# Patient Record
Sex: Female | Born: 1965 | Race: Black or African American | Hispanic: No | Smoking: Never smoker
Health system: Southern US, Community
[De-identification: ages and names within clinical notes are randomized; demographics above are authoritative.]

## PROBLEM LIST (undated history)

## (undated) DIAGNOSIS — E669 Obesity, unspecified: Secondary | ICD-10-CM

## (undated) DIAGNOSIS — D150 Benign neoplasm of thymus: Secondary | ICD-10-CM

## (undated) DIAGNOSIS — J40 Bronchitis, not specified as acute or chronic: Secondary | ICD-10-CM

## (undated) DIAGNOSIS — I10 Essential (primary) hypertension: Secondary | ICD-10-CM

## (undated) DIAGNOSIS — D573 Sickle-cell trait: Secondary | ICD-10-CM

## (undated) DIAGNOSIS — I509 Heart failure, unspecified: Secondary | ICD-10-CM

## (undated) DIAGNOSIS — E7800 Pure hypercholesterolemia, unspecified: Secondary | ICD-10-CM

## (undated) DIAGNOSIS — D72829 Elevated white blood cell count, unspecified: Secondary | ICD-10-CM

## (undated) DIAGNOSIS — D649 Anemia, unspecified: Secondary | ICD-10-CM

## (undated) DIAGNOSIS — D4989 Neoplasm of unspecified behavior of other specified sites: Secondary | ICD-10-CM

## (undated) HISTORY — DX: Sickle-cell trait: D57.3

## (undated) HISTORY — PX: TUBAL LIGATION: SHX77

## (undated) HISTORY — DX: Elevated white blood cell count, unspecified: D72.829

## (undated) HISTORY — DX: Neoplasm of unspecified behavior of other specified sites: D49.89

## (undated) HISTORY — DX: Obesity, unspecified: E66.9

## (undated) HISTORY — DX: Anemia, unspecified: D64.9

## (undated) HISTORY — DX: Essential (primary) hypertension: I10

## (undated) HISTORY — DX: Benign neoplasm of thymus: D15.0

## (undated) HISTORY — PX: TONSILLECTOMY: SUR1361

---

## 1997-06-12 ENCOUNTER — Encounter: Admission: RE | Admit: 1997-06-12 | Discharge: 1997-06-12 | Payer: Self-pay | Admitting: *Deleted

## 1997-09-08 ENCOUNTER — Inpatient Hospital Stay (HOSPITAL_COMMUNITY): Admission: AD | Admit: 1997-09-08 | Discharge: 1997-09-08 | Payer: Self-pay | Admitting: Obstetrics

## 1998-01-05 ENCOUNTER — Inpatient Hospital Stay (HOSPITAL_COMMUNITY): Admission: AD | Admit: 1998-01-05 | Discharge: 1998-01-05 | Payer: Self-pay | Admitting: Obstetrics

## 2011-04-16 ENCOUNTER — Encounter (HOSPITAL_COMMUNITY): Payer: Self-pay | Admitting: *Deleted

## 2011-04-16 ENCOUNTER — Other Ambulatory Visit: Payer: Self-pay

## 2011-04-16 ENCOUNTER — Emergency Department (HOSPITAL_COMMUNITY)
Admission: EM | Admit: 2011-04-16 | Discharge: 2011-04-16 | Disposition: A | Discharge status: Home / Self Care | Payer: Self-pay | Source: Home / Self Care | Attending: Emergency Medicine | Admitting: Emergency Medicine

## 2011-04-16 DIAGNOSIS — I498 Other specified cardiac arrhythmias: Secondary | ICD-10-CM

## 2011-04-16 DIAGNOSIS — J069 Acute upper respiratory infection, unspecified: Secondary | ICD-10-CM

## 2011-04-16 DIAGNOSIS — R000 Tachycardia, unspecified: Secondary | ICD-10-CM

## 2011-04-16 HISTORY — DX: Pure hypercholesterolemia, unspecified: E78.00

## 2011-04-16 MED ORDER — GUAIFENESIN-CODEINE 100-10 MG/5ML PO SYRP: 5.0000 mL | ORAL_SOLUTION | Freq: Three times a day (TID) | ORAL | Status: AC | PRN

## 2011-04-16 NOTE — Discharge Instructions (Signed)
If your respiratory symptoms persist beyond 7 days return for recheck or followup with her primary care Dr. would encourage you to recheck your pulse as instructed today and if still above 110 followup with your primary care doctor in the near future. If any chest pain dizziness palpitation should go to the emergency department for further evaluation

## 2011-04-16 NOTE — ED Provider Notes (Signed)
History     CSN: [id number]  Arrival date & time 04/16/11  4098   First MD Initiated Contact with Patient 04/16/11 [phone]      Chief Complaint  Patient presents with  . Sore Throat  . Cough    (Consider location/radiation/quality/duration/timing/severity/associated sxs/prior treatment) Patient is a 46 y.o. female presenting with pharyngitis and cough. The history is provided by the patient.  Sore Throat This is a new problem. The current episode started 2 days ago. The problem occurs hourly. The problem has not changed since onset.Associated symptoms include headaches. Pertinent negatives include no chest pain, no abdominal pain and no shortness of breath. The symptoms are relieved by nothing. Treatments tried: otc claritin. The treatment provided no relief.  Cough Associated symptoms include headaches. Pertinent negatives include no chest pain, no chills, no shortness of breath and no wheezing.    Past Medical History  Diagnosis Date  . Diabetes mellitus   . High cholesterol     History reviewed. No pertinent past surgical history.  History reviewed. No pertinent family history.  History  Substance Use Topics  . Smoking status: Never Smoker   . Smokeless tobacco: Not on file  . Alcohol Use: No    OB History    Grav Para Term Preterm Abortions TAB SAB Ect Mult Living                  Review of Systems  Constitutional: Positive for appetite change. Negative for chills, activity change and fatigue.  Respiratory: Positive for cough and chest tightness. Negative for shortness of breath and wheezing.   Cardiovascular: Negative for chest pain and palpitations.  Gastrointestinal: Negative for vomiting, abdominal pain, constipation, blood in stool and rectal pain.  Genitourinary: Negative for dysuria.  Neurological: Positive for headaches.    Allergies  Augmentin  Home Medications   Current Outpatient Rx  Name Route Sig Dispense Refill  . FERROUS FUMARATE 325 (106  FE) MG PO TABS Oral Take 1 tablet by mouth.    Marland Kitchen LISINOPRIL 10 MG PO TABS Oral Take 10 mg by mouth daily.    Marland Kitchen METFORMIN HCL 500 MG PO TABS Oral Take 500 mg by mouth 2 (two) times daily with a meal.    . ONE-DAILY MULTI VITAMINS PO TABS Oral Take 1 tablet by mouth daily.    . GUAIFENESIN-CODEINE 100-10 MG/5ML PO SYRP Oral Take 5 mLs by mouth 3 (three) times daily as needed for cough. 120 mL 0    BP 135/64  Pulse 116  Temp(Src) 98.4 F (36.9 C) (Oral)  Resp 20  SpO2 97%  LMP 04/06/2011  Physical Exam  Nursing note and vitals reviewed. Constitutional: She appears well-developed and well-nourished. No distress.  HENT:  Head: Normocephalic.  Right Ear: Tympanic membrane normal.  Left Ear: Tympanic membrane normal.  Nose: Rhinorrhea present.  Mouth/Throat: Uvula is midline. Posterior oropharyngeal erythema present.  Eyes: Conjunctivae and EOM are normal.  Neck: Neck supple. No JVD present.  Cardiovascular: Regular rhythm and S1 normal.   No extrasystoles are present. Tachycardia present.  PMI is not displaced.   Pulmonary/Chest: Effort normal. She has no decreased breath sounds. She has no wheezes.  Lymphadenopathy:    She has no cervical adenopathy.    ED Course  Procedures (including critical care time)  Labs Reviewed - No data to display No results found.   1. Upper respiratory infection   2. Sinus tachycardia       MDM  Patient presents urgent care  with respiratory symptoms for 2 days. Denies fevers or shortness of breath or chest pains incidentally is noted that she is persistently tachycardic between 115 and 120 further evaluation taking place. Sinus epicardium patient denies any palpitations, shortness of breath, presyncopal or chest pains.        Jimmie Molly, MD 04/16/11 1106

## 2011-04-16 NOTE — ED Notes (Signed)
Pt is here with complaints of cough, sore throat, HA and sneezing x 2 days.  Denies fever.

## 2011-07-01 ENCOUNTER — Telehealth: Payer: Self-pay | Admitting: Oncology

## 2011-07-01 NOTE — Telephone Encounter (Signed)
called pts home lmovm to rtn call to scheduled appt

## 2011-07-02 ENCOUNTER — Telehealth: Payer: Self-pay | Admitting: Oncology

## 2011-07-02 NOTE — Telephone Encounter (Signed)
called pt lmovm to rtn call to schedule appt 

## 2011-07-05 ENCOUNTER — Telehealth: Payer: Self-pay | Admitting: Oncology

## 2011-07-05 NOTE — Telephone Encounter (Signed)
called pt and scheduled appt for 07/09. faxed over a letter to Dr. Doyce Loose with appt d/t

## 2011-07-05 NOTE — Telephone Encounter (Signed)
Referred by Dr. Mathis Bud Dx- Leukocytosis/Anemia

## 2011-07-16 ENCOUNTER — Other Ambulatory Visit: Payer: Self-pay | Admitting: Oncology

## 2011-07-16 DIAGNOSIS — D72829 Elevated white blood cell count, unspecified: Secondary | ICD-10-CM

## 2011-07-20 ENCOUNTER — Other Ambulatory Visit (HOSPITAL_COMMUNITY)
Admission: RE | Admit: 2011-07-20 | Discharge: 2011-07-20 | Disposition: A | Discharge status: Home / Self Care | Payer: BC Managed Care – PPO | Source: Ambulatory Visit | Attending: Oncology | Admitting: Oncology

## 2011-07-20 ENCOUNTER — Ambulatory Visit (HOSPITAL_BASED_OUTPATIENT_CLINIC_OR_DEPARTMENT_OTHER): Payer: BC Managed Care – PPO | Admitting: Oncology

## 2011-07-20 ENCOUNTER — Telehealth: Payer: Self-pay | Admitting: Oncology

## 2011-07-20 ENCOUNTER — Ambulatory Visit: Payer: BC Managed Care – PPO

## 2011-07-20 ENCOUNTER — Other Ambulatory Visit (HOSPITAL_BASED_OUTPATIENT_CLINIC_OR_DEPARTMENT_OTHER): Payer: BC Managed Care – PPO | Admitting: Lab

## 2011-07-20 VITALS — BP 113/63 | HR 92 | Temp 97.4°F | Ht 69.0 in | Wt 320.7 lb

## 2011-07-20 DIAGNOSIS — D649 Anemia, unspecified: Secondary | ICD-10-CM

## 2011-07-20 DIAGNOSIS — D72829 Elevated white blood cell count, unspecified: Secondary | ICD-10-CM

## 2011-07-20 DIAGNOSIS — E119 Type 2 diabetes mellitus without complications: Secondary | ICD-10-CM

## 2011-07-20 DIAGNOSIS — D72820 Lymphocytosis (symptomatic): Secondary | ICD-10-CM

## 2011-07-20 DIAGNOSIS — R718 Other abnormality of red blood cells: Secondary | ICD-10-CM

## 2011-07-20 LAB — CHCC SMEAR

## 2011-07-20 LAB — CBC WITH DIFFERENTIAL/PLATELET
Basophils Absolute: 0.1 10*3/uL (ref 0.0–0.1)
Eosinophils Absolute: 0.1 10*3/uL (ref 0.0–0.5)
HCT: 35.6 % (ref 34.8–46.6)
HGB: 11.6 g/dL (ref 11.6–15.9)
MONO#: 0.6 10*3/uL (ref 0.1–0.9)
NEUT%: 42 % (ref 38.4–76.8)
WBC: 16.1 10*3/uL — ABNORMAL HIGH (ref 3.9–10.3)
lymph#: 8.5 10*3/uL — ABNORMAL HIGH (ref 0.9–3.3)

## 2011-07-20 LAB — COMPREHENSIVE METABOLIC PANEL
ALT: 20 U/L (ref 0–35)
BUN: 16 mg/dL (ref 6–23)
CO2: 28 mEq/L (ref 19–32)
Calcium: 9.2 mg/dL (ref 8.4–10.5)
Chloride: 104 mEq/L (ref 96–112)
Creatinine, Ser: 0.75 mg/dL (ref 0.50–1.10)

## 2011-07-20 LAB — TECHNOLOGIST REVIEW

## 2011-07-20 MED ORDER — FERROUS FUMARATE 325 (106 FE) MG PO TABS: 1.0000 | ORAL_TABLET | Freq: Every day | ORAL | Status: DC

## 2011-07-20 NOTE — Progress Notes (Signed)
CC:   Joselyn Glassman, MD  REASON FOR CONSULTATION:  Leukocytosis and anemia.  HISTORY OF PRESENT ILLNESS:  Ms. Constantino is a pleasant 46 year old African American woman with past medical history significant for diabetes and hyperlipidemia as well as obesity.  She has been diagnosed with iron deficiency anemia since her 1st pregnancy and had been on and off an iron supplementation.  She has stopped taking it for the last 4 months. I am not sure exactly why.  She reports that she went in to refill her iron and there was no refill and she was told that she does not really need it.  She did report some constipation associated with the iron tablets but otherwise did not really have any major problems.  She does not have any major symptoms at this time.  She has had recently laboratory data checked as part of her routine followup with her primary care physician, and she was noted to have white cell count of 14.5.  Her hemoglobin was 12.  Her MCV is 77, which is low.  Her differential at that time showed she had a lymphocytosis of 58% but otherwise the differential was unremarkable.  Looking back previously in January of 2013 her white cell count was 14.6.  In February of 2012 it was 13.1 with a lymphocyte percentage of 45.  The patient otherwise had not reported any illness or hospitalization.  She did have sinus pressure and infection requiring antibiotics but no steroid intake.  She did not have any pneumonia, did not have any bronchitis, did not have any colitis.  REVIEW OF SYSTEMS:  Not reported any headaches, blurry vision, double vision.  Not reported any motor or sensory neuropathy.  Not reported any alteration in mental status.  Not reported any psychiatric issues, depression.  Not reported any fever, chills, sweats.  Not reported any cough, hemoptysis, hematemesis.  No nausea, vomiting, abdominal pain, hematochezia, melena, genitourinary complaints.  Rest of review of systems  unremarkable.  PAST MEDICAL HISTORY:  Significant for type 2 diabetes, history of hyperlipidemia, history of obesity, history of hypertension, recent hospitalization for pneumonia, also sickle cell trait by her report.  MEDICATIONS:  She was on iron sulfate.  She was on lisinopril, metformin and multivitamin.  ALLERGIES:  She is allergic to amoxicillin.  FAMILY HISTORY:  Diabetes runs in her family.  Her father had heart problems.  No history of anemia or any blood disorders or any malignancy.  SOCIAL HISTORY:  She is married.  She has 3 children.  Denied any smoking or alcohol abuse.  Worked as a Production designer, theatre/television/film at [employer].  PHYSICAL EXAMINATION:  General:  Alert, awake, pleasant woman appeared in no active distress.  Vital signs:  Her blood pressure is 113/63, pulse 92, respiration 20, temperature is 97.  HEENT:  Head is normocephalic, atraumatic.  Pupils equal, round, reactive to light. Oral mucosa moist and pink.  Neck:  Supple without adenopathy.  Heart: Regular rate and rhythm.  S1, S2.  Lungs:  Clear to auscultation without rhonchi, wheeze, dullness to percussion.  Abdomen:  Soft, nontender.  No hepatosplenomegaly.  Extremities:  No clubbing, cyanosis or edema. Neurological:  Intact motor, sensory and deep tendon reflexes.  LABORATORY DATA:  Today showed a hemoglobin of 11.6, white cell count of 16.1, her MCV 74, RDW of 18.1, her lymphocyte percentage of 52.6%, absolute lymphocyte number is 8500, upper limit of normal is 3.3.  ASSESSMENT AND PLAN:  A 46 year old woman with the following issues: 1. Leukocytosis and  lymphocytosis.  The peripheral smear was     personally reviewed today and did show some increased lymphocytes     and some variant lymphs.  The differential diagnosis at this point     would include a chronic B cell lymphocytosis versus a condition     such as chronic lymphocytic leukemia versus chronic prolymphocytic     leukemia, could also be a leukemic phase of an  indolent lymphoma,     could also be reactive in nature.  To work this up I will obtain a     peripheral blood flow cytometry, consider CT scans and a bone     marrow biopsy if the flow cytometry suggests a monoclonal etiology.     At this time she is asymptomatic.  I do not think there is any     evidence to suggest acute leukemia and certainly that is not the     case based on her peripheral smear and her history.  I will recheck     her blood counts in about 4 months. 2. A microcytosis and possible anemia.  She could have an element of     iron deficiency anemia as evident by her RDW, mild anemia and she     has heavy menstrual cycles in the past.  I have instructed her to     restart her iron and we will recheck her iron stores in 4 months.     All her questions were answered.  Final recommendation pending her     undergoing workup.    ______________________________ Benjiman Core, M.D. FNS/MEDQ  D:  07/20/2011  T:  07/20/2011  Job:  [id number]

## 2011-07-20 NOTE — Telephone Encounter (Signed)
appt made and printed for pt aom °

## 2011-07-20 NOTE — Progress Notes (Signed)
Note dictated

## 2011-09-11 DIAGNOSIS — B351 Tinea unguium: Secondary | ICD-10-CM | POA: Insufficient documentation

## 2011-09-11 DIAGNOSIS — N644 Mastodynia: Secondary | ICD-10-CM | POA: Insufficient documentation

## 2011-09-11 DIAGNOSIS — E559 Vitamin D deficiency, unspecified: Secondary | ICD-10-CM | POA: Insufficient documentation

## 2011-09-11 DIAGNOSIS — M79673 Pain in unspecified foot: Secondary | ICD-10-CM | POA: Insufficient documentation

## 2011-09-11 DIAGNOSIS — H538 Other visual disturbances: Secondary | ICD-10-CM | POA: Insufficient documentation

## 2011-09-11 HISTORY — DX: Morbid (severe) obesity due to excess calories: E66.01

## 2011-11-16 ENCOUNTER — Other Ambulatory Visit: Payer: BC Managed Care – PPO

## 2011-11-16 ENCOUNTER — Ambulatory Visit: Payer: BC Managed Care – PPO | Admitting: Oncology

## 2011-12-05 ENCOUNTER — Emergency Department (HOSPITAL_COMMUNITY)
Admission: EM | Admit: 2011-12-05 | Discharge: 2011-12-05 | Disposition: A | Discharge status: Home / Self Care | Payer: BC Managed Care – PPO | Attending: Emergency Medicine | Admitting: Emergency Medicine

## 2011-12-05 ENCOUNTER — Encounter (HOSPITAL_COMMUNITY): Payer: Self-pay | Admitting: Emergency Medicine

## 2011-12-05 DIAGNOSIS — M79606 Pain in leg, unspecified: Secondary | ICD-10-CM

## 2011-12-05 DIAGNOSIS — M79609 Pain in unspecified limb: Secondary | ICD-10-CM | POA: Insufficient documentation

## 2011-12-05 DIAGNOSIS — Z79899 Other long term (current) drug therapy: Secondary | ICD-10-CM | POA: Insufficient documentation

## 2011-12-05 DIAGNOSIS — E119 Type 2 diabetes mellitus without complications: Secondary | ICD-10-CM | POA: Insufficient documentation

## 2011-12-05 DIAGNOSIS — E7800 Pure hypercholesterolemia, unspecified: Secondary | ICD-10-CM | POA: Insufficient documentation

## 2011-12-05 MED ORDER — INSULIN REGULAR HUMAN 100 UNIT/ML IJ SOLN: INTRAMUSCULAR | Status: DC

## 2011-12-05 MED ORDER — HYDROCODONE-ACETAMINOPHEN 5-325 MG PO TABS: 1.0000 | ORAL_TABLET | Freq: Four times a day (QID) | ORAL | Status: DC | PRN

## 2011-12-05 NOTE — ED Notes (Signed)
Pt presents w/ right lower leg pain onset 5 days ago. Pain has gotten progressively worse and is interfering w/ the pt's ability to work (she is the Scientist, research (medical) at [employer]). Heel of foot is now tender to touch, rx'ed w/ ibuprofen w/o any improvement.

## 2011-12-05 NOTE — Progress Notes (Signed)
VASCULAR LAB PRELIMINARY  PRELIMINARY  PRELIMINARY  PRELIMINARY  Right lower extremity venous Doppler completed.    Preliminary report:  There is no obvious evidence of  DVT or SVT noted in the right lower extremity.  Tajai Suder, 12/05/2011, 5:39 PM

## 2011-12-05 NOTE — ED Provider Notes (Signed)
History     CSN: [id number]  Arrival date & time 12/05/11  1256   First MD Initiated Contact with Patient 12/05/11 1353      Chief Complaint  Patient presents with  . Leg Pain    (Consider location/radiation/quality/duration/timing/severity/associated sxs/prior treatment) HPI Comments: Kaitlin [name] presents ambulatory for evaluation of right lower leg pain.  She denies any trauma or event that led to the pain.  She reports persistent throbbing behind her right lower leg.  It extends from just distal to the knee down towards the ankle.  She denies any swelling, fever, CP, palpitations, SOB, recent immobilization, or hx of blood clots.  Patient is a 46 y.o. female presenting with leg pain. The history is provided by the patient. No language interpreter was used.  Leg Pain  The incident occurred more than 2 days ago (started on Tuesday, 11/19). The incident occurred at work. There was no injury mechanism. Pain location: right lower leg. The quality of the pain is described as aching and throbbing. The pain is moderate. The pain has been constant since onset. Pertinent negatives include no numbness, no inability to bear weight, no loss of motion, no muscle weakness, no loss of sensation and no tingling. She reports no foreign bodies present. Nothing aggravates the symptoms. She has tried nothing for the symptoms. The treatment provided no relief.    Past Medical History  Diagnosis Date  . Diabetes mellitus   . High cholesterol     Past Surgical History  Procedure Date  . Tonsillectomy     No family history on file.  History  Substance Use Topics  . Smoking status: Never Smoker   . Smokeless tobacco: Never Used  . Alcohol Use: No    OB History    Grav Para Term Preterm Abortions TAB SAB Ect Mult Living                  Review of Systems  Neurological: Negative for tingling and numbness.  All other systems reviewed and are negative.    Allergies  Amoxicillin-pot  clavulanate  Home Medications   Current Outpatient Rx  Name  Route  Sig  Dispense  Refill  . FERROUS FUMARATE 325 (106 FE) MG PO TABS   Oral   Take 1 tablet (106 mg of iron total) by mouth daily.   30 each   3   . IBUPROFEN 200 MG PO TABS   Oral   Take 800 mg by mouth every 8 (eight) hours as needed. For pain.         Marland Kitchen LISINOPRIL 10 MG PO TABS   Oral   Take 10 mg by mouth daily.         Marland Kitchen METFORMIN HCL 500 MG PO TABS   Oral   Take 500 mg by mouth 2 (two) times daily with a meal.         . ADULT MULTIVITAMIN W/MINERALS CH   Oral   Take 1 tablet by mouth daily.         Marland Kitchen SIMVASTATIN 40 MG PO TABS   Oral   Take 40 mg by mouth at bedtime.         Marland Kitchen VITAMIN C 500 MG PO TABS   Oral   Take 500 mg by mouth daily.           BP 140/77  Pulse 96  Temp 97.2 F (36.2 C) (Oral)  Resp 18  SpO2 97%  LMP 11/02/2011  Physical Exam  Nursing note and vitals reviewed. Constitutional: She is oriented to person, place, and time. She appears well-developed and well-nourished. No distress.  HENT:  Head: Normocephalic and atraumatic.  Right Ear: External ear normal.  Left Ear: External ear normal.  Nose: Nose normal.  Mouth/Throat: Oropharynx is clear and moist. No oropharyngeal exudate.  Eyes: Conjunctivae normal are normal. Pupils are equal, round, and reactive to light.  Neck: Normal range of motion. Neck supple. No JVD present. No tracheal deviation present.  Cardiovascular: Normal rate, regular rhythm, normal heart sounds and intact distal pulses.  Exam reveals no gallop and no friction rub.   No murmur heard. Pulmonary/Chest: Effort normal and breath sounds normal. No stridor. No respiratory distress. She has no wheezes. She has no rales. She exhibits no tenderness.  Abdominal: Soft. Bowel sounds are normal. She exhibits no distension and no mass. There is no tenderness. There is no rebound and no guarding.  Musculoskeletal: Normal range of motion. She exhibits  tenderness. She exhibits no edema.       Right lower leg: She exhibits tenderness. She exhibits no bony tenderness, no swelling, no edema, no deformity and no laceration.       Legs:      Note diffuse tenderness starting at the right distal popliteal fossa and extending downwards midway to the foot.  Note no swelling or obvious size asymmetry.  Note no skin changes.  Note intact DP pulse with nl capillary refill.  Not tenseness encountered on palpation of the compartments of the lower leg.  Lymphadenopathy:    She has no cervical adenopathy.  Neurological: She is alert and oriented to person, place, and time.  Skin: Skin is warm and dry. No rash noted. She is not diaphoretic. No erythema. No pallor.  Psychiatric: She has a normal mood and affect. Her behavior is normal.    ED Course  Procedures (including critical care time)  Labs Reviewed - No data to display No results found.   No diagnosis found.    MDM  Pt presents for evaluation of lower leg pain.  She appears comfortable, note stable vital signs.  Although she denies risk factors for a thromboembolic event, will obtain an U/S of the leg to assess for DVT.  Pt signed over to Dr. Lynelle Doctor with the ultrasound still pending.        Tobin Chad, MD 12/05/11 [phone]

## 2011-12-05 NOTE — ED Notes (Signed)
Patient given discharge instructions, information, prescriptions, and diet order. Patient states that they adequately understand discharge information given and to return to ED if symptoms return or worsen.     

## 2011-12-05 NOTE — ED Provider Notes (Signed)
VASCULAR LAB  PRELIMINARY PRELIMINARY PRELIMINARY PRELIMINARY  Right lower extremity venous Doppler completed.  Preliminary report: There is no obvious evidence of DVT or SVT noted in the right lower extremity.  KANADY, CANDACE,  12/05/2011, 5:39 PM    Doppler negative for DVT.    Celene Kras, MD 12/05/11 [phone]

## 2012-01-18 ENCOUNTER — Other Ambulatory Visit: Payer: Self-pay | Admitting: *Deleted

## 2012-01-18 ENCOUNTER — Telehealth: Payer: Self-pay | Admitting: Oncology

## 2012-01-18 DIAGNOSIS — D72820 Lymphocytosis (symptomatic): Secondary | ICD-10-CM

## 2012-01-18 DIAGNOSIS — D649 Anemia, unspecified: Secondary | ICD-10-CM

## 2012-01-18 MED ORDER — FERROUS FUMARATE 325 (106 FE) MG PO TABS: 1.0000 | ORAL_TABLET | Freq: Every day | ORAL | Status: DC

## 2012-01-18 NOTE — Progress Notes (Signed)
Script [facility] and not printed as charted

## 2012-01-18 NOTE — Telephone Encounter (Signed)
Cancel [facility] refill request and e scribe to [facility].

## 2012-01-18 NOTE — Telephone Encounter (Signed)
Called pt left message regarding appt on 02/15/12/ lab and md

## 2012-02-14 ENCOUNTER — Telehealth: Payer: Self-pay | Admitting: Oncology

## 2012-02-14 NOTE — Telephone Encounter (Signed)
Pt was on [employer] schedule by mistake , r/s appt to MD called pt and she wants to come  in [place] due to work, pt will see MD on 3/19 lab and MD per pt rqs

## 2012-02-15 ENCOUNTER — Ambulatory Visit: Payer: BC Managed Care – PPO | Admitting: Nurse Practitioner

## 2012-02-15 ENCOUNTER — Other Ambulatory Visit: Payer: BC Managed Care – PPO | Admitting: Lab

## 2012-02-15 ENCOUNTER — Ambulatory Visit: Payer: BC Managed Care – PPO | Admitting: Oncology

## 2012-02-18 ENCOUNTER — Telehealth: Payer: Self-pay | Admitting: *Deleted

## 2012-02-18 NOTE — Telephone Encounter (Signed)
VERBAL ORDER AND READ BACK TO DR.SHADAD- MAY CHANGE TO FERROCITE 325MG . NOTIFIED PHARMACY.

## 2012-03-29 ENCOUNTER — Ambulatory Visit (HOSPITAL_BASED_OUTPATIENT_CLINIC_OR_DEPARTMENT_OTHER): Payer: BC Managed Care – PPO | Admitting: Oncology

## 2012-03-29 ENCOUNTER — Other Ambulatory Visit (HOSPITAL_BASED_OUTPATIENT_CLINIC_OR_DEPARTMENT_OTHER): Payer: BC Managed Care – PPO | Admitting: Lab

## 2012-03-29 VITALS — BP 200/80 | HR 64 | Temp 97.3°F | Resp 18 | Wt 347.1 lb

## 2012-03-29 DIAGNOSIS — D649 Anemia, unspecified: Secondary | ICD-10-CM

## 2012-03-29 DIAGNOSIS — D509 Iron deficiency anemia, unspecified: Secondary | ICD-10-CM

## 2012-03-29 DIAGNOSIS — I10 Essential (primary) hypertension: Secondary | ICD-10-CM

## 2012-03-29 DIAGNOSIS — D72820 Lymphocytosis (symptomatic): Secondary | ICD-10-CM

## 2012-03-29 DIAGNOSIS — D72829 Elevated white blood cell count, unspecified: Secondary | ICD-10-CM

## 2012-03-29 LAB — COMPREHENSIVE METABOLIC PANEL (CC13)
AST: 23 U/L (ref 5–34)
BUN: 14.1 mg/dL (ref 7.0–26.0)
CO2: 25 mEq/L (ref 22–29)
Calcium: 8.9 mg/dL (ref 8.4–10.4)
Chloride: 108 mEq/L — ABNORMAL HIGH (ref 98–107)
Creatinine: 1.1 mg/dL (ref 0.6–1.1)
Glucose: 240 mg/dl — ABNORMAL HIGH (ref 70–99)

## 2012-03-29 LAB — CBC WITH DIFFERENTIAL/PLATELET
BASO%: 0.5 % (ref 0.0–2.0)
LYMPH%: 54.7 % — ABNORMAL HIGH (ref 14.0–49.7)
MCHC: 33.1 g/dL (ref 31.5–36.0)
MONO#: 0.8 10*3/uL (ref 0.1–0.9)
Platelets: 257 10*3/uL (ref 145–400)
RBC: 4.66 10*6/uL (ref 3.70–5.45)
WBC: 18.7 10*3/uL — ABNORMAL HIGH (ref 3.9–10.3)

## 2012-03-29 LAB — TECHNOLOGIST REVIEW

## 2012-03-29 LAB — IRON AND TIBC
%SAT: 12 % — ABNORMAL LOW (ref 20–55)
Iron: 41 ug/dL — ABNORMAL LOW (ref 42–145)

## 2012-03-29 NOTE — Progress Notes (Signed)
Hematology and Oncology Follow Up Visit  Kaitlin [name] [id number] 08-22-1965 47 y.o. 03/29/2012 10:14 AM   Principle Diagnosis: 47 year old with iron deficiency anemia and lymphocytosis diagnosed in 07/2011.    Current therapy: She is on Oral iron supplement once a day.   Interim History: Kaitlin [name] presents today for a follow up visit. She is a nice women I saw for the first time on 07/2011 for the evaluation of anemia and lymphocytosis. Her work up reveled normal flow cytometry and high RDW and microcytosis. Since her last visit, she has been doing well and have tolerated oral iron supplement. SHe had not reported any illness or hospitalization. No chest pain and headaches.    Medications: I have reviewed the patient's current medications. Current outpatient prescriptions:ferrous fumarate (HEMOCYTE - 106 MG FE) 325 (106 FE) MG TABS, Take 1 tablet (106 mg of iron total) by mouth daily. PLEASE MAKE FOLLOW UP APPOINTMENT WITH DR. Zana Biancardi, Disp: 30 each, Rfl: 3;  HYDROcodone-acetaminophen (NORCO) 5-325 MG per tablet, Take 1-2 tablets by mouth every 6 (six) hours as needed for pain., Disp: 20 tablet, Rfl: 0 ibuprofen (ADVIL,MOTRIN) 200 MG tablet, Take 800 mg by mouth every 8 (eight) hours as needed. For pain., Disp: , Rfl: ;  insulin regular (HUMULIN R) 100 units/mL injection, Continue your current regimen, Disp: 10 mL, Rfl: 2;  insulin regular (NOVOLIN R,HUMULIN R) 100 units/mL injection, Inject into the skin 3 (three) times daily before meals., Disp: , Rfl: ;  lisinopril (PRINIVIL,ZESTRIL) 10 MG tablet, Take 10 mg by mouth daily., Disp: , Rfl:  metFORMIN (GLUCOPHAGE) 500 MG tablet, Take 500 mg by mouth 2 (two) times daily with a meal., Disp: , Rfl: ;  Multiple Vitamin (MULTIVITAMIN WITH MINERALS) TABS, Take 1 tablet by mouth daily., Disp: , Rfl: ;  simvastatin (ZOCOR) 40 MG tablet, Take 40 mg by mouth at bedtime., Disp: , Rfl: ;  vitamin C (ASCORBIC ACID) 500 MG tablet, Take 500 mg by mouth daily.,  Disp: , Rfl:   Allergies:  Allergies  Allergen Reactions  . Amoxicillin-Pot Clavulanate     Past Medical History, Surgical history, Social history, and Family History were reviewed and updated.  Review of Systems: Constitutional:  Negative for fever, chills, night sweats, anorexia, weight loss, pain. Cardiovascular: negative Respiratory: negative Neurological: negative Dermatological: negative ENT: negative Skin: Negative. Gastrointestinal: negative Genito-Urinary: negative Hematological and Lymphatic: negative Breast: negative Musculoskeletal: negative Remaining ROS negative. Physical Exam: Temperature 97.3 F (36.3 C), temperature source Oral, resp. rate 18, weight 347 lb 2 oz (157.455 kg). ECOG: 1 General appearance: alert Head: Normocephalic, without obvious abnormality, atraumatic Neck: no adenopathy, no carotid bruit, no JVD, supple, symmetrical, trachea midline and thyroid not enlarged, symmetric, no tenderness/mass/nodules Lymph nodes: Cervical, supraclavicular, and axillary nodes normal. Heart:regular rate and rhythm, S1, S2 normal, no murmur, click, rub or gallop Lung:chest clear, no wheezing, rales, normal symmetric air entry Abdomin: soft, non-tender, without masses or organomegaly EXT:no erythema, induration, or nodules   Lab Results: Lab Results  Component Value Date   WBC 18.7* 03/29/2012   HGB 12.1 03/29/2012   HCT 36.7 03/29/2012   MCV 78.8* 03/29/2012   PLT 257 03/29/2012     Chemistry      Component Value Date/Time   NA 138 07/20/2011 1032   K 4.0 07/20/2011 1032   CL 104 07/20/2011 1032   CO2 28 07/20/2011 1032   BUN 16 07/20/2011 1032   CREATININE 0.75 07/20/2011 1032      Component  Value Date/Time   CALCIUM 9.2 07/20/2011 1032   ALKPHOS 80 07/20/2011 1032   AST 19 07/20/2011 1032   ALT 20 07/20/2011 1032   BILITOT 0.4 07/20/2011 1032        Impression and Plan:  47 year old woman with the following issues: 1. Leukocytosis and lymphocytosis. The  differential diagnosis at this point would include a chronic B cell lymphocytosis versus a condition  such as chronic lymphocytic leukemia versus chronic prolymphocytic leukemia, could also be a leukemic phase of an indolent lymphoma. These are unlikely at this point given that her peripheral blood flow cytometry failed to show any clonality.   This could also be reactive in nature. I would recommend continued observation for now. We will consider CT scan or a bone marrow biopsy in the future if any other abnormalities noted.    2. A microcytosis.. This improved with Fe supplement. I continued to encouraged her to take Fe supplement.   3. Hypertension: I advised her to get evaluated by her PCP regarding that.     [facility], MD 3/19/201410:14 AM

## 2012-03-31 ENCOUNTER — Telehealth: Payer: Self-pay | Admitting: Oncology

## 2012-03-31 NOTE — Telephone Encounter (Signed)
lvm for pt for Sept appt...Marland KitchenMarland Kitchen

## 2012-10-02 ENCOUNTER — Ambulatory Visit: Payer: BC Managed Care – PPO | Admitting: Oncology

## 2012-10-02 ENCOUNTER — Other Ambulatory Visit: Payer: BC Managed Care – PPO | Admitting: Lab

## 2012-10-03 ENCOUNTER — Other Ambulatory Visit: Payer: Self-pay | Admitting: Oncology

## 2012-10-13 ENCOUNTER — Other Ambulatory Visit: Payer: Self-pay | Admitting: *Deleted

## 2012-10-13 MED ORDER — FERROUS FUMARATE 325 (106 FE) MG PO TABS: ORAL_TABLET | ORAL | Status: DC

## 2012-10-17 ENCOUNTER — Emergency Department (HOSPITAL_COMMUNITY)
Admission: EM | Admit: 2012-10-17 | Discharge: 2012-10-17 | Disposition: A | Discharge status: Home / Self Care | Payer: BC Managed Care – PPO | Attending: Emergency Medicine | Admitting: Emergency Medicine

## 2012-10-17 ENCOUNTER — Emergency Department (HOSPITAL_COMMUNITY): Payer: BC Managed Care – PPO

## 2012-10-17 ENCOUNTER — Encounter (HOSPITAL_COMMUNITY): Payer: Self-pay

## 2012-10-17 DIAGNOSIS — J069 Acute upper respiratory infection, unspecified: Secondary | ICD-10-CM | POA: Insufficient documentation

## 2012-10-17 DIAGNOSIS — R222 Localized swelling, mass and lump, trunk: Secondary | ICD-10-CM | POA: Insufficient documentation

## 2012-10-17 DIAGNOSIS — E119 Type 2 diabetes mellitus without complications: Secondary | ICD-10-CM | POA: Insufficient documentation

## 2012-10-17 DIAGNOSIS — R51 Headache: Secondary | ICD-10-CM | POA: Insufficient documentation

## 2012-10-17 DIAGNOSIS — J9859 Other diseases of mediastinum, not elsewhere classified: Secondary | ICD-10-CM

## 2012-10-17 DIAGNOSIS — E7800 Pure hypercholesterolemia, unspecified: Secondary | ICD-10-CM | POA: Insufficient documentation

## 2012-10-17 DIAGNOSIS — J3489 Other specified disorders of nose and nasal sinuses: Secondary | ICD-10-CM | POA: Insufficient documentation

## 2012-10-17 DIAGNOSIS — Z794 Long term (current) use of insulin: Secondary | ICD-10-CM | POA: Insufficient documentation

## 2012-10-17 DIAGNOSIS — Z880 Allergy status to penicillin: Secondary | ICD-10-CM | POA: Insufficient documentation

## 2012-10-17 DIAGNOSIS — R079 Chest pain, unspecified: Secondary | ICD-10-CM | POA: Insufficient documentation

## 2012-10-17 DIAGNOSIS — Z79899 Other long term (current) drug therapy: Secondary | ICD-10-CM | POA: Insufficient documentation

## 2012-10-17 DIAGNOSIS — Z3202 Encounter for pregnancy test, result negative: Secondary | ICD-10-CM | POA: Insufficient documentation

## 2012-10-17 LAB — URINALYSIS, ROUTINE W REFLEX MICROSCOPIC
Glucose, UA: NEGATIVE mg/dL
Hgb urine dipstick: NEGATIVE
Ketones, ur: NEGATIVE mg/dL
Leukocytes, UA: NEGATIVE
Protein, ur: NEGATIVE mg/dL
Urobilinogen, UA: 1 mg/dL (ref 0.0–1.0)
pH: 6 (ref 5.0–8.0)

## 2012-10-17 LAB — COMPREHENSIVE METABOLIC PANEL
ALT: 24 U/L (ref 0–35)
AST: 21 U/L (ref 0–37)
Albumin: 3.4 g/dL — ABNORMAL LOW (ref 3.5–5.2)
Alkaline Phosphatase: 89 U/L (ref 39–117)
BUN: 12 mg/dL (ref 6–23)
Creatinine, Ser: 0.77 mg/dL (ref 0.50–1.10)
GFR calc non Af Amer: >90 mL/min (ref >90)
Potassium: 3.8 mEq/L (ref 3.5–5.1)
Total Bilirubin: 0.3 mg/dL (ref 0.3–1.2)
Total Protein: 6.5 g/dL (ref 6.0–8.3)

## 2012-10-17 LAB — POCT I-STAT TROPONIN I: Troponin i, poc: 0.01 ng/mL (ref 0.00–0.08)

## 2012-10-17 LAB — CBC WITH DIFFERENTIAL/PLATELET
Basophils Absolute: 0 10*3/uL (ref 0.0–0.1)
Eosinophils Absolute: 0.1 10*3/uL (ref 0.0–0.7)
HCT: 36.9 % (ref 36.0–46.0)
Lymphocytes Relative: 46 % (ref 12–46)
MCH: 25.6 pg — ABNORMAL LOW (ref 26.0–34.0)
MCHC: 34.4 g/dL (ref 30.0–36.0)
Neutro Abs: 7.1 10*3/uL (ref 1.7–7.7)
Platelets: 283 10*3/uL (ref 150–400)
RDW: 16.3 % — ABNORMAL HIGH (ref 11.5–15.5)
WBC: 14.5 10*3/uL — ABNORMAL HIGH (ref 4.0–10.5)

## 2012-10-17 LAB — TROPONIN I: Troponin I: <0.3 ng/mL (ref <0.30)

## 2012-10-17 LAB — POCT PREGNANCY, URINE: Preg Test, Ur: NEGATIVE

## 2012-10-17 MED ORDER — IOHEXOL 350 MG/ML SOLN
125.0000 mL | Freq: Once | INTRAVENOUS | Status: AC | PRN
  Administered 2012-10-17: 125 mL via INTRAVENOUS

## 2012-10-17 NOTE — ED Notes (Signed)
Pt c/o productive cough, congestion, audible wheezing, SOB on exertion x2days

## 2012-10-17 NOTE — Progress Notes (Signed)
   CARE MANAGEMENT ED NOTE 10/17/2012  Patient:  Kaitlin [name], Kaitlin [name]   Account Number:  [id number]  Date Initiated:  10/17/2012  Documentation initiated by:  Edd Arbour  Subjective/Objective Assessment:   47 yr old female [place] patient c/o sob, cougth , fever, chest pain, congestion NO pcp listed in EPIC     Subjective/Objective Assessment Detail:     Action/Plan:   spoke with pt who confirms pcp as [facility] [place] [facility] [place] EPIC updated   Action/Plan Detail:   Anticipated DC Date:       Status Recommendation to Physician:   Result of Recommendation:    Other ED Services  Consult Working Plan    DC Planning Services  Outpatient Services - Pt will follow up  Other  PCP issues    Choice offered to / List presented to:            Status of service:  Completed, signed off  ED Comments:   ED Comments Detail:

## 2012-10-17 NOTE — ED Provider Notes (Signed)
CSN: [id number]     Arrival date & time 10/17/12  1308 History   First MD Initiated Contact with Patient 10/17/12 [phone]     Chief Complaint  Patient presents with  . Nasal Congestion  . Cough   (Consider location/radiation/quality/duration/timing/severity/associated sxs/prior Treatment) Patient is a 47 y.o. female presenting with shortness of breath.  Shortness of Breath Severity:  Moderate Onset quality:  Gradual Duration:  1 day Timing:  Constant Progression:  Unchanged Chronicity:  New Context: URI and weather changes   Relieved by:  Nothing Worsened by:  Nothing tried Associated symptoms: chest pain (short lived tightness during a coughing fit.), cough, headaches and sputum production   Associated symptoms: no abdominal pain, no fever and no vomiting   Cough:    Cough characteristics:  Productive   Sputum characteristics:  White   Past Medical History  Diagnosis Date  . Diabetes mellitus   . High cholesterol    Past Surgical History  Procedure Laterality Date  . Tonsillectomy     No family history on file. History  Substance Use Topics  . Smoking status: Never Smoker   . Smokeless tobacco: Never Used  . Alcohol Use: No   OB History   Grav Para Term Preterm Abortions TAB SAB Ect Mult Living                 Review of Systems  Constitutional: Negative for fever.  HENT: Negative for congestion.   Respiratory: Positive for cough, sputum production and shortness of breath.   Cardiovascular: Positive for chest pain (short lived tightness during a coughing fit.).  Gastrointestinal: Negative for nausea, vomiting, abdominal pain and diarrhea.  Neurological: Positive for headaches.  All other systems reviewed and are negative.    Allergies  Amoxicillin-pot clavulanate  Home Medications   Current Outpatient Rx  Name  Route  Sig  Dispense  Refill  . ibuprofen (ADVIL,MOTRIN) 200 MG tablet   Oral   Take 800 mg by mouth every 8 (eight) hours as needed. For  pain.         Marland Kitchen insulin regular (HUMULIN R) 100 units/mL injection      Continue your current regimen   10 mL   2   . insulin regular (NOVOLIN R,HUMULIN R) 100 units/mL injection   Subcutaneous   Inject into the skin 3 (three) times daily before meals.         Marland Kitchen lisinopril (PRINIVIL,ZESTRIL) 10 MG tablet   Oral   Take 10 mg by mouth daily.         . metFORMIN (GLUCOPHAGE) 500 MG tablet   Oral   Take 500 mg by mouth 2 (two) times daily with a meal.         . Multiple Vitamin (MULTIVITAMIN WITH MINERALS) TABS   Oral   Take 1 tablet by mouth daily.         . simvastatin (ZOCOR) 40 MG tablet   Oral   Take 40 mg by mouth at bedtime.         . vitamin C (ASCORBIC ACID) 500 MG tablet   Oral   Take 500 mg by mouth daily.          BP 132/81  Pulse 111  Temp(Src) 98.7 F (37.1 C) (Oral)  Resp 20  SpO2 96%  LMP 06/17/2012 Physical Exam  Nursing note and vitals reviewed. Constitutional: She is oriented to person, place, and time. She appears well-developed and well-nourished. No distress.  HENT:  Head:  Normocephalic and atraumatic.  Mouth/Throat: Oropharynx is clear and moist.  Eyes: Conjunctivae are normal. Pupils are equal, round, and reactive to light. No scleral icterus.  Neck: Neck supple.  Cardiovascular: Normal rate, regular rhythm, normal heart sounds and intact distal pulses.   No murmur heard. Pulmonary/Chest: Effort normal and breath sounds normal. No stridor. No respiratory distress. She has no rales.  Abdominal: Soft. Bowel sounds are normal. She exhibits no distension. There is no tenderness.  Musculoskeletal: Normal range of motion. She exhibits no edema and no tenderness.  Neurological: She is alert and oriented to person, place, and time.  Skin: Skin is warm and dry. No rash noted.  Psychiatric: She has a normal mood and affect. Her behavior is normal.    ED Course  Procedures (including critical care time) Labs Review Labs Reviewed   CBC WITH DIFFERENTIAL - Abnormal; Notable for the following:    WBC 14.5 (*)    MCV 74.2 (*)    MCH 25.6 (*)    RDW 16.3 (*)    Lymphs Abs 6.7 (*)    All other components within normal limits  COMPREHENSIVE METABOLIC PANEL - Abnormal; Notable for the following:    Glucose, Bld 66 (*)    Albumin 3.4 (*)    All other components within normal limits  PRO B NATRIURETIC PEPTIDE - Abnormal; Notable for the following:    Pro B Natriuretic peptide (BNP) 298.5 (*)    All other components within normal limits  URINALYSIS, ROUTINE W REFLEX MICROSCOPIC  TROPONIN I  POCT PREGNANCY, URINE   Imaging Review Dg Chest 2 View  10/17/2012   CLINICAL DATA:  Nasal congestion. Cough and shortness of breath. Weakness and hypertension.  EXAM: CHEST  2 VIEW  COMPARISON:  None.  FINDINGS: Lobulated masslike opacity is superimposed on the right hilum. This may reflect a central mass, adenopathy or a combination.  No mediastinal masses. The left hilum is unremarkable. The cardiac silhouette is normal in size and configuration.  Small area of opacity noted in the anterior right middle lobe, likely atelectasis. The lungs are otherwise clear. No pleural effusion or pneumothorax.  The bony thorax is intact.  IMPRESSION: 1. Right hilar region, lobulated masslike fullness. Recommend followup chest CT with contrast. 2. No acute cardiopulmonary disease.   Electronically Signed   By: Amie Portland M.D.   On: 10/17/2012 10:03   Ct Angio Chest Pe W/cm &/or Wo Cm  10/17/2012   *RADIOLOGY REPORT*  Clinical Data: Abnormal chest radiograph, shortness of breath, congestion, chest tightness, evaluate for pulmonary embolism, initial encounter.  CT ANGIOGRAPHY CHEST  Technique:  Multidetector CT imaging of the chest using the standard protocol during bolus administration of intravenous contrast. Multiplanar reconstructed images including MIPs were obtained and reviewed to evaluate the vascular anatomy.  Contrast: OMNIPAQUE IOHEXOL  350 MG/ML SOLN  Comparison: Chest radiograph - earlier same day  Vascular Findings:  There is suboptimal opacification of the pulmonary arterial system with the main pulmonary artery measuring only 191 HU.  There are no discrete filling defects within the central pulmonary arterial tree to suggest central pulmonary embolism.  Evaluation of the distal segmental and distal subsegmental pulmonary arteries is degraded due to a combination of suboptimal vessel opacification, respiratory artifact and patient body habitus.  Normal-caliber of the main pulmonary artery.  Borderline cardiomegaly.  Calcifications of the mitral valve annulus.  No pericardial effusion.  Normal caliber of the thoracic aorta.  Conventional configuration of the aortic arch.  No  definite periaortic stranding.  -------------------------------------------------------------  Nonvascular findings:  Evaluation of the pulmonary parenchyma is degraded due to patient respiratory artifact.  There is an approximately 9.6 x 7.8 x 8.0 cm (as measured in greatest oblique axial and cranial caudal dimensions respectively - axial image 25, series 6; coronal image 49, series 9) anterior mediastinal mass which is noted to contain an internal dystrophic calcification (images 24 - 26, series six). This mass appears separate from a mild diffusely enlarged but otherwise normal appearing thyroid gland.  This mass is without definitive associated mediastinal or hilar lymphadenopathy with index pretracheal lymph node measuring 8 mm in diameter (image 70, series 12).  There are shoddy bilateral axillary lymph nodes with index right axillary node measuring 8 mm in short axis diameter (image 31, series 6) and index left axillary lymph node measuring approximately 1.1 cm in diameter (image 40, series 12).  There is minimal dependent ground-glass atelectasis.  No focal airspace opacity. No discrete pulmonary nodules.  No pleural effusion or pneumothorax.  The central pulmonary  airways are widely patent.  Early arterial phase evaluation of the upper abdomen demonstrates borderline enlargement of the spleen measuring approximately 12.6 cm in length and approximately 6.9 cm in diameter.  No acute or aggressive osseous abnormalities.  There is mild multilevel DDD throughout the thoracic spine, worse at T5 - T6, T7 - T8 and T8 - T9 with disc space height loss, end plate irregularity and small posteriorly directed disc osteophyte complexes at these levels.  IMPRESSION:  1.  No central pulmonary embolism.  Evaluation the segmental and distal subsegmental pulmonary arteries is degraded due to a combination of suboptimal vessel opacification, patient body habitus and patient respiratory artifact. Further evaluation with bilateral venous Doppler ultrasound may be performed as clinically indicated.  2.  Large, approximately 9.6 cm anterior mediastinal mass correlates with the findings on recently performed chest radiograph.  While this mass is indeterminate, differential considerations include lymphoma, teratoma or a lesion of thymic etiology. Further evaluation with a PET scan may be performed as clinically indicated.  3.  Borderline splenomegaly and shoddy nonspecific axillary lymph nodes with a solitary left axillary lymph node which approaches enlargement by size criteria. Given the above described anterior mediastinal mass, lymphomatous involvement is not excluded.  4.  Multilevel thoracic spine degenerative change.   Original Report Authenticated By: Tacey Ruiz, MD  All radiology studies independently viewed by me.     EKG 1 - NSR, rate 89, LAD, normal intervals, baseline wander in inferior leads, no significant ST/T changes, compared to prior axis moved leftward.   EKG 2 - NSR, rate 91, LAD, normal intervals, no ST/T changes, no significant changes from prior.    MDM   1. Acute URI   2. Mediastinal mass    47 yo female with a few days of nasal congestion and cough, worse last  night.  Now with shortness of breath on exertion.  Symptoms and exam most consistent with URI.  However, she also reports mild orthopnea and recently being started on lasix for BLE swelling (none present now).  Therefore, will check CXR, BNP and EKG.  Story not consistent with ACS.    CXR showed a mediastinal mass.  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  She follows with Dr. Clelia Croft for lymphocytosis.  I have referred her back for further evaluation of this mass.  Meanwhile, pt states she feels well.  Her SOB  has resolved.    Candyce Churn, MD 10/17/12 [phone]

## 2012-10-18 ENCOUNTER — Other Ambulatory Visit: Payer: Self-pay | Admitting: Oncology

## 2012-10-18 ENCOUNTER — Telehealth: Payer: Self-pay | Admitting: *Deleted

## 2012-10-18 NOTE — Telephone Encounter (Signed)
Pt called [facility] states " I need an appt with Dr. Clelia Croft because I went to the ED yesterday and the Dr. Malvin Johns a mass in my chest." Reviewed with MD, notified pt appt with MD 10/10 at 4pm

## 2012-10-20 ENCOUNTER — Telehealth: Payer: Self-pay | Admitting: Oncology

## 2012-10-20 ENCOUNTER — Ambulatory Visit (HOSPITAL_BASED_OUTPATIENT_CLINIC_OR_DEPARTMENT_OTHER): Payer: BC Managed Care – PPO | Admitting: Oncology

## 2012-10-20 VITALS — BP 136/74 | HR 105 | Temp 98.6°F | Resp 21 | Ht 69.0 in | Wt 344.5 lb

## 2012-10-20 DIAGNOSIS — R718 Other abnormality of red blood cells: Secondary | ICD-10-CM

## 2012-10-20 DIAGNOSIS — R0989 Other specified symptoms and signs involving the circulatory and respiratory systems: Secondary | ICD-10-CM

## 2012-10-20 DIAGNOSIS — R222 Localized swelling, mass and lump, trunk: Secondary | ICD-10-CM

## 2012-10-20 MED ORDER — GUAIFENESIN-CODEINE 100-10 MG/5ML PO SOLN: 5.0000 mL | Freq: Three times a day (TID) | ORAL | Status: DC | PRN

## 2012-10-20 NOTE — Telephone Encounter (Signed)
Gave pt appt for Md visit October faxed referral to [facility], notified [facility] and informed her of the referral

## 2012-10-20 NOTE — Progress Notes (Signed)
Hematology and Oncology Follow Up Visit  Kaitlin [name] [id number] [birth year] 47 y.o. 10/20/2012 4:44 PM   Principle Diagnosis: 47 year old with iron deficiency anemia and lymphocytosis diagnosed in 07/2011. Now has a new finding as a chest mass.   Current therapy: She is on Oral iron supplement once a day.   Interim History: Kaitlin [name] presents today for a follow up visit. She is a nice women I saw for the first time on 07/2011 for the evaluation of anemia and lymphocytosis. Her work up reveled normal flow cytometry and high RDW and microcytosis. Since her last visit, she developed an acute episode of chest pain, shortness of breath and congestion. This episode started last week and was evaluated in the emergency department on 10/17/2012. Upon their evaluation, she had a CT scan of the chest which did not show any evidence of a pulmonary emboli but did reveal a large anterior mediastinal chest mass. Patient was asked to followup with me regarding that new finding. She is not reporting any shortness of breath or chest pain but continues to have congestion and cough that is not productive. She is not reporting any headaches or dizziness. She has low-grade fevers of 100 at the most. Is not reporting any pruritus or any other constitutional symptoms.  Medications: I have reviewed the patient's current medications.  Current Outpatient Prescriptions  Medication Sig Dispense Refill  . cetirizine (ZYRTEC) 10 MG tablet Take 10 mg by mouth daily as needed for allergies.      . ferrous fumarate (HEMOCYTE - 106 MG FE) 325 (106 FE) MG TABS tablet Take 1 tablet by mouth daily.      . furosemide (LASIX) 20 MG tablet Take 10 mg by mouth daily.      Marland Kitchen guaiFENesin-codeine 100-10 MG/5ML syrup Take 5 mLs by mouth 3 (three) times daily as needed for cough.  120 mL  0  . ibuprofen (ADVIL,MOTRIN) 200 MG tablet Take 600 mg by mouth every 8 (eight) hours as needed. For pain.      Marland Kitchen insulin glargine (LANTUS) 100  UNIT/ML injection Inject 30 Units into the skin at bedtime.      . insulin regular (NOVOLIN R,HUMULIN R) 100 units/mL injection Inject into the skin 3 (three) times daily before meals.      Marland Kitchen lisinopril (PRINIVIL,ZESTRIL) 10 MG tablet Take 10 mg by mouth daily.      . metFORMIN (GLUCOPHAGE) 500 MG tablet Take 500 mg by mouth 2 (two) times daily with a meal.      . Multiple Vitamin (MULTIVITAMIN WITH MINERALS) TABS Take 1 tablet by mouth daily.      . Multiple Vitamins-Minerals (IMMUNE SUPPORT VITAMIN C) PACK Take 1 Package by mouth daily.      . simvastatin (ZOCOR) 40 MG tablet Take 40 mg by mouth at bedtime.       No current facility-administered medications for this visit.    Allergies:  Allergies  Allergen Reactions  . Amoxicillin-Pot Clavulanate Diarrhea    Past Medical History, Surgical history, Social history, and Family History were reviewed and updated.  Review of Systems:  Remaining ROS negative. Physical Exam: Blood pressure 136/74, pulse 105, temperature 98.6 F (37 C), temperature source Oral, resp. rate 21, height 5\' 9"  (1.753 m), weight 344 lb 8 oz (156.264 kg), last menstrual period 06/17/2012, SpO2 100.00%. ECOG: 1 General appearance: alert Head: Normocephalic, without obvious abnormality, atraumatic Neck: no adenopathy, no carotid bruit, no JVD, supple, symmetrical, trachea midline and thyroid not  enlarged, symmetric, no tenderness/mass/nodules Lymph nodes: Cervical, supraclavicular, and axillary nodes normal. Heart:regular rate and rhythm, S1, S2 normal, no murmur, click, rub or gallop Lung:chest clear, no wheezing, rales, normal symmetric air entry Abdomin: soft, non-tender, without masses or organomegaly EXT:no erythema, induration, or nodules   Lab Results: Lab Results  Component Value Date   WBC 14.5* 10/17/2012   HGB 12.7 10/17/2012   HCT 36.9 10/17/2012   MCV 74.2* 10/17/2012   PLT 283 10/17/2012     Chemistry      Component Value Date/Time   NA 144  10/17/2012 0930   NA 141 03/29/2012 0945   K 3.8 10/17/2012 0930   K 4.1 03/29/2012 0945   CL 108 10/17/2012 0930   CL 108* 03/29/2012 0945   CO2 25 10/17/2012 0930   CO2 25 03/29/2012 0945   BUN 12 10/17/2012 0930   BUN 14.1 03/29/2012 0945   CREATININE 0.77 10/17/2012 0930   CREATININE 1.1 03/29/2012 0945      Component Value Date/Time   CALCIUM 9.3 10/17/2012 0930   CALCIUM 8.9 03/29/2012 0945   ALKPHOS 89 10/17/2012 0930   ALKPHOS 90 03/29/2012 0945   AST 21 10/17/2012 0930   AST 23 03/29/2012 0945   ALT 24 10/17/2012 0930   ALT 31 03/29/2012 0945   BILITOT 0.3 10/17/2012 0930   BILITOT 0.23 03/29/2012 0945     IMPRESSION:  1. No central pulmonary embolism. Evaluation the segmental and  distal subsegmental pulmonary arteries is degraded due to a  combination of suboptimal vessel opacification, patient body  habitus and patient respiratory artifact. Further evaluation with  bilateral venous Doppler ultrasound may be performed as clinically  indicated.  2. Large, approximately 9.6 cm anterior mediastinal mass  correlates with the findings on recently performed chest  radiograph. While this mass is indeterminate, differential  considerations include lymphoma, teratoma or a lesion of thymic  etiology. Further evaluation with a PET scan may be performed as  clinically indicated.  3. Borderline splenomegaly and shoddy nonspecific axillary lymph  nodes with a solitary left axillary lymph node which approaches  enlargement by size criteria. Given the above described anterior  mediastinal mass, lymphomatous involvement is not excluded.  4. Multilevel thoracic spine degenerative change.     Impression and Plan:  47 year old woman with the following issues: 1. New finding of a large anterior mediastinal mass measuring 9.6 cm. The CT scan findings were discussed today as well as the differential diagnosis. Given her age the most likely diagnosis is probably Hodgkin's disease but also she could  have a teratoma, non-Hodgkin's lymphoma, and other findings. To work this up completely, I will obtain a PET CT scan as well as a referral to thoracic surgery for a biopsy. Upon the completion of the biopsy we will determine the treatment accordingly.  2. A microcytosis: likely due to mild iron deficiency anemia. This appears to be mild in nature.  3. Cough and congestion: she is quite symptomatic from this and I have given her a prescription guaifenesin with codeine for symptomatic relief. I've also given her clear obstruction to report to the emergency department as soon as possible if she develops shortness of breath or difficulty breathing.    Eli Hose, MD 10/10/20144:44 PM

## 2012-10-24 ENCOUNTER — Encounter: Payer: Self-pay | Admitting: Oncology

## 2012-10-24 NOTE — Progress Notes (Signed)
Put fmla form on nurse's desk °

## 2012-10-25 ENCOUNTER — Telehealth: Payer: Self-pay | Admitting: *Deleted

## 2012-10-25 MED ORDER — PSEUDOEPHEDRINE HCL 30 MG PO TABS: 30.0000 mg | ORAL_TABLET | ORAL | Status: DC | PRN

## 2012-10-25 NOTE — Telephone Encounter (Signed)
Patient c/o congestion, per dr Clelia Croft, sudafed called to [facility].

## 2012-10-25 NOTE — Addendum Note (Signed)
Addended by: Benjiman Core on: 10/25/2012 12:09 PM   Modules accepted: Orders

## 2012-10-30 ENCOUNTER — Encounter: Payer: Self-pay | Admitting: Oncology

## 2012-10-30 ENCOUNTER — Emergency Department (HOSPITAL_COMMUNITY): Payer: BC Managed Care – PPO

## 2012-10-30 ENCOUNTER — Telehealth: Payer: Self-pay | Admitting: *Deleted

## 2012-10-30 ENCOUNTER — Encounter (HOSPITAL_COMMUNITY): Payer: Self-pay | Admitting: Emergency Medicine

## 2012-10-30 ENCOUNTER — Emergency Department (HOSPITAL_COMMUNITY)
Admission: EM | Admit: 2012-10-30 | Discharge: 2012-10-30 | Disposition: A | Discharge status: Home / Self Care | Payer: BC Managed Care – PPO | Attending: Emergency Medicine | Admitting: Emergency Medicine

## 2012-10-30 DIAGNOSIS — D573 Sickle-cell trait: Secondary | ICD-10-CM | POA: Insufficient documentation

## 2012-10-30 DIAGNOSIS — R0602 Shortness of breath: Secondary | ICD-10-CM | POA: Insufficient documentation

## 2012-10-30 DIAGNOSIS — J45909 Unspecified asthma, uncomplicated: Secondary | ICD-10-CM | POA: Insufficient documentation

## 2012-10-30 DIAGNOSIS — Z79899 Other long term (current) drug therapy: Secondary | ICD-10-CM | POA: Insufficient documentation

## 2012-10-30 DIAGNOSIS — E7800 Pure hypercholesterolemia, unspecified: Secondary | ICD-10-CM | POA: Insufficient documentation

## 2012-10-30 DIAGNOSIS — Z8701 Personal history of pneumonia (recurrent): Secondary | ICD-10-CM | POA: Insufficient documentation

## 2012-10-30 DIAGNOSIS — J069 Acute upper respiratory infection, unspecified: Secondary | ICD-10-CM

## 2012-10-30 DIAGNOSIS — E119 Type 2 diabetes mellitus without complications: Secondary | ICD-10-CM | POA: Insufficient documentation

## 2012-10-30 DIAGNOSIS — J3489 Other specified disorders of nose and nasal sinuses: Secondary | ICD-10-CM | POA: Insufficient documentation

## 2012-10-30 DIAGNOSIS — Z794 Long term (current) use of insulin: Secondary | ICD-10-CM | POA: Insufficient documentation

## 2012-10-30 MED ORDER — AZITHROMYCIN 250 MG PO TABS
ORAL_TABLET | ORAL | Status: DC
Start: 2012-10-30 — End: 2012-11-10

## 2012-10-30 MED ORDER — ALBUTEROL SULFATE HFA 108 (90 BASE) MCG/ACT IN AERS: 2.0000 | INHALATION_SPRAY | RESPIRATORY_TRACT | Status: DC | PRN

## 2012-10-30 MED ORDER — HYDROCODONE-HOMATROPINE 5-1.5 MG/5ML PO SYRP: 2.5000 mL | ORAL_SOLUTION | Freq: Four times a day (QID) | ORAL | Status: DC | PRN

## 2012-10-30 NOTE — Telephone Encounter (Addendum)
THE GUAIFENESIN WITH CODEINE AND SUDAFED IS NOT HELPING. PT. IS REQUESTING AN ANTIBIOTIC. SHE DOES NOT HAVE A FEVER. HER MUCUS IS CLEAR WITH OCCASIONAL YELLOW COLORING. TWO TO THREE TIMES A DAY PT. SUDDENLY HAS SHORTNESS OF BREATH AND CHEST PAIN RANGING FROM A SIX TO EIGHT ON THE PAIN SCALE. THIS HAPPENED EARLY THIS MORNING AND LASTED TEN TO FIFTEEN MINUTES. THIS NOTE TO DR.MAGRINAT.

## 2012-10-30 NOTE — Telephone Encounter (Signed)
LEFT PT. A MESSAGE ON HER MOBILE VOICE MAIL CONCERNING HER FMLA PAPERS. SHE SHOULD BE ABLE TO PICK UP FMLA PAPERS AFTER HER VISIT WITH DR.SHADAD ON 11/03/12.

## 2012-10-30 NOTE — ED Provider Notes (Signed)
CSN: [id number]     Arrival date & time 10/30/12  1744 History  This chart was scribed for non-physician practitioner Arthor Captain, PA-C working with Shon Baton, MD by Valera Castle, ED scribe. This patient was seen in room WTR5/WTR5 and the patient's care was started at 6:57 PM.    Chief Complaint  Patient presents with  . Cough  . Nasal Congestion    The history is provided by the patient. No language interpreter was used.   HPI Comments: Kaitlin [name] is a 47 y.o. female who presents to the Emergency Department complaining of sudden, moderate, intermittent cough, productive of yellow sputum, as well as associated nasal congestion, onset a few days ago. She reports that when the cough starts she cannot stop. She reports SOB when she starts coughing. She reports that her cough has been keeping her up at night. She reports running a fever 2 weeks ago, but it has since resolved. She reports eating and drinking normally. She denies current fever, and any other associated symptoms.    Past Medical History  Diagnosis Date  . Diabetes mellitus   . High cholesterol    Past Surgical History  Procedure Laterality Date  . Tonsillectomy     No family history on file. History  Substance Use Topics  . Smoking status: Never Smoker   . Smokeless tobacco: Never Used  . Alcohol Use: No   OB History   Grav Para Term Preterm Abortions TAB SAB Ect Mult Living                 Review of Systems  Constitutional: Negative for fever and appetite change.  HENT: Positive for congestion (nasal).   Respiratory: Positive for cough and shortness of breath (when coughing).   All other systems reviewed and are negative.   Allergies  Amoxicillin-pot clavulanate  Home Medications   Current Outpatient Rx  Name  Route  Sig  Dispense  Refill  . cetirizine (ZYRTEC) 10 MG tablet   Oral   Take 10 mg by mouth daily as needed for allergies.         . ferrous fumarate (HEMOCYTE - 106 MG FE)  325 (106 FE) MG TABS tablet   Oral   Take 1 tablet by mouth daily.         . furosemide (LASIX) 20 MG tablet   Oral   Take 10 mg by mouth daily.         Marland Kitchen guaiFENesin-codeine 100-10 MG/5ML syrup   Oral   Take 5 mLs by mouth 3 (three) times daily as needed for cough.   120 mL   0   . ibuprofen (ADVIL,MOTRIN) 200 MG tablet   Oral   Take 600 mg by mouth every 8 (eight) hours as needed. For pain.         Marland Kitchen insulin glargine (LANTUS) 100 UNIT/ML injection   Subcutaneous   Inject 30 Units into the skin at bedtime.         . insulin regular (NOVOLIN R,HUMULIN R) 100 units/mL injection   Subcutaneous   Inject into the skin 3 (three) times daily before meals.         Marland Kitchen lisinopril (PRINIVIL,ZESTRIL) 10 MG tablet   Oral   Take 10 mg by mouth daily.         Marland Kitchen menthol-cetylpyridinium (CEPACOL) 3 MG lozenge   Oral   Take 1 lozenge by mouth as needed for pain (COUGH).         Marland Kitchen  metFORMIN (GLUCOPHAGE) 500 MG tablet   Oral   Take 500 mg by mouth 2 (two) times daily with a meal.         . Multiple Vitamin (MULTIVITAMIN WITH MINERALS) TABS   Oral   Take 1 tablet by mouth daily.         . Multiple Vitamins-Minerals (IMMUNE SUPPORT VITAMIN C) PACK   Oral   Take 1 Package by mouth daily.         . pseudoephedrine (SUDAFED) 30 MG tablet   Oral   Take 1 tablet (30 mg total) by mouth every 4 (four) hours as needed for congestion.   30 tablet   1   . simvastatin (ZOCOR) 40 MG tablet   Oral   Take 40 mg by mouth at bedtime.          Triage Vitals: BP 134/69  Pulse 109  Temp(Src) 98.7 F (37.1 C) (Oral)  Resp 16  SpO2 94%  LMP 06/17/2012  Physical Exam  Nursing note and vitals reviewed. Constitutional: She is oriented to person, place, and time. She appears well-developed and well-nourished. No distress.  HENT:  Head: Normocephalic and atraumatic.  Eyes: EOM are normal.  Neck: Neck supple. No tracheal deviation present.  Cardiovascular: Normal rate,  regular rhythm and normal heart sounds.  Exam reveals no gallop and no friction rub.   No murmur heard. Pulmonary/Chest: Effort normal and breath sounds normal. No respiratory distress. She has no wheezes. She has no rales.  Musculoskeletal: Normal range of motion.  Neurological: She is alert and oriented to person, place, and time.  Skin: Skin is warm and dry.  Psychiatric: She has a normal mood and affect. Her behavior is normal.    ED Course  Procedures (including critical care time)  DIAGNOSTIC STUDIES: Oxygen Saturation is 94% on room air, adequate by my interpretation.    COORDINATION OF CARE: 7:00 PM-Discussed treatment plan which includes CXR with pt at bedside and pt agreed to plan.   Labs Review Labs Reviewed - No data to display Imaging Review Dg Chest 2 View  10/30/2012   CLINICAL DATA:  Short of breath  EXAM: CHEST  2 VIEW  COMPARISON:  Chest x-ray and CT chest 10/17/2012  FINDINGS: Soft tissue mass overlying the right hilum consistent with a mediastinal mass as noted on the CT. See separate CT report for findings and differential.  No acute infiltrate or effusion. Negative for heart failure. No change from prior chest x-ray.  IMPRESSION: Anterior mediastinal mass is unchanged. This may represent a teratoma, thymoma, or lymphoma.  No superimposed acute abnormality compared to the prior study.   Electronically Signed   By: Marlan Palau M.D.   On: 10/30/2012 20:11    EKG Interpretation   None      Meds ordered this encounter  Medications  . menthol-cetylpyridinium (CEPACOL) 3 MG lozenge    Sig: Take 1 lozenge by mouth as needed for pain (COUGH).    MDM   1. URI (upper respiratory infection)   2. RAD (reactive airway disease)     8:14 PM -  Filed Vitals:   10/30/12 1752  BP: 134/69  Pulse: 109  Temp: 98.7 F (37.1 C)  TempSrc: Oral  Resp: 16  SpO2: 94%   Pt seen two weeks ago, lobulated hilar mass seen. Pt has f/u with oncology. Review of previous  xray of 10-7 shows opacity of right middle lobe. Thought to be atelectasis at time. Will repeat xray today  to rule out pneumonia. Think she has component of reactive airway.  Opacity form previous xray resolved. No acute changes. I personally reviewed images from today and previous using our PACS system. Patient will be d/c with albuterol hycodan. F/u with pcp  . I personally performed the services described in this documentation, which was scribed in my presence. The recorded information has been reviewed and is accurate.    Arthor Captain, PA-C 11/03/12 1137

## 2012-10-30 NOTE — Progress Notes (Signed)
Put fmla form in registration desk °

## 2012-10-30 NOTE — Telephone Encounter (Signed)
VERBAL ORDER AND READ BACK TO DR.MAGRINAT-PT. NEEDS TO GO TO THE EMERGENCY ROOM TO BE EVALUATED. NOTIFIED PT. SHE VOICES UNDERSTANDING.

## 2012-10-30 NOTE — ED Notes (Signed)
Pt c/o of cough non productive with nasal congestion. Denies pain.

## 2012-10-31 DIAGNOSIS — D649 Anemia, unspecified: Secondary | ICD-10-CM | POA: Insufficient documentation

## 2012-10-31 DIAGNOSIS — I10 Essential (primary) hypertension: Secondary | ICD-10-CM | POA: Insufficient documentation

## 2012-10-31 DIAGNOSIS — E7800 Pure hypercholesterolemia, unspecified: Secondary | ICD-10-CM | POA: Insufficient documentation

## 2012-10-31 DIAGNOSIS — D72829 Elevated white blood cell count, unspecified: Secondary | ICD-10-CM | POA: Insufficient documentation

## 2012-11-01 ENCOUNTER — Other Ambulatory Visit: Payer: Self-pay | Admitting: *Deleted

## 2012-11-01 ENCOUNTER — Institutional Professional Consult (permissible substitution) (INDEPENDENT_AMBULATORY_CARE_PROVIDER_SITE_OTHER): Payer: BC Managed Care – PPO | Admitting: Surgery

## 2012-11-01 ENCOUNTER — Telehealth: Payer: Self-pay | Admitting: *Deleted

## 2012-11-01 ENCOUNTER — Encounter: Payer: Self-pay | Admitting: Surgery

## 2012-11-01 VITALS — BP 126/68 | HR 106 | Resp 20 | Ht 69.0 in | Wt 344.0 lb

## 2012-11-01 DIAGNOSIS — J9859 Other diseases of mediastinum, not elsewhere classified: Secondary | ICD-10-CM

## 2012-11-01 DIAGNOSIS — R222 Localized swelling, mass and lump, trunk: Secondary | ICD-10-CM

## 2012-11-01 DIAGNOSIS — D4989 Neoplasm of unspecified behavior of other specified sites: Secondary | ICD-10-CM | POA: Insufficient documentation

## 2012-11-01 NOTE — Progress Notes (Signed)
PCP is Teodoro Spray, MD Referring Provider is Teodoro Spray, MD  Chief Complaint  Patient presents with  . Lung Mass    Surgical eval on Lung Mass, CTA Chest 10/17/12, PET SCAN scheduled for 11/03/12    HPI:  The patient is a 47 year old woman who was evaluated by Dr. Clelia Croft in July of 2013 for anemia and leukocytosis. She said that her primary care physician had noted an elevated white blood cell count for a while before that. Workup revealed normal flow cytometry and a high RDW and microcytosis. She says that she was in her usual state of health until about 2 weeks ago when she began developing chest pain, shortness of breath, and congestion. She reports a low-grade fever of 99-100 and some night sweats which she thought were due to menopause. She was seen in the emergency room and a CT scan the chest ruled out pulmonary emboli but did show a large anterior mediastinal mass measuring approximately 6 x 10 cm.  Past Medical History  Diagnosis Date  . Diabetes mellitus   . High cholesterol   . Leukocytosis   . Anemia   . Obesity   . Hypertension   . Sickle cell trait     Past Surgical History  Procedure Laterality Date  . Tonsillectomy      Family History  Problem Relation Age of Onset  . Heart disease Father   . Diabetes type II      Family HX    Social History History  Substance Use Topics  . Smoking status: Never Smoker   . Smokeless tobacco: Never Used  . Alcohol Use: No    Current Outpatient Prescriptions  Medication Sig Dispense Refill  . albuterol (PROVENTIL HFA;VENTOLIN HFA) 108 (90 BASE) MCG/ACT inhaler Inhale 2 puffs into the lungs every 4 (four) hours as needed for wheezing.  1 Inhaler  0  . azithromycin (ZITHROMAX Z-PAK) 250 MG tablet 2 po day one, then 1 daily x 4 days  5 tablet  0  . ferrous fumarate (HEMOCYTE - 106 MG FE) 325 (106 FE) MG TABS tablet Take 1 tablet by mouth daily.      . furosemide (LASIX) 20 MG tablet Take 10 mg by mouth daily.       Marland Kitchen HYDROcodone-homatropine (HYCODAN) 5-1.5 MG/5ML syrup Take 2.5 mLs by mouth every 6 (six) hours as needed for cough.  30 mL  0  . ibuprofen (ADVIL,MOTRIN) 200 MG tablet Take 600 mg by mouth every 8 (eight) hours as needed. For pain.      Marland Kitchen insulin glargine (LANTUS) 100 UNIT/ML injection Inject 30 Units into the skin at bedtime.      . insulin regular (NOVOLIN R,HUMULIN R) 100 units/mL injection Inject into the skin 3 (three) times daily before meals.      Marland Kitchen lisinopril (PRINIVIL,ZESTRIL) 10 MG tablet Take 10 mg by mouth daily.      . metFORMIN (GLUCOPHAGE) 500 MG tablet Take 500 mg by mouth 2 (two) times daily with a meal.      . Multiple Vitamin (MULTIVITAMIN WITH MINERALS) TABS Take 1 tablet by mouth daily.      . Multiple Vitamins-Minerals (IMMUNE SUPPORT VITAMIN C) PACK Take 1 Package by mouth daily.      . simvastatin (ZOCOR) 40 MG tablet Take 40 mg by mouth at bedtime.       No current facility-administered medications for this visit.    Allergies  Allergen Reactions  . Amoxicillin-Pot Clavulanate Diarrhea  Review of Systems  Constitutional: Positive for fever, fatigue and unexpected weight change. Negative for diaphoresis.       Recent low grade fever 99 degrees.  Weight gain but says she isn't eating much.  Eyes: Negative.   Respiratory: Positive for cough, chest tightness and shortness of breath.   Cardiovascular: Positive for chest pain and leg swelling. Negative for palpitations.  Gastrointestinal: Negative.   Endocrine: Negative.   Genitourinary: Negative.   Musculoskeletal: Negative.   Skin: Negative.   Allergic/Immunologic: Negative.   Neurological: Positive for headaches.  Hematological: Negative.   Psychiatric/Behavioral: Negative.     BP 126/68  Pulse 106  Resp 20  Ht 5\' 9"  (1.753 m)  Wt 344 lb (156.037 kg)  BMI 50.78 kg/m2  SpO2 96%  LMP 06/17/2012 Physical Exam  Constitutional: She is oriented to person, place, and time.  Morbidly obese woman in  no distress  HENT:  Head: Normocephalic and atraumatic.  Mouth/Throat: Oropharynx is clear and moist.  Eyes: Conjunctivae and EOM are normal. Pupils are equal, round, and reactive to light.  Neck: Normal range of motion. Neck supple. No JVD present. No tracheal deviation present. No thyromegaly present.  No supraclavicular or axillary adenopathy  Cardiovascular: Normal rate, regular rhythm, normal heart sounds and intact distal pulses.  Exam reveals no gallop and no friction rub.   No murmur heard. Pulmonary/Chest: Effort normal. No respiratory distress. She has no wheezes. She has no rales. She exhibits no tenderness.  Abdominal: Soft. Bowel sounds are normal. She exhibits no mass. There is no tenderness.  Musculoskeletal: Normal range of motion. She exhibits edema. She exhibits no tenderness.  In legs  Lymphadenopathy:    She has no cervical adenopathy.  Neurological: She is alert and oriented to person, place, and time. She has normal strength. No cranial nerve deficit or sensory deficit.  Skin: Skin is warm and dry.  Psychiatric: She has a normal mood and affect.     Diagnostic Tests:  *RADIOLOGY REPORT*   Clinical Data: Abnormal chest radiograph, shortness of breath, congestion, chest tightness, evaluate for pulmonary embolism, initial encounter.   CT ANGIOGRAPHY CHEST   Technique:  Multidetector CT imaging of the chest using the standard protocol during bolus administration of intravenous contrast. Multiplanar reconstructed images including MIPs were obtained and reviewed to evaluate the vascular anatomy.   Contrast: OMNIPAQUE IOHEXOL 350 MG/ML SOLN   Comparison: Chest radiograph - earlier same day   Vascular Findings:   There is suboptimal opacification of the pulmonary arterial system with the main pulmonary artery measuring only 191 HU.  There are no discrete filling defects within the central pulmonary arterial tree to suggest central pulmonary embolism.   Evaluation of the distal segmental and distal subsegmental pulmonary arteries is degraded due to a combination of suboptimal vessel opacification, respiratory artifact and patient body habitus.  Normal-caliber of the main pulmonary artery.   Borderline cardiomegaly.  Calcifications of the mitral valve annulus.  No pericardial effusion.   Normal caliber of the thoracic aorta.  Conventional configuration of the aortic arch.  No definite periaortic stranding.   -------------------------------------------------------------   Nonvascular findings:   Evaluation of the pulmonary parenchyma is degraded due to patient respiratory artifact.   There is an approximately 9.6 x 7.8 x 8.0 cm (as measured in greatest oblique axial and cranial caudal dimensions respectively - axial image 25, series 6; coronal image 49, series 9) anterior mediastinal mass which is noted to contain an internal dystrophic calcification (images 24 -  26, series six). This mass appears separate from a mild diffusely enlarged but otherwise normal appearing thyroid gland.  This mass is without definitive associated mediastinal or hilar lymphadenopathy with index pretracheal lymph node measuring 8 mm in diameter (image 70, series 12).  There are shoddy bilateral axillary lymph nodes with index right axillary node measuring 8 mm in short axis diameter (image 31, series 6) and index left axillary lymph node measuring approximately 1.1 cm in diameter (image 40, series 12).   There is minimal dependent ground-glass atelectasis.  No focal airspace opacity. No discrete pulmonary nodules.  No pleural effusion or pneumothorax.  The central pulmonary airways are widely patent.   Early arterial phase evaluation of the upper abdomen demonstrates borderline enlargement of the spleen measuring approximately 12.6 cm in length and approximately 6.9 cm in diameter.   No acute or aggressive osseous abnormalities.  There is  mild multilevel DDD throughout the thoracic spine, worse at T5 - T6, T7 - T8 and T8 - T9 with disc space height loss, end plate irregularity and small posteriorly directed disc osteophyte complexes at these levels.   IMPRESSION:   1.  No central pulmonary embolism.  Evaluation the segmental and distal subsegmental pulmonary arteries is degraded due to a combination of suboptimal vessel opacification, patient body habitus and patient respiratory artifact. Further evaluation with bilateral venous Doppler ultrasound may be performed as clinically indicated.   2.  Large, approximately 9.6 cm anterior mediastinal mass correlates with the findings on recently performed chest radiograph.  While this mass is indeterminate, differential considerations include lymphoma, teratoma or a lesion of thymic etiology. Further evaluation with a PET scan may be performed as clinically indicated.   3.  Borderline splenomegaly and shoddy nonspecific axillary lymph nodes with a solitary left axillary lymph node which approaches enlargement by size criteria. Given the above described anterior mediastinal mass, lymphomatous involvement is not excluded.   4.  Multilevel thoracic spine degenerative change.     Original Report Authenticated By: Tacey Ruiz, MD    Impression:  She has a 6 x 10 cm anterior mediastinal mass that could be a lymphoma, teratoma, or thymic tumor. She is scheduled for a PET scan on Friday. Since there is a high likelihood that this is a lymphoma I think an open biopsy would be best. This could be approached through a right anterior mediastinotomy. A CT guided needle biopsy would most likely not give enough tissue to make a firm diagnosis and allow further workup if this is a lymphoma. I will review her PET scan to be sure there is not some other area of hypermetabolic uptake that would be easier to biopsy. I discussed the operative procedure with the patient and family including  alternatives, benefits and risks; including but not limited to bleeding, blood transfusion, and infection.  Marlana Salvage understands and agrees to proceed.    Plan:  I will followup on her PET scan to be done on Friday. I will plan surgical biopsy by a right anterior mediastinotomy on Monday, 11/06/2012.

## 2012-11-02 ENCOUNTER — Telehealth: Payer: Self-pay | Admitting: *Deleted

## 2012-11-02 NOTE — Telephone Encounter (Signed)
No note

## 2012-11-02 NOTE — Telephone Encounter (Signed)
Faxed office visit notes to [facility] [employer]. [phone]

## 2012-11-03 ENCOUNTER — Other Ambulatory Visit (HOSPITAL_COMMUNITY): Payer: Self-pay | Admitting: *Deleted

## 2012-11-03 ENCOUNTER — Encounter (HOSPITAL_COMMUNITY): Payer: Self-pay | Admitting: *Deleted

## 2012-11-03 ENCOUNTER — Ambulatory Visit (HOSPITAL_BASED_OUTPATIENT_CLINIC_OR_DEPARTMENT_OTHER): Payer: BC Managed Care – PPO | Admitting: Oncology

## 2012-11-03 ENCOUNTER — Encounter (HOSPITAL_COMMUNITY)
Admission: RE | Admit: 2012-11-03 | Discharge: 2012-11-03 | Disposition: A | Discharge status: Home / Self Care | Payer: BC Managed Care – PPO | Source: Ambulatory Visit | Attending: Oncology | Admitting: Oncology

## 2012-11-03 VITALS — BP 119/66 | HR 108 | Temp 97.5°F | Resp 20 | Ht 69.0 in | Wt 345.0 lb

## 2012-11-03 DIAGNOSIS — R0989 Other specified symptoms and signs involving the circulatory and respiratory systems: Secondary | ICD-10-CM

## 2012-11-03 DIAGNOSIS — R7309 Other abnormal glucose: Secondary | ICD-10-CM | POA: Insufficient documentation

## 2012-11-03 DIAGNOSIS — R222 Localized swelling, mass and lump, trunk: Secondary | ICD-10-CM

## 2012-11-03 DIAGNOSIS — D649 Anemia, unspecified: Secondary | ICD-10-CM

## 2012-11-03 DIAGNOSIS — R05 Cough: Secondary | ICD-10-CM

## 2012-11-03 DIAGNOSIS — R059 Cough, unspecified: Secondary | ICD-10-CM

## 2012-11-03 DIAGNOSIS — J9859 Other diseases of mediastinum, not elsewhere classified: Secondary | ICD-10-CM

## 2012-11-03 DIAGNOSIS — R718 Other abnormality of red blood cells: Secondary | ICD-10-CM

## 2012-11-03 LAB — GLUCOSE, CAPILLARY: Glucose-Capillary: 131 mg/dL — ABNORMAL HIGH (ref 70–99)

## 2012-11-03 NOTE — Progress Notes (Signed)
Hematology and Oncology Follow Up Visit  Kaitlin [name] [id number] [birth year] 47 y.o. 11/03/2012 4:20 PM   Principle Diagnosis: 47 year old with iron deficiency anemia and lymphocytosis diagnosed in 07/2011. Now has a new finding as a chest mass.  Current therapy: She is under evaluation for a biopsy and work up ongoing.   Interim History: Kaitlin [name] presents today for a follow up visit.  Since her last visit, she was evaluated by Dr. Laneta Simmers and planning a surgical biopsy on 10/27.  She is not reporting any shortness of breath or chest pain. Her congestion has improved and not reporting any fevers. has She is not reporting any headaches or dizziness. Is not reporting any pruritus or any other constitutional symptoms. She was not able to get the PET CT scan due to elevated blood sugar today.  Medications: I have reviewed the patient's current medications.  Current Outpatient Prescriptions  Medication Sig Dispense Refill  . albuterol (PROVENTIL HFA;VENTOLIN HFA) 108 (90 BASE) MCG/ACT inhaler Inhale 2 puffs into the lungs every 4 (four) hours as needed for wheezing.  1 Inhaler  0  . azithromycin (ZITHROMAX Z-PAK) 250 MG tablet 2 po day one, then 1 daily x 4 days  5 tablet  0  . ferrous fumarate (HEMOCYTE - 106 MG FE) 325 (106 FE) MG TABS tablet Take 1 tablet by mouth daily.      . furosemide (LASIX) 20 MG tablet Take 10 mg by mouth daily.      Marland Kitchen HYDROcodone-homatropine (HYCODAN) 5-1.5 MG/5ML syrup Take 2.5 mLs by mouth every 6 (six) hours as needed for cough.  30 mL  0  . ibuprofen (ADVIL,MOTRIN) 200 MG tablet Take 600 mg by mouth every 8 (eight) hours as needed. For pain.      Marland Kitchen insulin glargine (LANTUS) 100 UNIT/ML injection Inject 30 Units into the skin at bedtime.      . insulin regular (NOVOLIN R,HUMULIN R) 100 units/mL injection Inject into the skin 3 (three) times daily before meals.      Marland Kitchen lisinopril (PRINIVIL,ZESTRIL) 10 MG tablet Take 10 mg by mouth daily.      . metFORMIN (GLUCOPHAGE)  500 MG tablet Take 500 mg by mouth 2 (two) times daily with a meal.      . Multiple Vitamin (MULTIVITAMIN WITH MINERALS) TABS Take 1 tablet by mouth daily.      . Multiple Vitamins-Minerals (IMMUNE SUPPORT VITAMIN C) PACK Take 1 Package by mouth daily.      . simvastatin (ZOCOR) 40 MG tablet Take 40 mg by mouth at bedtime.       No current facility-administered medications for this visit.    Allergies:  Allergies  Allergen Reactions  . Amoxicillin-Pot Clavulanate Diarrhea    Past Medical History, Surgical history, Social history, and Family History were reviewed and updated.  Review of Systems:  Remaining ROS negative. Physical Exam: Blood pressure 119/66, pulse 108, temperature 97.5 F (36.4 C), temperature source Oral, resp. rate 20, height 5\' 9"  (1.753 m), weight 345 lb (156.491 kg), last menstrual period 06/17/2012. ECOG: 1 General appearance: alert Head: Normocephalic, without obvious abnormality, atraumatic Neck: no adenopathy, no carotid bruit, no JVD, supple, symmetrical, trachea midline and thyroid not enlarged, symmetric, no tenderness/mass/nodules Lymph nodes: Cervical, supraclavicular, and axillary nodes normal. Heart:regular rate and rhythm, S1, S2 normal, no murmur, click, rub or gallop Lung:chest clear, no wheezing, rales, normal symmetric air entry Abdomin: soft, non-tender, without masses or organomegaly EXT:no erythema, induration, or nodules   Lab  Results: Lab Results  Component Value Date   WBC 14.5* 10/17/2012   HGB 12.7 10/17/2012   HCT 36.9 10/17/2012   MCV 74.2* 10/17/2012   PLT 283 10/17/2012     Chemistry      Component Value Date/Time   NA 144 10/17/2012 0930   NA 141 03/29/2012 0945   K 3.8 10/17/2012 0930   K 4.1 03/29/2012 0945   CL 108 10/17/2012 0930   CL 108* 03/29/2012 0945   CO2 25 10/17/2012 0930   CO2 25 03/29/2012 0945   BUN 12 10/17/2012 0930   BUN 14.1 03/29/2012 0945   CREATININE 0.77 10/17/2012 0930   CREATININE 1.1 03/29/2012 0945       Component Value Date/Time   CALCIUM 9.3 10/17/2012 0930   CALCIUM 8.9 03/29/2012 0945   ALKPHOS 89 10/17/2012 0930   ALKPHOS 90 03/29/2012 0945   AST 21 10/17/2012 0930   AST 23 03/29/2012 0945   ALT 24 10/17/2012 0930   ALT 31 03/29/2012 0945   BILITOT 0.3 10/17/2012 0930   BILITOT 0.23 03/29/2012 0945        Impression and Plan:  47 year old woman with the following issues: 1. New finding of a large anterior mediastinal mass measuring 9.6 cm. The CT scan findings were discussed today as well as the differential diagnosis. Given her age the most likely diagnosis is probably Hodgkin's disease but also she could have a teratoma, non-Hodgkin's lymphoma, and other findings.  She is scheduled to have a biopsy on 11/06/2012 and a PET CT scan in the near future. Once the results of the pathology has returned we will arrange for a followup and appropriate treatment. If she does have indeed lymphoma she is an excellent candidate for curative attempt of her lymphoma.  I feel that we should not delay her biopsy because of the delay in her PET CT scan which could be done at a later date.  2. A microcytosis: likely due to mild iron deficiency anemia. This appears to be mild in nature.  3. Cough and congestion: Has improved dramatically and feeling a lot better. Likely represents a URI and less likely related to her chest mass.    Eli Hose, MD 10/24/20144:20 PM

## 2012-11-03 NOTE — ED Provider Notes (Signed)
Medical screening examination/treatment/procedure(s) were performed by non-physician practitioner and as supervising physician I was immediately available for consultation/collaboration.  EKG Interpretation   None        Shon Baton, MD 11/03/12 [phone]

## 2012-11-05 MED ORDER — VANCOMYCIN HCL IN DEXTROSE 1-5 GM/200ML-% IV SOLN: 1000.0000 mg | INTRAVENOUS | Status: DC

## 2012-11-05 MED ORDER — VANCOMYCIN HCL 10 G IV SOLR
1500.0000 mg | INTRAVENOUS | Status: AC
  Administered 2012-11-06: 1500 mg via INTRAVENOUS
  Filled 2012-11-05: qty 1500

## 2012-11-06 ENCOUNTER — Inpatient Hospital Stay (HOSPITAL_COMMUNITY)
Admission: RE | Admit: 2012-11-06 | Discharge: 2012-11-10 | DRG: 803 | Disposition: A | Discharge status: Home / Self Care | Payer: BC Managed Care – PPO | Source: Ambulatory Visit | Attending: Surgery | Admitting: Surgery

## 2012-11-06 ENCOUNTER — Inpatient Hospital Stay (HOSPITAL_COMMUNITY): Payer: BC Managed Care – PPO

## 2012-11-06 ENCOUNTER — Encounter (HOSPITAL_COMMUNITY): Payer: Self-pay | Admitting: *Deleted

## 2012-11-06 ENCOUNTER — Encounter (HOSPITAL_COMMUNITY)
Admission: RE | Disposition: A | Discharge status: Home / Self Care | Payer: Self-pay | Source: Ambulatory Visit | Attending: Surgery

## 2012-11-06 ENCOUNTER — Inpatient Hospital Stay (HOSPITAL_COMMUNITY): Payer: BC Managed Care – PPO | Admitting: Anesthesiology

## 2012-11-06 ENCOUNTER — Encounter (HOSPITAL_COMMUNITY): Payer: BC Managed Care – PPO | Admitting: Anesthesiology

## 2012-11-06 DIAGNOSIS — D72829 Elevated white blood cell count, unspecified: Secondary | ICD-10-CM | POA: Diagnosis present

## 2012-11-06 DIAGNOSIS — D649 Anemia, unspecified: Secondary | ICD-10-CM | POA: Diagnosis present

## 2012-11-06 DIAGNOSIS — E119 Type 2 diabetes mellitus without complications: Secondary | ICD-10-CM | POA: Diagnosis present

## 2012-11-06 DIAGNOSIS — Z6841 Body Mass Index (BMI) 40.0 and over, adult: Secondary | ICD-10-CM

## 2012-11-06 DIAGNOSIS — D150 Benign neoplasm of thymus: Principal | ICD-10-CM | POA: Diagnosis present

## 2012-11-06 DIAGNOSIS — I10 Essential (primary) hypertension: Secondary | ICD-10-CM | POA: Diagnosis present

## 2012-11-06 DIAGNOSIS — Z79899 Other long term (current) drug therapy: Secondary | ICD-10-CM

## 2012-11-06 DIAGNOSIS — D573 Sickle-cell trait: Secondary | ICD-10-CM | POA: Diagnosis present

## 2012-11-06 DIAGNOSIS — J9859 Other diseases of mediastinum, not elsewhere classified: Secondary | ICD-10-CM

## 2012-11-06 DIAGNOSIS — R339 Retention of urine, unspecified: Secondary | ICD-10-CM | POA: Diagnosis present

## 2012-11-06 DIAGNOSIS — K5900 Constipation, unspecified: Secondary | ICD-10-CM | POA: Diagnosis present

## 2012-11-06 DIAGNOSIS — E7800 Pure hypercholesterolemia, unspecified: Secondary | ICD-10-CM | POA: Diagnosis present

## 2012-11-06 DIAGNOSIS — Z794 Long term (current) use of insulin: Secondary | ICD-10-CM

## 2012-11-06 DIAGNOSIS — R222 Localized swelling, mass and lump, trunk: Secondary | ICD-10-CM

## 2012-11-06 DIAGNOSIS — Z833 Family history of diabetes mellitus: Secondary | ICD-10-CM

## 2012-11-06 DIAGNOSIS — Z8249 Family history of ischemic heart disease and other diseases of the circulatory system: Secondary | ICD-10-CM

## 2012-11-06 HISTORY — PX: MEDIASTINOTOMY CHAMBERLAIN MCNEIL: SHX5966

## 2012-11-06 HISTORY — DX: Bronchitis, not specified as acute or chronic: J40

## 2012-11-06 LAB — TYPE AND SCREEN

## 2012-11-06 LAB — URINALYSIS, ROUTINE W REFLEX MICROSCOPIC
Glucose, UA: NEGATIVE mg/dL
Hgb urine dipstick: NEGATIVE
Leukocytes, UA: NEGATIVE
Specific Gravity, Urine: 1.024 (ref 1.005–1.030)
Urobilinogen, UA: 1 mg/dL (ref 0.0–1.0)
pH: 5.5 (ref 5.0–8.0)

## 2012-11-06 LAB — COMPREHENSIVE METABOLIC PANEL
ALT: 20 U/L (ref 0–35)
Albumin: 3.5 g/dL (ref 3.5–5.2)
Alkaline Phosphatase: 92 U/L (ref 39–117)
BUN: 16 mg/dL (ref 6–23)
CO2: 26 mEq/L (ref 19–32)
Chloride: 105 mEq/L (ref 96–112)
Creatinine, Ser: 0.78 mg/dL (ref 0.50–1.10)
Potassium: 4.1 mEq/L (ref 3.5–5.1)
Sodium: 141 mEq/L (ref 135–145)
Total Bilirubin: 0.3 mg/dL (ref 0.3–1.2)
Total Protein: 6.5 g/dL (ref 6.0–8.3)

## 2012-11-06 LAB — BLOOD GAS, ARTERIAL
Acid-Base Excess: 1.1 mmol/L (ref 0.0–2.0)
Bicarbonate: 26.6 mEq/L — ABNORMAL HIGH (ref 20.0–24.0)
Drawn by: 32526
O2 Content: 4 L/min
O2 Saturation: 93.9 %
O2 Saturation: 93.1 %
Patient temperature: 98.6
Patient temperature: 98.6
pCO2 arterial: 42.4 mmHg (ref 35.0–45.0)
pH, Arterial: 7.414 (ref 7.350–7.450)

## 2012-11-06 LAB — GLUCOSE, CAPILLARY
Glucose-Capillary: 144 mg/dL — ABNORMAL HIGH (ref 70–99)
Glucose-Capillary: 246 mg/dL — ABNORMAL HIGH (ref 70–99)
Glucose-Capillary: 259 mg/dL — ABNORMAL HIGH (ref 70–99)
Glucose-Capillary: 284 mg/dL — ABNORMAL HIGH (ref 70–99)

## 2012-11-06 LAB — SURGICAL PCR SCREEN
MRSA, PCR: NEGATIVE
Staphylococcus aureus: NEGATIVE

## 2012-11-06 LAB — CBC
HCT: 37.5 % (ref 36.0–46.0)
Hemoglobin: 12.7 g/dL (ref 12.0–15.0)
MCHC: 33.9 g/dL (ref 30.0–36.0)
MCV: 75.5 fL — ABNORMAL LOW (ref 78.0–100.0)
RBC: 4.97 MIL/uL (ref 3.87–5.11)
RDW: 16.3 % — ABNORMAL HIGH (ref 11.5–15.5)
WBC: 17.1 10*3/uL — ABNORMAL HIGH (ref 4.0–10.5)

## 2012-11-06 LAB — ABO/RH: ABO/RH(D): A POS

## 2012-11-06 SURGERY — MEDIASTINOTOMY, CHAMBERLAIN
Anesthesia: General | Site: Chest | Laterality: Right | Wound class: Clean

## 2012-11-06 MED ORDER — NEOSTIGMINE METHYLSULFATE 1 MG/ML IJ SOLN
INTRAMUSCULAR | Status: DC | PRN
  Administered 2012-11-06: 4 mg via INTRAVENOUS

## 2012-11-06 MED ORDER — LIDOCAINE HCL (CARDIAC) 20 MG/ML IV SOLN
INTRAVENOUS | Status: DC | PRN
  Administered 2012-11-06: 100 mg via INTRAVENOUS

## 2012-11-06 MED ORDER — HYDROMORPHONE HCL PF 1 MG/ML IJ SOLN
0.2500 mg | INTRAMUSCULAR | Status: DC | PRN
  Administered 2012-11-06 (×3): 0.5 mg via INTRAVENOUS

## 2012-11-06 MED ORDER — LUNG SURGERY BOOK
Freq: Once | Status: AC
  Administered 2012-11-06: 15:00:00
  Filled 2012-11-06: qty 1

## 2012-11-06 MED ORDER — MORPHINE SULFATE 4 MG/ML IJ SOLN
4.0000 mg | INTRAMUSCULAR | Status: DC | PRN
  Administered 2012-11-06 – 2012-11-07 (×2): 2 mg via INTRAVENOUS
  Administered 2012-11-08 (×2): 4 mg via INTRAVENOUS
  Filled 2012-11-06 (×4): qty 1

## 2012-11-06 MED ORDER — ALBUTEROL SULFATE HFA 108 (90 BASE) MCG/ACT IN AERS
2.0000 | INHALATION_SPRAY | RESPIRATORY_TRACT | Status: DC | PRN
  Filled 2012-11-06: qty 6.7

## 2012-11-06 MED ORDER — SENNOSIDES-DOCUSATE SODIUM 8.6-50 MG PO TABS
1.0000 | ORAL_TABLET | Freq: Every evening | ORAL | Status: DC | PRN
  Filled 2012-11-06: qty 1

## 2012-11-06 MED ORDER — ONDANSETRON HCL 4 MG/2ML IJ SOLN
INTRAMUSCULAR | Status: DC | PRN
  Administered 2012-11-06: 4 mg via INTRAVENOUS

## 2012-11-06 MED ORDER — ROCURONIUM BROMIDE 100 MG/10ML IV SOLN
INTRAVENOUS | Status: DC | PRN
  Administered 2012-11-06: 50 mg via INTRAVENOUS

## 2012-11-06 MED ORDER — GLYCOPYRROLATE 0.2 MG/ML IJ SOLN
INTRAMUSCULAR | Status: DC | PRN
  Administered 2012-11-06: .6 mg via INTRAVENOUS

## 2012-11-06 MED ORDER — HEMOSTATIC AGENTS (NO CHARGE) OPTIME
TOPICAL | Status: DC | PRN
  Administered 2012-11-06: 1 via TOPICAL

## 2012-11-06 MED ORDER — FENTANYL CITRATE 0.05 MG/ML IJ SOLN
INTRAMUSCULAR | Status: DC | PRN
  Administered 2012-11-06: 100 ug via INTRAVENOUS
  Administered 2012-11-06: 150 ug via INTRAVENOUS

## 2012-11-06 MED ORDER — HYDROMORPHONE HCL PF 1 MG/ML IJ SOLN
INTRAMUSCULAR | Status: AC
  Administered 2012-11-06: 0.5 mg via INTRAVENOUS
  Filled 2012-11-06: qty 1

## 2012-11-06 MED ORDER — POTASSIUM CHLORIDE 10 MEQ/50ML IV SOLN: 10.0000 meq | Freq: Every day | INTRAVENOUS | Status: DC | PRN

## 2012-11-06 MED ORDER — 0.9 % SODIUM CHLORIDE (POUR BTL) OPTIME
TOPICAL | Status: DC | PRN
  Administered 2012-11-06: 1000 mL

## 2012-11-06 MED ORDER — FERROUS FUMARATE 325 (106 FE) MG PO TABS
1.0000 | ORAL_TABLET | Freq: Every day | ORAL | Status: DC
  Administered 2012-11-07 – 2012-11-10 (×4): 106 mg via ORAL
  Filled 2012-11-06 (×5): qty 1

## 2012-11-06 MED ORDER — ONDANSETRON HCL 4 MG/2ML IJ SOLN: 4.0000 mg | Freq: Four times a day (QID) | INTRAMUSCULAR | Status: DC | PRN

## 2012-11-06 MED ORDER — KCL IN DEXTROSE-NACL 20-5-0.45 MEQ/L-%-% IV SOLN
INTRAVENOUS | Status: AC
  Filled 2012-11-06: qty 1000

## 2012-11-06 MED ORDER — ACETAMINOPHEN 160 MG/5ML PO SOLN
1000.0000 mg | Freq: Four times a day (QID) | ORAL | Status: AC
  Administered 2012-11-06 – 2012-11-07 (×3): 1000 mg via ORAL
  Filled 2012-11-06 (×4): qty 40

## 2012-11-06 MED ORDER — INSULIN ASPART 100 UNIT/ML ~~LOC~~ SOLN
SUBCUTANEOUS | Status: AC
  Filled 2012-11-06: qty 8

## 2012-11-06 MED ORDER — BISACODYL 5 MG PO TBEC
10.0000 mg | DELAYED_RELEASE_TABLET | Freq: Every day | ORAL | Status: DC
  Administered 2012-11-07 – 2012-11-09 (×3): 10 mg via ORAL
  Filled 2012-11-06 (×4): qty 2

## 2012-11-06 MED ORDER — MIDAZOLAM HCL 5 MG/5ML IJ SOLN
INTRAMUSCULAR | Status: DC | PRN
  Administered 2012-11-06: 2 mg via INTRAVENOUS

## 2012-11-06 MED ORDER — OXYCODONE HCL 5 MG/5ML PO SOLN: 5.0000 mg | Freq: Once | ORAL | Status: DC | PRN

## 2012-11-06 MED ORDER — SIMVASTATIN 40 MG PO TABS
40.0000 mg | ORAL_TABLET | Freq: Every day | ORAL | Status: DC
  Administered 2012-11-06 – 2012-11-09 (×4): 40 mg via ORAL
  Filled 2012-11-06 (×5): qty 1

## 2012-11-06 MED ORDER — VANCOMYCIN HCL IN DEXTROSE 1-5 GM/200ML-% IV SOLN
INTRAVENOUS | Status: AC
  Filled 2012-11-06: qty 200

## 2012-11-06 MED ORDER — INSULIN GLARGINE 100 UNIT/ML ~~LOC~~ SOLN
30.0000 [IU] | Freq: Every day | SUBCUTANEOUS | Status: DC
  Administered 2012-11-07 – 2012-11-09 (×3): 30 [IU] via SUBCUTANEOUS
  Filled 2012-11-06 (×6): qty 0.3

## 2012-11-06 MED ORDER — LISINOPRIL 10 MG PO TABS
10.0000 mg | ORAL_TABLET | Freq: Every day | ORAL | Status: DC
  Administered 2012-11-07 – 2012-11-10 (×3): 10 mg via ORAL
  Filled 2012-11-06 (×5): qty 1

## 2012-11-06 MED ORDER — OXYCODONE HCL 5 MG PO TABS
5.0000 mg | ORAL_TABLET | ORAL | Status: AC | PRN
  Administered 2012-11-06 – 2012-11-07 (×3): 10 mg via ORAL
  Filled 2012-11-06 (×3): qty 2

## 2012-11-06 MED ORDER — INSULIN ASPART 100 UNIT/ML ~~LOC~~ SOLN
0.0000 [IU] | SUBCUTANEOUS | Status: DC
  Administered 2012-11-06: 12 [IU] via SUBCUTANEOUS
  Administered 2012-11-06 (×2): 8 [IU] via SUBCUTANEOUS
  Administered 2012-11-07: 12 [IU] via SUBCUTANEOUS
  Administered 2012-11-07: 4 [IU] via SUBCUTANEOUS
  Administered 2012-11-07: 8 [IU] via SUBCUTANEOUS
  Administered 2012-11-07: 12 [IU] via SUBCUTANEOUS
  Administered 2012-11-07 (×2): 4 [IU] via SUBCUTANEOUS
  Administered 2012-11-07: 12 [IU] via SUBCUTANEOUS
  Administered 2012-11-08 (×2): 4 [IU] via SUBCUTANEOUS
  Administered 2012-11-08: 8 [IU] via SUBCUTANEOUS
  Administered 2012-11-08: 4 [IU] via SUBCUTANEOUS
  Administered 2012-11-08: 12 [IU] via SUBCUTANEOUS
  Administered 2012-11-09 (×2): 4 [IU] via SUBCUTANEOUS
  Administered 2012-11-09: 8 [IU] via SUBCUTANEOUS
  Administered 2012-11-09: 4 [IU] via SUBCUTANEOUS
  Administered 2012-11-09: 2 [IU] via SUBCUTANEOUS

## 2012-11-06 MED ORDER — ACETAMINOPHEN 500 MG PO TABS: 1000.0000 mg | ORAL_TABLET | Freq: Four times a day (QID) | ORAL | Status: AC

## 2012-11-06 MED ORDER — MUPIROCIN 2 % EX OINT
TOPICAL_OINTMENT | Freq: Two times a day (BID) | CUTANEOUS | Status: DC
  Administered 2012-11-06: 1 via NASAL
  Administered 2012-11-07 – 2012-11-10 (×6): via NASAL
  Filled 2012-11-06 (×2): qty 22

## 2012-11-06 MED ORDER — FUROSEMIDE 20 MG PO TABS
10.0000 mg | ORAL_TABLET | Freq: Every day | ORAL | Status: DC
  Administered 2012-11-07 – 2012-11-10 (×4): 10 mg via ORAL
  Filled 2012-11-06 (×6): qty 0.5

## 2012-11-06 MED ORDER — VECURONIUM BROMIDE 10 MG IV SOLR
INTRAVENOUS | Status: DC | PRN
  Administered 2012-11-06: 2 mg via INTRAVENOUS

## 2012-11-06 MED ORDER — KCL IN DEXTROSE-NACL 20-5-0.45 MEQ/L-%-% IV SOLN
INTRAVENOUS | Status: DC
  Administered 2012-11-06 – 2012-11-07 (×2): via INTRAVENOUS
  Administered 2012-11-07: 75 mL/h via INTRAVENOUS
  Administered 2012-11-07: 09:00:00 via INTRAVENOUS
  Filled 2012-11-06 (×6): qty 1000

## 2012-11-06 MED ORDER — SUCCINYLCHOLINE CHLORIDE 20 MG/ML IJ SOLN
INTRAMUSCULAR | Status: DC | PRN
  Administered 2012-11-06: 120 mg via INTRAVENOUS

## 2012-11-06 MED ORDER — OXYCODONE HCL 5 MG PO TABS: 5.0000 mg | ORAL_TABLET | Freq: Once | ORAL | Status: DC | PRN

## 2012-11-06 MED ORDER — TRAMADOL HCL 50 MG PO TABS
50.0000 mg | ORAL_TABLET | Freq: Four times a day (QID) | ORAL | Status: DC | PRN
  Administered 2012-11-08: 100 mg via ORAL
  Filled 2012-11-06: qty 2

## 2012-11-06 MED ORDER — LACTATED RINGERS IV SOLN
INTRAVENOUS | Status: DC | PRN
  Administered 2012-11-06 (×2): via INTRAVENOUS

## 2012-11-06 MED ORDER — MUPIROCIN 2 % EX OINT
TOPICAL_OINTMENT | CUTANEOUS | Status: AC
  Filled 2012-11-06: qty 22

## 2012-11-06 MED ORDER — OXYCODONE-ACETAMINOPHEN 5-325 MG PO TABS
1.0000 | ORAL_TABLET | ORAL | Status: DC | PRN
  Administered 2012-11-07 (×2): 2 via ORAL
  Administered 2012-11-08: 1 via ORAL
  Administered 2012-11-08 – 2012-11-10 (×5): 2 via ORAL
  Filled 2012-11-06 (×9): qty 2

## 2012-11-06 MED ORDER — HYDROCODONE-HOMATROPINE 5-1.5 MG/5ML PO SYRP: 2.5000 mL | ORAL_SOLUTION | Freq: Four times a day (QID) | ORAL | Status: DC | PRN

## 2012-11-06 SURGICAL SUPPLY — 42 items
ADH SKN CLS APL DERMABOND .7 (GAUZE/BANDAGES/DRESSINGS) ×1
CANISTER SUCTION 2500CC (MISCELLANEOUS) ×2 IMPLANT
CATH THORACIC 28FR (CATHETERS) IMPLANT
CONT SPEC 4OZ CLIKSEAL STRL BL (MISCELLANEOUS) ×4 IMPLANT
COVER SURGICAL LIGHT HANDLE (MISCELLANEOUS) ×2 IMPLANT
DERMABOND ADVANCED (GAUZE/BANDAGES/DRESSINGS) ×1
DERMABOND ADVANCED .7 DNX12 (GAUZE/BANDAGES/DRESSINGS) IMPLANT
DRAIN CHANNEL 32F RND 10.7 FF (WOUND CARE) ×1 IMPLANT
DRAPE CHEST BREAST 15X10 FENES (DRAPES) ×2 IMPLANT
ELECT REM PT RETURN 9FT ADLT (ELECTROSURGICAL) ×2
ELECTRODE REM PT RTRN 9FT ADLT (ELECTROSURGICAL) ×1 IMPLANT
GAUZE SPONGE 4X4 16PLY XRAY LF (GAUZE/BANDAGES/DRESSINGS) ×1 IMPLANT
GLOVE BIOGEL M STER SZ 6 (GLOVE) ×1 IMPLANT
GLOVE BIOGEL PI IND STRL 6.5 (GLOVE) IMPLANT
GLOVE BIOGEL PI IND STRL 7.0 (GLOVE) IMPLANT
GLOVE BIOGEL PI INDICATOR 6.5 (GLOVE) ×1
GLOVE BIOGEL PI INDICATOR 7.0 (GLOVE) ×1
GLOVE EUDERMIC 7 POWDERFREE (GLOVE) ×2 IMPLANT
GOWN PREVENTION PLUS XLARGE (GOWN DISPOSABLE) ×2 IMPLANT
GOWN STRL NON-REIN LRG LVL3 (GOWN DISPOSABLE) ×2 IMPLANT
HEMOSTAT SURGICEL 2X14 (HEMOSTASIS) IMPLANT
KIT BASIN OR (CUSTOM PROCEDURE TRAY) ×2 IMPLANT
KIT ROOM TURNOVER OR (KITS) ×2 IMPLANT
NS IRRIG 1000ML POUR BTL (IV SOLUTION) ×2 IMPLANT
PACK GENERAL/GYN (CUSTOM PROCEDURE TRAY) ×2 IMPLANT
PAD ARMBOARD 7.5X6 YLW CONV (MISCELLANEOUS) ×4 IMPLANT
SPONGE GAUZE 4X4 12PLY (GAUZE/BANDAGES/DRESSINGS) ×2 IMPLANT
STAPLER VISISTAT 35W (STAPLE) IMPLANT
SUT SILK  1 MH (SUTURE) ×1
SUT SILK 1 MH (SUTURE) IMPLANT
SUT SILK 2 0 TIES 17X18 (SUTURE)
SUT SILK 2-0 18XBRD TIE BLK (SUTURE) IMPLANT
SUT VIC AB 2-0 CT1 27 (SUTURE) ×4
SUT VIC AB 2-0 CT1 TAPERPNT 27 (SUTURE) ×1 IMPLANT
SUT VIC AB 3-0 SH 27 (SUTURE) ×2
SUT VIC AB 3-0 SH 27XBRD (SUTURE) ×1 IMPLANT
SYRINGE 10CC LL (SYRINGE) ×2 IMPLANT
SYSTEM SAHARA CHEST DRAIN RE-I (WOUND CARE) IMPLANT
TAPE CLOTH SURG 4X10 WHT LF (GAUZE/BANDAGES/DRESSINGS) ×1 IMPLANT
TOWEL OR 17X24 6PK STRL BLUE (TOWEL DISPOSABLE) ×2 IMPLANT
TOWEL OR 17X26 10 PK STRL BLUE (TOWEL DISPOSABLE) ×2 IMPLANT
WATER STERILE IRR 1000ML POUR (IV SOLUTION) ×2 IMPLANT

## 2012-11-06 NOTE — Transfer of Care (Signed)
Immediate Anesthesia Transfer of Care Note  Patient: Kaitlin [name]  Procedure(s) Performed: Procedure(s): MEDIASTINOTOMY CHAMBERLAIN MCNEILPROCEDURE (Right)  Patient Location: PACU  Anesthesia Type:General  Level of Consciousness: awake, alert  and oriented  Airway & Oxygen Therapy: Patient Spontanous Breathing and Patient connected to face mask oxygen  Post-op Assessment: Report given to PACU RN, Post -op Vital signs reviewed and stable and Patient moving all extremities X 4  Post vital signs: Reviewed and stable  Complications: No apparent anesthesia complications

## 2012-11-06 NOTE — Progress Notes (Addendum)
Physician notified: [place] office RN, Ryann At: 1504  Regarding: ABG results: pH7.28, pCO2 58, pO2 75.9, HCO3 27 on 3 liters nasal cannula.  Awaiting return response.    Returned Response at: 1505  Order(s): Initiate BiPAP, probably lethargy due to pCO2.    Contacted RT to set up BiPAP, Kim RT in person.

## 2012-11-06 NOTE — Brief Op Note (Signed)
11/06/2012  11:02 AM  PATIENT:  Kaitlin [name]  47 y.o. female  PRE-OPERATIVE DIAGNOSIS:  mediastinal mass  POST-OPERATIVE DIAGNOSIS:  mediastinal mass  PROCEDURE:  Procedure(s): MEDIASTINOTOMY CHAMBERLAIN MCNEILPROCEDURE (Right) Biopsy of anterior mediastinal tumor  SURGEON:  Surgeon(s) and Role:    * Alleen Borne, MD - Primary  PHYSICIAN ASSISTANT: Doree Fudge, PA-C  ASSISTANTS: none   ANESTHESIA:   general  EBL:     BLOOD ADMINISTERED:none  DRAINS: (1 32 F) Blake drain(s) in the right pleural space   LOCAL MEDICATIONS USED:  NONE  SPECIMEN:  Source of Specimen:  anterior mediastinal tumore  DISPOSITION OF SPECIMEN:  PATHOLOGY  Frozen Section: Abundant lymphoid tissue suggesting lymphoma  COUNTS:  YES  PLAN OF CARE: Admit to inpatient   PATIENT DISPOSITION:  PACU - hemodynamically stable.   Delay start of Pharmacological VTE agent (>24hrs) due to surgical blood loss or risk of bleeding: yes

## 2012-11-06 NOTE — Anesthesia Preprocedure Evaluation (Addendum)
Anesthesia Evaluation  Patient identified by MRN, date of birth, ID band Patient awake    Reviewed: Allergy & Precautions, H&P , NPO status , Patient's Chart, lab work & pertinent test results  Airway Mallampati: III TM Distance: >3 FB Neck ROM: Full    Dental no notable dental hx. (+) Teeth Intact and Dental Advisory Given   Pulmonary neg pulmonary ROS,  breath sounds clear to auscultation  Pulmonary exam normal       Cardiovascular hypertension, negative cardio ROS  Rhythm:Regular Rate:Normal     Neuro/Psych negative neurological ROS  negative psych ROS   GI/Hepatic negative GI ROS, Neg liver ROS,   Endo/Other  diabetes, Type 1, Insulin DependentMorbid obesity  Renal/GU negative Renal ROS  negative genitourinary   Musculoskeletal   Abdominal   Peds  Hematology negative hematology ROS (+) Sickle cell trait ,   Anesthesia Other Findings   Reproductive/Obstetrics negative OB ROS                          Anesthesia Physical Anesthesia Plan  ASA: III  Anesthesia Plan: General   Post-op Pain Management:    Induction: Intravenous  Airway Management Planned: Oral ETT and Video Laryngoscope Planned  Additional Equipment:   Intra-op Plan:   Post-operative Plan: Extubation in OR  Informed Consent: I have reviewed the patients History and Physical, chart, labs and discussed the procedure including the risks, benefits and alternatives for the proposed anesthesia with the patient or authorized representative who has indicated his/her understanding and acceptance.   Dental advisory given  Plan Discussed with: CRNA and Surgeon  Anesthesia Plan Comments:         Anesthesia Quick Evaluation

## 2012-11-06 NOTE — Preoperative (Signed)
Beta Blockers   Reason not to administer Beta Blockers:Not Applicable 

## 2012-11-06 NOTE — Plan of Care (Signed)
Problem: Phase I Progression Outcomes Goal: Voiding-avoid urinary catheter unless indicated Outcome: Not Met (add Reason) Pt. Is due to void. Will bladder scan if no voiding by late afternoon.

## 2012-11-06 NOTE — Progress Notes (Signed)
Utilization review completed.  

## 2012-11-06 NOTE — Anesthesia Procedure Notes (Signed)
Procedure Name: Intubation Date/Time: 11/06/2012 9:59 AM Performed by: Marena Chancy Pre-anesthesia Checklist: Patient identified, Patient being monitored, Emergency Drugs available, Suction available and Timeout performed Patient Re-evaluated:Patient Re-evaluated prior to inductionOxygen Delivery Method: Circle system utilized Preoxygenation: Pre-oxygenation with 100% oxygen Intubation Type: IV induction Ventilation: Oral airway inserted - appropriate to patient size and Mask ventilation without difficulty Tube type: Oral Tube size: 8.0 mm Number of attempts: 1 Airway Equipment and Method: Video-laryngoscopy Placement Confirmation: breath sounds checked- equal and bilateral,  positive ETCO2 and CO2 detector Secured at: 24 cm Tube secured with: Tape Dental Injury: Teeth and Oropharynx as per pre-operative assessment  Difficulty Due To: Difficulty was anticipated and Difficult Airway- due to large tongue Future Recommendations: Recommend- induction with short-acting agent, and alternative techniques readily available Comments: Significant tonsillar hypertrophy

## 2012-11-06 NOTE — H&P (Signed)
Cardiothoracic Surgery History and Physical   PCP is Teodoro Spray, MD  Referring Provider is Teodoro Spray, MD  Chief Complaint   Patient presents with   .  Lung Mass     Surgical eval on Lung Mass, CTA Chest 10/17/12, PET SCAN scheduled for 11/03/12   HPI:  The patient is a 47 year old woman who was evaluated by Dr. Clelia Croft in July of 2013 for anemia and leukocytosis. She said that her primary care physician had noted an elevated white blood cell count for a while before that. Workup revealed normal flow cytometry and a high RDW and microcytosis. She says that she was in her usual state of health until about 2 weeks ago when she began developing chest pain, shortness of breath, and congestion. She reports a low-grade fever of 99-100 and some night sweats which she thought were due to menopause. She was seen in the emergency room and a CT scan the chest ruled out pulmonary emboli but did show a large anterior mediastinal mass measuring approximately 6 x 10 cm.  Past Medical History   Diagnosis  Date   .  Diabetes mellitus    .  High cholesterol    .  Leukocytosis    .  Anemia    .  Obesity    .  Hypertension    .  Sickle cell trait     Past Surgical History   Procedure  Laterality  Date   .  Tonsillectomy      Family History   Problem  Relation  Age of Onset   .  Heart disease  Father    .  Diabetes type II       Family HX   Social History  History   Substance Use Topics   .  Smoking status:  Never Smoker   .  Smokeless tobacco:  Never Used   .  Alcohol Use:  No    Current Outpatient Prescriptions   Medication  Sig  Dispense  Refill   .  albuterol (PROVENTIL HFA;VENTOLIN HFA) 108 (90 BASE) MCG/ACT inhaler  Inhale 2 puffs into the lungs every 4 (four) hours as needed for wheezing.  1 Inhaler  0   .  azithromycin (ZITHROMAX Z-PAK) 250 MG tablet  2 po day one, then 1 daily x 4 days  5 tablet  0   .  ferrous fumarate (HEMOCYTE - 106 MG FE) 325 (106 FE) MG TABS tablet  Take 1  tablet by mouth daily.     .  furosemide (LASIX) 20 MG tablet  Take 10 mg by mouth daily.     Marland Kitchen  HYDROcodone-homatropine (HYCODAN) 5-1.5 MG/5ML syrup  Take 2.5 mLs by mouth every 6 (six) hours as needed for cough.  30 mL  0   .  ibuprofen (ADVIL,MOTRIN) 200 MG tablet  Take 600 mg by mouth every 8 (eight) hours as needed. For pain.     Marland Kitchen  insulin glargine (LANTUS) 100 UNIT/ML injection  Inject 30 Units into the skin at bedtime.     .  insulin regular (NOVOLIN R,HUMULIN R) 100 units/mL injection  Inject into the skin 3 (three) times daily before meals.     Marland Kitchen  lisinopril (PRINIVIL,ZESTRIL) 10 MG tablet  Take 10 mg by mouth daily.     .  metFORMIN (GLUCOPHAGE) 500 MG tablet  Take 500 mg by mouth 2 (two) times daily with a meal.     .  Multiple Vitamin (MULTIVITAMIN WITH MINERALS)  TABS  Take 1 tablet by mouth daily.     .  Multiple Vitamins-Minerals (IMMUNE SUPPORT VITAMIN C) PACK  Take 1 Package by mouth daily.     .  simvastatin (ZOCOR) 40 MG tablet  Take 40 mg by mouth at bedtime.      No current facility-administered medications for this visit.    Allergies   Allergen  Reactions   .  Amoxicillin-Pot Clavulanate  Diarrhea   Review of Systems  Constitutional: Positive for fever, fatigue and unexpected weight change. Negative for diaphoresis.  Recent low grade fever 99 degrees.  Weight gain but says she isn't eating much.  Eyes: Negative.  Respiratory: Positive for cough, chest tightness and shortness of breath.  Cardiovascular: Positive for chest pain and leg swelling. Negative for palpitations.  Gastrointestinal: Negative.  Endocrine: Negative.  Genitourinary: Negative.  Musculoskeletal: Negative.  Skin: Negative.  Allergic/Immunologic: Negative.  Neurological: Positive for headaches.  Hematological: Negative.  Psychiatric/Behavioral: Negative.  BP 126/68  Pulse 106  Resp 20  Ht 5\' 9"  (1.753 m)  Wt 344 lb (156.037 kg)  BMI 50.78 kg/m2  SpO2 96%  LMP 06/17/2012  Physical Exam   Constitutional: She is oriented to person, place, and time.  Morbidly obese woman in no distress  HENT:  Head: Normocephalic and atraumatic.  Mouth/Throat: Oropharynx is clear and moist.  Eyes: Conjunctivae and EOM are normal. Pupils are equal, round, and reactive to light.  Neck: Normal range of motion. Neck supple. No JVD present. No tracheal deviation present. No thyromegaly present.  No supraclavicular or axillary adenopathy  Cardiovascular: Normal rate, regular rhythm, normal heart sounds and intact distal pulses. Exam reveals no gallop and no friction rub.  No murmur heard.  Pulmonary/Chest: Effort normal. No respiratory distress. She has no wheezes. She has no rales. She exhibits no tenderness.  Abdominal: Soft. Bowel sounds are normal. She exhibits no mass. There is no tenderness.  Musculoskeletal: Normal range of motion. She exhibits edema. She exhibits no tenderness.  In legs  Lymphadenopathy:  She has no cervical adenopathy.  Neurological: She is alert and oriented to person, place, and time. She has normal strength. No cranial nerve deficit or sensory deficit.  Skin: Skin is warm and dry.  Psychiatric: She has a normal mood and affect.  Diagnostic Tests:  *RADIOLOGY REPORT*  Clinical Data: Abnormal chest radiograph, shortness of breath,  congestion, chest tightness, evaluate for pulmonary embolism,  initial encounter.  CT ANGIOGRAPHY CHEST  Technique: Multidetector CT imaging of the chest using the  standard protocol during bolus administration of intravenous  contrast. Multiplanar reconstructed images including MIPs were  obtained and reviewed to evaluate the vascular anatomy.  Contrast: OMNIPAQUE IOHEXOL 350 MG/ML SOLN  Comparison: Chest radiograph - earlier same day  Vascular Findings:  There is suboptimal opacification of the pulmonary arterial system  with the main pulmonary artery measuring only 191 HU. There are no  discrete filling defects within the  central pulmonary arterial tree  to suggest central pulmonary embolism. Evaluation of the distal  segmental and distal subsegmental pulmonary arteries is degraded  due to a combination of suboptimal vessel opacification,  respiratory artifact and patient body habitus. Normal-caliber of  the main pulmonary artery.  Borderline cardiomegaly. Calcifications of the mitral valve  annulus. No pericardial effusion.  Normal caliber of the thoracic aorta. Conventional configuration  of the aortic arch. No definite periaortic stranding.  -------------------------------------------------------------  Nonvascular findings:  Evaluation of the pulmonary parenchyma is degraded due to patient  respiratory artifact.  There is an approximately 9.6 x 7.8 x 8.0 cm (as measured in  greatest oblique axial and cranial caudal dimensions respectively -  axial image 25, series 6; coronal image 49, series 9) anterior  mediastinal mass which is noted to contain an internal dystrophic  calcification (images 24 - 26, series six). This mass appears  separate from a mild diffusely enlarged but otherwise normal  appearing thyroid gland. This mass is without definitive  associated mediastinal or hilar lymphadenopathy with index  pretracheal lymph node measuring 8 mm in diameter (image 70, series  12). There are shoddy bilateral axillary lymph nodes with index  right axillary node measuring 8 mm in short axis diameter (image  31, series 6) and index left axillary lymph node measuring  approximately 1.1 cm in diameter (image 40, series 12).  There is minimal dependent ground-glass atelectasis. No focal  airspace opacity. No discrete pulmonary nodules. No pleural  effusion or pneumothorax. The central pulmonary airways are widely  patent.  Early arterial phase evaluation of the upper abdomen demonstrates  borderline enlargement of the spleen measuring approximately 12.6  cm in length and approximately 6.9 cm in diameter.   No acute or aggressive osseous abnormalities. There is mild  multilevel DDD throughout the thoracic spine, worse at T5 - T6, T7  - T8 and T8 - T9 with disc space height loss, end plate  irregularity and small posteriorly directed disc osteophyte  complexes at these levels.  IMPRESSION:  1. No central pulmonary embolism. Evaluation the segmental and  distal subsegmental pulmonary arteries is degraded due to a  combination of suboptimal vessel opacification, patient body  habitus and patient respiratory artifact. Further evaluation with  bilateral venous Doppler ultrasound may be performed as clinically  indicated.  2. Large, approximately 9.6 cm anterior mediastinal mass  correlates with the findings on recently performed chest  radiograph. While this mass is indeterminate, differential  considerations include lymphoma, teratoma or a lesion of thymic  etiology. Further evaluation with a PET scan may be performed as  clinically indicated.  3. Borderline splenomegaly and shoddy nonspecific axillary lymph  nodes with a solitary left axillary lymph node which approaches  enlargement by size criteria. Given the above described anterior  mediastinal mass, lymphomatous involvement is not excluded.  4. Multilevel thoracic spine degenerative change.  Original Report Authenticated By: Tacey Ruiz, MD  Impression:  She has a 6 x 10 cm anterior mediastinal mass that could be a lymphoma, teratoma, or thymic tumor. She is scheduled for a PET scan on Friday. Since there is a high likelihood that this is a lymphoma I think an open biopsy would be best. This could be approached through a right anterior mediastinotomy. A CT guided needle biopsy would most likely not give enough tissue to make a firm diagnosis and allow further workup if this is a lymphoma. I will review her PET scan to be sure there is not some other area of hypermetabolic uptake that would be easier to biopsy. I discussed the operative  procedure with the patient and family including alternatives, benefits and risks; including but not limited to bleeding, blood transfusion, and infection. Marlana Salvage understands and agrees to proceed.   Plan:   I will followup on her PET scan to be done on Friday. I will plan surgical biopsy by a right anterior mediastinotomy on Monday, 11/06/2012.

## 2012-11-06 NOTE — Anesthesia Postprocedure Evaluation (Signed)
  Anesthesia Post-op Note  Patient: Kaitlin [name]  Procedure(s) Performed: Procedure(s): MEDIASTINOTOMY CHAMBERLAIN MCNEILPROCEDURE (Right)  Patient Location: PACU  Anesthesia Type:General  Level of Consciousness: awake  Airway and Oxygen Therapy: Patient Spontanous Breathing and Patient connected to nasal cannula oxygen  Post-op Pain: mild  Post-op Assessment: Post-op Vital signs reviewed, Patient's Cardiovascular Status Stable and Respiratory Function Stable  Post-op Vital Signs: Reviewed and stable  Complications: No apparent anesthesia complications

## 2012-11-06 NOTE — Progress Notes (Addendum)
Bladder scan results >431ml. Pt. Unable to void. Foley placed with sterile procedure. Assisted by Julien Girt, RN and Peggie, NT. Pt. Tolerated well. Continued on BiPAP. Starting to awaken more. Requesting more insulin than ordered, CBG 293, administered 12 units per order. Will contact Dr. Laneta Simmers per patient request. Will continue to monitor. Family at bedside.

## 2012-11-06 NOTE — Op Note (Signed)
CARDIOTHORACIC SURGERY OPERATIVE NOTE:  Kaitlin [name] [id number] 11/06/2012   Preoperative Dx:  Anterior mediastinal mass  Postoperative Dx: same   Procedure: Anterior mediastinotomy Lula Olszewski procedure) for biopsy of mediastinal mass  Surgeon: Dr. Alleen Borne   Assistant: Doree Fudge, PA-C  Anesthesia: GET   Clinical History:   The patient is a 47 year old woman who was evaluated by Dr. Clelia Croft in July of 2013 for anemia and leukocytosis. She said that her primary care physician had noted an elevated white blood cell count for a while before that. Workup revealed normal flow cytometry and a high RDW and microcytosis. She says that she was in her usual state of health until about 2 weeks ago when she began developing chest pain, shortness of breath, and congestion. She reports a low-grade fever of 99-100 and some night sweats which she thought were due to menopause. She was seen in the emergency room and a CT scan the chest ruled out pulmonary emboli but did show a large anterior mediastinal mass measuring approximately 6 x 10 cm that could be a lymphoma, teratoma, or thymic tumor. She is scheduled for a PET scan on Friday. Since there is a high likelihood that this is a lymphoma I think an open biopsy would be best. This could be approached through a right anterior mediastinotomy. A CT guided needle biopsy would most likely not give enough tissue to make a firm diagnosis and allow further workup if this is a lymphoma. I will review her PET scan to be sure there is not some other area of hypermetabolic uptake that would be easier to biopsy. I discussed the operative procedure with the patient and family including alternatives, benefits and risks; including but not limited to bleeding, blood transfusion, and infection. Kaitlin [name] understands and agrees to proceed.       Operative procedure:   The patient was seen in the preoperative holding area. The proper patient,  proper operative side, proper operation were confirmed after reviewing his history and chest x-ray. The right side of the chest was signed by me. Preoperative intravenous antibiotics were given. He was taken back to the operating room and placed on table in the supine position. After induction of general endotracheal anesthesia using a single-lumen tube, lower extremity sequential compression devices were placed. The patient was positioned in the supine. The chest was prepped with Betadine soap and solution and draped in the usual sterile manner. A timeout was taken and the proper patient, proper operation, and proper operative side were confirmed with nursing and anesthesia staff.  A 4 cm transverse incision was made in the right anterior chest over the 3rd costal cartilage. The subcutaneous tissue was divided with electrocautery and the pectoralis major muscle was split along it fibers to expose the 3rd costal cartilage. The intercostal muscle was divided superior to the 3rd cartilage to enter the mediastinum. The pleural space was entered. The mass was easily seen and was just under the interspace. It was a fleshy pink-tan tumor. A wedge biopsy was taken using a scalpel and sent for frozen section. Dr. 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 Additional specimen was sent for permanent examination. There was complete hemostasis. A 32 F Blake drain was brought through a separate incision and positioned in the right pleural space. The pectoralis muscle was approximated with continuous 0-vicryl suture. The subcutaneous tissue was approximated with continuous 2-0 vicryl suture and the skin was closed with  3-0 vicryl subcuticular suture. Dermabond was applied.The chest tube was connected to Pleur-evac suction. The sponge needle and instrument counts were correct according to the scrub nurse. The patient was then awakened, extubated, and transported to the post  anesthesia care unit in satisfactory and stable condition.

## 2012-11-06 NOTE — Progress Notes (Signed)
Pt admitted from PACU - oreinted to unit and routines, pain management and safety discussed - pt verbalizes understanding

## 2012-11-07 ENCOUNTER — Encounter (HOSPITAL_COMMUNITY): Payer: Self-pay | Admitting: Surgery

## 2012-11-07 ENCOUNTER — Inpatient Hospital Stay (HOSPITAL_COMMUNITY): Payer: BC Managed Care – PPO

## 2012-11-07 ENCOUNTER — Telehealth: Payer: Self-pay | Admitting: *Deleted

## 2012-11-07 LAB — GLUCOSE, CAPILLARY
Glucose-Capillary: 252 mg/dL — ABNORMAL HIGH (ref 70–99)
Glucose-Capillary: 204 mg/dL — ABNORMAL HIGH (ref 70–99)
Glucose-Capillary: 161 mg/dL — ABNORMAL HIGH (ref 70–99)

## 2012-11-07 MED ORDER — ENOXAPARIN SODIUM 40 MG/0.4ML ~~LOC~~ SOLN
40.0000 mg | SUBCUTANEOUS | Status: DC
  Administered 2012-11-07 – 2012-11-09 (×3): 40 mg via SUBCUTANEOUS
  Filled 2012-11-07 (×4): qty 0.4

## 2012-11-07 NOTE — Progress Notes (Signed)
Pt. Refused dangle/OOB this AM. Removed BiPAP, placed on 2 Liters per nasal cannula. Has strong, dry, non productive cough. VSS. Pt. States concerns with blood glucose, has not been below 200 since on SDU, receiving SSI. Will continue to monitor.

## 2012-11-07 NOTE — Telephone Encounter (Signed)
No note

## 2012-11-07 NOTE — Progress Notes (Addendum)
      [address]       [name] [address]             [phone]       1 Day Post-Op Procedure(s) (LRB): MEDIASTINOTOMY CHAMBERLAIN MCNEILPROCEDURE (Right)  Subjective: Patient on bipap this am.  Objective: Vital signs in last 24 hours: Temp:  [97.2 F (36.2 C)-99.2 F (37.3 C)] 97.7 F (36.5 C) (10/28 0728) Pulse Rate:  [97-124] 109 (10/28 0728) Cardiac Rhythm:  [-] Sinus tachycardia (10/28 0300) Resp:  [7-35] 31 (10/28 0728) BP: (102-168)/(47-86) 102/47 mmHg (10/28 0728) SpO2:  [91 %-98 %] 96 % (10/28 0728) FiO2 (%):  [50 %] 50 % (10/28 0300) Weight:  [154.7 kg (341 lb 0.8 oz)] 154.7 kg (341 lb 0.8 oz) (10/27 0818)     Intake/Output from previous day: 10/27 0701 - 10/28 0700 In: 2800 [I.V.:2800] Out: 1650 [Urine:1550; Chest Tube:100]   Physical Exam:  Cardiovascular: RRR Pulmonary: Clear to auscultation bilaterally; no rales, wheezes, or rhonchi. Abdomen: Soft, non tender, bowel sounds present. Extremities: SCDs in place Wounds: Dressing is clean and dry.   Chest Tube: to suction and no air leak  Lab Results: CBC: Recent Labs  11/06/12 0857  WBC 17.1*  HGB 12.7  HCT 37.5  PLT 301   BMET:  Recent Labs  11/06/12 0857  NA 141  K 4.1  CL 105  CO2 26  GLUCOSE 171*  BUN 16  CREATININE 0.78  CALCIUM 9.2    PT/INR:  Recent Labs  11/06/12 0857  LABPROT 13.0  INR 1.00   ABG:  INR: Will add last result for INR, ABG once components are confirmed Will add last 4 CBG results once components are confirmed  Assessment/Plan:  1. CV - SR 2.  Pulmonary - Bard tube with 100 cc of output since surgery. No air leak. CXR this am shows patient is rotated to the left,no pneumothorax, low lung volumes, and stable mediastinal mass. Likely place bard to water seal. Check CXR in am 3.Urinary retention-required foley insertion. Voiding trial later today vs am  ZIMMERMAN,DONIELLE MPA-C 11/07/2012,8:02 AM   Chart reviewed, patient  examined, agree with above. Remove chest tube in am and ambulate. Plan home Thursday. Path still pending.

## 2012-11-07 NOTE — Progress Notes (Signed)
Bipap is not needed at this time. Pt has been on nasal cannula most of the day with no complications. Diminshed BBS heard. No distress noted. RT will continue to monitor.

## 2012-11-07 NOTE — Progress Notes (Signed)
Pt. Request to be placed back on BiPAP from [place]. Sat 94% on 2 liters oxygen. Educated patient on goal to stay off BiPAP, will not go home on BiPAP. Pt. States drowsiness; RN administered pain medication at 1114. Explained to patient and family the pain medication can cause drowsiness. Pt. Needs to stay on nasal cannula. Will continue to monitor patient.

## 2012-11-07 NOTE — Discharge Summary (Addendum)
Physician Discharge Summary       [address]       [name] [address]             [phone]    Patient ID: Kaitlin [name] MRN: [id number] DOB/AGE: [birth year] 45 y.o.  Admit date: 11/06/2012 Discharge date: 11/09/2012  Admission Diagnoses: 1. Large anterior mediastinal mass 2.History of DM 3.History of hypercholesterolemia 4.History of obesity 5.History of hypertension 6.History of sickle cell trait  Discharge Diagnoses:  1. Large anterior mediastinal mass 2.History of DM 3.History of hypercholesterolemia 4.History of obesity 5.History of hypertension 6.History of sickle cell trait   Procedure (s):  Anterior mediastinotomy Kaitlin [name] procedure) for biopsy of mediastinal mass By Dr. Laneta Simmers on 11/06/2012.   Pathology: Results are pending  History of Presenting Illness: The patient is a 47 year old woman who was evaluated by Dr. Clelia Croft in July of 2013 for anemia and leukocytosis. She said that her primary care physician had noted an elevated white blood cell count for a while before that. Workup revealed normal flow cytometry and a high RDW and microcytosis. She says that she was in her usual state of health until about 2 weeks ago when she began developing chest pain, shortness of breath, and congestion. She reports a low-grade fever of 99-100 and some night sweats which she thought were due to menopause. She was seen in the emergency room and a CT scan the chest ruled out pulmonary emboli but did show a large anterior mediastinal mass measuring approximately 6 x 10 cm. Dr. Laneta Simmers discussed the need for a [facility] procedure (anterior mediastinotomy) in order to obtain biopsies of the mass. Potential risks, complications, and benefits of the procedure were discussed with the patient and she agreed to proceed with surgery.   Brief Hospital Course:  She remained afebrile and hemodynamically stable. Her bard tube's output gradually decreased. Her chest  x ray remained stable. . She had a fair amount of pain and was given a few doses of Toradol. This did help, along with removal of the chest tube.Her chest tube was removed 10/29. She is tolerating a diet. She is a diabetic and needs to follow up with her medical doctor upon discharge, as her diabetes does not seem well controlled. She is ambulating on room air. Pathology results are still pending.Provided she remains afebrile, hemodynamically stable, and pending morning round evaluation, she will be surgically stable for discharge on 11/10/2012.   Latest Vital Signs: Blood pressure 134/60, pulse 121, temperature 99.2 F (37.3 C), temperature source Oral, resp. rate 29, height 5\' 9"  (1.753 m), weight 154.7 kg (341 lb 0.8 oz), last menstrual period 06/17/2012, SpO2 91.00%.  Physical Exam: Cardiovascular: RRR  Pulmonary: Clear to auscultation bilaterally; no rales, wheezes, or rhonchi.  Abdomen: Soft, non tender, bowel sounds present.  Extremities: SCDs in place  Wounds: Dressing is clean and dry.  Chest Tube: to suction and no air leak   Discharge Condition:Stable  Recent laboratory studies:  Lab Results  Component Value Date   WBC 17.1* 11/06/2012   HGB 12.7 11/06/2012   HCT 37.5 11/06/2012   MCV 75.5* 11/06/2012   PLT 301 11/06/2012   Lab Results  Component Value Date   NA 141 11/06/2012   K 4.1 11/06/2012   CL 105 11/06/2012   CO2 26 11/06/2012   CREATININE 0.78 11/06/2012   GLUCOSE 171* 11/06/2012      Diagnostic Studies: Ct Angio Chest Pe W/cm &/or Wo Cm  10/17/2012   *RADIOLOGY  REPORT*  Clinical Data: Abnormal chest radiograph, shortness of breath, congestion, chest tightness, evaluate for pulmonary embolism, initial encounter.  CT ANGIOGRAPHY CHEST  Technique:  Multidetector CT imaging of the chest using the standard protocol during bolus administration of intravenous contrast. Multiplanar reconstructed images including MIPs were obtained and reviewed to evaluate the  vascular anatomy.  Contrast: OMNIPAQUE IOHEXOL 350 MG/ML SOLN  Comparison: Chest radiograph - earlier same day  Vascular Findings:  There is suboptimal opacification of the pulmonary arterial system with the main pulmonary artery measuring only 191 HU.  There are no discrete filling defects within the central pulmonary arterial tree to suggest central pulmonary embolism.  Evaluation of the distal segmental and distal subsegmental pulmonary arteries is degraded due to a combination of suboptimal vessel opacification, respiratory artifact and patient body habitus.  Normal-caliber of the main pulmonary artery.  Borderline cardiomegaly.  Calcifications of the mitral valve annulus.  No pericardial effusion.  Normal caliber of the thoracic aorta.  Conventional configuration of the aortic arch.  No definite periaortic stranding.  -------------------------------------------------------------  Nonvascular findings:  Evaluation of the pulmonary parenchyma is degraded due to patient respiratory artifact.  There is an approximately 9.6 x 7.8 x 8.0 cm (as measured in greatest oblique axial and cranial caudal dimensions respectively - axial image 25, series 6; coronal image 49, series 9) anterior mediastinal mass which is noted to contain an internal dystrophic calcification (images 24 - 26, series six). This mass appears separate from a mild diffusely enlarged but otherwise normal appearing thyroid gland.  This mass is without definitive associated mediastinal or hilar lymphadenopathy with index pretracheal lymph node measuring 8 mm in diameter (image 70, series 12).  There are shoddy bilateral axillary lymph nodes with index right axillary node measuring 8 mm in short axis diameter (image 31, series 6) and index left axillary lymph node measuring approximately 1.1 cm in diameter (image 40, series 12).  There is minimal dependent ground-glass atelectasis.  No focal airspace opacity. No discrete pulmonary nodules.  No  pleural effusion or pneumothorax.  The central pulmonary airways are widely patent.  Early arterial phase evaluation of the upper abdomen demonstrates borderline enlargement of the spleen measuring approximately 12.6 cm in length and approximately 6.9 cm in diameter.  No acute or aggressive osseous abnormalities.  There is mild multilevel DDD throughout the thoracic spine, worse at T5 - T6, T7 - T8 and T8 - T9 with disc space height loss, end plate irregularity and small posteriorly directed disc osteophyte complexes at these levels.  IMPRESSION:  1.  No central pulmonary embolism.  Evaluation the segmental and distal subsegmental pulmonary arteries is degraded due to a combination of suboptimal vessel opacification, patient body habitus and patient respiratory artifact. Further evaluation with bilateral venous Doppler ultrasound may be performed as clinically indicated.  2.  Large, approximately 9.6 cm anterior mediastinal mass correlates with the findings on recently performed chest radiograph.  While this mass is indeterminate, differential considerations include lymphoma, teratoma or a lesion of thymic etiology. Further evaluation with a PET scan may be performed as clinically indicated.  3.  Borderline splenomegaly and shoddy nonspecific axillary lymph nodes with a solitary left axillary lymph node which approaches enlargement by size criteria. Given the above described anterior mediastinal mass, lymphomatous involvement is not excluded.  4.  Multilevel thoracic spine degenerative change.   Original Report Authenticated By: Tacey Ruiz, MD   Dg Chest Port 1 View  11/07/2012   CLINICAL DATA:  Chest tube  EXAM: PORTABLE CHEST - 1 VIEW  COMPARISON:  11/06/2012  FINDINGS: Stable right chest tube. No pneumothorax. Very low lung volumes. Right paratracheal mediastinal mass stable.  IMPRESSION: Stable. No pneumothorax. Right chest tube remains in place.   Electronically Signed   By: Maryclare Bean M.D.   On:  11/07/2012 07:52           Future Appointments Provider Department Dept Phone   11/15/2012 7:00 AM [facility] [phone]   Pt should arrive15 minutes prior to scheduled appt time. Please inform patient that exam will take a minimum of 1 1/2 hours. Patient to be NPO 6 hours prior to exam  and should not take any insulin the day of exam.   11/29/2012 2:00 PM Alleen Borne, MD [facility] [phone]      Discharge Medications:   Medication List    ASK your doctor about these medications       albuterol 108 (90 BASE) MCG/ACT inhaler  Commonly known as:  PROVENTIL HFA;VENTOLIN HFA  Inhale 2 puffs into the lungs every 4 (four) hours as needed for wheezing.     azithromycin 250 MG tablet  Commonly known as:  ZITHROMAX Z-PAK  2 po day one, then 1 daily x 4 days     ferrous fumarate 325 (106 FE) MG Tabs tablet  Commonly known as:  HEMOCYTE - 106 mg FE  Take 1 tablet by mouth daily.     furosemide 20 MG tablet  Commonly known as:  LASIX  Take 10 mg by mouth daily.     HYDROcodone-homatropine 5-1.5 MG/5ML syrup  Commonly known as:  HYCODAN  Take 2.5 mLs by mouth every 6 (six) hours as needed for cough.     ibuprofen 200 MG tablet  Commonly known as:  ADVIL,MOTRIN  Take 600 mg by mouth every 8 (eight) hours as needed. For pain.     IMMUNE SUPPORT VITAMIN C Pack  Take 1 Package by mouth daily.     insulin glargine 100 UNIT/ML injection  Commonly known as:  LANTUS  Inject 30 Units into the skin at bedtime.     insulin regular 100 units/mL injection  Commonly known as:  NOVOLIN R,HUMULIN R  Inject into the skin 3 (three) times daily before meals.     lisinopril 10 MG tablet  Commonly known as:  PRINIVIL,ZESTRIL  Take 10 mg by mouth daily.     metFORMIN 500 MG tablet  Commonly known as:  GLUCOPHAGE  Take 500 mg by mouth 2 (two) times daily with a meal.     multivitamin with  minerals Tabs tablet  Take 1 tablet by mouth daily.     simvastatin 40 MG tablet  Commonly known as:  ZOCOR  Take 40 mg by mouth at bedtime.        Follow Up Appointments: Follow-up Information   Follow up with Alleen Borne, MD. (PA/LAT CXR to be taken (at [facility] which is in the same building as Dr. Sharee Pimple office) on 11/29/2012 at 1:00 pm;Appointment with Dr. Laneta Simmers is on 11/29/2012 at 2:00 pm)    Specialty:  Cardiothoracic Surgery   Contact information:   [address] [place] [address] [phone]       Signed: Doree Fudge MPA-C 11/09/2012, 8:01 AM  Discharge diagnosis:  Thymoma

## 2012-11-08 ENCOUNTER — Inpatient Hospital Stay (HOSPITAL_COMMUNITY): Payer: BC Managed Care – PPO

## 2012-11-08 LAB — GLUCOSE, CAPILLARY
Glucose-Capillary: 187 mg/dL — ABNORMAL HIGH (ref 70–99)
Glucose-Capillary: 235 mg/dL — ABNORMAL HIGH (ref 70–99)
Glucose-Capillary: 285 mg/dL — ABNORMAL HIGH (ref 70–99)

## 2012-11-08 MED ORDER — INSULIN GLARGINE 100 UNIT/ML ~~LOC~~ SOLN
30.0000 [IU] | Freq: Once | SUBCUTANEOUS | Status: AC
  Administered 2012-11-08: 30 [IU] via SUBCUTANEOUS
  Filled 2012-11-08: qty 0.3

## 2012-11-08 MED ORDER — OXYMETAZOLINE HCL 0.05 % NA SOLN
1.0000 | Freq: Two times a day (BID) | NASAL | Status: DC | PRN
  Filled 2012-11-08: qty 15

## 2012-11-08 MED ORDER — KETOROLAC TROMETHAMINE 30 MG/ML IJ SOLN: 30.0000 mg | Freq: Four times a day (QID) | INTRAMUSCULAR | Status: AC | PRN

## 2012-11-08 MED ORDER — INSULIN ASPART 100 UNIT/ML ~~LOC~~ SOLN
8.0000 [IU] | Freq: Three times a day (TID) | SUBCUTANEOUS | Status: DC
  Administered 2012-11-08 – 2012-11-10 (×6): 8 [IU] via SUBCUTANEOUS

## 2012-11-08 MED ORDER — METFORMIN HCL 500 MG PO TABS
500.0000 mg | ORAL_TABLET | Freq: Two times a day (BID) | ORAL | Status: DC
  Administered 2012-11-09 – 2012-11-10 (×3): 500 mg via ORAL
  Filled 2012-11-08 (×6): qty 1

## 2012-11-08 MED ORDER — LACTULOSE 10 GM/15ML PO SOLN
20.0000 g | Freq: Every day | ORAL | Status: DC | PRN
  Filled 2012-11-08: qty 30

## 2012-11-08 NOTE — Progress Notes (Signed)
PT is resting on [place] with no distress at this time, not requiring Bipap. Order made PRN due to patient has not needed since 10/28 am. RT will continue to follow.

## 2012-11-08 NOTE — Progress Notes (Addendum)
      [address]       [name] [address]             [phone]      2 Days Post-Op Procedure(s) (LRB): MEDIASTINOTOMY CHAMBERLAIN MCNEILPROCEDURE (Right)  Subjective:  Kaitlin [name] complains of pain this morning.  She states its about a 9/10 and she isn't getting much relief from pain medications.  The patient is not eating, but has been encouraged to attempt today.  Patient is requesting a nasal decongestant  No BM  Objective: Vital signs in last 24 hours: Temp:  [98.1 F (36.7 C)-99.8 F (37.7 C)] 99.8 F (37.7 C) (10/29 0821) Pulse Rate:  [96-105] 96 (10/29 0327) Cardiac Rhythm:  [-] Sinus tachycardia (10/28 2000) Resp:  [22-29] 22 (10/29 0327) BP: (92-116)/(47-65) 92/47 mmHg (10/29 0327) SpO2:  [90 %-95 %] 90 % (10/29 0327)  Intake/Output from previous day: 10/28 0701 - 10/29 0700 In: 1136.7 [P.O.:50; I.V.:1086.7] Out: 1900 [Urine:1700; Chest Tube:200] Intake/Output this shift: Total I/O In: -  Out: 350 [Urine:350]  General appearance: alert, cooperative and no distress Heart: regular rate and rhythm Lungs: clear to auscultation bilaterally Abdomen: soft, non-tender; bowel sounds normal; no masses,  no organomegaly Extremities: edema trace' Wound: clean and dry  Lab Results:  Recent Labs  11/06/12 0857  WBC 17.1*  HGB 12.7  HCT 37.5  PLT 301   BMET:  Recent Labs  11/06/12 0857  NA 141  K 4.1  CL 105  CO2 26  GLUCOSE 171*  BUN 16  CREATININE 0.78  CALCIUM 9.2    PT/INR:  Recent Labs  11/06/12 0857  LABPROT 13.0  INR 1.00   ABG    Component Value Date/Time   PHART 7.289* 11/06/2012 1445   HCO3 27.0* 11/06/2012 1445   TCO2 28.8 11/06/2012 1445   O2SAT 93.1 11/06/2012 1445   CBG (last 3)   Recent Labs  11/07/12 2347 11/08/12 0324 11/08/12 0817  GLUCAP 182* 187* 235*    Assessment/Plan: S/P Procedure(s) (LRB): MEDIASTINOTOMY CHAMBERLAIN MCNEILPROCEDURE (Right)  1. CV- NSR 2. Pulm- 200 cc output from  chest tube no air leak, will remove today, continued right sided poor aeration, encouarged use of IS, will order flutter valve 3. Pain control- patient on Percocet, Morphine, will give Toradol for 3 doses prn to help get pain under control, hopefully will improve once chest tube removed 4. GU- urinary retention, foley re-inserted on Sunday, will try voiding trial this AM, hopefully remove later today 5. DM- CBGs uncontrolled, patient refused Lantus for fear of hypoglycemia developing.  I explained Lantus is a long acting insulin and the patient should take as scheduled, she was agreeable to this 6. Dispo- patient with pain, constipation- will work on achieving pain control, d/c chest tube, ambulate   LOS: 2 days    Kaitlin [name] 11/08/2012   Chart reviewed, patient examined, agree with above. Feels better with chest tube out. Glucose remains high but she refused Levemir last pm and did not get any until this am when she was already over 200. Will add Novolog meal coverage and resume glucophage.

## 2012-11-09 ENCOUNTER — Inpatient Hospital Stay (HOSPITAL_COMMUNITY): Payer: BC Managed Care – PPO

## 2012-11-09 LAB — GLUCOSE, CAPILLARY
Glucose-Capillary: 167 mg/dL — ABNORMAL HIGH (ref 70–99)
Glucose-Capillary: 199 mg/dL — ABNORMAL HIGH (ref 70–99)
Glucose-Capillary: 186 mg/dL — ABNORMAL HIGH (ref 70–99)
Glucose-Capillary: 236 mg/dL — ABNORMAL HIGH (ref 70–99)
Glucose-Capillary: 280 mg/dL — ABNORMAL HIGH (ref 70–99)

## 2012-11-09 MED ORDER — LACTULOSE 10 GM/15ML PO SOLN
20.0000 g | Freq: Once | ORAL | Status: AC
  Administered 2012-11-09: 20 g via ORAL
  Filled 2012-11-09: qty 30

## 2012-11-09 MED ORDER — INSULIN ASPART 100 UNIT/ML ~~LOC~~ SOLN
0.0000 [IU] | SUBCUTANEOUS | Status: DC
  Administered 2012-11-09: 12 [IU] via SUBCUTANEOUS
  Administered 2012-11-10 (×2): 2 [IU] via SUBCUTANEOUS

## 2012-11-09 MED ORDER — GUAIFENESIN 100 MG/5ML PO SOLN
5.0000 mL | ORAL | Status: DC | PRN
  Filled 2012-11-09: qty 5

## 2012-11-09 NOTE — Progress Notes (Addendum)
      [address]       [name] [address]             [phone]       3 Days Post-Op Procedure(s) (LRB): MEDIASTINOTOMY CHAMBERLAIN MCNEILPROCEDURE (Right)  Subjective: Patient sitting in chair. Her pain is better controlled and she is eating more. No bowel movement yet and has a cough (mostly non productive).  Objective: Vital signs in last 24 hours: Temp:  [98.2 F (36.8 C)-99.8 F (37.7 C)] 99.2 F (37.3 C) (10/30 0400) Pulse Rate:  [103-121] 121 (10/30 0400) Cardiac Rhythm:  [-] Sinus tachycardia (10/29 2000) Resp:  [22-29] 29 (10/30 0400) BP: (109-134)/(54-67) 134/60 mmHg (10/30 0400) SpO2:  [91 %-97 %] 91 % (10/30 0400)     Intake/Output from previous day: 10/29 0701 - 10/30 0700 In: 840 [P.O.:840] Out: 350 [Urine:350]   Physical Exam:  Cardiovascular: RRR Pulmonary: Clear to auscultation bilaterally; no rales, wheezes, or rhonchi. Abdomen: Soft, non tender, bowel sounds present. Wounds: Clean and dry.  No signs of infection   Lab Results: CBC:  Recent Labs  11/06/12 0857  WBC 17.1*  HGB 12.7  HCT 37.5  PLT 301   BMET:   Recent Labs  11/06/12 0857  NA 141  K 4.1  CL 105  CO2 26  GLUCOSE 171*  BUN 16  CREATININE 0.78  CALCIUM 9.2    PT/INR:   Recent Labs  11/06/12 0857  LABPROT 13.0  INR 1.00   ABG:  INR: Will add last result for INR, ABG once components are confirmed Will add last 4 CBG results once components are confirmed  Assessment/Plan:  1. CV - SR 2.  Pulmonary - Chest tube removed yesterday. 3. DM- CBGs 190/199/167. On Metformin 500 bid, insulin. Will need follow up as an outpatient. 3. Lactulose this am 4.Robitussin PRN cough 5.Likely discharge in am  ZIMMERMAN,DONIELLE MPA-C 11/09/2012,7:48 AM    Chart reviewed, patient examined, agree with above. Pathology still pending. She will need to follow up with Dr. Clelia Croft in the next week or so.

## 2012-11-10 ENCOUNTER — Inpatient Hospital Stay (HOSPITAL_COMMUNITY): Payer: BC Managed Care – PPO

## 2012-11-10 LAB — GLUCOSE, CAPILLARY: Glucose-Capillary: 206 mg/dL — ABNORMAL HIGH (ref 70–99)

## 2012-11-10 MED ORDER — OXYCODONE-ACETAMINOPHEN 5-325 MG PO TABS: 1.0000 | ORAL_TABLET | ORAL | Status: DC | PRN

## 2012-11-10 NOTE — Progress Notes (Signed)
[facility] DAILY ICU PROGRESS NOTE                   [address]            [place] [address]          [phone]   4 Days Post-Op Procedure(s) (LRB): MEDIASTINOTOMY CHAMBERLAIN MCNEILPROCEDURE (Right)  Total Length of Stay:  LOS: 4 days   Subjective: Feels ok  Objective: Vital signs in last 24 hours: Temp:  [98 F (36.7 C)-98.8 F (37.1 C)] 98 F (36.7 C) (10/31 0700) Pulse Rate:  [107-120] 107 (10/31 0700) Cardiac Rhythm:  [-] Sinus tachycardia (10/31 0819) Resp:  [17-30] 23 (10/31 0700) BP: (101-133)/(48-72) 133/62 mmHg (10/31 0700) SpO2:  [90 %-97 %] 93 % (10/31 0700)  Filed Weights   11/06/12 0818  Weight: 341 lb 0.8 oz (154.7 kg)    Weight change:    Hemodynamic parameters for last 24 hours:    Intake/Output from previous day: 10/30 0701 - 10/31 0700 In: 1200 [P.O.:1200] Out: 5 [Urine:5]  Intake/Output this shift: Total I/O In: 360 [P.O.:360] Out: -   Current Meds: Scheduled Meds: . bisacodyl  10 mg Oral Daily  . enoxaparin (LOVENOX) injection  40 mg Subcutaneous Q24H  . ferrous fumarate  1 tablet Oral Daily  . furosemide  10 mg Oral Daily  . insulin aspart  0-24 Units Subcutaneous Q4H  . insulin aspart  8 Units Subcutaneous TID WC  . insulin glargine  30 Units Subcutaneous QHS  . lisinopril  10 mg Oral Daily  . metFORMIN  500 mg Oral BID WC  . mupirocin ointment   Nasal BID  . simvastatin  40 mg Oral QHS   Continuous Infusions:  PRN Meds:.albuterol, guaiFENesin, HYDROcodone-homatropine, lactulose, morphine injection, ondansetron (ZOFRAN) IV, oxyCODONE-acetaminophen, oxymetazoline, potassium chloride, senna-docusate, traMADol  General appearance: alert, cooperative and no distress Heart: regular rate and rhythm Lungs: clear to auscultation bilaterally Abdomen: benign Extremities: no edema Wound: incis healing well  Lab Results: CBC:No results found for this basename: WBC, HGB, HCT, PLT,  in the last 72 hours BMET: No  results found for this basename: NA, K, CL, CO2, GLUCOSE, BUN, CREATININE, CALCIUM,  in the last 72 hours  PT/INR: No results found for this basename: LABPROT, INR,  in the last 72 hours Radiology: Dg Chest 2 View  11/10/2012   CLINICAL DATA:  Mediastinal mass, shortness of breath  EXAM: CHEST  2 VIEW  COMPARISON:  11/09/2012  FINDINGS: Large right mediastinal mass again evident obscuring the right hilum. Stable heart size. Scattered perihilar and bibasilar atelectasis unchanged. No enlarging effusion or pneumothorax. No significant interval change.  IMPRESSION: Stable bilateral atelectasis pattern.  Large right mediastinal mass, unchanged.   Electronically Signed   By: Ruel Favors M.D.   On: 11/10/2012 07:53   Dg Chest 2 View  11/09/2012   CLINICAL DATA:  removal of chest tube, right side air space disease  EXAM: CHEST  2 VIEW  COMPARISON:  DG CHEST 1V PORT dated 11/08/2012; CT ANGIO CHEST W/CM &/OR WO/CM dated 10/17/2012; DG CHEST 1V PORT dated 11/08/2012; DG CHEST 1V PORT dated 11/07/2012  FINDINGS: Normal cardiac silhouette. Large right perihilar mass is again demonstrated. There is atelectasis within the mid left mid and right lung which is similar prior. There is some improvement in aeration at lung bases compared to prior. Lateral projection demonstrates increased density over the lower thoracic spine.  IMPRESSION: 1. Increased density of the lower thoracic spine  seen on lateral projection could represent right lower lobe pneumonia. 2. Stable large right perihilar mass. 3. Improvement in aeration to the left and  right lung base.   Electronically Signed   By: Genevive Bi M.D.   On: 11/09/2012 12:33     Assessment/Plan: S/P Procedure(s) (LRB): MEDIASTINOTOMY CHAMBERLAIN MCNEILPROCEDURE (Right) Plan for discharge: see discharge orders     Kaitlin [name] E 11/10/2012 12:42 PM

## 2012-11-10 NOTE — Progress Notes (Signed)
Pt discharged per MD order. All discharge instructions reviewed and all questions answered.  

## 2012-11-14 ENCOUNTER — Ambulatory Visit (HOSPITAL_BASED_OUTPATIENT_CLINIC_OR_DEPARTMENT_OTHER): Payer: BC Managed Care – PPO | Admitting: Oncology

## 2012-11-14 ENCOUNTER — Other Ambulatory Visit: Payer: Self-pay | Admitting: *Deleted

## 2012-11-14 VITALS — BP 124/74 | HR 108 | Temp 98.4°F | Resp 21 | Ht 69.0 in | Wt 341.1 lb

## 2012-11-14 DIAGNOSIS — R718 Other abnormality of red blood cells: Secondary | ICD-10-CM

## 2012-11-14 DIAGNOSIS — D4989 Neoplasm of unspecified behavior of other specified sites: Secondary | ICD-10-CM

## 2012-11-14 DIAGNOSIS — D150 Benign neoplasm of thymus: Secondary | ICD-10-CM

## 2012-11-14 NOTE — Progress Notes (Signed)
Hematology and Oncology Follow Up Visit  Kaitlin [name] [id number] Sep 21, 1965 47 y.o. 11/14/2012 1:44 PM   Principle Diagnosis: 47 year old with iron deficiency anemia and lymphocytosis diagnosed in 07/2011. Now has a new finding as a chest mass.  Current therapy: She is under evaluation for treatment for anterior mediastinal mass.  Interim History: Kaitlin [name] presents today for a follow up visit.  Since her last visit, she was evaluated by Dr. Laneta Simmers and underwent anterior mediastinotomy and a biopsy of her mediastinal mass on 11/06/2012. She tolerated the procedure well and did not have any complications. She is not reporting any shortness of breath or chest pain. Her congestion has improved and not reporting any fevers. has She is not reporting any headaches or dizziness. Is not reporting any pruritus or any other constitutional symptoms. She is scheduled to have a PET CT scan done on 11/15/2012.  Medications: I have reviewed the patient's current medications.  Current Outpatient Prescriptions  Medication Sig Dispense Refill  . albuterol (PROVENTIL HFA;VENTOLIN HFA) 108 (90 BASE) MCG/ACT inhaler Inhale 2 puffs into the lungs every 4 (four) hours as needed for wheezing.  1 Inhaler  0  . ferrous fumarate (HEMOCYTE - 106 MG FE) 325 (106 FE) MG TABS tablet Take 1 tablet by mouth daily.      . furosemide (LASIX) 20 MG tablet Take 10 mg by mouth daily.      Marland Kitchen HYDROcodone-homatropine (HYCODAN) 5-1.5 MG/5ML syrup Take 2.5 mLs by mouth every 6 (six) hours as needed for cough.  30 mL  0  . ibuprofen (ADVIL,MOTRIN) 200 MG tablet Take 600 mg by mouth every 8 (eight) hours as needed. For pain.      Marland Kitchen insulin glargine (LANTUS) 100 UNIT/ML injection Inject 30 Units into the skin at bedtime.      . insulin regular (NOVOLIN R,HUMULIN R) 100 units/mL injection Inject into the skin 3 (three) times daily before meals.      Marland Kitchen lisinopril (PRINIVIL,ZESTRIL) 10 MG tablet Take 10 mg by mouth daily.      . metFORMIN  (GLUCOPHAGE) 500 MG tablet Take 500 mg by mouth 2 (two) times daily with a meal.      . Multiple Vitamin (MULTIVITAMIN WITH MINERALS) TABS Take 1 tablet by mouth daily.      . Multiple Vitamins-Minerals (IMMUNE SUPPORT VITAMIN C) PACK Take 1 Package by mouth daily.      Marland Kitchen oxyCODONE-acetaminophen (PERCOCET/ROXICET) 5-325 MG per tablet Take 1-2 tablets by mouth every 4 (four) hours as needed.  30 tablet  0  . simvastatin (ZOCOR) 40 MG tablet Take 40 mg by mouth at bedtime.       No current facility-administered medications for this visit.    Allergies:  Allergies  Allergen Reactions  . Amoxicillin-Pot Clavulanate Diarrhea    Past Medical History, Surgical history, Social history, and Family History were reviewed and updated.  Review of Systems:  Remaining ROS negative. Physical Exam: Blood pressure 124/74, pulse 108, temperature 98.4 F (36.9 C), temperature source Oral, resp. rate 21, height 5\' 9"  (1.753 m), weight 341 lb 1.6 oz (154.722 kg), last menstrual period 06/17/2012, SpO2 97.00%. ECOG: 1 General appearance: alert Head: Normocephalic, without obvious abnormality, atraumatic Neck: no adenopathy, no carotid bruit, no JVD, supple, symmetrical, trachea midline and thyroid not enlarged, symmetric, no tenderness/mass/nodules Lymph nodes: Cervical, supraclavicular, and axillary nodes normal. Heart:regular rate and rhythm, S1, S2 normal, no murmur, click, rub or gallop Lung:chest clear, no wheezing, rales, normal symmetric air entry  Abdomin: soft, non-tender, without masses or organomegaly EXT:no erythema, induration, or nodules   Lab Results: Lab Results  Component Value Date   WBC 17.1* 11/06/2012   HGB 12.7 11/06/2012   HCT 37.5 11/06/2012   MCV 75.5* 11/06/2012   PLT 301 11/06/2012     Chemistry      Component Value Date/Time   NA 141 11/06/2012 0857   NA 141 03/29/2012 0945   K 4.1 11/06/2012 0857   K 4.1 03/29/2012 0945   CL 105 11/06/2012 0857   CL 108*  03/29/2012 0945   CO2 26 11/06/2012 0857   CO2 25 03/29/2012 0945   BUN 16 11/06/2012 0857   BUN 14.1 03/29/2012 0945   CREATININE 0.78 11/06/2012 0857   CREATININE 1.1 03/29/2012 0945      Component Value Date/Time   CALCIUM 9.2 11/06/2012 0857   CALCIUM 8.9 03/29/2012 0945   ALKPHOS 92 11/06/2012 0857   ALKPHOS 90 03/29/2012 0945   AST 18 11/06/2012 0857   AST 23 03/29/2012 0945   ALT 20 11/06/2012 0857   ALT 31 03/29/2012 0945   BILITOT 0.3 11/06/2012 0857   BILITOT 0.23 03/29/2012 0945        Impression and Plan:  47 year old woman with the following issues: 1. New finding of a large anterior mediastinal mass measuring 9.6 cm. she is status post a mediastinal biopsy on 11/06/2012. The results of the pathology was discussed today with the patient in detail. The pathology indicate lymphocyte predominant thymoma (B1). The natural course of this disease as well as the treatment options were discussed today with the patient. Her first up in terms of management is for her to obtain a PET CT scan for staging purposes. Once that is completed, she will likely need to be presented in the thoracic oncology tumor Board were a multidisciplinary discussion regarding the management options. This is most likely represents a surgical disease given her age and the presentation with possibly need for additional therapy after resection unless she has widely disseminated disease which doesn't seem to be the case. After her case is been discussed, I will arrange followup for her depending on the plan of treatment.     2. A microcytosis: likely due to mild iron deficiency anemia. This appears to be mild in nature.  3. Cough and congestion: Has improved dramatically and feeling a lot better. Likely represents a URI and less likely related to her chest mass.    Kaitlin Hose, MD 11/4/20141:44 PM

## 2012-11-15 ENCOUNTER — Encounter (HOSPITAL_COMMUNITY)
Admission: RE | Admit: 2012-11-15 | Discharge: 2012-11-15 | Disposition: A | Discharge status: Home / Self Care | Payer: BC Managed Care – PPO | Source: Ambulatory Visit | Attending: Oncology | Admitting: Oncology

## 2012-11-15 DIAGNOSIS — J90 Pleural effusion, not elsewhere classified: Secondary | ICD-10-CM | POA: Insufficient documentation

## 2012-11-15 DIAGNOSIS — J392 Other diseases of pharynx: Secondary | ICD-10-CM | POA: Insufficient documentation

## 2012-11-15 DIAGNOSIS — R7309 Other abnormal glucose: Secondary | ICD-10-CM | POA: Insufficient documentation

## 2012-11-15 DIAGNOSIS — D150 Benign neoplasm of thymus: Secondary | ICD-10-CM | POA: Insufficient documentation

## 2012-11-15 DIAGNOSIS — R911 Solitary pulmonary nodule: Secondary | ICD-10-CM | POA: Insufficient documentation

## 2012-11-15 LAB — GLUCOSE, CAPILLARY: Glucose-Capillary: 171 mg/dL — ABNORMAL HIGH (ref 70–99)

## 2012-11-15 MED ORDER — FLUDEOXYGLUCOSE F - 18 (FDG) INJECTION
18.6000 | Freq: Once | INTRAVENOUS | Status: AC | PRN
  Administered 2012-11-15: 18.6 via INTRAVENOUS

## 2012-11-16 ENCOUNTER — Other Ambulatory Visit: Payer: Self-pay | Admitting: Oncology

## 2012-11-16 DIAGNOSIS — J9859 Other diseases of mediastinum, not elsewhere classified: Secondary | ICD-10-CM

## 2012-11-16 DIAGNOSIS — J392 Other diseases of pharynx: Secondary | ICD-10-CM

## 2012-11-17 ENCOUNTER — Telehealth: Payer: Self-pay | Admitting: Oncology

## 2012-11-17 NOTE — Telephone Encounter (Signed)
lvm for opt regarding to ENT with contact info adn address and MD name.Marland KitchenMarland KitchenPt sched to see Dr. Cloria Spring NOv 12th @ 2:40pm

## 2012-11-27 ENCOUNTER — Other Ambulatory Visit: Payer: Self-pay | Admitting: *Deleted

## 2012-11-27 DIAGNOSIS — J9859 Other diseases of mediastinum, not elsewhere classified: Secondary | ICD-10-CM

## 2012-11-28 ENCOUNTER — Encounter (HOSPITAL_COMMUNITY): Payer: Self-pay | Admitting: Pharmacy Technician

## 2012-11-29 ENCOUNTER — Other Ambulatory Visit: Payer: Self-pay | Admitting: *Deleted

## 2012-11-29 ENCOUNTER — Ambulatory Visit (INDEPENDENT_AMBULATORY_CARE_PROVIDER_SITE_OTHER): Payer: BC Managed Care – PPO | Admitting: Surgery

## 2012-11-29 ENCOUNTER — Encounter (HOSPITAL_COMMUNITY)
Admission: RE | Admit: 2012-11-29 | Discharge: 2012-11-29 | Disposition: A | Discharge status: Home / Self Care | Payer: BC Managed Care – PPO | Source: Ambulatory Visit | Attending: Otolaryngology | Admitting: Otolaryngology

## 2012-11-29 ENCOUNTER — Encounter: Payer: Self-pay | Admitting: Surgery

## 2012-11-29 ENCOUNTER — Encounter (HOSPITAL_COMMUNITY): Payer: Self-pay

## 2012-11-29 ENCOUNTER — Ambulatory Visit
Admission: RE | Admit: 2012-11-29 | Discharge: 2012-11-29 | Disposition: A | Discharge status: Home / Self Care | Payer: Medicaid Other | Source: Ambulatory Visit | Attending: Surgery | Admitting: Surgery

## 2012-11-29 ENCOUNTER — Other Ambulatory Visit (HOSPITAL_COMMUNITY): Payer: Self-pay | Admitting: Otolaryngology

## 2012-11-29 VITALS — BP 138/79 | HR 110 | Resp 20 | Ht 69.0 in | Wt 340.0 lb

## 2012-11-29 DIAGNOSIS — J9859 Other diseases of mediastinum, not elsewhere classified: Secondary | ICD-10-CM

## 2012-11-29 DIAGNOSIS — D4989 Neoplasm of unspecified behavior of other specified sites: Secondary | ICD-10-CM

## 2012-11-29 DIAGNOSIS — D150 Benign neoplasm of thymus: Secondary | ICD-10-CM | POA: Insufficient documentation

## 2012-11-29 LAB — CBC
HCT: 38.2 % (ref 36.0–46.0)
Hemoglobin: 13.3 g/dL (ref 12.0–15.0)
MCH: 26.1 pg (ref 26.0–34.0)
MCHC: 34.8 g/dL (ref 30.0–36.0)
MCV: 75 fL — ABNORMAL LOW (ref 78.0–100.0)
Platelets: 282 10*3/uL (ref 150–400)
RBC: 5.09 MIL/uL (ref 3.87–5.11)
RDW: 16.4 % — ABNORMAL HIGH (ref 11.5–15.5)
WBC: 16.7 10*3/uL — ABNORMAL HIGH (ref 4.0–10.5)

## 2012-11-29 LAB — BASIC METABOLIC PANEL
BUN: 15 mg/dL (ref 6–23)
CO2: 28 mEq/L (ref 19–32)
Chloride: 106 mEq/L (ref 96–112)
Creatinine, Ser: 0.88 mg/dL (ref 0.50–1.10)
GFR calc Af Amer: >90 mL/min (ref >90)
Potassium: 4 mEq/L (ref 3.5–5.1)
Sodium: 144 mEq/L (ref 135–145)

## 2012-11-29 LAB — HCG, SERUM, QUALITATIVE: Preg, Serum: NEGATIVE

## 2012-11-29 MED ORDER — VANCOMYCIN HCL IN DEXTROSE 1-5 GM/200ML-% IV SOLN
1000.0000 mg | INTRAVENOUS | Status: DC
  Filled 2012-11-29: qty 200

## 2012-11-29 NOTE — Progress Notes (Addendum)
Have no orders @ PAT appt.  Will need to sign permit DOS.  DA Have called the office @ 8:35 and left a message with his nurse for orders.  DA WBC is up--left message for Dr. Lazarus Salines to view.   DA

## 2012-11-29 NOTE — Pre-Procedure Instructions (Addendum)
Kaitlin [name]  11/29/2012   Your procedure is scheduled on: Thursday, Nov. 20th    Report to [facility]  2 * 3 at 5:30 AM.   Call this number if you have problems the morning of surgery: [phone]   Remember:   Do not eat food or drink liquids after midnight tonight.   Take these medicines the morning of surgery with A SIP OF WATER: Pain medication, and inhalers   Do not wear jewelry, make-up or nail polish.  Do not wear lotions, powders, or perfumes. You may wear deodorant.  Do not shave 48 hours prior to surgery. Do not bring valuables to the hospital.  [facility] is not responsible for any belongings or valuables.               Contacts, dentures or bridgework may not be worn into surgery.  Leave suitcase in the car. After surgery it may be brought to your room.  For patients admitted to the hospital, discharge time is determined by your  treatment team.               Patients discharged the day of surgery will not be allowed to drive home.   Name and phone number of your driver:    Special Instructions: Shower using CHG 2 nights before surgery and the night before surgery.  If you shower the day of surgery use CHG.  Use special wash - you have one bottle of CHG for all showers.  You should use approximately 1/3 of the bottle for each shower.   Please read over the following fact sheets that you were given: Pain Booklet and Surgical Site Infection Prevention

## 2012-11-29 NOTE — H&P (Signed)
Kaitlin [name], Kaitlin [name] 47 y.o., female [id number]     Chief Complaint:  RIGHT tonsil asymmetric enlargement  HPI: 47 year old black female had some difficulty breathing.  She went to the emergency room where a chest x-ray showed an upper mediastinal mass.  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  A PET scan 2 weeks ago showed activity in this lesion, some symmetric activity in the nasopharynx, and asymmetric enlargement and activity in the RIGHT oropharynx.  She is unaware of anything wrong with her throat.  No breathing or swallowing difficulty.  No pain.  She does not smoke.  No obvious neck masses.  Two day recheck for preoperative evaluation.  I discussed the need for a tissue biopsy on her asymmetric tonsil.  She relates that she did in fact have a tonsillectomy in her youth.  I explained this to be a tonsil tag.  It did not appear like typical throat cancer, but could be a lymphoma, or could be benign.   Because of the difficult anatomy I need to do her under anesthesia.  Because of the sleep apnea, presumed, we will observe for 23 hour postop.   I discussed the procedure namely RIGHT tonsil biopsy versus tonsillectomy with frozen section.  Risks and complications were discussed.  Questions were answered and informed consent was obtained.  I talked with her about advancement of diet and activity postop.  I gave her a prescription for liquid hydrocodone for pain relief.  Recheck here one to 2 weeks postop.  PMH: Past Medical History  Diagnosis Date  . Diabetes mellitus   . High cholesterol   . Leukocytosis   . Anemia   . Obesity   . Sickle cell trait   . Hypertension     takes medicine to protect kidneys, does not have HTN  . Bronchitis   . Pneumonia     Surg Hx: Past Surgical History  Procedure Laterality Date  . Tonsillectomy    . Tubal ligation    . Mediastinotomy chamberlain mcneil Right 11/06/2012    Procedure: MEDIASTINOTOMY CHAMBERLAIN  MCNEILPROCEDURE;  Surgeon: Alleen Borne, MD;  Location: [facility] OR;  Service: Thoracic;  Laterality: Right;    FHx:   Family History  Problem Relation Age of Onset  . Heart disease Father   . Diabetes type II      Family HX   SocHx:  reports that she has never smoked. She has never used smokeless tobacco. She reports that she does not drink alcohol or use illicit drugs.  ALLERGIES:  Allergies  Allergen Reactions  . Amoxicillin-Pot Clavulanate Diarrhea     (Not in a hospital admission)  Results for orders placed during the hospital encounter of 11/29/12 (from the past 48 hour(s))  CBC     Status: Abnormal   Collection Time    11/29/12  8:24 AM      Result Value Range   WBC 16.7 (*) 4.0 - 10.5 K/uL   RBC 5.09  3.87 - 5.11 MIL/uL   Hemoglobin 13.3  12.0 - 15.0 g/dL   HCT 40.9  81.1 - 91.4 %   MCV 75.0 (*) 78.0 - 100.0 fL   MCH 26.1  26.0 - 34.0 pg   MCHC 34.8  30.0 - 36.0 g/dL   RDW 78.2 (*) 95.6 - 21.3 %   Platelets 282  150 - 400 K/uL  BASIC METABOLIC PANEL     Status: Abnormal   Collection Time    11/29/12  8:24 AM      Result Value Range   Sodium 144  135 - 145 mEq/L   Potassium 4.0  3.5 - 5.1 mEq/L   Chloride 106  96 - 112 mEq/L   CO2 28  19 - 32 mEq/L   Glucose, Bld 135 (*) 70 - 99 mg/dL   BUN 15  6 - 23 mg/dL   Creatinine, Ser 1.61  0.50 - 1.10 mg/dL   Calcium 9.9  8.4 - 09.6 mg/dL   GFR calc non Af Amer 78 (*) >90 mL/min   GFR calc Af Amer >90  >90 mL/min   Comment: (NOTE)     The eGFR has been calculated using the CKD EPI equation.     This calculation has not been validated in all clinical situations.     eGFR's persistently <90 mL/min signify possible Chronic Kidney     Disease.  HCG, SERUM, QUALITATIVE     Status: None   Collection Time    11/29/12  8:24 AM      Result Value Range   Preg, Serum NEGATIVE  NEGATIVE   Comment:            THE SENSITIVITY OF THIS     METHODOLOGY IS >10 mIU/mL.   Dg Chest 2 View  11/29/2012   CLINICAL DATA:  Followup  mediastinal mass  EXAM: CHEST  2 VIEW  COMPARISON:  11/15/2012 PET scan, 11/10/2012 chest radiograph  FINDINGS: The heart size and vascular pattern are normal. No consolidation or effusion. Large right mediastinal mass present and unchanged in appearance when compared to prior study.  IMPRESSION: Large stable mediastinal   Electronically Signed   By: Esperanza Heir M.D.   On: 11/29/2012 13:19    EAV:[id number]: Feeling tired (fatigue).  No fever.  Night sweats.  No recent weight loss. Head: No headache. Eyes: No eye symptoms. Otolaryngeal: No hearing loss, no earache, the ears do not feel pressured , stopped up, the ears do not feel full, no tinnitus, and no purulent nasal discharge.  No nasal passage blockage (stuffiness).  Snoring.  No sneezing, no hoarseness, and no sore throat. Cardiovascular: Chest pain or discomfort.  No palpitations. Pulmonary: Dyspnea, cough, and wheezing. Gastrointestinal: No dysphagia  and no heartburn.  No nausea, no abdominal pain, and no melena.  No diarrhea. Genitourinary: No dysuria. Endocrine: No muscle weakness. Musculoskeletal: No calf muscle cramps  and no arthralgias.  Soft tissue swelling. Neurological: No dizziness, no fainting, no tingling, and no numbness. Psychological: No anxiety  and no depression. Skin: No rash.  Last menstrual period 06/17/2012.  PHYSICAL EXAM:  BP:119/77,  HR: 50 b/min,  Height: 5 ft 9 in, Weight: 340 lb , BMI: 50.2 kg/m2,   She is very heavyset.  Mental status is appropriate.  She hears well in conversational speech.  Voice is clear and respirations unlabored through the nose.  The head is atraumatic and neck supple.  Cranial nerves intact.  Ear canals are clear with normal drums.  Anterior nose is slightly corrugated but basically moist and patent.  Oral cavity is moist with a very bulky muscular tone.  She has teeth in fair to poor repair.  Oropharynx is minimally seen secondary to the tongue.  There appears to be some  enlargement of the RIGHT tonsil.  Neck without adenopathy.   I was able to palpate the oropharynx with some difficulty and some gagging.  The RIGHT tonsil is large and rubbery.   Using the flexible laryngoscope, or  small residual adenoids in the midline.  The RIGHT tonsil is smooth and bulky especially at its inferior pole.  The larynx looks normal.  Lungs: Somewhat distant but clear to auscultation Heart: Regular rate and rhythm without murmurs Abdomen: Obese, active Extremities: Normal configuration Neurologic: Symmetric, intact.  Studies Reviewed:  PET/CT scan.  No tracheal compromise.     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 Diabetes (250.00) (E11.9). 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  This is a relatively minor procedure we are doing.  I cannot reach your tonsil here in the office, so I will put you  to sleep briefly.  Because you may have sleep apnea, we will observe you overnight in the hospital and I will come back and send you home the following morning.  Hydrocodone liquid should be plenty for pain.  You can advance diet and activity as comfortable.  Recheck here in my office one to 2 weeks postop.  Hydrocodone-Acetaminophen 7.5-325 MG/15ML Oral Solution;10-20 ml po q4-6h prn pain; Qty300; R0; Rx.  Kaitlin [name], Kaitlin [name] 11/29/2012, 1:42 PM

## 2012-11-29 NOTE — Progress Notes (Signed)
HPI:  The patient is a 47 year old woman who was evaluated by Dr. Clelia Croft in July of 2013 for anemia and leukocytosis. She said that her primary care physician had noted an elevated white blood cell count for a while before that. Workup revealed normal flow cytometry and a high RDW and microcytosis. She says that she was in her usual state of health until about 2 weeks ago when she began developing chest pain, shortness of breath, and congestion. She reports a low-grade fever of 99-100 and some night sweats which she thought were due to menopause. She was seen in the emergency room and a CT scan the chest ruled out pulmonary emboli but did show a large anterior mediastinal mass measuring approximately 6 x 10 cm that could be a lymphoma, teratoma, or thymic tumor. She underwent mediastinotomy with biopsy on 11/06/2012. The frozen section was felt to be consistent with lymphoma but final pathology showed a thymoma. PET scan showed a hypermetabolic mediastinal mass. There was a 2.8 cm soft tissue lesion in the right oropharynx with an SUV of 11.3. This was evaluated by ENT and she is scheduled for an exam and biopsy under anesthesia tomorrow by Dr. Lazarus Salines.   Current Outpatient Prescriptions  Medication Sig Dispense Refill  . albuterol (PROVENTIL HFA;VENTOLIN HFA) 108 (90 BASE) MCG/ACT inhaler Inhale 2 puffs into the lungs every 4 (four) hours as needed for wheezing.  1 Inhaler  0  . ferrous fumarate (HEMOCYTE - 106 MG FE) 325 (106 FE) MG TABS tablet Take 1 tablet by mouth daily.      . furosemide (LASIX) 20 MG tablet Take 10 mg by mouth daily.      Marland Kitchen HYDROcodone-homatropine (HYCODAN) 5-1.5 MG/5ML syrup Take 2.5 mLs by mouth every 6 (six) hours as needed for cough.  30 mL  0  . ibuprofen (ADVIL,MOTRIN) 200 MG tablet Take 600 mg by mouth every 8 (eight) hours as needed. For pain.      Marland Kitchen insulin glargine (LANTUS) 100 UNIT/ML injection Inject 30 Units into the skin at bedtime.      . insulin regular  (NOVOLIN R,HUMULIN R) 100 units/mL injection Inject 30 Units into the skin 3 (three) times daily before meals.       Marland Kitchen lisinopril (PRINIVIL,ZESTRIL) 10 MG tablet Take 10 mg by mouth daily.      . metFORMIN (GLUCOPHAGE) 500 MG tablet Take 500 mg by mouth 2 (two) times daily with a meal.      . Multiple Vitamin (MULTIVITAMIN WITH MINERALS) TABS Take 1 tablet by mouth daily.      . Multiple Vitamins-Minerals (IMMUNE SUPPORT VITAMIN C) PACK Take 1 Package by mouth daily.      Marland Kitchen oxyCODONE-acetaminophen (PERCOCET/ROXICET) 5-325 MG per tablet Take 1-2 tablets by mouth every 4 (four) hours as needed for severe pain.      . simvastatin (ZOCOR) 40 MG tablet Take 40 mg by mouth at bedtime.      Marland Kitchen Specialty Vitamins Products (VITAMINS FOR HAIR PO) Take 1 tablet by mouth daily.       No current facility-administered medications for this visit.   Facility-Administered Medications Ordered in Other Visits  Medication Dose Route Frequency Provider Last Rate Last Dose  . [START ON 11/30/2012] vancomycin (VANCOCIN) IVPB 1000 mg/200 mL premix  1,000 mg Intravenous On Call to OR Alleen Borne, MD         Physical Exam:  BP 138/79  Pulse 110  Resp 20  Ht 5'  9" (1.753 m)  Wt 340 lb (154.223 kg)  BMI 50.19 kg/m2  SpO2 98%  LMP 06/17/2012 She looks well The incision is healing well Lungs are clear. There is no cervical or supraclavicular adenopathy.  Diagnostic Tests:  CLINICAL DATA: Initial treatment strategy for thymoma.  EXAM:  NUCLEAR MEDICINE PET SKULL BASE TO THIGH  FASTING BLOOD GLUCOSE: Value: 171 mg/dl  TECHNIQUE:  21.3 mCi Y-86 FDG was injected intravenously. CT data was obtained  and used for attenuation correction and anatomic localization only.  (This was not acquired as a diagnostic CT examination.) Additional  exam technical data entered on technologist worksheet.  COMPARISON: CT chest dated 10/17/2012  FINDINGS:  Note: The study is limited by heterogeneous FDG uptake due to a    combination of body habitus, hyperglycemia, and streak artifact from  the patient's arms.  NECK  Hypermetabolism in the posterior nasopharynx, symmetric, likely  physiologic.  2.8 x 2.1 cm soft tissue lesion in the right oropharynx, adjacent to  the right pontine tonsil (series 2/ image 30), max SUV 11.3.  No hypermetabolic lymph nodes in the neck. Scattered small bilateral  cervical lymph nodes which do not meet pathologic CT size criteria.  CHEST  6.5 x 10.2 cm anterior mediastinal mass with calcifications (series  2/ image 70), corresponding to biopsy-proven thymoma, max SUV 12.7.  11 mm ground-glass nodule in the right upper lobe (series 2/ image  80), new, favored to be infectious/inflammatory. Additional linear  scarring in the anterior right upper lobe. Small right pleural  effusion, new.  No hypermetabolic mediastinal or hilar nodes.  ABDOMEN/PELVIS  Heterogeneous hypermetabolism within the liver and spleen, without  focal lesion.  No abnormal hypermetabolic activity within the pancreas and adrenal  glands.  Heterogeneous GI uptake.  No hypermetabolic lymph nodes in the abdomen or pelvis.  SKELETON  Heterogeneous osseous uptake without suspicious focal lesion to  suggest osseous metastasis.  IMPRESSION:  10.2 cm anterior mediastinal mass, corresponding to biopsy-proven  thymoma, max SUV 12.7.  11 mm ground-glass nodule in the right upper lobe, new, favored to  be infectious/inflammatory. Small right pleural effusion, new.  No findings specific for metastatic disease.  2.8 cm soft tissue lesion in the right oropharynx, max SUV 11.3. ENT  consultation with direct visualization is suggested to exclude a  head/neck primary lesion.  Electronically Signed  By: Charline Bills M.D.   Impression:  She is scheduled for exam and bx of the oropharyngeal lesion tomorrow. I have recommended resection or debulking of the thymoma through a median sternotomy. We will wait to see  what the oropharyngeal lesion is. She understands that she may have an incomplete resection the the thymoma if it is actively invading vital mediastinal structures and that she may require XRT and possibly chemo afterwards.  Plan:  We have scheduled her for December 5th at her request. Her husband is currently being treated for myeloma and is having a round of chemotherapy just before that.

## 2012-11-30 ENCOUNTER — Ambulatory Visit (HOSPITAL_COMMUNITY): Payer: BC Managed Care – PPO | Admitting: Critical Care Medicine

## 2012-11-30 ENCOUNTER — Encounter (HOSPITAL_COMMUNITY): Payer: Self-pay | Admitting: *Deleted

## 2012-11-30 ENCOUNTER — Ambulatory Visit (HOSPITAL_COMMUNITY)
Admission: RE | Admit: 2012-11-30 | Discharge: 2012-12-01 | Disposition: A | Discharge status: Home / Self Care | Payer: BC Managed Care – PPO | Source: Ambulatory Visit | Attending: Otolaryngology | Admitting: Otolaryngology

## 2012-11-30 ENCOUNTER — Encounter (HOSPITAL_COMMUNITY)
Admission: RE | Disposition: A | Discharge status: Home / Self Care | Payer: Self-pay | Source: Ambulatory Visit | Attending: Otolaryngology

## 2012-11-30 ENCOUNTER — Encounter (HOSPITAL_COMMUNITY): Payer: BC Managed Care – PPO | Admitting: Critical Care Medicine

## 2012-11-30 DIAGNOSIS — D649 Anemia, unspecified: Secondary | ICD-10-CM | POA: Insufficient documentation

## 2012-11-30 DIAGNOSIS — J358 Other chronic diseases of tonsils and adenoids: Secondary | ICD-10-CM | POA: Diagnosis present

## 2012-11-30 DIAGNOSIS — Z23 Encounter for immunization: Secondary | ICD-10-CM | POA: Insufficient documentation

## 2012-11-30 DIAGNOSIS — R0609 Other forms of dyspnea: Secondary | ICD-10-CM | POA: Insufficient documentation

## 2012-11-30 DIAGNOSIS — I10 Essential (primary) hypertension: Secondary | ICD-10-CM | POA: Insufficient documentation

## 2012-11-30 DIAGNOSIS — D4989 Neoplasm of unspecified behavior of other specified sites: Secondary | ICD-10-CM

## 2012-11-30 DIAGNOSIS — R220 Localized swelling, mass and lump, head: Secondary | ICD-10-CM | POA: Insufficient documentation

## 2012-11-30 DIAGNOSIS — D72829 Elevated white blood cell count, unspecified: Secondary | ICD-10-CM | POA: Insufficient documentation

## 2012-11-30 DIAGNOSIS — R0989 Other specified symptoms and signs involving the circulatory and respiratory systems: Secondary | ICD-10-CM | POA: Insufficient documentation

## 2012-11-30 DIAGNOSIS — Z794 Long term (current) use of insulin: Secondary | ICD-10-CM | POA: Insufficient documentation

## 2012-11-30 DIAGNOSIS — D571 Sickle-cell disease without crisis: Secondary | ICD-10-CM | POA: Insufficient documentation

## 2012-11-30 DIAGNOSIS — E109 Type 1 diabetes mellitus without complications: Secondary | ICD-10-CM | POA: Insufficient documentation

## 2012-11-30 DIAGNOSIS — E7800 Pure hypercholesterolemia, unspecified: Secondary | ICD-10-CM | POA: Insufficient documentation

## 2012-11-30 HISTORY — PX: LYMPH NODE BIOPSY: SHX201

## 2012-11-30 LAB — BLOOD GAS, ARTERIAL
Acid-Base Excess: 2.7 mmol/L — ABNORMAL HIGH (ref 0.0–2.0)
Bicarbonate: 27.4 mEq/L — ABNORMAL HIGH (ref 20.0–24.0)
Drawn by: 24610
FIO2: 0.21 %
O2 Saturation: 88.7 %
TCO2: 28.9 mmol/L (ref 0–100)
pCO2 arterial: 47.6 mmHg — ABNORMAL HIGH (ref 35.0–45.0)
pO2, Arterial: 59 mmHg — ABNORMAL LOW (ref 80.0–100.0)

## 2012-11-30 LAB — GLUCOSE, CAPILLARY
Glucose-Capillary: 157 mg/dL — ABNORMAL HIGH (ref 70–99)
Glucose-Capillary: 224 mg/dL — ABNORMAL HIGH (ref 70–99)

## 2012-11-30 LAB — COMPREHENSIVE METABOLIC PANEL
ALT: 18 U/L (ref 0–35)
Albumin: 3.2 g/dL — ABNORMAL LOW (ref 3.5–5.2)
Alkaline Phosphatase: 95 U/L (ref 39–117)
BUN: 15 mg/dL (ref 6–23)
CO2: 30 mEq/L (ref 19–32)
Chloride: 103 mEq/L (ref 96–112)
Potassium: 4.2 mEq/L (ref 3.5–5.1)
Sodium: 141 mEq/L (ref 135–145)
Total Bilirubin: 0.4 mg/dL (ref 0.3–1.2)
Total Protein: 6.3 g/dL (ref 6.0–8.3)

## 2012-11-30 LAB — CBC
HCT: 34.1 % — ABNORMAL LOW (ref 36.0–46.0)
Hemoglobin: 11.6 g/dL — ABNORMAL LOW (ref 12.0–15.0)
MCH: 25.6 pg — ABNORMAL LOW (ref 26.0–34.0)
MCHC: 34 g/dL (ref 30.0–36.0)
RBC: 4.54 MIL/uL (ref 3.87–5.11)
RDW: 16.3 % — ABNORMAL HIGH (ref 11.5–15.5)

## 2012-11-30 LAB — PROTIME-INR: Prothrombin Time: 12.3 seconds (ref 11.6–15.2)

## 2012-11-30 LAB — TYPE AND SCREEN: Antibody Screen: NEGATIVE

## 2012-11-30 SURGERY — LYMPH NODE BIOPSY
Anesthesia: General | Site: Mouth | Laterality: Right | Wound class: Clean Contaminated

## 2012-11-30 MED ORDER — FENTANYL CITRATE 0.05 MG/ML IJ SOLN
INTRAMUSCULAR | Status: DC | PRN
  Administered 2012-11-30: 100 ug via INTRAVENOUS

## 2012-11-30 MED ORDER — HYDROCODONE-ACETAMINOPHEN 7.5-325 MG/15ML PO SOLN
10.0000 mL | ORAL | Status: DC | PRN
  Administered 2012-11-30 – 2012-12-01 (×5): 15 mL via ORAL
  Filled 2012-11-30 (×4): qty 15

## 2012-11-30 MED ORDER — LIDOCAINE-EPINEPHRINE 0.5 %-1:200000 IJ SOLN
INTRAMUSCULAR | Status: AC
  Filled 2012-11-30: qty 1

## 2012-11-30 MED ORDER — SIMVASTATIN 40 MG PO TABS
40.0000 mg | ORAL_TABLET | Freq: Every day | ORAL | Status: DC
  Administered 2012-11-30: 40 mg via ORAL
  Filled 2012-11-30 (×2): qty 1

## 2012-11-30 MED ORDER — HYDROMORPHONE HCL PF 1 MG/ML IJ SOLN
INTRAMUSCULAR | Status: AC
  Filled 2012-11-30: qty 1

## 2012-11-30 MED ORDER — METHYLENE BLUE 1 % INJ SOLN
INTRAMUSCULAR | Status: AC
  Filled 2012-11-30: qty 10

## 2012-11-30 MED ORDER — SUCCINYLCHOLINE CHLORIDE 20 MG/ML IJ SOLN
INTRAMUSCULAR | Status: DC | PRN
  Administered 2012-11-30: 120 mg via INTRAVENOUS

## 2012-11-30 MED ORDER — SODIUM CHLORIDE 0.45 % IV SOLN
INTRAVENOUS | Status: DC
  Administered 2012-11-30: 1000 mL via INTRAVENOUS

## 2012-11-30 MED ORDER — ALBUTEROL SULFATE HFA 108 (90 BASE) MCG/ACT IN AERS
2.0000 | INHALATION_SPRAY | RESPIRATORY_TRACT | Status: DC | PRN
  Filled 2012-11-30: qty 6.7

## 2012-11-30 MED ORDER — HEPARIN SODIUM (PORCINE) 5000 UNIT/ML IJ SOLN
5000.0000 [IU] | Freq: Three times a day (TID) | INTRAMUSCULAR | Status: DC
  Administered 2012-11-30 – 2012-12-01 (×3): 5000 [IU] via SUBCUTANEOUS
  Filled 2012-11-30 (×5): qty 1

## 2012-11-30 MED ORDER — ONDANSETRON HCL 4 MG PO TABS: 4.0000 mg | ORAL_TABLET | ORAL | Status: DC | PRN

## 2012-11-30 MED ORDER — HYDROCODONE-ACETAMINOPHEN 7.5-325 MG/15ML PO SOLN
ORAL | Status: AC
  Filled 2012-11-30: qty 15

## 2012-11-30 MED ORDER — OXYCODONE HCL 5 MG/5ML PO SOLN: 5.0000 mg | ORAL | Status: DC | PRN

## 2012-11-30 MED ORDER — PROMETHAZINE HCL 25 MG/ML IJ SOLN: 6.2500 mg | INTRAMUSCULAR | Status: DC | PRN

## 2012-11-30 MED ORDER — INSULIN ASPART 100 UNIT/ML ~~LOC~~ SOLN
SUBCUTANEOUS | Status: AC
  Filled 2012-11-30: qty 4

## 2012-11-30 MED ORDER — HYDROCODONE-HOMATROPINE 5-1.5 MG/5ML PO SYRP: 2.5000 mL | ORAL_SOLUTION | Freq: Four times a day (QID) | ORAL | Status: DC | PRN

## 2012-11-30 MED ORDER — LIDOCAINE-EPINEPHRINE 1 %-1:100000 IJ SOLN
INTRAMUSCULAR | Status: AC
  Filled 2012-11-30: qty 1

## 2012-11-30 MED ORDER — HYDROMORPHONE HCL PF 1 MG/ML IJ SOLN
0.2500 mg | INTRAMUSCULAR | Status: DC | PRN
  Administered 2012-11-30 (×2): 0.5 mg via INTRAVENOUS

## 2012-11-30 MED ORDER — LIDOCAINE-EPINEPHRINE 1 %-1:100000 IJ SOLN
INTRAMUSCULAR | Status: DC | PRN
  Administered 2012-11-30: 10 mL

## 2012-11-30 MED ORDER — PHENOL 1.4 % MT LIQD: 1.0000 | OROMUCOSAL | Status: DC | PRN

## 2012-11-30 MED ORDER — FERROUS FUMARATE 325 (106 FE) MG PO TABS
1.0000 | ORAL_TABLET | Freq: Every day | ORAL | Status: DC
  Administered 2012-12-01: 106 mg via ORAL
  Filled 2012-11-30: qty 1

## 2012-11-30 MED ORDER — METFORMIN HCL 500 MG PO TABS
500.0000 mg | ORAL_TABLET | Freq: Two times a day (BID) | ORAL | Status: DC
Start: 2012-11-30 — End: 2012-12-01
  Administered 2012-12-01: 500 mg via ORAL
  Filled 2012-11-30 (×4): qty 1

## 2012-11-30 MED ORDER — BACITRACIN ZINC 500 UNIT/GM EX OINT
TOPICAL_OINTMENT | CUTANEOUS | Status: AC
  Filled 2012-11-30: qty 15

## 2012-11-30 MED ORDER — PROPOFOL 10 MG/ML IV BOLUS
INTRAVENOUS | Status: DC | PRN
  Administered 2012-11-30: 50 mg via INTRAVENOUS
  Administered 2012-11-30: 200 mg via INTRAVENOUS

## 2012-11-30 MED ORDER — INSULIN GLARGINE 100 UNIT/ML ~~LOC~~ SOLN
30.0000 [IU] | Freq: Every day | SUBCUTANEOUS | Status: DC
  Filled 2012-11-30: qty 0.3

## 2012-11-30 MED ORDER — FUROSEMIDE 20 MG PO TABS
10.0000 mg | ORAL_TABLET | Freq: Every day | ORAL | Status: DC
  Administered 2012-12-01: 10 mg via ORAL
  Filled 2012-11-30 (×2): qty 0.5

## 2012-11-30 MED ORDER — INSULIN GLARGINE 100 UNIT/ML ~~LOC~~ SOLN
15.0000 [IU] | Freq: Every day | SUBCUTANEOUS | Status: DC
  Administered 2012-11-30: 15 [IU] via SUBCUTANEOUS
  Filled 2012-11-30 (×2): qty 0.15

## 2012-11-30 MED ORDER — ONDANSETRON HCL 4 MG/2ML IJ SOLN: 4.0000 mg | INTRAMUSCULAR | Status: DC | PRN

## 2012-11-30 MED ORDER — LIDOCAINE HCL (CARDIAC) 20 MG/ML IV SOLN
INTRAVENOUS | Status: DC | PRN
  Administered 2012-11-30: 80 mg via INTRAVENOUS

## 2012-11-30 MED ORDER — LISINOPRIL 10 MG PO TABS
10.0000 mg | ORAL_TABLET | Freq: Every day | ORAL | Status: DC
Start: 2012-11-30 — End: 2012-12-01
  Filled 2012-11-30 (×2): qty 1

## 2012-11-30 MED ORDER — OXYCODONE HCL 5 MG PO TABS: 5.0000 mg | ORAL_TABLET | Freq: Once | ORAL | Status: DC | PRN

## 2012-11-30 MED ORDER — INSULIN ASPART 100 UNIT/ML ~~LOC~~ SOLN
0.0000 [IU] | Freq: Three times a day (TID) | SUBCUTANEOUS | Status: DC
  Administered 2012-11-30: 4 [IU] via SUBCUTANEOUS
  Administered 2012-11-30 – 2012-12-01 (×2): 7 [IU] via SUBCUTANEOUS
  Administered 2012-12-01: 11 [IU] via SUBCUTANEOUS

## 2012-11-30 MED ORDER — MIDAZOLAM HCL 5 MG/5ML IJ SOLN
INTRAMUSCULAR | Status: DC | PRN
  Administered 2012-11-30: 2 mg via INTRAVENOUS

## 2012-11-30 MED ORDER — PHENYLEPHRINE HCL 10 MG/ML IJ SOLN
INTRAMUSCULAR | Status: DC | PRN
  Administered 2012-11-30 (×4): 80 ug via INTRAVENOUS
  Administered 2012-11-30 (×2): 120 ug via INTRAVENOUS

## 2012-11-30 MED ORDER — ONDANSETRON HCL 4 MG/2ML IJ SOLN
INTRAMUSCULAR | Status: DC | PRN
  Administered 2012-11-30: 4 mg via INTRAVENOUS

## 2012-11-30 MED ORDER — PNEUMOCOCCAL VAC POLYVALENT 25 MCG/0.5ML IJ INJ
0.5000 mL | INJECTION | INTRAMUSCULAR | Status: AC
  Administered 2012-12-01: 0.5 mL via INTRAMUSCULAR
  Filled 2012-11-30: qty 0.5

## 2012-11-30 MED ORDER — LACTATED RINGERS IV SOLN
INTRAVENOUS | Status: DC | PRN
  Administered 2012-11-30: 07:00:00 via INTRAVENOUS

## 2012-11-30 MED ORDER — 0.9 % SODIUM CHLORIDE (POUR BTL) OPTIME
TOPICAL | Status: DC | PRN
  Administered 2012-11-30: 1000 mL

## 2012-11-30 MED ORDER — OXYCODONE HCL 5 MG/5ML PO SOLN: 5.0000 mg | Freq: Once | ORAL | Status: DC | PRN

## 2012-11-30 SURGICAL SUPPLY — 43 items
AIRSTRIP 4 3/4X3 1/4 7185 (GAUZE/BANDAGES/DRESSINGS) IMPLANT
BANDAGE GAUZE ELAST BULKY 4 IN (GAUZE/BANDAGES/DRESSINGS) IMPLANT
CANISTER SUCTION 2500CC (MISCELLANEOUS) IMPLANT
CLEANER TIP ELECTROSURG 2X2 (MISCELLANEOUS) ×2 IMPLANT
CLOTH BEACON ORANGE TIMEOUT ST (SAFETY) ×2 IMPLANT
CONT SPEC 4OZ CLIKSEAL STRL BL (MISCELLANEOUS) ×2 IMPLANT
COVER SURGICAL LIGHT HANDLE (MISCELLANEOUS) ×2 IMPLANT
CRADLE DONUT ADULT HEAD (MISCELLANEOUS) IMPLANT
DRAIN PENROSE 1/4X12 LTX STRL (WOUND CARE) IMPLANT
DRSG EMULSION OIL 3X3 NADH (GAUZE/BANDAGES/DRESSINGS) IMPLANT
ELECT COATED BLADE 2.86 ST (ELECTRODE) ×2 IMPLANT
ELECT REM PT RETURN 9FT ADLT (ELECTROSURGICAL) ×2
ELECTRODE REM PT RTRN 9FT ADLT (ELECTROSURGICAL) ×1 IMPLANT
GLOVE ECLIPSE 8.0 STRL XLNG CF (GLOVE) ×2 IMPLANT
GOWN PREVENTION PLUS XLARGE (GOWN DISPOSABLE) ×2 IMPLANT
GOWN STRL NON-REIN LRG LVL3 (GOWN DISPOSABLE) ×2 IMPLANT
KIT BASIN OR (CUSTOM PROCEDURE TRAY) ×2 IMPLANT
KIT ROOM TURNOVER OR (KITS) ×2 IMPLANT
LOCATOR NERVE 3 VOLT (DISPOSABLE) IMPLANT
NDL FILTER BLUNT 18X1 1/2 (NEEDLE) IMPLANT
NDL HYPO 25GX1X1/2 BEV (NEEDLE) IMPLANT
NEEDLE FILTER BLUNT 18X 1/2SAF (NEEDLE)
NEEDLE FILTER BLUNT 18X1 1/2 (NEEDLE) IMPLANT
NEEDLE HYPO 25GX1X1/2 BEV (NEEDLE) IMPLANT
NS IRRIG 1000ML POUR BTL (IV SOLUTION) ×2 IMPLANT
PAD ARMBOARD 7.5X6 YLW CONV (MISCELLANEOUS) ×4 IMPLANT
PENCIL BUTTON HOLSTER BLD 10FT (ELECTRODE) ×2 IMPLANT
RUBBERBAND STERILE (MISCELLANEOUS) IMPLANT
SPECIMEN JAR SMALL (MISCELLANEOUS) ×2 IMPLANT
SPONGE GAUZE 4X4 12PLY (GAUZE/BANDAGES/DRESSINGS) IMPLANT
STAPLER VISISTAT 35W (STAPLE) ×2 IMPLANT
SUT CHROMIC 3 0 PS 2 (SUTURE) ×2 IMPLANT
SUT CHROMIC 4 0 P 3 18 (SUTURE) ×2 IMPLANT
SUT ETHILON 6 0 P 1 (SUTURE) ×2 IMPLANT
SUT SILK 3 0 (SUTURE) ×2
SUT SILK 3-0 18XBRD TIE 12 (SUTURE) ×1 IMPLANT
SWAB COLLECTION DEVICE MRSA (MISCELLANEOUS) IMPLANT
SYR TB 1ML LUER SLIP (SYRINGE) IMPLANT
TOWEL OR 17X24 6PK STRL BLUE (TOWEL DISPOSABLE) ×2 IMPLANT
TOWEL OR 17X26 10 PK STRL BLUE (TOWEL DISPOSABLE) ×2 IMPLANT
TRAY ENT MC OR (CUSTOM PROCEDURE TRAY) ×2 IMPLANT
TUBE ANAEROBIC SPECIMEN COL (MISCELLANEOUS) IMPLANT
WATER STERILE IRR 1000ML POUR (IV SOLUTION) ×2 IMPLANT

## 2012-11-30 NOTE — Anesthesia Postprocedure Evaluation (Signed)
  Anesthesia Post-op Note  Patient: Kaitlin [name]  Procedure(s) Performed: Procedure(s): RIGHT TONSIL BIOPSY WITH FRESH FROZEN ANALYSIS (Right)  Patient Location: PACU  Anesthesia Type:General  Level of Consciousness: awake and alert   Airway and Oxygen Therapy: Patient Spontanous Breathing  Post-op Pain: mild  Post-op Assessment: Post-op Vital signs reviewed  Post-op Vital Signs: stable  Complications: No apparent anesthesia complications

## 2012-11-30 NOTE — Op Note (Signed)
11/30/2012  9:29 AM    Kaitlin [name]  [id number]   Pre-Op Dx:  Right tonsil mass  Post-op Dx: Same  Proc: Biopsy, right tonsil mass   Surg:  Flo Shanks T MD  Anes:  GOT  EBL:  None  Comp:  None  Findings:  Obese patient with very bulky tongue. Lymphoid elements in both tonsil fossae despite prior tonsillectomy. 2 x 3 cm focal asymmetric enlargement of the inferior pole of the right tonsil.  Frozen section reveals lymphoid elements.  Procedure: With the patient in a comfortable supine position, general orotracheal anesthesia was induced without difficulty. At an appropriate level, the table was turned 90 the patient placed in Trendelenburg. A clean preparation and draping was accomplished. Taking care to protect lips, teeth, and endotracheal tube, the Crowe Davis mouth gag was introduced, expanded for visualization, and suspended from the [employer] stand in the standard fashion. Several different sizes of gag were used. The findings were as described above. 1% Xylocaine with 1 100,000 epinephrine, 10 mL was infiltrated deep to the tonsil mass for intraoperative hemostasis. Several minutes were allowed for this to take effect.  The mass was grasped with an Allis forceps and amputated at its base using cutting and coagulating cautery.  A roughly 2 x 2 centimeter specimen was sent fresh to pathology. Hemostasis was observed. The mouth gag was relaxed.  The specimen returned as lymphoid with no definitive malignancy. No additional treatment was felt necessary.  The mouth gag was reexpanded and hemostasis was observed. The mouth gag was relaxed and removed. Dental status was intact. Patient was returned to anesthesia, awakened, extubated, and transferred to recovery in stable condition.   Dispo:   PACU to 3300, 23 hour observation for sleep apnea.  Plan:  Await final pathology reports. Analgesia. Diabetes management.  Cephus Richer MD

## 2012-11-30 NOTE — H&P (View-Only) (Signed)
[name],  Kaitlin [name] 47 y.o., female [id number]     Chief Complaint:  RIGHT tonsil asymmetric enlargement  HPI: 47-year-old black female had some difficulty breathing.  She went to the emergency room where a chest x-ray showed an upper mediastinal mass.  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  A PET scan 2 weeks ago showed activity in this lesion, some symmetric activity in the nasopharynx, and asymmetric enlargement and activity in the RIGHT oropharynx.  She is unaware of anything wrong with her throat.  No breathing or swallowing difficulty.  No pain.  She does not smoke.  No obvious neck masses.  Two day recheck for preoperative evaluation.  I discussed the need for a tissue biopsy on her asymmetric tonsil.  She relates that she did in fact have a tonsillectomy in her youth.  I explained this to be a tonsil tag.  It did not appear like typical throat cancer, but could be a lymphoma, or could be benign.   Because of the difficult anatomy I need to do her under anesthesia.  Because of the sleep apnea, presumed, we will observe for 23 hour postop.   I discussed the procedure namely RIGHT tonsil biopsy versus tonsillectomy with frozen section.  Risks and complications were discussed.  Questions were answered and informed consent was obtained.  I talked with her about advancement of diet and activity postop.  I gave her a prescription for liquid hydrocodone for pain relief.  Recheck here one to 2 weeks postop.  PMH: Past Medical History  Diagnosis Date  . Diabetes mellitus   . High cholesterol   . Leukocytosis   . Anemia   . Obesity   . Sickle cell trait   . Hypertension     takes medicine to protect kidneys, does not have HTN  . Bronchitis   . Pneumonia     Surg Hx: Past Surgical History  Procedure Laterality Date  . Tonsillectomy    . Tubal ligation    . Mediastinotomy chamberlain mcneil Right 11/06/2012    Procedure: MEDIASTINOTOMY CHAMBERLAIN  MCNEILPROCEDURE;  Surgeon: Bryan K Bartle, MD;  Location: [facility] OR;  Service: Thoracic;  Laterality: Right;    FHx:   Family History  Problem Relation Age of Onset  . Heart disease Father   . Diabetes type II      Family HX   SocHx:  reports that she has never smoked. She has never used smokeless tobacco. She reports that she does not drink alcohol or use illicit drugs.  ALLERGIES:  Allergies  Allergen Reactions  . Amoxicillin-Pot Clavulanate Diarrhea     (Not in a hospital admission)  Results for orders placed during the hospital encounter of 11/29/12 (from the past 48 hour(s))  CBC     Status: Abnormal   Collection Time    11/29/12  8:24 AM      Result Value Range   WBC 16.7 (*) 4.0 - 10.5 K/uL   RBC 5.09  3.87 - 5.11 MIL/uL   Hemoglobin 13.3  12.0 - 15.0 g/dL   HCT 38.2  36.0 - 46.0 %   MCV 75.0 (*) 78.0 - 100.0 fL   MCH 26.1  26.0 - 34.0 pg   MCHC 34.8  30.0 - 36.0 g/dL   RDW 16.4 (*) 11.5 - 15.5 %   Platelets 282  150 - 400 K/uL  BASIC METABOLIC PANEL     Status: Abnormal   Collection Time    11/29/12    8:24 AM      Result Value Range   Sodium 144  135 - 145 mEq/L   Potassium 4.0  3.5 - 5.1 mEq/L   Chloride 106  96 - 112 mEq/L   CO2 28  19 - 32 mEq/L   Glucose, Bld 135 (*) 70 - 99 mg/dL   BUN 15  6 - 23 mg/dL   Creatinine, Ser 0.88  0.50 - 1.10 mg/dL   Calcium 9.9  8.4 - 10.5 mg/dL   GFR calc non Af Amer 78 (*) >90 mL/min   GFR calc Af Amer >90  >90 mL/min   Comment: (NOTE)     The eGFR has been calculated using the CKD EPI equation.     This calculation has not been validated in all clinical situations.     eGFR's persistently <90 mL/min signify possible Chronic Kidney     Disease.  HCG, SERUM, QUALITATIVE     Status: None   Collection Time    11/29/12  8:24 AM      Result Value Range   Preg, Serum NEGATIVE  NEGATIVE   Comment:            THE SENSITIVITY OF THIS     METHODOLOGY IS >10 mIU/mL.   Dg Chest 2 View  11/29/2012   CLINICAL DATA:  Followup  mediastinal mass  EXAM: CHEST  2 VIEW  COMPARISON:  11/15/2012 PET scan, 11/10/2012 chest radiograph  FINDINGS: The heart size and vascular pattern are normal. No consolidation or effusion. Large right mediastinal mass present and unchanged in appearance when compared to prior study.  IMPRESSION: Large stable mediastinal   Electronically Signed   By: Raymond  Rubner M.D.   On: 11/29/2012 13:19    ROS:Systemic: Feeling tired (fatigue).  No fever.  Night sweats.  No recent weight loss. Head: No headache. Eyes: No eye symptoms. Otolaryngeal: No hearing loss, no earache, the ears do not feel pressured , stopped up, the ears do not feel full, no tinnitus, and no purulent nasal discharge.  No nasal passage blockage (stuffiness).  Snoring.  No sneezing, no hoarseness, and no sore throat. Cardiovascular: Chest pain or discomfort.  No palpitations. Pulmonary: Dyspnea, cough, and wheezing. Gastrointestinal: No dysphagia  and no heartburn.  No nausea, no abdominal pain, and no melena.  No diarrhea. Genitourinary: No dysuria. Endocrine: No muscle weakness. Musculoskeletal: No calf muscle cramps  and no arthralgias.  Soft tissue swelling. Neurological: No dizziness, no fainting, no tingling, and no numbness. Psychological: No anxiety  and no depression. Skin: No rash.  Last menstrual period 06/17/2012.  PHYSICAL EXAM:  BP:119/77,  HR: 50 b/min,  Height: 5 ft 9 in, Weight: 340 lb , BMI: 50.2 kg/m2,   She is very heavyset.  Mental status is appropriate.  She hears well in conversational speech.  Voice is clear and respirations unlabored through the nose.  The head is atraumatic and neck supple.  Cranial nerves intact.  Ear canals are clear with normal drums.  Anterior nose is slightly corrugated but basically moist and patent.  Oral cavity is moist with a very bulky muscular tone.  She has teeth in fair to poor repair.  Oropharynx is minimally seen secondary to the tongue.  There appears to be some  enlargement of the RIGHT tonsil.  Neck without adenopathy.   I was able to palpate the oropharynx with some difficulty and some gagging.  The RIGHT tonsil is large and rubbery.   Using the flexible laryngoscope, or   small residual adenoids in the midline.  The RIGHT tonsil is smooth and bulky especially at its inferior pole.  The larynx looks normal.  Lungs: Somewhat distant but clear to auscultation Heart: Regular rate and rhythm without murmurs Abdomen: Obese, active Extremities: Normal configuration Neurologic: Symmetric, intact.  Studies Reviewed:  PET/CT scan.  No tracheal compromise.     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 Diabetes (250.00) (E11.9). 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  This is a relatively minor procedure we are doing.  I cannot reach your tonsil here in the office, so I will put you  to sleep briefly.  Because you may have sleep apnea, we will observe you overnight in the hospital and I will come back and send you home the following morning.  Hydrocodone liquid should be plenty for pain.  You can advance diet and activity as comfortable.  Recheck here in my office one to 2 weeks postop.  Hydrocodone-Acetaminophen 7.5-325 MG/15ML Oral Solution;10-20 ml po q4-6h prn pain; Qty300; R0; Rx.  Veena Sturgess 11/29/2012, 1:42 PM     

## 2012-11-30 NOTE — Interval H&P Note (Signed)
History and Physical Interval Note:  11/30/2012 7:32 AM  Kaitlin [name]  has presented today for surgery, with the diagnosis of right tonsilar hypertrophy  The various methods of treatment have been discussed with the patient and family. After consideration of risks, benefits and other options for treatment, the patient has consented to  Procedure(s): RIGHT TONSIL BIOPSY WITH FRESH FROZEN ANALYSIS (Right) as a surgical intervention .  The patient's history has been re-reviewed, patient re-examined, no change in status, stable for surgery.  I have re-reviewed the patient's chart and labs.  Questions were answered to the patient's satisfaction.     Flo Shanks

## 2012-11-30 NOTE — Transfer of Care (Signed)
Immediate Anesthesia Transfer of Care Note  Patient: Kaitlin [name]  Procedure(s) Performed: Procedure(s): RIGHT TONSIL BIOPSY WITH FRESH FROZEN ANALYSIS (Right)  Patient Location: PACU  Anesthesia Type:General  Level of Consciousness: sedated, patient cooperative and responds to stimulation  Airway & Oxygen Therapy: Patient Spontanous Breathing and Patient connected to face mask oxygen  Post-op Assessment: Report given to PACU RN, Post -op Vital signs reviewed and stable and Patient moving all extremities  Post vital signs: Reviewed and stable  Complications: No apparent anesthesia complications

## 2012-11-30 NOTE — Anesthesia Preprocedure Evaluation (Addendum)
Anesthesia Evaluation  Patient identified by MRN, date of birth, ID band Patient awake    Reviewed: Allergy & Precautions, H&P , NPO status , Patient's Chart, lab work & pertinent test results  Airway Mallampati: III TM Distance: >3 FB Neck ROM: Full    Dental no notable dental hx. (+) Dental Advisory Given   Pulmonary neg pulmonary ROS,  breath sounds clear to auscultation  Pulmonary exam normal       Cardiovascular hypertension, Pt. on medications negative cardio ROS  Rhythm:Regular Rate:Normal     Neuro/Psych negative neurological ROS  negative psych ROS   GI/Hepatic negative GI ROS, Neg liver ROS,   Endo/Other  diabetes, Type 1, Insulin DependentMorbid obesity  Renal/GU      Musculoskeletal   Abdominal   Peds  Hematology negative hematology ROS (+) Sickle cell trait ,   Anesthesia Other Findings   Reproductive/Obstetrics negative OB ROS                          Anesthesia Physical Anesthesia Plan  ASA: III  Anesthesia Plan: General   Post-op Pain Management:    Induction: Intravenous  Airway Management Planned: Video Laryngoscope Planned  Additional Equipment:   Intra-op Plan:   Post-operative Plan: Extubation in OR  Informed Consent: I have reviewed the patients History and Physical, chart, labs and discussed the procedure including the risks, benefits and alternatives for the proposed anesthesia with the patient or authorized representative who has indicated his/her understanding and acceptance.   Dental advisory given  Plan Discussed with: Anesthesiologist and Surgeon  Anesthesia Plan Comments:         Anesthesia Quick Evaluation

## 2012-11-30 NOTE — Progress Notes (Signed)
I was just called to help RT covering unit to collect ABG that was ordered at 1400.  ABG obtained, no apparent complications, RN aware.

## 2012-11-30 NOTE — Progress Notes (Signed)
MEDICATION RELATED CONSULT NOTE - INITIAL   Pharmacy Consult for insulin Indication: DM  Allergies  Allergen Reactions  . Amoxicillin-Pot Clavulanate Diarrhea    Patient Measurements:   Adjusted Body Weight:   Vital Signs: Temp: 98.7 F (37.1 C) (11/20 1401) Temp src: Oral (11/20 1401) BP: 124/48 mmHg (11/20 1401) Pulse Rate: 98 (11/20 1401) Intake/Output from previous day:   Intake/Output from this shift: Total I/O In: 1100 [I.V.:1100] Out: 600 [Urine:600]  Labs:  Recent Labs  11/29/12 0824 11/30/12 1511  WBC 16.7* 14.0*  HGB 13.3 11.6*  HCT 38.2 34.1*  PLT 282 241  CREATININE 0.88  --    The CrCl is unknown because both a height and weight (above a minimum accepted value) are required for this calculation.   Microbiology: Recent Results (from the past 720 hour(s))  SURGICAL PCR SCREEN     Status: None   Collection Time    11/06/12  9:07 AM      Result Value Range Status   MRSA, PCR NEGATIVE  NEGATIVE Final   Staphylococcus aureus NEGATIVE  NEGATIVE Final   Comment:            The Xpert SA Assay (FDA     approved for NASAL specimens     in patients over 22 years of age),     is one component of     a comprehensive surveillance     program.  Test performance has     been validated by [employer] for patients greater     than or equal to 37 year old.     It is not intended     to diagnose infection nor to     guide or monitor treatment.    Medical History: Past Medical History  Diagnosis Date  . Diabetes mellitus   . High cholesterol   . Leukocytosis   . Anemia   . Obesity   . Sickle cell trait   . Hypertension     takes medicine to protect kidneys, does not have HTN  . Bronchitis   . Pneumonia     Medications:  Prescriptions prior to admission  Medication Sig Dispense Refill  . albuterol (PROVENTIL HFA;VENTOLIN HFA) 108 (90 BASE) MCG/ACT inhaler Inhale 2 puffs into the lungs every 4 (four) hours as needed for wheezing.  1 Inhaler   0  . ferrous fumarate (HEMOCYTE - 106 MG FE) 325 (106 FE) MG TABS tablet Take 1 tablet by mouth daily.      . furosemide (LASIX) 20 MG tablet Take 10 mg by mouth daily.      Marland Kitchen HYDROcodone-homatropine (HYCODAN) 5-1.5 MG/5ML syrup Take 2.5 mLs by mouth every 6 (six) hours as needed for cough.  30 mL  0  . ibuprofen (ADVIL,MOTRIN) 200 MG tablet Take 600 mg by mouth every 8 (eight) hours as needed. For pain.      Marland Kitchen insulin glargine (LANTUS) 100 UNIT/ML injection Inject 30 Units into the skin at bedtime.      . insulin regular (NOVOLIN R,HUMULIN R) 100 units/mL injection Inject 30 Units into the skin 3 (three) times daily before meals.       Marland Kitchen lisinopril (PRINIVIL,ZESTRIL) 10 MG tablet Take 10 mg by mouth daily.      . metFORMIN (GLUCOPHAGE) 500 MG tablet Take 500 mg by mouth 2 (two) times daily with a meal.      . Multiple Vitamin (MULTIVITAMIN WITH MINERALS) TABS Take 1 tablet by  mouth daily.      . Multiple Vitamins-Minerals (IMMUNE SUPPORT VITAMIN C) PACK Take 1 Package by mouth daily.      Marland Kitchen oxyCODONE-acetaminophen (PERCOCET/ROXICET) 5-325 MG per tablet Take 1-2 tablets by mouth every 4 (four) hours as needed for severe pain.      . simvastatin (ZOCOR) 40 MG tablet Take 40 mg by mouth at bedtime.      Marland Kitchen Specialty Vitamins Products (VITAMINS FOR HAIR PO) Take 1 tablet by mouth daily.       Scheduled:  . [START ON 12/01/2012] ferrous fumarate  1 tablet Oral Daily  . furosemide  10 mg Oral Daily  . heparin subcutaneous  5,000 Units Subcutaneous Q8H  . HYDROmorphone      . insulin aspart      . insulin aspart  0-20 Units Subcutaneous TID WC  . insulin glargine  30 Units Subcutaneous QHS  . lisinopril  10 mg Oral Daily  . metFORMIN  500 mg Oral BID WC  . simvastatin  40 mg Oral QHS    Assessment: Pt is s/p tonsil mass biopsy. She has a hx of DM that is on insulin at home. Rx was consulted about the DM management while she is NPO. After talking to her family, it seems like her CBGs are not  that well controlled at home. She is going to be NPO at least for now. Family said that her CBGs always run high despite she gets insulin or not. Since she is on the resistant SSI, I will reduce her lantus to half dose and keep an eye out on her CBGs.    Plan:   Resistant SSI Lantus 15 units SQ qday F/u with CBGs  Ulyses Southward [place] 11/30/2012,3:33 PM

## 2012-11-30 NOTE — Preoperative (Signed)
Beta Blockers   Reason not to administer Beta Blockers:Not Applicable 

## 2012-11-30 NOTE — Anesthesia Procedure Notes (Signed)
Procedure Name: Intubation Date/Time: 11/30/2012 7:41 AM Performed by: Elon Alas Pre-anesthesia Checklist: Patient identified, Timeout performed, Emergency Drugs available, Suction available and Patient being monitored Patient Re-evaluated:Patient Re-evaluated prior to inductionOxygen Delivery Method: Circle system utilized Preoxygenation: Pre-oxygenation with 100% oxygen Intubation Type: IV induction Ventilation: Mask ventilation without difficulty and Oral airway inserted - appropriate to patient size Grade View: Grade I Tube type: Oral Tube size: 7.5 mm Number of attempts: 1 Airway Equipment and Method: Stylet and Video-laryngoscopy Placement Confirmation: positive ETCO2,  ETT inserted through vocal cords under direct vision and breath sounds checked- equal and bilateral Secured at: 22 cm Tube secured with: Tape Dental Injury: Teeth and Oropharynx as per pre-operative assessment  Future Recommendations: Recommend- induction with short-acting agent, and alternative techniques readily available Comments: Grade 1 view with glide, large tonsilar mass on right obstructing view partially, easily moveable

## 2012-12-01 ENCOUNTER — Other Ambulatory Visit: Payer: Self-pay | Admitting: *Deleted

## 2012-12-01 ENCOUNTER — Encounter: Payer: Self-pay | Admitting: Oncology

## 2012-12-01 DIAGNOSIS — D4989 Neoplasm of unspecified behavior of other specified sites: Secondary | ICD-10-CM

## 2012-12-01 DIAGNOSIS — J358 Other chronic diseases of tonsils and adenoids: Secondary | ICD-10-CM | POA: Diagnosis present

## 2012-12-01 LAB — GLUCOSE, CAPILLARY: Glucose-Capillary: 261 mg/dL — ABNORMAL HIGH (ref 70–99)

## 2012-12-01 MED ORDER — HYDROCODONE-ACETAMINOPHEN 7.5-325 MG/15ML PO SOLN: 10.0000 mL | ORAL | Status: DC | PRN

## 2012-12-01 MED ORDER — OXYCODONE HCL 5 MG/5ML PO SOLN: 5.0000 mg | ORAL | Status: DC | PRN

## 2012-12-01 MED ORDER — INSULIN GLARGINE 100 UNIT/ML ~~LOC~~ SOLN
30.0000 [IU] | Freq: Every day | SUBCUTANEOUS | Status: DC
  Filled 2012-12-01: qty 0.3

## 2012-12-01 NOTE — Progress Notes (Signed)
Put [employer] disability form on nurse's desk.

## 2012-12-01 NOTE — Progress Notes (Signed)
MEDICATION RELATED CONSULT NOTE - INITIAL   Pharmacy Consult for insulin Indication: DM  Allergies  Allergen Reactions  . Amoxicillin-Pot Clavulanate Diarrhea    Patient Measurements: Height: 5\' 9"  (175.3 cm) Weight: 340 lb 2.7 oz (154.3 kg) IBW/kg (Calculated) : 66.2 Adjusted Body Weight:   Vital Signs: Temp: 98.7 F (37.1 C) (11/21 0800) Temp src: Oral (11/21 0800) BP: 100/56 mmHg (11/21 0800) Pulse Rate: 102 (11/21 0400) Intake/Output from previous day: 11/20 0701 - 11/21 0700 In: 1300 [I.V.:1300] Out: 600 [Urine:600] Intake/Output from this shift: Total I/O In: 240 [P.O.:240] Out: -   Labs:  Recent Labs  11/29/12 0824 11/30/12 1511  WBC 16.7* 14.0*  HGB 13.3 11.6*  HCT 38.2 34.1*  PLT 282 241  APTT  --  29  CREATININE 0.88 0.95  ALBUMIN  --  3.2*  PROT  --  6.3  AST  --  20  ALT  --  18  ALKPHOS  --  95  BILITOT  --  0.4   Estimated Creatinine Clearance: 119.7 ml/min (by C-G formula based on Cr of 0.95).   Microbiology: Recent Results (from the past 720 hour(s))  SURGICAL PCR SCREEN     Status: None   Collection Time    11/06/12  9:07 AM      Result Value Range Status   MRSA, PCR NEGATIVE  NEGATIVE Final   Staphylococcus aureus NEGATIVE  NEGATIVE Final   Comment:            The Xpert SA Assay (FDA     approved for NASAL specimens     in patients over 31 years of age),     is one component of     a comprehensive surveillance     program.  Test performance has     been validated by [employer] for patients greater     than or equal to 60 year old.     It is not intended     to diagnose infection nor to     guide or monitor treatment.    Medical History: Past Medical History  Diagnosis Date  . Diabetes mellitus   . High cholesterol   . Leukocytosis   . Anemia   . Obesity   . Sickle cell trait   . Hypertension     takes medicine to protect kidneys, does not have HTN  . Bronchitis   . Pneumonia     Medications:   Prescriptions prior to admission  Medication Sig Dispense Refill  . albuterol (PROVENTIL HFA;VENTOLIN HFA) 108 (90 BASE) MCG/ACT inhaler Inhale 2 puffs into the lungs every 4 (four) hours as needed for wheezing.  1 Inhaler  0  . ferrous fumarate (HEMOCYTE - 106 MG FE) 325 (106 FE) MG TABS tablet Take 1 tablet by mouth daily.      . furosemide (LASIX) 20 MG tablet Take 10 mg by mouth daily.      Marland Kitchen HYDROcodone-homatropine (HYCODAN) 5-1.5 MG/5ML syrup Take 2.5 mLs by mouth every 6 (six) hours as needed for cough.  30 mL  0  . ibuprofen (ADVIL,MOTRIN) 200 MG tablet Take 600 mg by mouth every 8 (eight) hours as needed. For pain.      Marland Kitchen insulin glargine (LANTUS) 100 UNIT/ML injection Inject 30 Units into the skin at bedtime.      . insulin regular (NOVOLIN R,HUMULIN R) 100 units/mL injection Inject 30 Units into the skin 3 (three) times daily before meals.       Marland Kitchen  lisinopril (PRINIVIL,ZESTRIL) 10 MG tablet Take 10 mg by mouth daily.      . metFORMIN (GLUCOPHAGE) 500 MG tablet Take 500 mg by mouth 2 (two) times daily with a meal.      . Multiple Vitamin (MULTIVITAMIN WITH MINERALS) TABS Take 1 tablet by mouth daily.      . Multiple Vitamins-Minerals (IMMUNE SUPPORT VITAMIN C) PACK Take 1 Package by mouth daily.      Marland Kitchen oxyCODONE-acetaminophen (PERCOCET/ROXICET) 5-325 MG per tablet Take 1-2 tablets by mouth every 4 (four) hours as needed for severe pain.      . simvastatin (ZOCOR) 40 MG tablet Take 40 mg by mouth at bedtime.      Marland Kitchen Specialty Vitamins Products (VITAMINS FOR HAIR PO) Take 1 tablet by mouth daily.       Scheduled:  . ferrous fumarate  1 tablet Oral Daily  . furosemide  10 mg Oral Daily  . heparin subcutaneous  5,000 Units Subcutaneous Q8H  . insulin aspart  0-20 Units Subcutaneous TID WC  . insulin glargine  15 Units Subcutaneous QHS  . lisinopril  10 mg Oral Daily  . metFORMIN  500 mg Oral BID WC  . pneumococcal 23 valent vaccine  0.5 mL Intramuscular Tomorrow-1000  . simvastatin   40 mg Oral QHS    Assessment: Pt is s/p tonsil mass biopsy. She has a hx of DM that is on insulin at home. Rx was consulted about the DM management while she is NPO. After talking to her family, it seems like her CBGs are not that well controlled at home. Eating today. CBGs have been in the 200s. Will resume her home dose lantus since she is going home today.   Plan:   Resistant SSI Lantus 30 units SQ qday F/u with CBGs  Arlester Marker, Minh Quang 12/01/2012,10:23 AM

## 2012-12-01 NOTE — Discharge Summary (Signed)
12/01/2012 8:58 AM  Kaitlin [name] [id number]  Post-Op Day 1, Discharge summary    Temp:  [98 F (36.7 C)-99.6 F (37.6 C)] 98.7 F (37.1 C) (11/21 0800) Pulse Rate:  [93-121] 102 (11/21 0400) Resp:  [8-31] 17 (11/21 0400) BP: (99-161)/(33-83) 100/56 mmHg (11/21 0800) SpO2:  [89 %-100 %] 98 % (11/21 0400) Weight:  [154.3 kg (340 lb 2.7 oz)] 154.3 kg (340 lb 2.7 oz) (11/20 1401),     Intake/Output Summary (Last 24 hours) at 12/01/12 0858 Last data filed at 12/01/12 0800  Gross per 24 hour  Intake    840 ml  Output    600 ml  Net    240 ml    Results for orders placed during the hospital encounter of 11/30/12 (from the past 24 hour(s))  GLUCOSE, CAPILLARY     Status: Abnormal   Collection Time    11/30/12  9:07 AM      Result Value Range   Glucose-Capillary 245 (*) 70 - 99 mg/dL   Comment 1 Documented in Chart     Comment 2 Notify RN    GLUCOSE, CAPILLARY     Status: Abnormal   Collection Time    11/30/12  1:12 PM      Result Value Range   Glucose-Capillary 213 (*) 70 - 99 mg/dL  TYPE AND SCREEN     Status: None   Collection Time    11/30/12  3:10 PM      Result Value Range   ABO/RH(D) A POS     Antibody Screen NEG     Sample Expiration 12/03/2012    CBC     Status: Abnormal   Collection Time    11/30/12  3:11 PM      Result Value Range   WBC 14.0 (*) 4.0 - 10.5 K/uL   RBC 4.54  3.87 - 5.11 MIL/uL   Hemoglobin 11.6 (*) 12.0 - 15.0 g/dL   HCT 65.7 (*) 84.6 - 96.2 %   MCV 75.1 (*) 78.0 - 100.0 fL   MCH 25.6 (*) 26.0 - 34.0 pg   MCHC 34.0  30.0 - 36.0 g/dL   RDW 95.2 (*) 84.1 - 32.4 %   Platelets 241  150 - 400 K/uL  APTT     Status: None   Collection Time    11/30/12  3:11 PM      Result Value Range   aPTT 29  24 - 37 seconds  COMPREHENSIVE METABOLIC PANEL     Status: Abnormal   Collection Time    11/30/12  3:11 PM      Result Value Range   Sodium 141  135 - 145 mEq/L   Potassium 4.2  3.5 - 5.1 mEq/L   Chloride 103  96 - 112 mEq/L   CO2 30  19 - 32  mEq/L   Glucose, Bld 164 (*) 70 - 99 mg/dL   BUN 15  6 - 23 mg/dL   Creatinine, Ser 4.01  0.50 - 1.10 mg/dL   Calcium 9.6  8.4 - 02.7 mg/dL   Total Protein 6.3  6.0 - 8.3 g/dL   Albumin 3.2 (*) 3.5 - 5.2 g/dL   AST 20  0 - 37 U/L   ALT 18  0 - 35 U/L   Alkaline Phosphatase 95  39 - 117 U/L   Total Bilirubin 0.4  0.3 - 1.2 mg/dL   GFR calc non Af Amer 71 (*) >90 mL/min   GFR calc Af Denyse Dago  83 (*) >90 mL/min  PROTIME-INR     Status: None   Collection Time    11/30/12  3:11 PM      Result Value Range   Prothrombin Time 12.3  11.6 - 15.2 seconds   INR 0.93  0.00 - 1.49  GLUCOSE, CAPILLARY     Status: Abnormal   Collection Time    11/30/12  5:19 PM      Result Value Range   Glucose-Capillary 157 (*) 70 - 99 mg/dL   Comment 1 Notify RN    BLOOD GAS, ARTERIAL     Status: Abnormal   Collection Time    11/30/12  7:05 PM      Result Value Range   FIO2 0.21     pH, Arterial 7.379  7.350 - 7.450   pCO2 arterial 47.6 (*) 35.0 - 45.0 mmHg   pO2, Arterial 59.0 (*) 80.0 - 100.0 mmHg   Bicarbonate 27.4 (*) 20.0 - 24.0 mEq/L   TCO2 28.9  0 - 100 mmol/L   Acid-Base Excess 2.7 (*) 0.0 - 2.0 mmol/L   O2 Saturation 88.7     Patient temperature 98.6     Collection site RIGHT RADIAL     Drawn by [phone]     Sample type ARTERIAL DRAW     Allens test (pass/fail) PASS  PASS  GLUCOSE, CAPILLARY     Status: Abnormal   Collection Time    11/30/12  9:26 PM      Result Value Range   Glucose-Capillary 224 (*) 70 - 99 mg/dL   Comment 1 Notify RN     Comment 2 Documented in Chart    GLUCOSE, CAPILLARY     Status: Abnormal   Collection Time    12/01/12  7:26 AM      Result Value Range   Glucose-Capillary 221 (*) 70 - 99 mg/dL   Comment 1 Notify RN     Comment 2 Documented in Chart      SUBJECTIVE:  Pain much better today.  Breathing well. Eating drinking regular food.  OBJECTIVE:  Color, energy good.  Voice clear.  Breathing well.    IMPRESSION:  Satisfactory check.  DM control  incomplete  PLAN:  Attend to DM.  Advance diet and activity.  Analgesia as needed  Admit:  20 NOV Discharge:  21 NOV Final Diagnosis:  Tonsil mass, DM, OSA, obesity Proc:  RIGHT tonsil biopsy Comp:  None Cond:  Ambulatory.  Eating and drinking.  Pain controlled.  Voiding spontaneously.  No bleeding Recheck:  2 weeks Rx's:  Hydrocodone liquid, oxycodone liquid Instructions written and given  Hosp course:  In OR, under general anesthesia, a right tonsil biopsy was performed.  Frozen section lymphoid, not further specified.  Had mod pain early, which settled.  Blood sugars somewhat elevated.  POD 1 eating regular diet and discharged to home and care of family.    Flo Shanks

## 2012-12-01 NOTE — Progress Notes (Signed)
Patient being discharged home per MD order. All discharge paperwork given and patient voiced understanding of instructions.

## 2012-12-04 ENCOUNTER — Encounter: Payer: Self-pay | Admitting: Oncology

## 2012-12-04 NOTE — Progress Notes (Signed)
Faxed disability paper to [facility] @ [phone].

## 2012-12-05 ENCOUNTER — Emergency Department (HOSPITAL_COMMUNITY): Payer: BC Managed Care – PPO

## 2012-12-05 ENCOUNTER — Emergency Department (HOSPITAL_COMMUNITY)
Admission: EM | Admit: 2012-12-05 | Discharge: 2012-12-05 | Disposition: A | Discharge status: Home / Self Care | Payer: BC Managed Care – PPO | Attending: Emergency Medicine | Admitting: Emergency Medicine

## 2012-12-05 ENCOUNTER — Encounter: Payer: Self-pay | Admitting: Oncology

## 2012-12-05 ENCOUNTER — Encounter (HOSPITAL_COMMUNITY): Payer: Self-pay | Admitting: Otolaryngology

## 2012-12-05 DIAGNOSIS — R209 Unspecified disturbances of skin sensation: Secondary | ICD-10-CM | POA: Insufficient documentation

## 2012-12-05 DIAGNOSIS — E669 Obesity, unspecified: Secondary | ICD-10-CM | POA: Insufficient documentation

## 2012-12-05 DIAGNOSIS — E119 Type 2 diabetes mellitus without complications: Secondary | ICD-10-CM | POA: Insufficient documentation

## 2012-12-05 DIAGNOSIS — I10 Essential (primary) hypertension: Secondary | ICD-10-CM | POA: Insufficient documentation

## 2012-12-05 DIAGNOSIS — Z79899 Other long term (current) drug therapy: Secondary | ICD-10-CM | POA: Insufficient documentation

## 2012-12-05 DIAGNOSIS — Z794 Long term (current) use of insulin: Secondary | ICD-10-CM | POA: Insufficient documentation

## 2012-12-05 DIAGNOSIS — D649 Anemia, unspecified: Secondary | ICD-10-CM | POA: Insufficient documentation

## 2012-12-05 DIAGNOSIS — R51 Headache: Secondary | ICD-10-CM | POA: Insufficient documentation

## 2012-12-05 DIAGNOSIS — R5381 Other malaise: Secondary | ICD-10-CM | POA: Insufficient documentation

## 2012-12-05 DIAGNOSIS — R0602 Shortness of breath: Secondary | ICD-10-CM | POA: Insufficient documentation

## 2012-12-05 DIAGNOSIS — R0789 Other chest pain: Secondary | ICD-10-CM | POA: Insufficient documentation

## 2012-12-05 DIAGNOSIS — E7800 Pure hypercholesterolemia, unspecified: Secondary | ICD-10-CM | POA: Insufficient documentation

## 2012-12-05 DIAGNOSIS — Z8709 Personal history of other diseases of the respiratory system: Secondary | ICD-10-CM | POA: Insufficient documentation

## 2012-12-05 DIAGNOSIS — Z8701 Personal history of pneumonia (recurrent): Secondary | ICD-10-CM | POA: Insufficient documentation

## 2012-12-05 DIAGNOSIS — M542 Cervicalgia: Secondary | ICD-10-CM | POA: Insufficient documentation

## 2012-12-05 DIAGNOSIS — R110 Nausea: Secondary | ICD-10-CM | POA: Insufficient documentation

## 2012-12-05 DIAGNOSIS — M25519 Pain in unspecified shoulder: Secondary | ICD-10-CM | POA: Insufficient documentation

## 2012-12-05 LAB — COMPREHENSIVE METABOLIC PANEL
AST: 21 U/L (ref 0–37)
Albumin: 3.7 g/dL (ref 3.5–5.2)
Calcium: 10.3 mg/dL (ref 8.4–10.5)
Chloride: 101 mEq/L (ref 96–112)
Creatinine, Ser: 0.77 mg/dL (ref 0.50–1.10)
Total Bilirubin: 0.2 mg/dL — ABNORMAL LOW (ref 0.3–1.2)
Total Protein: 6.9 g/dL (ref 6.0–8.3)

## 2012-12-05 LAB — CBC WITH DIFFERENTIAL/PLATELET
Basophils Absolute: 0 10*3/uL (ref 0.0–0.1)
Eosinophils Absolute: 0.2 10*3/uL (ref 0.0–0.7)
HCT: 38 % (ref 36.0–46.0)
Hemoglobin: 13.1 g/dL (ref 12.0–15.0)
Lymphocytes Relative: 62 % — ABNORMAL HIGH (ref 12–46)
MCH: 25.6 pg — ABNORMAL LOW (ref 26.0–34.0)
MCHC: 34.5 g/dL (ref 30.0–36.0)
MCV: 74.4 fL — ABNORMAL LOW (ref 78.0–100.0)
Neutrophils Relative %: 34 % — ABNORMAL LOW (ref 43–77)
Platelets: 272 10*3/uL (ref 150–400)
RBC: 5.11 MIL/uL (ref 3.87–5.11)
RDW: 16.1 % — ABNORMAL HIGH (ref 11.5–15.5)
WBC: 17.9 10*3/uL — ABNORMAL HIGH (ref 4.0–10.5)

## 2012-12-05 LAB — URINALYSIS, ROUTINE W REFLEX MICROSCOPIC
Bilirubin Urine: NEGATIVE
Ketones, ur: NEGATIVE mg/dL
Nitrite: NEGATIVE
Urobilinogen, UA: 0.2 mg/dL (ref 0.0–1.0)
pH: 5.5 (ref 5.0–8.0)

## 2012-12-05 LAB — TROPONIN I: Troponin I: <0.3 ng/mL (ref <0.30)

## 2012-12-05 LAB — GLUCOSE, CAPILLARY: Glucose-Capillary: 142 mg/dL — ABNORMAL HIGH (ref 70–99)

## 2012-12-05 MED ORDER — MORPHINE SULFATE 4 MG/ML IJ SOLN
4.0000 mg | Freq: Once | INTRAMUSCULAR | Status: AC
  Administered 2012-12-05: 4 mg via INTRAVENOUS
  Filled 2012-12-05: qty 1

## 2012-12-05 MED ORDER — METHOCARBAMOL 750 MG PO TABS: 750.0000 mg | ORAL_TABLET | Freq: Two times a day (BID) | ORAL | Status: DC

## 2012-12-05 MED ORDER — DIAZEPAM 5 MG PO TABS
10.0000 mg | ORAL_TABLET | Freq: Once | ORAL | Status: AC
  Administered 2012-12-05: 10 mg via ORAL
  Filled 2012-12-05: qty 2

## 2012-12-05 MED ORDER — OXYCODONE-ACETAMINOPHEN 5-325 MG PO TABS
1.0000 | ORAL_TABLET | Freq: Once | ORAL | Status: AC
  Administered 2012-12-05: 1 via ORAL
  Filled 2012-12-05: qty 1

## 2012-12-05 NOTE — ED Notes (Signed)
PA made aware pt 93% on RA when attempting to ambulate, only able to take a few steps before reporting she was dizzy and was assisted back to bed.

## 2012-12-05 NOTE — ED Notes (Signed)
Pt presents with NAD- Pt states recent throat biopsy rt side last Thursday.  Pt reports rt neck pain with numbness and headache to rt side 2 hours ago. Pain is constant. Generalized weakness.  CBG 128 Denies N/V/D and fever. 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 C/O of SOB with CP. Pain is constant "no matter what I am doing".

## 2012-12-05 NOTE — ED Notes (Signed)
Pt is aware that a urine sample is needed and will notify NT when she's ready.

## 2012-12-05 NOTE — ED Notes (Signed)
Pt sts that the nurses just gave them meds and would prefer to wait 30 more mins

## 2012-12-05 NOTE — ED Notes (Signed)
Pt stated that she does not have to go to the restroom and in the next 15 minutes, NT, Kaitlin [name] will check on her.

## 2012-12-05 NOTE — ED Provider Notes (Signed)
Pt was seen by Lowella Dell, PA-C and supervised by Dierdre Forth, PA-C and Raeford Razor, MD.     Kaitlin [name] is a 47 y.o. female with a hx of IDDM, HTN,  presents to the Emergency Department complaining of gradual, persistent, progressively worsening right arm paresthesias onset 1pm today.  Pt was driving at the onset and was with her husband who can verify her LSW time.  She has associated SOB and chest pain that radiates into her right jaw and ear. Pt reports recent christmas tree moving/decorating this morning and thinks she might have pulled a muscle.  Nothing makes her symptoms better or worse.  Pt denies fever, chills, headache, abd pain, N/V/D, dizziness, syncope, dysuria.       Face to face Exam:   General: Awake  HEENT: Atraumatic  Resp: Normal effort, clear and equal breath sounds Cardiac: RRR, no tachycardia Neck: pain to palpation of the right paraspinal; no nuchal rigidity Abd: Nondistended, soft and nontender Neuro: Speech is clear and goal oriented, follows commands      Major Cranial nerves without deficit, no facial droop      Normal strength in upper and lower extremities bilaterally including dorsiflexion and plantar flexion,  strong and equal grip strength      Sensation normal to light and sharp touch      Moves extremities without ataxia, coordination intact      Normal finger to nose and rapid alternating movements      Neg romberg, no pronator drift      Normal gait and balance Lymph: No adenopathy   Results for orders placed during the hospital encounter of 12/05/12  COMPREHENSIVE METABOLIC PANEL      Result Value Range   Sodium 141  135 - 145 mEq/L   Potassium 3.9  3.5 - 5.1 mEq/L   Chloride 101  96 - 112 mEq/L   CO2 29  19 - 32 mEq/L   Glucose, Bld 139 (*) 70 - 99 mg/dL   BUN 14  6 - 23 mg/dL   Creatinine, Ser 5.40  0.50 - 1.10 mg/dL   Calcium 98.1  8.4 - 19.1 mg/dL   Total Protein 6.9  6.0 - 8.3 g/dL   Albumin 3.7  3.5 - 5.2 g/dL   AST 21   0 - 37 U/L   ALT 24  0 - 35 U/L   Alkaline Phosphatase 101  39 - 117 U/L   Total Bilirubin 0.2 (*) 0.3 - 1.2 mg/dL   GFR calc non Af Amer >90  >90 mL/min   GFR calc Af Amer >90  >90 mL/min  CBC WITH DIFFERENTIAL      Result Value Range   WBC 17.9 (*) 4.0 - 10.5 K/uL   RBC 5.11  3.87 - 5.11 MIL/uL   Hemoglobin 13.1  12.0 - 15.0 g/dL   HCT 47.8  29.5 - 62.1 %   MCV 74.4 (*) 78.0 - 100.0 fL   MCH 25.6 (*) 26.0 - 34.0 pg   MCHC 34.5  30.0 - 36.0 g/dL   RDW 30.8 (*) 65.7 - 84.6 %   Platelets 272  150 - 400 K/uL   Neutrophils Relative % 34 (*) 43 - 77 %   Lymphocytes Relative 62 (*) 12 - 46 %   Monocytes Relative 3  3 - 12 %   Eosinophils Relative 1  0 - 5 %   Basophils Relative 0  0 - 1 %   Neutro Abs 6.1  1.7 - 7.7 K/uL   Lymphs Abs 11.1 (*) 0.7 - 4.0 K/uL   Monocytes Absolute 0.5  0.1 - 1.0 K/uL   Eosinophils Absolute 0.2  0.0 - 0.7 K/uL   Basophils Absolute 0.0  0.0 - 0.1 K/uL   WBC Morphology WHITE COUNT CONFIRMED ON SMEAR    TROPONIN I      Result Value Range   Troponin I <0.30  <0.30 ng/mL  GLUCOSE, CAPILLARY      Result Value Range   Glucose-Capillary 142 (*) 70 - 99 mg/dL   Dg Chest 2 View  14/78/2956   CLINICAL DATA:  Chest pain, arm pain, mediastinal mass, history of biopsy proven thymoma  EXAM: CHEST  2 VIEW  COMPARISON:  11/15/2012, 11/29/2012  FINDINGS: Normal heart size and vascularity. Stable large right mediastinal mass. Slight increased right base atelectasis. No focal pneumonia, collapse or consolidation. No edema, pleural fluid, or pneumothorax. Trachea midline. Monitor leads overlie the chest.  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   On: 12/05/2012 17:57   Dg Chest 2 View  11/29/2012   CLINICAL DATA:  Followup mediastinal mass  EXAM: CHEST  2 VIEW  COMPARISON:  11/15/2012 PET scan, 11/10/2012 chest radiograph  FINDINGS: The heart size and  vascular pattern are normal. No consolidation or effusion. Large right mediastinal mass present and unchanged in appearance when compared to prior study.  IMPRESSION: Large stable mediastinal   Electronically Signed   By: Esperanza Heir M.D.   On: 11/29/2012 13:19   Dg Chest 2 View  11/10/2012   CLINICAL DATA:  Mediastinal mass, shortness of breath  EXAM: CHEST  2 VIEW  COMPARISON:  11/09/2012  FINDINGS: Large right mediastinal mass again evident obscuring the right hilum. Stable heart size. Scattered perihilar and bibasilar atelectasis unchanged. No enlarging effusion or pneumothorax. No significant interval change.  IMPRESSION: Stable bilateral atelectasis pattern.  Large right mediastinal mass, unchanged.   Electronically Signed   By: Ruel Favors M.D.   On: 11/10/2012 07:53   Dg Chest 2 View  11/09/2012   CLINICAL DATA:  removal of chest tube, right side air space disease  EXAM: CHEST  2 VIEW  COMPARISON:  DG CHEST 1V PORT dated 11/08/2012; CT ANGIO CHEST W/CM &/OR WO/CM dated 10/17/2012; DG CHEST 1V PORT dated 11/08/2012; DG CHEST 1V PORT dated 11/07/2012  FINDINGS: Normal cardiac silhouette. Large right perihilar mass is again demonstrated. There is atelectasis within the mid left mid and right lung which is similar prior. There is some improvement in aeration at lung bases compared to prior. Lateral projection demonstrates increased density over the lower thoracic spine.  IMPRESSION: 1. Increased density of the lower thoracic spine seen on lateral projection could represent right lower lobe pneumonia. 2. Stable large right perihilar mass. 3. Improvement in aeration to the left and  right lung base.   Electronically Signed   By: Genevive Bi M.D.   On: 11/09/2012 12:33   Dg Chest 2 View  11/06/2012   CLINICAL DATA:  Mediastinotomy.  EXAM: CHEST  2 VIEW  COMPARISON:  Multiple priors, most recently 10/30/2012.  FINDINGS: Again noted is a large lobulated mass in the anterior mediastinum, similar  to recent prior examinations. Lung volumes are normal. No consolidative airspace disease. No pleural effusions. No pneumothorax. No pulmonary nodule or mass noted. Pulmonary vasculature and the cardiac silhouette are within normal limits.  IMPRESSION: Large anterior mediastinal mass redemonstrated; this appears unchanged. No radiographic evidence of acute cardiopulmonary disease.   Electronically Signed   By: Trudie Reed M.D.   On: 11/06/2012 08:53   Ct Head Wo Contrast  12/05/2012   CLINICAL DATA:  Pain.  EXAM: CT HEAD WITHOUT CONTRAST  TECHNIQUE: Contiguous axial images were obtained from the base of the skull through the vertex without intravenous contrast.  COMPARISON:  None.  FINDINGS: Nonenhanced CT reveals no evidence of mass lesion. No hydrocephalus. No hemorrhage. Orbits, paranasal sinuses, mastoids are clear. No acute bony abnormality.  IMPRESSION: No acute or focal abnormality.   Electronically Signed   By: Maisie Fus  Register   On: 12/05/2012 18:11   Ct Cervical Spine Wo Contrast  12/05/2012   CLINICAL DATA:  Numbness.  Neck pain.  Headache.  EXAM: CT CERVICAL SPINE WITHOUT CONTRAST  TECHNIQUE: Multidetector CT imaging of the cervical spine was performed without intravenous contrast. Multiplanar CT image reconstructions were also generated.  COMPARISON:  None.  FINDINGS: No acute soft tissue or bony abnormality. Diffuse degenerative changes noted of the cervical spine. There is no evidence of fracture or dislocation. Lung apices are clear.  IMPRESSION: Diffuse degenerative change.  No acute abnormality.   Electronically Signed   By: Maisie Fus  Register   On: 12/05/2012 18:09   Nm Pet Image Initial (pi) Skull Base To Thigh  11/15/2012   CLINICAL DATA:  Initial treatment strategy for thymoma.  EXAM: NUCLEAR MEDICINE PET SKULL BASE TO THIGH  FASTING BLOOD GLUCOSE:  Value:  171 mg/dl  TECHNIQUE: 40.9 mCi W-11 FDG was injected intravenously. CT data was obtained and used for attenuation correction  and anatomic localization only. (This was not acquired as a diagnostic CT examination.) Additional exam technical data entered on technologist worksheet.  COMPARISON:  CT chest dated 10/17/2012  FINDINGS: Note: The study is limited by heterogeneous FDG uptake due to a combination of body habitus, hyperglycemia, and streak artifact from the patient's arms.  NECK  Hypermetabolism in the posterior nasopharynx, symmetric, likely physiologic.  2.8 x 2.1 cm soft tissue lesion in the right oropharynx, adjacent to the right pontine tonsil (series 2/ image 30), max SUV 11.3.  No hypermetabolic lymph nodes in the neck. Scattered small bilateral cervical lymph nodes which do not meet pathologic CT size criteria.  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  11 mm ground-glass nodule in the right upper lobe (series 2/ image 80), new, favored to be infectious/inflammatory. Additional linear scarring in the anterior right upper lobe. Small right pleural effusion, new.  No hypermetabolic mediastinal or hilar nodes.  ABDOMEN/PELVIS  Heterogeneous hypermetabolism within the liver and spleen, without focal lesion.  No abnormal hypermetabolic activity within the pancreas and adrenal glands.  Heterogeneous GI uptake.  No hypermetabolic lymph nodes in the abdomen or pelvis.  SKELETON  Heterogeneous osseous uptake without suspicious focal lesion to suggest osseous metastasis.  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  11 mm ground-glass nodule in the right upper lobe, new, favored to be infectious/inflammatory. Small right pleural effusion, new.  No findings specific for metastatic disease.  2.8 cm soft tissue lesion in the right oropharynx, max SUV 11.3. ENT consultation with direct visualization is suggested to exclude a head/neck primary lesion.   Electronically Signed   By:  Charline Bills M.D.   On: 11/15/2012 09:43   Dg  Chest Port 1 View  11/08/2012   CLINICAL DATA:  Status post chest tube removal  EXAM: PORTABLE CHEST - 1 VIEW  COMPARISON:  11/08/2012  FINDINGS: There are stress set the heart size appears enlarged. The lung volumes are low. There has been removal of the right chest tube. No significant pneumothorax identified. Atelectasis is identified in the right base.  IMPRESSION: 1. Status post right chest tube removal. No acute findings identified.  2. Low lung volumes and right base atelectasis.  3. Cardiac enlargement   Electronically Signed   By: Signa Kell M.D.   On: 11/08/2012 11:03   Dg Chest Port 1 View  11/08/2012   CLINICAL DATA:  Evaluate chest tube  EXAM: PORTABLE CHEST - 1 VIEW  COMPARISON:  Portable chest x-ray of 11/07/2012  FINDINGS: The lungs remain poorly aerated with basilar volume loss. A right chest tube remains and no definite pneumothorax is seen. Cardiomegaly is stable.  IMPRESSION: Little change in poor aeration, right chest tube, and volume loss at the bases with cardiomegaly.   Electronically Signed   By: Dwyane Dee M.D.   On: 11/08/2012 08:07   Dg Chest Port 1 View  11/07/2012   CLINICAL DATA:  Chest tube  EXAM: PORTABLE CHEST - 1 VIEW  COMPARISON:  11/06/2012  FINDINGS: Stable right chest tube. No pneumothorax. Very low lung volumes. Right paratracheal mediastinal mass stable.  IMPRESSION: Stable. No pneumothorax. Right chest tube remains in place.   Electronically Signed   By: Maryclare Bean M.D.   On: 11/07/2012 07:52   Dg Chest Port 1 View  11/06/2012   CLINICAL DATA:  Mediastinoscopy for mediastinal mass biopsy  EXAM: PORTABLE CHEST - 1 VIEW  COMPARISON:  11/06/2012  FINDINGS: Right chest tube has been inserted. No pneumothorax or large effusion. Low lung volumes accentuating the cardiac and mediastinal contours. Right mediastinal mass remains evident. Increased perihilar and bibasilar atelectasis. Trachea is midline.   IMPRESSION: Low volume chest exam with scattered atelectasis  Right chest tube has been inserted  No significant pneumothorax following mediastinal mass biopsy   Electronically Signed   By: Ruel Favors M.D.   On: 11/06/2012 12:28      Pt with atypical, reproducible chest pain and right sided neck pain.  C/o presthesias of the right arm, also reproducible with right paraspinal palpation.  Pt c/o subjective right sided weakness, will obtain head/neck CT, trop, ecg and labs.  Will also provide pain control.    7:22 PM Pt with negative head/neck CT, CXR with known, stable thymoma, leukocytosis at baseline, ECG nonischemic and initial trop negative.   UA and delta trop pending.   9:06 PM Pt with negative delta trop and negative urine.  Pt reproducible pain with movement and palpation.  Pt is not PERC negative due to minor throat biopsy last week, but no other risk factors for PE.  Pt not tachycardic or hypoxic on my exam.   Patient is to be discharged with recommendation to follow up with PCP in regards to today's hospital visit. Chest pain is not likely of cardiac or pulmonary etiology d/t presentation, perc negative, VSS, no tracheal deviation, no JVD or new murmur, RRR, breath sounds equal bilaterally, EKG without acute abnormalities, negative troponin, and negative CXR. Pt has been advised to return to the ED is CP becomes exertional, associated with diaphoresis or nausea, radiates to left jaw/arm, worsens or becomes concerning in any way. Pt appears reliable for follow up and is agreeable to discharge.  Case has been discussed with Dr. Juleen China who agrees with the above plan to discharge.   Dahlia Client Edel Rivero, PA-C 12/05/12 2112

## 2012-12-05 NOTE — Progress Notes (Signed)
11/30/12-Faxed clinical information along with return to work date to [employer] @ [place] [phone].

## 2012-12-05 NOTE — ED Provider Notes (Signed)
CSN: [id number]     Arrival date & time 12/05/12  1625 History   First MD Initiated Contact with Patient 12/05/12 1630     Chief Complaint  Patient presents with  . Numbness     rt side x 2 hours  . Neck Pain    rt side x 2 hours  . Weakness    generalized x 2 hours  . Headache    rt side x 2 hours   (Consider location/radiation/quality/duration/timing/severity/associated sxs/prior Treatment) Patient is a 46 y.o. female presenting with neck pain, weakness, and headaches. The history is provided by the patient.  Neck Pain Associated symptoms: chest pain, headaches, numbness and weakness   Associated symptoms: no fever   Weakness Associated symptoms include chest pain, headaches, nausea, neck pain, numbness and weakness. Pertinent negatives include no chills, fever or vomiting.  Headache Associated symptoms: nausea, neck pain and numbness   Associated symptoms: no fever and no vomiting    47 yo female presents with sudden onset of right neck/shoulder pain described as achy that radiates down right arm is associated with numbness. Symptoms started somewhere between 1230 and 1330. Patient denies hx of trauma or fall. States that she was decorating her christmas tree this morning prior to onset of pain. PMH significant for type 2 diabetes, and thymoma. Family hx positive for "heart disease" on both sides of family. Patient admits to chest pain and shortness of breath while driving. Admits to nausea, ear pain, and weakness. Denies palpitations and vomiting. Laying down and tylenol does not relieve symptoms. Denies anything making symptoms worse or better.   Past Medical History  Diagnosis Date  . Diabetes mellitus   . High cholesterol   . Leukocytosis   . Anemia   . Obesity   . Sickle cell trait   . Hypertension     takes medicine to protect kidneys, does not have HTN  . Bronchitis   . Pneumonia    Past Surgical History  Procedure Laterality Date  . Tonsillectomy    . Tubal  ligation    . Mediastinotomy chamberlain mcneil Right 11/06/2012    Procedure: MEDIASTINOTOMY CHAMBERLAIN MCNEILPROCEDURE;  Surgeon: Alleen Borne, MD;  Location: [facility] OR;  Service: Thoracic;  Laterality: Right;  . Lymph node biopsy Right 11/30/2012    Procedure: RIGHT TONSIL BIOPSY WITH FRESH FROZEN ANALYSIS;  Surgeon: Flo Shanks, MD;  Location: [facility] OR;  Service: ENT;  Laterality: Right;   Family History  Problem Relation Age of Onset  . Heart disease Father   . Diabetes type II      Family HX   History  Substance Use Topics  . Smoking status: Never Smoker   . Smokeless tobacco: Never Used  . Alcohol Use: No   OB History   Grav Para Term Preterm Abortions TAB SAB Ect Mult Living                 Review of Systems  Constitutional: Negative for fever and chills.  Respiratory: Positive for shortness of breath.   Cardiovascular: Positive for chest pain. Negative for palpitations.  Gastrointestinal: Positive for nausea. Negative for vomiting.  Musculoskeletal: Positive for neck pain. Negative for gait problem.  Neurological: Positive for weakness, numbness and headaches.    Allergies  Amoxicillin-pot clavulanate  Home Medications   Current Outpatient Rx  Name  Route  Sig  Dispense  Refill  . acetaminophen (TYLENOL) 500 MG tablet   Oral   Take 1,000 mg by mouth every 4 (  four) hours as needed for moderate pain.         Marland Kitchen albuterol (PROVENTIL HFA;VENTOLIN HFA) 108 (90 BASE) MCG/ACT inhaler   Inhalation   Inhale 2 puffs into the lungs every 4 (four) hours as needed for wheezing.   1 Inhaler   0   . ferrous fumarate (HEMOCYTE - 106 MG FE) 325 (106 FE) MG TABS tablet   Oral   Take 1 tablet by mouth every morning.          . furosemide (LASIX) 20 MG tablet   Oral   Take 10 mg by mouth every morning.          . insulin glargine (LANTUS) 100 UNIT/ML injection   Subcutaneous   Inject 30 Units into the skin at bedtime.         . insulin regular (NOVOLIN  R,HUMULIN R) 100 units/mL injection   Subcutaneous   Inject 30 Units into the skin 3 (three) times daily before meals.          Marland Kitchen lisinopril (PRINIVIL,ZESTRIL) 10 MG tablet   Oral   Take 10 mg by mouth every morning.          . metFORMIN (GLUCOPHAGE) 500 MG tablet   Oral   Take 500 mg by mouth 2 (two) times daily with a meal.         . Multiple Vitamin (MULTIVITAMIN WITH MINERALS) TABS   Oral   Take 1 tablet by mouth every morning.          Marland Kitchen oxyCODONE-acetaminophen (PERCOCET/ROXICET) 5-325 MG per tablet   Oral   Take 1-2 tablets by mouth every 4 (four) hours as needed for severe pain.         . simvastatin (ZOCOR) 40 MG tablet   Oral   Take 40 mg by mouth at bedtime.         Marland Kitchen Specialty Vitamins Products (VITAMINS FOR HAIR PO)   Oral   Take 1 tablet by mouth every morning.          . vitamin C (ASCORBIC ACID) 500 MG tablet   Oral   Take 500 mg by mouth every morning.         Marland Kitchen HYDROcodone-acetaminophen (HYCET) 7.5-325 mg/15 ml solution   Oral   Take 10-20 mLs by mouth every 4 (four) hours as needed for moderate pain.   120 mL   0   . methocarbamol (ROBAXIN) 750 MG tablet   Oral   Take 1 tablet (750 mg total) by mouth 2 (two) times daily.   10 tablet   0   . oxyCODONE (ROXICODONE) 5 MG/5ML solution   Oral   Take 5-10 mLs (5-10 mg total) by mouth every 4 (four) hours as needed for severe pain.   200 mL   0    BP 145/92  Pulse 90  Temp(Src) 97.9 F (36.6 C)  Resp 19  SpO2 97%  LMP 06/17/2012 Physical Exam  Constitutional: She is oriented to person, place, and time. She appears well-developed and well-nourished. She does not appear ill. No distress.  HENT:  Head: Normocephalic and atraumatic.  Right Ear: Tympanic membrane and ear canal normal. Tympanic membrane is not injected.  Left Ear: Tympanic membrane and ear canal normal. Tympanic membrane is not injected.  Nose: Nose normal.  Mouth/Throat: Uvula is midline and oropharynx is clear  and moist. No oropharyngeal exudate.  Eyes: EOM are normal. Pupils are equal, round, and reactive to light. No scleral  icterus.  Cardiovascular: Normal rate and regular rhythm.  Exam reveals no gallop and no friction rub.   No murmur heard. Pulmonary/Chest: Effort normal and breath sounds normal. No respiratory distress. She has no wheezes. She has no rales.  Musculoskeletal:  Patient seems to show no real signs of symptom relief when asked to raise right hand above her head.   Neurological: She is alert and oriented to person, place, and time. She has normal strength. A sensory deficit (Right arm feels numb when compared to left arm. ) is present. No cranial nerve deficit.  CN II-VII seem to be grossly intact.   Skin: Skin is warm and dry. She is not diaphoretic.  Psychiatric: She has a normal mood and affect. Her behavior is normal. Her speech is not tangential and not slurred. Cognition and memory are normal.    ED Course  Procedures (including critical care time) Labs Review Labs Reviewed  COMPREHENSIVE METABOLIC PANEL - Abnormal; Notable for the following:    Glucose, Bld 139 (*)    Total Bilirubin 0.2 (*)    All other components within normal limits  CBC WITH DIFFERENTIAL - Abnormal; Notable for the following:    WBC 17.9 (*)    MCV 74.4 (*)    MCH 25.6 (*)    RDW 16.1 (*)    Neutrophils Relative % 34 (*)    Lymphocytes Relative 62 (*)    Lymphs Abs 11.1 (*)    All other components within normal limits  GLUCOSE, CAPILLARY - Abnormal; Notable for the following:    Glucose-Capillary 142 (*)    All other components within normal limits  TROPONIN I  URINALYSIS, ROUTINE W REFLEX MICROSCOPIC  TROPONIN I   Imaging Review Dg Chest 2 View  12/05/2012   CLINICAL DATA:  Chest pain, arm pain, mediastinal mass, history of biopsy proven thymoma  EXAM: CHEST  2 VIEW  COMPARISON:  11/15/2012, 11/29/2012  FINDINGS: Normal heart size and vascularity. Stable large right mediastinal mass.  Slight increased right base atelectasis. No focal pneumonia, collapse or consolidation. No edema, pleural fluid, or pneumothorax. Trachea midline. Monitor leads overlie the chest.  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   On: 12/05/2012 17:57   Ct Head Wo Contrast  12/05/2012   CLINICAL DATA:  Pain.  EXAM: CT HEAD WITHOUT CONTRAST  TECHNIQUE: Contiguous axial images were obtained from the base of the skull through the vertex without intravenous contrast.  COMPARISON:  None.  FINDINGS: Nonenhanced CT reveals no evidence of mass lesion. No hydrocephalus. No hemorrhage. Orbits, paranasal sinuses, mastoids are clear. No acute bony abnormality.  IMPRESSION: No acute or focal abnormality.   Electronically Signed   By: Maisie Fus  Register   On: 12/05/2012 18:11   Ct Cervical Spine Wo Contrast  12/05/2012   CLINICAL DATA:  Numbness.  Neck pain.  Headache.  EXAM: CT CERVICAL SPINE WITHOUT CONTRAST  TECHNIQUE: Multidetector CT imaging of the cervical spine was performed without intravenous contrast. Multiplanar CT image reconstructions were also generated.  COMPARISON:  None.  FINDINGS: No acute soft tissue or bony abnormality. Diffuse degenerative changes noted of the cervical spine. There is no evidence of fracture or dislocation. Lung apices are clear.  IMPRESSION: Diffuse degenerative change.  No acute abnormality.   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 Atypical chest pain    Patient presents with right arm/shoulder pain/weakness/numbness, right sided chest pain and DOE. Symptoms started around 12:30 per the patient, confirmed by Husband.  Patient does not meet PERC criteria because of tachycardic on presentation of 106 bpm and recent surgery for Biopsy of throat. 4 additional HR measurements where WNL. Chest pain reproducible on exam. Lungs clear to auscultation. HR normal. CT head/neck and CXR negative for acute findings. Troponin negative x 2. EKG unchanged since last tracing. CBC shows leukocytosis, normal compared to past labs.  No pneumonia present on CXR.  U/A negative. Doubt PE, MI, Stroke. No signs of infectious etiology. Patient scheduled for surgery to remove tumor from chest on Friday. Suspect DOE secondary to tumor. Suspect neck/chest pain related to muscle strain from decorating christmas tree today. Patient discharged home with husband and discussed need for follow up. Discussed tx with muscle relaxer and NSAIDs. Patient agrees with plan.   Rudene Anda, PA-C 12/06/12 [phone]

## 2012-12-05 NOTE — ED Notes (Signed)
MD at bedside. 

## 2012-12-08 NOTE — ED Provider Notes (Signed)
Medical screening examination/treatment/procedure(s) were performed by non-physician practitioner and as supervising physician I was immediately available for consultation/collaboration.  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

## 2012-12-08 NOTE — ED Provider Notes (Signed)
Medical screening examination/treatment/procedure(s) were performed by non-physician practitioner and as supervising physician I was immediately available for consultation/collaboration.  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

## 2012-12-11 ENCOUNTER — Ambulatory Visit (HOSPITAL_COMMUNITY): Payer: BC Managed Care – PPO

## 2012-12-11 ENCOUNTER — Inpatient Hospital Stay (HOSPITAL_COMMUNITY)
Admission: EM | Admit: 2012-12-11 | Discharge: 2012-12-14 | DRG: 194 | Disposition: A | Discharge status: Home / Self Care | Payer: BC Managed Care – PPO | Attending: Internal Medicine | Admitting: Internal Medicine

## 2012-12-11 ENCOUNTER — Emergency Department (HOSPITAL_COMMUNITY): Payer: BC Managed Care – PPO

## 2012-12-11 ENCOUNTER — Encounter (HOSPITAL_COMMUNITY): Payer: Self-pay | Admitting: Emergency Medicine

## 2012-12-11 DIAGNOSIS — D150 Benign neoplasm of thymus: Secondary | ICD-10-CM | POA: Diagnosis present

## 2012-12-11 DIAGNOSIS — D649 Anemia, unspecified: Secondary | ICD-10-CM

## 2012-12-11 DIAGNOSIS — D4989 Neoplasm of unspecified behavior of other specified sites: Secondary | ICD-10-CM | POA: Diagnosis present

## 2012-12-11 DIAGNOSIS — Z794 Long term (current) use of insulin: Secondary | ICD-10-CM

## 2012-12-11 DIAGNOSIS — E7800 Pure hypercholesterolemia, unspecified: Secondary | ICD-10-CM

## 2012-12-11 DIAGNOSIS — Z881 Allergy status to other antibiotic agents status: Secondary | ICD-10-CM

## 2012-12-11 DIAGNOSIS — D573 Sickle-cell trait: Secondary | ICD-10-CM | POA: Diagnosis present

## 2012-12-11 DIAGNOSIS — J358 Other chronic diseases of tonsils and adenoids: Secondary | ICD-10-CM

## 2012-12-11 DIAGNOSIS — J90 Pleural effusion, not elsewhere classified: Secondary | ICD-10-CM | POA: Diagnosis present

## 2012-12-11 DIAGNOSIS — E119 Type 2 diabetes mellitus without complications: Secondary | ICD-10-CM | POA: Diagnosis present

## 2012-12-11 DIAGNOSIS — D72829 Elevated white blood cell count, unspecified: Secondary | ICD-10-CM | POA: Diagnosis present

## 2012-12-11 DIAGNOSIS — J189 Pneumonia, unspecified organism: Principal | ICD-10-CM | POA: Diagnosis present

## 2012-12-11 DIAGNOSIS — Z79899 Other long term (current) drug therapy: Secondary | ICD-10-CM

## 2012-12-11 DIAGNOSIS — I10 Essential (primary) hypertension: Secondary | ICD-10-CM | POA: Diagnosis present

## 2012-12-11 DIAGNOSIS — E785 Hyperlipidemia, unspecified: Secondary | ICD-10-CM | POA: Diagnosis present

## 2012-12-11 DIAGNOSIS — Z6841 Body Mass Index (BMI) 40.0 and over, adult: Secondary | ICD-10-CM

## 2012-12-11 LAB — CBC
Hemoglobin: 11.2 g/dL — ABNORMAL LOW (ref 12.0–15.0)
MCH: 26.1 pg (ref 26.0–34.0)
MCV: 74.4 fL — ABNORMAL LOW (ref 78.0–100.0)
Platelets: 406 10*3/uL — ABNORMAL HIGH (ref 150–400)
RBC: 4.29 MIL/uL (ref 3.87–5.11)
RDW: 16.4 % — ABNORMAL HIGH (ref 11.5–15.5)

## 2012-12-11 LAB — POCT I-STAT, CHEM 8
Chloride: 107 mEq/L (ref 96–112)
Creatinine, Ser: 1 mg/dL (ref 0.50–1.10)
Glucose, Bld: 119 mg/dL — ABNORMAL HIGH (ref 70–99)
Hemoglobin: 11.2 g/dL — ABNORMAL LOW (ref 12.0–15.0)
Potassium: 3.9 mEq/L (ref 3.5–5.1)

## 2012-12-11 MED ORDER — IOHEXOL 350 MG/ML SOLN
80.0000 mL | Freq: Once | INTRAVENOUS | Status: AC | PRN
  Administered 2012-12-11: 80 mL via INTRAVENOUS

## 2012-12-11 MED ORDER — SODIUM CHLORIDE 0.9 % IV BOLUS (SEPSIS)
500.0000 mL | Freq: Once | INTRAVENOUS | Status: AC
  Administered 2012-12-11: 500 mL via INTRAVENOUS

## 2012-12-11 MED ORDER — PIPERACILLIN-TAZOBACTAM 3.375 G IVPB
3.3750 g | Freq: Once | INTRAVENOUS | Status: AC
  Administered 2012-12-12: 3.375 g via INTRAVENOUS
  Filled 2012-12-11: qty 50

## 2012-12-11 MED ORDER — VANCOMYCIN HCL IN DEXTROSE 1-5 GM/200ML-% IV SOLN
1000.0000 mg | Freq: Once | INTRAVENOUS | Status: AC
  Administered 2012-12-12: 1000 mg via INTRAVENOUS
  Filled 2012-12-11: qty 200

## 2012-12-11 NOTE — ED Notes (Signed)
Pt sent here by Dr. Lazarus Salines to have a CXR to r/o possible bronchitis, pneumonia, and/or blood clots. Pt reports SOB x6 days and seen at [facility] on Tuesday and d/c home. Pt is scheduled to have a nodule removed from her Right lung, pt unsure of location of nodule. Dr. Laneta Simmers is requesting to have f/u as well due to schedule procedure

## 2012-12-11 NOTE — ED Provider Notes (Signed)
CSN: [id number]     Arrival date & time 12/11/12  1614 History   First MD Initiated Contact with Patient 12/11/12 2043     Chief Complaint  Patient presents with  . Shortness of Breath  . Cough   (Consider location/radiation/quality/duration/timing/severity/associated sxs/prior Treatment) HPI Comments: Patient was diagnosed with a Thymoma 1 mont ago is scheduled for surgery on Dec 05/2012 was seem by Dr. Lazarus Salines for increasing SOB, cough and intermittent low grade fever for the past 5 days  Has been using an inhaler without relief of her symptoms   Patient is a 47 y.o. female presenting with shortness of breath and cough. The history is provided by the patient.  Shortness of Breath Severity:  Moderate Onset quality:  Gradual Timing:  Intermittent Progression:  Worsening Chronicity:  New Context: activity   Relieved by:  Nothing Worsened by:  Activity Ineffective treatments:  Inhaler Associated symptoms: cough, fever and wheezing   Associated symptoms: no sore throat   Cough:    Cough characteristics:  Non-productive   Severity:  Moderate   Onset quality:  Gradual   Timing:  Intermittent   Progression:  Worsening Fever:    Duration:  5 days   Timing:  Intermittent   Max temp PTA (F):  101.5   Temp source:  Oral   Progression:  Unchanged Wheezing:    Severity:  Mild   Onset quality:  Gradual   Duration:  1 month   Timing:  Intermittent   Progression:  Worsening   Chronicity:  New Cough Associated symptoms: fever, shortness of breath and wheezing   Associated symptoms: no sore throat     Past Medical History  Diagnosis Date  . Diabetes mellitus   . High cholesterol   . Leukocytosis   . Anemia   . Obesity   . Sickle cell trait   . Hypertension     takes medicine to protect kidneys, does not have HTN  . Bronchitis   . Pneumonia    Past Surgical History  Procedure Laterality Date  . Tonsillectomy    . Tubal ligation    . Mediastinotomy chamberlain mcneil  Right 11/06/2012    Procedure: MEDIASTINOTOMY CHAMBERLAIN MCNEILPROCEDURE;  Surgeon: Alleen Borne, MD;  Location: [facility] OR;  Service: Thoracic;  Laterality: Right;  . Lymph node biopsy Right 11/30/2012    Procedure: RIGHT TONSIL BIOPSY WITH FRESH FROZEN ANALYSIS;  Surgeon: Flo Shanks, MD;  Location: [facility] OR;  Service: ENT;  Laterality: Right;   Family History  Problem Relation Age of Onset  . Heart disease Father   . Diabetes type II      Family HX   History  Substance Use Topics  . Smoking status: Never Smoker   . Smokeless tobacco: Never Used  . Alcohol Use: No   OB History   Grav Para Term Preterm Abortions TAB SAB Ect Mult Living                 Review of Systems  Constitutional: Positive for fever.  HENT: Negative for sore throat.   Respiratory: Positive for cough, chest tightness, shortness of breath and wheezing.   All other systems reviewed and are negative.    Allergies  Amoxicillin-pot clavulanate  Home Medications   Current Outpatient Rx  Name  Route  Sig  Dispense  Refill  . acetaminophen (TYLENOL) 500 MG tablet   Oral   Take 1,000 mg by mouth every 4 (four) hours as needed for moderate pain.         Marland Kitchen  albuterol (PROVENTIL HFA;VENTOLIN HFA) 108 (90 BASE) MCG/ACT inhaler   Inhalation   Inhale 2 puffs into the lungs every 4 (four) hours as needed for wheezing.   1 Inhaler   0   . ferrous fumarate (HEMOCYTE - 106 MG FE) 325 (106 FE) MG TABS tablet   Oral   Take 1 tablet by mouth every morning.          . furosemide (LASIX) 20 MG tablet   Oral   Take 10 mg by mouth every morning.          Marland Kitchen ibuprofen (ADVIL,MOTRIN) 800 MG tablet   Oral   Take 800 mg by mouth every 8 (eight) hours as needed for moderate pain.         Marland Kitchen insulin glargine (LANTUS) 100 UNIT/ML injection   Subcutaneous   Inject 30 Units into the skin at bedtime.         . insulin regular (NOVOLIN R,HUMULIN R) 100 units/mL injection   Subcutaneous   Inject 30 Units into the  skin 3 (three) times daily before meals.          Marland Kitchen lisinopril (PRINIVIL,ZESTRIL) 10 MG tablet   Oral   Take 10 mg by mouth every morning.          . metFORMIN (GLUCOPHAGE) 500 MG tablet   Oral   Take 500 mg by mouth 2 (two) times daily with a meal.         . methocarbamol (ROBAXIN) 750 MG tablet   Oral   Take 1 tablet (750 mg total) by mouth 2 (two) times daily.   10 tablet   0   . Multiple Vitamin (MULTIVITAMIN WITH MINERALS) TABS   Oral   Take 1 tablet by mouth every morning.          . simvastatin (ZOCOR) 40 MG tablet   Oral   Take 40 mg by mouth at bedtime.         Marland Kitchen Specialty Vitamins Products (VITAMINS FOR HAIR PO)   Oral   Take 1 tablet by mouth every morning.          . vitamin C (ASCORBIC ACID) 500 MG tablet   Oral   Take 1,000-2,000 mg by mouth once as needed (for cold).           BP 146/43  Pulse 108  Temp(Src) 99.2 F (37.3 C) (Oral)  Resp 26  Wt 349 lb 8 oz (158.532 kg)  SpO2 98%  LMP 06/17/2012 Physical Exam  Nursing note and vitals reviewed. Constitutional: She is oriented to person, place, and time. She appears well-developed and well-nourished.  Morbid obesity   HENT:  Head: Normocephalic.  Mouth/Throat: Oropharynx is clear and moist.  Eyes: Pupils are equal, round, and reactive to light.  Neck: Normal range of motion.  Cardiovascular: Regular rhythm.  Tachycardia present.   Pulmonary/Chest: Effort normal and breath sounds normal. No respiratory distress. She has no wheezes.  Neurological: She is alert and oriented to person, place, and time.  Skin: Skin is warm. No erythema.    ED Course  Procedures (including critical care time) Labs Review Labs Reviewed  CBC - Abnormal; Notable for the following:    WBC 20.2 (*)    Hemoglobin 11.2 (*)    HCT 31.9 (*)    MCV 74.4 (*)    RDW 16.4 (*)    Platelets 406 (*)    All other components within normal limits  POCT I-STAT, CHEM 8 -  Abnormal; Notable for the following:     Glucose, Bld 119 (*)    Hemoglobin 11.2 (*)    HCT 33.0 (*)    All other components within normal limits   Imaging Review Dg Chest 2 View  12/11/2012   CLINICAL DATA:  Short of breath fever. 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  EXAM: CHEST  2 VIEW  COMPARISON:  Chest radiograph 12/05/2012, PET-CT 11/15/2012  FINDINGS: Mildly enlarged cardiac silhouette. Large right paramediastinal mass not changed. There is a small right effusion which is also similar. No focal consolidation. No pneumothorax.  IMPRESSION: 1. Stable large right mediastinal mass consistent with of thymoma 2. Stable small right effusion   Electronically Signed   By: Genevive Bi M.D.   On: 12/11/2012 18:30   Ct Angio Chest Pe W/cm &/or Wo Cm  12/11/2012   CLINICAL DATA:  Possible bronchitis  EXAM: CT ANGIOGRAPHY CHEST WITH CONTRAST  TECHNIQUE: Multidetector CT imaging of the chest was performed using the standard protocol during bolus administration of intravenous contrast. Multiplanar CT image reconstructions including MIPs were obtained to evaluate the vascular anatomy.  CONTRAST:  80mL OMNIPAQUE IOHEXOL 350 MG/ML SOLN  COMPARISON:  12/11/2012 chest radiograph, 11/15/2012 PET-CT  FINDINGS: Suboptimal contrast bolus timing. No central pulmonary arterial filling defect. Mixing of contrast and non-opacified blood within the lobar and more peripheral branches limits evaluation.  Cardiomegaly. Trace pericardial fluid. Moderate right pleural effusion.  Continued enlargement of the right paramediastinal/anterior mediastinal mass, with margins inseparable from the right mediastinum and right upper lung. At the level of the previous measurement more superiorly, the mass measures 6.4 x 10.9 cm as compared to 6.5 x 10.2 cm however slightly inferiorly, the mass measures 8.4 x 9.0 cm as compared to 6.3 x 6.0 cm on the prior. Prominent mediastinal and right hilar lymph nodes. As index, subcarinal measuring 1.4 cm short axis.   Limited upper abdominal images show nothing acute.  Central airways are patent. No pneumothorax. Mild linear right lung opacities. Respiratory motion degrades detailed lung parenchymal evaluation.  Mild multilevel degenerative change.  No acute osseous finding.  Review of the MIP images confirms the above findings.  IMPRESSION: Suboptimal examination to evaluate for pulmonary embolism due to motion and bolus timing. Within this limitation; no large central filling defect. The lobar and more peripheral branches are not well evaluated and pulmonary embolism is not excluded.  Interval enlargement of the anterior mediastinal mass.  Moderate right pleural effusion with associated airspace opacity; atelectasis versus pneumonia, increased in the interval.   Electronically Signed   By: Jearld Lesch M.D.   On: 12/11/2012 23:32    EKG Interpretation   None       MDM   1. Healthcare-associated pneumonia    Spoke with Dr. Jenne Pane ENT and DR. Hendrix, cardiothorasic to inform both of patient admission     Arman Filter, NP 12/12/12 [phone]

## 2012-12-11 NOTE — ED Notes (Signed)
PA at bedside.

## 2012-12-12 ENCOUNTER — Encounter (HOSPITAL_COMMUNITY): Payer: Self-pay | Admitting: Internal Medicine

## 2012-12-12 ENCOUNTER — Inpatient Hospital Stay (HOSPITAL_COMMUNITY): Payer: BC Managed Care – PPO

## 2012-12-12 DIAGNOSIS — J189 Pneumonia, unspecified organism: Secondary | ICD-10-CM

## 2012-12-12 DIAGNOSIS — D150 Benign neoplasm of thymus: Secondary | ICD-10-CM

## 2012-12-12 DIAGNOSIS — E785 Hyperlipidemia, unspecified: Secondary | ICD-10-CM | POA: Diagnosis present

## 2012-12-12 DIAGNOSIS — I10 Essential (primary) hypertension: Secondary | ICD-10-CM

## 2012-12-12 LAB — CBC WITH DIFFERENTIAL/PLATELET
Basophils Absolute: 0 10*3/uL (ref 0.0–0.1)
Lymphocytes Relative: 47 % — ABNORMAL HIGH (ref 12–46)
Lymphs Abs: 8.7 10*3/uL — ABNORMAL HIGH (ref 0.7–4.0)
MCH: 25.1 pg — ABNORMAL LOW (ref 26.0–34.0)
MCV: 73.2 fL — ABNORMAL LOW (ref 78.0–100.0)
Monocytes Absolute: 0.7 10*3/uL (ref 0.1–1.0)
Monocytes Relative: 4 % (ref 3–12)
Platelets: 351 10*3/uL (ref 150–400)
RDW: 16.7 % — ABNORMAL HIGH (ref 11.5–15.5)
WBC: 18.6 10*3/uL — ABNORMAL HIGH (ref 4.0–10.5)

## 2012-12-12 LAB — COMPREHENSIVE METABOLIC PANEL
AST: 18 U/L (ref 0–37)
Albumin: 3 g/dL — ABNORMAL LOW (ref 3.5–5.2)
Alkaline Phosphatase: 103 U/L (ref 39–117)
BUN: 11 mg/dL (ref 6–23)
CO2: 27 mEq/L (ref 19–32)
Chloride: 102 mEq/L (ref 96–112)
Glucose, Bld: 213 mg/dL — ABNORMAL HIGH (ref 70–99)
Potassium: 3.8 mEq/L (ref 3.5–5.1)
Total Bilirubin: 0.5 mg/dL (ref 0.3–1.2)

## 2012-12-12 LAB — GLUCOSE, CAPILLARY
Glucose-Capillary: 189 mg/dL — ABNORMAL HIGH (ref 70–99)
Glucose-Capillary: 198 mg/dL — ABNORMAL HIGH (ref 70–99)
Glucose-Capillary: 153 mg/dL — ABNORMAL HIGH (ref 70–99)
Glucose-Capillary: 129 mg/dL — ABNORMAL HIGH (ref 70–99)

## 2012-12-12 LAB — INFLUENZA PANEL BY PCR (TYPE A & B)
H1N1 flu by pcr: NOT DETECTED
Influenza A By PCR: NEGATIVE
Influenza B By PCR: NEGATIVE

## 2012-12-12 MED ORDER — ACETAMINOPHEN 325 MG PO TABS
650.0000 mg | ORAL_TABLET | Freq: Four times a day (QID) | ORAL | Status: DC | PRN
  Administered 2012-12-12 – 2012-12-13 (×2): 650 mg via ORAL
  Filled 2012-12-12 (×2): qty 2

## 2012-12-12 MED ORDER — GUAIFENESIN 100 MG/5ML PO SYRP: 200.0000 mg | ORAL_SOLUTION | ORAL | Status: DC | PRN

## 2012-12-12 MED ORDER — ACETAMINOPHEN 650 MG RE SUPP: 650.0000 mg | Freq: Four times a day (QID) | RECTAL | Status: DC | PRN

## 2012-12-12 MED ORDER — SODIUM CHLORIDE 0.9 % IV SOLN
INTRAVENOUS | Status: AC
  Administered 2012-12-12: 05:00:00 via INTRAVENOUS

## 2012-12-12 MED ORDER — INSULIN GLARGINE 100 UNIT/ML ~~LOC~~ SOLN
30.0000 [IU] | Freq: Every day | SUBCUTANEOUS | Status: DC
  Administered 2012-12-12 – 2012-12-13 (×2): 30 [IU] via SUBCUTANEOUS
  Filled 2012-12-12 (×3): qty 0.3

## 2012-12-12 MED ORDER — GUAIFENESIN 100 MG/5ML PO SYRP
100.0000 mg | ORAL_SOLUTION | Freq: Four times a day (QID) | ORAL | Status: DC | PRN
  Administered 2012-12-12 (×2): 100 mg via ORAL
  Filled 2012-12-12 (×2): qty 5

## 2012-12-12 MED ORDER — PIPERACILLIN-TAZOBACTAM 3.375 G IVPB
3.3750 g | Freq: Three times a day (TID) | INTRAVENOUS | Status: DC
  Administered 2012-12-12 – 2012-12-14 (×6): 3.375 g via INTRAVENOUS
  Filled 2012-12-12 (×10): qty 50

## 2012-12-12 MED ORDER — SODIUM CHLORIDE 0.9 % IV SOLN
1250.0000 mg | Freq: Three times a day (TID) | INTRAVENOUS | Status: DC
  Administered 2012-12-12 – 2012-12-14 (×6): 1250 mg via INTRAVENOUS
  Filled 2012-12-12 (×10): qty 1250

## 2012-12-12 MED ORDER — SODIUM CHLORIDE 0.9 % IJ SOLN
3.0000 mL | Freq: Two times a day (BID) | INTRAMUSCULAR | Status: DC
  Administered 2012-12-12: 3 mL via INTRAVENOUS

## 2012-12-12 MED ORDER — BENZONATATE 100 MG PO CAPS
200.0000 mg | ORAL_CAPSULE | Freq: Three times a day (TID) | ORAL | Status: DC
  Administered 2012-12-12 – 2012-12-14 (×6): 200 mg via ORAL
  Filled 2012-12-12 (×8): qty 2

## 2012-12-12 MED ORDER — ONDANSETRON HCL 4 MG PO TABS: 4.0000 mg | ORAL_TABLET | Freq: Four times a day (QID) | ORAL | Status: DC | PRN

## 2012-12-12 MED ORDER — MORPHINE SULFATE 2 MG/ML IJ SOLN: 1.0000 mg | INTRAMUSCULAR | Status: DC | PRN

## 2012-12-12 MED ORDER — ONDANSETRON HCL 4 MG/2ML IJ SOLN
4.0000 mg | Freq: Four times a day (QID) | INTRAMUSCULAR | Status: DC | PRN
  Administered 2012-12-12: 4 mg via INTRAVENOUS
  Filled 2012-12-12: qty 2

## 2012-12-12 MED ORDER — ENOXAPARIN SODIUM 40 MG/0.4ML ~~LOC~~ SOLN
40.0000 mg | SUBCUTANEOUS | Status: DC
  Administered 2012-12-12 – 2012-12-13 (×2): 40 mg via SUBCUTANEOUS
  Filled 2012-12-12 (×3): qty 0.4

## 2012-12-12 MED ORDER — VITAMIN C 500 MG PO TABS: 1000.0000 mg | ORAL_TABLET | Freq: Once | ORAL | Status: DC | PRN

## 2012-12-12 MED ORDER — FERROUS FUMARATE 325 (106 FE) MG PO TABS
1.0000 | ORAL_TABLET | Freq: Every morning | ORAL | Status: DC
  Administered 2012-12-12 – 2012-12-13 (×2): 106 mg via ORAL
  Filled 2012-12-12 (×4): qty 1

## 2012-12-12 MED ORDER — IBUPROFEN 800 MG PO TABS
800.0000 mg | ORAL_TABLET | Freq: Three times a day (TID) | ORAL | Status: DC | PRN
  Administered 2012-12-12 – 2012-12-14 (×3): 800 mg via ORAL
  Filled 2012-12-12 (×3): qty 1

## 2012-12-12 MED ORDER — SIMVASTATIN 40 MG PO TABS
40.0000 mg | ORAL_TABLET | Freq: Every day | ORAL | Status: DC
  Administered 2012-12-12 – 2012-12-13 (×2): 40 mg via ORAL
  Filled 2012-12-12 (×3): qty 1

## 2012-12-12 MED ORDER — ADULT MULTIVITAMIN W/MINERALS CH
1.0000 | ORAL_TABLET | Freq: Every morning | ORAL | Status: DC
  Administered 2012-12-12 – 2012-12-14 (×3): 1 via ORAL
  Filled 2012-12-12 (×3): qty 1

## 2012-12-12 MED ORDER — INSULIN ASPART 100 UNIT/ML ~~LOC~~ SOLN
0.0000 [IU] | Freq: Three times a day (TID) | SUBCUTANEOUS | Status: DC
  Administered 2012-12-12 (×2): 2 [IU] via SUBCUTANEOUS
  Administered 2012-12-13: 3 [IU] via SUBCUTANEOUS
  Administered 2012-12-13: 2 [IU] via SUBCUTANEOUS

## 2012-12-12 MED ORDER — METHOCARBAMOL 750 MG PO TABS
750.0000 mg | ORAL_TABLET | Freq: Two times a day (BID) | ORAL | Status: DC
  Administered 2012-12-12: 750 mg via ORAL
  Filled 2012-12-12 (×2): qty 1

## 2012-12-12 MED ORDER — LISINOPRIL 10 MG PO TABS
10.0000 mg | ORAL_TABLET | Freq: Every morning | ORAL | Status: DC
  Administered 2012-12-12 – 2012-12-14 (×3): 10 mg via ORAL
  Filled 2012-12-12 (×3): qty 1

## 2012-12-12 MED ORDER — ALBUTEROL SULFATE HFA 108 (90 BASE) MCG/ACT IN AERS
2.0000 | INHALATION_SPRAY | RESPIRATORY_TRACT | Status: DC | PRN
  Filled 2012-12-12: qty 6.7

## 2012-12-12 MED ORDER — GUAIFENESIN 100 MG/5ML PO SYRP
200.0000 mg | ORAL_SOLUTION | Freq: Four times a day (QID) | ORAL | Status: DC | PRN
  Filled 2012-12-12: qty 10

## 2012-12-12 MED ORDER — METHOCARBAMOL 750 MG PO TABS
750.0000 mg | ORAL_TABLET | Freq: Three times a day (TID) | ORAL | Status: DC | PRN
  Administered 2012-12-13: 750 mg via ORAL
  Filled 2012-12-12: qty 1

## 2012-12-12 MED ORDER — INSULIN GLARGINE 100 UNIT/ML ~~LOC~~ SOLN
30.0000 [IU] | Freq: Once | SUBCUTANEOUS | Status: AC
  Administered 2012-12-12: 30 [IU] via SUBCUTANEOUS
  Filled 2012-12-12: qty 0.3

## 2012-12-12 NOTE — Progress Notes (Signed)
Admitted after midnight.  Chart reviewed. Pt examined. Complaining of incessant cough. Some shortness of breath. Mild pain with coughing. Add Lawyer. Notify Dr. Laneta Simmers patient's admission. Surgery may need to be postponed until pneumonia treated.  Crista Curb, M.D. [facility] [phone]

## 2012-12-12 NOTE — Progress Notes (Signed)
12/12/2012 1520  Nursing note Pt. Telemetry monitor removed per orders. Central Telemetry notified of pt. Now non-tele.  Mardy Lucier, Blanchard Kelch

## 2012-12-12 NOTE — ED Provider Notes (Signed)
Medical screening examination/treatment/procedure(s) were conducted as a shared visit with non-physician practitioner(s) and myself.  I personally evaluated the patient during the encounter.  EKG Interpretation    Date/Time:    Ventricular Rate:    PR Interval:    QRS Duration:   QT Interval:    QTC Calculation:   R Axis:     Text Interpretation:              Patient with cough, chest congestion. Patient has a thymoma which is to be operated on later this week. Workup today is concerning for pneumonia. Patient dyspneic and hypoxic, will require hospitalization.  Gilda Crease, MD 12/12/12 2026

## 2012-12-12 NOTE — Care Management Note (Unsigned)
    Page 1 of 1   12/12/2012     3:01:04 PM   CARE MANAGEMENT NOTE 12/12/2012  Patient:  Kaitlin [name], Kaitlin [name]   Account Number:  [id number]  Date Initiated:  12/12/2012  Documentation initiated by:  Courtland Coppa  Subjective/Objective Assessment:   PT ADM ON 12/11/12 WITH PNA.  PTA, PT INDEPENDENT, LIVES WITH SPOUSE.     Action/Plan:   WILL FOLLOW FOR DC NEEDS AS PT PROGRESSES.   Anticipated DC Date:  12/15/2012   Anticipated DC Plan:  HOME/SELF CARE      DC Planning Services  CM consult      Choice offered to / List presented to:             Status of service:  In process, will continue to follow Medicare Important Message given?   (If response is "NO", the following Medicare IM given date fields will be blank) Date Medicare IM given:   Date Additional Medicare IM given:    Discharge Disposition:    Per UR Regulation:  Reviewed for med. necessity/level of care/duration of stay  If discussed at Long Length of Stay Meetings, dates discussed:    Comments:

## 2012-12-12 NOTE — ED Notes (Signed)
Attempted report 

## 2012-12-12 NOTE — Progress Notes (Signed)
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 She had a right mediastinotomy for biopsy on 10/27 and had a slow recovery from that. She was found to have some thickening in the tonsillar region with hypermetabolic activity on PET scan and underwent exam under anesthesia and removal of this tissue by Dr. Lazarus Salines on 11/20. Pathology was negative for malignancy. She says she started developing a cough and shortness of breath last Wednesday. She had fever to 101.9 yesterday. She went to see Dr. Lazarus Salines for followup and was sent to the ER. She had a Chest CT to rule out PE which was not a great study but did not show any PE. 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 There was a moderate right pleural effusion with associated airspace opacity that could be atelectasis or pneumonia. She has also been having right sided chest pain but that has been present for months.  Subjective:  Does not feel great. Coughing whenever she deep breaths or talks but no sputum production.  Objective: Vital signs in last 24 hours: Temp:  [98.4 F (36.9 C)-99.6 F (37.6 C)] 99.6 F (37.6 C) (12/02 0457) Pulse Rate:  [108-123] 113 (12/02 0917) Cardiac Rhythm:  [-] Sinus tachycardia (12/02 0800) Resp:  [20-28] 22 (12/02 0457) BP: (114-158)/(43-97) 116/48 mmHg (12/02 0917) SpO2:  [92 %-100 %] 98 % (12/02 0457) Weight:  [158.532 kg (349 lb 8 oz)-158.9 kg (350 lb 5 oz)] 158.9 kg (350 lb 5 oz) (12/02 0115)  Hemodynamic parameters for last 24 hours:    Intake/Output from previous day:   Intake/Output this shift: Total I/O In: 480 [P.O.:480] Out: 400 [Urine:400]  General appearance: alert and cooperative Neurologic: intact Heart: regular rate and rhythm, S1, S2 normal, no murmur, click, rub or gallop Lungs: clear but distant breath sounds  Lab Results:  Recent Labs  12/11/12 2120 12/11/12 2145 12/12/12 0553  WBC 20.2*  --  18.6*  HGB 11.2* 11.2* 10.6*  HCT 31.9*  33.0* 30.9*  PLT 406*  --  351   BMET:  Recent Labs  12/11/12 2145 12/12/12 0553  NA 145 140  K 3.9 3.8  CL 107 102  CO2  --  27  GLUCOSE 119* 213*  BUN 16 11  CREATININE 1.00 0.78  CALCIUM  --  9.2    PT/INR: No results found for this basename: LABPROT, INR,  in the last 72 hours ABG    Component Value Date/Time   PHART 7.379 11/30/2012 1905   HCO3 27.4* 11/30/2012 1905   TCO2 25 12/11/2012 2145   O2SAT 88.7 11/30/2012 1905   CBG (last 3)   Recent Labs  12/12/12 0149 12/12/12 0601 12/12/12 1120  GLUCAP 189* 198* 153*    CLINICAL DATA: Possible bronchitis  EXAM:  CT ANGIOGRAPHY CHEST WITH CONTRAST  TECHNIQUE:  Multidetector CT imaging of the chest was performed using the  standard protocol during bolus administration of intravenous  contrast. Multiplanar CT image reconstructions including MIPs were  obtained to evaluate the vascular anatomy.  CONTRAST: 80mL OMNIPAQUE IOHEXOL 350 MG/ML SOLN  COMPARISON: 12/11/2012 chest radiograph, 11/15/2012 PET-CT  FINDINGS:  Suboptimal contrast bolus timing. No central pulmonary arterial  filling defect. Mixing of contrast and non-opacified blood within  the lobar and more peripheral branches limits evaluation.  Cardiomegaly. Trace pericardial fluid. Moderate right pleural  effusion.  Continued enlargement of the right paramediastinal/anterior  mediastinal mass, with margins inseparable from the right  mediastinum and right upper lung. At the level of  the previous  measurement more superiorly, the mass measures 6.4 x 10.9 cm as  compared to 6.5 x 10.2 cm however slightly inferiorly, the mass  measures 8.4 x 9.0 cm as compared to 6.3 x 6.0 cm on the prior.  Prominent mediastinal and right hilar lymph nodes. As index,  subcarinal measuring 1.4 cm short axis.  Limited upper abdominal images show nothing acute.  Central airways are patent. No pneumothorax. Mild linear right lung  opacities. Respiratory motion degrades  detailed lung parenchymal  evaluation.  Mild multilevel degenerative change. No acute osseous finding.  Review of the MIP images confirms the above findings.  IMPRESSION:  Suboptimal examination to evaluate for pulmonary embolism due to  motion and bolus timing. Within this limitation; no large central  filling defect. The lobar and more peripheral branches are not well  evaluated and pulmonary embolism is not excluded.  Interval enlargement of the anterior mediastinal mass.  Moderate right pleural effusion with associated airspace opacity;  atelectasis versus pneumonia, increased in the interval.  Electronically Signed  By: Jearld Lesch M.D.  On: 12/11/2012 23:32   Assessment/Plan:  She likely has pneumonia based on the fever and leukocytosis with her cough and dyspnea. When I saw her in the office last on 11/19 she was feeling fine with only mild right chest discomfort. I think she should be treated for pneumonia and have surgery rescheduled when she improves. Hopefully the right pleural effusion will not progress and require drainage. I am assuming this is parapneumonic but I don't think we can completely exclude a malignant effusion. If it progresses she should have a thoracentesis. I will continue to follow.  Alleen Borne 12/12/2012

## 2012-12-12 NOTE — Progress Notes (Signed)
ANTIBIOTIC CONSULT NOTE - INITIAL  Pharmacy Consult for Vancomycin and Zosyn  Indication: rule out pneumonia  Allergies  Allergen Reactions  . Amoxicillin-Pot Clavulanate Diarrhea    Patient Measurements: Height: 5\' 9"  (175.3 cm) Weight: 350 lb 5 oz (158.9 kg) IBW/kg (Calculated) : 66.2 Adjusted Body Weight: 105 kg   Vital Signs: Temp: 99.1 F (37.3 C) (12/02 0144) Temp src: Oral (12/02 0144) BP: 134/64 mmHg (12/02 0144) Pulse Rate: 120 (12/02 0144) Intake/Output from previous day:   Intake/Output from this shift:    Labs:  Recent Labs  12/11/12 2120 12/11/12 2145  WBC 20.2*  --   HGB 11.2* 11.2*  PLT 406*  --   CREATININE  --  1.00   Estimated Creatinine Clearance: 115.9 ml/min (by C-G formula based on Cr of 1). No results found for this basename: VANCOTROUGH, VANCOPEAK, VANCORANDOM, GENTTROUGH, GENTPEAK, GENTRANDOM, TOBRATROUGH, TOBRAPEAK, TOBRARND, AMIKACINPEAK, AMIKACINTROU, AMIKACIN,  in the last 72 hours   Microbiology: No results found for this or any previous visit (from the past 720 hour(s)).  Medical History: Past Medical History  Diagnosis Date  . Diabetes mellitus   . High cholesterol   . Leukocytosis   . Anemia   . Obesity   . Sickle cell trait   . Hypertension     takes medicine to protect kidneys, does not have HTN  . Bronchitis   . Pneumonia     Medications:  Prescriptions prior to admission  Medication Sig Dispense Refill  . acetaminophen (TYLENOL) 500 MG tablet Take 1,000 mg by mouth every 4 (four) hours as needed for moderate pain.      Marland Kitchen albuterol (PROVENTIL HFA;VENTOLIN HFA) 108 (90 BASE) MCG/ACT inhaler Inhale 2 puffs into the lungs every 4 (four) hours as needed for wheezing.  1 Inhaler  0  . ferrous fumarate (HEMOCYTE - 106 MG FE) 325 (106 FE) MG TABS tablet Take 1 tablet by mouth every morning.       . furosemide (LASIX) 20 MG tablet Take 10 mg by mouth every morning.       Marland Kitchen ibuprofen (ADVIL,MOTRIN) 800 MG tablet Take 800 mg  by mouth every 8 (eight) hours as needed for moderate pain.      Marland Kitchen insulin glargine (LANTUS) 100 UNIT/ML injection Inject 30 Units into the skin at bedtime.      . insulin regular (NOVOLIN R,HUMULIN R) 100 units/mL injection Inject 30 Units into the skin 3 (three) times daily before meals.       Marland Kitchen lisinopril (PRINIVIL,ZESTRIL) 10 MG tablet Take 10 mg by mouth every morning.       . metFORMIN (GLUCOPHAGE) 500 MG tablet Take 500 mg by mouth 2 (two) times daily with a meal.      . methocarbamol (ROBAXIN) 750 MG tablet Take 1 tablet (750 mg total) by mouth 2 (two) times daily.  10 tablet  0  . Multiple Vitamin (MULTIVITAMIN WITH MINERALS) TABS Take 1 tablet by mouth every morning.       . simvastatin (ZOCOR) 40 MG tablet Take 40 mg by mouth at bedtime.      Marland Kitchen Specialty Vitamins Products (VITAMINS FOR HAIR PO) Take 1 tablet by mouth every morning.       . vitamin C (ASCORBIC ACID) 500 MG tablet Take 1,000-2,000 mg by mouth once as needed (for cold).        Assessment: 47 yo female with SOB/cough, possible PNA, for empiric antibiotics.  Vancomycin 1 g IV given in ED at 0115  Goal of Therapy:  Vancomycin trough level 15-20 mcg/ml  Plan:  Vancomycin 1250 mg IV q8h Zosyn 3.375 g IV q8h   Eddie Candle 12/12/2012,4:32 AM

## 2012-12-12 NOTE — Progress Notes (Signed)
Returned call to [place] and received report on pt.

## 2012-12-12 NOTE — ED Notes (Signed)
Admitting Md at bedside

## 2012-12-12 NOTE — H&P (Signed)
[facility] History and Physical  Kaitlin [name] ZOX:[id number] DOB: 1965/01/21 DOA: 12/11/2012  Referring physician: ER physician. PCP: Teodoro Spray, MD   Chief Complaint: Shortness of breath and fever.  HPI: Kaitlin [name] is a 47 y.o. female who was recently diagnosed with thymoma and is originally scheduled to have surgery later in the week has been experiencing right-sided chest pain with shortness of breath with fever and chills over the last one week. Patient will come to the ER last week and was thought to have a muscle sprain. Due to persistent pain and fever chills patient came to the ER and CAT scan today shows possible pneumonia with right pleural effusion. Patient has been admitted for further management. Patient presently is not in distress and denies any nausea vomiting abdominal pain. Patient was admitted October 20 for biopsy.   Review of Systems: As presented in the history of presenting illness, rest negative.  Past Medical History  Diagnosis Date  . Diabetes mellitus   . High cholesterol   . Leukocytosis   . Anemia   . Obesity   . Sickle cell trait   . Hypertension     takes medicine to protect kidneys, does not have HTN  . Bronchitis   . Pneumonia    Past Surgical History  Procedure Laterality Date  . Tonsillectomy    . Tubal ligation    . Mediastinotomy chamberlain mcneil Right 11/06/2012    Procedure: MEDIASTINOTOMY CHAMBERLAIN MCNEILPROCEDURE;  Surgeon: Alleen Borne, MD;  Location: [facility] OR;  Service: Thoracic;  Laterality: Right;  . Lymph node biopsy Right 11/30/2012    Procedure: RIGHT TONSIL BIOPSY WITH FRESH FROZEN ANALYSIS;  Surgeon: Flo Shanks, MD;  Location: [facility] OR;  Service: ENT;  Laterality: Right;   Social History:  reports that she has never smoked. She has never used smokeless tobacco. She reports that she does not drink alcohol or use illicit drugs. Where does patient live home. Can patient participate in ADLs?  Yes.  Allergies  Allergen Reactions  . Amoxicillin-Pot Clavulanate Diarrhea    Family History:  Family History  Problem Relation Age of Onset  . Heart disease Father   . Diabetes type II      Family HX      Prior to Admission medications   Medication Sig Start Date End Date Taking? Authorizing Provider  acetaminophen (TYLENOL) 500 MG tablet Take 1,000 mg by mouth every 4 (four) hours as needed for moderate pain.   Yes Historical Provider, MD  albuterol (PROVENTIL HFA;VENTOLIN HFA) 108 (90 BASE) MCG/ACT inhaler Inhale 2 puffs into the lungs every 4 (four) hours as needed for wheezing. 10/30/12  Yes Arthor Captain, PA-C  ferrous fumarate (HEMOCYTE - 106 MG FE) 325 (106 FE) MG TABS tablet Take 1 tablet by mouth every morning.    Yes Historical Provider, MD  furosemide (LASIX) 20 MG tablet Take 10 mg by mouth every morning.    Yes Historical Provider, MD  ibuprofen (ADVIL,MOTRIN) 800 MG tablet Take 800 mg by mouth every 8 (eight) hours as needed for moderate pain.   Yes Historical Provider, MD  insulin glargine (LANTUS) 100 UNIT/ML injection Inject 30 Units into the skin at bedtime.   Yes Historical Provider, MD  insulin regular (NOVOLIN R,HUMULIN R) 100 units/mL injection Inject 30 Units into the skin 3 (three) times daily before meals.    Yes Historical Provider, MD  lisinopril (PRINIVIL,ZESTRIL) 10 MG tablet Take 10 mg by mouth every morning.    Yes  Historical Provider, MD  metFORMIN (GLUCOPHAGE) 500 MG tablet Take 500 mg by mouth 2 (two) times daily with a meal.   Yes Historical Provider, MD  methocarbamol (ROBAXIN) 750 MG tablet Take 1 tablet (750 mg total) by mouth 2 (two) times daily. 12/05/12  Yes Rudene Anda, PA-C  Multiple Vitamin (MULTIVITAMIN WITH MINERALS) TABS Take 1 tablet by mouth every morning.    Yes Historical Provider, MD  simvastatin (ZOCOR) 40 MG tablet Take 40 mg by mouth at bedtime.   Yes Historical Provider, MD  Specialty Vitamins Products (VITAMINS FOR HAIR  PO) Take 1 tablet by mouth every morning.    Yes Historical Provider, MD  vitamin C (ASCORBIC ACID) 500 MG tablet Take 1,000-2,000 mg by mouth once as needed (for cold).    Yes Historical Provider, MD    Physical Exam: Filed Vitals:   12/12/12 0000 12/12/12 0015 12/12/12 0045 12/12/12 0115  BP: 158/71 146/43 141/82 148/45  Pulse: 116 108 110 115  Temp:      TempSrc:      Resp: 24 26 25 24   Weight:      SpO2: 98% 98% 98% 96%     General:  Well-developed well-nourished.  Eyes: Anicteric no pallor.  ENT: No discharge from the ears eyes nose mouth.  Neck: No mass felt.  Cardiovascular: S1-S2 heard.  Respiratory: No rhonchi or crepitations.  Abdomen: Soft nontender bowel sounds present.  Skin: No rash.  Musculoskeletal: No edema.  Psychiatric: Appears normal.  Neurologic: Alert awake oriented to time place and person. Moves all extremities.  Labs on Admission:  Basic Metabolic Panel:  Recent Labs Lab 12/05/12 1700 12/11/12 2145  NA 141 145  K 3.9 3.9  CL 101 107  CO2 29  --   GLUCOSE 139* 119*  BUN 14 16  CREATININE 0.77 1.00  CALCIUM 10.3  --    Liver Function Tests:  Recent Labs Lab 12/05/12 1700  AST 21  ALT 24  ALKPHOS 101  BILITOT 0.2*  PROT 6.9  ALBUMIN 3.7   No results found for this basename: LIPASE, AMYLASE,  in the last 168 hours No results found for this basename: AMMONIA,  in the last 168 hours CBC:  Recent Labs Lab 12/05/12 1700 12/11/12 2120 12/11/12 2145  WBC 17.9* 20.2*  --   NEUTROABS 6.1  --   --   HGB 13.1 11.2* 11.2*  HCT 38.0 31.9* 33.0*  MCV 74.4* 74.4*  --   PLT 272 406*  --    Cardiac Enzymes:  Recent Labs Lab 12/05/12 1700 12/05/12 1910  TROPONINI <0.30 <0.30    BNP (last 3 results)  Recent Labs  10/17/12 0930  PROBNP 298.5*   CBG:  Recent Labs Lab 12/05/12 1649  GLUCAP 142*    Radiological Exams on Admission: Dg Chest 2 View  12/11/2012   CLINICAL DATA:  Short of breath fever. Mediastinal  mass on PET-CT. Biopsy consistent with thymoma.  EXAM: CHEST  2 VIEW  COMPARISON:  Chest radiograph 12/05/2012, PET-CT 11/15/2012  FINDINGS: Mildly enlarged cardiac silhouette. Large right paramediastinal mass not changed. There is a small right effusion which is also similar. No focal consolidation. No pneumothorax.  IMPRESSION: 1. Stable large right mediastinal mass consistent with of thymoma 2. Stable small right effusion   Electronically Signed   By: Genevive Bi M.D.   On: 12/11/2012 18:30   Ct Angio Chest Pe W/cm &/or Wo Cm  12/11/2012   CLINICAL DATA:  Possible bronchitis  EXAM:  CT ANGIOGRAPHY CHEST WITH CONTRAST  TECHNIQUE: Multidetector CT imaging of the chest was performed using the standard protocol during bolus administration of intravenous contrast. Multiplanar CT image reconstructions including MIPs were obtained to evaluate the vascular anatomy.  CONTRAST:  80mL OMNIPAQUE IOHEXOL 350 MG/ML SOLN  COMPARISON:  12/11/2012 chest radiograph, 11/15/2012 PET-CT  FINDINGS: Suboptimal contrast bolus timing. No central pulmonary arterial filling defect. Mixing of contrast and non-opacified blood within the lobar and more peripheral branches limits evaluation.  Cardiomegaly. Trace pericardial fluid. Moderate right pleural effusion.  Continued enlargement of the right paramediastinal/anterior mediastinal mass, with margins inseparable from the right mediastinum and right upper lung. At the level of the previous measurement more superiorly, the mass measures 6.4 x 10.9 cm as compared to 6.5 x 10.2 cm however slightly inferiorly, the mass measures 8.4 x 9.0 cm as compared to 6.3 x 6.0 cm on the prior. Prominent mediastinal and right hilar lymph nodes. As index, subcarinal measuring 1.4 cm short axis.  Limited upper abdominal images show nothing acute.  Central airways are patent. No pneumothorax. Mild linear right lung opacities. Respiratory motion degrades detailed lung parenchymal evaluation.  Mild  multilevel degenerative change.  No acute osseous finding.  Review of the MIP images confirms the above findings.  IMPRESSION: Suboptimal examination to evaluate for pulmonary embolism due to motion and bolus timing. Within this limitation; no large central filling defect. The lobar and more peripheral branches are not well evaluated and pulmonary embolism is not excluded.  Interval enlargement of the anterior mediastinal mass.  Moderate right pleural effusion with associated airspace opacity; atelectasis versus pneumonia, increased in the interval.   Electronically Signed   By: Jearld Lesch M.D.   On: 12/11/2012 23:32     Assessment/Plan Principal Problem:   Pneumonia Active Problems:   Leukocytosis   Thymoma   Diabetes mellitus   HTN (hypertension)   HLD (hyperlipidemia)   1. Pneumonia - since patient was recently admitted for biopsy patient has been placed on vancomycin and Zosyn for community-acquired pneumonia. Closely follow this status. Patient is a pleuritic type of chest pain on the right side and CAT scan does show pleural effusion. Check influenza panel. If pain worsens may consult patient's cardiothoracic surgeon Dr. Laneta Simmers. Repeat chest x-ray in a.m. 2. Thymoma - as per the CAT scan the size has increased. Further recommendations per cardiothoracic surgeon. 3. Leukocytosis - probably from #1. Closely follow CBC. 4. Diabetes mellitus - closely follow CBGs with sliding-scale coverage. 5. Hypertension - continue home medications. 6. Hyperlipidemia - continue home medications. 7. Anemia - follow CBC.    Code Status: Full code.  Family Communication: Family at the bedside.  Disposition Plan: Admit to inpatient    [facility] Pager [phone].  If 7PM-7AM, please contact night-coverage [email or web address] Password [email or web address] 12/12/2012, 1:26 AM

## 2012-12-13 ENCOUNTER — Other Ambulatory Visit (HOSPITAL_COMMUNITY): Payer: BC Managed Care – PPO

## 2012-12-13 DIAGNOSIS — J189 Pneumonia, unspecified organism: Principal | ICD-10-CM

## 2012-12-13 DIAGNOSIS — D150 Benign neoplasm of thymus: Secondary | ICD-10-CM

## 2012-12-13 DIAGNOSIS — M7989 Other specified soft tissue disorders: Secondary | ICD-10-CM

## 2012-12-13 LAB — GLUCOSE, CAPILLARY
Glucose-Capillary: 220 mg/dL — ABNORMAL HIGH (ref 70–99)
Glucose-Capillary: 157 mg/dL — ABNORMAL HIGH (ref 70–99)
Glucose-Capillary: 59 mg/dL — ABNORMAL LOW (ref 70–99)

## 2012-12-13 LAB — LEGIONELLA ANTIGEN, URINE

## 2012-12-13 MED ORDER — METFORMIN HCL 500 MG PO TABS
500.0000 mg | ORAL_TABLET | Freq: Two times a day (BID) | ORAL | Status: DC
  Filled 2012-12-13 (×2): qty 1

## 2012-12-13 MED ORDER — INSULIN ASPART 100 UNIT/ML ~~LOC~~ SOLN
30.0000 [IU] | Freq: Three times a day (TID) | SUBCUTANEOUS | Status: DC
  Administered 2012-12-13 (×2): 30 [IU] via SUBCUTANEOUS
  Administered 2012-12-14: 20 [IU] via SUBCUTANEOUS
  Administered 2012-12-14: 30 [IU] via SUBCUTANEOUS

## 2012-12-13 MED ORDER — HYDROCOD POLST-CHLORPHEN POLST 10-8 MG/5ML PO LQCR
5.0000 mL | Freq: Once | ORAL | Status: AC
  Administered 2012-12-13: 5 mL via ORAL
  Filled 2012-12-13: qty 5

## 2012-12-13 NOTE — Procedures (Signed)
US examination thorax for effusion. Performed by Dr. Tyson Alias. Small rt pocket of free flowing effusion noted. High risk for pneumothorax therefore thoracentesis decline. Can see lung to pleural surface with inspiratory effort See picture on chart. No loculation, no fibrinous material  Kaitlin [name]. Tyson Alias, MD, FACP Pgr: [phone] [facility]   Brett Canales Minor ACNP [facility] Pager [phone] till 3 pm If no answer page [phone] 12/13/2012, 11:39 AM

## 2012-12-13 NOTE — Progress Notes (Signed)
*  PRELIMINARY RESULTS* Vascular Ultrasound Lower extremity venous duplex has been completed.  Preliminary findings: no evidence of DVT in visualized veins.   Farrel Demark, RDMS, RVT  12/13/2012, 1:55 PM

## 2012-12-13 NOTE — Progress Notes (Signed)
[facility]                                                                                Patient Demographics  Kaitlin [name], is a 47 y.o. female, DOB - 1965-03-25, AOZ:[id number]  Admit date - 12/11/2012   Admitting Physician Eduard Clos, MD  Outpatient Primary MD for the patient is Teodoro Spray, MD  LOS - 2   Chief Complaint  Patient presents with  . Shortness of Breath  . Cough        Assessment & Plan    1. Cough fever shortness of breath, right-sided pleuritic chest pain. She is evidence of infiltrate and clinically has HCAP, continue present antibiotics, she does have small amount of effusion in the right lower lobe at the site of pleuritic chest pain. Pulmonary to evaluate to see if she would help from thoracentesis, question if this is parapneumonic effusion however she's not spiking fevers, Leokocytosis has improved marginally. She has also been seen by cardiothoracic surgery.    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      3. Diabetes mellitus type 2. On Lantus, sliding scale insulin added. Hold Glucophage.   CBG (last 3)   Recent Labs  12/12/12 2122 12/13/12 0641 12/13/12 1130  GLUCAP 142* 198* 220*       4.HTN - continue lisinopril.      5. Dyslipidemia. Continue home dose statin.      Code Status: Full  Family Communication: None present  Disposition Plan: Home   Procedures CT angiogram chest   Consults  pulmonary, cardiothoracic surgery   Medications  Scheduled Meds: . benzonatate  200 mg Oral TID  . enoxaparin (LOVENOX) injection  40 mg Subcutaneous Q24H  . ferrous fumarate  1 tablet Oral q morning - 10a  . insulin aspart  0-9 Units Subcutaneous TID WC  . insulin aspart  30 Units Subcutaneous TID WC  . insulin glargine  30 Units Subcutaneous QHS  . lisinopril  10 mg Oral q morning -  10a  . multivitamin with minerals  1 tablet Oral q morning - 10a  . piperacillin-tazobactam (ZOSYN)  IV  3.375 g Intravenous Q8H  . simvastatin  40 mg Oral QHS  . sodium chloride  3 mL Intravenous Q12H  . vancomycin  1,250 mg Intravenous Q8H   Continuous Infusions:  PRN Meds:.acetaminophen, acetaminophen, albuterol, ibuprofen, methocarbamol, morphine injection, ondansetron (ZOFRAN) IV, ondansetron  DVT Prophylaxis  Lovenox   Lab Results  Component Value Date   PLT 351 12/12/2012    Antibiotics   Anti-infectives   Start     Dose/Rate Route Frequency Ordered Stop   12/12/12 0800  vancomycin (VANCOCIN) 1,250 mg in sodium chloride 0.9 % 250 mL IVPB     1,250 mg 166.7 mL/hr over 90 Minutes Intravenous Every 8 hours 12/12/12 0448     12/12/12 0800  piperacillin-tazobactam (ZOSYN) IVPB 3.375 g     3.375 g 12.5 mL/hr over 240 Minutes Intravenous Every 8 hours 12/12/12 0448     12/12/12 0000  vancomycin (VANCOCIN) IVPB 1000 mg/200 mL  premix     1,000 mg 200 mL/hr over 60 Minutes Intravenous  Once 12/11/12 2353 12/12/12 0213   12/12/12 0000  piperacillin-tazobactam (ZOSYN) IVPB 3.375 g     3.375 g 12.5 mL/hr over 240 Minutes Intravenous  Once 12/11/12 2353 12/12/12 9604          Subjective:   Marius Ditch today has, No headache, No chest pain, No abdominal pain - No Nausea, No new weakness tingling or numbness, No Cough - SOB.   Objective:   Filed Vitals:   12/12/12 0917 12/12/12 1820 12/12/12 2124 12/13/12 0526  BP: 116/48 131/81 130/67 107/42  Pulse: 113 114 108 96  Temp:  99.5 F (37.5 C) 98.8 F (37.1 C) 98.8 F (37.1 C)  TempSrc:  Oral Oral Oral  Resp:  20 20 22   Height:      Weight:      SpO2:  95% 98% 95%    Wt Readings from Last 3 Encounters:  12/12/12 158.9 kg (350 lb 5 oz)  11/30/12 154.3 kg (340 lb 2.7 oz)  11/30/12 154.3 kg (340 lb 2.7 oz)     Intake/Output Summary (Last 24 hours) at 12/13/12 1208 Last data filed at 12/13/12 1149  Gross per 24  hour  Intake   1620 ml  Output   1350 ml  Net    270 ml    Exam Awake Alert, Oriented X 3, No new F.N deficits, Normal affect [place].AT,PERRAL Supple Neck,No JVD, No cervical lymphadenopathy appriciated.  Symmetrical Chest wall movement, Good air movement bilaterally, reduced breath sounds in the right base RRR,No Gallops,Rubs or new Murmurs, No Parasternal Heave +ve B.Sounds, Abd Soft, Non tender, No organomegaly appriciated, No rebound - guarding or rigidity. No Cyanosis, Clubbing or edema, No new Rash or bruise    Data Review   Micro Results No results found for this or any previous visit (from the past 240 hour(s)).  Radiology Reports Dg Chest 2 View  12/11/2012   CLINICAL DATA:  Short of breath fever. 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  EXAM: CHEST  2 VIEW  COMPARISON:  Chest radiograph 12/05/2012, PET-CT 11/15/2012  FINDINGS: Mildly enlarged cardiac silhouette. Large right paramediastinal mass not changed. There is a small right effusion which is also similar. No focal consolidation. No pneumothorax.  IMPRESSION: 1. Stable large right mediastinal mass consistent with of thymoma 2. Stable small right effusion   Electronically Signed   By: Genevive Bi M.D.   On: 12/11/2012 18:30     Ct Angio Chest Pe W/cm &/or Wo Cm  12/11/2012   CLINICAL DATA:  Possible bronchitis  EXAM: CT ANGIOGRAPHY CHEST WITH CONTRAST  TECHNIQUE: Multidetector CT imaging of the chest was performed using the standard protocol during bolus administration of intravenous contrast. Multiplanar CT image reconstructions including MIPs were obtained to evaluate the vascular anatomy.  CONTRAST:  80mL OMNIPAQUE IOHEXOL 350 MG/ML SOLN  COMPARISON:  12/11/2012 chest radiograph, 11/15/2012 PET-CT  FINDINGS: Suboptimal contrast bolus timing. No central pulmonary arterial filling defect. Mixing of contrast and non-opacified blood within the lobar and more peripheral branches limits evaluation.   Cardiomegaly. Trace pericardial fluid. Moderate right pleural effusion.  Continued enlargement of the right paramediastinal/anterior mediastinal mass, with margins inseparable from the right mediastinum and right upper lung. At the level of the previous measurement more superiorly, the mass measures 6.4 x 10.9 cm as compared to 6.5 x 10.2 cm however slightly inferiorly, the mass measures 8.4 x 9.0 cm as compared to  6.3 x 6.0 cm on the prior. Prominent mediastinal and right hilar lymph nodes. As index, subcarinal measuring 1.4 cm short axis.  Limited upper abdominal images show nothing acute.  Central airways are patent. No pneumothorax. Mild linear right lung opacities. Respiratory motion degrades detailed lung parenchymal evaluation.  Mild multilevel degenerative change.  No acute osseous finding.  Review of the MIP images confirms the above findings.  IMPRESSION: Suboptimal examination to evaluate for pulmonary embolism due to motion and bolus timing. Within this limitation; no large central filling defect. The lobar and more peripheral branches are not well evaluated and pulmonary embolism is not excluded.  Interval enlargement of the anterior mediastinal mass.  Moderate right pleural effusion with associated airspace opacity; atelectasis versus pneumonia, increased in the interval.   Electronically Signed   By: Jearld Lesch M.D.   On: 12/11/2012 23:32     Dg Chest Port 1 View  12/12/2012   CLINICAL DATA:  Pleural effusion.  EXAM: PORTABLE CHEST - 1 VIEW  COMPARISON:  CT 12/11/2012.  FINDINGS: Hemidiaphragms and lower lung fields are not imaged. Large mediastinal mass is present as noted on prior chest CT of 12/11/2012. Visualized mid and upper lungs are clear. Atelectasis and/or infiltrates in the lung bases cannot be excluded. Pleural effusions cannot be excluded . Cardiomegaly. Normal pulmonary vascularity.  IMPRESSION: 1. Limited exam, the lung bases and hemidiaphragms are not imaged. No infiltrate  noted in the mid or upper lungs. Atelectasis and/or infiltrates in the lung bases cannot be excluded. Pleural effusions cannot be excluded .  2.  Mediastinal mass is present as noted on CT of 12/11/2012.   Electronically Signed   By: Maisie Fus  Register   On: 12/12/2012 09:08    CBC  Recent Labs Lab 12/11/12 2120 12/11/12 2145 12/12/12 0553  WBC 20.2*  --  18.6*  HGB 11.2* 11.2* 10.6*  HCT 31.9* 33.0* 30.9*  PLT 406*  --  351  MCV 74.4*  --  73.2*  MCH 26.1  --  25.1*  MCHC 35.1  --  34.3  RDW 16.4*  --  16.7*  LYMPHSABS  --   --  8.7*  MONOABS  --   --  0.7  EOSABS  --   --  0.2  BASOSABS  --   --  0.0    Chemistries   Recent Labs Lab 12/11/12 2145 12/12/12 0553  NA 145 140  K 3.9 3.8  CL 107 102  CO2  --  27  GLUCOSE 119* 213*  BUN 16 11  CREATININE 1.00 0.78  CALCIUM  --  9.2  AST  --  18  ALT  --  22  ALKPHOS  --  103  BILITOT  --  0.5   ------------------------------------------------------------------------------------------------------------------ estimated creatinine clearance is 144.8 ml/min (by C-G formula based on Cr of 0.78). ------------------------------------------------------------------------------------------------------------------ No results found for this basename: HGBA1C,  in the last 72 hours ------------------------------------------------------------------------------------------------------------------ No results found for this basename: CHOL, HDL, LDLCALC, TRIG, CHOLHDL, LDLDIRECT,  in the last 72 hours ------------------------------------------------------------------------------------------------------------------  Recent Labs  12/12/12 0553  TSH 0.852   ------------------------------------------------------------------------------------------------------------------ No results found for this basename: VITAMINB12, FOLATE, FERRITIN, TIBC, IRON, RETICCTPCT,  in the last 72 hours  Coagulation profile No results found for this  basename: INR, PROTIME,  in the last 168 hours  No results found for this basename: DDIMER,  in the last 72 hours  Cardiac Enzymes No results found for this basename: CK, CKMB, TROPONINI, MYOGLOBIN,  in the last  168 hours ------------------------------------------------------------------------------------------------------------------ No components found with this basename: POCBNP,      Time Spent in minutes  35   Sahar Ryback K M.D on 12/13/2012 at 12:08 PM  Between 7am to 7pm - Pager - [phone]  After 7pm go to [email or web address] - password [email or web address]  And look for the night coverage person covering for me after hours  [facility] Office  [phone]

## 2012-12-13 NOTE — Consult Note (Signed)
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 She was admitted 12-01 and treated with abx and diuresis. Note she is tender rt popliteal area and recent CT angio was of poor quality. She has a moderate rt effusion,enlarged rt para mediastinal mass on CT scan and PCCM asked to consult for possible thoracentesis. She further notes increased lower ext edema. She has been self medicating her DM in her room without staff aware.  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 She was admitted 12-01 and treated with abx and diuresis. Note she is tender rt popliteal area and recent CT angio was of poor quality. She has a moderate rt effusion,enlarged rt para mediastinal mass on CT scan and PCCM asked to consult for possible thoracentesis. She further notes increased lower ext edema. She has been self medicating her DM in her room without staff aware.  PAST MEDICAL HISTORY :  Past Medical History  Diagnosis Date  . Diabetes mellitus   . High cholesterol   . Leukocytosis   . Anemia   . Obesity   . Sickle cell trait   . Hypertension     takes medicine to protect kidneys, does not have HTN  . Bronchitis   . Pneumonia    Past Surgical History  Procedure Laterality Date  . Tonsillectomy     . Tubal ligation    . Mediastinotomy chamberlain mcneil Right 11/06/2012    Procedure: MEDIASTINOTOMY CHAMBERLAIN MCNEILPROCEDURE;  Surgeon: Alleen Borne, MD;  Location: [facility] OR;  Service: Thoracic;  Laterality: Right;  . Lymph node biopsy Right 11/30/2012    Procedure: RIGHT TONSIL BIOPSY WITH FRESH FROZEN ANALYSIS;  Surgeon: Flo Shanks, MD;  Location: [facility];  Service: ENT;  Laterality: Right;   Prior to Admission medications   Medication Sig Start Date End Date Taking? Authorizing Provider  acetaminophen (TYLENOL) 500 MG tablet Take 1,000 mg by mouth every 4 (four) hours as needed for moderate pain.   Yes Historical Provider, MD  albuterol (PROVENTIL HFA;VENTOLIN HFA) 108 (90 BASE) MCG/ACT inhaler Inhale 2 puffs into the lungs every 4 (four) hours as needed for wheezing. 10/30/12  Yes Arthor Captain, PA-C  ferrous fumarate (HEMOCYTE - 106 MG FE) 325 (106 FE) MG TABS tablet Take 1 tablet by mouth every morning.    Yes Historical Provider, MD  furosemide (LASIX) 20 MG tablet Take 10 mg by mouth every morning.    Yes Historical Provider, MD  ibuprofen (ADVIL,MOTRIN) 800 MG tablet Take 800 mg by mouth every 8 (eight) hours as needed for moderate pain.   Yes Historical Provider, MD  insulin glargine (LANTUS) 100 UNIT/ML injection Inject 30 Units into the skin at bedtime.   Yes Historical Provider, MD  insulin regular (NOVOLIN R,HUMULIN R) 100  units/mL injection Inject 30 Units into the skin 3 (three) times daily before meals.    Yes Historical Provider, MD  lisinopril (PRINIVIL,ZESTRIL) 10 MG tablet Take 10 mg by mouth every morning.    Yes Historical Provider, MD  metFORMIN (GLUCOPHAGE) 500 MG tablet Take 500 mg by mouth 2 (two) times daily with a meal.   Yes Historical Provider, MD  methocarbamol (ROBAXIN) 750 MG tablet Take 1 tablet (750 mg total) by mouth 2 (two) times daily. 12/05/12  Yes Rudene Anda, PA-C  Multiple Vitamin (MULTIVITAMIN WITH MINERALS) TABS Take 1 tablet by mouth  every morning.    Yes Historical Provider, MD  simvastatin (ZOCOR) 40 MG tablet Take 40 mg by mouth at bedtime.   Yes Historical Provider, MD  Specialty Vitamins Products (VITAMINS FOR HAIR PO) Take 1 tablet by mouth every morning.    Yes Historical Provider, MD  vitamin C (ASCORBIC ACID) 500 MG tablet Take 1,000-2,000 mg by mouth once as needed (for cold).    Yes Historical Provider, MD   Allergies  Allergen Reactions  . Amoxicillin-Pot Clavulanate Diarrhea    FAMILY HISTORY:  Family History  Problem Relation Age of Onset  . Heart disease Father   . Diabetes type II      Family HX   SOCIAL HISTORY:  reports that she has never smoked. She has never used smokeless tobacco. She reports that she does not drink alcohol or use illicit drugs.  REVIEW OF SYSTEMS:   10 point review of system taken, please see HPI for positives and negatives.  SUBJECTIVE:   VITAL SIGNS: Temp:  [98.8 F (37.1 C)-99.5 F (37.5 C)] 98.8 F (37.1 C) (12/03 0526) Pulse Rate:  [96-114] 96 (12/03 0526) Resp:  [20-22] 22 (12/03 0526) BP: (107-131)/(42-81) 107/42 mmHg (12/03 0526) SpO2:  [95 %-98 %] 95 % (12/03 0526) HEMODYNAMICS:   VENTILATOR SETTINGS:   INTAKE / OUTPUT: Intake/Output     12/02 0701 - 12/03 0700 12/03 0701 - 12/04 0700   P.O. 1200 360   Total Intake(mL/kg) 1200 (7.6) 360 (2.3)   Urine (mL/kg/hr) 700 (0.2) 650 (1.1)   Total Output 700 650   Net +500 -290        Urine Occurrence 2 x      PHYSICAL EXAMINATION: General:  Awake and alert Neuro: No deficients  HEENT: Rt supraclavicular fullness, No JVD Cardiovascular:  HSR RRR Lungs: Decreased in bases rt >left Abdomen:obese +bs Musculoskeletal:  Intact Skin: 2+ le edema  LABS:  CBC  Recent Labs Lab 12/11/12 2120 12/11/12 2145 12/12/12 0553  WBC 20.2*  --  18.6*  HGB 11.2* 11.2* 10.6*  HCT 31.9* 33.0* 30.9*  PLT 406*  --  351   Coag's No results found for this basename: APTT, INR,  in the last 168  hours BMET  Recent Labs Lab 12/11/12 2145 12/12/12 0553  NA 145 140  K 3.9 3.8  CL 107 102  CO2  --  27  BUN 16 11  CREATININE 1.00 0.78  GLUCOSE 119* 213*   Electrolytes  Recent Labs Lab 12/12/12 0553  CALCIUM 9.2   Sepsis Markers No results found for this basename: LATICACIDVEN, PROCALCITON, O2SATVEN,  in the last 168 hours ABG No results found for this basename: PHART, PCO2ART, PO2ART,  in the last 168 hours Liver Enzymes  Recent Labs Lab 12/12/12 0553  AST 18  ALT 22  ALKPHOS 103  BILITOT 0.5  ALBUMIN 3.0*   Cardiac Enzymes No results found for  this basename: TROPONINI, PROBNP,  in the last 168 hours Glucose  Recent Labs Lab 12/12/12 0149 12/12/12 0601 12/12/12 1120 12/12/12 1653 12/12/12 2122 12/13/12 0641  GLUCAP 189* 198* 153* 129* 142* 198*    Imaging Dg Chest 2 View  12/11/2012   CLINICAL DATA:  Short of breath fever. 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  EXAM: CHEST  2 VIEW  COMPARISON:  Chest radiograph 12/05/2012, PET-CT 11/15/2012  FINDINGS: Mildly enlarged cardiac silhouette. Large right paramediastinal mass not changed. There is a small right effusion which is also similar. No focal consolidation. No pneumothorax.  IMPRESSION: 1. Stable large right mediastinal mass consistent with of thymoma 2. Stable small right effusion   Electronically Signed   By: Genevive Bi M.D.   On: 12/11/2012 18:30   Ct Angio Chest Pe W/cm &/or Wo Cm  12/11/2012   CLINICAL DATA:  Possible bronchitis  EXAM: CT ANGIOGRAPHY CHEST WITH CONTRAST  TECHNIQUE: Multidetector CT imaging of the chest was performed using the standard protocol during bolus administration of intravenous contrast. Multiplanar CT image reconstructions including MIPs were obtained to evaluate the vascular anatomy.  CONTRAST:  80mL OMNIPAQUE IOHEXOL 350 MG/ML SOLN  COMPARISON:  12/11/2012 chest radiograph, 11/15/2012 PET-CT  FINDINGS: Suboptimal contrast bolus timing. No  central pulmonary arterial filling defect. Mixing of contrast and non-opacified blood within the lobar and more peripheral branches limits evaluation.  Cardiomegaly. Trace pericardial fluid. Moderate right pleural effusion.  Continued enlargement of the right paramediastinal/anterior mediastinal mass, with margins inseparable from the right mediastinum and right upper lung. At the level of the previous measurement more superiorly, the mass measures 6.4 x 10.9 cm as compared to 6.5 x 10.2 cm however slightly inferiorly, the mass measures 8.4 x 9.0 cm as compared to 6.3 x 6.0 cm on the prior. Prominent mediastinal and right hilar lymph nodes. As index, subcarinal measuring 1.4 cm short axis.  Limited upper abdominal images show nothing acute.  Central airways are patent. No pneumothorax. Mild linear right lung opacities. Respiratory motion degrades detailed lung parenchymal evaluation.  Mild multilevel degenerative change.  No acute osseous finding.  Review of the MIP images confirms the above findings.  IMPRESSION: Suboptimal examination to evaluate for pulmonary embolism due to motion and bolus timing. Within this limitation; no large central filling defect. The lobar and more peripheral branches are not well evaluated and pulmonary embolism is not excluded.  Interval enlargement of the anterior mediastinal mass.  Moderate right pleural effusion with associated airspace opacity; atelectasis versus pneumonia, increased in the interval.   Electronically Signed   By: Jearld Lesch M.D.   On: 12/11/2012 23:32   Dg Chest Port 1 View  12/12/2012   CLINICAL DATA:  Pleural effusion.  EXAM: PORTABLE CHEST - 1 VIEW  COMPARISON:  CT 12/11/2012.  FINDINGS: Hemidiaphragms and lower lung fields are not imaged. Large mediastinal mass is present as noted on prior chest CT of 12/11/2012. Visualized mid and upper lungs are clear. Atelectasis and/or infiltrates in the lung bases cannot be excluded. Pleural effusions cannot be  excluded . Cardiomegaly. Normal pulmonary vascularity.  IMPRESSION: 1. Limited exam, the lung bases and hemidiaphragms are not imaged. No infiltrate noted in the mid or upper lungs. Atelectasis and/or infiltrates in the lung bases cannot be excluded. Pleural effusions cannot be excluded .  2.  Mediastinal mass is present as noted on CT of 12/11/2012.   Electronically Signed   By: Maisie Fus  Register   On: 12/12/2012 09:08  CXR: See above  ASSESSMENT / PLAN:  PULMONARY A:Rt pleural effusion with thymoma and lung mass. Rt effusion is small by [place] evaluation and is free flowing but is a high risk for pnx. Suggest effusion addressed during CVTS surgery. She needs more lasix and a flouroquinolone should suffice.  P:   -Evaluate for possbile thoracentesis - US performed, see note -CT neg PE (poor contrast delivery) but rt lower ext tender (check LEDS) -PCCM will be available if she becomes worse. 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 She was admitted 12-01 and treated with abx and diuresis. Note she is tender rt popliteal area and recent CT angio was of poor quality. She has a moderate rt effusion,enlarged rt para mediastinal mass on CT scan and PCCM asked to consult for possible thoracentesis. She further notes increased lower ext edema. She has been self medicating her DM in her room without staff aware.  No thora planned, clinically resolving with abx   I have fully  examined this patient and agree with above findings.    And edited infull  Mcarthur Rossetti. Tyson Alias, MD, FACP Pgr: [phone] [facility]  Brett Canales Minor ACNP Adolph Pollack PCCM Pager [phone] till 3 pm If no answer page [phone] 12/13/2012, 11:11 AM

## 2012-12-13 NOTE — Progress Notes (Signed)
ANTIBIOTIC CONSULT NOTE - FOLLOW UP  Pharmacy Consult for vancomycin Indication: rule out pneumonia  Allergies  Allergen Reactions  . Amoxicillin-Pot Clavulanate Diarrhea    Patient Measurements: Height: 5\' 9"  (175.3 cm) Weight: 350 lb 5 oz (158.9 kg) IBW/kg (Calculated) : 66.2 Adjusted Body Weight: 103.3kg  Vital Signs: Temp: 98.9 F (37.2 C) (12/03 1326) Temp src: Oral (12/03 1326) BP: 114/57 mmHg (12/03 1326) Pulse Rate: 95 (12/03 1326) Intake/Output from previous day: 12/02 0701 - 12/03 0700 In: 1200 [P.O.:1200] Out: 700 [Urine:700] Intake/Output from this shift:    Labs:  Recent Labs  12/11/12 2120 12/11/12 2145 12/12/12 0553  WBC 20.2*  --  18.6*  HGB 11.2* 11.2* 10.6*  PLT 406*  --  351  CREATININE  --  1.00 0.78   Estimated Creatinine Clearance: 144.8 ml/min (by C-G formula based on Cr of 0.78).  Recent Labs  12/13/12 1720  VANCOTROUGH 14.6     Microbiology: No results found for this or any previous visit (from the past 720 hour(s)).  Anti-infectives   Start     Dose/Rate Route Frequency Ordered Stop   12/12/12 0800  vancomycin (VANCOCIN) 1,250 mg in sodium chloride 0.9 % 250 mL IVPB     1,250 mg 166.7 mL/hr over 90 Minutes Intravenous Every 8 hours 12/12/12 0448     12/12/12 0800  piperacillin-tazobactam (ZOSYN) IVPB 3.375 g     3.375 g 12.5 mL/hr over 240 Minutes Intravenous Every 8 hours 12/12/12 0448     12/12/12 0000  vancomycin (VANCOCIN) IVPB 1000 mg/200 mL premix     1,000 mg 200 mL/hr over 60 Minutes Intravenous  Once 12/11/12 2353 12/12/12 0213   12/12/12 0000  piperacillin-tazobactam (ZOSYN) IVPB 3.375 g     3.375 g 12.5 mL/hr over 240 Minutes Intravenous  Once 12/11/12 2353 12/12/12 0655      Assessment: 47 YOF on vancomycin and Zosyn for r/o HCAP. Clinically improving per notes today. WBC decreased to 18.6 and Tmax is 99.5. A vancomycin trough drawn this evening was slightly low at 14.87mcg/mL, but with patient's weight and her  receiving a large dose, would expect more accumulation over time. Renal function stable.  Goal of Therapy:  Vancomycin trough level 15-20 mcg/ml  Plan:  1. Continue vancomycin 1250mg  IV q8h 2. Continue Zosyn 3.375gm IV q8h 3. Follow up ability to narrow to monotherapy Levaquin 4. Follow c/s, clinical progression, LOT  Shey Yott D. Ally Knodel, PharmD, BCPS Clinical Pharmacist Pager: [phone] 12/13/2012 7:11 PM

## 2012-12-13 NOTE — Progress Notes (Signed)
[address]       [name] [address]             [phone]         Procedure(s) (LRB): MEDIAN STERNOTOMY (N/A) RESECTION OF A THYMOMA (N/A) Subjective: Less SOB , intermittently productive cough  Objective    Temp:  [98.8 F (37.1 C)-99.5 F (37.5 C)] 98.8 F (37.1 C) (12/03 0526) Pulse Rate:  [96-114] 96 (12/03 0526) Resp:  [20-22] 22 (12/03 0526) BP: (107-131)/(42-81) 107/42 mmHg (12/03 0526) SpO2:  [95 %-98 %] 95 % (12/03 0526)   Intake/Output Summary (Last 24 hours) at 12/13/12 0738 Last data filed at 12/12/12 2125  Gross per 24 hour  Intake   1200 ml  Output    700 ml  Net    500 ml       General appearance: alert, cooperative and no distress Heart: regular rate and rhythm Lungs: dim in lower fields Abdomen: obese, soft, nontender Extremities: + edema  Lab Results:  Recent Labs  12/11/12 2145 12/12/12 0553  NA 145 140  K 3.9 3.8  CL 107 102  CO2  --  27  GLUCOSE 119* 213*  BUN 16 11  CREATININE 1.00 0.78  CALCIUM  --  9.2    Recent Labs  12/12/12 0553  AST 18  ALT 22  ALKPHOS 103  BILITOT 0.5  PROT 6.4  ALBUMIN 3.0*   No results found for this basename: LIPASE, AMYLASE,  in the last 72 hours  Recent Labs  12/11/12 2120 12/11/12 2145 12/12/12 0553  WBC 20.2*  --  18.6*  NEUTROABS  --   --  9.0*  HGB 11.2* 11.2* 10.6*  HCT 31.9* 33.0* 30.9*  MCV 74.4*  --  73.2*  PLT 406*  --  351   No results found for this basename: CKTOTAL, CKMB, TROPONINI,  in the last 72 hours No components found with this basename: POCBNP,  No results found for this basename: DDIMER,  in the last 72 hours No results found for this basename: HGBA1C,  in the last 72 hours No results found for this basename: CHOL, HDL, LDLCALC, TRIG, CHOLHDL,  in the last 72 hours  Recent Labs  12/12/12 0553  TSH 0.852   No results found for this basename: VITAMINB12, FOLATE, FERRITIN, TIBC, IRON, RETICCTPCT,  in the last 72  hours  Medications: Scheduled . benzonatate  200 mg Oral TID  . enoxaparin (LOVENOX) injection  40 mg Subcutaneous Q24H  . ferrous fumarate  1 tablet Oral q morning - 10a  . insulin aspart  0-9 Units Subcutaneous TID WC  . insulin glargine  30 Units Subcutaneous QHS  . lisinopril  10 mg Oral q morning - 10a  . multivitamin with minerals  1 tablet Oral q morning - 10a  . piperacillin-tazobactam (ZOSYN)  IV  3.375 g Intravenous Q8H  . simvastatin  40 mg Oral QHS  . sodium chloride  3 mL Intravenous Q12H  . vancomycin  1,250 mg Intravenous Q8H     Radiology/Studies:  Dg Chest 2 View  12/11/2012   CLINICAL DATA:  Short of breath fever. 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  EXAM: CHEST  2 VIEW  COMPARISON:  Chest radiograph 12/05/2012, PET-CT 11/15/2012  FINDINGS: Mildly enlarged cardiac silhouette. Large right paramediastinal mass not changed. There is a small right effusion which is also similar. No focal consolidation. No pneumothorax.  IMPRESSION: 1. 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 Stable small right effusion   Electronically Signed   By: Genevive Bi M.D.   On: 12/11/2012 18:30   Ct Angio Chest Pe W/cm &/or Wo Cm  12/11/2012   CLINICAL DATA:  Possible bronchitis  EXAM: CT ANGIOGRAPHY CHEST WITH CONTRAST  TECHNIQUE: Multidetector CT imaging of the chest was performed using the standard protocol during bolus administration of intravenous contrast. Multiplanar CT image reconstructions including MIPs were obtained to evaluate the vascular anatomy.  CONTRAST:  80mL OMNIPAQUE IOHEXOL 350 MG/ML SOLN  COMPARISON:  12/11/2012 chest radiograph, 11/15/2012 PET-CT  FINDINGS: Suboptimal contrast bolus timing. No central pulmonary arterial filling defect. Mixing of contrast and non-opacified blood within the lobar and more peripheral branches limits evaluation.  Cardiomegaly. Trace pericardial fluid. Moderate right pleural effusion.  Continued  enlargement of the right paramediastinal/anterior mediastinal mass, with margins inseparable from the right mediastinum and right upper lung. At the level of the previous measurement more superiorly, the mass measures 6.4 x 10.9 cm as compared to 6.5 x 10.2 cm however slightly inferiorly, the mass measures 8.4 x 9.0 cm as compared to 6.3 x 6.0 cm on the prior. Prominent mediastinal and right hilar lymph nodes. As index, subcarinal measuring 1.4 cm short axis.  Limited upper abdominal images show nothing acute.  Central airways are patent. No pneumothorax. Mild linear right lung opacities. Respiratory motion degrades detailed lung parenchymal evaluation.  Mild multilevel degenerative change.  No acute osseous finding.  Review of the MIP images confirms the above findings.  IMPRESSION: Suboptimal examination to evaluate for pulmonary embolism due to motion and bolus timing. Within this limitation; no large central filling defect. The lobar and more peripheral branches are not well evaluated and pulmonary embolism is not excluded.  Interval enlargement of the anterior mediastinal mass.  Moderate right pleural effusion with associated airspace opacity; atelectasis versus pneumonia, increased in the interval.   Electronically Signed   By: Jearld Lesch M.D.   On: 12/11/2012 23:32   Dg Chest Port 1 View  12/12/2012   CLINICAL DATA:  Pleural effusion.  EXAM: PORTABLE CHEST - 1 VIEW  COMPARISON:  CT 12/11/2012.  FINDINGS: Hemidiaphragms and lower lung fields are not imaged. Large mediastinal mass is present as noted on prior chest CT of 12/11/2012. Visualized mid and upper lungs are clear. Atelectasis and/or infiltrates in the lung bases cannot be excluded. Pleural effusions cannot be excluded . Cardiomegaly. Normal pulmonary vascularity.  IMPRESSION: 1. Limited exam, the lung bases and hemidiaphragms are not imaged. No infiltrate noted in the mid or upper lungs. Atelectasis and/or infiltrates in the lung bases cannot  be excluded. Pleural effusions cannot be excluded .  2.  Mediastinal mass is present as noted on CT of 12/11/2012.   Electronically Signed   By: Maisie Fus  Register   On: 12/12/2012 09:08    INR: Will add last result for INR, ABG once components are confirmed Will add last 4 CBG results once components are confirmed  Assessment/Plan: S/P Procedure(s) (LRB): MEDIAN STERNOTOMY (N/A) RESECTION OF A THYMOMA (N/A)  1 pulm status is clinically improving, no new chest xray, conts vanc/zosyn 2 no new labs 3 restart home insulin, she has been checking sugars and giving herself insulin that staff is unaware of    LOS: 2 days    Mackenzi Krogh E 12/3/20147:38 AM

## 2012-12-13 NOTE — Progress Notes (Signed)
Pt stated that her home insulin dose is 30 units of Novolog TID  and 30 units of Lantus at bedtime. Pt states she needs this to keep her blood sugar down. Pt shared with me that she has been checking her own blood sugar and giving herself her own insulin that she has in her purse. I advised her that we would have MD review insulin dosing and to not use home meds because we need to know what she has received.

## 2012-12-14 LAB — CBC
HCT: 30.2 % — ABNORMAL LOW (ref 36.0–46.0)
MCH: 25.4 pg — ABNORMAL LOW (ref 26.0–34.0)
MCHC: 34.1 g/dL (ref 30.0–36.0)
MCV: 74.6 fL — ABNORMAL LOW (ref 78.0–100.0)
Platelets: 476 10*3/uL — ABNORMAL HIGH (ref 150–400)
RDW: 16.6 % — ABNORMAL HIGH (ref 11.5–15.5)

## 2012-12-14 MED ORDER — LEVOFLOXACIN 750 MG PO TABS: 750.0000 mg | ORAL_TABLET | Freq: Every day | ORAL | Status: DC

## 2012-12-14 NOTE — Progress Notes (Addendum)
[address]       ,[place] [address]             [phone]         Procedure(s) (LRB): MEDIAN STERNOTOMY (N/A) RESECTION OF A THYMOMA (N/A) Subjective: Feels better, mild cough  Objective   Temp:  [98.3 F (36.8 C)-98.9 F (37.2 C)] 98.4 F (36.9 C) (12/04 0527) Pulse Rate:  [95-110] 102 (12/04 0527) Resp:  [20-24] 20 (12/04 0527) BP: (114-149)/(57-82) 121/70 mmHg (12/04 0527) SpO2:  [95 %-97 %] 96 % (12/04 0527)   Intake/Output Summary (Last 24 hours) at 12/14/12 0747 Last data filed at 12/13/12 1834  Gross per 24 hour  Intake   1270 ml  Output   1000 ml  Net    270 ml       General appearance: alert, cooperative and no distress Heart: regular rate and rhythm Lungs: mildly dim in bases Abdomen: benign Extremities: + LE edema Wound: N/A  Lab Results:  Recent Labs  12/11/12 2145 12/12/12 0553  NA 145 140  K 3.9 3.8  CL 107 102  CO2  --  27  GLUCOSE 119* 213*  BUN 16 11  CREATININE 1.00 0.78  CALCIUM  --  9.2    Recent Labs  12/12/12 0553  AST 18  ALT 22  ALKPHOS 103  BILITOT 0.5  PROT 6.4  ALBUMIN 3.0*   No results found for this basename: LIPASE, AMYLASE,  in the last 72 hours  Recent Labs  12/12/12 0553 12/14/12 0415  WBC 18.6* 18.2*  NEUTROABS 9.0*  --   HGB 10.6* 10.3*  HCT 30.9* 30.2*  MCV 73.2* 74.6*  PLT 351 476*   No results found for this basename: CKTOTAL, CKMB, TROPONINI,  in the last 72 hours No components found with this basename: POCBNP,  No results found for this basename: DDIMER,  in the last 72 hours No results found for this basename: HGBA1C,  in the last 72 hours No results found for this basename: CHOL, HDL, LDLCALC, TRIG, CHOLHDL,  in the last 72 hours  Recent Labs  12/12/12 0553  TSH 0.852   No results found for this basename: VITAMINB12, FOLATE, FERRITIN, TIBC, IRON, RETICCTPCT,  in the last 72 hours  Medications: Scheduled . benzonatate  200 mg Oral TID  . enoxaparin  (LOVENOX) injection  40 mg Subcutaneous Q24H  . ferrous fumarate  1 tablet Oral q morning - 10a  . insulin aspart  0-9 Units Subcutaneous TID WC  . insulin aspart  30 Units Subcutaneous TID WC  . insulin glargine  30 Units Subcutaneous QHS  . lisinopril  10 mg Oral q morning - 10a  . multivitamin with minerals  1 tablet Oral q morning - 10a  . piperacillin-tazobactam (ZOSYN)  IV  3.375 g Intravenous Q8H  . simvastatin  40 mg Oral QHS  . sodium chloride  3 mL Intravenous Q12H  . vancomycin  1,250 mg Intravenous Q8H     Radiology/Studies:  No results found.  INR: Will add last result for INR, ABG once components are confirmed Will add last 4 CBG results once components are confirmed  Assessment/Plan: S/P Procedure(s) (LRB): MEDIAN STERNOTOMY (N/A) RESECTION OF A THYMOMA (N/A)  1 stable with good clinical improvement 2 leukocytosis stable 3 conts management /ABX per primary svc      LOS: 3 days    GOLD,WAYNE E 12/4/20147:47 AM   Chart reviewed, patient examined, agree with above. She  feels better overall but still having right chest pain which I think is due to the large mediastinal tumor. She is to be discharged today. I will see her back next wed in the office to see how she is doing and will reschedule surgery at that time.

## 2012-12-14 NOTE — Discharge Summary (Signed)
[facility]                                                                                   Kaitlin [name], is a 47 y.o. female  DOB [birth year]  MRN [id number].  Admission date:  12/11/2012  Admitting Physician  Eduard Clos, MD  Discharge Date:  12/14/2012   Primary MD  Teodoro Spray, MD  Recommendations for primary care physician for things to follow:   Repeat CBC, BMP, 2 view chest x-ray in a week   Admission Diagnosis  HTN (hypertension) [401.9] HLD (hyperlipidemia) [272.4] Healthcare-associated pneumonia [486]  Discharge Diagnosis   HCAP  Principal Problem:   Pneumonia Active Problems:   Leukocytosis   Thymoma   Diabetes mellitus   HTN (hypertension)   HLD (hyperlipidemia)      Past Medical History  Diagnosis Date  . Diabetes mellitus   . High cholesterol   . Leukocytosis   . Anemia   . Obesity   . Sickle cell trait   . Hypertension     takes medicine to protect kidneys, does not have HTN  . Bronchitis   . Pneumonia     Past Surgical History  Procedure Laterality Date  . Tonsillectomy    . Tubal ligation    . Mediastinotomy chamberlain mcneil Right 11/06/2012    Procedure: MEDIASTINOTOMY CHAMBERLAIN MCNEILPROCEDURE;  Surgeon: Alleen Borne, MD;  Location: [facility] OR;  Service: Thoracic;  Laterality: Right;  . Lymph node biopsy Right 11/30/2012    Procedure: RIGHT TONSIL BIOPSY WITH FRESH FROZEN ANALYSIS;  Surgeon: Flo Shanks, MD;  Location: [facility] OR;  Service: ENT;  Laterality: Right;     Discharge Condition: stable   Follow-up Information   Follow up with Teodoro Spray, MD. Schedule an appointment as soon as possible for a visit in 1 week.   Specialty:  Internal Medicine   Contact information:   [address] [place] [address]       Follow up with Oretha Milch., MD. Schedule an appointment as soon as possible for a visit in 1 week.   Specialty:  Pulmonary Disease   Contact information:   [address] [place] [address] [phone]       Follow up with Alleen Borne, MD. Schedule an appointment as soon as possible for a visit in 1 week.   Specialty:  Cardiothoracic Surgery   Contact information:   [address] [place] [address] [phone]         Consults obtained - PCCM , cardiothoracic surgery   Discharge Medications      Medication List         acetaminophen 500 MG tablet  Commonly known as:  TYLENOL  Take 1,000 mg by mouth every 4 (four) hours as needed for moderate pain.     albuterol 108 (90 BASE) MCG/ACT inhaler  Commonly known as:  PROVENTIL HFA;VENTOLIN HFA  Inhale 2 puffs into the lungs every 4 (four) hours as needed for wheezing.     ferrous fumarate 325 (106 FE) MG Tabs tablet  Commonly known as:  HEMOCYTE - 106 mg FE  Take 1  tablet by mouth every morning.     furosemide 20 MG tablet  Commonly known as:  LASIX  Take 10 mg by mouth every morning.     ibuprofen 800 MG tablet  Commonly known as:  ADVIL,MOTRIN  Take 800 mg by mouth every 8 (eight) hours as needed for moderate pain.     insulin glargine 100 UNIT/ML injection  Commonly known as:  LANTUS  Inject 30 Units into the skin at bedtime.     insulin regular 100 units/mL injection  Commonly known as:  NOVOLIN R,HUMULIN R  Inject 30 Units into the skin 3 (three) times daily before meals.     levofloxacin 750 MG tablet  Commonly known as:  LEVAQUIN  Take 1 tablet (750 mg total) by mouth daily.     lisinopril 10 MG tablet  Commonly known as:  PRINIVIL,ZESTRIL  Take 10 mg by mouth every morning.     metFORMIN 500 MG tablet  Commonly known as:  GLUCOPHAGE  Take 500 mg by mouth 2 (two) times daily with a meal.     methocarbamol 750 MG tablet  Commonly known as:  ROBAXIN  Take 1 tablet (750 mg total) by mouth 2 (two) times daily.     multivitamin with minerals Tabs tablet  Take 1 tablet by mouth every morning.     simvastatin 40 MG tablet  Commonly known  as:  ZOCOR  Take 40 mg by mouth at bedtime.     vitamin C 500 MG tablet  Commonly known as:  ASCORBIC ACID  Take 1,000-2,000 mg by mouth once as needed (for cold).     VITAMINS FOR HAIR PO  Take 1 tablet by mouth every morning.         Diet and Activity recommendation: See Discharge Instructions below   Discharge Instructions     Follow with Primary MD Teodoro Spray, MD in 7 days   Get CBC, CMP, checked 7 days by Primary MD and again as instructed by your Primary MD. Get a 2 view Chest X ray done next visit.  Get Medicines reviewed and adjusted.  Please request your Prim.MD to go over all Hospital Tests and Procedure/Radiological results at the follow up, please get all Hospital records sent to your Prim MD by signing hospital release before you go home.  Activity: As tolerated with Full fall precautions use walker/cane & assistance as needed   Diet: Heart healthy - Low Carb  For Heart failure patients - Check your Weight same time everyday, if you gain over 2 pounds, or you develop in leg swelling, experience more shortness of breath or chest pain, call your Primary MD immediately. Follow Cardiac Low Salt Diet and 1.8 lit/day fluid restriction.  Disposition Home   If you experience worsening of your admission symptoms, develop shortness of breath, life threatening emergency, suicidal or homicidal thoughts you must seek medical attention immediately by calling 911 or calling your MD immediately  if symptoms less severe.  You Must read complete instructions/literature along with all the possible adverse reactions/side effects for all the Medicines you take and that have been prescribed to you. Take any new Medicines after you have completely understood and accpet all the possible adverse reactions/side effects.   Do not drive and provide baby sitting services if your were admitted for syncope or siezures until you have seen by Primary MD or a Neurologist and advised to do so  again.  Do not drive when taking Pain medications.  Do not take more than prescribed Pain, Sleep and Anxiety Medications  Special Instructions: If you have smoked or chewed Tobacco  in the last 2 yrs please stop smoking, stop any regular Alcohol  and or any Recreational drug use.  Wear Seat belts while driving.   Please note  You were cared for by a hospitalist during your hospital stay. If you have any questions about your discharge medications or the care you received while you were in the hospital after you are discharged, you can call the unit and asked to speak with the hospitalist on call if the hospitalist that took care of you is not available. Once you are discharged, your primary care physician will handle any further medical issues. Please note that NO REFILLS for any discharge medications will be authorized once you are discharged, as it is imperative that you return to your primary care physician (or establish a relationship with a primary care physician if you do not have one) for your aftercare needs so that they can reassess your need for medications and monitor your lab values.    Major procedures and Radiology Reports - PLEASE review detailed and final reports for all details, in brief -       Dg Chest 2 View  12/11/2012   CLINICAL DATA:  Short of breath fever. 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  EXAM: CHEST  2 VIEW  COMPARISON:  Chest radiograph 12/05/2012, PET-CT 11/15/2012  FINDINGS: Mildly enlarged cardiac silhouette. Large right paramediastinal mass not changed. There is a small right effusion which is also similar. No focal consolidation. No pneumothorax.  IMPRESSION: 1. Stable large right mediastinal mass consistent with of thymoma 2. Stable small right effusion   Electronically Signed   By: Genevive Bi M.D.   On: 12/11/2012 18:30   Dg Chest 2 View  12/05/2012   CLINICAL DATA:  Chest pain, arm pain, mediastinal mass, history of  biopsy proven thymoma  EXAM: CHEST  2 VIEW  COMPARISON:  11/15/2012, 11/29/2012  FINDINGS: Normal heart size and vascularity. Stable large right mediastinal mass. Slight increased right base atelectasis. No focal pneumonia, collapse or consolidation. No edema, pleural fluid, or pneumothorax. Trachea midline. Monitor leads overlie the chest.  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   On: 12/05/2012 17:57   Dg Chest 2 View  11/29/2012   CLINICAL DATA:  Followup mediastinal mass  EXAM: CHEST  2 VIEW  COMPARISON:  11/15/2012 PET scan, 11/10/2012 chest radiograph  FINDINGS: The heart size and vascular pattern are normal. No consolidation or effusion. Large right mediastinal mass present and unchanged in appearance when compared to prior study.  IMPRESSION: Large stable mediastinal   Electronically Signed   By: Esperanza Heir M.D.   On: 11/29/2012 13:19   Ct Head Wo Contrast  12/05/2012   CLINICAL DATA:  Pain.  EXAM: CT HEAD WITHOUT CONTRAST  TECHNIQUE: Contiguous axial images were obtained from the base of the skull through the vertex without intravenous contrast.  COMPARISON:  None.  FINDINGS: Nonenhanced CT reveals no evidence of mass lesion. No hydrocephalus. No hemorrhage. Orbits, paranasal sinuses, mastoids are clear. No acute bony abnormality.  IMPRESSION: No acute or focal abnormality.   Electronically Signed   By: Maisie Fus  Register   On: 12/05/2012 18:11   Ct Angio Chest Pe W/cm &/or Wo Cm  12/11/2012   CLINICAL DATA:  Possible bronchitis  EXAM: CT ANGIOGRAPHY CHEST WITH CONTRAST  TECHNIQUE: Multidetector CT imaging of the chest was performed using the standard protocol during bolus administration of intravenous contrast. Multiplanar CT image reconstructions including MIPs were obtained to evaluate the vascular anatomy.  CONTRAST:  80mL OMNIPAQUE IOHEXOL 350 MG/ML SOLN  COMPARISON:   12/11/2012 chest radiograph, 11/15/2012 PET-CT  FINDINGS: Suboptimal contrast bolus timing. No central pulmonary arterial filling defect. Mixing of contrast and non-opacified blood within the lobar and more peripheral branches limits evaluation.  Cardiomegaly. Trace pericardial fluid. Moderate right pleural effusion.  Continued enlargement of the right paramediastinal/anterior mediastinal mass, with margins inseparable from the right mediastinum and right upper lung. At the level of the previous measurement more superiorly, the mass measures 6.4 x 10.9 cm as compared to 6.5 x 10.2 cm however slightly inferiorly, the mass measures 8.4 x 9.0 cm as compared to 6.3 x 6.0 cm on the prior. Prominent mediastinal and right hilar lymph nodes. As index, subcarinal measuring 1.4 cm short axis.  Limited upper abdominal images show nothing acute.  Central airways are patent. No pneumothorax. Mild linear right lung opacities. Respiratory motion degrades detailed lung parenchymal evaluation.  Mild multilevel degenerative change.  No acute osseous finding.  Review of the MIP images confirms the above findings.  IMPRESSION: Suboptimal examination to evaluate for pulmonary embolism due to motion and bolus timing. Within this limitation; no large central filling defect. The lobar and more peripheral branches are not well evaluated and pulmonary embolism is not excluded.  Interval enlargement of the anterior mediastinal mass.  Moderate right pleural effusion with associated airspace opacity; atelectasis versus pneumonia, increased in the interval.   Electronically Signed   By: Jearld Lesch M.D.   On: 12/11/2012 23:32   Ct Cervical Spine Wo Contrast  12/05/2012   CLINICAL DATA:  Numbness.  Neck pain.  Headache.  EXAM: CT CERVICAL SPINE WITHOUT CONTRAST  TECHNIQUE: Multidetector CT imaging of the cervical spine was performed without intravenous contrast. Multiplanar CT image reconstructions were also generated.  COMPARISON:   None.  FINDINGS: No acute soft tissue or bony abnormality. Diffuse degenerative changes noted of the cervical spine. There is no evidence of fracture or dislocation. Lung apices are clear.  IMPRESSION: Diffuse degenerative change.  No acute abnormality.   Electronically Signed   By: Maisie Fus  Register   On: 12/05/2012 18:09   Nm Pet Image Initial (pi) Skull Base To Thigh  11/15/2012   CLINICAL DATA:  Initial treatment strategy for thymoma.  EXAM: NUCLEAR MEDICINE PET SKULL BASE TO THIGH  FASTING BLOOD GLUCOSE:  Value:  171 mg/dl  TECHNIQUE: 16.1 mCi W-96 FDG was injected intravenously. CT data was obtained and used for attenuation correction and anatomic localization only. (This was not acquired as a diagnostic CT examination.) Additional exam technical data entered on technologist worksheet.  COMPARISON:  CT chest dated 10/17/2012  FINDINGS: Note: The study is limited by heterogeneous FDG uptake due to a combination of body habitus, hyperglycemia, and streak artifact from the patient's arms.  NECK  Hypermetabolism in the posterior nasopharynx, symmetric, likely physiologic.  2.8 x 2.1 cm soft tissue lesion in the right oropharynx, adjacent to the right pontine tonsil (series 2/ image 30), max SUV 11.3.  No hypermetabolic lymph nodes in the neck. Scattered small bilateral cervical lymph nodes which do not meet pathologic CT size criteria.  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  11 mm ground-glass nodule in the right upper lobe (series 2/ image 80), new, favored to be infectious/inflammatory. Additional linear scarring in the anterior right upper lobe. Small right pleural effusion, new.  No hypermetabolic mediastinal or hilar nodes.  ABDOMEN/PELVIS  Heterogeneous hypermetabolism within the liver and spleen, without focal lesion.  No abnormal hypermetabolic activity within the pancreas and adrenal glands.   Heterogeneous GI uptake.  No hypermetabolic lymph nodes in the abdomen or pelvis.  SKELETON  Heterogeneous osseous uptake without suspicious focal lesion to suggest osseous metastasis.  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  11 mm ground-glass nodule in the right upper lobe, new, favored to be infectious/inflammatory. Small right pleural effusion, new.  No findings specific for metastatic disease.  2.8 cm soft tissue lesion in the right oropharynx, max SUV 11.3. ENT consultation with direct visualization is suggested to exclude a head/neck primary lesion.   Electronically Signed   By: Charline Bills M.D.   On: 11/15/2012 09:43   Dg Chest Port 1 View  12/12/2012   CLINICAL DATA:  Pleural effusion.  EXAM: PORTABLE CHEST - 1 VIEW  COMPARISON:  CT 12/11/2012.  FINDINGS: Hemidiaphragms and lower lung fields are not imaged. Large mediastinal mass is present as noted on prior chest CT of 12/11/2012. Visualized mid and upper lungs are clear. Atelectasis and/or infiltrates in the lung bases cannot be excluded. Pleural effusions cannot be excluded . Cardiomegaly. Normal pulmonary vascularity.  IMPRESSION: 1. Limited exam, the lung bases and hemidiaphragms are not imaged. No infiltrate noted in the mid or upper lungs. Atelectasis and/or infiltrates in the lung bases cannot be excluded. Pleural effusions cannot be excluded .  2.  Mediastinal mass is present as noted on CT of 12/11/2012.   Electronically Signed   By: Maisie Fus  Register   On: 12/12/2012 09:08    Bilateral lower extremity venous duplex. Unremarkable no DVT SVT.     Micro Results      No results found for this or any previous visit (from the past 240 hour(s)).   History of present illness and  Hospital Course:     Kindly see H&P for history of present illness and admission details, please review complete Labs, Consult reports and Test reports for all details in brief KAYTI POSS, is a 47 y.o. female, patient with history of thymoma, diabetes mellitus type 2 now insulin-dependent, morbid obesity, hypertension, dyslipidemia was admitted the hospital for HCAP causing cough, fever chills shortness of breath and right-sided pleuritic chest pain, blood cultures remain negative, chest x-ray suggestive of right lower lobe infiltrate with some minimal effusion.    She was kept on empiric IV antibiotics and seen by cardiothoracic surgery along with pulmonary critical care physicians, a bedside ultrasound was done and the fluid amount was not enough for thoracentesis , plan is to continue her on 7 more days of oral antibiotic, at that point repeat CBC, BMP and a 2 view chest x-ray. Will request her to follow one time with pulmonary critical care physician in case her effusion is larger it could be tapped at that time to rule out pneumonic versus malignant collection.   H/O thymoma. Patient was originally scheduled for surgery and resection on 12/16/2012 however this will be postponed due to her active infection, she will follow with cardiothoracic surgeon Dr. Lavinia Sharps in about  one-week time.   For her chronic medical problems which include hypertension, dyslipidemia, type 2 diabetes mellitus she will commence  her home medications unchanged post discharge.         Today   Subjective:   Valree Feild today has no headache,no chest abdominal pain,no new weakness tingling or numbness, feels much better wants to go home today.    Objective:   Blood pressure 121/70, pulse 102, temperature 98.4 F (36.9 C), temperature source Oral, resp. rate 20, height 5\' 9"  (1.753 m), weight 158.9 kg (350 lb 5 oz), last menstrual period 06/17/2012, SpO2 96.00%.   Intake/Output Summary (Last 24 hours) at 12/14/12 1230 Last data filed at 12/14/12 0730  Gross per 24 hour  Intake   1110 ml  Output   1300 ml  Net   -190 ml    Exam Awake Alert, Oriented *3, No new F.N deficits, Normal  affect [place].AT,PERRAL Supple Neck,No JVD, No cervical lymphadenopathy appriciated.  Symmetrical Chest wall movement, Good air movement bilaterally, CTAB RRR,No Gallops,Rubs or new Murmurs, No Parasternal Heave +ve B.Sounds, Abd Soft, Non tender, No organomegaly appriciated, No rebound -guarding or rigidity. No Cyanosis, Clubbing or edema, No new Rash or bruise  Data Review   CBC w Diff: Lab Results  Component Value Date   WBC 18.2* 12/14/2012   WBC 18.7* 03/29/2012   HGB 10.3* 12/14/2012   HGB 12.1 03/29/2012   HCT 30.2* 12/14/2012   HCT 36.7 03/29/2012   PLT 476* 12/14/2012   PLT 257 03/29/2012   LYMPHOPCT 47* 12/12/2012   LYMPHOPCT 54.7* 03/29/2012   MONOPCT 4 12/12/2012   MONOPCT 4.1 03/29/2012   EOSPCT 1 12/12/2012   EOSPCT 0.7 03/29/2012   BASOPCT 0 12/12/2012   BASOPCT 0.5 03/29/2012    CMP: Lab Results  Component Value Date   NA 140 12/12/2012   NA 141 03/29/2012   K 3.8 12/12/2012   K 4.1 03/29/2012   CL 102 12/12/2012   CL 108* 03/29/2012   CO2 27 12/12/2012   CO2 25 03/29/2012   BUN 11 12/12/2012   BUN 14.1 03/29/2012   CREATININE 0.78 12/12/2012   CREATININE 1.1 03/29/2012   PROT 6.4 12/12/2012   PROT 6.1* 03/29/2012   ALBUMIN 3.0* 12/12/2012   ALBUMIN 3.1* 03/29/2012   BILITOT 0.5 12/12/2012   BILITOT 0.23 03/29/2012   ALKPHOS 103 12/12/2012   ALKPHOS 90 03/29/2012   AST 18 12/12/2012   AST 23 03/29/2012   ALT 22 12/12/2012   ALT 31 03/29/2012  .   Total Time in preparing paper work, data evaluation and todays exam - 35 minutes  Leroy Sea M.D on 12/14/2012 at 12:30 PM  [facility] Office  [phone]

## 2012-12-14 NOTE — Progress Notes (Signed)
Inpatient Diabetes Program Recommendations  AACE/ADA: New Consensus Statement on Inpatient Glycemic Control (2013)  Target Ranges:  Prepandial:   less than 140 mg/dL      Peak postprandial:   less than 180 mg/dL (1-2 hours)      Critically ill patients:  140 - 180 mg/dL   Hypoglycemia yesterday evening: Results for Kaitlin, [name] (MRN [id number]) as of 12/14/2012 11:08  Ref. Range 12/13/2012 06:41 12/13/2012 11:30 12/13/2012 16:18 12/13/2012 21:19 12/13/2012 22:03 12/14/2012 06:08  Glucose-Capillary Latest Range: 70-99 mg/dL 478 (H) 295 (H) 621 (H) 59 (L) 123 (H) 112 (H)    CBG yesterday am started in 200's, thus the 30 units meal coverage slowly lowered her glucose throughout the day but eventually bottomed out by evening.  Today the fasting is controlled at 112 mg/dL.  The 30 units meal coverage tidwc may be too much while here. Recommend decreasing meal coverage to 15-30 units tidwc in addition to the correction as ordered.  Inpatient Diabetes Program Recommendations Insulin - Meal Coverage: May need to decrease to 20 units tidwc.  Thank you, Lenor Coffin, RN, CNS, Diabetes Coordinator [phone])

## 2012-12-15 ENCOUNTER — Other Ambulatory Visit: Payer: Self-pay | Admitting: *Deleted

## 2012-12-15 ENCOUNTER — Inpatient Hospital Stay (HOSPITAL_COMMUNITY): Admission: RE | Admit: 2012-12-15 | Payer: BC Managed Care – PPO | Source: Ambulatory Visit | Admitting: Surgery

## 2012-12-15 ENCOUNTER — Telehealth: Payer: Self-pay | Admitting: Pulmonary Disease

## 2012-12-15 DIAGNOSIS — J90 Pleural effusion, not elsewhere classified: Secondary | ICD-10-CM

## 2012-12-15 SURGERY — MEDIAN STERNOTOMY
Anesthesia: General

## 2012-12-15 NOTE — Telephone Encounter (Signed)
I called pt and appt scheduled with TP. Nothing further needed

## 2012-12-15 NOTE — Progress Notes (Signed)
Pt/family given discharge instructions, medication lists, follow up appointments, and when to call the doctor.  Pt/family verbalizes understanding. Kaitlin [name]    

## 2012-12-20 ENCOUNTER — Other Ambulatory Visit: Payer: Self-pay | Admitting: *Deleted

## 2012-12-20 ENCOUNTER — Encounter: Payer: Self-pay | Admitting: Surgery

## 2012-12-20 ENCOUNTER — Ambulatory Visit
Admission: RE | Admit: 2012-12-20 | Discharge: 2012-12-20 | Disposition: A | Discharge status: Home / Self Care | Payer: Medicaid Other | Source: Ambulatory Visit | Attending: Surgery | Admitting: Surgery

## 2012-12-20 ENCOUNTER — Ambulatory Visit (INDEPENDENT_AMBULATORY_CARE_PROVIDER_SITE_OTHER): Payer: BC Managed Care – PPO | Admitting: Surgery

## 2012-12-20 VITALS — BP 129/80 | HR 100 | Resp 20 | Ht 69.0 in | Wt 350.0 lb

## 2012-12-20 DIAGNOSIS — D4989 Neoplasm of unspecified behavior of other specified sites: Secondary | ICD-10-CM

## 2012-12-20 DIAGNOSIS — J90 Pleural effusion, not elsewhere classified: Secondary | ICD-10-CM

## 2012-12-20 DIAGNOSIS — D150 Benign neoplasm of thymus: Secondary | ICD-10-CM

## 2012-12-21 ENCOUNTER — Ambulatory Visit (INDEPENDENT_AMBULATORY_CARE_PROVIDER_SITE_OTHER)
Admission: RE | Admit: 2012-12-21 | Discharge: 2012-12-21 | Disposition: A | Discharge status: Home / Self Care | Payer: BC Managed Care – PPO | Source: Ambulatory Visit | Attending: Adult Health | Admitting: Adult Health

## 2012-12-21 ENCOUNTER — Encounter: Payer: Self-pay | Admitting: Adult Health

## 2012-12-21 ENCOUNTER — Encounter: Payer: Self-pay | Admitting: Surgery

## 2012-12-21 ENCOUNTER — Other Ambulatory Visit (INDEPENDENT_AMBULATORY_CARE_PROVIDER_SITE_OTHER): Payer: BC Managed Care – PPO

## 2012-12-21 ENCOUNTER — Ambulatory Visit (INDEPENDENT_AMBULATORY_CARE_PROVIDER_SITE_OTHER): Payer: BC Managed Care – PPO | Admitting: Adult Health

## 2012-12-21 VITALS — BP 112/64 | HR 111 | Temp 97.9°F | Ht 67.0 in | Wt 335.6 lb

## 2012-12-21 DIAGNOSIS — J189 Pneumonia, unspecified organism: Secondary | ICD-10-CM

## 2012-12-21 LAB — CBC WITH DIFFERENTIAL/PLATELET
Basophils Absolute: 0.1 10*3/uL (ref 0.0–0.1)
Basophils Relative: 0.4 % (ref 0.0–3.0)
Eosinophils Relative: 0.7 % (ref 0.0–5.0)
Lymphocytes Relative: 47.3 % — ABNORMAL HIGH (ref 12.0–46.0)
MCV: 76.3 fl — ABNORMAL LOW (ref 78.0–100.0)
Monocytes Absolute: 0.5 10*3/uL (ref 0.1–1.0)
Neutrophils Relative %: 48.9 % (ref 43.0–77.0)
Platelets: 568 10*3/uL — ABNORMAL HIGH (ref 150.0–400.0)
RBC: 4.75 Mil/uL (ref 3.87–5.11)
RDW: 18 % — ABNORMAL HIGH (ref 11.5–14.6)
WBC: 18.4 10*3/uL (ref 4.5–10.5)

## 2012-12-21 LAB — BASIC METABOLIC PANEL
BUN: 24 mg/dL — ABNORMAL HIGH (ref 6–23)
Chloride: 104 mEq/L (ref 96–112)
GFR: 65.49 mL/min (ref >60.00)
Glucose, Bld: 248 mg/dL — ABNORMAL HIGH (ref 70–99)
Potassium: 4.3 mEq/L (ref 3.5–5.1)
Sodium: 143 mEq/L (ref 135–145)

## 2012-12-21 NOTE — Progress Notes (Signed)
HPI: She returns today for follow-up after recent admission for pneumonia a few days before planned resection of a mediastinal thymoma.  She had a right mediastinotomy for biopsy on 10/27 and had a slow recovery from that. She was found to have some thickening in the tonsillar region with hypermetabolic activity on PET scan and underwent exam under anesthesia and removal of this tissue by Dr. Lazarus Salines on 11/20. Pathology was negative for malignancy. She developed a cough and shortness of breath and she had fever to 101.9. She went to see Dr. Lazarus Salines for followup and was sent to the ER. She had a Chest CT to rule out PE which was not a great study but did not show any PE. 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 There was a moderate right pleural effusion with associated airspace opacity that could be atelectasis or pneumonia. She has also been having right sided chest pain but that has been present for months. She has one more antibiotic pill to take to complete the course of oral antibiotics. She says she is feeling much better. She denies cough and sputum production. She has had no fever or chills. The right sided anterior chest pain is stable and not always present.    Current Outpatient Prescriptions  Medication Sig Dispense Refill  . acetaminophen (TYLENOL) 500 MG tablet Take 1,000 mg by mouth every 4 (four) hours as needed for moderate pain.      Marland Kitchen albuterol (PROVENTIL HFA;VENTOLIN HFA) 108 (90 BASE) MCG/ACT inhaler Inhale 2 puffs into the lungs every 4 (four) hours as needed for wheezing.  1 Inhaler  0  . ferrous fumarate (HEMOCYTE - 106 MG FE) 325 (106 FE) MG TABS tablet Take 1 tablet by mouth every morning.       . furosemide (LASIX) 20 MG tablet Take 10 mg by mouth every morning.       Marland Kitchen ibuprofen (ADVIL,MOTRIN) 800 MG tablet Take 800 mg by mouth every 8 (eight) hours as needed for moderate pain.      Marland Kitchen insulin glargine (LANTUS) 100 UNIT/ML injection Inject 30 Units into the skin  at bedtime.      . insulin regular (NOVOLIN R,HUMULIN R) 100 units/mL injection Inject 30 Units into the skin 3 (three) times daily before meals.       Marland Kitchen levofloxacin (LEVAQUIN) 750 MG tablet Take 1 tablet (750 mg total) by mouth daily.  7 tablet  0  . lisinopril (PRINIVIL,ZESTRIL) 10 MG tablet Take 10 mg by mouth every morning.       . metFORMIN (GLUCOPHAGE) 500 MG tablet Take 500 mg by mouth 2 (two) times daily with a meal.      . Multiple Vitamin (MULTIVITAMIN WITH MINERALS) TABS Take 1 tablet by mouth every morning.       . simvastatin (ZOCOR) 40 MG tablet Take 40 mg by mouth at bedtime.      Marland Kitchen Specialty Vitamins Products (VITAMINS FOR HAIR PO) Take 1 tablet by mouth every morning.       . vitamin C (ASCORBIC ACID) 500 MG tablet Take 1,000-2,000 mg by mouth once as needed (for cold).        No current facility-administered medications for this visit.     Physical Exam: BP 129/80  Pulse 100  Resp 20  Ht 5\' 9"  (1.753 m)  Wt 350 lb (158.759 kg)  BMI 51.66 kg/m2  SpO2 95%  LMP 06/17/2012 She looks well. Lung exam is clear. Cardiac exam shows  a regular rate and rhythm with normal heart sounds.   Diagnostic Tests:  CLINICAL DATA: History pleural effusion  EXAM:  CHEST 2 VIEW  COMPARISON: Chest radiograph 12/12/2012, CT 12/11/2012, chest  radiograph 12/05/2012  FINDINGS:  A right hilar mass is once again appreciated and appears stable. A  small right pleural effusion is identified. The lung volumes are  low. No further focal regions of consolidation or focal infiltrates.  The cardiac silhouette is otherwise enlarged. The osseous structures  unremarkable.  IMPRESSION:  Very small right pleural effusion. Stable right hilar mass.  Electronically Signed  By: Salome Holmes M.D.  On: 12/20/2012 15:31   Impression:  She is recovering well from her pneumonia. She would like to proceed with surgery shortly after Christmas.  Plan:  We will proceed with median sternotomy for  thymoma resection on 01/12/2013.

## 2012-12-21 NOTE — Progress Notes (Signed)
   Subjective:    Patient ID: Kaitlin [name], female    DOB: 05/31/1965, 47 y.o.   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 She was admitted 12-01 for CAP  and treated with abx. PCCM consulted on 12/3. CT showed a moderate rt effusion,enlarged rt para mediastinal mass on CT scan  . Bedside ultrasound was done and the fluid amount was not enough for thoracentesis Since discharge she is feeling some better  cxr today shows right hilar mass, minimal right basilar atx.  She denies any hemoptysis, orthopnea, PND, or increased leg swelling. Since discharge she feels breathing is doing well.  no new complaints today.  completed Levaquin today.    Review of Systems Constitutional:   No  weight loss, night sweats,  Fevers, chills,  +fatigue, or  lassitude.  HEENT:   No headaches,  Difficulty swallowing,  Tooth/dental problems, or  Sore throat,                No sneezing, itching, ear ache, nasal congestion, post nasal drip,   CV:  No chest pain,  Orthopnea, PND,  , anasarca, dizziness, palpitations, syncope.   GI  No heartburn, indigestion, abdominal pain, nausea, vomiting, diarrhea, change in bowel habits, loss of appetite, bloody stools.   Resp: No shortness of breath with exertion or at rest.  No excess mucus, no productive cough,  No non-productive cough,  No coughing up of blood.  No change in color of mucus.  No wheezing.  No chest wall deformity  Skin: no rash or lesions.  GU: no dysuria, change in color of urine, no urgency or frequency.  No flank pain, no hematuria   MS:  No joint pain or swelling.  No decreased range of motion.  No back pain.  Psych:  No change in mood or affect. No depression or anxiety.  No memory loss.         Objective:   Physical  Exam   GEN: A/Ox3; pleasant , NAD, obese   HEENT:  [name]/AT,  EACs-clear, TMs-wnl, NOSE-clear, THROAT-clear, no lesions, no postnasal drip or exudate noted.   NECK:  Supple w/ fair ROM; no JVD; normal carotid impulses w/o bruits; no thyromegaly or nodules palpated; no lymphadenopathy.  RESP  Diminished BS in bases , no accessory muscle use, no dullness to percussion  CARD:  RRR, no m/r/g  , tr  peripheral edema, pulses intact, no cyanosis or clubbing.  GI:   Soft & nt; nml bowel sounds; no organomegaly or masses detected.  Musco: Warm bil, no deformities or joint swelling noted.   Neuro: alert, no focal deficits noted.    Skin: Warm, no lesions or rashes        Assessment & Plan:

## 2012-12-21 NOTE — Patient Instructions (Signed)
Finish Levaquin today as planned  Labs today  Good luck on upcoming surgery May follow back at our office in 6-8 weeks with Dr. Vassie Loll  And As needed   Please contact office for sooner follow up if symptoms do not improve or worsen or seek emergency care

## 2012-12-26 NOTE — Assessment & Plan Note (Signed)
Clinically improving   Finish Levaquin today as planned  Labs today  Good luck on upcoming surgery May follow back at our office in 6-8 weeks with Dr. Vassie Loll  And As needed   Please contact office for sooner follow up if symptoms do not improve or worsen or seek emergency care

## 2013-01-09 ENCOUNTER — Encounter (HOSPITAL_COMMUNITY): Payer: Self-pay

## 2013-01-09 ENCOUNTER — Inpatient Hospital Stay (HOSPITAL_COMMUNITY)
Admission: RE | Admit: 2013-01-09 | Discharge: 2013-01-24 | DRG: 803 | Disposition: A | Discharge status: Home / Self Care | Payer: BC Managed Care – PPO | Source: Ambulatory Visit | Attending: Surgery | Admitting: Surgery

## 2013-01-09 ENCOUNTER — Ambulatory Visit (HOSPITAL_COMMUNITY)
Admission: RE | Admit: 2013-01-09 | Discharge: 2013-01-09 | Disposition: A | Discharge status: Home / Self Care | Payer: BC Managed Care – PPO | Source: Ambulatory Visit | Attending: Surgery | Admitting: Surgery

## 2013-01-09 VITALS — BP 138/64 | HR 108 | Temp 98.3°F | Resp 20 | Ht 69.0 in | Wt 341.3 lb

## 2013-01-09 DIAGNOSIS — D4989 Neoplasm of unspecified behavior of other specified sites: Secondary | ICD-10-CM

## 2013-01-09 DIAGNOSIS — I498 Other specified cardiac arrhythmias: Secondary | ICD-10-CM | POA: Diagnosis not present

## 2013-01-09 DIAGNOSIS — Z6841 Body Mass Index (BMI) 40.0 and over, adult: Secondary | ICD-10-CM

## 2013-01-09 DIAGNOSIS — I10 Essential (primary) hypertension: Secondary | ICD-10-CM | POA: Diagnosis present

## 2013-01-09 DIAGNOSIS — R609 Edema, unspecified: Secondary | ICD-10-CM | POA: Diagnosis present

## 2013-01-09 DIAGNOSIS — K5900 Constipation, unspecified: Secondary | ICD-10-CM | POA: Diagnosis not present

## 2013-01-09 DIAGNOSIS — D573 Sickle-cell trait: Secondary | ICD-10-CM | POA: Diagnosis present

## 2013-01-09 DIAGNOSIS — J40 Bronchitis, not specified as acute or chronic: Secondary | ICD-10-CM | POA: Diagnosis present

## 2013-01-09 DIAGNOSIS — E119 Type 2 diabetes mellitus without complications: Secondary | ICD-10-CM | POA: Diagnosis present

## 2013-01-09 DIAGNOSIS — R5381 Other malaise: Secondary | ICD-10-CM | POA: Diagnosis present

## 2013-01-09 DIAGNOSIS — D150 Benign neoplasm of thymus: Principal | ICD-10-CM | POA: Diagnosis present

## 2013-01-09 DIAGNOSIS — R000 Tachycardia, unspecified: Secondary | ICD-10-CM | POA: Diagnosis not present

## 2013-01-09 LAB — COMPREHENSIVE METABOLIC PANEL
ALT: 17 U/L (ref 0–35)
Albumin: 3.3 g/dL — ABNORMAL LOW (ref 3.5–5.2)
Calcium: 9.4 mg/dL (ref 8.4–10.5)
Creatinine, Ser: 0.95 mg/dL (ref 0.50–1.10)
GFR calc Af Amer: 83 mL/min — ABNORMAL LOW (ref >90)
GFR calc non Af Amer: 71 mL/min — ABNORMAL LOW (ref >90)
Glucose, Bld: 261 mg/dL — ABNORMAL HIGH (ref 70–99)
Sodium: 141 mEq/L (ref 137–147)
Total Protein: 6.4 g/dL (ref 6.0–8.3)

## 2013-01-09 LAB — CBC
Hemoglobin: 12.6 g/dL (ref 12.0–15.0)
MCH: 25.7 pg — ABNORMAL LOW (ref 26.0–34.0)
MCHC: 33.7 g/dL (ref 30.0–36.0)
MCV: 76.3 fL — ABNORMAL LOW (ref 78.0–100.0)

## 2013-01-09 LAB — URINALYSIS, ROUTINE W REFLEX MICROSCOPIC
Bilirubin Urine: NEGATIVE
Glucose, UA: NEGATIVE mg/dL
Ketones, ur: NEGATIVE mg/dL
Leukocytes, UA: NEGATIVE
Nitrite: NEGATIVE
Specific Gravity, Urine: 1.026 (ref 1.005–1.030)
Urobilinogen, UA: 1 mg/dL (ref 0.0–1.0)
pH: 5.5 (ref 5.0–8.0)

## 2013-01-09 LAB — BLOOD GAS, ARTERIAL
Acid-Base Excess: 0.2 mmol/L (ref 0.0–2.0)
Bicarbonate: 24.2 mEq/L — ABNORMAL HIGH (ref 20.0–24.0)
Drawn by: 344381
FIO2: 0.21 %
O2 Saturation: 97 %
Patient temperature: 98.6
TCO2: 25.4 mmol/L (ref 0–100)
pCO2 arterial: 38.3 mmHg (ref 35.0–45.0)
pO2, Arterial: 94.4 mmHg (ref 80.0–100.0)

## 2013-01-09 LAB — APTT: aPTT: 28 seconds (ref 24–37)

## 2013-01-09 LAB — PROTIME-INR
INR: 0.95 (ref 0.00–1.49)
Prothrombin Time: 12.5 seconds (ref 11.6–15.2)

## 2013-01-09 LAB — SURGICAL PCR SCREEN
MRSA, PCR: NEGATIVE
Staphylococcus aureus: NEGATIVE

## 2013-01-09 NOTE — Pre-Procedure Instructions (Signed)
Kaitlin [name]  01/09/2013   Your procedure is scheduled on:  01/12/13  Report to [facility]  2 * 3 at 530 AM.  Call this number if you have problems the morning of surgery: [phone]   Remember:   Do not eat food or drink liquids after midnight.   Take these medicines the morning of surgery with A SIP OF WATER: all inhalers   Do not wear jewelry, make-up or nail polish.  Do not wear lotions, powders, or perfumes. You may wear deodorant.  Do not shave 48 hours prior to surgery. Men may shave face and neck.  Do not bring valuables to the hospital.  [facility] is not responsible                  for any belongings or valuables.               Contacts, dentures or bridgework may not be worn into surgery.  Leave suitcase in the car. After surgery it may be brought to your room.  For patients admitted to the hospital, discharge time is determined by your                treatment team.               Patients discharged the day of surgery will not be allowed to drive  home.  Name and phone number of your driver: family  Special Instructions: Shower using CHG 2 nights before surgery and the night before surgery.  If you shower the day of surgery use CHG.  Use special wash - you have one bottle of CHG for all showers.  You should use approximately 1/3 of the bottle for each shower.   Please read over the following fact sheets that you were given: Pain Booklet, Coughing and Deep Breathing, Blood Transfusion Information, MRSA Information and Surgical Site Infection Prevention

## 2013-01-11 MED ORDER — VANCOMYCIN HCL 10 G IV SOLR
1500.0000 mg | INTRAVENOUS | Status: AC
  Administered 2013-01-12: 1500 mg via INTRAVENOUS
  Filled 2013-01-11 (×2): qty 1500

## 2013-01-12 ENCOUNTER — Inpatient Hospital Stay (HOSPITAL_COMMUNITY): Payer: BC Managed Care – PPO | Admitting: Anesthesiology

## 2013-01-12 ENCOUNTER — Encounter (HOSPITAL_COMMUNITY): Payer: BC Managed Care – PPO | Admitting: Anesthesiology

## 2013-01-12 ENCOUNTER — Encounter (HOSPITAL_COMMUNITY)
Admission: RE | Disposition: A | Discharge status: Home / Self Care | Payer: Self-pay | Source: Ambulatory Visit | Attending: Surgery

## 2013-01-12 ENCOUNTER — Encounter (HOSPITAL_COMMUNITY): Payer: Self-pay | Admitting: *Deleted

## 2013-01-12 ENCOUNTER — Inpatient Hospital Stay (HOSPITAL_COMMUNITY): Payer: BC Managed Care – PPO

## 2013-01-12 DIAGNOSIS — D150 Benign neoplasm of thymus: Secondary | ICD-10-CM

## 2013-01-12 HISTORY — PX: RESECTION OF A THYMOMA: SHX6214

## 2013-01-12 HISTORY — PX: MEDIASTERNOTOMY: SHX5084

## 2013-01-12 LAB — PROTIME-INR
INR: 1.07 (ref 0.00–1.49)
Prothrombin Time: 13.7 seconds (ref 11.6–15.2)

## 2013-01-12 LAB — POCT I-STAT, CHEM 8
BUN: 15 mg/dL (ref 6–23)
CALCIUM ION: 1.21 mmol/L (ref 1.12–1.23)
Chloride: 105 mEq/L (ref 96–112)
Creatinine, Ser: 0.9 mg/dL (ref 0.50–1.10)
Glucose, Bld: 261 mg/dL — ABNORMAL HIGH (ref 70–99)
HEMATOCRIT: 39 % (ref 36.0–46.0)
HEMOGLOBIN: 13.3 g/dL (ref 12.0–15.0)
Potassium: 4.5 mEq/L (ref 3.7–5.3)
Sodium: 138 mEq/L (ref 137–147)
TCO2: 23 mmol/L (ref 0–100)

## 2013-01-12 LAB — POCT I-STAT 3, ART BLOOD GAS (G3+)
ACID-BASE DEFICIT: 1 mmol/L (ref 0.0–2.0)
Acid-base deficit: 2 mmol/L (ref 0.0–2.0)
Acid-base deficit: 2 mmol/L (ref 0.0–2.0)
Acid-base deficit: 3 mmol/L — ABNORMAL HIGH (ref 0.0–2.0)
BICARBONATE: 25.8 meq/L — AB (ref 20.0–24.0)
Bicarbonate: 26 mEq/L — ABNORMAL HIGH (ref 20.0–24.0)
Bicarbonate: 24.7 mEq/L — ABNORMAL HIGH (ref 20.0–24.0)
Bicarbonate: 22.8 mEq/L (ref 20.0–24.0)
O2 SAT: 98 %
O2 Saturation: 95 %
O2 Saturation: 95 %
O2 Saturation: 97 %
PCO2 ART: 55.1 mmHg — AB (ref 35.0–45.0)
PCO2 ART: 41.4 mmHg (ref 35.0–45.0)
PH ART: 7.28 — AB (ref 7.350–7.450)
PH ART: 7.347 — AB (ref 7.350–7.450)
Patient temperature: 97.5
TCO2: 27 mmol/L (ref 0–100)
TCO2: 28 mmol/L (ref 0–100)
TCO2: 26 mmol/L (ref 0–100)
TCO2: 24 mmol/L (ref 0–100)
pCO2 arterial: 54.4 mmHg — ABNORMAL HIGH (ref 35.0–45.0)
pCO2 arterial: 46.6 mmHg — ABNORMAL HIGH (ref 35.0–45.0)
pH, Arterial: 7.279 — ABNORMAL LOW (ref 7.350–7.450)
pH, Arterial: 7.333 — ABNORMAL LOW (ref 7.350–7.450)
pO2, Arterial: 84 mmHg (ref 80.0–100.0)
pO2, Arterial: 86 mmHg (ref 80.0–100.0)
pO2, Arterial: 123 mmHg — ABNORMAL HIGH (ref 80.0–100.0)
pO2, Arterial: 93 mmHg (ref 80.0–100.0)

## 2013-01-12 LAB — CBC
HEMATOCRIT: 35.6 % — AB (ref 36.0–46.0)
HEMOGLOBIN: 12.2 g/dL (ref 12.0–15.0)
MCH: 25.7 pg — ABNORMAL LOW (ref 26.0–34.0)
MCHC: 34.3 g/dL (ref 30.0–36.0)
MCV: 75.1 fL — AB (ref 78.0–100.0)
Platelets: 271 10*3/uL (ref 150–400)
RBC: 4.74 MIL/uL (ref 3.87–5.11)
RDW: 16.3 % — AB (ref 11.5–15.5)
WBC: 19.3 10*3/uL — AB (ref 4.0–10.5)

## 2013-01-12 LAB — BASIC METABOLIC PANEL
BUN: 17 mg/dL (ref 6–23)
CO2: 25 mEq/L (ref 19–32)
Calcium: 8.7 mg/dL (ref 8.4–10.5)
Chloride: 103 mEq/L (ref 96–112)
Creatinine, Ser: 0.77 mg/dL (ref 0.50–1.10)
Glucose, Bld: 359 mg/dL — ABNORMAL HIGH (ref 70–99)
Potassium: 5.1 mEq/L (ref 3.7–5.3)
SODIUM: 140 meq/L (ref 137–147)

## 2013-01-12 LAB — POCT I-STAT 4, (NA,K, GLUC, HGB,HCT)
GLUCOSE: 352 mg/dL — AB (ref 70–99)
Glucose, Bld: 288 mg/dL — ABNORMAL HIGH (ref 70–99)
HCT: 34 % — ABNORMAL LOW (ref 36.0–46.0)
HCT: 50 % — ABNORMAL HIGH (ref 36.0–46.0)
HEMOGLOBIN: 17 g/dL — AB (ref 12.0–15.0)
Hemoglobin: 11.6 g/dL — ABNORMAL LOW (ref 12.0–15.0)
Potassium: 4.7 mEq/L (ref 3.7–5.3)
Potassium: 4.7 mEq/L (ref 3.7–5.3)
Sodium: 139 mEq/L (ref 137–147)
Sodium: 135 mEq/L — ABNORMAL LOW (ref 137–147)

## 2013-01-12 LAB — APTT: APTT: 29 s (ref 24–37)

## 2013-01-12 LAB — GLUCOSE, CAPILLARY
GLUCOSE-CAPILLARY: 109 mg/dL — AB (ref 70–99)
Glucose-Capillary: 225 mg/dL — ABNORMAL HIGH (ref 70–99)

## 2013-01-12 SURGERY — MEDIAN STERNOTOMY
Anesthesia: General

## 2013-01-12 MED ORDER — ACETAMINOPHEN 160 MG/5ML PO SOLN
1000.0000 mg | Freq: Four times a day (QID) | ORAL | Status: DC
  Administered 2013-01-13 – 2013-01-14 (×3): 1000 mg
  Filled 2013-01-12: qty 40.6

## 2013-01-12 MED ORDER — ARTIFICIAL TEARS OP OINT
TOPICAL_OINTMENT | OPHTHALMIC | Status: DC | PRN
  Administered 2013-01-12: 1 via OPHTHALMIC

## 2013-01-12 MED ORDER — VANCOMYCIN HCL IN DEXTROSE 1-5 GM/200ML-% IV SOLN
1000.0000 mg | Freq: Once | INTRAVENOUS | Status: AC
  Administered 2013-01-12: 1000 mg via INTRAVENOUS
  Filled 2013-01-12: qty 200

## 2013-01-12 MED ORDER — POTASSIUM CHLORIDE 10 MEQ/50ML IV SOLN: 10.0000 meq | INTRAVENOUS | Status: AC

## 2013-01-12 MED ORDER — SODIUM CHLORIDE 0.9 % IJ SOLN
3.0000 mL | Freq: Two times a day (BID) | INTRAMUSCULAR | Status: DC
  Administered 2013-01-13 – 2013-01-16 (×6): 3 mL via INTRAVENOUS

## 2013-01-12 MED ORDER — FENTANYL CITRATE 0.05 MG/ML IJ SOLN
INTRAMUSCULAR | Status: DC | PRN
  Administered 2013-01-12 (×7): 50 ug via INTRAVENOUS
  Administered 2013-01-12: 200 ug via INTRAVENOUS
  Administered 2013-01-12 (×2): 50 ug via INTRAVENOUS

## 2013-01-12 MED ORDER — ALBUMIN HUMAN 5 % IV SOLN
250.0000 mL | INTRAVENOUS | Status: AC | PRN
  Administered 2013-01-12 – 2013-01-13 (×4): 250 mL via INTRAVENOUS

## 2013-01-12 MED ORDER — LACTATED RINGERS IV SOLN
INTRAVENOUS | Status: DC | PRN
  Administered 2013-01-12 (×5): via INTRAVENOUS

## 2013-01-12 MED ORDER — ROCURONIUM BROMIDE 100 MG/10ML IV SOLN: INTRAVENOUS | Status: DC | PRN

## 2013-01-12 MED ORDER — SODIUM CHLORIDE 0.9 % IJ SOLN: 3.0000 mL | INTRAMUSCULAR | Status: DC | PRN

## 2013-01-12 MED ORDER — MIDAZOLAM HCL 5 MG/5ML IJ SOLN
INTRAMUSCULAR | Status: DC | PRN
Start: 2013-01-12 — End: 2013-01-12
  Administered 2013-01-12: 2 mg via INTRAVENOUS
  Administered 2013-01-12 (×2): 1 mg via INTRAVENOUS
  Administered 2013-01-12 (×2): 2 mg via INTRAVENOUS

## 2013-01-12 MED ORDER — ONDANSETRON HCL 4 MG/2ML IJ SOLN
4.0000 mg | Freq: Four times a day (QID) | INTRAMUSCULAR | Status: DC | PRN
Start: 2013-01-12 — End: 2013-01-17
  Administered 2013-01-14 (×2): 4 mg via INTRAVENOUS
  Filled 2013-01-12 (×2): qty 2

## 2013-01-12 MED ORDER — LEVALBUTEROL HCL 0.63 MG/3ML IN NEBU
0.6300 mg | INHALATION_SOLUTION | Freq: Four times a day (QID) | RESPIRATORY_TRACT | Status: DC
  Administered 2013-01-12: 0.63 mg via RESPIRATORY_TRACT
  Filled 2013-01-12: qty 3

## 2013-01-12 MED ORDER — METOPROLOL TARTRATE 25 MG/10 ML ORAL SUSPENSION
12.5000 mg | Freq: Two times a day (BID) | ORAL | Status: DC
  Filled 2013-01-12 (×11): qty 5

## 2013-01-12 MED ORDER — BISACODYL 5 MG PO TBEC
10.0000 mg | DELAYED_RELEASE_TABLET | Freq: Every day | ORAL | Status: DC
  Administered 2013-01-13 – 2013-01-17 (×5): 10 mg via ORAL
  Filled 2013-01-12 (×5): qty 2

## 2013-01-12 MED ORDER — NITROGLYCERIN IN D5W 200-5 MCG/ML-% IV SOLN: 0.0000 ug/min | INTRAVENOUS | Status: DC

## 2013-01-12 MED ORDER — SODIUM CHLORIDE 0.45 % IV SOLN
INTRAVENOUS | Status: DC
  Administered 2013-01-12 – 2013-01-13 (×2): via INTRAVENOUS

## 2013-01-12 MED ORDER — SODIUM CHLORIDE 0.9 % IJ SOLN
OROMUCOSAL | Status: DC | PRN
  Administered 2013-01-12 (×4): via TOPICAL

## 2013-01-12 MED ORDER — METOPROLOL TARTRATE 12.5 MG HALF TABLET
12.5000 mg | ORAL_TABLET | Freq: Two times a day (BID) | ORAL | Status: DC
  Administered 2013-01-15 – 2013-01-17 (×4): 12.5 mg via ORAL
  Filled 2013-01-12 (×11): qty 1

## 2013-01-12 MED ORDER — LEVALBUTEROL HCL 0.63 MG/3ML IN NEBU
0.6300 mg | INHALATION_SOLUTION | Freq: Four times a day (QID) | RESPIRATORY_TRACT | Status: DC | PRN
  Administered 2013-01-15 – 2013-01-18 (×2): 0.63 mg via RESPIRATORY_TRACT
  Filled 2013-01-12 (×4): qty 3

## 2013-01-12 MED ORDER — LEVOFLOXACIN IN D5W 750 MG/150ML IV SOLN
750.0000 mg | Freq: Once | INTRAVENOUS | Status: AC
  Administered 2013-01-12: 750 mg via INTRAVENOUS
  Filled 2013-01-12: qty 150

## 2013-01-12 MED ORDER — MIDAZOLAM HCL 2 MG/2ML IJ SOLN
2.0000 mg | INTRAMUSCULAR | Status: DC | PRN
  Administered 2013-01-12 (×2): 2 mg via INTRAVENOUS
  Filled 2013-01-12: qty 2

## 2013-01-12 MED ORDER — INSULIN REGULAR BOLUS VIA INFUSION
0.0000 [IU] | Freq: Three times a day (TID) | INTRAVENOUS | Status: DC
  Filled 2013-01-12: qty 10

## 2013-01-12 MED ORDER — LACTATED RINGERS IV SOLN: INTRAVENOUS | Status: DC

## 2013-01-12 MED ORDER — PHENYLEPHRINE HCL 10 MG/ML IJ SOLN
10.0000 mg | INTRAVENOUS | Status: DC | PRN
  Administered 2013-01-12: 25 ug/min via INTRAVENOUS

## 2013-01-12 MED ORDER — ACETAMINOPHEN 650 MG RE SUPP: 650.0000 mg | Freq: Once | RECTAL | Status: AC

## 2013-01-12 MED ORDER — KETOROLAC TROMETHAMINE 15 MG/ML IJ SOLN
15.0000 mg | Freq: Four times a day (QID) | INTRAMUSCULAR | Status: AC | PRN
  Administered 2013-01-12 – 2013-01-17 (×14): 15 mg via INTRAVENOUS
  Filled 2013-01-12 (×17): qty 1

## 2013-01-12 MED ORDER — PANTOPRAZOLE SODIUM 40 MG PO TBEC
40.0000 mg | DELAYED_RELEASE_TABLET | Freq: Every day | ORAL | Status: DC
  Administered 2013-01-14: 40 mg via ORAL
  Filled 2013-01-12: qty 1

## 2013-01-12 MED ORDER — BISACODYL 10 MG RE SUPP: 10.0000 mg | Freq: Every day | RECTAL | Status: DC

## 2013-01-12 MED ORDER — FAMOTIDINE IN NACL 20-0.9 MG/50ML-% IV SOLN
20.0000 mg | Freq: Two times a day (BID) | INTRAVENOUS | Status: AC
  Administered 2013-01-12: 20 mg via INTRAVENOUS
  Filled 2013-01-12: qty 50

## 2013-01-12 MED ORDER — DEXMEDETOMIDINE HCL IN NACL 200 MCG/50ML IV SOLN
0.1000 ug/kg/h | INTRAVENOUS | Status: DC
  Administered 2013-01-12: 0.7 ug/kg/h via INTRAVENOUS
  Administered 2013-01-12: 0.3 ug/kg/h via INTRAVENOUS
  Administered 2013-01-12: 0.7 ug/kg/h via INTRAVENOUS
  Administered 2013-01-13: 0.3 ug/kg/h via INTRAVENOUS
  Filled 2013-01-12: qty 100
  Filled 2013-01-12 (×3): qty 50

## 2013-01-12 MED ORDER — ACETAMINOPHEN 160 MG/5ML PO SOLN
650.0000 mg | Freq: Once | ORAL | Status: AC
  Administered 2013-01-12: 650 mg

## 2013-01-12 MED ORDER — MORPHINE SULFATE 2 MG/ML IJ SOLN
2.0000 mg | INTRAMUSCULAR | Status: DC | PRN
  Administered 2013-01-12: 2 mg via INTRAVENOUS
  Administered 2013-01-13 (×4): 4 mg via INTRAVENOUS
  Administered 2013-01-13: 2 mg via INTRAVENOUS
  Administered 2013-01-13 (×2): 4 mg via INTRAVENOUS
  Administered 2013-01-14: 2 mg via INTRAVENOUS
  Administered 2013-01-14: 4 mg via INTRAVENOUS
  Administered 2013-01-14: 2 mg via INTRAVENOUS
  Administered 2013-01-14 (×2): 4 mg via INTRAVENOUS
  Administered 2013-01-14: 2 mg via INTRAVENOUS
  Administered 2013-01-15: 4 mg via INTRAVENOUS
  Administered 2013-01-15 (×2): 2 mg via INTRAVENOUS
  Filled 2013-01-12: qty 1
  Filled 2013-01-12 (×3): qty 2
  Filled 2013-01-12 (×3): qty 1
  Filled 2013-01-12: qty 2
  Filled 2013-01-12 (×2): qty 1
  Filled 2013-01-12 (×2): qty 2
  Filled 2013-01-12: qty 1
  Filled 2013-01-12 (×5): qty 2
  Filled 2013-01-12: qty 1
  Filled 2013-01-12: qty 2

## 2013-01-12 MED ORDER — SODIUM CHLORIDE 0.9 % IV SOLN: 250.0000 mL | INTRAVENOUS | Status: DC

## 2013-01-12 MED ORDER — PROPOFOL INFUSION 10 MG/ML OPTIME
INTRAVENOUS | Status: DC | PRN
  Administered 2013-01-12: 50 ug/kg/min via INTRAVENOUS

## 2013-01-12 MED ORDER — PHENYLEPHRINE HCL 10 MG/ML IJ SOLN
0.0000 ug/min | INTRAVENOUS | Status: DC
  Administered 2013-01-12: 40 ug/min via INTRAVENOUS
  Administered 2013-01-13: 50 ug/min via INTRAVENOUS
  Administered 2013-01-13: 65 ug/min via INTRAVENOUS
  Filled 2013-01-12 (×6): qty 2

## 2013-01-12 MED ORDER — INSULIN ASPART 100 UNIT/ML ~~LOC~~ SOLN
20.0000 [IU] | Freq: Once | SUBCUTANEOUS | Status: AC
  Administered 2013-01-12: 20 [IU] via SUBCUTANEOUS

## 2013-01-12 MED ORDER — SODIUM CHLORIDE 0.9 % IV SOLN
INTRAVENOUS | Status: DC
  Administered 2013-01-12: 5.8 [IU]/h via INTRAVENOUS
  Administered 2013-01-13: 4.7 [IU]/h via INTRAVENOUS
  Filled 2013-01-12 (×3): qty 1

## 2013-01-12 MED ORDER — DEXTROSE 5 % IV SOLN
1.5000 g | Freq: Two times a day (BID) | INTRAVENOUS | Status: AC
  Administered 2013-01-12 – 2013-01-14 (×4): 1.5 g via INTRAVENOUS
  Filled 2013-01-12 (×4): qty 1.5

## 2013-01-12 MED ORDER — 0.9 % SODIUM CHLORIDE (POUR BTL) OPTIME
TOPICAL | Status: DC | PRN
  Administered 2013-01-12: 1000 mL

## 2013-01-12 MED ORDER — OXYCODONE HCL 5 MG PO TABS
5.0000 mg | ORAL_TABLET | ORAL | Status: DC | PRN
  Administered 2013-01-13 (×2): 5 mg via ORAL
  Administered 2013-01-13 – 2013-01-17 (×17): 10 mg via ORAL
  Filled 2013-01-12 (×13): qty 2
  Filled 2013-01-12 (×2): qty 1
  Filled 2013-01-12 (×3): qty 2

## 2013-01-12 MED ORDER — METOPROLOL TARTRATE 1 MG/ML IV SOLN: 2.5000 mg | INTRAVENOUS | Status: DC | PRN

## 2013-01-12 MED ORDER — DOCUSATE SODIUM 100 MG PO CAPS
200.0000 mg | ORAL_CAPSULE | Freq: Every day | ORAL | Status: DC
  Administered 2013-01-13 – 2013-01-17 (×5): 200 mg via ORAL
  Filled 2013-01-12 (×5): qty 2

## 2013-01-12 MED ORDER — ACETAMINOPHEN 500 MG PO TABS
1000.0000 mg | ORAL_TABLET | Freq: Four times a day (QID) | ORAL | Status: DC
  Administered 2013-01-13 – 2013-01-14 (×4): 1000 mg via ORAL
  Administered 2013-01-15: 500 mg via ORAL
  Administered 2013-01-15 – 2013-01-17 (×10): 1000 mg via ORAL
  Filled 2013-01-12 (×20): qty 2

## 2013-01-12 MED ORDER — SUCCINYLCHOLINE CHLORIDE 20 MG/ML IJ SOLN
INTRAMUSCULAR | Status: DC | PRN
  Administered 2013-01-12: 120 mg via INTRAVENOUS

## 2013-01-12 MED ORDER — MIDAZOLAM HCL 2 MG/2ML IJ SOLN
INTRAMUSCULAR | Status: AC
  Administered 2013-01-12: 2 mg via INTRAVENOUS
  Filled 2013-01-12: qty 2

## 2013-01-12 MED ORDER — PROPOFOL 10 MG/ML IV BOLUS
INTRAVENOUS | Status: DC | PRN
  Administered 2013-01-12: 190 mg via INTRAVENOUS

## 2013-01-12 MED ORDER — ALBUMIN HUMAN 5 % IV SOLN
INTRAVENOUS | Status: DC | PRN
Start: 2013-01-12 — End: 2013-01-12
  Administered 2013-01-12: 10:00:00 via INTRAVENOUS

## 2013-01-12 MED ORDER — MORPHINE SULFATE 2 MG/ML IJ SOLN
1.0000 mg | INTRAMUSCULAR | Status: AC | PRN
  Administered 2013-01-12 (×2): 2 mg via INTRAVENOUS
  Administered 2013-01-12: 4 mg via INTRAVENOUS
  Administered 2013-01-12 (×3): 2 mg via INTRAVENOUS
  Filled 2013-01-12: qty 1

## 2013-01-12 MED ORDER — ROCURONIUM BROMIDE 100 MG/10ML IV SOLN
INTRAVENOUS | Status: DC | PRN
  Administered 2013-01-12 (×2): 10 mg via INTRAVENOUS
  Administered 2013-01-12: 60 mg via INTRAVENOUS
  Administered 2013-01-12 (×8): 10 mg via INTRAVENOUS
  Administered 2013-01-12: 20 mg via INTRAVENOUS

## 2013-01-12 MED ORDER — MORPHINE SULFATE 2 MG/ML IJ SOLN
INTRAMUSCULAR | Status: AC
  Administered 2013-01-12: 2 mg via INTRAVENOUS
  Filled 2013-01-12: qty 1

## 2013-01-12 SURGICAL SUPPLY — 52 items
BINDER BREAST 3XL (BIND) ×1 IMPLANT
BLADE OSCILLATING /SAGITTAL (BLADE) ×1 IMPLANT
BLADE SURG 11 STRL SS (BLADE) IMPLANT
CANISTER SUCTION 2500CC (MISCELLANEOUS) ×2 IMPLANT
CATH THORACIC 28FR (CATHETERS) ×2 IMPLANT
CATH THORACIC 28FR RT ANG (CATHETERS) IMPLANT
CATH THORACIC 36FR (CATHETERS) ×1 IMPLANT
CATH THORACIC 36FR RT ANG (CATHETERS) ×1 IMPLANT
CLIP TI MEDIUM 24 (CLIP) ×2 IMPLANT
CLIP TI WIDE RED SMALL 24 (CLIP) ×2 IMPLANT
CONN Y 3/8X3/8X3/8  BEN (MISCELLANEOUS) ×2
CONN Y 3/8X3/8X3/8 BEN (MISCELLANEOUS) IMPLANT
CONT SPEC 4OZ CLIKSEAL STRL BL (MISCELLANEOUS) ×2 IMPLANT
COVER SURGICAL LIGHT HANDLE (MISCELLANEOUS) ×4 IMPLANT
DRAPE LAPAROSCOPIC ABDOMINAL (DRAPES) ×2 IMPLANT
DRSG COVADERM 4X14 (GAUZE/BANDAGES/DRESSINGS) ×1 IMPLANT
ELECT REM PT RETURN 9FT ADLT (ELECTROSURGICAL) ×2
ELECTRODE REM PT RTRN 9FT ADLT (ELECTROSURGICAL) ×1 IMPLANT
GOWN PREVENTION PLUS XLARGE (GOWN DISPOSABLE) ×2 IMPLANT
GOWN STRL NON-REIN LRG LVL3 (GOWN DISPOSABLE) ×4 IMPLANT
HANDLE STAPLE ENDO GIA SHORT (STAPLE) ×1
HEMOSTAT POWDER SURGIFOAM 1G (HEMOSTASIS) ×6 IMPLANT
KIT BASIN OR (CUSTOM PROCEDURE TRAY) ×2 IMPLANT
KIT ROOM TURNOVER OR (KITS) ×2 IMPLANT
KIT SUCTION CATH 14FR (SUCTIONS) IMPLANT
NS IRRIG 1000ML POUR BTL (IV SOLUTION) ×4 IMPLANT
PACK CHEST (CUSTOM PROCEDURE TRAY) ×2 IMPLANT
PAD ARMBOARD 7.5X6 YLW CONV (MISCELLANEOUS) ×4 IMPLANT
PAD CARDIAC INSULATION (MISCELLANEOUS) ×1 IMPLANT
PAD ELECT DEFIB RADIOL ZOLL (MISCELLANEOUS) ×1 IMPLANT
RELOAD EGIA 45 MED/THCK PURPLE (STAPLE) ×1 IMPLANT
RELOAD STAPLE 60 BLK XTHK ART (STAPLE) IMPLANT
RELOAD TRI 2.0 60 XTHK VAS SUL (STAPLE) ×8 IMPLANT
SEALANT SURG COSEAL 8ML (VASCULAR PRODUCTS) ×1 IMPLANT
SPONGE GAUZE 4X4 12PLY (GAUZE/BANDAGES/DRESSINGS) ×2 IMPLANT
STAPLER ENDO GIA 12 SHRT THIN (STAPLE) IMPLANT
STAPLER ENDO GIA 12MM SHORT (STAPLE) ×1 IMPLANT
SUT PROLENE 5 0 C 1 36 (SUTURE) ×2 IMPLANT
SUT SILK  1 MH (SUTURE) ×4
SUT SILK 1 MH (SUTURE) IMPLANT
SUT SILK 2 0 SH CR/8 (SUTURE) ×2 IMPLANT
SUT STEEL 6MS V (SUTURE) ×2 IMPLANT
SUT STEEL STERNAL CCS#1 18IN (SUTURE) IMPLANT
SUT STEEL SZ 6 DBL 3X14 BALL (SUTURE) ×3 IMPLANT
SUT VIC AB 1 CTX 27 (SUTURE) ×4 IMPLANT
SUT VIC AB 2-0 CTX 36 (SUTURE) ×4 IMPLANT
SUT VIC AB 3-0 X1 27 (SUTURE) ×4 IMPLANT
SYSTEM SAHARA CHEST DRAIN RE-I (WOUND CARE) ×3 IMPLANT
TOWEL OR 17X24 6PK STRL BLUE (TOWEL DISPOSABLE) ×2 IMPLANT
TOWEL OR 17X26 10 PK STRL BLUE (TOWEL DISPOSABLE) ×4 IMPLANT
TRAY FOLEY CATH 14FRSI W/METER (CATHETERS) ×2 IMPLANT
WATER STERILE IRR 1000ML POUR (IV SOLUTION) ×2 IMPLANT

## 2013-01-12 NOTE — Discharge Summary (Signed)
Physician Discharge Summary       [address]       [place] [address]             [phone]    Patient ID: Kaitlin [name] MRN: [id number] DOB/AGE: [birth year] 48 y.o.  Admit date: 01/12/2013 Discharge date: 01/24/2013  Admission Diagnoses: 1. Thymoma 2.History of DM 3.History of high cholesterol 4.History of hypertension 5.History of anemia 6.History of sickle cell trait 7.History of obesity  Discharge Diagnoses:  1. Thymoma 2.History of DM 3.History of high cholesterol 4.History of hypertension 5.History of anemia 6.History of sickle cell trait 7.History of obesity  Procedure (s):  MEDIAN STERNOTOMY FOR RESECTION OF A THYMOMA by Dr. Cyndia Bent on 01/12/2013.  Pathology: Thymus, thymectomy - THYMOMA (10.4CM) SEE COMMENT - TUMOR IS FOCALLY PRESENT AT SURGICAL MARGIN. - ONE BENIGN LYMPH NODE (0/1). Pathologic Staging for Thymomas (modified [place]oka Stage): IIB of IV Pathologic Staging for Thymic Carcinomas (TNM): pT2, pN0, pMX  History of Presenting Illness: The patient is a 48 year old woman who was evaluated by Dr. Alen Blew in July of 2013 for anemia and leukocytosis. She said that her primary care physician had noted an elevated white blood cell count for a while before that. Workup revealed normal flow cytometry and a high RDW and microcytosis. She says that she was in her usual state of health until about 2 weeks ago when she began developing chest pain, shortness of breath, and congestion. She reports a low-grade fever of 99-100 and some night sweats which she thought were due to menopause. She was seen in the emergency room and a CT scan the chest ruled out pulmonary emboli but did show a large anterior mediastinal mass measuring approximately 6 x 10 cm. She underwent anterior mediastinotomy at the end of October and biopsy showed thymoma. She was found to have some thickening in the tonsillar region with hypermetabolic activity on PET scan and underwent  exam under anesthesia and removal of this tissue by Dr. Erik Obey on 69/62. Pathology was negative for malignancy. She developed a cough and shortness of breath and she had fever to 101.9. She went to see Dr. Erik Obey for followup and was sent to the ER. She had a Chest CT to rule out PE which was not a great study but did not show any PE. 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 There was a moderate right pleural effusion with associated airspace opacity that could be atelectasis or pneumonia. Plans were made for resection by sternotomy but she was readmitted just before the planned surgery with shortness of breath and diagnosed with pneumonia. Surgery was postponed to allow treatment and recovery. She has been doing well since the pneumonia was treated.She was admitted to [facility] on 01/12/2013 in order to undergo a median sternotomy for thymoma resection.  Brief Hospital Course:  The patient was extubated the evening of surgery without difficulty. She remained afebrile and hemodynamically stable.  She was weaned off Neo synephrine drip.A line, chest tubes, and foley were removed early in the post operative course. Lopressor was started and titrated accordingly. She was volume overloaded and diuresed accordingly. She was weaned off the insulin drip. Once she was tolerating a diet, home diabetic medicines were restarted.  Adjustments were made to her Metformin and Insulin. Glucose then remained well controlled. Regarding her diabetes management, she will need follow up with her primary care doctor.The patient was felt surgically stable for transfer from the ICU to [facility] for further convalescence on 01/18/2012.  She the developed bronchitis and was treated with Levaquin.She has had a fair amount of incisional pain, which has limited her mobility. She is slowly progressing with cardiac rehab. She was ambulating on room air. She has been tolerating a diet and has had a bowel movement. Chest tube sutures  will be removed prior to discharge. Provided the patient remains afebrile, hemodynamically stable, and pending morning round evaluation, He/she will be surgically stable for discharge on 01/25/2012.   Latest Vital Signs: Blood pressure 102/54, pulse 92, temperature 98 F (36.7 C), temperature source Oral, resp. rate 20, height 5\' 9"  (1.753 m), weight 155.538 kg (342 lb 14.4 oz), last menstrual period 06/17/2012, SpO2 97.00%.  Physical Exam: Heart: RRR  Lungs: Few coarse BS that clear with cough  Wound: Clean and dry  Extremities: +LE edema   Discharge Condition:Stable  Recent laboratory studies:  Lab Results  Component Value Date   WBC 15.1* 01/17/2013   HGB 10.2* 01/17/2013   HCT 30.2* 01/17/2013   MCV 75.3* 01/17/2013   PLT 350 01/17/2013   Lab Results  Component Value Date   NA 147 01/21/2013   K 4.4 01/21/2013   CL 102 01/21/2013   CO2 32 01/21/2013   CREATININE 1.11* 01/21/2013   GLUCOSE 108* 01/21/2013      Diagnostic Studies: EXAM:  PORTABLE CHEST - 1 VIEW  COMPARISON: 01/16/2013  FINDINGS:  Central venous catheter tip is near the superior cavoatrial junction  and unchanged. Low lung volumes with prominent interstitial lung  markings. Heart size is upper limits of normal but unchanged. Median  sternotomy wires are present. No evidence for a pneumothorax.  IMPRESSION:  Low lung volumes with prominent interstitial lung markings. Mild  edema cannot be excluded.  Stable position of the central line.  Electronically Signed  By: Markus Daft M.D.  On: 01/17/2013 08:31      Future Appointments Provider Department Dept Phone   02/07/2013 10:00 AM Gaye Pollack, MD [facility] [place] [phone]   02/22/2013 9:30 AM Rigoberto Noel, MD [facility] [phone]      Discharge Medications:    Medication List    STOP taking these medications       azithromycin 250 MG tablet  Commonly known as:  ZITHROMAX     lisinopril 10 MG  tablet  Commonly known as:  PRINIVIL,ZESTRIL      TAKE these medications       acetaminophen 500 MG tablet  Commonly known as:  TYLENOL  Take 1,000 mg by mouth daily as needed for moderate pain.     albuterol 108 (90 BASE) MCG/ACT inhaler  Commonly known as:  PROVENTIL HFA;VENTOLIN HFA  Inhale 2 puffs into the lungs every 4 (four) hours as needed for wheezing.     aspirin 325 MG EC tablet  Take 1 tablet (325 mg total) by mouth daily.     ferrous fumarate 325 (106 FE) MG Tabs tablet  Commonly known as:  HEMOCYTE - 106 mg FE  Take 1 tablet by mouth every morning.     furosemide 20 MG tablet  Commonly known as:  LASIX  Take 1 tablet (20 mg total) by mouth daily.     guaiFENesin 600 MG 12 hr tablet  Commonly known as:  MUCINEX  Take 1 tablet (600 mg total) by mouth 2 (two) times daily. For cough     ibuprofen 200 MG tablet  Commonly known as:  ADVIL,MOTRIN  Take 800 mg by mouth daily  as needed for mild pain.     insulin glargine 100 UNIT/ML injection  Commonly known as:  LANTUS  Inject 0.3 mLs (30 Units total) into the skin at bedtime.     insulin regular 100 units/mL injection  Commonly known as:  NOVOLIN R,HUMULIN R  Inject 0.3 mLs (30 Units total) into the skin 3 (three) times daily before meals.     levofloxacin 750 MG tablet  Commonly known as:  LEVAQUIN  Take 1 tablet (750 mg total) by mouth daily. For 2 days then stop.     metFORMIN 500 MG tablet  Commonly known as:  GLUCOPHAGE  Take 1 tablet (500 mg total) by mouth 2 (two) times daily with a meal.     metoprolol tartrate 25 MG tablet  Commonly known as:  LOPRESSOR  Take 0.5 tablets (12.5 mg total) by mouth 2 (two) times daily.     multivitamin with minerals Tabs tablet  Take 1 tablet by mouth every morning.     oxyCODONE 5 MG immediate release tablet  Commonly known as:  Oxy IR/ROXICODONE  Take 1-2 tablets (5-10 mg total) by mouth every 4 (four) hours as needed for severe pain.     potassium chloride 10  MEQ tablet  Commonly known as:  K-DUR,KLOR-CON  Take 1 tablet (10 mEq total) by mouth daily.     simvastatin 40 MG tablet  Commonly known as:  ZOCOR  Take 40 mg by mouth at bedtime.     vitamin C 500 MG tablet  Commonly known as:  ASCORBIC ACID  Take 1,000 mg by mouth once as needed (for cold).     VITAMINS FOR HAIR PO  Take 1 tablet by mouth every morning.       Follow Up Appointments: Follow-up Information   Follow up with Gaye Pollack, MD On 02/07/2013. (PA/LAT CXR to be taken (at [facility] which is in the same building as Dr. Vivi Martens office) on 02/07/2013 at 9:00 am;Appointment with Dr. Cyndia Bent is on 02/07/2013 at 10:00 am)    Specialty:  Cardiothoracic Surgery   Contact information:   [address] [place] [address] [phone]      Signed: Lars Pinks MPA-C 01/22/2013, 2:01 PM

## 2013-01-12 NOTE — Plan of Care (Signed)
Problem: Phase I - Pre-Op Goal: Point person for discharge identified Outcome: Completed/Met Date Met:  01/12/13 Lives with husband.  Husband also has Myloma that he has been having chemo for.

## 2013-01-12 NOTE — Brief Op Note (Signed)
01/12/2013  12:37 PM  PATIENT:  Kaitlin [name]  48 y.o. female  PRE-OPERATIVE DIAGNOSIS:  Thymoma  POST-OPERATIVE DIAGNOSIS:  Thymoma  PROCEDURE:  Procedure(s): MEDIAN STERNOTOMY (N/A) RESECTION OF A THYMOMA (N/A)  SURGEON:  Surgeon(s) and Role:    * Gaye Pollack, MD - Primary  PHYSICIAN ASSISTANT: Jadene Pierini, PA-C    ANESTHESIA:   general  EBL:  Total I/O In: 2250 [I.V.:2000; IV Piggyback:250] Out: 1325 [Urine:675; Blood:650]  BLOOD ADMINISTERED:none  DRAINS: 2 mediastinal tubes and bilateral pleural tubes.   LOCAL MEDICATIONS USED:  NONE  SPECIMEN:  Source of Specimen:  mediastinal thymoma  DISPOSITION OF SPECIMEN:  PATHOLOGY  COUNTS:  YES  TOURNIQUET:  * No tourniquets in log *  DICTATION: .Note written in EPIC  PLAN OF CARE: Admit to inpatient   PATIENT DISPOSITION:  ICU - intubated and hemodynamically stable.   Delay start of Pharmacological VTE agent (>24hrs) due to surgical blood loss or risk of bleeding: yes

## 2013-01-12 NOTE — Op Note (Signed)
CARDIOVASCULAR SURGERY OPERATIVE NOTE  01/12/2013 Kaitlin [name] [id number]  Surgeon:  Gaye Pollack, MD  First Assistant: Jadene Pierini,  PA-C   Preoperative Diagnosis:  Anterior mediastinal thymoma   Postoperative Diagnosis:  Same   Procedure:  1. Median Sternotomy 2.   Resection of thymoma  Anesthesia:  General Endotracheal   Clinical History/Surgical Indication:  The patient is a 48 year old woman who was evaluated by Dr. Alen Blew in July of 2013 for anemia and leukocytosis. She said that her primary care physician had noted an elevated white blood cell count for a while before that. Workup revealed normal flow cytometry and a high RDW and microcytosis. She says that she was in her usual state of health until about 2 weeks ago when she began developing chest pain, shortness of breath, and congestion. She reports a low-grade fever of 99-100 and some night sweats which she thought were due to menopause. She was seen in the emergency room and a CT scan the chest ruled out pulmonary emboli but did show a large anterior mediastinal mass measuring approximately 6 x 10 cm. She underwent anterior mediastinotomy at the end of October and biopsy showed thymoma. She was found to have some thickening in the tonsillar region with hypermetabolic activity on PET scan and underwent exam under anesthesia and removal of this tissue by Dr. Erik Obey on 59/56. Pathology was negative for malignancy. She developed a cough and shortness of breath and she had fever to 101.9. She went to see Dr. Erik Obey for followup and was sent to the ER. She had a Chest CT to rule out PE which was not a great study but did not show any PE. 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 There was a moderate right pleural effusion with associated airspace opacity that could be atelectasis or pneumonia. Plans were made for resection by sternotomy but she was readmitted just before the planned surgery with shortness of breath and  diagnosed with pneumonia. Surgery was postponed to allow treatment and recovery. She has been doing well since the pneumonia was treated.  I discussed surgical resection with her and her husband including alternatives, benefits and risks including but not limited to bleeding, blood transfusion, infection, injury to mediastinal structures, incomplete resection, phrenic nerve paralysis, respiratory failure, need for postop radiation therapy, and tumor recurrence. She understands and agrees to proceed.  Preparation:  The patient was seen in the preoperative holding area and the correct patient, correct operation were confirmed with the patient after reviewing the medical record and catheterization. The consent was signed by me. Preoperative antibiotics were given. A pulmonary arterial line and radial arterial line were placed by the anesthesia team. The patient was taken back to the operating room and positioned supine on the operating room table. After being placed under general endotracheal anesthesia by the anesthesia team a foley catheter was placed. The neck, chest, abdomen, and both legs were prepped with betadine soap and solution and draped in the usual sterile manner. A surgical time-out was taken and the correct patient and operative procedure were confirmed with the nursing and anesthesia staff.   Median Sternotomy and Thymoma Resection:  A median sternotomy was performed. There was a large tumor in the anterior mediastinum as seen on CT. The pericardium was opened in the midline. Palpation of the pericardial cavity showed no evidence of invasion through the pericardium to the serosal surface. The tumor was firmly adherent to the outside of the pericardium. The innominate vein was identified and the tumor  easily separated from it. There were extensions of the thymus gland superiorly above the innominate vein up to the lower poles of the thyroid. These thymic extensions were separated from the  thyroid and remained with the specimen. The Ruhltract retractor was placed to elevate the left sternal half. The pericardium was divided towards the left over the pulmonary artery maintaining a margin from the thymoma. The pericardium was divided down to the left phrenic nerve which was identified and free from the tumor. The pericardium was divided superiorly up to the reflection over the aortic arch and mobilized medially off the undersurface of the innominate vein. The left pleural space was examined and there were no pleural implants or adhesions noted. The pericardium was then divided beneath the innominate vein moving to the right until the innominate/SVC junction was identified. The right phrenic nerve was identified and the tumor did not appear to be involving this and it was preserved. The pericardium was then divided inferiorly just anterior to the phrenic nerve. A Ruhltract retractor was then used to elevate the right side of the sternum and the tumor was separated from the anterior chest wall using electrocautery. It did not appear to be invading the chest wall but was adherent due to adhesion from her prior anterior mediastinotomy for biopsy. The tumor was adherent to the upper and middle lobes of the lung in two areas and a 60 mm black stapler was used to divide the lung in 2 places and the wedge of lung remained attached to the tumor. I am not sure if this was active invasion or just firm inflammatory adherence but I suspect the latter. It was easier and safer to resect these two small pieces of lung with the tumor. The specimen was then passed off the table and sent to pathology. I think there was a complete resection of the thymus and thymoma. It did not appear to be actively invading any vital structures and I don't think any was left behind. The right pleural space was examined closely and not tumor implants were seen.   Hemostasis was achieved. Mediastinal drainage tubes were placed. Bilateral  pleural tubes were placed. The sternum was closed with double #6 stainless steel wires. The fascia was closed with continuous # 1 vicryl suture. The subcutaneous tissue was closed with 2-0 vicryl continuous suture. The skin was closed with 3-0 vicryl subcuticular suture. All sponge, needle, and instrument counts were reported correct at the end of the case. Dry sterile dressings were placed over the incisions and around the chest tubes which were connected to pleurevac suction. The patient was then transported to the surgical intensive care unit in critical but stable condition.

## 2013-01-12 NOTE — Transfer of Care (Signed)
Immediate Anesthesia Transfer of Care Note  Patient: Kaitlin [name]  Procedure(s) Performed: Procedure(s): MEDIAN STERNOTOMY (N/A) RESECTION OF A THYMOMA (N/A)  Patient Location: SICU  Anesthesia Type:General  Level of Consciousness: sedated  Airway & Oxygen Therapy: Patient remains intubated per anesthesia plan and Patient placed on Ventilator (see vital sign flow sheet for setting)  Post-op Assessment: Report given to PACU RN and Post -op Vital signs reviewed and stable  Post vital signs: Reviewed and stable  Complications: No apparent anesthesia complications

## 2013-01-12 NOTE — Discharge Instructions (Signed)
Activity: 1.May walk up steps                2.No lifting more than ten pounds for four weeks.                 3.No driving for four weeks.                4.Stop any activity that causes chest pain, shortness of breath, dizziness, sweating or excessive weakness.                5.Avoid straining.                6.Continue with your breathing exercises daily.  Diet: Diabetic, low fat and Low salt diet  Wound Care: May shower.  Clean wounds with mild soap and water daily. Contact the office at [phone] if any problems arise.

## 2013-01-12 NOTE — Progress Notes (Signed)
INITIAL NUTRITION ASSESSMENT  DOCUMENTATION CODES Per approved criteria  -Morbid Obesity   INTERVENTION:  If EN started, recommend Vital HP formula -- initiate at 25 ml/jr and advance by 10 ml every 4 hours to goal rate of 65 ml/hr with Prostat liquid protein 30 ml BID via tube to provide 1640 kcals (67% of estimated kcal needs), 156 gm protein (100% of estimated protein needs), 1304 ml of free water     *Above recommendations not adjusted for Propofol infusion which provides 1.1 kcal/mL RD to follow for nutrition care plan  NUTRITION DIAGNOSIS: Inadequate oral intake related to inability to eat as evidenced by NPO status  Goal: Initiate EN support within next 24-48 hours if prolonged intubation expected  Monitor:  EN initiation & tolerance, respiratory status, weight, labs, I/O's  Reason for Assessment: VDRF  48 y.o. female  Admitting Dx: thymoma  ASSESSMENT: Patient with PMH of DM, HTN and PNA; presented for surgery after CT scan showed large anterior mediastinal mass.  Patient s/p procedures 01/12/13: MEDIAN STERNOTOMY RESECTION OF THYMOMA  Patient is currently intubated on ventilator support -- OGT in place  MV:  9.8 L/min Temp (24hrs), Avg:98.6 F (37 C), Min:98.6 F (37 C), Max:98.6 F (37 C) Propofol: 46.5 ml/hr -- 1228 fat kcals   Height: Ht Readings from Last 1 Encounters:  01/09/13 5\' 9"  (1.753 m)    Weight: Wt Readings from Last 1 Encounters:  01/09/13 341 lb 4.8 oz (154.813 kg)    Ideal Body Weight: 145 lb  % Ideal Body Weight: 235%  Wt Readings from Last 10 Encounters:  01/09/13 341 lb 4.8 oz (154.813 kg)  12/21/12 335 lb 9.6 oz (152.227 kg)  12/20/12 350 lb (158.759 kg)  12/12/12 350 lb 5 oz (158.9 kg)  11/30/12 340 lb 2.7 oz (154.3 kg)  11/30/12 340 lb 2.7 oz (154.3 kg)  11/29/12 340 lb (154.223 kg)  11/29/12 153 lb 9.6 oz (69.673 kg)  11/14/12 341 lb 1.6 oz (154.722 kg)  11/06/12 341 lb 0.8 oz (154.7 kg)    Usual Body Weight: 335  lb  % Usual Body Weight: 102%  BMI:  50.3 kg/m2  Estimated Nutritional Needs: Kcal: 2450 Protein: 145-155 gm Fluid: per MD  Skin: chest surgical incision   Diet Order: NPO  EDUCATION NEEDS: -No education needs identified at this time   Intake/Output Summary (Last 24 hours) at 01/12/13 1351 Last data filed at 01/12/13 1315  Gross per 24 hour  Intake   3250 ml  Output   1325 ml  Net   1925 ml    Labs:   Recent Labs Lab 01/09/13 1327 01/12/13 1132  NA 141 139  K 4.4 4.7  CL 104  --   CO2 21  --   BUN 17  --   CREATININE 0.95  --   CALCIUM 9.4  --   GLUCOSE 261* 288*    CBG (last 3)   Recent Labs  01/12/13 0551  GLUCAP 109*    Scheduled Meds: . [START ON 01/13/2013] acetaminophen  1,000 mg Oral Q6H   Or  . [START ON 01/13/2013] acetaminophen (TYLENOL) oral liquid 160 mg/5 mL  1,000 mg Per Tube Q6H  . acetaminophen (TYLENOL) oral liquid 160 mg/5 mL  650 mg Per Tube Once   Or  . acetaminophen  650 mg Rectal Once  . [START ON 01/13/2013] bisacodyl  10 mg Oral Daily   Or  . [START ON 01/13/2013] bisacodyl  10 mg Rectal Daily  . cefUROXime (  ZINACEF)  IV  1.5 g Intravenous Q12H  . [START ON 01/13/2013] docusate sodium  200 mg Oral Daily  . famotidine (PEPCID) IV  20 mg Intravenous Q12H  . insulin regular  0-10 Units Intravenous TID WC  . levalbuterol  0.63 mg Nebulization Q6H  . metoprolol tartrate  12.5 mg Oral BID   Or  . metoprolol tartrate  12.5 mg Per Tube BID  . [START ON 01/14/2013] pantoprazole  40 mg Oral Daily  . potassium chloride  10 mEq Intravenous Q1 Hr x 3  . [START ON 01/13/2013] sodium chloride  3 mL Intravenous Q12H  . vancomycin  1,000 mg Intravenous Once    Continuous Infusions: . sodium chloride    . [START ON 01/13/2013] sodium chloride    . dexmedetomidine    . insulin (NOVOLIN-R) infusion    . lactated ringers    . nitroGLYCERIN    . phenylephrine (NEO-SYNEPHRINE) Adult infusion      Past Medical History  Diagnosis Date  . Diabetes  mellitus   . High cholesterol   . Leukocytosis   . Anemia   . Obesity   . Sickle cell trait   . Hypertension     takes medicine to protect kidneys, does not have HTN  . Bronchitis   . Pneumonia     Past Surgical History  Procedure Laterality Date  . Tonsillectomy    . Tubal ligation    . Mediastinotomy chamberlain mcneil Right 11/06/2012    Procedure: MEDIASTINOTOMY CHAMBERLAIN MCNEILPROCEDURE;  Surgeon: Gaye Pollack, MD;  Location: [facility] OR;  Service: Thoracic;  Laterality: Right;  . Lymph node biopsy Right 11/30/2012    Procedure: RIGHT TONSIL BIOPSY WITH FRESH FROZEN ANALYSIS;  Surgeon: Jodi Marble, MD;  Location: [place];  Service: ENT;  Laterality: Right;    Arthur Holms, RD, LDN Pager #: [phone] After-Hours Pager #: [phone]

## 2013-01-12 NOTE — Progress Notes (Signed)
CT surgery p.m. Rounds  Patient on ventilator after extended mediastinal dissection for large thymoma, median sternotomy and left upper lobe wedge resection. Patient remains sedated, hemodynamic stable Hyperglycemia blood sugar greater than 350 on insulin drip protocol Not bleeding

## 2013-01-12 NOTE — Anesthesia Postprocedure Evaluation (Signed)
  Anesthesia Post-op Note  Patient: Kaitlin [name]  Procedure(s) Performed: Procedure(s): MEDIAN STERNOTOMY (N/A) RESECTION OF A THYMOMA (N/A)  Patient Location: ICU  Anesthesia Type:General  Level of Consciousness: sedated  Airway and Oxygen Therapy: Patient remains intubated per anesthesia plan  Post-op Pain: none  Post-op Assessment: Post-op Vital signs reviewed, Patient's Cardiovascular Status Stable and Respiratory Function Stable  Post-op Vital Signs: Reviewed and stable  Complications: No apparent anesthesia complications

## 2013-01-12 NOTE — Procedures (Signed)
Extubation Procedure Note  Patient Details:   Name: Kaitlin [name] DOB: April 08, 1965 MRN: [id number]    Pt extubated to 4L [place] per protocol, NIF, VC, & VS WNL. Pt tolerated well, able to vocalize, no stridor noted, RT to monitor.    Evaluation  O2 sats: stable throughout Complications: No apparent complications Patient did tolerate procedure well. Bilateral Breath Sounds: Diminished   Yes  Jetty Peeks 01/12/2013, 6:51 PM

## 2013-01-12 NOTE — H&P (Signed)
[address]       [name] [address]             [phone]      Cardiothoracic Surgery History and Physical  PCP is Teodoro Spray, MD  Referring Provider is Teodoro Spray, MD  Chief Complaint   Patient presents with  Anterior mediastinal thymoma         HPI:  The patient is a 48 year old woman who was evaluated by Dr. Clelia Croft in July of 2013 for anemia and leukocytosis. She said that her primary care physician had noted an elevated white blood cell count for a while before that. Workup revealed normal flow cytometry and a high RDW and microcytosis. She says that she was in her usual state of health until about 2 weeks ago when she began developing chest pain, shortness of breath, and congestion. She reports a low-grade fever of 99-100 and some night sweats which she thought were due to menopause. She was seen in the emergency room and a CT scan the chest ruled out pulmonary emboli but did show a large anterior mediastinal mass measuring approximately 6 x 10 cm. She underwent anterior mediastinotomy at the end of October and biopsy showed thymoma.  She was found to have some thickening in the tonsillar region with hypermetabolic activity on PET scan and underwent exam under anesthesia and removal of this tissue by Dr. Lazarus Salines on 11/20. Pathology was negative for malignancy. She developed a cough and shortness of breath and she had fever to 101.9. She went to see Dr. Lazarus Salines for followup and was sent to the ER. She had a Chest CT to rule out PE which was not a great study but did not show any PE. 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 There was a moderate right pleural effusion with associated airspace opacity that could be atelectasis or pneumonia. Plans were made for resection by sternotomy but she was readmitted just before the planned surgery with shortness of breath and diagnosed with pneumonia.  Surgery was postponed to allow treatment and recovery. She  has been doing well since the pneumonia was treated. Past Medical History   Diagnosis  Date   .  Diabetes mellitus    .  High cholesterol    .  Leukocytosis    .  Anemia    .  Obesity    .  Hypertension    .  Sickle cell trait     Past Surgical History   Procedure  Laterality  Date   .  Tonsillectomy      Family History   Problem  Relation  Age of Onset   .  Heart disease  Father    .  Diabetes type II       Family HX   Social History  History   Substance Use Topics   .  Smoking status:  Never Smoker   .  Smokeless tobacco:  Never Used   .  Alcohol Use:  No    Current Outpatient Prescriptions   Medication  Sig  Dispense  Refill   .  albuterol (PROVENTIL HFA;VENTOLIN HFA) 108 (90 BASE) MCG/ACT inhaler  Inhale 2 puffs into the lungs every 4 (four) hours as needed for wheezing.  1 Inhaler  0   .  azithromycin (ZITHROMAX Z-PAK) 250 MG tablet  2 po day one, then 1 daily x 4 days  5 tablet  0   .  ferrous fumarate (HEMOCYTE -  106 MG FE) 325 (106 FE) MG TABS tablet  Take 1 tablet by mouth daily.     .  furosemide (LASIX) 20 MG tablet  Take 10 mg by mouth daily.     Marland Kitchen  HYDROcodone-homatropine (HYCODAN) 5-1.5 MG/5ML syrup  Take 2.5 mLs by mouth every 6 (six) hours as needed for cough.  30 mL  0   .  ibuprofen (ADVIL,MOTRIN) 200 MG tablet  Take 600 mg by mouth every 8 (eight) hours as needed. For pain.     Marland Kitchen  insulin glargine (LANTUS) 100 UNIT/ML injection  Inject 30 Units into the skin at bedtime.     .  insulin regular (NOVOLIN R,HUMULIN R) 100 units/mL injection  Inject into the skin 3 (three) times daily before meals.     Marland Kitchen  lisinopril (PRINIVIL,ZESTRIL) 10 MG tablet  Take 10 mg by mouth daily.     .  metFORMIN (GLUCOPHAGE) 500 MG tablet  Take 500 mg by mouth 2 (two) times daily with a meal.     .  Multiple Vitamin (MULTIVITAMIN WITH MINERALS) TABS  Take 1 tablet by mouth daily.     .  Multiple Vitamins-Minerals (IMMUNE SUPPORT VITAMIN C) PACK  Take 1 Package by mouth daily.     .   simvastatin (ZOCOR) 40 MG tablet  Take 40 mg by mouth at bedtime.      No current facility-administered medications for this visit.    Allergies   Allergen  Reactions   .  Amoxicillin-Pot Clavulanate  Diarrhea   Review of Systems  Constitutional: Positive for fatigue and unexpected weight change. Negative for diaphoresis.  Weight gain but says she isn't eating much.  Eyes: Negative.  Respiratory: negative Cardiovascular: Positive for chest pain and leg swelling. Negative for palpitations.  Gastrointestinal: Negative.  Endocrine: Negative.  Genitourinary: Negative.  Musculoskeletal: Negative.  Skin: Negative.  Allergic/Immunologic: Negative.  Neurological: Positive for headaches.  Hematological: Negative.  Psychiatric/Behavioral: Negative.  BP 126/68  Pulse 106  Resp 20  Ht 5\' 9"  (1.753 m)  Wt 344 lb (156.037 kg)  BMI 50.78 kg/m2  SpO2 96%  LMP 06/17/2012  Physical Exam  Constitutional: She is oriented to person, place, and time.  Morbidly obese woman in no distress  HENT:  Head: Normocephalic and atraumatic.  Mouth/Throat: Oropharynx is clear and moist.  Eyes: Conjunctivae and EOM are normal. Pupils are equal, round, and reactive to light.  Neck: Normal range of motion. Neck supple. No JVD present. No tracheal deviation present. No thyromegaly present.  No supraclavicular or axillary adenopathy  Cardiovascular: Normal rate, regular rhythm, normal heart sounds and intact distal pulses. Exam reveals no gallop and no friction rub.  No murmur heard.  Pulmonary/Chest: Effort normal. No respiratory distress. She has no wheezes. She has no rales. She exhibits no tenderness.  Abdominal: Soft. Bowel sounds are normal. She exhibits no mass. There is no tenderness.  Musculoskeletal: Normal range of motion. She exhibits edema. She exhibits no tenderness.  In legs  Lymphadenopathy:  She has no cervical adenopathy.  Neurological: She is alert and oriented to person, place, and time.  She has normal strength. No cranial nerve deficit or sensory deficit.  Skin: Skin is warm and dry.  Psychiatric: She has a normal mood and affect.  Diagnostic Tests:  *RADIOLOGY REPORT*  Clinical Data: Abnormal chest radiograph, shortness of breath,  congestion, chest tightness, evaluate for pulmonary embolism,  initial encounter.  CT ANGIOGRAPHY CHEST  Technique: Multidetector CT imaging of the chest  using the  standard protocol during bolus administration of intravenous  contrast. Multiplanar reconstructed images including MIPs were  obtained and reviewed to evaluate the vascular anatomy.  Contrast: 153mL OMNIPAQUE IOHEXOL 350 MG/ML SOLN  Comparison: Chest radiograph - earlier same day  Vascular Findings:  There is suboptimal opacification of the pulmonary arterial system  with the main pulmonary artery measuring only 191 HU. There are no  discrete filling defects within the central pulmonary arterial tree  to suggest central pulmonary embolism. Evaluation of the distal  segmental and distal subsegmental pulmonary arteries is degraded  due to a combination of suboptimal vessel opacification,  respiratory artifact and patient body habitus. Normal-caliber of  the main pulmonary artery.  Borderline cardiomegaly. Calcifications of the mitral valve  annulus. No pericardial effusion.  Normal caliber of the thoracic aorta. Conventional configuration  of the aortic arch. No definite periaortic stranding.  -------------------------------------------------------------  Nonvascular findings:  Evaluation of the pulmonary parenchyma is degraded due to patient  respiratory artifact.  There is an approximately 9.6 x 7.8 x 8.0 cm (as measured in  greatest oblique axial and cranial caudal dimensions respectively -  axial image 25, series 6; coronal image 49, series 9) anterior  mediastinal mass which is noted to contain an internal dystrophic  calcification (images 24 - 26, series six). This  mass appears  separate from a mild diffusely enlarged but otherwise normal  appearing thyroid gland. This mass is without definitive  associated mediastinal or hilar lymphadenopathy with index  pretracheal lymph node measuring 8 mm in diameter (image 70, series  12). There are shoddy bilateral axillary lymph nodes with index  right axillary node measuring 8 mm in short axis diameter (image  31, series 6) and index left axillary lymph node measuring  approximately 1.1 cm in diameter (image 40, series 12).  There is minimal dependent ground-glass atelectasis. No focal  airspace opacity. No discrete pulmonary nodules. No pleural  effusion or pneumothorax. The central pulmonary airways are widely  patent.  Early arterial phase evaluation of the upper abdomen demonstrates  borderline enlargement of the spleen measuring approximately 12.6  cm in length and approximately 6.9 cm in diameter.  No acute or aggressive osseous abnormalities. There is mild  multilevel DDD throughout the thoracic spine, worse at T5 - T6, T7  - T8 and T8 - T9 with disc space height loss, end plate  irregularity and small posteriorly directed disc osteophyte  complexes at these levels.  IMPRESSION:  1. No central pulmonary embolism. Evaluation the segmental and  distal subsegmental pulmonary arteries is degraded due to a  combination of suboptimal vessel opacification, patient body  habitus and patient respiratory artifact. Further evaluation with  bilateral venous Doppler ultrasound may be performed as clinically  indicated.  2. Large, approximately 9.6 cm anterior mediastinal mass  correlates with the findings on recently performed chest  radiograph. While this mass is indeterminate, differential  considerations include lymphoma, teratoma or a lesion of thymic  etiology. Further evaluation with a PET scan may be performed as  clinically indicated.  3. Borderline splenomegaly and shoddy nonspecific axillary lymph    nodes with a solitary left axillary lymph node which approaches  enlargement by size criteria. Given the above described anterior  mediastinal mass, lymphomatous involvement is not excluded.  4. Multilevel thoracic spine degenerative change.  Original Report Authenticated By: Jake Seats, MD  Impression:   CLINICAL DATA: Initial treatment strategy for thymoma.  EXAM:  NUCLEAR MEDICINE PET SKULL BASE TO  THIGH  FASTING BLOOD GLUCOSE: Value: 171 mg/dl  TECHNIQUE:  18.6 mCi F-18 FDG was injected intravenously. CT data was obtained  and used for attenuation correction and anatomic localization only.  (This was not acquired as a diagnostic CT examination.) Additional  exam technical data entered on technologist worksheet.  COMPARISON: CT chest dated 10/17/2012  FINDINGS:  Note: The study is limited by heterogeneous FDG uptake due to a  combination of body habitus, hyperglycemia, and streak artifact from  the patient's arms.  NECK  Hypermetabolism in the posterior nasopharynx, symmetric, likely  physiologic.  2.8 x 2.1 cm soft tissue lesion in the right oropharynx, adjacent to  the right pontine tonsil (series 2/ image 30), max SUV 11.3.  No hypermetabolic lymph nodes in the neck. Scattered small bilateral  cervical lymph nodes which do not meet pathologic CT size criteria.  CHEST  6.5 x 10.2 cm anterior mediastinal mass with calcifications (series  2/ image 70), corresponding to biopsy-proven thymoma, max SUV 12.7.  11 mm ground-glass nodule in the right upper lobe (series 2/ image  80), new, favored to be infectious/inflammatory. Additional linear  scarring in the anterior right upper lobe. Small right pleural  effusion, new.  No hypermetabolic mediastinal or hilar nodes.  ABDOMEN/PELVIS  Heterogeneous hypermetabolism within the liver and spleen, without  focal lesion.  No abnormal hypermetabolic activity within the pancreas and adrenal  glands.  Heterogeneous GI uptake.  No  hypermetabolic lymph nodes in the abdomen or pelvis.  SKELETON  Heterogeneous osseous uptake without suspicious focal lesion to  suggest osseous metastasis.  IMPRESSION:  10.2 cm anterior mediastinal mass, corresponding to biopsy-proven  thymoma, max SUV 12.7.  11 mm ground-glass nodule in the right upper lobe, new, favored to  be infectious/inflammatory. Small right pleural effusion, new.  No findings specific for metastatic disease.  2.8 cm soft tissue lesion in the right oropharynx, max SUV 11.3. ENT  consultation with direct visualization is suggested to exclude a  head/neck primary lesion.  Electronically Signed  By: Julian Hy M.D.  On: 11/15/2012 09:43    Impression:   Anterior mediastinal thymoma  Plan:  Median sternotomy and surgical resection. I discussed surgical resection with her and her husband including alternatives, benefits and risks including but not limited to bleeding, blood transfusion, infection, injury to mediastinal structures, incomplete resection, phrenic nerve paralysis, respiratory failure, need for postop radiation therapy, and tumor recurrence. She understands and agrees to proceed.

## 2013-01-12 NOTE — Anesthesia Preprocedure Evaluation (Addendum)
Anesthesia Evaluation  Patient identified by MRN, date of birth, ID band Patient awake    Reviewed: Allergy & Precautions, H&P , NPO status , Patient's Chart, lab work & pertinent test results  Airway Mallampati: II TM Distance: >3 FB Neck ROM: Full    Dental  (+) Dental Advisory Given and Teeth Intact   Pulmonary pneumonia -,  breath sounds clear to auscultation        Cardiovascular hypertension, Rhythm:Regular Rate:Normal     Neuro/Psych    GI/Hepatic   Endo/Other  diabetes  Renal/GU      Musculoskeletal   Abdominal (+) + obese,   Peds  Hematology  (+) Blood dyscrasia, Sickle cell trait ,   Anesthesia Other Findings   Reproductive/Obstetrics                        Anesthesia Physical Anesthesia Plan  ASA: III  Anesthesia Plan: General   Post-op Pain Management:    Induction: Intravenous  Airway Management Planned: Oral ETT  Additional Equipment: Arterial line and CVP  Intra-op Plan:   Post-operative Plan: Extubation in OR  Informed Consent: I have reviewed the patients History and Physical, chart, labs and discussed the procedure including the risks, benefits and alternatives for the proposed anesthesia with the patient or authorized representative who has indicated his/her understanding and acceptance.   Dental advisory given  Plan Discussed with: CRNA and Anesthesiologist  Anesthesia Plan Comments: (Thymoma recently treated for pneumonia and R. Pleural effusion now clinically improved Obesity Type2 DM glucose 109 Htn  Plan GA with oral ETT and art line)        Anesthesia Quick Evaluation

## 2013-01-12 NOTE — Anesthesia Procedure Notes (Signed)
Procedure Name: Intubation Date/Time: 01/12/2013 7:51 AM Performed by: Gaylene Brooks Pre-anesthesia Checklist: Patient identified, Emergency Drugs available, Suction available, Patient being monitored and Timeout performed Patient Re-evaluated:Patient Re-evaluated prior to inductionOxygen Delivery Method: Circle system utilized Preoxygenation: Pre-oxygenation with 100% oxygen Intubation Type: IV induction Grade View: Grade I Tube type: Oral Tube size: 8.0 mm Number of attempts: 1 Airway Equipment and Method: Stylet and Video-laryngoscopy Placement Confirmation: ETT inserted through vocal cords under direct vision,  positive ETCO2,  CO2 detector and breath sounds checked- equal and bilateral Secured at: 24 cm Tube secured with: Tape Dental Injury: Teeth and Oropharynx as per pre-operative assessment

## 2013-01-12 NOTE — Interval H&P Note (Signed)
History and Physical Interval Note:  01/12/2013 7:34 AM  Kaitlin [name]  has presented today for surgery, with the diagnosis of Thymoma  The various methods of treatment have been discussed with the patient and family. After consideration of risks, benefits and other options for treatment, the patient has consented to  Procedure(s): MEDIAN STERNOTOMY (N/A) RESECTION OF A THYMOMA (N/A) as a surgical intervention .  The patient's history has been reviewed, patient examined, no change in status, stable for surgery.  I have reviewed the patient's chart and labs.  Questions were answered to the patient's satisfaction.     Gaye Pollack

## 2013-01-13 ENCOUNTER — Inpatient Hospital Stay (HOSPITAL_COMMUNITY): Payer: BC Managed Care – PPO

## 2013-01-13 LAB — GLUCOSE, CAPILLARY
GLUCOSE-CAPILLARY: 292 mg/dL — AB (ref 70–99)
GLUCOSE-CAPILLARY: 253 mg/dL — AB (ref 70–99)
GLUCOSE-CAPILLARY: 173 mg/dL — AB (ref 70–99)
Glucose-Capillary: 114 mg/dL — ABNORMAL HIGH (ref 70–99)
Glucose-Capillary: 117 mg/dL — ABNORMAL HIGH (ref 70–99)
Glucose-Capillary: 119 mg/dL — ABNORMAL HIGH (ref 70–99)
Glucose-Capillary: 144 mg/dL — ABNORMAL HIGH (ref 70–99)
Glucose-Capillary: 213 mg/dL — ABNORMAL HIGH (ref 70–99)
Glucose-Capillary: 326 mg/dL — ABNORMAL HIGH (ref 70–99)
Glucose-Capillary: 349 mg/dL — ABNORMAL HIGH (ref 70–99)
Glucose-Capillary: 224 mg/dL — ABNORMAL HIGH (ref 70–99)

## 2013-01-13 LAB — BASIC METABOLIC PANEL
BUN: 20 mg/dL (ref 6–23)
CHLORIDE: 104 meq/L (ref 96–112)
CO2: 26 mEq/L (ref 19–32)
CREATININE: 1.24 mg/dL — AB (ref 0.50–1.10)
Calcium: 8.2 mg/dL — ABNORMAL LOW (ref 8.4–10.5)
GFR, EST AFRICAN AMERICAN: 60 mL/min — AB (ref >90)
GFR, EST NON AFRICAN AMERICAN: 52 mL/min — AB (ref >90)
Glucose, Bld: 100 mg/dL — ABNORMAL HIGH (ref 70–99)
POTASSIUM: 4.8 meq/L (ref 3.7–5.3)
Sodium: 141 mEq/L (ref 137–147)

## 2013-01-13 LAB — CBC
HCT: 30.8 % — ABNORMAL LOW (ref 36.0–46.0)
HEMATOCRIT: 31.9 % — AB (ref 36.0–46.0)
Hemoglobin: 10.7 g/dL — ABNORMAL LOW (ref 12.0–15.0)
Hemoglobin: 10.7 g/dL — ABNORMAL LOW (ref 12.0–15.0)
MCH: 26 pg (ref 26.0–34.0)
MCH: 25.5 pg — ABNORMAL LOW (ref 26.0–34.0)
MCHC: 34.7 g/dL (ref 30.0–36.0)
MCHC: 33.5 g/dL (ref 30.0–36.0)
MCV: 74.8 fL — AB (ref 78.0–100.0)
MCV: 76.1 fL — AB (ref 78.0–100.0)
PLATELETS: 273 10*3/uL (ref 150–400)
Platelets: 250 10*3/uL (ref 150–400)
RBC: 4.12 MIL/uL (ref 3.87–5.11)
RBC: 4.19 MIL/uL (ref 3.87–5.11)
RDW: 16.4 % — AB (ref 11.5–15.5)
RDW: 16.6 % — ABNORMAL HIGH (ref 11.5–15.5)
WBC: 15.8 10*3/uL — AB (ref 4.0–10.5)
WBC: 20.7 10*3/uL — AB (ref 4.0–10.5)

## 2013-01-13 LAB — POCT I-STAT, CHEM 8
BUN: 20 mg/dL (ref 6–23)
CALCIUM ION: 1.24 mmol/L — AB (ref 1.12–1.23)
CREATININE: 1 mg/dL (ref 0.50–1.10)
Chloride: 103 mEq/L (ref 96–112)
Glucose, Bld: 174 mg/dL — ABNORMAL HIGH (ref 70–99)
HCT: 32 % — ABNORMAL LOW (ref 36.0–46.0)
Hemoglobin: 10.9 g/dL — ABNORMAL LOW (ref 12.0–15.0)
Potassium: 4.5 mEq/L (ref 3.7–5.3)
Sodium: 140 mEq/L (ref 137–147)
TCO2: 24 mmol/L (ref 0–100)

## 2013-01-13 LAB — MAGNESIUM
Magnesium: 1.6 mg/dL (ref 1.5–2.5)
Magnesium: 1.9 mg/dL (ref 1.5–2.5)

## 2013-01-13 LAB — CREATININE, SERUM
Creatinine, Ser: 0.95 mg/dL (ref 0.50–1.10)
GFR calc non Af Amer: 71 mL/min — ABNORMAL LOW (ref >90)
GFR, EST AFRICAN AMERICAN: 83 mL/min — AB (ref >90)

## 2013-01-13 LAB — PREPARE RBC (CROSSMATCH)

## 2013-01-13 MED ORDER — METHYLPREDNISOLONE SODIUM SUCC 125 MG IJ SOLR
80.0000 mg | Freq: Once | INTRAMUSCULAR | Status: AC
  Administered 2013-01-13: 80 mg via INTRAVENOUS
  Filled 2013-01-13: qty 1.28

## 2013-01-13 MED ORDER — INSULIN DETEMIR 100 UNIT/ML ~~LOC~~ SOLN
25.0000 [IU] | Freq: Two times a day (BID) | SUBCUTANEOUS | Status: DC
  Administered 2013-01-13: 25 [IU] via SUBCUTANEOUS
  Filled 2013-01-13 (×3): qty 0.25

## 2013-01-13 MED ORDER — ALBUMIN HUMAN 5 % IV SOLN
INTRAVENOUS | Status: AC
  Administered 2013-01-13: 12.5 g
  Filled 2013-01-13: qty 250

## 2013-01-13 MED ORDER — ALBUMIN HUMAN 5 % IV SOLN
INTRAVENOUS | Status: AC
  Administered 2013-01-13: 250 mL via INTRAVENOUS
  Filled 2013-01-13: qty 250

## 2013-01-13 MED ORDER — INSULIN ASPART 100 UNIT/ML ~~LOC~~ SOLN
4.0000 [IU] | Freq: Three times a day (TID) | SUBCUTANEOUS | Status: DC
  Administered 2013-01-13 – 2013-01-14 (×4): 4 [IU] via SUBCUTANEOUS

## 2013-01-13 MED ORDER — INSULIN DETEMIR 100 UNIT/ML ~~LOC~~ SOLN
20.0000 [IU] | Freq: Two times a day (BID) | SUBCUTANEOUS | Status: DC
  Administered 2013-01-13: 20 [IU] via SUBCUTANEOUS
  Filled 2013-01-13 (×2): qty 0.2

## 2013-01-13 MED ORDER — FUROSEMIDE 10 MG/ML IJ SOLN
20.0000 mg | Freq: Every day | INTRAMUSCULAR | Status: DC
  Administered 2013-01-13: 20 mg via INTRAVENOUS
  Filled 2013-01-13 (×3): qty 2

## 2013-01-13 MED ORDER — METHYLPREDNISOLONE SODIUM SUCC 125 MG IJ SOLR
INTRAMUSCULAR | Status: AC
  Filled 2013-01-13: qty 2

## 2013-01-13 MED ORDER — INSULIN ASPART 100 UNIT/ML ~~LOC~~ SOLN
0.0000 [IU] | SUBCUTANEOUS | Status: DC
  Administered 2013-01-13: 2 [IU] via SUBCUTANEOUS
  Administered 2013-01-13: 4 [IU] via SUBCUTANEOUS
  Administered 2013-01-13: 12 [IU] via SUBCUTANEOUS
  Administered 2013-01-14: 8 [IU] via SUBCUTANEOUS
  Administered 2013-01-14 (×2): 2 [IU] via SUBCUTANEOUS
  Administered 2013-01-14: 8 [IU] via SUBCUTANEOUS
  Administered 2013-01-14: 16 [IU] via SUBCUTANEOUS
  Administered 2013-01-14: 8 [IU] via SUBCUTANEOUS
  Administered 2013-01-15: 4 [IU] via SUBCUTANEOUS
  Administered 2013-01-15: 20 [IU] via SUBCUTANEOUS
  Administered 2013-01-15: 16 [IU] via SUBCUTANEOUS
  Administered 2013-01-15: 4 [IU] via SUBCUTANEOUS
  Administered 2013-01-15: 8 [IU] via SUBCUTANEOUS

## 2013-01-13 NOTE — Progress Notes (Signed)
CT surgery p.m. Rounds  Out of bed to chair Neo-Synephrine dose Wayne by 25% Minimal serous chest tube drainage Adequate urine output with gentle diuresis P.m. labs reviewed and are satisfactory Increase Lantus dose for persistent hyperglycemia

## 2013-01-13 NOTE — Progress Notes (Signed)
1 Day Post-Op Procedure(s) (LRB): MEDIAN STERNOTOMY (N/A) RESECTION OF A THYMOMA (N/A) Subjective: Complains of pain CXR wet ,  Neo for BP at 60 mcgs O2 sats 100 % on 4 L Objective: Vital signs in last 24 hours: Temp:  [97.6 F (36.4 C)-102.8 F (39.3 C)] 98.5 F (36.9 C) (01/03 0811) Pulse Rate:  [94-121] 99 (01/03 0800) Cardiac Rhythm:  [-] Sinus tachycardia (01/03 0800) Resp:  [0-47] 37 (01/03 0800) BP: (66-135)/(26-79) 75/50 mmHg (01/03 0800) SpO2:  [96 %-100 %] 100 % (01/03 0800) Arterial Line BP: (84-132)/(39-77) 97/54 mmHg (01/03 0800) FiO2 (%):  [50 %] 50 % (01/02 1848) Weight:  [341 lb (154.677 kg)-348 lb 5.2 oz (158 kg)] 348 lb 5.2 oz (158 kg) (01/03 0500)  Hemodynamic parameters for last 24 hours:  nsr Fever last pm 102 resolved  Intake/Output from previous day: 01/02 0701 - 01/03 0700 In: 8257.5 [I.V.:6377.5; NG/GT:30; IV Piggyback:1850] Out: 3945 [Urine:2645; Blood:650; Chest Tube:650] Intake/Output this shift: Total I/O In: 233.3 [I.V.:233.3] Out: 55 [Urine:55]  EXAM distant breath sounds edematous  Lab Results:  Recent Labs  01/12/13 1357  01/12/13 1949 01/13/13 0330  WBC 19.3*  --   --  15.8*  HGB 12.2  < > 13.3 10.7*  HCT 35.6*  < > 39.0 30.8*  PLT 271  --   --  273  < > = values in this interval not displayed. BMET:  Recent Labs  01/12/13 1357  01/12/13 1949 01/13/13 0330  NA 140  < > 138 141  K 5.1  < > 4.5 4.8  CL 103  --  105 104  CO2 25  --   --  26  GLUCOSE 359*  < > 261* 100*  BUN 17  --  15 20  CREATININE 0.77  --  0.90 1.24*  CALCIUM 8.7  --   --  8.2*  < > = values in this interval not displayed.  PT/INR:  Recent Labs  01/12/13 1357  LABPROT 13.7  INR 1.07   ABG    Component Value Date/Time   PHART 7.347* 01/12/2013 1950   HCO3 22.8 01/12/2013 1950   TCO2 24 01/12/2013 1950   ACIDBASEDEF 3.0* 01/12/2013 1950   O2SAT 97.0 01/12/2013 1950   CBG (last 3)   Recent Labs  01/13/13 0055 01/13/13 0201 01/13/13 0320   GLUCAP 119* 117* 114*    Assessment/Plan: S/P Procedure(s) (LRB): MEDIAN STERNOTOMY (N/A) RESECTION OF A THYMOMA (N/A)   Mobilize to chair Transition off insulin drip Wean neo, gentle diuresis leave ant MT and L PT   LOS: 1 day    VAN TRIGT [name],Kaitlin Jares 01/13/2013

## 2013-01-14 ENCOUNTER — Inpatient Hospital Stay (HOSPITAL_COMMUNITY): Payer: BC Managed Care – PPO

## 2013-01-14 DIAGNOSIS — E1165 Type 2 diabetes mellitus with hyperglycemia: Secondary | ICD-10-CM

## 2013-01-14 DIAGNOSIS — IMO0001 Reserved for inherently not codable concepts without codable children: Secondary | ICD-10-CM

## 2013-01-14 LAB — BASIC METABOLIC PANEL
BUN: 20 mg/dL (ref 6–23)
CO2: 24 mEq/L (ref 19–32)
Calcium: 8.9 mg/dL (ref 8.4–10.5)
Chloride: 101 mEq/L (ref 96–112)
Creatinine, Ser: 0.84 mg/dL (ref 0.50–1.10)
GFR calc Af Amer: >90 mL/min (ref >90)
GFR calc non Af Amer: 83 mL/min — ABNORMAL LOW (ref >90)
Glucose, Bld: 163 mg/dL — ABNORMAL HIGH (ref 70–99)
Potassium: 4.1 mEq/L (ref 3.7–5.3)
Sodium: 138 mEq/L (ref 137–147)

## 2013-01-14 LAB — GLUCOSE, CAPILLARY
GLUCOSE-CAPILLARY: 116 mg/dL — AB (ref 70–99)
GLUCOSE-CAPILLARY: 124 mg/dL — AB (ref 70–99)
GLUCOSE-CAPILLARY: 131 mg/dL — AB (ref 70–99)
GLUCOSE-CAPILLARY: 133 mg/dL — AB (ref 70–99)
Glucose-Capillary: 79 mg/dL (ref 70–99)
Glucose-Capillary: 123 mg/dL — ABNORMAL HIGH (ref 70–99)
Glucose-Capillary: 136 mg/dL — ABNORMAL HIGH (ref 70–99)
Glucose-Capillary: 123 mg/dL — ABNORMAL HIGH (ref 70–99)
Glucose-Capillary: 151 mg/dL — ABNORMAL HIGH (ref 70–99)
Glucose-Capillary: 147 mg/dL — ABNORMAL HIGH (ref 70–99)
Glucose-Capillary: 203 mg/dL — ABNORMAL HIGH (ref 70–99)
Glucose-Capillary: 223 mg/dL — ABNORMAL HIGH (ref 70–99)
Glucose-Capillary: 218 mg/dL — ABNORMAL HIGH (ref 70–99)

## 2013-01-14 LAB — CBC
HCT: 30.3 % — ABNORMAL LOW (ref 36.0–46.0)
Hemoglobin: 10.6 g/dL — ABNORMAL LOW (ref 12.0–15.0)
MCH: 26.2 pg (ref 26.0–34.0)
MCHC: 35 g/dL (ref 30.0–36.0)
MCV: 74.8 fL — ABNORMAL LOW (ref 78.0–100.0)
Platelets: 250 10*3/uL (ref 150–400)
RBC: 4.05 MIL/uL (ref 3.87–5.11)
RDW: 16.5 % — ABNORMAL HIGH (ref 11.5–15.5)
WBC: 16.9 10*3/uL — ABNORMAL HIGH (ref 4.0–10.5)

## 2013-01-14 MED ORDER — POTASSIUM CHLORIDE 10 MEQ/50ML IV SOLN
10.0000 meq | INTRAVENOUS | Status: AC
  Administered 2013-01-14 (×2): 10 meq via INTRAVENOUS
  Filled 2013-01-14 (×2): qty 50

## 2013-01-14 MED ORDER — FUROSEMIDE 10 MG/ML IJ SOLN: 20.0000 mg | Freq: Two times a day (BID) | INTRAMUSCULAR | Status: DC

## 2013-01-14 MED ORDER — FUROSEMIDE 10 MG/ML IJ SOLN
20.0000 mg | Freq: Two times a day (BID) | INTRAMUSCULAR | Status: DC
  Administered 2013-01-14 – 2013-01-16 (×6): 20 mg via INTRAVENOUS
  Filled 2013-01-14 (×8): qty 2

## 2013-01-14 MED ORDER — ENOXAPARIN SODIUM 40 MG/0.4ML ~~LOC~~ SOLN
40.0000 mg | SUBCUTANEOUS | Status: DC
  Administered 2013-01-14 – 2013-01-16 (×3): 40 mg via SUBCUTANEOUS
  Filled 2013-01-14 (×5): qty 0.4

## 2013-01-14 MED ORDER — INSULIN DETEMIR 100 UNIT/ML ~~LOC~~ SOLN
30.0000 [IU] | Freq: Two times a day (BID) | SUBCUTANEOUS | Status: DC
  Administered 2013-01-14 – 2013-01-15 (×4): 30 [IU] via SUBCUTANEOUS
  Filled 2013-01-14 (×6): qty 0.3

## 2013-01-14 NOTE — Progress Notes (Signed)
2 Days Post-Op Procedure(s) (LRB): MEDIAN STERNOTOMY (N/A) RESECTION OF A THYMOMA (N/A) Subjective:   Progressing after sternotomy and resection of large aggressive thymoma Out of bed to chair but extremely weak and requiring much assist-will get PT consult Chest x-ray with improved edema and aeration with IV Lasix Neo-Synephrine weaned with good blood pressure this morning\ Minimal chest tube drainage we'll remove all tubes except for right pleural tube because right upper lobe wedge resection-no airleak this morning Blood sugars difficult to control, increasing doses of Lantus given and split doses 30 units twice a day Objective: Vital signs in last 24 hours: Temp:  [98.1 F (36.7 C)-98.7 F (37.1 C)] 98.7 F (37.1 C) (01/04 0800) Pulse Rate:  [87-124] 124 (01/04 0700) Cardiac Rhythm:  [-] Sinus tachycardia (01/03 2000) Resp:  [11-35] 28 (01/04 0700) BP: (52-103)/(33-59) 92/50 mmHg (01/04 0500) SpO2:  [90 %-100 %] 92 % (01/04 0700) Arterial Line BP: (91-134)/(47-69) 91/49 mmHg (01/04 0700) Weight:  [350 lb 1.5 oz (158.8 kg)] 350 lb 1.5 oz (158.8 kg) (01/04 0700)  Hemodynamic parameters for last 24 hours:   sinus tachycardia on low-dose oral metoprolol while Neo-Synephrine being weaned  Intake/Output from previous day: 01/03 0701 - 01/04 0700 In: 3934.6 [P.O.:1920; I.V.:1914.6; IV Piggyback:100] Out: 2590 [Urine:2235; Chest Tube:355] Intake/Output this shift:    Exam Alert responsive Breath sounds remote No air leak from chest tubes Extremities with mild edema but warm  Lab Results:  Recent Labs  01/13/13 1700 01/13/13 1712 01/14/13 0400  WBC 20.7*  --  16.9*  HGB 10.7* 10.9* 10.6*  HCT 31.9* 32.0* 30.3*  PLT 250  --  250   BMET:  Recent Labs  01/13/13 0330  01/13/13 1712 01/14/13 0400  NA 141  --  140 138  K 4.8  --  4.5 4.1  CL 104  --  103 101  CO2 26  --   --  24  GLUCOSE 100*  --  174* 163*  BUN 20  --  20 20  CREATININE 1.24*  < > 1.00 0.84   CALCIUM 8.2*  --   --  8.9  < > = values in this interval not displayed.  PT/INR:  Recent Labs  01/12/13 1357  LABPROT 13.7  INR 1.07   ABG    Component Value Date/Time   PHART 7.347* 01/12/2013 1950   HCO3 22.8 01/12/2013 1950   TCO2 24 01/13/2013 1712   ACIDBASEDEF 3.0* 01/12/2013 1950   O2SAT 97.0 01/12/2013 1950   CBG (last 3)   Recent Labs  01/14/13 0031 01/14/13 0349 01/14/13 0804  GLUCAP 131* 147* 203*    Assessment/Plan: S/P Procedure(s) (LRB): MEDIAN STERNOTOMY (N/A) RESECTION OF A THYMOMA (N/A)  Remove mediastinal and left pleural chest tube Continue gentle diuresis Physical therapy consult, removed radial a line Diabetic control with split dosing of Lantus insulin and sliding scale  LOS: 2 days    VAN TRIGT III,PETER 01/14/2013

## 2013-01-14 NOTE — Progress Notes (Signed)
CT surgery PM rounds  Patient examined and record reviewed.Hemodynamics stable,labs satisfactory.Patient had stable day.Continue current care. VAN TRIGT III,Shanera Meske 01/14/2013

## 2013-01-15 ENCOUNTER — Encounter (HOSPITAL_COMMUNITY): Payer: Self-pay | Admitting: Surgery

## 2013-01-15 ENCOUNTER — Inpatient Hospital Stay (HOSPITAL_COMMUNITY): Payer: BC Managed Care – PPO

## 2013-01-15 LAB — GLUCOSE, CAPILLARY
GLUCOSE-CAPILLARY: 223 mg/dL — AB (ref 70–99)
GLUCOSE-CAPILLARY: 183 mg/dL — AB (ref 70–99)
GLUCOSE-CAPILLARY: 412 mg/dL — AB (ref 70–99)
GLUCOSE-CAPILLARY: 326 mg/dL — AB (ref 70–99)
GLUCOSE-CAPILLARY: 287 mg/dL — AB (ref 70–99)
GLUCOSE-CAPILLARY: 266 mg/dL — AB (ref 70–99)
GLUCOSE-CAPILLARY: 263 mg/dL — AB (ref 70–99)
Glucose-Capillary: 176 mg/dL — ABNORMAL HIGH (ref 70–99)
Glucose-Capillary: 225 mg/dL — ABNORMAL HIGH (ref 70–99)
Glucose-Capillary: 294 mg/dL — ABNORMAL HIGH (ref 70–99)
Glucose-Capillary: 358 mg/dL — ABNORMAL HIGH (ref 70–99)
Glucose-Capillary: 394 mg/dL — ABNORMAL HIGH (ref 70–99)
Glucose-Capillary: 438 mg/dL — ABNORMAL HIGH (ref 70–99)

## 2013-01-15 LAB — BASIC METABOLIC PANEL
BUN: 23 mg/dL (ref 6–23)
CO2: 28 mEq/L (ref 19–32)
Calcium: 8.9 mg/dL (ref 8.4–10.5)
Chloride: 103 mEq/L (ref 96–112)
Creatinine, Ser: 0.91 mg/dL (ref 0.50–1.10)
GFR calc Af Amer: 87 mL/min — ABNORMAL LOW (ref >90)
GFR calc non Af Amer: 75 mL/min — ABNORMAL LOW (ref >90)
Glucose, Bld: 164 mg/dL — ABNORMAL HIGH (ref 70–99)
Potassium: 4.8 mEq/L (ref 3.7–5.3)
Sodium: 142 mEq/L (ref 137–147)

## 2013-01-15 LAB — CBC
HCT: 30.1 % — ABNORMAL LOW (ref 36.0–46.0)
Hemoglobin: 10.2 g/dL — ABNORMAL LOW (ref 12.0–15.0)
MCH: 25.8 pg — ABNORMAL LOW (ref 26.0–34.0)
MCHC: 33.9 g/dL (ref 30.0–36.0)
MCV: 76.2 fL — ABNORMAL LOW (ref 78.0–100.0)
Platelets: 280 10*3/uL (ref 150–400)
RBC: 3.95 MIL/uL (ref 3.87–5.11)
RDW: 16 % — ABNORMAL HIGH (ref 11.5–15.5)
WBC: 13.6 10*3/uL — ABNORMAL HIGH (ref 4.0–10.5)

## 2013-01-15 MED ORDER — INSULIN REGULAR BOLUS VIA INFUSION: 0.0000 [IU] | Freq: Three times a day (TID) | INTRAVENOUS | Status: DC

## 2013-01-15 MED ORDER — DEXTROSE 50 % IV SOLN: 25.0000 mL | INTRAVENOUS | Status: DC | PRN

## 2013-01-15 MED ORDER — FUROSEMIDE 10 MG/ML IJ SOLN
20.0000 mg | Freq: Once | INTRAMUSCULAR | Status: AC
  Administered 2013-01-15: 20 mg via INTRAVENOUS
  Filled 2013-01-15: qty 2

## 2013-01-15 MED ORDER — HYDROCOD POLST-CHLORPHEN POLST 10-8 MG/5ML PO LQCR
5.0000 mL | Freq: Two times a day (BID) | ORAL | Status: DC | PRN
  Administered 2013-01-15 – 2013-01-23 (×15): 5 mL via ORAL
  Filled 2013-01-15 (×16): qty 5

## 2013-01-15 MED ORDER — METFORMIN HCL 850 MG PO TABS
850.0000 mg | ORAL_TABLET | Freq: Two times a day (BID) | ORAL | Status: DC
  Administered 2013-01-15 – 2013-01-16 (×2): 850 mg via ORAL
  Filled 2013-01-15 (×4): qty 1

## 2013-01-15 MED ORDER — PANTOPRAZOLE SODIUM 40 MG IV SOLR
40.0000 mg | Freq: Every day | INTRAVENOUS | Status: DC
  Administered 2013-01-15: 40 mg via INTRAVENOUS
  Filled 2013-01-15 (×2): qty 40

## 2013-01-15 MED ORDER — METHYLPREDNISOLONE SODIUM SUCC 125 MG IJ SOLR
125.0000 mg | Freq: Four times a day (QID) | INTRAMUSCULAR | Status: DC
  Administered 2013-01-15: 125 mg via INTRAVENOUS
  Filled 2013-01-15 (×2): qty 2

## 2013-01-15 MED ORDER — DEXTROSE-NACL 5-0.45 % IV SOLN
INTRAVENOUS | Status: DC
  Administered 2013-01-15: 20:00:00 via INTRAVENOUS

## 2013-01-15 MED ORDER — PANTOPRAZOLE SODIUM 40 MG PO TBEC
40.0000 mg | DELAYED_RELEASE_TABLET | Freq: Every day | ORAL | Status: DC
  Administered 2013-01-15 – 2013-01-24 (×10): 40 mg via ORAL
  Filled 2013-01-15 (×12): qty 1

## 2013-01-15 MED ORDER — LEVALBUTEROL HCL 1.25 MG/0.5ML IN NEBU
1.2500 mg | INHALATION_SOLUTION | Freq: Four times a day (QID) | RESPIRATORY_TRACT | Status: DC
  Administered 2013-01-15 (×3): 1.25 mg via RESPIRATORY_TRACT
  Filled 2013-01-15 (×4): qty 0.5

## 2013-01-15 MED ORDER — GUAIFENESIN ER 600 MG PO TB12
600.0000 mg | ORAL_TABLET | Freq: Two times a day (BID) | ORAL | Status: DC
  Administered 2013-01-15 – 2013-01-24 (×19): 600 mg via ORAL
  Filled 2013-01-15 (×22): qty 1

## 2013-01-15 MED ORDER — SODIUM CHLORIDE 0.45 % IV SOLN
INTRAVENOUS | Status: DC
Start: 2013-01-15 — End: 2013-01-17

## 2013-01-15 MED ORDER — INSULIN ASPART 100 UNIT/ML ~~LOC~~ SOLN
10.0000 [IU] | Freq: Three times a day (TID) | SUBCUTANEOUS | Status: DC
  Administered 2013-01-15 – 2013-01-20 (×12): 10 [IU] via SUBCUTANEOUS

## 2013-01-15 MED ORDER — LEVALBUTEROL HCL 1.25 MG/0.5ML IN NEBU
1.2500 mg | INHALATION_SOLUTION | Freq: Four times a day (QID) | RESPIRATORY_TRACT | Status: DC
  Filled 2013-01-15 (×5): qty 0.5

## 2013-01-15 MED ORDER — INSULIN ASPART 100 UNIT/ML ~~LOC~~ SOLN
6.0000 [IU] | Freq: Three times a day (TID) | SUBCUTANEOUS | Status: DC
  Administered 2013-01-15 (×2): 6 [IU] via SUBCUTANEOUS

## 2013-01-15 MED ORDER — SODIUM CHLORIDE 0.9 % IV SOLN
INTRAVENOUS | Status: DC
  Administered 2013-01-15: 2.3 [IU]/h via INTRAVENOUS
  Administered 2013-01-16: 6.1 [IU]/h via INTRAVENOUS
  Filled 2013-01-15 (×2): qty 1

## 2013-01-15 MED ORDER — LEVALBUTEROL HCL 1.25 MG/0.5ML IN NEBU
1.2500 mg | INHALATION_SOLUTION | Freq: Three times a day (TID) | RESPIRATORY_TRACT | Status: DC
  Administered 2013-01-16 – 2013-01-19 (×9): 1.25 mg via RESPIRATORY_TRACT
  Filled 2013-01-15 (×13): qty 0.5

## 2013-01-15 NOTE — Progress Notes (Signed)
[facility] BRIEF SICU PROGRESS NOTE  3 Days Post-Op  S/P Procedure(s) (LRB): MEDIAN STERNOTOMY (N/A) RESECTION OF A THYMOMA (N/A)   Stable day Breathing comfortably CBG's still elevated despite extra insulin  Plan: Restart insulin drip   OWEN,CLARENCE H 01/15/2013 9:13 PM

## 2013-01-15 NOTE — Progress Notes (Signed)
NUTRITION FOLLOW UP  Intervention:    No nutrition intervention at this time --- patient declined  RD to follow for nutrition care plan  Nutrition Dx:   Inadequate oral intake now related to dislike of food as evidenced by patient report, ongoing  New Goal:   Pt to meet >/= 90% of their estimated nutrition needs, progressing  Monitor:   PO intake, weight, labs, I/O's  Assessment:   Patient with PMH of DM, HTN and PNA; presented for surgery after CT scan showed large anterior mediastinal mass.   Patient s/p procedures 01/12/13:  MEDIAN STERNOTOMY  RESECTION OF THYMOMA  Patient extubated 1/2.  Advanced to Clear Liquids 1/3, Carbohydrate Modified Medium Calorie 1/4.  Patient eating breakfast upon RD visit.  Reports her appetite is fair.  Doesn't really care for the hospital food.  Declined addition of nutrition supplements at this time.  Height: Ht Readings from Last 1 Encounters:  01/12/13 5\' 9"  (1.753 m)    Weight Status:   Wt Readings from Last 1 Encounters:  01/15/13 347 lb 10.7 oz (157.7 kg)    Re-estimated needs:  Kcal: 1800-2000 Protein: 90-100 gm Fluid: 1.8-2.0 L  Skin: chest surgical incision   Diet Order: Carb Control   Intake/Output Summary (Last 24 hours) at 01/15/13 1302 Last data filed at 01/15/13 1200  Gross per 24 hour  Intake    380 ml  Output   3035 ml  Net  -2655 ml    Labs:   Recent Labs Lab 01/12/13 1949 01/13/13 0330 01/13/13 1700 01/13/13 1712 01/14/13 0400 01/15/13 0402  NA 138 141  --  140 138 142  K 4.5 4.8  --  4.5 4.1 4.8  CL 105 104  --  103 101 103  CO2  --  26  --   --  24 28  BUN 15 20  --  20 20 23   CREATININE 0.90 1.24* 0.95 1.00 0.84 0.91  CALCIUM  --  8.2*  --   --  8.9 8.9  MG  --  1.6 1.9  --   --   --   GLUCOSE 261* 100*  --  174* 163* 164*    CBG (last 3)   Recent Labs  01/15/13 0437 01/15/13 0812 01/15/13 1156  GLUCAP 183* 225* 438*    Scheduled Meds: . acetaminophen  1,000 mg Oral Q6H   Or   . acetaminophen (TYLENOL) oral liquid 160 mg/5 mL  1,000 mg Per Tube Q6H  . bisacodyl  10 mg Oral Daily   Or  . bisacodyl  10 mg Rectal Daily  . docusate sodium  200 mg Oral Daily  . enoxaparin (LOVENOX) injection  40 mg Subcutaneous Q24H  . furosemide  20 mg Intravenous BID  . guaiFENesin  600 mg Oral BID  . insulin aspart  0-24 Units Subcutaneous Q4H  . insulin aspart  6 Units Subcutaneous TID WC  . insulin detemir  30 Units Subcutaneous BID  . levalbuterol  1.25 mg Nebulization Q6H  . metoprolol tartrate  12.5 mg Oral BID   Or  . metoprolol tartrate  12.5 mg Per Tube BID  . pantoprazole  40 mg Oral Daily  . sodium chloride  3 mL Intravenous Q12H    Continuous Infusions: . sodium chloride 20 mL/hr at 01/14/13 1900  . sodium chloride      Arthur Holms, RD, LDN Pager #: [phone] After-Hours Pager #: [phone]

## 2013-01-15 NOTE — Progress Notes (Addendum)
3 Days Post-Op Procedure(s) (LRB): MEDIAN STERNOTOMY (N/A) RESECTION OF A THYMOMA (N/A) Subjective: Sore especially with coughing  Objective: Vital signs in last 24 hours: Temp:  [98.7 F (37.1 C)-100.1 F (37.8 C)] 100.1 F (37.8 C) (01/05 0400) Pulse Rate:  [106-129] 120 (01/05 0700) Cardiac Rhythm:  [-] Sinus tachycardia (01/05 0400) Resp:  [16-30] 22 (01/05 0700) BP: (98-134)/(49-99) 107/77 mmHg (01/05 0700) SpO2:  [90 %-97 %] 97 % (01/05 0700) Arterial Line BP: (93-106)/(49-62) 106/61 mmHg (01/04 1000) Weight:  [157.7 kg (347 lb 10.7 oz)] 157.7 kg (347 lb 10.7 oz) (01/05 0600)  Hemodynamic parameters for last 24 hours:    Intake/Output from previous day: 01/04 0701 - 01/05 0700 In: 495.1 [I.V.:495.1] Out: 2960 [Urine:2900; Chest Tube:60] Intake/Output this shift:    General appearance: alert and cooperative Heart: regular rate and rhythm, S1, S2 normal, no murmur, click, rub or gallop Lungs: slight wheeze bilat Extremities: edema mild pedal edema Wound: dressing dry  Lab Results:  Recent Labs  01/14/13 0400 01/15/13 0402  WBC 16.9* 13.6*  HGB 10.6* 10.2*  HCT 30.3* 30.1*  PLT 250 280   BMET:  Recent Labs  01/14/13 0400 01/15/13 0402  NA 138 142  K 4.1 4.8  CL 101 103  CO2 24 28  GLUCOSE 163* 164*  BUN 20 23  CREATININE 0.84 0.91  CALCIUM 8.9 8.9    PT/INR:  Recent Labs  01/12/13 1357  LABPROT 13.7  INR 1.07   ABG    Component Value Date/Time   PHART 7.347* 01/12/2013 1950   HCO3 22.8 01/12/2013 1950   TCO2 24 01/13/2013 1712   ACIDBASEDEF 3.0* 01/12/2013 1950   O2SAT 97.0 01/12/2013 1950   CBG (last 3)   Recent Labs  01/14/13 2040 01/15/13 0038 01/15/13 0437  GLUCAP 218* 176* 183*   CXR: clear  Assessment/Plan: S/P Procedure(s) (LRB): MEDIAN STERNOTOMY (N/A) RESECTION OF A THYMOMA (N/A) She is hemodynamically stable Will remove right pleural tube today. Work on IS Needs to ambulate. PT consult ordered. Morbid obesity is going  to slow her progress. Wt is still above preop and she has some edema. Continue diuresis. Diabetes: Glucose still a little high. Continue Levemir bid and increase meal coverage Novolog. Continue SCD's for DVT prophylaxis and start Lovenox. Already started on Lovenox yesterday.   LOS: 3 days    Shafter Jupin K 01/15/2013

## 2013-01-15 NOTE — Progress Notes (Signed)
Inpatient Diabetes Program Recommendations  AACE/ADA: New Consensus Statement on Inpatient Glycemic Control (2013)  Target Ranges:  Prepandial:   less than 140 mg/dL      Peak postprandial:   less than 180 mg/dL (1-2 hours)      Critically ill patients:  140 - 180 mg/dL  Results for YOSELINE, ANDERSSON (MRN [id number]) as of 01/15/2013 10:56  Ref. Range 01/14/2013 08:04 01/14/2013 12:11 01/14/2013 16:02 01/14/2013 20:40 01/15/2013 00:38 01/15/2013 04:37 01/15/2013 08:12  Glucose-Capillary Latest Range: 70-99 mg/dL 203 (H) 223 (H) 223 (H) 218 (H) 176 (H) 183 (H) 225 (H)   Inpatient Diabetes Program Recommendations Insulin - Basal: Please consider increasing Levemir to 32 units BID. Correction (SSI): Please consider changing frequency of CBGs and Novolog correction to Resistant scale ACHS since patient is now ordered a diet. Insulin - Meal Coverage: Please consider increasing Novolog meal coverage to 10 units TID with meals. HgbA1C: Please consider ordering an A1C to evaluate glycemic control over the past 2-3 months. Thanks, Barnie Alderman, RN, MSN, CCRN Diabetes Coordinator Inpatient Diabetes Program [phone] (Team Pager) [phone] ([facility] office) [phone] [facility] office)

## 2013-01-15 NOTE — Evaluation (Signed)
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 Anticipate steady progress that may be slow due to morbid obesity. Patient will benefit from skilled PT to increase their independence and safety with mobility to allow discharge to the venue listed below.       PT Assessment  Patient needs continued PT services    Follow Up Recommendations  Home health PT;Supervision - Intermittent    Does the patient have the potential to tolerate intense rehabilitation      Barriers to Discharge Decreased caregiver support husband s/p chemo; family assist likely intermittent by pt's mother and older daughters    Equipment Recommendations  None recommended by PT    Recommendations for Other Services OT consult   Frequency Min 3X/week    Precautions / Restrictions Precautions Precautions: Sternal Precaution Comments: pt able to state sternal precautions; cues to adhere to precautions   Pertinent Vitals/Pain SaO2 on RA at rest 94%                      Standing decr to 84%, not increased with pursed lip breathing                      Seated rest 91-94% on RA Pt reported minimal pain in chest--mostly due to coughing      Mobility  Bed Mobility Bed Mobility: Sit to Supine Sit to Supine: 1: +2 Total assist;HOB flat Sit to Supine: Patient Percentage: 70% Details for Bed Mobility Assistance: mostly assist required for raising her legs onto bed; vc for technique to scoot laterally to align herself in bed  (with min assist at hips via draw sheet) Transfers Transfers: Sit to Stand;Stand to Sit Sit to Stand: 4: Min assist;From elevated surface;Without upper extremity assist Stand to Sit: 4: Min guard;To elevated surface;Without upper extremity assist Details for Transfer Assistance: RN assisted for safety and with lines/tubes Ambulation/Gait Ambulation/Gait Assistance: 4: Min assist;Other (comment) (+2 for lines/safety) Ambulation Distance (Feet): 3 Feet Assistive device: None Ambulation/Gait Assistance Details: steady assist due to reports of legs "feeling rubbery" and decr SaO2    Exercises     PT Diagnosis: Difficulty walking;Acute pain  PT Problem List: Decreased activity tolerance;Decreased balance;Decreased mobility;Decreased knowledge of use of DME;Decreased knowledge of precautions;Cardiopulmonary status limiting activity;Obesity;Pain PT Treatment Interventions: DME instruction;Gait training;Functional mobility training;Therapeutic activities;Patient/family education     PT Goals(Current goals can be found in the care plan section) Acute Rehab PT Goals Patient Stated Goal: be able to care for herself PT Goal Formulation: With patient Time For Goal Achievement: 01/22/13 Potential to Achieve Goals: Good  Visit Information  Last PT Received On: 01/15/13 Assistance Needed: +2 History of Present Illness: Adm for resection (via median sternotomy) of 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).PMHx includes morbid obesity, DM, recent pna       Prior [place] expects to be discharged to:: Private residence Living Arrangements: Spouse/significant other;Children (son 29 yo) Available Help at Discharge: Family Type of Home: Apartment Home Access: Level  entry Home Layout: One level Home Equipment: Other (comment) (lift chair) Additional Comments: husband s/p recent chemotherapy; daughters and mother local and can  assist Prior Function Level of Independence: Independent Communication Communication: No difficulties    Cognition  Cognition Arousal/Alertness: Awake/alert Behavior During Therapy: WFL for tasks assessed/performed Overall Cognitive Status: Within Functional Limits for tasks assessed    Extremity/Trunk Assessment Upper Extremity Assessment Upper Extremity Assessment: Overall WFL for tasks assessed Lower Extremity Assessment Lower Extremity Assessment: Overall WFL for tasks assessed Cervical / Trunk Assessment Cervical / Trunk Assessment: Normal   Balance Balance Balance Assessed: Yes Static Standing Balance Static Standing - Balance Support: No upper extremity supported Static Standing - Level of Assistance: 5: Stand by assistance Static Standing - Comment/# of Minutes: 60 seconds while instructed in pursed lip breathing due to decr SaO2 on RA  End of Session PT - End of Session Activity Tolerance: Treatment limited secondary to medical complications (Comment) (decr SaO2 with activity) Patient left: in bed;with call bell/phone within reach;with nursing/sitter in room Nurse Communication: Mobility status  GP     Vraj Denardo 01/15/2013, 12:11 PM Pager [phone]

## 2013-01-16 ENCOUNTER — Inpatient Hospital Stay (HOSPITAL_COMMUNITY): Payer: BC Managed Care – PPO

## 2013-01-16 LAB — TYPE AND SCREEN
ABO/RH(D): A POS
Antibody Screen: NEGATIVE
Unit division: 0
Unit division: 0
Unit division: 0
Unit division: 0

## 2013-01-16 LAB — GLUCOSE, CAPILLARY
GLUCOSE-CAPILLARY: 126 mg/dL — AB (ref 70–99)
GLUCOSE-CAPILLARY: 136 mg/dL — AB (ref 70–99)
GLUCOSE-CAPILLARY: 151 mg/dL — AB (ref 70–99)
GLUCOSE-CAPILLARY: 174 mg/dL — AB (ref 70–99)
GLUCOSE-CAPILLARY: 231 mg/dL — AB (ref 70–99)
GLUCOSE-CAPILLARY: 58 mg/dL — AB (ref 70–99)
GLUCOSE-CAPILLARY: 72 mg/dL (ref 70–99)
Glucose-Capillary: 109 mg/dL — ABNORMAL HIGH (ref 70–99)
Glucose-Capillary: 116 mg/dL — ABNORMAL HIGH (ref 70–99)
Glucose-Capillary: 128 mg/dL — ABNORMAL HIGH (ref 70–99)
Glucose-Capillary: 132 mg/dL — ABNORMAL HIGH (ref 70–99)
Glucose-Capillary: 143 mg/dL — ABNORMAL HIGH (ref 70–99)
Glucose-Capillary: 154 mg/dL — ABNORMAL HIGH (ref 70–99)
Glucose-Capillary: 155 mg/dL — ABNORMAL HIGH (ref 70–99)
Glucose-Capillary: 175 mg/dL — ABNORMAL HIGH (ref 70–99)
Glucose-Capillary: 206 mg/dL — ABNORMAL HIGH (ref 70–99)
Glucose-Capillary: 48 mg/dL — ABNORMAL LOW (ref 70–99)
Glucose-Capillary: 79 mg/dL (ref 70–99)
Glucose-Capillary: 91 mg/dL (ref 70–99)
Glucose-Capillary: 97 mg/dL (ref 70–99)

## 2013-01-16 MED ORDER — METFORMIN HCL 500 MG PO TABS
1000.0000 mg | ORAL_TABLET | Freq: Two times a day (BID) | ORAL | Status: DC
  Administered 2013-01-16 – 2013-01-24 (×16): 1000 mg via ORAL
  Filled 2013-01-16 (×18): qty 2

## 2013-01-16 MED ORDER — INSULIN ASPART 100 UNIT/ML ~~LOC~~ SOLN
0.0000 [IU] | SUBCUTANEOUS | Status: DC
  Administered 2013-01-16: 7 [IU] via SUBCUTANEOUS
  Administered 2013-01-16 – 2013-01-17 (×3): 4 [IU] via SUBCUTANEOUS
  Administered 2013-01-17: 3 [IU] via SUBCUTANEOUS
  Administered 2013-01-17: 4 [IU] via SUBCUTANEOUS
  Administered 2013-01-17: 7 [IU] via SUBCUTANEOUS

## 2013-01-16 MED ORDER — INSULIN DETEMIR 100 UNIT/ML ~~LOC~~ SOLN
40.0000 [IU] | Freq: Two times a day (BID) | SUBCUTANEOUS | Status: DC
  Administered 2013-01-16 – 2013-01-18 (×6): 40 [IU] via SUBCUTANEOUS
  Filled 2013-01-16 (×7): qty 0.4

## 2013-01-16 NOTE — Progress Notes (Signed)
4 Days Post-Op Procedure(s) (LRB): MEDIAN STERNOTOMY (N/A) RESECTION OF A THYMOMA (N/A) Subjective:  Sore from coughing overnight  Objective: Vital signs in last 24 hours: Temp:  [98.1 F (36.7 C)-98.5 F (36.9 C)] 98.1 F (36.7 C) (01/06 0812) Pulse Rate:  [100-120] 111 (01/06 0800) Cardiac Rhythm:  [-] Sinus tachycardia (01/06 0800) Resp:  [15-32] 16 (01/06 0800) BP: (93-125)/(54-97) 119/76 mmHg (01/06 0800) SpO2:  [85 %-100 %] 96 % (01/06 0843) Weight:  [158.5 kg (349 lb 6.9 oz)] 158.5 kg (349 lb 6.9 oz) (01/06 0500)  Hemodynamic parameters for last 24 hours:    Intake/Output from previous day: 01/05 0701 - 01/06 0700 In: 964.7 [P.O.:100; I.V.:864.7] Out: 2870 [Urine:2780; Chest Tube:90] Intake/Output this shift: Total I/O In: 43.4 [I.V.:43.4] Out: 60 [Urine:60]  General appearance: sleepy but reportedly did not sleep much overnight Heart: regular rate and rhythm, S1, S2 normal, no murmur, click, rub or gallop Lungs: slight wheeze Extremities: edema mild leg edema Wound: incision ok  Lab Results:  Recent Labs  01/14/13 0400 01/15/13 0402  WBC 16.9* 13.6*  HGB 10.6* 10.2*  HCT 30.3* 30.1*  PLT 250 280   BMET:  Recent Labs  01/14/13 0400 01/15/13 0402  NA 138 142  K 4.1 4.8  CL 101 103  CO2 24 28  GLUCOSE 163* 164*  BUN 20 23  CREATININE 0.84 0.91  CALCIUM 8.9 8.9    PT/INR: No results found for this basename: LABPROT, INR,  in the last 72 hours ABG    Component Value Date/Time   PHART 7.347* 01/12/2013 1950   HCO3 22.8 01/12/2013 1950   TCO2 24 01/13/2013 1712   ACIDBASEDEF 3.0* 01/12/2013 1950   O2SAT 97.0 01/12/2013 1950   CBG (last 3)   Recent Labs  01/16/13 0506 01/16/13 0631 01/16/13 0810  GLUCAP 109* 97 116*    Assessment/Plan: S/P Procedure(s) (LRB): MEDIAN STERNOTOMY (N/A) RESECTION OF A THYMOMA (N/A) Mobilize, IS. She requires a lot of attention due to morbid obesity so will keep in ICU. Diuresis Diabetes control: She had to  go back on insulin drip last night. According to her BS usually is in the 200's at home. Will increase Lantus and glucophage. Continue meal coverage and SSI. Wean off drip.   LOS: 4 days    Orange Hilligoss K 01/16/2013

## 2013-01-16 NOTE — Progress Notes (Signed)
Dr. Roxan Hockey aware of CBG 231. Will continue to check CBG q4 and give scheduled Lantus. If CBG >400 will restart insulin drip. Richarda Blade RN

## 2013-01-16 NOTE — Progress Notes (Signed)
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  Maintain sats in the 90's on [facility].  Mild dyspnea.  Expect will make steady progress from this point.   Follow Up Recommendations  Home health PT;Supervision - Intermittent     Does the patient have the potential to tolerate intense rehabilitation     Barriers to Discharge        Equipment Recommendations  None recommended by PT    Recommendations for Other Services    Frequency Min 3X/week   Progress towards PT Goals Progress towards PT goals: Progressing toward goals  Plan Current plan remains appropriate    Precautions / Restrictions Precautions Precautions: Sternal Precaution Comments: pt able to state sternal precautions; cues to adhere to precautions   Pertinent Vitals/Pain Stable vitals on 2L [place]    Mobility  Bed Mobility Bed Mobility: Not assessed Transfers Transfers: Sit to Stand;Stand to Sit Sit to Stand: 4: Min assist;From elevated surface;Without upper extremity assist Stand to Sit: 4: Min guard;To elevated surface;Without upper extremity assist Details for Transfer Assistance: cues for precautions, minimal assist to come forward Ambulation/Gait Ambulation/Gait Assistance: 4: Min guard Ambulation Distance (Feet): 120 Feet Assistive device:  (W/C to push and carry portable O2) Ambulation/Gait Assistance Details: Weakened gait with wide BOS, but generally steady.  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   Cognition  Cognition Arousal/Alertness: Awake/alert Behavior During Therapy: WFL for tasks assessed/performed Overall Cognitive Status: Within Functional Limits for tasks assessed    Balance  Balance Balance Assessed: Yes Static Standing Balance Static Standing - Balance Support: Bilateral upper extremity supported;During functional activity Static Standing - Level of Assistance: 5: Stand by assistance Static Standing - Comment/# of Minutes: 2-3 minutes total  End of Session PT - End of Session Equipment Utilized During Treatment: Oxygen Activity Tolerance: Patient tolerated treatment well Patient left: in chair;with call bell/phone within reach;with nursing/sitter in room Nurse Communication: Mobility status   GP     [name], Tessie Fass 01/16/2013, 12:13 PM 01/16/2013  Donnella Sham, [place] [phone]  (pager)

## 2013-01-16 NOTE — Progress Notes (Signed)
POD # 4 thymectomy  Up in chair  BP 95/64  Pulse 116  Temp(Src) 97.7 F (36.5 C) (Oral)  Resp 22  Ht 5\' 9"  (1.753 m)  Wt 349 lb 6.9 oz (158.5 kg)  BMI 51.58 kg/m2  SpO2 97%  LMP 06/17/2012   Intake/Output Summary (Last 24 hours) at 01/16/13 1830 Last data filed at 01/16/13 1700  Gross per 24 hour  Intake 826.37 ml  Output   1515 ml  Net -688.63 ml   CBG 72-175 so far today

## 2013-01-17 ENCOUNTER — Inpatient Hospital Stay (HOSPITAL_COMMUNITY): Payer: BC Managed Care – PPO

## 2013-01-17 LAB — CBC
HCT: 30.2 % — ABNORMAL LOW (ref 36.0–46.0)
Hemoglobin: 10.2 g/dL — ABNORMAL LOW (ref 12.0–15.0)
MCH: 25.4 pg — ABNORMAL LOW (ref 26.0–34.0)
MCHC: 33.8 g/dL (ref 30.0–36.0)
MCV: 75.3 fL — AB (ref 78.0–100.0)
PLATELETS: 350 10*3/uL (ref 150–400)
RBC: 4.01 MIL/uL (ref 3.87–5.11)
RDW: 16.1 % — AB (ref 11.5–15.5)
WBC: 15.1 10*3/uL — ABNORMAL HIGH (ref 4.0–10.5)

## 2013-01-17 LAB — GLUCOSE, CAPILLARY
GLUCOSE-CAPILLARY: 190 mg/dL — AB (ref 70–99)
GLUCOSE-CAPILLARY: 268 mg/dL — AB (ref 70–99)
Glucose-Capillary: 169 mg/dL — ABNORMAL HIGH (ref 70–99)
Glucose-Capillary: 139 mg/dL — ABNORMAL HIGH (ref 70–99)
Glucose-Capillary: 146 mg/dL — ABNORMAL HIGH (ref 70–99)
Glucose-Capillary: 221 mg/dL — ABNORMAL HIGH (ref 70–99)

## 2013-01-17 LAB — BASIC METABOLIC PANEL
BUN: 33 mg/dL — ABNORMAL HIGH (ref 6–23)
CALCIUM: 9.2 mg/dL (ref 8.4–10.5)
CO2: 30 mEq/L (ref 19–32)
CREATININE: 0.88 mg/dL (ref 0.50–1.10)
Chloride: 102 mEq/L (ref 96–112)
GFR calc Af Amer: >90 mL/min (ref >90)
GFR, EST NON AFRICAN AMERICAN: 78 mL/min — AB (ref >90)
GLUCOSE: 185 mg/dL — AB (ref 70–99)
Potassium: 4.9 mEq/L (ref 3.7–5.3)
Sodium: 140 mEq/L (ref 137–147)

## 2013-01-17 MED ORDER — ASPIRIN EC 325 MG PO TBEC
325.0000 mg | DELAYED_RELEASE_TABLET | Freq: Every day | ORAL | Status: DC
  Administered 2013-01-17 – 2013-01-24 (×8): 325 mg via ORAL
  Filled 2013-01-17 (×8): qty 1

## 2013-01-17 MED ORDER — ONDANSETRON HCL 4 MG PO TABS
4.0000 mg | ORAL_TABLET | Freq: Four times a day (QID) | ORAL | Status: DC | PRN
  Administered 2013-01-20 – 2013-01-23 (×4): 4 mg via ORAL
  Filled 2013-01-17 (×4): qty 1

## 2013-01-17 MED ORDER — INSULIN ASPART 100 UNIT/ML ~~LOC~~ SOLN
0.0000 [IU] | Freq: Three times a day (TID) | SUBCUTANEOUS | Status: DC
  Administered 2013-01-18 (×2): 3 [IU] via SUBCUTANEOUS
  Administered 2013-01-19 – 2013-01-20 (×4): 4 [IU] via SUBCUTANEOUS

## 2013-01-17 MED ORDER — LEVOFLOXACIN 750 MG PO TABS
750.0000 mg | ORAL_TABLET | Freq: Every day | ORAL | Status: DC
  Administered 2013-01-17 – 2013-01-24 (×8): 750 mg via ORAL
  Filled 2013-01-17 (×8): qty 1

## 2013-01-17 MED ORDER — ENOXAPARIN SODIUM 40 MG/0.4ML ~~LOC~~ SOLN
40.0000 mg | Freq: Two times a day (BID) | SUBCUTANEOUS | Status: DC
  Administered 2013-01-17 – 2013-01-24 (×15): 40 mg via SUBCUTANEOUS
  Filled 2013-01-17 (×21): qty 0.4

## 2013-01-17 MED ORDER — OXYCODONE HCL 5 MG PO TABS
5.0000 mg | ORAL_TABLET | ORAL | Status: DC | PRN
  Administered 2013-01-18 (×2): 10 mg via ORAL
  Administered 2013-01-18: 5 mg via ORAL
  Administered 2013-01-18 – 2013-01-24 (×26): 10 mg via ORAL
  Filled 2013-01-17 (×6): qty 2
  Filled 2013-01-17: qty 1
  Filled 2013-01-17 (×22): qty 2

## 2013-01-17 MED ORDER — BISACODYL 10 MG RE SUPP: 10.0000 mg | Freq: Every day | RECTAL | Status: DC | PRN

## 2013-01-17 MED ORDER — TRAMADOL HCL 50 MG PO TABS
50.0000 mg | ORAL_TABLET | ORAL | Status: DC | PRN
  Administered 2013-01-20: 50 mg via ORAL
  Filled 2013-01-17: qty 1

## 2013-01-17 MED ORDER — SODIUM CHLORIDE 0.9 % IJ SOLN
3.0000 mL | Freq: Two times a day (BID) | INTRAMUSCULAR | Status: DC
  Administered 2013-01-18 – 2013-01-24 (×10): 3 mL via INTRAVENOUS

## 2013-01-17 MED ORDER — DOCUSATE SODIUM 100 MG PO CAPS
200.0000 mg | ORAL_CAPSULE | Freq: Every day | ORAL | Status: DC
  Administered 2013-01-18 – 2013-01-24 (×7): 200 mg via ORAL
  Filled 2013-01-17 (×7): qty 2

## 2013-01-17 MED ORDER — METOPROLOL TARTRATE 12.5 MG HALF TABLET
12.5000 mg | ORAL_TABLET | Freq: Two times a day (BID) | ORAL | Status: DC
  Administered 2013-01-17: 12.5 mg via ORAL
  Filled 2013-01-17 (×3): qty 1

## 2013-01-17 MED ORDER — ONDANSETRON HCL 4 MG/2ML IJ SOLN
4.0000 mg | Freq: Four times a day (QID) | INTRAMUSCULAR | Status: DC | PRN
  Administered 2013-01-18: 4 mg via INTRAVENOUS
  Filled 2013-01-17: qty 2

## 2013-01-17 MED ORDER — BISACODYL 5 MG PO TBEC
10.0000 mg | DELAYED_RELEASE_TABLET | Freq: Every day | ORAL | Status: DC | PRN
  Administered 2013-01-18 – 2013-01-23 (×4): 10 mg via ORAL
  Filled 2013-01-17 (×4): qty 2

## 2013-01-17 MED ORDER — ACETAMINOPHEN 325 MG PO TABS: 650.0000 mg | ORAL_TABLET | Freq: Four times a day (QID) | ORAL | Status: DC | PRN

## 2013-01-17 MED ORDER — SODIUM CHLORIDE 0.9 % IJ SOLN
3.0000 mL | INTRAMUSCULAR | Status: DC | PRN
  Administered 2013-01-19: 3 mL via INTRAVENOUS

## 2013-01-17 MED ORDER — SODIUM CHLORIDE 0.9 % IV SOLN: 250.0000 mL | INTRAVENOUS | Status: DC | PRN

## 2013-01-17 NOTE — Progress Notes (Signed)
5 Days Post-Op Procedure(s) (LRB): MEDIAN STERNOTOMY (N/A) RESECTION OF A THYMOMA (N/A) Subjective: Cough and chest soreness  Objective: Vital signs in last 24 hours: Temp:  [97.7 F (36.5 C)-98.4 F (36.9 C)] 98.4 F (36.9 C) (01/07 0730) Pulse Rate:  [53-131] 107 (01/07 0700) Cardiac Rhythm:  [-] Sinus tachycardia (01/07 0700) Resp:  [14-23] 20 (01/07 0700) BP: (93-122)/(55-90) 107/86 mmHg (01/07 0700) SpO2:  [88 %-100 %] 99 % (01/07 0700)  Hemodynamic parameters for last 24 hours:    Intake/Output from previous day: 01/06 0701 - 01/07 0700 In: 506.7 [I.V.:506.7] Out: 1745 [Urine:1745] Intake/Output this shift:    General appearance: alert and cooperative Heart: regular rate and rhythm, S1, S2 normal, no murmur, click, rub or gallop Lungs: clear to auscultation bilaterally Extremities: edema mild pedal edema Wound: incision ok  Lab Results:  Recent Labs  01/15/13 0402 01/17/13 0400  WBC 13.6* 15.1*  HGB 10.2* 10.2*  HCT 30.1* 30.2*  PLT 280 350   BMET:  Recent Labs  01/15/13 0402 01/17/13 0400  NA 142 140  K 4.8 4.9  CL 103 102  CO2 28 30  GLUCOSE 164* 185*  BUN 23 33*  CREATININE 0.91 0.88  CALCIUM 8.9 9.2    PT/INR: No results found for this basename: LABPROT, INR,  in the last 72 hours ABG    Component Value Date/Time   PHART 7.347* 01/12/2013 1950   HCO3 22.8 01/12/2013 1950   TCO2 24 01/13/2013 1712   ACIDBASEDEF 3.0* 01/12/2013 1950   O2SAT 97.0 01/12/2013 1950   CBG (last 3)   Recent Labs  01/16/13 1951 01/16/13 2339 01/17/13 0406  GLUCAP 231* 155* 169*    Assessment/Plan: S/P Procedure(s) (LRB): MEDIAN STERNOTOMY (N/A) RESECTION OF A THYMOMA (N/A) Mobilize, IS Will increase Lovenox for DVT prophylaxis. Continue SCD's I don't see any sign of pneumonia. She could have some bronchitis. Will start Levaquin po. Diabetes control: CBG's better on current regimen.  Plan for transfer to step-down: see transfer orders   LOS: 5 days     BARTLE,BRYAN K 01/17/2013

## 2013-01-17 NOTE — Progress Notes (Signed)
Patient ID: Kaitlin [name], female   DOB: Nov 26, 1965, 48 y.o.   MRN: [id number]  SICU evening rounds:  Hemodynamically stable  Ambulated. Up in chair eating.  Awaiting bed on 2000

## 2013-01-18 LAB — GLUCOSE, CAPILLARY
Glucose-Capillary: 122 mg/dL — ABNORMAL HIGH (ref 70–99)
Glucose-Capillary: 196 mg/dL — ABNORMAL HIGH (ref 70–99)
Glucose-Capillary: 132 mg/dL — ABNORMAL HIGH (ref 70–99)

## 2013-01-18 MED ORDER — METOPROLOL TARTRATE 25 MG PO TABS
25.0000 mg | ORAL_TABLET | Freq: Two times a day (BID) | ORAL | Status: DC
  Administered 2013-01-18 – 2013-01-22 (×9): 25 mg via ORAL
  Filled 2013-01-18 (×10): qty 1

## 2013-01-18 NOTE — Progress Notes (Signed)
PT Cancellation Note  Patient Details Name: ANALESE SOVINE MRN: [id number] DOB: [birth year]   Cancelled Treatment:    Reason Treat Not Completed: Patient unavailable. Attempted earlier pt starting to bathe. Currently pt just back to bed and preparing to transfer to new unit.   Rotha Cassels 01/18/2013, 11:18 AM Pager [phone]

## 2013-01-18 NOTE — Progress Notes (Signed)
Per MD order, central line removed. IV cathter intact. Vaseline pressure gauze to site, pressure held x 5 min, no bleeding to site. Pt instructed not to get out of bed for 30 min after the removal of the central line. Instucted to keep dressing CDI x 24hours, if bleeding occurs hold pressure, and contact nurse. Pt verbalized understanding and did not have any questions. Catalina Pizza

## 2013-01-18 NOTE — Progress Notes (Signed)
Inpatient Diabetes Program Recommendations  AACE/ADA: New Consensus Statement on Inpatient Glycemic Control (2013)  Target Ranges:  Prepandial:   less than 140 mg/dL      Peak postprandial:   less than 180 mg/dL (1-2 hours)      Critically ill patients:  140 - 180 mg/dL   Reason for Visit: Results for Kaitlin [name], Kaitlin [name] (MRN [id number]) as of 01/18/2013 12:10  Ref. Range 01/17/2013 12:00 01/17/2013 16:37 01/17/2013 19:45 01/18/2013 07:34 01/18/2013 11:45  Glucose-Capillary Latest Range: 70-99 mg/dL 146 (H) 221 (H) 268 (H) 122 (H) 196 (H)   Fasting CBG's improved.  Consider increasing Novolog meal coverage to 14 units tid with meals.    Thanks, Adah Perl, RN, BC-ADM Inpatient Diabetes Coordinator Pager [phone]

## 2013-01-18 NOTE — Progress Notes (Signed)
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 Currently she feels slightly unsteady and prefers use of RW with gait. Will continue to assess balance and DME needs prior to d/c. Continues to need education on sternal precautions and safe mobility.   Follow Up Recommendations  Home health PT;Supervision - Intermittent     Does the patient have the potential to tolerate intense rehabilitation     Barriers to Discharge        Equipment Recommendations  Rolling walker with 5" wheels    Recommendations for Other Services    Frequency Min 3X/week   Progress towards PT Goals Progress towards PT goals: Progressing toward goals  Plan Current plan remains appropriate    Precautions / Restrictions Precautions Precautions: Sternal Precaution Comments: pt able to state sternal precautions; cues to adhere to precautions Restrictions Weight Bearing Restrictions: No Other Position/Activity Restrictions: sternal precautions   Pertinent Vitals/Pain On RA with SaO2 >90% when able to get a reading    Mobility  Transfers Overall transfer level: Needs assistance Equipment used: Rolling walker (2 wheeled) Transfers: Sit to/from Stand Sit to Stand: Min assist;+2 physical assistance General transfer comment: pt sitting in regular armchair on arrival (not barichair she has been using) which is much lower surface. Before pt even tried to stand, she became anxious and requested 2 nd person come to assist. Pt did well with vc for technique and little assist. 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 Pt needing consistent vc to adhere. Discussed home equipment needs and current home set-up (re: using lift chair instead of bed). Pt/husband also inquiring re: when she may drive and deferred to surgeon.   End of Session PT - End of Session Activity Tolerance: Patient tolerated treatment well Patient left: in chair;with call bell/phone within reach;with family/visitor present Nurse Communication: Mobility status   GP     Jaquarius Seder 01/18/2013, 6:21 PM Pager [phone]

## 2013-01-18 NOTE — Progress Notes (Signed)
6 Days Post-Op Procedure(s) (LRB): MEDIAN STERNOTOMY (N/A) RESECTION OF A THYMOMA (N/A) Subjective:  No complaints  Objective: Vital signs in last 24 hours: Temp:  [97.5 F (36.4 C)-98.7 F (37.1 C)] 98.3 F (36.8 C) (01/08 0736) Pulse Rate:  [99-117] 108 (01/08 0700) Cardiac Rhythm:  [-] Sinus tachycardia (01/08 0600) Resp:  [11-31] 19 (01/08 0700) BP: (90-135)/(42-92) 109/76 mmHg (01/08 0700) SpO2:  [92 %-100 %] 92 % (01/08 0700) Weight:  [159.1 kg (350 lb 12 oz)] 159.1 kg (350 lb 12 oz) (01/08 0311)  Hemodynamic parameters for last 24 hours:    Intake/Output from previous day: 01/07 0701 - 01/08 0700 In: 680 [P.O.:600; I.V.:80] Out: 4492 [Urine:1550] Intake/Output this shift:    General appearance: alert and cooperative Heart: regular rate and rhythm, S1, S2 normal, no murmur, click, rub or gallop Lungs: clear to auscultation bilaterally Extremities: extremities normal, atraumatic, no cyanosis or edema Wound: incision ok  Lab Results:  Recent Labs  01/17/13 0400  WBC 15.1*  HGB 10.2*  HCT 30.2*  PLT 350   BMET:  Recent Labs  01/17/13 0400  NA 140  K 4.9  CL 102  CO2 30  GLUCOSE 185*  BUN 33*  CREATININE 0.88  CALCIUM 9.2    PT/INR: No results found for this basename: LABPROT, INR,  in the last 72 hours ABG    Component Value Date/Time   PHART 7.347* 01/12/2013 1950   HCO3 22.8 01/12/2013 1950   TCO2 24 01/13/2013 1712   ACIDBASEDEF 3.0* 01/12/2013 1950   O2SAT 97.0 01/12/2013 1950   CBG (last 3)   Recent Labs  01/17/13 1200 01/17/13 1637 01/17/13 1945  GLUCAP 146* 221* 268*    Assessment/Plan: S/P Procedure(s) (LRB): MEDIAN STERNOTOMY (N/A) RESECTION OF A THYMOMA (N/A) She is doing well overall Awaiting transfer to 2W Continue ambulation and IS     LOS: 6 days    BARTLE,BRYAN K 01/18/2013

## 2013-01-19 LAB — GLUCOSE, CAPILLARY
GLUCOSE-CAPILLARY: 217 mg/dL — AB (ref 70–99)
GLUCOSE-CAPILLARY: 189 mg/dL — AB (ref 70–99)
GLUCOSE-CAPILLARY: 194 mg/dL — AB (ref 70–99)
GLUCOSE-CAPILLARY: 144 mg/dL — AB (ref 70–99)
Glucose-Capillary: 135 mg/dL — ABNORMAL HIGH (ref 70–99)

## 2013-01-19 MED ORDER — FUROSEMIDE 40 MG PO TABS
40.0000 mg | ORAL_TABLET | Freq: Every day | ORAL | Status: DC
  Administered 2013-01-19 – 2013-01-21 (×3): 40 mg via ORAL
  Filled 2013-01-19 (×4): qty 1

## 2013-01-19 MED ORDER — POTASSIUM CHLORIDE CRYS ER 20 MEQ PO TBCR
20.0000 meq | EXTENDED_RELEASE_TABLET | Freq: Every day | ORAL | Status: DC
  Administered 2013-01-19 – 2013-01-24 (×6): 20 meq via ORAL
  Filled 2013-01-19 (×5): qty 1

## 2013-01-19 MED ORDER — INSULIN DETEMIR 100 UNIT/ML ~~LOC~~ SOLN
45.0000 [IU] | Freq: Two times a day (BID) | SUBCUTANEOUS | Status: DC
  Administered 2013-01-19 – 2013-01-22 (×7): 45 [IU] via SUBCUTANEOUS
  Filled 2013-01-19 (×8): qty 0.45

## 2013-01-19 NOTE — Progress Notes (Addendum)
[address]       [place] [address]             [phone]      7 Days Post-Op  Procedure(s) (LRB): MEDIAN STERNOTOMY (N/A) RESECTION OF A THYMOMA (N/A) Subjective: Feels ok, moderate sternal pain  Objective  Telemetry sinus rhythm  Temp:  [97.8 F (36.6 C)-98.6 F (37 C)] 98.6 F (37 C) (01/09 0627) Pulse Rate:  [102-122] 110 (01/09 0627) Resp:  [9-28] 18 (01/09 0627) BP: (99-125)/(45-84) 112/45 mmHg (01/09 0627) SpO2:  [91 %-100 %] 93 % (01/09 0627) Weight:  [346 lb 2 oz (157 kg)] 346 lb 2 oz (157 kg) (01/09 0408)   Intake/Output Summary (Last 24 hours) at 01/19/13 0740 Last data filed at 01/18/13 1812  Gross per 24 hour  Intake    600 ml  Output      0 ml  Net    600 ml       General appearance: alert, cooperative and no distress Heart: regular rate and rhythm Lungs: dim in bases Abdomen: benign Extremities: + LE edema Wound: incis healing well  Lab Results:  Recent Labs  01/17/13 0400  NA 140  K 4.9  CL 102  CO2 30  GLUCOSE 185*  BUN 33*  CREATININE 0.88  CALCIUM 9.2   No results found for this basename: AST, ALT, ALKPHOS, BILITOT, PROT, ALBUMIN,  in the last 72 hours No results found for this basename: LIPASE, AMYLASE,  in the last 72 hours  Recent Labs  01/17/13 0400  WBC 15.1*  HGB 10.2*  HCT 30.2*  MCV 75.3*  PLT 350   No results found for this basename: CKTOTAL, CKMB, TROPONINI,  in the last 72 hours No components found with this basename: POCBNP,  No results found for this basename: DDIMER,  in the last 72 hours No results found for this basename: HGBA1C,  in the last 72 hours No results found for this basename: CHOL, HDL, LDLCALC, TRIG, CHOLHDL,  in the last 72 hours No results found for this basename: TSH, T4TOTAL, FREET3, T3FREE, THYROIDAB,  in the last 72 hours No results found for this basename: VITAMINB12, FOLATE, FERRITIN, TIBC, IRON, RETICCTPCT,  in the last 72  hours  Medications: Scheduled . aspirin EC  325 mg Oral Daily  . docusate sodium  200 mg Oral Daily  . enoxaparin (LOVENOX) injection  40 mg Subcutaneous Q12H  . guaiFENesin  600 mg Oral BID  . insulin aspart  0-20 Units Subcutaneous TID WC  . insulin aspart  10 Units Subcutaneous TID WC  . insulin detemir  40 Units Subcutaneous BID  . levalbuterol  1.25 mg Nebulization TID  . levofloxacin  750 mg Oral Daily  . metFORMIN  1,000 mg Oral BID WC  . metoprolol tartrate  25 mg Oral BID  . pantoprazole  40 mg Oral Daily  . sodium chloride  3 mL Intravenous Q12H     Radiology/Studies:  No results found.  INR: Will add last result for INR, ABG once components are confirmed Will add last 4 CBG results once components are confirmed  Assessment/Plan: S/P Procedure(s) (LRB): MEDIAN STERNOTOMY (N/A) RESECTION OF A THYMOMA (N/A)  1 steady overall progress 2 sugars fairly well controlled- will increase levemir a little 3 conts PT/Rehab, pulm toilet      LOS: 7 days    Kaitlin [name] 1/9/20157:40 AM   Chart reviewed, patient examined, agree with above. She still has some  pedal edema. She had some edema in legs preop. Will start daily lasix. Hopefully home in a few days when she is moving well and pain under control.

## 2013-01-19 NOTE — Progress Notes (Signed)
PT Cancellation Note  Patient Details Name: Kaitlin [name] MRN: [id number] DOB: [birth year]   Cancelled Treatment:    Reason Eval/Treat Not Completed: Patient declined, no reason specified (pt reporting 8/10 pain and states she "feels bad".)   Aquilla Shambley LUBECK 01/19/2013, 10:47 AM

## 2013-01-20 LAB — GLUCOSE, CAPILLARY
GLUCOSE-CAPILLARY: 170 mg/dL — AB (ref 70–99)
Glucose-Capillary: 200 mg/dL — ABNORMAL HIGH (ref 70–99)
Glucose-Capillary: 116 mg/dL — ABNORMAL HIGH (ref 70–99)
Glucose-Capillary: 137 mg/dL — ABNORMAL HIGH (ref 70–99)

## 2013-01-20 MED ORDER — INSULIN ASPART 100 UNIT/ML ~~LOC~~ SOLN
15.0000 [IU] | Freq: Three times a day (TID) | SUBCUTANEOUS | Status: DC
  Administered 2013-01-20 – 2013-01-24 (×13): 15 [IU] via SUBCUTANEOUS

## 2013-01-20 MED ORDER — KETOROLAC TROMETHAMINE 30 MG/ML IJ SOLN
30.0000 mg | Freq: Four times a day (QID) | INTRAMUSCULAR | Status: AC
  Administered 2013-01-20 – 2013-01-22 (×8): 30 mg via INTRAVENOUS
  Filled 2013-01-20 (×9): qty 1

## 2013-01-20 NOTE — Progress Notes (Addendum)
       [address]       [place] [address]             [phone]          8 Days Post-Op Procedure(s) (LRB): MEDIAN STERNOTOMY (N/A) RESECTION OF A THYMOMA (N/A)  Subjective: Still having a lot of pain, limiting her mobilty.  +cough this am, clear sputum.   Objective: Vital signs in last 24 hours: Patient Vitals for the past 24 hrs:  BP Temp Temp src Pulse Resp SpO2 Weight  01/20/13 0500 117/66 mmHg 98.3 F (36.8 C) Oral 114 20 97 % 342 lb 2.5 oz (155.2 kg)  01/19/13 2112 107/67 mmHg 100.4 F (38 C) Tympanic 116 20 100 % -  01/19/13 1428 102/64 mmHg 98.8 F (37.1 C) Oral 108 22 93 % -   Current Weight  01/20/13 342 lb 2.5 oz (155.2 kg)     Intake/Output from previous day: 01/09 0701 - 01/10 0700 In: 723 [P.O.:720; I.V.:3] Out: -   CBGs [phone]-    PHYSICAL EXAM:  Heart: RRR Lungs: Clear Wound: Clean and dry Extremities: +LE edema    Lab Results: CBC:No results found for this basename: WBC, HGB, HCT, PLT,  in the last 72 hours BMET: No results found for this basename: NA, K, CL, CO2, GLUCOSE, BUN, CREATININE, CALCIUM,  in the last 72 hours  PT/INR: No results found for this basename: LABPROT, INR,  in the last 72 hours    Assessment/Plan: S/P Procedure(s) (LRB): MEDIAN STERNOTOMY (N/A) RESECTION OF A THYMOMA (N/A) Pain control seems to be her main issue.  She is taking Oxy with some relief. Since her renal function is stable, I will add a few doses of Toradol to see if we can get her moving a bit more.   DM- sugars generally stable.  Continue Levemir, Glucophage, Novolog. Presumed bronchitis- Levaquin D#4.  Continue IS, pulm toilet. LE edema- continue diuresis. Deconditioning- mobility limited due to pain, obesity.  Continue PT.   LOS: 8 days    COLLINS,GINA H 01/20/2013  Patient seen and examined, agree with above

## 2013-01-21 LAB — GLUCOSE, CAPILLARY
GLUCOSE-CAPILLARY: 93 mg/dL (ref 70–99)
GLUCOSE-CAPILLARY: 112 mg/dL — AB (ref 70–99)
Glucose-Capillary: 119 mg/dL — ABNORMAL HIGH (ref 70–99)
Glucose-Capillary: 114 mg/dL — ABNORMAL HIGH (ref 70–99)

## 2013-01-21 LAB — BASIC METABOLIC PANEL
BUN: 22 mg/dL (ref 6–23)
CALCIUM: 9.5 mg/dL (ref 8.4–10.5)
CHLORIDE: 102 meq/L (ref 96–112)
CO2: 32 meq/L (ref 19–32)
CREATININE: 1.11 mg/dL — AB (ref 0.50–1.10)
GFR calc Af Amer: 68 mL/min — ABNORMAL LOW (ref >90)
GFR calc non Af Amer: 59 mL/min — ABNORMAL LOW (ref >90)
GLUCOSE: 108 mg/dL — AB (ref 70–99)
Potassium: 4.4 mEq/L (ref 3.7–5.3)
Sodium: 147 mEq/L (ref 137–147)

## 2013-01-21 NOTE — Progress Notes (Addendum)
       [address]       [place] [address]             [phone]          9 Days Post-Op Procedure(s) (LRB): MEDIAN STERNOTOMY (N/A) RESECTION OF A THYMOMA (N/A)  Subjective: Feels a little better today.  Still coughing, but not as much.  Less sore in chest.   Objective: Vital signs in last 24 hours: Patient Vitals for the past 24 hrs:  BP Temp Temp src Pulse Resp SpO2 Weight  01/21/13 0500 112/77 mmHg 97.8 F (36.6 C) Oral 104 20 94 % 341 lb 0.8 oz (154.7 kg)  01/20/13 2100 110/70 mmHg 98.1 F (36.7 C) Oral 109 20 93 % -  01/20/13 1407 - 98.1 F (36.7 C) Oral 107 19 94 % -   Current Weight  01/21/13 341 lb 0.8 oz (154.7 kg)     Intake/Output from previous day: 01/10 0701 - 01/11 0700 In: 120 [P.O.:120] Out: -   CBGs 137-108-119    PHYSICAL EXAM:  Heart: RRR Lungs: Clear Wound: Clean and dry Extremities: +LE edema    Lab Results: CBC:No results found for this basename: WBC, HGB, HCT, PLT,  in the last 72 hours BMET:  Recent Labs  01/21/13 0550  NA 147  K 4.4  CL 102  CO2 32  GLUCOSE 108*  BUN 22  CREATININE 1.11*  CALCIUM 9.5    PT/INR: No results found for this basename: LABPROT, INR,  in the last 72 hours    Assessment/Plan: S/P Procedure(s) (LRB): MEDIAN STERNOTOMY (N/A) RESECTION OF A THYMOMA (N/A) Pain control a little better.  Continue Toradol 1 more day, prn Oxy. DM- sugars stable. Continue Levemir, Glucophage, Novolog.  Presumed bronchitis- Levaquin D#5. Continue IS, pulm toilet.  LE edema- continue diuresis. Wt down 1 kg but UOP not documented. Deconditioning- mobility limited due to pain, obesity. Continue PT.    LOS: 9 days    [name],Kaitlin H 01/21/2013  Patient seen and examined, agree with above CBG much better controlled Still having a lot of pain but more relief with oxycodone than before

## 2013-01-22 LAB — GLUCOSE, CAPILLARY
GLUCOSE-CAPILLARY: 61 mg/dL — AB (ref 70–99)
GLUCOSE-CAPILLARY: 126 mg/dL — AB (ref 70–99)
GLUCOSE-CAPILLARY: 112 mg/dL — AB (ref 70–99)
Glucose-Capillary: 100 mg/dL — ABNORMAL HIGH (ref 70–99)
Glucose-Capillary: 122 mg/dL — ABNORMAL HIGH (ref 70–99)
Glucose-Capillary: 89 mg/dL (ref 70–99)

## 2013-01-22 MED ORDER — INSULIN ASPART 100 UNIT/ML ~~LOC~~ SOLN
0.0000 [IU] | Freq: Three times a day (TID) | SUBCUTANEOUS | Status: DC
  Administered 2013-01-22 – 2013-01-24 (×3): 2 [IU] via SUBCUTANEOUS

## 2013-01-22 MED ORDER — INSULIN DETEMIR 100 UNIT/ML ~~LOC~~ SOLN
40.0000 [IU] | Freq: Two times a day (BID) | SUBCUTANEOUS | Status: DC
  Administered 2013-01-22 – 2013-01-24 (×4): 40 [IU] via SUBCUTANEOUS
  Filled 2013-01-22 (×5): qty 0.4

## 2013-01-22 MED ORDER — FUROSEMIDE 10 MG/ML IJ SOLN
40.0000 mg | Freq: Once | INTRAMUSCULAR | Status: AC
  Administered 2013-01-22: 40 mg via INTRAVENOUS
  Filled 2013-01-22: qty 4

## 2013-01-22 NOTE — Progress Notes (Signed)
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 Pt reports her entire Lt leg and foot are more swollen today. Pt agrees to need for RW on discharge home. No further acute PT needs identified. HHPT for home safety eval due to use of new device (RW) on d/c.  Follow Up Recommendations  Home health PT;Supervision - Intermittent     Does the patient have the potential to tolerate intense rehabilitation     Barriers to Discharge        Equipment Recommendations  Rolling walker with 5" wheels    Recommendations for Other Services    Frequency Min 3X/week   Progress towards PT Goals Progress towards PT goals: Goals met/education completed, patient discharged from PT  Plan Current plan remains appropriate    Precautions / Restrictions Precautions Precautions: Sternal Precaution Comments: states and independently adheres to sternal precuations Restrictions Weight Bearing Restrictions: No   Pertinent Vitals/Pain Lt foot pain noted with ambulation; not rated; pt educated on elevation and ROM exercises to decr edema; RN notified    Mobility  Transfers Overall transfer level: Needs assistance Equipment used: Rolling walker (2 wheeled) Transfers: Sit to/from Stand Sit to Stand: Supervision General transfer comment: x2 (including std height toilet); vc for safe use of RW (tends to want to "park it" several feet from surface and finish walking without device. Ambulation/Gait Ambulation/Gait assistance: Supervision Ambulation Distance (Feet): 250  Feet Assistive device: Rolling walker (2 wheeled) Gait Pattern/deviations: Step-through pattern;Antalgic Gait velocity: slow General Gait Details: reports pain in Lt foot (new); ? 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).PMHx includes morbid obesity, DM, recent pna    Subjective Data      Cognition  Cognition Arousal/Alertness: Awake/alert Behavior During Therapy: WFL for tasks assessed/performed Overall Cognitive Status: Within Functional Limits for tasks assessed    Balance  General Comments General comments (skin integrity, edema, etc.): Educated on elevating Lt LE (difficult in barichair; used a second chair with pillows) and LE exs to assist with edema management  End of Session PT - End of Session Activity Tolerance: Patient tolerated treatment well Patient left: in chair;with call bell/phone within reach Nurse Communication: Other (comment) (pain in Lt foot)   GP     Aniyla Harling 01/22/2013, 12:03 PM Pager [phone]

## 2013-01-22 NOTE — Progress Notes (Signed)
NUTRITION FOLLOW UP  Intervention:    No nutrition intervention warranted at this time   RD to continue to follow  Nutrition Dx:   Inadequate oral intake, resolved  New Goal:   Pt to meet >/= 90% of their estimated nutrition needs, met  Monitor:   PO intake, weight, labs, I/O's  Assessment:   Patient with PMH of DM, HTN and PNA; presented for surgery after CT scan showed large anterior mediastinal mass.   Patient s/p procedures 01/12/13:  MEDIAN STERNOTOMY  RESECTION OF THYMOMA  Patient transferred to 2W-Cardiac from 2S-Surgical 1/8.  Patient's appetite good.  PO intake mostly 75-100% per flowsheet records.  Hopefully home soon.  Height: Ht Readings from Last 1 Encounters:  01/12/13 '5\' 9"'  (1.753 m)    Weight Status:   Wt Readings from Last 1 Encounters:  01/22/13 342 lb 14.4 oz (155.538 kg)    Re-estimated needs:  Kcal: 1800-2000 Protein: 90-100 gm Fluid: 1.8-2.0 L  Skin: chest surgical incision   Diet Order: Carb Control   Intake/Output Summary (Last 24 hours) at 01/22/13 1153 Last data filed at 01/21/13 2243  Gross per 24 hour  Intake    363 ml  Output      0 ml  Net    363 ml    Labs:   Recent Labs Lab 01/17/13 0400 01/21/13 0550  NA 140 147  K 4.9 4.4  CL 102 102  CO2 30 32  BUN 33* 22  CREATININE 0.88 1.11*  CALCIUM 9.2 9.5  GLUCOSE 185* 108*    CBG (last 3)   Recent Labs  01/22/13 0738 01/22/13 1049 01/22/13 1132  GLUCAP 126* 112* 100*    Scheduled Meds: . aspirin EC  325 mg Oral Daily  . docusate sodium  200 mg Oral Daily  . enoxaparin (LOVENOX) injection  40 mg Subcutaneous Q12H  . guaiFENesin  600 mg Oral BID  . insulin aspart  0-20 Units Subcutaneous TID WC  . insulin aspart  15 Units Subcutaneous TID WC  . insulin detemir  45 Units Subcutaneous BID  . levofloxacin  750 mg Oral Daily  . metFORMIN  1,000 mg Oral BID WC  . metoprolol tartrate  25 mg Oral BID  . pantoprazole  40 mg Oral Daily  . potassium chloride  20  mEq Oral Daily  . sodium chloride  3 mL Intravenous Q12H    Continuous Infusions:    Arthur Holms, RD, LDN Pager #: [phone] After-Hours Pager #: [phone]

## 2013-01-22 NOTE — Plan of Care (Signed)
Problem: Acute Rehab PT Goals(only PT should resolve) Goal: Pt Will Go Supine/Side To Sit Outcome: Not Applicable Date Met:  71/25/24 Pt to sleep in lift chair on d/c Goal: Pt Will Go Sit To Supine/Side Outcome: Not Applicable Date Met:  79/98/00 Pt to sleep in lift chair on d/c

## 2013-01-22 NOTE — Progress Notes (Signed)
Inpatient Diabetes Program Recommendations  AACE/ADA: New Consensus Statement on Inpatient Glycemic Control (2013)  Target Ranges:  Prepandial:   less than 140 mg/dL      Peak postprandial:   less than 180 mg/dL (1-2 hours)      Critically ill patients:  140 - 180 mg/dL   Reason for Visit: Results for Kaitlin [name], Kaitlin [name] (MRN [id number]) as of 01/22/2013 13:50  Ref. Range 01/22/2013 06:51 01/22/2013 07:38 01/22/2013 10:49 01/22/2013 11:32  Glucose-Capillary Latest Range: 70-99 mg/dL 61 (L) 126 (H) 112 (H) 100 (H)   Note fasting glucose less than 70 mg/dL.  Please decrease Levemir to 40 units bid.  Also consider decreasing Novolog correction to moderate tid wc.    Thanks, Adah Perl, RN, BC-ADM Inpatient Diabetes Coordinator Pager [phone]

## 2013-01-22 NOTE — Progress Notes (Addendum)
       [address]       [place] [address]             [phone]          10 Days Post-Op Procedure(s) (LRB): MEDIAN STERNOTOMY (N/A) RESECTION OF A THYMOMA (N/A)  Subjective: Feels a little better today, pain control improved.  Still with deep productive cough. Walked some in the halls yesterday.   Objective: Vital signs in last 24 hours: Patient Vitals for the past 24 hrs:  BP Temp Temp src Pulse Resp SpO2 Weight  01/22/13 0538 102/54 mmHg 98 F (36.7 C) Oral 92 20 97 % 342 lb 14.4 oz (155.538 kg)  01/21/13 2033 104/53 mmHg 98.1 F (36.7 C) Oral 100 20 94 % -  01/21/13 1441 89/57 mmHg 98.2 F (36.8 C) Oral 109 - 97 % -  01/21/13 1146 99/67 mmHg - - 114 - - -   Current Weight  01/22/13 342 lb 14.4 oz (155.538 kg)     Intake/Output from previous day: 01/11 0701 - 01/12 0700 In: 363 [P.O.:360; I.V.:3] Out: -   CBGs 93-112-61-126    PHYSICAL EXAM:  Heart: RRR Lungs: Few coarse BS that clear with cough Wound: Clean and dry Extremities: +LE edema    Lab Results: CBC:No results found for this basename: WBC, HGB, HCT, PLT,  in the last 72 hours BMET:  Recent Labs  01/21/13 0550  NA 147  K 4.4  CL 102  CO2 32  GLUCOSE 108*  BUN 22  CREATININE 1.11*  CALCIUM 9.5    PT/INR: No results found for this basename: LABPROT, INR,  in the last 72 hours    Assessment/Plan: S/P Procedure(s) (LRB): MEDIAN STERNOTOMY (N/A) RESECTION OF A THYMOMA (N/A) She is overall doing well, but still has a lot of LE edema despite po Lasix.  She states that she has had problems with fluid retention in the past and takes low dose Lasix at home. Will give Lasix IV today. DM- sugars stable. Bronchitis- Levaquin D#6. Cough persists, but sputum now mostly clear. Continue PT/ambulation. Hopefully home soon.   LOS: 10 days    COLLINS,GINA H 01/22/2013   Chart reviewed, patient examined, agree with above. She is making slow progress but is stable. It  is going to take her several more weeks to feel significantly better due to morbid obesity. Her weight is at preop baseline. She has had edema in legs for a long time and probably has some degree of chronic heart failure. She should remain on oral diuretic. Her cough is better but still there. I would treat her with Levaquin for 10 days. Her glucose is under good control for her and she had an episode of hypoglycemia to 61 this am. Will change Lantus and SSI as rec by diabetes coordinator. I think she can go home tomorrow if nothing changes.

## 2013-01-23 LAB — GLUCOSE, CAPILLARY
GLUCOSE-CAPILLARY: 112 mg/dL — AB (ref 70–99)
Glucose-Capillary: 125 mg/dL — ABNORMAL HIGH (ref 70–99)
Glucose-Capillary: 120 mg/dL — ABNORMAL HIGH (ref 70–99)
Glucose-Capillary: 66 mg/dL — ABNORMAL LOW (ref 70–99)
Glucose-Capillary: 105 mg/dL — ABNORMAL HIGH (ref 70–99)

## 2013-01-23 MED ORDER — METFORMIN HCL 1000 MG PO TABS: 1000.0000 mg | ORAL_TABLET | Freq: Two times a day (BID) | ORAL | Status: DC

## 2013-01-23 MED ORDER — OXYCODONE HCL 5 MG PO TABS: 5.0000 mg | ORAL_TABLET | ORAL | Status: DC | PRN

## 2013-01-23 MED ORDER — LEVOFLOXACIN 750 MG PO TABS: 750.0000 mg | ORAL_TABLET | Freq: Every day | ORAL | Status: DC

## 2013-01-23 MED ORDER — GUAIFENESIN ER 600 MG PO TB12: 600.0000 mg | ORAL_TABLET | Freq: Two times a day (BID) | ORAL | Status: DC

## 2013-01-23 MED ORDER — FUROSEMIDE 20 MG PO TABS
20.0000 mg | ORAL_TABLET | Freq: Every day | ORAL | Status: DC
  Administered 2013-01-23 – 2013-01-24 (×2): 20 mg via ORAL
  Filled 2013-01-23 (×2): qty 1

## 2013-01-23 MED ORDER — INSULIN REGULAR HUMAN 100 UNIT/ML IJ SOLN: 20.0000 [IU] | Freq: Three times a day (TID) | INTRAMUSCULAR | Status: DC

## 2013-01-23 MED ORDER — ASPIRIN 325 MG PO TBEC: 325.0000 mg | DELAYED_RELEASE_TABLET | Freq: Every day | ORAL | Status: DC

## 2013-01-23 MED ORDER — INSULIN GLARGINE 100 UNIT/ML ~~LOC~~ SOLN: 20.0000 [IU] | Freq: Two times a day (BID) | SUBCUTANEOUS | Status: DC

## 2013-01-23 MED ORDER — INSULIN REGULAR HUMAN 100 UNIT/ML IJ SOLN: 15.0000 [IU] | Freq: Three times a day (TID) | INTRAMUSCULAR | Status: DC

## 2013-01-23 MED ORDER — LACTULOSE 10 GM/15ML PO SOLN
20.0000 g | Freq: Once | ORAL | Status: AC
  Administered 2013-01-23: 20 g via ORAL
  Filled 2013-01-23: qty 30

## 2013-01-23 MED ORDER — METOPROLOL TARTRATE 12.5 MG HALF TABLET
12.5000 mg | ORAL_TABLET | Freq: Two times a day (BID) | ORAL | Status: DC
  Administered 2013-01-23 – 2013-01-24 (×2): 12.5 mg via ORAL
  Filled 2013-01-23 (×3): qty 1

## 2013-01-23 NOTE — Progress Notes (Addendum)
       [address]       [place] [address]             [phone]          11 Days Post-Op Procedure(s) (LRB): MEDIAN STERNOTOMY (N/A) RESECTION OF A THYMOMA (N/A)  Subjective: Patient with complaints of lower legs swelling. She is eating lunch. She has not had a bowel movement in a "long time", but she is passing flatus. She would like to go home.  Objective: Vital signs in last 24 hours: Patient Vitals for the past 24 hrs:  BP Temp Temp src Pulse Resp SpO2 Weight  01/23/13 0608 116/67 mmHg 98.2 F (36.8 C) Oral 109 18 97 % 155.8 kg (343 lb 7.6 oz)  01/22/13 2024 101/56 mmHg 98.3 F (36.8 C) Oral 114 18 96 % -  01/22/13 1417 85/61 mmHg - - - - - -  01/22/13 1415 79/51 mmHg 98.1 F (36.7 C) Oral 116 19 96 % -   Current Weight  01/23/13 155.8 kg (343 lb 7.6 oz)     Intake/Output from previous day: 01/12 0701 - 01/13 0700 In: 240 [P.O.:240] Out: -   CBGs 122/89/125/112   PHYSICAL EXAM:  Cardiovascular: Tachycardic Pulmonary:Slightly diminished at bases Extremities: 2+ LE edema Wound: Clean and dry   Lab Results: CBC:No results found for this basename: WBC, HGB, HCT, PLT,  in the last 72 hours BMET:   Recent Labs  01/21/13 0550  NA 147  K 4.4  CL 102  CO2 32  GLUCOSE 108*  BUN 22  CREATININE 1.11*  CALCIUM 9.5    PT/INR: No results found for this basename: LABPROT, INR,  in the last 72 hours    Assessment/Plan: S/P Procedure(s) (LRB): MEDIAN STERNOTOMY (N/A) RESECTION OF A THYMOMA (N/A) 1. CV-ST in the 120's. Was on Lopressor but this was stopped secondary to labile blood pressure. Monitor 2.Pulmonary-Bronchitis. On Levaquin D#7/10. 3.DM-CBGs well controlled. Will need follow up with medical doctor 4.LOC constipation 5.Increase ambulation 6.As discussed with Dr. Cyndia Bent, likely discharge in am   LOS: 11 days    Arnoldo Lenis 01/23/2013     Chart reviewed, patient examined, agree with above. She had 2  Bm's today. She has some resting tachycardia off lopressor. Will resume low dose. She still has some edema in legs. Will keep on daily lasix. She will need outpt follow up with her primary physician.

## 2013-01-23 NOTE — Progress Notes (Signed)
Patient had 2 large bowel movements this evening after receiving lactulose. Patient had not had BM since 01/12/13 Roxan Hockey

## 2013-01-23 NOTE — Progress Notes (Signed)
Patient has ambulated twice this evening around unit walking approx 350 feet each time, using front wheel walker, on RA. Patient tolerated walk well, denies any SOB just stated that she was tired. Elmarie Shiley R

## 2013-01-24 LAB — GLUCOSE, CAPILLARY
GLUCOSE-CAPILLARY: 125 mg/dL — AB (ref 70–99)
GLUCOSE-CAPILLARY: 107 mg/dL — AB (ref 70–99)
Glucose-Capillary: 173 mg/dL — ABNORMAL HIGH (ref 70–99)
Glucose-Capillary: 153 mg/dL — ABNORMAL HIGH (ref 70–99)

## 2013-01-24 MED ORDER — METOPROLOL TARTRATE 25 MG PO TABS: 12.5000 mg | ORAL_TABLET | Freq: Two times a day (BID) | ORAL | Status: DC

## 2013-01-24 MED ORDER — METFORMIN HCL 500 MG PO TABS: 500.0000 mg | ORAL_TABLET | Freq: Two times a day (BID) | ORAL | Status: DC

## 2013-01-24 MED ORDER — POTASSIUM CHLORIDE CRYS ER 10 MEQ PO TBCR: 10.0000 meq | EXTENDED_RELEASE_TABLET | Freq: Every day | ORAL | Status: DC

## 2013-01-24 MED ORDER — INSULIN GLARGINE 100 UNIT/ML ~~LOC~~ SOLN: 30.0000 [IU] | Freq: Every day | SUBCUTANEOUS | Status: DC

## 2013-01-24 MED ORDER — INSULIN REGULAR HUMAN 100 UNIT/ML IJ SOLN: 30.0000 [IU] | Freq: Three times a day (TID) | INTRAMUSCULAR | Status: DC

## 2013-01-24 MED ORDER — FUROSEMIDE 20 MG PO TABS: 20.0000 mg | ORAL_TABLET | Freq: Every day | ORAL | Status: DC

## 2013-01-24 NOTE — Progress Notes (Signed)
Patient and RN reviewed discharge information.  RN reiterated bathing with patient with incision sites.  RN states understanding.  Patient discharged via wheelchair.

## 2013-01-24 NOTE — Progress Notes (Signed)
Hypoglycemic Event  CBG:66  Treatment:1 can soda,graham crakers  Symptoms:asymtomatic  Follow-up CBG: Time:2250 CBG Result:105  Possible Reasons for Event:unknown  Comments/MD notified:MD not notified    Kaitlin [name], Kaitlin [name]  Remember to initiate Hypoglycemia Order Set & complete

## 2013-01-24 NOTE — Progress Notes (Signed)
Chest tubes sutures removed per protocol, edges intact benzoin and steristrips applied Joylene Draft A

## 2013-01-24 NOTE — Progress Notes (Addendum)
      [address]       [place] [address]             [phone]      12 Days Post-Op Procedure(s) (LRB): MEDIAN STERNOTOMY (N/A) RESECTION OF A THYMOMA (N/A)  Subjective:  Ms. Kaitlin [name] has no complaints this morning.  She is ready to be discharged.  She did have an episode of hypoglycemia last night. + BM  Objective: Vital signs in last 24 hours: Temp:  [98.1 F (36.7 C)-98.5 F (36.9 C)] 98.1 F (36.7 C) (01/14 0405) Pulse Rate:  [105-121] 105 (01/14 0405) Cardiac Rhythm:  [-] Sinus tachycardia (01/13 1940) Resp:  [18-20] 18 (01/14 0405) BP: (93-113)/(61-69) 93/61 mmHg (01/14 0405) SpO2:  [93 %-95 %] 95 % (01/14 0405) Weight:  [340 lb 6.2 oz (154.4 kg)] 340 lb 6.2 oz (154.4 kg) (01/14 0503)  Intake/Output from previous day: 01/13 0701 - 01/14 0700 In: 720 [P.O.:720] Out: 1 [Urine:1] Intake/Output this shift: Total I/O In: 240 [P.O.:240] Out: -   General appearance: alert, cooperative and no distress Heart: regular rate and rhythm and tachy Lungs: clear to auscultation bilaterally Abdomen: soft, non-tender; bowel sounds normal; no masses,  no organomegaly Extremities: edema 1-2+ pitting Wound: clean and dry  Lab Results: No results found for this basename: WBC, HGB, HCT, PLT,  in the last 72 hours BMET: No results found for this basename: NA, K, CL, CO2, GLUCOSE, BUN, CREATININE, CALCIUM,  in the last 72 hours  PT/INR: No results found for this basename: LABPROT, INR,  in the last 72 hours ABG    Component Value Date/Time   PHART 7.347* 01/12/2013 1950   HCO3 22.8 01/12/2013 1950   TCO2 24 01/13/2013 1712   ACIDBASEDEF 3.0* 01/12/2013 1950   O2SAT 97.0 01/12/2013 1950   CBG (last 3)   Recent Labs  01/23/13 2122 01/23/13 2152 01/24/13 0601  GLUCAP 66* 105* 125*    Assessment/Plan: S/P Procedure(s) (LRB): MEDIAN STERNOTOMY (N/A) RESECTION OF A THYMOMA (N/A)  1. CV- Sinus Tachy- continue Lopressor 2. Pulm- no acute issues, Bronchitis on  ABX Day 8/10 3. DM- CBGs controlled in hospital, some episodic hypoglycemia patient is asymptomatic 4. LOC Constipation- resolved with lactulose 5. Dispo- patient is stable, will d/c home today- patient will be sent on diuretic due to LE edema   LOS: 12 days    Ellwood Handler 01/24/2013   Chart reviewed, patient examined, agree with above. She is stable for discharge. Will send her home on her previous diabetes regimen.  When I see her back I will schedule an appt to see Dr. Tammi Klippel with Rad Onc.

## 2013-01-26 ENCOUNTER — Other Ambulatory Visit: Payer: Self-pay | Admitting: *Deleted

## 2013-01-26 DIAGNOSIS — D150 Benign neoplasm of thymus: Principal | ICD-10-CM

## 2013-01-26 DIAGNOSIS — D4989 Neoplasm of unspecified behavior of other specified sites: Secondary | ICD-10-CM

## 2013-01-31 ENCOUNTER — Other Ambulatory Visit: Payer: Self-pay | Admitting: Surgery

## 2013-01-31 ENCOUNTER — Ambulatory Visit
Admission: RE | Admit: 2013-01-31 | Discharge: 2013-01-31 | Disposition: A | Discharge status: Home / Self Care | Payer: Medicaid Other | Source: Ambulatory Visit | Attending: Surgery | Admitting: Surgery

## 2013-01-31 ENCOUNTER — Encounter: Payer: Self-pay | Admitting: Surgery

## 2013-01-31 ENCOUNTER — Ambulatory Visit (INDEPENDENT_AMBULATORY_CARE_PROVIDER_SITE_OTHER): Payer: BC Managed Care – PPO | Admitting: Surgery

## 2013-01-31 ENCOUNTER — Other Ambulatory Visit: Payer: Self-pay | Admitting: *Deleted

## 2013-01-31 VITALS — BP 147/90 | HR 114 | Temp 99.2°F | Resp 22 | Ht 69.0 in

## 2013-01-31 DIAGNOSIS — R059 Cough, unspecified: Secondary | ICD-10-CM

## 2013-01-31 DIAGNOSIS — Z09 Encounter for follow-up examination after completed treatment for conditions other than malignant neoplasm: Secondary | ICD-10-CM

## 2013-01-31 DIAGNOSIS — D4989 Neoplasm of unspecified behavior of other specified sites: Secondary | ICD-10-CM

## 2013-01-31 DIAGNOSIS — D150 Benign neoplasm of thymus: Secondary | ICD-10-CM

## 2013-01-31 DIAGNOSIS — R05 Cough: Secondary | ICD-10-CM

## 2013-01-31 LAB — URINALYSIS, ROUTINE W REFLEX MICROSCOPIC
Bilirubin Urine: NEGATIVE
Glucose, UA: NEGATIVE mg/dL
Hgb urine dipstick: NEGATIVE
Ketones, ur: NEGATIVE mg/dL
Leukocytes, UA: NEGATIVE
Nitrite: NEGATIVE
PROTEIN: NEGATIVE mg/dL
Specific Gravity, Urine: 1.029 (ref 1.005–1.030)
UROBILINOGEN UA: 0.2 mg/dL (ref 0.0–1.0)
pH: 6 (ref 5.0–8.0)

## 2013-01-31 LAB — CBC
HEMATOCRIT: 33.3 % — AB (ref 36.0–46.0)
HEMOGLOBIN: 11 g/dL — AB (ref 12.0–15.0)
MCH: 25.5 pg — ABNORMAL LOW (ref 26.0–34.0)
MCHC: 33 g/dL (ref 30.0–36.0)
MCV: 77.3 fL — ABNORMAL LOW (ref 78.0–100.0)
Platelets: 408 10*3/uL — ABNORMAL HIGH (ref 150–400)
RBC: 4.31 MIL/uL (ref 3.87–5.11)
RDW: 17.9 % — AB (ref 11.5–15.5)
WBC: 10.1 10*3/uL (ref 4.0–10.5)

## 2013-01-31 NOTE — Progress Notes (Signed)
HPI:  The patient returns today for evaluation after calling this am with report of a new fever to 102. 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 Her postop course has been slow due to morbid obesity. She was in the hospital for 12 days. She did have a bad cough postop and was treated with Levaquin for 10 days for suspected bronchitis. This resolved the cough and she went home feeling sore but otherwise ok. She says she was doing fairly well until the past couple days when she developed a cough, nasal stuffiness, runny nose and a fever overnight to 102.  Current Outpatient Prescriptions  Medication Sig Dispense Refill  . acetaminophen (TYLENOL) 500 MG tablet Take 1,000 mg by mouth daily as needed for moderate pain.       Marland Kitchen albuterol (PROVENTIL HFA;VENTOLIN HFA) 108 (90 BASE) MCG/ACT inhaler Inhale 2 puffs into the lungs every 4 (four) hours as needed for wheezing.  1 Inhaler  0  . aspirin EC 325 MG EC tablet Take 1 tablet (325 mg total) by mouth daily.  30 tablet  0  . ferrous fumarate (HEMOCYTE - 106 MG FE) 325 (106 FE) MG TABS tablet Take 1 tablet by mouth every morning.       . furosemide (LASIX) 20 MG tablet Take 1 tablet (20 mg total) by mouth daily.  30 tablet  3  . guaiFENesin (MUCINEX) 600 MG 12 hr tablet Take 1 tablet (600 mg total) by mouth 2 (two) times daily. For cough      . ibuprofen (ADVIL,MOTRIN) 200 MG tablet Take 800 mg by mouth daily as needed for mild pain.      Marland Kitchen insulin glargine (LANTUS) 100 UNIT/ML injection Inject 0.3 mLs (30 Units total) into the skin at bedtime.  10 mL  11  . insulin regular (NOVOLIN R,HUMULIN R) 100 units/mL injection Inject 0.3 mLs (30 Units total) into the skin 3 (three) times daily before meals.  10 mL  11  . metFORMIN (GLUCOPHAGE) 500 MG tablet Take 1 tablet (500 mg total) by mouth 2 (two) times daily with a meal.  60 tablet  1  . metoprolol tartrate (LOPRESSOR) 25 MG tablet Take 0.5 tablets (12.5 mg total)  by mouth 2 (two) times daily.  30 tablet  3  . Multiple Vitamin (MULTIVITAMIN WITH MINERALS) TABS Take 1 tablet by mouth every morning.       Marland Kitchen oxyCODONE (OXY IR/ROXICODONE) 5 MG immediate release tablet Take 1-2 tablets (5-10 mg total) by mouth every 4 (four) hours as needed for severe pain.  40 tablet  0  . potassium chloride SA (K-DUR,KLOR-CON) 10 MEQ tablet Take 1 tablet (10 mEq total) by mouth daily.  30 tablet  3  . simvastatin (ZOCOR) 40 MG tablet Take 40 mg by mouth at bedtime.      Marland Kitchen Specialty Vitamins Products (VITAMINS FOR HAIR PO) Take 1 tablet by mouth every morning.       . vitamin C (ASCORBIC ACID) 500 MG tablet Take 1,000 mg by mouth once as needed (for cold).        No current facility-administered medications for this visit.     Physical Exam: BP 147/90  Pulse 114  Temp(Src) 99.2 F (37.3 C) (Oral)  Resp 22  Ht 5\' 9"  (1.753 m)  SpO2 97%  LMP 06/17/2012 She looks tired but in no distress. Nose is running and she has a non-productive cough. Lungs: decreased breath sounds  right base. No rales or rhonchi, no wheezing. Heart: RRR, normal heart sounds, no murmur. Chest incision healing well. Moderate bilateral lower leg edema which was present in the hospital.  Diagnostic Tests:  CLINICAL DATA: Fever and cough  EXAM:  CHEST 2 VIEW  COMPARISON: 01/17/2013  FINDINGS:  Median sternotomy. Cardiac enlargement. Congestive heart failure has  resolved. Vascularity now normal. Small right effusion remains. Mild  right lower lobe atelectasis. Negative for pulmonary edema. Mild  right upper lobe atelectasis.  IMPRESSION:  Improving vascular congestion and edema. Small right effusion and  right lower lobe atelectasis remain.  Electronically Signed  By: Franchot Gallo M.D.  On: 01/31/2013 10:31    Impression:  She could be developing some pneumonia given her history of prior pneumonia and morbid obesity. There is nothing clearcut on CXR but there is left base  atelectasis and a small effusion. She does have URI symptoms so this could be a viral illness, even flu. She did have a flu shot. There is no evidence of surgical site infection. I think we always have to be concerned about UTI in this patient.  Plan:  I will send her for a UA and CBC now. I will start her on Levaquin 750 mg daily for 10 days in case she is developing some pneumonia. It seemed to resolve her symptoms before. I encouraged her to use her IS. She is not in distress with good oxygen sats. I told her that we will notify her if her UA or CBC is abnormal. I will plan to see her back in 1 week. She knows that if she worsens she should come to the ER.

## 2013-02-01 LAB — URINE CULTURE

## 2013-02-05 ENCOUNTER — Telehealth: Payer: Self-pay | Admitting: Dietician

## 2013-02-05 NOTE — Telephone Encounter (Signed)
Brief Outpatient Oncology Nutrition Note  Patient has been identified to be at risk on malnutrition screen.  Wt Readings from Last 10 Encounters:  01/24/13 340 lb 6.2 oz (154.4 kg)  01/24/13 340 lb 6.2 oz (154.4 kg)  01/09/13 341 lb 4.8 oz (154.813 kg)  12/21/12 335 lb 9.6 oz (152.227 kg)  12/20/12 350 lb (158.759 kg)  12/12/12 350 lb 5 oz (158.9 kg)  11/30/12 340 lb 2.7 oz (154.3 kg)  11/30/12 340 lb 2.7 oz (154.3 kg)  11/29/12 340 lb (154.223 kg)  11/29/12 153 lb 9.6 oz (69.673 kg)    Dx: thymoma resection by median sternotomy 01/12/13.  Noted fever last week.  Weight overall appears to be stable but called patient due to concerns of adequate protein intake with obesity.  Called patient who reported that she is feeling better and is eating fair.  Encouraged healthy eating making sure of adequate protein.  Instructed to call the [facility] for any nutrition needs or questions.    Antonieta Iba, RD, LDN

## 2013-02-06 ENCOUNTER — Other Ambulatory Visit: Payer: Self-pay | Admitting: *Deleted

## 2013-02-06 DIAGNOSIS — D150 Benign neoplasm of thymus: Secondary | ICD-10-CM

## 2013-02-07 ENCOUNTER — Ambulatory Visit: Payer: BC Managed Care – PPO | Admitting: Radiation Oncology

## 2013-02-07 ENCOUNTER — Ambulatory Visit: Payer: BC Managed Care – PPO

## 2013-02-07 ENCOUNTER — Ambulatory Visit: Payer: BC Managed Care – PPO | Admitting: Surgery

## 2013-02-08 ENCOUNTER — Encounter: Payer: Self-pay | Admitting: Surgery

## 2013-02-08 ENCOUNTER — Ambulatory Visit
Admission: RE | Admit: 2013-02-08 | Discharge: 2013-02-08 | Disposition: A | Discharge status: Home / Self Care | Payer: Medicaid Other | Source: Ambulatory Visit | Attending: Surgery | Admitting: Surgery

## 2013-02-08 ENCOUNTER — Ambulatory Visit (INDEPENDENT_AMBULATORY_CARE_PROVIDER_SITE_OTHER): Payer: BC Managed Care – PPO | Admitting: Surgery

## 2013-02-08 VITALS — BP 127/76 | HR 95 | Resp 20 | Ht 69.0 in | Wt 340.0 lb

## 2013-02-08 DIAGNOSIS — D150 Benign neoplasm of thymus: Secondary | ICD-10-CM

## 2013-02-08 DIAGNOSIS — Z09 Encounter for follow-up examination after completed treatment for conditions other than malignant neoplasm: Secondary | ICD-10-CM

## 2013-02-08 DIAGNOSIS — D4989 Neoplasm of unspecified behavior of other specified sites: Secondary | ICD-10-CM

## 2013-02-08 NOTE — Progress Notes (Signed)
HPI:  The patient returns today for followup after median sternotomy for thymoma resection. I saw her last week due to fever to 102 with cough and URI symptoms. Her CXR showed right base atelectasis and a small effusion. A urinalysis was negative. Her WBC count was normal and her incision looked good. I thought it was probably viral but I put her on Levaquin for 10 days for possible bronchitis or pneumonia. Over the past week she has continued to improve. She denies any further fever and feels much better.  Current Outpatient Prescriptions  Medication Sig Dispense Refill  . acetaminophen (TYLENOL) 500 MG tablet Take 1,000 mg by mouth daily as needed for moderate pain.       Marland Kitchen albuterol (PROVENTIL HFA;VENTOLIN HFA) 108 (90 BASE) MCG/ACT inhaler Inhale 2 puffs into the lungs every 4 (four) hours as needed for wheezing.  1 Inhaler  0  . aspirin EC 325 MG EC tablet Take 1 tablet (325 mg total) by mouth daily.  30 tablet  0  . ferrous fumarate (HEMOCYTE - 106 MG FE) 325 (106 FE) MG TABS tablet Take 1 tablet by mouth every morning.       . furosemide (LASIX) 20 MG tablet Take 1 tablet (20 mg total) by mouth daily.  30 tablet  3  . guaiFENesin (MUCINEX) 600 MG 12 hr tablet Take 1 tablet (600 mg total) by mouth 2 (two) times daily. For cough      . ibuprofen (ADVIL,MOTRIN) 200 MG tablet Take 800 mg by mouth daily as needed for mild pain.      Marland Kitchen insulin glargine (LANTUS) 100 UNIT/ML injection Inject 0.3 mLs (30 Units total) into the skin at bedtime.  10 mL  11  . insulin regular (NOVOLIN R,HUMULIN R) 100 units/mL injection Inject 0.3 mLs (30 Units total) into the skin 3 (three) times daily before meals.  10 mL  11  . levofloxacin (LEVAQUIN) 750 MG tablet Take 750 mg by mouth daily. x10 days      . metFORMIN (GLUCOPHAGE) 500 MG tablet Take 1 tablet (500 mg total) by mouth 2 (two) times daily with a meal.  60 tablet  1  . metoprolol tartrate (LOPRESSOR) 25 MG tablet Take 0.5 tablets (12.5 mg total) by  mouth 2 (two) times daily.  30 tablet  3  . Multiple Vitamin (MULTIVITAMIN WITH MINERALS) TABS Take 1 tablet by mouth every morning.       Marland Kitchen oxyCODONE (OXY IR/ROXICODONE) 5 MG immediate release tablet Take 1-2 tablets (5-10 mg total) by mouth every 4 (four) hours as needed for severe pain.  40 tablet  0  . potassium chloride SA (K-DUR,KLOR-CON) 10 MEQ tablet Take 1 tablet (10 mEq total) by mouth daily.  30 tablet  3  . simvastatin (ZOCOR) 40 MG tablet Take 40 mg by mouth at bedtime.      Marland Kitchen Specialty Vitamins Products (VITAMINS FOR HAIR PO) Take 1 tablet by mouth every morning.       . vitamin C (ASCORBIC ACID) 500 MG tablet Take 1,000 mg by mouth once as needed (for cold).        No current facility-administered medications for this visit.     Physical Exam: BP 127/76  Pulse 95  Resp 20  Ht 5\' 9"  (1.753 m)  Wt 340 lb (154.223 kg)  BMI 50.19 kg/m2  SpO2 98%  LMP 06/17/2012 She looks well Lungs are clear There chest incision is well-healed and the sternum is stable.  There is mild ankle edema  Diagnostic Tests:  CLINICAL DATA: Prior surgery for thymus mass.  EXAM:  CHEST 2 VIEW  COMPARISON: DG CHEST 2 VIEW dated 01/31/2013; DG CHEST 1V PORT dated  01/17/2013  FINDINGS:  Mediastinum and hilar structures are normal. Prior median  sternotomy. Heart size normal. Mild basilar atelectasis. Previously  identified changes of congestive heart failure have largely resolved  with mild residual pleural effusions and minimal interstitial  prominence.  IMPRESSION:  Interim near complete resolution of congestive heart failure.  Electronically Signed  By: Marcello Moores Register  On: 02/08/2013 10:31   Impression:  She looks much better than last week. I told her she could return to driving but should not lift anything heavier than 10 lbs for 3 months postop.  Plan:  She has an appt with Dr. Tammi Klippel from Radiation Oncology in the near future. 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 The tumor was adherent to the chest wall at this site with adhesions and had to be excised from the chest wall with cautery. I think she had a complete resection. Dr. Tammi Klippel will decide about the need for XRT and longer term follow-up. I told her that if Dr. Tammi Klippel is going to follow her then she does not need to return to see me for long term follow-up. If he feels that he does not need to follow her I will be happy to do it in my office.

## 2013-02-09 DIAGNOSIS — Z483 Aftercare following surgery for neoplasm: Secondary | ICD-10-CM

## 2013-02-13 ENCOUNTER — Encounter: Payer: Self-pay | Admitting: Radiation Oncology

## 2013-02-13 NOTE — Progress Notes (Signed)
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 Alen Blew in July 2013 for anemia and leukocytosis. However, PCP has noted an elevated WBC for a while before that.  Biopsies of mediastinal soft tissue (if applicable) revealed:     Tobacco/Marijuana/Snuff/ETOH use: Never  Past/Anticipated interventions by cardiothoracic surgery, if any: median sternotomy done 01/12/2013  Past/Anticipated interventions by medical oncology, if any: Alen Blew has been managing anemia and leukocytosis since 2013.  Signs/Symptoms  Weight changes, if any: None; morbidly obese  Respiratory complaints, if any: cough  Hemoptysis, if any: None  Pain issues, if any:  Chest pain   SAFETY ISSUES:  Prior radiation? no  Pacemaker/ICD? no  Possible current pregnancy? no  Is the patient on methotrexate? no  Current Complaints / other details:  48 year old. Married. Complications following surgery include cough, nasal stuffiness, runny nose, and fever of 102F. When found on CT in October 2014 mass measured 6x10 cm.

## 2013-02-18 ENCOUNTER — Encounter: Payer: Self-pay | Admitting: Radiation Oncology

## 2013-02-18 NOTE — Progress Notes (Signed)
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  HISTORY OF PRESENT ILLNESS::Kaitlin [name] is a 48 y.o. female who initially presented with chest pain, shortness of breath, and congestion.  She was seen in the emergency room and a CT scan the chest ruled out pulmonary emboli but did show a large anterior mediastinal mass measuring approximately 6 x 10 cm.   She underwent anterior mediastinotomy at the end of October and an excisional wedge biopsy showed thymoma.  She developed a cough and shortness of breath and was sent to the ER where Chest CT to rule out PE in December demonstrates the biopsy approach/location   . 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 There was a moderate right pleural effusion with associated airspace opacity that could be atelectasis or pneumonia. Plans were made for resection by sternotomy but she was readmitted just before the planned surgery with shortness of breath and diagnosed with pneumonia.  Ultimately, she underwent and median sternotomy with resection of the thymoma on 01/12/13.  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 - ONE BENIGN LYMPH NODE (0/1).  Her case was presented for review in the [facility] conference, and rad-onc consultation was felt to be appropriate.  It should be noted though that an area of positive margin with peripleural involvement was felt to represent the initial biopsy site, according to Dr. Cyndia Bent.  As such, the capsular extension and positive margin may be considered artifact of the two-staged surgical intervention, rather than a manifestation of tumor growth/behavior.  Nevertheless, NCCN guidelines predicate  that we consider radiotherapy for positive margins, so she was referred for consultation today to consider adjuvant radiotherapy  PREVIOUS RADIATION THERAPY: No  PAST MEDICAL HISTORY:  has a past medical history of Diabetes mellitus; High cholesterol; Leukocytosis; Anemia; Obesity; Sickle cell trait; Hypertension; Bronchitis; Pneumonia; and Thymoma.    PAST SURGICAL HISTORY: Past Surgical History  Procedure Laterality Date  . Tonsillectomy    . Tubal ligation    . Mediastinotomy chamberlain mcneil Right 11/06/2012    Procedure: MEDIASTINOTOMY CHAMBERLAIN MCNEILPROCEDURE;  Surgeon: Gaye Pollack, MD;  Location: [facility] OR;  Service: Thoracic;  Laterality: Right;  . Lymph node biopsy Right 11/30/2012    Procedure: RIGHT TONSIL BIOPSY WITH FRESH FROZEN ANALYSIS;  Surgeon: Jodi Marble, MD;  Location: [place];  Service: ENT;  Laterality: Right;  . Mediasternotomy N/A 01/12/2013    Procedure: MEDIAN STERNOTOMY;  Surgeon: Gaye Pollack, MD;  Location: [place];  Service: Thoracic;  Laterality: N/A;  . Resection of a thymoma N/A 01/12/2013    Procedure: RESECTION OF A THYMOMA;  Surgeon: Gaye Pollack, MD;  Location: [facility] OR;  Service: Thoracic;  Laterality: N/A;    FAMILY HISTORY: family history includes Diabetes type II in an other family member; Heart disease in her father.  SOCIAL HISTORY:  reports that she has never smoked. She has never used smokeless tobacco. She reports that she does not drink alcohol or use illicit drugs.  ALLERGIES: Amoxicillin-pot clavulanate  MEDICATIONS:  Current Outpatient Prescriptions  Medication Sig Dispense Refill  . acetaminophen (TYLENOL) 500 MG tablet Take 1,000 mg by mouth daily as needed for moderate pain.       Marland Kitchen  albuterol (PROVENTIL HFA;VENTOLIN HFA) 108 (90 BASE) MCG/ACT inhaler Inhale 2 puffs into the lungs every 4 (four) hours as needed for wheezing.  1 Inhaler  0  . aspirin EC 325 MG EC tablet Take 1 tablet (325 mg total) by mouth daily.  30 tablet  0  .  ferrous fumarate (HEMOCYTE - 106 MG FE) 325 (106 FE) MG TABS tablet Take 1 tablet by mouth every morning.       . furosemide (LASIX) 20 MG tablet Take 1 tablet (20 mg total) by mouth daily.  30 tablet  3  . ibuprofen (ADVIL,MOTRIN) 200 MG tablet Take 800 mg by mouth daily as needed for mild pain.      Marland Kitchen insulin glargine (LANTUS) 100 UNIT/ML injection Inject 0.3 mLs (30 Units total) into the skin at bedtime.  10 mL  11  . insulin regular (NOVOLIN R,HUMULIN R) 100 units/mL injection Inject 0.3 mLs (30 Units total) into the skin 3 (three) times daily before meals.  10 mL  11  . metFORMIN (GLUCOPHAGE) 500 MG tablet Take 1 tablet (500 mg total) by mouth 2 (two) times daily with a meal.  60 tablet  1  . metoprolol tartrate (LOPRESSOR) 25 MG tablet Take 0.5 tablets (12.5 mg total) by mouth 2 (two) times daily.  30 tablet  3  . Multiple Vitamin (MULTIVITAMIN WITH MINERALS) TABS Take 1 tablet by mouth every morning.       Marland Kitchen oxyCODONE (OXY IR/ROXICODONE) 5 MG immediate release tablet Take 1-2 tablets (5-10 mg total) by mouth every 4 (four) hours as needed for severe pain.  40 tablet  0  . potassium chloride SA (K-DUR,KLOR-CON) 10 MEQ tablet Take 1 tablet (10 mEq total) by mouth daily.  30 tablet  3  . simvastatin (ZOCOR) 40 MG tablet Take 40 mg by mouth at bedtime.      Marland Kitchen Specialty Vitamins Products (VITAMINS FOR HAIR PO) Take 1 tablet by mouth every morning.       . vitamin C (ASCORBIC ACID) 500 MG tablet Take 1,000 mg by mouth once as needed (for cold).       Marland Kitchen guaiFENesin (MUCINEX) 600 MG 12 hr tablet Take 1 tablet (600 mg total) by mouth 2 (two) times daily. For cough       No current facility-administered medications for this encounter.    REVIEW OF SYSTEMS:  A 15 point review of systems is documented in the electronic medical record. This was obtained by the nursing staff. However, I reviewed this with the patient to discuss relevant findings and make appropriate changes.  A comprehensive review of  systems was negative.  She does have some dyspnea with exertion, parasternal discomfort with cough, and occasional burning or discomfort at incision site.   PHYSICAL EXAM:  height is 5\' 9"  (1.753 m) and weight is 340 lb 9.6 oz (154.495 kg). Her oral temperature is 98 F (36.7 C). Her blood pressure is 124/60 and her pulse is 99. Her respiration is 18 and oxygen saturation is 99%.   Surgican incisions are well intended.  KPS = 80  100 - Normal; no complaints; no evidence of disease. 90   - Able to carry on normal activity; minor signs or symptoms of disease. 80   - Normal activity with effort; some signs or symptoms of disease. 43   - Cares for self; unable to carry on normal activity or to do active work. 60   - Requires occasional assistance, but is able to care  for most of his personal needs. 50   - Requires considerable assistance and frequent medical care. 24   - Disabled; requires special care and assistance. 67   - Severely disabled; hospital admission is indicated although death not imminent. 40   - Very sick; hospital admission necessary; active supportive treatment necessary. 10   - Moribund; fatal processes progressing rapidly. 0     - Dead  Karnofsky DA, Abelmann Jennings, Craver LS and Burchenal [facility] [phone]) The use of the nitrogen mustards in the palliative treatment of carcinoma: with particular reference to bronchogenic carcinoma Cancer 1 634-56  LABORATORY DATA:  Lab Results  Component Value Date   WBC 10.1 01/31/2013   HGB 11.0* 01/31/2013   HCT 33.3* 01/31/2013   MCV 77.3* 01/31/2013   PLT 408* 01/31/2013   Lab Results  Component Value Date   NA 147 01/21/2013   K 4.4 01/21/2013   CL 102 01/21/2013   CO2 32 01/21/2013   Lab Results  Component Value Date   ALT 17 01/09/2013   AST 17 01/09/2013   ALKPHOS 93 01/09/2013   BILITOT 0.3 01/09/2013     RADIOGRAPHY: Dg Chest 2 View  02/08/2013   CLINICAL DATA:  Prior surgery for thymus mass.  EXAM: CHEST  2 VIEW  COMPARISON:  DG  CHEST 2 VIEW dated 01/31/2013; DG CHEST 1V PORT dated 01/17/2013  FINDINGS: Mediastinum and hilar structures are normal. Prior median sternotomy. Heart size normal. Mild basilar atelectasis. Previously identified changes of congestive heart failure have largely resolved with mild residual pleural effusions and minimal interstitial prominence.  IMPRESSION: Interim near complete resolution of congestive heart failure.   Electronically Signed   By: Marcello Moores  Register   On: 02/08/2013 10:31   Dg Chest 2 View  01/31/2013   CLINICAL DATA:  Fever and cough  EXAM: CHEST  2 VIEW  COMPARISON:  01/17/2013  FINDINGS: Median sternotomy. Cardiac enlargement. Congestive heart failure has resolved. Vascularity now normal. Small right effusion remains. Mild right lower lobe atelectasis. Negative for pulmonary edema. Mild right upper lobe atelectasis.  IMPRESSION: Improving vascular congestion and edema. Small right effusion and right lower lobe atelectasis remain.   Electronically Signed   By: Franchot Gallo M.D.   On: 01/31/2013 10:31      IMPRESSION: This patient is a very nice 48 year old woman status post median sternotomy with resection of a 10.4 cm thymoma. Her surgical margin was focally involved and there was extracapsular distention of tumor into perileural soft tissue. As above, the area of extracapsular extension and focally positive margin likely represent the original biopsy site from a procedure 2 months prior to resection.  In the setting of thymoma with extracapsular extension and/or marginal involvement, radiotherapy to the resection site should be considered according to NCCN guidelines. However, due to the circumstances surrounding these pathology findings, the addition of radiotherapy may provide limited benefit to this particular patient, and introduce significant risk of toxicities.   PLAN:Today, I talked to the patient and family about the findings and work-up thus far.  We discussed the natural history of  thymoma and general treatment, highlighting the role of adjuvant radiotherapy in the management.  We discussed the available radiation techniques, and focused on the details of logistics and delivery.  We reviewed the anticipated acute and late sequelae associated with radiation in this setting.  The patient was encouraged to ask questions that I answered to the best of my ability.  I filled out a patient counseling form during  our discussion including treatment diagrams.  We retained a copy for our records.    At this point, after a thorough discussion with the patient and her family, we have elected to forego adjuvant radiotherapy and potentially offer salvage radiotherapy in the future in the event of local recurrence.  I spent 30 minutes minutes face to face with the patient and more than 50% of that time was spent in counseling and/or2 coordination of care.  ------------------------------------------------  Sheral Apley. Tammi Klippel, M.D.

## 2013-02-19 ENCOUNTER — Encounter: Payer: Self-pay | Admitting: Radiation Oncology

## 2013-02-19 ENCOUNTER — Ambulatory Visit
Admission: RE | Admit: 2013-02-19 | Discharge: 2013-02-19 | Disposition: A | Discharge status: Home / Self Care | Payer: BC Managed Care – PPO | Source: Ambulatory Visit | Attending: Radiation Oncology | Admitting: Radiation Oncology

## 2013-02-19 VITALS — BP 124/60 | HR 99 | Temp 98.0°F | Resp 18 | Ht 69.0 in | Wt 340.6 lb

## 2013-02-19 DIAGNOSIS — D150 Benign neoplasm of thymus: Secondary | ICD-10-CM

## 2013-02-19 DIAGNOSIS — I10 Essential (primary) hypertension: Secondary | ICD-10-CM | POA: Insufficient documentation

## 2013-02-19 DIAGNOSIS — Z79899 Other long term (current) drug therapy: Secondary | ICD-10-CM | POA: Insufficient documentation

## 2013-02-19 DIAGNOSIS — D573 Sickle-cell trait: Secondary | ICD-10-CM | POA: Insufficient documentation

## 2013-02-19 DIAGNOSIS — C37 Malignant neoplasm of thymus: Secondary | ICD-10-CM | POA: Insufficient documentation

## 2013-02-19 DIAGNOSIS — Z794 Long term (current) use of insulin: Secondary | ICD-10-CM | POA: Insufficient documentation

## 2013-02-19 DIAGNOSIS — D4989 Neoplasm of unspecified behavior of other specified sites: Secondary | ICD-10-CM

## 2013-02-19 DIAGNOSIS — E119 Type 2 diabetes mellitus without complications: Secondary | ICD-10-CM | POA: Insufficient documentation

## 2013-02-19 DIAGNOSIS — Z7982 Long term (current) use of aspirin: Secondary | ICD-10-CM | POA: Insufficient documentation

## 2013-02-19 NOTE — Progress Notes (Signed)
See progress note under physician encounter. 

## 2013-02-19 NOTE — Progress Notes (Signed)
Reports chest and left leg pain 6 on a scale of 0-10. Reports heaviness on left side causes her to be off balance. Reports taking Motrin at 0900 for chest and leg pain. Reports left leg pain is related to effects of edema. Reports chest pain is related to effects of surgery. Reports occasional episodes of productive cough. Reports frequency of cough since hospital discharge has greatly decreased. Reports she tires easily. Denies shortness of breath or difficulty swallowing. Wearing chest binder. Vertical incision center of the chest well approximated without redness, drainage or edema. 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 Lives with her husband and son. Admitted with the flu following flu shot and chest xray revealed mass. Denies nausea, vomiting, or dizziness. Reports an occasional headache that feels "tight then, quickly deflates."

## 2013-02-22 ENCOUNTER — Ambulatory Visit: Payer: BC Managed Care – PPO | Admitting: Pulmonary Disease

## 2013-03-12 ENCOUNTER — Ambulatory Visit: Payer: BC Managed Care – PPO | Admitting: Pulmonary Disease

## 2013-03-29 ENCOUNTER — Ambulatory Visit (INDEPENDENT_AMBULATORY_CARE_PROVIDER_SITE_OTHER): Payer: Medicaid Other | Admitting: Pulmonary Disease

## 2013-03-29 ENCOUNTER — Encounter: Payer: Self-pay | Admitting: Pulmonary Disease

## 2013-03-29 VITALS — BP 128/86 | HR 105 | Temp 98.3°F | Ht 69.0 in | Wt 352.4 lb

## 2013-03-29 DIAGNOSIS — R609 Edema, unspecified: Secondary | ICD-10-CM

## 2013-03-29 DIAGNOSIS — R600 Localized edema: Secondary | ICD-10-CM

## 2013-03-29 MED ORDER — FUROSEMIDE 20 MG PO TABS: 20.0000 mg | ORAL_TABLET | Freq: Every day | ORAL | Status: DC

## 2013-03-29 NOTE — Patient Instructions (Addendum)
Echo test for the heart Take lasix 40 mg daily x 2 weeks , then go back to 2 mg daily Cut down  Ibuprofen use We will set you up with a medical doctor

## 2013-03-29 NOTE — Progress Notes (Signed)
   Subjective:    Patient ID: Kaitlin [name], female    DOB: 1965/10/01, 48 y.o.   MRN: [id number]  HPI  Pleasant 48 yo (never smoker)MO(350 lb) AAF who was Dx with Thymoma 10-2012 and admitted 12-11-13 for CAP and treated with abx. CT showed a moderate rt effusion,a large anterior mediastinal mass measuring approximately 6 x 10 cm.  Bedside ultrasound was done and the fluid amount was not enough for thoracentesis  Ultimately, she underwent and median sternotomy with resection of the thymoma on 01/12/13. ([place]) Duplex neg dec 2014  Minimal involvement of capsule was felt to be related to excisional biopsy & RT was deferred Gaffer)  Chief Complaint  Patient presents with  . Follow-up    c/o edema from knees down,tight in feet and legs,unsteady feels like goes to left,no sob,occass. cough-dry,no wheezing,no fcs,c/o weight gain   She is on 20 mg lasix, c/o mild dyspnea on exertion, left thigh/back pain -on ibuprofen daily Lab review-alb 3.0, renal fn ok, UA-neg protein She requests PCP   Review of Systems neg for any significant sore throat, dysphagia, itching, sneezing, nasal congestion or excess/ purulent secretions, fever, chills, sweats, unintended wt loss, pleuritic or exertional cp, hempoptysis, orthopnea pnd or change in chronic leg swelling. Also denies presyncope, palpitations, heartburn, abdominal pain, nausea, vomiting, diarrhea or change in bowel or urinary habits, dysuria,hematuria, rash, arthralgias, visual complaints, headache, numbness weakness or ataxia.     Objective:   Physical Exam Gen. Pleasant, obese, in no distress ENT - no lesions, no post nasal drip Neck: No JVD, no thyromegaly, no carotid bruits Lungs: no use of accessory muscles, no dullness to percussion, decreased without rales or rhonchi  Cardiovascular: Rhythm regular, heart sounds  normal, no murmurs or gallops, no peripheral edema Musculoskeletal: No deformities, no cyanosis or clubbing , no  tremors        Assessment & Plan:

## 2013-03-29 NOTE — Assessment & Plan Note (Signed)
Unclear cause - duplex neg , alb 3.0 , no proteinuria Echo test for the heart Take lasix 40 mg daily x 2 weeks , then go back to 2 mg daily Cut down  ibuprofen We will set you up with a medical doctor

## 2013-04-10 DIAGNOSIS — E119 Type 2 diabetes mellitus without complications: Secondary | ICD-10-CM | POA: Insufficient documentation

## 2013-04-10 DIAGNOSIS — Z794 Long term (current) use of insulin: Secondary | ICD-10-CM

## 2013-04-10 DIAGNOSIS — J069 Acute upper respiratory infection, unspecified: Secondary | ICD-10-CM | POA: Insufficient documentation

## 2013-04-10 DIAGNOSIS — Z709 Sex counseling, unspecified: Secondary | ICD-10-CM | POA: Insufficient documentation

## 2013-04-10 DIAGNOSIS — R600 Localized edema: Secondary | ICD-10-CM | POA: Insufficient documentation

## 2013-04-13 ENCOUNTER — Other Ambulatory Visit (HOSPITAL_COMMUNITY): Payer: Medicaid Other

## 2013-04-13 ENCOUNTER — Ambulatory Visit (HOSPITAL_COMMUNITY)
Admission: RE | Admit: 2013-04-13 | Discharge: 2013-04-13 | Disposition: A | Discharge status: Home / Self Care | Payer: Medicaid Other | Source: Ambulatory Visit | Attending: Pulmonary Disease | Admitting: Pulmonary Disease

## 2013-04-13 DIAGNOSIS — R600 Localized edema: Secondary | ICD-10-CM

## 2013-04-13 DIAGNOSIS — I509 Heart failure, unspecified: Secondary | ICD-10-CM

## 2013-04-13 DIAGNOSIS — R609 Edema, unspecified: Secondary | ICD-10-CM | POA: Insufficient documentation

## 2013-04-13 NOTE — Progress Notes (Signed)
  Echocardiogram 2D Echocardiogram has been performed.  Basilia Jumbo 04/13/2013, 9:37 AM

## 2013-04-16 ENCOUNTER — Telehealth: Payer: Self-pay | Admitting: Pulmonary Disease

## 2013-04-16 DIAGNOSIS — R931 Abnormal findings on diagnostic imaging of heart and coronary circulation: Secondary | ICD-10-CM

## 2013-04-16 NOTE — Telephone Encounter (Signed)
Result Note     Echo shows decreased cardiac function - this Is causing leg swelling    Suggest cardiology referral ASAP   --  I spoke with patient about results and she verbalized understanding and had no questions. Referral placed

## 2013-04-18 ENCOUNTER — Other Ambulatory Visit: Payer: Self-pay | Admitting: Medical Oncology

## 2013-04-18 MED ORDER — FERROUS FUMARATE 325 (106 FE) MG PO TABS: 1.0000 | ORAL_TABLET | Freq: Every morning | ORAL | Status: DC

## 2013-04-18 NOTE — Telephone Encounter (Signed)
Patient LVMOM requesting refill for ferrous fumarate and to have it sent to [facility] @ [address]. Reviewed with MD ok to refill for 30 tablets plus 1 refill and patient to be sched for f/u in the next two months. Patient informed, expressed thanks, and knows to expect call from scheduling regarding appt.  Per MD, POF sent.

## 2013-04-19 ENCOUNTER — Telehealth: Payer: Self-pay | Admitting: Oncology

## 2013-04-19 NOTE — Telephone Encounter (Signed)
s.w. pt and advised on may appt.....pt ok and aware °

## 2013-04-23 ENCOUNTER — Encounter: Payer: Self-pay | Admitting: *Deleted

## 2013-05-01 ENCOUNTER — Encounter: Payer: Self-pay | Admitting: Cardiology

## 2013-05-01 ENCOUNTER — Ambulatory Visit (INDEPENDENT_AMBULATORY_CARE_PROVIDER_SITE_OTHER): Payer: Medicaid Other | Admitting: Cardiology

## 2013-05-01 ENCOUNTER — Telehealth: Payer: Self-pay | Admitting: Cardiology

## 2013-05-01 VITALS — BP 130/83 | HR 102 | Ht 69.0 in | Wt 351.4 lb

## 2013-05-01 DIAGNOSIS — I423 Endomyocardial (eosinophilic) disease: Secondary | ICD-10-CM | POA: Insufficient documentation

## 2013-05-01 DIAGNOSIS — I10 Essential (primary) hypertension: Secondary | ICD-10-CM

## 2013-05-01 DIAGNOSIS — I428 Other cardiomyopathies: Secondary | ICD-10-CM

## 2013-05-01 DIAGNOSIS — I420 Dilated cardiomyopathy: Secondary | ICD-10-CM

## 2013-05-01 DIAGNOSIS — I422 Other hypertrophic cardiomyopathy: Secondary | ICD-10-CM

## 2013-05-01 HISTORY — DX: Other cardiomyopathies: I42.8

## 2013-05-01 MED ORDER — CARVEDILOL 3.125 MG PO TABS: 3.1250 mg | ORAL_TABLET | Freq: Two times a day (BID) | ORAL | Status: DC

## 2013-05-01 MED ORDER — LISINOPRIL 5 MG PO TABS: 5.0000 mg | ORAL_TABLET | Freq: Two times a day (BID) | ORAL | Status: DC

## 2013-05-01 NOTE — Progress Notes (Signed)
HPI The patient has had a complicated history recently.  She was found last fall to have a thymoma.  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  She was considered for radiation but it was decided that she did not need this.  During her evaluations she has been noted to have right pleural effusion.  She was referred to Dr. Elsworth Soho who sent her for an echo.  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  She is referred for evaluation of this.  She reports that she has had some chronic edema.  This was worse after surgery but has improved slowly.  She has had this for over a year.  She had some mild DOE prior to surgery and had increased SOB in October that lead to her diagnosis.  This was actually worse after surgery but she also had pneumonia.  She reports that she has slowly improved.  She still has some dyspnea and she rarely wakes up because she is SOB.  However, she doesn't describe classic PND or orthopnea.  The patient denies any new symptoms such as chest discomfort, neck or arm discomfort. There have been no reported palpitations, presyncope or syncope.  She denies cough, fever or chills.    Allergies  Allergen Reactions  . Amoxicillin-Pot Clavulanate Diarrhea    Current Outpatient Prescriptions  Medication Sig Dispense Refill  . acetaminophen (TYLENOL) 500 MG tablet Take 1,000 mg by mouth daily as needed for moderate pain.       Marland Kitchen albuterol (PROVENTIL HFA;VENTOLIN HFA) 108 (90 BASE) MCG/ACT inhaler Inhale 2 puffs into the lungs every 4 (four) hours as needed for wheezing.  1 Inhaler  0  . aspirin EC 325 MG EC tablet Take 1 tablet (325 mg total) by mouth daily.  30 tablet  0  . cholecalciferol (VITAMIN D) 1000 UNITS tablet Take 2,000 Units by mouth daily.      . ferrous fumarate (HEMOCYTE - 106 MG FE) 325 (106 FE) MG TABS tablet Take 1 tablet (106 mg of iron total) by mouth every morning.  30 each  1  . furosemide (LASIX) 20 MG tablet Take 20-40 mg by mouth.  Alternates 20 & 40mg       . guaiFENesin (MUCINEX) 600 MG 12 hr tablet Take 1 tablet (600 mg total) by mouth 2 (two) times daily. For cough      . ibuprofen (ADVIL,MOTRIN) 200 MG tablet Take 800 mg by mouth daily as needed for mild pain.      Marland Kitchen insulin glargine (LANTUS) 100 UNIT/ML injection Inject 0.3 mLs (30 Units total) into the skin at bedtime.  10 mL  11  . insulin regular (NOVOLIN R,HUMULIN R) 100 units/mL injection Inject 0.3 mLs (30 Units total) into the skin 3 (three) times daily before meals.  10 mL  11  . metFORMIN (GLUCOPHAGE) 500 MG tablet Take 1,000 mg by mouth 2 (two) times daily with a meal.      . metoprolol tartrate (LOPRESSOR) 25 MG tablet Take 0.5 tablets (12.5 mg total) by mouth 2 (two) times daily.  30 tablet  3  . Multiple Vitamin (MULTIVITAMIN WITH MINERALS) TABS Take 1 tablet by mouth every morning.       . potassium chloride SA (K-DUR,KLOR-CON) 10 MEQ tablet Take 1 tablet (10 mEq total) by mouth daily.  30 tablet  3  . simvastatin (ZOCOR) 40 MG tablet Take 40 mg by mouth at bedtime.      Marland Kitchen  Specialty Vitamins Products (VITAMINS FOR HAIR PO) Take 1 tablet by mouth every morning.        No current facility-administered medications for this visit.    Past Medical History  Diagnosis Date  . Diabetes mellitus   . High cholesterol   . Leukocytosis   . Anemia   . Obesity   . Sickle cell trait   . Hypertension     takes medicine to protect kidneys, does not have HTN  . Bronchitis   . Pneumonia   . Thymoma     Past Surgical History  Procedure Laterality Date  . Tonsillectomy    . Tubal ligation    . Mediastinotomy chamberlain mcneil Right 11/06/2012    Procedure: MEDIASTINOTOMY CHAMBERLAIN MCNEILPROCEDURE;  Surgeon: Gaye Pollack, MD;  Location: [facility] OR;  Service: Thoracic;  Laterality: Right;  . Lymph node biopsy Right 11/30/2012    Procedure: RIGHT TONSIL BIOPSY WITH FRESH FROZEN ANALYSIS;  Surgeon: Jodi Marble, MD;  Location: [place];  Service: ENT;   Laterality: Right;  . Mediasternotomy N/A 01/12/2013    Procedure: MEDIAN STERNOTOMY;  Surgeon: Gaye Pollack, MD;  Location: [place];  Service: Thoracic;  Laterality: N/A;  . Resection of a thymoma N/A 01/12/2013    Procedure: RESECTION OF A THYMOMA;  Surgeon: Gaye Pollack, MD;  Location: [facility] OR;  Service: Thoracic;  Laterality: N/A;    Family History  Problem Relation Age of Onset  . Heart disease Father   . Diabetes type II      Family HX    History   Social History  . Marital Status: Married    Spouse Name: N/A    Number of Children: 3  . Years of Education: N/A   Occupational History  . Comptroller   Social History Main Topics  . Smoking status: Passive Smoke Exposure - Never Smoker  . Smokeless tobacco: Never Used  . Alcohol Use: No  . Drug Use: No  . Sexual Activity: Yes    Birth Control/ Protection: Surgical   Other Topics Concern  . Not on file   Social History Narrative  . No narrative on file    ROS:  As stated in the HPI and negative for all other systems.  PHYSICAL EXAM BP 130/83  Pulse 102  Ht 5\' 9"  (1.753 m)  Wt 351 lb 6.4 oz (159.394 kg)  BMI 51.87 kg/m2  LMP 06/17/2012 GENERAL:  Well appearing HEENT:  Pupils equal round and reactive, fundi not visualized, oral mucosa unremarkable NECK:  No jugular venous distention, waveform within normal limits, carotid upstroke brisk and symmetric, no bruits, no thyromegaly LYMPHATICS:  No cervical, inguinal adenopathy LUNGS:  Clear to auscultation bilaterally BACK:  No CVA tenderness CHEST:  Unremarkable HEART:  PMI not displaced or sustained,S1 and S2 within normal limits, no S3, no S4, no clicks, no rubs, no murmurs ABD:  Flat, positive bowel sounds normal in frequency in pitch, no bruits, no rebound, no guarding, no midline pulsatile mass, no hepatomegaly, no splenomegaly EXT:  2 plus pulses throughout, mild edema, no cyanosis no clubbing SKIN:  No rashes no nodules NEURO:  Cranial nerves  II through XII grossly intact, motor grossly intact throughout PSYCH:  Cognitively intact, oriented to person place and time   EKG:  NSR, rate 102, leftward axis, poor anterior R wave progression, premature ectopic complex, QTC prolonged.  05/01/2013  ASSESSMENT AND PLAN  CARDIOMYOPATHY:   I do not suspect an  ischemic etiology.  However, I will need to exclude this with Lexiscan Myoview.  She would not be able to walk on a treadmill.  I will begin to titrate her meds.  I will restart lisinopril and change to Coreg.  These can be titrated slowly.  I will follow up an echo in the months to come once meds have been titrated.  We discussed PRN dosing of her diuretic.  We reviewed salt and fluid restriction.  She will come back in two weeks to see an APP for med titration.  I will see her in about a month.  She will need a BMET in two months when she returns.    PROLONGED QT:  She has no symptoms related to this.  No change in therapy is indicated.

## 2013-05-01 NOTE — Patient Instructions (Signed)
Please stop Metoprolol. Start Carvedilol 3.125 mg twice a day, Start Lisinopril 5 mg twice a day. Continue all other medications as listed.  Your physician has requested that you have a lexiscan myoview. For further information please visit [email or web address]. Please follow instruction sheet, as given.  Follow up in 2 weeks with a PA for medication titration.  Follow up in 1 month with Dr Percival Spanish.

## 2013-05-01 NOTE — Telephone Encounter (Signed)
Was just at the pharmacy and the stated that they did not get the prescription for the 5 mg of Lisinopril .Marland Kitchen Please re send .Marland Kitchen Call if you have any more questions .Marland Kitchen   Thanks

## 2013-05-01 NOTE — Telephone Encounter (Signed)
I can see where the orders were placed for these medications and sent into pharmacy as requested.  Will resend.

## 2013-05-02 ENCOUNTER — Telehealth: Payer: Self-pay | Admitting: Cardiology

## 2013-05-02 NOTE — Telephone Encounter (Signed)
Disability paperwork has been received and completed by Dr Percival Spanish - pt is aware and paperwork will be faxed as requested.

## 2013-05-02 NOTE — Telephone Encounter (Signed)
Pt wants to know if Pam received the papers from [facility]? She needs her to fax her job and let them know her condition and her status please. Please fax [phone] HUT:[id number] Alroy Dust.

## 2013-05-02 NOTE — Telephone Encounter (Signed)
Message forwarded to [employer]

## 2013-05-07 ENCOUNTER — Telehealth: Payer: Self-pay | Admitting: Cardiology

## 2013-05-07 NOTE — Telephone Encounter (Signed)
Pt called back with fax # [phone]  [place] # [id number] Revonda Humphrey at [place].

## 2013-05-07 NOTE — Telephone Encounter (Signed)
New message    Need office notes from April 1 to present faxed to [phone].  For disability.  She did not want to leave this message with medical records--she wanted Pam only

## 2013-05-07 NOTE — Telephone Encounter (Signed)
New message     Patient calling back regarding  Insurance company need office notes.

## 2013-05-07 NOTE — Telephone Encounter (Signed)
Pt to call back with phone number to call back to [facility].

## 2013-05-07 NOTE — Telephone Encounter (Signed)
Left message for Kaitlin [name] that I have faxed her what we have.  We have only seen the pt 4/21 and I have faxed that note today and the disability last week.  Any other notes or test results she needs will have to be obtained from the MD that ordered it.

## 2013-05-07 NOTE — Telephone Encounter (Signed)
Office visit note faxed as requested.

## 2013-05-08 ENCOUNTER — Telehealth (HOSPITAL_COMMUNITY): Payer: Self-pay

## 2013-05-10 ENCOUNTER — Ambulatory Visit (HOSPITAL_COMMUNITY)
Admission: RE | Admit: 2013-05-10 | Discharge: 2013-05-10 | Disposition: A | Discharge status: Home / Self Care | Payer: Medicaid Other | Source: Ambulatory Visit | Attending: Internal Medicine | Admitting: Internal Medicine

## 2013-05-10 DIAGNOSIS — I422 Other hypertrophic cardiomyopathy: Secondary | ICD-10-CM | POA: Insufficient documentation

## 2013-05-10 DIAGNOSIS — I10 Essential (primary) hypertension: Secondary | ICD-10-CM | POA: Insufficient documentation

## 2013-05-10 MED ORDER — TECHNETIUM TC 99M SESTAMIBI GENERIC - CARDIOLITE
31.0000 | Freq: Once | INTRAVENOUS | Status: AC | PRN
  Administered 2013-05-10: 31 via INTRAVENOUS

## 2013-05-10 MED ORDER — AMINOPHYLLINE 25 MG/ML IV SOLN
75.0000 mg | Freq: Once | INTRAVENOUS | Status: AC
  Administered 2013-05-10: 75 mg via INTRAVENOUS

## 2013-05-10 MED ORDER — REGADENOSON 0.4 MG/5ML IV SOLN
0.4000 mg | Freq: Once | INTRAVENOUS | Status: AC
  Administered 2013-05-10: 0.4 mg via INTRAVENOUS

## 2013-05-10 NOTE — Procedures (Addendum)
[facility] [address] [place] [address] [phone]  Cardiology Nuclear Med Study  Kaitlin [name] is a 48 y.o. female     MRN : [id number]     DOB: Apr 18, 1965  Procedure Date: 05/10/2013  Nuclear Med Background Indication for Stress Test:  Evaluation for Ischemia, [facility] and Abnormal EKG History:  cardiomyopathy-EF=25%;No prior NUC MPI for comparison;ECHO on 04/13/2013-EF=25-30% Cardiac Risk Factors: Family History - CAD, Hypertension, IDDM Type 2, Lipids and Obesity  Symptoms:  Chest Pain, DOE, Fatigue and SOB   Nuclear Pre-Procedure Caffeine/Decaff Intake:  7:00pm NPO After: 5:00am   IV Site: R Hand  IV 0.9% NS with Angio Cath:  22g  Chest Size (in):  n/a IV Started by: Rolene Course, RN  Height: 5\' 9"  (1.753 m)  Cup Size: DDD  BMI:  Body mass index is 51.81 kg/(m^2). Weight:  351 lb (159.213 kg)   Tech Comments:  n/a    Nuclear Med Study 1 or 2 day study: 2 day  Stress Test Type:  [place] Provider:  Minus Breeding, MD   Resting Radionuclide: Technetium 40m Sestamibi  Resting Radionuclide Dose: 32.0 mCi   Stress Radionuclide:  Technetium 65m Sestamibi  Stress Radionuclide Dose: 31.0 mCi           Stress Protocol Rest HR: 99 Stress HR: 111  Rest BP: 113/74 Stress BP: 123/64  Exercise Time (min): n/a METS: n/a   Predicted Max HR: 175 bpm % Max HR: 65.71 bpm Rate Pressure Product: 14145  Dose of Adenosine (mg):  n/a Dose of Lexiscan: 0.4 mg  Dose of Atropine (mg): n/a Dose of Dobutamine: n/a mcg/kg/min (at max HR)  Stress Test Technologist: Leane Para, CCT Nuclear Technologist: Imagene Riches, CNMT   Rest Procedure:  Myocardial perfusion imaging was performed at rest 45 minutes following the intravenous administration of Technetium 45m Sestamibi. Stress Procedure:  The patient received IV Lexiscan 0.4 mg over 15-seconds.  Technetium 53m Sestamibi injected at 30-seconds.   Patient experienced  SOB, chest pressure and stomach pain and 75 mg of Aminophylline was administered at 5 minutes.  There were no significant changes with Lexiscan.  Quantitative spect images were obtained after a 45 minute delay.  Transient Ischemic Dilatation (Normal <1.22):  0.71 Lung/Heart Ratio (Normal <0.45):  0.22 QGS EDV:  126 ml QGS ESV:  89 ml LV Ejection Fraction: 29%       Rest ECG: NSR - Normal EKG  Stress ECG: No significant change from baseline ECG  QPS Raw Data Images:  Normal; no motion artifact; normal heart/lung ratio. Stress Images:  Small to moderate in size and intensity defect involving the mid-distal anteroseptal and distal to basal inferolateral walls Rest Images:  Improved perfusion anteroseptally and inferolaterally with mild diaphragmatic attenuation Subtraction (SDS):  These findings are consistent with ischemia.  Impression Exercise Capacity:  Lexiscan with no exercise. BP Response:  Normal blood pressure response. Clinical Symptoms:  Patient developed chest pressure and shortness of breath. ECG Impression:  No significant ST segment change suggestive of ischemia. Comparison with Prior Nuclear Study: No images to compare  Overall Impression:  High risk stress nuclear study demonstrating moderate ischemia involving the mid-distal anteroseptal and distal to basal inferolateral wall.  LV Wall Motion:  Severe LV dysfunction with EF 29% and hypo-akinesis of the the mid -distal anteroseptal wall and apex. Clinical correlation is recommended. Findings are c/w ischemic cardiomyopathy.   Troy Sine, MD  05/11/2013  3:00 PM

## 2013-05-11 ENCOUNTER — Ambulatory Visit (HOSPITAL_COMMUNITY)
Admission: RE | Admit: 2013-05-11 | Discharge: 2013-05-11 | Disposition: A | Discharge status: Home / Self Care | Payer: Medicaid Other | Source: Ambulatory Visit | Attending: Cardiovascular Disease | Admitting: Cardiovascular Disease

## 2013-05-11 DIAGNOSIS — I422 Other hypertrophic cardiomyopathy: Secondary | ICD-10-CM | POA: Insufficient documentation

## 2013-05-11 DIAGNOSIS — I10 Essential (primary) hypertension: Secondary | ICD-10-CM | POA: Insufficient documentation

## 2013-05-11 DIAGNOSIS — I428 Other cardiomyopathies: Secondary | ICD-10-CM

## 2013-05-11 MED ORDER — TECHNETIUM TC 99M SESTAMIBI GENERIC - CARDIOLITE
32.0000 | Freq: Once | INTRAVENOUS | Status: AC | PRN
  Administered 2013-05-11: 32 via INTRAVENOUS

## 2013-05-18 ENCOUNTER — Ambulatory Visit (INDEPENDENT_AMBULATORY_CARE_PROVIDER_SITE_OTHER): Payer: Medicaid Other | Admitting: Cardiology

## 2013-05-18 ENCOUNTER — Encounter: Payer: Self-pay | Admitting: Cardiovascular Disease

## 2013-05-18 VITALS — BP 110/70 | HR 80

## 2013-05-18 DIAGNOSIS — R9439 Abnormal result of other cardiovascular function study: Secondary | ICD-10-CM

## 2013-05-18 DIAGNOSIS — I10 Essential (primary) hypertension: Secondary | ICD-10-CM

## 2013-05-18 DIAGNOSIS — Z01818 Encounter for other preprocedural examination: Secondary | ICD-10-CM

## 2013-05-18 DIAGNOSIS — I428 Other cardiomyopathies: Secondary | ICD-10-CM

## 2013-05-18 DIAGNOSIS — R5381 Other malaise: Secondary | ICD-10-CM

## 2013-05-18 DIAGNOSIS — I420 Dilated cardiomyopathy: Secondary | ICD-10-CM

## 2013-05-18 DIAGNOSIS — D689 Coagulation defect, unspecified: Secondary | ICD-10-CM

## 2013-05-18 DIAGNOSIS — I5022 Chronic systolic (congestive) heart failure: Secondary | ICD-10-CM

## 2013-05-18 DIAGNOSIS — R5383 Other fatigue: Secondary | ICD-10-CM

## 2013-05-18 DIAGNOSIS — Z79899 Other long term (current) drug therapy: Secondary | ICD-10-CM

## 2013-05-18 LAB — COMPREHENSIVE METABOLIC PANEL
ALBUMIN: 4 g/dL (ref 3.5–5.2)
ALT: 12 U/L (ref 0–35)
AST: 13 U/L (ref 0–37)
Alkaline Phosphatase: 113 U/L (ref 39–117)
BUN: 19 mg/dL (ref 6–23)
CALCIUM: 9.8 mg/dL (ref 8.4–10.5)
CHLORIDE: 103 meq/L (ref 96–112)
CO2: 29 mEq/L (ref 19–32)
Creat: 1.01 mg/dL (ref 0.50–1.10)
Glucose, Bld: 166 mg/dL — ABNORMAL HIGH (ref 70–99)
POTASSIUM: 4.2 meq/L (ref 3.5–5.3)
Sodium: 142 mEq/L (ref 135–145)
Total Bilirubin: 0.4 mg/dL (ref 0.2–1.2)
Total Protein: 6.3 g/dL (ref 6.0–8.3)

## 2013-05-18 LAB — CBC
HCT: 39.7 % (ref 36.0–46.0)
Hemoglobin: 12.9 g/dL (ref 12.0–15.0)
MCH: 23.8 pg — ABNORMAL LOW (ref 26.0–34.0)
MCHC: 32.5 g/dL (ref 30.0–36.0)
MCV: 73.2 fL — ABNORMAL LOW (ref 78.0–100.0)
Platelets: 414 10*3/uL — ABNORMAL HIGH (ref 150–400)
RBC: 5.42 MIL/uL — ABNORMAL HIGH (ref 3.87–5.11)
RDW: 18.5 % — AB (ref 11.5–15.5)
WBC: 13.4 10*3/uL — ABNORMAL HIGH (ref 4.0–10.5)

## 2013-05-18 LAB — APTT: APTT: 37 s (ref 24–37)

## 2013-05-18 LAB — PROTIME-INR
INR: 1.01 (ref <1.50)
Prothrombin Time: 13.2 seconds (ref 11.6–15.2)

## 2013-05-18 MED ORDER — LISINOPRIL 5 MG PO TABS: 10.0000 mg | ORAL_TABLET | Freq: Two times a day (BID) | ORAL | Status: DC

## 2013-05-18 MED ORDER — NITROGLYCERIN 0.4 MG SL SUBL: 0.4000 mg | SUBLINGUAL_TABLET | SUBLINGUAL | Status: DC | PRN

## 2013-05-18 MED ORDER — CARVEDILOL 3.125 MG PO TABS: 6.2500 mg | ORAL_TABLET | Freq: Two times a day (BID) | ORAL | Status: DC

## 2013-05-18 NOTE — Assessment & Plan Note (Signed)
euvolemic on physical exam.

## 2013-05-18 NOTE — Progress Notes (Signed)
Patient ID: Kaitlin [name], female   DOB: January 02, 1966, 48 y.o.   MRN: [id number]    05/18/2013 Kaitlin [name]   [birth year]  [id number]  Primary Physicia Pcp Not In System Primary Cardiologist: Dr. 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 During her w/u for her thymoma, she was noted to have a right pleural effusion. She was referred to Dr. Elsworth Soho who sent her for an echo. 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 He then referred her to Dr. Percival Spanish.  During his evaluation on 05/01/13, she had endorsed chronic LEE, as well as DOE, orthopnea and PND.  He opted to change her BB from Metoprolol to Coreg and also initiated lisinopril. She was continued on Lasix. He ordered a NST. This was completed on 05/10/13. This was interpreted as a high risk stress nuclear study, demonstrating moderate ischemia involving the mid-distal anteroseptal and distal to basal inferolateral wall. EF was estimated at 29%.  She returns to clinic today to review her test results and also for medication titration. She states that she has been doing "ok". She continues to have DOE and occasional LEE. Her orthopnea and PND have improved. She also endorses SSCP, described as "pressure-like", with exertional activities, most notable when walking up steep hills/inclines. Her symptoms are relieved with rest. She denies any resting angina. No dsypnea at rest. She denies syncope/ near syncope. She reports full medication compliance. She is trying to avoid high sodium foods. She has not been weighing herself daily.     Current Outpatient Prescriptions  Medication Sig Dispense Refill  . acetaminophen (TYLENOL) 500 MG tablet Take 1,000 mg by mouth daily as needed (pain).       Marland Kitchen albuterol (PROVENTIL HFA;VENTOLIN HFA) 108 (90 BASE) MCG/ACT inhaler Inhale 2 puffs into the lungs every 4 (four) hours as needed for wheezing.  1  Inhaler  0  . aspirin EC 325 MG EC tablet Take 1 tablet (325 mg total) by mouth daily.  30 tablet  0  . carvedilol (COREG) 3.125 MG tablet Take 2 tablets (6.25 mg total) by mouth 2 (two) times daily.  60 tablet  3  . cetirizine (ZYRTEC) 10 MG tablet Take 10 mg by mouth daily.      . cholecalciferol (VITAMIN D) 1000 UNITS tablet Take 2,000 Units by mouth daily.      . ferrous fumarate (HEMOCYTE - 106 MG FE) 325 (106 FE) MG TABS tablet Take 1 tablet (106 mg of iron total) by mouth every morning.  30 each  1  . furosemide (LASIX) 20 MG tablet Take 20-40 mg by mouth daily. Alternates 20 & 40mg       . ibuprofen (ADVIL,MOTRIN) 200 MG tablet Take 800 mg by mouth daily as needed for mild pain.      Marland Kitchen insulin glargine (LANTUS) 100 UNIT/ML injection Inject 0.3 mLs (30 Units total) into the skin at bedtime.  10 mL  11  . metFORMIN (GLUCOPHAGE) 500 MG tablet Take 1,000 mg by mouth 2 (two) times daily with a meal.      . Multiple Vitamin (MULTIVITAMIN WITH MINERALS) TABS Take 1 tablet by mouth every morning.       . potassium chloride SA (K-DUR,KLOR-CON) 10 MEQ tablet Take 1 tablet (10 mEq total) by mouth daily.  30 tablet  3  . simvastatin (ZOCOR) 40 MG tablet Take 40 mg by  mouth at bedtime.      . Specialty Vitamins Products (VITAMINS FOR HAIR PO) Take 1 tablet by mouth every morning.       . benzonatate (TESSALON PERLES) 100 MG capsule Take 1 capsule (100 mg total) by mouth 3 (three) times daily as needed for cough.  20 capsule  0  . benzonatate (TESSALON) 100 MG capsule Take 100 mg by mouth 3 (three) times daily as needed for cough.      . guaiFENesin (MUCINEX) 600 MG 12 hr tablet Take 600 mg by mouth 2 (two) times daily as needed for cough.      . insulin regular (NOVOLIN R,HUMULIN R) 100 units/mL injection Inject 20-30 Units into the skin 3 (three) times daily before meals. SSI      . lisinopril (PRINIVIL,ZESTRIL) 5 MG tablet Take 1 tablet (5 mg total) by mouth 2 (two) times daily.  60 tablet  3  .  nitroGLYCERIN (NITROSTAT) 0.4 MG SL tablet Place 1 tablet (0.4 mg total) under the tongue every 5 (five) minutes as needed for chest pain.  90 tablet  3   No current facility-administered medications for this visit.    Allergies  Allergen Reactions  . Amoxicillin-Pot Clavulanate Diarrhea    History   Social History  . Marital Status: Married    Spouse Name: N/A    Number of Children: 3  . Years of Education: N/A   Occupational History  . Retail manager     [employer]   Social History Main Topics  . Smoking status: Passive Smoke Exposure - Never Smoker  . Smokeless tobacco: Never Used  . Alcohol Use: No  . Drug Use: No  . Sexual Activity: Yes    Birth Control/ Protection: Surgical   Other Topics Concern  . Not on file   Social History Narrative  . No narrative on file     Review of Systems: General: negative for chills, fever, night sweats or weight changes.  Cardiovascular: negative for chest pain, dyspnea on exertion, edema, orthopnea, palpitations, paroxysmal nocturnal dyspnea or shortness of breath Dermatological: negative for rash Respiratory: negative for cough or wheezing Urologic: negative for hematuria Abdominal: negative for nausea, vomiting, diarrhea, bright red blood per rectum, melena, or hematemesis Neurologic: negative for visual changes, syncope, or dizziness All other systems reviewed and are otherwise negative except as noted above.    Blood pressure 110/70, pulse 80, last menstrual period 06/17/2012.  General appearance: alert, cooperative, no distress and morbidly obese Neck: no carotid bruit and no JVD Lungs: clear to auscultation bilaterally Heart: regular rate and rhythm, S1, S2 normal, no murmur, click, rub or gallop Extremities: no LEE Pulses: 2+ and symmetric Skin: warm and dry Neurologic: Grossly normal  EKG not performed today  NST 05/11/13  Overall Impression: High risk stress nuclear study demonstrating moderate ischemia involving  the mid-distal anteroseptal and  distal to basal inferolateral wall.  LV Wall Motion: Severe LV dysfunction with EF 29% and  hypo-akinesis of the the mid -distal anteroseptal wall and apex.  Clinical correlation is recommended. Findings are c/w ischemic  cardiomyopathy.   ASSESSMENT AND PLAN:   Congestive dilated cardiomyopathy Etiology unknown. W/u still pending. She is euvolemic on physical exam. Continue current HF meds: diuretic, BB and ACE. She is on low dose Coreg, 3.125 mg BID and low dose lisinopril, 5 mg daily. Her BP today is 110/70. HR is 80 bpm. She denies dizziness. We will attempt to titrate Coreg to 6.25 mg BID and lisinopril   to 10 mg. Pt was instructed to monitor BP closely. She is to notify our office if she becomes hypotensive or starts to develop symptoms. Will also plan for her to come back for a BP check in 3 days with our office pharmacist to see how she is tolerating the change and to reduce dose if needed. Ideal goal would be to titrate Coreg to 25 mg BID and Lisinopril to 40 mg daily. We also discussed the importance of daily weights and low sodium diet. She was instructed to contact our office for lasix dosing instructions if weight increases > 3 lb beyond dry weight in a 24 hr period.   Abnormal nuclear stress test She notes what sounds to be stable angina with exertional activities. We will plan for diagnostic outpatient LHC for definitive assessment of coronaries and to help determine if cardiomyopathy is ischemic vs nonischemic. I have ordered PRN SL NTG and discussed with her the proper use of NTG and when to call 911.  Hypertension 110/70 in office today. On Coreg and Lisinopril for HF. Plan is to increase HF meds today. Pt instructed to monitor BP at home closely over the weekend. F/U in 3 days for office BP check. She is to reduce both Coreg and lisinopril back down to original dose if she becomes symptomatic before f/u.    PLAN  Titrating HF meds, in the  setting of severely reduced systolic function. Will arrange an outpatient diagnostic LHC, in the setting of newly reduced EF, abnormal stress test and stable angina. PRN NTG ordered. She is scheduled for f/u with Dr. Hochrein on 5/20. Will see if we can get LHC scheduled prior to her next office visit.   Kaitlin Heilman SimmonsPA-C 05/18/2013 1:31 PM  

## 2013-05-18 NOTE — Assessment & Plan Note (Signed)
Plan for diagnostic outpatient LHC for definitive assessment of coronaries and to help determine if cardiomyopathy is ischemic vs nonischemic.

## 2013-05-18 NOTE — Patient Instructions (Signed)
1. We are going to schedule a left heart cath hopefully before the 20th of this month before you see Dr. Percival Spanish  2. We will also schedule you an appt. To have a BP check with kristain our Pharmacist either Monday or Tuesday  3.We are calling in a script for nitro glycerin for Chest Pain take as directed  4. We have changed you dosing on your lisinopril and your carvedilol  5. Lisinopril to 10 mg two times a day and carvedilol 6.25 mg two times a day

## 2013-05-19 LAB — URINALYSIS, MICROSCOPIC ONLY
BACTERIA UA: NONE SEEN
CRYSTALS: NONE SEEN
Casts: NONE SEEN

## 2013-05-21 ENCOUNTER — Encounter: Payer: Self-pay | Admitting: Pharmacist Clinician (PhC)/ Clinical Pharmacy Specialist

## 2013-05-21 ENCOUNTER — Encounter (HOSPITAL_COMMUNITY): Payer: Self-pay | Admitting: Pharmacy Technician

## 2013-05-21 ENCOUNTER — Ambulatory Visit (INDEPENDENT_AMBULATORY_CARE_PROVIDER_SITE_OTHER): Payer: Medicaid Other | Admitting: Pharmacist Clinician (PhC)/ Clinical Pharmacy Specialist

## 2013-05-21 VITALS — BP 92/58 | HR 84 | Ht 69.0 in | Wt 349.0 lb

## 2013-05-21 DIAGNOSIS — I10 Essential (primary) hypertension: Secondary | ICD-10-CM

## 2013-05-21 DIAGNOSIS — I5022 Chronic systolic (congestive) heart failure: Secondary | ICD-10-CM

## 2013-05-21 MED ORDER — LISINOPRIL 5 MG PO TABS: 5.0000 mg | ORAL_TABLET | Freq: Two times a day (BID) | ORAL | Status: DC

## 2013-05-21 NOTE — Patient Instructions (Signed)
Return for a a follow up appointment on May 20 with Dr. Percival Spanish  Your blood pressure today is 100/62  Take your BP meds as follows: decrease lisinopril back to 5mg  twice daily, continue carvedilol at 6.25mg  twice daily

## 2013-05-21 NOTE — Progress Notes (Signed)
05/21/2013 ARMENTHA BRANAGAN 1965-01-20 [id number]   HPI:  Kaitlin [name] is a 48 y.o. female patient of Dr Percival Spanish, with a PMH below who presents today for a blood pressure check.  [name] was found to have a pleural effusion on workup for thymoma.  An echo was performed and she was found to have dilated cardiomyopathy with an EF of 25%.  She also reports some chronic edema, which has improved with furosemide.  When she saw Dr. Percival Spanish on April 21, he started her on lisinopril 5mg  twice daily and carvedilol 3.125mg  twice daily.  She had a follow up with Ellen Henri on Friday when the doses of each were doubled.  Her blood pressure at that appointment was 110/70 with a heart rate of 80.  She is back today for a blood pressure check.   [name] reports some fatigue, which she has had for several months now.  It has not gotten any worse since starting the beta blocker.  She has no dizziness.  She is not a smoker, although admits to being exposed to regular second-hand smoke.  She does not drink alcohol or caffeine, and has cut salt out of her diet in the past several months.  She is not currently doing any exercise.  She has a cough today, believes it to be related to allergies, and recently started using Mucinex.   Current Outpatient Prescriptions  Medication Sig Dispense Refill  . acetaminophen (TYLENOL) 500 MG tablet Take 1,000 mg by mouth daily as needed (pain).       Marland Kitchen albuterol (PROVENTIL HFA;VENTOLIN HFA) 108 (90 BASE) MCG/ACT inhaler Inhale 2 puffs into the lungs every 4 (four) hours as needed for wheezing.  1 Inhaler  0  . aspirin EC 325 MG EC tablet Take 1 tablet (325 mg total) by mouth daily.  30 tablet  0  . benzonatate (TESSALON) 100 MG capsule Take 100 mg by mouth 3 (three) times daily as needed for cough.      . carvedilol (COREG) 3.125 MG tablet Take 2 tablets (6.25 mg total) by mouth 2 (two) times daily.  60 tablet  3  . cetirizine (ZYRTEC) 10 MG tablet Take 10 mg by mouth  daily.      . cholecalciferol (VITAMIN D) 1000 UNITS tablet Take 2,000 Units by mouth daily.      . ferrous fumarate (HEMOCYTE - 106 MG FE) 325 (106 FE) MG TABS tablet Take 1 tablet (106 mg of iron total) by mouth every morning.  30 each  1  . furosemide (LASIX) 20 MG tablet Take 20-40 mg by mouth daily. Alternates 20 & 40mg       . guaiFENesin (MUCINEX) 600 MG 12 hr tablet Take 600 mg by mouth 2 (two) times daily as needed for cough.      Marland Kitchen ibuprofen (ADVIL,MOTRIN) 200 MG tablet Take 800 mg by mouth daily as needed for mild pain.      Marland Kitchen insulin glargine (LANTUS) 100 UNIT/ML injection Inject 0.3 mLs (30 Units total) into the skin at bedtime.  10 mL  11  . insulin regular (NOVOLIN R,HUMULIN R) 100 units/mL injection Inject 20-30 Units into the skin 3 (three) times daily before meals. SSI      . lisinopril (PRINIVIL,ZESTRIL) 5 MG tablet Take 2 tablets (10 mg total) by mouth 2 (two) times daily.  60 tablet  3  . metFORMIN (GLUCOPHAGE) 500 MG tablet Take 1,000 mg by mouth 2 (two) times daily with  a meal.      . Multiple Vitamin (MULTIVITAMIN WITH MINERALS) TABS Take 1 tablet by mouth every morning.       . nitroGLYCERIN (NITROSTAT) 0.4 MG SL tablet Place 1 tablet (0.4 mg total) under the tongue every 5 (five) minutes as needed for chest pain.  90 tablet  3  . potassium chloride SA (K-DUR,KLOR-CON) 10 MEQ tablet Take 1 tablet (10 mEq total) by mouth daily.  30 tablet  3  . simvastatin (ZOCOR) 40 MG tablet Take 40 mg by mouth at bedtime.      Marland Kitchen Specialty Vitamins Products (VITAMINS FOR HAIR PO) Take 1 tablet by mouth every morning.        No current facility-administered medications for this visit.    Allergies  Allergen Reactions  . Amoxicillin-Pot Clavulanate Diarrhea    Past Medical History  Diagnosis Date  . Diabetes mellitus   . High cholesterol   . Leukocytosis   . Anemia   . Obesity   . Sickle cell trait   . Hypertension     takes medicine to protect kidneys, does not have HTN  .  Bronchitis   . Pneumonia   . Thymoma     Blood pressure 92/58, pulse 84, height 5\' 9"  (1.753 m), weight 349 lb (158.305 kg), last menstrual period 06/17/2012.  Standing 104/64   ASSESSMENT AND PLAN:  Today [name] blood pressure is quite low.  A repeat reading was slightly higher at 100/62.  Again, she reports no lightheadedness or presyncope.  I have asked her to cut the lisinopril back to 5 mg twice daily, but to leave the carvedilol at 6.25mg .  Her cough does not sound like the usual ACEI cough, but we will watch to see if it resolves before her appointment with Dr. Percival Spanish later this month.    Tommy Medal PharmD CPP [facility]

## 2013-05-24 ENCOUNTER — Other Ambulatory Visit: Payer: Self-pay | Admitting: *Deleted

## 2013-05-24 ENCOUNTER — Encounter: Payer: Self-pay | Admitting: Oncology

## 2013-05-24 ENCOUNTER — Encounter: Payer: Self-pay | Admitting: Cardiology

## 2013-05-24 ENCOUNTER — Ambulatory Visit (HOSPITAL_BASED_OUTPATIENT_CLINIC_OR_DEPARTMENT_OTHER): Payer: Medicaid Other | Admitting: Oncology

## 2013-05-24 VITALS — BP 131/56 | HR 103 | Temp 98.4°F | Resp 22 | Ht 69.0 in | Wt 351.9 lb

## 2013-05-24 DIAGNOSIS — D4989 Neoplasm of unspecified behavior of other specified sites: Secondary | ICD-10-CM

## 2013-05-24 DIAGNOSIS — R059 Cough, unspecified: Secondary | ICD-10-CM

## 2013-05-24 DIAGNOSIS — R9431 Abnormal electrocardiogram [ECG] [EKG]: Secondary | ICD-10-CM

## 2013-05-24 DIAGNOSIS — D150 Benign neoplasm of thymus: Principal | ICD-10-CM

## 2013-05-24 DIAGNOSIS — Z01818 Encounter for other preprocedural examination: Secondary | ICD-10-CM

## 2013-05-24 DIAGNOSIS — R718 Other abnormality of red blood cells: Secondary | ICD-10-CM

## 2013-05-24 DIAGNOSIS — R0989 Other specified symptoms and signs involving the circulatory and respiratory systems: Secondary | ICD-10-CM

## 2013-05-24 DIAGNOSIS — R05 Cough: Secondary | ICD-10-CM

## 2013-05-24 DIAGNOSIS — C37 Malignant neoplasm of thymus: Secondary | ICD-10-CM

## 2013-05-24 MED ORDER — BENZONATATE 100 MG PO CAPS: 100.0000 mg | ORAL_CAPSULE | Freq: Three times a day (TID) | ORAL | Status: DC | PRN

## 2013-05-24 NOTE — Progress Notes (Signed)
Hematology and Oncology Follow Up Visit  Kaitlin [name] [id number] 1965/08/31 48 y.o. 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 She presented with a large mediastinal mass and shortness of breath.  Past therapy: She is status post median sternotomy with resection of the thymoma on 01/12/13.   Current therapy: Observation and surveillance.  Interim History: Kaitlin [name] presents today for a follow up visit.  Since her last visit, she underwent resection of her thymoma and have recovered fairly well. Her tumor was a T2 N0 with focal involvement of the surgical margin. Patient was evaluated by Dr. Tammi Klippel and elected not to proceed with adjuvant radiation and utilize that as a salvage if needed to. Clinically, she have developed recently symptoms of shortness of breath and fluid retention and she is currently undergoing cardiac evaluation. She is scheduled to have a cardiac catheterization on 05/25/2013. She continues to have sinus congestion and pressure. She also have had some problems with shortness of breath and fluid retention. Lasix have helped with the symptoms but she still have some congestion at times. She does report some occasional exertional dyspnea. She is not reporting any headaches or blurry vision or syncope. She does not report any palpitation or orthopnea. She does not report any nausea or vomiting or abdominal pain. She has not reported any fevers or chills or sweats. Her rest or view of system is unremarkable.  Medications: I have reviewed the patient's current medications.  Current Outpatient Prescriptions  Medication Sig Dispense Refill  . acetaminophen (TYLENOL) 500 MG tablet Take 1,000 mg by mouth daily as needed (pain).       Marland Kitchen albuterol (PROVENTIL HFA;VENTOLIN HFA) 108 (90 BASE) MCG/ACT inhaler Inhale 2 puffs into the lungs every 4 (four) hours as needed for wheezing.  1 Inhaler  0  . aspirin EC 325 MG EC tablet Take 1  tablet (325 mg total) by mouth daily.  30 tablet  0  . benzonatate (TESSALON PERLES) 100 MG capsule Take 1 capsule (100 mg total) by mouth 3 (three) times daily as needed for cough.  20 capsule  0  . benzonatate (TESSALON) 100 MG capsule Take 100 mg by mouth 3 (three) times daily as needed for cough.      . carvedilol (COREG) 3.125 MG tablet Take 2 tablets (6.25 mg total) by mouth 2 (two) times daily.  60 tablet  3  . cetirizine (ZYRTEC) 10 MG tablet Take 10 mg by mouth daily.      . cholecalciferol (VITAMIN D) 1000 UNITS tablet Take 2,000 Units by mouth daily.      . ferrous fumarate (HEMOCYTE - 106 MG FE) 325 (106 FE) MG TABS tablet Take 1 tablet (106 mg of iron total) by mouth every morning.  30 each  1  . furosemide (LASIX) 20 MG tablet Take 20-40 mg by mouth daily. Alternates 20 & 40mg       . guaiFENesin (MUCINEX) 600 MG 12 hr tablet Take 600 mg by mouth 2 (two) times daily as needed for cough.      Marland Kitchen ibuprofen (ADVIL,MOTRIN) 200 MG tablet Take 800 mg by mouth daily as needed for mild pain.      Marland Kitchen insulin glargine (LANTUS) 100 UNIT/ML injection Inject 0.3 mLs (30 Units total) into the skin at bedtime.  10 mL  11  . insulin regular (NOVOLIN R,HUMULIN R) 100 units/mL injection Inject 20-30 Units into the skin 3 (three) times daily  before meals. SSI      . lisinopril (PRINIVIL,ZESTRIL) 5 MG tablet Take 1 tablet (5 mg total) by mouth 2 (two) times daily.  60 tablet  3  . metFORMIN (GLUCOPHAGE) 500 MG tablet Take 1,000 mg by mouth 2 (two) times daily with a meal.      . Multiple Vitamin (MULTIVITAMIN WITH MINERALS) TABS Take 1 tablet by mouth every morning.       . nitroGLYCERIN (NITROSTAT) 0.4 MG SL tablet Place 1 tablet (0.4 mg total) under the tongue every 5 (five) minutes as needed for chest pain.  90 tablet  3  . potassium chloride SA (K-DUR,KLOR-CON) 10 MEQ tablet Take 1 tablet (10 mEq total) by mouth daily.  30 tablet  3  . simvastatin (ZOCOR) 40 MG tablet Take 40 mg by mouth at bedtime.       Marland Kitchen Specialty Vitamins Products (VITAMINS FOR HAIR PO) Take 1 tablet by mouth every morning.        No current facility-administered medications for this visit.    Allergies:  Allergies  Allergen Reactions  . Amoxicillin-Pot Clavulanate Diarrhea     Physical Exam: Blood pressure 131/56, pulse 103, temperature 98.4 F (36.9 C), temperature source Oral, resp. rate 22, height 5\' 9"  (1.753 m), weight 351 lb 14.4 oz (159.621 kg), last menstrual period 06/17/2012, SpO2 99.00%. ECOG: 1 General appearance: alert awake not in any distress. Head: Normocephalic, without obvious abnormality, atraumatic Neck: no adenopathy.  Lymph nodes: Cervical, supraclavicular, and axillary nodes normal. Heart:regular rate and rhythm, S1, S2 normal, no murmur, click, rub or gallop Lung:chest clear, no wheezing, rales, normal symmetric air entry Abdomin: soft, non-tender, without masses or organomegaly EXT:no erythema, induration, or nodules   Lab Results: Lab Results  Component Value Date   WBC 13.4* 05/18/2013   HGB 12.9 05/18/2013   HCT 39.7 05/18/2013   MCV 73.2* 05/18/2013   PLT 414* 05/18/2013     Chemistry      Component Value Date/Time   NA 142 05/18/2013 1114   NA 141 03/29/2012 0945   K 4.2 05/18/2013 1114   K 4.1 03/29/2012 0945   CL 103 05/18/2013 1114   CL 108* 03/29/2012 0945   CO2 29 05/18/2013 1114   CO2 25 03/29/2012 0945   BUN 19 05/18/2013 1114   BUN 14.1 03/29/2012 0945   CREATININE 1.01 05/18/2013 1114   CREATININE 1.11* 01/21/2013 0550   CREATININE 1.1 03/29/2012 0945      Component Value Date/Time   CALCIUM 9.8 05/18/2013 1114   CALCIUM 8.9 03/29/2012 0945   ALKPHOS 113 05/18/2013 1114   ALKPHOS 90 03/29/2012 0945   AST 13 05/18/2013 1114   AST 23 03/29/2012 0945   ALT 12 05/18/2013 1114   ALT 31 03/29/2012 0945   BILITOT 0.4 05/18/2013 1114   BILITOT 0.23 03/29/2012 0945        Impression and Plan:  48 year old woman with the following issues:   1. Thymoma measuring 10.4 cm status post  surgical resection on 01/12/2013. Her pathological staging was T2 N0. She did have a focally involved margin and radiation was deferred for salvage purposes. The plan is to continue with active surveillance at this time and repeat imaging studies with the next visit in August of 2015. I see no role for systemic therapy at this time.   2. A microcytosis: likely due to mild iron deficiency anemia. She is currently on iron supplements and her hemoglobin is normal at this time.  3. Cough  and congestion: I gave her Tessalon Perles to help with her symptoms.    Wyatt Portela, MD 5/14/201510:56 AM

## 2013-05-25 ENCOUNTER — Ambulatory Visit (HOSPITAL_COMMUNITY)
Admission: RE | Admit: 2013-05-25 | Discharge: 2013-05-25 | Disposition: A | Discharge status: Home / Self Care | Payer: Medicaid Other | Source: Ambulatory Visit | Attending: Cardiovascular Disease | Admitting: Cardiovascular Disease

## 2013-05-25 ENCOUNTER — Encounter (HOSPITAL_COMMUNITY)
Admission: RE | Disposition: A | Discharge status: Home / Self Care | Payer: Self-pay | Source: Ambulatory Visit | Attending: Cardiovascular Disease

## 2013-05-25 DIAGNOSIS — Z01818 Encounter for other preprocedural examination: Secondary | ICD-10-CM

## 2013-05-25 DIAGNOSIS — I420 Dilated cardiomyopathy: Secondary | ICD-10-CM

## 2013-05-25 DIAGNOSIS — I10 Essential (primary) hypertension: Secondary | ICD-10-CM

## 2013-05-25 DIAGNOSIS — Z7982 Long term (current) use of aspirin: Secondary | ICD-10-CM | POA: Insufficient documentation

## 2013-05-25 DIAGNOSIS — Z794 Long term (current) use of insulin: Secondary | ICD-10-CM | POA: Insufficient documentation

## 2013-05-25 DIAGNOSIS — E785 Hyperlipidemia, unspecified: Secondary | ICD-10-CM | POA: Insufficient documentation

## 2013-05-25 DIAGNOSIS — I428 Other cardiomyopathies: Secondary | ICD-10-CM | POA: Insufficient documentation

## 2013-05-25 DIAGNOSIS — R079 Chest pain, unspecified: Secondary | ICD-10-CM

## 2013-05-25 DIAGNOSIS — E119 Type 2 diabetes mellitus without complications: Secondary | ICD-10-CM | POA: Insufficient documentation

## 2013-05-25 HISTORY — PX: LEFT HEART CATHETERIZATION WITH CORONARY ANGIOGRAM: SHX5451

## 2013-05-25 LAB — POCT ACTIVATED CLOTTING TIME: Activated Clotting Time: 171 seconds

## 2013-05-25 LAB — GLUCOSE, CAPILLARY
Glucose-Capillary: 84 mg/dL (ref 70–99)
Glucose-Capillary: 98 mg/dL (ref 70–99)

## 2013-05-25 SURGERY — LEFT HEART CATHETERIZATION WITH CORONARY ANGIOGRAM
Anesthesia: LOCAL

## 2013-05-25 MED ORDER — ASPIRIN 81 MG PO CHEW
81.0000 mg | CHEWABLE_TABLET | ORAL | Status: AC
  Administered 2013-05-25: 81 mg via ORAL
  Filled 2013-05-25: qty 1

## 2013-05-25 MED ORDER — LIDOCAINE HCL (PF) 1 % IJ SOLN
INTRAMUSCULAR | Status: AC
  Filled 2013-05-25: qty 30

## 2013-05-25 MED ORDER — MIDAZOLAM HCL 2 MG/2ML IJ SOLN
INTRAMUSCULAR | Status: AC
  Filled 2013-05-25: qty 2

## 2013-05-25 MED ORDER — SODIUM CHLORIDE 0.9 % IJ SOLN: 3.0000 mL | INTRAMUSCULAR | Status: DC | PRN

## 2013-05-25 MED ORDER — SODIUM CHLORIDE 0.9 % IV SOLN: 1.0000 mL/kg/h | INTRAVENOUS | Status: DC

## 2013-05-25 MED ORDER — SODIUM CHLORIDE 0.9 % IV SOLN
INTRAVENOUS | Status: DC
  Administered 2013-05-25: 07:00:00 via INTRAVENOUS

## 2013-05-25 MED ORDER — HEPARIN SODIUM (PORCINE) 1000 UNIT/ML IJ SOLN
INTRAMUSCULAR | Status: AC
  Filled 2013-05-25: qty 1

## 2013-05-25 MED ORDER — VERAPAMIL HCL 2.5 MG/ML IV SOLN
INTRAVENOUS | Status: AC
  Filled 2013-05-25: qty 2

## 2013-05-25 MED ORDER — HEPARIN (PORCINE) IN NACL 2-0.9 UNIT/ML-% IJ SOLN
INTRAMUSCULAR | Status: AC
  Filled 2013-05-25: qty 1000

## 2013-05-25 MED ORDER — FENTANYL CITRATE 0.05 MG/ML IJ SOLN
INTRAMUSCULAR | Status: AC
  Filled 2013-05-25: qty 2

## 2013-05-25 NOTE — Interval H&P Note (Signed)
Cath Lab Visit (complete for each Cath Lab visit)  Clinical Evaluation Leading to the Procedure:   ACS: no  Non-ACS:    Anginal Classification: CCS III  Anti-ischemic medical therapy: Maximal Therapy (2 or more classes of medications)  Non-Invasive Test Results: High-risk stress test findings: cardiac mortality >3%/year  Prior CABG: No previous CABG      History and Physical Interval Note:  05/25/2013 8:02 AM  Kaitlin [name]  has presented today for surgery, with the diagnosis of cp  The various methods of treatment have been discussed with the patient and family. After consideration of risks, benefits and other options for treatment, the patient has consented to  Procedure(s): LEFT HEART CATHETERIZATION WITH CORONARY ANGIOGRAM (N/A) as a surgical intervention .  The patient's history has been reviewed, patient examined, no change in status, stable for surgery.  I have reviewed the patient's chart and labs.  Questions were answered to the patient's satisfaction.     Troy Sine

## 2013-05-25 NOTE — H&P (View-Only) (Signed)
Patient ID: Kaitlin [name], female   DOB: January 02, 1966, 48 y.o.   MRN: [id number]    05/18/2013 GLADIOLA MADORE   [birth year]  [id number]  Primary Physicia Pcp Not In System Primary Cardiologist: Dr. 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 During her w/u for her thymoma, she was noted to have a right pleural effusion. She was referred to Dr. Elsworth Soho who sent her for an echo. 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 He then referred her to Dr. Percival Spanish.  During his evaluation on 05/01/13, she had endorsed chronic LEE, as well as DOE, orthopnea and PND.  He opted to change her BB from Metoprolol to Coreg and also initiated lisinopril. She was continued on Lasix. He ordered a NST. This was completed on 05/10/13. This was interpreted as a high risk stress nuclear study, demonstrating moderate ischemia involving the mid-distal anteroseptal and distal to basal inferolateral wall. EF was estimated at 29%.  She returns to clinic today to review her test results and also for medication titration. She states that she has been doing "ok". She continues to have DOE and occasional LEE. Her orthopnea and PND have improved. She also endorses SSCP, described as "pressure-like", with exertional activities, most notable when walking up steep hills/inclines. Her symptoms are relieved with rest. She denies any resting angina. No dsypnea at rest. She denies syncope/ near syncope. She reports full medication compliance. She is trying to avoid high sodium foods. She has not been weighing herself daily.     Current Outpatient Prescriptions  Medication Sig Dispense Refill  . acetaminophen (TYLENOL) 500 MG tablet Take 1,000 mg by mouth daily as needed (pain).       Marland Kitchen albuterol (PROVENTIL HFA;VENTOLIN HFA) 108 (90 BASE) MCG/ACT inhaler Inhale 2 puffs into the lungs every 4 (four) hours as needed for wheezing.  1  Inhaler  0  . aspirin EC 325 MG EC tablet Take 1 tablet (325 mg total) by mouth daily.  30 tablet  0  . carvedilol (COREG) 3.125 MG tablet Take 2 tablets (6.25 mg total) by mouth 2 (two) times daily.  60 tablet  3  . cetirizine (ZYRTEC) 10 MG tablet Take 10 mg by mouth daily.      . cholecalciferol (VITAMIN D) 1000 UNITS tablet Take 2,000 Units by mouth daily.      . ferrous fumarate (HEMOCYTE - 106 MG FE) 325 (106 FE) MG TABS tablet Take 1 tablet (106 mg of iron total) by mouth every morning.  30 each  1  . furosemide (LASIX) 20 MG tablet Take 20-40 mg by mouth daily. Alternates 20 & 40mg       . ibuprofen (ADVIL,MOTRIN) 200 MG tablet Take 800 mg by mouth daily as needed for mild pain.      Marland Kitchen insulin glargine (LANTUS) 100 UNIT/ML injection Inject 0.3 mLs (30 Units total) into the skin at bedtime.  10 mL  11  . metFORMIN (GLUCOPHAGE) 500 MG tablet Take 1,000 mg by mouth 2 (two) times daily with a meal.      . Multiple Vitamin (MULTIVITAMIN WITH MINERALS) TABS Take 1 tablet by mouth every morning.       . potassium chloride SA (K-DUR,KLOR-CON) 10 MEQ tablet Take 1 tablet (10 mEq total) by mouth daily.  30 tablet  3  . simvastatin (ZOCOR) 40 MG tablet Take 40 mg by  mouth at bedtime.      Marland Kitchen Specialty Vitamins Products (VITAMINS FOR HAIR PO) Take 1 tablet by mouth every morning.       . benzonatate (TESSALON PERLES) 100 MG capsule Take 1 capsule (100 mg total) by mouth 3 (three) times daily as needed for cough.  20 capsule  0  . benzonatate (TESSALON) 100 MG capsule Take 100 mg by mouth 3 (three) times daily as needed for cough.      Marland Kitchen guaiFENesin (MUCINEX) 600 MG 12 hr tablet Take 600 mg by mouth 2 (two) times daily as needed for cough.      . insulin regular (NOVOLIN R,HUMULIN R) 100 units/mL injection Inject 20-30 Units into the skin 3 (three) times daily before meals. SSI      . lisinopril (PRINIVIL,ZESTRIL) 5 MG tablet Take 1 tablet (5 mg total) by mouth 2 (two) times daily.  60 tablet  3  .  nitroGLYCERIN (NITROSTAT) 0.4 MG SL tablet Place 1 tablet (0.4 mg total) under the tongue every 5 (five) minutes as needed for chest pain.  90 tablet  3   No current facility-administered medications for this visit.    Allergies  Allergen Reactions  . Amoxicillin-Pot Clavulanate Diarrhea    History   Social History  . Marital Status: Married    Spouse Name: N/A    Number of Children: 3  . Years of Education: N/A   Occupational History  . Scientist, research (medical)     [employer]   Social History Main Topics  . Smoking status: Passive Smoke Exposure - Never Smoker  . Smokeless tobacco: Never Used  . Alcohol Use: No  . Drug Use: No  . Sexual Activity: Yes    Birth Control/ Protection: Surgical   Other Topics Concern  . Not on file   Social History Narrative  . No narrative on file     Review of Systems: General: negative for chills, fever, night sweats or weight changes.  Cardiovascular: negative for chest pain, dyspnea on exertion, edema, orthopnea, palpitations, paroxysmal nocturnal dyspnea or shortness of breath Dermatological: negative for rash Respiratory: negative for cough or wheezing Urologic: negative for hematuria Abdominal: negative for nausea, vomiting, diarrhea, bright red blood per rectum, melena, or hematemesis Neurologic: negative for visual changes, syncope, or dizziness All other systems reviewed and are otherwise negative except as noted above.    Blood pressure 110/70, pulse 80, last menstrual period 06/17/2012.  General appearance: alert, cooperative, no distress and morbidly obese Neck: no carotid bruit and no JVD Lungs: clear to auscultation bilaterally Heart: regular rate and rhythm, S1, S2 normal, no murmur, click, rub or gallop Extremities: no LEE Pulses: 2+ and symmetric Skin: warm and dry Neurologic: Grossly normal  EKG not performed today  NST 05/11/13  Overall Impression: High risk stress nuclear study demonstrating moderate ischemia involving  the mid-distal anteroseptal and  distal to basal inferolateral wall.  LV Wall Motion: Severe LV dysfunction with EF 29% and  hypo-akinesis of the the mid -distal anteroseptal wall and apex.  Clinical correlation is recommended. Findings are c/w ischemic  cardiomyopathy.   ASSESSMENT AND PLAN:   Congestive dilated cardiomyopathy Etiology unknown. W/u still pending. She is euvolemic on physical exam. Continue current HF meds: diuretic, BB and ACE. She is on low dose Coreg, 3.125 mg BID and low dose lisinopril, 5 mg daily. Her BP today is 110/70. HR is 80 bpm. She denies dizziness. We will attempt to titrate Coreg to 6.25 mg BID and lisinopril  to 10 mg. Pt was instructed to monitor BP closely. She is to notify our office if she becomes hypotensive or starts to develop symptoms. Will also plan for her to come back for a BP check in 3 days with our office pharmacist to see how she is tolerating the change and to reduce dose if needed. Ideal goal would be to titrate Coreg to 25 mg BID and Lisinopril to 40 mg daily. We also discussed the importance of daily weights and low sodium diet. She was instructed to contact our office for lasix dosing instructions if weight increases > 3 lb beyond dry weight in a 24 hr period.   Abnormal nuclear stress test She notes what sounds to be stable angina with exertional activities. We will plan for diagnostic outpatient LHC for definitive assessment of coronaries and to help determine if cardiomyopathy is ischemic vs nonischemic. I have ordered PRN SL NTG and discussed with her the proper use of NTG and when to call 911.  Hypertension 110/70 in office today. On Coreg and Lisinopril for HF. Plan is to increase HF meds today. Pt instructed to monitor BP at home closely over the weekend. F/U in 3 days for office BP check. She is to reduce both Coreg and lisinopril back down to original dose if she becomes symptomatic before f/u.    PLAN  Titrating HF meds, in the  setting of severely reduced systolic function. Will arrange an outpatient diagnostic LHC, in the setting of newly reduced EF, abnormal stress test and stable angina. PRN NTG ordered. She is scheduled for f/u with Dr. Percival Spanish on 5/20. Will see if we can get LHC scheduled prior to her next office visit.   Isamu Trammel SimmonsPA-C 05/18/2013 1:31 PM

## 2013-05-25 NOTE — CV Procedure (Signed)
Kaitlin [name] is a 48 y.o. female   [id number]  [id number] LOCATION:  FACILITY: [place]  PHYSICIAN: Troy Sine, MD, [facility] 1965/05/16   DATE OF PROCEDURE:  05/25/2013     CARDIAC CATHETERIZATION    HISTORY:    Kaitlin [name] is a morbidly obese, African American female with a history of hypertension, hyperlipidemia, diabetes mellitus, and is status post thymoma resection.  She had developed episodes of chest pain.  A nuclear study suggested moderate ischemia involving the mid distal, anteroseptal, and distal to basal, inferolateral wall. Ejection fraction was 29%.  She now is referred for definitive cardiac catheterization.   PROCEDURE:  Left heart catheterization: Coronary angiography; left ventriculography.  Initial right radial approach, but ultimate switch to the femoral approach for completion of the study to cannulate the left coronary system.  The patient was brought to the second floor  Cardiac cath lab in the postabsorptive state. The patient was premedicated initially with Versed 2 mg and fentanyl 50 mcg, and received additional sedation during the procedure.. A right radial approach was utilized after an Allen's test verified adequate circulation. The right radial artery was punctured via the Seldinger technique, and a 6 [place] Glidesheath Slender was inserted without difficulty.  A radial cocktail consisting of Verapamil, IV nitroglycerin, and lidocaine was administered. 5000 units of heparin was administered. A safety J wire was advanced into the ascending aorta. Diagnostic catheterization was done with 5 [place] JR 4 catheters for angiography of the RCA.  There was significant difficulty in attempting to cannulate the left coronary system.  Due to significant vessel  Tortuosity .  Multiple wires including a versicore, safety J as well as a long extra-support Glidewire were used.  Multiple catheters consisting of a JL3.5, TIG, 6 F Launcher FL 3.5 and 13F Laucher FL3.0  catheters were unsuccessful.  After extremely long, arduous attempt, the radial approach was abandoned.  The right groin was prepped and draped in sterile fashion.  The right femoral artery was punctured anteriorly without difficulty and a 5 French sheath was inserted.  Selective angiography of the left artery system was done with a JL 4.0 diagnostic catheter.  A 5 French pigtail catheter was used for  left ventriculography.  All catheters were removed and the patient.  Hemostasis in the right groin was obtained by direct manual pressure.  He TR band was applied to the right radial site at 09:55 and 12 cc of air.  She left the catheterization laboratory in stable condition A 5 French pigtail catheter was used for left ventriculography. A TR radial band was applied for hemostasis. The patient left the catheterization laboratory in stable condition.   HEMODYNAMICS:   Central Aorta: 132/8-  Left Ventricle: 132/29  ANGIOGRAPHY:   The left main coronary artery was angiographically normal and trifurcated into the LAD, ramus intermediate vessel, and left circumflex coronary artery.   The LAD was angiographically normal and gave rise to 2 major diagonal vessels and several septal perforating arteries. The vessel extended to and wrapped around the LV apex.   The ramus intermediate vessel was a moderate size vessel that was angiographically normal  The left circumflex coronary artery was angiographically normal and gave rise to one major bifurcating obtuse marginal branch.   The RCA was angiographically normal it gave rise to a large PDA and PLA vessel.   Left ventriculography revealed mildly dilated ventricle with moderate global LV dysfunction Ejection fraction is 35 to less than 40%.  IMPRESSION:  Non-ischemic cardiomyopathy with an ejection fraction of 35 to less than 40%.  Normal coronary arteries.  RECOMMENDATION:  Medical management of her nonischemic cardiomyopathy   Troy Sine, MD, [facility] 05/25/2013 10:25 AM

## 2013-05-25 NOTE — Discharge Instructions (Signed)
Angiography, Care After Refer to this sheet in the next few weeks. These instructions provide you with information on caring for yourself after your procedure. Your health care provider may also give you more specific instructions. Your treatment has been planned according to current medical practices, but problems sometimes occur. Call your health care provider if you have any problems or questions after your procedure.  WHAT TO EXPECT AFTER THE PROCEDURE After your procedure, it is typical to have the following sensations:  Minor discomfort or tenderness and a small bump at the catheter insertion site. The bump should usually decrease in size and tenderness within 1 to 2 weeks.  Any bruising will usually fade within 2 to 4 weeks. HOME CARE INSTRUCTIONS   You may need to keep taking blood thinners if they were prescribed for you. Only take over-the-counter or prescription medicines for pain, fever, or discomfort as directed by your health care provider.  Do not apply powder or lotion to the site.  Do not sit in a bathtub, swimming pool, or whirlpool for 5 to 7 days.  You may shower 24 hours after the procedure. Remove the bandage (dressing) and gently wash the site with plain soap and water. Gently pat the site dry.  Inspect the site at least twice daily.  Limit your activity for the first 48 hours. Do not bend, squat, or lift anything over 20 lb (9 kg) or as directed by your health care provider.  Do not drive home if you are discharged the day of the procedure. Have someone else drive you. Follow instructions about when you can drive or return to work. SEEK MEDICAL CARE IF:  You get lightheaded when standing up.  You have drainage (other than a small amount of blood on the dressing).  You have chills.  You have a fever.  You have redness, warmth, swelling, or pain at the insertion site. SEEK IMMEDIATE MEDICAL CARE IF:   You develop chest pain or shortness of breath, feel faint,  or pass out.  You have bleeding, swelling larger than a walnut, or drainage from the catheter insertion site.  You develop pain, discoloration, coldness, or severe bruising in the leg or arm that held the catheter.  You have heavy bleeding from the site. If this happens, hold pressure on the site and call 911. MAKE SURE YOU:  Understand these instructions.  Will watch your condition.  Will get help right away if you are not doing well or get worse. Document Released: 07/16/2004 Document Revised: 08/30/2012 Document Reviewed: 05/22/2012 [facility] Patient Information 2014 [place].  Radial Site Care Refer to this sheet in the next few weeks. These instructions provide you with information on caring for yourself after your procedure. Your caregiver may also give you more specific instructions. Your treatment has been planned according to current medical practices, but problems sometimes occur. Call your caregiver if you have any problems or questions after your procedure. HOME CARE INSTRUCTIONS  You may shower the day after the procedure.Remove the bandage (dressing) and gently wash the site with plain soap and water.Gently pat the site dry.  Do not apply powder or lotion to the site.  Do not submerge the affected site in water for 3 to 5 days.  Inspect the site at least twice daily.  Do not flex or bend the affected arm for 24 hours.  No lifting over 5 pounds (2.3 kg) for 5 days after your procedure.  Do not drive home if you are discharged  the same day of the procedure. Have someone else drive you.  You may drive 24 hours after the procedure unless otherwise instructed by your caregiver.  Do not operate machinery or power tools for 24 hours.  A responsible adult should be with you for the first 24 hours after you arrive home. What to expect:  Any bruising will usually fade within 1 to 2 weeks.  Blood that collects in the tissue (hematoma) may be painful to the  touch. It should usually decrease in size and tenderness within 1 to 2 weeks. SEEK IMMEDIATE MEDICAL CARE IF:  You have unusual pain at the radial site.  You have redness, warmth, swelling, or pain at the radial site.  You have drainage (other than a small amount of blood on the dressing).  You have chills.  You have a fever or persistent symptoms for more than 72 hours.  You have a fever and your symptoms suddenly get worse.  Your arm becomes pale, cool, tingly, or numb.  You have heavy bleeding from the site. Hold pressure on the site. Document Released: 01/30/2010 Document Revised: 03/22/2011 Document Reviewed: 01/30/2010 [facility] Patient Information 2014 [place], [place].

## 2013-05-26 ENCOUNTER — Telehealth: Payer: Self-pay | Admitting: Oncology

## 2013-05-26 NOTE — Telephone Encounter (Signed)
lvm for pt regarding to Aug appt...advised to pick up barium...mailed pt appt sched/avs and letter

## 2013-05-30 ENCOUNTER — Ambulatory Visit (INDEPENDENT_AMBULATORY_CARE_PROVIDER_SITE_OTHER): Payer: Medicaid Other | Admitting: Cardiology

## 2013-05-30 ENCOUNTER — Encounter: Payer: Self-pay | Admitting: Cardiology

## 2013-05-30 VITALS — BP 124/80 | HR 92 | Ht 69.0 in | Wt 357.7 lb

## 2013-05-30 DIAGNOSIS — I420 Dilated cardiomyopathy: Secondary | ICD-10-CM

## 2013-05-30 DIAGNOSIS — I5022 Chronic systolic (congestive) heart failure: Secondary | ICD-10-CM

## 2013-05-30 DIAGNOSIS — Z79899 Other long term (current) drug therapy: Secondary | ICD-10-CM

## 2013-05-30 DIAGNOSIS — I10 Essential (primary) hypertension: Secondary | ICD-10-CM

## 2013-05-30 DIAGNOSIS — I428 Other cardiomyopathies: Secondary | ICD-10-CM

## 2013-05-30 LAB — BASIC METABOLIC PANEL
BUN: 16 mg/dL (ref 6–23)
CHLORIDE: 108 meq/L (ref 96–112)
CO2: 25 meq/L (ref 19–32)
Calcium: 9.5 mg/dL (ref 8.4–10.5)
Creat: 0.85 mg/dL (ref 0.50–1.10)
Glucose, Bld: 54 mg/dL — ABNORMAL LOW (ref 70–99)
Potassium: 4.4 mEq/L (ref 3.5–5.3)
Sodium: 144 mEq/L (ref 135–145)

## 2013-05-30 MED ORDER — CARVEDILOL 12.5 MG PO TABS: 12.5000 mg | ORAL_TABLET | Freq: Two times a day (BID) | ORAL | Status: DC

## 2013-05-30 MED ORDER — FUROSEMIDE 40 MG PO TABS: ORAL_TABLET | ORAL | Status: DC

## 2013-05-30 MED ORDER — POTASSIUM CHLORIDE CRYS ER 20 MEQ PO TBCR: 20.0000 meq | EXTENDED_RELEASE_TABLET | Freq: Every day | ORAL | Status: DC

## 2013-05-30 NOTE — Progress Notes (Signed)
HPI The patient has had a complicated history recently.  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  She was considered for radiation but it was decided that she did not need this.  During her evaluations she has been noted to have right pleural effusion.  She was referred to Dr. Elsworth Soho who sent her for an echo.  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  Perfusion imaging suggested a defect in the anteroseptal wall. However, cardiac catheterization demonstrated normal coronaries.  She presents for followup and is having some atypical right-sided chest discomfort. She's had this for some time. It comes and goes sporadically. She's also had some positional discomfort lying on her left side.  She reports some difficulty in walking and she wonders if this could be related to swelling that she has.   Allergies  Allergen Reactions  . Amoxicillin-Pot Clavulanate Diarrhea    Current Outpatient Prescriptions  Medication Sig Dispense Refill  . acetaminophen (TYLENOL) 500 MG tablet Take 1,000 mg by mouth daily as needed (pain).       Marland Kitchen albuterol (PROVENTIL HFA;VENTOLIN HFA) 108 (90 BASE) MCG/ACT inhaler Inhale 2 puffs into the lungs every 4 (four) hours as needed for wheezing.  1 Inhaler  0  . aspirin EC 325 MG EC tablet Take 1 tablet (325 mg total) by mouth daily.  30 tablet  0  . benzonatate (TESSALON PERLES) 100 MG capsule Take 1 capsule (100 mg total) by mouth 3 (three) times daily as needed for cough.  20 capsule  0  . benzonatate (TESSALON) 100 MG capsule Take 100 mg by mouth 3 (three) times daily as needed for cough.      . carvedilol (COREG) 3.125 MG tablet Take 2 tablets (6.25 mg total) by mouth 2 (two) times daily.  60 tablet  3  . cetirizine (ZYRTEC) 10 MG tablet Take 10 mg by mouth daily.      . cholecalciferol (VITAMIN D) 1000 UNITS tablet Take 2,000 Units by mouth daily.      .  ferrous fumarate (HEMOCYTE - 106 MG FE) 325 (106 FE) MG TABS tablet Take 1 tablet (106 mg of iron total) by mouth every morning.  30 each  1  . furosemide (LASIX) 20 MG tablet Take 20-40 mg by mouth daily. Alternates 20 & 40mg       . guaiFENesin (MUCINEX) 600 MG 12 hr tablet Take 600 mg by mouth 2 (two) times daily as needed for cough.      Marland Kitchen ibuprofen (ADVIL,MOTRIN) 200 MG tablet Take 800 mg by mouth daily as needed for mild pain.      Marland Kitchen insulin glargine (LANTUS) 100 UNIT/ML injection Inject 0.3 mLs (30 Units total) into the skin at bedtime.  10 mL  11  . insulin regular (NOVOLIN R,HUMULIN R) 100 units/mL injection Inject 20-30 Units into the skin 3 (three) times daily before meals. SSI      . lisinopril (PRINIVIL,ZESTRIL) 5 MG tablet Take 1 tablet (5 mg total) by mouth 2 (two) times daily.  60 tablet  3  . metFORMIN (GLUCOPHAGE) 500 MG tablet Take 1,000 mg by mouth 2 (two) times daily with a meal.      . Multiple Vitamin (MULTIVITAMIN WITH MINERALS) TABS Take 1 tablet by mouth every morning.       . nitroGLYCERIN (NITROSTAT) 0.4 MG SL  tablet Place 1 tablet (0.4 mg total) under the tongue every 5 (five) minutes as needed for chest pain.  90 tablet  3  . potassium chloride SA (K-DUR,KLOR-CON) 10 MEQ tablet Take 1 tablet (10 mEq total) by mouth daily.  30 tablet  3  . simvastatin (ZOCOR) 40 MG tablet Take 40 mg by mouth at bedtime.      Marland Kitchen Specialty Vitamins Products (VITAMINS FOR HAIR PO) Take 1 tablet by mouth every morning.        No current facility-administered medications for this visit.    Past Medical History  Diagnosis Date  . Diabetes mellitus   . High cholesterol   . Leukocytosis   . Anemia   . Obesity   . Sickle cell trait   . Hypertension     takes medicine to protect kidneys, does not have HTN  . Bronchitis   . Pneumonia   . Thymoma     Past Surgical History  Procedure Laterality Date  . Tonsillectomy    . Tubal ligation    . Mediastinotomy chamberlain mcneil Right  11/06/2012    Procedure: MEDIASTINOTOMY CHAMBERLAIN MCNEILPROCEDURE;  Surgeon: Gaye Pollack, MD;  Location: [facility] OR;  Service: Thoracic;  Laterality: Right;  . Lymph node biopsy Right 11/30/2012    Procedure: RIGHT TONSIL BIOPSY WITH FRESH FROZEN ANALYSIS;  Surgeon: Jodi Marble, MD;  Location: [place];  Service: ENT;  Laterality: Right;  . Mediasternotomy N/A 01/12/2013    Procedure: MEDIAN STERNOTOMY;  Surgeon: Gaye Pollack, MD;  Location: [place];  Service: Thoracic;  Laterality: N/A;  . Resection of a thymoma N/A 01/12/2013    Procedure: RESECTION OF A THYMOMA;  Surgeon: Gaye Pollack, MD;  Location: [place];  Service: Thoracic;  Laterality: N/A;     ROS:  As stated in the HPI and negative for all other systems.  PHYSICAL EXAM BP 124/80  Pulse 92  Ht 5\' 9"  (1.753 m)  Wt 357 lb 11.2 oz (162.252 kg)  BMI 52.80 kg/m2  LMP 06/17/2012 GENERAL:  Well appearing HEENT:  Pupils equal round and reactive, fundi not visualized, oral mucosa unremarkable NECK:  No jugular venous distention, waveform within normal limits, carotid upstroke brisk and symmetric, no bruits, no thyromegaly LYMPHATICS:  No cervical, inguinal adenopathy LUNGS:  Clear to auscultation bilaterally BACK:  No CVA tenderness CHEST:  Unremarkable HEART:  PMI not displaced or sustained,S1 and S2 within normal limits, no S3, no S4, no clicks, no rubs, no murmurs ABD:  Flat, positive bowel sounds normal in frequency in pitch, no bruits, no rebound, no guarding, no midline pulsatile mass, no hepatomegaly, no splenomegaly EXT:  2 plus pulses throughout, mild edema, no cyanosis no clubbing SKIN:  No rashes no nodules NEURO:  Cranial nerves II through XII grossly intact, motor grossly intact throughout PSYCH:  Cognitively intact, oriented to person place and time   EKG:  NSR, rate 102, leftward axis, poor anterior R wave progression, premature ectopic complex, QTC prolonged.  05/30/2013  ASSESSMENT AND PLAN  DILATED CARDIOMYOPATHY:    We will begin to manage this medically. The first thing we'll be more aggressive diuresis. I did increase her Lasix to 40 mg twice a day and increase her potassium. She will get a basic metabolic profile today. We will bring her back in one week for followup of her volume status as she will most likely need increased diuresis. Will need to see her frequently with frequent lab draws. We did discuss also fluid  restriction.  I will increase the Coreg to 12.5 bid.  We can start an ACE inhibitor at the next appt.   PROLONGED QT:  She has no symptoms related to this.  No change in therapy is indicated.

## 2013-05-30 NOTE — Patient Instructions (Addendum)
Please increase your Furosemide to 40 mg one twice a day.  Take doses 6 hours apart. Increase your Potassium to 20 MEQ a day. Increase your carvedilol to 12.5 mg twice a day. Continue all other medications as listed.  Have blood work today and in 1 week. (BMP) on the 1st floor - [place].  Follow up with PA/NP in approximately one week. (either here or [facility] [facility]).  3 to 4 Gram Sodium Diet, No Added Salt (NAS) A 3 to 4 gram sodium diet restricts the amount of sodium in the diet to no more than 3 to 4 g or 3000 to 4000 mg daily. Limiting the amount of sodium is often used to help lower blood pressure. It is important if you have heart, liver, or kidney problems. Many foods contain sodium for flavor and sometimes as a preservative. When the amount of sodium in a diet needs to be low, it is important to know what to look for when choosing foods and drinks. The following includes some information and guidelines to help make it easier for you to adapt to a low sodium diet. QUICK TIPS  Do not add salt to food.  Avoid convenience items and fast food.  Choose unsalted snack foods.  Buy lower sodium products, often labeled as "lower sodium" or "no salt added."  Check food labels to learn how much sodium is in 1 serving.  When eating at a restaurant, ask that your food be prepared with less salt or none, if possible. READING FOOD LABELS FOR SODIUM INFORMATION The nutrition facts label is a good place to find how much sodium is in foods. Look for products with no more than 500 to 600 mg of sodium per meal and no more than 150 mg per serving. Remember that 3 to 4 g = 3000 to 4000 mg. The food label may also list foods as:  Sodium-free: Less than 5 mg in a serving.  Very low sodium: 35 mg or less in a serving.  Low-sodium: 140 mg or less in a serving.  Light in sodium: 50% less sodium in a serving. For example, if a food that usually has 300 mg of sodium is changed to become light in  sodium, it will have 150 mg of sodium.  Reduced sodium: 25% less sodium in a serving. For example, if a food that usually has 400 mg of sodium is changed to reduced sodium, it will have 300 mg of sodium. CHOOSING FOODS Grains  Avoid: Salted crackers and snack items. Bread stuffing and biscuit mixes. Seasoned rice or pasta mixes.  Choose: Unsalted snack items. English muffins, breads, and rolls. Homemade pancakes and waffles. Most cereals. Pasta. Meats  Avoid: Salted, canned, smoked, spiced, pickled meats, including fish and poultry. Bacon, ham, sausage, cold cuts, hot dogs, anchovies.  Choose: Low-sodium canned tuna and salmon. Fresh or frozen meat, poultry, and fish. Dairy  Avoid: Processed cheese and spreads. Cottage cheese. Buttermilk and condensed milk. Regular cheese.  Choose: Milk. Low-sodium cottage cheese. Yogurt. Sour cream. Low-sodium cheese. Fruits and Vegetables  Avoid: Regular canned vegetables. Regular canned tomato sauce and paste. Frozen vegetables in sauces. Olives. Angie Fava. Relishes. Sauerkraut.  Choose: Low-sodium canned vegetables. Low-sodium tomato sauce and paste. Frozen or fresh vegetables. Fresh and frozen fruit. Condiments  Avoid: Canned and packaged gravies. Worcestershire sauce. Tartar sauce. Barbecue sauce. Soy sauce. Steak sauce. Ketchup. Onion, garlic, and table salt. Meat flavorings and tenderizers.  Choose: Fresh and dried herbs and spices. Low-sodium varieties of mustard  and ketchup. Lemon juice. Tabasco sauce. Horseradish. SAMPLE 3 TO 4 GRAM SODIUM MEAL PLAN  Breakfast / Sodium (mg)  1 cup low-fat milk / 081 mg  2 slices whole-wheat toast / 270 mg  1 tbs heart-healthy margarine / 153 mg  1 hard-boiled egg / 139 mg Lunch / Sodium (mg)  1 cup raw carrots / 76 mg   cup hummus / 298 mg  1 cup low-fat milk / 143 mg   cup red grapes / 2 mg  1 cup low-sodium chicken and rice soup / 480 mg  10 low-sodium saltine crackers / 191 mg Dinner  / Sodium (mg)  1 cup whole-wheat pasta / 2 mg  1 cup tomato sauce / 1178 mg  3 oz lean ground beef / 57 mg  1 small side salad (1 cup raw spinach leaves,  cup cucumber,  cup yellow bell pepper) / 25 mg  1 tsp ranch dressing / 144 mg Snack / Sodium (mg)  1 slice cheddar cheese / 258 mg  1 medium apple / 1 mg Nutrient Analysis  Calories: 2005  Protein: 85 g  Carbohydrate: 245 g  Fat: 78 g  Sodium: 3560 mg Document Released: 12/28/2004 Document Revised: 03/22/2011 Document Reviewed: 03/31/2009 [employer] Patient Information 2014 [place], [place].  Cardiomyopathy Cardiomyopathy means a disease of the heart muscle. The heart muscle becomes enlarged or stiff. The heart is not able to pump enough blood or deliver enough oxygen to the body. This leads to heart failure and is the number one reason for heart transplants.  TYPES OF CARDIOMYOPATHY INCLUDE: DILATED  The most common type. The heart muscle is stretched out and weak so there is less blood pumped out.   Some causes:  Disease of the arteries of the heart (ischemia).  Heart attack with muscle scar.  Leaky or damaged valves.  After a viral illness.  Smoking.  High cholesterol.  Diabetes or overactive thyroid.  Alcohol or drug abuse.  High blood pressure.  May be reversible. HYPERTROPHIC The heart muscle grows bigger so there is less room for blood in the ventricle, and not enough blood is pumped out.   Causes include:  Mitral valve leaks.  Inherited tendency (from your family).  No explanation (idiopathic).  May be a cause of sudden death in young athletes with no symptoms. RESTRICTIVE The heart muscle becomes stiff, but not always larger. The heart has to work harder and will get weaker. Abnormal heart beats or rhythm (arrhythmia) are common.  Some causes:  Diseases in other parts of the body which may produce abnormal deposits in the heart muscle.  Probably not inherited.  A result of  radiation treatment for cancer. SYMPTOMS OF ALL TYPES:  Less able to exercise or tolerate physical activity.  Palpitations.  Irregular heart beat, heart arrhythmias.  Shortness of breath, even at rest.  Chest pain.  Lightheadedness or fainting. TREATMENT  Life-style changes including reducing salt, lowering cholesterol, stop smoking.  Manage contributing causes with medications.  Medicines to help reduce the fluids in the body.  An implanted cardioverter defibrillator (ICD) to improve heart function and correct arrhythmias.  Medications to relax the blood vessels and make it easier for the heart to pump.  Drugs that help regulate heart beat and improve heart relaxation, reducing the work of the heart.  Myomectomy for patients with hypertrophic cardiomyopathy and severe problems. This is a surgical procedure that removes a portion of the thickened muscle wall in order to improve heart output and provide  symptom relief.  A heart transplant is an option in carefully applied circumstances. SEEK IMMEDIATE MEDICAL CARE IF:   You have severe chest pain, especially if the pain is crushing or pressure-like and spreads to the arms, back, neck, or jaw, or if you have sweating, feeling sick to your stomach (nausea), or shortness of breath. THIS IS AN EMERGENCY. Do not wait to see if the pain will go away. Get medical help at once. Call your local emergency services (911 in [place] DO NOT drive yourself to the hospital.  You develop severe shortness of breath.  You begin to cough up bloody sputum.  You are unable to sleep because you cannot breathe.  You gain weight due to fluid retention.  You develop painful swelling in your calf or leg.  You feel your heart racing and it does not go away or happens when you are resting. Document Released: 03/12/2004 Document Revised: 03/22/2011 Document Reviewed: 08/16/2007 [facility] Patient Information 2014 [place].

## 2013-06-08 ENCOUNTER — Encounter: Payer: Self-pay | Admitting: Cardiology

## 2013-06-08 ENCOUNTER — Ambulatory Visit (INDEPENDENT_AMBULATORY_CARE_PROVIDER_SITE_OTHER): Payer: Medicaid Other | Admitting: Cardiology

## 2013-06-08 VITALS — BP 128/66 | HR 98 | Ht 69.0 in | Wt 346.4 lb

## 2013-06-08 DIAGNOSIS — I5022 Chronic systolic (congestive) heart failure: Secondary | ICD-10-CM

## 2013-06-08 DIAGNOSIS — Z79899 Other long term (current) drug therapy: Secondary | ICD-10-CM

## 2013-06-08 LAB — BASIC METABOLIC PANEL
BUN: 23 mg/dL (ref 6–23)
CO2: 29 mEq/L (ref 19–32)
Calcium: 9.6 mg/dL (ref 8.4–10.5)
Chloride: 104 mEq/L (ref 96–112)
Creatinine, Ser: 1.1 mg/dL (ref 0.4–1.2)
GFR: 66.69 mL/min (ref >60.00)
GLUCOSE: 264 mg/dL — AB (ref 70–99)
POTASSIUM: 4 meq/L (ref 3.5–5.1)
Sodium: 141 mEq/L (ref 135–145)

## 2013-06-08 MED ORDER — LISINOPRIL 10 MG PO TABS: 10.0000 mg | ORAL_TABLET | Freq: Two times a day (BID) | ORAL | Status: DC

## 2013-06-08 NOTE — Progress Notes (Signed)
HPI The patient has had a complicated history recently.  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  She was considered for radiation but it was decided that she did not need this.  During her evaluations she has been noted to have right pleural effusion.  She was referred to Dr. Elsworth Soho who sent her for an echo.  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  Perfusion imaging suggested a defect in the anteroseptal wall. However, cardiac catheterization demonstrated normal coronaries.   At the last visit it increased her beta blocker and gave her some extra diuretic. She thinks it might have been some improvement in her swelling and maybe her dyspnea. She is having some chest discomfort. This comes on sporadically. Some of her pain is positional. She does have some discomfort associated with palpation of her chest incision. However, she also gets some sporadic right-sided upper chest discomfort for which she takes a nitroglycerin. This may be associated with shortness of breath but not necessarily. She is trying to walk for exercise..   Allergies  Allergen Reactions  . Amoxicillin-Pot Clavulanate Diarrhea    Current Outpatient Prescriptions  Medication Sig Dispense Refill  . acetaminophen (TYLENOL) 500 MG tablet Take 1,000 mg by mouth daily as needed (pain).       Marland Kitchen albuterol (PROVENTIL HFA;VENTOLIN HFA) 108 (90 BASE) MCG/ACT inhaler Inhale 2 puffs into the lungs every 4 (four) hours as needed for wheezing.  1 Inhaler  0  . aspirin EC 325 MG EC tablet Take 1 tablet (325 mg total) by mouth daily.  30 tablet  0  . benzonatate (TESSALON PERLES) 100 MG capsule Take 1 capsule (100 mg total) by mouth 3 (three) times daily as needed for cough.  20 capsule  0  . carvedilol (COREG) 12.5 MG tablet Take 1 tablet (12.5 mg total) by mouth 2 (two) times daily.  60 tablet  11  . cetirizine (ZYRTEC) 10 MG  tablet Take 10 mg by mouth daily.      . cholecalciferol (VITAMIN D) 1000 UNITS tablet Take 2,000 Units by mouth daily.      . ferrous fumarate (HEMOCYTE - 106 MG FE) 325 (106 FE) MG TABS tablet Take 1 tablet (106 mg of iron total) by mouth every morning.  30 each  1  . furosemide (LASIX) 40 MG tablet Take twice a day 6 hours apart  60 tablet  11  . guaiFENesin (MUCINEX) 600 MG 12 hr tablet Take 600 mg by mouth 2 (two) times daily as needed for cough.      Marland Kitchen ibuprofen (ADVIL,MOTRIN) 200 MG tablet Take 800 mg by mouth daily as needed for mild pain.      Marland Kitchen insulin glargine (LANTUS) 100 UNIT/ML injection Inject 0.3 mLs (30 Units total) into the skin at bedtime.  10 mL  11  . insulin regular (NOVOLIN R,HUMULIN R) 100 units/mL injection Inject 20-30 Units into the skin 3 (three) times daily before meals. SSI      . lisinopril (PRINIVIL,ZESTRIL) 5 MG tablet Take 1 tablet (5 mg total) by mouth 2 (two) times daily.  60 tablet  3  . metFORMIN (GLUCOPHAGE) 500 MG tablet Take 1,000 mg by mouth 2 (two) times daily with a meal.      . Multiple Vitamin (MULTIVITAMIN WITH MINERALS) TABS Take 1 tablet by mouth every  morning.       . nitroGLYCERIN (NITROSTAT) 0.4 MG SL tablet Place 1 tablet (0.4 mg total) under the tongue every 5 (five) minutes as needed for chest pain.  90 tablet  3  . potassium chloride (K-DUR,KLOR-CON) 20 MEQ tablet Take 1 tablet (20 mEq total) by mouth daily.  30 tablet  3  . simvastatin (ZOCOR) 40 MG tablet Take 40 mg by mouth at bedtime.       No current facility-administered medications for this visit.    Past Medical History  Diagnosis Date  . Diabetes mellitus   . High cholesterol   . Leukocytosis   . Anemia   . Obesity   . Sickle cell trait   . Hypertension     takes medicine to protect kidneys, does not have HTN  . Bronchitis   . Pneumonia   . Thymoma     Past Surgical History  Procedure Laterality Date  . Tonsillectomy    . Tubal ligation    . Mediastinotomy  chamberlain mcneil Right 11/06/2012    Procedure: MEDIASTINOTOMY CHAMBERLAIN MCNEILPROCEDURE;  Surgeon: Gaye Pollack, MD;  Location: [facility] OR;  Service: Thoracic;  Laterality: Right;  . Lymph node biopsy Right 11/30/2012    Procedure: RIGHT TONSIL BIOPSY WITH FRESH FROZEN ANALYSIS;  Surgeon: Jodi Marble, MD;  Location: [facility];  Service: ENT;  Laterality: Right;  . Mediasternotomy N/A 01/12/2013    Procedure: MEDIAN STERNOTOMY;  Surgeon: Gaye Pollack, MD;  Location: [facility];  Service: Thoracic;  Laterality: N/A;  . Resection of a thymoma N/A 01/12/2013    Procedure: RESECTION OF A THYMOMA;  Surgeon: Gaye Pollack, MD;  Location: [facility];  Service: Thoracic;  Laterality: N/A;     ROS:  As stated in the HPI and negative for all other systems.  PHYSICAL EXAM BP 128/66  Pulse 98  Ht 5\' 9"  (1.753 m)  Wt 349 lb (158.305 kg)  BMI 51.51 kg/m2  LMP 06/17/2012 GENERAL:  Well appearing HEENT:  Pupils equal round and reactive, fundi not visualized, oral mucosa unremarkable NECK:  No jugular venous distention, waveform within normal limits, carotid upstroke brisk and symmetric, no bruits, no thyromegaly LUNGS:  Clear to auscultation bilaterally CHEST:  Unremarkable HEART:  PMI not displaced or sustained,S1 and S2 within normal limits, no S3, no S4, no clicks, no rubs, no murmurs ABD:  Flat, positive bowel sounds normal in frequency in pitch, no bruits, no rebound, no guarding, no midline pulsatile mass, no hepatomegaly, no splenomegaly EXT:  2 plus pulses throughout, trace edema, no cyanosis no clubbing   ASSESSMENT AND PLAN  DILATED CARDIOMYOPATHY:   I will increase the Lisinopril to 10 mg bid.  I will get a BMET today and again in 1 week.  For now she will continue the diuretics as listed.    PROLONGED QT:  She has no symptoms related to this.  No change in therapy is indicated.  This was noted on the previous EKG.

## 2013-06-08 NOTE — Patient Instructions (Signed)
Please increase your Lisinopril to 10 mg twice a day. Continue all other medications as listed.  Please have blood work today and again in 1 week (BMP)  Follow up with NA/PA in 2 weeks. (can be at [employer])

## 2013-06-15 ENCOUNTER — Other Ambulatory Visit (INDEPENDENT_AMBULATORY_CARE_PROVIDER_SITE_OTHER): Payer: Medicaid Other

## 2013-06-15 DIAGNOSIS — Z79899 Other long term (current) drug therapy: Secondary | ICD-10-CM

## 2013-06-15 DIAGNOSIS — I5022 Chronic systolic (congestive) heart failure: Secondary | ICD-10-CM

## 2013-06-15 LAB — BASIC METABOLIC PANEL
BUN: 19 mg/dL (ref 6–23)
CO2: 29 mEq/L (ref 19–32)
CREATININE: 1 mg/dL (ref 0.4–1.2)
Calcium: 9.9 mg/dL (ref 8.4–10.5)
Chloride: 105 mEq/L (ref 96–112)
GFR: 76.79 mL/min (ref >60.00)
GLUCOSE: 136 mg/dL — AB (ref 70–99)
Potassium: 4.3 mEq/L (ref 3.5–5.1)
Sodium: 142 mEq/L (ref 135–145)

## 2013-06-18 DIAGNOSIS — G5710 Meralgia paresthetica, unspecified lower limb: Secondary | ICD-10-CM | POA: Insufficient documentation

## 2013-06-19 ENCOUNTER — Ambulatory Visit (INDEPENDENT_AMBULATORY_CARE_PROVIDER_SITE_OTHER): Payer: Medicaid Other | Admitting: Cardiology

## 2013-06-19 ENCOUNTER — Encounter: Payer: Self-pay | Admitting: Cardiology

## 2013-06-19 VITALS — BP 130/70 | HR 72 | Ht 69.0 in | Wt 349.4 lb

## 2013-06-19 DIAGNOSIS — I428 Other cardiomyopathies: Secondary | ICD-10-CM

## 2013-06-19 NOTE — Patient Instructions (Signed)
1. Increase your Coreg to 25 mg two times a day  2. Don't take your Coreg unless your pulse is at least 60 beats per min  3.Your physician recommends that you schedule a follow-up appointment in:  2 weeks with Dr. Percival Spanish

## 2013-06-19 NOTE — Progress Notes (Signed)
Patient ID: Kaitlin [name], female   DOB: 1965/08/06, 48 y.o.   MRN: [id number]    06/19/2013 Kaitlin [name]   1965-11-01  [id number]  Primary Physicia Hermina Staggers, MD Primary Cardiologist: Dr. Percival Spanish  HPI:  The patient is a morbidly obese, AAF, with a history of HTN, HLD, DM, and thymoma, s/p resection by Dr. Cyndia Bent on 01/12/13. During her w/u for her thymoma, she was noted to have a right pleural effusion. She was referred to Dr. Elsworth Soho who sent her for an echo. 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 He then referred her to Dr. Percival Spanish. During his evaluation on 05/01/13, she had endorsed chronic LEE, as well as DOE, orthopnea and PND. He opted to change her BB from Metoprolol to Coreg and also initiated lisinopril. She was continued on Lasix. He ordered a NST. This was completed on 05/10/13. This was interpreted as a high risk stress nuclear study, demonstrating moderate ischemia involving the mid-distal anteroseptal and distal to basal inferolateral wall. EF was estimated at 29%. Subsequently, she underwent a diagnostic left heart catheterization which was performed by Dr. Claiborne Billings. This demonstrated angiographically normal coronary arteries, making the diagnosis of nonischemic cardiomyopathy. Continued medical therapy for heart failure was recommended. She was last seen by Dr. Percival Spanish on 06/08/2013. At that time, he increased her lisinopril to 10 mg twice a day. She was continued on 12.5 mg of Coreg twice a day as well as 40 mg of Lasix twice a day.  She presents back to clinic today for followup for medication titration. She states that he she has been doing fairly well. She reports she has been fully compliant with her medications. She has been weighing herself every day. She denies weight gain, lower treatment he edema, shortness of breath, orthopnea/PND. Her only complaint is some mild fatigue, however she denies chest discomfort, palpitations, dizziness,  syncope/near-syncope.    Current Outpatient Prescriptions  Medication Sig Dispense Refill  . acetaminophen (TYLENOL) 500 MG tablet Take 1,000 mg by mouth daily as needed (pain).       Marland Kitchen albuterol (PROVENTIL HFA;VENTOLIN HFA) 108 (90 BASE) MCG/ACT inhaler Inhale 2 puffs into the lungs every 4 (four) hours as needed for wheezing.  1 Inhaler  0  . aspirin EC 325 MG EC tablet Take 1 tablet (325 mg total) by mouth daily.  30 tablet  0  . benzonatate (TESSALON PERLES) 100 MG capsule Take 1 capsule (100 mg total) by mouth 3 (three) times daily as needed for cough.  20 capsule  0  . carvedilol (COREG) 12.5 MG tablet Take 25 mg by mouth 2 (two) times daily.      . cetirizine (ZYRTEC) 10 MG tablet Take 10 mg by mouth daily.      . cholecalciferol (VITAMIN D) 1000 UNITS tablet Take 2,000 Units by mouth daily.      . ferrous fumarate (HEMOCYTE - 106 MG FE) 325 (106 FE) MG TABS tablet Take 1 tablet (106 mg of iron total) by mouth every morning.  30 each  1  . furosemide (LASIX) 40 MG tablet Take twice a day 6 hours apart  60 tablet  11  . gabapentin (NEURONTIN) 300 MG capsule Take 300 mg by mouth daily.      Marland Kitchen guaiFENesin (MUCINEX) 600 MG 12 hr tablet Take 600 mg by mouth 2 (two) times daily as needed for cough.      Marland Kitchen ibuprofen (ADVIL,MOTRIN) 200 MG tablet Take 800 mg by mouth  daily as needed for mild pain.      Marland Kitchen insulin glargine (LANTUS) 100 UNIT/ML injection Inject 0.3 mLs (30 Units total) into the skin at bedtime.  10 mL  11  . insulin regular (NOVOLIN R,HUMULIN R) 100 units/mL injection Inject 20-30 Units into the skin 3 (three) times daily before meals. SSI      . lisinopril (PRINIVIL,ZESTRIL) 10 MG tablet Take 1 tablet (10 mg total) by mouth 2 (two) times daily.  60 tablet  3  . metFORMIN (GLUCOPHAGE) 500 MG tablet Take 1,000 mg by mouth 2 (two) times daily with a meal.      . Multiple Vitamin (MULTIVITAMIN WITH MINERALS) TABS Take 1 tablet by mouth every morning.       . nitroGLYCERIN (NITROSTAT)  0.4 MG SL tablet Place 1 tablet (0.4 mg total) under the tongue every 5 (five) minutes as needed for chest pain.  90 tablet  3  . potassium chloride (K-DUR,KLOR-CON) 20 MEQ tablet Take 1 tablet (20 mEq total) by mouth daily.  30 tablet  3  . simvastatin (ZOCOR) 40 MG tablet Take 40 mg by mouth at bedtime.       No current facility-administered medications for this visit.    Allergies  Allergen Reactions  . Amoxicillin-Pot Clavulanate Diarrhea    History   Social History  . Marital Status: Married    Spouse Name: N/A    Number of Children: 3  . Years of Education: N/A   Occupational History  . Scientist, research (medical)     [employer]   Social History Main Topics  . Smoking status: Passive Smoke Exposure - Never Smoker  . Smokeless tobacco: Never Used  . Alcohol Use: No  . Drug Use: No  . Sexual Activity: Yes    Birth Control/ Protection: Surgical   Other Topics Concern  . Not on file   Social History Narrative  . No narrative on file     Review of Systems: General: negative for chills, fever, night sweats or weight changes.  Cardiovascular: negative for chest pain, dyspnea on exertion, edema, orthopnea, palpitations, paroxysmal nocturnal dyspnea or shortness of breath Dermatological: negative for rash Respiratory: negative for cough or wheezing Urologic: negative for hematuria Abdominal: negative for nausea, vomiting, diarrhea, bright red blood per rectum, melena, or hematemesis Neurologic: negative for visual changes, syncope, or dizziness All other systems reviewed and are otherwise negative except as noted above.    Blood pressure 130/70, pulse 72, height 5\' 9"  (1.753 m), weight 349 lb 6.4 oz (158.487 kg), last menstrual period 06/17/2012.  General appearance: alert, cooperative, no distress and morbidly obese Neck: no carotid bruit and no JVD Lungs: clear to auscultation bilaterally Heart: regular rate and rhythm, S1, S2 normal, no murmur, click, rub or gallop Extremities:  no LEE Pulses: 2+ and symmetric Skin: warm and dry Neurologic: Grossly normal  EKG not performed  ASSESSMENT AND PLAN:   1. Nonischemic cardiomyopathy: Stable. Euvolemic on physical exam. Blood pressure is 130/70. Heart Rate is 72 beats per minute. At her last office visit, her lisinopril was increased to a total of 20 mg daily. Today we will attempt to increase her Coreg to 25 mg twice a day. We will see how she tolerates this and hopefully it will not cause significant fatigue. She was instructed to continue her current dose of lisinopril and current dose of Lasix. She was instructed to monitor her blood pressure at home in the first few days after increasing her Coreg, to make  sure her blood pressure remains stable. She is to notify our office if she develops hypotension or becomes symptomatic. It was recommended that she continue with daily weights and a low-sodium diet.   PLAN  Titrate Coreg to 25 mg twice a day. Continue lisinopril at 10 mg twice a day and Lasix 40 mg twice a day. Followup in 2 weeks for further medication titration for heart failure. If her blood pressure allows, we will attempt to further increase her lisinopril.  SIMMONS, [place] 06/19/2013 9:24 PM

## 2013-06-21 ENCOUNTER — Telehealth: Payer: Self-pay | Admitting: Cardiology

## 2013-06-21 NOTE — Telephone Encounter (Signed)
New Message:  Pt states she has questions for Pam. States she will give more details when she call back.

## 2013-06-22 NOTE — Telephone Encounter (Signed)
Forms completed and records printed - will take to medical records to be scanned and faxed as requested.

## 2013-06-25 NOTE — Telephone Encounter (Signed)
Follow Up    Made pt aware of mess. from 6/12 regarding records.

## 2013-06-27 ENCOUNTER — Encounter: Payer: Self-pay | Admitting: Cardiovascular Disease

## 2013-06-28 ENCOUNTER — Encounter (HOSPITAL_COMMUNITY): Payer: Self-pay | Admitting: Emergency Medicine

## 2013-06-28 ENCOUNTER — Emergency Department (HOSPITAL_COMMUNITY): Payer: Medicaid Other

## 2013-06-28 ENCOUNTER — Emergency Department (HOSPITAL_COMMUNITY)
Admission: EM | Admit: 2013-06-28 | Discharge: 2013-06-28 | Disposition: A | Discharge status: Home / Self Care | Payer: Medicaid Other | Attending: Emergency Medicine | Admitting: Emergency Medicine

## 2013-06-28 DIAGNOSIS — Z8701 Personal history of pneumonia (recurrent): Secondary | ICD-10-CM | POA: Diagnosis not present

## 2013-06-28 DIAGNOSIS — Z880 Allergy status to penicillin: Secondary | ICD-10-CM | POA: Diagnosis not present

## 2013-06-28 DIAGNOSIS — Z79899 Other long term (current) drug therapy: Secondary | ICD-10-CM | POA: Insufficient documentation

## 2013-06-28 DIAGNOSIS — I10 Essential (primary) hypertension: Secondary | ICD-10-CM | POA: Diagnosis not present

## 2013-06-28 DIAGNOSIS — E119 Type 2 diabetes mellitus without complications: Secondary | ICD-10-CM | POA: Diagnosis not present

## 2013-06-28 DIAGNOSIS — Z8709 Personal history of other diseases of the respiratory system: Secondary | ICD-10-CM | POA: Insufficient documentation

## 2013-06-28 DIAGNOSIS — E669 Obesity, unspecified: Secondary | ICD-10-CM | POA: Diagnosis not present

## 2013-06-28 DIAGNOSIS — S199XXA Unspecified injury of neck, initial encounter: Secondary | ICD-10-CM | POA: Diagnosis not present

## 2013-06-28 DIAGNOSIS — E7800 Pure hypercholesterolemia, unspecified: Secondary | ICD-10-CM | POA: Insufficient documentation

## 2013-06-28 DIAGNOSIS — Z8529 Personal history of malignant neoplasm of other respiratory and intrathoracic organs: Secondary | ICD-10-CM | POA: Diagnosis not present

## 2013-06-28 DIAGNOSIS — Z7982 Long term (current) use of aspirin: Secondary | ICD-10-CM | POA: Diagnosis not present

## 2013-06-28 DIAGNOSIS — Y9389 Activity, other specified: Secondary | ICD-10-CM | POA: Insufficient documentation

## 2013-06-28 DIAGNOSIS — S298XXA Other specified injuries of thorax, initial encounter: Secondary | ICD-10-CM | POA: Insufficient documentation

## 2013-06-28 DIAGNOSIS — Z794 Long term (current) use of insulin: Secondary | ICD-10-CM | POA: Insufficient documentation

## 2013-06-28 DIAGNOSIS — IMO0002 Reserved for concepts with insufficient information to code with codable children: Secondary | ICD-10-CM | POA: Insufficient documentation

## 2013-06-28 DIAGNOSIS — R079 Chest pain, unspecified: Secondary | ICD-10-CM

## 2013-06-28 DIAGNOSIS — Y92410 Unspecified street and highway as the place of occurrence of the external cause: Secondary | ICD-10-CM | POA: Insufficient documentation

## 2013-06-28 DIAGNOSIS — Z862 Personal history of diseases of the blood and blood-forming organs and certain disorders involving the immune mechanism: Secondary | ICD-10-CM | POA: Diagnosis not present

## 2013-06-28 DIAGNOSIS — S0993XA Unspecified injury of face, initial encounter: Secondary | ICD-10-CM | POA: Insufficient documentation

## 2013-06-28 DIAGNOSIS — M545 Low back pain, unspecified: Secondary | ICD-10-CM

## 2013-06-28 LAB — CBC WITH DIFFERENTIAL/PLATELET
BASOS ABS: 0 10*3/uL (ref 0.0–0.1)
Basophils Relative: 0 % (ref 0–1)
EOS ABS: 0.1 10*3/uL (ref 0.0–0.7)
Eosinophils Relative: 1 % (ref 0–5)
HCT: 35.6 % — ABNORMAL LOW (ref 36.0–46.0)
Hemoglobin: 11.8 g/dL — ABNORMAL LOW (ref 12.0–15.0)
Lymphocytes Relative: 43 % (ref 12–46)
Lymphs Abs: 5.1 10*3/uL — ABNORMAL HIGH (ref 0.7–4.0)
MCH: 24.6 pg — AB (ref 26.0–34.0)
MCHC: 33.1 g/dL (ref 30.0–36.0)
MCV: 74.2 fL — ABNORMAL LOW (ref 78.0–100.0)
Monocytes Absolute: 0.6 10*3/uL (ref 0.1–1.0)
Monocytes Relative: 5 % (ref 3–12)
NEUTROS PCT: 51 % (ref 43–77)
Neutro Abs: 6.1 10*3/uL (ref 1.7–7.7)
PLATELETS: 307 10*3/uL (ref 150–400)
RBC: 4.8 MIL/uL (ref 3.87–5.11)
RDW: 16.6 % — ABNORMAL HIGH (ref 11.5–15.5)
WBC: 11.9 10*3/uL — ABNORMAL HIGH (ref 4.0–10.5)

## 2013-06-28 LAB — BASIC METABOLIC PANEL
BUN: 21 mg/dL (ref 6–23)
CO2: 24 mEq/L (ref 19–32)
Calcium: 9.7 mg/dL (ref 8.4–10.5)
Chloride: 104 mEq/L (ref 96–112)
Creatinine, Ser: 0.91 mg/dL (ref 0.50–1.10)
GFR, EST AFRICAN AMERICAN: 87 mL/min — AB (ref >90)
GFR, EST NON AFRICAN AMERICAN: 75 mL/min — AB (ref >90)
Glucose, Bld: 111 mg/dL — ABNORMAL HIGH (ref 70–99)
Potassium: 4.2 mEq/L (ref 3.7–5.3)
SODIUM: 142 meq/L (ref 137–147)

## 2013-06-28 LAB — I-STAT TROPONIN, ED: TROPONIN I, POC: 0.01 ng/mL (ref 0.00–0.08)

## 2013-06-28 MED ORDER — MORPHINE SULFATE 4 MG/ML IJ SOLN
4.0000 mg | Freq: Once | INTRAMUSCULAR | Status: AC
  Administered 2013-06-28: 4 mg via INTRAVENOUS
  Filled 2013-06-28: qty 1

## 2013-06-28 MED ORDER — ASPIRIN 81 MG PO CHEW
324.0000 mg | CHEWABLE_TABLET | Freq: Once | ORAL | Status: AC
  Administered 2013-06-28: 324 mg via ORAL
  Filled 2013-06-28: qty 4

## 2013-06-28 MED ORDER — OXYCODONE-ACETAMINOPHEN 5-325 MG PO TABS: 1.0000 | ORAL_TABLET | ORAL | Status: DC | PRN

## 2013-06-28 NOTE — ED Notes (Signed)
Patient transported to x-ray. ?

## 2013-06-28 NOTE — Discharge Instructions (Signed)
Take the prescribed medication as directed.  Rememeber, you will likely be sore for the next few days and you need to take it easy.  Avoid heavy lifting or strenuous activity that will further strain your muscles. Follow-up with Dr. Ronnald Ramp as needed. Return to the ED for new or worsening symptoms.

## 2013-06-28 NOTE — ED Notes (Signed)
The tech has cleaned the abrasion on the right knee and applied a dressing. The tech reported to the RN in charge.
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
## 2013-06-29 NOTE — ED Provider Notes (Signed)
Medical screening examination/treatment/procedure(s) were performed by non-physician practitioner and as supervising physician I was immediately available for consultation/collaboration.   EKG Interpretation None       Richarda Blade, MD 06/29/13 [phone]

## 2013-06-29 NOTE — ED Provider Notes (Signed)
Medical screening examination/treatment/procedure(s) were performed by non-physician practitioner and as supervising physician I was immediately available for consultation/collaboration.   EKG Interpretation None       Jasper Riling. Alvino Chapel, MD 06/29/13 3235

## 2013-07-04 ENCOUNTER — Other Ambulatory Visit: Payer: Self-pay | Admitting: Oncology

## 2013-07-05 ENCOUNTER — Ambulatory Visit (INDEPENDENT_AMBULATORY_CARE_PROVIDER_SITE_OTHER): Payer: Medicaid Other | Admitting: Cardiology

## 2013-07-05 ENCOUNTER — Encounter: Payer: Self-pay | Admitting: Cardiology

## 2013-07-05 VITALS — BP 90/60 | HR 100 | Ht 69.0 in | Wt 326.0 lb

## 2013-07-05 DIAGNOSIS — I420 Dilated cardiomyopathy: Secondary | ICD-10-CM

## 2013-07-05 DIAGNOSIS — I428 Other cardiomyopathies: Secondary | ICD-10-CM

## 2013-07-05 NOTE — Progress Notes (Signed)
HPI The patient has had a complicated history recently.  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  She was considered for radiation but it was decided that she did not need this.  During her evaluations she has been noted to have right pleural effusion.  She was referred to Dr. Elsworth Soho who sent her for an echo.  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  Perfusion imaging suggested a defect in the anteroseptal wall. However, cardiac catheterization demonstrated normal coronaries.   At the last visit she had her Coreg increased.  She did okay with this. She still has some dyspnea with exertion that is basically unchanged.  Today her BP is lower.  She has a little bit of lightheadedness but no presyncope or syncope. She's not having any chest pressure, neck or arm discomfort.   Allergies  Allergen Reactions  . Amoxicillin-Pot Clavulanate Diarrhea    Current Outpatient Prescriptions  Medication Sig Dispense Refill  . acetaminophen (TYLENOL) 500 MG tablet Take 1,000 mg by mouth daily as needed (pain).       Marland Kitchen albuterol (PROVENTIL HFA;VENTOLIN HFA) 108 (90 BASE) MCG/ACT inhaler Inhale 2 puffs into the lungs every 4 (four) hours as needed for wheezing.  1 Inhaler  0  . aspirin EC 325 MG EC tablet Take 1 tablet (325 mg total) by mouth daily.  30 tablet  0  . benzonatate (TESSALON PERLES) 100 MG capsule Take 1 capsule (100 mg total) by mouth 3 (three) times daily as needed for cough.  20 capsule  0  . carvedilol (COREG) 12.5 MG tablet Take 25 mg by mouth 2 (two) times daily.      . cholecalciferol (VITAMIN D) 1000 UNITS tablet Take 2,000 Units by mouth daily.      . Ferrous Fumarate 324 MG TABS TAKE 1 TABLET (106 MG OF IRON TOTAL) BY MOUTH EVERY MORNING.  30 tablet  1  . furosemide (LASIX) 40 MG tablet Take twice a day 6 hours apart  60 tablet  11  . gabapentin (NEURONTIN) 300 MG capsule Take 300 mg by mouth daily.       Marland Kitchen ibuprofen (ADVIL,MOTRIN) 200 MG tablet Take 800 mg by mouth daily as needed for mild pain.      Marland Kitchen insulin glargine (LANTUS) 100 UNIT/ML injection Inject 0.3 mLs (30 Units total) into the skin at bedtime.  10 mL  11  . insulin regular (NOVOLIN R,HUMULIN R) 100 units/mL injection Inject 20-30 Units into the skin 3 (three) times daily before meals. SSI      . lisinopril (PRINIVIL,ZESTRIL) 10 MG tablet Take 1 tablet (10 mg total) by mouth 2 (two) times daily.  60 tablet  3  . metFORMIN (GLUCOPHAGE) 500 MG tablet Take 1,000 mg by mouth 2 (two) times daily with a meal.      . Multiple Vitamin (MULTIVITAMIN WITH MINERALS) TABS Take 1 tablet by mouth every morning.       . nitroGLYCERIN (NITROSTAT) 0.4 MG SL tablet Place 1 tablet (0.4 mg total) under the tongue every 5 (five) minutes as needed for chest pain.  90 tablet  3  . oxyCODONE-acetaminophen (PERCOCET/ROXICET) 5-325 MG per tablet Take 1 tablet by mouth every 4 (four) hours as needed.  20 tablet  0  . potassium chloride (K-DUR,KLOR-CON) 20 MEQ tablet Take 1 tablet (20 mEq total) by mouth daily.  30 tablet  3  .  simvastatin (ZOCOR) 40 MG tablet Take 40 mg by mouth at bedtime.       No current facility-administered medications for this visit.    Past Medical History  Diagnosis Date  . Diabetes mellitus   . High cholesterol   . Leukocytosis   . Anemia   . Obesity   . Sickle cell trait   . Hypertension     takes medicine to protect kidneys, does not have HTN  . Bronchitis   . Pneumonia   . Thymoma     Past Surgical History  Procedure Laterality Date  . Tonsillectomy    . Tubal ligation    . Mediastinotomy chamberlain mcneil Right 11/06/2012    Procedure: MEDIASTINOTOMY CHAMBERLAIN MCNEILPROCEDURE;  Surgeon: Gaye Pollack, MD;  Location: [facility] OR;  Service: Thoracic;  Laterality: Right;  . Lymph node biopsy Right 11/30/2012    Procedure: RIGHT TONSIL BIOPSY WITH FRESH FROZEN ANALYSIS;  Surgeon: Jodi Marble, MD;  Location: [place];  Service: ENT;  Laterality: Right;  . Mediasternotomy N/A 01/12/2013    Procedure: MEDIAN STERNOTOMY;  Surgeon: Gaye Pollack, MD;  Location: [place];  Service: Thoracic;  Laterality: N/A;  . Resection of a thymoma N/A 01/12/2013    Procedure: RESECTION OF A THYMOMA;  Surgeon: Gaye Pollack, MD;  Location: [place];  Service: Thoracic;  Laterality: N/A;     ROS:  As stated in the HPI and negative for all other systems.  PHYSICAL EXAM BP 90/60  Pulse 100  Ht 5\' 9"  (1.753 m)  Wt 326 lb (147.873 kg)  BMI 48.12 kg/m2  LMP 06/17/2012 GENERAL:  Well appearing HEENT:  Pupils equal round and reactive, fundi not visualized, oral mucosa unremarkable NECK:  No jugular venous distention, waveform within normal limits, carotid upstroke brisk and symmetric, no bruits, no thyromegaly LUNGS:  Clear to auscultation bilaterally CHEST:  Unremarkable HEART:  PMI not displaced or sustained,S1 and S2 within normal limits, no S3, no S4, no clicks, no rubs, no murmurs ABD:  Flat, positive bowel sounds normal in frequency in pitch, no bruits, no rebound, no guarding, no midline pulsatile mass, no hepatomegaly, no splenomegaly EXT:  2 plus pulses throughout, trace edema, no cyanosis no clubbing   ASSESSMENT AND PLAN  DILATED CARDIOMYOPATHY:    Given the low blood pressure I will not titrate her medications at this point. I will see her back in about 2 months and see if we can do any further med titration. I would likely plan on repeating the echocardiogram in the fall to see if we had improvement in her ejection fraction.  Of note I did repeat her SBP and it was 96.    PROLONGED QT:  She has no symptoms related to this.  No change in therapy is indicated.  This was noted on the previous EKG.

## 2013-07-05 NOTE — Patient Instructions (Signed)
Your physician recommends that you schedule a follow-up appointment in: 2 months with Dr. Percival Spanish  Continue taking your same meds and call if you develop any problems

## 2013-07-08 ENCOUNTER — Emergency Department (HOSPITAL_COMMUNITY)
Admission: EM | Admit: 2013-07-08 | Discharge: 2013-07-08 | Disposition: A | Discharge status: Home / Self Care | Payer: Medicaid Other | Attending: Emergency Medicine | Admitting: Emergency Medicine

## 2013-07-08 ENCOUNTER — Emergency Department (HOSPITAL_COMMUNITY): Payer: Medicaid Other

## 2013-07-08 ENCOUNTER — Emergency Department (INDEPENDENT_AMBULATORY_CARE_PROVIDER_SITE_OTHER)
Admission: EM | Admit: 2013-07-08 | Discharge: 2013-07-08 | Disposition: A | Discharge status: Nursing Home (Includes ICF) | Payer: Self-pay | Source: Home / Self Care | Attending: Family Medicine | Admitting: Family Medicine

## 2013-07-08 ENCOUNTER — Encounter (HOSPITAL_COMMUNITY): Payer: Self-pay | Admitting: Emergency Medicine

## 2013-07-08 DIAGNOSIS — R079 Chest pain, unspecified: Secondary | ICD-10-CM | POA: Insufficient documentation

## 2013-07-08 DIAGNOSIS — M7989 Other specified soft tissue disorders: Secondary | ICD-10-CM | POA: Insufficient documentation

## 2013-07-08 DIAGNOSIS — IMO0001 Reserved for inherently not codable concepts without codable children: Secondary | ICD-10-CM | POA: Insufficient documentation

## 2013-07-08 DIAGNOSIS — Z79899 Other long term (current) drug therapy: Secondary | ICD-10-CM | POA: Insufficient documentation

## 2013-07-08 DIAGNOSIS — Z8709 Personal history of other diseases of the respiratory system: Secondary | ICD-10-CM | POA: Insufficient documentation

## 2013-07-08 DIAGNOSIS — E7800 Pure hypercholesterolemia, unspecified: Secondary | ICD-10-CM | POA: Insufficient documentation

## 2013-07-08 DIAGNOSIS — E119 Type 2 diabetes mellitus without complications: Secondary | ICD-10-CM | POA: Insufficient documentation

## 2013-07-08 DIAGNOSIS — D72829 Elevated white blood cell count, unspecified: Secondary | ICD-10-CM | POA: Insufficient documentation

## 2013-07-08 DIAGNOSIS — D649 Anemia, unspecified: Secondary | ICD-10-CM | POA: Insufficient documentation

## 2013-07-08 DIAGNOSIS — Z8701 Personal history of pneumonia (recurrent): Secondary | ICD-10-CM | POA: Insufficient documentation

## 2013-07-08 DIAGNOSIS — R042 Hemoptysis: Secondary | ICD-10-CM

## 2013-07-08 DIAGNOSIS — M7918 Myalgia, other site: Secondary | ICD-10-CM

## 2013-07-08 DIAGNOSIS — Z794 Long term (current) use of insulin: Secondary | ICD-10-CM | POA: Insufficient documentation

## 2013-07-08 DIAGNOSIS — E669 Obesity, unspecified: Secondary | ICD-10-CM | POA: Insufficient documentation

## 2013-07-08 DIAGNOSIS — I10 Essential (primary) hypertension: Secondary | ICD-10-CM | POA: Insufficient documentation

## 2013-07-08 DIAGNOSIS — Z7982 Long term (current) use of aspirin: Secondary | ICD-10-CM | POA: Insufficient documentation

## 2013-07-08 DIAGNOSIS — R0789 Other chest pain: Secondary | ICD-10-CM

## 2013-07-08 LAB — CBC WITH DIFFERENTIAL/PLATELET
BASOS ABS: 0 10*3/uL (ref 0.0–0.1)
Basophils Relative: 0 % (ref 0–1)
Eosinophils Absolute: 0.1 10*3/uL (ref 0.0–0.7)
Eosinophils Relative: 1 % (ref 0–5)
HCT: 33.6 % — ABNORMAL LOW (ref 36.0–46.0)
Hemoglobin: 11.1 g/dL — ABNORMAL LOW (ref 12.0–15.0)
LYMPHS PCT: 50 % — AB (ref 12–46)
Lymphs Abs: 4.9 10*3/uL — ABNORMAL HIGH (ref 0.7–4.0)
MCH: 24.9 pg — ABNORMAL LOW (ref 26.0–34.0)
MCHC: 33 g/dL (ref 30.0–36.0)
MCV: 75.3 fL — ABNORMAL LOW (ref 78.0–100.0)
Monocytes Absolute: 0.4 10*3/uL (ref 0.1–1.0)
Monocytes Relative: 4 % (ref 3–12)
NEUTROS ABS: 4.5 10*3/uL (ref 1.7–7.7)
Neutrophils Relative %: 45 % (ref 43–77)
PLATELETS: 272 10*3/uL (ref 150–400)
RBC: 4.46 MIL/uL (ref 3.87–5.11)
RDW: 16.7 % — AB (ref 11.5–15.5)
WBC: 10 10*3/uL (ref 4.0–10.5)

## 2013-07-08 LAB — COMPREHENSIVE METABOLIC PANEL
ALT: 12 U/L (ref 0–35)
AST: 11 U/L (ref 0–37)
Albumin: 3.5 g/dL (ref 3.5–5.2)
Alkaline Phosphatase: 104 U/L (ref 39–117)
BILIRUBIN TOTAL: 0.3 mg/dL (ref 0.3–1.2)
BUN: 17 mg/dL (ref 6–23)
CHLORIDE: 105 meq/L (ref 96–112)
CO2: 27 meq/L (ref 19–32)
Calcium: 9.3 mg/dL (ref 8.4–10.5)
Creatinine, Ser: 0.89 mg/dL (ref 0.50–1.10)
GFR calc Af Amer: 89 mL/min — ABNORMAL LOW (ref >90)
GFR, EST NON AFRICAN AMERICAN: 77 mL/min — AB (ref >90)
Glucose, Bld: 142 mg/dL — ABNORMAL HIGH (ref 70–99)
Potassium: 4.1 mEq/L (ref 3.7–5.3)
SODIUM: 145 meq/L (ref 137–147)
Total Protein: 6.3 g/dL (ref 6.0–8.3)

## 2013-07-08 LAB — TROPONIN I: Troponin I: <0.3 ng/mL (ref <0.30)

## 2013-07-08 LAB — PRO B NATRIURETIC PEPTIDE: Pro B Natriuretic peptide (BNP): 245.5 pg/mL — ABNORMAL HIGH (ref 0–125)

## 2013-07-08 MED ORDER — HYDROCODONE-ACETAMINOPHEN 5-325 MG PO TABS: 1.0000 | ORAL_TABLET | Freq: Four times a day (QID) | ORAL | Status: DC | PRN

## 2013-07-08 MED ORDER — IOHEXOL 350 MG/ML SOLN
80.0000 mL | Freq: Once | INTRAVENOUS | Status: AC | PRN
  Administered 2013-07-08: 80 mL via INTRAVENOUS

## 2013-07-08 MED ORDER — SODIUM CHLORIDE 0.9 % IV SOLN
Freq: Once | INTRAVENOUS | Status: AC
  Administered 2013-07-08: 14:00:00 via INTRAVENOUS

## 2013-07-08 NOTE — ED Notes (Signed)
C/o right side chest pain since 0700 today "throbby, achey and heavy" denies n/v denies sob denies sweaty feeling
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
## 2013-07-08 NOTE — Discharge Instructions (Signed)
°Chest Pain (Nonspecific) °It is often hard to give a specific diagnosis for the cause of chest pain. There is always a chance that your pain could be related to something serious, such as a heart attack or a blood clot in the lungs. You need to follow up with your health care provider for further evaluation. °CAUSES  °· Heartburn. °· Pneumonia or bronchitis. °· Anxiety or stress. °· Inflammation around your heart (pericarditis) or lung (pleuritis or pleurisy). °· A blood clot in the lung. °· A collapsed lung (pneumothorax). It can develop suddenly on its own (spontaneous pneumothorax) or from trauma to the chest. °· Shingles infection (herpes zoster virus). °The chest wall is composed of bones, muscles, and cartilage. Any of these can be the source of the pain. °· The bones can be bruised by injury. °· The muscles or cartilage can be strained by coughing or overwork. °· The cartilage can be affected by inflammation and become sore (costochondritis). °DIAGNOSIS  °Lab tests or other studies may be needed to find the cause of your pain. Your health care provider may have you take a test called an ambulatory electrocardiogram (ECG). An ECG records your heartbeat patterns over a 24-hour period. You may also have other tests, such as: °· Transthoracic echocardiogram (TTE). During echocardiography, sound waves are used to evaluate how blood flows through your heart. °· Transesophageal echocardiogram (TEE). °· Cardiac monitoring. This allows your health care provider to monitor your heart rate and rhythm in real time. °· Holter monitor. This is a portable device that records your heartbeat and can help diagnose heart arrhythmias. It allows your health care provider to track your heart activity for several days, if needed. °· Stress tests by exercise or by giving medicine that makes the heart beat faster. °TREATMENT  °· Treatment depends on what may be causing your chest pain. Treatment may include: °¨ Acid blockers for  heartburn. °¨ Anti-inflammatory medicine. °¨ Pain medicine for inflammatory conditions. °¨ Antibiotics if an infection is present. °· You may be advised to change lifestyle habits. This includes stopping smoking and avoiding alcohol, caffeine, and chocolate. °· You may be advised to keep your head raised (elevated) when sleeping. This reduces the chance of acid going backward from your stomach into your esophagus. °Most of the time, nonspecific chest pain will improve within 2-3 days with rest and mild pain medicine.  °HOME CARE INSTRUCTIONS  °· If antibiotics were prescribed, take them as directed. Finish them even if you start to feel better. °· For the next few days, avoid physical activities that bring on chest pain. Continue physical activities as directed. °· Do not use any tobacco products, including cigarettes, chewing tobacco, or electronic cigarettes. °· Avoid drinking alcohol. °· Only take medicine as directed by your health care provider. °· Follow your health care provider's suggestions for further testing if your chest pain does not go away. °· Keep any follow-up appointments you made. If you do not go to an appointment, you could develop lasting (chronic) problems with pain. If there is any problem keeping an appointment, call to reschedule. °SEEK MEDICAL CARE IF:  °· Your chest pain does not go away, even after treatment. °· You have a rash with blisters on your chest. °· You have a fever. °SEEK IMMEDIATE MEDICAL CARE IF:  °· You have increased chest pain or pain that spreads to your arm, neck, jaw, back, or abdomen. °· You have shortness of breath. °· You have an increasing cough, or you cough   up blood.  You have severe back or abdominal pain.  You feel nauseous or vomit.  You have severe weakness.  You faint.  You have chills. This is an emergency. Do not wait to see if the pain will go away. Get medical help at once. Call your local emergency services (911 in [place] Do not drive  yourself to the hospital. MAKE SURE YOU:   Understand these instructions.  Will watch your condition.  Will get help right away if you are not doing well or get worse. Document Released: 10/07/2004 Document Revised: 01/02/2013 Document Reviewed: 08/03/2007 [facility] Patient Information 2015 [place], [place]. This information is not intended to replace advice given to you by your health care provider. Make sure you discuss any questions you have with your health care provider.  Chest Wall Pain Chest wall pain is pain in or around the bones and muscles of your chest. It may take up to 6 weeks to get better. It may take longer if you must stay physically active in your work and activities.  CAUSES  Chest wall pain may happen on its own. However, it may be caused by:  A viral illness like the flu.  Injury.  Coughing.  Exercise.  Arthritis.  Fibromyalgia.  Shingles. HOME CARE INSTRUCTIONS   Avoid overtiring physical activity. Try not to strain or perform activities that cause pain. This includes any activities using your chest or your abdominal and side muscles, especially if heavy weights are used.  Put ice on the sore area.  Put ice in a plastic bag.  Place a towel between your skin and the bag.  Leave the ice on for 15-20 minutes per hour while awake for the first 2 days.  Only take over-the-counter or prescription medicines for pain, discomfort, or fever as directed by your caregiver. SEEK IMMEDIATE MEDICAL CARE IF:   Your pain increases, or you are very uncomfortable.  You have a fever.  Your chest pain becomes worse.  You have new, unexplained symptoms.  You have nausea or vomiting.  You feel sweaty or lightheaded.  You have a cough with phlegm (sputum), or you cough up blood. MAKE SURE YOU:   Understand these instructions.  Will watch your condition.  Will get help right away if you are not doing well or get worse. Document Released: 12/28/2004 Document  Revised: 03/22/2011 Document Reviewed: 08/24/2010 [facility] Patient Information 2015 [place], [place]. This information is not intended to replace advice given to you by your health care provider. Make sure you discuss any questions you have with your health care provider.    Metformin and X-ray Contrast Studies For some X-ray exams, a contrast dye is used. Contrast dye is a type of medicine used to make the X-ray image clearer. The contrast dye is given to the patient through a vein (intravenously). If you need to have this type of X-ray exam and you take a medication called metformin, your caregiver may have you stop taking metformin before the exam.  LACTIC ACIDOSIS In rare cases, a serious medical condition called lactic acidosis can develop in people who take metformin and receive contrast dye. The following conditions can increase the risk of this complication:   Kidney failure.  Liver problems.  Certain types of heart problems such as:  Heart failure.  Heart attack.  Heart infection.  Heart valve problems.  Alcohol abuse. If left untreated, lactic acidosis can lead to coma.  SYMPTOMS OF LACTIC ACIDOSIS Symptoms of lactic acidosis can include:  Rapid  breathing (hyperventilation).  Neurologic symptoms such as:  Headaches.  Confusion.  Dizziness.  Excessive sweating.  Feeling sick to your stomach (nauseous) or throwing up (vomiting). AFTER THE X-RAY EXAM  Stay well-hydrated. Drink fluids as instructed by your caregiver.  If you have a risk of developing lactic acidosis, blood tests may be done to make sure your kidney function is okay.  Metformin is usually stopped for 48 hours after the X-ray exam. Ask your caregiver when you can start taking metformin again. SEEK MEDICAL CARE IF:   You have shortness of breath or difficulty breathing.  You develop a headache that does not go away.  You have nausea or vomiting.  You urinate more than normal.  You develop  a skin rash and have:  Redness.  Swelling.  Itching. Document Released: 12/16/2008 Document Revised: 03/22/2011 Document Reviewed: 12/16/2008 [facility] Patient Information 2015 [place], [place]. This information is not intended to replace advice given to you by your health care provider. Make sure you discuss any questions you have with your health care provider.

## 2013-07-08 NOTE — ED Notes (Signed)
Patient transported to X-ray 
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
## 2013-07-08 NOTE — ED Provider Notes (Signed)
CSN: [id number]     Arrival date & time 07/08/13  1130 History   First MD Initiated Contact with Patient 07/08/13 1242     Chief Complaint  Patient presents with  . Chest Pain  . Hemoptysis   (Consider location/radiation/quality/duration/timing/severity/associated sxs/prior Treatment) HPI Comments: Patient presents with right sided constant burning chest pain that began at 7am this morning. Denies dyspnea but states she has developed a cough since pain began and has had 3 episodes of hemoptysis. Denies fever. Pain radiates to her right scapula. Also describes a sense of heaviness at right chest. 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 Cyndia Bent) Has also had recent 2D ECHO and cardiac cath (05/25/2013) with Dr. Corky Downs. Patient reports that cath showed "no blockages." Echo report consistent with structurally normal valves and EF of 25-30%.  Reports she is scheduled for follow up chest CT in August 2015.   Patient is a 48 y.o. female presenting with chest pain. The history is provided by the patient.  Chest Pain Pain location:  R chest Pain quality: burning   Pain radiates to:  R shoulder and mid back Pain radiates to the back: yes   Pain severity:  Moderate Timing:  Constant Chronicity:  New Relieved by:  Nothing Exacerbated by: nothing. Ineffective treatments:  None tried Associated symptoms: back pain and cough   Associated symptoms: no abdominal pain, no dizziness, no dysphagia, no fatigue, no fever, no headache, no heartburn, no nausea, no near-syncope, no orthopnea, no palpitations, no shortness of breath, no syncope, not vomiting and no weakness     Past Medical History  Diagnosis Date  . Diabetes mellitus   . High cholesterol   . Leukocytosis   . Anemia   . Obesity   . Sickle cell trait   . Hypertension     takes medicine to protect kidneys, does not have HTN  . Bronchitis   . Pneumonia   . Thymoma    Past Surgical History  Procedure Laterality Date  .  Tonsillectomy    . Tubal ligation    . Mediastinotomy chamberlain mcneil Right 11/06/2012    Procedure: MEDIASTINOTOMY CHAMBERLAIN MCNEILPROCEDURE;  Surgeon: Gaye Pollack, MD;  Location: [facility] OR;  Service: Thoracic;  Laterality: Right;  . Lymph node biopsy Right 11/30/2012    Procedure: RIGHT TONSIL BIOPSY WITH FRESH FROZEN ANALYSIS;  Surgeon: Jodi Marble, MD;  Location: [place];  Service: ENT;  Laterality: Right;  . Mediasternotomy N/A 01/12/2013    Procedure: MEDIAN STERNOTOMY;  Surgeon: Gaye Pollack, MD;  Location: [place];  Service: Thoracic;  Laterality: N/A;  . Resection of a thymoma N/A 01/12/2013    Procedure: RESECTION OF A THYMOMA;  Surgeon: Gaye Pollack, MD;  Location: [facility] OR;  Service: Thoracic;  Laterality: N/A;   Family History  Problem Relation Age of Onset  . Heart disease Father     No details  . Diabetes type II      Family HX   History  Substance Use Topics  . Smoking status: Passive Smoke Exposure - Never Smoker  . Smokeless tobacco: Never Used  . Alcohol Use: No   OB History   Grav Para Term Preterm Abortions TAB SAB Ect Mult Living                 Review of Systems  Constitutional: Negative for fever and fatigue.  HENT: Negative for trouble swallowing.   Respiratory: Positive for cough and chest tightness. Negative for shortness  of breath.   Cardiovascular: Positive for chest pain. Negative for palpitations, orthopnea, syncope and near-syncope.  Gastrointestinal: Negative for heartburn, nausea, vomiting and abdominal pain.  Genitourinary: Negative.   Musculoskeletal: Positive for back pain.  Neurological: Negative for dizziness, weakness and headaches.    Allergies  Amoxicillin-pot clavulanate  Home Medications   Prior to Admission medications   Medication Sig Start Date End Date Taking? Authorizing Provider  acetaminophen (TYLENOL) 500 MG tablet Take 1,000 mg by mouth daily as needed (pain).     Historical Provider, MD  albuterol (PROVENTIL  HFA;VENTOLIN HFA) 108 (90 BASE) MCG/ACT inhaler Inhale 2 puffs into the lungs every 4 (four) hours as needed for wheezing. 10/30/12   Margarita Mail, PA-C  aspirin EC 325 MG EC tablet Take 1 tablet (325 mg total) by mouth daily. 01/23/13   Donielle Liston Alba, PA-C  benzonatate (TESSALON PERLES) 100 MG capsule Take 1 capsule (100 mg total) by mouth 3 (three) times daily as needed for cough. 05/24/13   Wyatt Portela, MD  carvedilol (COREG) 12.5 MG tablet Take 25 mg by mouth 2 (two) times daily. 05/30/13   Minus Breeding, MD  cholecalciferol (VITAMIN D) 1000 UNITS tablet Take 2,000 Units by mouth daily.    Historical Provider, MD  Ferrous Fumarate 324 MG TABS TAKE 1 TABLET (106 MG OF IRON TOTAL) BY MOUTH EVERY MORNING. 07/04/13   Wyatt Portela, MD  furosemide (LASIX) 40 MG tablet Take twice a day 6 hours apart 05/30/13   Minus Breeding, MD  gabapentin (NEURONTIN) 300 MG capsule Take 300 mg by mouth daily.    Historical Provider, MD  ibuprofen (ADVIL,MOTRIN) 200 MG tablet Take 800 mg by mouth daily as needed for mild pain.    Historical Provider, MD  insulin glargine (LANTUS) 100 UNIT/ML injection Inject 0.3 mLs (30 Units total) into the skin at bedtime. 01/24/13   Erin Barrett, PA-C  insulin regular (NOVOLIN R,HUMULIN R) 100 units/mL injection Inject 20-30 Units into the skin 3 (three) times daily before meals. SSI    Historical Provider, MD  lisinopril (PRINIVIL,ZESTRIL) 10 MG tablet Take 1 tablet (10 mg total) by mouth 2 (two) times daily. 06/08/13   Minus Breeding, MD  metFORMIN (GLUCOPHAGE) 500 MG tablet Take 1,000 mg by mouth 2 (two) times daily with a meal. 01/24/13   Erin Barrett, PA-C  Multiple Vitamin (MULTIVITAMIN WITH MINERALS) TABS Take 1 tablet by mouth every morning.     Historical Provider, MD  nitroGLYCERIN (NITROSTAT) 0.4 MG SL tablet Place 1 tablet (0.4 mg total) under the tongue every 5 (five) minutes as needed for chest pain. 05/18/13   Brittainy Simmons, PA-C  oxyCODONE-acetaminophen  (PERCOCET/ROXICET) 5-325 MG per tablet Take 1 tablet by mouth every 4 (four) hours as needed. 06/28/13   Larene Pickett, PA-C  potassium chloride (K-DUR,KLOR-CON) 20 MEQ tablet Take 1 tablet (20 mEq total) by mouth daily. 05/30/13   Minus Breeding, MD  simvastatin (ZOCOR) 40 MG tablet Take 40 mg by mouth at bedtime.    Historical Provider, MD   BP 115/53  Pulse 82  Temp(Src) 98.1 F (36.7 C) (Oral)  Resp 18  SpO2 100%  LMP 06/17/2012 Physical Exam  Nursing note and vitals reviewed. Constitutional: She is oriented to person, place, and time. She appears well-developed and well-nourished. No distress.  +morbidly obese Mild hypotension and no hypoxia or tachycardia  HENT:  Head: Normocephalic and atraumatic.  Eyes: Conjunctivae are normal. No scleral icterus.  Cardiovascular: Normal rate, regular rhythm and  normal heart sounds.   Pulmonary/Chest: Effort normal and breath sounds normal. No respiratory distress. She has no wheezes.  +tenderness with palpation of right upper anterior chest wall  Abdominal: Soft. Bowel sounds are normal. She exhibits no distension. There is no tenderness.  Exam limited by body habitus  Musculoskeletal: Normal range of motion.  Neurological: She is alert and oriented to person, place, and time.  Skin: Skin is warm and dry.  Well healed scars at right and central chest with slight keloid formation  Psychiatric: She has a normal mood and affect. Her behavior is normal.    ED Course  Procedures (including critical care time) Labs Review Labs Reviewed - No data to display  Imaging Review No results found.   MDM   1. Other chest pain   2. Hemoptysis    ECG: NSR @ 78 bpm with prolongation of QT interval. No acute changes when compared to [place] from 06-28-2013.  Atypical chest pain in morbidly obese diabetic patient with known LV dysfunction and new onset cough and hemoptysis. Normal recent cardiac cath, however, patient is s/p thoracic surgery in [name].  2015. Will transfer to [facility] ER for possible chest CT and further evaluation.    [place], [place] 07/08/13 1328

## 2013-07-08 NOTE — ED Notes (Signed)
Patient transported to CT 

## 2013-07-08 NOTE — ED Notes (Signed)
Pt comfortable with discharge and follow up instructions. Prescriptions x1 

## 2013-07-08 NOTE — ED Notes (Signed)
carelink notified 

## 2013-07-09 NOTE — ED Provider Notes (Signed)
Pt with right side sharp, pleuritic CP, reproducible with palpation.  Does not sound cardiac.  No evidence of PE on CTA, but limited study.  No tachycardia, hypoxia.  Feel that this is likely musculoskeletal.  Will d/c.  I saw and evaluated the patient, reviewed the resident's note and I agree with the findings and plan.   EKG Interpretation   Date/Time:  Sunday July 08 2013 14:26:41 EDT Ventricular Rate:  80 PR Interval:  143 QRS Duration: 93 QT Interval:  420 QTC Calculation: 484 R Axis:     Text Interpretation:  Sinus rhythm Baseline wander in lead(s) II III aVR  aVF Artifact Confirmed by Wyvonnia Dusky  MD, STEPHEN ([id number]) on 07/08/2013  2:45:11 PM        Malvin Johns, MD 07/09/13 [id number]

## 2013-07-10 NOTE — ED Provider Notes (Signed)
Medical screening examination/treatment/procedure(s) were performed by a resident physician or non-physician practitioner and as the supervising physician I was immediately available for consultation/collaboration.  Evan Corey, MD    Evan S Corey, MD 07/10/13 0735 

## 2013-07-12 NOTE — Telephone Encounter (Signed)
Encounter complete. 

## 2013-07-20 ENCOUNTER — Other Ambulatory Visit: Payer: Self-pay | Admitting: *Deleted

## 2013-07-20 ENCOUNTER — Telehealth: Payer: Self-pay | Admitting: Cardiology

## 2013-07-20 MED ORDER — CARVEDILOL 25 MG PO TABS: 25.0000 mg | ORAL_TABLET | Freq: Two times a day (BID) | ORAL | Status: DC

## 2013-07-20 NOTE — Telephone Encounter (Signed)
Dr. Gwenlyn Found please advise of dose patient should restart.

## 2013-07-20 NOTE — Telephone Encounter (Signed)
Coreg 25 mg by mouth twice a day

## 2013-07-20 NOTE — Telephone Encounter (Signed)
Patient has a question regarding her medications.

## 2013-07-20 NOTE — Telephone Encounter (Signed)
Patient called to confirm what dose of carvedilol she should be taking. States the pharmacy says 12.5 mg twice daily and "someone told her to take 25 mg twice daily."  After reviewing patient's records I informed her she should be taking 25 mg twice daily. Patient then informed me that she continued the 12.5 mg dose , however for the last (2) days she has not taken any at all because she ran out. She does not know what her blood pressure and heart rate is. Informed her that I don't know what dose the doctor prefers for her to take right now. I will need to consult with the doctor/PA and call her back to advise.

## 2013-07-23 NOTE — Telephone Encounter (Signed)
Patient states she was notified to take coreg 25mg  bid.

## 2013-08-15 ENCOUNTER — Other Ambulatory Visit: Payer: Self-pay | Admitting: *Deleted

## 2013-08-15 NOTE — Progress Notes (Signed)
Patient calling to say she has no insurance until 08/25/13. pof to schedulers to move existing appts for labs,CT scan, and dr visit.

## 2013-08-18 ENCOUNTER — Telehealth: Payer: Self-pay | Admitting: Oncology

## 2013-08-18 NOTE — Telephone Encounter (Signed)
Labs/ov r/s per 08/05 POF no ins. until after 08/15, mailed out updated schedule to pt......KJ

## 2013-08-18 NOTE — Telephone Encounter (Signed)
Labs/ov r/s per 08/05 POF, lft msg for pt, mailed updated schedule...Marland KitchenMarland KitchenKJ

## 2013-08-21 ENCOUNTER — Other Ambulatory Visit: Payer: Medicaid Other

## 2013-08-21 ENCOUNTER — Ambulatory Visit (HOSPITAL_COMMUNITY): Payer: No Typology Code available for payment source

## 2013-08-23 ENCOUNTER — Ambulatory Visit: Payer: Medicaid Other | Admitting: Oncology

## 2013-09-07 ENCOUNTER — Other Ambulatory Visit: Payer: Self-pay | Admitting: Physician Assistant

## 2013-09-07 DIAGNOSIS — D150 Benign neoplasm of thymus: Principal | ICD-10-CM

## 2013-09-07 DIAGNOSIS — D4989 Neoplasm of unspecified behavior of other specified sites: Secondary | ICD-10-CM

## 2013-09-07 DIAGNOSIS — D72829 Elevated white blood cell count, unspecified: Secondary | ICD-10-CM

## 2013-09-10 ENCOUNTER — Ambulatory Visit (HOSPITAL_COMMUNITY)
Admission: RE | Admit: 2013-09-10 | Discharge: 2013-09-10 | Disposition: A | Discharge status: Home / Self Care | Payer: BC Managed Care – PPO | Source: Ambulatory Visit | Attending: Oncology | Admitting: Oncology

## 2013-09-10 ENCOUNTER — Encounter (HOSPITAL_COMMUNITY): Payer: Self-pay

## 2013-09-10 ENCOUNTER — Other Ambulatory Visit (HOSPITAL_BASED_OUTPATIENT_CLINIC_OR_DEPARTMENT_OTHER): Payer: BC Managed Care – PPO

## 2013-09-10 DIAGNOSIS — D150 Benign neoplasm of thymus: Secondary | ICD-10-CM | POA: Insufficient documentation

## 2013-09-10 DIAGNOSIS — R911 Solitary pulmonary nodule: Secondary | ICD-10-CM | POA: Insufficient documentation

## 2013-09-10 DIAGNOSIS — K8020 Calculus of gallbladder without cholecystitis without obstruction: Secondary | ICD-10-CM | POA: Insufficient documentation

## 2013-09-10 DIAGNOSIS — D4989 Neoplasm of unspecified behavior of other specified sites: Secondary | ICD-10-CM

## 2013-09-10 DIAGNOSIS — D72829 Elevated white blood cell count, unspecified: Secondary | ICD-10-CM

## 2013-09-10 DIAGNOSIS — C37 Malignant neoplasm of thymus: Secondary | ICD-10-CM

## 2013-09-10 LAB — BASIC METABOLIC PANEL (CC13)
Anion Gap: 8 mEq/L (ref 3–11)
BUN: 17.1 mg/dL (ref 7.0–26.0)
CALCIUM: 9.3 mg/dL (ref 8.4–10.4)
CO2: 27 meq/L (ref 22–29)
CREATININE: 0.9 mg/dL (ref 0.6–1.1)
Chloride: 108 mEq/L (ref 98–109)
Glucose: 167 mg/dl — ABNORMAL HIGH (ref 70–140)
Potassium: 4.1 mEq/L (ref 3.5–5.1)
Sodium: 144 mEq/L (ref 136–145)

## 2013-09-10 LAB — CBC WITH DIFFERENTIAL/PLATELET
BASO%: 0.2 % (ref 0.0–2.0)
Basophils Absolute: 0 10*3/uL (ref 0.0–0.1)
EOS%: 0.4 % (ref 0.0–7.0)
Eosinophils Absolute: 0 10*3/uL (ref 0.0–0.5)
HEMATOCRIT: 36.5 % (ref 34.8–46.6)
HGB: 11.9 g/dL (ref 11.6–15.9)
LYMPH%: 45 % (ref 14.0–49.7)
MCH: 24.8 pg — AB (ref 25.1–34.0)
MCHC: 32.6 g/dL (ref 31.5–36.0)
MCV: 76 fL — ABNORMAL LOW (ref 79.5–101.0)
MONO#: 0.4 10*3/uL (ref 0.1–0.9)
MONO%: 4.5 % (ref 0.0–14.0)
NEUT#: 4.7 10*3/uL (ref 1.5–6.5)
NEUT%: 49.9 % (ref 38.4–76.8)
Platelets: 255 10*3/uL (ref 145–400)
RBC: 4.8 10*6/uL (ref 3.70–5.45)
RDW: 16.3 % — ABNORMAL HIGH (ref 11.2–14.5)
WBC: 9.4 10*3/uL (ref 3.9–10.3)
lymph#: 4.2 10*3/uL — ABNORMAL HIGH (ref 0.9–3.3)

## 2013-09-10 MED ORDER — IOHEXOL 300 MG/ML  SOLN
100.0000 mL | Freq: Once | INTRAMUSCULAR | Status: AC | PRN
  Administered 2013-09-10: 100 mL via INTRAVENOUS

## 2013-09-10 MED ORDER — IOHEXOL 300 MG/ML  SOLN
50.0000 mL | Freq: Once | INTRAMUSCULAR | Status: AC | PRN
  Administered 2013-09-10: 50 mL via ORAL

## 2013-09-11 ENCOUNTER — Encounter: Payer: Self-pay | Admitting: Cardiology

## 2013-09-11 ENCOUNTER — Ambulatory Visit (INDEPENDENT_AMBULATORY_CARE_PROVIDER_SITE_OTHER): Payer: BC Managed Care – PPO | Admitting: Cardiology

## 2013-09-11 VITALS — BP 120/60 | Ht 69.0 in | Wt 366.0 lb

## 2013-09-11 DIAGNOSIS — R5381 Other malaise: Secondary | ICD-10-CM

## 2013-09-11 DIAGNOSIS — R079 Chest pain, unspecified: Secondary | ICD-10-CM

## 2013-09-11 DIAGNOSIS — R5383 Other fatigue: Secondary | ICD-10-CM

## 2013-09-11 NOTE — Patient Instructions (Addendum)
Your physician recommends that you schedule a follow-up appointment  After your have your thyroid profile blood work  We will order a thyroid profile in October   Take  Lasix 40 mg extra today and tomorrow and take an  Extra potassium with those extra doses of lasix

## 2013-09-11 NOTE — Progress Notes (Signed)
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  Perfusion imaging suggested a defect in the anteroseptal wall. However, cardiac catheterization demonstrated normal coronaries.   We have titrated her meds.  She continues to have fatigue and shortness of breath. Her weight has been creeping up. Of note the weight recorded at the last visit was incorrect. She's not having any new PND or orthopnea. She has continued leg weakness and is working with physical therapy for this.   Allergies  Allergen Reactions  . Amoxicillin-Pot Clavulanate Diarrhea    Current Outpatient Prescriptions  Medication Sig Dispense Refill  . acetaminophen (TYLENOL) 500 MG tablet Take 1,000 mg by mouth daily as needed (pain).       Marland Kitchen albuterol (PROVENTIL HFA;VENTOLIN HFA) 108 (90 BASE) MCG/ACT inhaler Inhale 1-2 puffs into the lungs every 6 (six) hours as needed for wheezing or shortness of breath.      Marland Kitchen aspirin EC 325 MG tablet Take 325 mg by mouth daily.      . carvedilol (COREG) 25 MG tablet Take 1 tablet (25 mg total) by mouth 2 (two) times daily.  60 tablet  6  . cholecalciferol (VITAMIN D) 1000 UNITS tablet Take 2,000 Units by mouth daily.      . ferrous fumarate (HEMOCYTE - 106 MG FE) 325 (106 FE) MG TABS tablet Take 1 tablet by mouth daily.      . fluticasone (FLONASE) 50 MCG/ACT nasal spray Place 1 spray into both nostrils daily as needed for allergies or rhinitis.      . furosemide (LASIX) 40 MG tablet Take 40 mg by mouth 2 (two) times daily.      Marland Kitchen gabapentin (NEURONTIN) 300 MG capsule Take 300 mg by mouth at bedtime.       Marland Kitchen HYDROcodone-acetaminophen (NORCO/VICODIN) 5-325 MG per tablet Take 1 tablet by mouth every 6 (six) hours as needed.  7 tablet  0  . insulin glargine (LANTUS) 100 UNIT/ML injection Inject 30 Units into the skin at bedtime.      . insulin regular (NOVOLIN R,HUMULIN R) 100 units/mL injection Inject 20-30 Units into the skin 3 (three) times daily before meals.  SSI      . lisinopril (PRINIVIL,ZESTRIL) 10 MG tablet Take 10 mg by mouth 2 (two) times daily.      . metFORMIN (GLUCOPHAGE) 1000 MG tablet Take 1,000 mg by mouth 2 (two) times daily with a meal.      . Multiple Vitamin (MULTIVITAMIN WITH MINERALS) TABS Take 1 tablet by mouth every morning.       . nitroGLYCERIN (NITROSTAT) 0.4 MG SL tablet Place 0.4 mg under the tongue every 5 (five) minutes as needed for chest pain.      . potassium chloride SA (K-DUR,KLOR-CON) 20 MEQ tablet Take 20 mEq by mouth daily.      . simvastatin (ZOCOR) 40 MG tablet Take 40 mg by mouth at bedtime.       No current facility-administered medications for this visit.    Past Medical History  Diagnosis Date  . Diabetes mellitus   . High cholesterol   . Leukocytosis   . Anemia   . Obesity   . Sickle cell trait   . Hypertension     takes medicine to protect kidneys, does not have HTN  . Bronchitis   . Pneumonia   . Thymoma     Past Surgical History  Procedure Laterality Date  . Tonsillectomy    .  Tubal ligation    . Mediastinotomy chamberlain mcneil Right 11/06/2012    Procedure: MEDIASTINOTOMY CHAMBERLAIN MCNEILPROCEDURE;  Surgeon: Gaye Pollack, MD;  Location: [facility] OR;  Service: Thoracic;  Laterality: Right;  . Lymph node biopsy Right 11/30/2012    Procedure: RIGHT TONSIL BIOPSY WITH FRESH FROZEN ANALYSIS;  Surgeon: Jodi Marble, MD;  Location: [place];  Service: ENT;  Laterality: Right;  . Mediasternotomy N/A 01/12/2013    Procedure: MEDIAN STERNOTOMY;  Surgeon: Gaye Pollack, MD;  Location: [place];  Service: Thoracic;  Laterality: N/A;  . Resection of a thymoma N/A 01/12/2013    Procedure: RESECTION OF A THYMOMA;  Surgeon: Gaye Pollack, MD;  Location: [place];  Service: Thoracic;  Laterality: N/A;     ROS:  As stated in the HPI and negative for all other systems.  PHYSICAL EXAM BP 120/60  Ht 5\' 9"  (1.753 m)  Wt 366 lb (166.017 kg)  BMI 54.02 kg/m2  LMP 06/17/2012 GENERAL:  Well appearing HEENT:   Pupils equal round and reactive, fundi not visualized, oral mucosa unremarkable NECK:  No jugular venous distention, waveform within normal limits, carotid upstroke brisk and symmetric, no bruits, no thyromegaly LUNGS:  Clear to auscultation bilaterally CHEST:  Unremarkable HEART:  PMI not displaced or sustained,S1 and S2 within normal limits, no S3, no S4, no clicks, no rubs, no murmurs ABD:  Flat, positive bowel sounds normal in frequency in pitch, no bruits, no rebound, no guarding, no midline pulsatile mass, no hepatomegaly, no splenomegaly EXT:  2 plus pulses throughout, trace edema, no cyanosis no clubbing  EKG:  Sinus rhythm, rate 91, LAD, QTC slightly prolonged, poor anterior R wave progression, no acute ST T wave changes.  09/11/2013  ASSESSMENT AND PLAN  DILATED CARDIOMYOPATHY:    She's on a good medical regimen at this point given her fatigue I will not titrate further. I might consider adding spironolactone in the future if her BP allows.  I will check an echo in October.    She does have some excess volume she's going to take Lasix 40 mg for 2 days with extra potassium 20 mEq. I would like her to get a thyroid profile when she comes back in October.  PROLONGED QT:  She has no symptoms related to this.  No change in therapy is indicated.  This was noted on the previous EKG.   OBESITY:  This is contributing to her symptoms.  I had a long discussion with her about this.  She is going to refer herself to a weight loss clinic in [place].  And I will refill this for him he is lifting

## 2013-09-12 ENCOUNTER — Ambulatory Visit (HOSPITAL_BASED_OUTPATIENT_CLINIC_OR_DEPARTMENT_OTHER): Payer: BC Managed Care – PPO | Admitting: Oncology

## 2013-09-12 ENCOUNTER — Encounter: Payer: Self-pay | Admitting: Oncology

## 2013-09-12 ENCOUNTER — Telehealth: Payer: Self-pay | Admitting: Oncology

## 2013-09-12 VITALS — BP 126/58 | HR 113 | Temp 98.3°F | Resp 20 | Ht 69.0 in | Wt 365.6 lb

## 2013-09-12 DIAGNOSIS — R718 Other abnormality of red blood cells: Secondary | ICD-10-CM

## 2013-09-12 DIAGNOSIS — D150 Benign neoplasm of thymus: Principal | ICD-10-CM

## 2013-09-12 DIAGNOSIS — C37 Malignant neoplasm of thymus: Secondary | ICD-10-CM

## 2013-09-12 DIAGNOSIS — D4989 Neoplasm of unspecified behavior of other specified sites: Secondary | ICD-10-CM

## 2013-09-12 NOTE — Telephone Encounter (Signed)
gv and printed appt sched and avs for pt for March 2013

## 2013-09-12 NOTE — Progress Notes (Signed)
Hematology and Oncology Follow Up Visit  LARUEN RISSER [id number] 1965-01-30 48 y.o. 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 She presented with a large mediastinal mass and shortness of breath.  Past therapy: She is status post median sternotomy with resection of the thymoma on 01/12/13.   Current therapy: Observation and surveillance.  Interim History: Ms. Buikema presents today for a follow up visit.  Since her last visit, she did relatively fair. She still has some issues with fluid retention and edema. She is currently on diuretics for congestive heart.  She also have had some problems with shortness of breath and trouble breathing at times. Lasix have helped with the symptoms but she still have some congestion at times. She does report some occasional exertional dyspnea. She is not reporting any headaches or blurry vision or syncope. She does not report any palpitation or orthopnea. She does not report any nausea or vomiting or abdominal pain. She has not reported any fevers or chills or sweats. Her rest or view of system is unremarkable.  Medications: I have reviewed the patient's current medications.  Current Outpatient Prescriptions  Medication Sig Dispense Refill  . acetaminophen (TYLENOL) 500 MG tablet Take 1,000 mg by mouth daily as needed (pain).       Marland Kitchen albuterol (PROVENTIL HFA;VENTOLIN HFA) 108 (90 BASE) MCG/ACT inhaler Inhale 1-2 puffs into the lungs every 6 (six) hours as needed for wheezing or shortness of breath.      Marland Kitchen aspirin EC 325 MG tablet Take 325 mg by mouth daily.      . carvedilol (COREG) 25 MG tablet Take 1 tablet (25 mg total) by mouth 2 (two) times daily.  60 tablet  6  . cholecalciferol (VITAMIN D) 1000 UNITS tablet Take 2,000 Units by mouth daily.      . ferrous fumarate (HEMOCYTE - 106 MG FE) 325 (106 FE) MG TABS tablet Take 1 tablet by mouth daily.      . fluticasone (FLONASE) 50 MCG/ACT nasal spray  Place 1 spray into both nostrils daily as needed for allergies or rhinitis.      . furosemide (LASIX) 40 MG tablet Take 40 mg by mouth 2 (two) times daily.      Marland Kitchen gabapentin (NEURONTIN) 300 MG capsule Take 300 mg by mouth at bedtime.       Marland Kitchen HYDROcodone-acetaminophen (NORCO/VICODIN) 5-325 MG per tablet Take 1 tablet by mouth every 6 (six) hours as needed.  7 tablet  0  . insulin glargine (LANTUS) 100 UNIT/ML injection Inject 30 Units into the skin at bedtime.      . insulin regular (NOVOLIN R,HUMULIN R) 100 units/mL injection Inject 20-30 Units into the skin 3 (three) times daily before meals. SSI      . lisinopril (PRINIVIL,ZESTRIL) 10 MG tablet Take 10 mg by mouth 2 (two) times daily.      . metFORMIN (GLUCOPHAGE) 1000 MG tablet Take 1,000 mg by mouth 2 (two) times daily with a meal.      . Multiple Vitamin (MULTIVITAMIN WITH MINERALS) TABS Take 1 tablet by mouth every morning.       . nitroGLYCERIN (NITROSTAT) 0.4 MG SL tablet Place 0.4 mg under the tongue every 5 (five) minutes as needed for chest pain.      . potassium chloride SA (K-DUR,KLOR-CON) 20 MEQ tablet Take 20 mEq by mouth daily.      . simvastatin (ZOCOR) 40 MG tablet Take  40 mg by mouth at bedtime.       No current facility-administered medications for this visit.    Allergies:  Allergies  Allergen Reactions  . Amoxicillin-Pot Clavulanate Diarrhea     Physical Exam: Blood pressure 126/58, pulse 113, temperature 98.3 F (36.8 C), temperature source Oral, resp. rate 20, height 5\' 9"  (1.753 m), weight 365 lb 9.6 oz (165.835 kg), last menstrual period 06/17/2012, SpO2 100.00%. ECOG: 1 General appearance: alert awake not in any distress. Head: Normocephalic, without obvious abnormality, atraumatic Neck: no adenopathy.  Lymph nodes: Cervical, supraclavicular, and axillary nodes normal. Heart:regular rate and rhythm, S1, S2 normal, no murmur, click, rub or gallop Lung:chest clear, no wheezing, rales, normal symmetric air  entry Abdomin: soft, non-tender, without masses or organomegaly EXT:no erythema, induration, or nodules. 1+ edema noted.   Lab Results: Lab Results  Component Value Date   WBC 9.4 09/10/2013   HGB 11.9 09/10/2013   HCT 36.5 09/10/2013   MCV 76.0* 09/10/2013   PLT 255 09/10/2013     Chemistry      Component Value Date/Time   NA 144 09/10/2013 1048   NA 145 07/08/2013 1451   K 4.1 09/10/2013 1048   K 4.1 07/08/2013 1451   CL 105 07/08/2013 1451   CL 108* 03/29/2012 0945   CO2 27 09/10/2013 1048   CO2 27 07/08/2013 1451   BUN 17.1 09/10/2013 1048   BUN 17 07/08/2013 1451   CREATININE 0.9 09/10/2013 1048   CREATININE 0.89 07/08/2013 1451   CREATININE 0.85 05/30/2013 0943      Component Value Date/Time   CALCIUM 9.3 09/10/2013 1048   CALCIUM 9.3 07/08/2013 1451   ALKPHOS 104 07/08/2013 1451   ALKPHOS 90 03/29/2012 0945   AST 11 07/08/2013 1451   AST 23 03/29/2012 0945   ALT 12 07/08/2013 1451   ALT 31 03/29/2012 0945   BILITOT 0.3 07/08/2013 1451   BILITOT 0.23 03/29/2012 0945      EXAM:  CT CHEST, ABDOMEN, AND PELVIS WITH CONTRAST  TECHNIQUE:  Multidetector CT imaging of the chest, abdomen and pelvis was  performed following the standard protocol during bolus  administration of intravenous contrast.  CONTRAST: 38mL OMNIPAQUE IOHEXOL 300 MG/ML SOLN, 122mL OMNIPAQUE  IOHEXOL 300 MG/ML SOLN  COMPARISON: None.  FINDINGS:  CT CHEST FINDINGS  No pleural effusion. No airspace consolidation or atelectasis. Small  pulmonary nodule within the left lower lobe measures 4 mm and is  unchanged from previous exam, image 34/series 4.  The patient is status post median sternotomy and CABG procedure.  There has been resection of previous anterior mediastinal mass. No  evidence for recurrence of tumor. No mediastinal or hilar adenopathy  identified. There is no axillary or supraclavicular adenopathy.  CT ABDOMEN AND PELVIS FINDINGS  There is no suspicious liver abnormality. Stones identified within  the  neck of the gallbladder. No biliary dilatation. Normal  appearance of the pancreas and spleen.  The adrenal glands are both normal. The kidneys are both  unremarkable. The urinary bladder is normal. The uterus and adnexal  structures both appear normal.  Normal caliber of the abdominal aorta. No aneurysm. No upper  abdominal adenopathy. There is no pelvic or inguinal adenopathy.  Small hiatal hernia noted. The small bowel loops have a normal  course and caliber and there is no evidence for bowel obstruction.  Normal appearance of the colon  Review of the visualized bony structures is significant for mild  thoracic spondylosis.  IMPRESSION:  1.  No acute findings and no evidence for residual or recurrence of  tumor.  2. No evidence for distant metastatic disease.  3. Pulmonary nodule in the left lower lobe is unchanged measuring 4  mm.  4. Gallstones.    Impression and Plan:  48 year old woman with the following issues:   1. 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 Her pathological staging was T2 N0. She did have a focally involved margin and radiation was deferred for salvage purposes. CT scan from 09/11/1998 team were discussed and showed no evidence of any tumor recurrence at this time. The plan is continued active surveillance and followup in 6 months.  2. A microcytosis: likely due to mild iron deficiency anemia. She is currently on iron supplements and her hemoglobin is normal at this time.  3. Fluid retention: Likely related to dilated cardiomyopathy with a 25%. She is currently on diuretics and followed by cardiology.   [facility], MD 9/2/20153:29 PM

## 2013-09-13 ENCOUNTER — Ambulatory Visit: Payer: Medicaid Other | Admitting: Cardiology

## 2013-09-20 ENCOUNTER — Telehealth: Payer: Self-pay | Admitting: Cardiology

## 2013-09-20 NOTE — Telephone Encounter (Signed)
Cancel phone note--taken in error

## 2013-09-20 NOTE — Telephone Encounter (Signed)
[employer] requested additional information.

## 2013-09-21 ENCOUNTER — Other Ambulatory Visit: Payer: Self-pay | Admitting: Oncology

## 2013-09-25 ENCOUNTER — Encounter: Payer: Self-pay | Admitting: *Deleted

## 2013-09-25 ENCOUNTER — Other Ambulatory Visit: Payer: Self-pay | Admitting: *Deleted

## 2013-09-25 MED ORDER — FERROUS SULFATE 325 (65 FE) MG PO TBEC: 325.0000 mg | DELAYED_RELEASE_TABLET | Freq: Every day | ORAL | Status: DC

## 2013-09-25 NOTE — Progress Notes (Signed)
Patient calling to say pharmacy would not refill her ferrous sulfate. Refilled feso4  325 mg with 3 refills. Patient aware.

## 2013-10-10 ENCOUNTER — Other Ambulatory Visit: Payer: Self-pay | Admitting: Cardiology

## 2013-10-10 MED ORDER — LISINOPRIL 10 MG PO TABS: 10.0000 mg | ORAL_TABLET | Freq: Two times a day (BID) | ORAL | Status: DC

## 2013-10-12 ENCOUNTER — Telehealth: Payer: Self-pay | Admitting: Cardiology

## 2013-10-12 NOTE — Telephone Encounter (Signed)
LM for patient that medication was refilled on 10/10/13

## 2013-10-12 NOTE — Telephone Encounter (Signed)
Pt still have not received her Lisinopril. The pharmacist said she did not have anything on it. She needs her Lisinopril 10 mg 2x a day #60. Please call this today.

## 2013-10-29 ENCOUNTER — Other Ambulatory Visit: Payer: Self-pay

## 2013-10-29 MED ORDER — POTASSIUM CHLORIDE CRYS ER 20 MEQ PO TBCR: 20.0000 meq | EXTENDED_RELEASE_TABLET | Freq: Every day | ORAL | Status: DC

## 2013-11-02 ENCOUNTER — Telehealth: Payer: Self-pay | Admitting: Cardiology

## 2013-11-02 NOTE — Telephone Encounter (Signed)
I have talked to he lab about this pt. And given them the ICD 10 code

## 2013-11-03 LAB — THYROID PANEL WITH TSH
Free Thyroxine Index: 2.4 (ref 1.4–3.8)
T3 UPTAKE: 28 % (ref 22.0–35.0)
T4 TOTAL: 8.6 ug/dL (ref 4.5–12.0)
TSH: 0.85 u[IU]/mL (ref 0.350–4.500)

## 2013-11-13 ENCOUNTER — Telehealth (HOSPITAL_COMMUNITY): Payer: Self-pay | Admitting: *Deleted

## 2013-11-13 NOTE — Telephone Encounter (Signed)
Pt states that she thought Dr. Percival Spanish was going to schedule a test for her after she had her blood test. She thought she was supposed to have an MRI or Echocardiogram.  Please Advise

## 2013-11-14 NOTE — Telephone Encounter (Signed)
Follow up          Forwarding to Dr. Percival Spanish scheduler Jari Sportsman

## 2013-11-14 NOTE — Telephone Encounter (Signed)
Spoke with patient - per AVS, patient was supposed to f/up after lab work.   Routed message to [place] department to set up f/up with Dr. Percival Spanish

## 2013-11-30 ENCOUNTER — Other Ambulatory Visit: Payer: Self-pay

## 2013-11-30 MED ORDER — CARVEDILOL 25 MG PO TABS: 25.0000 mg | ORAL_TABLET | Freq: Two times a day (BID) | ORAL | Status: DC

## 2013-11-30 MED ORDER — LISINOPRIL 10 MG PO TABS: 10.0000 mg | ORAL_TABLET | Freq: Two times a day (BID) | ORAL | Status: DC

## 2013-11-30 MED ORDER — FUROSEMIDE 40 MG PO TABS: 40.0000 mg | ORAL_TABLET | Freq: Two times a day (BID) | ORAL | Status: DC

## 2013-12-20 ENCOUNTER — Encounter (HOSPITAL_COMMUNITY): Payer: Self-pay | Admitting: Cardiovascular Disease

## 2013-12-31 ENCOUNTER — Ambulatory Visit (INDEPENDENT_AMBULATORY_CARE_PROVIDER_SITE_OTHER): Payer: BC Managed Care – PPO | Admitting: Cardiology

## 2013-12-31 ENCOUNTER — Encounter: Payer: Self-pay | Admitting: Cardiology

## 2013-12-31 VITALS — BP 104/72 | HR 93 | Ht 69.0 in | Wt 364.2 lb

## 2013-12-31 DIAGNOSIS — I10 Essential (primary) hypertension: Secondary | ICD-10-CM

## 2013-12-31 DIAGNOSIS — I420 Dilated cardiomyopathy: Secondary | ICD-10-CM

## 2013-12-31 NOTE — Patient Instructions (Addendum)
Your physician recommends that you schedule a follow-up appointment in: as needed with Dr. Percival Spanish  We are ordering an Echo for you to get done

## 2013-12-31 NOTE — Progress Notes (Signed)
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  Perfusion imaging suggested a defect in the anteroseptal wall. However, cardiac catheterization demonstrated normal coronaries.   We have titrated her meds.  She continues to have fatigue and shortness of breath which is unchanged from previous. Her weight has been creeping up.  She's not having any new PND or orthopnea. She's been most troubled by fluctuating blood pressures. This has been interfering with her sleep.  She thinks that it has been the cause of her failure to lose weight.    Allergies  Allergen Reactions  . Amoxicillin-Pot Clavulanate Diarrhea    Current Outpatient Prescriptions  Medication Sig Dispense Refill  . acetaminophen (TYLENOL) 500 MG tablet Take 1,000 mg by mouth daily as needed (pain).     Marland Kitchen albuterol (PROVENTIL HFA;VENTOLIN HFA) 108 (90 BASE) MCG/ACT inhaler Inhale 1-2 puffs into the lungs every 6 (six) hours as needed for wheezing or shortness of breath.    Marland Kitchen aspirin EC 325 MG tablet Take 325 mg by mouth daily.    . carvedilol (COREG) 25 MG tablet Take 1 tablet (25 mg total) by mouth 2 (two) times daily. 90 tablet 0  . cholecalciferol (VITAMIN D) 1000 UNITS tablet Take 2,000 Units by mouth daily.    . ferrous sulfate 325 (65 FE) MG EC tablet Take 1 tablet (325 mg total) by mouth daily with breakfast. 30 tablet 3  . fluticasone (FLONASE) 50 MCG/ACT nasal spray Place 1 spray into both nostrils daily as needed for allergies or rhinitis.    . furosemide (LASIX) 40 MG tablet Take 1 tablet (40 mg total) by mouth 2 (two) times daily. 90 tablet 1  . gabapentin (NEURONTIN) 300 MG capsule Take 300 mg by mouth at bedtime.     Marland Kitchen HYDROcodone-acetaminophen (NORCO/VICODIN) 5-325 MG per tablet Take 1 tablet by mouth every 6 (six) hours as needed. 7 tablet 0  . insulin glargine (LANTUS) 100 UNIT/ML injection Inject 30 Units into the skin at bedtime.    . insulin regular (NOVOLIN R,HUMULIN R) 100  units/mL injection Inject 20-30 Units into the skin 3 (three) times daily before meals. SSI    . lisinopril (PRINIVIL,ZESTRIL) 10 MG tablet Take 1 tablet (10 mg total) by mouth 2 (two) times daily. 90 tablet 0  . metFORMIN (GLUCOPHAGE) 1000 MG tablet Take 1,000 mg by mouth 2 (two) times daily with a meal.    . Multiple Vitamin (MULTIVITAMIN WITH MINERALS) TABS Take 1 tablet by mouth every morning.     . nitroGLYCERIN (NITROSTAT) 0.4 MG SL tablet Place 0.4 mg under the tongue every 5 (five) minutes as needed for chest pain.    . potassium chloride SA (K-DUR,KLOR-CON) 20 MEQ tablet Take 1 tablet (20 mEq total) by mouth daily. 30 tablet 2  . simvastatin (ZOCOR) 40 MG tablet Take 40 mg by mouth at bedtime.     No current facility-administered medications for this visit.    Past Medical History  Diagnosis Date  . Diabetes mellitus   . High cholesterol   . Leukocytosis   . Anemia   . Obesity   . Sickle cell trait   . Hypertension     takes medicine to protect kidneys, does not have HTN  . Bronchitis   . Pneumonia   . Thymoma     Past Surgical History  Procedure Laterality Date  . Tonsillectomy    . Tubal ligation    . Mediastinotomy chamberlain  mcneil Right 11/06/2012    Procedure: MEDIASTINOTOMY CHAMBERLAIN MCNEILPROCEDURE;  Surgeon: Gaye Pollack, MD;  Location: [facility] OR;  Service: Thoracic;  Laterality: Right;  . Lymph node biopsy Right 11/30/2012    Procedure: RIGHT TONSIL BIOPSY WITH FRESH FROZEN ANALYSIS;  Surgeon: Jodi Marble, MD;  Location: [place];  Service: ENT;  Laterality: Right;  . Mediasternotomy N/A 01/12/2013    Procedure: MEDIAN STERNOTOMY;  Surgeon: Gaye Pollack, MD;  Location: [facility];  Service: Thoracic;  Laterality: N/A;  . Resection of a thymoma N/A 01/12/2013    Procedure: RESECTION OF A THYMOMA;  Surgeon: Gaye Pollack, MD;  Location: [place];  Service: Thoracic;  Laterality: N/A;  . Left heart catheterization with coronary angiogram N/A 05/25/2013    Procedure:  LEFT HEART CATHETERIZATION WITH CORONARY ANGIOGRAM;  Surgeon: Troy Sine, MD;  Location: [facility] CATH LAB;  Service: Cardiovascular;  Laterality: N/A;     ROS:  As stated in the HPI and negative for all other systems.  PHYSICAL EXAM BP 104/72 mmHg  Pulse 93  Ht 5\' 9"  (1.753 m)  Wt 364 lb 3.2 oz (165.2 kg)  BMI 53.76 kg/m2  LMP 06/17/2012 GENERAL:  Well appearing HEENT:  Pupils equal round and reactive, fundi not visualized, oral mucosa unremarkable NECK:  No jugular venous distention, waveform within normal limits, carotid upstroke brisk and symmetric, no bruits, no thyromegaly LUNGS:  Clear to auscultation bilaterally CHEST:  Unremarkable HEART:  PMI not displaced or sustained,S1 and S2 within normal limits, no S3, no S4, no clicks, no rubs, no murmurs ABD:  Flat, positive bowel sounds normal in frequency in pitch, no bruits, no rebound, no guarding, no midline pulsatile mass, no hepatomegaly, no splenomegaly EXT:  2 plus pulses throughout, trace edema, no cyanosis no clubbing   ASSESSMENT AND PLAN  DILATED CARDIOMYOPATHY:    She's on a good medical regimen at this point.  Her BP will not allow med titration.  I will check an echocardiogram.  PROLONGED QT:  This was noted on a previous EKG.  She has had no palpitations, presyncope or syncope. No change in therapy is planned for this.  OBESITY:  This is contributing to her symptoms.  I had a long discussion with her about this.  She is going to refer herself to a weight loss clinic in [place].

## 2014-01-08 ENCOUNTER — Encounter (HOSPITAL_COMMUNITY): Payer: Self-pay | Admitting: Emergency Medicine

## 2014-01-08 ENCOUNTER — Emergency Department (HOSPITAL_COMMUNITY): Payer: BC Managed Care – PPO

## 2014-01-08 ENCOUNTER — Emergency Department (HOSPITAL_COMMUNITY)
Admission: EM | Admit: 2014-01-08 | Discharge: 2014-01-09 | Disposition: A | Discharge status: Home / Self Care | Payer: BC Managed Care – PPO | Attending: Emergency Medicine | Admitting: Emergency Medicine

## 2014-01-08 DIAGNOSIS — D573 Sickle-cell trait: Secondary | ICD-10-CM | POA: Diagnosis not present

## 2014-01-08 DIAGNOSIS — I10 Essential (primary) hypertension: Secondary | ICD-10-CM | POA: Diagnosis not present

## 2014-01-08 DIAGNOSIS — Z9851 Tubal ligation status: Secondary | ICD-10-CM | POA: Insufficient documentation

## 2014-01-08 DIAGNOSIS — R5383 Other fatigue: Secondary | ICD-10-CM | POA: Insufficient documentation

## 2014-01-08 DIAGNOSIS — D649 Anemia, unspecified: Secondary | ICD-10-CM | POA: Insufficient documentation

## 2014-01-08 DIAGNOSIS — R079 Chest pain, unspecified: Secondary | ICD-10-CM

## 2014-01-08 DIAGNOSIS — Z8709 Personal history of other diseases of the respiratory system: Secondary | ICD-10-CM | POA: Insufficient documentation

## 2014-01-08 DIAGNOSIS — Z8701 Personal history of pneumonia (recurrent): Secondary | ICD-10-CM | POA: Diagnosis not present

## 2014-01-08 DIAGNOSIS — R0789 Other chest pain: Secondary | ICD-10-CM

## 2014-01-08 DIAGNOSIS — E119 Type 2 diabetes mellitus without complications: Secondary | ICD-10-CM | POA: Diagnosis not present

## 2014-01-08 DIAGNOSIS — D72829 Elevated white blood cell count, unspecified: Secondary | ICD-10-CM | POA: Diagnosis not present

## 2014-01-08 DIAGNOSIS — Z79899 Other long term (current) drug therapy: Secondary | ICD-10-CM | POA: Insufficient documentation

## 2014-01-08 DIAGNOSIS — E669 Obesity, unspecified: Secondary | ICD-10-CM | POA: Diagnosis not present

## 2014-01-08 DIAGNOSIS — R1011 Right upper quadrant pain: Secondary | ICD-10-CM | POA: Diagnosis not present

## 2014-01-08 DIAGNOSIS — Z880 Allergy status to penicillin: Secondary | ICD-10-CM | POA: Insufficient documentation

## 2014-01-08 DIAGNOSIS — Z7982 Long term (current) use of aspirin: Secondary | ICD-10-CM | POA: Diagnosis not present

## 2014-01-08 DIAGNOSIS — E780 Pure hypercholesterolemia: Secondary | ICD-10-CM | POA: Diagnosis not present

## 2014-01-08 LAB — BASIC METABOLIC PANEL
ANION GAP: 8 (ref 5–15)
BUN: 25 mg/dL — AB (ref 6–23)
CHLORIDE: 109 meq/L (ref 96–112)
CO2: 27 mmol/L (ref 19–32)
CREATININE: 0.97 mg/dL (ref 0.50–1.10)
Calcium: 9.9 mg/dL (ref 8.4–10.5)
GFR calc Af Amer: 80 mL/min — ABNORMAL LOW (ref >90)
GFR, EST NON AFRICAN AMERICAN: 69 mL/min — AB (ref >90)
Glucose, Bld: 147 mg/dL — ABNORMAL HIGH (ref 70–99)
Potassium: 4.4 mmol/L (ref 3.5–5.1)
Sodium: 144 mmol/L (ref 135–145)

## 2014-01-08 LAB — CBC
HEMATOCRIT: 37.6 % (ref 36.0–46.0)
Hemoglobin: 12.3 g/dL (ref 12.0–15.0)
MCH: 25 pg — AB (ref 26.0–34.0)
MCHC: 32.7 g/dL (ref 30.0–36.0)
MCV: 76.4 fL — AB (ref 78.0–100.0)
PLATELETS: 311 10*3/uL (ref 150–400)
RBC: 4.92 MIL/uL (ref 3.87–5.11)
RDW: 16.4 % — AB (ref 11.5–15.5)
WBC: 12.9 10*3/uL — AB (ref 4.0–10.5)

## 2014-01-08 LAB — BRAIN NATRIURETIC PEPTIDE: B Natriuretic Peptide: 22.7 pg/mL (ref 0.0–100.0)

## 2014-01-08 LAB — I-STAT TROPONIN, ED: Troponin i, poc: 0 ng/mL (ref 0.00–0.08)

## 2014-01-08 MED ORDER — NITROGLYCERIN 0.4 MG SL SUBL
0.4000 mg | SUBLINGUAL_TABLET | SUBLINGUAL | Status: DC | PRN
  Administered 2014-01-09: 0.4 mg via SUBLINGUAL
  Filled 2014-01-08: qty 1

## 2014-01-08 NOTE — ED Notes (Signed)
Pt states she has had chest pain and pressure in her central chest since Sunday. Pt also c/o some slight shortness of breath, some lightheadedness, diaphoresis, and back pain. Denies any n/v, dizziness or weakness.

## 2014-01-08 NOTE — ED Notes (Signed)
Delay in lab draw, pt xfered to xray

## 2014-01-09 ENCOUNTER — Ambulatory Visit (HOSPITAL_COMMUNITY)
Admission: RE | Admit: 2014-01-09 | Discharge: 2014-01-09 | Disposition: A | Discharge status: Home / Self Care | Payer: BC Managed Care – PPO | Source: Ambulatory Visit | Attending: Cardiology | Admitting: Cardiology

## 2014-01-09 ENCOUNTER — Emergency Department (HOSPITAL_COMMUNITY): Payer: BC Managed Care – PPO

## 2014-01-09 DIAGNOSIS — I429 Cardiomyopathy, unspecified: Secondary | ICD-10-CM | POA: Insufficient documentation

## 2014-01-09 DIAGNOSIS — E119 Type 2 diabetes mellitus without complications: Secondary | ICD-10-CM | POA: Diagnosis not present

## 2014-01-09 DIAGNOSIS — I420 Dilated cardiomyopathy: Secondary | ICD-10-CM

## 2014-01-09 DIAGNOSIS — I10 Essential (primary) hypertension: Secondary | ICD-10-CM | POA: Insufficient documentation

## 2014-01-09 DIAGNOSIS — E785 Hyperlipidemia, unspecified: Secondary | ICD-10-CM | POA: Insufficient documentation

## 2014-01-09 DIAGNOSIS — I519 Heart disease, unspecified: Secondary | ICD-10-CM

## 2014-01-09 LAB — D-DIMER, QUANTITATIVE (NOT AT ARMC)

## 2014-01-09 LAB — HEPATIC FUNCTION PANEL
ALT: 18 U/L (ref 0–35)
AST: 19 U/L (ref 0–37)
Albumin: 4.1 g/dL (ref 3.5–5.2)
Alkaline Phosphatase: 93 U/L (ref 39–117)
Bilirubin, Direct: <0.1 mg/dL (ref 0.0–0.3)
Total Bilirubin: 0.4 mg/dL (ref 0.3–1.2)
Total Protein: 7.1 g/dL (ref 6.0–8.3)

## 2014-01-09 LAB — CBG MONITORING, ED: Glucose-Capillary: 98 mg/dL (ref 70–99)

## 2014-01-09 MED ORDER — ONDANSETRON HCL 4 MG/2ML IJ SOLN
4.0000 mg | INTRAMUSCULAR | Status: AC
  Administered 2014-01-09: 4 mg via INTRAVENOUS
  Filled 2014-01-09: qty 2

## 2014-01-09 MED ORDER — NAPROXEN 500 MG PO TABS: 500.0000 mg | ORAL_TABLET | Freq: Two times a day (BID) | ORAL | Status: DC

## 2014-01-09 MED ORDER — HYDROCODONE-ACETAMINOPHEN 5-325 MG PO TABS: 1.0000 | ORAL_TABLET | ORAL | Status: DC | PRN

## 2014-01-09 MED ORDER — MORPHINE SULFATE 4 MG/ML IJ SOLN
4.0000 mg | Freq: Once | INTRAMUSCULAR | Status: AC
  Administered 2014-01-09: 4 mg via INTRAVENOUS
  Filled 2014-01-09: qty 1

## 2014-01-09 NOTE — ED Notes (Signed)
RN informed this Probation officer labs will be drawn with IV start.

## 2014-01-09 NOTE — ED Notes (Signed)
Patient transported to Ultrasound 

## 2014-01-09 NOTE — Progress Notes (Signed)
2D Echocardiogram Complete.  01/09/2014   Jamaia Brum [place], [place]

## 2014-01-09 NOTE — Discharge Instructions (Signed)
These follow the directions provided. Be sure to follow-up with your primary care doctor to ensure you're getting better. Please take the naproxen twice a day for pain and inflammation. You may take the Vicodin for pain that is not relieved by the naproxen. Don't hesitate to return for any new, worsening, or concerning symptoms.  SEEK IMMEDIATE MEDICAL CARE IF:  Your pain increases, or you are very uncomfortable.  You have a fever.  Your chest pain becomes worse.  You have new, unexplained symptoms.  You have nausea or vomiting.  You feel sweaty or lightheaded.  You have a cough with phlegm (sputum), or you cough up blood.   SEEK IMMEDIATE MEDICAL CARE IF:  Your pain increases and is not controlled by medicines.  You have a fever or persistent symptoms for more than 2-3 days.  You have a fever and your symptoms suddenly get worse.  You have persistent nausea and vomiting.

## 2014-01-09 NOTE — ED Provider Notes (Signed)
CSN: [id number]     Arrival date & time 01/08/14  2043 History   First MD Initiated Contact with Patient 01/09/14 0032     Chief Complaint  Patient presents with  . Chest Pain   (Consider location/radiation/quality/duration/timing/severity/associated sxs/prior Treatment) HPI Kaitlin [name] is a 48 year old female presenting with chest pain 2 days. She reports she noticed chest heaviness and also pleuritic type chest pain Sunday morning when she was getting ready for church the pain seemed to be constant and did not change with exertion. It did seem to worsen when she was taking a deep breath. The pain is in her right anterior chest wall but also seems to radiate into her right upper quadrant and behind her right shoulder blade. She denies any fevers, chills, nausea, vomiting, diarrhea, hemoptysis, unilateral leg swelling or pain, estrogen use or recent surgeries.  Past Medical History  Diagnosis Date  . Diabetes mellitus   . High cholesterol   . Leukocytosis   . Anemia   . Obesity   . Sickle cell trait   . Hypertension     takes medicine to protect kidneys, does not have HTN  . Bronchitis   . Pneumonia   . Thymoma    Past Surgical History  Procedure Laterality Date  . Tonsillectomy    . Tubal ligation    . Mediastinotomy chamberlain mcneil Right 11/06/2012    Procedure: MEDIASTINOTOMY CHAMBERLAIN MCNEILPROCEDURE;  Surgeon: Gaye Pollack, MD;  Location: [place];  Service: Thoracic;  Laterality: Right;  . Lymph node biopsy Right 11/30/2012    Procedure: RIGHT TONSIL BIOPSY WITH FRESH FROZEN ANALYSIS;  Surgeon: Jodi Marble, MD;  Location: [place];  Service: ENT;  Laterality: Right;  . Mediasternotomy N/A 01/12/2013    Procedure: MEDIAN STERNOTOMY;  Surgeon: Gaye Pollack, MD;  Location: [place];  Service: Thoracic;  Laterality: N/A;  . Resection of a thymoma N/A 01/12/2013    Procedure: RESECTION OF A THYMOMA;  Surgeon: Gaye Pollack, MD;  Location: [place];  Service: Thoracic;   Laterality: N/A;  . Left heart catheterization with coronary angiogram N/A 05/25/2013    Procedure: LEFT HEART CATHETERIZATION WITH CORONARY ANGIOGRAM;  Surgeon: Troy Sine, MD;  Location: [facility] CATH LAB;  Service: Cardiovascular;  Laterality: N/A;   Family History  Problem Relation Age of Onset  . Heart disease Father     No details  . Diabetes type II      Family HX   History  Substance Use Topics  . Smoking status: Passive Smoke Exposure - Never Smoker  . Smokeless tobacco: Never Used  . Alcohol Use: No   OB History    No data available     Review of Systems  Constitutional: Positive for fatigue. Negative for fever and chills.  HENT: Negative for sore throat.   Eyes: Negative for visual disturbance.  Respiratory: Negative for cough and shortness of breath.   Cardiovascular: Positive for chest pain. Negative for leg swelling.  Gastrointestinal: Negative for nausea, vomiting and diarrhea.  Genitourinary: Negative for dysuria.  Musculoskeletal: Negative for myalgias.  Skin: Negative for rash.  Neurological: Negative for weakness, numbness and headaches.    Allergies  Amoxicillin-pot clavulanate  Home Medications   Prior to Admission medications   Medication Sig Start Date End Date Taking? Authorizing Provider  acetaminophen (TYLENOL) 500 MG tablet Take 1,000 mg by mouth daily as needed (pain).    Yes Historical Provider, MD  albuterol (PROVENTIL HFA;VENTOLIN HFA) 108 (90 BASE) MCG/ACT  inhaler Inhale 1-2 puffs into the lungs every 6 (six) hours as needed for wheezing or shortness of breath.   Yes Historical Provider, MD  aspirin EC 325 MG tablet Take 325 mg by mouth daily.   Yes Historical Provider, MD  carvedilol (COREG) 25 MG tablet Take 1 tablet (25 mg total) by mouth 2 (two) times daily. 11/30/13  Yes Minus Breeding, MD  cholecalciferol (VITAMIN D) 1000 UNITS tablet Take 2,000 Units by mouth daily.   Yes Historical Provider, MD  ferrous sulfate 325 (65 FE) MG EC  tablet Take 1 tablet (325 mg total) by mouth daily with breakfast. 09/25/13  Yes Wyatt Portela, MD  fluticasone (FLONASE) 50 MCG/ACT nasal spray Place 1 spray into both nostrils daily as needed for allergies or rhinitis.   Yes Historical Provider, MD  furosemide (LASIX) 40 MG tablet Take 1 tablet (40 mg total) by mouth 2 (two) times daily. 11/30/13  Yes Minus Breeding, MD  ibuprofen (ADVIL,MOTRIN) 200 MG tablet Take 400 mg by mouth every 6 (six) hours as needed.   Yes Historical Provider, MD  LEVEMIR 100 UNIT/ML injection Inject 30 Units into the skin 2 (two) times daily. 11/23/13  Yes Historical Provider, MD  lisinopril (PRINIVIL,ZESTRIL) 10 MG tablet Take 1 tablet (10 mg total) by mouth 2 (two) times daily. 11/30/13  Yes Minus Breeding, MD  metFORMIN (GLUCOPHAGE) 1000 MG tablet Take 1,000 mg by mouth 2 (two) times daily with a meal.   Yes Historical Provider, MD  Multiple Vitamin (MULTIVITAMIN WITH MINERALS) TABS Take 1 tablet by mouth every morning.    Yes Historical Provider, MD  NOVOLOG 100 UNIT/ML injection Inject 30 Units into the skin 6 (six) times daily. 01/02/14  Yes Historical Provider, MD  potassium chloride SA (K-DUR,KLOR-CON) 20 MEQ tablet Take 1 tablet (20 mEq total) by mouth daily. 10/29/13  Yes Minus Breeding, MD  simvastatin (ZOCOR) 40 MG tablet Take 40 mg by mouth at bedtime.   Yes Historical Provider, MD  HYDROcodone-acetaminophen (NORCO/VICODIN) 5-325 MG per tablet Take 1 tablet by mouth every 6 (six) hours as needed. Patient not taking: Reported on 01/08/2014 07/08/13   Bonnita Hollow, MD   BP 116/53 mmHg  Pulse 93  Temp(Src) 98.4 F (36.9 C) (Oral)  Resp 18  Ht 5\' 9"  (1.753 m)  Wt 364 lb 6.4 oz (165.291 kg)  BMI 53.79 kg/m2  SpO2 98%  LMP 06/17/2012 Physical Exam  Constitutional: She appears well-developed and well-nourished. No distress.  HENT:  Head: Normocephalic and atraumatic.  Mouth/Throat: Oropharynx is clear and moist. No oropharyngeal exudate.  Eyes:  Conjunctivae are normal.  Neck: Neck supple. No thyromegaly present.  Cardiovascular: Normal rate, regular rhythm and intact distal pulses.   Pulmonary/Chest: Effort normal and breath sounds normal. No respiratory distress. She has no decreased breath sounds. She has no wheezes. She has no rhonchi. She has no rales. She exhibits tenderness.    Abdominal: Soft. There is tenderness. There is positive Murphy's sign. There is no rigidity, no guarding and no tenderness at McBurney's point.    Musculoskeletal: She exhibits no tenderness.  Lymphadenopathy:    She has no cervical adenopathy.  Neurological: She is alert.  Skin: Skin is warm and dry. No rash noted. She is not diaphoretic.  Psychiatric: She has a normal mood and affect.  Nursing note and vitals reviewed.   ED Course  Procedures (including critical care time) Labs Review Labs Reviewed  CBC - Abnormal; Notable for the following:    WBC 12.9 (*)  MCV 76.4 (*)    MCH 25.0 (*)    RDW 16.4 (*)    All other components within normal limits  BASIC METABOLIC PANEL - Abnormal; Notable for the following:    Glucose, Bld 147 (*)    BUN 25 (*)    GFR calc non Af Amer 69 (*)    GFR calc Af Amer 80 (*)    All other components within normal limits  BRAIN NATRIURETIC PEPTIDE  I-STAT TROPOININ, ED    Imaging Review Dg Chest 2 View  01/08/2014   CLINICAL DATA:  Right anterior chest pain, shortness of breath  EXAM: CHEST  2 VIEW  COMPARISON:  CTA chest dated 09/10/2013  FINDINGS: Mild linear scarring/atelectasis at the lung bases. No focal consolidation. No pleural effusion or pneumothorax.  Heart is normal in size.  Median sternotomy.  Degenerative changes of the visualized thoracolumbar spine.  IMPRESSION: No evidence of acute cardiopulmonary disease.   Electronically Signed   By: Julian Hy M.D.   On: 01/08/2014 21:34     EKG Interpretation None      MDM   Final diagnoses:  RUQ pain  Chest wall pain   48 yo with  chest wall pain is to be discharged with recommendation to follow up with PCP in regards to today's hospital visit. Her chest pain is unlikely due to cardiac or pulmonary etiology based on history of the pain and her physical exam. Her vitals are stable, she has no tracheal deviation, no JVD or new murmur, RRR, breath sounds equal bilaterally, EKG without acute abnormalities, negative troponin, negative d-dimer and negative CXR. On exam she did have a RUQ TTP and an [place] was performed which showed cholelithiasis without cholecystitis. Pt's pain was improved in the ED. Discussed US findings and pt reports association of pain after eating.  Pt provided anti-inflammatory meds and discussed dietary modifications for gall bladder. Advised pt to follow-up with PCP. Return precautions include if CP becomes exertional, associated with diaphoresis or nausea, radiates to left jaw/arm, worsens or becomes concerning in any way. Pt is well-appearing, in no acute distress and vital signs are stable.  They appear safe to be discharged.   Pt appears reliable for follow up and is agreeable to discharge.   Case has been discussed with and seen by Dr. Randal Buba who agrees with the above plan to discharge.    Filed Vitals:   01/08/14 2105 01/09/14 0446  BP: 116/53 111/63  Pulse: 93 89  Temp: 98.4 F (36.9 C) 98 F (36.7 C)  TempSrc: Oral Oral  Resp: 18 18  Height: 5\' 9"  (1.753 m)   Weight: 364 lb 6.4 oz (165.291 kg)   SpO2: 98% 97%   Meds given in ED:  Medications  morphine 4 MG/ML injection 4 mg (4 mg Intravenous Given 01/09/14 0258)  ondansetron (ZOFRAN) injection 4 mg (4 mg Intravenous Given 01/09/14 0259)    Discharge Medication List as of 01/09/2014  4:44 AM    START taking these medications   Details  naproxen (NAPROSYN) 500 MG tablet Take 1 tablet (500 mg total) by mouth 2 (two) times daily., Starting 01/09/2014, Until Discontinued, Print           Britt Bottom, NP 01/09/14 1531  April K  Palumbo-Rasch, MD 01/12/14 [phone]

## 2014-01-15 ENCOUNTER — Telehealth: Payer: Self-pay | Admitting: Cardiology

## 2014-01-15 MED ORDER — LISINOPRIL 10 MG PO TABS: 10.0000 mg | ORAL_TABLET | Freq: Two times a day (BID) | ORAL | Status: DC

## 2014-01-15 NOTE — Telephone Encounter (Signed)
Pt called in stating that she has an Echo done on 12/30 and she would like to know that the results were and she also stated that her prescription for her Lisinopril has expired and needs a new one [facility]. Please call  thanks

## 2014-01-15 NOTE — Telephone Encounter (Addendum)
Told pt have not seen read report from [place]. Med refill done, no other concerns.

## 2014-02-07 ENCOUNTER — Other Ambulatory Visit: Payer: Self-pay | Admitting: Cardiology

## 2014-02-07 NOTE — Telephone Encounter (Signed)
Rx has been sent to the pharmacy electronically. ° °

## 2014-02-18 DIAGNOSIS — Z794 Long term (current) use of insulin: Secondary | ICD-10-CM | POA: Insufficient documentation

## 2014-02-18 DIAGNOSIS — Z9641 Presence of insulin pump (external) (internal): Secondary | ICD-10-CM | POA: Insufficient documentation

## 2014-02-18 DIAGNOSIS — Z9289 Personal history of other medical treatment: Secondary | ICD-10-CM | POA: Insufficient documentation

## 2014-03-08 DIAGNOSIS — Z4681 Encounter for fitting and adjustment of insulin pump: Secondary | ICD-10-CM | POA: Insufficient documentation

## 2014-03-08 DIAGNOSIS — Z452 Encounter for adjustment and management of vascular access device: Secondary | ICD-10-CM | POA: Insufficient documentation

## 2014-03-08 DIAGNOSIS — R69 Illness, unspecified: Secondary | ICD-10-CM | POA: Insufficient documentation

## 2014-03-15 ENCOUNTER — Telehealth: Payer: Self-pay | Admitting: Oncology

## 2014-03-15 ENCOUNTER — Other Ambulatory Visit: Payer: BC Managed Care – PPO

## 2014-03-15 ENCOUNTER — Ambulatory Visit: Payer: BC Managed Care – PPO | Admitting: Oncology

## 2014-03-15 NOTE — Telephone Encounter (Signed)
Pt lft msg to r/s she was sick, lft msg for pt confirming labs/ov r/s and mailed schedule to pt... KJ

## 2014-04-10 ENCOUNTER — Other Ambulatory Visit: Payer: Self-pay

## 2014-04-10 ENCOUNTER — Ambulatory Visit: Payer: Self-pay | Admitting: Oncology

## 2014-04-10 DIAGNOSIS — Z0271 Encounter for disability determination: Secondary | ICD-10-CM

## 2014-05-08 ENCOUNTER — Telehealth: Payer: Self-pay | Admitting: Oncology

## 2014-05-08 ENCOUNTER — Ambulatory Visit (HOSPITAL_BASED_OUTPATIENT_CLINIC_OR_DEPARTMENT_OTHER): Payer: BLUE CROSS/BLUE SHIELD | Admitting: Oncology

## 2014-05-08 ENCOUNTER — Other Ambulatory Visit (HOSPITAL_BASED_OUTPATIENT_CLINIC_OR_DEPARTMENT_OTHER): Payer: BLUE CROSS/BLUE SHIELD

## 2014-05-08 VITALS — BP 144/74 | HR 75 | Temp 97.5°F | Resp 18 | Ht 69.0 in | Wt 375.4 lb

## 2014-05-08 DIAGNOSIS — D150 Benign neoplasm of thymus: Principal | ICD-10-CM

## 2014-05-08 DIAGNOSIS — C37 Malignant neoplasm of thymus: Secondary | ICD-10-CM | POA: Diagnosis not present

## 2014-05-08 DIAGNOSIS — D4989 Neoplasm of unspecified behavior of other specified sites: Secondary | ICD-10-CM

## 2014-05-08 DIAGNOSIS — R718 Other abnormality of red blood cells: Secondary | ICD-10-CM | POA: Diagnosis not present

## 2014-05-08 LAB — COMPREHENSIVE METABOLIC PANEL (CC13)
ALT: 13 U/L (ref 0–55)
ANION GAP: 11 meq/L (ref 3–11)
AST: 11 U/L (ref 5–34)
Albumin: 3.4 g/dL — ABNORMAL LOW (ref 3.5–5.0)
Alkaline Phosphatase: 96 U/L (ref 40–150)
BUN: 24.7 mg/dL (ref 7.0–26.0)
CO2: 25 mEq/L (ref 22–29)
Calcium: 9.7 mg/dL (ref 8.4–10.4)
Chloride: 108 mEq/L (ref 98–109)
Creatinine: 1.3 mg/dL — ABNORMAL HIGH (ref 0.6–1.1)
EGFR: 58 mL/min/{1.73_m2} — ABNORMAL LOW (ref >90)
GLUCOSE: 102 mg/dL (ref 70–140)
Potassium: 4.5 mEq/L (ref 3.5–5.1)
SODIUM: 144 meq/L (ref 136–145)
TOTAL PROTEIN: 6.5 g/dL (ref 6.4–8.3)
Total Bilirubin: 0.21 mg/dL (ref 0.20–1.20)

## 2014-05-08 LAB — CBC WITH DIFFERENTIAL/PLATELET
BASO%: 0.4 % (ref 0.0–2.0)
Basophils Absolute: 0 10*3/uL (ref 0.0–0.1)
EOS%: 0 % (ref 0.0–7.0)
Eosinophils Absolute: 0 10*3/uL (ref 0.0–0.5)
HCT: 37.7 % (ref 34.8–46.6)
HGB: 11.8 g/dL (ref 11.6–15.9)
LYMPH%: 45.4 % (ref 14.0–49.7)
MCH: 23.3 pg — AB (ref 25.1–34.0)
MCHC: 31.2 g/dL — ABNORMAL LOW (ref 31.5–36.0)
MCV: 74.6 fL — AB (ref 79.5–101.0)
MONO#: 0.7 10*3/uL (ref 0.1–0.9)
MONO%: 5.4 % (ref 0.0–14.0)
NEUT%: 48.8 % (ref 38.4–76.8)
NEUTROS ABS: 6 10*3/uL (ref 1.5–6.5)
PLATELETS: 312 10*3/uL (ref 145–400)
RBC: 5.06 10*6/uL (ref 3.70–5.45)
RDW: 17.7 % — ABNORMAL HIGH (ref 11.2–14.5)
WBC: 12.3 10*3/uL — ABNORMAL HIGH (ref 3.9–10.3)
lymph#: 5.6 10*3/uL — ABNORMAL HIGH (ref 0.9–3.3)

## 2014-05-08 NOTE — Telephone Encounter (Signed)
Gave avs & calendar for August/September °

## 2014-05-08 NOTE — Progress Notes (Signed)
Hematology and Oncology Follow Up Visit  Kaitlin [name] [id number] 07-01-1965 49 y.o. 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 She presented with a large mediastinal mass and shortness of breath.  Past therapy: She is status post median sternotomy with resection of the thymoma on 01/12/13.   Current therapy: Observation and surveillance.  Interim History: Kaitlin [name] presents today for a follow up visit.  Since her last visit, she reports doing very well. Her activity level seems to be improving and has not reported any chest pain or discomfort. She is no longer reporting difficulty breathing or decline in her activities. She continues to be on iron supplements without any intolerance or dyspepsia. She is not reporting any headaches or blurry vision or syncope. She does not report any palpitation or orthopnea. She does not report any nausea or vomiting or abdominal pain. She has not reported any fevers or chills or sweats. Her rest or view of system is unremarkable.  Medications: I have reviewed the patient's current medications.  Current Outpatient Prescriptions  Medication Sig Dispense Refill  . acetaminophen (TYLENOL) 500 MG tablet Take 1,000 mg by mouth daily as needed (pain).     Marland Kitchen albuterol (PROVENTIL HFA;VENTOLIN HFA) 108 (90 BASE) MCG/ACT inhaler Inhale 1-2 puffs into the lungs every 6 (six) hours as needed for wheezing or shortness of breath.    Marland Kitchen aspirin EC 325 MG tablet Take 325 mg by mouth daily.    . carvedilol (COREG) 25 MG tablet TAKE 1 BY MOUTH TWICE DAILY 90 tablet 2  . cholecalciferol (VITAMIN D) 1000 UNITS tablet Take 2,000 Units by mouth daily.    . ferrous sulfate 325 (65 FE) MG EC tablet Take 1 tablet (325 mg total) by mouth daily with breakfast. 30 tablet 3  . fluticasone (FLONASE) 50 MCG/ACT nasal spray Place 1 spray into both nostrils daily as needed for allergies or rhinitis.    . furosemide (LASIX) 40 MG  tablet Take 1 tablet (40 mg total) by mouth 2 (two) times daily. 90 tablet 1  . HYDROcodone-acetaminophen (NORCO/VICODIN) 5-325 MG per tablet Take 1-2 tablets by mouth every 4 (four) hours as needed for moderate pain or severe pain. 6 tablet 0  . ibuprofen (ADVIL,MOTRIN) 200 MG tablet Take 400 mg by mouth every 6 (six) hours as needed.    Marland Kitchen lisinopril (PRINIVIL,ZESTRIL) 10 MG tablet Take 1 tablet (10 mg total) by mouth 2 (two) times daily. 180 tablet 3  . metFORMIN (GLUCOPHAGE) 1000 MG tablet Take 1,000 mg by mouth 2 (two) times daily with a meal.    . Multiple Vitamin (MULTIVITAMIN WITH MINERALS) TABS Take 1 tablet by mouth every morning.     Marland Kitchen NOVOLOG 100 UNIT/ML injection Inject 30 Units into the skin 6 (six) times daily.  0  . potassium chloride SA (K-DUR,KLOR-CON) 20 MEQ tablet Take 1 tablet (20 mEq total) by mouth daily. 30 tablet 2  . simvastatin (ZOCOR) 40 MG tablet Take 40 mg by mouth at bedtime.     No current facility-administered medications for this visit.    Allergies:  Allergies  Allergen Reactions  . Amoxicillin-Pot Clavulanate Diarrhea     Physical Exam: Blood pressure 144/74, pulse 75, temperature 97.5 F (36.4 C), temperature source Oral, resp. rate 18, height 5\' 9"  (1.753 m), weight 375 lb 6.4 oz (170.28 kg), last menstrual period 06/17/2012, SpO2 100 %. ECOG: 1 General appearance: alert awake not in any distress.  Head: Normocephalic, without obvious abnormality, atraumatic Neck: no adenopathy.  Lymph nodes: Cervical, supraclavicular, and axillary nodes normal. Heart:regular rate and rhythm, S1, S2 normal, no murmur, click, rub or gallop Lung:chest clear, no wheezing, rales, normal symmetric air entry Abdomin: soft, non-tender, without masses or organomegaly EXT:no erythema, induration, or nodules. 1+ edema noted.   Lab Results: Lab Results  Component Value Date   WBC 12.3* 05/08/2014   HGB 11.8 05/08/2014   HCT 37.7 05/08/2014   MCV 74.6* 05/08/2014   PLT  312 05/08/2014     Chemistry      Component Value Date/Time   NA 144 01/08/2014 2206   NA 144 09/10/2013 1048   K 4.4 01/08/2014 2206   K 4.1 09/10/2013 1048   CL 109 01/08/2014 2206   CL 108* 03/29/2012 0945   CO2 27 01/08/2014 2206   CO2 27 09/10/2013 1048   BUN 25* 01/08/2014 2206   BUN 17.1 09/10/2013 1048   CREATININE 0.97 01/08/2014 2206   CREATININE 0.9 09/10/2013 1048   CREATININE 0.85 05/30/2013 0943      Component Value Date/Time   CALCIUM 9.9 01/08/2014 2206   CALCIUM 9.3 09/10/2013 1048   ALKPHOS 93 01/09/2014 0248   ALKPHOS 90 03/29/2012 0945   AST 19 01/09/2014 0248   AST 23 03/29/2012 0945   ALT 18 01/09/2014 0248   ALT 31 03/29/2012 0945   BILITOT 0.4 01/09/2014 0248   BILITOT 0.23 03/29/2012 0945        Impression and Plan:  49 year old woman with the following issues:   1. 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 Her pathological staging was T2 N0. She did have a focally involved margin and radiation was deferred for salvage purposes. CT scan from 09/10/2013 did not show any evidence of relapse disease. The plan is to continue with active surveillance and repeat imaging studies in 4 months.  2. A microcytosis: likely due to mild iron deficiency anemia. She is currently on iron supplements and her hemoglobin is normal at this time. I will repeat her iron studies before the next visit.  3. Fluid retention: Likely related to dilated cardiomyopathy with a 25%. She is currently on diuretics and followed by cardiology. This seems to be improving at this time.   Kaitlin Button, MD 4/27/20162:55 PM

## 2014-05-15 ENCOUNTER — Telehealth: Payer: Self-pay | Admitting: Cardiology

## 2014-05-15 NOTE — Telephone Encounter (Signed)
Kaitlin [name] is calling to find out if she need to come in to see Dr. Percival Spanish prior to going to see the Disability MD. Please call   Thanks

## 2014-05-15 NOTE — Telephone Encounter (Signed)
Pt. Has appt. With Dr. Percival Spanish in June

## 2014-06-17 ENCOUNTER — Other Ambulatory Visit: Payer: Self-pay | Admitting: Cardiology

## 2014-06-18 NOTE — Telephone Encounter (Signed)
Rx(s) sent to pharmacy electronically.  

## 2014-06-20 ENCOUNTER — Ambulatory Visit (INDEPENDENT_AMBULATORY_CARE_PROVIDER_SITE_OTHER): Payer: BLUE CROSS/BLUE SHIELD | Admitting: Cardiology

## 2014-06-20 ENCOUNTER — Encounter: Payer: Self-pay | Admitting: Cardiology

## 2014-06-20 VITALS — BP 104/70 | HR 91 | Ht 69.0 in | Wt 377.3 lb

## 2014-06-20 DIAGNOSIS — I420 Dilated cardiomyopathy: Secondary | ICD-10-CM | POA: Diagnosis not present

## 2014-06-20 NOTE — Progress Notes (Signed)
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  Perfusion imaging suggested a defect in the anteroseptal wall. However, cardiac catheterization demonstrated normal coronaries.   We have titrated her meds.  She was in the hospital in December. She has some chest wall pain. She did have a repeat echocardiogram in December that demonstrated the EF was now up to 45-50%.   She's not having any new PND or orthopnea. She's had no further chest discomfort, neck or arm discomfort. Her weights have been going up.    Allergies  Allergen Reactions  . Amoxicillin-Pot Clavulanate Diarrhea    Current Outpatient Prescriptions  Medication Sig Dispense Refill  . acetaminophen (TYLENOL) 500 MG tablet Take 1,000 mg by mouth daily as needed (pain).     Marland Kitchen albuterol (PROVENTIL HFA;VENTOLIN HFA) 108 (90 BASE) MCG/ACT inhaler Inhale 1-2 puffs into the lungs every 6 (six) hours as needed for wheezing or shortness of breath.    Marland Kitchen aspirin EC 325 MG tablet Take 325 mg by mouth daily.    . carvedilol (COREG) 25 MG tablet TAKE 1 BY MOUTH TWICE DAILY 90 tablet 2  . cholecalciferol (VITAMIN D) 1000 UNITS tablet Take 2,000 Units by mouth daily.    . ferrous sulfate 325 (65 FE) MG EC tablet Take 1 tablet (325 mg total) by mouth daily with breakfast. 30 tablet 3  . fluticasone (FLONASE) 50 MCG/ACT nasal spray Place 1 spray into both nostrils daily as needed for allergies or rhinitis.    . furosemide (LASIX) 40 MG tablet Take 1 tablet (40 mg total) by mouth 2 (two) times daily. 60 tablet 3  . glucose blood (BAYER CONTOUR NEXT TEST) test strip As directed    . HYDROcodone-acetaminophen (NORCO/VICODIN) 5-325 MG per tablet Take 1-2 tablets by mouth every 4 (four) hours as needed for moderate pain or severe pain. 6 tablet 0  . ibuprofen (ADVIL,MOTRIN) 200 MG tablet Take 400 mg by mouth every 6 (six) hours as needed.    . Insulin Human (INSULIN PUMP) SOLN Inject into the skin as directed.    Marland Kitchen  lisinopril (PRINIVIL,ZESTRIL) 10 MG tablet Take 1 tablet (10 mg total) by mouth 2 (two) times daily. 180 tablet 3  . metFORMIN (GLUCOPHAGE) 1000 MG tablet Take 1,000 mg by mouth 2 (two) times daily with a meal.    . Multiple Vitamin (MULTIVITAMIN WITH MINERALS) TABS Take 1 tablet by mouth every morning.     . potassium chloride SA (K-DUR,KLOR-CON) 20 MEQ tablet Take 1 tablet (20 mEq total) by mouth daily. 30 tablet 2  . simvastatin (ZOCOR) 40 MG tablet Take 40 mg by mouth at bedtime.     No current facility-administered medications for this visit.    Past Medical History  Diagnosis Date  . Diabetes mellitus   . High cholesterol   . Leukocytosis   . Anemia   . Obesity   . Sickle cell trait   . Hypertension     takes medicine to protect kidneys, does not have HTN  . Bronchitis   . Pneumonia   . Thymoma     Past Surgical History  Procedure Laterality Date  . Tonsillectomy    . Tubal ligation    . Mediastinotomy chamberlain mcneil Right 11/06/2012    Procedure: MEDIASTINOTOMY CHAMBERLAIN MCNEILPROCEDURE;  Surgeon: Gaye Pollack, MD;  Location: [facility];  Service: Thoracic;  Laterality: Right;  . Lymph node biopsy Right 11/30/2012    Procedure: RIGHT TONSIL  BIOPSY WITH FRESH FROZEN ANALYSIS;  Surgeon: Jodi Marble, MD;  Location: [place];  Service: ENT;  Laterality: Right;  . Mediasternotomy N/A 01/12/2013    Procedure: MEDIAN STERNOTOMY;  Surgeon: Gaye Pollack, MD;  Location: [place];  Service: Thoracic;  Laterality: N/A;  . Resection of a thymoma N/A 01/12/2013    Procedure: RESECTION OF A THYMOMA;  Surgeon: Gaye Pollack, MD;  Location: [place];  Service: Thoracic;  Laterality: N/A;  . Left heart catheterization with coronary angiogram N/A 05/25/2013    Procedure: LEFT HEART CATHETERIZATION WITH CORONARY ANGIOGRAM;  Surgeon: Troy Sine, MD;  Location: [facility] CATH LAB;  Service: Cardiovascular;  Laterality: N/A;     ROS:  As stated in the HPI and negative for all other  systems.  PHYSICAL EXAM BP 104/70 mmHg  Pulse 91  Ht 5\' 9"  (1.753 m)  Wt 377 lb 4.8 oz (171.142 kg)  BMI 55.69 kg/m2  LMP 06/17/2012 GENERAL:  Well appearing HEENT:  Pupils equal round and reactive, fundi not visualized, oral mucosa unremarkable NECK:  No jugular venous distention, waveform within normal limits, carotid upstroke brisk and symmetric, no bruits, no thyromegaly LUNGS:  Clear to auscultation bilaterally CHEST:  Unremarkable HEART:  PMI not displaced or sustained,S1 and S2 within normal limits, no S3, no S4, no clicks, no rubs, no murmurs ABD:  Flat, positive bowel sounds normal in frequency in pitch, no bruits, no rebound, no guarding, no midline pulsatile mass, no hepatomegaly, no splenomegaly EXT:  2 plus pulses throughout, trace edema, no cyanosis no clubbing  EKG:  Sinus rhythm, rate 61, axis within normal limits, intervals within normal limits, no acute ST-T wave changes.  ASSESSMENT AND PLAN  DILATED CARDIOMYOPATHY:    She's on a good medical regimen at this point.  Her BP will not allow med titration.  Her EF is improved.  No change to meds is indicated.   PROLONGED QT:  This was noted on a previous EKG. However, this was normal on the EKG today.  No change in therapy is indicated.   OBESITY:  This is contributing to her symptoms.  I had a long discussion with her about this and I gave her very specific instructions.  We again discussed the weight loss clinic in [place].

## 2014-06-20 NOTE — Patient Instructions (Signed)
Your physician recommends that you schedule a follow-up appointment in: 6 months with Dr. Hochrein  

## 2014-06-28 ENCOUNTER — Other Ambulatory Visit: Payer: Self-pay | Admitting: Cardiology

## 2014-06-28 NOTE — Telephone Encounter (Signed)
Rx(s) sent to pharmacy electronically.  

## 2014-07-29 ENCOUNTER — Other Ambulatory Visit: Payer: Self-pay | Admitting: Cardiology

## 2014-07-30 NOTE — Telephone Encounter (Signed)
Rx(s) sent to pharmacy electronically.  

## 2014-08-01 ENCOUNTER — Telehealth: Payer: Self-pay | Admitting: Cardiology

## 2014-08-01 NOTE — Telephone Encounter (Signed)
Please call,concerning her disability forms.

## 2014-08-01 NOTE — Telephone Encounter (Signed)
Patient is calling about her disability  Patient reports her congestive heart failure was classified as not-disabling.   Message routed to [employer]

## 2014-08-02 NOTE — Telephone Encounter (Signed)
This message was left on my answering service from yesterday afternoon-Pt wants to know her diagnosis.

## 2014-08-27 NOTE — Telephone Encounter (Signed)
Forwarded to Jenna.

## 2014-08-27 NOTE — Telephone Encounter (Signed)
Message closed. Was routed to medical records to address disability forms in July 2016

## 2014-09-03 ENCOUNTER — Other Ambulatory Visit: Payer: Self-pay | Admitting: Nurse Practitioner

## 2014-09-03 ENCOUNTER — Other Ambulatory Visit: Payer: Self-pay

## 2014-09-03 DIAGNOSIS — Z1231 Encounter for screening mammogram for malignant neoplasm of breast: Secondary | ICD-10-CM

## 2014-09-06 ENCOUNTER — Other Ambulatory Visit: Payer: BLUE CROSS/BLUE SHIELD

## 2014-09-06 ENCOUNTER — Ambulatory Visit: Payer: BLUE CROSS/BLUE SHIELD

## 2014-09-10 ENCOUNTER — Ambulatory Visit
Admission: RE | Admit: 2014-09-10 | Discharge: 2014-09-10 | Disposition: A | Discharge status: Home / Self Care | Payer: BLUE CROSS/BLUE SHIELD | Source: Ambulatory Visit | Attending: Nurse Practitioner | Admitting: Nurse Practitioner

## 2014-09-10 DIAGNOSIS — Z1231 Encounter for screening mammogram for malignant neoplasm of breast: Secondary | ICD-10-CM

## 2014-09-13 ENCOUNTER — Ambulatory Visit: Payer: BLUE CROSS/BLUE SHIELD | Admitting: Oncology

## 2014-10-03 ENCOUNTER — Other Ambulatory Visit: Payer: Self-pay | Admitting: *Deleted

## 2014-10-03 ENCOUNTER — Encounter: Payer: Self-pay | Admitting: *Deleted

## 2014-10-03 ENCOUNTER — Telehealth: Payer: Self-pay | Admitting: Cardiology

## 2014-10-03 NOTE — Telephone Encounter (Signed)
Called patient to verify pharmacy. Unable to reach patient or LVM

## 2014-10-03 NOTE — Telephone Encounter (Signed)
°  1. Which medications need to be refilled? Furosemide  2. Which pharmacy is medication to be sent to?[phone]  3. Do they need a 30 day or 90 day supply? 90 and refills  4. Would they like a call back once the medication has been sent to the pharmacy? yes

## 2014-10-04 ENCOUNTER — Other Ambulatory Visit: Payer: Self-pay

## 2014-10-04 MED ORDER — FUROSEMIDE 40 MG PO TABS: 40.0000 mg | ORAL_TABLET | Freq: Two times a day (BID) | ORAL | Status: DC

## 2014-10-04 NOTE — Telephone Encounter (Signed)
Sent to [facility]. Patient informed via voicemail.

## 2014-10-11 ENCOUNTER — Telehealth: Payer: Self-pay | Admitting: Oncology

## 2014-10-11 ENCOUNTER — Ambulatory Visit (HOSPITAL_BASED_OUTPATIENT_CLINIC_OR_DEPARTMENT_OTHER): Payer: BLUE CROSS/BLUE SHIELD | Admitting: Oncology

## 2014-10-11 VITALS — BP 107/67 | HR 96 | Temp 99.1°F | Resp 20 | Ht 69.0 in | Wt 391.6 lb

## 2014-10-11 DIAGNOSIS — D72829 Elevated white blood cell count, unspecified: Secondary | ICD-10-CM | POA: Diagnosis not present

## 2014-10-11 DIAGNOSIS — C37 Malignant neoplasm of thymus: Secondary | ICD-10-CM | POA: Diagnosis not present

## 2014-10-11 DIAGNOSIS — D500 Iron deficiency anemia secondary to blood loss (chronic): Secondary | ICD-10-CM

## 2014-10-11 DIAGNOSIS — D150 Benign neoplasm of thymus: Secondary | ICD-10-CM

## 2014-10-11 DIAGNOSIS — D4989 Neoplasm of unspecified behavior of other specified sites: Secondary | ICD-10-CM

## 2014-10-11 NOTE — Telephone Encounter (Signed)
Pt confirmed labs/ov per 09/30 POF, gave pt AVS and Calendar... KJ

## 2014-10-11 NOTE — Progress Notes (Signed)
Hematology and Oncology Follow Up Visit  Kaitlin [name] [id number] 03-Oct-1965 49 y.o. 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 She presented with a large mediastinal mass and shortness of breath.  Past therapy: She is status post median sternotomy with resection of the thymoma on 01/12/13.   Current therapy: Observation and surveillance.  Interim History: Kaitlin [name] presents today for a follow up visit.  Since her last visit, she reports no major complaints. She was diagnosed with upper respiratory tract infection and finished a course of Z-Pak. Prior to that episode, she had not had any breathing difficulties. She does not report any cough, dyspnea or hemoptysis. She still reports sinus congestion which is improving slowly. She has been very busy taking care of her husband was been diagnosed with multiple myeloma and actively being treated with chemotherapy.  Her activity level seems to be improving and has not reported any chest pain or discomfort. She continues to be on iron supplements without any intolerance or dyspepsia.   She is not reporting any headaches or blurry vision or syncope. She does not report any palpitation or orthopnea. She does not report any nausea or vomiting or abdominal pain. She has not reported any fevers or chills or sweats. She does not report any frequency urgency or hesitancy. Her rest or view of system is unremarkable.  Medications: I have reviewed the patient's current medications.  Current Outpatient Prescriptions  Medication Sig Dispense Refill  . acetaminophen (TYLENOL) 500 MG tablet Take 1,000 mg by mouth daily as needed (pain).     Marland Kitchen albuterol (PROVENTIL HFA;VENTOLIN HFA) 108 (90 BASE) MCG/ACT inhaler Inhale 1-2 puffs into the lungs every 6 (six) hours as needed for wheezing or shortness of breath.    Marland Kitchen aspirin EC 325 MG tablet Take 325 mg by mouth daily.    . carvedilol (COREG) 25 MG tablet  TAKE 1 BY MOUTH TWICE DAILY 90 tablet 3  . cholecalciferol (VITAMIN D) 1000 UNITS tablet Take 2,000 Units by mouth daily.    . ferrous sulfate 325 (65 FE) MG EC tablet Take 1 tablet (325 mg total) by mouth daily with breakfast. 30 tablet 3  . fluticasone (FLONASE) 50 MCG/ACT nasal spray Place 1 spray into both nostrils daily as needed for allergies or rhinitis.    . furosemide (LASIX) 40 MG tablet Take 1 tablet (40 mg total) by mouth 2 (two) times daily. 60 tablet 8  . glucose blood (BAYER CONTOUR NEXT TEST) test strip As directed    . HYDROcodone-acetaminophen (NORCO/VICODIN) 5-325 MG per tablet Take 1-2 tablets by mouth every 4 (four) hours as needed for moderate pain or severe pain. 6 tablet 0  . ibuprofen (ADVIL,MOTRIN) 200 MG tablet Take 400 mg by mouth every 6 (six) hours as needed.    . insulin aspart (NOVOLOG) 100 UNIT/ML injection Injects up to 267 units of insulin daily via pump  DX E11.9 and Z96.41    . Insulin Human (INSULIN PUMP) SOLN Inject into the skin as directed.    Marland Kitchen lisinopril (PRINIVIL,ZESTRIL) 10 MG tablet Take 1 tablet (10 mg total) by mouth 2 (two) times daily. 180 tablet 3  . metFORMIN (GLUCOPHAGE) 1000 MG tablet Take 1,000 mg by mouth 2 (two) times daily with a meal.    . Multiple Vitamin (MULTIVITAMIN WITH MINERALS) TABS Take 1 tablet by mouth every morning.     . potassium chloride SA (K-DUR,KLOR-CON) 20 MEQ tablet Take  1 tablet (20 mEq total) by mouth daily. 30 tablet 2  . promethazine-codeine (PHENERGAN WITH CODEINE) 6.25-10 MG/5ML syrup Take as directed  0  . simvastatin (ZOCOR) 40 MG tablet Take 40 mg by mouth at bedtime.     No current facility-administered medications for this visit.    Allergies:  Allergies  Allergen Reactions  . Amoxicillin-Pot Clavulanate Diarrhea     Physical Exam: Blood pressure 107/67, pulse 96, temperature 99.1 F (37.3 C), temperature source Oral, resp. rate 20, height '5\' 9"'  (1.753 m), weight 391 lb 9.6 oz (177.629 kg), last  menstrual period 06/17/2012, SpO2 100 %. ECOG: 1 General appearance: alert awake pleasant woman without distress. Head: Normocephalic, without obvious abnormality, atraumatic.  No oral ulcers or lesions. Neck: no adenopathy.  Lymph nodes: Cervical, supraclavicular, and axillary nodes normal. Heart:regular rate and rhythm, S1, S2 normal, no murmur, click, rub or gallop Lung:chest clear, no wheezing, rales, normal symmetric air entry Abdomin: soft, non-tender, without masses or organomegaly EXT:no erythema, induration, or nodules. 1+ edema noted.   Lab Results: Lab Results  Component Value Date   WBC 12.3* 05/08/2014   HGB 11.8 05/08/2014   HCT 37.7 05/08/2014   MCV 74.6* 05/08/2014   PLT 312 05/08/2014     Chemistry      Component Value Date/Time   NA 144 05/08/2014 1429   NA 144 01/08/2014 2206   K 4.5 05/08/2014 1429   K 4.4 01/08/2014 2206   CL 109 01/08/2014 2206   CL 108* 03/29/2012 0945   CO2 25 05/08/2014 1429   CO2 27 01/08/2014 2206   BUN 24.7 05/08/2014 1429   BUN 25* 01/08/2014 2206   CREATININE 1.3* 05/08/2014 1429   CREATININE 0.97 01/08/2014 2206   CREATININE 0.85 05/30/2013 0943      Component Value Date/Time   CALCIUM 9.7 05/08/2014 1429   CALCIUM 9.9 01/08/2014 2206   ALKPHOS 96 05/08/2014 1429   ALKPHOS 93 01/09/2014 0248   AST 11 05/08/2014 1429   AST 19 01/09/2014 0248   ALT 13 05/08/2014 1429   ALT 18 01/09/2014 0248   BILITOT 0.21 05/08/2014 1429   BILITOT 0.4 01/09/2014 0248        Impression and Plan:  49 year old woman with the following issues:   1. 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 Her pathological staging was T2 N0. She did have a focally involved margin and radiation was deferred for salvage purposes. CT scan from 09/10/2013 did not show any evidence of relapse disease. The plan is to continue with active surveillance and repeat imaging studies in 6 months. We'll probably repeat imaging studies  as needed after that.  2. Microcytosis and mild anemia: likely due to mild iron deficiency anemia. She is currently on iron supplements and her hemoglobin is normal at this time. I will repeat her iron studies before the next visit. I encouraged her to continue oral iron replacement and we'll consider IV iron in the future if needed to.  3. Fluid retention: Likely related to dilated cardiomyopathy with a 25%. She is currently on diuretics and followed by cardiology. This seems to be improving at this time.   [facility], MD 9/30/201610:54 AM

## 2014-10-18 ENCOUNTER — Ambulatory Visit (INDEPENDENT_AMBULATORY_CARE_PROVIDER_SITE_OTHER): Payer: BLUE CROSS/BLUE SHIELD | Admitting: Neurology

## 2014-10-18 ENCOUNTER — Encounter: Payer: Self-pay | Admitting: Neurology

## 2014-10-18 VITALS — BP 120/80 | HR 97 | Ht 69.0 in | Wt 388.8 lb

## 2014-10-18 DIAGNOSIS — R531 Weakness: Secondary | ICD-10-CM | POA: Insufficient documentation

## 2014-10-18 DIAGNOSIS — M6289 Other specified disorders of muscle: Secondary | ICD-10-CM | POA: Diagnosis not present

## 2014-10-18 DIAGNOSIS — R29898 Other symptoms and signs involving the musculoskeletal system: Secondary | ICD-10-CM | POA: Insufficient documentation

## 2014-10-18 DIAGNOSIS — R202 Paresthesia of skin: Secondary | ICD-10-CM | POA: Insufficient documentation

## 2014-10-18 NOTE — Progress Notes (Signed)
[facility]    Provider:  Dr Jaynee Eagles Referring Provider: Worthy Rancher, Rosalia Primary Care Physician:  No PCP Per Patient  CC:  Weakness  HPI:  Kaitlin [name] is a morbidly obese 49 y.o. female here as a referral from Dr. Cleone Slim for weakness. PMHx diabetes,HLD,sickle cell trait, HTN.  She had a tumor removed and shotly afterwards she had problems with her left leg. Weakness increases when blood glucose is high. No pain, all weakness. She can't lift her left leg, has to swing it out of the car. Difficulty going up stairs, getting in and out of the car, going up and down steps. She feels unbalanced. She started noticing the weakness 2 weeks after the surgery. The weakness is worse some days than others but not necessarily progressing. No muscle loss or wasting. No dysartrhia, dysphagia, facial droop or other focal neurologic deficits. She has numbness and tingling in the left lateral side of the upper leg. She is taking gabapentin which helps. She was at point having burning and extreme pain in the left leg but neurontin helped.  Pain in the left lateral thigh corresponds with the weakness. Not getting better. Has not fallen but is at risk. She has gained about 50 pounds since the surgery. Has low back pain but no radicular symptoms.   Reviewed notes, labs and imaging from outside physicians, which showed:  CT of the head 11/2012 showed No acute intracranial abnormalities including mass lesion or mass effect, hydrocephalus, extra-axial fluid collection, midline shift, hemorrhage, or acute infarction, large ischemic events (personally reviewed images)  CMP with elevated creatinine 1.3   Review of Systems: Patient complains of symptoms per HPI as well as the following symptoms: weight gain, fatigue, los sof vision, cough, wheezing, snoring, swelling in legs, increased thirst, joint pain, cramps, allergies, runny nose, weakness, not enough sleep, decreased appetite. Pertinent  negatives per HPI. All others negative.   Social History   Social History  . Marital Status: Married    Spouse Name: N/A  . Number of Children: 3  . Years of Education: 14   Occupational History  . Unemployed    Social History Main Topics  . Smoking status: Passive Smoke Exposure - Never Smoker  . Smokeless tobacco: Never Used  . Alcohol Use: No  . Drug Use: No  . Sexual Activity: Yes    Birth Control/ Protection: Surgical   Other Topics Concern  . Not on file   Social History Narrative   Lives at home with mother, husband and son   Caffeine use: none    Family History  Problem Relation Age of Onset  . Heart disease Father     No details  . Diabetes type II      Family HX  . Breast cancer      Past Medical History  Diagnosis Date  . Diabetes mellitus   . High cholesterol   . Leukocytosis   . Anemia   . Obesity   . Sickle cell trait ([place])   . Hypertension     takes medicine to protect kidneys, does not have HTN  . Bronchitis   . Pneumonia   . Thymoma     Past Surgical History  Procedure Laterality Date  . Tonsillectomy    . Tubal ligation    . Mediastinotomy chamberlain mcneil Right 11/06/2012    Procedure: MEDIASTINOTOMY CHAMBERLAIN MCNEILPROCEDURE;  Surgeon: Gaye Pollack, MD;  Location: [facility] OR;  Service: Thoracic;  Laterality: Right;  . Lymph  node biopsy Right 11/30/2012    Procedure: RIGHT TONSIL BIOPSY WITH FRESH FROZEN ANALYSIS;  Surgeon: Jodi Marble, MD;  Location: [place];  Service: ENT;  Laterality: Right;  . Mediasternotomy N/A 01/12/2013    Procedure: MEDIAN STERNOTOMY;  Surgeon: Gaye Pollack, MD;  Location: [place];  Service: Thoracic;  Laterality: N/A;  . Resection of a thymoma N/A 01/12/2013    Procedure: RESECTION OF A THYMOMA;  Surgeon: Gaye Pollack, MD;  Location: [place];  Service: Thoracic;  Laterality: N/A;  . Left heart catheterization with coronary angiogram N/A 05/25/2013    Procedure: LEFT HEART CATHETERIZATION WITH CORONARY  ANGIOGRAM;  Surgeon: Troy Sine, MD;  Location: [facility] CATH LAB;  Service: Cardiovascular;  Laterality: N/A;    Current Outpatient Prescriptions  Medication Sig Dispense Refill  . acetaminophen (TYLENOL) 500 MG tablet Take 1,000 mg by mouth daily as needed (pain).     Marland Kitchen albuterol (PROVENTIL HFA;VENTOLIN HFA) 108 (90 BASE) MCG/ACT inhaler Inhale 1-2 puffs into the lungs every 6 (six) hours as needed for wheezing or shortness of breath.    Marland Kitchen aspirin EC 325 MG tablet Take 325 mg by mouth daily.    . carvedilol (COREG) 25 MG tablet TAKE 1 BY MOUTH TWICE DAILY 90 tablet 3  . cholecalciferol (VITAMIN D) 1000 UNITS tablet Take 2,000 Units by mouth daily.    . ferrous sulfate 325 (65 FE) MG EC tablet Take 1 tablet (325 mg total) by mouth daily with breakfast. 30 tablet 3  . furosemide (LASIX) 40 MG tablet Take 1 tablet (40 mg total) by mouth 2 (two) times daily. 60 tablet 8  . glucose blood (BAYER CONTOUR NEXT TEST) test strip As directed    . ibuprofen (ADVIL,MOTRIN) 200 MG tablet Take 400 mg by mouth every 6 (six) hours as needed.    . insulin aspart (NOVOLOG) 100 UNIT/ML injection Injects up to 267 units of insulin daily via pump  DX E11.9 and Z96.41    . Insulin Human (INSULIN PUMP) SOLN Inject into the skin as directed.    Marland Kitchen lisinopril (PRINIVIL,ZESTRIL) 10 MG tablet Take 1 tablet (10 mg total) by mouth 2 (two) times daily. 180 tablet 3  . metFORMIN (GLUCOPHAGE) 1000 MG tablet Take 1,000 mg by mouth 2 (two) times daily with a meal.    . Multiple Vitamin (MULTIVITAMIN WITH MINERALS) TABS Take 1 tablet by mouth every morning.     . potassium chloride SA (K-DUR,KLOR-CON) 20 MEQ tablet Take 1 tablet (20 mEq total) by mouth daily. 30 tablet 2  . promethazine-codeine (PHENERGAN WITH CODEINE) 6.25-10 MG/5ML syrup Take as directed  0  . simvastatin (ZOCOR) 40 MG tablet Take 40 mg by mouth at bedtime.     No current facility-administered medications for this visit.    Allergies as of 10/18/2014 -  Review Complete 10/18/2014  Allergen Reaction Noted  . Amoxicillin-pot clavulanate Diarrhea 04/16/2011    Vitals: BP 120/80 mmHg  Pulse 97  Ht 5\' 9"  (1.753 m)  Wt 388 lb 12.8 oz (176.359 kg)  BMI 57.39 kg/m2  LMP 06/17/2012 Last Weight:  Wt Readings from Last 1 Encounters:  10/18/14 388 lb 12.8 oz (176.359 kg)   Last Height:   Ht Readings from Last 1 Encounters:  10/18/14 5\' 9"  (1.753 m)   Physical exam: Exam: Gen: NAD, conversant, well nourised, morbidly obese, well groomed                     CV: RRR,  no MRG. No Carotid Bruits. No peripheral edema, warm, nontender Eyes: Conjunctivae clear without exudates or hemorrhage  Neuro: Detailed Neurologic Exam  Speech:    Speech is normal; fluent and spontaneous with normal comprehension.  Cognition:    The patient is oriented to person, place, and time;     recent and remote memory intact;     language fluent;     normal attention, concentration,     fund of knowledge Cranial Nerves:    The pupils are equal, round, and reactive to light. The fundi are flat. Visual fields are full to finger confrontation. Extraocular movements are intact. Trigeminal sensation is intact and the muscles of mastication are normal. The face is symmetric. The palate elevates in the midline. Hearing intact. Voice is normal. Shoulder shrug is normal. The tongue has normal motion without fasciculations.   Coordination:    No dysmetria  Gait:    Wide based possibly due to large body habitus. Heel and toe walking intact.   Motor Observation:    No asymmetry, no atrophy, and no involuntary movements noted. Tone:    Normal muscle tone.    Posture:    Posture is normal. normal erect    Strength: Left hip flexion weakness. Otherwise strength is V/V in the upper and lower limbs.      Sensation: hyperesthesias in the distribution of the left lateral femoral cutaneous nerve     Reflex Exam:  DTR's: decreased left patellar as compared to the right.  Absent AJs.    Toes:    The toes are downgoing bilaterally.   Clonus:    Clonus is absent.        Assessment/Plan:  48 year old morbidly obese female with left hip flexion weakness and hyperesthesias in the distribution of the left lateral femoral cutaneous nerve and decreased left patellar. Will order MRI of the lumbar spine and emg/ncs. Was acute onset, will also order MRi of the brain. She is a fall risk, recommend walking aids.   Cc Dr Cleone Slim  Sarina Ill, MD  [facility] [address] [place], [place] [address]  Phone [phone] Fax [phone]

## 2014-10-18 NOTE — Patient Instructions (Signed)
Overall you are doing fairly well but I do want to suggest a few things today:   Remember to drink plenty of fluid, eat healthy meals and do not skip any meals. Try to eat protein with a every meal and eat a healthy snack such as fruit or nuts in between meals. Try to keep a regular sleep-wake schedule and try to exercise daily, particularly in the form of walking, 20-30 minutes a day, if you can.   As far as diagnostic testing: MRi of the brain and lumbar spine  I would like to see you back in 2-3 weeks for emg/ncs, sooner if we need to. Please call us with any interim questions, concerns, problems, updates or refill requests.   Please also call us for any test results so we can go over those with you on the phone.  My clinical assistant and will answer any of your questions and relay your messages to me and also relay most of my messages to you.   Our phone number is [phone]. We also have an after hours call service for urgent matters and there is a physician on-call for urgent questions. For any emergencies you know to call 911 or go to the nearest emergency room

## 2014-10-23 ENCOUNTER — Encounter (HOSPITAL_COMMUNITY): Payer: Self-pay

## 2014-10-23 ENCOUNTER — Other Ambulatory Visit (HOSPITAL_BASED_OUTPATIENT_CLINIC_OR_DEPARTMENT_OTHER): Payer: BLUE CROSS/BLUE SHIELD

## 2014-10-23 ENCOUNTER — Ambulatory Visit (HOSPITAL_COMMUNITY)
Admission: RE | Admit: 2014-10-23 | Discharge: 2014-10-23 | Disposition: A | Discharge status: Home / Self Care | Payer: BLUE CROSS/BLUE SHIELD | Source: Ambulatory Visit | Attending: Oncology | Admitting: Oncology

## 2014-10-23 DIAGNOSIS — D4989 Neoplasm of unspecified behavior of other specified sites: Secondary | ICD-10-CM

## 2014-10-23 DIAGNOSIS — Z9089 Acquired absence of other organs: Secondary | ICD-10-CM | POA: Insufficient documentation

## 2014-10-23 DIAGNOSIS — D150 Benign neoplasm of thymus: Secondary | ICD-10-CM | POA: Diagnosis present

## 2014-10-23 DIAGNOSIS — C37 Malignant neoplasm of thymus: Secondary | ICD-10-CM

## 2014-10-23 DIAGNOSIS — D500 Iron deficiency anemia secondary to blood loss (chronic): Secondary | ICD-10-CM

## 2014-10-23 DIAGNOSIS — R911 Solitary pulmonary nodule: Secondary | ICD-10-CM | POA: Diagnosis not present

## 2014-10-23 LAB — CBC WITH DIFFERENTIAL/PLATELET
BASO%: 0.8 % (ref 0.0–2.0)
Basophils Absolute: 0.1 10*3/uL (ref 0.0–0.1)
EOS%: 0 % (ref 0.0–7.0)
Eosinophils Absolute: 0 10*3/uL (ref 0.0–0.5)
HCT: 36.4 % (ref 34.8–46.6)
HGB: 12.1 g/dL (ref 11.6–15.9)
LYMPH%: 42.5 % (ref 14.0–49.7)
MCH: 24.4 pg — ABNORMAL LOW (ref 25.1–34.0)
MCHC: 33.2 g/dL (ref 31.5–36.0)
MCV: 73.6 fL — ABNORMAL LOW (ref 79.5–101.0)
MONO#: 0.5 10*3/uL (ref 0.1–0.9)
MONO%: 4.8 % (ref 0.0–14.0)
NEUT#: 5.4 10*3/uL (ref 1.5–6.5)
NEUT%: 51.9 % (ref 38.4–76.8)
PLATELETS: 315 10*3/uL (ref 145–400)
RBC: 4.94 10*6/uL (ref 3.70–5.45)
RDW: 18.6 % — ABNORMAL HIGH (ref 11.2–14.5)
WBC: 10.4 10*3/uL — ABNORMAL HIGH (ref 3.9–10.3)
lymph#: 4.4 10*3/uL — ABNORMAL HIGH (ref 0.9–3.3)

## 2014-10-23 LAB — COMPREHENSIVE METABOLIC PANEL (CC13)
ALT: 20 U/L (ref 0–55)
ANION GAP: 8 meq/L (ref 3–11)
AST: 17 U/L (ref 5–34)
Albumin: 3.4 g/dL — ABNORMAL LOW (ref 3.5–5.0)
Alkaline Phosphatase: 91 U/L (ref 40–150)
BUN: 20.4 mg/dL (ref 7.0–26.0)
CHLORIDE: 107 meq/L (ref 98–109)
CO2: 30 mEq/L — ABNORMAL HIGH (ref 22–29)
Calcium: 9.7 mg/dL (ref 8.4–10.4)
Creatinine: 1.1 mg/dL (ref 0.6–1.1)
EGFR: 72 mL/min/{1.73_m2} — ABNORMAL LOW (ref >90)
Glucose: 128 mg/dl (ref 70–140)
POTASSIUM: 4 meq/L (ref 3.5–5.1)
Sodium: 146 mEq/L — ABNORMAL HIGH (ref 136–145)
Total Bilirubin: 0.41 mg/dL (ref 0.20–1.20)
Total Protein: 6.6 g/dL (ref 6.4–8.3)

## 2014-10-23 LAB — FERRITIN CHCC: Ferritin: 48 ng/ml (ref 9–269)

## 2014-10-23 MED ORDER — IOHEXOL 300 MG/ML  SOLN
75.0000 mL | Freq: Once | INTRAMUSCULAR | Status: AC | PRN
  Administered 2014-10-23: 75 mL via INTRAVENOUS

## 2014-10-24 ENCOUNTER — Telehealth: Payer: Self-pay | Admitting: *Deleted

## 2014-10-24 NOTE — Telephone Encounter (Signed)
-----   Message from Wyatt Portela, MD sent at 10/23/2014  2:57 PM EDT ----- Please let her know that her scan showed NO cancer

## 2014-10-24 NOTE — Telephone Encounter (Signed)
As noted below by Dr. Alen Blew, I informed patient that her scan showed NO cancer. Patient verbalized understanding.

## 2014-10-31 ENCOUNTER — Ambulatory Visit (INDEPENDENT_AMBULATORY_CARE_PROVIDER_SITE_OTHER): Payer: Self-pay | Admitting: Neurology

## 2014-10-31 ENCOUNTER — Ambulatory Visit (INDEPENDENT_AMBULATORY_CARE_PROVIDER_SITE_OTHER): Payer: BLUE CROSS/BLUE SHIELD | Admitting: Neurology

## 2014-10-31 DIAGNOSIS — R29898 Other symptoms and signs involving the musculoskeletal system: Secondary | ICD-10-CM

## 2014-10-31 DIAGNOSIS — R531 Weakness: Secondary | ICD-10-CM

## 2014-10-31 DIAGNOSIS — G609 Hereditary and idiopathic neuropathy, unspecified: Secondary | ICD-10-CM | POA: Diagnosis not present

## 2014-10-31 DIAGNOSIS — Z0289 Encounter for other administrative examinations: Secondary | ICD-10-CM

## 2014-10-31 DIAGNOSIS — M5416 Radiculopathy, lumbar region: Secondary | ICD-10-CM

## 2014-10-31 DIAGNOSIS — R202 Paresthesia of skin: Secondary | ICD-10-CM

## 2014-10-31 NOTE — Progress Notes (Signed)
  [facility]    Provider:  Dr Jaynee Eagles Referring Provider: No ref. provider found Primary Care Physician:  Worthy Rancher, FNP  History: Kaitlin [name] is a morbidly obese 49 y.o. female here as a referral from Dr. Cleone Slim for weakness. PMHx diabetes,HLD,sickle cell trait, HTN. She had a tumor removed and shotly afterwards she had problems with her left leg. Weakness increases when blood glucose is high. She can't lift her left leg, has to swing it out of the car. Difficulty going up stairs, getting in and out of the car, going up and down steps. She feels unbalanced. She started noticing the weakness 2 weeks after the surgery. The weakness is worse some days than others but not necessarily progressing. No muscle loss or wasting. No dysartrhia, dysphagia, facial droop or other focal neurologic deficits. She has numbness and tingling in the left lateral side of the upper leg. She is taking gabapentin which helps. She was having burning and extreme pain in the left lateral leg but neurontin helped. Pain in the left lateral thigh corresponds with the weakness. Not getting better. Has not fallen but is at risk. She has gained about 50 pounds since the surgery. Has low back pain but no radicular symptoms.   Summary: Nerve conduction studies were performed on the bilateral lower extremities.   The right Peroneal motor nerve showed reduced amplitude (1.73mV, N>2)  The left Peroneal motor nerve showed reduced amplitude (0.56mV, N>2)  The rightTibial motor nerve showed reduced amplitude (0.108mV, N>3) The left Tibial motor nerve showed reduced amplitude (0.66mV, N>3)  The bilateral Peroneal sensory nerves showed no response.  The bilateral Sural sensory nerves showed no response.  Bilateral H reflex responses were absent. F Wave studies indicate that the left peroneal F wave has no response.  The left tibial F wave has no response. The right tibial F wave has no response.  The right  peroneal F wave has prolonged latency (40ms, N<56)  EMG needle study was performed on selected left lower extremity muscles.  The left Abductor Hallucis muscle showed increased spontaneous activity(positive sharp waves), increased motor unit amplitude, increased motor unit duration, and diminished recruitment. The left Iliopsoas, left Adductor Magnus, left Vastus Medialis, left Anterior Tibialis, left Medial Gastrocnemius muscles were within normal limits.  Due to patient's large body habitus, patient could not lay on her side or on her stomach for emg/needle evaluation of other proximal or paraspinal muscles.   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 No suggestion of myopathy. Evidence for radiculopathy was not found however limited exam due to large body habitus.   Sarina Ill, MD  [facility] [address] [place], [place] [address]  Phone [phone] Fax [phone]

## 2014-11-03 NOTE — Progress Notes (Signed)
See procedure note.

## 2014-11-04 NOTE — Procedures (Signed)
[facility]    Provider:  Dr Jaynee Eagles Referring Provider: No ref. provider found Primary Care Physician:  Worthy Rancher, FNP  History: Kaitlin [name] is a morbidly obese 49 y.o. female here as a referral from Dr. Cleone Slim for weakness. PMHx diabetes,HLD,sickle cell trait, HTN. She had a tumor removed and shotly afterwards she had problems with her left leg. Weakness increases when blood glucose is high. She can't lift her left leg, has to swing it out of the car. Difficulty going up stairs, getting in and out of the car, going up and down steps. She feels unbalanced. She started noticing the weakness 2 weeks after the surgery. The weakness is worse some days than others but not necessarily progressing. No muscle loss or wasting. No dysartrhia, dysphagia, facial droop or other focal neurologic deficits. She has numbness and tingling in the left lateral side of the upper leg. She is taking gabapentin which helps. She was having burning and extreme pain in the left lateral leg but neurontin helped. Pain in the left lateral thigh corresponds with the weakness. Not getting better. Has not fallen but is at risk. She has gained about 50 pounds since the surgery. Has low back pain but no radicular symptoms.   Summary: Nerve conduction studies were performed on the bilateral lower extremities.   The right Peroneal motor nerve showed reduced amplitude (1.92mV, N>2)  The left Peroneal motor nerve showed reduced amplitude (0.29mV, N>2)  The rightTibial motor nerve showed reduced amplitude (0.66mV, N>3) The left Tibial motor nerve showed reduced amplitude (0.65mV, N>3)  The bilateral Peroneal sensory nerves showed no response.  The bilateral Sural sensory nerves showed no response.  Bilateral H reflex responses were absent. F Wave studies indicate that the left peroneal F wave has no response.  The left tibial F wave has no response. The right tibial F wave has no response.  The right  peroneal F wave has prolonged latency (22ms, N<56)  EMG needle study was performed on selected left lower extremity muscles.  The left Abductor Hallucis muscle showed increased spontaneous activity(positive sharp waves), increased motor unit amplitude, increased motor unit duration, and diminished recruitment. The left Iliopsoas, left Adductor Magnus, left Vastus Medialis, left Anterior Tibialis, left Medial Gastrocnemius muscles were within normal limits.  Due to patient's large body habitus, patient could not lay on her side or on her stomach for emg/needle evaluation of other proximal or paraspinal muscles.   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 No suggestion of myopathy or myositis. Evidence for radiculopathy was not found however limited exam due to large body habitus.   Sarina Ill, MD  [facility] [address] [place], [place] [address]  Phone [phone] Fax [phone]

## 2014-11-07 ENCOUNTER — Ambulatory Visit
Admission: RE | Admit: 2014-11-07 | Discharge: 2014-11-07 | Disposition: A | Discharge status: Home / Self Care | Payer: BLUE CROSS/BLUE SHIELD | Source: Ambulatory Visit | Attending: Neurology | Admitting: Neurology

## 2014-11-07 DIAGNOSIS — R531 Weakness: Secondary | ICD-10-CM

## 2014-11-07 DIAGNOSIS — R29898 Other symptoms and signs involving the musculoskeletal system: Secondary | ICD-10-CM

## 2014-11-07 DIAGNOSIS — R202 Paresthesia of skin: Secondary | ICD-10-CM

## 2014-11-07 DIAGNOSIS — M6289 Other specified disorders of muscle: Secondary | ICD-10-CM | POA: Diagnosis not present

## 2014-11-08 ENCOUNTER — Telehealth: Payer: Self-pay | Admitting: *Deleted

## 2014-11-08 NOTE — Telephone Encounter (Signed)
Pt returned call. Was told the nurse would have to call her back. Expressed understanding

## 2014-11-08 NOTE — Telephone Encounter (Signed)
I have spoken with Kaitlin [name] this morning and per Dr. Lavell Anchors, advised that not all of her labwork is back, but what is back mostly looks ok, that ck is mildly high but that AA is not concerned with this at this time, will continue to monitor at future appt's.  I have explained that CK is an enzyme secreted with muscle breakdown.  I have also advised AA will call her once mri results are back.  Kaitlin [name] verbalized understanding of same/fim

## 2014-11-08 NOTE — Telephone Encounter (Signed)
LMTC./fim 

## 2014-11-08 NOTE — Telephone Encounter (Signed)
-----   Message from Melvenia Beam, MD sent at 11/07/2014  7:46 PM EDT ----- Let patient know we are still waiting for all her labs to come back but most are ok. Her CK is mildly elevated which is a muscle enzyme but we frequently see a higher CK in some populations, sometimes in african americans. I'm not concerned. We can repeat that at next appointment and just watch it. I will give her a call when the MRI results are completed. Thanks.

## 2014-11-11 ENCOUNTER — Telehealth: Payer: Self-pay | Admitting: *Deleted

## 2014-11-11 NOTE — Telephone Encounter (Signed)
-----   Message from Melvenia Beam, MD sent at 11/07/2014  7:46 PM EDT ----- Let patient know we are still waiting for all her labs to come back but most are ok. Her CK is mildly elevated which is a muscle enzyme but we frequently see a higher CK in some populations, sometimes in african americans. I'm not concerned. We can repeat that at next appointment and just watch it. I will give her a call when the MRI results are completed. Thanks.

## 2014-11-11 NOTE — Telephone Encounter (Signed)
I have spoken with Kaitlin [name] this afternoon and per AA, advised that mri brain was normal.  She verbalized understanding of same/fim

## 2014-11-11 NOTE — Telephone Encounter (Signed)
I have spoken with Kaitlin [name] this afternoon, and per AA, advised that the mri of her lumbar spine looked ok--just mild arthritic changes, no explanation for leg pain/weakness.  Advised AA will discuss in greater detail at f/u appt. next week.  Kaitlin [name] verbalized understanding of same/fim

## 2014-11-11 NOTE — Telephone Encounter (Signed)
I have spoken with [name] and per AA, advised that not all labs are back, but what is back mostly looks ok.  CK is slightly elevated but AA is not sure if there is clinical significance to this.  Will repeat at next ov to see if it continues to be elevated or is coming back down.  AA would like to see pt. back next week.  Kaitlin [name] verbalized understanding of same.  F/U appt. given 11-18-14 at 0800/fim

## 2014-11-11 NOTE — Telephone Encounter (Signed)
-----   Message from Melvenia Beam, MD sent at 11/11/2014  7:34 AM EDT ----- Let patient know the MRi of her lumbar spine was essentially normal. Some mild arthritis, no pinched nerves or reason for her leg pain and weakness.We can discuss at follow up appointment and discuss next steps.  thanks

## 2014-11-11 NOTE — Telephone Encounter (Signed)
-----   Message from Melvenia Beam, MD sent at 11/11/2014  7:33 AM EDT ----- Let patient know the MRI of her brain was normal

## 2014-11-14 LAB — COMPREHENSIVE METABOLIC PANEL
ALBUMIN: 3.7 g/dL (ref 3.5–5.5)
ALT: 17 IU/L (ref 0–32)
AST: 20 IU/L (ref 0–40)
Albumin/Globulin Ratio: 1.8 (ref 1.1–2.5)
Alkaline Phosphatase: 88 IU/L (ref 39–117)
BILIRUBIN TOTAL: 0.2 mg/dL (ref 0.0–1.2)
BUN / CREAT RATIO: 18 (ref 9–23)
BUN: 17 mg/dL (ref 6–24)
CHLORIDE: 104 mmol/L (ref 97–106)
CO2: 27 mmol/L (ref 18–29)
CREATININE: 0.92 mg/dL (ref 0.57–1.00)
Calcium: 9 mg/dL (ref 8.7–10.2)
GFR, EST AFRICAN AMERICAN: 86 mL/min/{1.73_m2} (ref >59)
GFR, EST NON AFRICAN AMERICAN: 74 mL/min/{1.73_m2} (ref >59)
GLUCOSE: 198 mg/dL — AB (ref 65–99)
Globulin, Total: 2.1 g/dL (ref 1.5–4.5)
Potassium: 4.2 mmol/L (ref 3.5–5.2)
Sodium: 144 mmol/L (ref 136–144)
TOTAL PROTEIN: 5.8 g/dL — AB (ref 6.0–8.5)

## 2014-11-14 LAB — VGCC ANTIBODY: VGCC Antibody: NEGATIVE

## 2014-11-14 LAB — MULTIPLE MYELOMA PANEL, SERUM
ALBUMIN SERPL ELPH-MCNC: 3.1 g/dL (ref 2.9–4.4)
ALBUMIN/GLOB SERPL: 1.2 (ref 0.7–1.7)
Alpha 1: 0.2 g/dL (ref 0.0–0.4)
Alpha2 Glob SerPl Elph-Mcnc: 1 g/dL (ref 0.4–1.0)
B-GLOBULIN SERPL ELPH-MCNC: 0.9 g/dL (ref 0.7–1.3)
GLOBULIN, TOTAL: 2.7 g/dL (ref 2.2–3.9)
Gamma Glob SerPl Elph-Mcnc: 0.6 g/dL (ref 0.4–1.8)
IGG (IMMUNOGLOBIN G), SERUM: 621 mg/dL — AB (ref 700–1600)
IgA/Immunoglobulin A, Serum: <5 mg/dL — ABNORMAL LOW (ref 87–352)
IgM (Immunoglobulin M), Srm: 50 mg/dL (ref 26–217)

## 2014-11-14 LAB — ACETYLCHOLINE RECEPTOR, BINDING: ACHR BINDING AB, SERUM: 0.23 nmol/L (ref 0.00–0.24)

## 2014-11-14 LAB — CK: Total CK: 411 U/L — ABNORMAL HIGH (ref 24–173)

## 2014-11-14 LAB — ACETYLCHOLINE RECEPTOR, BLOCKING: ACETYLCHOL BLOCK AB: 24 % (ref 0–25)

## 2014-11-14 LAB — HEAVY METALS, BLOOD
Arsenic: 6 ug/L (ref 2–23)
LEAD, BLOOD: NOT DETECTED ug/dL (ref 0–19)
MERCURY: NOT DETECTED ug/L (ref 0.0–14.9)

## 2014-11-14 LAB — ACETYLCHOLINE RECEPTOR, MODULATING: Acetylcholine Modulat Ab: <12 % (ref 0–20)

## 2014-11-14 LAB — B12 AND FOLATE PANEL: VITAMIN B 12: 575 pg/mL (ref 211–946)

## 2014-11-14 LAB — HIV ANTIBODY (ROUTINE TESTING W REFLEX): HIV Screen 4th Generation wRfx: NONREACTIVE

## 2014-11-14 LAB — ANA W/REFLEX: Anti Nuclear Antibody(ANA): NEGATIVE

## 2014-11-14 LAB — RHEUMATOID FACTOR: Rhuematoid fact SerPl-aCnc: <10 IU/mL (ref 0.0–13.9)

## 2014-11-14 LAB — METHYLMALONIC ACID, SERUM: Methylmalonic Acid: 102 nmol/L (ref 0–378)

## 2014-11-14 LAB — RPR: RPR: NONREACTIVE

## 2014-11-18 ENCOUNTER — Encounter: Payer: Self-pay | Admitting: Neurology

## 2014-11-18 ENCOUNTER — Ambulatory Visit (INDEPENDENT_AMBULATORY_CARE_PROVIDER_SITE_OTHER): Payer: BLUE CROSS/BLUE SHIELD | Admitting: Neurology

## 2014-11-18 VITALS — BP 136/75 | HR 106 | Ht 69.0 in | Wt 392.2 lb

## 2014-11-18 DIAGNOSIS — R748 Abnormal levels of other serum enzymes: Secondary | ICD-10-CM | POA: Diagnosis not present

## 2014-11-18 DIAGNOSIS — G5712 Meralgia paresthetica, left lower limb: Secondary | ICD-10-CM

## 2014-11-18 DIAGNOSIS — R29898 Other symptoms and signs involving the musculoskeletal system: Secondary | ICD-10-CM

## 2014-11-18 MED ORDER — LIDOCAINE 5 % EX PTCH: 3.0000 | MEDICATED_PATCH | CUTANEOUS | Status: DC

## 2014-11-18 NOTE — Progress Notes (Signed)
[facility]  Provider: Dr Jaynee Eagles Referring Provider: Worthy Rancher, [place] Primary Care Physician: No PCP Per Patient  CC: Weakness  Kaitlin [name] is a morbidly obese 49 y.o. female here as a referral from Dr. Cleone Slim for weakness. PMHx diabetes,HLD,sickle cell trait, HTN. She had a tumor removed and shotly afterwards she had problems with her left leg. She is morbidly obese and has gained more weight. The pain in the leg is still burning on the outside lateral thigh, Explained this could be lateral femoral cutaneous entrapment due to obesity. The pain and weakness in the leg increases with increases in blood glucose. Her glucose is not wekll controlled. She is still having weakness in the left leg.  The MRi of the brain and MRi of the lumbar spine as well as emg/ncs did not reveal any etiology for her weakness. Will send to physical therapy and see if we can get the leg stronger. Recommend managing diabetes and weight loss. Recommend water aerobics. She also reports she is retaining excessive fluid, and she needs to start taking her medication. She takes neurontin which helps but she is afraid of the side effects. Will give her lidocaine patches for the leg. No bowel or bladder changes. No neck or thoracic symptoms.   HPI: Kaitlin [name] is a morbidly obese 49 y.o. female here as a referral from Dr. Cleone Slim for weakness. PMHx diabetes,HLD,sickle cell trait, HTN. She had a tumor removed and shotly afterwards she had problems with her left leg. Weakness increases when blood glucose is high. No pain, all weakness. She can't lift her left leg, has to swing it out of the car. Difficulty going up stairs, getting in and out of the car, going up and down steps. She feels unbalanced. She started noticing the weakness 2 weeks after the surgery. The weakness is worse some days than others but not necessarily progressing. No muscle loss or wasting. No dysartrhia, dysphagia, facial droop or  other focal neurologic deficits. She has numbness and tingling in the left lateral side of the upper leg. She is taking gabapentin which helps. She was at point having burning and extreme pain in the left leg but neurontin helped. Pain in the left lateral thigh corresponds with the weakness. Not getting better. Has not fallen but is at risk. She has gained about 50 pounds since the surgery. Has low back pain but no radicular symptoms.   Reviewed notes, labs and imaging from outside physicians, which showed:  CT of the head 11/2012 showed No acute intracranial abnormalities including mass lesion or mass effect, hydrocephalus, extra-axial fluid collection, midline shift, hemorrhage, or acute infarction, large ischemic events (personally reviewed images)  CMP with elevated creatinine 1.3   Review of Systems: Patient complains of symptoms per HPI as well as the following symptoms: weight gain, fatigue, los sof vision, cough, wheezing, snoring, swelling in legs, increased thirst, joint pain, cramps, allergies, runny nose, weakness, not enough sleep, decreased appetite. Pertinent negatives per HPI. All others negative..   Social History   Social History  . Marital Status: Married    Spouse Name: N/A  . Number of Children: 3  . Years of Education: 14   Occupational History  . Unemployed    Social History Main Topics  . Smoking status: Passive Smoke Exposure - Never Smoker  . Smokeless tobacco: Never Used  . Alcohol Use: No  . Drug Use: No  . Sexual Activity: Yes    Birth Control/ Protection: Surgical  Other Topics Concern  . Not on file   Social History Narrative   Lives at home with mother, husband and son   Caffeine use: none    Family History  Problem Relation Age of Onset  . Heart disease Father     No details  . Diabetes type II      Family HX  . Breast cancer      Past Medical History  Diagnosis Date  . Diabetes mellitus   . High cholesterol   . Leukocytosis   .  Anemia   . Obesity   . Sickle cell trait ([place])   . Hypertension     takes medicine to protect kidneys, does not have HTN  . Bronchitis   . Pneumonia   . Thymoma     Past Surgical History  Procedure Laterality Date  . Tonsillectomy    . Tubal ligation    . Mediastinotomy chamberlain mcneil Right 11/06/2012    Procedure: MEDIASTINOTOMY CHAMBERLAIN MCNEILPROCEDURE;  Surgeon: Gaye Pollack, MD;  Location: [place];  Service: Thoracic;  Laterality: Right;  . Lymph node biopsy Right 11/30/2012    Procedure: RIGHT TONSIL BIOPSY WITH FRESH FROZEN ANALYSIS;  Surgeon: Jodi Marble, MD;  Location: [facility];  Service: ENT;  Laterality: Right;  . Mediasternotomy N/A 01/12/2013    Procedure: MEDIAN STERNOTOMY;  Surgeon: Gaye Pollack, MD;  Location: [facility];  Service: Thoracic;  Laterality: N/A;  . Resection of a thymoma N/A 01/12/2013    Procedure: RESECTION OF A THYMOMA;  Surgeon: Gaye Pollack, MD;  Location: [facility];  Service: Thoracic;  Laterality: N/A;  . Left heart catheterization with coronary angiogram N/A 05/25/2013    Procedure: LEFT HEART CATHETERIZATION WITH CORONARY ANGIOGRAM;  Surgeon: Troy Sine, MD;  Location: [facility] CATH LAB;  Service: Cardiovascular;  Laterality: N/A;    Current Outpatient Prescriptions  Medication Sig Dispense Refill  . acetaminophen (TYLENOL) 500 MG tablet Take 1,000 mg by mouth daily as needed (pain).     Marland Kitchen albuterol (PROVENTIL HFA;VENTOLIN HFA) 108 (90 BASE) MCG/ACT inhaler Inhale 1-2 puffs into the lungs every 6 (six) hours as needed for wheezing or shortness of breath.    Marland Kitchen aspirin EC 325 MG tablet Take 325 mg by mouth daily.    . carvedilol (COREG) 25 MG tablet TAKE 1 BY MOUTH TWICE DAILY 90 tablet 3  . cholecalciferol (VITAMIN D) 1000 UNITS tablet Take 2,000 Units by mouth daily.    . ferrous sulfate 325 (65 FE) MG EC tablet Take 1 tablet (325 mg total) by mouth daily with breakfast. 30 tablet 3  . furosemide (LASIX) 40 MG tablet Take 1 tablet (40 mg total) by  mouth 2 (two) times daily. 60 tablet 8  . glucose blood (BAYER CONTOUR NEXT TEST) test strip As directed    . ibuprofen (ADVIL,MOTRIN) 200 MG tablet Take 400 mg by mouth every 6 (six) hours as needed.    . insulin aspart (NOVOLOG) 100 UNIT/ML injection Injects up to 267 units of insulin daily via pump  DX E11.9 and Z96.41    . Insulin Human (INSULIN PUMP) SOLN Inject into the skin as directed.    Marland Kitchen lisinopril (PRINIVIL,ZESTRIL) 10 MG tablet Take 1 tablet (10 mg total) by mouth 2 (two) times daily. 180 tablet 3  . metFORMIN (GLUCOPHAGE) 1000 MG tablet Take 1,000 mg by mouth 2 (two) times daily with a meal.    . Multiple Vitamin (MULTIVITAMIN WITH MINERALS) TABS Take 1 tablet by mouth  every morning.     . potassium chloride SA (K-DUR,KLOR-CON) 20 MEQ tablet Take 1 tablet (20 mEq total) by mouth daily. 30 tablet 2  . promethazine-codeine (PHENERGAN WITH CODEINE) 6.25-10 MG/5ML syrup Take as directed  0  . simvastatin (ZOCOR) 40 MG tablet Take 40 mg by mouth at bedtime.     No current facility-administered medications for this visit.    Allergies as of 11/18/2014 - Review Complete 10/23/2014  Allergen Reaction Noted  . Amoxicillin-pot clavulanate Diarrhea 04/16/2011    Vitals: BP 136/75 mmHg  Pulse 106  Ht 5\' 9"  (1.753 m)  Wt 392 lb 3.2 oz (177.901 kg)  BMI 57.89 kg/m2  LMP 06/17/2012 Last Weight:  Wt Readings from Last 1 Encounters:  11/18/14 392 lb 3.2 oz (177.901 kg)   Last Height:   Ht Readings from Last 1 Encounters:  11/18/14 5\' 9"  (1.753 m)     Sensation: hyperesthesias in the distribution of the left lateral femoral cutaneous nerve   Strength: Left hip flexion 3/5, right hip flexion 4/5,    Assessment/Plan: 49 year old morbidly obese female with left hip flexion weakness and hyperesthesias in the distribution of the left lateral femoral cutaneous nerve. Will order MRI of the lumbar spine and emg/ncs.   She is morbidly obese and has gained more weight. The pain in  the leg is still burning on the outside lateral thigh, Explained this could be lateral femoral cutaneous entrapment due to obesity and diabetes. The pain and weakness in the leg increases with increases in blood glucose. Her glucose is not well controlled.   The MRi of the brain and MRi of the lumbar spine as well as emg/ncs did not reveal any etiology for her weakness.   - Will send to physical therapy and see if we can get the leg stronger.  - Recommend managing diabetes and weight loss. Recommend water aerobics.  - Will give her lidocaine patches for the possible Meralgia Paresthetica - If left leg weakness persists after PT, can image her cervical and thoracic spine and the lumbar plexus.  - can consider diabetic amyotrophy as etiology. Needs strict control of diabetes and weight loss.  - follow up after PT - repeat CK of mildly elevated CK, emg did not show signs of myopathy or myositis.  May be asymptomatic hyprckemia  Cc Dr Cleone Slim  Sarina Ill, MD  [facility] [address] [place], [place] [address]  Phone [phone] Fax [phone]  A total of 30 minutes was spent face-to-face with this patient. Over half this time was spent on counseling patient on the left leg weakness and possible Meralgia Paresthetica diagnosis and different diagnostic and therapeutic options available.

## 2014-11-18 NOTE — Patient Instructions (Signed)
Overall you are doing fairly well but I do want to suggest a few things today:   Remember to drink plenty of fluid, eat healthy meals and do not skip any meals. Try to eat protein with a every meal and eat a healthy snack such as fruit or nuts in between meals. Try to keep a regular sleep-wake schedule and try to exercise daily, particularly in the form of walking, 20-30 minutes a day, if you can.   As far as your medications are concerned, I would like to suggest: Up to 3 patches daily on skin, 12 hours on, 12 hours off.   As far as diagnostic testing: Physical therapy  I would like to see you back after physical therapy, sooner if we need to. Please call us with any interim questions, concerns, problems, updates or refill requests.   Our phone number is [phone]. We also have an after hours call service for urgent matters and there is a physician on-call for urgent questions. For any emergencies you know to call 911 or go to the nearest emergency room

## 2014-11-19 LAB — CK ISOENZYMES
CK-BB: 0 %
CK-MB: 0 % (ref 0–3)
CK-MM: 100 % (ref 97–100)
MACRO TYPE 2: 0 %
Macro Type 1: 0 %
Total CK: 229 U/L — ABNORMAL HIGH (ref 24–173)

## 2014-11-20 ENCOUNTER — Telehealth: Payer: Self-pay | Admitting: *Deleted

## 2014-11-20 NOTE — Telephone Encounter (Signed)
I have spoken with Kaitlin [name] this morning, and per Dr. Lavell Anchors, advised that CK is much improved with labs done 2 days ago.  I have explained the upper limit of normal is 173--2 weeks ago, CK was 411--it has come down to 229/dim

## 2014-11-20 NOTE — Telephone Encounter (Signed)
-----   Message from Melvenia Beam, MD sent at 11/19/2014  6:12 PM EST ----- Let patient know her CK has improved on this latest lab, thanks

## 2014-12-18 ENCOUNTER — Encounter: Payer: Self-pay | Admitting: Cardiology

## 2014-12-18 ENCOUNTER — Ambulatory Visit (INDEPENDENT_AMBULATORY_CARE_PROVIDER_SITE_OTHER): Payer: BLUE CROSS/BLUE SHIELD | Admitting: Cardiology

## 2014-12-18 VITALS — BP 102/74 | HR 94 | Ht 69.0 in | Wt 390.3 lb

## 2014-12-18 DIAGNOSIS — R0789 Other chest pain: Secondary | ICD-10-CM | POA: Diagnosis not present

## 2014-12-18 NOTE — Progress Notes (Signed)
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  Perfusion imaging suggested a defect in the anteroseptal wall. However, cardiac catheterization demonstrated normal coronaries.   We have titrated her meds.  Echo last December demonstrated the EF was now up to 45-50%.   She's not having any new PND or orthopnea. She's had no further chest discomfort, neck or arm discomfort. Her weights have been going up.  She finds it more difficult to ambulate and do activities.  However her weight is approaching 400 lbs which might add to her symptoms.    Allergies  Allergen Reactions  . Amoxicillin-Pot Clavulanate Diarrhea    Current Outpatient Prescriptions  Medication Sig Dispense Refill  . acetaminophen (TYLENOL) 500 MG tablet Take 1,000 mg by mouth daily as needed (pain).     Marland Kitchen albuterol (PROVENTIL HFA;VENTOLIN HFA) 108 (90 BASE) MCG/ACT inhaler Inhale 1-2 puffs into the lungs every 6 (six) hours as needed for wheezing or shortness of breath.    Marland Kitchen aspirin EC 325 MG tablet Take 325 mg by mouth daily.    . carvedilol (COREG) 25 MG tablet TAKE 1 BY MOUTH TWICE DAILY 90 tablet 3  . cholecalciferol (VITAMIN D) 1000 UNITS tablet Take 2,000 Units by mouth daily.    . ferrous sulfate 325 (65 FE) MG EC tablet Take 1 tablet (325 mg total) by mouth daily with breakfast. 30 tablet 3  . furosemide (LASIX) 40 MG tablet Take 1 tablet (40 mg total) by mouth 2 (two) times daily. 60 tablet 8  . ibuprofen (ADVIL,MOTRIN) 200 MG tablet Take 400 mg by mouth every 6 (six) hours as needed.    . insulin aspart (NOVOLOG) 100 UNIT/ML injection Injects up to 267 units of insulin daily via pump  DX E11.9 and Z96.41    . Insulin Human (INSULIN PUMP) SOLN Inject into the skin as directed.    . lidocaine (LIDODERM) 5 % Place 3 patches onto the skin daily. Remove & Discard patch within 12 hours or as directed by MD 90 patch 6  . lisinopril (PRINIVIL,ZESTRIL) 10 MG tablet Take 1 tablet (10 mg total) by  mouth 2 (two) times daily. 180 tablet 3  . metFORMIN (GLUCOPHAGE) 1000 MG tablet Take 1,000 mg by mouth 2 (two) times daily with a meal.    . Multiple Vitamin (MULTIVITAMIN WITH MINERALS) TABS Take 1 tablet by mouth every morning.     . potassium chloride SA (K-DUR,KLOR-CON) 20 MEQ tablet Take 1 tablet (20 mEq total) by mouth daily. 30 tablet 2  . promethazine-codeine (PHENERGAN WITH CODEINE) 6.25-10 MG/5ML syrup Take as directed  0  . rosuvastatin (CRESTOR) 20 MG tablet Take 20 mg by mouth every evening.     No current facility-administered medications for this visit.    Past Medical History  Diagnosis Date  . Diabetes mellitus   . High cholesterol   . Leukocytosis   . Anemia   . Obesity   . Sickle cell trait ([place])   . Hypertension     takes medicine to protect kidneys, does not have HTN  . Bronchitis   . Pneumonia   . Thymoma     Past Surgical History  Procedure Laterality Date  . Tonsillectomy    . Tubal ligation    . Mediastinotomy chamberlain mcneil Right 11/06/2012    Procedure: MEDIASTINOTOMY CHAMBERLAIN MCNEILPROCEDURE;  Surgeon: Gaye Pollack, MD;  Location: [facility];  Service: Thoracic;  Laterality: Right;  . Lymph node  biopsy Right 11/30/2012    Procedure: RIGHT TONSIL BIOPSY WITH FRESH FROZEN ANALYSIS;  Surgeon: Jodi Marble, MD;  Location: [facility];  Service: ENT;  Laterality: Right;  . Mediasternotomy N/A 01/12/2013    Procedure: MEDIAN STERNOTOMY;  Surgeon: Gaye Pollack, MD;  Location: [facility];  Service: Thoracic;  Laterality: N/A;  . Resection of a thymoma N/A 01/12/2013    Procedure: RESECTION OF A THYMOMA;  Surgeon: Gaye Pollack, MD;  Location: [facility];  Service: Thoracic;  Laterality: N/A;  . Left heart catheterization with coronary angiogram N/A 05/25/2013    Procedure: LEFT HEART CATHETERIZATION WITH CORONARY ANGIOGRAM;  Surgeon: Troy Sine, MD;  Location: [facility] CATH LAB;  Service: Cardiovascular;  Laterality: N/A;     ROS:  As stated in the HPI and negative  for all other systems.  PHYSICAL EXAM BP 102/74 mmHg  Pulse 94  Ht 5\' 9"  (1.753 m)  Wt 390 lb 4.8 oz (177.039 kg)  BMI 57.61 kg/m2  LMP 06/17/2012 GENERAL:  Well appearing HEENT:  Pupils equal round and reactive, fundi not visualized, oral mucosa unremarkable NECK:  No jugular venous distention, waveform within normal limits, carotid upstroke brisk and symmetric, no bruits, no thyromegaly LUNGS:  Clear to auscultation bilaterally CHEST:  Unremarkable HEART:  PMI not displaced or sustained,S1 and S2 within normal limits, no S3, no S4, no clicks, no rubs, no murmurs ABD:  Flat, positive bowel sounds normal in frequency in pitch, no bruits, no rebound, no guarding, no midline pulsatile mass, no hepatomegaly, no splenomegaly EXT:  2 plus pulses throughout, trace edema, no cyanosis no clubbing  EKG:  Sinus rhythm, rate 94, axis within normal limits, intervals within normal limits, no acute ST-T wave changes.  ASSESSMENT AND PLAN  DILATED CARDIOMYOPATHY:    She's on a good medical regimen at this point.  Her BP will not allow med titration.  Her EF is improved.  No change to meds is indicated.   PROLONGED QT:  This was noted on a previous EKG. However, this was normal on the EKG today.  No change in therapy is indicated.   OBESITY:  This is contributing to her symptoms.  We have discussed the weight loss clinic in [place].

## 2014-12-18 NOTE — Patient Instructions (Signed)
Your physician wants you to follow-up in: 1 Year. You will receive a reminder letter in the mail two months in advance. If you don't receive a letter, please call our office to schedule the follow-up appointment.  Merry Christmas and Happy New Year!!  

## 2014-12-23 ENCOUNTER — Other Ambulatory Visit: Payer: Self-pay | Admitting: Cardiology

## 2014-12-23 NOTE — Telephone Encounter (Signed)
Rx request sent to pharmacy.  

## 2015-02-10 ENCOUNTER — Emergency Department (HOSPITAL_COMMUNITY)
Admission: EM | Admit: 2015-02-10 | Discharge: 2015-02-10 | Disposition: A | Discharge status: Home / Self Care | Payer: BLUE CROSS/BLUE SHIELD | Attending: Emergency Medicine | Admitting: Emergency Medicine

## 2015-02-10 ENCOUNTER — Encounter (HOSPITAL_COMMUNITY): Payer: Self-pay | Admitting: Emergency Medicine

## 2015-02-10 ENCOUNTER — Emergency Department (HOSPITAL_COMMUNITY): Payer: BLUE CROSS/BLUE SHIELD

## 2015-02-10 ENCOUNTER — Emergency Department (HOSPITAL_COMMUNITY)
Admission: EM | Admit: 2015-02-10 | Discharge: 2015-02-10 | Disposition: A | Discharge status: Nursing Home (Includes ICF) | Payer: BLUE CROSS/BLUE SHIELD | Source: Home / Self Care | Attending: Family Medicine | Admitting: Family Medicine

## 2015-02-10 DIAGNOSIS — I10 Essential (primary) hypertension: Secondary | ICD-10-CM | POA: Insufficient documentation

## 2015-02-10 DIAGNOSIS — E119 Type 2 diabetes mellitus without complications: Secondary | ICD-10-CM | POA: Diagnosis not present

## 2015-02-10 DIAGNOSIS — Z7982 Long term (current) use of aspirin: Secondary | ICD-10-CM | POA: Diagnosis not present

## 2015-02-10 DIAGNOSIS — Z7984 Long term (current) use of oral hypoglycemic drugs: Secondary | ICD-10-CM | POA: Diagnosis not present

## 2015-02-10 DIAGNOSIS — E7800 Pure hypercholesterolemia, unspecified: Secondary | ICD-10-CM | POA: Insufficient documentation

## 2015-02-10 DIAGNOSIS — I5043 Acute on chronic combined systolic (congestive) and diastolic (congestive) heart failure: Secondary | ICD-10-CM | POA: Diagnosis not present

## 2015-02-10 DIAGNOSIS — Z880 Allergy status to penicillin: Secondary | ICD-10-CM | POA: Insufficient documentation

## 2015-02-10 DIAGNOSIS — J209 Acute bronchitis, unspecified: Secondary | ICD-10-CM | POA: Diagnosis not present

## 2015-02-10 DIAGNOSIS — E669 Obesity, unspecified: Secondary | ICD-10-CM | POA: Insufficient documentation

## 2015-02-10 DIAGNOSIS — Z794 Long term (current) use of insulin: Secondary | ICD-10-CM | POA: Diagnosis not present

## 2015-02-10 DIAGNOSIS — Z79899 Other long term (current) drug therapy: Secondary | ICD-10-CM | POA: Diagnosis not present

## 2015-02-10 DIAGNOSIS — Z9889 Other specified postprocedural states: Secondary | ICD-10-CM | POA: Insufficient documentation

## 2015-02-10 DIAGNOSIS — R079 Chest pain, unspecified: Secondary | ICD-10-CM | POA: Diagnosis present

## 2015-02-10 DIAGNOSIS — D649 Anemia, unspecified: Secondary | ICD-10-CM | POA: Insufficient documentation

## 2015-02-10 DIAGNOSIS — J40 Bronchitis, not specified as acute or chronic: Secondary | ICD-10-CM

## 2015-02-10 DIAGNOSIS — Z8701 Personal history of pneumonia (recurrent): Secondary | ICD-10-CM | POA: Insufficient documentation

## 2015-02-10 DIAGNOSIS — Z86018 Personal history of other benign neoplasm: Secondary | ICD-10-CM | POA: Insufficient documentation

## 2015-02-10 LAB — BASIC METABOLIC PANEL
ANION GAP: 10 (ref 5–15)
BUN: 15 mg/dL (ref 6–20)
CALCIUM: 9.5 mg/dL (ref 8.9–10.3)
CO2: 28 mmol/L (ref 22–32)
Chloride: 107 mmol/L (ref 101–111)
Creatinine, Ser: 1.04 mg/dL — ABNORMAL HIGH (ref 0.44–1.00)
Glucose, Bld: 106 mg/dL — ABNORMAL HIGH (ref 65–99)
Potassium: 4.3 mmol/L (ref 3.5–5.1)
Sodium: 145 mmol/L (ref 135–145)

## 2015-02-10 LAB — CBC
HCT: 36.2 % (ref 36.0–46.0)
HEMOGLOBIN: 11.9 g/dL — AB (ref 12.0–15.0)
MCH: 23.9 pg — ABNORMAL LOW (ref 26.0–34.0)
MCHC: 32.9 g/dL (ref 30.0–36.0)
MCV: 72.8 fL — ABNORMAL LOW (ref 78.0–100.0)
Platelets: 296 10*3/uL (ref 150–400)
RBC: 4.97 MIL/uL (ref 3.87–5.11)
RDW: 16.8 % — ABNORMAL HIGH (ref 11.5–15.5)
WBC: 10.7 10*3/uL — AB (ref 4.0–10.5)

## 2015-02-10 LAB — I-STAT TROPONIN, ED: Troponin i, poc: 0.01 ng/mL (ref 0.00–0.08)

## 2015-02-10 LAB — PROTIME-INR
INR: 1.03 (ref 0.00–1.49)
Prothrombin Time: 13.7 seconds (ref 11.6–15.2)

## 2015-02-10 LAB — BRAIN NATRIURETIC PEPTIDE: B NATRIURETIC PEPTIDE 5: 70.5 pg/mL (ref 0.0–100.0)

## 2015-02-10 MED ORDER — AZITHROMYCIN 250 MG PO TABS: ORAL_TABLET | ORAL | Status: DC

## 2015-02-10 MED ORDER — IBUPROFEN 800 MG PO TABS
800.0000 mg | ORAL_TABLET | Freq: Once | ORAL | Status: AC
  Administered 2015-02-10: 800 mg via ORAL
  Filled 2015-02-10: qty 1

## 2015-02-10 MED ORDER — IPRATROPIUM-ALBUTEROL 0.5-2.5 (3) MG/3ML IN SOLN
3.0000 mL | Freq: Once | RESPIRATORY_TRACT | Status: AC
Start: 2015-02-10 — End: 2015-02-10
  Administered 2015-02-10: 3 mL via RESPIRATORY_TRACT
  Filled 2015-02-10: qty 3

## 2015-02-10 MED ORDER — ALBUTEROL SULFATE HFA 108 (90 BASE) MCG/ACT IN AERS: 1.0000 | INHALATION_SPRAY | Freq: Four times a day (QID) | RESPIRATORY_TRACT | Status: DC | PRN

## 2015-02-10 MED ORDER — CYCLOBENZAPRINE HCL 10 MG PO TABS
5.0000 mg | ORAL_TABLET | Freq: Once | ORAL | Status: AC
  Administered 2015-02-10: 5 mg via ORAL
  Filled 2015-02-10: qty 1

## 2015-02-10 MED ORDER — CYCLOBENZAPRINE HCL 5 MG PO TABS: 5.0000 mg | ORAL_TABLET | Freq: Three times a day (TID) | ORAL | Status: DC | PRN

## 2015-02-10 NOTE — Discharge Instructions (Signed)
Use albuterol every 4 hrs as needed.   Take zpack as prescribed.   Take motrin for pain and flexeril for muscle spasms.   See your doctor.   Return to ER if you have worse chest pain, shortness of breath.

## 2015-02-10 NOTE — ED Provider Notes (Addendum)
CSN: [id number]     Arrival date & time 02/10/15  1934 History   First MD Initiated Contact with Patient 02/10/15 2126     Chief Complaint  Patient presents with  . Chest Pain     (Consider location/radiation/quality/duration/timing/severity/associated sxs/prior Treatment) The history is provided by the patient.  Kaitlin [name] is a 50 y.o. female hx of DM, anemia, HTN, sickle cell trait, type 1 diastolic heart failure here with chest pain, shortness of breath.  Patient has been having nonproductive cough for the last 3 days.  Patient states that she has chest pain when she coughs.  That any fevers but does have some subjective chills.  Denies any vomiting.  Denies any leg swelling.  She went to urgent care and sent here because of history of CHF.    Past Medical History  Diagnosis Date  . Diabetes mellitus   . High cholesterol   . Leukocytosis   . Anemia   . Obesity   . Sickle cell trait ([place])   . Hypertension     takes medicine to protect kidneys, does not have HTN  . Bronchitis   . Pneumonia   . Thymoma    Past Surgical History  Procedure Laterality Date  . Tonsillectomy    . Tubal ligation    . Mediastinotomy chamberlain mcneil Right 11/06/2012    Procedure: MEDIASTINOTOMY CHAMBERLAIN MCNEILPROCEDURE;  Surgeon: Gaye Pollack, MD;  Location: [place];  Service: Thoracic;  Laterality: Right;  . Lymph node biopsy Right 11/30/2012    Procedure: RIGHT TONSIL BIOPSY WITH FRESH FROZEN ANALYSIS;  Surgeon: Jodi Marble, MD;  Location: [place];  Service: ENT;  Laterality: Right;  . Mediasternotomy N/A 01/12/2013    Procedure: MEDIAN STERNOTOMY;  Surgeon: Gaye Pollack, MD;  Location: [place];  Service: Thoracic;  Laterality: N/A;  . Resection of a thymoma N/A 01/12/2013    Procedure: RESECTION OF A THYMOMA;  Surgeon: Gaye Pollack, MD;  Location: [place];  Service: Thoracic;  Laterality: N/A;  . Left heart catheterization with coronary angiogram N/A 05/25/2013    Procedure: LEFT HEART  CATHETERIZATION WITH CORONARY ANGIOGRAM;  Surgeon: Troy Sine, MD;  Location: [facility] CATH LAB;  Service: Cardiovascular;  Laterality: N/A;   Family History  Problem Relation Age of Onset  . Heart disease Father     No details  . Diabetes type II      Family HX  . Breast cancer     Social History  Substance Use Topics  . Smoking status: Passive Smoke Exposure - Never Smoker  . Smokeless tobacco: Never Used  . Alcohol Use: No   OB History    No data available     Review of Systems  Respiratory: Positive for cough.   Cardiovascular: Positive for chest pain.  All other systems reviewed and are negative.     Allergies  Amoxicillin-pot clavulanate  Home Medications   Prior to Admission medications   Medication Sig Start Date End Date Taking? Authorizing Provider  albuterol (PROVENTIL HFA;VENTOLIN HFA) 108 (90 BASE) MCG/ACT inhaler Inhale 1-2 puffs into the lungs every 6 (six) hours as needed for wheezing or shortness of breath.   Yes Historical Provider, MD  aspirin EC 325 MG tablet Take 325 mg by mouth daily.   Yes Historical Provider, MD  carvedilol (COREG) 25 MG tablet TAKE 1 BY MOUTH TWICE DAILY 07/30/14  Yes Minus Breeding, MD  cholecalciferol (VITAMIN D) 1000 UNITS tablet Take 2,000 Units by mouth daily.  Yes Historical Provider, MD  ferrous sulfate 325 (65 FE) MG EC tablet Take 1 tablet (325 mg total) by mouth daily with breakfast. 09/25/13  Yes Wyatt Portela, MD  furosemide (LASIX) 40 MG tablet Take 1 tablet (40 mg total) by mouth 2 (two) times daily. 10/04/14  Yes Minus Breeding, MD  ibuprofen (ADVIL,MOTRIN) 200 MG tablet Take 400 mg by mouth every 6 (six) hours as needed.   Yes Historical Provider, MD  Insulin Human (INSULIN PUMP) SOLN Inject 1 each into the skin continuous. "novolog"   Yes Historical Provider, MD  lisinopril (PRINIVIL,ZESTRIL) 10 MG tablet TAKE 1 BY MOUTH TWICE DAILY 12/23/14  Yes Minus Breeding, MD  metFORMIN (GLUCOPHAGE) 1000 MG tablet Take 1,000 mg  by mouth 2 (two) times daily with a meal.   Yes Historical Provider, MD  Multiple Vitamin (MULTIVITAMIN WITH MINERALS) TABS Take 1 tablet by mouth every morning.    Yes Historical Provider, MD  Phenylephrine-APAP-Guaifenesin [facility]) [phone] MG/20ML LIQD Take 2 capsules by mouth daily.   Yes Historical Provider, MD  potassium chloride SA (K-DUR,KLOR-CON) 20 MEQ tablet Take 1 tablet (20 mEq total) by mouth daily. 10/29/13  Yes Minus Breeding, MD  rosuvastatin (CRESTOR) 20 MG tablet Take 20 mg by mouth every evening. 12/13/14 12/13/15 Yes Historical Provider, MD  lidocaine (LIDODERM) 5 % Place 3 patches onto the skin daily. Remove & Discard patch within 12 hours or as directed by MD 11/18/14   Melvenia Beam, MD   BP 121/74 mmHg  Pulse 87  Temp(Src) 98.5 F (36.9 C) (Oral)  Resp 18  Ht 5\' 8"  (1.727 m)  Wt 392 lb (177.81 kg)  BMI 59.62 kg/m2  SpO2 96%  LMP 06/17/2012 Physical Exam  Constitutional: She is oriented to person, place, and time. She appears well-developed.  Coughing, obese   HENT:  Head: Normocephalic.  Mouth/Throat: Oropharynx is clear and moist.  Eyes: Conjunctivae are normal. Pupils are equal, round, and reactive to light.  Neck: Normal range of motion. Neck supple.  Cardiovascular: Normal rate, regular rhythm and normal heart sounds.   Pulmonary/Chest: Effort normal.  Minimal wheezing throughout, no retractions. + reproducible sternal tenderness   Abdominal: Soft. Bowel sounds are normal. She exhibits no distension. There is no tenderness. There is no rebound.  Musculoskeletal: Normal range of motion. She exhibits no edema or tenderness.  Neurological: She is alert and oriented to person, place, and time.  Skin: Skin is warm and dry.  Psychiatric: She has a normal mood and affect. Her behavior is normal. Judgment and thought content normal.  Nursing note and vitals reviewed.   ED Course  Procedures (including critical care time) Labs Review Labs  Reviewed  BASIC METABOLIC PANEL - Abnormal; Notable for the following:    Glucose, Bld 106 (*)    Creatinine, Ser 1.04 (*)    All other components within normal limits  CBC - Abnormal; Notable for the following:    WBC 10.7 (*)    Hemoglobin 11.9 (*)    MCV 72.8 (*)    MCH 23.9 (*)    RDW 16.8 (*)    All other components within normal limits  BRAIN NATRIURETIC PEPTIDE  PROTIME-INR  I-STAT TROPOININ, ED    Imaging Review Dg Chest 2 View  02/10/2015  CLINICAL DATA:  50 year old presenting with 4 day history of cough and progressively worsening chest pain and shortness of breath the began yesterday. 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 EXAM: CHEST  2 VIEW COMPARISON:  CT chest 10/23/2014 and earlier. Chest x-rays 01/08/2014 and earlier. FINDINGS: Sternotomy. Cardiomediastinal silhouette unremarkable, unchanged. Linear scarring in the right middle lobe. Lungs otherwise clear. No localized airspace consolidation. No pleural effusions. No pneumothorax. Normal pulmonary vascularity. Mild degenerative changes involving the thoracic spine. IMPRESSION: No acute cardiopulmonary disease. Stable scarring in the right middle lobe. Electronically Signed   By: Evangeline Dakin M.D.   On: 02/10/2015 20:26   I have personally reviewed and evaluated these images and lab results as part of my medical decision-making.   EKG Interpretation   Date/Time:  Monday February 10 2015 19:41:48 EST Ventricular Rate:  95 PR Interval:  136 QRS Duration: 84 QT Interval:  356 QTC Calculation: 447 R Axis:   -16 Text Interpretation:  Normal sinus rhythm Low voltage QRS Cannot rule out  Anterior infarct , age undetermined Abnormal ECG No significant change  since last tracing Confirmed by Anasofia Micallef  MD, Amberlynn Tempesta ([id number]) on 02/10/2015  9:27:11 PM      MDM   Final diagnoses:  None   Kaitlin [name] is a 50 y.o. female here with cough, wheezing. Likely bronchitis with MSK pain. Given hx of CHF and some  shortness of breath, will get CXR and BNP. Not concerned about PE. Will give nebs and reassess. I doubt ACS.   10:58 PM No wheezing now. BNP nl. Labs unremarkable. Vitals stable. CXR clear. Will dc home with albuterol, zpack.      Wandra Arthurs, MD 02/10/15 2258  Wandra Arthurs, MD 02/10/15 [id number]

## 2015-02-10 NOTE — ED Notes (Signed)
PT instructed to not drive or operate machinery for the rest of the night or while using flexiril for pain

## 2015-02-10 NOTE — ED Provider Notes (Signed)
CSN: [id number]     Arrival date & time 02/10/15  1905 History   First MD Initiated Contact with Patient 02/10/15 1920     Chief Complaint  Patient presents with  . Cough  . Pleurisy  . Weakness   (Consider location/radiation/quality/duration/timing/severity/associated sxs/prior Treatment) Patient is a 50 y.o. female presenting with cough. The history is provided by the patient.  Cough Cough characteristics:  Non-productive and dry Severity:  Moderate Onset quality:  Gradual Duration:  4 days Progression:  Worsening Chronicity:  Recurrent Smoker: no   Associated symptoms: diaphoresis and shortness of breath   Associated symptoms: no fever     Past Medical History  Diagnosis Date  . Diabetes mellitus   . High cholesterol   . Leukocytosis   . Anemia   . Obesity   . Sickle cell trait ([place])   . Hypertension     takes medicine to protect kidneys, does not have HTN  . Bronchitis   . Pneumonia   . Thymoma    Past Surgical History  Procedure Laterality Date  . Tonsillectomy    . Tubal ligation    . Mediastinotomy chamberlain mcneil Right 11/06/2012    Procedure: MEDIASTINOTOMY CHAMBERLAIN MCNEILPROCEDURE;  Surgeon: Gaye Pollack, MD;  Location: [place];  Service: Thoracic;  Laterality: Right;  . Lymph node biopsy Right 11/30/2012    Procedure: RIGHT TONSIL BIOPSY WITH FRESH FROZEN ANALYSIS;  Surgeon: Jodi Marble, MD;  Location: [place];  Service: ENT;  Laterality: Right;  . Mediasternotomy N/A 01/12/2013    Procedure: MEDIAN STERNOTOMY;  Surgeon: Gaye Pollack, MD;  Location: [place];  Service: Thoracic;  Laterality: N/A;  . Resection of a thymoma N/A 01/12/2013    Procedure: RESECTION OF A THYMOMA;  Surgeon: Gaye Pollack, MD;  Location: [place];  Service: Thoracic;  Laterality: N/A;  . Left heart catheterization with coronary angiogram N/A 05/25/2013    Procedure: LEFT HEART CATHETERIZATION WITH CORONARY ANGIOGRAM;  Surgeon: Troy Sine, MD;  Location: [facility] CATH LAB;   Service: Cardiovascular;  Laterality: N/A;   Family History  Problem Relation Age of Onset  . Heart disease Father     No details  . Diabetes type II      Family HX  . Breast cancer     Social History  Substance Use Topics  . Smoking status: Passive Smoke Exposure - Never Smoker  . Smokeless tobacco: Never Used  . Alcohol Use: No   OB History    No data available     Review of Systems  Constitutional: Positive for diaphoresis and activity change. Negative for fever.  Respiratory: Positive for cough, chest tightness and shortness of breath.   Cardiovascular: Positive for leg swelling.  All other systems reviewed and are negative.   Allergies  Amoxicillin-pot clavulanate  Home Medications   Prior to Admission medications   Medication Sig Start Date End Date Taking? Authorizing Provider  acetaminophen (TYLENOL) 500 MG tablet Take 1,000 mg by mouth daily as needed (pain).     Historical Provider, MD  albuterol (PROVENTIL HFA;VENTOLIN HFA) 108 (90 BASE) MCG/ACT inhaler Inhale 1-2 puffs into the lungs every 6 (six) hours as needed for wheezing or shortness of breath.    Historical Provider, MD  aspirin EC 325 MG tablet Take 325 mg by mouth daily.    Historical Provider, MD  carvedilol (COREG) 25 MG tablet TAKE 1 BY MOUTH TWICE DAILY 07/30/14   Minus Breeding, MD  cholecalciferol (VITAMIN D) 1000 UNITS  tablet Take 2,000 Units by mouth daily.    Historical Provider, MD  ferrous sulfate 325 (65 FE) MG EC tablet Take 1 tablet (325 mg total) by mouth daily with breakfast. 09/25/13   Wyatt Portela, MD  furosemide (LASIX) 40 MG tablet Take 1 tablet (40 mg total) by mouth 2 (two) times daily. 10/04/14   Minus Breeding, MD  ibuprofen (ADVIL,MOTRIN) 200 MG tablet Take 400 mg by mouth every 6 (six) hours as needed.    Historical Provider, MD  insulin aspart (NOVOLOG) 100 UNIT/ML injection Injects up to 267 units of insulin daily via pump  DX E11.9 and Z96.41 08/29/14 08/30/15  Historical  Provider, MD  Insulin Human (INSULIN PUMP) SOLN Inject into the skin as directed.    Historical Provider, MD  lidocaine (LIDODERM) 5 % Place 3 patches onto the skin daily. Remove & Discard patch within 12 hours or as directed by MD 11/18/14   Melvenia Beam, MD  lisinopril (PRINIVIL,ZESTRIL) 10 MG tablet TAKE 1 BY MOUTH TWICE DAILY 12/23/14   Minus Breeding, MD  metFORMIN (GLUCOPHAGE) 1000 MG tablet Take 1,000 mg by mouth 2 (two) times daily with a meal.    Historical Provider, MD  Multiple Vitamin (MULTIVITAMIN WITH MINERALS) TABS Take 1 tablet by mouth every morning.     Historical Provider, MD  potassium chloride SA (K-DUR,KLOR-CON) 20 MEQ tablet Take 1 tablet (20 mEq total) by mouth daily. 10/29/13   Minus Breeding, MD  promethazine-codeine [facility] WITH CODEINE) 6.25-10 MG/5ML syrup Take as directed 10/07/14   Historical Provider, MD  rosuvastatin (CRESTOR) 20 MG tablet Take 20 mg by mouth every evening. 12/13/14 12/13/15  Historical Provider, MD   Meds Ordered and Administered this Visit  Medications - No data to display  BP 137/73 mmHg  Pulse 98  Temp(Src) 99.2 F (37.3 C) (Oral)  Resp 20  SpO2 99%  LMP 06/17/2012 No data found.   Physical Exam  Constitutional: She is oriented to person, place, and time. She appears well-developed and well-nourished.  Neck: Normal range of motion. Neck supple.  Cardiovascular: Regular rhythm.  Tachycardia present.  Exam reveals decreased pulses.   No murmur heard. Pulmonary/Chest: She has no wheezes. She has rales.  Neurological: She is alert and oriented to person, place, and time.  Skin: Skin is warm and dry.  Nursing note and vitals reviewed.   ED Course  Procedures (including critical care time)  Labs Review Labs Reviewed - No data to display  Imaging Review No results found.   Visual Acuity Review  Right Eye Distance:   Left Eye Distance:   Bilateral Distance:    Right Eye Near:   Left Eye Near:    Bilateral Near:          MDM   1. CHF (congestive heart failure), NYHA class I, acute on chronic, combined ([place])    Sent for eval of prob chf, sees dr hochrein-card. Sx for sev days.    Billy Fischer, MD 02/10/15 [phone]

## 2015-02-10 NOTE — ED Notes (Signed)
Pt. reports right chest pain with SOB , productive cough , exertional dyspnea , fatigue  and nausea onset last Friday , denies emesis or diaphoresis . Seen at urgent care this evening sent here for further evaluation .

## 2015-02-10 NOTE — ED Notes (Signed)
The patient presented to the [facility] with a complaint of chest wall pain and shortness of breath along with weakness. The patient stated that she has a hx of CHF.

## 2015-04-10 ENCOUNTER — Other Ambulatory Visit: Payer: BLUE CROSS/BLUE SHIELD

## 2015-04-10 ENCOUNTER — Telehealth: Payer: Self-pay | Admitting: Oncology

## 2015-04-10 NOTE — Telephone Encounter (Signed)
pt called to r/s appt...done....pt ok and aware of new d.t °

## 2015-04-14 ENCOUNTER — Other Ambulatory Visit (HOSPITAL_BASED_OUTPATIENT_CLINIC_OR_DEPARTMENT_OTHER): Payer: BLUE CROSS/BLUE SHIELD

## 2015-04-14 ENCOUNTER — Ambulatory Visit (HOSPITAL_COMMUNITY)
Admission: RE | Admit: 2015-04-14 | Discharge: 2015-04-14 | Disposition: A | Discharge status: Home / Self Care | Payer: BLUE CROSS/BLUE SHIELD | Source: Ambulatory Visit | Attending: Oncology | Admitting: Oncology

## 2015-04-14 DIAGNOSIS — D72829 Elevated white blood cell count, unspecified: Secondary | ICD-10-CM | POA: Diagnosis not present

## 2015-04-14 DIAGNOSIS — D500 Iron deficiency anemia secondary to blood loss (chronic): Secondary | ICD-10-CM | POA: Diagnosis not present

## 2015-04-14 DIAGNOSIS — D4989 Neoplasm of unspecified behavior of other specified sites: Secondary | ICD-10-CM

## 2015-04-14 DIAGNOSIS — D150 Benign neoplasm of thymus: Secondary | ICD-10-CM | POA: Diagnosis not present

## 2015-04-14 DIAGNOSIS — R918 Other nonspecific abnormal finding of lung field: Secondary | ICD-10-CM | POA: Diagnosis not present

## 2015-04-14 LAB — COMPREHENSIVE METABOLIC PANEL
ALBUMIN: 3.1 g/dL — AB (ref 3.5–5.0)
ALK PHOS: 78 U/L (ref 40–150)
ALT: 20 U/L (ref 0–55)
AST: 20 U/L (ref 5–34)
Anion Gap: 6 mEq/L (ref 3–11)
BILIRUBIN TOTAL: 0.38 mg/dL (ref 0.20–1.20)
BUN: 19 mg/dL (ref 7.0–26.0)
CALCIUM: 9.4 mg/dL (ref 8.4–10.4)
CO2: 27 mEq/L (ref 22–29)
Chloride: 112 mEq/L — ABNORMAL HIGH (ref 98–109)
Creatinine: 1 mg/dL (ref 0.6–1.1)
EGFR: 76 mL/min/{1.73_m2} — AB (ref >90)
GLUCOSE: 153 mg/dL — AB (ref 70–140)
Potassium: 4.2 mEq/L (ref 3.5–5.1)
SODIUM: 145 meq/L (ref 136–145)
TOTAL PROTEIN: 6.2 g/dL — AB (ref 6.4–8.3)

## 2015-04-14 LAB — CBC WITH DIFFERENTIAL/PLATELET
BASO%: 0.2 % (ref 0.0–2.0)
BASOS ABS: 0 10*3/uL (ref 0.0–0.1)
EOS ABS: 0 10*3/uL (ref 0.0–0.5)
EOS%: 0 % (ref 0.0–7.0)
HEMATOCRIT: 35.4 % (ref 34.8–46.6)
HEMOGLOBIN: 11.4 g/dL — AB (ref 11.6–15.9)
LYMPH#: 4.4 10*3/uL — AB (ref 0.9–3.3)
LYMPH%: 37.6 % (ref 14.0–49.7)
MCH: 24.1 pg — ABNORMAL LOW (ref 25.1–34.0)
MCHC: 32.3 g/dL (ref 31.5–36.0)
MCV: 74.5 fL — AB (ref 79.5–101.0)
MONO#: 0.6 10*3/uL (ref 0.1–0.9)
MONO%: 5.1 % (ref 0.0–14.0)
NEUT%: 57.1 % (ref 38.4–76.8)
NEUTROS ABS: 6.6 10*3/uL — AB (ref 1.5–6.5)
PLATELETS: 288 10*3/uL (ref 145–400)
RBC: 4.75 10*6/uL (ref 3.70–5.45)
RDW: 17.7 % — AB (ref 11.2–14.5)
WBC: 11.6 10*3/uL — AB (ref 3.9–10.3)

## 2015-04-14 LAB — IRON AND TIBC
%SAT: 16 % — ABNORMAL LOW (ref 21–57)
Iron: 45 ug/dL (ref 41–142)
TIBC: 284 ug/dL (ref 236–444)
UIBC: 238 ug/dL (ref 120–384)

## 2015-04-14 LAB — FERRITIN: FERRITIN: 53 ng/mL (ref 9–269)

## 2015-04-14 MED ORDER — IOPAMIDOL (ISOVUE-300) INJECTION 61%: 75.0000 mL | Freq: Once | INTRAVENOUS | Status: DC | PRN

## 2015-04-16 ENCOUNTER — Ambulatory Visit (HOSPITAL_BASED_OUTPATIENT_CLINIC_OR_DEPARTMENT_OTHER): Payer: BLUE CROSS/BLUE SHIELD | Admitting: Oncology

## 2015-04-16 ENCOUNTER — Telehealth: Payer: Self-pay | Admitting: Oncology

## 2015-04-16 ENCOUNTER — Telehealth: Payer: Self-pay | Admitting: *Deleted

## 2015-04-16 VITALS — BP 128/60 | HR 70 | Temp 98.7°F | Resp 20 | Ht 68.0 in | Wt 395.7 lb

## 2015-04-16 DIAGNOSIS — D509 Iron deficiency anemia, unspecified: Secondary | ICD-10-CM

## 2015-04-16 DIAGNOSIS — C37 Malignant neoplasm of thymus: Secondary | ICD-10-CM

## 2015-04-16 DIAGNOSIS — C781 Secondary malignant neoplasm of mediastinum: Secondary | ICD-10-CM

## 2015-04-16 DIAGNOSIS — D508 Other iron deficiency anemias: Secondary | ICD-10-CM

## 2015-04-16 MED ORDER — AZITHROMYCIN 250 MG PO TABS: ORAL_TABLET | ORAL | Status: DC

## 2015-04-16 MED ORDER — PHENYLEPHRINE-APAP-GUAIFENESIN 10-650-400 MG/20ML PO LIQD: 2.0000 | Freq: Every day | ORAL | Status: DC

## 2015-04-16 NOTE — Telephone Encounter (Signed)
per pof to sch pt appt-gave pt copy of avs °

## 2015-04-16 NOTE — Telephone Encounter (Signed)
Missy with the [facility] called in reference to a "referral faxed to them today at 11:45 am for Worthy Rancher NP at 11:45 am with office notes from  the [facility]  She no longer works with [place] and we do not know where she is."  Will notify provider.  Collaborative reports she has not faxed anything on this patient who is here today for F/U.

## 2015-04-16 NOTE — Progress Notes (Signed)
Hematology and Oncology Follow Up Visit  Kaitlin [name] [id number] 1965/07/13 50 y.o. 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 She presented with a large mediastinal mass and shortness of breath.  Past therapy: She is status post median sternotomy with resection of the thymoma on 01/12/13.   Current therapy: Observation and surveillance.  Interim History: Kaitlin [name] presents today for a follow up visit.  Since her last visit, she reports complaints related to sinus congestion and pain. She has reported nasal drip that is causing her nighttime cough and wheezing at times. She denied any fevers or difficulty breathing. Her activity level seems to be improving and has not reported any chest pain or discomfort. She continues to be on iron supplements without any intolerance or dyspepsia. She reports that she misses certain days when she forgets to take it.  She is not reporting any headaches or blurry vision or syncope. She does not report any palpitation or orthopnea. She does not report any nausea or vomiting or abdominal pain. She has not reported any fevers or chills or sweats. She does not report any frequency urgency or hesitancy. Her rest or view of system is unremarkable.  Medications: I have reviewed the patient's current medications.  Current Outpatient Prescriptions  Medication Sig Dispense Refill  . albuterol (PROVENTIL HFA;VENTOLIN HFA) 108 (90 Base) MCG/ACT inhaler Inhale 1-2 puffs into the lungs every 6 (six) hours as needed for wheezing or shortness of breath. 1 Inhaler 0  . aspirin EC 325 MG tablet Take 325 mg by mouth daily.    . carvedilol (COREG) 25 MG tablet TAKE 1 BY MOUTH TWICE DAILY 90 tablet 3  . cholecalciferol (VITAMIN D) 1000 UNITS tablet Take 2,000 Units by mouth daily.    . cyclobenzaprine (FLEXERIL) 5 MG tablet Take 1 tablet (5 mg total) by mouth 3 (three) times daily as needed for muscle spasms. 20 tablet 0   . ferrous sulfate 325 (65 FE) MG EC tablet Take 1 tablet (325 mg total) by mouth daily with breakfast. 30 tablet 3  . furosemide (LASIX) 40 MG tablet Take 1 tablet (40 mg total) by mouth 2 (two) times daily. 60 tablet 8  . ibuprofen (ADVIL,MOTRIN) 200 MG tablet Take 400 mg by mouth every 6 (six) hours as needed.    . Insulin Human (INSULIN PUMP) SOLN Inject 1 each into the skin continuous. "novolog"    . lidocaine (LIDODERM) 5 % Place 3 patches onto the skin daily. Remove & Discard patch within 12 hours or as directed by MD 90 patch 6  . lisinopril (PRINIVIL,ZESTRIL) 10 MG tablet TAKE 1 BY MOUTH TWICE DAILY 180 tablet 3  . metFORMIN (GLUCOPHAGE) 1000 MG tablet Take 1,000 mg by mouth 2 (two) times daily with a meal.    . Multiple Vitamin (MULTIVITAMIN WITH MINERALS) TABS Take 1 tablet by mouth every morning.     Marland Kitchen Phenylephrine-APAP-Guaifenesin (MUCINEX SINUS-MAX) 10-650-400 MG/20ML LIQD Take 2 capsules by mouth daily. 237 mL 0  . potassium chloride SA (K-DUR,KLOR-CON) 20 MEQ tablet Take 1 tablet (20 mEq total) by mouth daily. 30 tablet 2  . rosuvastatin (CRESTOR) 20 MG tablet Take 20 mg by mouth every evening.    Marland Kitchen azithromycin (ZITHROMAX) 250 MG tablet Take two tabs on day one. Daily for the next four days after. 6 each 0   No current facility-administered medications for this visit.    Allergies:  Allergies  Allergen Reactions  .  Amoxicillin-Pot Clavulanate Diarrhea     Physical Exam: Blood pressure 128/60, pulse 70, temperature 98.7 F (37.1 C), temperature source Oral, resp. rate 20, height 5\' 8"  (1.727 m), weight 395 lb 11.2 oz (179.488 kg), last menstrual period 06/17/2012, SpO2 99 %. ECOG: 1 General appearance: alert awake. Without distress. Head: Normocephalic, without obvious abnormality, atraumatic.  No oral thrush noted. Sinus tenderness noted on palpation. Neck: no adenopathy.  Lymph nodes: Cervical, supraclavicular, and axillary nodes normal. Heart:regular rate and  rhythm, S1, S2 normal, no murmur, click, rub or gallop Lung:chest clear, no wheezing, rales, normal symmetric air entry Abdomin: soft, non-tender, without masses or organomegaly EXT:no erythema, induration, or nodules. Trace edema noted.   Lab Results: Lab Results  Component Value Date   WBC 11.6* 04/14/2015   HGB 11.4* 04/14/2015   HCT 35.4 04/14/2015   MCV 74.5* 04/14/2015   PLT 288 04/14/2015     Chemistry      Component Value Date/Time   NA 145 04/14/2015 1142   NA 145 02/10/2015 2011   NA 144 10/31/2014 1429   K 4.2 04/14/2015 1142   K 4.3 02/10/2015 2011   CL 107 02/10/2015 2011   CL 108* 03/29/2012 0945   CO2 27 04/14/2015 1142   CO2 28 02/10/2015 2011   BUN 19.0 04/14/2015 1142   BUN 15 02/10/2015 2011   BUN 17 10/31/2014 1429   CREATININE 1.0 04/14/2015 1142   CREATININE 1.04* 02/10/2015 2011   CREATININE 0.85 05/30/2013 0943      Component Value Date/Time   CALCIUM 9.4 04/14/2015 1142   CALCIUM 9.5 02/10/2015 2011   ALKPHOS 78 04/14/2015 1142   ALKPHOS 88 10/31/2014 1429   AST 20 04/14/2015 1142   AST 20 10/31/2014 1429   ALT 20 04/14/2015 1142   ALT 17 10/31/2014 1429   BILITOT 0.38 04/14/2015 1142   BILITOT 0.2 10/31/2014 1429   BILITOT 0.4 01/09/2014 0248       EXAM: CT CHEST WITH CONTRAST  TECHNIQUE: Multidetector CT imaging of the chest was performed during intravenous contrast administration.  CONTRAST: 75 mL Isovue 370 IV  COMPARISON: CT chest dated 10/23/2014  FINDINGS: Mediastinum/Nodes: The heart is normal in size. No pericardial effusion.  Small mediastinal lymph nodes, including a 7 mm short axis right paratracheal node (series 2/image 34) and an 11 mm short axis subcarinal node (series 2/ image 43), unchanged.  Postsurgical changes related to thymic resection. No residual or recurrent anterior mediastinal mass.  Visualized thyroid is notable for a 15 mm short axis left isthmic nodule (series 2/image  11).  Lungs/Pleura: Evaluation of the lung parenchyma is constrained by respiratory motion.  Stable 5 x 4 mm right middle lobe nodule along the minor fissure (series 5/ image 46).  Stable 3-4 mm left lower lobe nodule (series 5/image 61), although motion degraded.  No focal consolidation.  No pleural effusion or pneumothorax.  Upper abdomen: Visualized upper abdomen is grossly unremarkable.  Musculoskeletal: Mild degenerative changes of the visualized thoracolumbar spine.  Status post median sternotomy.  IMPRESSION: Postsurgical changes related to thymic resection. No residual or recurrent anterior mediastinal mass.  Stable 5 x 4 mm right middle lobe nodule along the minor fissure. No findings specific for metastatic disease.  Impression and Plan:  50 year old woman with the following issues:  1. 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 Her pathological staging was T2 N0. She did have a focally involved margin and radiation was deferred for salvage purposes.  CT scan from April 2017 was reviewed today and showed no evidence of recurrence. The plan is to continue with active surveillance and you salvage radiation therapy upon recurrence.  2. Microcytosis and mild anemia: likely due to mild iron deficiency anemia. She is currently on iron supplements and her hemoglobin is close to normal.  Continue to recommend continuing oral iron for the time being with better adherence.  3. Sinusitis and cough: It appears to be bacterial in nature and will will prescribe a Z-Pak as well as cough suppressant for better symptom management.  4. Follow-up: Will be in 6 months to repeat iron studies.   [facility], MD 4/5/201710:53 AM

## 2015-04-30 ENCOUNTER — Encounter (HOSPITAL_COMMUNITY): Payer: Self-pay | Admitting: Emergency Medicine

## 2015-04-30 ENCOUNTER — Ambulatory Visit (HOSPITAL_COMMUNITY)
Admission: EM | Admit: 2015-04-30 | Discharge: 2015-04-30 | Disposition: A | Discharge status: Home / Self Care | Payer: BLUE CROSS/BLUE SHIELD | Attending: Family Medicine | Admitting: Family Medicine

## 2015-04-30 DIAGNOSIS — J301 Allergic rhinitis due to pollen: Secondary | ICD-10-CM | POA: Diagnosis not present

## 2015-04-30 MED ORDER — TRIAMCINOLONE ACETONIDE 40 MG/ML IJ SUSP
40.0000 mg | Freq: Once | INTRAMUSCULAR | Status: AC
  Administered 2015-04-30: 40 mg via INTRAMUSCULAR

## 2015-04-30 MED ORDER — METHYLPREDNISOLONE ACETATE 80 MG/ML IJ SUSP
INTRAMUSCULAR | Status: AC
  Filled 2015-04-30: qty 1

## 2015-04-30 MED ORDER — MONTELUKAST SODIUM 10 MG PO TABS: 10.0000 mg | ORAL_TABLET | Freq: Every day | ORAL | Status: DC

## 2015-04-30 MED ORDER — TRIAMCINOLONE ACETONIDE 40 MG/ML IJ SUSP
INTRAMUSCULAR | Status: AC
  Filled 2015-04-30: qty 1

## 2015-04-30 MED ORDER — FEXOFENADINE HCL 180 MG PO TABS: 180.0000 mg | ORAL_TABLET | Freq: Every day | ORAL | Status: DC

## 2015-04-30 MED ORDER — FLUTICASONE PROPIONATE 50 MCG/ACT NA SUSP: 1.0000 | Freq: Two times a day (BID) | NASAL | Status: DC

## 2015-04-30 MED ORDER — METHYLPREDNISOLONE ACETATE 40 MG/ML IJ SUSP
80.0000 mg | Freq: Once | INTRAMUSCULAR | Status: AC
  Administered 2015-04-30: 80 mg via INTRAMUSCULAR

## 2015-04-30 NOTE — ED Provider Notes (Addendum)
CSN: [id number]     Arrival date & time 04/30/15  1353 History   First MD Initiated Contact with Patient 04/30/15 1518     Chief Complaint  Patient presents with  . URI   (Consider location/radiation/quality/duration/timing/severity/associated sxs/prior Treatment) Patient is a 50 y.o. female presenting with URI. The history is provided by the patient.  URI Presenting symptoms: congestion, cough, rhinorrhea and sore throat   Presenting symptoms: no fever   Severity:  Mild Onset quality:  Gradual Duration:  3 days Progression:  Worsening Chronicity:  New Relieved by:  None tried Worsened by:  Nothing tried Ineffective treatments:  None tried Associated symptoms: sneezing   Associated symptoms: no wheezing     Past Medical History  Diagnosis Date  . Diabetes mellitus   . High cholesterol   . Leukocytosis   . Anemia   . Obesity   . Sickle cell trait ([place])   . Hypertension     takes medicine to protect kidneys, does not have HTN  . Bronchitis   . Pneumonia   . Thymoma    Past Surgical History  Procedure Laterality Date  . Tonsillectomy    . Tubal ligation    . Mediastinotomy chamberlain mcneil Right 11/06/2012    Procedure: MEDIASTINOTOMY CHAMBERLAIN MCNEILPROCEDURE;  Surgeon: Gaye Pollack, MD;  Location: [place] [facility];  Service: Thoracic;  Laterality: Right;  . Lymph node biopsy Right 11/30/2012    Procedure: RIGHT TONSIL BIOPSY WITH FRESH FROZEN ANALYSIS;  Surgeon: Jodi Marble, MD;  Location: [place];  Service: ENT;  Laterality: Right;  . Mediasternotomy N/A 01/12/2013    Procedure: MEDIAN STERNOTOMY;  Surgeon: Gaye Pollack, MD;  Location: [place];  Service: Thoracic;  Laterality: N/A;  . Resection of a thymoma N/A 01/12/2013    Procedure: RESECTION OF A THYMOMA;  Surgeon: Gaye Pollack, MD;  Location: [place];  Service: Thoracic;  Laterality: N/A;  . Left heart catheterization with coronary angiogram N/A 05/25/2013    Procedure: LEFT HEART CATHETERIZATION WITH CORONARY  ANGIOGRAM;  Surgeon: Troy Sine, MD;  Location: [facility] CATH LAB;  Service: Cardiovascular;  Laterality: N/A;   Family History  Problem Relation Age of Onset  . Heart disease Father     No details  . Diabetes type II      Family HX  . Breast cancer     Social History  Substance Use Topics  . Smoking status: Passive Smoke Exposure - Never Smoker  . Smokeless tobacco: Never Used  . Alcohol Use: No   OB History    No data available     Review of Systems  Constitutional: Negative.  Negative for fever.  HENT: Positive for congestion, postnasal drip, rhinorrhea, sneezing and sore throat.   Respiratory: Positive for cough. Negative for shortness of breath and wheezing.   Cardiovascular: Negative.   Gastrointestinal: Negative.   Genitourinary: Negative.   All other systems reviewed and are negative.   Allergies  Amoxicillin-pot clavulanate  Home Medications   Prior to Admission medications   Medication Sig Start Date End Date Taking? Authorizing Provider  albuterol (PROVENTIL HFA;VENTOLIN HFA) 108 (90 Base) MCG/ACT inhaler Inhale 1-2 puffs into the lungs every 6 (six) hours as needed for wheezing or shortness of breath. 02/10/15   Wandra Arthurs, MD  aspirin EC 325 MG tablet Take 325 mg by mouth daily.    Historical Provider, MD  azithromycin (ZITHROMAX) 250 MG tablet Take two tabs on day one. Daily for the next four days  after. 04/16/15   Wyatt Portela, MD  carvedilol (COREG) 25 MG tablet TAKE 1 BY MOUTH TWICE DAILY 07/30/14   Minus Breeding, MD  cholecalciferol (VITAMIN D) 1000 UNITS tablet Take 2,000 Units by mouth daily.    Historical Provider, MD  cyclobenzaprine (FLEXERIL) 5 MG tablet Take 1 tablet (5 mg total) by mouth 3 (three) times daily as needed for muscle spasms. 02/10/15   Wandra Arthurs, MD  ferrous sulfate 325 (65 FE) MG EC tablet Take 1 tablet (325 mg total) by mouth daily with breakfast. 09/25/13   Wyatt Portela, MD  fexofenadine (ALLEGRA) 180 MG tablet Take 1 tablet (180  mg total) by mouth daily. 04/30/15   Billy Fischer, MD  fluticasone (FLONASE) 50 MCG/ACT nasal spray Place 1 spray into both nostrils 2 (two) times daily. 04/30/15   Billy Fischer, MD  furosemide (LASIX) 40 MG tablet Take 1 tablet (40 mg total) by mouth 2 (two) times daily. 10/04/14   Minus Breeding, MD  ibuprofen (ADVIL,MOTRIN) 200 MG tablet Take 400 mg by mouth every 6 (six) hours as needed.    Historical Provider, MD  Insulin Human (INSULIN PUMP) SOLN Inject 1 each into the skin continuous. "novolog"    Historical Provider, MD  lidocaine (LIDODERM) 5 % Place 3 patches onto the skin daily. Remove & Discard patch within 12 hours or as directed by MD 11/18/14   Melvenia Beam, MD  lisinopril (PRINIVIL,ZESTRIL) 10 MG tablet TAKE 1 BY MOUTH TWICE DAILY 12/23/14   Minus Breeding, MD  metFORMIN (GLUCOPHAGE) 1000 MG tablet Take 1,000 mg by mouth 2 (two) times daily with a meal.    Historical Provider, MD  montelukast (SINGULAIR) 10 MG tablet Take 1 tablet (10 mg total) by mouth at bedtime. 04/30/15   Billy Fischer, MD  Multiple Vitamin (MULTIVITAMIN WITH MINERALS) TABS Take 1 tablet by mouth every morning.     Historical Provider, MD  Phenylephrine-APAP-Guaifenesin [facility]) [phone] MG/20ML LIQD Take 2 capsules by mouth daily. 04/16/15   Wyatt Portela, MD  potassium chloride SA (K-DUR,KLOR-CON) 20 MEQ tablet Take 1 tablet (20 mEq total) by mouth daily. 10/29/13   Minus Breeding, MD  rosuvastatin (CRESTOR) 20 MG tablet Take 20 mg by mouth every evening. 12/13/14 12/13/15  Historical Provider, MD   Meds Ordered and Administered this Visit   Medications  triamcinolone acetonide (KENALOG-40) injection 40 mg (not administered)  methylPREDNISolone acetate (DEPO-MEDROL) injection 80 mg (not administered)    BP 130/81 mmHg  Pulse 102  Temp(Src) 99.9 F (37.7 C) (Oral)  Resp 20  Ht 5\' 9"  (1.753 m)  Wt 389 lb (176.449 kg)  BMI 57.42 kg/m2  SpO2 96%  LMP 06/17/2012 No data found.   Physical  Exam  Constitutional: She is oriented to person, place, and time. She appears well-developed and well-nourished. No distress.  HENT:  Mouth/Throat: Oropharynx is clear and moist.  Eyes: Conjunctivae are normal. Pupils are equal, round, and reactive to light.  Neck: Normal range of motion. Neck supple.  Cardiovascular: Normal rate, normal heart sounds and intact distal pulses.   Pulmonary/Chest: Effort normal and breath sounds normal.  Lymphadenopathy:    She has no cervical adenopathy.  Neurological: She is alert and oriented to person, place, and time.  Skin: Skin is warm and dry.  Nursing note and vitals reviewed.   ED Course  Procedures (including critical care time)  Labs Review Labs Reviewed - No data to display  Imaging Review No results  found.   Visual Acuity Review  Right Eye Distance:   Left Eye Distance:   Bilateral Distance:    Right Eye Near:   Left Eye Near:    Bilateral Near:         MDM   1. Seasonal allergic rhinitis due to pollen    Meds ordered this encounter  Medications  . triamcinolone acetonide (KENALOG-40) injection 40 mg    Sig:   . methylPREDNISolone acetate (DEPO-MEDROL) injection 80 mg    Sig:   . fluticasone (FLONASE) 50 MCG/ACT nasal spray    Sig: Place 1 spray into both nostrils 2 (two) times daily.    Dispense:  1 g    Refill:  2  . fexofenadine (ALLEGRA) 180 MG tablet    Sig: Take 1 tablet (180 mg total) by mouth daily.    Dispense:  30 tablet    Refill:  1  . montelukast (SINGULAIR) 10 MG tablet    Sig: Take 1 tablet (10 mg total) by mouth at bedtime.    Dispense:  30 tablet    Refill:  1       Billy Fischer, MD 04/30/15 [place], MD 04/30/15 [phone]

## 2015-04-30 NOTE — ED Notes (Signed)
Patient reports symptoms started Monday 4/17.  Patient complains of sneezing, coughing, feeling bad, headache and facial pain

## 2015-05-26 ENCOUNTER — Other Ambulatory Visit: Payer: Self-pay | Admitting: Cardiology

## 2015-05-26 NOTE — Telephone Encounter (Signed)
REFILL 

## 2015-09-18 ENCOUNTER — Ambulatory Visit (INDEPENDENT_AMBULATORY_CARE_PROVIDER_SITE_OTHER): Payer: BLUE CROSS/BLUE SHIELD

## 2015-09-18 ENCOUNTER — Ambulatory Visit (INDEPENDENT_AMBULATORY_CARE_PROVIDER_SITE_OTHER): Payer: BLUE CROSS/BLUE SHIELD | Admitting: Podiatry

## 2015-09-18 ENCOUNTER — Encounter: Payer: Self-pay | Admitting: Podiatry

## 2015-09-18 VITALS — BP 96/59 | HR 87 | Resp 16 | Ht 69.0 in | Wt 389.0 lb

## 2015-09-18 DIAGNOSIS — M79672 Pain in left foot: Secondary | ICD-10-CM | POA: Diagnosis not present

## 2015-09-18 DIAGNOSIS — M722 Plantar fascial fibromatosis: Secondary | ICD-10-CM

## 2015-09-18 DIAGNOSIS — E1149 Type 2 diabetes mellitus with other diabetic neurological complication: Secondary | ICD-10-CM

## 2015-09-18 DIAGNOSIS — M79604 Pain in right leg: Secondary | ICD-10-CM | POA: Diagnosis not present

## 2015-09-18 DIAGNOSIS — B351 Tinea unguium: Secondary | ICD-10-CM

## 2015-09-18 DIAGNOSIS — M79673 Pain in unspecified foot: Secondary | ICD-10-CM

## 2015-09-18 DIAGNOSIS — E1140 Type 2 diabetes mellitus with diabetic neuropathy, unspecified: Secondary | ICD-10-CM

## 2015-09-18 DIAGNOSIS — M79605 Pain in left leg: Secondary | ICD-10-CM

## 2015-09-18 MED ORDER — TRIAMCINOLONE ACETONIDE 10 MG/ML IJ SUSP
10.0000 mg | Freq: Once | INTRAMUSCULAR | Status: AC
  Administered 2015-09-18: 10 mg

## 2015-09-18 NOTE — Progress Notes (Signed)
   Subjective:    Patient ID: Glennon Hamilton, female    DOB: December 16, 1965, 50 y.o.   MRN: [id number]  HPI Chief Complaint  Patient presents with  . Foot Pain    Left foot; plantar; pt stated, "Entire bottom of foot hurts and radiates up the leg"; x2-3 yrs  . Nail Problem    Bilateral; pt stated, "wants all nails checked for nail fungus"  . Callouses    Right foot-plantar forefoot-below great toe-medial side; pt Diabetic Type 2; Sugar=250 at 10:30 this am; A1C=7.1      Review of Systems  Constitutional: Positive for activity change, diaphoresis and fatigue.  HENT: Positive for ear pain.   Eyes: Positive for visual disturbance.  Musculoskeletal: Positive for arthralgias, back pain, gait problem and myalgias.  Skin: Positive for color change.  Neurological: Positive for weakness and numbness.  Psychiatric/Behavioral: The patient is nervous/anxious.   All other systems reviewed and are negative.      Objective:   Physical Exam        Assessment & Plan:

## 2015-09-18 NOTE — Patient Instructions (Signed)

## 2015-09-19 NOTE — Progress Notes (Signed)
Subjective:     Patient ID: Kaitlin [name], female   DOB: 1965-08-08, 50 y.o.   MRN: [id number]  HPI patient presents stating she's developed a lot of pain in her left heel and also her nails are very thick and she cannot cut them and she has long-term diabetes   Review of Systems  All other systems reviewed and are negative.      Objective:   Physical Exam  Constitutional: She is oriented to person, place, and time.  Cardiovascular: Intact distal pulses.   Musculoskeletal: Normal range of motion.  Neurological: She is oriented to person, place, and time.  Skin: Skin is warm and dry. Erythema: neurovascular status found to be intact with muscle strength found to be adequate range of motion of the subtalar and midtarsal joints was found to be moderately reduced. Patient's found to have thick nail disease 1-5 both feet with mycotic component noted.  Nursing note and vitals reviewed.  I noted there to be inflammation and pain in the left plantar heel and I did notice diminishment of sharp dull and vibratory     Assessment:     Long-term diabetic with obesity and mycotic nail infections with acute plantar fasciitis left    Plan:     H&P conditions reviewed and today injected the plantar fascial left 3 mg Kenalog 5 mg Xylocaine and applied fascial brace to lift the arch. Debrided nailbeds 1-5 both feet and render diabetic education and diabetic foot education to patient

## 2015-09-25 ENCOUNTER — Ambulatory Visit: Payer: BLUE CROSS/BLUE SHIELD | Admitting: Podiatry

## 2015-09-26 ENCOUNTER — Other Ambulatory Visit: Payer: Self-pay | Admitting: Physician Assistant

## 2015-09-26 DIAGNOSIS — Z1231 Encounter for screening mammogram for malignant neoplasm of breast: Secondary | ICD-10-CM

## 2015-10-05 IMAGING — CT CT ANGIO CHEST
2 of 10 series · 18 of 46 positions shown · IV contrast (APPLIED)
Comparison: 12/11/2012 chest radiograph, 11/15/2012 PET-CT

CLINICAL DATA: Possible bronchitis

EXAM:
CT ANGIOGRAPHY CHEST WITH CONTRAST
TECHNIQUE: Multidetector CT imaging of the chest was performed using the
standard protocol during bolus administration of intravenous
contrast. Multiplanar CT image reconstructions including MIPs were
obtained to evaluate the vascular anatomy.
CONTRAST:  80mL OMNIPAQUE IOHEXOL 350 MG/ML SOLN

[Series 6: thins · axial · 0.67mm/px · z∈[-287,-35]mm · 15 of 286 slices shown]
[im 17/286  lung]
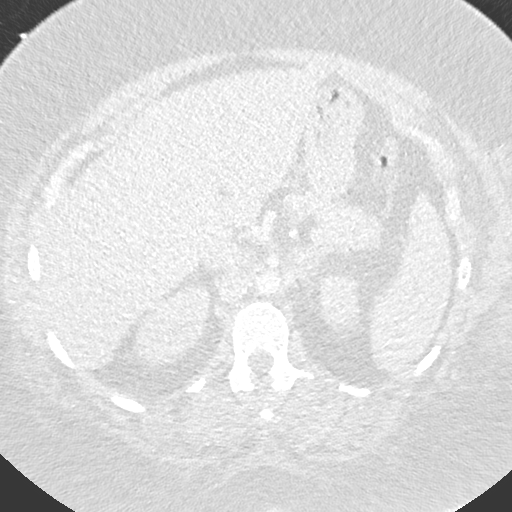
[im 34/286  soft-tissue]
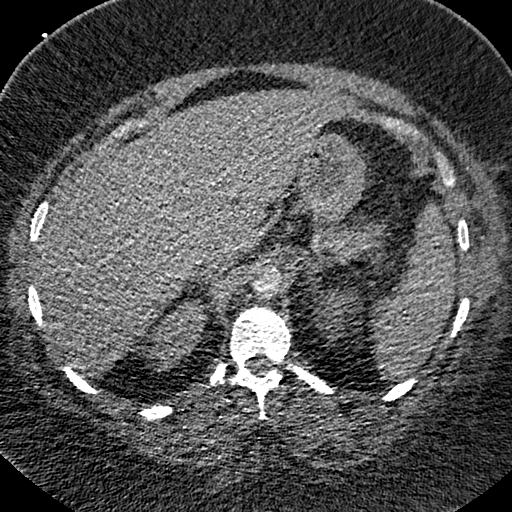
[im 51/286  lung]
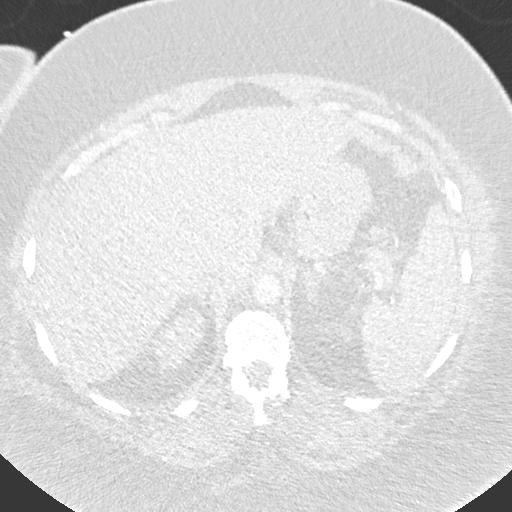
[im 68/286  soft-tissue]
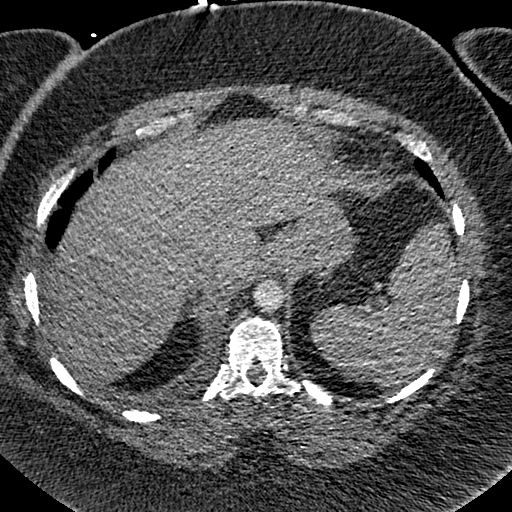
[im 84/286  lung]
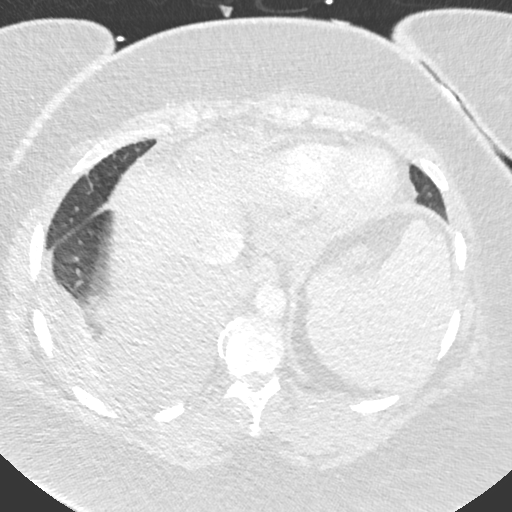
[im 101/286  soft-tissue]
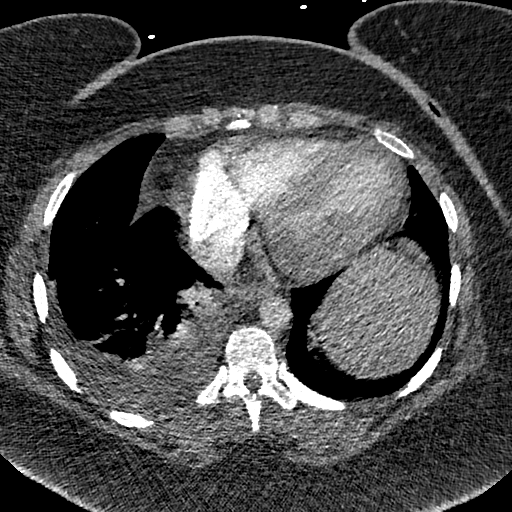
[im 118/286  lung]
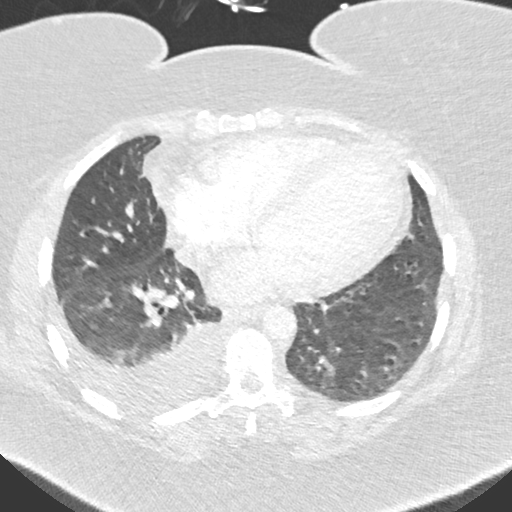
[im 151/286  soft-tissue]
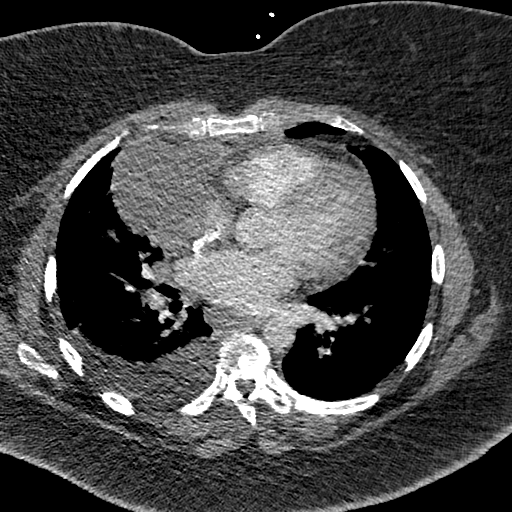
[im 168/286  lung]
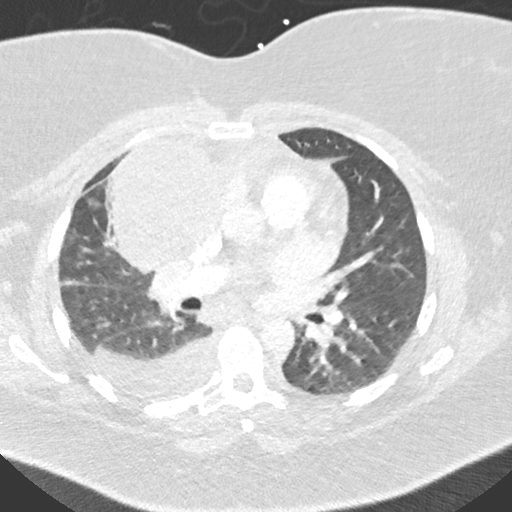
[im 185/286  soft-tissue]
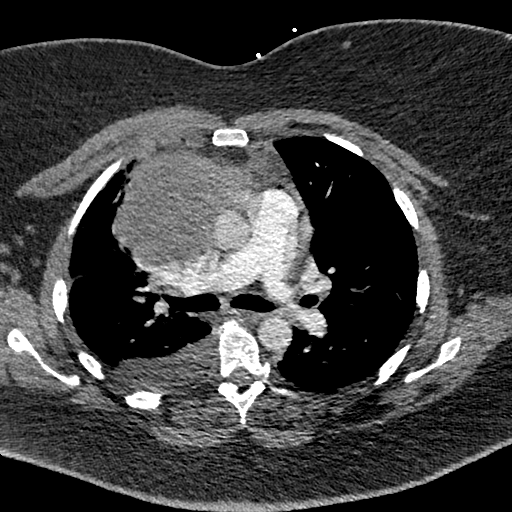
[im 202/286  lung]
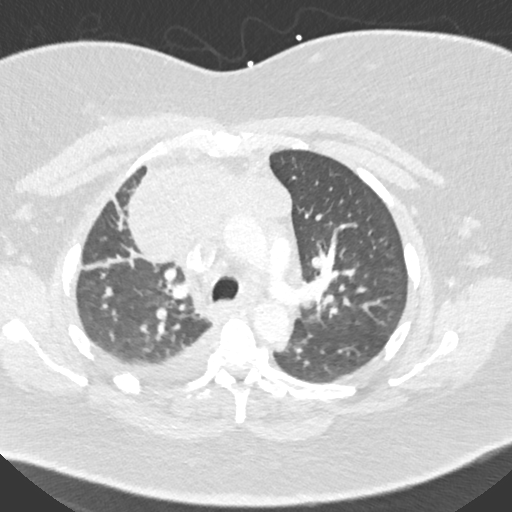
[im 218/286  soft-tissue]
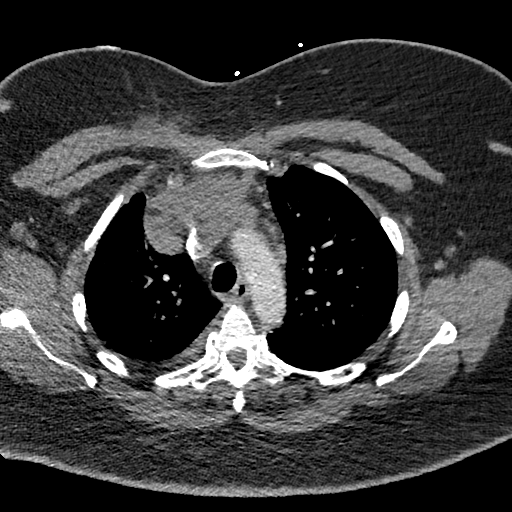
[im 235/286  lung]
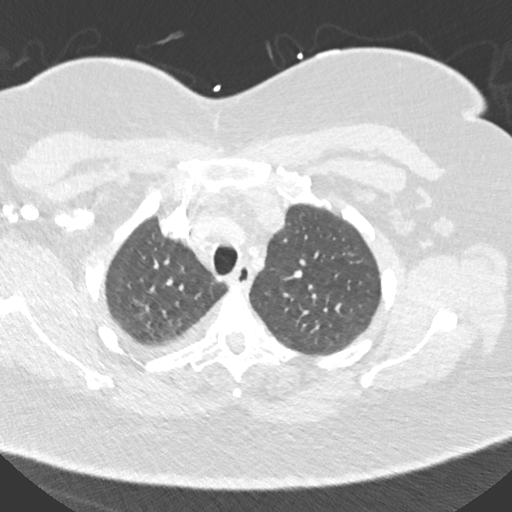
[im 252/286  soft-tissue]
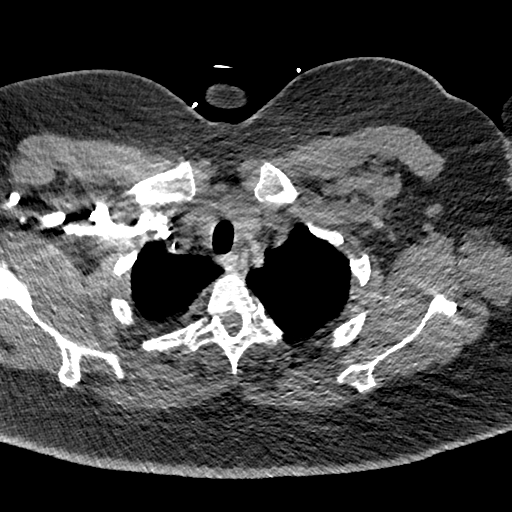
[im 269/286  lung]
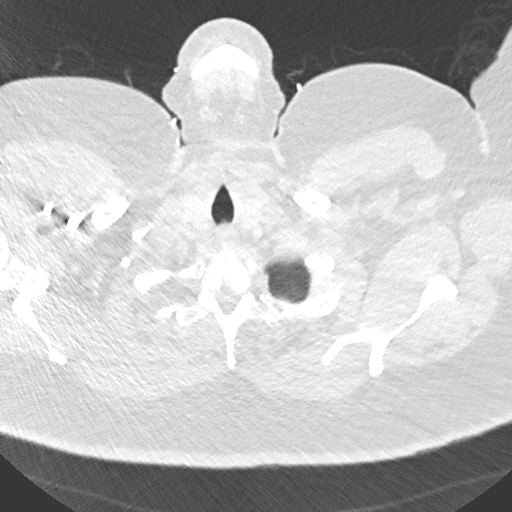

[Series 8: coronal mpr · coronal · 0.58mm/px · 3 of 125 slices shown]
[im 32/125  soft-tissue]
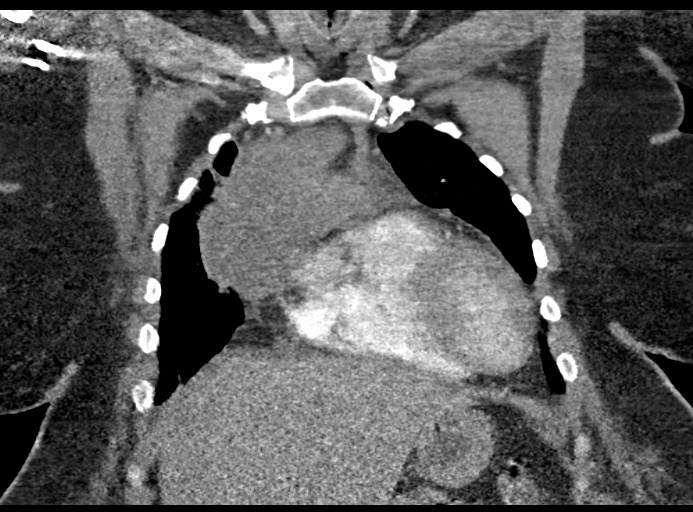
[im 63/125  soft-tissue]
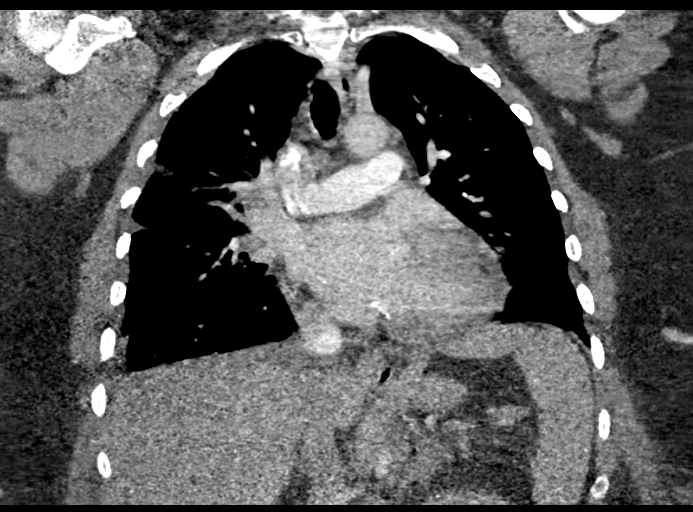
[im 94/125  soft-tissue]
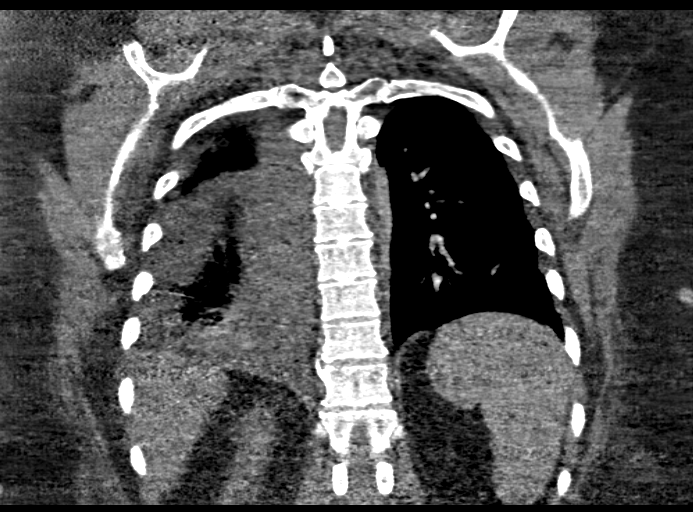

[18 of 46 positions shown; findings below may reference images not displayed]

FINDINGS: Suboptimal contrast bolus timing. No central pulmonary arterial
filling defect. Mixing of contrast and non-opacified blood within
the lobar and more peripheral branches limits evaluation.

Cardiomegaly. Trace pericardial fluid. Moderate right pleural
effusion.

Continued enlargement of the right paramediastinal/anterior
mediastinal mass, with margins inseparable from the right
mediastinum and right upper lung. At the level of the previous
measurement more superiorly, the mass measures 6.4 x 10.9 cm as
compared to 6.5 x 10.2 cm however slightly inferiorly, the mass
measures 8.4 x 9.0 cm as compared to 6.3 x 6.0 cm on the prior.
Prominent mediastinal and right hilar lymph nodes. As index,
subcarinal measuring 1.4 cm short axis.

Limited upper abdominal images show nothing acute.

Central airways are patent. No pneumothorax. Mild linear right lung
opacities. Respiratory motion degrades detailed lung parenchymal
evaluation.

Mild multilevel degenerative change.  No acute osseous finding.

Review of the MIP images confirms the above findings.
IMPRESSION: Suboptimal examination to evaluate for pulmonary embolism due to
motion and bolus timing. Within this limitation; no large central
filling defect. The lobar and more peripheral branches are not well
evaluated and pulmonary embolism is not excluded.

Interval enlargement of the anterior mediastinal mass.

Moderate right pleural effusion with associated airspace opacity;
atelectasis versus pneumonia, increased in the interval.

## 2015-10-08 ENCOUNTER — Other Ambulatory Visit: Payer: Self-pay | Admitting: Physician Assistant

## 2015-10-08 ENCOUNTER — Ambulatory Visit
Admission: RE | Admit: 2015-10-08 | Discharge: 2015-10-08 | Disposition: A | Discharge status: Home / Self Care | Payer: BLUE CROSS/BLUE SHIELD | Source: Ambulatory Visit | Attending: Physician Assistant | Admitting: Physician Assistant

## 2015-10-08 DIAGNOSIS — Z1231 Encounter for screening mammogram for malignant neoplasm of breast: Secondary | ICD-10-CM

## 2015-10-09 ENCOUNTER — Encounter: Payer: Self-pay | Admitting: Podiatry

## 2015-10-09 ENCOUNTER — Ambulatory Visit (INDEPENDENT_AMBULATORY_CARE_PROVIDER_SITE_OTHER): Payer: BLUE CROSS/BLUE SHIELD | Admitting: Podiatry

## 2015-10-09 DIAGNOSIS — M722 Plantar fascial fibromatosis: Secondary | ICD-10-CM

## 2015-10-09 MED ORDER — TRIAMCINOLONE ACETONIDE 10 MG/ML IJ SUSP
10.0000 mg | Freq: Once | INTRAMUSCULAR | Status: AC
  Administered 2015-10-09: 10 mg

## 2015-10-09 NOTE — Progress Notes (Signed)
Subjective:     Patient ID: Kaitlin [name], female   DOB: [birth year], 50 y.o.   MRN: [id number]  HPI patient states she's improving but has continued discomfort in the heel   Review of Systems     Objective:   Physical Exam Neurovascular status intact with discomfort plantar heel right over left with inflammation fluid around the medial band    Assessment:     Plantar fasciitis right still present    Plan:     Reinjected the fascia 3 mg Kenalog 5 mill grams Xylocaine and advised on physical therapy supportive shoes and not going barefoot and reappoint

## 2015-10-16 ENCOUNTER — Other Ambulatory Visit (HOSPITAL_BASED_OUTPATIENT_CLINIC_OR_DEPARTMENT_OTHER): Payer: BLUE CROSS/BLUE SHIELD

## 2015-10-16 ENCOUNTER — Ambulatory Visit (HOSPITAL_BASED_OUTPATIENT_CLINIC_OR_DEPARTMENT_OTHER): Payer: BLUE CROSS/BLUE SHIELD | Admitting: Oncology

## 2015-10-16 ENCOUNTER — Telehealth: Payer: Self-pay | Admitting: Oncology

## 2015-10-16 VITALS — BP 97/47 | HR 91 | Temp 98.8°F | Resp 18 | Ht 69.0 in | Wt 379.5 lb

## 2015-10-16 DIAGNOSIS — D509 Iron deficiency anemia, unspecified: Secondary | ICD-10-CM

## 2015-10-16 DIAGNOSIS — D4989 Neoplasm of unspecified behavior of other specified sites: Secondary | ICD-10-CM

## 2015-10-16 DIAGNOSIS — D150 Benign neoplasm of thymus: Secondary | ICD-10-CM

## 2015-10-16 DIAGNOSIS — D508 Other iron deficiency anemias: Secondary | ICD-10-CM

## 2015-10-16 LAB — CBC WITH DIFFERENTIAL/PLATELET
BASO%: 0.5 % (ref 0.0–2.0)
Basophils Absolute: 0.1 10*3/uL (ref 0.0–0.1)
EOS%: 0 % (ref 0.0–7.0)
Eosinophils Absolute: 0 10*3/uL (ref 0.0–0.5)
HCT: 38 % (ref 34.8–46.6)
HEMOGLOBIN: 12.2 g/dL (ref 11.6–15.9)
LYMPH%: 28.1 % (ref 14.0–49.7)
MCH: 24.4 pg — AB (ref 25.1–34.0)
MCHC: 32.2 g/dL (ref 31.5–36.0)
MCV: 75.7 fL — ABNORMAL LOW (ref 79.5–101.0)
MONO#: 0.5 10*3/uL (ref 0.1–0.9)
MONO%: 4 % (ref 0.0–14.0)
NEUT%: 67.4 % (ref 38.4–76.8)
NEUTROS ABS: 8.7 10*3/uL — AB (ref 1.5–6.5)
Platelets: 285 10*3/uL (ref 145–400)
RBC: 5.02 10*6/uL (ref 3.70–5.45)
RDW: 17.6 % — AB (ref 11.2–14.5)
WBC: 12.9 10*3/uL — AB (ref 3.9–10.3)
lymph#: 3.6 10*3/uL — ABNORMAL HIGH (ref 0.9–3.3)

## 2015-10-16 LAB — IRON AND TIBC
%SAT: 13 % — AB (ref 21–57)
IRON: 40 ug/dL — AB (ref 41–142)
TIBC: 310 ug/dL (ref 236–444)
UIBC: 270 ug/dL (ref 120–384)

## 2015-10-16 LAB — FERRITIN: FERRITIN: 62 ng/mL (ref 9–269)

## 2015-10-16 NOTE — Telephone Encounter (Signed)
Gave patient avs report and appointments for April. Central radiology will call re scan.  °

## 2015-10-16 NOTE — Progress Notes (Signed)
Hematology and Oncology Follow Up Visit  NYLAA BULLEN [id number] August 20, 1965 50 y.o. 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 She presented with a large mediastinal mass and shortness of breath.  Past therapy: She is status post median sternotomy with resection of the thymoma on 01/12/13.   Current therapy: Observation and surveillance.  Interim History: Mrs. Kaitlin [name] presents today for a follow up visit.  Since her last visit, she continues to show improvement in her overall health and improvement in her performance status. She is planning to return to work in the near future. She remains on oral iron replacement without any major complications. She does report some minor constipation associated with her medication in general. She has not reported any chest pain or discomfort. She does not report any cough, wheezing or hemoptysis. He does not report any other constitutional symptoms.  She is not reporting any headaches or blurry vision or syncope. She does not report any palpitation or orthopnea. She does not report any nausea or vomiting or abdominal pain. She has not reported any fevers or chills or sweats. She does not report any frequency urgency or hesitancy. Her rest or view of system is unremarkable.  Medications: I have reviewed the patient's current medications.  Current Outpatient Prescriptions  Medication Sig Dispense Refill  . albuterol (PROVENTIL HFA;VENTOLIN HFA) 108 (90 Base) MCG/ACT inhaler Inhale 1-2 puffs into the lungs every 6 (six) hours as needed for wheezing or shortness of breath. 1 Inhaler 0  . amoxicillin (AMOXIL) 500 MG capsule Take 500 mg by mouth 3 (three) times daily. For dental work  0  . aspirin EC 325 MG tablet Take 325 mg by mouth daily.    . carvedilol (COREG) 25 MG tablet TAKE 1 BY MOUTH TWICE DAILY 180 tablet 1  . cholecalciferol (VITAMIN D) 1000 UNITS tablet Take 2,000 Units by mouth daily.    .  ferrous sulfate 325 (65 FE) MG EC tablet Take 1 tablet (325 mg total) by mouth daily with breakfast. 30 tablet 3  . fexofenadine (ALLEGRA) 180 MG tablet Take 1 tablet (180 mg total) by mouth daily. 30 tablet 1  . furosemide (LASIX) 40 MG tablet Take 1 tablet (40 mg total) by mouth 2 (two) times daily. 60 tablet 8  . ibuprofen (ADVIL,MOTRIN) 200 MG tablet Take 400 mg by mouth every 6 (six) hours as needed.    . Insulin Human (INSULIN PUMP) SOLN Inject 1 each into the skin continuous. "novolog"    . lidocaine (LIDODERM) 5 % Place 3 patches onto the skin daily. Remove & Discard patch within 12 hours or as directed by MD 90 patch 6  . lisinopril (PRINIVIL,ZESTRIL) 10 MG tablet TAKE 1 BY MOUTH TWICE DAILY 180 tablet 3  . metFORMIN (GLUCOPHAGE) 1000 MG tablet Take 1,000 mg by mouth 2 (two) times daily with a meal.    . Multiple Vitamin (MULTIVITAMIN WITH MINERALS) TABS Take 1 tablet by mouth every morning.     Marland Kitchen Phenylephrine-APAP-Guaifenesin (MUCINEX SINUS-MAX) 10-650-400 MG/20ML LIQD Take 2 capsules by mouth daily. 237 mL 0  . potassium chloride SA (K-DUR,KLOR-CON) 20 MEQ tablet Take 1 tablet (20 mEq total) by mouth daily. 30 tablet 2  . rosuvastatin (CRESTOR) 20 MG tablet Take 20 mg by mouth every evening.    . fluticasone (FLONASE) 50 MCG/ACT nasal spray Place 1 spray into both nostrils 2 (two) times daily. (Patient not taking: Reported on 10/16/2015) 1 g 2  .  montelukast (SINGULAIR) 10 MG tablet Take 1 tablet (10 mg total) by mouth at bedtime. (Patient not taking: Reported on 10/16/2015) 30 tablet 1   No current facility-administered medications for this visit.     Allergies:  Allergies  Allergen Reactions  . Amoxicillin-Pot Clavulanate Diarrhea     Physical Exam: Blood pressure (!) 97/47, pulse 91, temperature 98.8 F (37.1 C), temperature source Oral, resp. rate 18, height 5\' 9"  (1.753 m), weight (!) 379 lb 8 oz (172.1 kg), last menstrual period 06/17/2012, SpO2 98 %. ECOG: 1 General  appearance: Well-appearing woman without distress. Head: Normocephalic, without obvious abnormality, atraumatic.  No oral ulcers or lesions. Neck: no adenopathy.  Lymph nodes: Cervical, supraclavicular, and axillary nodes normal. Heart:regular rate and rhythm, S1, S2 normal, no murmur, click, rub or gallop Lung:chest clear, no wheezing, rales, normal symmetric air entry Abdomin: soft, non-tender, without masses or organomegaly. No shifting dullness or ascites. EXT:no erythema, induration, or nodules. No edema noted.   Lab Results: Lab Results  Component Value Date   WBC 12.9 (H) 10/16/2015   HGB 12.2 10/16/2015   HCT 38.0 10/16/2015   MCV 75.7 (L) 10/16/2015   PLT 285 10/16/2015     Chemistry      Component Value Date/Time   NA 145 04/14/2015 1142   K 4.2 04/14/2015 1142   CL 107 02/10/2015 2011   CL 108 (H) 03/29/2012 0945   CO2 27 04/14/2015 1142   BUN 19.0 04/14/2015 1142   CREATININE 1.0 04/14/2015 1142      Component Value Date/Time   CALCIUM 9.4 04/14/2015 1142   ALKPHOS 78 04/14/2015 1142   AST 20 04/14/2015 1142   ALT 20 04/14/2015 1142   BILITOT 0.38 04/14/2015 1142        IMPRESSION: Postsurgical changes related to thymic resection. No residual or recurrent anterior mediastinal mass.  Stable 5 x 4 mm right middle lobe nodule along the minor fissure. No findings specific for metastatic disease.  Impression and Plan:  50 year old woman with the following issues:  1. 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 Her pathological staging was T2 N0. She did have a focally involved margin and radiation was deferred for salvage purposes.   CT scan from April 2017 was reviewed again today and showed no evidence of recurrence. The plan is to continue with active surveillance and you salvage radiation therapy upon recurrence. She will have repeat imaging studies in April 2018.  2. Microcytosis and mild anemia: likely due to mild iron  deficiency anemia. She is currently on iron supplements and her hemoglobin is back to normal. I recommended continuing oral iron therapy and I will recheck her iron stores before the next visit.  3. Follow-up: Will be in 6 months for a repeat CT scan and repeat iron studies.   [id number], MD 10/5/201710:43 AM

## 2015-10-30 ENCOUNTER — Ambulatory Visit (INDEPENDENT_AMBULATORY_CARE_PROVIDER_SITE_OTHER): Payer: BLUE CROSS/BLUE SHIELD | Admitting: Podiatry

## 2015-10-30 ENCOUNTER — Encounter: Payer: Self-pay | Admitting: Podiatry

## 2015-10-30 ENCOUNTER — Ambulatory Visit: Payer: BLUE CROSS/BLUE SHIELD | Admitting: Podiatry

## 2015-10-30 DIAGNOSIS — M722 Plantar fascial fibromatosis: Secondary | ICD-10-CM

## 2015-10-30 NOTE — Patient Instructions (Signed)

## 2015-11-03 NOTE — Progress Notes (Signed)
Subjective:     Patient ID: Kaitlin [name], female   DOB: [birth year], 50 y.o.   MRN: [id number]  HPI patient presents to pickup orthotics   Review of Systems     Objective:   Physical Exam Neurovascular status intact with moderate pain    Assessment:     Symptoms still present    Plan:     Orthotics dispensed with instructions and reappoint to recheck

## 2015-11-05 ENCOUNTER — Other Ambulatory Visit: Payer: Self-pay | Admitting: Cardiology

## 2015-12-12 ENCOUNTER — Other Ambulatory Visit: Payer: Self-pay

## 2015-12-12 MED ORDER — CARVEDILOL 25 MG PO TABS: ORAL_TABLET | ORAL | 0 refills | Status: DC

## 2015-12-12 MED ORDER — POTASSIUM CHLORIDE CRYS ER 20 MEQ PO TBCR: 20.0000 meq | EXTENDED_RELEASE_TABLET | Freq: Every day | ORAL | 0 refills | Status: DC

## 2015-12-19 ENCOUNTER — Other Ambulatory Visit: Payer: Self-pay

## 2015-12-19 MED ORDER — LISINOPRIL 10 MG PO TABS: 10.0000 mg | ORAL_TABLET | Freq: Every day | ORAL | 0 refills | Status: DC

## 2015-12-19 MED ORDER — FUROSEMIDE 40 MG PO TABS: 40.0000 mg | ORAL_TABLET | Freq: Two times a day (BID) | ORAL | 0 refills | Status: DC

## 2016-04-07 ENCOUNTER — Other Ambulatory Visit: Payer: Self-pay

## 2016-04-07 MED ORDER — CARVEDILOL 25 MG PO TABS: ORAL_TABLET | ORAL | 0 refills | Status: DC

## 2016-04-07 MED ORDER — FUROSEMIDE 40 MG PO TABS: 40.0000 mg | ORAL_TABLET | Freq: Two times a day (BID) | ORAL | 0 refills | Status: DC

## 2016-04-13 ENCOUNTER — Other Ambulatory Visit (HOSPITAL_BASED_OUTPATIENT_CLINIC_OR_DEPARTMENT_OTHER): Payer: BLUE CROSS/BLUE SHIELD

## 2016-04-13 DIAGNOSIS — D509 Iron deficiency anemia, unspecified: Secondary | ICD-10-CM | POA: Diagnosis not present

## 2016-04-13 DIAGNOSIS — D4989 Neoplasm of unspecified behavior of other specified sites: Secondary | ICD-10-CM

## 2016-04-13 DIAGNOSIS — D150 Benign neoplasm of thymus: Principal | ICD-10-CM

## 2016-04-13 LAB — CBC WITH DIFFERENTIAL/PLATELET
BASO%: 0 % (ref 0.0–2.0)
BASOS ABS: 0 10*3/uL (ref 0.0–0.1)
EOS ABS: 0 10*3/uL (ref 0.0–0.5)
EOS%: 0 % (ref 0.0–7.0)
HEMATOCRIT: 35.2 % (ref 34.8–46.6)
HEMOGLOBIN: 11.8 g/dL (ref 11.6–15.9)
LYMPH#: 3.8 10*3/uL — AB (ref 0.9–3.3)
LYMPH%: 36.2 % (ref 14.0–49.7)
MCH: 25.1 pg (ref 25.1–34.0)
MCHC: 33.5 g/dL (ref 31.5–36.0)
MCV: 74.7 fL — ABNORMAL LOW (ref 79.5–101.0)
MONO#: 0.5 10*3/uL (ref 0.1–0.9)
MONO%: 4.7 % (ref 0.0–14.0)
NEUT#: 6.2 10*3/uL (ref 1.5–6.5)
NEUT%: 59.1 % (ref 38.4–76.8)
Platelets: 243 10*3/uL (ref 145–400)
RBC: 4.71 10*6/uL (ref 3.70–5.45)
RDW: 16.6 % — AB (ref 11.2–14.5)
WBC: 10.5 10*3/uL — ABNORMAL HIGH (ref 3.9–10.3)

## 2016-04-13 LAB — IRON AND TIBC
%SAT: 12 % — ABNORMAL LOW (ref 21–57)
IRON: 36 ug/dL — AB (ref 41–142)
TIBC: 301 ug/dL (ref 236–444)
UIBC: 265 ug/dL (ref 120–384)

## 2016-04-13 LAB — COMPREHENSIVE METABOLIC PANEL
ALBUMIN: 3.2 g/dL — AB (ref 3.5–5.0)
ALT: 17 U/L (ref 0–55)
AST: 14 U/L (ref 5–34)
Alkaline Phosphatase: 99 U/L (ref 40–150)
Anion Gap: 8 mEq/L (ref 3–11)
BILIRUBIN TOTAL: 0.28 mg/dL (ref 0.20–1.20)
BUN: 19 mg/dL (ref 7.0–26.0)
CALCIUM: 9.7 mg/dL (ref 8.4–10.4)
CHLORIDE: 110 meq/L — AB (ref 98–109)
CO2: 26 meq/L (ref 22–29)
CREATININE: 0.9 mg/dL (ref 0.6–1.1)
EGFR: 84 mL/min/{1.73_m2} — ABNORMAL LOW (ref >90)
Glucose: 120 mg/dl (ref 70–140)
Potassium: 4.1 mEq/L (ref 3.5–5.1)
Sodium: 144 mEq/L (ref 136–145)
Total Protein: 6.4 g/dL (ref 6.4–8.3)

## 2016-04-13 LAB — FERRITIN: FERRITIN: 48 ng/mL (ref 9–269)

## 2016-04-15 ENCOUNTER — Telehealth: Payer: Self-pay | Admitting: Oncology

## 2016-04-15 ENCOUNTER — Ambulatory Visit (HOSPITAL_BASED_OUTPATIENT_CLINIC_OR_DEPARTMENT_OTHER): Payer: BLUE CROSS/BLUE SHIELD | Admitting: Oncology

## 2016-04-15 VITALS — BP 130/68 | HR 89 | Temp 98.6°F | Resp 18 | Ht 69.0 in | Wt 385.6 lb

## 2016-04-15 DIAGNOSIS — D509 Iron deficiency anemia, unspecified: Secondary | ICD-10-CM

## 2016-04-15 DIAGNOSIS — D4989 Neoplasm of unspecified behavior of other specified sites: Secondary | ICD-10-CM

## 2016-04-15 DIAGNOSIS — Z87898 Personal history of other specified conditions: Secondary | ICD-10-CM

## 2016-04-15 DIAGNOSIS — D500 Iron deficiency anemia secondary to blood loss (chronic): Secondary | ICD-10-CM

## 2016-04-15 DIAGNOSIS — D649 Anemia, unspecified: Secondary | ICD-10-CM | POA: Insufficient documentation

## 2016-04-15 DIAGNOSIS — D508 Other iron deficiency anemias: Secondary | ICD-10-CM

## 2016-04-15 DIAGNOSIS — D150 Benign neoplasm of thymus: Secondary | ICD-10-CM

## 2016-04-15 NOTE — Telephone Encounter (Signed)
Appointments scheduled per 4.5.18 LOS. Patient given AVS report and calendars with future scheduled appointments. °

## 2016-04-15 NOTE — Progress Notes (Signed)
Hematology and Oncology Follow Up Visit  Kaitlin [name] [id number] 1965/03/12 51 y.o. 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 She presented with a large mediastinal mass and shortness of breath. She also has chronic iron deficiency anemia.  Past therapy: She is status post median sternotomy with resection of the thymoma on 01/12/13.   Current therapy:  Observation and surveillance for her thymoma. He is currently taking oral iron supplement daily.   Interim History: Kaitlin [name] presents today for a follow up visit.  Since her last visit, she reports no major changes in her health. She denied any recent complaints of shortness of breath or difficulty breathing. She does have intermittent chest wall pain associated with her surgery. She denied any recent respiratory complaints. She denied any recent hospitalizations. She did develop the flu which has resolved at this time.  She remains on oral iron replacement without any major complications. She does report some minor constipation associated with her medication in general. She denied any hematochezia or melena. She has very little to no menstrual bleeding.  She is not reporting any headaches or blurry vision or syncope. She does not report any palpitation or orthopnea. She does not report any nausea or vomiting or abdominal pain. She has not reported any fevers or chills or sweats. She does not report any frequency urgency or hesitancy. Her rest or view of system is unremarkable.  Medications: I have reviewed the patient's current medications.  Current Outpatient Prescriptions  Medication Sig Dispense Refill  . albuterol (PROVENTIL HFA;VENTOLIN HFA) 108 (90 Base) MCG/ACT inhaler Inhale 1-2 puffs into the lungs every 6 (six) hours as needed for wheezing or shortness of breath. 1 Inhaler 0  . amoxicillin (AMOXIL) 500 MG capsule Take 500 mg by mouth 3 (three) times daily. For dental  work  0  . aspirin EC 325 MG tablet Take 325 mg by mouth daily.    . carvedilol (COREG) 25 MG tablet TAKE 1 BY MOUTH TWICE DAILY 120 tablet 0  . cholecalciferol (VITAMIN D) 1000 UNITS tablet Take 2,000 Units by mouth daily.    . ferrous sulfate 325 (65 FE) MG EC tablet Take 1 tablet (325 mg total) by mouth daily with breakfast. 30 tablet 3  . fexofenadine (ALLEGRA) 180 MG tablet Take 1 tablet (180 mg total) by mouth daily. 30 tablet 1  . fluticasone (FLONASE) 50 MCG/ACT nasal spray Place 1 spray into both nostrils 2 (two) times daily. 1 g 2  . furosemide (LASIX) 40 MG tablet Take 1 tablet (40 mg total) by mouth 2 (two) times daily. PLEASE CONTACT OFFICE FOR ADDITIONAL REFILLS, 2nd attempt 120 tablet 0  . ibuprofen (ADVIL,MOTRIN) 200 MG tablet Take 400 mg by mouth every 6 (six) hours as needed.    . Insulin Human (INSULIN PUMP) SOLN Inject 1 each into the skin continuous. "novolog"    . lidocaine (LIDODERM) 5 % Place 3 patches onto the skin daily. Remove & Discard patch within 12 hours or as directed by MD 90 patch 6  . lisinopril (PRINIVIL,ZESTRIL) 10 MG tablet Take 1 tablet (10 mg total) by mouth daily. PLEASE CONTACT OFFICE FOR ADDITIONAL REFILLS 180 tablet 0  . metFORMIN (GLUCOPHAGE) 1000 MG tablet Take 1,000 mg by mouth 2 (two) times daily with a meal.    . montelukast (SINGULAIR) 10 MG tablet Take 1 tablet (10 mg total) by mouth at bedtime. 30 tablet 1  . Multiple Vitamin (MULTIVITAMIN  WITH MINERALS) TABS Take 1 tablet by mouth every morning.     Marland Kitchen Phenylephrine-APAP-Guaifenesin (MUCINEX SINUS-MAX) 10-650-400 MG/20ML LIQD Take 2 capsules by mouth daily. 237 mL 0  . potassium chloride SA (K-DUR,KLOR-CON) 20 MEQ tablet Take 1 tablet (20 mEq total) by mouth daily. PLEASE MAKE APPOINTMENT FOR FURTHER REFILLS 180 tablet 0  . rosuvastatin (CRESTOR) 20 MG tablet Take 20 mg by mouth every evening.     No current facility-administered medications for this visit.     Allergies:  Allergies   Allergen Reactions  . Amoxicillin-Pot Clavulanate Diarrhea     Physical Exam: Blood pressure 130/68, pulse 89, temperature 98.6 F (37 C), temperature source Oral, resp. rate 18, height 5\' 9"  (1.753 m), weight (!) 385 lb 9.6 oz (174.9 kg), last menstrual period 06/17/2012, SpO2 98 %. ECOG: 1 General appearance: Well-appearing woman without distress. Head: Normocephalic, without obvious abnormality, atraumatic.  No oral ulcers or lesions. Neck: no adenopathy.  Lymph nodes: Cervical, supraclavicular, and axillary nodes normal. Heart:regular rate and rhythm, S1, S2 normal, no murmur, click, rub or gallop Lung:chest clear, no wheezing, rales, normal symmetric air entry Abdomin: soft, non-tender, without masses or organomegaly. No shifting dullness or ascites. EXT:no erythema, induration, or nodules. No edema noted.   Lab Results: Lab Results  Component Value Date   WBC 10.5 (H) 04/13/2016   HGB 11.8 04/13/2016   HCT 35.2 04/13/2016   MCV 74.7 (L) 04/13/2016   PLT 243 04/13/2016     Chemistry      Component Value Date/Time   NA 144 04/13/2016 0954   K 4.1 04/13/2016 0954   CL 107 02/10/2015 2011   CL 108 (H) 03/29/2012 0945   CO2 26 04/13/2016 0954   BUN 19.0 04/13/2016 0954   CREATININE 0.9 04/13/2016 0954      Component Value Date/Time   CALCIUM 9.7 04/13/2016 0954   ALKPHOS 99 04/13/2016 0954   AST 14 04/13/2016 0954   ALT 17 04/13/2016 0954   BILITOT 0.28 04/13/2016 0954      Results for MILISA, KIMBELL (MRN [id number]) as of 04/15/2016 09:57  Ref. Range 04/13/2016 09:54  Iron Latest Ref Range: 41 - 142 ug/dL 36 (L)  UIBC Latest Ref Range: 120 - 384 ug/dL 265  TIBC Latest Ref Range: 236 - 444 ug/dL 301  %SAT Latest Ref Range: 21 - 57 % 12 (L)  Ferritin Latest Ref Range: 9 - 269 ng/ml 48      IImpression and Plan:  51 year old woman with the following issues:  1. 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 Her pathological staging  was T2 N0. She did have a focally involved margin and radiation was deferred for salvage purposes.   CT scan from April 2017 showed no evidence of recurrence. The plan is to continue with active surveillance and you salvage radiation therapy upon recurrence. She is to have a CT scan in April 2018 which has not been scheduled at this time. We'll communicate the results of the scan to her  2. Microcytosis and mild anemia: likely due to mild iron deficiency anemia. She is currently on iron supplements and her hemoglobin continues to drift lower at this time. Iron studies are also declining despite oral iron replacement.  Options were reviewed today includes increasing her oral iron replacement to multiple times a day with vitamin C versus intravenous iron. Risks and benefits of Feraheme infusion were reviewed and she is agreeable to receive it. There is a risk of  arthralgias, myalgias and rarely anaphylaxis associated with this infusion. The benefit will be decreased her need for oral iron replacement and correcting her anemia.  3. Follow-up: Will be in 6 months for a repeat iron studies at that time.   [facility], MD 4/5/201810:11 AM

## 2016-04-23 ENCOUNTER — Ambulatory Visit (HOSPITAL_BASED_OUTPATIENT_CLINIC_OR_DEPARTMENT_OTHER): Payer: BLUE CROSS/BLUE SHIELD

## 2016-04-23 VITALS — BP 104/71 | HR 94 | Temp 98.9°F | Resp 16

## 2016-04-23 DIAGNOSIS — D509 Iron deficiency anemia, unspecified: Secondary | ICD-10-CM | POA: Diagnosis not present

## 2016-04-23 DIAGNOSIS — D500 Iron deficiency anemia secondary to blood loss (chronic): Secondary | ICD-10-CM

## 2016-04-23 MED ORDER — SODIUM CHLORIDE 0.9 % IV SOLN
510.0000 mg | Freq: Once | INTRAVENOUS | Status: AC
  Administered 2016-04-23: 510 mg via INTRAVENOUS
  Filled 2016-04-23: qty 17

## 2016-04-23 MED ORDER — SODIUM CHLORIDE 0.9 % IV SOLN
Freq: Once | INTRAVENOUS | Status: AC
  Administered 2016-04-23: 10:00:00 via INTRAVENOUS

## 2016-04-23 NOTE — Patient Instructions (Signed)

## 2016-04-26 ENCOUNTER — Other Ambulatory Visit (HOSPITAL_COMMUNITY)
Admission: RE | Admit: 2016-04-26 | Discharge: 2016-04-26 | Disposition: A | Discharge status: Home / Self Care | Payer: BLUE CROSS/BLUE SHIELD | Source: Ambulatory Visit | Attending: Physician Assistant | Admitting: Physician Assistant

## 2016-04-26 ENCOUNTER — Other Ambulatory Visit: Payer: Self-pay | Admitting: Physician Assistant

## 2016-04-26 DIAGNOSIS — Z1151 Encounter for screening for human papillomavirus (HPV): Secondary | ICD-10-CM | POA: Insufficient documentation

## 2016-04-26 DIAGNOSIS — Z124 Encounter for screening for malignant neoplasm of cervix: Secondary | ICD-10-CM | POA: Insufficient documentation

## 2016-04-28 ENCOUNTER — Encounter (HOSPITAL_COMMUNITY): Payer: Self-pay

## 2016-04-28 ENCOUNTER — Ambulatory Visit (HOSPITAL_COMMUNITY)
Admission: RE | Admit: 2016-04-28 | Discharge: 2016-04-28 | Disposition: A | Discharge status: Home / Self Care | Payer: BLUE CROSS/BLUE SHIELD | Source: Ambulatory Visit | Attending: Oncology | Admitting: Oncology

## 2016-04-28 DIAGNOSIS — D150 Benign neoplasm of thymus: Secondary | ICD-10-CM | POA: Insufficient documentation

## 2016-04-28 DIAGNOSIS — R918 Other nonspecific abnormal finding of lung field: Secondary | ICD-10-CM | POA: Insufficient documentation

## 2016-04-28 DIAGNOSIS — D508 Other iron deficiency anemias: Secondary | ICD-10-CM | POA: Diagnosis present

## 2016-04-28 DIAGNOSIS — Z9889 Other specified postprocedural states: Secondary | ICD-10-CM | POA: Insufficient documentation

## 2016-04-28 DIAGNOSIS — D4989 Neoplasm of unspecified behavior of other specified sites: Secondary | ICD-10-CM

## 2016-04-29 ENCOUNTER — Telehealth: Payer: Self-pay | Admitting: *Deleted

## 2016-04-29 LAB — CYTOLOGY - PAP
Adequacy: ABSENT
DIAGNOSIS: NEGATIVE
HPV: NOT DETECTED

## 2016-04-29 NOTE — Telephone Encounter (Signed)
-----   Message from Wyatt Portela, MD sent at 04/29/2016  8:59 AM EDT ----- Please let her know CT is normal.

## 2016-04-29 NOTE — Telephone Encounter (Signed)
As noted below by Dr. Alen Blew, I informed her that her CT scan is normal. Patient verbalized understanding.

## 2016-05-17 NOTE — Progress Notes (Signed)
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  Perfusion imaging suggested a defect in the anteroseptal wall. However, cardiac catheterization demonstrated normal coronaries.   Echo December 2015 demonstrated the EF was now up to 45-50%.  She returns for follow up.  I haven't seen her since early December 2016. Shortly after that her husband died. She complains of easy fatigability. She gets short of breath with mild activity. She can walk 10 yards and had shortness of breath and have to stop and recover. She's not describing chest pressure, neck or arm discomfort. She's not having any PND or orthopnea.   Of note her nephrologist reduced her dose of Lotensin because her blood pressure was low and because of fatigue.   Allergies  Allergen Reactions  . Amoxicillin-Pot Clavulanate Diarrhea    Current Outpatient Prescriptions  Medication Sig Dispense Refill  . albuterol (PROVENTIL HFA;VENTOLIN HFA) 108 (90 Base) MCG/ACT inhaler Inhale 1-2 puffs into the lungs every 6 (six) hours as needed for wheezing or shortness of breath. 1 Inhaler 0  . carvedilol (COREG) 25 MG tablet TAKE 1 BY MOUTH TWICE DAILY 120 tablet 0  . cetirizine (ZYRTEC) 10 MG tablet Take 10 mg by mouth daily.    . cholecalciferol (VITAMIN D) 1000 UNITS tablet Take 2,000 Units by mouth daily.    . furosemide (LASIX) 40 MG tablet Take 1 tablet (40 mg total) by mouth 2 (two) times daily. PLEASE CONTACT OFFICE FOR ADDITIONAL REFILLS, 2nd attempt 120 tablet 0  . glucose blood (BAYER CONTOUR NEXT TEST) test strip as directed.    . insulin aspart (NOVOLOG) 100 UNIT/ML injection Use approximately 200 units via insulin pump  DX E11.9   And Z96.41    . lidocaine (LIDODERM) 5 % Place 3 patches onto the skin daily. Remove & Discard patch within 12 hours or as directed by MD 90 patch 6  . lisinopril (PRINIVIL,ZESTRIL) 10 MG tablet Take 1 tablet (10 mg total) by mouth daily. PLEASE CONTACT OFFICE FOR ADDITIONAL  REFILLS 180 tablet 0  . metFORMIN (GLUCOPHAGE) 1000 MG tablet Take 1,000 mg by mouth 2 (two) times daily with a meal.    . Multiple Vitamin (MULTIVITAMIN WITH MINERALS) TABS Take 1 tablet by mouth every morning.     Marland Kitchen Phenylephrine-APAP-Guaifenesin (MUCINEX SINUS-MAX) 10-650-400 MG/20ML LIQD Take 2 capsules by mouth daily. 237 mL 0  . potassium chloride SA (K-DUR,KLOR-CON) 20 MEQ tablet Take 1 tablet (20 mEq total) by mouth daily. PLEASE MAKE APPOINTMENT FOR FURTHER REFILLS 180 tablet 0  . rosuvastatin (CRESTOR) 20 MG tablet Take 20 mg by mouth every evening.    Marland Kitchen aspirin EC 81 MG tablet Take 1 tablet (81 mg total) by mouth daily. 90 tablet 3   No current facility-administered medications for this visit.     Past Medical History:  Diagnosis Date  . Anemia   . Bronchitis   . Diabetes mellitus   . High cholesterol   . Hypertension    takes medicine to protect kidneys, does not have HTN  . Leukocytosis   . Obesity   . Sickle cell trait ([place])   . Thymoma     Past Surgical History:  Procedure Laterality Date  . LEFT HEART CATHETERIZATION WITH CORONARY ANGIOGRAM N/A 05/25/2013   Procedure: LEFT HEART CATHETERIZATION WITH CORONARY ANGIOGRAM;  Surgeon: Troy Sine, MD;  Location: [facility] CATH LAB;  Service: Cardiovascular;  Laterality: N/A;  . LYMPH NODE BIOPSY Right 11/30/2012  Procedure: RIGHT TONSIL BIOPSY WITH FRESH FROZEN ANALYSIS;  Surgeon: Jodi Marble, MD;  Location: [facility];  Service: ENT;  Laterality: Right;  . MEDIASTERNOTOMY N/A 01/12/2013   Procedure: MEDIAN STERNOTOMY;  Surgeon: Gaye Pollack, MD;  Location: [facility];  Service: Thoracic;  Laterality: N/A;  . MEDIASTINOTOMY CHAMBERLAIN MCNEIL Right 11/06/2012   Procedure: MEDIASTINOTOMY CHAMBERLAIN MCNEILPROCEDURE;  Surgeon: Gaye Pollack, MD;  Location: [facility];  Service: Thoracic;  Laterality: Right;  . RESECTION OF A THYMOMA N/A 01/12/2013   Procedure: RESECTION OF A THYMOMA;  Surgeon: Gaye Pollack, MD;  Location: [facility] OR;   Service: Thoracic;  Laterality: N/A;  . TONSILLECTOMY    . TUBAL LIGATION       ROS:   As stated in the HPI and negative for all other systems.  PHYSICAL EXAM BP 116/76 (BP Location: Left Arm, Patient Position: Sitting, Cuff Size: Large)   Pulse 81   Ht 5\' 9"  (1.753 m)   Wt (!) 384 lb 6.4 oz (174.4 kg)   LMP 06/17/2012   SpO2 95%   BMI 56.77 kg/m  GENERAL:  Well appearing and in no distress HEENT:  Pupils equal round and reactive, fundi not visualized, oral mucosa unremarkable NECK:  No jugular venous distention, waveform within normal limits, carotid upstroke brisk and symmetric, no bruits, no thyromegaly LUNGS:  Clear to auscultation bilaterally CHEST:  Sternal scar HEART:  PMI not displaced or sustained,S1 and S2 within normal limits, no S3, no S4, no clicks, no rubs, no murmurs ABD:  Flat, positive bowel sounds normal in frequency in pitch, no bruits, no rebound, no guarding, no midline pulsatile mass, no hepatomegaly, no splenomegaly EXT:  2 plus pulses throughout, trace edema, no cyanosis no clubbing  EKG:  Sinus rhythm, rate 81, LAD, intervals within normal limits, no acute ST-T wave changes. Poor anterior R wave progression. 05/19/2016  ASSESSMENT AND PLAN  DILATED CARDIOMYOPATHY:     I'm going to check a BNP level and an echocardiogram. Unfortunately I can't titrate her medications because of symptomatic low blood pressure.  PROLONGED QT:  QT is normal.  No change in therapy is indicated.  MORBID OBESITY:   This is contributing to her symptoms.  Bariatric surgery was not covered by her insurance. We discussed weight loss measures.  FATIGUE:    I will start with the evaluation above and have a low threshold to evaluate for sleep apnea as she does have some snoring.  DM:  7.1 when last checked.  I will defer to Sarina Ser, MD

## 2016-05-18 ENCOUNTER — Encounter: Payer: Self-pay | Admitting: Cardiology

## 2016-05-18 ENCOUNTER — Ambulatory Visit (INDEPENDENT_AMBULATORY_CARE_PROVIDER_SITE_OTHER): Payer: BLUE CROSS/BLUE SHIELD | Admitting: Cardiology

## 2016-05-18 VITALS — BP 116/76 | HR 81 | Ht 69.0 in | Wt 384.4 lb

## 2016-05-18 DIAGNOSIS — R0600 Dyspnea, unspecified: Secondary | ICD-10-CM

## 2016-05-18 DIAGNOSIS — R5383 Other fatigue: Secondary | ICD-10-CM | POA: Diagnosis not present

## 2016-05-18 DIAGNOSIS — R0609 Other forms of dyspnea: Secondary | ICD-10-CM | POA: Diagnosis not present

## 2016-05-18 MED ORDER — ASPIRIN EC 81 MG PO TBEC: 81.0000 mg | DELAYED_RELEASE_TABLET | Freq: Every day | ORAL | 3 refills | Status: AC

## 2016-05-18 NOTE — Patient Instructions (Signed)
Medication Instructions:   REDUCE aspirin from 325mg  to 81mg  DAILY  Labwork:   BNP today   Testing/Procedures:  Your physician has requested that you have an echocardiogram. Echocardiography is a painless test that uses sound waves to create images of your heart. It provides your doctor with information about the size and shape of your heart and how well your heart's chambers and valves are working. This procedure takes approximately one hour. There are no restrictions for this procedure.   Follow-Up:  1 month with Dr. Percival Spanish (following echocardiogram)    If you need a refill on your cardiac medications before your next appointment, please call your pharmacy.

## 2016-05-19 ENCOUNTER — Encounter: Payer: Self-pay | Admitting: Cardiology

## 2016-05-19 DIAGNOSIS — R0609 Other forms of dyspnea: Principal | ICD-10-CM

## 2016-05-19 DIAGNOSIS — R5383 Other fatigue: Secondary | ICD-10-CM | POA: Insufficient documentation

## 2016-05-19 DIAGNOSIS — R0600 Dyspnea, unspecified: Secondary | ICD-10-CM | POA: Insufficient documentation

## 2016-05-19 LAB — BRAIN NATRIURETIC PEPTIDE: Brain Natriuretic Peptide: 27.7 pg/mL (ref <100)

## 2016-05-19 NOTE — Addendum Note (Signed)
Addended by: Leland Johns A on: 05/19/2016 03:40 PM   Modules accepted: Orders

## 2016-05-26 ENCOUNTER — Encounter: Payer: Self-pay | Admitting: Podiatry

## 2016-05-26 ENCOUNTER — Ambulatory Visit (INDEPENDENT_AMBULATORY_CARE_PROVIDER_SITE_OTHER): Payer: BLUE CROSS/BLUE SHIELD | Admitting: Podiatry

## 2016-05-26 DIAGNOSIS — M79676 Pain in unspecified toe(s): Secondary | ICD-10-CM | POA: Diagnosis not present

## 2016-05-26 DIAGNOSIS — B351 Tinea unguium: Secondary | ICD-10-CM | POA: Diagnosis not present

## 2016-05-26 DIAGNOSIS — E0842 Diabetes mellitus due to underlying condition with diabetic polyneuropathy: Secondary | ICD-10-CM | POA: Diagnosis not present

## 2016-05-26 DIAGNOSIS — Q828 Other specified congenital malformations of skin: Secondary | ICD-10-CM

## 2016-05-26 DIAGNOSIS — L84 Corns and callosities: Secondary | ICD-10-CM

## 2016-05-27 NOTE — Progress Notes (Signed)
   SUBJECTIVE Patient with a history of diabetes mellitus presents to office today complaining of elongated, thickened nails. Pain while ambulating in shoes. Patient is unable to trim their own nails. She also reports calluses to the bilateral forefoot and bilateral 5th toes.   OBJECTIVE General Patient is awake, alert, and oriented x 3 and in no acute distress. Derm Skin is dry and supple bilateral. Negative open lesions or macerations. Remaining integument unremarkable. Nails are tender, long, thickened and dystrophic with subungual debris, consistent with onychomycosis, 1-5 bilateral. No signs of infection noted. Vasc  DP and PT pedal pulses palpable bilaterally. Temperature gradient within normal limits.  Neuro Epicritic and protective threshold sensation diminished bilaterally.  Musculoskeletal Exam No symptomatic pedal deformities noted bilateral. Muscular strength within normal limits.  ASSESSMENT 1. Diabetes Mellitus w/ peripheral neuropathy 2. Onychomycosis of nail due to dermatophyte bilateral 3. Pain in foot bilateral 4.Porokeratotic callus lesions plantar feet x 4  PLAN OF CARE 1. Patient evaluated today. 2. Instructed to maintain good pedal hygiene and foot care. Stressed importance of controlling blood sugar.  3. Mechanical debridement of nails 1-5 bilaterally performed using a nail nipper. Filed with dremel without incident.  4. Excisional debridement of keratotic lesion using a chisel blade was performed without incident.  5. Treated area(s) with Salinocaine and dressed with light dressing. 6. Return to clinic in 3 mos.     Edrick Kins, DPM [facility]  Dr. Edrick Kins, [place]                                        [place], [place] [address]                Office [phone]  Fax [phone]

## 2016-05-31 ENCOUNTER — Ambulatory Visit (HOSPITAL_COMMUNITY): Payer: BLUE CROSS/BLUE SHIELD | Attending: Cardiology

## 2016-05-31 DIAGNOSIS — R0609 Other forms of dyspnea: Secondary | ICD-10-CM | POA: Diagnosis not present

## 2016-05-31 DIAGNOSIS — I081 Rheumatic disorders of both mitral and tricuspid valves: Secondary | ICD-10-CM | POA: Insufficient documentation

## 2016-05-31 DIAGNOSIS — R0600 Dyspnea, unspecified: Secondary | ICD-10-CM

## 2016-05-31 DIAGNOSIS — R5383 Other fatigue: Secondary | ICD-10-CM | POA: Diagnosis present

## 2016-06-03 ENCOUNTER — Telehealth: Payer: Self-pay | Admitting: Cardiology

## 2016-06-03 ENCOUNTER — Telehealth: Payer: Self-pay | Admitting: *Deleted

## 2016-06-03 DIAGNOSIS — R0683 Snoring: Secondary | ICD-10-CM

## 2016-06-03 DIAGNOSIS — R5383 Other fatigue: Secondary | ICD-10-CM

## 2016-06-03 NOTE — Telephone Encounter (Signed)
-----   Message from Minus Breeding, MD sent at 06/01/2016  9:49 AM EDT ----- EF was normal on echo.  I would like to schedule a sleep study to rule out apnea.  She has snoring, daytime fatigue.  Call Ms. Crotteau with the results and send results to Sarina Ser, MD

## 2016-06-03 NOTE — Telephone Encounter (Signed)
Called the patient and left a VM for her to call me back regarding scheduling her sleep study.  First available is 08-05-16.

## 2016-06-03 NOTE — Telephone Encounter (Signed)
Pt aware of her Echo, sleep study ordered and send to scheduler to be schedule.

## 2016-06-04 ENCOUNTER — Telehealth: Payer: Self-pay | Admitting: Cardiology

## 2016-06-04 NOTE — Telephone Encounter (Signed)
F/u Message  Pt returning call to schedule sleep study. Please call back to discuss

## 2016-06-14 ENCOUNTER — Other Ambulatory Visit: Payer: Self-pay | Admitting: Cardiology

## 2016-06-14 NOTE — Telephone Encounter (Signed)
Rx(s) sent to pharmacy electronically.  

## 2016-06-18 ENCOUNTER — Encounter: Payer: Self-pay | Admitting: *Deleted

## 2016-06-25 ENCOUNTER — Ambulatory Visit: Payer: BLUE CROSS/BLUE SHIELD | Admitting: Cardiology

## 2016-07-05 ENCOUNTER — Encounter: Payer: Self-pay | Admitting: Cardiology

## 2016-07-05 ENCOUNTER — Ambulatory Visit (INDEPENDENT_AMBULATORY_CARE_PROVIDER_SITE_OTHER): Payer: BLUE CROSS/BLUE SHIELD | Admitting: Cardiology

## 2016-07-05 VITALS — BP 104/63 | HR 90 | Ht 69.0 in | Wt 387.0 lb

## 2016-07-05 DIAGNOSIS — Z86018 Personal history of other benign neoplasm: Secondary | ICD-10-CM

## 2016-07-05 DIAGNOSIS — E119 Type 2 diabetes mellitus without complications: Secondary | ICD-10-CM | POA: Diagnosis not present

## 2016-07-05 DIAGNOSIS — Z794 Long term (current) use of insulin: Secondary | ICD-10-CM

## 2016-07-05 DIAGNOSIS — Z87898 Personal history of other specified conditions: Secondary | ICD-10-CM

## 2016-07-05 DIAGNOSIS — I10 Essential (primary) hypertension: Secondary | ICD-10-CM

## 2016-07-05 DIAGNOSIS — I428 Other cardiomyopathies: Secondary | ICD-10-CM

## 2016-07-05 DIAGNOSIS — IMO0001 Reserved for inherently not codable concepts without codable children: Secondary | ICD-10-CM

## 2016-07-05 NOTE — Assessment & Plan Note (Signed)
This appears to have resolved on echo May 2018

## 2016-07-05 NOTE — Assessment & Plan Note (Addendum)
On insulin pump

## 2016-07-05 NOTE — Assessment & Plan Note (Signed)
S/P resection [name] 2015

## 2016-07-05 NOTE — Assessment & Plan Note (Signed)
BMI 57, suspected sleep apnea-study scheduled

## 2016-07-05 NOTE — Progress Notes (Signed)
07/05/2016 Kaitlin [name]   [birth year]  [id number]  Primary Physician Sarina Ser, MD Primary Cardiologist: Dr Percival Spanish  HPI:  The patient is a 51 y/o morbidly obese, AAF, with a history of HTN, HLD, DM, and thymoma, s/p resection by Dr. Cyndia Bent on 01/12/13. During her w/u for her thymoma, she was noted to have a right pleural effusion. She was referred to Dr. Elsworth Soho who sent her for an echo. 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 He then referred her to Dr. Percival Spanish.  Cath done May 2015 revealed normal coronaries.   She is in the office today for follow up after recent OV with Dr Percival Spanish. An echo and BNP were done. Her EF is now normal, she does have garde 1 DD and LVH. Her BNP was 27. She is scheduled for a sleep study and will keep this appointment.    Current Outpatient Prescriptions  Medication Sig Dispense Refill  . albuterol (PROVENTIL HFA;VENTOLIN HFA) 108 (90 Base) MCG/ACT inhaler Inhale 1-2 puffs into the lungs every 6 (six) hours as needed for wheezing or shortness of breath. 1 Inhaler 0  . aspirin EC 81 MG tablet Take 1 tablet (81 mg total) by mouth daily. 90 tablet 3  . carvedilol (COREG) 25 MG tablet Take 1 tablet (25 mg total) by mouth 2 (two) times daily with a meal. 120 tablet 3  . cetirizine (ZYRTEC) 10 MG tablet Take 10 mg by mouth daily.    . cholecalciferol (VITAMIN D) 1000 UNITS tablet Take 2,000 Units by mouth daily.    . furosemide (LASIX) 40 MG tablet Take 1 tablet (40 mg total) by mouth daily. 120 tablet 3  . glucose blood (BAYER CONTOUR NEXT TEST) test strip as directed.    . insulin aspart (NOVOLOG) 100 UNIT/ML injection Use approximately 200 units via insulin pump  DX E11.9   And Z96.41    . lidocaine (LIDODERM) 5 % Place 3 patches onto the skin daily. Remove & Discard patch within 12 hours or as directed by MD 90 patch 6  . lisinopril (PRINIVIL,ZESTRIL) 10 MG tablet Take 1 tablet (10 mg total) by mouth daily. PLEASE CONTACT OFFICE FOR  ADDITIONAL REFILLS 180 tablet 0  . metFORMIN (GLUCOPHAGE) 1000 MG tablet Take 1,000 mg by mouth 2 (two) times daily with a meal.    . Multiple Vitamin (MULTIVITAMIN WITH MINERALS) TABS Take 1 tablet by mouth every morning.     Marland Kitchen Phenylephrine-APAP-Guaifenesin (MUCINEX SINUS-MAX) 10-650-400 MG/20ML LIQD Take 2 capsules by mouth daily. 237 mL 0  . potassium chloride SA (K-DUR,KLOR-CON) 20 MEQ tablet Take 1 tablet (20 mEq total) by mouth daily. 90 tablet 3  . rosuvastatin (CRESTOR) 20 MG tablet Take 20 mg by mouth every evening.     No current facility-administered medications for this visit.     Allergies  Allergen Reactions  . Amoxicillin-Pot Clavulanate Diarrhea    Past Medical History:  Diagnosis Date  . Anemia   . Bronchitis   . Diabetes mellitus   . High cholesterol   . Hypertension    takes medicine to protect kidneys, does not have HTN  . Leukocytosis   . Obesity   . Sickle cell trait ([place])   . Thymoma     Social History   Social History  . Marital status: Married    Spouse name: N/A  . Number of children: 3  . Years of education: 14   Occupational History  . Unemployed  Social History Main Topics  . Smoking status: Passive Smoke Exposure - Never Smoker  . Smokeless tobacco: Never Used  . Alcohol use No  . Drug use: No  . Sexual activity: Yes    Birth control/ protection: Surgical   Other Topics Concern  . Not on file   Social History Narrative   Lives at home with mother, husband and son   Caffeine use: none     Family History  Problem Relation Age of Onset  . Heart disease Father        No details  . Diabetes type II Unknown        Family HX  . Breast cancer Unknown      Review of Systems: General: negative for chills, fever, night sweats or weight changes.  Cardiovascular: negative for chest pain, dyspnea on exertion, edema, orthopnea, palpitations, paroxysmal nocturnal dyspnea or shortness of breath Dermatological: negative for  rash Respiratory: negative for cough or wheezing Urologic: negative for hematuria Abdominal: negative for nausea, vomiting, diarrhea, bright red blood per rectum, melena, or hematemesis Neurologic: negative for visual changes, syncope, or dizziness All other systems reviewed and are otherwise negative except as noted above.    Blood pressure 104/63, pulse 90, height 5\' 9"  (1.753 m), weight (!) 387 lb (175.5 kg), last menstrual period 06/17/2012.  General appearance: alert, cooperative, no distress and morbidly obese Neck: no carotid bruit and no JVD Lungs: clear to auscultation bilaterally Extremities: extremities normal, atraumatic, no cyanosis or edema Neurologic: Grossly normal   ASSESSMENT AND PLAN:   NICM (nonischemic cardiomyopathy) ([place]) This appears to have resolved on echo May 2018  Essential hypertension B/P borderline low on current medications  Insulin dependent diabetes mellitus ([place]) On insulin pump  Morbid obesity ([facility]) BMI 57, suspected sleep apnea-study scheduled  History of thymoma S/P resection [name] 2015   PLAN  I encouraged the pt to continue her medications and low salt diet. We discussed starting a walking program for wgt loss. She has a sleep study set up for July. F/U with Dr Percival Spanish in 6 months.   Kerin Ransom PA-C 07/05/2016 2:04 PM

## 2016-07-05 NOTE — Assessment & Plan Note (Signed)
B/P borderline low on current medications

## 2016-07-05 NOTE — Patient Instructions (Signed)
Medication Instructions:  Your physician recommends that you continue on your current medications as directed. Please refer to the Current Medication list given to you today.  Follow-Up: Your physician wants you to follow-up in: 6 MONTHS with Dr. Hochrein. You will receive a reminder letter in the mail two months in advance. If you don't receive a letter, please call our office to schedule the follow-up appointment.   Any Other Special Instructions Will Be Listed Below (If Applicable).     If you need a refill on your cardiac medications before your next appointment, please call your pharmacy.   

## 2016-07-08 ENCOUNTER — Telehealth: Payer: Self-pay | Admitting: Oncology

## 2016-07-08 NOTE — Telephone Encounter (Signed)
Faxed records to [employer] [phone]

## 2016-07-12 ENCOUNTER — Telehealth: Payer: Self-pay | Admitting: Oncology

## 2016-07-12 NOTE — Telephone Encounter (Signed)
faxed records to disability claims recovery [phone]

## 2016-08-05 ENCOUNTER — Telehealth: Payer: Self-pay | Admitting: Cardiology

## 2016-08-05 NOTE — Telephone Encounter (Signed)
Please call,said she had been talking to you.

## 2016-08-05 NOTE — Telephone Encounter (Signed)
Pt says her insurancewould not pay for sleep study at the center where it was scheduled.

## 2016-08-05 NOTE — Telephone Encounter (Signed)
Returned call to patient-patient states her her insurance is not covering her sleep study as the center is out of network, therefore is going to be responsible for a large amount of the cost.  Patient provided locations for "in network"-[facility] and [facility].     Pre cert aware and will follow up.

## 2016-08-05 NOTE — Telephone Encounter (Signed)
Patient aware unable to schedule sleep study through [facility], will contact [facility] to discuss.     Left message to call back.  Patient is aware.

## 2016-08-05 NOTE — Telephone Encounter (Signed)
Spoke to [facility]-their center will be moving in 3 weeks and then they will be going through [employer] as well, unable to get patient in in the next 3 weeks.  Order would need to come through one of their physicians as well, therefore patient would have to be seen prior to ordering/scheduling.    Called patient to update, lmtcb

## 2016-08-06 NOTE — Telephone Encounter (Signed)
Left message at [facility] to call back.

## 2016-08-10 ENCOUNTER — Encounter (HOSPITAL_BASED_OUTPATIENT_CLINIC_OR_DEPARTMENT_OTHER): Payer: BLUE CROSS/BLUE SHIELD

## 2016-08-17 NOTE — Telephone Encounter (Signed)
Called [facility], patient must be seen by a physician in their office prior to having sleep study.     Patient aware.   Patient verbalized she received a letter from her insurance stating that they would not approve a sleep study with the associated diagnoses.      Will speak with pre cert.

## 2016-09-24 NOTE — Telephone Encounter (Signed)
Per pre cert-sleep study was authorized was authorized by insurance.

## 2016-10-12 ENCOUNTER — Other Ambulatory Visit: Payer: BLUE CROSS/BLUE SHIELD

## 2016-10-13 ENCOUNTER — Other Ambulatory Visit (HOSPITAL_BASED_OUTPATIENT_CLINIC_OR_DEPARTMENT_OTHER): Payer: BLUE CROSS/BLUE SHIELD

## 2016-10-13 DIAGNOSIS — D150 Benign neoplasm of thymus: Secondary | ICD-10-CM

## 2016-10-13 DIAGNOSIS — D508 Other iron deficiency anemias: Secondary | ICD-10-CM

## 2016-10-13 DIAGNOSIS — D509 Iron deficiency anemia, unspecified: Secondary | ICD-10-CM

## 2016-10-13 DIAGNOSIS — D4989 Neoplasm of unspecified behavior of other specified sites: Secondary | ICD-10-CM

## 2016-10-13 LAB — COMPREHENSIVE METABOLIC PANEL
ALT: 14 U/L (ref 0–55)
ANION GAP: 8 meq/L (ref 3–11)
AST: 14 U/L (ref 5–34)
Albumin: 3.3 g/dL — ABNORMAL LOW (ref 3.5–5.0)
Alkaline Phosphatase: 99 U/L (ref 40–150)
BILIRUBIN TOTAL: 0.44 mg/dL (ref 0.20–1.20)
BUN: 18.6 mg/dL (ref 7.0–26.0)
CO2: 27 meq/L (ref 22–29)
Calcium: 10.2 mg/dL (ref 8.4–10.4)
Chloride: 108 mEq/L (ref 98–109)
Creatinine: 0.9 mg/dL (ref 0.6–1.1)
EGFR: 89 mL/min/{1.73_m2} — AB (ref >90)
GLUCOSE: 131 mg/dL (ref 70–140)
Potassium: 4 mEq/L (ref 3.5–5.1)
SODIUM: 144 meq/L (ref 136–145)
TOTAL PROTEIN: 6.8 g/dL (ref 6.4–8.3)

## 2016-10-13 LAB — CBC WITH DIFFERENTIAL/PLATELET
BASO%: 1.2 % (ref 0.0–2.0)
BASOS ABS: 0.1 10*3/uL (ref 0.0–0.1)
EOS ABS: 0 10*3/uL (ref 0.0–0.5)
EOS%: 0.2 % (ref 0.0–7.0)
HCT: 38.5 % (ref 34.8–46.6)
HGB: 12.8 g/dL (ref 11.6–15.9)
LYMPH#: 3.8 10*3/uL — AB (ref 0.9–3.3)
LYMPH%: 36.3 % (ref 14.0–49.7)
MCH: 25 pg — ABNORMAL LOW (ref 25.1–34.0)
MCHC: 33.2 g/dL (ref 31.5–36.0)
MCV: 75.2 fL — AB (ref 79.5–101.0)
MONO#: 0.5 10*3/uL (ref 0.1–0.9)
MONO%: 4.6 % (ref 0.0–14.0)
NEUT#: 6 10*3/uL (ref 1.5–6.5)
NEUT%: 57.7 % (ref 38.4–76.8)
PLATELETS: 260 10*3/uL (ref 145–400)
RBC: 5.12 10*6/uL (ref 3.70–5.45)
RDW: 17.6 % — AB (ref 11.2–14.5)
WBC: 10.4 10*3/uL — ABNORMAL HIGH (ref 3.9–10.3)

## 2016-10-13 LAB — IRON AND TIBC
%SAT: 17 % — AB (ref 21–57)
IRON: 53 ug/dL (ref 41–142)
TIBC: 308 ug/dL (ref 236–444)
UIBC: 254 ug/dL (ref 120–384)

## 2016-10-13 LAB — FERRITIN: Ferritin: 90 ng/ml (ref 9–269)

## 2016-10-14 ENCOUNTER — Ambulatory Visit (HOSPITAL_BASED_OUTPATIENT_CLINIC_OR_DEPARTMENT_OTHER): Payer: BLUE CROSS/BLUE SHIELD | Admitting: Oncology

## 2016-10-14 ENCOUNTER — Telehealth: Payer: Self-pay | Admitting: Oncology

## 2016-10-14 VITALS — BP 124/81 | HR 93 | Temp 98.7°F | Resp 18 | Ht 69.0 in | Wt 392.5 lb

## 2016-10-14 DIAGNOSIS — D508 Other iron deficiency anemias: Secondary | ICD-10-CM

## 2016-10-14 DIAGNOSIS — D150 Benign neoplasm of thymus: Secondary | ICD-10-CM

## 2016-10-14 DIAGNOSIS — Z87898 Personal history of other specified conditions: Secondary | ICD-10-CM

## 2016-10-14 DIAGNOSIS — D4989 Neoplasm of unspecified behavior of other specified sites: Secondary | ICD-10-CM

## 2016-10-14 NOTE — Progress Notes (Signed)
Hematology and Oncology Follow Up Visit  Kaitlin [name] [id number] [birth year] 51 y.o. 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 She presented with a large mediastinal mass and shortness of breath. She also has chronic iron deficiency anemia.  Past therapy:  She is status post median sternotomy with resection of the thymoma on 01/12/13.  She is status post Feraheme 510 mg infusion in April 2018.  Current therapy:  Observation and surveillance for her thymoma.  Interim History: Kaitlin [name] presents today for a follow up visit. Since her last visit, she received intravenous iron and tolerated it well. Her energy and performance status is back to normal. She denied any excessive fatigue or tiredness. She denied any recent complaints of shortness of breath or difficulty breathing. She denied any recent respiratory complaints. She denied any recent hospitalizations. She denies any hematochezia, melena or GI complaints. She denied any change in her bowel habits. She is no longer taking oral iron therapy.  She is not reporting any headaches or blurry vision or syncope. She does not report any palpitation or orthopnea. She does not report any nausea or vomiting or abdominal pain. She has not reported any fevers or chills or sweats. She does not report any frequency urgency or hesitancy. Her rest or view of system is unremarkable.  Medications: I have reviewed the patient's current medications.  Current Outpatient Prescriptions  Medication Sig Dispense Refill  . albuterol (PROVENTIL HFA;VENTOLIN HFA) 108 (90 Base) MCG/ACT inhaler Inhale 1-2 puffs into the lungs every 6 (six) hours as needed for wheezing or shortness of breath. 1 Inhaler 0  . aspirin EC 81 MG tablet Take 1 tablet (81 mg total) by mouth daily. 90 tablet 3  . carvedilol (COREG) 25 MG tablet Take 1 tablet (25 mg total) by mouth 2 (two) times daily with a meal. 120 tablet 3  .  cetirizine (ZYRTEC) 10 MG tablet Take 10 mg by mouth daily.    . cholecalciferol (VITAMIN D) 1000 UNITS tablet Take 2,000 Units by mouth daily.    . furosemide (LASIX) 40 MG tablet Take 1 tablet (40 mg total) by mouth daily. 120 tablet 3  . glucose blood (BAYER CONTOUR NEXT TEST) test strip as directed.    . insulin aspart (NOVOLOG) 100 UNIT/ML injection Use approximately 200 units via insulin pump  DX E11.9   And Z96.41    . lidocaine (LIDODERM) 5 % Place 3 patches onto the skin daily. Remove & Discard patch within 12 hours or as directed by MD 90 patch 6  . lisinopril (PRINIVIL,ZESTRIL) 10 MG tablet Take 1 tablet (10 mg total) by mouth daily. PLEASE CONTACT OFFICE FOR ADDITIONAL REFILLS 180 tablet 0  . metFORMIN (GLUCOPHAGE) 1000 MG tablet Take 1,000 mg by mouth 2 (two) times daily with a meal.    . Multiple Vitamin (MULTIVITAMIN WITH MINERALS) TABS Take 1 tablet by mouth every morning.     Marland Kitchen Phenylephrine-APAP-Guaifenesin (MUCINEX SINUS-MAX) 10-650-400 MG/20ML LIQD Take 2 capsules by mouth daily. 237 mL 0  . potassium chloride SA (K-DUR,KLOR-CON) 20 MEQ tablet Take 1 tablet (20 mEq total) by mouth daily. 90 tablet 3  . rosuvastatin (CRESTOR) 20 MG tablet Take 20 mg by mouth every evening.     No current facility-administered medications for this visit.     Allergies:  Allergies  Allergen Reactions  . Amoxicillin-Pot Clavulanate Diarrhea     Physical Exam: Blood pressure 124/81, pulse 93, temperature 98.7  F (37.1 C), temperature source Oral, resp. rate 18, height 5\' 9"  (1.753 m), weight (!) 392 lb 8 oz (178 kg), last menstrual period 06/17/2012, SpO2 97 %. ECOG: 1 General appearance: Alert, awake woman without distress. Head: Normocephalic, without obvious abnormality, atraumatic.  No oral ulcers. Neck: no adenopathy.  Lymph nodes: Cervical, supraclavicular, and axillary nodes normal. Heart:regular rate and rhythm, S1, S2 normal, no murmur, click, rub or gallop Lung:chest clear, no  wheezing, rales, normal symmetric air entry Abdomin: soft, non-tender, without masses or organomegaly. No rebound or guarding. EXT:no erythema, induration, or nodules. No edema noted.   Lab Results: Lab Results  Component Value Date   WBC 10.4 (H) 10/13/2016   HGB 12.8 10/13/2016   HCT 38.5 10/13/2016   MCV 75.2 (L) 10/13/2016   PLT 260 10/13/2016     Chemistry      Component Value Date/Time   NA 144 10/13/2016 1010   K 4.0 10/13/2016 1010   CL 107 02/10/2015 2011   CL 108 (H) 03/29/2012 0945   CO2 27 10/13/2016 1010   BUN 18.6 10/13/2016 1010   CREATININE 0.9 10/13/2016 1010      Component Value Date/Time   CALCIUM 10.2 10/13/2016 1010   ALKPHOS 99 10/13/2016 1010   AST 14 10/13/2016 1010   ALT 14 10/13/2016 1010   BILITOT 0.44 10/13/2016 1010      Results for Kaitlin [name] (MRN [id number]) as of 10/14/2016 10:58  Ref. Range 10/13/2016 10:10  Iron Latest Ref Range: 41 - 142 ug/dL 53  UIBC Latest Ref Range: 120 - 384 ug/dL 254  TIBC Latest Ref Range: 236 - 444 ug/dL 308  %SAT Latest Ref Range: 21 - 57 % 17 (L)  Ferritin Latest Ref Range: 9 - 269 ng/ml 90      IImpression and Plan:  51 year old woman with the following issues:  1. 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 Her pathological staging was T2 N0. She did have a focally involved margin and radiation was deferred for salvage purposes.   CT scan from April 2018 showed no evidence of recurrence. The plan is to continue with active surveillance and you salvage radiation therapy upon recurrence.   She is to have a CT scan in April 2019 and her last CT scan will be done in April 2020.  2. Microcytosis and mild anemia: likely due to mild iron deficiency anemia. She is status post intravenous iron completed in April 2018.  Her hemoglobin and iron studies are within normal range at this time and does not require any further therapy. We will repeat iron studies in 6 months for  follow-up.  3. Colon cancer screening: I recommended she pursue colonoscopy as she is at the age that requires screening.  4. Follow-up: Will be in 6 months for a repeat iron studies and a CT scan.   Zola Button, MD 10/4/201811:01 AM

## 2016-10-14 NOTE — Telephone Encounter (Signed)
Scheduled appt per 10/4 los [facility] radiology to contact patient with ct schedule - Gave patient AVS and calender per los.

## 2016-10-25 ENCOUNTER — Other Ambulatory Visit: Payer: Self-pay | Admitting: Physician Assistant

## 2016-10-26 ENCOUNTER — Other Ambulatory Visit: Payer: Self-pay | Admitting: Family Medicine

## 2016-10-26 DIAGNOSIS — Z1231 Encounter for screening mammogram for malignant neoplasm of breast: Secondary | ICD-10-CM

## 2016-11-19 ENCOUNTER — Ambulatory Visit
Admission: RE | Admit: 2016-11-19 | Discharge: 2016-11-19 | Disposition: A | Discharge status: Home / Self Care | Payer: BLUE CROSS/BLUE SHIELD | Source: Ambulatory Visit | Attending: Family Medicine | Admitting: Family Medicine

## 2016-11-19 DIAGNOSIS — Z1231 Encounter for screening mammogram for malignant neoplasm of breast: Secondary | ICD-10-CM

## 2016-11-24 ENCOUNTER — Telehealth: Payer: Self-pay | Admitting: Cardiology

## 2016-11-24 ENCOUNTER — Other Ambulatory Visit: Payer: Self-pay | Admitting: *Deleted

## 2016-11-24 MED ORDER — CARVEDILOL 25 MG PO TABS: 25.0000 mg | ORAL_TABLET | Freq: Two times a day (BID) | ORAL | 3 refills | Status: DC

## 2016-11-24 NOTE — Telephone Encounter (Signed)
New message     *STAT* If patient is at the pharmacy, call can be transferred to refill team.   1. Which medications need to be refilled? (please list name of each medication and dose if known) carvedilol (COREG) 25 MG tablet  2. Which pharmacy/location (including street and city if local pharmacy) is medication to be sent to?[facility] on [address]    3. Do they need a 30 day or 90 day supply? 90 supply

## 2016-12-31 ENCOUNTER — Ambulatory Visit: Payer: BLUE CROSS/BLUE SHIELD | Admitting: Podiatry

## 2017-01-10 ENCOUNTER — Encounter: Payer: Self-pay | Admitting: Podiatry

## 2017-01-10 ENCOUNTER — Ambulatory Visit: Payer: BLUE CROSS/BLUE SHIELD | Admitting: Podiatry

## 2017-01-10 DIAGNOSIS — B351 Tinea unguium: Secondary | ICD-10-CM

## 2017-01-10 DIAGNOSIS — E0842 Diabetes mellitus due to underlying condition with diabetic polyneuropathy: Secondary | ICD-10-CM | POA: Diagnosis not present

## 2017-01-10 DIAGNOSIS — M79676 Pain in unspecified toe(s): Secondary | ICD-10-CM

## 2017-01-10 DIAGNOSIS — Q828 Other specified congenital malformations of skin: Secondary | ICD-10-CM | POA: Diagnosis not present

## 2017-04-14 ENCOUNTER — Inpatient Hospital Stay: Payer: BLUE CROSS/BLUE SHIELD | Attending: Oncology

## 2017-04-14 DIAGNOSIS — D509 Iron deficiency anemia, unspecified: Secondary | ICD-10-CM | POA: Insufficient documentation

## 2017-04-14 DIAGNOSIS — D150 Benign neoplasm of thymus: Secondary | ICD-10-CM

## 2017-04-14 DIAGNOSIS — D4989 Neoplasm of unspecified behavior of other specified sites: Secondary | ICD-10-CM

## 2017-04-14 DIAGNOSIS — D508 Other iron deficiency anemias: Secondary | ICD-10-CM

## 2017-04-14 LAB — COMPREHENSIVE METABOLIC PANEL
ALT: 17 U/L (ref 0–55)
AST: 16 U/L (ref 5–34)
Albumin: 3.2 g/dL — ABNORMAL LOW (ref 3.5–5.0)
Alkaline Phosphatase: 93 U/L (ref 40–150)
Anion gap: 7 (ref 3–11)
BUN: 15 mg/dL (ref 7–26)
CHLORIDE: 111 mmol/L — AB (ref 98–109)
CO2: 27 mmol/L (ref 22–29)
Calcium: 10.1 mg/dL (ref 8.4–10.4)
Creatinine, Ser: 0.94 mg/dL (ref 0.60–1.10)
GFR calc Af Amer: >60 mL/min (ref >60)
Glucose, Bld: 110 mg/dL (ref 70–140)
Potassium: 4.3 mmol/L (ref 3.5–5.1)
SODIUM: 145 mmol/L (ref 136–145)
Total Bilirubin: 0.3 mg/dL (ref 0.2–1.2)
Total Protein: 6.7 g/dL (ref 6.4–8.3)

## 2017-04-14 LAB — CBC WITH DIFFERENTIAL/PLATELET
BASOS ABS: 0.1 10*3/uL (ref 0.0–0.1)
BASOS PCT: 1 %
Eosinophils Absolute: 0 10*3/uL (ref 0.0–0.5)
Eosinophils Relative: 0 %
HEMATOCRIT: 37.5 % (ref 34.8–46.6)
Hemoglobin: 12.3 g/dL (ref 11.6–15.9)
LYMPHS PCT: 38 %
Lymphs Abs: 3.6 10*3/uL — ABNORMAL HIGH (ref 0.9–3.3)
MCH: 25 pg — ABNORMAL LOW (ref 25.1–34.0)
MCHC: 33 g/dL (ref 31.5–36.0)
MCV: 75.9 fL — ABNORMAL LOW (ref 79.5–101.0)
Monocytes Absolute: 0.5 10*3/uL (ref 0.1–0.9)
Monocytes Relative: 5 %
NEUTROS ABS: 5.2 10*3/uL (ref 1.5–6.5)
Neutrophils Relative %: 56 %
PLATELETS: 253 10*3/uL (ref 145–400)
RBC: 4.94 MIL/uL (ref 3.70–5.45)
RDW: 18 % — ABNORMAL HIGH (ref 11.2–14.5)
WBC: 9.4 10*3/uL (ref 3.9–10.3)

## 2017-04-14 LAB — IRON AND TIBC
IRON: 38 ug/dL — AB (ref 41–142)
Saturation Ratios: 13 % — ABNORMAL LOW (ref 21–57)
TIBC: 288 ug/dL (ref 236–444)
UIBC: 249 ug/dL

## 2017-04-14 LAB — FERRITIN: FERRITIN: 56 ng/mL (ref 9–269)

## 2017-04-21 ENCOUNTER — Telehealth: Payer: Self-pay | Admitting: *Deleted

## 2017-04-21 ENCOUNTER — Ambulatory Visit: Payer: BLUE CROSS/BLUE SHIELD | Admitting: Oncology

## 2017-04-21 NOTE — Telephone Encounter (Signed)
Patient calling to cancel appt today, states she has a stomach bug.

## 2017-04-21 NOTE — Telephone Encounter (Signed)
No note

## 2017-04-21 NOTE — Telephone Encounter (Signed)
Patient calling to cancel her appt today with dr Alen Blew, states she is nauseated, has diarrhea and is running a fever. She is taking imodium, suggested she take tylenol for fever, and increase hydration, preferably sport drinks to replace electrolytes. appt was re-scheduled.

## 2017-04-22 ENCOUNTER — Telehealth: Payer: Self-pay | Admitting: Oncology

## 2017-04-22 NOTE — Telephone Encounter (Signed)
Scheduled appt per 4/11 sch msg - left voicemail for patient regarding appts that were added.

## 2017-05-12 ENCOUNTER — Ambulatory Visit: Payer: BLUE CROSS/BLUE SHIELD | Admitting: Oncology

## 2017-05-26 ENCOUNTER — Telehealth: Payer: Self-pay | Admitting: Oncology

## 2017-05-26 ENCOUNTER — Inpatient Hospital Stay: Payer: BLUE CROSS/BLUE SHIELD | Attending: Oncology | Admitting: Oncology

## 2017-05-26 VITALS — BP 140/82 | HR 93 | Temp 98.7°F | Resp 22 | Ht 69.0 in | Wt 388.0 lb

## 2017-05-26 DIAGNOSIS — D508 Other iron deficiency anemias: Secondary | ICD-10-CM

## 2017-05-26 DIAGNOSIS — N920 Excessive and frequent menstruation with regular cycle: Secondary | ICD-10-CM | POA: Diagnosis not present

## 2017-05-26 DIAGNOSIS — Z87898 Personal history of other specified conditions: Secondary | ICD-10-CM

## 2017-05-26 DIAGNOSIS — D500 Iron deficiency anemia secondary to blood loss (chronic): Secondary | ICD-10-CM

## 2017-05-26 DIAGNOSIS — D4989 Neoplasm of unspecified behavior of other specified sites: Secondary | ICD-10-CM

## 2017-05-26 DIAGNOSIS — D150 Benign neoplasm of thymus: Secondary | ICD-10-CM

## 2017-05-26 NOTE — Progress Notes (Signed)
Hematology and Oncology Follow Up Visit  Kaitlin [name] [id number] 1965-03-14 53 y.o. 05/26/2017 9:14 AM   Principle Diagnosis: 52 year old woman with:  1. 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 She presented with a large mediastinal mass and shortness of breath.  2.  Iron deficiency anemia related to menstrual blood losses.  Past therapy:  She is status post median sternotomy with resection of the thymoma on 01/12/13.  She is status post Feraheme 510 mg infusion in April 2018.  Current therapy:  Active  surveillance for her thymoma.  Interim History: Kaitlin [name] is here for a follow-up visit.  She reports no major changes since last visit.  She does have issues with sinus congestion and postnasal drip.  She denies any shortness of breath or difficulty breathing.  She denies any hematochezia or melena.  He has not had a menstrual cycle for last few years.  She remains active and attends to activities of daily living.  She is not reporting any headaches or blurry vision or syncope.  She denies any fevers, chills or sweats.  She does not report any chest pain.  She does not report any palpitation or orthopnea.  She denies any cough, wheezing or hemoptysis.  She does not report any nausea or vomiting or abdominal pain. She does not report any frequency urgency or hesitancy.  She denies any arthralgias or myalgias.  She denies any skin rashes or lesions.  She denies any lymphadenopathy or petechiae.  The remaining review of system is negative.  Medications: I have reviewed the patient's current medications.  Current Outpatient Medications  Medication Sig Dispense Refill  . albuterol (PROVENTIL HFA;VENTOLIN HFA) 108 (90 Base) MCG/ACT inhaler Inhale 1-2 puffs into the lungs every 6 (six) hours as needed for wheezing or shortness of breath. 1 Inhaler 0  . aspirin EC 81 MG tablet Take 1 tablet (81 mg total) by mouth daily. 90 tablet 3  . carvedilol (COREG) 25 MG tablet Take 1 tablet (25 mg total)  2 (two) times daily with a meal by mouth. 180 tablet 3  . cetirizine (ZYRTEC) 10 MG tablet Take 10 mg by mouth daily.    . cholecalciferol (VITAMIN D) 1000 UNITS tablet Take 2,000 Units by mouth daily.    . furosemide (LASIX) 40 MG tablet Take 1 tablet (40 mg total) by mouth daily. 120 tablet 3  . glucose blood (BAYER CONTOUR NEXT TEST) test strip as directed.    . insulin aspart (NOVOLOG) 100 UNIT/ML injection Use approximately 200 units via insulin pump  DX E11.9   And Z96.41    . lidocaine (LIDODERM) 5 % Place 3 patches onto the skin daily. Remove & Discard patch within 12 hours or as directed by MD 90 patch 6  . lisinopril (PRINIVIL,ZESTRIL) 10 MG tablet Take 1 tablet (10 mg total) by mouth daily. PLEASE CONTACT OFFICE FOR ADDITIONAL REFILLS 180 tablet 0  . metFORMIN (GLUCOPHAGE) 1000 MG tablet Take 1,000 mg by mouth 2 (two) times daily with a meal.    . Multiple Vitamin (MULTIVITAMIN WITH MINERALS) TABS Take 1 tablet by mouth every morning.     Marland Kitchen Phenylephrine-APAP-Guaifenesin (MUCINEX SINUS-MAX) 10-650-400 MG/20ML LIQD Take 2 capsules by mouth daily. 237 mL 0  . potassium chloride SA (K-DUR,KLOR-CON) 20 MEQ tablet Take 1 tablet (20 mEq total) by mouth daily. 90 tablet 3  . rosuvastatin (CRESTOR) 20 MG tablet Take 20 mg by mouth every evening.     No current facility-administered medications for this  visit.     Allergies:  Allergies  Allergen Reactions  . Amoxicillin-Pot Clavulanate Diarrhea     Physical Exam: Blood pressure 140/82, pulse 93, temperature 98.7 F (37.1 C), temperature source Oral, resp. rate (!) 22, height 5\' 9"  (1.753 m), weight (!) 388 lb (176 kg), last menstrual period 06/17/2012, SpO2 97 %.   ECOG: 1 General appearance: Well-appearing woman without distress. Head: Traumatic without any lesions. Oropharynx: Without any thrush or ulcers. Eyes: No scleral icterus. Lymph nodes: No lymphadenopathy noted cervical, supraclavicular, and axillary nodes Heart:  Regular rate and rhythm without any murmurs or gallops. Lung: Clear to auscultation without any rhonchi, wheezes or dullness to percussion. Abdomin: Soft, nontender without any rebound or guarding. Musculoskeletal: No joint deformity or effusion. Skin: No rashes or lesions.    Lab Results: Lab Results  Component Value Date   WBC 9.4 04/14/2017   HGB 12.3 04/14/2017   HCT 37.5 04/14/2017   MCV 75.9 (L) 04/14/2017   PLT 253 04/14/2017     Chemistry      Component Value Date/Time   NA 145 04/14/2017 1007   NA 144 10/13/2016 1010   K 4.3 04/14/2017 1007   K 4.0 10/13/2016 1010   CL 111 (H) 04/14/2017 1007   CL 108 (H) 03/29/2012 0945   CO2 27 04/14/2017 1007   CO2 27 10/13/2016 1010   BUN 15 04/14/2017 1007   BUN 18.6 10/13/2016 1010   CREATININE 0.94 04/14/2017 1007   CREATININE 0.9 10/13/2016 1010      Component Value Date/Time   CALCIUM 10.1 04/14/2017 1007   CALCIUM 10.2 10/13/2016 1010   ALKPHOS 93 04/14/2017 1007   ALKPHOS 99 10/13/2016 1010   AST 16 04/14/2017 1007   AST 14 10/13/2016 1010   ALT 17 04/14/2017 1007   ALT 14 10/13/2016 1010   BILITOT 0.3 04/14/2017 1007   BILITOT 0.44 10/13/2016 1010      Results for Kaitlin [name], Kaitlin [name] (MRN [id number]) as of 05/26/2017 09:13  Ref. Range 04/14/2017 10:07  Iron Latest Ref Range: 41 - 142 ug/dL 38 (L)  UIBC Latest Units: ug/dL 249  TIBC Latest Ref Range: 236 - 444 ug/dL 288  Saturation Ratios Latest Ref Range: 21 - 57 % 13 (L)  Ferritin Latest Ref Range: 9 - 269 ng/mL 56      Impression and Plan:  52 year old woman with:  1.  T2N0 thymoma diagnosed in 2014 after presenting with a mass measuring 10.4 cm.  He is status post surgical resection on 01/12/2013.  She remains on active surveillance with annual imaging studies although she has not had a CT scan in 2019.  She has no clinical signs or symptoms of disease relapse and will arrange for CT scan for this year and repeat another scan in 12 months.  No further  imaging studies will be needed after the year 2020.  2.  Iron deficiency anemia: Her iron studies reviewed from April 2019 and showed mild iron deficiency.  I have asked her to resume oral iron therapy at this time.  Her hemoglobin is adequate at this time and does not require intravenous iron.  3. Colon cancer screening: She has a colonoscopy scheduled in the immediate future.  4. Follow-up: Will be in 6 months to repeat iron studies.  15  minutes was spent with the patient face-to-face today.  More than 50% of time was dedicated to patient counseling, education and coordination of her care.    Zola Button, MD 5/16/20199:14 AM

## 2017-05-26 NOTE — Telephone Encounter (Signed)
Appointments scheduled AVs/Calendar printed per 5/16 los

## 2017-06-02 ENCOUNTER — Ambulatory Visit (HOSPITAL_COMMUNITY)
Admission: RE | Admit: 2017-06-02 | Discharge: 2017-06-02 | Disposition: A | Discharge status: Home / Self Care | Payer: BLUE CROSS/BLUE SHIELD | Source: Ambulatory Visit | Attending: Oncology | Admitting: Oncology

## 2017-06-02 DIAGNOSIS — D4989 Neoplasm of unspecified behavior of other specified sites: Secondary | ICD-10-CM

## 2017-06-02 DIAGNOSIS — D150 Benign neoplasm of thymus: Secondary | ICD-10-CM | POA: Diagnosis present

## 2017-06-03 ENCOUNTER — Other Ambulatory Visit: Payer: Self-pay | Admitting: Gastroenterology

## 2017-06-03 ENCOUNTER — Telehealth: Payer: Self-pay | Admitting: *Deleted

## 2017-06-03 ENCOUNTER — Other Ambulatory Visit: Payer: Self-pay | Admitting: Oncology

## 2017-06-03 DIAGNOSIS — D508 Other iron deficiency anemias: Secondary | ICD-10-CM

## 2017-06-03 DIAGNOSIS — D4989 Neoplasm of unspecified behavior of other specified sites: Secondary | ICD-10-CM

## 2017-06-03 DIAGNOSIS — D150 Benign neoplasm of thymus: Secondary | ICD-10-CM

## 2017-06-17 ENCOUNTER — Encounter: Payer: Self-pay | Admitting: Podiatry

## 2017-06-17 ENCOUNTER — Ambulatory Visit: Payer: BLUE CROSS/BLUE SHIELD | Admitting: Podiatry

## 2017-06-17 DIAGNOSIS — M2041 Other hammer toe(s) (acquired), right foot: Secondary | ICD-10-CM | POA: Diagnosis not present

## 2017-06-17 DIAGNOSIS — L84 Corns and callosities: Secondary | ICD-10-CM

## 2017-06-17 DIAGNOSIS — E1142 Type 2 diabetes mellitus with diabetic polyneuropathy: Secondary | ICD-10-CM | POA: Diagnosis not present

## 2017-06-17 DIAGNOSIS — M2042 Other hammer toe(s) (acquired), left foot: Secondary | ICD-10-CM

## 2017-06-17 DIAGNOSIS — B351 Tinea unguium: Secondary | ICD-10-CM

## 2017-06-17 MED ORDER — CICLOPIROX 8 % EX SOLN: Freq: Every day | CUTANEOUS | 11 refills | Status: DC

## 2017-06-17 NOTE — Progress Notes (Addendum)
Subjective: Patient presents today with diabetes, diabetic neuropathy and cc of painful, discolored, thick toenails and painful callus/corn which interfere with activities of daily living. Pain is aggravated when wearing enclosed shoe gear. Pain is relieved with periodic professional debridement.  Objective:  Vascular Examination: Capillary refill time <3 seconds x 10 digits Dorsalis pedis and Posterior tibial pulses present b/l No digital hair x 10 digits Skin temperature warm b/l  Dermatological Examination: Skin with normal turgor, texture and tone b/l Toenails 1-5 b/l discolored, thick, dystrophic with subungual debris and pain with palpation to nailbeds due to thickness of nails. Hyperkeratotic lesions: submetatarsal head 1 b/l Dorsal 5th PIPJ b/l  Musculoskeletal: Muscle strength 5/5 to all LE muscle groups  Neurological: Sensation diminished with 10 gram monofilament. Vibratory sensation diminished  Assessment: 1. Painful onychomycosis toenails 1-5 b/l 2. Callus submetatarsal head 1 b/l 3. Corns dorsal 5th PIPJ b/l 3. Hammertoe deformity b/l 4. NIDDM with Diabetic neuropathy  Plan: 1. Continue diabetic foot care principles.  2. Toenails 1-5 b/l were debrided in length and girth without iatrogenic bleeding. Prescription written for Ciclopirox Solution.  Apply 1 coat to each toenail once daily for 48 weeks. Remove once weekly with polish remover. 3. Hyperkeratotic lesions pared with sterile chisel blade submetatarsal head 1 b/l, dorsal 5th PIPJ b/l 4. Patient to continue soft, supportive shoe gear 5. Initiate diabetic shoes with Pedorthist Rick; paperwork completed. 6. Patient to report any pedal injuries to medical professional  7. Follow up 3 months.  8. Patient/POA to call should there be a concern in the interim.

## 2017-06-20 ENCOUNTER — Encounter (HOSPITAL_COMMUNITY): Payer: Self-pay | Admitting: *Deleted

## 2017-06-20 NOTE — H&P (Signed)
History of Present Illness  General:  52 year old female was referred for screening colonoscopy. Labs from 1/19 showed hemoglobin 12.3, MCV 77.9, RDW 17.6, platelet 329. She has Bms every few days, stools vary from loose to formed, denies blood in stool or black stools. Denies unintentional weight loss or loss of appetite, she has nausea-resolves spontaneously, depends on BS levels. There is no family history of colon cancer, no prior colonoscopy. She has RUQ pain, worsens with bending, seems positional, it was constant for several years but has gotten better recently. Denies difficulty or pain on swallowing. She denies acid reflux.   Current Medications  Taking   Aspirin 81 MG Tablet Delayed Release 1 tablet Orally Once a day   Vitamin D3 Maximum Strength(Cholecalciferol) 5000 UNIT Capsule 1 capsule Orally Once a day   Multivitamin . Tablet 1 tablet by mouth Once daily   Biotin 10 MG Tablet 1 tablet Orally Once a day   Potassium Chloride 20 MEQ Packet 1 tablet with food Orally Once a day, Notes: Dr. Ree Shay Chair as directed , Notes: Dx: E66.01   PreserVision AREDS - Tablet Orally   Acetaminophen 500 MG Capsule 2 capsule as needed Orally every 6 hrs prn   Crestor(Rosuvastatin Calcium) 20 MG Tablet 1 tablet Orally Once a day, Notes: Dr. Percival Spanish   Metformin HCl 1000 MG Tablet 1 tablet with meals Orally Twice a day, Notes: [place]   NovoLog(Insulin Aspart) 100 UNIT/ML Solution Subcutaneous , Notes: has pump/ Dr. Awilda Metro in [place]   Lisinopril 10 MG Tablet 1 tablet Orally Once a day, Notes: Dr. Percival Spanish   Furosemide 40 MG Tablet 1 tablet Orally twice a day, Notes: Dr. Percival Spanish   Carvedilol 25 MG Tablet Orally twice a day, Notes: Dr. Rich Reining Allergy(Fexofenadine HCl) 180 MG Tablet 1 tablet as needed Orally Once a day   Benzonatate 200 MG Capsule 1 capsule Orally Three times a day   Azithromycin 250 MG Tablet 2 tablets on the first day, then 1 tablet daily for 4 days  Orally Once a day   PredniSONE 10 MG (21) Tablet Therapy Pack as directed Orally once daily   Calcium 1 chewable Oral Once a day   Iron 1 tablet Orally Once a day   Not-Taking   Amoxicillin 875 MG Tablet 1 tablet Orally every 12 hrs   Mucinex Maximum Strength(guaiFENesin CR) 1200 MG Tablet Extended Release 12 Hour 1 tablet as needed Orally every 12 hrs   Medication List reviewed and reconciled with the patient    Past Medical History  Postmenopausal.   hyperlipidemia - Dr Percival Spanish.   HTN - Dr Percival Spanish.   DM - Dr Olin Hauser.   thymoma s/p excision [name] 2015 - Dr Alen Blew.    Surgical History  tonsillectomy   tumor removed from heart    Family History  Father: unknown  Mother: alive, diagnosed with Diabetes, Hypertension, Coronary artery disease  pt is an only child  neg family hx of colon cancer/polyps or liver disease.   Social History  General:  Tobacco use  cigarettes: Never smoked Tobacco history last updated 05/03/2017 no EXPOSURE TO PASSIVE SMOKE.  no Alcohol.  Caffeine: soda, very little.  no Recreational drug use.  DIET: good.  Exercise: is in PT right now for L sided weakness.  DENTAL CARE: good.  Marital Status: single, widowed.  Children: Boys, 1, girls, 2.  EDUCATION: Some College.  OCCUPATION: employed, is out on LTD - waiting on her disability  case with [employer] - CHF, and DM.  COMMUNICATION BARRIERS: none.    Allergies  Augmentin: stomach upset - Side Effects   Hospitalization/Major Diagnostic Procedure  not in the past year 05/2017   Review of Systems  GI PROCEDURE:  no Pacemaker/ AICD, no. no Artificial heart valves. no MI/heart attack. no Abnormal heart rhythm. no Angina. no CVA. Hypertension YES. no Hypotension. no Asthma, COPD. no Sleep apnea. no Seizure disorders. no Artificial joints. no Severe DJD. Diabetes YES. no Significant headaches. no Vertigo. no Depression/anxiety. no Abnormal bleeding. no Kidney Disease. no Liver disease, no.  no Chance of pregnancy. no Blood transfusion. no Method of Birth Control. no Birth control pills.      Vital Signs  Wt 388.5, Wt change 6.7 lb, Ht 67, BMI 60.84, Temp 98.0, Pulse sitting 84, BP sitting 128/86.   Examination  Gastroenterology:: GENERAL APPEARANCE: Well developed,morbidly obese,pleasant, no acute distress.  EYES: Lids and conjunctiva normal. Sclera normal, pupils equal and reactive .  ORAL CAVITY: Lips, teeth and gums are normal. Pharynx, tongue, mucosa normal .  SCLERA: anicteric .  NECK Full ROM, trachea midline, no thyromegaly or masses .  CARDIOVASCULAR PMI LS border. Normal RRR w/o murmers or gallops. No peripheral edema .  RESPIRATORY Breath sounds normal. Respiration even and unlabored .  ABDOMEN No masses palpated. Liver and spleen not palpated, normal. Bowel sounds normal, Abdomen not distended .  EXTREMITIES: No edema, pulses intact .  NEURO: normal strength and reflexes, cranial nerves II-XII grossly intact, normal gait .  PSYCH: mood/affect normal .     Assessments   1. Screen for colon cancer - Z12.11 (Primary)   Treatment  1. Screen for colon cancer  IMAGING: Colonoscopy    Whitfield,Dia 06/03/2017 11:31:04 AM > spoke with Jill-scheduled for 06/21/17-prep instructions reviewed with pt.   Notes: The risk and benefits of the procedure we discussed with the patient in details. She understands and verbalizes consent. She will be given written instructions, prescription for preparation and will be scheduled for outpatient colonoscopy at the hospital.  Referral To: Reason:colon-propofol-spoke with [id number]

## 2017-06-21 ENCOUNTER — Ambulatory Visit (HOSPITAL_COMMUNITY): Payer: BLUE CROSS/BLUE SHIELD | Admitting: Anesthesiology

## 2017-06-21 ENCOUNTER — Other Ambulatory Visit: Payer: Self-pay

## 2017-06-21 ENCOUNTER — Encounter (HOSPITAL_COMMUNITY)
Admission: RE | Disposition: A | Discharge status: Home / Self Care | Payer: Self-pay | Source: Ambulatory Visit | Attending: Gastroenterology

## 2017-06-21 ENCOUNTER — Ambulatory Visit (HOSPITAL_COMMUNITY)
Admission: RE | Admit: 2017-06-21 | Discharge: 2017-06-21 | Disposition: A | Discharge status: Home / Self Care | Payer: BLUE CROSS/BLUE SHIELD | Source: Ambulatory Visit | Attending: Gastroenterology | Admitting: Gastroenterology

## 2017-06-21 ENCOUNTER — Encounter (HOSPITAL_COMMUNITY): Payer: Self-pay

## 2017-06-21 DIAGNOSIS — E785 Hyperlipidemia, unspecified: Secondary | ICD-10-CM | POA: Diagnosis not present

## 2017-06-21 DIAGNOSIS — Z7982 Long term (current) use of aspirin: Secondary | ICD-10-CM | POA: Diagnosis not present

## 2017-06-21 DIAGNOSIS — I10 Essential (primary) hypertension: Secondary | ICD-10-CM | POA: Insufficient documentation

## 2017-06-21 DIAGNOSIS — Z1211 Encounter for screening for malignant neoplasm of colon: Secondary | ICD-10-CM | POA: Diagnosis present

## 2017-06-21 DIAGNOSIS — Z6841 Body Mass Index (BMI) 40.0 and over, adult: Secondary | ICD-10-CM | POA: Diagnosis not present

## 2017-06-21 DIAGNOSIS — Z794 Long term (current) use of insulin: Secondary | ICD-10-CM | POA: Insufficient documentation

## 2017-06-21 DIAGNOSIS — E119 Type 2 diabetes mellitus without complications: Secondary | ICD-10-CM | POA: Insufficient documentation

## 2017-06-21 DIAGNOSIS — D120 Benign neoplasm of cecum: Secondary | ICD-10-CM | POA: Insufficient documentation

## 2017-06-21 DIAGNOSIS — D123 Benign neoplasm of transverse colon: Secondary | ICD-10-CM | POA: Insufficient documentation

## 2017-06-21 DIAGNOSIS — Z79899 Other long term (current) drug therapy: Secondary | ICD-10-CM | POA: Insufficient documentation

## 2017-06-21 DIAGNOSIS — D122 Benign neoplasm of ascending colon: Secondary | ICD-10-CM | POA: Insufficient documentation

## 2017-06-21 HISTORY — PX: POLYPECTOMY: SHX5525

## 2017-06-21 HISTORY — PX: COLONOSCOPY: SHX5424

## 2017-06-21 LAB — GLUCOSE, CAPILLARY: GLUCOSE-CAPILLARY: 148 mg/dL — AB (ref 65–99)

## 2017-06-21 SURGERY — COLONOSCOPY
Anesthesia: Monitor Anesthesia Care

## 2017-06-21 MED ORDER — PROPOFOL 10 MG/ML IV BOLUS
INTRAVENOUS | Status: AC
  Filled 2017-06-21: qty 60

## 2017-06-21 MED ORDER — LACTATED RINGERS IV SOLN
INTRAVENOUS | Status: DC
  Administered 2017-06-21: 08:00:00 via INTRAVENOUS

## 2017-06-21 MED ORDER — SODIUM CHLORIDE 0.9 % IV SOLN: INTRAVENOUS | Status: DC

## 2017-06-21 MED ORDER — PROPOFOL 500 MG/50ML IV EMUL
INTRAVENOUS | Status: DC | PRN
  Administered 2017-06-21: 100 ug/kg/min via INTRAVENOUS

## 2017-06-21 MED ORDER — PROPOFOL 10 MG/ML IV BOLUS
INTRAVENOUS | Status: DC | PRN
  Administered 2017-06-21 (×2): 30 mg via INTRAVENOUS

## 2017-06-21 MED ORDER — PROPOFOL 10 MG/ML IV BOLUS
INTRAVENOUS | Status: AC
  Filled 2017-06-21: qty 20

## 2017-06-21 NOTE — Discharge Instructions (Signed)
YOU HAD AN ENDOSCOPIC PROCEDURE TODAY: Refer to the procedure report and other information in the discharge instructions given to you for any specific questions about what was found during the examination. If this information does not answer your questions, please call [facility] office at [phone] to clarify.  ° °YOU SHOULD EXPECT: Some feelings of bloating in the abdomen. Passage of more gas than usual. Walking can help get rid of the air that was put into your GI tract during the procedure and reduce the bloating. If you had a lower endoscopy (such as a colonoscopy or flexible sigmoidoscopy) you may notice spotting of blood in your stool or on the toilet paper. Some abdominal soreness may be present for a day or two, also. ° °DIET: Your first meal following the procedure should be a light meal and then it is ok to progress to your normal diet. A half-sandwich or bowl of soup is an example of a good first meal. Heavy or fried foods are harder to digest and may make you feel nauseous or bloated. Drink plenty of fluids but you should avoid alcoholic beverages for 24 hours. If you had a esophageal dilation, please see attached instructions for diet.  ° °ACTIVITY: Your care partner should take you home directly after the procedure. You should plan to take it easy, moving slowly for the rest of the day. You can resume normal activity the day after the procedure however YOU SHOULD NOT DRIVE, use power tools, machinery or perform tasks that involve climbing or major physical exertion for 24 hours (because of the sedation medicines used during the test).  ° °SYMPTOMS TO REPORT IMMEDIATELY: °A gastroenterologist can be reached at any hour. Please call [phone]  for any of the following symptoms:  °Following lower endoscopy (colonoscopy, flexible sigmoidoscopy) °Excessive amounts of blood in the stool  °Significant tenderness, worsening of abdominal pains  °Swelling of the abdomen that is new, acute  °Fever of 100° or  higher  ° ° °FOLLOW UP:  °If any biopsies were taken you will be contacted by phone or by letter within the next 1-3 weeks. Call [phone]  if you have not heard about the biopsies in 3 weeks.  °Please also call with any specific questions about appointments or follow up tests. ° °

## 2017-06-21 NOTE — Anesthesia Preprocedure Evaluation (Signed)
Anesthesia Evaluation  Patient identified by MRN, date of birth, ID band Patient awake    Reviewed: Allergy & Precautions, NPO status , Patient's Chart, lab work & pertinent test results  Airway Mallampati: II  TM Distance: >3 FB Neck ROM: Full    Dental   Pulmonary    Pulmonary exam normal        Cardiovascular hypertension, Pt. on medications Normal cardiovascular exam     Neuro/Psych    GI/Hepatic   Endo/Other  diabetes, Type 2, Insulin DependentMorbid obesity  Renal/GU      Musculoskeletal   Abdominal   Peds  Hematology   Anesthesia Other Findings   Reproductive/Obstetrics                             Anesthesia Physical Anesthesia Plan  ASA: III  Anesthesia Plan: MAC   Post-op Pain Management:    Induction: Intravenous  PONV Risk Score and Plan: 2 and Treatment may vary due to age or medical condition  Airway Management Planned: Simple Face Mask  Additional Equipment:   Intra-op Plan:   Post-operative Plan:   Informed Consent: I have reviewed the patients History and Physical, chart, labs and discussed the procedure including the risks, benefits and alternatives for the proposed anesthesia with the patient or authorized representative who has indicated his/her understanding and acceptance.     Plan Discussed with: CRNA and Surgeon  Anesthesia Plan Comments:         Anesthesia Quick Evaluation

## 2017-06-21 NOTE — Interval H&P Note (Signed)
History and Physical Interval Note: 51/African American female for initial screening colonoscopy.  06/21/2017 8:27 AM  Kaitlin [name]  has presented today for Colonoscopy, with the diagnosis of Screen for colon cancer  The various methods of treatment have been discussed with the patient and family. After consideration of risks, benefits and other options for treatment, the patient has consented to  Procedure(s): COLONOSCOPY (N/A) as a surgical intervention .  The patient's history has been reviewed, patient examined, no change in status, stable for surgery.  I have reviewed the patient's chart and labs.  Questions were answered to the patient's satisfaction.     Ronnette Juniper

## 2017-06-21 NOTE — Brief Op Note (Signed)
06/21/2017  9:27 AM  PATIENT:  Kaitlin [name]  52 y.o. female  PRE-OPERATIVE DIAGNOSIS:  Screen for colon cancer  POST-OPERATIVE DIAGNOSIS:  ascending , cecal, 2 hepatic flexure polyps  PROCEDURE:  Procedure(s): COLONOSCOPY (N/A) POLYPECTOMY  SURGEON:  Surgeon(s) and Role:    Ronnette Juniper, MD - Primary  PHYSICIAN ASSISTANT:   ASSISTANTS: Tory Emerald, RN, Laurena Spies, Tech   ANESTHESIA:   MAC  EBL:  Minimal  BLOOD ADMINISTERED:none  DRAINS: none   LOCAL MEDICATIONS USED:  NONE  SPECIMEN:  Biopsy / Limited Resection  DISPOSITION OF SPECIMEN:  PATHOLOGY  COUNTS:  YES  TOURNIQUET:  * No tourniquets in log *  DICTATION: .Dragon Dictation  PLAN OF CARE: Discharge to home after PACU  PATIENT DISPOSITION:  PACU - hemodynamically stable.   Delay start of Pharmacological VTE agent (>24hrs) due to surgical blood loss or risk of bleeding: no

## 2017-06-21 NOTE — Anesthesia Postprocedure Evaluation (Signed)
Anesthesia Post Note  Patient: Kaitlin [name]  Procedure(s) Performed: COLONOSCOPY (N/A ) POLYPECTOMY     Patient location during evaluation: PACU Anesthesia Type: MAC Level of consciousness: awake and alert Pain management: pain level controlled Vital Signs Assessment: post-procedure vital signs reviewed and stable Respiratory status: spontaneous breathing, nonlabored ventilation, respiratory function stable and patient connected to nasal cannula oxygen Cardiovascular status: stable and blood pressure returned to baseline Postop Assessment: no apparent nausea or vomiting Anesthetic complications: no    Last Vitals:  Vitals:   06/21/17 0930 06/21/17 0940  BP: (!) 139/53 112/70  Pulse: 90 85  Resp: (!) 26 (!) 28  Temp: 36.5 C   SpO2: 96% 98%    Last Pain:  Vitals:   06/21/17 0940  TempSrc:   PainSc: 0-No pain                 Eleonore Shippee DAVID

## 2017-06-21 NOTE — Op Note (Signed)
[facility] Patient Name: Kaitlin [name] Procedure Date: 06/21/2017 MRN: [id number] Attending MD: Ronnette Juniper , MD Date of Birth: Apr 14, 1965 CSN: [id number] Age: 52 Admit Type: Outpatient Procedure:                Colonoscopy Indications:              Screening for colorectal malignant neoplasm, This                            is the patient's first colonoscopy Providers:                Ronnette Juniper, MD, Elmer Ramp. Tilden Dome, RN, Laurena Spies, Technician, [name] Alday CRNA, CRNA Referring MD:              Medicines:                Monitored Anesthesia Care Complications:            No immediate complications. Estimated Blood Loss:     Estimated blood loss was minimal. Procedure:                Pre-Anesthesia Assessment:                           - Prior to the procedure, a History and Physical                            was performed, and patient medications and                            allergies were reviewed. The patient's tolerance of                            previous anesthesia was also reviewed. The risks                            and benefits of the procedure and the sedation                            options and risks were discussed with the patient.                            All questions were answered, and informed consent                            was obtained. Prior Anticoagulants: The patient has                            taken no previous anticoagulant or antiplatelet                            agents. ASA Grade Assessment: III - A patient with  severe systemic disease. After reviewing the risks                            and benefits, the patient was deemed in                            satisfactory condition to undergo the procedure.                           After obtaining informed consent, the colonoscope                            was passed under direct vision. Throughout the                procedure, the patient's blood pressure, pulse, and                            oxygen saturations were monitored continuously. The                            EC-[id number] ([id number]) scope was introduced through                            the anus and advanced to the the terminal ileum.                            The colonoscopy was performed without difficulty.                            The patient tolerated the procedure well. The                            quality of the bowel preparation was good. Scope In: 9:07:28 AM Scope Out: 9:23:23 AM Scope Withdrawal Time: 0 hours 11 minutes 56 seconds  Total Procedure Duration: 0 hours 15 minutes 55 seconds  Findings:      The perianal and digital rectal examinations were normal.      Four sessile polyps were found in the hepatic flexure, ascending colon       and cecum. The polyps were 4 to 6 mm in size. These polyps were removed       with a piecemeal technique using a cold biopsy forceps. Resection and       retrieval were complete.      The terminal ileum appeared normal.      The exam was otherwise without abnormality on direct and retroflexion       views. Impression:               - Four 4 to 6 mm polyps at the hepatic flexure, in                            the ascending colon and in the cecum, removed                            piecemeal using a cold biopsy forceps. Resected and  retrieved.                           - The examined portion of the ileum was normal.                           - The examination was otherwise normal on direct                            and retroflexion views. Moderate Sedation:      Patient did not receive moderate sedation for this procedure, but       instead received monitored anesthesia care. Recommendation:           - Patient has a contact number available for                            emergencies. The signs and symptoms of potential                             delayed complications were discussed with the                            patient. Return to normal activities tomorrow.                            Written discharge instructions were provided to the                            patient.                           - Resume regular diet.                           - Continue present medications.                           - Await pathology results.                           - Repeat colonoscopy for surveillance based on                            pathology results. Procedure Code(s):        --- Professional ---                           [phone], Colonoscopy, flexible; with biopsy, single                            or multiple Diagnosis Code(s):        --- Professional ---                           Z12.11, Encounter for screening for malignant  neoplasm of colon                           D12.3, Benign neoplasm of transverse colon (hepatic                            flexure or splenic flexure)                           D12.2, Benign neoplasm of ascending colon                           D12.0, Benign neoplasm of cecum CPT copyright 2017 [employer]. All rights reserved. The codes documented in this report are preliminary and upon coder review may  be revised to meet current compliance requirements. Ronnette Juniper, MD 06/21/2017 9:27:25 AM This report has been signed electronically. Number of Addenda: 0

## 2017-06-21 NOTE — Transfer of Care (Signed)
Immediate Anesthesia Transfer of Care Note  Patient: Kaitlin [name]  Procedure(s) Performed: COLONOSCOPY (N/A ) POLYPECTOMY  Patient Location: PACU  Anesthesia Type:MAC  Level of Consciousness: sedated  Airway & Oxygen Therapy: Patient Spontanous Breathing and Patient connected to nasal cannula oxygen  Post-op Assessment: Report given to RN and Post -op Vital signs reviewed and stable  Post vital signs: Reviewed and stable  Last Vitals:  Vitals Value Taken Time  BP    Temp    Pulse 90 06/21/2017  9:27 AM  Resp 16 06/21/2017  9:27 AM  SpO2 98 % 06/21/2017  9:27 AM  Vitals shown include unvalidated device data.  Last Pain:  Vitals:   06/21/17 0819  TempSrc: Oral  PainSc: 0-No pain         Complications: No apparent anesthesia complications

## 2017-06-23 ENCOUNTER — Other Ambulatory Visit: Payer: BLUE CROSS/BLUE SHIELD | Admitting: Orthotics

## 2017-06-24 ENCOUNTER — Encounter (HOSPITAL_COMMUNITY): Payer: Self-pay | Admitting: Gastroenterology

## 2017-06-27 ENCOUNTER — Ambulatory Visit: Payer: BLUE CROSS/BLUE SHIELD | Admitting: Orthotics

## 2017-06-27 ENCOUNTER — Telehealth: Payer: Self-pay | Admitting: Podiatry

## 2017-06-27 DIAGNOSIS — M722 Plantar fascial fibromatosis: Secondary | ICD-10-CM

## 2017-06-27 DIAGNOSIS — E1142 Type 2 diabetes mellitus with diabetic polyneuropathy: Secondary | ICD-10-CM

## 2017-06-27 DIAGNOSIS — L84 Corns and callosities: Secondary | ICD-10-CM

## 2017-06-27 DIAGNOSIS — M2042 Other hammer toe(s) (acquired), left foot: Secondary | ICD-10-CM

## 2017-06-27 DIAGNOSIS — M2041 Other hammer toe(s) (acquired), right foot: Secondary | ICD-10-CM

## 2017-06-27 NOTE — Progress Notes (Signed)
Patient seen today per Dr. Elisha Ponder for DB shoes and inserts; while discussing objective and goals , the patient was concerned as well as plantar fasciitis pain history.  Dr. Elisha Ponder doesn't mention that in her notes.  Dawn will discuss w/ Dr Elisha Ponder this new development as [phone] is covered by insurance while [phone] and [phone] is not.  My perspective is to fab [id number] with p-cell cover to protect diabetic foot as well as address PF; comp shoes.

## 2017-06-27 NOTE — Telephone Encounter (Signed)
Left message for pt with coverage information for orthotics and asked to call to confirm.Marland KitchenMarland Kitchen

## 2017-07-19 ENCOUNTER — Ambulatory Visit: Payer: BLUE CROSS/BLUE SHIELD | Admitting: Orthotics

## 2017-07-19 DIAGNOSIS — E1142 Type 2 diabetes mellitus with diabetic polyneuropathy: Secondary | ICD-10-CM

## 2017-07-19 DIAGNOSIS — L84 Corns and callosities: Secondary | ICD-10-CM

## 2017-07-19 DIAGNOSIS — M722 Plantar fascial fibromatosis: Secondary | ICD-10-CM

## 2017-07-19 NOTE — Progress Notes (Signed)
Patient came in today to pick up custom made foot orthotics.  The goals were accomplished and the patient reported no dissatisfaction with said orthotics.  Patient was advised of breakin period and how to report any issues. 

## 2017-08-30 ENCOUNTER — Other Ambulatory Visit (HOSPITAL_COMMUNITY): Payer: Self-pay | Admitting: Family Medicine

## 2017-08-30 ENCOUNTER — Ambulatory Visit (HOSPITAL_COMMUNITY)
Admission: RE | Admit: 2017-08-30 | Discharge: 2017-08-30 | Disposition: A | Discharge status: Home / Self Care | Payer: BLUE CROSS/BLUE SHIELD | Source: Ambulatory Visit | Attending: Vascular Surgery | Admitting: Vascular Surgery

## 2017-08-30 DIAGNOSIS — M79604 Pain in right leg: Secondary | ICD-10-CM | POA: Insufficient documentation

## 2017-08-30 DIAGNOSIS — R609 Edema, unspecified: Secondary | ICD-10-CM | POA: Diagnosis present

## 2017-09-16 ENCOUNTER — Ambulatory Visit: Payer: BLUE CROSS/BLUE SHIELD | Admitting: Podiatry

## 2017-10-28 ENCOUNTER — Encounter: Payer: Self-pay | Admitting: Podiatry

## 2017-10-28 ENCOUNTER — Ambulatory Visit (INDEPENDENT_AMBULATORY_CARE_PROVIDER_SITE_OTHER): Payer: BLUE CROSS/BLUE SHIELD | Admitting: Podiatry

## 2017-10-28 DIAGNOSIS — E1142 Type 2 diabetes mellitus with diabetic polyneuropathy: Secondary | ICD-10-CM

## 2017-10-28 DIAGNOSIS — M79674 Pain in right toe(s): Secondary | ICD-10-CM | POA: Diagnosis not present

## 2017-10-28 DIAGNOSIS — B351 Tinea unguium: Secondary | ICD-10-CM | POA: Diagnosis not present

## 2017-10-28 DIAGNOSIS — L84 Corns and callosities: Secondary | ICD-10-CM | POA: Diagnosis not present

## 2017-10-28 DIAGNOSIS — M79675 Pain in left toe(s): Secondary | ICD-10-CM | POA: Diagnosis not present

## 2017-10-30 ENCOUNTER — Encounter: Payer: Self-pay | Admitting: Podiatry

## 2017-10-30 NOTE — Progress Notes (Signed)
Subjective: Kaitlin [name] presents to clinic today for preventative foot care. She has diabetic neuropathy, painful, discolored, thick toenails and painful callus/corn which interfere with activities of daily living.   She states she had an injury to her RLE and strained her thigh, knee and calf. She also states her plantar fasciitis has flared up as well. She states her pain is 5/10 on today. She is currently in physical therapy for her injury.   Objective: Neurovascular status unchanged b/l LE  Dermatological Examination: Skin with normal turgor, texture and tone b/l Toenails 1-5 b/l discolored, thick, dystrophic with subungual debris and pain with palpation to nailbeds due to thickness of nails. Hyperkeratotic lesion submetatarsal head 1 b/l LE and dorsal 5th digit PIPJ b/l feet  Musculoskeletal: Muscle strength 5/5 to all LE muscle groups Slight pain on palpation right medial tubercle of calcaneus  Neurological: Sensation diminished with 10 gram monofilament. Vibratory sensation diminished  Assessment: 1. Painful onychomycosis toenails 1-5 b/l 2. Callus submetatarsal head 1 b/l 3. Corn 5th digit PIPJ 4. NIDDM with Diabetic neuropathy  Plan: 1. Discussed her plantar fasciitis. She may continue use of her boot if she is still tender at times. Her pain today does not warrant steroid injection. Continue physical therapy for plantar fasciitis. 2. Continue diabetic foot care principles.  3. Toenails 1-5 b/l were debrided in length and girth without iatrogenic bleeding. 4. Hyperkeratotic lesions pared submetatarsal head 1 b/l, dorsal 5th PIPJ b/l 5. Patient to continue diabetic shoe gear 6. Patient to report any pedal injuries to medical professional  7. Follow up 3 months. Patient/POA to call should there be a concern in the interim.

## 2017-11-20 ENCOUNTER — Other Ambulatory Visit: Payer: Self-pay | Admitting: Cardiology

## 2017-11-21 NOTE — Telephone Encounter (Signed)
Rx has been sent to the pharmacy electronically. ° °

## 2017-11-24 ENCOUNTER — Other Ambulatory Visit: Payer: Self-pay | Admitting: Family Medicine

## 2017-11-24 DIAGNOSIS — Z1231 Encounter for screening mammogram for malignant neoplasm of breast: Secondary | ICD-10-CM

## 2017-11-29 ENCOUNTER — Inpatient Hospital Stay: Payer: BLUE CROSS/BLUE SHIELD | Attending: Oncology

## 2017-11-29 ENCOUNTER — Inpatient Hospital Stay: Payer: BLUE CROSS/BLUE SHIELD | Admitting: Oncology

## 2017-12-01 ENCOUNTER — Telehealth: Payer: Self-pay | Admitting: Oncology

## 2017-12-01 NOTE — Telephone Encounter (Signed)
Tried to call regarding voicemail I did leave a message

## 2017-12-02 ENCOUNTER — Telehealth: Payer: Self-pay | Admitting: Oncology

## 2017-12-02 NOTE — Telephone Encounter (Signed)
Tried to reach regarding voicemail °

## 2017-12-05 ENCOUNTER — Telehealth: Payer: Self-pay | Admitting: Oncology

## 2017-12-05 NOTE — Telephone Encounter (Signed)
Tried to reach regarding voicemail I left a message

## 2017-12-21 ENCOUNTER — Encounter: Payer: Self-pay | Admitting: Cardiology

## 2018-01-03 ENCOUNTER — Ambulatory Visit
Admission: RE | Admit: 2018-01-03 | Discharge: 2018-01-03 | Disposition: A | Discharge status: Home / Self Care | Payer: BLUE CROSS/BLUE SHIELD | Source: Ambulatory Visit | Attending: Family Medicine | Admitting: Family Medicine

## 2018-01-03 DIAGNOSIS — Z1231 Encounter for screening mammogram for malignant neoplasm of breast: Secondary | ICD-10-CM

## 2018-01-12 DIAGNOSIS — G629 Polyneuropathy, unspecified: Secondary | ICD-10-CM

## 2018-01-12 DIAGNOSIS — H269 Unspecified cataract: Secondary | ICD-10-CM

## 2018-01-12 HISTORY — DX: Polyneuropathy, unspecified: G62.9

## 2018-01-12 HISTORY — DX: Unspecified cataract: H26.9

## 2018-01-13 ENCOUNTER — Inpatient Hospital Stay: Payer: BLUE CROSS/BLUE SHIELD | Admitting: Oncology

## 2018-01-13 ENCOUNTER — Telehealth: Payer: Self-pay | Admitting: *Deleted

## 2018-01-13 ENCOUNTER — Inpatient Hospital Stay: Payer: BLUE CROSS/BLUE SHIELD

## 2018-01-13 NOTE — Telephone Encounter (Signed)
Patient calling to cancel appt with dr Alen Blew today. States he insurance e-mailed her, and she is no longer covered, but they are working on this issue. She requested to be re-scheduled at the end of this month. [place] to scheduler.

## 2018-01-16 ENCOUNTER — Ambulatory Visit: Payer: BLUE CROSS/BLUE SHIELD | Admitting: Cardiology

## 2018-01-17 ENCOUNTER — Telehealth: Payer: Self-pay | Admitting: Oncology

## 2018-01-17 NOTE — Telephone Encounter (Signed)
Called to confirm appt per 1/3 sch message- unable to reach patient - left message with appt date and time

## 2018-01-27 ENCOUNTER — Ambulatory Visit: Payer: BLUE CROSS/BLUE SHIELD | Admitting: Podiatry

## 2018-02-15 ENCOUNTER — Inpatient Hospital Stay: Payer: Self-pay | Admitting: Oncology

## 2018-02-15 ENCOUNTER — Inpatient Hospital Stay: Payer: Self-pay

## 2018-02-21 ENCOUNTER — Ambulatory Visit: Payer: BLUE CROSS/BLUE SHIELD | Admitting: Cardiology

## 2018-02-24 ENCOUNTER — Ambulatory Visit: Payer: Medicaid Other | Admitting: Podiatry

## 2018-02-28 DIAGNOSIS — M79676 Pain in unspecified toe(s): Secondary | ICD-10-CM

## 2018-03-09 ENCOUNTER — Telehealth: Payer: Self-pay | Admitting: Oncology

## 2018-03-09 NOTE — Telephone Encounter (Signed)
R/s appt per 2/27 sch message - left message for patient with appt date and time

## 2018-03-17 ENCOUNTER — Other Ambulatory Visit: Payer: Self-pay | Admitting: Cardiology

## 2018-03-24 ENCOUNTER — Telehealth: Payer: Self-pay | Admitting: *Deleted

## 2018-03-24 NOTE — Telephone Encounter (Signed)
Pt left a message stating Dr Alen Blew is no longer in her network. Needs to cancel appt

## 2018-03-28 ENCOUNTER — Ambulatory Visit: Payer: Medicaid Other | Admitting: Podiatry

## 2018-04-13 ENCOUNTER — Other Ambulatory Visit: Payer: Self-pay

## 2018-04-13 ENCOUNTER — Ambulatory Visit: Payer: Self-pay | Admitting: Oncology

## 2018-04-25 ENCOUNTER — Ambulatory Visit: Payer: Self-pay | Admitting: Cardiology

## 2018-05-12 ENCOUNTER — Telehealth: Payer: Self-pay | Admitting: Cardiology

## 2018-05-12 NOTE — Telephone Encounter (Signed)
lmsg for patient.  Her April appt was cancelled.  Please reschedule if patient calls/dc

## 2018-06-14 ENCOUNTER — Other Ambulatory Visit: Payer: Self-pay | Admitting: Cardiology

## 2018-07-11 ENCOUNTER — Other Ambulatory Visit: Payer: Self-pay | Admitting: Cardiology

## 2018-09-25 ENCOUNTER — Other Ambulatory Visit: Payer: Self-pay

## 2018-11-19 ENCOUNTER — Encounter (HOSPITAL_BASED_OUTPATIENT_CLINIC_OR_DEPARTMENT_OTHER): Payer: Self-pay

## 2018-11-19 ENCOUNTER — Emergency Department (HOSPITAL_BASED_OUTPATIENT_CLINIC_OR_DEPARTMENT_OTHER): Payer: Medicaid Other

## 2018-11-19 ENCOUNTER — Emergency Department (HOSPITAL_BASED_OUTPATIENT_CLINIC_OR_DEPARTMENT_OTHER)
Admission: EM | Admit: 2018-11-19 | Discharge: 2018-11-19 | Disposition: A | Discharge status: Home / Self Care | Payer: Medicaid Other | Attending: Emergency Medicine | Admitting: Emergency Medicine

## 2018-11-19 ENCOUNTER — Other Ambulatory Visit: Payer: Self-pay

## 2018-11-19 DIAGNOSIS — R52 Pain, unspecified: Secondary | ICD-10-CM

## 2018-11-19 DIAGNOSIS — R0600 Dyspnea, unspecified: Secondary | ICD-10-CM | POA: Insufficient documentation

## 2018-11-19 DIAGNOSIS — B349 Viral infection, unspecified: Secondary | ICD-10-CM | POA: Insufficient documentation

## 2018-11-19 DIAGNOSIS — E119 Type 2 diabetes mellitus without complications: Secondary | ICD-10-CM | POA: Diagnosis not present

## 2018-11-19 DIAGNOSIS — R5383 Other fatigue: Secondary | ICD-10-CM | POA: Diagnosis present

## 2018-11-19 DIAGNOSIS — Z20828 Contact with and (suspected) exposure to other viral communicable diseases: Secondary | ICD-10-CM | POA: Diagnosis not present

## 2018-11-19 DIAGNOSIS — Z79899 Other long term (current) drug therapy: Secondary | ICD-10-CM | POA: Insufficient documentation

## 2018-11-19 DIAGNOSIS — I10 Essential (primary) hypertension: Secondary | ICD-10-CM | POA: Diagnosis not present

## 2018-11-19 DIAGNOSIS — Z7722 Contact with and (suspected) exposure to environmental tobacco smoke (acute) (chronic): Secondary | ICD-10-CM | POA: Insufficient documentation

## 2018-11-19 LAB — CBC WITH DIFFERENTIAL/PLATELET
Abs Immature Granulocytes: 0.04 10*3/uL (ref 0.00–0.07)
Basophils Absolute: 0 10*3/uL (ref 0.0–0.1)
Basophils Relative: 0 %
Eosinophils Absolute: 0 10*3/uL (ref 0.0–0.5)
Eosinophils Relative: 0 %
HCT: 41.3 % (ref 36.0–46.0)
Hemoglobin: 13.4 g/dL (ref 12.0–15.0)
Immature Granulocytes: 0 %
Lymphocytes Relative: 33 %
Lymphs Abs: 3.3 10*3/uL (ref 0.7–4.0)
MCH: 24.8 pg — ABNORMAL LOW (ref 26.0–34.0)
MCHC: 32.4 g/dL (ref 30.0–36.0)
MCV: 76.5 fL — ABNORMAL LOW (ref 80.0–100.0)
Monocytes Absolute: 0.5 10*3/uL (ref 0.1–1.0)
Monocytes Relative: 5 %
Neutro Abs: 6 10*3/uL (ref 1.7–7.7)
Neutrophils Relative %: 62 %
Platelets: 263 10*3/uL (ref 150–400)
RBC: 5.4 MIL/uL — ABNORMAL HIGH (ref 3.87–5.11)
RDW: 17.2 % — ABNORMAL HIGH (ref 11.5–15.5)
WBC: 9.8 10*3/uL (ref 4.0–10.5)
nRBC: 0 % (ref 0.0–0.2)

## 2018-11-19 LAB — BASIC METABOLIC PANEL
Anion gap: 8 (ref 5–15)
BUN: 11 mg/dL (ref 6–20)
CO2: 28 mmol/L (ref 22–32)
Calcium: 9.4 mg/dL (ref 8.9–10.3)
Chloride: 106 mmol/L (ref 98–111)
Creatinine, Ser: 0.73 mg/dL (ref 0.44–1.00)
GFR calc Af Amer: >60 mL/min (ref >60)
GFR calc non Af Amer: >60 mL/min (ref >60)
Glucose, Bld: 141 mg/dL — ABNORMAL HIGH (ref 70–99)
Potassium: 3.8 mmol/L (ref 3.5–5.1)
Sodium: 142 mmol/L (ref 135–145)

## 2018-11-19 LAB — BRAIN NATRIURETIC PEPTIDE: B Natriuretic Peptide: 48.3 pg/mL (ref 0.0–100.0)

## 2018-11-19 LAB — INFLUENZA PANEL BY PCR (TYPE A & B)
Influenza A By PCR: NEGATIVE
Influenza B By PCR: NEGATIVE

## 2018-11-19 LAB — TROPONIN I (HIGH SENSITIVITY): Troponin I (High Sensitivity): 9 ng/L (ref <18)

## 2018-11-19 MED ORDER — BENZONATATE 200 MG PO CAPS: 200.0000 mg | ORAL_CAPSULE | Freq: Three times a day (TID) | ORAL | 0 refills | Status: DC

## 2018-11-19 NOTE — ED Provider Notes (Signed)
[facility] EMERGENCY DEPARTMENT Provider Note   CSN: [id number] Arrival date & time: 11/19/18  1324     History   Chief Complaint Chief Complaint  Patient presents with  . Viral Like Illness    HPI Kaitlin [name] is a 53 y.o. female     HPI  Patient presents today for fatigue, body aches, headache, sinus drainage, shortness of breath, cough with sputum production, and subjective fevers.  She states that symptoms began several days ago and have been constant since.  She states that she is concerned she has influenza or coronavirus.  Patient does endorse shortness of breath that is not worse with exertion but states that she has felt short of breath at night which is required her to use a pillow to prop herself up.  She attributes this to her congestion.  States she also has some increased swelling in her legs bilaterally.  Denies any hemoptysis, history of clots, recent surgery or hypercoagulable state, and her last DVT study was in 2019 which was negative she states this was not because of her leg swelling.  States she sees a cardiologist regularly for her heart failure and states that he feels her symptoms are well controlled w/  Lasix.  Patient also endorses some left shoulder pain and states that it is worse when she is using her arm, is not worse with walking or walking up stairs, is not associated with chest pain denies any associated shortness of breath.  Past Medical History:  Diagnosis Date  . Anemia   . Bronchitis   . Diabetes mellitus   . High cholesterol   . Hypertension    takes medicine to protect kidneys, does not have HTN  . Leukocytosis   . Obesity   . Sickle cell trait ([place])   . Thymoma     Patient Active Problem List   Diagnosis Date Noted  . 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 Dyspnea on exertion 05/19/2016  . Other fatigue 05/19/2016  . Absolute anemia 04/15/2016  . Left leg weakness 10/18/2014  . Left leg paresthesias 10/18/2014  . Acute  left-sided weakness 10/18/2014  . Encounter for fitting or adjustment of insulin pump 03/08/2014  . Taking drug for chronic disease 03/08/2014  . H/O insertion of insulin pump 02/18/2014  . Long term current use of insulin ([place]) 02/18/2014  . Insulin pump in place 02/18/2014  . 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 Abnormal ballistocardiogram 05/18/2013  . Abnormal nuclear stress test 05/18/2013  . 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 NICM (nonischemic cardiomyopathy) ([place]) 05/01/2013  . Insulin dependent diabetes mellitus ([place]) 04/10/2013  . Sex counseling 04/10/2013  . Edema leg 04/10/2013  . Upper respiratory infection, viral 04/10/2013  . Pedal edema 03/29/2013  . Pneumonia 12/12/2012  . Essential hypertension 12/12/2012  . HLD (hyperlipidemia) 12/12/2012  . Tonsillar mass 12/01/2012  . 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 Leukocytosis   . Anemia   . High cholesterol   . Hypertension   . Blurred vision 09/11/2011  . Foot pain 09/11/2011  . Breast pain 09/11/2011  . Morbid obesity ([place]) 09/11/2011  . Fungal infection of nail 09/11/2011  . Avitaminosis D 09/11/2011    Past Surgical History:  Procedure Laterality Date  . COLONOSCOPY N/A 06/21/2017   Procedure: COLONOSCOPY;  Surgeon: Ronnette Juniper, MD;  Location: [facility];  Service: Gastroenterology;  Laterality: N/A;  . LEFT HEART CATHETERIZATION WITH CORONARY ANGIOGRAM N/A 05/25/2013   Procedure: LEFT HEART  CATHETERIZATION WITH CORONARY ANGIOGRAM;  Surgeon: Troy Sine, MD;  Location: [facility] CATH LAB;  Service: Cardiovascular;  Laterality: N/A;  . LYMPH NODE BIOPSY Right 11/30/2012   Procedure: RIGHT TONSIL BIOPSY WITH FRESH FROZEN ANALYSIS;  Surgeon: Jodi Marble, MD;  Location: [place] [facility];  Service: ENT;  Laterality: Right;  . MEDIASTERNOTOMY N/A 01/12/2013   Procedure: MEDIAN STERNOTOMY;  Surgeon: Gaye Pollack, MD;  Location: [facility];  Service: Thoracic;  Laterality: N/A;  . MEDIASTINOTOMY  CHAMBERLAIN MCNEIL Right 11/06/2012   Procedure: MEDIASTINOTOMY CHAMBERLAIN MCNEILPROCEDURE;  Surgeon: Gaye Pollack, MD;  Location: [facility] [facility];  Service: Thoracic;  Laterality: Right;  . POLYPECTOMY  06/21/2017   Procedure: POLYPECTOMY;  Surgeon: Ronnette Juniper, MD;  Location: [facility];  Service: Gastroenterology;;  . RESECTION OF A THYMOMA N/A 01/12/2013   Procedure: RESECTION OF A THYMOMA;  Surgeon: Gaye Pollack, MD;  Location: [facility];  Service: Thoracic;  Laterality: N/A;  . TONSILLECTOMY    . TUBAL LIGATION       OB History   No obstetric history on file.      Home Medications    Prior to Admission medications   Medication Sig Start Date End Date Taking? Authorizing Provider  acetaminophen (TYLENOL) 500 MG tablet Take 1,000 mg by mouth every 6 (six) hours as needed for moderate pain or headache.    [provider]  albuterol (PROVENTIL HFA;VENTOLIN HFA) 108 (90 Base) MCG/ACT inhaler Inhale 1-2 puffs into the lungs every 6 (six) hours as needed for wheezing or shortness of breath. Patient taking differently: Inhale 2 puffs into the lungs every 6 (six) hours as needed for wheezing or shortness of breath.  02/10/15   Drenda Freeze, MD  aspirin EC 81 MG tablet Take 1 tablet (81 mg total) by mouth daily. 05/18/16   Minus Breeding, MD  azithromycin (ZITHROMAX) 250 MG tablet  06/02/17   [provider]  benzonatate (TESSALON) 200 MG capsule Take 1 capsule (200 mg total) by mouth 3 (three) times daily. 11/19/18   Tedd Sias, PA  Calcium-Phosphorus-Vitamin D (CALCIUM GUMMIES PO) Take 2 tablets by mouth daily.    [provider]  carvedilol (COREG) 25 MG tablet TAKE 1 TABLET BY MOUTH TWICE DAILY WITH A MEAL 07/11/18   Minus Breeding, MD  Cholecalciferol (VITAMIN D-3) 5000 units TABS Take 5,000 Units by mouth daily.    [provider]  ciclopirox (PENLAC) 8 % solution Apply topically at bedtime. Apply over nail and surrounding skin. Apply daily over  previous coat. Remove weekly with polish remover. 06/17/17   Marzetta Board, DPM  ELDERBERRY PO Take 1 tablet by mouth 2 (two) times daily.    [provider]  fexofenadine (ALLEGRA) 180 MG tablet Take 180 mg by mouth daily.    [provider]  furosemide (LASIX) 40 MG tablet Take 1 tablet (40 mg total) by mouth daily. Patient taking differently: Take 40 mg by mouth 2 (two) times daily.  06/14/16   Minus Breeding, MD  GAVILYTE-G 236 g solution  06/16/17   [provider]  glucose blood (BAYER CONTOUR NEXT TEST) test strip as directed. 05/11/16   [provider]  insulin aspart (NOVOLOG) 100 UNIT/ML injection Use approximately 200 units via insulin pump  DX E11.9   And Z96.41 03/29/16   [provider]  lidocaine (LIDODERM) 5 % Place 3 patches onto the skin daily. Remove & Discard patch within 12 hours or as directed by MD 11/18/14  Melvenia Beam, MD  lisinopril (PRINIVIL,ZESTRIL) 10 MG tablet Take 1 tablet (10 mg total) by mouth daily. PLEASE CONTACT OFFICE FOR ADDITIONAL REFILLS 12/19/15   Minus Breeding, MD  metFORMIN (GLUCOPHAGE) 1000 MG tablet Take 1,000 mg by mouth 2 (two) times daily with a meal.    [provider]  Multiple Vitamin (MULTIVITAMIN WITH MINERALS) TABS Take 1 tablet by mouth every morning.     [provider]  Multiple Vitamins-Minerals (EMERGEN-C IMMUNE PO) Take 1 packet by mouth 2 (two) times daily.    [provider]  Multiple Vitamins-Minerals (HAIR SKIN AND NAILS FORMULA) TABS Take 2 tablets by mouth daily.    [provider]  Multiple Vitamins-Minerals (PRESERVISION/LUTEIN PO) Take 2 tablets by mouth 2 (two) times daily.    [provider]  Phenylephrine-APAP-Guaifenesin [facility]) [phone] MG/20ML LIQD Take 2 capsules by mouth daily. 04/16/15   Wyatt Portela, MD  potassium chloride SA (K-DUR,KLOR-CON) 20 MEQ tablet Take 1 tablet (20 mEq total) by mouth daily. 06/14/16    Minus Breeding, MD  predniSONE (DELTASONE) 10 MG tablet Take 10 mg by mouth daily with breakfast.    [provider]  rosuvastatin (CRESTOR) 20 MG tablet Take 20 mg by mouth every evening. 12/13/14 06/14/17  [provider]    Family History Family History  Problem Relation Age of Onset  . Heart disease Father        No details  . Diabetes type II Other        Family HX  . Breast cancer Other     Social History Social History   Tobacco Use  . Smoking status: Passive Smoke Exposure - Never Smoker  . Smokeless tobacco: Never Used  Substance Use Topics  . Alcohol use: No  . Drug use: No     Allergies   Amoxicillin-pot clavulanate and Ibuprofen   Review of Systems Review of Systems  Constitutional: Positive for chills and fever.  HENT: Positive for congestion and sore throat.   Gastrointestinal: Negative for abdominal pain, diarrhea and nausea.  Musculoskeletal: Positive for myalgias.       Left arm pain  All other systems reviewed and are negative.    Physical Exam Updated Vital Signs BP 132/89 (BP Location: Left Arm)   Pulse 91   Temp 99.1 F (37.3 C) (Oral)   Resp 18   Ht 5\' 9"  (1.753 m)   Wt (!) 171.9 kg   LMP 06/17/2012   SpO2 96%   BMI 55.97 kg/m   Physical Exam Vitals signs and nursing note reviewed.  Constitutional:      General: She is not in acute distress.    Appearance: She is obese.  HENT:     Head: Normocephalic and atraumatic.     Nose: Nose normal.  Eyes:     General: No scleral icterus. Neck:     Musculoskeletal: Normal range of motion.  Cardiovascular:     Rate and Rhythm: Normal rate and regular rhythm.     Pulses: Normal pulses.     Heart sounds: Normal heart sounds.  Pulmonary:     Effort: Pulmonary effort is normal. No respiratory distress.     Breath sounds: No wheezing.  Abdominal:     Palpations: Abdomen is soft.     Tenderness: There is no abdominal tenderness.  Musculoskeletal:     Right lower leg:  Edema present.     Left lower leg: Edema present.     Comments: Left shoulder  with full active and passive range of motion, active range of motion does elicit pain.  Mild tenderness to palpation over acromion and over deltoid muscle.  No trapezius tenderness to palpation.  Patient has negative empty can and Speed test.  Strength 5/5 bilateral grip, and with elbow flexion extension.  Skin:    General: Skin is warm and dry.     Capillary Refill: Capillary refill takes less than 2 seconds.  Neurological:     Mental Status: She is alert. Mental status is at baseline.  Psychiatric:        Mood and Affect: Mood normal.        Behavior: Behavior normal.      ED Treatments / Results  Labs (all labs ordered are listed, but only abnormal results are displayed) Labs Reviewed  BASIC METABOLIC PANEL - Abnormal; Notable for the following components:      Result Value   Glucose, Bld 141 (*)    All other components within normal limits  CBC WITH DIFFERENTIAL/PLATELET - Abnormal; Notable for the following components:   RBC 5.40 (*)    MCV 76.5 (*)    MCH 24.8 (*)    RDW 17.2 (*)    All other components within normal limits  SARS CORONAVIRUS 2 (TAT 6-24 HRS)  BRAIN NATRIURETIC PEPTIDE  INFLUENZA PANEL BY PCR (TYPE A & B)  TROPONIN I (HIGH SENSITIVITY)    EKG EKG Interpretation  Date/Time:  Sunday November 19 2018 13:51:16 EST Ventricular Rate:  88 PR Interval:  140 QRS Duration: 98 QT Interval:  380 QTC Calculation: 459 R Axis:   -7 Text Interpretation: Normal sinus rhythm Normal ECG Confirmed by Quintella Reichert [phone]) on 11/19/2018 4:30:43 PM   Radiology Dg Chest Portable 1 View  Result Date: 11/19/2018 CLINICAL DATA:  Cough EXAM: PORTABLE CHEST 1 VIEW COMPARISON:  06/15/2018 FINDINGS: Underpenetrated AP portable examination. Cardiomegaly status post median sternotomy. Both lungs are clear. The visualized skeletal structures are unremarkable. IMPRESSION: Cardiomegaly without acute  abnormality of the lungs in underpenetrated AP portable examination. Electronically Signed   By: Eddie Candle M.D.   On: 11/19/2018 17:38    Procedures Procedures (including critical care time)  Medications Ordered in ED Medications - No data to display   Initial Impression / Assessment and Plan / ED Course  I have reviewed the triage vital signs and the nursing notes.  Pertinent labs & imaging results that were available during my care of the patient were reviewed by me and considered in my medical decision making (see chart for details).        Patient presents with symptoms consistent with viral illness however does endorse left arm pain and shortness of breath with exertion and remote, intermittent chest pain with coughing.  Likely all the symptoms are related to her viral illness however troponin EKG and chest x-ray ordered to exclude pneumonia, or ACS.  These were within normal limits.  Discussed case with Dr. Ralene Bathe who recommended canceling second troponin  Patient to go home if she is agreeable to plan.  Blood work within normal limits BNP not elevated, coronavirus test pending, flu test pending.  Patient will be called with results of these tests.  Patient is well-appearing and comfortable with plan to discharge home with conservative management and assumption that she is positive for Covid until she finds out she is not.  Encouraged to follow-up with her cardiology Dr. Runell Gess she is agreeable to.  Patient discharged with normal vitals and is well-appearing.  Given return precautions.  Prescribed benzonatate for cough.  Given recommendations for conservative over-the-counter management of her symptoms.   Final Clinical Impressions(s) / ED Diagnoses   Final diagnoses:  Viral illness  Body aches    ED Discharge Orders         Ordered    benzonatate (TESSALON) 200 MG capsule  3 times daily     11/19/18 1947           Tedd Sias, [place] 11/20/18 0054    Quintella Reichert, MD 11/22/18 1019

## 2018-11-19 NOTE — ED Triage Notes (Signed)
Pt c/o fatigue, muscle aches, HA, sinus drainage, ShOB when coughing, productive cough. TMax at home 99.5.

## 2018-11-19 NOTE — ED Notes (Signed)
ED Provider at bedside. 

## 2018-11-19 NOTE — ED Notes (Signed)
Called for triage x3 and no patient answered at this time.

## 2018-11-19 NOTE — Discharge Instructions (Signed)
Return to ED if you have any chest pain, shortness of breath, difficulty breathing, nausea, or other new or concerning symptoms.  Your EKG and blood work looks reassuring today.  Most likely your symptoms are due to a viral illness however will like you to follow-up with your cardiologist as you have had progressive shortness of breath and requiring pillows to prop you in bed.  Viral Illness TREATMENT  Treatment is directed at relieving symptoms. There is no cure. Antibiotics are not effective, because the infection is caused by a virus, not by bacteria. Treatment may include:  Increased fluid intake. Sports drinks offer valuable electrolytes, sugars, and fluids.  Breathing heated mist or steam (vaporizer or shower).  Eating chicken soup or other clear broths, and maintaining good nutrition.  Getting plenty of rest.  Using gargles or lozenges for comfort.  Increasing usage of your inhaler if you have asthma.  Return to work when your temperature has returned to normal.  Gargle warm salt water and spit it out for sore throat. Take benadryl to decrease sinus secretions. Continue to alternate between Tylenol and ibuprofen for pain and fever control.  Follow Up: Follow up with your primary care doctor in 5-7 days for recheck of ongoing symptoms.  Return to emergency department for emergent changing or worsening of symptoms.

## 2018-11-20 LAB — SARS CORONAVIRUS 2 (TAT 6-24 HRS): SARS Coronavirus 2: NEGATIVE

## 2018-12-13 ENCOUNTER — Other Ambulatory Visit: Payer: Self-pay | Admitting: Physician Assistant

## 2018-12-13 ENCOUNTER — Other Ambulatory Visit: Payer: Self-pay | Admitting: Family Medicine

## 2018-12-13 DIAGNOSIS — Z1231 Encounter for screening mammogram for malignant neoplasm of breast: Secondary | ICD-10-CM

## 2019-01-12 ENCOUNTER — Inpatient Hospital Stay (HOSPITAL_BASED_OUTPATIENT_CLINIC_OR_DEPARTMENT_OTHER)
Admission: EM | Admit: 2019-01-12 | Discharge: 2019-02-09 | DRG: 207 | Disposition: A | Discharge status: Rehabilitation Facility | Payer: Medicaid Other | Attending: Internal Medicine | Admitting: Internal Medicine

## 2019-01-12 ENCOUNTER — Encounter (HOSPITAL_BASED_OUTPATIENT_CLINIC_OR_DEPARTMENT_OTHER): Payer: Self-pay | Admitting: *Deleted

## 2019-01-12 ENCOUNTER — Emergency Department (HOSPITAL_BASED_OUTPATIENT_CLINIC_OR_DEPARTMENT_OTHER): Payer: Medicaid Other

## 2019-01-12 ENCOUNTER — Other Ambulatory Visit: Payer: Self-pay

## 2019-01-12 DIAGNOSIS — R7401 Elevation of levels of liver transaminase levels: Secondary | ICD-10-CM | POA: Diagnosis present

## 2019-01-12 DIAGNOSIS — G9340 Encephalopathy, unspecified: Secondary | ICD-10-CM | POA: Diagnosis not present

## 2019-01-12 DIAGNOSIS — Z862 Personal history of diseases of the blood and blood-forming organs and certain disorders involving the immune mechanism: Secondary | ICD-10-CM

## 2019-01-12 DIAGNOSIS — I10 Essential (primary) hypertension: Secondary | ICD-10-CM | POA: Diagnosis not present

## 2019-01-12 DIAGNOSIS — R570 Cardiogenic shock: Secondary | ICD-10-CM | POA: Diagnosis not present

## 2019-01-12 DIAGNOSIS — E871 Hypo-osmolality and hyponatremia: Secondary | ICD-10-CM | POA: Diagnosis not present

## 2019-01-12 DIAGNOSIS — R6521 Severe sepsis with septic shock: Secondary | ICD-10-CM | POA: Diagnosis not present

## 2019-01-12 DIAGNOSIS — I5043 Acute on chronic combined systolic (congestive) and diastolic (congestive) heart failure: Secondary | ICD-10-CM | POA: Diagnosis present

## 2019-01-12 DIAGNOSIS — I428 Other cardiomyopathies: Secondary | ICD-10-CM | POA: Diagnosis present

## 2019-01-12 DIAGNOSIS — R1310 Dysphagia, unspecified: Secondary | ICD-10-CM | POA: Diagnosis not present

## 2019-01-12 DIAGNOSIS — J80 Acute respiratory distress syndrome: Secondary | ICD-10-CM | POA: Diagnosis present

## 2019-01-12 DIAGNOSIS — E662 Morbid (severe) obesity with alveolar hypoventilation: Secondary | ICD-10-CM | POA: Diagnosis present

## 2019-01-12 DIAGNOSIS — Z452 Encounter for adjustment and management of vascular access device: Secondary | ICD-10-CM

## 2019-01-12 DIAGNOSIS — D573 Sickle-cell trait: Secondary | ICD-10-CM | POA: Diagnosis present

## 2019-01-12 DIAGNOSIS — R5381 Other malaise: Secondary | ICD-10-CM | POA: Diagnosis not present

## 2019-01-12 DIAGNOSIS — E1165 Type 2 diabetes mellitus with hyperglycemia: Secondary | ICD-10-CM | POA: Diagnosis present

## 2019-01-12 DIAGNOSIS — A4189 Other specified sepsis: Secondary | ICD-10-CM | POA: Diagnosis not present

## 2019-01-12 DIAGNOSIS — J1282 Pneumonia due to coronavirus disease 2019: Secondary | ICD-10-CM | POA: Diagnosis present

## 2019-01-12 DIAGNOSIS — R0600 Dyspnea, unspecified: Secondary | ICD-10-CM

## 2019-01-12 DIAGNOSIS — E669 Obesity, unspecified: Secondary | ICD-10-CM | POA: Diagnosis not present

## 2019-01-12 DIAGNOSIS — J9600 Acute respiratory failure, unspecified whether with hypoxia or hypercapnia: Secondary | ICD-10-CM

## 2019-01-12 DIAGNOSIS — G7281 Critical illness myopathy: Secondary | ICD-10-CM | POA: Diagnosis not present

## 2019-01-12 DIAGNOSIS — Z794 Long term (current) use of insulin: Secondary | ICD-10-CM | POA: Diagnosis not present

## 2019-01-12 DIAGNOSIS — I519 Heart disease, unspecified: Secondary | ICD-10-CM | POA: Diagnosis present

## 2019-01-12 DIAGNOSIS — I5189 Other ill-defined heart diseases: Secondary | ICD-10-CM | POA: Diagnosis present

## 2019-01-12 DIAGNOSIS — E119 Type 2 diabetes mellitus without complications: Secondary | ICD-10-CM | POA: Diagnosis not present

## 2019-01-12 DIAGNOSIS — D6489 Other specified anemias: Secondary | ICD-10-CM | POA: Diagnosis not present

## 2019-01-12 DIAGNOSIS — D638 Anemia in other chronic diseases classified elsewhere: Secondary | ICD-10-CM | POA: Diagnosis present

## 2019-01-12 DIAGNOSIS — E1142 Type 2 diabetes mellitus with diabetic polyneuropathy: Secondary | ICD-10-CM | POA: Diagnosis not present

## 2019-01-12 DIAGNOSIS — E870 Hyperosmolality and hypernatremia: Secondary | ICD-10-CM | POA: Diagnosis not present

## 2019-01-12 DIAGNOSIS — U071 COVID-19: Principal | ICD-10-CM

## 2019-01-12 DIAGNOSIS — R609 Edema, unspecified: Secondary | ICD-10-CM | POA: Diagnosis not present

## 2019-01-12 DIAGNOSIS — K5900 Constipation, unspecified: Secondary | ICD-10-CM | POA: Diagnosis not present

## 2019-01-12 DIAGNOSIS — R579 Shock, unspecified: Secondary | ICD-10-CM | POA: Diagnosis not present

## 2019-01-12 DIAGNOSIS — Z6841 Body Mass Index (BMI) 40.0 and over, adult: Secondary | ICD-10-CM

## 2019-01-12 DIAGNOSIS — Z4659 Encounter for fitting and adjustment of other gastrointestinal appliance and device: Secondary | ICD-10-CM

## 2019-01-12 DIAGNOSIS — E876 Hypokalemia: Secondary | ICD-10-CM | POA: Diagnosis not present

## 2019-01-12 DIAGNOSIS — E874 Mixed disorder of acid-base balance: Secondary | ICD-10-CM | POA: Diagnosis not present

## 2019-01-12 DIAGNOSIS — T380X5A Adverse effect of glucocorticoids and synthetic analogues, initial encounter: Secondary | ICD-10-CM | POA: Diagnosis not present

## 2019-01-12 DIAGNOSIS — G4700 Insomnia, unspecified: Secondary | ICD-10-CM | POA: Diagnosis present

## 2019-01-12 DIAGNOSIS — J9690 Respiratory failure, unspecified, unspecified whether with hypoxia or hypercapnia: Secondary | ICD-10-CM

## 2019-01-12 DIAGNOSIS — R791 Abnormal coagulation profile: Secondary | ICD-10-CM | POA: Diagnosis not present

## 2019-01-12 DIAGNOSIS — N179 Acute kidney failure, unspecified: Secondary | ICD-10-CM | POA: Diagnosis present

## 2019-01-12 DIAGNOSIS — Z79899 Other long term (current) drug therapy: Secondary | ICD-10-CM

## 2019-01-12 DIAGNOSIS — M25512 Pain in left shoulder: Secondary | ICD-10-CM | POA: Diagnosis not present

## 2019-01-12 DIAGNOSIS — I110 Hypertensive heart disease with heart failure: Secondary | ICD-10-CM | POA: Diagnosis present

## 2019-01-12 DIAGNOSIS — A419 Sepsis, unspecified organism: Secondary | ICD-10-CM

## 2019-01-12 DIAGNOSIS — I5033 Acute on chronic diastolic (congestive) heart failure: Secondary | ICD-10-CM

## 2019-01-12 DIAGNOSIS — J9601 Acute respiratory failure with hypoxia: Secondary | ICD-10-CM

## 2019-01-12 DIAGNOSIS — I959 Hypotension, unspecified: Secondary | ICD-10-CM | POA: Diagnosis not present

## 2019-01-12 DIAGNOSIS — Z7982 Long term (current) use of aspirin: Secondary | ICD-10-CM

## 2019-01-12 DIAGNOSIS — E785 Hyperlipidemia, unspecified: Secondary | ICD-10-CM | POA: Diagnosis present

## 2019-01-12 DIAGNOSIS — F419 Anxiety disorder, unspecified: Secondary | ICD-10-CM | POA: Diagnosis present

## 2019-01-12 DIAGNOSIS — R7309 Other abnormal glucose: Secondary | ICD-10-CM | POA: Diagnosis not present

## 2019-01-12 DIAGNOSIS — Z9641 Presence of insulin pump (external) (internal): Secondary | ICD-10-CM | POA: Diagnosis present

## 2019-01-12 DIAGNOSIS — Z8249 Family history of ischemic heart disease and other diseases of the circulatory system: Secondary | ICD-10-CM

## 2019-01-12 DIAGNOSIS — R0602 Shortness of breath: Secondary | ICD-10-CM

## 2019-01-12 HISTORY — DX: Heart failure, unspecified: I50.9

## 2019-01-12 LAB — COMPREHENSIVE METABOLIC PANEL
ALT: 37 U/L (ref 0–44)
AST: 69 U/L — ABNORMAL HIGH (ref 15–41)
Albumin: 3 g/dL — ABNORMAL LOW (ref 3.5–5.0)
Alkaline Phosphatase: 65 U/L (ref 38–126)
Anion gap: 10 (ref 5–15)
BUN: 22 mg/dL — ABNORMAL HIGH (ref 6–20)
CO2: 28 mmol/L (ref 22–32)
Calcium: 8.6 mg/dL — ABNORMAL LOW (ref 8.9–10.3)
Chloride: 100 mmol/L (ref 98–111)
Creatinine, Ser: 1.17 mg/dL — ABNORMAL HIGH (ref 0.44–1.00)
GFR calc Af Amer: >60 mL/min (ref >60)
GFR calc non Af Amer: 54 mL/min — ABNORMAL LOW (ref >60)
Glucose, Bld: 233 mg/dL — ABNORMAL HIGH (ref 70–99)
Potassium: 4 mmol/L (ref 3.5–5.1)
Sodium: 138 mmol/L (ref 135–145)
Total Bilirubin: 0.6 mg/dL (ref 0.3–1.2)
Total Protein: 6.8 g/dL (ref 6.5–8.1)

## 2019-01-12 LAB — CBC WITH DIFFERENTIAL/PLATELET
Abs Immature Granulocytes: 0.03 10*3/uL (ref 0.00–0.07)
Basophils Absolute: 0 10*3/uL (ref 0.0–0.1)
Basophils Relative: 0 %
Eosinophils Absolute: 0 10*3/uL (ref 0.0–0.5)
Eosinophils Relative: 0 %
HCT: 38.6 % (ref 36.0–46.0)
Hemoglobin: 12.7 g/dL (ref 12.0–15.0)
Immature Granulocytes: 0 %
Lymphocytes Relative: 17 %
Lymphs Abs: 1.5 10*3/uL (ref 0.7–4.0)
MCH: 25.3 pg — ABNORMAL LOW (ref 26.0–34.0)
MCHC: 32.9 g/dL (ref 30.0–36.0)
MCV: 76.9 fL — ABNORMAL LOW (ref 80.0–100.0)
Monocytes Absolute: 0.3 10*3/uL (ref 0.1–1.0)
Monocytes Relative: 4 %
Neutro Abs: 6.9 10*3/uL (ref 1.7–7.7)
Neutrophils Relative %: 79 %
Platelets: 203 10*3/uL (ref 150–400)
RBC: 5.02 MIL/uL (ref 3.87–5.11)
RDW: 17.2 % — ABNORMAL HIGH (ref 11.5–15.5)
WBC: 8.7 10*3/uL (ref 4.0–10.5)
nRBC: 0 % (ref 0.0–0.2)

## 2019-01-12 LAB — SARS CORONAVIRUS 2 AG (30 MIN TAT): SARS Coronavirus 2 Ag: POSITIVE — AB

## 2019-01-12 LAB — TRIGLYCERIDES: Triglycerides: 141 mg/dL (ref <150)

## 2019-01-12 LAB — PREGNANCY, URINE: Preg Test, Ur: NEGATIVE

## 2019-01-12 LAB — LACTIC ACID, PLASMA: Lactic Acid, Venous: 1.1 mmol/L (ref 0.5–1.9)

## 2019-01-12 LAB — FERRITIN: Ferritin: 473 ng/mL — ABNORMAL HIGH (ref 11–307)

## 2019-01-12 LAB — TROPONIN I (HIGH SENSITIVITY)
Troponin I (High Sensitivity): 29 ng/L — ABNORMAL HIGH (ref <18)
Troponin I (High Sensitivity): 29 ng/L — ABNORMAL HIGH (ref <18)

## 2019-01-12 LAB — BRAIN NATRIURETIC PEPTIDE: B Natriuretic Peptide: 77.5 pg/mL (ref 0.0–100.0)

## 2019-01-12 LAB — LACTATE DEHYDROGENASE: LDH: 397 U/L — ABNORMAL HIGH (ref 98–192)

## 2019-01-12 LAB — PROCALCITONIN: Procalcitonin: <0.1 ng/mL

## 2019-01-12 LAB — FIBRINOGEN: Fibrinogen: >800 mg/dL — ABNORMAL HIGH (ref 210–475)

## 2019-01-12 LAB — C-REACTIVE PROTEIN: CRP: 19.5 mg/dL — ABNORMAL HIGH (ref <1.0)

## 2019-01-12 LAB — CBG MONITORING, ED: Glucose-Capillary: 218 mg/dL — ABNORMAL HIGH (ref 70–99)

## 2019-01-12 LAB — D-DIMER, QUANTITATIVE: D-Dimer, Quant: 1.54 ug/mL-FEU — ABNORMAL HIGH (ref 0.00–0.50)

## 2019-01-12 MED ORDER — SODIUM CHLORIDE 0.9 % IV SOLN
100.0000 mg | Freq: Every day | INTRAVENOUS | Status: AC
  Administered 2019-01-14 – 2019-01-17 (×4): 100 mg via INTRAVENOUS
  Filled 2019-01-12 (×5): qty 20

## 2019-01-12 MED ORDER — DEXAMETHASONE SODIUM PHOSPHATE 10 MG/ML IJ SOLN
10.0000 mg | Freq: Once | INTRAMUSCULAR | Status: AC
  Administered 2019-01-12: 10 mg via INTRAVENOUS
  Filled 2019-01-12: qty 1

## 2019-01-12 MED ORDER — ACETAMINOPHEN 500 MG PO TABS
1000.0000 mg | ORAL_TABLET | Freq: Once | ORAL | Status: AC
  Administered 2019-01-12: 19:00:00 1000 mg via ORAL
  Filled 2019-01-12: qty 2

## 2019-01-12 MED ORDER — SODIUM CHLORIDE 0.9 % IV SOLN
200.0000 mg | Freq: Once | INTRAVENOUS | Status: AC
  Administered 2019-01-13: 200 mg via INTRAVENOUS
  Filled 2019-01-12 (×2): qty 40

## 2019-01-12 NOTE — Progress Notes (Signed)
[facility] night shift.  Case reviewed and discussed with Dr.Schlossman. Accepted to [facility]/Progressive unit pending second troponin measurement result. After this is resulted, if cardiology service is not needed may proceed with transfer to [facility].   Tennis Must, MD

## 2019-01-12 NOTE — ED Triage Notes (Signed)
Pt SOb , nausea fever body aches x 4 days , son at home with covid

## 2019-01-12 NOTE — ED Provider Notes (Signed)
[facility] EMERGENCY DEPARTMENT Provider Note   CSN: [id number] Arrival date & time: 01/12/19  1730     History Chief Complaint  Patient presents with  . Shortness of Breath    Kaitlin [name] is a 54 y.o. female.  HPI      54 year old female with a history of diabetes, hypertension, hyperlipidemia, diastolic congestive heart failure, obesity, son who has tested positive for COVID-19, who presents with concern for 3 days of shortness of breath, fatigue, nausea, body aches, cough and fever.    Reports began to have dyspnea and symptoms a few days ago but they have progressively worsened. No vomiting, no diarrhea. Has had nausea, fever at home, body aches.  Fatigue is severe. Dyspnea has been worsening.  Cough has been productive.  No asymmetric leg pain or swelling. Reports some chest tightness.    Past Medical History:  Diagnosis Date  . Anemia   . Bronchitis   . Diabetes mellitus   . High cholesterol   . Hypertension    takes medicine to protect kidneys, does not have HTN  . Leukocytosis   . Morbid obesity ([place]) 09/11/2011   BMI 57  . NICM (nonischemic cardiomyopathy) ([place]) 05/01/2013   Overview:  Last Assessment & Plan:  euvolemic on physical exam.   . Obesity   . Sickle cell trait ([place])   . Thymoma     Patient Active Problem List   Diagnosis Date Noted  . Pneumonia due to COVID-19 virus 01/12/2019  . Grade I diastolic dysfunction [id number]  . 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 Dyspnea on exertion 05/19/2016  . Other fatigue 05/19/2016  . Absolute anemia 04/15/2016  . Left leg weakness 10/18/2014  . Left leg paresthesias 10/18/2014  . Acute left-sided weakness 10/18/2014  . Encounter for fitting or adjustment of insulin pump 03/08/2014  . Taking drug for chronic disease 03/08/2014  . H/O insertion of insulin pump 02/18/2014  . Long term current use of insulin ([place]) 02/18/2014  . Insulin pump in place 02/18/2014  . 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 Abnormal ballistocardiogram 05/18/2013  . Abnormal nuclear stress test 05/18/2013  . 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 NICM (nonischemic cardiomyopathy) ([place]) 05/01/2013  . Insulin dependent diabetes mellitus ([place]) 04/10/2013  . Sex counseling 04/10/2013  . Edema leg 04/10/2013  . Upper respiratory infection, viral 04/10/2013  . Pedal edema 03/29/2013  . Pneumonia 12/12/2012  . Essential hypertension 12/12/2012  . HLD (hyperlipidemia) 12/12/2012  . Tonsillar mass 12/01/2012  . 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 Leukocytosis   . Anemia   . High cholesterol   . Hypertension   . Blurred vision 09/11/2011  . Foot pain 09/11/2011  . Breast pain 09/11/2011  . Morbid obesity ([place]) 09/11/2011  . Fungal infection of nail 09/11/2011  . Avitaminosis D 09/11/2011    Past Surgical History:  Procedure Laterality Date  . COLONOSCOPY N/A 06/21/2017   Procedure: COLONOSCOPY;  Surgeon: Ronnette Juniper, MD;  Location: [facility];  Service: Gastroenterology;  Laterality: N/A;  . LEFT HEART CATHETERIZATION WITH CORONARY ANGIOGRAM N/A 05/25/2013   Procedure: LEFT HEART CATHETERIZATION WITH CORONARY ANGIOGRAM;  Surgeon: Troy Sine, MD;  Location: [facility] CATH LAB;  Service: Cardiovascular;  Laterality: N/A;  . LYMPH NODE BIOPSY Right 11/30/2012   Procedure: RIGHT TONSIL BIOPSY WITH FRESH FROZEN ANALYSIS;  Surgeon: Jodi Marble, MD;  Location: [facility];  Service: ENT;  Laterality: Right;  . MEDIASTERNOTOMY N/A 01/12/2013  Procedure: MEDIAN STERNOTOMY;  Surgeon: Gaye Pollack, MD;  Location: [facility];  Service: Thoracic;  Laterality: N/A;  . MEDIASTINOTOMY CHAMBERLAIN MCNEIL Right 11/06/2012   Procedure: MEDIASTINOTOMY CHAMBERLAIN MCNEILPROCEDURE;  Surgeon: Gaye Pollack, MD;  Location: [facility];  Service: Thoracic;  Laterality: Right;  . POLYPECTOMY  06/21/2017   Procedure: POLYPECTOMY;  Surgeon: Ronnette Juniper, MD;  Location: [facility];  Service: Gastroenterology;;  .  RESECTION OF A THYMOMA N/A 01/12/2013   Procedure: RESECTION OF A THYMOMA;  Surgeon: Gaye Pollack, MD;  Location: [facility];  Service: Thoracic;  Laterality: N/A;  . TONSILLECTOMY    . TUBAL LIGATION       OB History   No obstetric history on file.     Family History  Problem Relation Age of Onset  . Heart disease Father        No details  . Diabetes type II Other        Family HX  . Breast cancer Other     Social History   Tobacco Use  . Smoking status: Passive Smoke Exposure - Never Smoker  . Smokeless tobacco: Never Used  Substance Use Topics  . Alcohol use: No  . Drug use: No    Home Medications Prior to Admission medications   Medication Sig Start Date End Date Taking? Authorizing Provider  acetaminophen (TYLENOL) 500 MG tablet Take 1,000 mg by mouth every 6 (six) hours as needed for moderate pain or headache.    [provider]  albuterol (PROVENTIL HFA;VENTOLIN HFA) 108 (90 Base) MCG/ACT inhaler Inhale 1-2 puffs into the lungs every 6 (six) hours as needed for wheezing or shortness of breath. Patient taking differently: Inhale 2 puffs into the lungs every 6 (six) hours as needed for wheezing or shortness of breath.  02/10/15   Drenda Freeze, MD  aspirin EC 81 MG tablet Take 1 tablet (81 mg total) by mouth daily. 05/18/16   Minus Breeding, MD  azithromycin (ZITHROMAX) 250 MG tablet  06/02/17   [provider]  benzonatate (TESSALON) 200 MG capsule Take 1 capsule (200 mg total) by mouth 3 (three) times daily. 11/19/18   Tedd Sias, PA  Calcium-Phosphorus-Vitamin D (CALCIUM GUMMIES PO) Take 2 tablets by mouth daily.    [provider]  carvedilol (COREG) 25 MG tablet TAKE 1 TABLET BY MOUTH TWICE DAILY WITH A MEAL 07/11/18   Minus Breeding, MD  Cholecalciferol (VITAMIN D-3) 5000 units TABS Take 5,000 Units by mouth daily.    [provider]  ciclopirox (PENLAC) 8 % solution Apply topically at bedtime. Apply over nail and surrounding  skin. Apply daily over previous coat. Remove weekly with polish remover. 06/17/17   Marzetta Board, DPM  ELDERBERRY PO Take 1 tablet by mouth 2 (two) times daily.    [provider]  fexofenadine (ALLEGRA) 180 MG tablet Take 180 mg by mouth daily.    [provider]  furosemide (LASIX) 40 MG tablet Take 1 tablet (40 mg total) by mouth daily. Patient taking differently: Take 40 mg by mouth 2 (two) times daily.  06/14/16   Minus Breeding, MD  GAVILYTE-G 236 g solution  06/16/17   [provider]  glucose blood (BAYER CONTOUR NEXT TEST) test strip as directed. 05/11/16   [provider]  insulin aspart (NOVOLOG) 100 UNIT/ML injection Use approximately 200 units via insulin pump  DX E11.9   And Z96.41 03/29/16   [provider]  lidocaine (LIDODERM) 5 % Place  3 patches onto the skin daily. Remove & Discard patch within 12 hours or as directed by MD 11/18/14   Melvenia Beam, MD  lisinopril (PRINIVIL,ZESTRIL) 10 MG tablet Take 1 tablet (10 mg total) by mouth daily. PLEASE CONTACT OFFICE FOR ADDITIONAL REFILLS 12/19/15   Minus Breeding, MD  metFORMIN (GLUCOPHAGE) 1000 MG tablet Take 1,000 mg by mouth 2 (two) times daily with a meal.    [provider]  Multiple Vitamin (MULTIVITAMIN WITH MINERALS) TABS Take 1 tablet by mouth every morning.     [provider]  Multiple Vitamins-Minerals (EMERGEN-C IMMUNE PO) Take 1 packet by mouth 2 (two) times daily.    [provider]  Multiple Vitamins-Minerals (HAIR SKIN AND NAILS FORMULA) TABS Take 2 tablets by mouth daily.    [provider]  Multiple Vitamins-Minerals (PRESERVISION/LUTEIN PO) Take 2 tablets by mouth 2 (two) times daily.    [provider]  Phenylephrine-APAP-Guaifenesin [facility]) [phone] MG/20ML LIQD Take 2 capsules by mouth daily. 04/16/15   Wyatt Portela, MD  potassium chloride SA (K-DUR,KLOR-CON) 20 MEQ tablet Take 1 tablet (20 mEq total) by mouth  daily. 06/14/16   Minus Breeding, MD  predniSONE (DELTASONE) 10 MG tablet Take 10 mg by mouth daily with breakfast.    [provider]  rosuvastatin (CRESTOR) 20 MG tablet Take 20 mg by mouth every evening. 12/13/14 06/14/17  [provider]    Allergies    Amoxicillin-pot clavulanate and Ibuprofen  Review of Systems   Review of Systems  Constitutional: Positive for appetite change, chills, fatigue and fever.  HENT: Positive for congestion. Negative for sore throat.   Eyes: Negative for visual disturbance.  Respiratory: Positive for cough. Negative for shortness of breath.   Cardiovascular: Negative for chest pain.  Gastrointestinal: Positive for nausea. Negative for abdominal pain, diarrhea and vomiting.  Genitourinary: Negative for difficulty urinating.  Musculoskeletal: Positive for arthralgias and myalgias. Negative for back pain and neck pain.  Skin: Negative for rash.  Neurological: Negative for syncope and headaches.    Physical Exam Updated Vital Signs BP 100/61 (BP Location: Right Arm)   Pulse (!) 101   Temp (!) 100.9 F (38.3 C) (Oral)   Resp (!) 31   Ht 5\' 9"  (1.753 m)   LMP 06/17/2012   SpO2 100%   BMI 55.97 kg/m   Physical Exam Vitals and nursing note reviewed.  Constitutional:      General: She is in acute distress.     Appearance: She is well-developed. She is obese. She is ill-appearing. She is not diaphoretic.  HENT:     Head: Normocephalic and atraumatic.  Eyes:     Conjunctiva/sclera: Conjunctivae normal.  Cardiovascular:     Rate and Rhythm: Normal rate and regular rhythm.     Heart sounds: Normal heart sounds. No murmur. No friction rub. No gallop.   Pulmonary:     Effort: Tachypnea and respiratory distress present.     Breath sounds: Decreased breath sounds (throughout) present. No wheezing or rales.  Abdominal:     General: There is no distension.     Palpations: Abdomen is soft.     Tenderness: There is no abdominal  tenderness. There is no guarding.  Musculoskeletal:        General: No tenderness.     Cervical back: Normal range of motion.     Right lower leg: Right lower leg edema: trace.     Left lower leg: Left lower leg edema: trace.  Skin:    General: Skin is warm and dry.     Findings: No erythema or rash.  Neurological:     Mental Status: She is alert and oriented to person, place, and time.     ED Results / Procedures / Treatments   Labs (all labs ordered are listed, but only abnormal results are displayed) Labs Reviewed  SARS CORONAVIRUS 2 AG (30 MIN TAT) - Abnormal; Notable for the following components:      Result Value   SARS Coronavirus 2 Ag POSITIVE (*)    All other components within normal limits  CBC WITH DIFFERENTIAL/PLATELET - Abnormal; Notable for the following components:   MCV 76.9 (*)    MCH 25.3 (*)    RDW 17.2 (*)    All other components within normal limits  COMPREHENSIVE METABOLIC PANEL - Abnormal; Notable for the following components:   Glucose, Bld 233 (*)    BUN 22 (*)    Creatinine, Ser 1.17 (*)    Calcium 8.6 (*)    Albumin 3.0 (*)    AST 69 (*)    GFR calc non Af Amer 54 (*)    All other components within normal limits  D-DIMER, QUANTITATIVE (NOT AT [facility]) - Abnormal; Notable for the following components:   D-Dimer, Quant 1.54 (*)    All other components within normal limits  LACTATE DEHYDROGENASE - Abnormal; Notable for the following components:   LDH 397 (*)    All other components within normal limits  FERRITIN - Abnormal; Notable for the following components:   Ferritin 473 (*)    All other components within normal limits  FIBRINOGEN - Abnormal; Notable for the following components:   Fibrinogen >800 (*)    All other components within normal limits  C-REACTIVE PROTEIN - Abnormal; Notable for the following components:   CRP 19.5 (*)    All other components within normal limits  CBG MONITORING, ED - Abnormal; Notable for the following  components:   Glucose-Capillary 218 (*)    All other components within normal limits  TROPONIN I (HIGH SENSITIVITY) - Abnormal; Notable for the following components:   Troponin I (High Sensitivity) 29 (*)    All other components within normal limits  TROPONIN I (HIGH SENSITIVITY) - Abnormal; Notable for the following components:   Troponin I (High Sensitivity) 29 (*)    All other components within normal limits  CULTURE, BLOOD (ROUTINE X 2)  CULTURE, BLOOD (ROUTINE X 2)  LACTIC ACID, PLASMA  PROCALCITONIN  TRIGLYCERIDES  PREGNANCY, URINE  BRAIN NATRIURETIC PEPTIDE    EKG EKG Interpretation  Date/Time:  Friday January 12 2019 19:44:16 EST Ventricular Rate:  102 PR Interval:    QRS Duration: 91 QT Interval:  358 QTC Calculation: 467 R Axis:   -33 Text Interpretation: Sinus tachycardia Ventricular premature complex Left axis deviation Low voltage, precordial leads No significant change since last tracing , rate has increased Confirmed by Gareth Morgan [id number]) on 01/12/2019 8:47:14 PM   Radiology DG Chest Port 1 View  Result Date: 01/12/2019 CLINICAL DATA:  Sob, fever, body aches and nausea, family member at home is COVID +//ac EXAM: PORTABLE CHEST 1 VIEW COMPARISON:  Chest radiograph 11/19/2018 FINDINGS: Stable cardiomediastinal contours status post median sternotomy. There are new bilateral diffuse patchy airspace opacities concerning for multifocal pneumonia. No pneumothorax or large pleural effusion. No acute finding in the visualized skeleton. IMPRESSION: Bilateral diffuse patchy airspace opacities concerning for multifocal pneumonia. Follow-up to resolution recommended. Electronically Signed  By: Audie Pinto M.D.   On: 01/12/2019 19:47    Procedures .Critical Care Performed by: Gareth Morgan, MD Authorized by: Gareth Morgan, MD   Critical care provider statement:    Critical care time (minutes):  45   Critical care was time spent personally by me on the  following activities:  Evaluation of patient's response to treatment, examination of patient, ordering and performing treatments and interventions, ordering and review of laboratory studies, ordering and review of radiographic studies, pulse oximetry, re-evaluation of patient's condition, obtaining history from patient or surrogate, review of old charts and development of treatment plan with patient or surrogate   (including critical care time)  Medications Ordered in ED Medications  remdesivir 200 mg in sodium chloride 0.9% 250 mL IVPB (has no administration in time range)    Followed by  remdesivir 100 mg in sodium chloride 0.9 % 100 mL IVPB (has no administration in time range)  acetaminophen (TYLENOL) tablet 1,000 mg (1,000 mg Oral Given 01/12/19 1845)  dexamethasone (DECADRON) injection 10 mg (10 mg Intravenous Given 01/12/19 1938)    ED Course  I have reviewed the triage vital signs and the nursing notes.  Pertinent labs & imaging results that were available during my care of the patient were reviewed by me and considered in my medical decision making (see chart for details).    MDM Rules/Calculators/A&P                      54 year old female with a history of diabetes, hypertension, hyperlipidemia, diastolic congestive heart failure, obesity, son who has tested positive for COVID-19, who presents with concern for 3 days of shortness of breath, fatigue, nausea, body aches, cough and fever.  On arrival to the emergency department, patient is febrile to 103.2, tachycardic to the 110s, tachypneic with increased work of breathing, and oxygen saturation of 79%.  She was placed on high flow nasal cannula given hypoxia and work of breathing with improvement of both oxygenation and work of breathing.  History, CXR consistent with COVID 19 infection. Rapid test returned positive.  No signs of bacterial pneumonia, CHF exacerbation. Troponin elevation likely in setting of stress, hypoxia, not  consistent with ACS and stable on recheck.  Given decadron. Plan to admit to [facility], on HFNC.  Currently awaiting bed. Oncoming ED provider aware. Remdesevir ordered in case of delay in bed availability.   Final Clinical Impression(s) / ED Diagnoses Final diagnoses:  Acute respiratory failure with hypoxia ([place])  COVID-19    Rx / DC Orders ED Discharge Orders    None       Gareth Morgan, MD 01/13/19 0021

## 2019-01-12 NOTE — ED Notes (Signed)
Pt c/o cough sob x 4-5 days  Getting worse each day

## 2019-01-13 ENCOUNTER — Encounter (HOSPITAL_COMMUNITY): Payer: Self-pay | Admitting: Internal Medicine

## 2019-01-13 DIAGNOSIS — J9601 Acute respiratory failure with hypoxia: Secondary | ICD-10-CM

## 2019-01-13 DIAGNOSIS — I10 Essential (primary) hypertension: Secondary | ICD-10-CM

## 2019-01-13 DIAGNOSIS — I959 Hypotension, unspecified: Secondary | ICD-10-CM | POA: Diagnosis present

## 2019-01-13 LAB — D-DIMER, QUANTITATIVE: D-Dimer, Quant: 1.29 ug/mL-FEU — ABNORMAL HIGH (ref 0.00–0.50)

## 2019-01-13 LAB — CBC WITH DIFFERENTIAL/PLATELET
Abs Immature Granulocytes: 0.05 10*3/uL (ref 0.00–0.07)
Basophils Absolute: 0 10*3/uL (ref 0.0–0.1)
Basophils Relative: 0 %
Eosinophils Absolute: 0 10*3/uL (ref 0.0–0.5)
Eosinophils Relative: 0 %
HCT: 35.4 % — ABNORMAL LOW (ref 36.0–46.0)
Hemoglobin: 11.6 g/dL — ABNORMAL LOW (ref 12.0–15.0)
Immature Granulocytes: 1 %
Lymphocytes Relative: 21 %
Lymphs Abs: 1.3 10*3/uL (ref 0.7–4.0)
MCH: 25.3 pg — ABNORMAL LOW (ref 26.0–34.0)
MCHC: 32.8 g/dL (ref 30.0–36.0)
MCV: 77.1 fL — ABNORMAL LOW (ref 80.0–100.0)
Monocytes Absolute: 0.2 10*3/uL (ref 0.1–1.0)
Monocytes Relative: 4 %
Neutro Abs: 4.6 10*3/uL (ref 1.7–7.7)
Neutrophils Relative %: 74 %
Platelets: 207 10*3/uL (ref 150–400)
RBC: 4.59 MIL/uL (ref 3.87–5.11)
RDW: 17.2 % — ABNORMAL HIGH (ref 11.5–15.5)
WBC: 6.1 10*3/uL (ref 4.0–10.5)
nRBC: 0 % (ref 0.0–0.2)

## 2019-01-13 LAB — BRAIN NATRIURETIC PEPTIDE: B Natriuretic Peptide: 70.6 pg/mL (ref 0.0–100.0)

## 2019-01-13 LAB — COMPREHENSIVE METABOLIC PANEL
ALT: 33 U/L (ref 0–44)
AST: 52 U/L — ABNORMAL HIGH (ref 15–41)
Albumin: 2.6 g/dL — ABNORMAL LOW (ref 3.5–5.0)
Alkaline Phosphatase: 57 U/L (ref 38–126)
Anion gap: 11 (ref 5–15)
BUN: 17 mg/dL (ref 6–20)
CO2: 26 mmol/L (ref 22–32)
Calcium: 8.4 mg/dL — ABNORMAL LOW (ref 8.9–10.3)
Chloride: 104 mmol/L (ref 98–111)
Creatinine, Ser: 0.76 mg/dL (ref 0.44–1.00)
GFR calc Af Amer: >60 mL/min (ref >60)
GFR calc non Af Amer: >60 mL/min (ref >60)
Glucose, Bld: 149 mg/dL — ABNORMAL HIGH (ref 70–99)
Potassium: 4.1 mmol/L (ref 3.5–5.1)
Sodium: 141 mmol/L (ref 135–145)
Total Bilirubin: 0.6 mg/dL (ref 0.3–1.2)
Total Protein: 6.4 g/dL — ABNORMAL LOW (ref 6.5–8.1)

## 2019-01-13 LAB — HIV ANTIBODY (ROUTINE TESTING W REFLEX): HIV Screen 4th Generation wRfx: NONREACTIVE

## 2019-01-13 LAB — HEMOGLOBIN A1C
Hgb A1c MFr Bld: 7.1 % — ABNORMAL HIGH (ref 4.8–5.6)
Mean Plasma Glucose: 157.07 mg/dL

## 2019-01-13 LAB — PROCALCITONIN: Procalcitonin: 0.1 ng/mL

## 2019-01-13 LAB — ABO/RH: ABO/RH(D): A POS

## 2019-01-13 LAB — GLUCOSE, CAPILLARY
Glucose-Capillary: 154 mg/dL — ABNORMAL HIGH (ref 70–99)
Glucose-Capillary: 245 mg/dL — ABNORMAL HIGH (ref 70–99)
Glucose-Capillary: 353 mg/dL — ABNORMAL HIGH (ref 70–99)

## 2019-01-13 LAB — MAGNESIUM: Magnesium: 2.2 mg/dL (ref 1.7–2.4)

## 2019-01-13 LAB — C-REACTIVE PROTEIN: CRP: 19.3 mg/dL — ABNORMAL HIGH (ref <1.0)

## 2019-01-13 MED ORDER — INSULIN ASPART 100 UNIT/ML ~~LOC~~ SOLN
0.0000 [IU] | Freq: Three times a day (TID) | SUBCUTANEOUS | Status: DC
  Administered 2019-01-13: 15 [IU] via SUBCUTANEOUS
  Administered 2019-01-13 – 2019-01-14 (×2): 5 [IU] via SUBCUTANEOUS
  Administered 2019-01-14: 11 [IU] via SUBCUTANEOUS
  Administered 2019-01-14: 5 [IU] via SUBCUTANEOUS
  Administered 2019-01-15: 3 [IU] via SUBCUTANEOUS
  Administered 2019-01-15: 5 [IU] via SUBCUTANEOUS

## 2019-01-13 MED ORDER — ASCORBIC ACID 500 MG PO TABS
500.0000 mg | ORAL_TABLET | Freq: Every day | ORAL | Status: DC
  Administered 2019-01-13 – 2019-01-15 (×3): 500 mg via ORAL
  Filled 2019-01-13 (×3): qty 1

## 2019-01-13 MED ORDER — SODIUM CHLORIDE 0.9 % IV BOLUS
1000.0000 mL | Freq: Once | INTRAVENOUS | Status: AC
  Administered 2019-01-13: 1000 mL via INTRAVENOUS

## 2019-01-13 MED ORDER — ACETAMINOPHEN 500 MG PO TABS
1000.0000 mg | ORAL_TABLET | Freq: Once | ORAL | Status: AC
  Administered 2019-01-13: 1000 mg via ORAL
  Filled 2019-01-13: qty 2

## 2019-01-13 MED ORDER — TOCILIZUMAB 400 MG/20ML IV SOLN
700.0000 mg | Freq: Once | INTRAVENOUS | Status: AC
  Administered 2019-01-13: 12:00:00 700 mg via INTRAVENOUS
  Filled 2019-01-13: qty 35

## 2019-01-13 MED ORDER — INSULIN ASPART 100 UNIT/ML ~~LOC~~ SOLN
10.0000 [IU] | Freq: Once | SUBCUTANEOUS | Status: AC
  Administered 2019-01-13: 10 [IU] via SUBCUTANEOUS

## 2019-01-13 MED ORDER — DEXAMETHASONE 6 MG PO TABS
6.0000 mg | ORAL_TABLET | Freq: Every day | ORAL | Status: DC
  Administered 2019-01-14 – 2019-01-15 (×2): 6 mg via ORAL
  Filled 2019-01-13 (×3): qty 1

## 2019-01-13 MED ORDER — ACETAMINOPHEN 325 MG PO TABS
650.0000 mg | ORAL_TABLET | Freq: Four times a day (QID) | ORAL | Status: DC | PRN
  Administered 2019-01-13 – 2019-01-30 (×20): 650 mg via ORAL
  Filled 2019-01-13 (×21): qty 2

## 2019-01-13 MED ORDER — ONDANSETRON HCL 4 MG PO TABS: 4.0000 mg | ORAL_TABLET | Freq: Four times a day (QID) | ORAL | Status: DC | PRN

## 2019-01-13 MED ORDER — IPRATROPIUM-ALBUTEROL 20-100 MCG/ACT IN AERS
2.0000 | INHALATION_SPRAY | Freq: Four times a day (QID) | RESPIRATORY_TRACT | Status: DC
  Administered 2019-01-13 – 2019-01-15 (×10): 2 via RESPIRATORY_TRACT
  Filled 2019-01-13: qty 4

## 2019-01-13 MED ORDER — ACETAMINOPHEN 650 MG RE SUPP: 650.0000 mg | Freq: Four times a day (QID) | RECTAL | Status: DC | PRN

## 2019-01-13 MED ORDER — ENOXAPARIN SODIUM 100 MG/ML ~~LOC~~ SOLN
85.0000 mg | SUBCUTANEOUS | Status: DC
  Administered 2019-01-13 – 2019-01-16 (×4): 85 mg via SUBCUTANEOUS
  Filled 2019-01-13 (×4): qty 1

## 2019-01-13 MED ORDER — INSULIN DETEMIR 100 UNIT/ML ~~LOC~~ SOLN
40.0000 [IU] | Freq: Two times a day (BID) | SUBCUTANEOUS | Status: DC
  Administered 2019-01-13: 40 [IU] via SUBCUTANEOUS
  Filled 2019-01-13 (×2): qty 0.4

## 2019-01-13 MED ORDER — HYDROCOD POLST-CPM POLST ER 10-8 MG/5ML PO SUER
5.0000 mL | Freq: Two times a day (BID) | ORAL | Status: DC | PRN
  Administered 2019-01-14 – 2019-01-15 (×2): 5 mL via ORAL
  Filled 2019-01-13 (×2): qty 5

## 2019-01-13 MED ORDER — INSULIN ASPART 100 UNIT/ML ~~LOC~~ SOLN
0.0000 [IU] | Freq: Every day | SUBCUTANEOUS | Status: DC
  Administered 2019-01-14: 5 [IU] via SUBCUTANEOUS
  Administered 2019-01-15: 4 [IU] via SUBCUTANEOUS

## 2019-01-13 MED ORDER — GUAIFENESIN-DM 100-10 MG/5ML PO SYRP
10.0000 mL | ORAL_SOLUTION | ORAL | Status: DC | PRN
  Administered 2019-01-14 – 2019-01-15 (×2): 10 mL via ORAL
  Filled 2019-01-13 (×2): qty 10

## 2019-01-13 MED ORDER — ZINC SULFATE 220 (50 ZN) MG PO CAPS
220.0000 mg | ORAL_CAPSULE | Freq: Every day | ORAL | Status: DC
  Administered 2019-01-13 – 2019-01-15 (×3): 220 mg via ORAL
  Filled 2019-01-13 (×3): qty 1

## 2019-01-13 MED ORDER — INSULIN DETEMIR 100 UNIT/ML ~~LOC~~ SOLN
13.0000 [IU] | Freq: Once | SUBCUTANEOUS | Status: AC
  Administered 2019-01-13: 13 [IU] via SUBCUTANEOUS
  Filled 2019-01-13: qty 0.13

## 2019-01-13 MED ORDER — ONDANSETRON HCL 4 MG/2ML IJ SOLN
4.0000 mg | Freq: Four times a day (QID) | INTRAMUSCULAR | Status: DC | PRN
  Administered 2019-01-26 – 2019-01-28 (×3): 4 mg via INTRAVENOUS
  Filled 2019-01-13 (×3): qty 2

## 2019-01-13 MED ORDER — DEXAMETHASONE 6 MG PO TABS
6.0000 mg | ORAL_TABLET | Freq: Two times a day (BID) | ORAL | Status: DC
  Administered 2019-01-13: 6 mg via ORAL
  Filled 2019-01-13: qty 1

## 2019-01-13 MED ORDER — INSULIN DETEMIR 100 UNIT/ML ~~LOC~~ SOLN
0.1500 [IU]/kg | Freq: Two times a day (BID) | SUBCUTANEOUS | Status: DC
  Administered 2019-01-13: 27 [IU] via SUBCUTANEOUS
  Filled 2019-01-13: qty 0.27

## 2019-01-13 MED ORDER — INSULIN ASPART 100 UNIT/ML ~~LOC~~ SOLN: 0.0000 [IU] | SUBCUTANEOUS | Status: DC

## 2019-01-13 MED ORDER — ENOXAPARIN SODIUM 40 MG/0.4ML ~~LOC~~ SOLN: 40.0000 mg | SUBCUTANEOUS | Status: DC

## 2019-01-13 MED ORDER — INSULIN ASPART 100 UNIT/ML ~~LOC~~ SOLN
4.0000 [IU] | Freq: Three times a day (TID) | SUBCUTANEOUS | Status: DC
  Administered 2019-01-13 – 2019-01-14 (×3): 4 [IU] via SUBCUTANEOUS

## 2019-01-13 NOTE — Plan of Care (Signed)
Pt will remain free from falls and injury and VS stable throughout shift.

## 2019-01-13 NOTE — Plan of Care (Signed)
  Problem: Education: Goal: Knowledge of General Education information will improve Description: Including pain rating scale, medication(s)/side effects and non-pharmacologic comfort measures 01/13/2019 1205 by Orvan Falconer, RN Outcome: Progressing 01/13/2019 1205 by Orvan Falconer, RN Outcome: Progressing 01/13/2019 1205 by Orvan Falconer, RN Outcome: Progressing   Problem: Health Behavior/Discharge Planning: Goal: Ability to manage health-related needs will improve 01/13/2019 1205 by Orvan Falconer, RN Outcome: Progressing 01/13/2019 1205 by Orvan Falconer, RN Outcome: Progressing 01/13/2019 1205 by Orvan Falconer, RN Outcome: Progressing   Problem: Clinical Measurements: Goal: Ability to maintain clinical measurements within normal limits will improve 01/13/2019 1205 by Orvan Falconer, RN Outcome: Progressing 01/13/2019 1205 by Orvan Falconer, RN Outcome: Progressing 01/13/2019 1205 by Orvan Falconer, RN Outcome: Progressing Goal: Will remain free from infection 01/13/2019 1205 by Orvan Falconer, RN Outcome: Progressing 01/13/2019 1205 by Orvan Falconer, RN Outcome: Progressing 01/13/2019 1205 by Orvan Falconer, RN Outcome: Progressing Goal: Diagnostic test results will improve 01/13/2019 1205 by Orvan Falconer, RN Outcome: Progressing 01/13/2019 1205 by Orvan Falconer, RN Outcome: Progressing 01/13/2019 1205 by Orvan Falconer, RN Outcome: Progressing Goal: Respiratory complications will improve 01/13/2019 1205 by Orvan Falconer, RN Outcome: Progressing 01/13/2019 1205 by Orvan Falconer, RN Outcome: Progressing 01/13/2019 1205 by Orvan Falconer, RN Outcome: Progressing Goal: Cardiovascular complication will be avoided 01/13/2019 1205 by Orvan Falconer, RN Outcome: Progressing 01/13/2019 1205 by Orvan Falconer, RN Outcome: Progressing 01/13/2019 1205 by Orvan Falconer, RN Outcome: Progressing   Problem:  Activity: Goal: Risk for activity intolerance will decrease 01/13/2019 1205 by Orvan Falconer, RN Outcome: Progressing 01/13/2019 1205 by Orvan Falconer, RN Outcome: Progressing 01/13/2019 1205 by Orvan Falconer, RN Outcome: Progressing   Problem: Nutrition: Goal: Adequate nutrition will be maintained 01/13/2019 1205 by Orvan Falconer, RN Outcome: Progressing 01/13/2019 1205 by Orvan Falconer, RN Outcome: Progressing 01/13/2019 1205 by Orvan Falconer, RN Outcome: Progressing   Problem: Coping: Goal: Level of anxiety will decrease 01/13/2019 1205 by Orvan Falconer, RN Outcome: Progressing 01/13/2019 1205 by Orvan Falconer, RN Outcome: Progressing 01/13/2019 1205 by Orvan Falconer, RN Outcome: Progressing   Problem: Elimination: Goal: Will not experience complications related to bowel motility 01/13/2019 1205 by Orvan Falconer, RN Outcome: Progressing 01/13/2019 1205 by Orvan Falconer, RN Outcome: Progressing 01/13/2019 1205 by Orvan Falconer, RN Outcome: Progressing Goal: Will not experience complications related to urinary retention 01/13/2019 1205 by Orvan Falconer, RN Outcome: Progressing 01/13/2019 1205 by Orvan Falconer, RN Outcome: Progressing 01/13/2019 1205 by Orvan Falconer, RN Outcome: Progressing   Problem: Pain Managment: Goal: General experience of comfort will improve 01/13/2019 1205 by Orvan Falconer, RN Outcome: Progressing 01/13/2019 1205 by Orvan Falconer, RN Outcome: Progressing 01/13/2019 1205 by Orvan Falconer, RN Outcome: Progressing   Problem: Safety: Goal: Ability to remain free from injury will improve 01/13/2019 1205 by Orvan Falconer, RN Outcome: Progressing 01/13/2019 1205 by Orvan Falconer, RN Outcome: Progressing 01/13/2019 1205 by Orvan Falconer, RN Outcome: Progressing   Problem: Skin Integrity: Goal: Risk for impaired skin integrity will decrease 01/13/2019 1205 by Orvan Falconer, RN Outcome: Progressing 01/13/2019 1205 by Orvan Falconer, RN Outcome: Progressing 01/13/2019 1205 by Orvan Falconer, RN Outcome: Progressing

## 2019-01-13 NOTE — H&P (Signed)
History and Physical    Kaitlin [name] [id number] DOB: 08/17/1965 DOA: 01/12/2019  PCP: [facility]   Patient coming from: Home.  I have personally briefly reviewed patient's old medical records in [place]  Chief Complaint: Shortness of breath, nausea and weakness.  HPI: Kaitlin [name] is a 54 y.o. female with medical history significant of anemia, bronchitis, bilateral cataracts, chronic diastolic CHF, type 2 diabetes, hyperlipidemia, hypertension, leukocytosis, morbid obesity, peripheral neuropathy, sickle cell trait, history of thymoma with history of thymoma resection who is coming to the emergency department due to progressively worse dyspnea, associated with nonproductive cough, sinus headache, rhinorrhea, sore throat fatigue, nausea, decreased appetite and sleep for 5 days after she was exposed to her son who had tested positive for COVID-19.  She denies chest pressure, but states that she has been having pleuritic chest pain.  No dizziness, palpitations, PND or lower extremity edema.  She mentions that she has been taking her furosemide daily.  She denies abdominal pain, emesis, diarrhea, constipation, melena or hematochezia.  No dysuria, frequency or hematuria.  ED Course: White count was 8.7, hemoglobin 12.7 g/dL and platelets 203.  Lactic acid was normal.  CMP shows normal electrolytes when calcium corrected to albumin.  Her glucose 233, BUN 22 and creatinine 1.17 mg/dL.  Troponin x2 was 29 ng/L.  D-dimer was 1.54, LDH 397, triglycerides 141, fibrinogen 800 mg/dL, CRP 19.5 and ferritin 473.  Her procalcitonin was normal.  Chest radiograph shows bilateral diffuse patchy airspace opacities concerning for multifocal pneumonia  Review of Systems: As per HPI otherwise 10 point review of systems negative.   Past Medical History:  Diagnosis Date  . Anemia   . Bronchitis   . Cataracts, bilateral 01/12/2018  . CHF (congestive heart failure) ([place])   .  Diabetes mellitus   . High cholesterol   . Hypertension    takes medicine to protect kidneys, does not have HTN  . Leukocytosis   . Morbid obesity ([place]) 09/11/2011   BMI 57  . Neuropathy 01/12/2018  . NICM (nonischemic cardiomyopathy) ([place]) 05/01/2013   Overview:  Last Assessment & Plan:  euvolemic on physical exam.   . Obesity   . Sickle cell trait ([place])   . Thymoma     Past Surgical History:  Procedure Laterality Date  . COLONOSCOPY N/A 06/21/2017   Procedure: COLONOSCOPY;  Surgeon: Ronnette Juniper, MD;  Location: [facility];  Service: Gastroenterology;  Laterality: N/A;  . LEFT HEART CATHETERIZATION WITH CORONARY ANGIOGRAM N/A 05/25/2013   Procedure: LEFT HEART CATHETERIZATION WITH CORONARY ANGIOGRAM;  Surgeon: Troy Sine, MD;  Location: [facility] CATH LAB;  Service: Cardiovascular;  Laterality: N/A;  . LYMPH NODE BIOPSY Right 11/30/2012   Procedure: RIGHT TONSIL BIOPSY WITH FRESH FROZEN ANALYSIS;  Surgeon: Jodi Marble, MD;  Location: [place];  Service: ENT;  Laterality: Right;  . MEDIASTERNOTOMY N/A 01/12/2013   Procedure: MEDIAN STERNOTOMY;  Surgeon: Gaye Pollack, MD;  Location: [place];  Service: Thoracic;  Laterality: N/A;  . MEDIASTINOTOMY CHAMBERLAIN MCNEIL Right 11/06/2012   Procedure: MEDIASTINOTOMY CHAMBERLAIN MCNEILPROCEDURE;  Surgeon: Gaye Pollack, MD;  Location: [place];  Service: Thoracic;  Laterality: Right;  . POLYPECTOMY  06/21/2017   Procedure: POLYPECTOMY;  Surgeon: Ronnette Juniper, MD;  Location: [facility];  Service: Gastroenterology;;  . RESECTION OF A THYMOMA N/A 01/12/2013   Procedure: RESECTION OF A THYMOMA;  Surgeon: Gaye Pollack, MD;  Location: [place];  Service: Thoracic;  Laterality: N/A;  .  TONSILLECTOMY    . TUBAL LIGATION       reports that she is a non-smoker but has been exposed to tobacco smoke. She has never used smokeless tobacco. She reports that she does not drink alcohol or use drugs.  Allergies  Allergen Reactions  . Amoxicillin-Pot Clavulanate  Diarrhea  . Ibuprofen Other (See Comments)    Per MD she should no longer take, unsure why    Family History  Problem Relation Age of Onset  . Heart disease Father        No details  . Diabetes type II Other        Family HX  . Breast cancer Other    Prior to Admission medications   Medication Sig Start Date End Date Taking? Authorizing Provider  acetaminophen (TYLENOL) 500 MG tablet Take 1,000 mg by mouth every 6 (six) hours as needed for moderate pain or headache.    [provider]  albuterol (PROVENTIL HFA;VENTOLIN HFA) 108 (90 Base) MCG/ACT inhaler Inhale 1-2 puffs into the lungs every 6 (six) hours as needed for wheezing or shortness of breath. Patient taking differently: Inhale 2 puffs into the lungs every 6 (six) hours as needed for wheezing or shortness of breath.  02/10/15   Drenda Freeze, MD  aspirin EC 81 MG tablet Take 1 tablet (81 mg total) by mouth daily. 05/18/16   Minus Breeding, MD  azithromycin (ZITHROMAX) 250 MG tablet  06/02/17   [provider]  benzonatate (TESSALON) 200 MG capsule Take 1 capsule (200 mg total) by mouth 3 (three) times daily. 11/19/18   Tedd Sias, PA  Calcium-Phosphorus-Vitamin D (CALCIUM GUMMIES PO) Take 2 tablets by mouth daily.    [provider]  carvedilol (COREG) 25 MG tablet TAKE 1 TABLET BY MOUTH TWICE DAILY WITH A MEAL 07/11/18   Minus Breeding, MD  Cholecalciferol (VITAMIN D-3) 5000 units TABS Take 5,000 Units by mouth daily.    [provider]  ciclopirox (PENLAC) 8 % solution Apply topically at bedtime. Apply over nail and surrounding skin. Apply daily over previous coat. Remove weekly with polish remover. 06/17/17   Marzetta Board, DPM  ELDERBERRY PO Take 1 tablet by mouth 2 (two) times daily.    [provider]  fexofenadine (ALLEGRA) 180 MG tablet Take 180 mg by mouth daily.    [provider]  furosemide (LASIX) 40 MG tablet Take 1 tablet (40 mg total) by mouth  daily. Patient taking differently: Take 40 mg by mouth 2 (two) times daily.  06/14/16   Minus Breeding, MD  GAVILYTE-G 236 g solution  06/16/17   [provider]  glucose blood (BAYER CONTOUR NEXT TEST) test strip as directed. 05/11/16   [provider]  insulin aspart (NOVOLOG) 100 UNIT/ML injection Use approximately 200 units via insulin pump  DX E11.9   And Z96.41 03/29/16   [provider]  lidocaine (LIDODERM) 5 % Place 3 patches onto the skin daily. Remove & Discard patch within 12 hours or as directed by MD 11/18/14   Melvenia Beam, MD  lisinopril (PRINIVIL,ZESTRIL) 10 MG tablet Take 1 tablet (10 mg total) by mouth daily. PLEASE CONTACT OFFICE FOR ADDITIONAL REFILLS 12/19/15   Minus Breeding, MD  metFORMIN (GLUCOPHAGE) 1000 MG tablet Take 1,000 mg by mouth 2 (two) times daily with a meal.    [provider]  Multiple Vitamin (MULTIVITAMIN WITH MINERALS) TABS Take 1 tablet by mouth every morning.     [provider]  Multiple Vitamins-Minerals (EMERGEN-C IMMUNE PO) Take 1 packet by mouth 2 (two) times daily.    [provider]  Multiple Vitamins-Minerals (HAIR SKIN AND NAILS FORMULA) TABS Take 2 tablets by mouth daily.    [provider]  Multiple Vitamins-Minerals (PRESERVISION/LUTEIN PO) Take 2 tablets by mouth 2 (two) times daily.    [provider]  Phenylephrine-APAP-Guaifenesin [facility]) [phone] MG/20ML LIQD Take 2 capsules by mouth daily. 04/16/15   Wyatt Portela, MD  potassium chloride SA (K-DUR,KLOR-CON) 20 MEQ tablet Take 1 tablet (20 mEq total) by mouth daily. 06/14/16   Minus Breeding, MD  predniSONE (DELTASONE) 10 MG tablet Take 10 mg by mouth daily with breakfast.    [provider]  rosuvastatin (CRESTOR) 20 MG tablet Take 20 mg by mouth every evening. 12/13/14 06/14/17  [provider]    Physical Exam: Vitals:   01/13/19 0304 01/13/19 0400 01/13/19 0410 01/13/19 0413  BP: 96/67   (!) 90/52 (!) 90/52  Pulse: 95  92 98  Resp: (!) 30  (!) 26 20  Temp: 99.6 F (37.6 C) 98.7 F (37.1 C) 98.7 F (37.1 C) 98.7 F (37.1 C)  TempSrc: Oral Oral Oral Oral  SpO2: 100%  99% 99%  Weight:   (!) 178.2 kg (!) 178.2 kg  Height:   5\' 9"  (1.753 m) 5\' 9"  (1.753 m)    Constitutional: NAD, calm, comfortable Eyes: PERRL, lids and conjunctivae mildly injected ENMT: Mucous membranes are moist. Posterior pharynx clear of any exudate or lesions. Neck: normal, supple, no masses, no thyromegaly Respiratory: Mildly tachypneic in the low to mid 20s.  No accessory muscle use.  Good aeration with bilateral scattered crackles. Cardiovascular: Regular rate and rhythm, no murmurs / rubs / gallops. No extremity edema. 2+ pedal pulses. No carotid bruits.  Abdomen: Obese, soft, no tenderness, no masses palpated. No hepatosplenomegaly. Bowel sounds positive.  Musculoskeletal: no clubbing / cyanosis. Good ROM, no contractures. Normal muscle tone.  Skin: no rashes, lesions, ulcers on limited dermatological examination. Neurologic: CN 2-12 grossly intact. Sensation intact, DTR normal.  Generalized weakness. Psychiatric: Normal judgment and insight. Alert and oriented x 3. Normal mood.   Labs on Admission: I have personally reviewed following labs and imaging studies  CBC: Recent Labs  Lab 01/12/19 1838  WBC 8.7  NEUTROABS 6.9  HGB 12.7  HCT 38.6  MCV 76.9*  PLT [id number]   Basic Metabolic Panel: Recent Labs  Lab 01/12/19 1838  NA 138  K 4.0  CL 100  CO2 28  GLUCOSE 233*  BUN 22*  CREATININE 1.17*  CALCIUM 8.6*   GFR: Estimated Creatinine Clearance: 99.7 mL/min (A) (by C-G formula based on SCr of 1.17 mg/dL (H)). Liver Function Tests: Recent Labs  Lab 01/12/19 1838  AST 69*  ALT 37  ALKPHOS 65  BILITOT 0.6  PROT 6.8  ALBUMIN 3.0*   No results for input(s): LIPASE, AMYLASE in the last 168 hours. No results for input(s): AMMONIA in the last 168 hours. Coagulation Profile: No  results for input(s): INR, PROTIME in the last 168 hours. Cardiac Enzymes: No results for input(s): CKTOTAL, CKMB, CKMBINDEX, TROPONINI in the last 168 hours. BNP (last 3 results) No results for input(s): PROBNP in the last 8760 hours. HbA1C: No results for input(s): HGBA1C in the last 72 hours. CBG: Recent Labs  Lab 01/12/19 2109  GLUCAP 218*   Lipid Profile: Recent Labs    01/12/19 1838  TRIG 141   Thyroid Function Tests: No  results for input(s): TSH, T4TOTAL, FREET4, T3FREE, THYROIDAB in the last 72 hours. Anemia Panel: Recent Labs    01/12/19 1838  FERRITIN 473*   Urine analysis:    Component Value Date/Time   COLORURINE YELLOW 01/31/2013 1138   APPEARANCEUR CLEAR 01/31/2013 1138   LABSPEC 1.029 01/31/2013 1138   PHURINE 6.0 01/31/2013 1138   GLUCOSEU NEG 01/31/2013 1138   HGBUR NEG 01/31/2013 1138   BILIRUBINUR NEG 01/31/2013 1138   KETONESUR NEG 01/31/2013 1138   PROTEINUR NEG 01/31/2013 1138   UROBILINOGEN 0.2 01/31/2013 1138   NITRITE NEG 01/31/2013 1138   LEUKOCYTESUR NEG 01/31/2013 1138    Radiological Exams on Admission: DG Chest Port 1 View  Result Date: 01/12/2019 CLINICAL DATA:  Sob, fever, body aches and nausea, family member at home is COVID +//ac EXAM: PORTABLE CHEST 1 VIEW COMPARISON:  Chest radiograph 11/19/2018 FINDINGS: Stable cardiomediastinal contours status post median sternotomy. There are new bilateral diffuse patchy airspace opacities concerning for multifocal pneumonia. No pneumothorax or large pleural effusion. No acute finding in the visualized skeleton. IMPRESSION: Bilateral diffuse patchy airspace opacities concerning for multifocal pneumonia. Follow-up to resolution recommended. Electronically Signed   By: Audie Pinto M.D.   On: 01/12/2019 19:47    EKG: Independently reviewed.  Vent. rate 102 BPM PR interval * ms QRS duration 91 ms QT/QTc 358/467 ms P-R-T axes 37 -33 57 Sinus tachycardia. Ventricular premature  complex Left axis deviation Low voltage, precordial leads  Assessment/Plan Principal Problem:   Pneumonia due to COVID-19 virus Admit to progressive unit/inpatient. Continue supplemental oxygen. Continue scheduled/as needed bronchodilators. Continue dexamethasone. Continue remdesivir. Actemra 4 mg/kg x 1 dose. Prone positioning as tolerated. Incentive spirometry. Follow-up CBC, CMP and inflammatory markers  Active Problems:   Hypotension 2ry to decreased oral intake plus furosemide use. Received 1000 mL of NS bolus. Hold furosemide and carvedilol. Monitor blood pressure.    Insulin dependent type 2 diabetes mellitus (HCC) We will stop use of insulin pump. Begin COVID-19 glycemic protocol.    HLD (hyperlipidemia)   Grade I diastolic dysfunction Hold furosemide and carvedilol due to hypotension.    Hypertension  As above.   DVT prophylaxis: Lovenox SQ. Code Status: Full code. Family Communication:  Disposition Plan: Admit for COVID-19 pneumonia treatment. Consults called:  Admission status: Inpatient/progressive.   Reubin Milan MD [facility]  If 7PM-7AM, please contact night-coverage [email or web address]  01/13/2019, 7:12 AM   This document was prepared using Dragon voice recognition software and may contain some unintended transcription errors.

## 2019-01-13 NOTE — Progress Notes (Signed)
[facility] PROGRESS NOTE    Progress Note  JIOVANNA [name]  PVX:[id number] DOB: July 03, 1965 DOA: 01/12/2019 PCP: [facility], [facility]     Brief Narrative:   Kaitlin [name] is an 54 y.o. 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  In the ED she was found to be hypoxic with a chest x-ray with bilateral infiltrates.  Assessment/Plan:   Acute respiratory failure with hypoxia secondarily to Pneumonia due to COVID-19 virus She is currently requiring 12 L of oxygen to keep saturations greater than 95%. Empirically on IV remdesivir and steroids. Her inflammatory markers are significantly elevated. Try to keep the patient peripherally 16 hours a day if not prone out of bed to chair. Continue Lovenox for DVT prophylaxis.  Continue incentive spirometry and flutter valve.  Essential hypertension/hypertension: Likely due to decreased oral intake in the setting of furosemide use. Her diuretics and antihypertensive medications were held.  She received a liter of normal saline and her blood pressure stabilized.  Insulin-dependent diabetes mellitus type 2: Insulin pump was stopped she was started on long-acting insulin plus sliding scale.  HLD (hyperlipidemia): Continue statins.   DVT prophylaxis: lovenox Family Communication:none Disposition Plan/Barrier to D/C: unable to determine Code Status:  Code Status History    Date Active Date Inactive Code Status Order ID Comments User Context   05/25/2013 1214 05/25/2013 1810 Full Code [id number]  Troy Sine, MD Inpatient   01/17/2013 1908 01/24/2013 1657 Full Code [id number]  Gaye Pollack, MD Inpatient   01/12/2013 1349 01/17/2013 1908 Full Code [id number]  Gaye Pollack, MD  Inpatient   12/12/2012 0408 12/14/2012 1939 Full Code [id number]  Rise Patience, MD Inpatient   11/30/2012 1404 12/01/2012 1730 Full Code [id number]  Jodi Marble, MD Inpatient   11/06/2012 1347 11/10/2012 1658 Full Code [id number]  Gaye Pollack, MD Inpatient   Advance Care Planning Activity        IV Access:    Peripheral IV   Procedures and diagnostic studies:   DG Chest Port 1 View  Result Date: 01/12/2019 CLINICAL DATA:  Sob, fever, body aches and nausea, family member at home is COVID +//ac EXAM: PORTABLE CHEST 1 VIEW COMPARISON:  Chest radiograph 11/19/2018 FINDINGS: Stable cardiomediastinal contours status post median sternotomy. There are new bilateral diffuse patchy airspace opacities concerning for multifocal pneumonia. No pneumothorax or large pleural effusion. No acute finding in the visualized skeleton. IMPRESSION: Bilateral diffuse patchy airspace opacities concerning for multifocal pneumonia. Follow-up to resolution recommended. Electronically Signed   By: Audie Pinto M.D.   On: 01/12/2019 19:47     Medical Consultants:    None.  Anti-Infectives:   IV remdesivir  Subjective:    Kaitlin [name] she relates her breathing is unchanged compared to yesterday.  Objective:    Vitals:   01/13/19 0400 01/13/19 0410 01/13/19 0413 01/13/19 0729  BP:  (!) 90/52 (!) 90/52 137/89  Pulse:  92 98   Resp:  (!) 26 20   Temp: 98.7 F (37.1 C) 98.7 F (37.1 C) 98.7 F (37.1 C) 99.1 F (37.3 C)  TempSrc: Oral Oral Oral Oral  SpO2:  99% 99%   Weight:  (!) 178.2 kg (!) 178.2 kg   Height:  _0  (1.753 m) _1  (1.753 m)  SpO2: 99 % O2 Flow Rate (L/min): 12 L/min  No intake or output data in the 24 hours ending 01/13/19 0759 Filed Weights   01/13/19 0410 01/13/19 0413  Weight: (!) 178.2 kg (!) 178.2 kg    Exam: General exam: In no acute distress. Respiratory system: Good air movement and diffuse crackles bilaterally. Cardiovascular system: S1 & S2  heard, RRR. No JVD. Gastrointestinal system: Abdomen is nondistended, soft and nontender.  Central nervous system: Alert and oriented. No focal neurological deficits. Extremities: No pedal edema. Skin: No rashes, lesions or ulcers Psychiatry: Judgement and insight appear normal. Mood & affect appropriate.    Data Reviewed:    Labs: Basic Metabolic Panel: Recent Labs  Lab 01/12/19 1838  NA 138  K 4.0  CL 100  CO2 28  GLUCOSE 233*  BUN 22*  CREATININE 1.17*  CALCIUM 8.6*   GFR Estimated Creatinine Clearance: 99.7 mL/min (A) (by C-G formula based on SCr of 1.17 mg/dL (H)). Liver Function Tests: Recent Labs  Lab 01/12/19 1838  AST 69*  ALT 37  ALKPHOS 65  BILITOT 0.6  PROT 6.8  ALBUMIN 3.0*   No results for input(s): LIPASE, AMYLASE in the last 168 hours. No results for input(s): AMMONIA in the last 168 hours. Coagulation profile No results for input(s): INR, PROTIME in the last 168 hours. COVID-19 Labs  Recent Labs    01/12/19 1838  DDIMER 1.54*  FERRITIN 473*  LDH 397*  CRP 19.5*    Lab Results  Component Value Date   SARSCOV2NAA NEGATIVE 11/19/2018    CBC: Recent Labs  Lab 01/12/19 1838  WBC 8.7  NEUTROABS 6.9  HGB 12.7  HCT 38.6  MCV 76.9*  PLT 203   Cardiac Enzymes: No results for input(s): CKTOTAL, CKMB, CKMBINDEX, TROPONINI in the last 168 hours. BNP (last 3 results) No results for input(s): PROBNP in the last 8760 hours. CBG: Recent Labs  Lab 01/12/19 2109 01/13/19 0733  GLUCAP 218* 154*   D-Dimer: Recent Labs    01/12/19 1838  DDIMER 1.54*   Hgb A1c: No results for input(s): HGBA1C in the last 72 hours. Lipid Profile: Recent Labs    01/12/19 1838  TRIG 141   Thyroid function studies: No results for input(s): TSH, T4TOTAL, T3FREE, THYROIDAB in the last 72 hours.  Invalid input(s): FREET3 Anemia work up: Recent Labs    01/12/19 1838  FERRITIN 473*   Sepsis Labs: Recent Labs  Lab 01/12/19 1838  PROCALCITON  <0.10  WBC 8.7  LATICACIDVEN 1.1   Microbiology Recent Results (from the past 240 hour(s))  SARS Coronavirus 2 Ag (30 min TAT) - Nasal Swab (BD Veritor Kit)     Status: Abnormal   Collection Time: 01/12/19  6:38 PM   Specimen: Nasal Swab (BD Veritor Kit)  Result Value Ref Range Status   SARS Coronavirus 2 Ag POSITIVE (A) NEGATIVE Final    Comment: RESULT CALLED TO, READ BACK BY AND VERIFIED WITH: Marsa Aris RN AT 1942 ON 01/12/19 BY [email or web address] (NOTE) SARS-CoV-2 antigen PRESENT. Positive results indicate the presence of viral antigens, but clinical correlation with patient history and other diagnostic information is necessary to determine patient infection status.  Positive results do not rule out bacterial infection or co-infection  with other viruses. False positive results are rare but can occur, and confirmatory RT-PCR testing may be appropriate in some circumstances. The expected result is Negative. Fact Sheet for Patients: PodPark.tn Fact Sheet for Providers: GiftContent.is  This test is not yet approved  or cleared by the [place] and  has been authorized for detection and/or diagnosis of SARS-CoV-2 by FDA under an Emergency Use Authorization (EUA).  This EUA will remain in effect (meaning this test can be used) for the duration of  the COVID-19  declaration under Section 564(b)(1) of the Act, 21 U.S.C. section 360bbb-3(b)(1), unless the authorization is terminated or revoked sooner. Performed at [facility], [address] [place], [place] [address]      Medications:   . vitamin C  500 mg Oral Daily  . dexamethasone  6 mg Oral Q12H  . enoxaparin (LOVENOX) injection  85 mg Subcutaneous Q24H  . insulin aspart  0-20 Units Subcutaneous Q4H  . insulin detemir  0.15 Units/kg Subcutaneous BID  . Ipratropium-Albuterol  2 puff Inhalation Q6H  . zinc sulfate  220 mg Oral Daily   Continuous  Infusions: . [START ON 01/14/2019] remdesivir 100 mg in NS 100 mL    . tocilizumab (ACTEMRA) IV       LOS: 1 day   Charlynne Cousins  [facility]  01/13/2019, 7:59 AM

## 2019-01-14 LAB — CBC WITH DIFFERENTIAL/PLATELET
Abs Immature Granulocytes: 0.04 10*3/uL (ref 0.00–0.07)
Basophils Absolute: 0 10*3/uL (ref 0.0–0.1)
Basophils Relative: 0 %
Eosinophils Absolute: 0 10*3/uL (ref 0.0–0.5)
Eosinophils Relative: 0 %
HCT: 38.9 % (ref 36.0–46.0)
Hemoglobin: 12.7 g/dL (ref 12.0–15.0)
Immature Granulocytes: 1 %
Lymphocytes Relative: 20 %
Lymphs Abs: 1.5 10*3/uL (ref 0.7–4.0)
MCH: 25 pg — ABNORMAL LOW (ref 26.0–34.0)
MCHC: 32.6 g/dL (ref 30.0–36.0)
MCV: 76.6 fL — ABNORMAL LOW (ref 80.0–100.0)
Monocytes Absolute: 0.4 10*3/uL (ref 0.1–1.0)
Monocytes Relative: 5 %
Neutro Abs: 5.6 10*3/uL (ref 1.7–7.7)
Neutrophils Relative %: 74 %
Platelets: 254 10*3/uL (ref 150–400)
RBC: 5.08 MIL/uL (ref 3.87–5.11)
RDW: 17.2 % — ABNORMAL HIGH (ref 11.5–15.5)
WBC: 7.5 10*3/uL (ref 4.0–10.5)
nRBC: 0 % (ref 0.0–0.2)

## 2019-01-14 LAB — GLUCOSE, CAPILLARY
Glucose-Capillary: 223 mg/dL — ABNORMAL HIGH (ref 70–99)
Glucose-Capillary: 244 mg/dL — ABNORMAL HIGH (ref 70–99)
Glucose-Capillary: 312 mg/dL — ABNORMAL HIGH (ref 70–99)
Glucose-Capillary: 332 mg/dL — ABNORMAL HIGH (ref 70–99)

## 2019-01-14 LAB — COMPREHENSIVE METABOLIC PANEL
ALT: 37 U/L (ref 0–44)
AST: 47 U/L — ABNORMAL HIGH (ref 15–41)
Albumin: 2.9 g/dL — ABNORMAL LOW (ref 3.5–5.0)
Alkaline Phosphatase: 61 U/L (ref 38–126)
Anion gap: 10 (ref 5–15)
BUN: 23 mg/dL — ABNORMAL HIGH (ref 6–20)
CO2: 27 mmol/L (ref 22–32)
Calcium: 8.8 mg/dL — ABNORMAL LOW (ref 8.9–10.3)
Chloride: 103 mmol/L (ref 98–111)
Creatinine, Ser: 0.89 mg/dL (ref 0.44–1.00)
GFR calc Af Amer: >60 mL/min (ref >60)
GFR calc non Af Amer: >60 mL/min (ref >60)
Glucose, Bld: 305 mg/dL — ABNORMAL HIGH (ref 70–99)
Potassium: 4.5 mmol/L (ref 3.5–5.1)
Sodium: 140 mmol/L (ref 135–145)
Total Bilirubin: 0.7 mg/dL (ref 0.3–1.2)
Total Protein: 6.6 g/dL (ref 6.5–8.1)

## 2019-01-14 LAB — D-DIMER, QUANTITATIVE: D-Dimer, Quant: 1.23 ug/mL-FEU — ABNORMAL HIGH (ref 0.00–0.50)

## 2019-01-14 LAB — C-REACTIVE PROTEIN: CRP: 15.8 mg/dL — ABNORMAL HIGH (ref <1.0)

## 2019-01-14 MED ORDER — INSULIN DETEMIR 100 UNIT/ML ~~LOC~~ SOLN
65.0000 [IU] | Freq: Two times a day (BID) | SUBCUTANEOUS | Status: DC
  Administered 2019-01-14 – 2019-01-16 (×4): 65 [IU] via SUBCUTANEOUS
  Filled 2019-01-14 (×7): qty 0.65

## 2019-01-14 MED ORDER — INSULIN DETEMIR 100 UNIT/ML ~~LOC~~ SOLN: 70.0000 [IU] | Freq: Two times a day (BID) | SUBCUTANEOUS | Status: DC

## 2019-01-14 MED ORDER — INSULIN ASPART 100 UNIT/ML ~~LOC~~ SOLN
8.0000 [IU] | Freq: Three times a day (TID) | SUBCUTANEOUS | Status: DC
  Administered 2019-01-14 (×2): 8 [IU] via SUBCUTANEOUS

## 2019-01-14 MED ORDER — INSULIN ASPART 100 UNIT/ML ~~LOC~~ SOLN
15.0000 [IU] | Freq: Three times a day (TID) | SUBCUTANEOUS | Status: DC
  Administered 2019-01-15 (×2): 15 [IU] via SUBCUTANEOUS

## 2019-01-14 MED ORDER — INSULIN DETEMIR 100 UNIT/ML ~~LOC~~ SOLN
50.0000 [IU] | Freq: Two times a day (BID) | SUBCUTANEOUS | Status: DC
  Administered 2019-01-14: 50 [IU] via SUBCUTANEOUS
  Filled 2019-01-14: qty 0.5

## 2019-01-14 MED ORDER — INSULIN DETEMIR 100 UNIT/ML ~~LOC~~ SOLN
45.0000 [IU] | Freq: Two times a day (BID) | SUBCUTANEOUS | Status: DC
  Filled 2019-01-14: qty 0.45

## 2019-01-14 MED ORDER — INSULIN DETEMIR 100 UNIT/ML ~~LOC~~ SOLN
60.0000 [IU] | Freq: Two times a day (BID) | SUBCUTANEOUS | Status: DC
  Filled 2019-01-14: qty 0.6

## 2019-01-14 NOTE — Progress Notes (Signed)
[facility] PROGRESS NOTE    Progress Note  Kaitlin [name]  ZOX:[id number] DOB: [birth year] DOA: 01/12/2019 PCP: Olga Coaster, [facility]     Brief Narrative:   Kaitlin [name] is an 54 y.o. 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  In the ED she was found to be hypoxic with a chest x-ray with bilateral infiltrates.  Assessment/Plan:   Acute respiratory failure with hypoxia secondarily to Pneumonia due to COVID-19 virus She is currently requiring 6 L of oxygen to keep saturations greater than 92%.  She is coming off her oxygen very nicely. Continue IV remdesivir and steroids. Inflammatory markers started to improve. Try to keep the patient peripherally 16 hours a day, if not prone out of bed to chair, continue incentive spirometry and flutter valve. Continue Lovenox for DVT prophylaxis.  Essential hypertension/hypotension: Hypotension is likely due to decreased oral intake in the setting of diuretic use. Antihypertensive medications were held on admission she was given a liter of normal saline her blood pressure improved. We will continue to hold antihypertensive medication.  Insulin-dependent diabetes mellitus type 2: 7.1, increase long-acting insulin continue sliding scale resistance. Her blood glucose still significantly elevated.  HLD (hyperlipidemia): Continue statins.   DVT prophylaxis: lovenox Family Communication:none Disposition Plan/Barrier to D/C: unable to determine Code Status:  Code Status History    Date Active Date Inactive Code Status Order ID Comments User Context   05/25/2013 1214 05/25/2013 1810 Full Code [id number]  Troy Sine, MD Inpatient   01/17/2013 1908 01/24/2013 1657 Full Code  [id number]  Gaye Pollack, MD Inpatient   01/12/2013 1349 01/17/2013 1908 Full Code [id number]  Gaye Pollack, MD Inpatient   12/12/2012 0408 12/14/2012 1939 Full Code [id number]  Rise Patience, MD Inpatient   11/30/2012 1404 12/01/2012 1730 Full Code [id number]  Jodi Marble, MD Inpatient   11/06/2012 1347 11/10/2012 1658 Full Code [id number]  Gaye Pollack, MD Inpatient   Advance Care Planning Activity        IV Access:    Peripheral IV   Procedures and diagnostic studies:   DG Chest Port 1 View  Result Date: 01/12/2019 CLINICAL DATA:  Sob, fever, body aches and nausea, family member at home is COVID +//ac EXAM: PORTABLE CHEST 1 VIEW COMPARISON:  Chest radiograph 11/19/2018 FINDINGS: Stable cardiomediastinal contours status post median sternotomy. There are new bilateral diffuse patchy airspace opacities concerning for multifocal pneumonia. No pneumothorax or large pleural effusion. No acute finding in the visualized skeleton. IMPRESSION: Bilateral diffuse patchy airspace opacities concerning for multifocal pneumonia. Follow-up to resolution recommended. Electronically Signed   By: Audie Pinto M.D.   On: 01/12/2019 19:47     Medical Consultants:    None.  Anti-Infectives:   IV remdesivir  Subjective:    Kaitlin [name] was crying this morning as she thinks she is not improving.   Objective:    Vitals:   01/13/19 1900 01/13/19 2000 01/14/19 0400 01/14/19 0725  BP: 125/88  134/89 124/80  Pulse: 99 98 92 97  Resp: (!) 22  16 (!) 25  Temp: 98.4 F (36.9 C)  98.4 F (36.9 C) 97.9 F (36.6 C)  TempSrc: Oral  Oral Oral  SpO2: 91%   90%  Weight:      Height:  SpO2: 90 % O2 Flow Rate (L/min): 6 L/min   Intake/Output Summary (Last 24 hours) at 01/14/2019 0800 Last data filed at 01/14/2019 0200 Gross per 24 hour  Intake 1040 ml  Output --  Net 1040 ml   Filed Weights   01/13/19 0410 01/13/19 0413  Weight: (!) 178.2 kg (!) 178.2 kg     Exam: General exam: In no acute distress. Respiratory system: Good air movement and diffuse crackles bilaterally. Cardiovascular system: S1 & S2 heard, RRR. No JVD. Gastrointestinal system: Abdomen is nondistended, soft and nontender.  Central nervous system: Alert and oriented. No focal neurological deficits. Extremities: No pedal edema. Skin: No rashes, lesions or ulcers Psychiatry: Judgement and insight appear normal. Mood & affect appropriate.    Data Reviewed:    Labs: Basic Metabolic Panel: Recent Labs  Lab 01/12/19 1838 01/13/19 0823 01/14/19 0147  NA 138 141 140  K 4.0 4.1 4.5  CL 100 104 103  CO2 '28 26 27  ' GLUCOSE 233* 149* 305*  BUN 22* 17 23*  CREATININE 1.17* 0.76 0.89  CALCIUM 8.6* 8.4* 8.8*  MG  --  2.2  --    GFR Estimated Creatinine Clearance: 131 mL/min (by C-G formula based on SCr of 0.89 mg/dL). Liver Function Tests: Recent Labs  Lab 01/12/19 1838 01/13/19 0823 01/14/19 0147  AST 69* 52* 47*  ALT 37 33 37  ALKPHOS 65 57 61  BILITOT 0.6 0.6 0.7  PROT 6.8 6.4* 6.6  ALBUMIN 3.0* 2.6* 2.9*   No results for input(s): LIPASE, AMYLASE in the last 168 hours. No results for input(s): AMMONIA in the last 168 hours. Coagulation profile No results for input(s): INR, PROTIME in the last 168 hours. COVID-19 Labs  Recent Labs    01/12/19 1838 01/13/19 0823 01/14/19 0147 01/14/19 0500  DDIMER 1.54* 1.29* 1.23*  --   FERRITIN 473*  --   --   --   LDH 397*  --   --   --   CRP 19.5* 19.3*  --  15.8*    Lab Results  Component Value Date   SARSCOV2NAA NEGATIVE 11/19/2018    CBC: Recent Labs  Lab 01/12/19 1838 01/13/19 0823 01/14/19 0147  WBC 8.7 6.1 7.5  NEUTROABS 6.9 4.6 5.6  HGB 12.7 11.6* 12.7  HCT 38.6 35.4* 38.9  MCV 76.9* 77.1* 76.6*  PLT 203 207 254   Cardiac Enzymes: No results for input(s): CKTOTAL, CKMB, CKMBINDEX, TROPONINI in the last 168 hours. BNP (last 3 results) No results for input(s): PROBNP in the last 8760  hours. CBG: Recent Labs  Lab 01/13/19 1132 01/13/19 1628 01/13/19 2356 01/14/19 0413 01/14/19 0729  GLUCAP 245* 353* 332* 244* 223*   D-Dimer: Recent Labs    01/13/19 0823 01/14/19 0147  DDIMER 1.29* 1.23*   Hgb A1c: Recent Labs    01/13/19 0823  HGBA1C 7.1*   Lipid Profile: Recent Labs    01/12/19 1838  TRIG 141   Thyroid function studies: No results for input(s): TSH, T4TOTAL, T3FREE, THYROIDAB in the last 72 hours.  Invalid input(s): FREET3 Anemia work up: Recent Labs    01/12/19 1838  FERRITIN 473*   Sepsis Labs: Recent Labs  Lab 01/12/19 1838 01/13/19 0823 01/14/19 0147  PROCALCITON <0.10 0.10  --   WBC 8.7 6.1 7.5  LATICACIDVEN 1.1  --   --    Microbiology Recent Results (from the past 240 hour(s))  Blood Culture (routine x 2)     Status: None (Preliminary result)  Collection Time: 01/12/19  6:38 PM   Specimen: BLOOD  Result Value Ref Range Status   Specimen Description   Final    BLOOD RIGHT ANTECUBITAL Performed at [facility], [address] [place], [place] [address]    Special Requests   Final    BOTTLES DRAWN AEROBIC AND ANAEROBIC Blood Culture adequate volume Performed at [facility], [address] [place], [place] [address]    Culture   Final    NO GROWTH < 12 HOURS Performed at [facility], [place] [address] [place], [place] [address]    Report Status PENDING  Incomplete  SARS Coronavirus 2 Ag (30 min TAT) - Nasal Swab (BD Veritor Kit)     Status: Abnormal   Collection Time: 01/12/19  6:38 PM   Specimen: Nasal Swab (BD Veritor Kit)  Result Value Ref Range Status   SARS Coronavirus 2 Ag POSITIVE (A) NEGATIVE Final    Comment: RESULT CALLED TO, READ BACK BY AND VERIFIED WITH: Marsa Aris RN AT 1942 ON 01/12/19 BY I.[email or web address] (NOTE) SARS-CoV-2 antigen PRESENT. Positive results indicate the presence of viral antigens, but clinical correlation with patient history and other diagnostic information is  necessary to determine patient infection status.  Positive results do not rule out bacterial infection or co-infection  with other viruses. False positive results are rare but can occur, and confirmatory RT-PCR testing may be appropriate in some circumstances. The expected result is Negative. Fact Sheet for Patients: PodPark.tn Fact Sheet for Providers: GiftContent.is  This test is not yet approved or cleared by the [place] FDA and  has been authorized for detection and/or diagnosis of SARS-CoV-2 by FDA under an Emergency Use Authorization (EUA).  This EUA will remain in effect (meaning this test can be used) for the duration of  the COVID-19  declaration under Section 564(b)(1) of the Act, 21 U.S.C. section 360bbb-3(b)(1), unless the authorization is terminated or revoked sooner. Performed at [facility], [address] [place], [place] [address]   Blood Culture (routine x 2)     Status: None (Preliminary result)   Collection Time: 01/12/19  7:36 PM   Specimen: BLOOD  Result Value Ref Range Status   Specimen Description   Final    BLOOD LEFT ANTECUBITAL Performed at [facility], [address] [place], [place] [address]    Special Requests   Final    BOTTLES DRAWN AEROBIC AND ANAEROBIC Blood Culture adequate volume Performed at [facility], [address] [place], [place] [address]    Culture   Final    NO GROWTH < 12 HOURS Performed at [facility], [place] [address] [place], [place] [address]    Report Status PENDING  Incomplete     Medications:   . vitamin C  500 mg Oral Daily  . dexamethasone  6 mg Oral Daily  . enoxaparin (LOVENOX) injection  85 mg Subcutaneous Q24H  . insulin aspart  0-15 Units Subcutaneous TID WC  . insulin aspart  0-5 Units Subcutaneous QHS  . insulin aspart  4 Units Subcutaneous TID WC  . insulin detemir  40 Units Subcutaneous BID  .  Ipratropium-Albuterol  2 puff Inhalation Q6H  . zinc sulfate  220 mg Oral Daily   Continuous Infusions: . remdesivir 100 mg in NS 100 mL       LOS: 2 days   Charlynne Cousins  [facility]  01/14/2019, 8:00 AM

## 2019-01-14 NOTE — Plan of Care (Addendum)
  Problem: Education: Goal: Knowledge of General Education information will improve Description: Including pain rating scale, medication(s)/side effects and non-pharmacologic comfort measures 01/14/2019 0726 by Orvan Falconer, RN Outcome: Progressing 01/14/2019 0726 by Orvan Falconer, RN Outcome: Progressing   Problem: Health Behavior/Discharge Planning: Goal: Ability to manage health-related needs will improve 01/14/2019 0726 by Orvan Falconer, RN Outcome: Progressing 01/14/2019 0726 by Orvan Falconer, RN Outcome: Progressing   Problem: Clinical Measurements: Goal: Ability to maintain clinical measurements within normal limits will improve 01/14/2019 0726 by Orvan Falconer, RN Outcome: Progressing 01/14/2019 0726 by Orvan Falconer, RN Outcome: Progressing Goal: Will remain free from infection 01/14/2019 0726 by Orvan Falconer, RN Outcome: Progressing 01/14/2019 0726 by Orvan Falconer, RN Outcome: Progressing Goal: Diagnostic test results will improve 01/14/2019 0726 by Orvan Falconer, RN Outcome: Progressing 01/14/2019 0726 by Orvan Falconer, RN Outcome: Progressing Goal: Respiratory complications will improve 01/14/2019 0726 by Orvan Falconer, RN Outcome: Progressing 01/14/2019 0726 by Orvan Falconer, RN Outcome: Progressing Goal: Cardiovascular complication will be avoided 01/14/2019 0726 by Orvan Falconer, RN Outcome: Progressing 01/14/2019 0726 by Orvan Falconer, RN Outcome: Progressing   Problem: Activity: Goal: Risk for activity intolerance will decrease 01/14/2019 0726 by Orvan Falconer, RN Outcome: Progressing 01/14/2019 0726 by Orvan Falconer, RN Outcome: Progressing   Problem: Nutrition: Goal: Adequate nutrition will be maintained 01/14/2019 0726 by Orvan Falconer, RN Outcome: Progressing 01/14/2019 0726 by Orvan Falconer, RN Outcome: Progressing   Problem: Coping: Goal: Level of anxiety will decrease 01/14/2019  0726 by Orvan Falconer, RN Outcome: Progressing 01/14/2019 0726 by Orvan Falconer, RN Outcome: Progressing   Problem: Elimination: Goal: Will not experience complications related to bowel motility 01/14/2019 0726 by Orvan Falconer, RN Outcome: Progressing 01/14/2019 0726 by Orvan Falconer, RN Outcome: Progressing Goal: Will not experience complications related to urinary retention 01/14/2019 0726 by Orvan Falconer, RN Outcome: Progressing 01/14/2019 0726 by Orvan Falconer, RN Outcome: Progressing   Problem: Pain Managment: Goal: General experience of comfort will improve 01/14/2019 0726 by Orvan Falconer, RN Outcome: Progressing 01/14/2019 0726 by Orvan Falconer, RN Outcome: Progressing   Problem: Safety: Goal: Ability to remain free from injury will improve 01/14/2019 0726 by Orvan Falconer, RN Outcome: Progressing 01/14/2019 0726 by Orvan Falconer, RN Outcome: Progressing   Problem: Skin Integrity: Goal: Risk for impaired skin integrity will decrease 01/14/2019 0726 by Orvan Falconer, RN Outcome: Progressing 01/14/2019 0726 by Orvan Falconer, RN Outcome: Progressing

## 2019-01-15 ENCOUNTER — Inpatient Hospital Stay (HOSPITAL_COMMUNITY): Payer: Medicaid Other

## 2019-01-15 ENCOUNTER — Inpatient Hospital Stay (HOSPITAL_COMMUNITY): Payer: Medicaid Other | Admitting: Certified Registered"

## 2019-01-15 LAB — POCT I-STAT 7, (LYTES, BLD GAS, ICA,H+H)
Acid-Base Excess: 6 mmol/L — ABNORMAL HIGH (ref 0.0–2.0)
Acid-Base Excess: 6 mmol/L — ABNORMAL HIGH (ref 0.0–2.0)
Bicarbonate: 32.2 mmol/L — ABNORMAL HIGH (ref 20.0–28.0)
Bicarbonate: 33.8 mmol/L — ABNORMAL HIGH (ref 20.0–28.0)
Calcium, Ion: 1.21 mmol/L (ref 1.15–1.40)
Calcium, Ion: 1.18 mmol/L (ref 1.15–1.40)
HCT: 44 % (ref 36.0–46.0)
HCT: 44 % (ref 36.0–46.0)
Hemoglobin: 15 g/dL (ref 12.0–15.0)
Hemoglobin: 15 g/dL (ref 12.0–15.0)
O2 Saturation: 75 %
O2 Saturation: 67 %
Patient temperature: 98.7
Potassium: 4.5 mmol/L (ref 3.5–5.1)
Potassium: 4.5 mmol/L (ref 3.5–5.1)
Sodium: 135 mmol/L (ref 135–145)
Sodium: 138 mmol/L (ref 135–145)
TCO2: 34 mmol/L — ABNORMAL HIGH (ref 22–32)
TCO2: 36 mmol/L — ABNORMAL HIGH (ref 22–32)
pCO2 arterial: 51.8 mmHg — ABNORMAL HIGH (ref 32.0–48.0)
pCO2 arterial: 61.1 mmHg — ABNORMAL HIGH (ref 32.0–48.0)
pH, Arterial: 7.401 (ref 7.350–7.450)
pH, Arterial: 7.351 (ref 7.350–7.450)
pO2, Arterial: 41 mmHg — ABNORMAL LOW (ref 83.0–108.0)
pO2, Arterial: 38 mmHg — CL (ref 83.0–108.0)

## 2019-01-15 LAB — GLUCOSE, CAPILLARY
Glucose-Capillary: 177 mg/dL — ABNORMAL HIGH (ref 70–99)
Glucose-Capillary: 403 mg/dL — ABNORMAL HIGH (ref 70–99)
Glucose-Capillary: 228 mg/dL — ABNORMAL HIGH (ref 70–99)
Glucose-Capillary: 366 mg/dL — ABNORMAL HIGH (ref 70–99)
Glucose-Capillary: 218 mg/dL — ABNORMAL HIGH (ref 70–99)
Glucose-Capillary: 196 mg/dL — ABNORMAL HIGH (ref 70–99)
Glucose-Capillary: 284 mg/dL — ABNORMAL HIGH (ref 70–99)
Glucose-Capillary: 292 mg/dL — ABNORMAL HIGH (ref 70–99)
Glucose-Capillary: 308 mg/dL — ABNORMAL HIGH (ref 70–99)

## 2019-01-15 LAB — CBC WITH DIFFERENTIAL/PLATELET
Abs Immature Granulocytes: 0.03 10*3/uL (ref 0.00–0.07)
Basophils Absolute: 0 10*3/uL (ref 0.0–0.1)
Basophils Relative: 0 %
Eosinophils Absolute: 0 10*3/uL (ref 0.0–0.5)
Eosinophils Relative: 0 %
HCT: 41.6 % (ref 36.0–46.0)
Hemoglobin: 13.6 g/dL (ref 12.0–15.0)
Immature Granulocytes: 0 %
Lymphocytes Relative: 31 %
Lymphs Abs: 2.3 10*3/uL (ref 0.7–4.0)
MCH: 25.2 pg — ABNORMAL LOW (ref 26.0–34.0)
MCHC: 32.7 g/dL (ref 30.0–36.0)
MCV: 77 fL — ABNORMAL LOW (ref 80.0–100.0)
Monocytes Absolute: 0.4 10*3/uL (ref 0.1–1.0)
Monocytes Relative: 5 %
Neutro Abs: 4.8 10*3/uL (ref 1.7–7.7)
Neutrophils Relative %: 64 %
Platelets: 305 10*3/uL (ref 150–400)
RBC: 5.4 MIL/uL — ABNORMAL HIGH (ref 3.87–5.11)
RDW: 17.3 % — ABNORMAL HIGH (ref 11.5–15.5)
WBC: 7.5 10*3/uL (ref 4.0–10.5)
nRBC: 0 % (ref 0.0–0.2)

## 2019-01-15 LAB — COMPREHENSIVE METABOLIC PANEL
ALT: 38 U/L (ref 0–44)
AST: 43 U/L — ABNORMAL HIGH (ref 15–41)
Albumin: 2.7 g/dL — ABNORMAL LOW (ref 3.5–5.0)
Alkaline Phosphatase: 64 U/L (ref 38–126)
Anion gap: 9 (ref 5–15)
BUN: 21 mg/dL — ABNORMAL HIGH (ref 6–20)
CO2: 29 mmol/L (ref 22–32)
Calcium: 8.9 mg/dL (ref 8.9–10.3)
Chloride: 103 mmol/L (ref 98–111)
Creatinine, Ser: 0.86 mg/dL (ref 0.44–1.00)
GFR calc Af Amer: >60 mL/min (ref >60)
GFR calc non Af Amer: >60 mL/min (ref >60)
Glucose, Bld: 235 mg/dL — ABNORMAL HIGH (ref 70–99)
Potassium: 4.6 mmol/L (ref 3.5–5.1)
Sodium: 141 mmol/L (ref 135–145)
Total Bilirubin: 0.4 mg/dL (ref 0.3–1.2)
Total Protein: 6.7 g/dL (ref 6.5–8.1)

## 2019-01-15 LAB — PROCALCITONIN: Procalcitonin: <0.1 ng/mL

## 2019-01-15 LAB — C-REACTIVE PROTEIN: CRP: 8.2 mg/dL — ABNORMAL HIGH (ref <1.0)

## 2019-01-15 LAB — D-DIMER, QUANTITATIVE: D-Dimer, Quant: 1.6 ug/mL-FEU — ABNORMAL HIGH (ref 0.00–0.50)

## 2019-01-15 MED ORDER — VANCOMYCIN HCL IN DEXTROSE 1-5 GM/200ML-% IV SOLN
1000.0000 mg | Freq: Once | INTRAVENOUS | Status: AC
  Administered 2019-01-15: 1000 mg via INTRAVENOUS
  Filled 2019-01-15: qty 200

## 2019-01-15 MED ORDER — FAMOTIDINE IN NACL 20-0.9 MG/50ML-% IV SOLN
20.0000 mg | Freq: Two times a day (BID) | INTRAVENOUS | Status: DC
  Administered 2019-01-15: 20 mg via INTRAVENOUS
  Filled 2019-01-15 (×2): qty 50

## 2019-01-15 MED ORDER — CARVEDILOL 25 MG PO TABS
25.0000 mg | ORAL_TABLET | Freq: Two times a day (BID) | ORAL | Status: DC
  Administered 2019-01-15: 25 mg via ORAL
  Filled 2019-01-15: qty 2
  Filled 2019-01-15 (×2): qty 1

## 2019-01-15 MED ORDER — FENTANYL CITRATE (PF) 100 MCG/2ML IJ SOLN
50.0000 ug | INTRAMUSCULAR | Status: DC | PRN
  Administered 2019-01-15: 50 ug via INTRAVENOUS

## 2019-01-15 MED ORDER — VANCOMYCIN HCL 10 G IV SOLR
2500.0000 mg | Freq: Once | INTRAVENOUS | Status: DC
  Administered 2019-01-15: 1500 mg via INTRAVENOUS
  Filled 2019-01-15: qty 2000

## 2019-01-15 MED ORDER — FUROSEMIDE 40 MG PO TABS
40.0000 mg | ORAL_TABLET | Freq: Two times a day (BID) | ORAL | Status: DC
  Administered 2019-01-15: 40 mg via ORAL
  Filled 2019-01-15: qty 2

## 2019-01-15 MED ORDER — PROPOFOL 1000 MG/100ML IV EMUL
INTRAVENOUS | Status: AC
  Administered 2019-01-15: 10 ug/kg/min via INTRAVENOUS
  Filled 2019-01-15: qty 100

## 2019-01-15 MED ORDER — PANTOPRAZOLE SODIUM 40 MG PO TBEC
40.0000 mg | DELAYED_RELEASE_TABLET | Freq: Two times a day (BID) | ORAL | Status: DC
  Administered 2019-01-15: 40 mg via ORAL
  Filled 2019-01-15: qty 1

## 2019-01-15 MED ORDER — VITAMIN D3 25 MCG PO TABS
1000.0000 [IU] | ORAL_TABLET | Freq: Every day | ORAL | Status: DC
  Administered 2019-01-15: 16:00:00 1000 [IU] via ORAL
  Filled 2019-01-15 (×2): qty 1

## 2019-01-15 MED ORDER — ROCURONIUM BROMIDE 10 MG/ML (PF) SYRINGE
PREFILLED_SYRINGE | INTRAVENOUS | Status: DC | PRN
  Administered 2019-01-15: 100 mg via INTRAVENOUS

## 2019-01-15 MED ORDER — CHLORHEXIDINE GLUCONATE CLOTH 2 % EX PADS
6.0000 | MEDICATED_PAD | Freq: Every day | CUTANEOUS | Status: DC
  Administered 2019-01-15 – 2019-02-02 (×19): 6 via TOPICAL

## 2019-01-15 MED ORDER — ASPIRIN EC 81 MG PO TBEC
81.0000 mg | DELAYED_RELEASE_TABLET | Freq: Every day | ORAL | Status: DC
  Administered 2019-01-15: 81 mg via ORAL
  Filled 2019-01-15: qty 1

## 2019-01-15 MED ORDER — ALBUTEROL SULFATE HFA 108 (90 BASE) MCG/ACT IN AERS
2.0000 | INHALATION_SPRAY | Freq: Four times a day (QID) | RESPIRATORY_TRACT | Status: DC | PRN
  Administered 2019-02-03: 2 via RESPIRATORY_TRACT
  Filled 2019-01-15 (×2): qty 6.7

## 2019-01-15 MED ORDER — VANCOMYCIN HCL 1500 MG/300ML IV SOLN
1500.0000 mg | Freq: Two times a day (BID) | INTRAVENOUS | Status: DC
  Administered 2019-01-16 – 2019-01-18 (×5): 1500 mg via INTRAVENOUS
  Filled 2019-01-15 (×7): qty 300

## 2019-01-15 MED ORDER — SODIUM CHLORIDE 0.9 % IV SOLN
2.0000 g | Freq: Three times a day (TID) | INTRAVENOUS | Status: DC
  Administered 2019-01-16 – 2019-01-18 (×8): 2 g via INTRAVENOUS
  Filled 2019-01-15 (×8): qty 2

## 2019-01-15 MED ORDER — BENZONATATE 100 MG PO CAPS
200.0000 mg | ORAL_CAPSULE | Freq: Three times a day (TID) | ORAL | Status: DC
  Administered 2019-01-15: 200 mg via ORAL
  Filled 2019-01-15: qty 2

## 2019-01-15 MED ORDER — ETOMIDATE 2 MG/ML IV SOLN
INTRAVENOUS | Status: DC | PRN
  Administered 2019-01-15: 16 mg via INTRAVENOUS

## 2019-01-15 MED ORDER — ROSUVASTATIN CALCIUM 20 MG PO TABS
20.0000 mg | ORAL_TABLET | Freq: Every evening | ORAL | Status: DC
  Filled 2019-01-15: qty 1

## 2019-01-15 MED ORDER — ETOMIDATE 2 MG/ML IV SOLN
INTRAVENOUS | Status: AC
  Filled 2019-01-15: qty 10

## 2019-01-15 MED ORDER — PROPOFOL 1000 MG/100ML IV EMUL
0.0000 ug/kg/min | INTRAVENOUS | Status: DC
  Administered 2019-01-15: 20 ug/kg/min via INTRAVENOUS
  Administered 2019-01-15 (×2): 25 ug/kg/min via INTRAVENOUS
  Administered 2019-01-15: 15 ug/kg/min via INTRAVENOUS
  Administered 2019-01-16: 25 ug/kg/min via INTRAVENOUS
  Administered 2019-01-16: 50 ug/kg/min via INTRAVENOUS
  Administered 2019-01-16: 30 ug/kg/min via INTRAVENOUS
  Administered 2019-01-16 (×2): 50 ug/kg/min via INTRAVENOUS
  Administered 2019-01-16: 30 ug/kg/min via INTRAVENOUS
  Administered 2019-01-16: 07:00:00 25 ug/kg/min via INTRAVENOUS
  Administered 2019-01-17 (×2): 30 ug/kg/min via INTRAVENOUS
  Administered 2019-01-17: 28.0584 ug/kg/min via INTRAVENOUS
  Filled 2019-01-15 (×9): qty 100
  Filled 2019-01-15 (×2): qty 200

## 2019-01-15 MED ORDER — SODIUM CHLORIDE 0.9% IV SOLUTION: Freq: Once | INTRAVENOUS | Status: AC

## 2019-01-15 MED ORDER — LORAZEPAM 1 MG PO TABS
1.0000 mg | ORAL_TABLET | ORAL | Status: DC | PRN
  Administered 2019-01-15 (×2): 1 mg via ORAL
  Filled 2019-01-15 (×2): qty 2

## 2019-01-15 MED ORDER — VITAMIN D-3 125 MCG (5000 UT) PO TABS: 5000.0000 [IU] | ORAL_TABLET | Freq: Every day | ORAL | Status: DC

## 2019-01-15 MED ORDER — FENTANYL CITRATE (PF) 100 MCG/2ML IJ SOLN
50.0000 ug | INTRAMUSCULAR | Status: DC | PRN
  Administered 2019-01-17: 22:00:00 100 ug via INTRAVENOUS
  Filled 2019-01-15 (×2): qty 2

## 2019-01-15 NOTE — Plan of Care (Addendum)
  Problem: Education: Goal: Knowledge of General Education information will improve Description: Including pain rating scale, medication(s)/side effects and non-pharmacologic comfort measures 01/15/2019 1056 by Orvan Falconer, RN Outcome: Progressing 01/15/2019 1056 by Orvan Falconer, RN Outcome: Progressing   Problem: Health Behavior/Discharge Planning: Goal: Ability to manage health-related needs will improve 01/15/2019 1056 by Orvan Falconer, RN Outcome: Progressing 01/15/2019 1056 by Orvan Falconer, RN Outcome: Progressing   Problem: Clinical Measurements: Goal: Ability to maintain clinical measurements within normal limits will improve 01/15/2019 1056 by Orvan Falconer, RN Outcome: Progressing 01/15/2019 1056 by Orvan Falconer, RN Outcome: Progressing Goal: Will remain free from infection 01/15/2019 1056 by Orvan Falconer, RN Outcome: Progressing 01/15/2019 1056 by Orvan Falconer, RN Outcome: Progressing Goal: Diagnostic test results will improve 01/15/2019 1056 by Orvan Falconer, RN Outcome: Progressing 01/15/2019 1056 by Orvan Falconer, RN Outcome: Progressing Goal: Respiratory complications will improve 01/15/2019 1056 by Orvan Falconer, RN Outcome: Progressing 01/15/2019 1056 by Orvan Falconer, RN Outcome: Progressing Goal: Cardiovascular complication will be avoided 01/15/2019 1056 by Orvan Falconer, RN Outcome: Progressing 01/15/2019 1056 by Orvan Falconer, RN Outcome: Progressing   Problem: Activity: Goal: Risk for activity intolerance will decrease 01/15/2019 1056 by Orvan Falconer, RN Outcome: Progressing 01/15/2019 1056 by Orvan Falconer, RN Outcome: Progressing   Problem: Nutrition: Goal: Adequate nutrition will be maintained 01/15/2019 1056 by Orvan Falconer, RN Outcome: Progressing 01/15/2019 1056 by Orvan Falconer, RN Outcome: Progressing   Problem: Coping: Goal: Level of anxiety will decrease 01/15/2019  1056 by Orvan Falconer, RN Outcome: Progressing 01/15/2019 1056 by Orvan Falconer, RN Outcome: Progressing   Problem: Elimination: Goal: Will not experience complications related to bowel motility 01/15/2019 1056 by Orvan Falconer, RN Outcome: Progressing 01/15/2019 1056 by Orvan Falconer, RN Outcome: Progressing Goal: Will not experience complications related to urinary retention 01/15/2019 1056 by Orvan Falconer, RN Outcome: Progressing 01/15/2019 1056 by Orvan Falconer, RN Outcome: Progressing   Problem: Pain Managment: Goal: General experience of comfort will improve 01/15/2019 1056 by Orvan Falconer, RN Outcome: Progressing 01/15/2019 1056 by Orvan Falconer, RN Outcome: Progressing   Problem: Safety: Goal: Ability to remain free from injury will improve 01/15/2019 1056 by Orvan Falconer, RN Outcome: Progressing 01/15/2019 1056 by Orvan Falconer, RN Outcome: Progressing   Problem: Skin Integrity: Goal: Risk for impaired skin integrity will decrease 01/15/2019 1056 by Orvan Falconer, RN Outcome: Progressing 01/15/2019 1056 by Orvan Falconer, RN Outcome: Progressing

## 2019-01-15 NOTE — Anesthesia Procedure Notes (Signed)
Procedure Name: Intubation Date/Time: 01/15/2019 7:30 PM Performed by: Imagene Riches, CRNA Pre-anesthesia Checklist: Patient identified, Emergency Drugs available, Suction available and Patient being monitored Patient Re-evaluated:Patient Re-evaluated prior to induction Oxygen Delivery Method: Circle System Utilized Preoxygenation: Pre-oxygenation with 100% oxygen Induction Type: Rapid sequence Laryngoscope Size: Glidescope and 4 Grade View: Grade I Tube type: Oral Tube size: 8.0 mm Number of attempts: 1 Airway Equipment and Method: Stylet and Oral airway Placement Confirmation: ETT inserted through vocal cords under direct vision,  positive ETCO2 and breath sounds checked- equal and bilateral Secured at: 22 cm Tube secured with: Tape Dental Injury: Teeth and Oropharynx as per pre-operative assessment

## 2019-01-15 NOTE — Progress Notes (Signed)
Rapid response called at 1812. Pt to be transferred and placed on heated high flow when bed available. About 10 minutes ago the pt walked into the bathroom independently without any O2.  At this time the pt is unable to hold her head up on her own, sitting in the wheelchair. 15L HFNC and NRB on with sats 82-86%. Pt is only opening her eyes to sternal rub at this time.   76- ICU charge RN and MD at bedside. Stat ABG ordered previously. RT at bedside now. Pt to be transferred to the ICU after transferring back to bed.  1830- transferred pt from the wheelchair to the bed with the staff assistance.  22- ICU charge RN and MD feel that pt is too unstable to go to CT at this time.   1840- MD decided to intubate this patient. Pt leaving this room at this time.

## 2019-01-15 NOTE — Progress Notes (Signed)
[facility] Progress Note Patient Name: Kaitlin [name] DOB: 1965/12/29 MRN: [id number]   Date of Service  01/15/2019  HPI/Events of Note  Called to patient's bedside for acute worsening of this patient's hypoxemic respiratory failure. Patient is encephalopathic, trying to take off NRB (she is on HFNC + NRB). She is satting in mid-high 80s but RR > 45. CRNA already at bedside for intubation (RN had called rapid response).  eICU Interventions  Agree with intubation. Performed by CRNA using etomidate and rocuronium.  Sedation/analgesia ordered: fent intermittent pushes + propofol drip.  CXR reviewed: ETT and OG tube in appropriate positions. Severe bilateral infiltrates (R>L) consistent with COVID pneumonia / ARDS.  Maintain HOB at least 30 degrees in setting of morbid obesity. Minimize time spent flat in bed for turns, etc, as she will tend to desaturate rapidly in this position.  Repeat ABG 1 hour post-intubation.  RT to titrate vent settings per ARDSNet protocol including adjustment of TV to 400cc (6cc/kg IBW).     Intervention Category Major Interventions: Respiratory failure - evaluation and management  Charlott Rakes 01/15/2019, 7:24 PM

## 2019-01-15 NOTE — Progress Notes (Signed)
[facility] PROGRESS NOTE    Progress Note  Kaitlin [name]  QRF:[id number] DOB: Jul 06, 1965 DOA: 01/12/2019 PCP: Olga Coaster, [facility]     Brief Narrative:   Kaitlin [name] is an 54 y.o. 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  In the ED she was found to be hypoxic with a chest x-ray with bilateral infiltrates.  Assessment/Plan:   Acute respiratory failure with hypoxia secondarily to Pneumonia due to COVID-19 virus This morning she is requiring 15 L of oxygen to keep saturations greater than 92%, yesterday she was weaned down to 6 to 8 L of oxygen through nasal cannula.  Her oxygen requirements have been worsened drastically, question anxiety versus infectious etiology does not have leukocytosis has remained afebrile check a procalcitonin and a chest x-ray. Continue IV remdesivir and steroids laboratory markers continue to improve nicely.  She is status post Actemra on 01/12/2018 Try to keep the patient prone for at least 16 hours a day, if not prone out of bed to chair, continues into spirometry and a flutter valve. Continue Lovenox for DVT prophylaxis.  Will consult physical therapy. She relates she is a little bit anxious will start on low-dose Ativan.  Essential hypertension/hypotension: Blood pressure is improving nicely, her episode of hypotension was likely due to decreased oral intake, infectious etiology and diuretic use. Anti-hypertensive medications were held on admission she was started on IV fluids.  Now that her blood pressure is trending up I will start her antihypertensive medication slowly.  Insulin-dependent diabetes mellitus type 2: A1c was 7.1 and she was originally on an insulin pump which was  stopped, her blood glucose is erratic likely due to steroids. Her blood glucose is coming down nicely we will continue long-acting insulin plus sliding scale.  HLD (hyperlipidemia): Continue statins.   DVT prophylaxis: lovenox Family Communication:none Disposition Plan/Barrier to D/C: unable to determine Code Status:  Code Status History    Date Active Date Inactive Code Status Order ID Comments User Context   05/25/2013 1214 05/25/2013 1810 Full Code [id number]  Troy Sine, MD Inpatient   01/17/2013 1908 01/24/2013 1657 Full Code [id number]  Gaye Pollack, MD Inpatient   01/12/2013 1349 01/17/2013 1908 Full Code [id number]  Gaye Pollack, MD Inpatient   12/12/2012 0408 12/14/2012 1939 Full Code [id number]  Rise Patience, MD Inpatient   11/30/2012 1404 12/01/2012 1730 Full Code [id number]  Jodi Marble, MD Inpatient   11/06/2012 1347 11/10/2012 1658 Full Code [id number]  Gaye Pollack, MD Inpatient   Advance Care Planning Activity        IV Access:    Peripheral IV   Procedures and diagnostic studies:   No results found.   Medical Consultants:    None.  Anti-Infectives:   IV remdesivir  Subjective:    Kaitlin [name] she relates her breathing is significantly worse she relates she is very anxious and nervous she thinks things are going to go back she continues to have a persistent cough.   Objective:    Vitals:   01/15/19 0400 01/15/19 0500 01/15/19 0600 01/15/19 0730  BP:    (!) 141/93  Pulse: 96  85   Resp:   (!) 28   Temp:    97.9 F (36.6 C)  TempSrc:  Oral  SpO2: 90% 90% 90%   Weight:      Height:       SpO2: 90 % O2 Flow Rate (L/min): 15 L/min   Intake/Output Summary (Last 24 hours) at 01/15/2019 0749 Last data filed at 01/15/2019 0500 Gross per 24 hour  Intake 1000 ml  Output 1250 ml  Net -250 ml   Filed Weights   01/13/19 0410 01/13/19 0413  Weight: (!) 178.2 kg (!) 178.2 kg    Exam: General exam: In no acute distress.  Respiratory system: Good air movement and clear to auscultation. Cardiovascular system: S1 & S2 heard, RRR. No JVD. Gastrointestinal system: Abdomen is nondistended, soft and nontender.  Central nervous system: Alert and oriented. No focal neurological deficits. Extremities: No pedal edema. Skin: No rashes, lesions or ulcers Psychiatry: Judgement and insight appear normal. Mood & affect appropriate.   Data Reviewed:    Labs: Basic Metabolic Panel: Recent Labs  Lab 01/12/19 1838 01/13/19 0823 01/14/19 0147 01/15/19 0135  NA 138 141 140 141  K 4.0 4.1 4.5 4.6  CL 100 104 103 103  CO2 '28 26 27 29  ' GLUCOSE 233* 149* 305* 235*  BUN 22* 17 23* 21*  CREATININE 1.17* 0.76 0.89 0.86  CALCIUM 8.6* 8.4* 8.8* 8.9  MG  --  2.2  --   --    GFR Estimated Creatinine Clearance: 135.6 mL/min (by C-G formula based on SCr of 0.86 mg/dL). Liver Function Tests: Recent Labs  Lab 01/12/19 1838 01/13/19 0823 01/14/19 0147 01/15/19 0135  AST 69* 52* 47* 43*  ALT 37 33 37 38  ALKPHOS 65 57 61 64  BILITOT 0.6 0.6 0.7 0.4  PROT 6.8 6.4* 6.6 6.7  ALBUMIN 3.0* 2.6* 2.9* 2.7*   No results for input(s): LIPASE, AMYLASE in the last 168 hours. No results for input(s): AMMONIA in the last 168 hours. Coagulation profile No results for input(s): INR, PROTIME in the last 168 hours. COVID-19 Labs  Recent Labs    01/12/19 1838 01/13/19 0823 01/14/19 0147 01/14/19 0500 01/15/19 0135  DDIMER 1.54* 1.29* 1.23*  --  1.60*  FERRITIN 473*  --   --   --   --   LDH 397*  --   --   --   --   CRP 19.5* 19.3*  --  15.8* 8.2*    Lab Results  Component Value Date   SARSCOV2NAA NEGATIVE 11/19/2018    CBC: Recent Labs  Lab 01/12/19 1838 01/13/19 0823 01/14/19 0147 01/15/19 0135  WBC 8.7 6.1 7.5 7.5  NEUTROABS 6.9 4.6 5.6 4.8  HGB 12.7 11.6* 12.7 13.6  HCT 38.6 35.4* 38.9 41.6  MCV 76.9* 77.1* 76.6* 77.0*  PLT 203 207 254 305   Cardiac Enzymes: No results for input(s): CKTOTAL, CKMB,  CKMBINDEX, TROPONINI in the last 168 hours. BNP (last 3 results) No results for input(s): PROBNP in the last 8760 hours. CBG: Recent Labs  Lab 01/13/19 1628 01/13/19 2356 01/14/19 0413 01/14/19 0729 01/14/19 1623  GLUCAP 353* 332* 244* 223* 312*   D-Dimer: Recent Labs    01/14/19 0147 01/15/19 0135  DDIMER 1.23* 1.60*   Hgb A1c: Recent Labs    01/13/19 0823  HGBA1C 7.1*   Lipid Profile: Recent Labs    01/12/19 1838  TRIG 141   Thyroid function studies: No results for input(s): TSH, T4TOTAL, T3FREE, THYROIDAB in the last 72 hours.  Invalid input(s): FREET3 Anemia work up: Recent Labs    01/12/19 [place] 473*  Sepsis Labs: Recent Labs  Lab 01/12/19 1838 01/13/19 0823 01/14/19 0147 01/15/19 0135  PROCALCITON <0.10 0.10  --   --   WBC 8.7 6.1 7.5 7.5  LATICACIDVEN 1.1  --   --   --    Microbiology Recent Results (from the past 240 hour(s))  Blood Culture (routine x 2)     Status: None (Preliminary result)   Collection Time: 01/12/19  6:38 PM   Specimen: BLOOD  Result Value Ref Range Status   Specimen Description   Final    BLOOD RIGHT ANTECUBITAL Performed at [facility], [address] [place], [place] [address]    Special Requests   Final    BOTTLES DRAWN AEROBIC AND ANAEROBIC Blood Culture adequate volume Performed at [facility], [address], [place], [place] [address]    Culture   Final    NO GROWTH 2 DAYS Performed at [facility], [place] [address] [place], [place] [address]    Report Status PENDING  Incomplete  SARS Coronavirus 2 Ag (30 min TAT) - Nasal Swab (BD Veritor Kit)     Status: Abnormal   Collection Time: 01/12/19  6:38 PM   Specimen: Nasal Swab (BD Veritor Kit)  Result Value Ref Range Status   SARS Coronavirus 2 Ag POSITIVE (A) NEGATIVE Final    Comment: RESULT CALLED TO, READ BACK BY AND VERIFIED WITH: Marsa Aris RN AT 1942 ON 01/12/19 BY [email or web address] (NOTE) SARS-CoV-2 antigen PRESENT.  Positive results indicate the presence of viral antigens, but clinical correlation with patient history and other diagnostic information is necessary to determine patient infection status.  Positive results do not rule out bacterial infection or co-infection  with other viruses. False positive results are rare but can occur, and confirmatory RT-PCR testing may be appropriate in some circumstances. The expected result is Negative. Fact Sheet for Patients: PodPark.tn Fact Sheet for Providers: GiftContent.is  This test is not yet approved or cleared by the [place] FDA and  has been authorized for detection and/or diagnosis of SARS-CoV-2 by FDA under an Emergency Use Authorization (EUA).  This EUA will remain in effect (meaning this test can be used) for the duration of  the COVID-19  declaration under Section 564(b)(1) of the Act, 21 U.S.C. section 360bbb-3(b)(1), unless the authorization is terminated or revoked sooner. Performed at [facility], [address] [place], [place] [address]   Blood Culture (routine x 2)     Status: None (Preliminary result)   Collection Time: 01/12/19  7:36 PM   Specimen: BLOOD  Result Value Ref Range Status   Specimen Description   Final    BLOOD LEFT ANTECUBITAL Performed at [facility], [address] [place], [place] [address]    Special Requests   Final    BOTTLES DRAWN AEROBIC AND ANAEROBIC Blood Culture adequate volume Performed at [facility], [address] [place], [place] [address]    Culture   Final    NO GROWTH 2 DAYS Performed at [facility], [place] [address], [place], [place] [address]    Report Status PENDING  Incomplete     Medications:   . vitamin C  500 mg Oral Daily  . dexamethasone  6 mg Oral Daily  . enoxaparin (LOVENOX) injection  85 mg Subcutaneous Q24H  . insulin aspart  0-15 Units Subcutaneous TID WC  . insulin  aspart  0-5 Units Subcutaneous  QHS  . insulin aspart  15 Units Subcutaneous TID WC  . insulin detemir  65 Units Subcutaneous BID  . Ipratropium-Albuterol  2 puff Inhalation Q6H  . zinc sulfate  220 mg Oral Daily   Continuous Infusions: . remdesivir 100 mg in NS 100 mL Stopped (01/14/19 0915)     LOS: 3 days   Charlynne Cousins  [facility]  01/15/2019, 7:49 AM

## 2019-01-15 NOTE — Progress Notes (Signed)
Pharmacy Antibiotic Note  Kaitlin [name] is a 54 y.o. female admitted on 01/12/2019 with pneumonia.  Pharmacy has been consulted for Cefepime / Vancomycin  dosing.  Plan: Cefepime 2 grams iv Q 8 hours Vancomycin 2500 mg iv x 1 dose now then 1500 mg iv Q 12 hours (AUC of 542 using Scr of 1) Monitor Scr, cultures, progress    Height: 5\' 9"  (175.3 cm) Weight: (!) 392 lb 13.8 oz (178.2 kg) IBW/kg (Calculated) : 66.2  Temp (24hrs), Avg:98.4 F (36.9 C), Min:97.9 F (36.6 C), Max:98.7 F (37.1 C)  Recent Labs  Lab 01/12/19 1838 01/13/19 0823 01/14/19 0147 01/15/19 0135  WBC 8.7 6.1 7.5 7.5  CREATININE 1.17* 0.76 0.89 0.86  LATICACIDVEN 1.1  --   --   --     Estimated Creatinine Clearance: 135.6 mL/min (by C-G formula based on SCr of 0.86 mg/dL).    Allergies  Allergen Reactions  . Amoxicillin-Pot Clavulanate Diarrhea  . Ibuprofen Other (See Comments)    Per MD she should no longer take, unsure why     Thank you for allowing pharmacy to be a part of this patient's care. Anette Guarneri, PharmD  01/15/2019 4:28 PM

## 2019-01-15 NOTE — Transfer of Care (Signed)
Immediate Anesthesia Transfer of Care Note  Patient: Kaitlin [name]  Procedure(s) Performed: AN [place] INTUBATION  Patient Location: ICU  Anesthesia Type: [place] intubation  Level of Consciousness: sedated  Airway & Oxygen Therapy: Patient placed on Ventilator (see vital sign flow sheet for setting)  Post-op Assessment: Report given to RN and Post -op Vital signs reviewed and stable  Post vital signs: Reviewed and stable  Last Vitals:  Vitals Value Taken Time  BP 150/110 01/15/19 1955  Temp    Pulse 48 01/15/19 1958  Resp 24 01/15/19 1958  SpO2 93 % 01/15/19 1958  Vitals shown include unvalidated device data.  Last Pain:  Vitals:   01/15/19 1134  TempSrc: Axillary  PainSc:       Patients Stated Pain Goal: 5 ([id number])  Complications: No apparent anesthesia complications

## 2019-01-16 ENCOUNTER — Inpatient Hospital Stay (HOSPITAL_COMMUNITY): Payer: Medicaid Other

## 2019-01-16 DIAGNOSIS — J9601 Acute respiratory failure with hypoxia: Secondary | ICD-10-CM

## 2019-01-16 DIAGNOSIS — U071 COVID-19: Principal | ICD-10-CM

## 2019-01-16 DIAGNOSIS — J1282 Pneumonia due to coronavirus disease 2019: Secondary | ICD-10-CM

## 2019-01-16 LAB — ECHOCARDIOGRAM COMPLETE
Height: 69 in
Weight: 6285.76 oz

## 2019-01-16 LAB — POCT I-STAT 7, (LYTES, BLD GAS, ICA,H+H)
Acid-Base Excess: 8 mmol/L — ABNORMAL HIGH (ref 0.0–2.0)
Acid-Base Excess: 6 mmol/L — ABNORMAL HIGH (ref 0.0–2.0)
Acid-Base Excess: 8 mmol/L — ABNORMAL HIGH (ref 0.0–2.0)
Bicarbonate: 32 mmol/L — ABNORMAL HIGH (ref 20.0–28.0)
Bicarbonate: 31.9 mmol/L — ABNORMAL HIGH (ref 20.0–28.0)
Bicarbonate: 34.3 mmol/L — ABNORMAL HIGH (ref 20.0–28.0)
Calcium, Ion: 1.18 mmol/L (ref 1.15–1.40)
Calcium, Ion: 1.17 mmol/L (ref 1.15–1.40)
Calcium, Ion: 1.15 mmol/L (ref 1.15–1.40)
HCT: 41 % (ref 36.0–46.0)
HCT: 39 % (ref 36.0–46.0)
HCT: 43 % (ref 36.0–46.0)
Hemoglobin: 13.9 g/dL (ref 12.0–15.0)
Hemoglobin: 13.3 g/dL (ref 12.0–15.0)
Hemoglobin: 14.6 g/dL (ref 12.0–15.0)
O2 Saturation: 92 %
O2 Saturation: 97 %
O2 Saturation: 97 %
Patient temperature: 97.8
Patient temperature: 98.6
Potassium: 4.8 mmol/L (ref 3.5–5.1)
Potassium: 4.3 mmol/L (ref 3.5–5.1)
Potassium: 4 mmol/L (ref 3.5–5.1)
Sodium: 136 mmol/L (ref 135–145)
Sodium: 138 mmol/L (ref 135–145)
Sodium: 141 mmol/L (ref 135–145)
TCO2: 33 mmol/L — ABNORMAL HIGH (ref 22–32)
TCO2: 33 mmol/L — ABNORMAL HIGH (ref 22–32)
TCO2: 36 mmol/L — ABNORMAL HIGH (ref 22–32)
pCO2 arterial: 39.6 mmHg (ref 32.0–48.0)
pCO2 arterial: 48 mmHg (ref 32.0–48.0)
pCO2 arterial: 50.9 mmHg — ABNORMAL HIGH (ref 32.0–48.0)
pH, Arterial: 7.514 — ABNORMAL HIGH (ref 7.350–7.450)
pH, Arterial: 7.431 (ref 7.350–7.450)
pH, Arterial: 7.436 (ref 7.350–7.450)
pO2, Arterial: 57 mmHg — ABNORMAL LOW (ref 83.0–108.0)
pO2, Arterial: 93 mmHg (ref 83.0–108.0)
pO2, Arterial: 92 mmHg (ref 83.0–108.0)

## 2019-01-16 LAB — COMPREHENSIVE METABOLIC PANEL
ALT: 33 U/L (ref 0–44)
AST: 45 U/L — ABNORMAL HIGH (ref 15–41)
Albumin: 2.8 g/dL — ABNORMAL LOW (ref 3.5–5.0)
Alkaline Phosphatase: 72 U/L (ref 38–126)
Anion gap: 12 (ref 5–15)
BUN: 23 mg/dL — ABNORMAL HIGH (ref 6–20)
CO2: 29 mmol/L (ref 22–32)
Calcium: 8.8 mg/dL — ABNORMAL LOW (ref 8.9–10.3)
Chloride: 97 mmol/L — ABNORMAL LOW (ref 98–111)
Creatinine, Ser: 1.05 mg/dL — ABNORMAL HIGH (ref 0.44–1.00)
GFR calc Af Amer: >60 mL/min (ref >60)
GFR calc non Af Amer: >60 mL/min (ref >60)
Glucose, Bld: 256 mg/dL — ABNORMAL HIGH (ref 70–99)
Potassium: 4.5 mmol/L (ref 3.5–5.1)
Sodium: 138 mmol/L (ref 135–145)
Total Bilirubin: 0.4 mg/dL (ref 0.3–1.2)
Total Protein: 6.3 g/dL — ABNORMAL LOW (ref 6.5–8.1)

## 2019-01-16 LAB — TRIGLYCERIDES: Triglycerides: 228 mg/dL — ABNORMAL HIGH (ref <150)

## 2019-01-16 LAB — CBC WITH DIFFERENTIAL/PLATELET
Abs Immature Granulocytes: 0.03 10*3/uL (ref 0.00–0.07)
Basophils Absolute: 0 10*3/uL (ref 0.0–0.1)
Basophils Relative: 0 %
Eosinophils Absolute: 0 10*3/uL (ref 0.0–0.5)
Eosinophils Relative: 0 %
HCT: 41.8 % (ref 36.0–46.0)
Hemoglobin: 13.8 g/dL (ref 12.0–15.0)
Immature Granulocytes: 0 %
Lymphocytes Relative: 28 %
Lymphs Abs: 2.1 10*3/uL (ref 0.7–4.0)
MCH: 25.1 pg — ABNORMAL LOW (ref 26.0–34.0)
MCHC: 33 g/dL (ref 30.0–36.0)
MCV: 76 fL — ABNORMAL LOW (ref 80.0–100.0)
Monocytes Absolute: 0.3 10*3/uL (ref 0.1–1.0)
Monocytes Relative: 4 %
Neutro Abs: 5.1 10*3/uL (ref 1.7–7.7)
Neutrophils Relative %: 68 %
Platelets: 363 10*3/uL (ref 150–400)
RBC: 5.5 MIL/uL — ABNORMAL HIGH (ref 3.87–5.11)
RDW: 17.3 % — ABNORMAL HIGH (ref 11.5–15.5)
WBC: 7.5 10*3/uL (ref 4.0–10.5)
nRBC: 0 % (ref 0.0–0.2)

## 2019-01-16 LAB — GLUCOSE, CAPILLARY
Glucose-Capillary: 254 mg/dL — ABNORMAL HIGH (ref 70–99)
Glucose-Capillary: 280 mg/dL — ABNORMAL HIGH (ref 70–99)
Glucose-Capillary: 297 mg/dL — ABNORMAL HIGH (ref 70–99)
Glucose-Capillary: 236 mg/dL — ABNORMAL HIGH (ref 70–99)

## 2019-01-16 LAB — PHOSPHORUS
Phosphorus: 3.5 mg/dL (ref 2.5–4.6)
Phosphorus: 3.9 mg/dL (ref 2.5–4.6)

## 2019-01-16 LAB — MAGNESIUM
Magnesium: 2.1 mg/dL (ref 1.7–2.4)
Magnesium: 2.2 mg/dL (ref 1.7–2.4)

## 2019-01-16 LAB — D-DIMER, QUANTITATIVE: D-Dimer, Quant: 2.52 ug/mL-FEU — ABNORMAL HIGH (ref 0.00–0.50)

## 2019-01-16 LAB — PROCALCITONIN: Procalcitonin: <0.1 ng/mL

## 2019-01-16 LAB — C-REACTIVE PROTEIN: CRP: 4.2 mg/dL — ABNORMAL HIGH (ref <1.0)

## 2019-01-16 MED ORDER — PRO-STAT SUGAR FREE PO LIQD
60.0000 mL | Freq: Three times a day (TID) | ORAL | Status: DC
  Administered 2019-01-16 – 2019-01-23 (×22): 60 mL
  Filled 2019-01-16 (×22): qty 60

## 2019-01-16 MED ORDER — INSULIN ASPART 100 UNIT/ML ~~LOC~~ SOLN
10.0000 [IU] | SUBCUTANEOUS | Status: DC
  Administered 2019-01-16 – 2019-01-18 (×9): 10 [IU] via SUBCUTANEOUS

## 2019-01-16 MED ORDER — INSULIN ASPART 100 UNIT/ML ~~LOC~~ SOLN: 10.0000 [IU] | SUBCUTANEOUS | Status: DC

## 2019-01-16 MED ORDER — ASPIRIN 81 MG PO CHEW
81.0000 mg | CHEWABLE_TABLET | Freq: Every day | ORAL | Status: DC
  Administered 2019-01-16 – 2019-02-03 (×19): 81 mg
  Filled 2019-01-16 (×19): qty 1

## 2019-01-16 MED ORDER — PHENYLEPHRINE HCL-NACL 10-0.9 MG/250ML-% IV SOLN
0.0000 ug/min | INTRAVENOUS | Status: DC
  Administered 2019-01-16: 20 ug/min via INTRAVENOUS
  Administered 2019-01-16: 120 ug/min via INTRAVENOUS
  Administered 2019-01-17: 60 ug/min via INTRAVENOUS
  Filled 2019-01-16: qty 750
  Filled 2019-01-16 (×2): qty 250
  Filled 2019-01-16: qty 750

## 2019-01-16 MED ORDER — CHLORHEXIDINE GLUCONATE 0.12 % MT SOLN
15.0000 mL | Freq: Two times a day (BID) | OROMUCOSAL | Status: DC
  Administered 2019-01-16 – 2019-02-09 (×46): 15 mL via OROMUCOSAL
  Filled 2019-01-16 (×26): qty 15

## 2019-01-16 MED ORDER — FUROSEMIDE 10 MG/ML IJ SOLN
80.0000 mg | Freq: Two times a day (BID) | INTRAMUSCULAR | Status: DC
  Administered 2019-01-16 – 2019-01-22 (×14): 80 mg via INTRAVENOUS
  Filled 2019-01-16 (×18): qty 8

## 2019-01-16 MED ORDER — VITAL HIGH PROTEIN PO LIQD: 1000.0000 mL | ORAL | Status: DC

## 2019-01-16 MED ORDER — PRO-STAT SUGAR FREE PO LIQD
30.0000 mL | Freq: Two times a day (BID) | ORAL | Status: DC
  Administered 2019-01-16: 30 mL
  Filled 2019-01-16: qty 30

## 2019-01-16 MED ORDER — INSULIN ASPART 100 UNIT/ML ~~LOC~~ SOLN: 0.0000 [IU] | SUBCUTANEOUS | Status: DC

## 2019-01-16 MED ORDER — CARVEDILOL 25 MG PO TABS
25.0000 mg | ORAL_TABLET | Freq: Two times a day (BID) | ORAL | Status: DC
  Administered 2019-01-16: 25 mg
  Filled 2019-01-16 (×3): qty 1

## 2019-01-16 MED ORDER — VITAL 1.5 CAL PO LIQD
1000.0000 mL | ORAL | Status: DC
  Administered 2019-01-18 – 2019-01-23 (×6): 1000 mL

## 2019-01-16 MED ORDER — CHLORHEXIDINE GLUCONATE 0.12% ORAL RINSE (MEDLINE KIT)
15.0000 mL | Freq: Two times a day (BID) | OROMUCOSAL | Status: DC
  Administered 2019-01-16: 15 mL via OROMUCOSAL

## 2019-01-16 MED ORDER — ASCORBIC ACID 500 MG PO TABS
500.0000 mg | ORAL_TABLET | Freq: Every day | ORAL | Status: DC
  Administered 2019-01-16 – 2019-02-03 (×19): 500 mg
  Filled 2019-01-16 (×19): qty 1

## 2019-01-16 MED ORDER — ARTIFICIAL TEARS OPHTHALMIC OINT
1.0000 "application " | TOPICAL_OINTMENT | Freq: Three times a day (TID) | OPHTHALMIC | Status: DC
  Administered 2019-01-16 – 2019-01-22 (×19): 1 via OPHTHALMIC
  Filled 2019-01-16 (×3): qty 3.5

## 2019-01-16 MED ORDER — VECURONIUM BROMIDE 10 MG IV SOLR
10.0000 mg | INTRAVENOUS | Status: DC | PRN
  Administered 2019-01-18 – 2019-01-22 (×6): 10 mg via INTRAVENOUS
  Filled 2019-01-16 (×7): qty 10

## 2019-01-16 MED ORDER — ORAL CARE MOUTH RINSE
15.0000 mL | OROMUCOSAL | Status: DC
  Administered 2019-01-16 – 2019-02-01 (×162): 15 mL via OROMUCOSAL

## 2019-01-16 MED ORDER — FENTANYL CITRATE (PF) 100 MCG/2ML IJ SOLN
50.0000 ug | Freq: Once | INTRAMUSCULAR | Status: AC
  Administered 2019-01-16: 50 ug via INTRAVENOUS
  Filled 2019-01-16: qty 2

## 2019-01-16 MED ORDER — VITAMIN D 25 MCG (1000 UNIT) PO TABS
1000.0000 [IU] | ORAL_TABLET | Freq: Every day | ORAL | Status: DC
  Administered 2019-01-16 – 2019-02-03 (×19): 1000 [IU]
  Filled 2019-01-16 (×20): qty 1

## 2019-01-16 MED ORDER — FENTANYL BOLUS VIA INFUSION
50.0000 ug | INTRAVENOUS | Status: DC | PRN
  Filled 2019-01-16: qty 50

## 2019-01-16 MED ORDER — ZINC SULFATE 220 (50 ZN) MG PO CAPS
220.0000 mg | ORAL_CAPSULE | Freq: Every day | ORAL | Status: DC
  Administered 2019-01-16 – 2019-02-03 (×19): 220 mg
  Filled 2019-01-16 (×19): qty 1

## 2019-01-16 MED ORDER — INSULIN ASPART 100 UNIT/ML ~~LOC~~ SOLN
0.0000 [IU] | SUBCUTANEOUS | Status: DC
  Administered 2019-01-16: 5 [IU] via SUBCUTANEOUS
  Administered 2019-01-16 (×3): 8 [IU] via SUBCUTANEOUS
  Administered 2019-01-17: 01:00:00 5 [IU] via SUBCUTANEOUS
  Administered 2019-01-17: 11 [IU] via SUBCUTANEOUS
  Administered 2019-01-17: 18:00:00 5 [IU] via SUBCUTANEOUS
  Administered 2019-01-18: 15 [IU] via SUBCUTANEOUS
  Administered 2019-01-18: 05:00:00 3 [IU] via SUBCUTANEOUS
  Administered 2019-01-18: 2 [IU] via SUBCUTANEOUS
  Administered 2019-01-18 (×2): 5 [IU] via SUBCUTANEOUS

## 2019-01-16 MED ORDER — IPRATROPIUM-ALBUTEROL 0.5-2.5 (3) MG/3ML IN SOLN
3.0000 mL | Freq: Four times a day (QID) | RESPIRATORY_TRACT | Status: DC
  Administered 2019-01-16 – 2019-01-22 (×19): 3 mL via RESPIRATORY_TRACT
  Filled 2019-01-16 (×19): qty 3

## 2019-01-16 MED ORDER — DEXAMETHASONE 6 MG PO TABS
6.0000 mg | ORAL_TABLET | Freq: Every day | ORAL | Status: DC
  Administered 2019-01-16 – 2019-01-17 (×2): 6 mg
  Filled 2019-01-16 (×3): qty 1

## 2019-01-16 MED ORDER — FAMOTIDINE 40 MG/5ML PO SUSR
20.0000 mg | Freq: Two times a day (BID) | ORAL | Status: DC
  Administered 2019-01-16 – 2019-02-01 (×31): 20 mg
  Filled 2019-01-16 (×31): qty 2.5

## 2019-01-16 MED ORDER — FENTANYL 2500MCG IN NS 250ML (10MCG/ML) PREMIX INFUSION
50.0000 ug/h | INTRAVENOUS | Status: DC
  Administered 2019-01-16: 50 ug/h via INTRAVENOUS
  Administered 2019-01-17: 100 ug/h via INTRAVENOUS
  Administered 2019-01-18 (×2): 300 ug/h via INTRAVENOUS
  Administered 2019-01-18: 200 ug/h via INTRAVENOUS
  Administered 2019-01-19: 300 ug/h via INTRAVENOUS
  Filled 2019-01-16 (×6): qty 250

## 2019-01-16 MED ORDER — ENOXAPARIN SODIUM 100 MG/ML ~~LOC~~ SOLN
85.0000 mg | Freq: Two times a day (BID) | SUBCUTANEOUS | Status: DC
  Administered 2019-01-16 – 2019-01-22 (×13): 85 mg via SUBCUTANEOUS
  Filled 2019-01-16 (×15): qty 1

## 2019-01-16 MED ORDER — ROSUVASTATIN CALCIUM 20 MG PO TABS
20.0000 mg | ORAL_TABLET | Freq: Every evening | ORAL | Status: DC
  Administered 2019-01-16 – 2019-01-24 (×9): 20 mg
  Filled 2019-01-16 (×9): qty 1

## 2019-01-16 NOTE — Procedures (Signed)
Central Venous Catheter Insertion Procedure Note Kaitlin [name] QR:[id number] 1965-03-21  Procedure: Insertion of Central Venous Catheter Indications: Assessment of intravascular volume, Drug and/or fluid administration and Frequent blood sampling  Procedure Details Consent: Risks of procedure as well as the alternatives and risks of each were explained to the (patient/caregiver).  Consent for procedure obtained. Time Out: Verified patient identification, verified procedure, site/side was marked, verified correct patient position, special equipment/implants available, medications/allergies/relevent history reviewed, required imaging and test results available.  Performed  Real time [place] used to ID and cannulate vessel  Maximum sterile technique was used including antiseptics, cap, gloves, gown, hand hygiene, mask and sheet. Skin prep: Chlorhexidine; local anesthetic administered A antimicrobial bonded/coated triple lumen catheter was placed in the left internal jugular vein using the Seldinger technique.  Evaluation Blood flow good Complications: No apparent complications Patient did tolerate procedure well. Chest X-ray ordered to verify placement.  CXR: pending.  Clementeen Graham 01/16/2019, 12:27 PM  Erick Colace ACNP-BC [place] Pager # [phone] OR # [phone] if no answer

## 2019-01-16 NOTE — Progress Notes (Signed)
[facility] PROGRESS NOTE    Progress Note  VALBONA SLABACH  ERX:[id number] DOB: [birth year] DOA: 01/12/2019 PCP: Olga Coaster, [facility]     Brief Narrative:   Kaitlin [name] is an 54 y.o. 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  In the ED she was found to be hypoxic with a chest x-ray with bilateral infiltrates.  Assessment/Plan:   Acute respiratory failure with hypoxia secondarily to Pneumonia due to COVID-19 virus: Yesterday night she became somnolent and encephalopathic requiring 15 L of high flow nasal cannula and a nonrebreather. She was previously on 4 L from admission. A CT angio of the chest is pending. PCCM was consulted and intubation was performed yesterday night, and ABG was done that showed hypercarbia. Continue IV remdesivir and steroids she did receive a single dose 01/13/2019 of Actemra, She was covered empirically with IV vancomycin and cefepime for concerns of a new bacterial infection as she was encephalopathic we are awaiting CT of the chest, that would also help rule out PE. Further management per PCCM.  We will sign off.  Essential hypertension/hypotension: Blood pressure has improved nicely since her episode of hypotension likely due to decreased oral intake in the setting of infectious etiologies and ongoing antihypertensive medication.  Insulin-dependent diabetes mellitus type 2: Currently n.p.o. with a nasogastric tube, currently on dexamethasone long-acting insulin plus sliding scale.  HLD (hyperlipidemia): Continue statins.   DVT prophylaxis: lovenox Family Communication:none Disposition Plan/Barrier to D/C: unable to determine Code Status:  Code Status History    Date Active Date  Inactive Code Status Order ID Comments User Context   05/25/2013 1214 05/25/2013 1810 Full Code [id number]  Troy Sine, MD Inpatient   01/17/2013 1908 01/24/2013 1657 Full Code [id number]  Gaye Pollack, MD Inpatient   01/12/2013 1349 01/17/2013 1908 Full Code [id number]  Gaye Pollack, MD Inpatient   12/12/2012 0408 12/14/2012 1939 Full Code [id number]  Rise Patience, MD Inpatient   11/30/2012 1404 12/01/2012 1730 Full Code [id number]  Jodi Marble, MD Inpatient   11/06/2012 1347 11/10/2012 1658 Full Code [id number]  Gaye Pollack, MD Inpatient   Advance Care Planning Activity        IV Access:    Peripheral IV   Procedures and diagnostic studies:   DG CHEST PORT 1 VIEW  Result Date: 01/15/2019 CLINICAL DATA:  ETT placement, COVID positive EXAM: PORTABLE CHEST 1 VIEW COMPARISON:  01/15/2019 FINDINGS: Endotracheal tube terminates 3 cm above the carina. Gastric tube courses through in off the field of the radiograph. Cardiomediastinal contours are stable and likely enlarged. Changes of median sternotomy are noted. Diffuse interstitial and airspace opacities appear stable accounting for differences in technique remaining severe and worse on the right. Visualized skeletal structures with limited assessment are unremarkable. IMPRESSION: 1. Diffuse interstitial and airspace opacities with diminished lung volumes otherwise no change following intubation. 2. Endotracheal tube 3 cm above the carina. 3. Gastric tube courses through off the field of the radiograph. Electronically Signed   By: Zetta Bills M.D.   On: 01/15/2019 19:56   DG CHEST PORT 1 VIEW  Result Date: 01/15/2019 CLINICAL DATA:  54 year old female positive COVID-19. Shortness of breath. EXAM: PORTABLE CHEST 1 VIEW COMPARISON:  Portable chest 01/12/2019 and earlier. FINDINGS: Portable AP upright view at  0925 hours. Stable low lung volumes. Mediastinal contours remain within normal limits. Prior sternotomy. Visualized tracheal air  column is within normal limits. Widespread bilateral coarse and indistinct pulmonary opacity persists, more pronounced in the right lung and not significantly changed since 01/12/2019. No superimposed pneumothorax. No pleural effusion is evident. IMPRESSION: Low lung volumes with bilateral COVID-19 pneumonia not significantly changed from 01/12/2019. Electronically Signed   By: Genevie Ann M.D.   On: 01/15/2019 09:32     Medical Consultants:    None.  Anti-Infectives:   IV remdesivir  Subjective:    Glennon Hamilton intubated and sedated.   Objective:    Vitals:   01/16/19 0630 01/16/19 0645 01/16/19 0700 01/16/19 0715  BP: (!) 144/98 137/81 116/83 (!) 137/119  Pulse: 92 89 96 93  Resp: _0 Temp: 99.4 F (37.4 C)     TempSrc: Oral     SpO2: 91% 90% 91% 90%  Weight:      Height:       SpO2: 90 % O2 Flow Rate (L/min): 15 L/min FiO2 (%): 100 %  No intake or output data in the 24 hours ending 01/16/19 0752 Filed Weights   01/13/19 0410 01/13/19 0413  Weight: (!) 178.2 kg (!) 178.2 kg    Exam: General exam: In no acute distress. Respiratory system: Good air movement and clear to auscultation. Cardiovascular system: S1 & S2 heard, RRR. No JVD. Gastrointestinal system: Abdomen is nondistended, soft and nontender.  Central nervous system: Alert and oriented. No focal neurological deficits. Extremities: No pedal edema. Skin: No rashes, lesions or ulcers Psychiatry: Judgement and insight appear normal. Mood & affect appropriate.   Data Reviewed:    Labs: Basic Metabolic Panel: Recent Labs  Lab 01/12/19 1838 01/13/19 0823 01/14/19 0147 01/15/19 0135 01/15/19 1903 01/15/19 1915  NA 138 141 140 141 138 135  K 4.0 4.1 4.5 4.6 4.5 4.5  CL 100 104 103 103  --   --   CO2 _1 --   --   GLUCOSE 233* 149* 305* 235*  --   --   BUN 22* 17 23* 21*  --   --   CREATININE 1.17* 0.76 0.89 0.86  --   --   CALCIUM 8.6* 8.4* 8.8* 8.9  --   --   MG  --  2.2   --   --   --   --    GFR Estimated Creatinine Clearance: 135.6 mL/min (by C-G formula based on SCr of 0.86 mg/dL). Liver Function Tests: Recent Labs  Lab 01/12/19 1838 01/13/19 0823 01/14/19 0147 01/15/19 0135  AST 69* 52* 47* 43*  ALT 37 33 37 38  ALKPHOS 65 57 61 64  BILITOT 0.6 0.6 0.7 0.4  PROT 6.8 6.4* 6.6 6.7  ALBUMIN 3.0* 2.6* 2.9* 2.7*   No results for input(s): LIPASE, AMYLASE in the last 168 hours. No results for input(s): AMMONIA in the last 168 hours. Coagulation profile No results for input(s): INR, PROTIME in the last 168 hours. COVID-19 Labs  Recent Labs    01/13/19 0823 01/14/19 0147 01/14/19 0500 01/15/19 0135  DDIMER 1.29* 1.23*  --  1.60*  CRP 19.3*  --  15.8* 8.2*    Lab Results  Component Value Date   SARSCOV2NAA NEGATIVE 11/19/2018    CBC: Recent Labs  Lab 01/12/19 1838 01/13/19 0823 01/14/19 0147 01/15/19 0135 01/15/19 1903 01/15/19 1915  WBC 8.7 6.1 7.5 7.5  --   --  NEUTROABS 6.9 4.6 5.6 4.8  --   --   HGB 12.7 11.6* 12.7 13.6 15.0 15.0  HCT 38.6 35.4* 38.9 41.6 44.0 44.0  MCV 76.9* 77.1* 76.6* 77.0*  --   --   PLT 203 207 254 305  --   --    Cardiac Enzymes: No results for input(s): CKTOTAL, CKMB, CKMBINDEX, TROPONINI in the last 168 hours. BNP (last 3 results) No results for input(s): PROBNP in the last 8760 hours. CBG: Recent Labs  Lab 01/15/19 0737 01/15/19 1132 01/15/19 1623 01/15/19 2021 01/15/19 2228  GLUCAP 218* 196* 284* 292* 308*   D-Dimer: Recent Labs    01/14/19 0147 01/15/19 0135  DDIMER 1.23* 1.60*   Hgb A1c: Recent Labs    01/13/19 0823  HGBA1C 7.1*   Lipid Profile: No results for input(s): CHOL, HDL, LDLCALC, TRIG, CHOLHDL, LDLDIRECT in the last 72 hours. Thyroid function studies: No results for input(s): TSH, T4TOTAL, T3FREE, THYROIDAB in the last 72 hours.  Invalid input(s): FREET3 Anemia work up: No results for input(s): VITAMINB12, FOLATE, FERRITIN, TIBC, IRON, RETICCTPCT in the last  72 hours. Sepsis Labs: Recent Labs  Lab 01/12/19 1838 01/13/19 0823 01/14/19 0147 01/15/19 0135 01/15/19 0343  PROCALCITON <0.10 0.10  --   --  <0.10  WBC 8.7 6.1 7.5 7.5  --   LATICACIDVEN 1.1  --   --   --   --    Microbiology Recent Results (from the past 240 hour(s))  Blood Culture (routine x 2)     Status: None (Preliminary result)   Collection Time: 01/12/19  6:38 PM   Specimen: BLOOD  Result Value Ref Range Status   Specimen Description   Final    BLOOD RIGHT ANTECUBITAL Performed at [facility], [address] [place], [place] [address]    Special Requests   Final    BOTTLES DRAWN AEROBIC AND ANAEROBIC Blood Culture adequate volume Performed at [facility], [address], [place], [place] [address]    Culture   Final    NO GROWTH 3 DAYS Performed at [facility], [place] [address], [place], [place] [address]    Report Status PENDING  Incomplete  SARS Coronavirus 2 Ag (30 min TAT) - Nasal Swab (BD Veritor Kit)     Status: Abnormal   Collection Time: 01/12/19  6:38 PM   Specimen: Nasal Swab (BD Veritor Kit)  Result Value Ref Range Status   SARS Coronavirus 2 Ag POSITIVE (A) NEGATIVE Final    Comment: RESULT CALLED TO, READ BACK BY AND VERIFIED WITH: Marsa Aris RN AT 1942 ON 01/12/19 BY [email or web address] (NOTE) SARS-CoV-2 antigen PRESENT. Positive results indicate the presence of viral antigens, but clinical correlation with patient history and other diagnostic information is necessary to determine patient infection status.  Positive results do not rule out bacterial infection or co-infection  with other viruses. False positive results are rare but can occur, and confirmatory RT-PCR testing may be appropriate in some circumstances. The expected result is Negative. Fact Sheet for Patients: PodPark.tn Fact Sheet for Providers: GiftContent.is  This test is not yet approved or cleared by the  [place] FDA and  has been authorized for detection and/or diagnosis of SARS-CoV-2 by FDA under an Emergency Use Authorization (EUA).  This EUA will remain in effect (meaning this test can be used) for the duration of  the COVID-19  declaration under Section 564(b)(1) of the Act, 21 U.S.C. section 360bbb-3(b)(1), unless the  authorization is terminated or revoked sooner. Performed at [facility], [address] [place], [place] [address]   Blood Culture (routine x 2)     Status: None (Preliminary result)   Collection Time: 01/12/19  7:36 PM   Specimen: BLOOD  Result Value Ref Range Status   Specimen Description   Final    BLOOD LEFT ANTECUBITAL Performed at [facility], [address] [place], [place] [address]    Special Requests   Final    BOTTLES DRAWN AEROBIC AND ANAEROBIC Blood Culture adequate volume Performed at [facility], [address] [place], [place] [address]    Culture   Final    NO GROWTH 3 DAYS Performed at [facility], [place] [address], [place], [place] [address]    Report Status PENDING  Incomplete     Medications:   . vitamin C  500 mg Oral Daily  . aspirin EC  81 mg Oral Daily  . benzonatate  200 mg Oral TID  . carvedilol  25 mg Oral BID WC  . chlorhexidine gluconate (MEDLINE KIT)  15 mL Mouth Rinse BID  . Chlorhexidine Gluconate Cloth  6 each Topical Daily  . dexamethasone  6 mg Oral Daily  . enoxaparin (LOVENOX) injection  85 mg Subcutaneous Q24H  . etomidate      . furosemide  40 mg Oral BID  . insulin aspart  0-15 Units Subcutaneous TID WC  . insulin aspart  0-5 Units Subcutaneous QHS  . insulin aspart  15 Units Subcutaneous TID WC  . insulin detemir  65 Units Subcutaneous BID  . Ipratropium-Albuterol  2 puff Inhalation Q6H  . mouth rinse  15 mL Mouth Rinse 10 times per day  . pantoprazole  40 mg Oral BID  . rosuvastatin  20 mg Oral QPM  . cholecalciferol  1,000 Units Oral Daily  . zinc sulfate   220 mg Oral Daily   Continuous Infusions: . ceFEPime (MAXIPIME) IV 2 g (01/16/19 0044)  . famotidine (PEPCID) IV 20 mg (01/15/19 2226)  . propofol (DIPRIVAN) infusion 25 mcg/kg/min (01/16/19 0646)  . remdesivir 100 mg in NS 100 mL Stopped (01/15/19 0930)  . vancomycin       LOS: 4 days   Charlynne Cousins  [facility]  01/16/2019, 7:52 AM

## 2019-01-16 NOTE — Progress Notes (Signed)
Updated daughter via phone.   Erick Colace ACNP-BC [place] Pager # [phone] OR # [phone] if no answer

## 2019-01-16 NOTE — Consult Note (Addendum)
NAME:  Kaitlin [name], MRN:  [id number], DOB:  17-Mar-1965, LOS: 4 ADMISSION DATE:  01/12/2019, CONSULTATION DATE:  11/4 REFERRING MD:  Olevia Bowens, CHIEF COMPLAINT: Respiratory failure and ventilator management  Brief History   54 year old female admitted 1/1 with Covid pneumonia.  Had initially been improving over hospital course from 1/1 through 1/4 after receiving supplemental oxygen, remdesivir, systemic steroids and Actemra. On 1/4 developed worsening hypoxia followed by decreased mental status after patient got up without oxygen ultimately requiring intubation   History of present illness   54 year old female with history as mentioned below presented to the emergency room on 1/1 with chief complaint of shortness of breath, cough, sore throat and headache.  On ER evaluation was noted to be hypoxic chest x-ray with diffuse bilateral infiltrates Covid was positive she was admitted to [facility].  Treatment to date has included: Supplemental oxygen, systemic steroids, remdesivir, and Actemra.  Up until 1/4 her inflammatory markers as well as oxygen requirements had been improving.  She developed progressive hypoxemia on 1/4 then decreased mental status presumptively secondary to hypercarbia requiring intubation  Past Medical History  Morbid obesity, diabetes type 2, hypertension, chronic diastolic heart failure, sickle cell trait, recent thymoma resection  Significant Hospital Events   1/1: Admitted started on supplemental oxygen, remdesivir, and systemic steroids  1/2 received Actemra 1/3: Oxygen requirements improving continuing remdesivir and steroids inflammatory markers starting to improve 1/4: Oxygen requirements increasing, had been down to 6 to 8 L via nasal cannula, this required titration up to 15 L.  She was more anxious.  There was not significant change in her chest x-ray.  The patient became more confused in the early evening hours got out of bed without oxygen after this  event more obtunded pulse oximetry 82 to 86% on high flow nasal cannula and nonrebreather she was unresponsive to sternal rub she was intubated at [place].  Post intubation ABG still hypercarbic, had significant hypoxia   Consults:  Critical care consulted 1/4  Procedures:  Oral endotracheal tube 1/4  Significant Diagnostic Tests:  CT chest 1/5>>>  Micro Data:  Coronavirus 2 antigen 1/1: Positive Blood cultures x2 1/1>>>  Antimicrobials:  Vancomycin 1/4 Cefepime 1/4  Interim history/subjective:  Sedated, requiring high doses of propofol  Objective   Blood pressure (Abnormal) 137/119, pulse 93, temperature 99.4 F (37.4 C), temperature source Oral, resp. rate 20, height 5\' 9"  (1.753 m), weight (Abnormal) 178.2 kg, last menstrual period 06/17/2012, SpO2 90 %.    Vent Mode: PRVC FiO2 (%):  [100 %] 100 % Set Rate:  [20 bmp-24 bmp] 20 bmp Vt Set:  [520 mL] 520 mL PEEP:  [14 cmH20] 14 cmH20 Plateau Pressure:  [33 cmH20-34 cmH20] 34 cmH20  No intake or output data in the 24 hours ending 01/16/19 0830 Filed Weights   01/13/19 0410 01/13/19 0413  Weight: (Abnormal) 178.2 kg (Abnormal) 178.2 kg    Examination: General: Obese 54 year old female currently sedated on ventilator HENT: Normocephalic atraumatic orally intubated pupils nonicteric no JVD Lungs: Scattered rhonchi no accessory use equal chest rise bilaterally Ventilator settings: FiO2:100 PEEP:14 Plateau pressure:29 after decreasing Vt to 41ml/kg Driving [id number] Cardiovascular: Regular rate and rhythm Abdomen: Soft nontender positive bowel sounds Extremities: Warm and dry brisk cap refill Neuro: Sedated but will follow commands GU: Clear yellow urine  Resolved Hospital Problem list     Assessment & Plan:   Acute hypoxic and hypercarbic respiratory failure in the setting of Covid pneumonia and ARDS superimposed on underlying morbid  obesity/obesity hypoventilation syndrome/sleep apnea -Cannot exclude element of  pulmonary edema given history of diastolic dysfunction but suspect hypercarbia was what ultimately resulted in her somnolent state requiring intubation -Portable chest x-ray personally reviewed from post intubation on 1/4 shows endotracheal tube in satisfactory position.  Aeration is overall better however there remains to be diffuse bilateral airspace disease Plan Continue low tidal volume ventilation, ensure she is a 6 mL/kg predicted body weight PaO2 target 55-65 P/F ratio greater than 150 in the supine position, if this is not achieved she will need to continue proning protocol Target plateau pressure less than 30, Target driving pressure less than 15 Continue to titrate PEEP/FiO2 She is now on day #5 of 5 remdesivir this will complete therapy Day #5 of 10 Decadron Day #2 vancomycin and cefepime, empiric for possible HCAP Send sputum culture, check procalcitonin Will hold off on CT chest for now; hypoxia seems to be well explained by CXR findings. If echo shows RV dysfxn will re-consider.  IV diuresis Continue zinc and vitamin C and vitamin D PAD protocol RASS goal -4, with intermittent neuromuscular blockade  History of diastolic dysfunction -Not clear to me how much this is playing a role currently Plan Repeat echocardiogram ruling out systolic component IV Lasix Continue Coreg, And statin via tube Change aspirin to chewable from enteric-coated  Diabetes type 2 with hyperglycemia, looks like this is being exacerbated by systemic steroids Plan Continuing sliding scale insulin, changed to every 4 Adjusted basal insulin to 10 every 4 Continuing her Levemir twice daily Best practice:  Diet: Starting tube feeds 1/5 Pain/Anxiety/Delirium protocol (if indicated): 1/4 VAP protocol (if indicated): 1/4 DVT prophylaxis: Low molecular weight heparin GI prophylaxis: H2 blockade Glucose control: Sliding scale insulin Mobility: Bedrest Code Status: Full code Family Communication:  Pending per primary service Disposition:  ICU Labs   CBC: Recent Labs  Lab 01/12/19 1838 01/13/19 0823 01/14/19 0147 01/15/19 0135 01/15/19 1903 01/15/19 1915 01/16/19 0545  WBC 8.7 6.1 7.5 7.5  --   --  7.5  NEUTROABS 6.9 4.6 5.6 4.8  --   --  5.1  HGB 12.7 11.6* 12.7 13.6 15.0 15.0 13.8  HCT 38.6 35.4* 38.9 41.6 44.0 44.0 41.8  MCV 76.9* 77.1* 76.6* 77.0*  --   --  76.0*  PLT 203 207 254 305  --   --  [id number]    Basic Metabolic Panel: Recent Labs  Lab 01/12/19 1838 01/13/19 0823 01/14/19 0147 01/15/19 0135 01/15/19 1903 01/15/19 1915 01/16/19 0545  NA 138 141 140 141 138 135 138  K 4.0 4.1 4.5 4.6 4.5 4.5 4.5  CL 100 104 103 103  --   --  97*  CO2 28 26 27 29   --   --  29  GLUCOSE 233* 149* 305* 235*  --   --  256*  BUN 22* 17 23* 21*  --   --  23*  CREATININE 1.17* 0.76 0.89 0.86  --   --  1.05*  CALCIUM 8.6* 8.4* 8.8* 8.9  --   --  8.8*  MG  --  2.2  --   --   --   --   --    GFR: Estimated Creatinine Clearance: 111.1 mL/min (A) (by C-G formula based on SCr of 1.05 mg/dL (H)). Recent Labs  Lab 01/12/19 1838 01/13/19 0823 01/14/19 0147 01/15/19 0135 01/15/19 0343 01/16/19 0545  PROCALCITON <0.10 0.10  --   --  <0.10  --   WBC 8.7 6.1 7.5  7.5  --  7.5  LATICACIDVEN 1.1  --   --   --   --   --     Liver Function Tests: Recent Labs  Lab 01/12/19 1838 01/13/19 0823 01/14/19 0147 01/15/19 0135 01/16/19 0545  AST 69* 52* 47* 43* 45*  ALT 37 33 37 38 33  ALKPHOS 65 57 61 64 72  BILITOT 0.6 0.6 0.7 0.4 0.4  PROT 6.8 6.4* 6.6 6.7 6.3*  ALBUMIN 3.0* 2.6* 2.9* 2.7* 2.8*   No results for input(s): LIPASE, AMYLASE in the last 168 hours. No results for input(s): AMMONIA in the last 168 hours.  ABG    Component Value Date/Time   PHART 7.401 01/15/2019 1915   PCO2ART 51.8 (H) 01/15/2019 1915   PO2ART 41.0 (L) 01/15/2019 1915   HCO3 32.2 (H) 01/15/2019 1915   TCO2 34 (H) 01/15/2019 1915   ACIDBASEDEF 3.0 (H) 01/12/2013 1950   O2SAT 75.0 01/15/2019  1915     Coagulation Profile: No results for input(s): INR, PROTIME in the last 168 hours.  Cardiac Enzymes: No results for input(s): CKTOTAL, CKMB, CKMBINDEX, TROPONINI in the last 168 hours.  HbA1C: Hgb A1c MFr Bld  Date/Time Value Ref Range Status  01/13/2019 08:23 AM 7.1 (H) 4.8 - 5.6 % Final    Comment:    (NOTE) Pre diabetes:          5.7%-6.4% Diabetes:              >6.4% Glycemic control for   <7.0% adults with diabetes     CBG: Recent Labs  Lab 01/15/19 0737 01/15/19 1132 01/15/19 1623 01/15/19 2021 01/15/19 2228  GLUCAP 218* 196* 284* 292* 308*    Review of Systems:   Not able  Past Medical History  She,  has a past medical history of Anemia, Bronchitis, Cataracts, bilateral (01/12/2018), CHF (congestive heart failure) ([place]), Diabetes mellitus, High cholesterol, Hypertension, Leukocytosis, Morbid obesity ([place]) (09/11/2011), Neuropathy (01/12/2018), NICM (nonischemic cardiomyopathy) ([place]) (05/01/2013), Obesity, Sickle cell trait ([place]), and Thymoma.   Surgical History    Past Surgical History:  Procedure Laterality Date  . COLONOSCOPY N/A 06/21/2017   Procedure: COLONOSCOPY;  Surgeon: Ronnette Juniper, MD;  Location: [facility]OSCOPY;  Service: Gastroenterology;  Laterality: N/A;  . LEFT HEART CATHETERIZATION WITH CORONARY ANGIOGRAM N/A 05/25/2013   Procedure: LEFT HEART CATHETERIZATION WITH CORONARY ANGIOGRAM;  Surgeon: Troy Sine, MD;  Location: [facility] CATH LAB;  Service: Cardiovascular;  Laterality: N/A;  . LYMPH NODE BIOPSY Right 11/30/2012   Procedure: RIGHT TONSIL BIOPSY WITH FRESH FROZEN ANALYSIS;  Surgeon: Jodi Marble, MD;  Location: [facility];  Service: ENT;  Laterality: Right;  . MEDIASTERNOTOMY N/A 01/12/2013   Procedure: MEDIAN STERNOTOMY;  Surgeon: Gaye Pollack, MD;  Location: [facility];  Service: Thoracic;  Laterality: N/A;  . MEDIASTINOTOMY CHAMBERLAIN MCNEIL Right 11/06/2012   Procedure: MEDIASTINOTOMY CHAMBERLAIN MCNEILPROCEDURE;  Surgeon: Gaye Pollack,  MD;  Location: [facility];  Service: Thoracic;  Laterality: Right;  . POLYPECTOMY  06/21/2017   Procedure: POLYPECTOMY;  Surgeon: Ronnette Juniper, MD;  Location: [facility];  Service: Gastroenterology;;  . RESECTION OF A THYMOMA N/A 01/12/2013   Procedure: RESECTION OF A THYMOMA;  Surgeon: Gaye Pollack, MD;  Location: [facility];  Service: Thoracic;  Laterality: N/A;  . TONSILLECTOMY    . TUBAL LIGATION       Social History   reports that she is a non-smoker but has been exposed to tobacco smoke. She has never used smokeless tobacco. She  reports that she does not drink alcohol or use drugs.   Family History   Her family history includes Breast cancer in an other family member; Diabetes type II in an other family member; Heart disease in her father.   Allergies Allergies  Allergen Reactions  . Amoxicillin-Pot Clavulanate Diarrhea  . Ibuprofen Other (See Comments)    Per MD she should no longer take, unsure why     Home Medications  Prior to Admission medications   Medication Sig Start Date End Date Taking? Authorizing Provider  acetaminophen (TYLENOL) 500 MG tablet Take 1,000 mg by mouth every 6 (six) hours as needed for moderate pain or headache.    [provider]  albuterol (PROVENTIL HFA;VENTOLIN HFA) 108 (90 Base) MCG/ACT inhaler Inhale 1-2 puffs into the lungs every 6 (six) hours as needed for wheezing or shortness of breath. Patient taking differently: Inhale 2 puffs into the lungs every 6 (six) hours as needed for wheezing or shortness of breath.  02/10/15   Drenda Freeze, MD  aspirin EC 81 MG tablet Take 1 tablet (81 mg total) by mouth daily. 05/18/16   Minus Breeding, MD  azithromycin (ZITHROMAX) 250 MG tablet  06/02/17   [provider]  benzonatate (TESSALON) 200 MG capsule Take 1 capsule (200 mg total) by mouth 3 (three) times daily. 11/19/18   Tedd Sias, PA  Calcium-Phosphorus-Vitamin D (CALCIUM GUMMIES PO) Take 2 tablets by mouth daily.    [provider]  carvedilol (COREG) 25 MG tablet TAKE 1 TABLET BY MOUTH TWICE DAILY WITH A MEAL 07/11/18   Minus Breeding, MD  Cholecalciferol (VITAMIN D-3) 5000 units TABS Take 5,000 Units by mouth daily.    [provider]  ciclopirox (PENLAC) 8 % solution Apply topically at bedtime. Apply over nail and surrounding skin. Apply daily over previous coat. Remove weekly with polish remover. 06/17/17   Marzetta Board, DPM  ELDERBERRY PO Take 1 tablet by mouth 2 (two) times daily.    [provider]  fexofenadine (ALLEGRA) 180 MG tablet Take 180 mg by mouth daily.    [provider]  furosemide (LASIX) 40 MG tablet Take 1 tablet (40 mg total) by mouth daily. Patient taking differently: Take 40 mg by mouth 2 (two) times daily.  06/14/16   Minus Breeding, MD  GAVILYTE-G 236 g solution  06/16/17   [provider]  glucose blood (BAYER CONTOUR NEXT TEST) test strip as directed. 05/11/16   [provider]  insulin aspart (NOVOLOG) 100 UNIT/ML injection Use approximately 200 units via insulin pump  DX E11.9   And Z96.41 03/29/16   [provider]  lidocaine (LIDODERM) 5 % Place 3 patches onto the skin daily. Remove & Discard patch within 12 hours or as directed by MD 11/18/14   Melvenia Beam, MD  lisinopril (PRINIVIL,ZESTRIL) 10 MG tablet Take 1 tablet (10 mg total) by mouth daily. PLEASE CONTACT OFFICE FOR ADDITIONAL REFILLS 12/19/15   Minus Breeding, MD  metFORMIN (GLUCOPHAGE) 1000 MG tablet Take 1,000 mg by mouth 2 (two) times daily with a meal.    [provider]  potassium chloride SA (K-DUR,KLOR-CON) 20 MEQ tablet Take 1 tablet (20 mEq total) by mouth daily. 06/14/16   Minus Breeding, MD  rosuvastatin (CRESTOR) 20 MG tablet Take 20 mg by mouth every evening. 12/13/14 06/14/17  [provider]     Critical care time: 60 minutes    Erick Colace ACNP-BC [place] Pager #  [phone] OR # [phone] if no  answer

## 2019-01-16 NOTE — Progress Notes (Signed)
Initial Nutrition Assessment RD working remotely.  DOCUMENTATION CODES:   Morbid obesity  INTERVENTION:    Vital 1.5 at 25 ml/h (600 ml per day)   Pro-stat 60 ml TID   Provides 1500 kcal (2329 kcal total with Propofol), 131 gm protein, 458 ml free water daily  NUTRITION DIAGNOSIS:   Increased nutrient needs related to acute illness(COVID 19) as evidenced by estimated needs.  GOAL:   Patient will meet greater than or equal to 90% of their needs  MONITOR:   Vent status, TF tolerance, Labs, I & O's  REASON FOR ASSESSMENT:   Ventilator, Consult Enteral/tube feeding initiation and management  ASSESSMENT:   54 yo female admitted with COVID PNA on 1/1. Developed progressive hypoxemia and required intubation 1/4. PMH includes DM, HLD, obesity, anemia, sickle cell trait, HTN, NICM, CHF, neuropathy.   PO intake since admission up until intubation has been very poor, consuming 35% of meals.  Received MD Consult for TF initiation and management. OG tube in place.   Patient is currently intubated on ventilator support, plans to begin proning today.  MV: 9.5 L/min Temp (24hrs), Avg:99 F (37.2 C), Min:97.8 F (36.6 C), Max:99.4 F (37.4 C)  Propofol: 31.4 ml/hr providing 829 kcal per day from lipid   Labs reviewed.  CBG's: [phone]  Medications reviewed and include vitamin C, lasix, novolog, levemir, vitamin D3, zinc sulfate, propofol.   NUTRITION - FOCUSED PHYSICAL EXAM:  unable to complete  Diet Order:   Diet Order            Diet NPO time specified  Diet effective now              EDUCATION NEEDS:   Not appropriate for education at this time  Skin:  Skin Assessment: Reviewed RN Assessment  Last BM:  1/2  Height:   Ht Readings from Last 1 Encounters:  01/13/19 5\' 9"  (1.753 m)    Weight:   Wt Readings from Last 1 Encounters:  01/13/19 (!) 178.2 kg    Ideal Body Weight:  65.9 kg  BMI:  Body mass index is 58.02 kg/m.  Estimated  Nutritional Needs:   Kcal:  [id number]  Protein:  132 gm  Fluid:  >/= 1.9 L    Molli Barrows, RD, LDN, [place] Pager [phone] After Hours Pager [phone]

## 2019-01-16 NOTE — Progress Notes (Signed)
Call to the patient bedside at around 6:30 pm patient sleepy but arousable to sternal rub. Transfer to the ICU, placed on a nonrebreather and nasal canula and she was persistently lethargic. Breathing about 30-35 times per minutes and sat remaines in the low to mid 80's.PCCM consulted. Order ABG gas an it showed hypercarbia. CRNA at bedside for intubation.

## 2019-01-16 NOTE — Progress Notes (Signed)
  Echocardiogram 2D Echocardiogram has been performed.  Kaitlin [name] 01/16/2019, 2:41 PM

## 2019-01-16 NOTE — Procedures (Signed)
Arterial Catheter Insertion Procedure Note Kaitlin [name] [id number] 08/14/1965  Procedure: Insertion of Arterial Catheter  Indications: Blood pressure monitoring  Procedure Details Consent: Unable to obtain consent because of emergent medical necessity. Pt intubated Time Out: Verified patient identification, verified procedure, site/side was marked, verified correct patient position, special equipment/implants available, medications/allergies/relevent history reviewed, required imaging and test results available.  Performed  Maximum sterile technique was used including antiseptics, cap, gloves, gown, hand hygiene, mask and sheet. Skin prep: Chlorhexidine; local anesthetic administered 20 gauge catheter was inserted into right radial artery using the Seldinger technique. ULTRASOUND GUIDANCE USED: NO Evaluation Blood flow good; BP tracing good. Complications: No apparent complications.   Sharla Kidney 01/16/2019

## 2019-01-17 DIAGNOSIS — I959 Hypotension, unspecified: Secondary | ICD-10-CM

## 2019-01-17 LAB — CBC WITH DIFFERENTIAL/PLATELET
Abs Immature Granulocytes: 0.05 10*3/uL (ref 0.00–0.07)
Basophils Absolute: 0 10*3/uL (ref 0.0–0.1)
Basophils Relative: 0 %
Eosinophils Absolute: 0 10*3/uL (ref 0.0–0.5)
Eosinophils Relative: 0 %
HCT: 41.6 % (ref 36.0–46.0)
Hemoglobin: 13.5 g/dL (ref 12.0–15.0)
Immature Granulocytes: 1 %
Lymphocytes Relative: 30 %
Lymphs Abs: 3.2 10*3/uL (ref 0.7–4.0)
MCH: 24.9 pg — ABNORMAL LOW (ref 26.0–34.0)
MCHC: 32.5 g/dL (ref 30.0–36.0)
MCV: 76.6 fL — ABNORMAL LOW (ref 80.0–100.0)
Monocytes Absolute: 0.3 10*3/uL (ref 0.1–1.0)
Monocytes Relative: 3 %
Neutro Abs: 7.2 10*3/uL (ref 1.7–7.7)
Neutrophils Relative %: 66 %
Platelets: 472 10*3/uL — ABNORMAL HIGH (ref 150–400)
RBC: 5.43 MIL/uL — ABNORMAL HIGH (ref 3.87–5.11)
RDW: 17.6 % — ABNORMAL HIGH (ref 11.5–15.5)
WBC: 10.8 10*3/uL — ABNORMAL HIGH (ref 4.0–10.5)
nRBC: 0.3 % — ABNORMAL HIGH (ref 0.0–0.2)

## 2019-01-17 LAB — COMPREHENSIVE METABOLIC PANEL
ALT: 31 U/L (ref 0–44)
AST: 42 U/L — ABNORMAL HIGH (ref 15–41)
Albumin: 2.8 g/dL — ABNORMAL LOW (ref 3.5–5.0)
Alkaline Phosphatase: 81 U/L (ref 38–126)
Anion gap: 11 (ref 5–15)
BUN: 27 mg/dL — ABNORMAL HIGH (ref 6–20)
CO2: 32 mmol/L (ref 22–32)
Calcium: 8.5 mg/dL — ABNORMAL LOW (ref 8.9–10.3)
Chloride: 100 mmol/L (ref 98–111)
Creatinine, Ser: 0.89 mg/dL (ref 0.44–1.00)
GFR calc Af Amer: >60 mL/min (ref >60)
GFR calc non Af Amer: >60 mL/min (ref >60)
Glucose, Bld: 120 mg/dL — ABNORMAL HIGH (ref 70–99)
Potassium: 3.8 mmol/L (ref 3.5–5.1)
Sodium: 143 mmol/L (ref 135–145)
Total Bilirubin: 0.6 mg/dL (ref 0.3–1.2)
Total Protein: 6.2 g/dL — ABNORMAL LOW (ref 6.5–8.1)

## 2019-01-17 LAB — POCT I-STAT 7, (LYTES, BLD GAS, ICA,H+H)
Acid-Base Excess: 8 mmol/L — ABNORMAL HIGH (ref 0.0–2.0)
Bicarbonate: 33.4 mmol/L — ABNORMAL HIGH (ref 20.0–28.0)
Calcium, Ion: 1.13 mmol/L — ABNORMAL LOW (ref 1.15–1.40)
HCT: 44 % (ref 36.0–46.0)
Hemoglobin: 15 g/dL (ref 12.0–15.0)
O2 Saturation: 98 %
Potassium: 3.8 mmol/L (ref 3.5–5.1)
Sodium: 143 mmol/L (ref 135–145)
TCO2: 35 mmol/L — ABNORMAL HIGH (ref 22–32)
pCO2 arterial: 49.3 mmHg — ABNORMAL HIGH (ref 32.0–48.0)
pH, Arterial: 7.439 (ref 7.350–7.450)
pO2, Arterial: 101 mmHg (ref 83.0–108.0)

## 2019-01-17 LAB — GLUCOSE, CAPILLARY
Glucose-Capillary: 115 mg/dL — ABNORMAL HIGH (ref 70–99)
Glucose-Capillary: 130 mg/dL — ABNORMAL HIGH (ref 70–99)
Glucose-Capillary: 205 mg/dL — ABNORMAL HIGH (ref 70–99)
Glucose-Capillary: 234 mg/dL — ABNORMAL HIGH (ref 70–99)
Glucose-Capillary: 314 mg/dL — ABNORMAL HIGH (ref 70–99)
Glucose-Capillary: 83 mg/dL (ref 70–99)

## 2019-01-17 LAB — CULTURE, BLOOD (ROUTINE X 2)
Culture: NO GROWTH
Culture: NO GROWTH
Special Requests: ADEQUATE
Special Requests: ADEQUATE

## 2019-01-17 LAB — D-DIMER, QUANTITATIVE: D-Dimer, Quant: 3.66 ug/mL-FEU — ABNORMAL HIGH (ref 0.00–0.50)

## 2019-01-17 LAB — PHOSPHORUS
Phosphorus: 4.1 mg/dL (ref 2.5–4.6)
Phosphorus: 4.4 mg/dL (ref 2.5–4.6)

## 2019-01-17 LAB — BPAM FFP
Blood Product Expiration Date: 202101051645
ISSUE DATE / TIME: 202101050101
Unit Type and Rh: 6200

## 2019-01-17 LAB — MAGNESIUM
Magnesium: 2.2 mg/dL (ref 1.7–2.4)
Magnesium: 2.1 mg/dL (ref 1.7–2.4)

## 2019-01-17 LAB — PREPARE FRESH FROZEN PLASMA

## 2019-01-17 LAB — C-REACTIVE PROTEIN: CRP: 3.8 mg/dL — ABNORMAL HIGH (ref <1.0)

## 2019-01-17 LAB — PROCALCITONIN: Procalcitonin: <0.1 ng/mL

## 2019-01-17 LAB — TRIGLYCERIDES: Triglycerides: 304 mg/dL — ABNORMAL HIGH (ref <150)

## 2019-01-17 MED ORDER — STERILE WATER FOR INJECTION IJ SOLN
INTRAMUSCULAR | Status: AC
  Filled 2019-01-17: qty 10

## 2019-01-17 MED ORDER — MIDAZOLAM BOLUS VIA INFUSION
1.0000 mg | INTRAVENOUS | Status: DC | PRN
  Filled 2019-01-17: qty 2

## 2019-01-17 MED ORDER — MIDAZOLAM 50MG/50ML (1MG/ML) PREMIX INFUSION
0.0000 mg/h | INTRAVENOUS | Status: DC
  Administered 2019-01-17: 18:00:00 4 mg/h via INTRAVENOUS
  Administered 2019-01-17: 3 mg/h via INTRAVENOUS
  Administered 2019-01-18: 6 mg/h via INTRAVENOUS
  Administered 2019-01-18: 10 mg/h via INTRAVENOUS
  Administered 2019-01-18: 8 mg/h via INTRAVENOUS
  Administered 2019-01-19 – 2019-01-21 (×12): 10 mg/h via INTRAVENOUS
  Administered 2019-01-21: 9 mg/h via INTRAVENOUS
  Administered 2019-01-22: 10 mg/h via INTRAVENOUS
  Administered 2019-01-22: 8 mg/h via INTRAVENOUS
  Administered 2019-01-22: 9 mg/h via INTRAVENOUS
  Administered 2019-01-23: 8 mg/h via INTRAVENOUS
  Administered 2019-01-23: 4 mg/h via INTRAVENOUS
  Administered 2019-01-23: 8 mg/h via INTRAVENOUS
  Administered 2019-01-24: 4 mg/h via INTRAVENOUS
  Filled 2019-01-17 (×29): qty 50

## 2019-01-17 MED ORDER — PHENYLEPHRINE CONCENTRATED 100MG/250ML (0.4 MG/ML) INFUSION SIMPLE
0.0000 ug/min | INTRAVENOUS | Status: DC
  Administered 2019-01-17: 80 ug/min via INTRAVENOUS
  Administered 2019-01-17: 160 ug/min via INTRAVENOUS
  Filled 2019-01-17 (×4): qty 250

## 2019-01-17 MED ORDER — POTASSIUM CHLORIDE 20 MEQ/15ML (10%) PO SOLN
40.0000 meq | Freq: Once | ORAL | Status: AC
  Administered 2019-01-17: 10:00:00 40 meq
  Filled 2019-01-17: qty 30

## 2019-01-17 NOTE — Procedures (Signed)
Cortrak  Person Inserting Tube:  Kaitlin [name], RD Tube Type:  Cortrak - 43 inches Tube Location:  Left nare Initial Placement:  Postpyloric Secured by: Bridle Technique Used to Measure Tube Placement:  Documented cm marking at nare/ corner of mouth Cortrak Secured At:  90 cm    Cortrak Tube Team Note:  Consult received to place a Cortrak feeding tube.   No x-ray is required. RN may begin using tube.   If the tube becomes dislodged please keep the tube and contact the Cortrak team at [email or web address] (password [email or web address]) for replacement.  If after hours and replacement cannot be delayed, place a NG tube and confirm placement with an abdominal x-ray.    Dania [place], [place], [place] Pager [phone] After Hours Pager

## 2019-01-17 NOTE — Progress Notes (Addendum)
[facility] PROGRESS NOTE    Progress Note  KARLENA LUEBKE  EAV:[id number] DOB: July 14, 1965 DOA: 01/12/2019 PCP: Olga Coaster, [facility]     Brief Narrative:   Kaitlin [name] is an 54 y.o. 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  In the ED she was found to be hypoxic with a chest x-ray with bilateral infiltrates.  Assessment/Plan:   Acute respiratory failure with hypoxia secondarily to Pneumonia due to COVID-19 virus:  Recent Labs  Lab 01/12/19 1838 01/13/19 0823 01/14/19 0147 01/14/19 0500 01/15/19 0135 01/15/19 0343 01/16/19 0545 01/17/19 0500  DDIMER 1.54* 1.29* 1.23*  --  1.60*  --  2.52* 3.66*  FERRITIN 473*  --   --   --   --   --   --   --   CRP 19.5* 19.3*  --  15.8* 8.2*  --  4.2* 3.8*  ALT 37 33 37  --  38  --  33 31  PROCALCITON <0.10 0.10  --   --   --  <0.10 <0.10 <0.10    Patient was hospitalized.  She was placed on remdesivir and steroids.  She also received 1 dose of Actemra.  She was also covered empirically with vancomycin and cefepime due to concern for secondary bacterial infection.  Continues to be on steroids.  Inflammatory markers improving.  D-dimer noted to be slightly higher today at 3.66.  Continue to trend. On 1/5 patient noted to be encephalopathic and more somnolent.  She was requiring 15 L of oxygen.  Pulmonology was consulted.  Patient was transferred to the intensive care unit.  Subsequently she was intubated on 01/16/2019. Patient is currently in prone position.  Pulmonology is following and managing.  Patient remains on scheduled furosemide.  Acute on chronic systolic and diastolic congestive heart failure Echocardiogram done yesterday shows EF of 45%. Patient with known  diastolic dysfunction.  Continue furosemide.  Ins and outs.  Daily weights.  Hypotension Patient noted to be on carvedilol.  However she has been requiring Neo-Synephrine since intubation.  Most likely due to sedation.  We will stop her carvedilol for now.    Insulin-dependent diabetes mellitus type 2: Patient on insulin pump at home.  Her blood glucose levels were very poorly controlled.  Her insulin doses were increased.  Over the last 12 hours her glucose levels have decreased most likely due to n.p.o. status.  Anticipate that it will again start climbing once she is placed on tube feedings.  HbA1c 7.1.  HLD (hyperlipidemia): Continue statins.  Morbid obesity Estimated body mass index is 58.02 kg/m as calculated from the following:   Height as of this encounter: _0  (1.753 m).   Weight as of this encounter: 178.2 kg.    DVT prophylaxis: On Lovenox 85 mg every 12 hours CODE STATUS: Full code Family Communication: PCCM is updating family Disposition Plan: Remain in ICU for now.  Disposition will depend on her clinical course here in the hospital.    IV Access:    Peripheral IV   Procedures and diagnostic studies:   Transthoracic echocardiogram 01/16/2019  1. Left ventricular ejection fraction, by visual estimation, is 45 to 50%. The left ventricle has mildly decreased function. There is moderately increased left ventricular hypertrophy.  2. Left ventricular diastolic parameters  are indeterminate.  3. Global right ventricle was not well visualized.The right ventricular size is not well visualized. Right vetricular wall thickness was not assessed.  4. Left atrial size was normal.  5. The mitral valve is normal in structure. Trivial mitral valve regurgitation. No evidence of mitral stenosis.  6. The tricuspid valve is not well visualized.  7. The aortic valve was not well visualized. Aortic valve regurgitation is not visualized. No evidence of aortic valve sclerosis or  stenosis.  8. The pulmonic valve was not well visualized. Pulmonic valve regurgitation is trivial.   Radiology Reports DG CHEST PORT 1 VIEW  Result Date: 01/16/2019 CLINICAL DATA:  54 year old female status post central line placement. EXAM: PORTABLE CHEST 1 VIEW COMPARISON:  Chest x-ray 01/15/2019. FINDINGS: There is a new left-sided internal jugular central venous catheter with tip terminating in the right atrium. An endotracheal tube is in place with tip 3.0 cm above the carina. A nasogastric tube is seen extending into the stomach, however, the tip of the nasogastric tube extends below the lower margin of the image. Status post median sternotomy. Lung volumes are low. No pneumothorax. Patchy multifocal interstitial and airspace disease throughout the lungs bilaterally, asymmetrically distributed (right greater than left). Overall, aeration has slightly improved compared to the prior study, particularly in the left upper lobe. Small bilateral pleural effusions (left greater than right), similar to the prior study. No pneumothorax. Pulmonary vasculature is obscured. Heart size is borderline enlarged. Upper mediastinal contours are within normal limits. IMPRESSION: 1. Support apparatus, as above. 2. Multilobar bilateral pneumonia (right greater than left), with slightly improved aeration compared to the prior study. 3. Small bilateral pleural effusions (left greater than right), similar. Electronically Signed   By: Vinnie Langton M.D.   On: 01/16/2019 13:08   DG CHEST PORT 1 VIEW  Result Date: 01/15/2019 CLINICAL DATA:  ETT placement, COVID positive EXAM: PORTABLE CHEST 1 VIEW COMPARISON:  01/15/2019 FINDINGS: Endotracheal tube terminates 3 cm above the carina. Gastric tube courses through in off the field of the radiograph. Cardiomediastinal contours are stable and likely enlarged. Changes of median sternotomy are noted. Diffuse interstitial and airspace opacities appear stable accounting for  differences in technique remaining severe and worse on the right. Visualized skeletal structures with limited assessment are unremarkable. IMPRESSION: 1. Diffuse interstitial and airspace opacities with diminished lung volumes otherwise no change following intubation. 2. Endotracheal tube 3 cm above the carina. 3. Gastric tube courses through off the field of the radiograph. Electronically Signed   By: Zetta Bills M.D.   On: 01/15/2019 19:56   ECHOCARDIOGRAM COMPLETE  Result Date: 01/16/2019   ECHOCARDIOGRAM REPORT   Patient Name:   SIANNE TEJADA Date of Exam: 01/16/2019 Medical Rec #:  [id number]       Height:       69.0 in Accession #:    [id number]      Weight:       392.9 lb Date of Birth:  11/26/1965        BSA:          2.75 m Patient Age:    54 years        BP:           102/81 mmHg Patient Gender: F               HR:           90 bpm. Exam Location:  Inpatient Procedure: 2D Echo, Cardiac Doppler and Color Doppler Indications:  Acute respiratory failure  History:        Patient has prior history of Echocardiogram examinations, most                 recent 05/31/2016. Signs/Symptoms:Dyspnea; Risk                 Factors:Hypertension, Diabetes and Dyslipidemia. Covid 19                 positive.  Sonographer:    Roseanna Rainbow RDCS Referring Phys: [id number] PETER E BABCOCK  Sonographer Comments: Technically difficult study due to poor echo windows, patient is morbidly obese and echo performed with patient supine and on artificial respirator. Image acquisition challenging due to patient body habitus. IMPRESSIONS  1. Left ventricular ejection fraction, by visual estimation, is 45 to 50%. The left ventricle has mildly decreased function. There is moderately increased left ventricular hypertrophy.  2. Left ventricular diastolic parameters are indeterminate.  3. Global right ventricle was not well visualized.The right ventricular size is not well visualized. Right vetricular wall thickness was not assessed.  4. Left  atrial size was normal.  5. The mitral valve is normal in structure. Trivial mitral valve regurgitation. No evidence of mitral stenosis.  6. The tricuspid valve is not well visualized.  7. The aortic valve was not well visualized. Aortic valve regurgitation is not visualized. No evidence of aortic valve sclerosis or stenosis.  8. The pulmonic valve was not well visualized. Pulmonic valve regurgitation is trivial. FINDINGS  Left Ventricle: Left ventricular ejection fraction, by visual estimation, is 45 to 50%. The left ventricle has mildly decreased function. The left ventricle is not well visualized. There is moderately increased left ventricular hypertrophy. Left ventricular diastolic parameters are indeterminate. Right Ventricle: The right ventricular size is not well visualized. Right vetricular wall thickness was not assessed. Global RV systolic function is was not well visualized. Left Atrium: Left atrial size was normal in size. Right Atrium: Right atrial size was not well visualized Pericardium: There is no evidence of pericardial effusion. Mitral Valve: The mitral valve is normal in structure. Trivial mitral valve regurgitation. No evidence of mitral valve stenosis by observation. Tricuspid Valve: The tricuspid valve is not well visualized. Tricuspid valve regurgitation is trivial. Aortic Valve: The aortic valve was not well visualized. Aortic valve regurgitation is not visualized. The aortic valve is structurally normal, with no evidence of sclerosis or stenosis. Pulmonic Valve: The pulmonic valve was not well visualized. Pulmonic valve regurgitation is trivial. Pulmonic regurgitation is trivial. Aorta: The aortic root is normal in size and structure, the ascending aorta was not well visualized and the aortic arch was not well visualized. Venous: IVC assessment for right atrial pressure unable to be performed due to mechanical ventilation. IAS/Shunts: The interatrial septum was not well visualized.  LEFT  VENTRICLE PLAX 2D LVIDd:         3.70 cm       Diastology LVIDs:         2.68 cm       LV e' lateral:   8.70 cm/s LV PW:         1.51 cm       LV E/e' lateral: 7.8 LV IVS:        1.62 cm       LV e' medial:    3.81 cm/s LVOT diam:     2.00 cm       LV E/e' medial:  17.8 LV SV:  32 ml LV SV Index:   10.35 LVOT Area:     3.14 cm  LV Volumes (MOD) LV area d, A2C:    33.40 cm LV area d, A4C:    25.60 cm LV area s, A2C:    21.80 cm LV area s, A4C:    19.50 cm LV major d, A2C:   8.51 cm LV major d, A4C:   7.44 cm LV major s, A2C:   8.12 cm LV major s, A4C:   7.04 cm LV vol d, MOD A2C: 112.0 ml LV vol d, MOD A4C: 73.7 ml LV vol s, MOD A2C: 51.4 ml LV vol s, MOD A4C: 45.3 ml LV SV MOD A2C:     60.6 ml LV SV MOD A4C:     73.7 ml LV SV MOD BP:      45.7 ml IVC IVC diam: 2.03 cm LEFT ATRIUM             Index       RIGHT ATRIUM           Index LA diam:        2.80 cm 1.02 cm/m  RA Area:     11.00 cm LA Vol (A2C):   34.0 ml 12.35 ml/m RA Volume:   26.60 ml  9.66 ml/m LA Vol (A4C):   31.8 ml 11.55 ml/m LA Biplane Vol: 34.6 ml 12.57 ml/m  AORTIC VALVE             PULMONIC VALVE LVOT Vmax:   82.80 cm/s  PR End Diast Vel: 1.44 msec LVOT Vmean:  56.700 cm/s LVOT VTI:    0.163 m  AORTA Ao Root diam: 2.90 cm MITRAL VALVE MV Area (PHT): 5.13 cm             SHUNTS MV PHT:        42.92 msec           Systemic VTI:  0.16 m MV Decel Time: 148 msec             Systemic Diam: 2.00 cm MV E velocity: 67.90 cm/s 103 cm/s MV A velocity: 89.70 cm/s 70.3 cm/s MV E/A ratio:  0.76       1.5  Cherlynn Kaiser MD Electronically signed by Cherlynn Kaiser MD Signature Date/Time: 01/16/2019/9:51:32 PM    Final      Medical Consultants:    Pulmonology.  Anti-Infectives:   Anti-infectives (From admission, onward)   Start     Dose/Rate Route Frequency Ordered Stop   01/16/19 1100  vancomycin (VANCOREADY) IVPB 1500 mg/300 mL     1,500 mg 150 mL/hr over 120 Minutes Intravenous Every 12 hours 01/15/19 1633     01/16/19 0000   vancomycin (VANCOCIN) IVPB 1000 mg/200 mL premix     1,000 mg 200 mL/hr over 60 Minutes Intravenous  Once 01/15/19 2318 01/16/19 1436   01/15/19 1700  ceFEPIme (MAXIPIME) 2 g in sodium chloride 0.9 % 100 mL IVPB     2 g 200 mL/hr over 30 Minutes Intravenous Every 8 hours 01/15/19 1634     01/15/19 1645  vancomycin (VANCOCIN) 2,500 mg in sodium chloride 0.9 % 500 mL IVPB  Status:  Discontinued     2,500 mg 250 mL/hr over 120 Minutes Intravenous  Once 01/15/19 1633 01/16/19 1435   01/14/19 1000  remdesivir 100 mg in sodium chloride 0.9 % 100 mL IVPB     100 mg 200 mL/hr over 30 Minutes Intravenous Daily 01/12/19 2341  01/17/19 0934   01/13/19 0300  remdesivir 200 mg in sodium chloride 0.9% 250 mL IVPB     200 mg 580 mL/hr over 30 Minutes Intravenous Once 01/12/19 2341 01/13/19 0715       Subjective:    Patient is intubated and sedated.  Currently in prone position.   Objective:    Vitals:   01/17/19 0745 01/17/19 0800 01/17/19 0815 01/17/19 0830  BP:      Pulse: 93 92 91 88  Resp: (!) 24 (!) 24 (!) 24 (!) 24  Temp:    98.6 F (37 C)  TempSrc:    Axillary  SpO2: 100% 100% 100% 100%  Weight:      Height:       SpO2: 100 % O2 Flow Rate (L/min): 15 L/min FiO2 (%): 100 %   Intake/Output Summary (Last 24 hours) at 01/17/2019 1204 Last data filed at 01/17/2019 0958 Gross per 24 hour  Intake 3998.73 ml  Output 5750 ml  Net -1751.27 ml   Filed Weights   01/13/19 0410 01/13/19 0413  Weight: (!) 178.2 kg (!) 178.2 kg    General appearance: Intubated sedated.  Obese. Resp: Coarse breath sound bilaterally.  Few crackles at the bases. Cardio: Difficult to examine due to prone position as well as body habitus. GI: Unable to palpate abdomen as she is in prone position Extremities: No obvious edema noted Neurologic: Intubated and sedated   Data Reviewed:    Labs: Basic Metabolic Panel: Recent Labs  Lab 01/13/19 0823 01/14/19 0147 01/15/19 0135 01/15/19 2126  01/16/19 0545 01/16/19 0718 01/16/19 1604 01/16/19 2040 01/16/19 2320 01/17/19 0500  NA 141 140 141 136 138  --  138  --  141 143  K 4.1 4.5 4.6 4.8 4.5  --  4.3  --  4.0 3.8  CL 104 103 103  --  97*  --   --   --   --  100  CO2 _0 --  29  --   --   --   --  32  GLUCOSE 149* 305* 235*  --  256*  --   --   --   --  120*  BUN 17 23* 21*  --  23*  --   --   --   --  27*  CREATININE 0.76 0.89 0.86  --  1.05*  --   --   --   --  0.89  CALCIUM 8.4* 8.8* 8.9  --  8.8*  --   --   --   --  8.5*  MG 2.2  --   --   --   --  2.1  --  2.2  --  2.2  PHOS  --   --   --   --   --  3.5  --  3.9  --  4.1   GFR Estimated Creatinine Clearance: 131 mL/min (by C-G formula based on SCr of 0.89 mg/dL). Liver Function Tests: Recent Labs  Lab 01/13/19 0823 01/14/19 0147 01/15/19 0135 01/16/19 0545 01/17/19 0500  AST 52* 47* 43* 45* 42*  ALT 33 37 38 33 31  ALKPHOS 57 61 64 72 81  BILITOT 0.6 0.7 0.4 0.4 0.6  PROT 6.4* 6.6 6.7 6.3* 6.2*  ALBUMIN 2.6* 2.9* 2.7* 2.8* 2.8*   COVID-19 Labs  Recent Labs    01/15/19 0135 01/16/19 0545 01/17/19 0500  DDIMER 1.60* 2.52* 3.66*  CRP 8.2* 4.2* 3.8*    Lab  Results  Component Value Date   [facility] NEGATIVE 11/19/2018    CBC: Recent Labs  Lab 01/13/19 0823 01/14/19 0147 01/15/19 0135 01/15/19 2126 01/16/19 0545 01/16/19 1604 01/16/19 2320 01/17/19 0500  WBC 6.1 7.5 7.5  --  7.5  --   --  10.8*  NEUTROABS 4.6 5.6 4.8  --  5.1  --   --  7.2  HGB 11.6* 12.7 13.6 13.9 13.8 13.3 14.6 13.5  HCT 35.4* 38.9 41.6 41.0 41.8 39.0 43.0 41.6  MCV 77.1* 76.6* 77.0*  --  76.0*  --   --  76.6*  PLT 207 254 305  --  363  --   --  472*   CBG: Recent Labs  Lab 01/16/19 1748 01/16/19 2059 01/17/19 0042 01/17/19 0451 01/17/19 0851  GLUCAP 297* 236* 205* 115* 83   D-Dimer: Recent Labs    01/16/19 0545 01/17/19 0500  DDIMER 2.52* 3.66*   Lipid Profile: Recent Labs    01/16/19 0545 01/17/19 0500  TRIG 228* 304*   Sepsis  Labs: Recent Labs  Lab 01/12/19 1838 01/13/19 0823 01/14/19 0147 01/15/19 0135 01/15/19 0343 01/16/19 0545 01/17/19 0500  PROCALCITON <0.10 0.10  --   --  <0.10 <0.10 <0.10  WBC 8.7 6.1 7.5 7.5  --  7.5 10.8*  LATICACIDVEN 1.1  --   --   --   --   --   --    Microbiology Recent Results (from the past 240 hour(s))  Blood Culture (routine x 2)     Status: None (Preliminary result)   Collection Time: 01/12/19  6:38 PM   Specimen: BLOOD  Result Value Ref Range Status   Specimen Description   Final    BLOOD RIGHT ANTECUBITAL Performed at [facility], [address] [place],  [address]    Special Requests   Final    BOTTLES DRAWN AEROBIC AND ANAEROBIC Blood Culture adequate volume Performed at [facility], [address] [place], [place] [address]    Culture   Final    NO GROWTH 4 DAYS Performed at [facility], [place] [address], [place], [place] [address]    Report Status PENDING  Incomplete  SARS Coronavirus 2 Ag (30 min TAT) - Nasal Swab (BD Veritor Kit)     Status: Abnormal   Collection Time: 01/12/19  6:38 PM   Specimen: Nasal Swab (BD Veritor Kit)  Result Value Ref Range Status   SARS Coronavirus 2 Ag POSITIVE (A) NEGATIVE Final    Comment: RESULT CALLED TO, READ BACK BY AND VERIFIED WITH: Marsa Aris RN AT 1942 ON 01/12/19 BY [email or web address] (NOTE) SARS-CoV-2 antigen PRESENT. Positive results indicate the presence of viral antigens, but clinical correlation with patient history and other diagnostic information is necessary to determine patient infection status.  Positive results do not rule out bacterial infection or co-infection  with other viruses. False positive results are rare but can occur, and confirmatory RT-PCR testing may be appropriate in some circumstances. The expected result is Negative. Fact Sheet for Patients: PodPark.tn Fact Sheet for Providers:  GiftContent.is  This test is not yet approved or cleared by the [place] FDA and  has been authorized for detection and/or diagnosis of SARS-CoV-2 by FDA under an Emergency Use Authorization (EUA).  This EUA will remain in effect (meaning this test can be used) for the duration of  the COVID-19  declaration under Section 564(b)(1) of the Act, 21 U.S.C. section 360bbb-3(b)(1), unless the  authorization is terminated or revoked sooner. Performed at [facility], [address] [place], [place] [address]   Blood Culture (routine x 2)     Status: None (Preliminary result)   Collection Time: 01/12/19  7:36 PM   Specimen: BLOOD  Result Value Ref Range Status   Specimen Description   Final    BLOOD LEFT ANTECUBITAL Performed at [facility], [address] [place], [place] [address]    Special Requests   Final    BOTTLES DRAWN AEROBIC AND ANAEROBIC Blood Culture adequate volume Performed at [facility], [address] [place], [place] [address]    Culture   Final    NO GROWTH 4 DAYS Performed at [facility], [place] [address] [place], [place] [address]    Report Status PENDING  Incomplete     Medications:   . sterile water (preservative free)      . artificial tears  1 application Both Eyes E7O  . vitamin C  500 mg Per Tube Daily  . aspirin  81 mg Per Tube Daily  . chlorhexidine  15 mL Mouth/Throat BID  . Chlorhexidine Gluconate Cloth  6 each Topical Daily  . dexamethasone  6 mg Per Tube Daily  . enoxaparin (LOVENOX) injection  85 mg Subcutaneous Q12H  . famotidine  20 mg Per Tube BID  . feeding supplement (PRO-STAT SUGAR FREE 64)  60 mL Per Tube TID  . feeding supplement (VITAL 1.5 CAL)  1,000 mL Per Tube Q24H  . furosemide  80 mg Intravenous Q12H  . insulin aspart  0-15 Units Subcutaneous Q4H  . insulin aspart  10 Units Subcutaneous Q4H  . ipratropium-albuterol  3 mL Nebulization Q6H  . mouth rinse  15  mL Mouth Rinse 10 times per day  . rosuvastatin  20 mg Per Tube QPM  . cholecalciferol  1,000 Units Per Tube Daily  . zinc sulfate  220 mg Per Tube Daily   Continuous Infusions: . ceFEPime (MAXIPIME) IV 2 g (01/17/19 0939)  . fentaNYL infusion INTRAVENOUS 100 mcg/hr (01/17/19 0300)  . midazolam    . phenylephrine (NEO-SYNEPHRINE) Adult infusion 80 mcg/min (01/17/19 1915)  . vancomycin 1,500 mg (01/17/19 0948)     LOS: 5 days   Bonnielee Haff  [facility]  01/17/2019, 12:04 PM

## 2019-01-17 NOTE — Progress Notes (Signed)
RT NOTE:  Pt proned at this time. ETT taped and secured @ 22cm w/ cloth tape. Foam padding placed on cheeks and upper lip. Head turned to the left.

## 2019-01-17 NOTE — Progress Notes (Signed)
Arterial line replaced per CCM MD order.

## 2019-01-17 NOTE — Progress Notes (Signed)
RTnotified by RN that the pt arterial line has come out. RT will let CCM MD know.

## 2019-01-17 NOTE — Progress Notes (Signed)
NAME:  Kaitlin [name], MRN:  [id number], DOB:  May 31, 1965, LOS: 5 ADMISSION DATE:  01/12/2019, CONSULTATION DATE:  11/4 REFERRING MD:  Olevia Bowens, CHIEF COMPLAINT: Respiratory failure and ventilator management  Brief History   54 year old female admitted 1/1 with Covid pneumonia.  Had initially been improving over hospital course from 1/1 through 1/4 after receiving supplemental oxygen, remdesivir, systemic steroids and Actemra. On 1/4 developed worsening hypoxia followed by decreased mental status after patient got up without oxygen ultimately requiring intubation   Past Medical History  Morbid obesity, diabetes type 2, hypertension, chronic diastolic heart failure, sickle cell trait, recent thymoma resection  Significant Hospital Events   1/1: Admitted started on supplemental oxygen, remdesivir, and systemic steroids  1/2 received Actemra 1/3: Oxygen requirements improving continuing remdesivir and steroids inflammatory markers starting to improve 1/4: Oxygen requirements increasing, had been down to 6 to 8 L via nasal cannula, this required titration up to 15 L.  She was more anxious.  There was not significant change in her chest x-ray.  The patient became more confused in the early evening hours got out of bed without oxygen after this event more obtunded pulse oximetry 82 to 86% on high flow nasal cannula and nonrebreather she was unresponsive to sternal rub she was intubated at [facility].  Post intubation ABG still hypercarbic, had significant hypoxia 1/5: Aggressive IV diuresis started, proning protocol with heavy sedation initiated, left IJ triple-lumen catheter placed, echo ordered. 1/6: Newly identified LV dysfunction by echocardiogram with EF 45 to 50%.  Still fairly hypoxic requiring high FiO2/PEEP ongoing supportive care.  Changing propofol to Versed given rising triglycerides.  Did become more hypotensive requiring escalation of vasoactive drips, and up titration of PEEP and FiO2 after being  placed back in the supine position  Consults:  Critical care consulted 1/4  Procedures:  Oral endotracheal tube 1/4 Left OIJ CVL 1/5>>> Significant Diagnostic Tests:  Echo 1/6 Left Ventricle: Left ventricular ejection fraction, by visual estimation, is 45 to 50%. The left ventricle has mildly decreased function. The left ventricle is not well visualized. There is moderately increased left ventricular hypertrophy. Left ventricular diastolic parameters are indeterminate. Micro Data:  Coronavirus 2 antigen 1/1: Positive Blood cultures x2 1/1>>> mrsa PCR>>> Antimicrobials:  Vancomycin 1/4 Cefepime 1/4  Interim history/subjective:  Sedated on ventilator,  Objective   Blood pressure 111/64, pulse 88, temperature 98.6 F (37 C), temperature source Axillary, resp. rate (Abnormal) 24, height 5\' 9"  (1.753 m), weight (Abnormal) 178.2 kg, last menstrual period 06/17/2012, SpO2 100 %.    Vent Mode: PRVC FiO2 (%):  [100 %] 100 % Set Rate:  [24 bmp] 24 bmp Vt Set:  [400 mL] 400 mL PEEP:  [14 cmH20] 14 cmH20 Plateau Pressure:  [28 cmH20-29 cmH20] 28 cmH20   Intake/Output Summary (Last 24 hours) at 01/17/2019 0854 Last data filed at 01/17/2019 0830 Gross per 24 hour  Intake 3439.42 ml  Output 6025 ml  Net -2585.58 ml   Filed Weights   01/13/19 0410 01/13/19 0413  Weight: (Abnormal) 178.2 kg (Abnormal) 178.2 kg    Examination: General this is an obese 54 year old black female she is currently heavily sedated on mechanical ventilation HEENT normocephalic atraumatic orally intubated left IJ triple-lumen catheter unremarkable Pulmonary diminished bilaterally equal chest rise FiO2:80 PEEP:14 Plateau pressure:27 Driving pressure: Cardiac regular rate and rhythm without murmur rub or gallop Abdomen soft nontender Extremities are warm and dry with brisk capillary refill Neuro heavily sedated  Resolved Hospital Problem list  Assessment & Plan:   Acute hypoxic and hypercarbic  respiratory failure in the setting of Covid pneumonia and ARDS superimposed on underlying morbid obesity/obesity hypoventilation syndrome/sleep apnea History of diastolic dysfunction Acute systolic heart failure  Medication related hypotension diabetes type 2 with hyperglycemia  Discussion/impression Primary problem is Covid pneumonia with acute lung injury, however she does have underlying known diastolic dysfunction and also most recent echocardiogram demonstrates new mild left ventricular dysfunction so suspect element of pulmonary edema also complicating this picture all superimposed on her underlying obesity and obesity hypoventilation syndrome likely contributing to the hypercarbia component  Portable chest x-ray personally reviewed: Bilateral right greater than left airspace disease this is patchy in nature with volume loss in both bases endotracheal tube and left IJ central venous catheter were in satisfactory position  Remains on high FiO2/PEEP, INO balance within target at -1.1 L  Plan/recommendations Continue low tidal volume ventilation at 6 mL/kg predicted body weight Target PaO2 55 to 65% Continuing proning protocol as long as P/F ratio remains less than 150 in the supine position Day #6 of 10 Decadron Complete 5 days of remdesivir Day #3 of vancomycin and cefepime, cultures have been negative to date, if her MRSA screen is negative we can discontinue vancomycin today, and plan for 5-day course of cefepime Continue IV diuresis as long as BUN and creatinine will allow, she seems to be tolerating this okay Empirically replace potassium with IV diuresis Continue to trend inflammatory markers Continuing PAD protocol with RASS goal -4, will need to change propofol to Versed given rising triglycerides, have allowed for as needed neuromuscular blockade given FiO2/PEEP requirements Continue zinc, vitamin C, and vitamin D A.m. chest x-ray VAP bundle Continue aspirin and  statin Discontinue Coreg Titrate norepinephrine for mean arterial pressure greater than 65 All other therapies as directed by primary service   Best practice:  Diet: Starting tube feeds 1/5 Pain/Anxiety/Delirium protocol (if indicated): 1/4 VAP protocol (if indicated): 1/4 DVT prophylaxis: Low molecular weight heparin GI prophylaxis: H2 blockade Glucose control: Sliding scale insulin Mobility: Bedrest Code Status: Full code Family Communication: Pending per primary service Disposition:  ICU  Critical care time: 35 minutes    Erick Colace ACNP-BC [place] Pager # [phone] OR # [phone] if no answer

## 2019-01-17 NOTE — Progress Notes (Signed)
Pt proned at this time w/o complication after taping ETT.

## 2019-01-17 NOTE — Procedures (Signed)
Arterial Catheter Insertion Procedure Note ERVINA IRIBE [id number] 1965-09-06  Procedure: Insertion of Arterial Catheter  Indications: Frequent blood sampling  Procedure Details Consent: Unable to obtain consent because of emergent medical necessity. Time Out: Verified patient identification, verified procedure, site/side was marked, verified correct patient position, special equipment/implants available, medications/allergies/relevent history reviewed, required imaging and test results available.  Performed  Maximum sterile technique was used including antiseptics, cap, gloves, gown, hand hygiene, mask and sheet. Skin prep: Chlorhexidine; local anesthetic administered 20 gauge catheter was inserted into right radial artery using the Seldinger technique. ULTRASOUND GUIDANCE USED: NO Evaluation Blood flow good; BP tracing good. Complications: No apparent complications   Arterial line placed x1 attempt. Pt tolerated well. RT will continue to monitor. Sharla Kidney 01/17/2019

## 2019-01-18 ENCOUNTER — Inpatient Hospital Stay (HOSPITAL_COMMUNITY): Payer: Medicaid Other

## 2019-01-18 DIAGNOSIS — Z452 Encounter for adjustment and management of vascular access device: Secondary | ICD-10-CM

## 2019-01-18 LAB — CBC WITH DIFFERENTIAL/PLATELET
Abs Immature Granulocytes: 0.12 10*3/uL — ABNORMAL HIGH (ref 0.00–0.07)
Basophils Absolute: 0 10*3/uL (ref 0.0–0.1)
Basophils Relative: 0 %
Eosinophils Absolute: 0 10*3/uL (ref 0.0–0.5)
Eosinophils Relative: 0 %
HCT: 47.1 % — ABNORMAL HIGH (ref 36.0–46.0)
Hemoglobin: 14.8 g/dL (ref 12.0–15.0)
Immature Granulocytes: 1 %
Lymphocytes Relative: 15 %
Lymphs Abs: 2.4 10*3/uL (ref 0.7–4.0)
MCH: 24.5 pg — ABNORMAL LOW (ref 26.0–34.0)
MCHC: 31.4 g/dL (ref 30.0–36.0)
MCV: 77.9 fL — ABNORMAL LOW (ref 80.0–100.0)
Monocytes Absolute: 0.3 10*3/uL (ref 0.1–1.0)
Monocytes Relative: 2 %
Neutro Abs: 12.8 10*3/uL — ABNORMAL HIGH (ref 1.7–7.7)
Neutrophils Relative %: 82 %
Platelets: 581 10*3/uL — ABNORMAL HIGH (ref 150–400)
RBC: 6.05 MIL/uL — ABNORMAL HIGH (ref 3.87–5.11)
RDW: 18.6 % — ABNORMAL HIGH (ref 11.5–15.5)
WBC: 15.7 10*3/uL — ABNORMAL HIGH (ref 4.0–10.5)
nRBC: 0.3 % — ABNORMAL HIGH (ref 0.0–0.2)

## 2019-01-18 LAB — POCT I-STAT 7, (LYTES, BLD GAS, ICA,H+H)
Acid-Base Excess: 5 mmol/L — ABNORMAL HIGH (ref 0.0–2.0)
Acid-Base Excess: 2 mmol/L (ref 0.0–2.0)
Bicarbonate: 31.8 mmol/L — ABNORMAL HIGH (ref 20.0–28.0)
Bicarbonate: 29.3 mmol/L — ABNORMAL HIGH (ref 20.0–28.0)
Calcium, Ion: 1.16 mmol/L (ref 1.15–1.40)
Calcium, Ion: 1.19 mmol/L (ref 1.15–1.40)
HCT: 47 % — ABNORMAL HIGH (ref 36.0–46.0)
HCT: 43 % (ref 36.0–46.0)
Hemoglobin: 16 g/dL — ABNORMAL HIGH (ref 12.0–15.0)
Hemoglobin: 14.6 g/dL (ref 12.0–15.0)
O2 Saturation: 98 %
O2 Saturation: 86 %
Patient temperature: 101.5
Potassium: 4.5 mmol/L (ref 3.5–5.1)
Potassium: 4.3 mmol/L (ref 3.5–5.1)
Sodium: 146 mmol/L — ABNORMAL HIGH (ref 135–145)
Sodium: 145 mmol/L (ref 135–145)
TCO2: 33 mmol/L — ABNORMAL HIGH (ref 22–32)
TCO2: 31 mmol/L (ref 22–32)
pCO2 arterial: 55.5 mmHg — ABNORMAL HIGH (ref 32.0–48.0)
pCO2 arterial: 52.8 mmHg — ABNORMAL HIGH (ref 32.0–48.0)
pH, Arterial: 7.373 (ref 7.350–7.450)
pH, Arterial: 7.352 (ref 7.350–7.450)
pO2, Arterial: 122 mmHg — ABNORMAL HIGH (ref 83.0–108.0)
pO2, Arterial: 55 mmHg — ABNORMAL LOW (ref 83.0–108.0)

## 2019-01-18 LAB — COMPREHENSIVE METABOLIC PANEL
ALT: 53 U/L — ABNORMAL HIGH (ref 0–44)
AST: 70 U/L — ABNORMAL HIGH (ref 15–41)
Albumin: 2.9 g/dL — ABNORMAL LOW (ref 3.5–5.0)
Alkaline Phosphatase: 86 U/L (ref 38–126)
Anion gap: 12 (ref 5–15)
BUN: 38 mg/dL — ABNORMAL HIGH (ref 6–20)
CO2: 29 mmol/L (ref 22–32)
Calcium: 8.7 mg/dL — ABNORMAL LOW (ref 8.9–10.3)
Chloride: 103 mmol/L (ref 98–111)
Creatinine, Ser: 1.03 mg/dL — ABNORMAL HIGH (ref 0.44–1.00)
GFR calc Af Amer: >60 mL/min (ref >60)
GFR calc non Af Amer: >60 mL/min (ref >60)
Glucose, Bld: 256 mg/dL — ABNORMAL HIGH (ref 70–99)
Potassium: 4.4 mmol/L (ref 3.5–5.1)
Sodium: 144 mmol/L (ref 135–145)
Total Bilirubin: 0.7 mg/dL (ref 0.3–1.2)
Total Protein: 6.6 g/dL (ref 6.5–8.1)

## 2019-01-18 LAB — GLUCOSE, CAPILLARY
Glucose-Capillary: 246 mg/dL — ABNORMAL HIGH (ref 70–99)
Glucose-Capillary: 168 mg/dL — ABNORMAL HIGH (ref 70–99)
Glucose-Capillary: 144 mg/dL — ABNORMAL HIGH (ref 70–99)
Glucose-Capillary: 245 mg/dL — ABNORMAL HIGH (ref 70–99)
Glucose-Capillary: 355 mg/dL — ABNORMAL HIGH (ref 70–99)
Glucose-Capillary: 429 mg/dL — ABNORMAL HIGH (ref 70–99)
Glucose-Capillary: 501 mg/dL (ref 70–99)

## 2019-01-18 LAB — TROPONIN I (HIGH SENSITIVITY): Troponin I (High Sensitivity): 45 ng/L — ABNORMAL HIGH (ref <18)

## 2019-01-18 LAB — POCT I-STAT EG7
Acid-Base Excess: 6 mmol/L — ABNORMAL HIGH (ref 0.0–2.0)
Bicarbonate: 33.8 mmol/L — ABNORMAL HIGH (ref 20.0–28.0)
Calcium, Ion: 1.18 mmol/L (ref 1.15–1.40)
HCT: 46 % (ref 36.0–46.0)
Hemoglobin: 15.6 g/dL — ABNORMAL HIGH (ref 12.0–15.0)
O2 Saturation: 62 %
Patient temperature: 101.5
Potassium: 4.2 mmol/L (ref 3.5–5.1)
Sodium: 146 mmol/L — ABNORMAL HIGH (ref 135–145)
TCO2: 36 mmol/L — ABNORMAL HIGH (ref 22–32)
pCO2, Ven: 63.5 mmHg — ABNORMAL HIGH (ref 44.0–60.0)
pH, Ven: 7.342 (ref 7.250–7.430)
pO2, Ven: 39 mmHg (ref 32.0–45.0)

## 2019-01-18 LAB — VANCOMYCIN, TROUGH: Vancomycin Tr: 18 ug/mL (ref 15–20)

## 2019-01-18 LAB — MRSA PCR SCREENING: MRSA by PCR: NEGATIVE

## 2019-01-18 LAB — C-REACTIVE PROTEIN: CRP: 2 mg/dL — ABNORMAL HIGH (ref <1.0)

## 2019-01-18 LAB — VANCOMYCIN, PEAK: Vancomycin Pk: 39 ug/mL (ref 30–40)

## 2019-01-18 LAB — D-DIMER, QUANTITATIVE: D-Dimer, Quant: 7.03 ug/mL-FEU — ABNORMAL HIGH (ref 0.00–0.50)

## 2019-01-18 LAB — TRIGLYCERIDES: Triglycerides: 270 mg/dL — ABNORMAL HIGH (ref <150)

## 2019-01-18 MED ORDER — METOPROLOL TARTRATE 5 MG/5ML IV SOLN
2.5000 mg | INTRAVENOUS | Status: DC | PRN
  Administered 2019-01-18 – 2019-01-28 (×3): 5 mg via INTRAVENOUS
  Filled 2019-01-18 (×3): qty 5

## 2019-01-18 MED ORDER — MIDAZOLAM HCL 2 MG/2ML IJ SOLN
4.0000 mg | Freq: Once | INTRAMUSCULAR | Status: AC
  Administered 2019-01-18: 14:00:00 4 mg via INTRAVENOUS
  Filled 2019-01-18: qty 4

## 2019-01-18 MED ORDER — VANCOMYCIN HCL IN DEXTROSE 1-5 GM/200ML-% IV SOLN
1000.0000 mg | Freq: Two times a day (BID) | INTRAVENOUS | Status: DC
  Administered 2019-01-18 – 2019-01-20 (×5): 1000 mg via INTRAVENOUS
  Filled 2019-01-18 (×8): qty 200

## 2019-01-18 MED ORDER — VASOPRESSIN 20 UNIT/ML IV SOLN
0.0300 [IU]/min | INTRAVENOUS | Status: DC
  Administered 2019-01-18: 0.03 [IU]/min via INTRAVENOUS
  Filled 2019-01-18: qty 2

## 2019-01-18 MED ORDER — SODIUM CHLORIDE 0.9 % IV SOLN
1.0000 g | Freq: Three times a day (TID) | INTRAVENOUS | Status: DC
  Administered 2019-01-18 – 2019-01-21 (×10): 1 g via INTRAVENOUS
  Filled 2019-01-18 (×12): qty 1

## 2019-01-18 MED ORDER — INSULIN REGULAR(HUMAN) IN NACL 100-0.9 UT/100ML-% IV SOLN
INTRAVENOUS | Status: DC
  Administered 2019-01-18: 13 [IU]/h via INTRAVENOUS
  Administered 2019-01-19: 18 [IU]/h via INTRAVENOUS
  Administered 2019-01-19: 30 [IU]/h via INTRAVENOUS
  Administered 2019-01-19: 17 [IU]/h via INTRAVENOUS
  Administered 2019-01-20: 30 [IU]/h via INTRAVENOUS
  Administered 2019-01-20: 22 [IU]/h via INTRAVENOUS
  Administered 2019-01-20: 13 [IU]/h via INTRAVENOUS
  Filled 2019-01-18 (×6): qty 100

## 2019-01-18 MED ORDER — DEXTROSE 50 % IV SOLN: 0.0000 mL | INTRAVENOUS | Status: DC | PRN

## 2019-01-18 MED ORDER — FENTANYL CITRATE (PF) 100 MCG/2ML IJ SOLN
100.0000 ug | INTRAMUSCULAR | Status: AC | PRN
  Administered 2019-01-19 – 2019-01-20 (×3): 100 ug via INTRAVENOUS

## 2019-01-18 MED ORDER — OXYCODONE HCL 5 MG PO TABS
10.0000 mg | ORAL_TABLET | Freq: Four times a day (QID) | ORAL | Status: DC
  Administered 2019-01-18 – 2019-01-25 (×29): 10 mg via ORAL
  Filled 2019-01-18 (×29): qty 2

## 2019-01-18 MED ORDER — CLONAZEPAM 1 MG PO TABS
2.0000 mg | ORAL_TABLET | Freq: Two times a day (BID) | ORAL | Status: DC
  Administered 2019-01-18 – 2019-01-24 (×14): 2 mg via ORAL
  Filled 2019-01-18 (×14): qty 2

## 2019-01-18 MED ORDER — NOREPINEPHRINE 4 MG/250ML-% IV SOLN
INTRAVENOUS | Status: AC
  Administered 2019-01-18: 6 ug/min via INTRAVENOUS
  Filled 2019-01-18: qty 250

## 2019-01-18 MED ORDER — DEXTROSE-NACL 5-0.45 % IV SOLN
INTRAVENOUS | Status: DC
  Filled 2019-01-18: qty 1000

## 2019-01-18 MED ORDER — CIPROFLOXACIN IN D5W 400 MG/200ML IV SOLN
400.0000 mg | Freq: Two times a day (BID) | INTRAVENOUS | Status: DC
  Administered 2019-01-18 – 2019-01-21 (×7): 400 mg via INTRAVENOUS
  Filled 2019-01-18 (×8): qty 200

## 2019-01-18 MED ORDER — HYDROCORTISONE NA SUCCINATE PF 100 MG IJ SOLR
50.0000 mg | Freq: Four times a day (QID) | INTRAMUSCULAR | Status: DC
  Administered 2019-01-18 – 2019-01-20 (×10): 50 mg via INTRAVENOUS
  Filled 2019-01-18 (×10): qty 2

## 2019-01-18 MED ORDER — FENTANYL CITRATE (PF) 100 MCG/2ML IJ SOLN: 50.0000 ug | INTRAMUSCULAR | Status: DC | PRN

## 2019-01-18 MED ORDER — NOREPINEPHRINE 4 MG/250ML-% IV SOLN
0.0000 ug/min | INTRAVENOUS | Status: DC
  Administered 2019-01-18: 2 ug/min via INTRAVENOUS
  Administered 2019-01-18: 8 ug/min via INTRAVENOUS
  Administered 2019-01-19: 06:00:00 12 ug/min via INTRAVENOUS
  Administered 2019-01-19 (×2): 8 ug/min via INTRAVENOUS
  Administered 2019-01-22: 17:00:00 2 ug/min via INTRAVENOUS
  Administered 2019-01-23: 4 ug/min via INTRAVENOUS
  Filled 2019-01-18 (×7): qty 250

## 2019-01-18 MED ORDER — LACTATED RINGERS IV BOLUS
2000.0000 mL | Freq: Once | INTRAVENOUS | Status: AC
  Administered 2019-01-18: 10:00:00 2000 mL via INTRAVENOUS

## 2019-01-18 MED ORDER — SODIUM CHLORIDE 0.9 % IV SOLN: INTRAVENOUS | Status: DC

## 2019-01-18 MED ORDER — STERILE WATER FOR INJECTION IJ SOLN
INTRAMUSCULAR | Status: AC
  Filled 2019-01-18: qty 10

## 2019-01-18 NOTE — Progress Notes (Signed)
Spoke with patient's daughter over the phone. Gave update. Answered all questions. Planned video call for night shift (will make night shift RN aware).

## 2019-01-18 NOTE — Progress Notes (Signed)
[facility] Progress Note Patient Name: Kaitlin [name] DOB: 1965/07/01 MRN: [id number]   Date of Service  01/18/2019  HPI/Events of Note  Hyperglycemia - Blood glucose = 145 --> 355 --> 415.   eICU Interventions  Will order: 1.D/C current insulin orders. 2. Insulin IV infusion per Endo Tool order set.      Intervention Category Major Interventions: Hyperglycemia - active titration of insulin therapy  Lysle Dingwall 01/18/2019, 10:07 PM

## 2019-01-18 NOTE — Progress Notes (Signed)
RT NOTE:  Pt's head turned to the left 

## 2019-01-18 NOTE — Progress Notes (Signed)
VASCULAR LAB    Unable to perform lower extremity venous duplex as patient is proned at 12:00 01/18/19. Will attempt at a later time.    Owain Eckerman, RVT 01/18/2019, 12:24 PM

## 2019-01-18 NOTE — Progress Notes (Signed)
[facility] PROGRESS NOTE    Progress Note  Kaitlin [name]  [id number] DOB: 1965/03/04 DOA: 01/12/2019 PCP: Olga Coaster, [facility]     Brief Narrative:   Kaitlin [name] is an 54 y.o. 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  In the ED she was found to be hypoxic with a chest x-ray with bilateral infiltrates.  Assessment/Plan:   Acute respiratory failure with hypoxia secondarily to Pneumonia due to COVID-19 virus:  Recent Labs  Lab 01/12/19 1838 01/13/19 0823 01/14/19 0147 01/14/19 0500 01/15/19 0135 01/15/19 0343 01/16/19 0545 01/17/19 0500 01/18/19 0120  DDIMER 1.54* 1.29* 1.23*  --  1.60*  --  2.52* 3.66* 7.03*  FERRITIN 473*  --   --   --   --   --   --   --   --   CRP 19.5* 19.3*  --  15.8* 8.2*  --  4.2* 3.8* 2.0*  ALT 37 33 37  --  38  --  33 31 37*  PROCALCITON <0.10 0.10  --   --   --  <0.10 <0.10 <0.10  --    She was placed on remdesivir and steroids.  She also received 1 dose of Actemra.  She was also covered empirically with vancomycin and cefepime due to concern for secondary bacterial infection.    Patient remains intubated and sedated.  Currently in prone position.  Pulmonology is following.  CRP improved to 2.0.  Procalcitonin was less than 0.1.  D-dimer noted to have gone up to 7.03.  Discussed with PCCM.  Will order lower extremity Dopplers.  Will defer CT angiogram to pulmonology.  Patient also on scheduled furosemide.    Noted to be febrile this morning.  Antibiotic coverage has been changed.  Acute on chronic systolic and diastolic congestive heart failure Echocardiogram done yesterday shows EF of 45%. Patient with known diastolic dysfunction.  Continue furosemide.  Ins and outs.   Daily weights.  Septic shock Carvedilol was discontinued.  Patient requiring pressors.  Patient had a significant drop in her blood pressure this morning.  She is now requiring more than 1 pressors.  Critical care medicine is managing.     Insulin-dependent diabetes mellitus type 2: Patient on insulin pump at home.  Continue Lantus and SSI and scheduled short-acting coverage.  HbA1c was 7.1.   HLD (hyperlipidemia): Continue statins.  Morbid obesity Estimated body mass index is 58.02 kg/m as calculated from the following:   Height as of this encounter: '5\' 9"'  (1.753 m).   Weight as of this encounter: 178.2 kg.  Nutrition On tube feedings.   DVT prophylaxis: On Lovenox 85 mg every 12 hours CODE STATUS: Full code Family Communication: Voicemail was left for her daughter yesterday. Disposition Plan: Remain in ICU for now.  Disposition will depend on her clinical course in the hospital.    IV Access:    Peripheral IV   Procedures and diagnostic studies:   Transthoracic echocardiogram 01/16/2019  1. Left ventricular ejection fraction, by visual estimation, is 45 to 50%. The left ventricle has mildly decreased function. There is moderately increased left ventricular hypertrophy.  2. Left ventricular diastolic parameters are indeterminate.  3. Global right ventricle was not well visualized.The right ventricular size is not well visualized. Right vetricular wall thickness  was not assessed.  4. Left atrial size was normal.  5. The mitral valve is normal in structure. Trivial mitral valve regurgitation. No evidence of mitral stenosis.  6. The tricuspid valve is not well visualized.  7. The aortic valve was not well visualized. Aortic valve regurgitation is not visualized. No evidence of aortic valve sclerosis or stenosis.  8. The pulmonic valve was not well visualized. Pulmonic valve regurgitation is trivial.   Radiology Reports DG Chest Port 1 View  Result Date:  01/18/2019 CLINICAL DATA:  Acute respiratory failure EXAM: PORTABLE CHEST 1 VIEW COMPARISON:  Radiograph 01/16/2019, CT 05/15/2018 FINDINGS: *Endotracheal tube terminates in the mid to lower trachea, similar position to comparison though difficult to assess distance from the carina given poor definition of the tracheal borders. *Transesophageal tip terminates below the level of imaging, beyond the GE junction. *Left IJ catheter tip terminates at the right atrium. Diminishing lung volumes with worsening bilateral interstitial and airspace opacities throughout both lungs. More superimposed areas of bandlike opacity likely reflect subsegmental atelectatic change. 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 No acute osseous or soft tissue abnormality. IMPRESSION: 1. Diminishing lung volumes with worsening interstitial and airspace opacities throughout both lungs. 2. Grossly stable appearance of the support devices. Electronically Signed   By: Lovena Le M.D.   On: 01/18/2019 05:35     Medical Consultants:    Pulmonology.  Anti-Infectives:   Anti-infectives (From admission, onward)   Start     Dose/Rate Route Frequency Ordered Stop   01/18/19 1000  meropenem (MERREM) 1 g in sodium chloride 0.9 % 100 mL IVPB     1 g 200 mL/hr over 30 Minutes Intravenous Every 8 hours 01/18/19 0903     01/18/19 1000  ciprofloxacin (CIPRO) IVPB 400 mg     400 mg 200 mL/hr over 60 Minutes Intravenous Every 12 hours 01/18/19 0903     01/16/19 1100  vancomycin (VANCOREADY) IVPB 1500 mg/300 mL     1,500 mg 150 mL/hr over 120 Minutes Intravenous Every 12 hours 01/15/19 1633     01/16/19 0000  vancomycin (VANCOCIN) IVPB 1000 mg/200 mL premix     1,000 mg 200 mL/hr over 60 Minutes Intravenous  Once 01/15/19 2318 01/16/19 1436   01/15/19 1700  ceFEPIme (MAXIPIME) 2 g in sodium chloride 0.9 % 100 mL IVPB   Status:  Discontinued     2 g 200 mL/hr over 30 Minutes Intravenous Every 8 hours 01/15/19 1634 01/18/19 0902   01/15/19 1645  vancomycin (VANCOCIN) 2,500 mg in sodium chloride 0.9 % 500 mL IVPB  Status:  Discontinued     2,500 mg 250 mL/hr over 120 Minutes Intravenous  Once 01/15/19 1633 01/16/19 1435   01/14/19 1000  remdesivir 100 mg in sodium chloride 0.9 % 100 mL IVPB     100 mg 200 mL/hr over 30 Minutes Intravenous Daily 01/12/19 2341 01/17/19 1100   01/13/19 0300  remdesivir 200 mg in sodium chloride 0.9% 250 mL IVPB     200 mg 580 mL/hr over 30 Minutes Intravenous Once 01/12/19 2341 01/13/19 0715       Subjective:    Patient remains intubated and sedated.  Currently in prone position.   Objective:    Vitals:   01/18/19 0900 01/18/19 1000 01/18/19 1100 01/18/19 1200  BP: 98/81 (!) 111/93 114/72 (!) 100/33  Pulse:   (!) 145 (!) 146  Resp: (!) 24 Marland Kitchen)  24 (!) 26 (!) 28  Temp:    (!) 101.2 F (38.4 C)  TempSrc:    Oral  SpO2:    91%  Weight:      Height:       SpO2: 91 % O2 Flow Rate (L/min): 15 L/min FiO2 (%): 50 %   Intake/Output Summary (Last 24 hours) at 01/18/2019 1455 Last data filed at 01/18/2019 1300 Gross per 24 hour  Intake 4473.15 ml  Output 6460 ml  Net -1986.85 ml   Filed Weights   01/13/19 0410 01/13/19 0413  Weight: (!) 178.2 kg (!) 178.2 kg    General appearance: Prone position.  Intubated. Resp: Crackles bilaterally.  No wheezing or rhonchi. Cardio: Unable to examine due to prone position GI: Unable to examine due to prone position Extremities: Some edema noted bilateral lower extremities Neurologic: Sedated.    Data Reviewed:    Labs: Basic Metabolic Panel: Recent Labs  Lab 01/13/19 0823 01/14/19 0147 01/15/19 0135 01/16/19 0545 01/16/19 0718 01/16/19 2040 01/17/19 0500 01/17/19 1430 01/17/19 1733 01/18/19 0120 01/18/19 1131 01/18/19 1137  NA 141 140 141 138  --   --  143 143  --  144 146* 146*  K 4.1 4.5 4.6 4.5  --    --  3.8 3.8  --  4.4 4.5 4.2  CL 104 103 103 97*  --   --  100  --   --  103  --   --   CO2 '26 27 29 29  ' --   --  32  --   --  29  --   --   GLUCOSE 149* 305* 235* 256*  --   --  120*  --   --  256*  --   --   BUN 17 23* 21* 23*  --   --  27*  --   --  38*  --   --   CREATININE 0.76 0.89 0.86 1.05*  --   --  0.89  --   --  1.03*  --   --   CALCIUM 8.4* 8.8* 8.9 8.8*  --   --  8.5*  --   --  8.7*  --   --   MG 2.2  --   --   --  2.1 2.2 2.2  --  2.1  --   --   --   PHOS  --   --   --   --  3.5 3.9 4.1  --  4.4  --   --   --    GFR Estimated Creatinine Clearance: 113.2 mL/min (A) (by C-G formula based on SCr of 1.03 mg/dL (H)).  Liver Function Tests: Recent Labs  Lab 01/14/19 0147 01/15/19 0135 01/16/19 0545 01/17/19 0500 01/18/19 0120  AST 47* 43* 45* 42* 70*  ALT 37 38 33 31 53*  ALKPHOS 61 64 72 81 86  BILITOT 0.7 0.4 0.4 0.6 0.7  PROT 6.6 6.7 6.3* 6.2* 6.6  ALBUMIN 2.9* 2.7* 2.8* 2.8* 2.9*   COVID-19 Labs  Recent Labs    01/16/19 0545 01/17/19 0500 01/18/19 0120  DDIMER 2.52* 3.66* 7.03*  CRP 4.2* 3.8* 2.0*    Lab Results  Component Value Date   SARSCOV2NAA NEGATIVE 11/19/2018    CBC: Recent Labs  Lab 01/14/19 0147 01/15/19 0135 01/16/19 0545 01/17/19 0500 01/17/19 1430 01/18/19 0120 01/18/19 1131 01/18/19 1137  WBC 7.5 7.5 7.5 10.8*  --  15.7*  --   --  NEUTROABS 5.6 4.8 5.1 7.2  --  12.8*  --   --   HGB 12.7 13.6 13.8 13.5 15.0 14.8 16.0* 15.6*  HCT 38.9 41.6 41.8 41.6 44.0 47.1* 47.0* 46.0  MCV 76.6* 77.0* 76.0* 76.6*  --  77.9*  --   --   PLT 254 305 363 472*  --  581*  --   --    CBG: Recent Labs  Lab 01/17/19 2032 01/18/19 0113 01/18/19 0502 01/18/19 0759 01/18/19 1224  GLUCAP 314* 246* 168* 144* 245*   D-Dimer: Recent Labs    01/17/19 0500 01/18/19 0120  DDIMER 3.66* 7.03*   Lipid Profile: Recent Labs    01/17/19 0500 01/18/19 0120  TRIG 304* 270*   Sepsis Labs: Recent Labs  Lab 01/12/19 1838 01/13/19 0823  01/15/19 0135 01/15/19 0343 01/16/19 0545 01/17/19 0500 01/18/19 0120  PROCALCITON <0.10 0.10  --  <0.10 <0.10 <0.10  --   WBC 8.7 6.1 7.5  --  7.5 10.8* 15.7*  LATICACIDVEN 1.1  --   --   --   --   --   --    Microbiology Recent Results (from the past 240 hour(s))  Blood Culture (routine x 2)     Status: None   Collection Time: 01/12/19  6:38 PM   Specimen: BLOOD  Result Value Ref Range Status   Specimen Description   Final    BLOOD RIGHT ANTECUBITAL Performed at [facility], [address] [place], [place] [address]    Special Requests   Final    BOTTLES DRAWN AEROBIC AND ANAEROBIC Blood Culture adequate volume Performed at [facility], [address] [place], [place] [address]    Culture   Final    NO GROWTH 5 DAYS Performed at [facility], [place] [address], [place], [place] [address]    Report Status 01/17/2019 FINAL  Final  SARS Coronavirus 2 Ag (30 min TAT) - Nasal Swab (BD Veritor Kit)     Status: Abnormal   Collection Time: 01/12/19  6:38 PM   Specimen: Nasal Swab (BD Veritor Kit)  Result Value Ref Range Status   SARS Coronavirus 2 Ag POSITIVE (A) NEGATIVE Final    Comment: RESULT CALLED TO, READ BACK BY AND VERIFIED WITH: Marsa Aris RN AT 1942 ON 01/12/19 BY [email or web address] (NOTE) SARS-CoV-2 antigen PRESENT. Positive results indicate the presence of viral antigens, but clinical correlation with patient history and other diagnostic information is necessary to determine patient infection status.  Positive results do not rule out bacterial infection or co-infection  with other viruses. False positive results are rare but can occur, and confirmatory RT-PCR testing may be appropriate in some circumstances. The expected result is Negative. Fact Sheet for Patients: PodPark.tn Fact Sheet for Providers: GiftContent.is  This test is not yet approved or cleared by the [place] FDA and   has been authorized for detection and/or diagnosis of SARS-CoV-2 by FDA under an Emergency Use Authorization (EUA).  This EUA will remain in effect (meaning this test can be used) for the duration of  the COVID-19  declaration under Section 564(b)(1) of the Act, 21 U.S.C. section 360bbb-3(b)(1), unless the authorization is terminated or revoked sooner. Performed at [facility], [address] [place], [place] [address]   Blood Culture (routine x 2)     Status: None   Collection Time: 01/12/19  7:36 PM   Specimen: BLOOD  Result Value Ref Range Status  Specimen Description   Final    BLOOD LEFT ANTECUBITAL Performed at [facility], [address] [place], [place] [address]    Special Requests   Final    BOTTLES DRAWN AEROBIC AND ANAEROBIC Blood Culture adequate volume Performed at [facility], [address] [place], [place] [address]    Culture   Final    NO GROWTH 5 DAYS Performed at [facility], [place] [address], [place], [place] [address]    Report Status 01/17/2019 FINAL  Final  MRSA PCR Screening     Status: None   Collection Time: 01/18/19 10:00 AM   Specimen: Nasopharyngeal  Result Value Ref Range Status   MRSA by PCR NEGATIVE NEGATIVE Final    Comment:        The GeneXpert MRSA Assay (FDA approved for NASAL specimens only), is one component of a comprehensive MRSA colonization surveillance program. It is not intended to diagnose MRSA infection nor to guide or monitor treatment for MRSA infections. Performed at [facility], [place] [address] [place], [place] [address]      Medications:   . artificial tears  1 application Both Eyes S9Q  . vitamin C  500 mg Per Tube Daily  . aspirin  81 mg Per Tube Daily  . chlorhexidine  15 mL Mouth/Throat BID  . Chlorhexidine Gluconate Cloth  6 each Topical Daily  . clonazePAM  2 mg Oral BID  . enoxaparin (LOVENOX) injection  85 mg Subcutaneous Q12H  .  famotidine  20 mg Per Tube BID  . feeding supplement (PRO-STAT SUGAR FREE 64)  60 mL Per Tube TID  . feeding supplement (VITAL 1.5 CAL)  1,000 mL Per Tube Q24H  . furosemide  80 mg Intravenous Q12H  . hydrocortisone sod succinate (SOLU-CORTEF) inj  50 mg Intravenous Q6H  . insulin aspart  0-15 Units Subcutaneous Q4H  . insulin aspart  10 Units Subcutaneous Q4H  . ipratropium-albuterol  3 mL Nebulization Q6H  . mouth rinse  15 mL Mouth Rinse 10 times per day  . oxyCODONE  10 mg Oral Q6H  . rosuvastatin  20 mg Per Tube QPM  . cholecalciferol  1,000 Units Per Tube Daily  . zinc sulfate  220 mg Per Tube Daily   Continuous Infusions: . ciprofloxacin Stopped (01/18/19 1206)  . fentaNYL infusion INTRAVENOUS 300 mcg/hr (01/18/19 1352)  . meropenem (MERREM) IV Stopped (01/18/19 1048)  . midazolam 8 mg/hr (01/18/19 1300)  . norepinephrine (LEVOPHED) Adult infusion Stopped (01/18/19 1100)  . phenylephrine (NEO-SYNEPHRINE) Adult infusion Stopped (01/18/19 1149)  . vancomycin 150 mL/hr at 01/18/19 1300  . vasopressin (PITRESSIN) infusion - *FOR SHOCK* Stopped (01/18/19 1243)     LOS: 6 days   [employer]  [facility]  01/18/2019, 2:55 PM

## 2019-01-18 NOTE — Plan of Care (Signed)

## 2019-01-18 NOTE — Progress Notes (Signed)
RT NOTE:  Pt's head turned to the right.

## 2019-01-18 NOTE — Progress Notes (Signed)
NAME:  Kaitlin [name], MRN:  [id number], DOB:  August 06, 1965, LOS: 6 ADMISSION DATE:  01/12/2019, CONSULTATION DATE:  1/4 REFERRING MD:  Billey Chang, CHIEF COMPLAINT:  Dyspnea   Brief History   80 female admitted 1/1 with COVID pneumonia, worsening hypoxemia and intubated on 1/4 requiring intubation.   Past Medical History  Obesity DM2 HTN Chronic diastolic heart failrue Sickle trait Recent thymoma  Significant Hospital Events   1/1: Admitted started on supplemental oxygen, remdesivir, and systemic steroids  1/2 received Actemra 1/3: Oxygen requirements improving continuing remdesivir and steroids inflammatory markers starting to improve 1/4: Oxygen requirements increasing, had been down to 6 to 8 L via nasal cannula, this required titration up to 15 L.  She was more anxious.  There was not significant change in her chest x-ray.  The patient became more confused in the early evening hours got out of bed without oxygen after this event more obtunded pulse oximetry 82 to 86% on high flow nasal cannula and nonrebreather she was unresponsive to sternal rub she was intubated at [place].  Post intubation ABG still hypercarbic, had significant hypoxia 1/5: Aggressive IV diuresis started, proning protocol with heavy sedation initiated, left IJ triple-lumen catheter placed, echo ordered. 1/6: Newly identified LV dysfunction by echocardiogram with EF 45 to 50%.  Still fairly hypoxic requiring high FiO2/PEEP ongoing supportive care.  Changing propofol to Versed given rising triglycerides.  Did become more hypotensive requiring escalation of vasoactive drips, and up titration of PEEP and FiO2 after being placed back in the supine position  Consults:  PCCM  Procedures:  ETT 1/4 >  L IJ CVL 1/5 >  Arterial R radial 1/6 >   Significant Diagnostic Tests:  Echo 1/6 Left Ventricle: Left ventricular ejection fraction, by visual estimation, is 45 to 50%. The left ventricle has mildly decreased function. The left  ventricle is not well visualized. There is moderately increased left ventricular hypertrophy. Left ventricular diastolic parameters are indeterminate  Micro Data:  Coronavirus 2 antigen 1/1: Positive Blood cultures x2 1/1>>>  1/7 resp culture >  1/7 blood >   Antimicrobials:  1/1 remdesivir >  1/2 toci >  1/1 decadron >  1/4 vanc >  1/4 cefepime > 1/7 1/7 mero >  1/7 cipro >    Interim history/subjective:  Worsening shock WBC, fever overnight  Objective   Blood pressure (!) 80/50, pulse (!) 111, temperature (!) 102 F (38.9 C), temperature source Axillary, resp. rate (!) 24, height 5\' 9"  (1.753 m), weight (!) 178.2 kg, last menstrual period 06/17/2012, SpO2 96 %.    Vent Mode: PRVC FiO2 (%):  [60 %-80 %] 60 % Set Rate:  [24 bmp] 24 bmp Vt Set:  [400 mL] 400 mL PEEP:  [14 cmH20] 14 cmH20 Plateau Pressure:  [26 cmH20-27 cmH20] 27 cmH20   Intake/Output Summary (Last 24 hours) at 01/18/2019 0852 Last data filed at 01/18/2019 0700 Gross per 24 hour  Intake 2564.79 ml  Output 4860 ml  Net -2295.21 ml   Filed Weights   01/13/19 0410 01/13/19 0413  Weight: (!) 178.2 kg (!) 178.2 kg    Examination: General:  In bed on vent, prone HENT: NCAT ETT in place PULM: CTA B, vent supported breathing CV: prone GI: prone MSK: normal bulk and tone Neuro: sedated on vent    Resolved Hospital Problem list     Assessment & Plan:  Septic shock 1/7 30cc/kg LR bolus now Check CVP Check venous gas Start vasopressin Wean off neo Start  stress dose steroids Culture resp, blood Check u/a Change cefepime to mero/cipro Cont vanc  ARDS due to [place], driving OK Hasn't had an ABG Supine ABG today Continue mechanical ventilation per ARDS protocol Target TVol 6-8cc/kgIBW Target Plateau Pressure < 30cm H20 Target driving pressure less than 15 cm of water Target PaO2 55-65: titrate PEEP/FiO2 per protocol As long as PaO2 to FiO2 ratio is less than 1:150 position in prone  position for 16 hours a day Check CVP daily if CVL in place Target CVP less than 4, diurese as necessary Ventilator associated pneumonia prevention protocol  DM2 Adjust long acting and SSI  Tube feeding: If can't get a pump today start bolus feeds in OG  Need for sedation for mechanical vent Intermittent paralytic protocol RASS -4 Fentanyl/versed infusions Add orals: clonazepam and oxycodone   Best practice:  Diet: bolue feeding Pain/Anxiety/Delirium protocol (if indicated): as above VAP protocol (if indicated): yes DVT prophylaxi: lovenox 0.5mg /kg per protocol GI prophylaxis: pepcid for stress ulcer prophylaxis Glucose control: SSI Mobility: bed rest Code Status: full Family Communication: per [facility] Disposition: remain in ICU  Labs   CBC: Recent Labs  Lab 01/14/19 0147 01/15/19 0135 01/16/19 0545 01/16/19 1604 01/16/19 2320 01/17/19 0500 01/17/19 1430 01/18/19 0120  WBC 7.5 7.5 7.5  --   --  10.8*  --  15.7*  NEUTROABS 5.6 4.8 5.1  --   --  7.2  --  12.8*  HGB 12.7 13.6 13.8 13.3 14.6 13.5 15.0 14.8  HCT 38.9 41.6 41.8 39.0 43.0 41.6 44.0 47.1*  MCV 76.6* 77.0* 76.0*  --   --  76.6*  --  77.9*  PLT 254 305 363  --   --  472*  --  581*    Basic Metabolic Panel: Recent Labs  Lab 01/13/19 0823 01/14/19 0147 01/15/19 0135 01/16/19 0545 01/16/19 0718 01/16/19 1604 01/16/19 2040 01/16/19 2320 01/17/19 0500 01/17/19 1430 01/17/19 1733 01/18/19 0120  NA 141 140 141 138  --  138  --  141 143 143  --  144  K 4.1 4.5 4.6 4.5  --  4.3  --  4.0 3.8 3.8  --  4.4  CL 104 103 103 97*  --   --   --   --  100  --   --  103  CO2 26 27 29 29   --   --   --   --  32  --   --  29  GLUCOSE 149* 305* 235* 256*  --   --   --   --  120*  --   --  256*  BUN 17 23* 21* 23*  --   --   --   --  27*  --   --  38*  CREATININE 0.76 0.89 0.86 1.05*  --   --   --   --  0.89  --   --  1.03*  CALCIUM 8.4* 8.8* 8.9 8.8*  --   --   --   --  8.5*  --   --  8.7*  MG 2.2  --   --   --   2.1  --  2.2  --  2.2  --  2.1  --   PHOS  --   --   --   --  3.5  --  3.9  --  4.1  --  4.4  --    GFR: Estimated Creatinine Clearance: 113.2 mL/min (A) (by C-G  formula based on SCr of 1.03 mg/dL (H)). Recent Labs  Lab 01/12/19 1838 01/13/19 0823 01/15/19 0135 01/15/19 0343 01/16/19 0545 01/17/19 0500 01/18/19 0120  PROCALCITON <0.10 0.10  --  <0.10 <0.10 <0.10  --   WBC 8.7 6.1 7.5  --  7.5 10.8* 15.7*  LATICACIDVEN 1.1  --   --   --   --   --   --     Liver Function Tests: Recent Labs  Lab 01/14/19 0147 01/15/19 0135 01/16/19 0545 01/17/19 0500 01/18/19 0120  AST 47* 43* 45* 42* 70*  ALT 37 38 33 31 53*  ALKPHOS 61 64 72 81 86  BILITOT 0.7 0.4 0.4 0.6 0.7  PROT 6.6 6.7 6.3* 6.2* 6.6  ALBUMIN 2.9* 2.7* 2.8* 2.8* 2.9*   No results for input(s): LIPASE, AMYLASE in the last 168 hours. No results for input(s): AMMONIA in the last 168 hours.  ABG    Component Value Date/Time   PHART 7.439 01/17/2019 1430   PCO2ART 49.3 (H) 01/17/2019 1430   PO2ART 101.0 01/17/2019 1430   HCO3 33.4 (H) 01/17/2019 1430   TCO2 35 (H) 01/17/2019 1430   ACIDBASEDEF 3.0 (H) 01/12/2013 1950   O2SAT 98.0 01/17/2019 1430     Coagulation Profile: No results for input(s): INR, PROTIME in the last 168 hours.  Cardiac Enzymes: No results for input(s): CKTOTAL, CKMB, CKMBINDEX, TROPONINI in the last 168 hours.  HbA1C: Hgb A1c MFr Bld  Date/Time Value Ref Range Status  01/13/2019 08:23 AM 7.1 (H) 4.8 - 5.6 % Final    Comment:    (NOTE) Pre diabetes:          5.7%-6.4% Diabetes:              >6.4% Glycemic control for   <7.0% adults with diabetes     CBG: Recent Labs  Lab 01/17/19 1821 01/17/19 2032 01/18/19 0113 01/18/19 0502 01/18/19 0759  GLUCAP 234* 314* 246* 168* 144*     Critical care time: 35 minutes     Roselie Awkward, MD [place] [facility] Pager: [phone] Cell: [phone] If no response, call [phone]

## 2019-01-18 NOTE — Progress Notes (Signed)
Patient placed in supine position.  ETT secured.  No breakdown noted.  Patient tolerated well with no complications. 

## 2019-01-18 NOTE — Progress Notes (Signed)
Pharmacy Antibiotic Note  Kaitlin [name] is a 54 y.o. female admitted on 01/12/2019 with sepsis.  Pharmacy has been consulted to broaden antibiotics to Meropenem and Ciprofloxacin in addition to vancomycin.   Of note, a vancomycin peak and trough obtained today were 39 and 18 respectively resulting in supratherapeutic AUC of 779  Plan: -Start Meropenem 1 gm IV Q 8 hours -Ciprofloxacin 400 mg IV Q12 hours -Decrease Vancomycin to 1000 mg IV Q 12 hrs. Goal AUC 400-550. Expected AUC: 519 -Monitor CBC, renal fx, cultures and clinical progress    Height: 5\' 9"  (175.3 cm) Weight: (!) 392 lb 13.8 oz (178.2 kg) IBW/kg (Calculated) : 66.2  Temp (24hrs), Avg:98.9 F (37.2 C), Min:97.6 F (36.4 C), Max:102 F (38.9 C)  Recent Labs  Lab 01/12/19 1838 01/14/19 0147 01/15/19 0135 01/16/19 0545 01/17/19 0500 01/18/19 0120  WBC 8.7 7.5 7.5 7.5 10.8* 15.7*  CREATININE 1.17* 0.89 0.86 1.05* 0.89 1.03*  LATICACIDVEN 1.1  --   --   --   --   --   VANCOPEAK  --   --   --   --   --  39    Estimated Creatinine Clearance: 113.2 mL/min (A) (by C-G formula based on SCr of 1.03 mg/dL (H)).    Allergies  Allergen Reactions  . Ibuprofen Other (See Comments)    Per MD she should no longer take, unsure why  . Amoxicillin-Pot Clavulanate Diarrhea    Did it involve swelling of the face/tongue/throat, SOB, or low BP? No Did it involve sudden or severe rash/hives, skin peeling, or any reaction on the inside of your mouth or nose? No Did you need to seek medical attention at a hospital or doctor's office? No When did it last happen?Unknown If all above answers are "NO", may proceed with cephalosporin use.    Actremra 1/2 Remdesivir 1/2>>1/6  Vancomycin 1/4> Cefepime 1/4>1/7 Merrem 1/7>> Cipro 1/7>>   1/2 BC x2: ngtd 1/2 SARS Co-V-2: positive  Thank you for allowing pharmacy to be a part of this patient's care.   Albertina Parr, PharmD., BCPS Clinical Pharmacist Clinical phone  for 01/17/18 until 5pm: [phone]

## 2019-01-19 ENCOUNTER — Encounter (HOSPITAL_COMMUNITY): Payer: Medicaid Other

## 2019-01-19 ENCOUNTER — Inpatient Hospital Stay (HOSPITAL_COMMUNITY): Payer: Medicaid Other

## 2019-01-19 DIAGNOSIS — R791 Abnormal coagulation profile: Secondary | ICD-10-CM

## 2019-01-19 DIAGNOSIS — R579 Shock, unspecified: Secondary | ICD-10-CM

## 2019-01-19 DIAGNOSIS — A419 Sepsis, unspecified organism: Secondary | ICD-10-CM

## 2019-01-19 DIAGNOSIS — R609 Edema, unspecified: Secondary | ICD-10-CM

## 2019-01-19 DIAGNOSIS — U071 COVID-19: Secondary | ICD-10-CM

## 2019-01-19 LAB — POCT I-STAT EG7
Acid-Base Excess: 5 mmol/L — ABNORMAL HIGH (ref 0.0–2.0)
Bicarbonate: 34 mmol/L — ABNORMAL HIGH (ref 20.0–28.0)
Calcium, Ion: 1.24 mmol/L (ref 1.15–1.40)
HCT: 40 % (ref 36.0–46.0)
Hemoglobin: 13.6 g/dL (ref 12.0–15.0)
O2 Saturation: 77 %
Potassium: 4.1 mmol/L (ref 3.5–5.1)
Sodium: 148 mmol/L — ABNORMAL HIGH (ref 135–145)
TCO2: 36 mmol/L — ABNORMAL HIGH (ref 22–32)
pCO2, Ven: 69.5 mmHg — ABNORMAL HIGH (ref 44.0–60.0)
pH, Ven: 7.297 (ref 7.250–7.430)
pO2, Ven: 48 mmHg — ABNORMAL HIGH (ref 32.0–45.0)

## 2019-01-19 LAB — POCT I-STAT 7, (LYTES, BLD GAS, ICA,H+H)
Acid-Base Excess: 5 mmol/L — ABNORMAL HIGH (ref 0.0–2.0)
Bicarbonate: 33.1 mmol/L — ABNORMAL HIGH (ref 20.0–28.0)
Calcium, Ion: 1.3 mmol/L (ref 1.15–1.40)
HCT: 39 % (ref 36.0–46.0)
Hemoglobin: 13.3 g/dL (ref 12.0–15.0)
O2 Saturation: 87 %
Patient temperature: 37.5
Potassium: 3.9 mmol/L (ref 3.5–5.1)
Sodium: 148 mmol/L — ABNORMAL HIGH (ref 135–145)
TCO2: 35 mmol/L — ABNORMAL HIGH (ref 22–32)
pCO2 arterial: 65 mmHg — ABNORMAL HIGH (ref 32.0–48.0)
pH, Arterial: 7.317 — ABNORMAL LOW (ref 7.350–7.450)
pO2, Arterial: 61 mmHg — ABNORMAL LOW (ref 83.0–108.0)

## 2019-01-19 LAB — GLUCOSE, CAPILLARY
Glucose-Capillary: 134 mg/dL — ABNORMAL HIGH (ref 70–99)
Glucose-Capillary: 141 mg/dL — ABNORMAL HIGH (ref 70–99)
Glucose-Capillary: 147 mg/dL — ABNORMAL HIGH (ref 70–99)
Glucose-Capillary: 169 mg/dL — ABNORMAL HIGH (ref 70–99)
Glucose-Capillary: 181 mg/dL — ABNORMAL HIGH (ref 70–99)
Glucose-Capillary: 191 mg/dL — ABNORMAL HIGH (ref 70–99)
Glucose-Capillary: 202 mg/dL — ABNORMAL HIGH (ref 70–99)
Glucose-Capillary: 211 mg/dL — ABNORMAL HIGH (ref 70–99)
Glucose-Capillary: 218 mg/dL — ABNORMAL HIGH (ref 70–99)
Glucose-Capillary: 219 mg/dL — ABNORMAL HIGH (ref 70–99)
Glucose-Capillary: 232 mg/dL — ABNORMAL HIGH (ref 70–99)
Glucose-Capillary: 252 mg/dL — ABNORMAL HIGH (ref 70–99)
Glucose-Capillary: 256 mg/dL — ABNORMAL HIGH (ref 70–99)
Glucose-Capillary: 271 mg/dL — ABNORMAL HIGH (ref 70–99)
Glucose-Capillary: 273 mg/dL — ABNORMAL HIGH (ref 70–99)
Glucose-Capillary: 313 mg/dL — ABNORMAL HIGH (ref 70–99)
Glucose-Capillary: 328 mg/dL — ABNORMAL HIGH (ref 70–99)
Glucose-Capillary: 343 mg/dL — ABNORMAL HIGH (ref 70–99)
Glucose-Capillary: 400 mg/dL — ABNORMAL HIGH (ref 70–99)
Glucose-Capillary: 415 mg/dL — ABNORMAL HIGH (ref 70–99)

## 2019-01-19 LAB — COMPREHENSIVE METABOLIC PANEL
ALT: 90 U/L — ABNORMAL HIGH (ref 0–44)
AST: 113 U/L — ABNORMAL HIGH (ref 15–41)
Albumin: 2.6 g/dL — ABNORMAL LOW (ref 3.5–5.0)
Alkaline Phosphatase: 79 U/L (ref 38–126)
Anion gap: 10 (ref 5–15)
BUN: 45 mg/dL — ABNORMAL HIGH (ref 6–20)
CO2: 29 mmol/L (ref 22–32)
Calcium: 8.6 mg/dL — ABNORMAL LOW (ref 8.9–10.3)
Chloride: 108 mmol/L (ref 98–111)
Creatinine, Ser: 1.1 mg/dL — ABNORMAL HIGH (ref 0.44–1.00)
GFR calc Af Amer: >60 mL/min (ref >60)
GFR calc non Af Amer: 58 mL/min — ABNORMAL LOW (ref >60)
Glucose, Bld: 267 mg/dL — ABNORMAL HIGH (ref 70–99)
Potassium: 3.8 mmol/L (ref 3.5–5.1)
Sodium: 147 mmol/L — ABNORMAL HIGH (ref 135–145)
Total Bilirubin: 0.5 mg/dL (ref 0.3–1.2)
Total Protein: 5.8 g/dL — ABNORMAL LOW (ref 6.5–8.1)

## 2019-01-19 LAB — CBC WITH DIFFERENTIAL/PLATELET
Abs Immature Granulocytes: 0.13 10*3/uL — ABNORMAL HIGH (ref 0.00–0.07)
Basophils Absolute: 0 10*3/uL (ref 0.0–0.1)
Basophils Relative: 0 %
Eosinophils Absolute: 0 10*3/uL (ref 0.0–0.5)
Eosinophils Relative: 0 %
HCT: 43.9 % (ref 36.0–46.0)
Hemoglobin: 14.1 g/dL (ref 12.0–15.0)
Immature Granulocytes: 1 %
Lymphocytes Relative: 19 %
Lymphs Abs: 2.7 10*3/uL (ref 0.7–4.0)
MCH: 25.5 pg — ABNORMAL LOW (ref 26.0–34.0)
MCHC: 32.1 g/dL (ref 30.0–36.0)
MCV: 79.4 fL — ABNORMAL LOW (ref 80.0–100.0)
Monocytes Absolute: 0.6 10*3/uL (ref 0.1–1.0)
Monocytes Relative: 4 %
Neutro Abs: 10.7 10*3/uL — ABNORMAL HIGH (ref 1.7–7.7)
Neutrophils Relative %: 76 %
Platelets: 444 10*3/uL — ABNORMAL HIGH (ref 150–400)
RBC: 5.53 MIL/uL — ABNORMAL HIGH (ref 3.87–5.11)
RDW: 18.3 % — ABNORMAL HIGH (ref 11.5–15.5)
WBC: 14.1 10*3/uL — ABNORMAL HIGH (ref 4.0–10.5)
nRBC: 0.3 % — ABNORMAL HIGH (ref 0.0–0.2)

## 2019-01-19 LAB — BRAIN NATRIURETIC PEPTIDE: B Natriuretic Peptide: 74.9 pg/mL (ref 0.0–100.0)

## 2019-01-19 LAB — TRIGLYCERIDES: Triglycerides: 257 mg/dL — ABNORMAL HIGH (ref <150)

## 2019-01-19 MED ORDER — INSULIN DETEMIR 100 UNIT/ML ~~LOC~~ SOLN
0.3000 [IU]/kg | SUBCUTANEOUS | Status: DC
  Administered 2019-01-19: 53 [IU] via SUBCUTANEOUS
  Filled 2019-01-19 (×2): qty 0.53

## 2019-01-19 MED ORDER — DEXTROSE 10 % IV SOLN: INTRAVENOUS | Status: DC

## 2019-01-19 MED ORDER — INSULIN ASPART 100 UNIT/ML ~~LOC~~ SOLN
10.0000 [IU] | SUBCUTANEOUS | Status: DC
  Administered 2019-01-19 – 2019-01-20 (×3): 10 [IU] via SUBCUTANEOUS

## 2019-01-19 MED ORDER — FENTANYL CITRATE (PF) 2500 MCG/50ML IJ SOLN
50.0000 ug/h | INTRAMUSCULAR | Status: DC
  Administered 2019-01-19 – 2019-01-22 (×11): 300 ug/h via INTRAVENOUS
  Administered 2019-01-23: 200 ug/h via INTRAVENOUS
  Administered 2019-01-23: 300 ug/h via INTRAVENOUS
  Administered 2019-01-24: 22:00:00 150 ug/h via INTRAVENOUS
  Administered 2019-01-24: 04:00:00 200 ug/h via INTRAVENOUS
  Administered 2019-01-25: 100 ug/h via INTRAVENOUS
  Administered 2019-01-26: 150 ug/h via INTRAVENOUS
  Filled 2019-01-19 (×18): qty 50

## 2019-01-19 MED ORDER — INSULIN ASPART 100 UNIT/ML ~~LOC~~ SOLN
0.0000 [IU] | SUBCUTANEOUS | Status: DC
  Administered 2019-01-19: 11 [IU] via SUBCUTANEOUS
  Administered 2019-01-19: 4 [IU] via SUBCUTANEOUS
  Administered 2019-01-20: 20 [IU] via SUBCUTANEOUS

## 2019-01-19 NOTE — Progress Notes (Signed)
NAME:  Kaitlin [name], MRN:  [id number], DOB:  1965/08/16, LOS: 7 ADMISSION DATE:  01/12/2019, CONSULTATION DATE:  1/4 REFERRING MD:  Billey Chang, CHIEF COMPLAINT:  Dyspnea   Brief History   54 female admitted 1/1 with COVID pneumonia, worsening hypoxemia and intubated on 1/4 requiring intubation.   Past Medical History  Obesity DM2 HTN Chronic diastolic heart failrue Sickle trait Recent thymoma  Significant Hospital Events   1/1: Admitted started on supplemental oxygen, remdesivir, and systemic steroids  1/2 received Actemra 1/3: Oxygen requirements improving continuing remdesivir and steroids inflammatory markers starting to improve 1/4: Oxygen requirements increasing, had been down to 6 to 8 L via nasal cannula, this required titration up to 15 L.  She was more anxious.  There was not significant change in her chest x-ray.  The patient became more confused in the early evening hours got out of bed without oxygen after this event more obtunded pulse oximetry 82 to 86% on high flow nasal cannula and nonrebreather she was unresponsive to sternal rub she was intubated at [place].  Post intubation ABG still hypercarbic, had significant hypoxia 1/5: Aggressive IV diuresis started, proning protocol with heavy sedation initiated, left IJ triple-lumen catheter placed, echo ordered. 1/6: Newly identified LV dysfunction by echocardiogram with EF 45 to 50%.  Still fairly hypoxic requiring high FiO2/PEEP ongoing supportive care.  Changing propofol to Versed given rising triglycerides.  Did become more hypotensive requiring escalation of vasoactive drips, and up titration of PEEP and FiO2 after.  being placed back in the supine position.  Remdesivir completed  1/7: Worsening shock requiring further titration of vasoactive drips, change from phenylephrine to norepinephrine and vasopressin, stress dose steroids started raising concern for possible sepsis, diuresis discontinued, antibiotics widened 1/8: Fever  curve improved, cultures all negative thus far, pressor requirements improved excision requirements improved Consults:  PCCM  Procedures:  ETT 1/4 >  L IJ CVL 1/5 >  Arterial R radial 1/6 >   Significant Diagnostic Tests:  Echo 1/6 Left Ventricle: Left ventricular ejection fraction, by visual estimation, is 45 to 50%. The left ventricle has mildly decreased function. The left ventricle is not well visualized. There is moderately increased left ventricular hypertrophy. Left ventricular diastolic parameters are indeterminate  Micro Data:  Coronavirus 2 antigen 1/1: Positive Blood cultures x2 1/1>>> 1/7 resp culture >  1/7 blood >   Antimicrobials:  1/1 remdesivir >  1/2 toci >  1/1 decadron > changed to stress dose steroids 1/7 1/4 vanc >  1/4 cefepime > 1/7 1/7 mero >  1/7 cipro >    Interim history/subjective:   No issues overnight Objective   Blood pressure 124/83, pulse (Abnormal) 108, temperature 100.3 F (37.9 C), temperature source Oral, resp. rate (Abnormal) 24, height 5\' 9"  (1.753 m), weight (Abnormal) 178.2 kg, last menstrual period 06/17/2012, SpO2 97 %. CVP:  [10 mmHg-11 mmHg] 10 mmHg  Vent Mode: PRVC FiO2 (%):  [50 %-70 %] 70 % Set Rate:  [24 bmp] 24 bmp Vt Set:  [400 mL] 400 mL PEEP:  [12 cmH20] 12 cmH20 Plateau Pressure:  [22 cmH20-25 cmH20] 25 cmH20   Intake/Output Summary (Last 24 hours) at 01/19/2019 0828 Last data filed at 01/19/2019 0600 Gross per 24 hour  Intake 5679.83 ml  Output 4700 ml  Net 979.83 ml   Filed Weights   01/13/19 0410 01/13/19 0413  Weight: (Abnormal) 178.2 kg (Abnormal) 178.2 kg    Examination: General obese 54 year old female currently in the prone position on heavy  sedation HEENT orally intubated Pulmonary equal chest rise remarkably diminished bilaterally Ventilator settings: Broussard pressure25 Impression 13 Cardiac regular rate and rhythm Abdomen soft obese nontender Extremities warm and dry brisk cap  refill GU clear yellow Neuro heavily sedated, requiring as needed neuromuscular blockade     Resolved Hospital Problem list     Assessment & Plan:  Septic shock 1/7; may be a cardiogenic component  cultures still pending, etiology not clear, white blood cell count coming down 52 Plan Day #6 vancomycin, I think we can discontinue this as she has had no staph growth thus far, and MRSA PCR was negative Day #2 meropenem and Cipro CVP goal 8-10 Mean arterial pressure goal greater than 65 Follow-up pending culture data Check SVO 2  & BNP, Will consider adding low-dose dobutamine  Acute hypoxic respiratory failure with ARDS due to COVID, further complicated by element of pulmonary edema and new systemic cardiomyopathy superimposed on underlying obesity hypoventilation syndrome and sleep apnea Supine arterial blood gas reviewed acceptable Plan  Continue low tidal volume ventilation at 6 mL/kg predicted body weight Plateau pressure goal less than 30, driving pressure goal less than 15 Continuing prone protocol as long as 3/F ratio less than 150 Reassess daily for diuresis VAP bundle PAD protocol, RASS goal -4 Continue sedating drips currently on**fentanyl and Versed, clonazepam and oxycodone added on 1/7 F/u LE dopplers  Systolic cardiomyopathy, newly reduced EF down to 45 to [id number], grade 1 diastolic heart failure Plan Continue telemetry monitoring Holding diuresis for today given shock state and renal injury  Fluid and electrolyte imbalance: Mild hypernatremia Plan Free water replacement  Mild AKI Plan MAP goal >65 Renal adjust medications A.m. chemistry Strict intake output  DM2 with hyperglycemia Plan Sliding scale insulin and basal insulin per primary service  Tube feeding: Plan Continue tube feeds    Best practice:  Diet: bolue feeding Pain/Anxiety/Delirium protocol (if indicated): as above VAP protocol (if indicated): yes DVT prophylaxi: lovenox 0.5mg /kg per  protocol GI prophylaxis: pepcid for stress ulcer prophylaxis Glucose control: SSI Mobility: bed rest Code Status: full Family Communication: per [facility] Disposition: remain in ICU   Critical care time: 34 minutes    Erick Colace ACNP-BC [place] Pager # [phone] OR # [phone] if no answer

## 2019-01-19 NOTE — Progress Notes (Signed)
Pt placed supine at this time with no complications. Pt ett in proper position secured with cloth tape

## 2019-01-19 NOTE — Progress Notes (Signed)
[facility] PROGRESS NOTE    Progress Note  Kaitlin [name]  VXB:[id number] DOB: 1965-06-04 DOA: 01/12/2019 PCP: Olga Coaster, [facility]     Brief Narrative:   Kaitlin [name] is an 54 y.o. 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  In the ED she was found to be hypoxic with a chest x-ray with bilateral infiltrates.  Assessment/Plan:   Acute respiratory failure with hypoxia secondarily to Pneumonia due to COVID-19 virus:  Recent Labs  Lab 01/12/19 1838 01/13/19 0823 01/13/19 0823 01/14/19 0147 01/14/19 0500 01/15/19 0135 01/15/19 0343 01/16/19 0545 01/17/19 0500 01/18/19 0120 01/19/19 0452  DDIMER 1.54* 1.29*  --  1.23*  --  1.60*  --  2.52* 3.66* 7.03*  --   FERRITIN 473*  --   --   --   --   --   --   --   --   --   --   CRP 19.5* 19.3*  --   --  15.8* 8.2*  --  4.2* 3.8* 2.0*  --   ALT 37 33   < > 37  --  38  --  33 31 53* 90*  PROCALCITON <0.10 0.10  --   --   --   --  <0.10 <0.10 <0.10  --   --    < > = values in this interval not displayed.   She was placed on remdesivir and steroids.  She also received 1 dose of Actemra.  She was also covered empirically with vancomycin and cefepime due to concern for secondary bacterial infection.    Patient remains intubated and sedated.  In prone position.  Pulmonology is following.  Inflammatory markers improved.  D-dimer was noted to be elevated at 7.  Doppler studies were ordered.  Defer CT angiogram to pulmonology.  Patient on scheduled furosemide.  Acute on chronic systolic and diastolic congestive heart failure Echocardiogram shows EF of 45%. Patient with known diastolic dysfunction.  Continue furosemide.  Ins and outs.  Daily weights.  Septic  shock Patient continues to require pressors.  Carvedilol was discontinued.  She is also on stress dose steroids.  Critical care medicine is monitoring and managing.     Insulin-dependent diabetes mellitus type 2: Patient on insulin pump at home.  Blood glucose levels remained out of control despite Lantus and aggressive SSI.  Transitioned to intravenous insulin infusion.  Continue to monitor.  HbA1c 7.1.    HLD (hyperlipidemia): Continue statins.  Morbid obesity Estimated body mass index is 58.02 kg/m as calculated from the following:   Height as of this encounter: 5' 9" (1.753 m).   Weight as of this encounter: 178.2 kg.  Nutrition On tube feedings.   DVT prophylaxis: On Lovenox 85 mg every 12 hours CODE STATUS: Full code Family Communication: PCCM to reach out to family Disposition Plan: Remain in ICU for now.  Disposition will depend on her clinical course in the hospital.    IV Access:    Peripheral IV   Procedures and diagnostic studies:   Transthoracic echocardiogram 01/16/2019  1. Left ventricular ejection fraction, by visual estimation, is 45 to 50%. The left ventricle has mildly decreased function. There is moderately increased left ventricular hypertrophy.  2. Left ventricular diastolic parameters are indeterminate.  3. Global right  ventricle was not well visualized.The right ventricular size is not well visualized. Right vetricular wall thickness was not assessed.  4. Left atrial size was normal.  5. The mitral valve is normal in structure. Trivial mitral valve regurgitation. No evidence of mitral stenosis.  6. The tricuspid valve is not well visualized.  7. The aortic valve was not well visualized. Aortic valve regurgitation is not visualized. No evidence of aortic valve sclerosis or stenosis.  8. The pulmonic valve was not well visualized. Pulmonic valve regurgitation is trivial.   Radiology Reports DG Chest Port 1 View  Result Date: 01/19/2019 CLINICAL DATA:   Acute respiratory failure secondary to COVID-19 pneumonia. EXAM: PORTABLE CHEST 1 VIEW COMPARISON:  Single-view of the chest 01/18/2019 and 01/16/2019. FINDINGS: The patient's endotracheal tube has backed out and is now just above the clavicular heads. The tube should be advanced approximately 2 cm for better positioning. Left IJ catheter is again seen. Diffuse bilateral airspace disease is not grossly changed. Heart size is normal. IMPRESSION: Endotracheal tube tip is now just above the clavicular heads. The tube should be advanced 2 cm. No marked change in extensive bilateral airspace disease. These results will be called to the ordering clinician or representative by the Radiologist Assistant, and communication documented in the PACS or zVision Dashboard. Electronically Signed   By: Inge Rise M.D.   On: 01/19/2019 07:30   DG Chest Port 1 View  Result Date: 01/18/2019 CLINICAL DATA:  Acute respiratory failure EXAM: PORTABLE CHEST 1 VIEW COMPARISON:  Radiograph 01/16/2019, CT 05/15/2018 FINDINGS: *Endotracheal tube terminates in the mid to lower trachea, similar position to comparison though difficult to assess distance from the carina given poor definition of the tracheal borders. *Transesophageal tip terminates below the level of imaging, beyond the GE junction. *Left IJ catheter tip terminates at the right atrium. Diminishing lung volumes with worsening bilateral interstitial and airspace opacities throughout both lungs. More superimposed areas of bandlike opacity likely reflect subsegmental atelectatic change. 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 No acute osseous or soft tissue abnormality. IMPRESSION: 1. Diminishing lung volumes with worsening interstitial and airspace opacities throughout both lungs. 2. Grossly stable appearance of the support devices. Electronically Signed    By: Lovena Le M.D.   On: 01/18/2019 05:35     Medical Consultants:    Pulmonology.  Anti-Infectives:   Anti-infectives (From admission, onward)   Start     Dose/Rate Route Frequency Ordered Stop   01/18/19 2300  vancomycin (VANCOCIN) IVPB 1000 mg/200 mL premix     1,000 mg 200 mL/hr over 60 Minutes Intravenous Every 12 hours 01/18/19 1631     01/18/19 1000  meropenem (MERREM) 1 g in sodium chloride 0.9 % 100 mL IVPB     1 g 200 mL/hr over 30 Minutes Intravenous Every 8 hours 01/18/19 0903     01/18/19 1000  ciprofloxacin (CIPRO) IVPB 400 mg     400 mg 200 mL/hr over 60 Minutes Intravenous Every 12 hours 01/18/19 0903     01/16/19 1100  vancomycin (VANCOREADY) IVPB 1500 mg/300 mL  Status:  Discontinued     1,500 mg 150 mL/hr over 120 Minutes Intravenous Every 12 hours 01/15/19 1633 01/18/19 1631   01/16/19 0000  vancomycin (VANCOCIN) IVPB 1000 mg/200 mL premix     1,000 mg 200 mL/hr over 60 Minutes Intravenous  Once 01/15/19 2318 01/16/19 1436   01/15/19 1700  ceFEPIme (MAXIPIME)  2 g in sodium chloride 0.9 % 100 mL IVPB  Status:  Discontinued     2 g 200 mL/hr over 30 Minutes Intravenous Every 8 hours 01/15/19 1634 01/18/19 0902   01/15/19 1645  vancomycin (VANCOCIN) 2,500 mg in sodium chloride 0.9 % 500 mL IVPB  Status:  Discontinued     2,500 mg 250 mL/hr over 120 Minutes Intravenous  Once 01/15/19 1633 01/16/19 1435   01/14/19 1000  remdesivir 100 mg in sodium chloride 0.9 % 100 mL IVPB     100 mg 200 mL/hr over 30 Minutes Intravenous Daily 01/12/19 2341 01/17/19 1100   01/13/19 0300  remdesivir 200 mg in sodium chloride 0.9% 250 mL IVPB     200 mg 580 mL/hr over 30 Minutes Intravenous Once 01/12/19 2341 01/13/19 0715       Subjective:    Patient in prone position.  Intubated and sedated.   Objective:    Vitals:   01/19/19 0815 01/19/19 1145 01/19/19 1200 01/19/19 1215  BP:   133/85   Pulse: (!) 110 (!) 119 (!) 130 (!) 132  Resp: (!) 24 (!) 24 (!) 24 (!)  24  Temp:   (!) 101.8 F (38.8 C)   TempSrc:   Axillary   SpO2: 96%     Weight:      Height:       SpO2: 96 % O2 Flow Rate (L/min): 15 L/min FiO2 (%): (S) 60 %(decreased based on spo2)   Intake/Output Summary (Last 24 hours) at 01/19/2019 1323 Last data filed at 01/19/2019 1300 Gross per 24 hour  Intake 3402.75 ml  Output 3460 ml  Net -57.25 ml   Filed Weights   01/13/19 0410 01/13/19 0413  Weight: (!) 178.2 kg (!) 178.2 kg    General appearance: Intubated sedated.  Prone position. Resp: Crackles bilaterally.  No wheezing or rhonchi. Cardio: Unable to examine due to prone position  GI: Able to examine due to prone position Neurologic: Intubated and sedated    Data Reviewed:    Labs: Basic Metabolic Panel: Recent Labs  Lab 01/13/19 0823 01/15/19 0135 01/16/19 0545 01/16/19 0718 01/16/19 2040 01/17/19 0500 01/17/19 1733 01/18/19 0120 01/18/19 1131 01/18/19 1137 01/18/19 1752 01/19/19 0452  NA 141 141 138  --   --  143  --  144 146* 146* 145 147*  K 4.1 4.6 4.5  --   --  3.8  --  4.4 4.5 4.2 4.3 3.8  CL 104 103 97*  --   --  100  --  103  --   --   --  108  CO2 _0 --   --  32  --  29  --   --   --  29  GLUCOSE 149* 235* 256*  --   --  120*  --  256*  --   --   --  267*  BUN 17 21* 23*  --   --  27*  --  38*  --   --   --  45*  CREATININE 0.76 0.86 1.05*  --   --  0.89  --  1.03*  --   --   --  1.10*  CALCIUM 8.4* 8.9 8.8*  --   --  8.5*  --  8.7*  --   --   --  8.6*  MG 2.2  --   --  2.1 2.2 2.2 2.1  --   --   --   --   --  PHOS  --   --   --  3.5 3.9 4.1 4.4  --   --   --   --   --    GFR Estimated Creatinine Clearance: 106 mL/min (A) (by C-G formula based on SCr of 1.1 mg/dL (H)).  Liver Function Tests: Recent Labs  Lab 01/15/19 0135 01/16/19 0545 01/17/19 0500 01/18/19 0120 01/19/19 0452  AST 43* 45* 42* 70* 113*  ALT 38 33 31 53* 90*  ALKPHOS 64 72 81 86 79  BILITOT 0.4 0.4 0.6 0.7 0.5  PROT 6.7 6.3* 6.2* 6.6 5.8*  ALBUMIN 2.7* 2.8*  2.8* 2.9* 2.6*   COVID-19 Labs  Recent Labs    01/17/19 0500 01/18/19 0120  DDIMER 3.66* 7.03*  CRP 3.8* 2.0*    Lab Results  Component Value Date   SARSCOV2NAA NEGATIVE 11/19/2018    CBC: Recent Labs  Lab 01/15/19 0135 01/16/19 0545 01/17/19 0500 01/18/19 0120 01/18/19 1131 01/18/19 1137 01/18/19 1752 01/19/19 0452  WBC 7.5 7.5 10.8* 15.7*  --   --   --  14.1*  NEUTROABS 4.8 5.1 7.2 12.8*  --   --   --  10.7*  HGB 13.6 13.8 13.5 14.8 16.0* 15.6* 14.6 14.1  HCT 41.6 41.8 41.6 47.1* 47.0* 46.0 43.0 43.9  MCV 77.0* 76.0* 76.6* 77.9*  --   --   --  79.4*  PLT 305 363 472* 581*  --   --   --  444*   CBG: Recent Labs  Lab 01/19/19 0720 01/19/19 0823 01/19/19 0925 01/19/19 1027 01/19/19 1120  GLUCAP 219* 202* 211* 273* 252*   D-Dimer: Recent Labs    01/17/19 0500 01/18/19 0120  DDIMER 3.66* 7.03*   Lipid Profile: Recent Labs    01/18/19 0120 01/19/19 0452  TRIG 270* 257*   Sepsis Labs: Recent Labs  Lab 01/12/19 1838 01/13/19 0823 01/15/19 0343 01/16/19 0545 01/17/19 0500 01/18/19 0120 01/19/19 0452  PROCALCITON <0.10 0.10 <0.10 <0.10 <0.10  --   --   WBC 8.7 6.1  --  7.5 10.8* 15.7* 14.1*  LATICACIDVEN 1.1  --   --   --   --   --   --    Microbiology Recent Results (from the past 240 hour(s))  Blood Culture (routine x 2)     Status: None   Collection Time: 01/12/19  6:38 PM   Specimen: BLOOD  Result Value Ref Range Status   Specimen Description   Final    BLOOD RIGHT ANTECUBITAL Performed at [facility], [address] [place], [place] [address]    Special Requests   Final    BOTTLES DRAWN AEROBIC AND ANAEROBIC Blood Culture adequate volume Performed at [facility], [address] [place], [place] [address]    Culture   Final    NO GROWTH 5 DAYS Performed at [facility], [place] [address], [place], [place] [address]    Report Status 01/17/2019 FINAL  Final  SARS Coronavirus 2 Ag (30 min TAT) - Nasal  Swab (BD Veritor Kit)     Status: Abnormal   Collection Time: 01/12/19  6:38 PM   Specimen: Nasal Swab (BD Veritor Kit)  Result Value Ref Range Status   SARS Coronavirus 2 Ag POSITIVE (A) NEGATIVE Final    Comment: RESULT CALLED TO, READ BACK BY AND VERIFIED WITH: Marsa Aris RN AT 1942 ON 01/12/19 BY [email or web address] (NOTE) SARS-CoV-2 antigen PRESENT. Positive results indicate the presence of viral  antigens, but clinical correlation with patient history and other diagnostic information is necessary to determine patient infection status.  Positive results do not rule out bacterial infection or co-infection  with other viruses. False positive results are rare but can occur, and confirmatory RT-PCR testing may be appropriate in some circumstances. The expected result is Negative. Fact Sheet for Patients: PodPark.tn Fact Sheet for Providers: GiftContent.is  This test is not yet approved or cleared by the [place] FDA and  has been authorized for detection and/or diagnosis of SARS-CoV-2 by FDA under an Emergency Use Authorization (EUA).  This EUA will remain in effect (meaning this test can be used) for the duration of  the COVID-19  declaration under Section 564(b)(1) of the Act, 21 U.S.C. section 360bbb-3(b)(1), unless the authorization is terminated or revoked sooner. Performed at [facility], [address] [place], [place] [address]   Blood Culture (routine x 2)     Status: None   Collection Time: 01/12/19  7:36 PM   Specimen: BLOOD  Result Value Ref Range Status   Specimen Description   Final    BLOOD LEFT ANTECUBITAL Performed at [facility], [address] [place], [place] [address]    Special Requests   Final    BOTTLES DRAWN AEROBIC AND ANAEROBIC Blood Culture adequate volume Performed at [facility], [address] [place], [place] [address]    Culture   Final    NO GROWTH 5  DAYS Performed at [facility], [place] [address], [place], [place] [address]    Report Status 01/17/2019 FINAL  Final  MRSA PCR Screening     Status: None   Collection Time: 01/18/19 10:00 AM   Specimen: Nasopharyngeal  Result Value Ref Range Status   MRSA by PCR NEGATIVE NEGATIVE Final    Comment:        The GeneXpert MRSA Assay (FDA approved for NASAL specimens only), is one component of a comprehensive MRSA colonization surveillance program. It is not intended to diagnose MRSA infection nor to guide or monitor treatment for MRSA infections. Performed at [facility], [place] [address], [place], [place] [address]   Culture, blood (routine x 2)     Status: None (Preliminary result)   Collection Time: 01/18/19 11:58 AM   Specimen: BLOOD  Result Value Ref Range Status   Specimen Description   Final    BLOOD LEFT ARM Performed at [facility] [address] [place], [place] [address]    Special Requests   Final    BOTTLES DRAWN AEROBIC AND ANAEROBIC Blood Culture adequate volume Performed at [place] [address], [place], [place] [address]    Culture   Final    NO GROWTH < 24 HOURS Performed at [place] [address], [place], [place] [address]    Report Status PENDING  Incomplete  Culture, blood (routine x 2)     Status: None (Preliminary result)   Collection Time: 01/18/19 12:05 PM   Specimen: BLOOD  Result Value Ref Range Status   Specimen Description   Final    BLOOD LEFT ARM Performed at [facility] [address] [place], [place] [address]    Special Requests   Final    BOTTLES DRAWN AEROBIC AND ANAEROBIC Blood Culture adequate volume Performed at [place] [address], [place], [place] [address]    Culture  Final    NO GROWTH < 24 HOURS Performed at [facility], [place] [address] [place], [place] [address]    Report Status  PENDING  Incomplete  Culture, respiratory (non-expectorated)     Status: None (Preliminary result)   Collection Time: 01/18/19  3:25 PM   Specimen: Tracheal Aspirate; Respiratory  Result Value Ref Range Status   Specimen Description   Final    TRACHEAL ASPIRATE Performed at [facility] [address], [place], [place] [address]    Special Requests   Final    NONE Performed at [facility], [place] [address], [place], [place] [address]    Gram Stain   Final    FEW WBC PRESENT, PREDOMINANTLY PMN NO ORGANISMS SEEN    Culture   Final    NO GROWTH 1 DAY Performed at [facility] [address] [place], [place] [address]    Report Status PENDING  Incomplete     Medications:   . artificial tears  1 application Both Eyes T2W  . vitamin C  500 mg Per Tube Daily  . aspirin  81 mg Per Tube Daily  . chlorhexidine  15 mL Mouth/Throat BID  . Chlorhexidine Gluconate Cloth  6 each Topical Daily  . clonazePAM  2 mg Oral BID  . enoxaparin (LOVENOX) injection  85 mg Subcutaneous Q12H  . famotidine  20 mg Per Tube BID  . feeding supplement (PRO-STAT SUGAR FREE 64)  60 mL Per Tube TID  . feeding supplement (VITAL 1.5 CAL)  1,000 mL Per Tube Q24H  . furosemide  80 mg Intravenous Q12H  . hydrocortisone sod succinate (SOLU-CORTEF) inj  50 mg Intravenous Q6H  . ipratropium-albuterol  3 mL Nebulization Q6H  . mouth rinse  15 mL Mouth Rinse 10 times per day  . oxyCODONE  10 mg Oral Q6H  . rosuvastatin  20 mg Per Tube QPM  . cholecalciferol  1,000 Units Per Tube Daily  . zinc sulfate  220 mg Per Tube Daily   Continuous Infusions: . sodium chloride 75 mL/hr at 01/19/19 0600  . ciprofloxacin Stopped (01/19/19 1048)  . dextrose 5 % and 0.45% NaCl 75 mL/hr at 01/19/19 0535  . fentaNYL infusion INTRAVENOUS 300 mcg/hr (01/19/19 1047)  . insulin 15 Units/hr (01/19/19 1234)  . meropenem (MERREM) IV Stopped (01/19/19 5809)  . midazolam 10 mg/hr (01/19/19  0851)  . norepinephrine (LEVOPHED) Adult infusion 8 mcg/min (01/19/19 0800)  . phenylephrine (NEO-SYNEPHRINE) Adult infusion Stopped (01/18/19 1149)  . vancomycin 1,000 mg (01/19/19 1236)  . vasopressin (PITRESSIN) infusion - *FOR SHOCK* Stopped (01/18/19 1243)     LOS: 7 days   [employer]  [facility]  01/19/2019, 1:23 PM

## 2019-01-19 NOTE — Progress Notes (Signed)
I updated the patient's daughter over the phone.  We discussed the events of yesterday, current findings for today, and plan for the weekend.  She understands due to staffing model medical staff will be reaching out on a as needed basis over the weekend.  We will touch base with her again on Monday and discuss the events of the weekend, current findings, and plan for the week as well as answer any questions.  I have assured her that the nursing staff will be updated on plan of care and feel to answer routine questions over the weekend  Erick Colace ACNP-BC [place] Pager # [phone] OR # [phone] if no answer

## 2019-01-19 NOTE — Progress Notes (Signed)
RT NOTE:  Pt's head turned to the right. ETT secure throughout

## 2019-01-19 NOTE — Progress Notes (Signed)
Bilateral lower extremity venous duplex completed. Refer to "CV Proc" under chart review to view preliminary results.  01/19/2019 5:25 PM Kelby Aline., MHA, RVT, RDCS, RDMS

## 2019-01-19 NOTE — Progress Notes (Addendum)
Pt prone at this time with no complications.  ETT taped and secured @ 22 with cloth tape.  Foam padding added to cheeks and upper lip. Head turned to the left.

## 2019-01-20 ENCOUNTER — Inpatient Hospital Stay (HOSPITAL_COMMUNITY): Payer: Medicaid Other

## 2019-01-20 LAB — CBC
HCT: 40.1 % (ref 36.0–46.0)
Hemoglobin: 12.6 g/dL (ref 12.0–15.0)
MCH: 25.4 pg — ABNORMAL LOW (ref 26.0–34.0)
MCHC: 31.4 g/dL (ref 30.0–36.0)
MCV: 80.7 fL (ref 80.0–100.0)
Platelets: 386 10*3/uL (ref 150–400)
RBC: 4.97 MIL/uL (ref 3.87–5.11)
RDW: 17.7 % — ABNORMAL HIGH (ref 11.5–15.5)
WBC: 14.9 10*3/uL — ABNORMAL HIGH (ref 4.0–10.5)
nRBC: 0.1 % (ref 0.0–0.2)

## 2019-01-20 LAB — GLUCOSE, CAPILLARY
Glucose-Capillary: 141 mg/dL — ABNORMAL HIGH (ref 70–99)
Glucose-Capillary: 156 mg/dL — ABNORMAL HIGH (ref 70–99)
Glucose-Capillary: 173 mg/dL — ABNORMAL HIGH (ref 70–99)
Glucose-Capillary: 174 mg/dL — ABNORMAL HIGH (ref 70–99)
Glucose-Capillary: 181 mg/dL — ABNORMAL HIGH (ref 70–99)
Glucose-Capillary: 182 mg/dL — ABNORMAL HIGH (ref 70–99)
Glucose-Capillary: 183 mg/dL — ABNORMAL HIGH (ref 70–99)
Glucose-Capillary: 187 mg/dL — ABNORMAL HIGH (ref 70–99)
Glucose-Capillary: 233 mg/dL — ABNORMAL HIGH (ref 70–99)
Glucose-Capillary: 241 mg/dL — ABNORMAL HIGH (ref 70–99)
Glucose-Capillary: 242 mg/dL — ABNORMAL HIGH (ref 70–99)
Glucose-Capillary: 244 mg/dL — ABNORMAL HIGH (ref 70–99)
Glucose-Capillary: 245 mg/dL — ABNORMAL HIGH (ref 70–99)
Glucose-Capillary: 250 mg/dL — ABNORMAL HIGH (ref 70–99)
Glucose-Capillary: 258 mg/dL — ABNORMAL HIGH (ref 70–99)
Glucose-Capillary: 277 mg/dL — ABNORMAL HIGH (ref 70–99)
Glucose-Capillary: 283 mg/dL — ABNORMAL HIGH (ref 70–99)
Glucose-Capillary: 342 mg/dL — ABNORMAL HIGH (ref 70–99)
Glucose-Capillary: 393 mg/dL — ABNORMAL HIGH (ref 70–99)
Glucose-Capillary: 420 mg/dL — ABNORMAL HIGH (ref 70–99)
Glucose-Capillary: 421 mg/dL — ABNORMAL HIGH (ref 70–99)

## 2019-01-20 LAB — POCT I-STAT 7, (LYTES, BLD GAS, ICA,H+H)
Acid-Base Excess: 9 mmol/L — ABNORMAL HIGH (ref 0.0–2.0)
Bicarbonate: 36.2 mmol/L — ABNORMAL HIGH (ref 20.0–28.0)
Calcium, Ion: 1.3 mmol/L (ref 1.15–1.40)
HCT: 37 % (ref 36.0–46.0)
Hemoglobin: 12.6 g/dL (ref 12.0–15.0)
O2 Saturation: 89 %
Potassium: 4.2 mmol/L (ref 3.5–5.1)
Sodium: 151 mmol/L — ABNORMAL HIGH (ref 135–145)
TCO2: 38 mmol/L — ABNORMAL HIGH (ref 22–32)
pCO2 arterial: 60 mmHg — ABNORMAL HIGH (ref 32.0–48.0)
pH, Arterial: 7.388 (ref 7.350–7.450)
pO2, Arterial: 59 mmHg — ABNORMAL LOW (ref 83.0–108.0)

## 2019-01-20 LAB — COMPREHENSIVE METABOLIC PANEL
ALT: 88 U/L — ABNORMAL HIGH (ref 0–44)
AST: 80 U/L — ABNORMAL HIGH (ref 15–41)
Albumin: 2.6 g/dL — ABNORMAL LOW (ref 3.5–5.0)
Alkaline Phosphatase: 67 U/L (ref 38–126)
Anion gap: 9 (ref 5–15)
BUN: 47 mg/dL — ABNORMAL HIGH (ref 6–20)
CO2: 32 mmol/L (ref 22–32)
Calcium: 8.5 mg/dL — ABNORMAL LOW (ref 8.9–10.3)
Chloride: 105 mmol/L (ref 98–111)
Creatinine, Ser: 1.08 mg/dL — ABNORMAL HIGH (ref 0.44–1.00)
GFR calc Af Amer: >60 mL/min (ref >60)
GFR calc non Af Amer: 59 mL/min — ABNORMAL LOW (ref >60)
Glucose, Bld: 345 mg/dL — ABNORMAL HIGH (ref 70–99)
Potassium: 3.8 mmol/L (ref 3.5–5.1)
Sodium: 146 mmol/L — ABNORMAL HIGH (ref 135–145)
Total Bilirubin: 0.6 mg/dL (ref 0.3–1.2)
Total Protein: 5.5 g/dL — ABNORMAL LOW (ref 6.5–8.1)

## 2019-01-20 LAB — CULTURE, RESPIRATORY W GRAM STAIN: Culture: NO GROWTH

## 2019-01-20 MED ORDER — POTASSIUM CHLORIDE 20 MEQ PO PACK
40.0000 meq | PACK | Freq: Once | ORAL | Status: AC
  Administered 2019-01-20: 40 meq via ORAL
  Filled 2019-01-20: qty 2

## 2019-01-20 MED ORDER — INSULIN ASPART 100 UNIT/ML ~~LOC~~ SOLN
4.0000 [IU] | SUBCUTANEOUS | Status: DC
  Administered 2019-01-20 – 2019-01-21 (×3): 4 [IU] via SUBCUTANEOUS

## 2019-01-20 MED ORDER — INSULIN ASPART 100 UNIT/ML ~~LOC~~ SOLN
0.0000 [IU] | SUBCUTANEOUS | Status: DC
  Administered 2019-01-20 (×2): 4 [IU] via SUBCUTANEOUS
  Administered 2019-01-21: 15 [IU] via SUBCUTANEOUS

## 2019-01-20 MED ORDER — BACITRACIN-NEOMYCIN-POLYMYXIN OINTMENT TUBE
TOPICAL_OINTMENT | Freq: Every day | CUTANEOUS | Status: DC
  Administered 2019-01-21 – 2019-01-22 (×2): 1 via TOPICAL
  Filled 2019-01-20: qty 14.17

## 2019-01-20 MED ORDER — INSULIN DETEMIR 100 UNIT/ML ~~LOC~~ SOLN
50.0000 [IU] | Freq: Two times a day (BID) | SUBCUTANEOUS | Status: DC
  Filled 2019-01-20 (×2): qty 0.5

## 2019-01-20 MED ORDER — DEXTROSE 10 % IV SOLN: INTRAVENOUS | Status: DC

## 2019-01-20 MED ORDER — HYDROCORTISONE NA SUCCINATE PF 100 MG IJ SOLR
50.0000 mg | Freq: Three times a day (TID) | INTRAMUSCULAR | Status: DC
  Administered 2019-01-21 – 2019-01-22 (×5): 50 mg via INTRAVENOUS
  Filled 2019-01-20 (×5): qty 2

## 2019-01-20 MED ORDER — INSULIN DETEMIR 100 UNIT/ML ~~LOC~~ SOLN
50.0000 [IU] | Freq: Two times a day (BID) | SUBCUTANEOUS | Status: DC
  Administered 2019-01-20: 50 [IU] via SUBCUTANEOUS
  Filled 2019-01-20 (×2): qty 0.5

## 2019-01-20 MED ORDER — NOREPINEPHRINE 4 MG/250ML-% IV SOLN
INTRAVENOUS | Status: AC
  Administered 2019-01-20: 2 ug/min via INTRAVENOUS
  Filled 2019-01-20: qty 250

## 2019-01-20 NOTE — Progress Notes (Signed)
[facility] PROGRESS NOTE    Progress Note  Kaitlin [name]  PHX:[id number] DOB: 07-Nov-1965 DOA: 01/12/2019 PCP: Olga Coaster, [facility]     Brief Narrative:   Kaitlin [name] is an 54 y.o. 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  In the ED she was found to be hypoxic with a chest x-ray with bilateral infiltrates.  Assessment/Plan:   Acute respiratory failure with hypoxia secondarily to Pneumonia due to COVID-19 virus:  Recent Labs  Lab 01/14/19 0147 01/14/19 0500 01/15/19 0135 01/15/19 0343 01/16/19 0545 01/17/19 0500 01/18/19 0120 01/19/19 0452 01/20/19 0500  DDIMER 1.23*  --  1.60*  --  2.52* 3.66* 7.03*  --   --   CRP  --  15.8* 8.2*  --  4.2* 3.8* 2.0*  --   --   ALT 37  --  38  --  33 31 53* 90* 88*  PROCALCITON  --   --   --  <0.10 <0.10 <0.10  --   --   --    She was placed on remdesivir and steroids.  She also received 1 dose of Actemra.  Patient continued to have high oxygen requirements and then she acutely decompensated.  She was transferred to the ICU and was intubated.   Patient was started on broad-spectrum antibiotics due to concern for secondary bacterial infection.  She had high WBC.  She was febrile.  Blood pressure also was low.  Pulmonology is following.  She remains intubated and sedated.  Has shown some improvement in the last 48 hours.  Hemodynamics have improved.  Oxygen requirements have improved.  Acute on chronic systolic and diastolic congestive heart failure Echocardiogram shows EF of 45%. Patient with known diastolic dysfunction.  Patient remains on scheduled furosemide.  Continue ins and outs.  Daily weights.   Septic shock Patient seems to be off of pressors this morning.   She remains on stress dose steroids.  Carvedilol was discontinued.  If blood pressure remains stable over the next 24 hours then we could cut back on the dose of her hydrocortisone.  Insulin-dependent diabetes mellitus type 2: Patient on insulin pump at home.  Blood sugars remain poorly controlled.  She is back on insulin infusion this morning.  HbA1c 7.1.     HLD (hyperlipidemia): Continue statins.  Morbid obesity Estimated body mass index is 52.38 kg/m as calculated from the following:   Height as of this encounter: '5\' 9"'  (1.753 m).   Weight as of this encounter: 160.9 kg.  Nutrition On tube feedings.  Transaminitis Secondary to COVID-19.   DVT prophylaxis: On Lovenox 85 mg every 12 hours CODE STATUS: Full code Family Communication: PCCM has been updating family. Disposition Plan: Remain in ICU for now.  Disposition will depend on her clinical course in the hospital.    IV Access:    Peripheral IV   Procedures and diagnostic studies:   Transthoracic echocardiogram 01/16/2019  1. Left ventricular ejection fraction, by visual estimation, is 45 to 50%. The left ventricle has mildly decreased function. There is moderately increased left ventricular hypertrophy.  2. Left ventricular diastolic parameters are indeterminate.  3. Global right ventricle was not well visualized.The right ventricular size is not well visualized. Right vetricular wall thickness was not assessed.  4. Left atrial  size was normal.  5. The mitral valve is normal in structure. Trivial mitral valve regurgitation. No evidence of mitral stenosis.  6. The tricuspid valve is not well visualized.  7. The aortic valve was not well visualized. Aortic valve regurgitation is not visualized. No evidence of aortic valve sclerosis or stenosis.  8. The pulmonic valve was not well visualized. Pulmonic valve regurgitation is trivial.   Radiology Reports DG CHEST PORT 1 VIEW  Result Date: 01/20/2019 CLINICAL DATA:   Hypoxia with respiratory failure EXAM: PORTABLE CHEST 1 VIEW COMPARISON:  January 18, 2009 FINDINGS: Patient is rotated. There has been significant clearing of consolidation from the lungs bilaterally. There remains atelectatic change in the right base. The lungs elsewhere appear clear. Heart is upper normal in size with pulmonary vascularity normal. Feeding tube tip is below the diaphragm. Patient is status post median sternotomy. No overt adenopathy. IMPRESSION: Significant clearing bilaterally. Atelectasis right base. Lungs elsewhere clear. Stable cardiac silhouette. Electronically Signed   By: Lowella Grip III M.D.   On: 01/20/2019 12:03   DG Chest Port 1 View  Result Date: 01/19/2019 CLINICAL DATA:  Acute respiratory failure secondary to COVID-19 pneumonia. EXAM: PORTABLE CHEST 1 VIEW COMPARISON:  Single-view of the chest 01/18/2019 and 01/16/2019. FINDINGS: The patient's endotracheal tube has backed out and is now just above the clavicular heads. The tube should be advanced approximately 2 cm for better positioning. Left IJ catheter is again seen. Diffuse bilateral airspace disease is not grossly changed. Heart size is normal. IMPRESSION: Endotracheal tube tip is now just above the clavicular heads. The tube should be advanced 2 cm. No marked change in extensive bilateral airspace disease. These results will be called to the ordering clinician or representative by the Radiologist Assistant, and communication documented in the PACS or zVision Dashboard. Electronically Signed   By: Inge Rise M.D.   On: 01/19/2019 07:30   LE Venous  Result Date: 01/19/2019  Lower Venous Study Indications: COVID positive, elevated d-dimer.  Limitations: 392lb, body habitus and poor ultrasound/tissue interface. Comparison Study: 08/30/2017- negative lower extremity venous duplex. Performing Technologist: Maudry Mayhew MHA, RDMS, RVT, RDCS  Examination Guidelines: A complete evaluation includes B-mode imaging,  spectral Doppler, color Doppler, and power Doppler as needed of all accessible portions of each vessel. Bilateral testing is considered an integral part of a complete examination. Limited examinations for reoccurring indications may be performed as noted.  +---------+---------------+---------+-----------+----------+--------------+ RIGHT    CompressibilityPhasicitySpontaneityPropertiesThrombus Aging +---------+---------------+---------+-----------+----------+--------------+ CFV      Full                    Yes                                 +---------+---------------+---------+-----------+----------+--------------+ SFJ                                                   Not visualized +---------+---------------+---------+-----------+----------+--------------+ FV Prox  Full                                                        +---------+---------------+---------+-----------+----------+--------------+ FV Mid  Not visualized +---------+---------------+---------+-----------+----------+--------------+ FV Distal                                             Not visualized +---------+---------------+---------+-----------+----------+--------------+ PFV                                                   Not visualized +---------+---------------+---------+-----------+----------+--------------+ POP      Full           Yes      Yes                                 +---------+---------------+---------+-----------+----------+--------------+ PTV      Full                    Yes                                 +---------+---------------+---------+-----------+----------+--------------+ Limited visualization of calf veins.  +---------+---------------+---------+-----------+----------+--------------+ LEFT     CompressibilityPhasicitySpontaneityPropertiesThrombus Aging  +---------+---------------+---------+-----------+----------+--------------+ CFV      Full           Yes      Yes                                 +---------+---------------+---------+-----------+----------+--------------+ FV Prox  Full                                                        +---------+---------------+---------+-----------+----------+--------------+ FV Mid   Full                                                        +---------+---------------+---------+-----------+----------+--------------+ FV Distal                                             Not visualized +---------+---------------+---------+-----------+----------+--------------+ PFV                                                   Not visualized +---------+---------------+---------+-----------+----------+--------------+ POP      Full           Yes      Yes                                 +---------+---------------+---------+-----------+----------+--------------+ PTV  Not visualized +---------+---------------+---------+-----------+----------+--------------+ PERO                                                  Not visualized +---------+---------------+---------+-----------+----------+--------------+ Limited visualization of calf veins.    Summary: Right: There is no evidence of deep vein thrombosis in the lower extremity. However, portions of this examination were limited- see technologist comments above. No cystic structure found in the popliteal fossa. Left: There is no evidence of deep vein thrombosis in the lower extremity. However, portions of this examination were limited- see technologist comments above. No cystic structure found in the popliteal fossa.  *See table(s) above for measurements and observations.    Preliminary      Medical Consultants:    Pulmonology.  Anti-Infectives:   Anti-infectives (From admission, onward)    Start     Dose/Rate Route Frequency Ordered Stop   01/18/19 2300  vancomycin (VANCOCIN) IVPB 1000 mg/200 mL premix     1,000 mg 200 mL/hr over 60 Minutes Intravenous Every 12 hours 01/18/19 1631     01/18/19 1000  meropenem (MERREM) 1 g in sodium chloride 0.9 % 100 mL IVPB     1 g 200 mL/hr over 30 Minutes Intravenous Every 8 hours 01/18/19 0903     01/18/19 1000  ciprofloxacin (CIPRO) IVPB 400 mg     400 mg 200 mL/hr over 60 Minutes Intravenous Every 12 hours 01/18/19 0903     01/16/19 1100  vancomycin (VANCOREADY) IVPB 1500 mg/300 mL  Status:  Discontinued     1,500 mg 150 mL/hr over 120 Minutes Intravenous Every 12 hours 01/15/19 1633 01/18/19 1631   01/16/19 0000  vancomycin (VANCOCIN) IVPB 1000 mg/200 mL premix     1,000 mg 200 mL/hr over 60 Minutes Intravenous  Once 01/15/19 2318 01/16/19 1436   01/15/19 1700  ceFEPIme (MAXIPIME) 2 g in sodium chloride 0.9 % 100 mL IVPB  Status:  Discontinued     2 g 200 mL/hr over 30 Minutes Intravenous Every 8 hours 01/15/19 1634 01/18/19 0902   01/15/19 1645  vancomycin (VANCOCIN) 2,500 mg in sodium chloride 0.9 % 500 mL IVPB  Status:  Discontinued     2,500 mg 250 mL/hr over 120 Minutes Intravenous  Once 01/15/19 1633 01/16/19 1435   01/14/19 1000  remdesivir 100 mg in sodium chloride 0.9 % 100 mL IVPB     100 mg 200 mL/hr over 30 Minutes Intravenous Daily 01/12/19 2341 01/17/19 1100   01/13/19 0300  remdesivir 200 mg in sodium chloride 0.9% 250 mL IVPB     200 mg 580 mL/hr over 30 Minutes Intravenous Once 01/12/19 2341 01/13/19 0715       Subjective:    Patient remains intubated and sedated.  Currently in prone position.   Objective:    Vitals:   01/20/19 0915 01/20/19 0930 01/20/19 0945 01/20/19 1200  BP:      Pulse: (!) 101 (!) 105 (!) 103   Resp: (!) 24 (!) 24 (!) 24   Temp:    98.6 F (37 C)  TempSrc:    Axillary  SpO2: 93% 94% 95%   Weight:      Height:       SpO2: 95 % O2 Flow Rate (L/min): 15 L/min FiO2 (%): 50  %   Intake/Output Summary (Last 24 hours) at 01/20/2019 1403 Last data filed  at 01/20/2019 0800 Gross per 24 hour  Intake 3954.13 ml  Output 2225 ml  Net 1729.13 ml   Filed Weights   01/13/19 0410 01/13/19 0413 01/20/19 0500  Weight: (!) 178.2 kg (!) 178.2 kg (!) 160.9 kg    General appearance: Intubated.  In prone position Resp: Crackles bilaterally.  No wheezing or rhonchi. Cardio: Unable to examine as she is in prone position GI: Unable to examine as she is in prone position Extremities: edema bilateral lower extremities Neurologic: Intubated and sedated    Data Reviewed:    Labs: Basic Metabolic Panel: Recent Labs  Lab 01/16/19 0545 01/16/19 0718 01/16/19 2040 01/17/19 0500 01/17/19 1733 01/18/19 0120 01/18/19 1752 01/19/19 0452 01/19/19 1355 01/19/19 1743 01/20/19 0500  NA 138  --   --  143  --  144 145 147* 148* 148* 146*  K 4.5  --   --  3.8  --  4.4 4.3 3.8 4.1 3.9 3.8  CL 97*  --   --  100  --  103  --  108  --   --  105  CO2 29  --   --  32  --  29  --  29  --   --  32  GLUCOSE 256*  --   --  120*  --  256*  --  267*  --   --  345*  BUN 23*  --   --  27*  --  38*  --  45*  --   --  47*  CREATININE 1.05*  --   --  0.89  --  1.03*  --  1.10*  --   --  1.08*  CALCIUM 8.8*  --   --  8.5*  --  8.7*  --  8.6*  --   --  8.5*  MG  --  2.1 2.2 2.2 2.1  --   --   --   --   --   --   PHOS  --  3.5 3.9 4.1 4.4  --   --   --   --   --   --    GFR Estimated Creatinine Clearance: 101.3 mL/min (A) (by C-G formula based on SCr of 1.08 mg/dL (H)).  Liver Function Tests: Recent Labs  Lab 01/16/19 0545 01/17/19 0500 01/18/19 0120 01/19/19 0452 01/20/19 0500  AST 45* 42* 70* 113* 80*  ALT 33 31 53* 90* 88*  ALKPHOS 72 81 86 79 67  BILITOT 0.4 0.6 0.7 0.5 0.6  PROT 6.3* 6.2* 6.6 5.8* 5.5*  ALBUMIN 2.8* 2.8* 2.9* 2.6* 2.6*   COVID-19 Labs  Recent Labs    01/18/19 0120  DDIMER 7.03*  CRP 2.0*    Lab Results  Component Value Date   SARSCOV2NAA NEGATIVE  11/19/2018    CBC: Recent Labs  Lab 01/15/19 0135 01/16/19 0545 01/17/19 0500 01/18/19 0120 01/18/19 1752 01/19/19 0452 01/19/19 1355 01/19/19 1743 01/20/19 0500  WBC 7.5 7.5 10.8* 15.7*  --  14.1*  --   --  14.9*  NEUTROABS 4.8 5.1 7.2 12.8*  --  10.7*  --   --   --   HGB 13.6 13.8 13.5 14.8 14.6 14.1 13.6 13.3 12.6  HCT 41.6 41.8 41.6 47.1* 43.0 43.9 40.0 39.0 40.1  MCV 77.0* 76.0* 76.6* 77.9*  --  79.4*  --   --  80.7  PLT 305 363 472* 581*  --  444*  --   --  386  CBG: Recent Labs  Lab 01/20/19 0936 01/20/19 1025 01/20/19 1127 01/20/19 1229 01/20/19 1321  GLUCAP 258* 250* 245* 242* 181*   D-Dimer: Recent Labs    01/18/19 0120  DDIMER 7.03*   Lipid Profile: Recent Labs    01/18/19 0120 01/19/19 0452  TRIG 270* 257*   Sepsis Labs: Recent Labs  Lab 01/15/19 0343 01/16/19 0545 01/17/19 0500 01/18/19 0120 01/19/19 0452 01/20/19 0500  PROCALCITON <0.10 <0.10 <0.10  --   --   --   WBC  --  7.5 10.8* 15.7* 14.1* 14.9*   Microbiology Recent Results (from the past 240 hour(s))  Blood Culture (routine x 2)     Status: None   Collection Time: 01/12/19  6:38 PM   Specimen: BLOOD  Result Value Ref Range Status   Specimen Description   Final    BLOOD RIGHT ANTECUBITAL Performed at [facility], [address] [place], [place] [address]    Special Requests   Final    BOTTLES DRAWN AEROBIC AND ANAEROBIC Blood Culture adequate volume Performed at [facility], [address] [place], [place] [address]    Culture   Final    NO GROWTH 5 DAYS Performed at [facility], [place] [address] [place], [place] [address]    Report Status 01/17/2019 FINAL  Final  SARS Coronavirus 2 Ag (30 min TAT) - Nasal Swab (BD Veritor Kit)     Status: Abnormal   Collection Time: 01/12/19  6:38 PM   Specimen: Nasal Swab (BD Veritor Kit)  Result Value Ref Range Status   SARS Coronavirus 2 Ag POSITIVE (A) NEGATIVE Final    Comment: RESULT CALLED TO,  READ BACK BY AND VERIFIED WITH: Marsa Aris RN AT 1942 ON 01/12/19 BY [email or web address] (NOTE) SARS-CoV-2 antigen PRESENT. Positive results indicate the presence of viral antigens, but clinical correlation with patient history and other diagnostic information is necessary to determine patient infection status.  Positive results do not rule out bacterial infection or co-infection  with other viruses. False positive results are rare but can occur, and confirmatory RT-PCR testing may be appropriate in some circumstances. The expected result is Negative. Fact Sheet for Patients: PodPark.tn Fact Sheet for Providers: GiftContent.is  This test is not yet approved or cleared by the [place] FDA and  has been authorized for detection and/or diagnosis of SARS-CoV-2 by FDA under an Emergency Use Authorization (EUA).  This EUA will remain in effect (meaning this test can be used) for the duration of  the COVID-19  declaration under Section 564(b)(1) of the Act, 21 U.S.C. section 360bbb-3(b)(1), unless the authorization is terminated or revoked sooner. Performed at [facility], [address] [place], [place] [address]   Blood Culture (routine x 2)     Status: None   Collection Time: 01/12/19  7:36 PM   Specimen: BLOOD  Result Value Ref Range Status   Specimen Description   Final    BLOOD LEFT ANTECUBITAL Performed at [facility], [address] [place], [place] [address]    Special Requests   Final    BOTTLES DRAWN AEROBIC AND ANAEROBIC Blood Culture adequate volume Performed at [facility], [address] [place], [place] [address]    Culture   Final    NO GROWTH 5 DAYS Performed at [facility], [place] [address], [place], [place] [address]    Report Status 01/17/2019 FINAL  Final  MRSA PCR Screening     Status: None   Collection Time: 01/18/19 10:00 AM   Specimen: Nasopharyngeal  Result  Value Ref Range Status   MRSA by PCR NEGATIVE NEGATIVE Final    Comment:        The GeneXpert MRSA Assay (FDA approved for NASAL specimens only), is one component of a comprehensive MRSA colonization surveillance program. It is not intended to diagnose MRSA infection nor to guide or monitor treatment for MRSA infections. Performed at [facility], [place] [address] [place], [place] [address]   Culture, blood (routine x 2)     Status: None (Preliminary result)   Collection Time: 01/18/19 11:58 AM   Specimen: BLOOD  Result Value Ref Range Status   Specimen Description   Final    BLOOD LEFT ARM Performed at [place] [address], [place], [place] [address]    Special Requests   Final    BOTTLES DRAWN AEROBIC AND ANAEROBIC Blood Culture adequate volume Performed at [place] [address], [place], [place] [address]    Culture   Final    NO GROWTH 2 DAYS Performed at [place] [address], [place], [place] [address]    Report Status PENDING  Incomplete  Culture, blood (routine x 2)     Status: None (Preliminary result)   Collection Time: 01/18/19 12:05 PM   Specimen: BLOOD  Result Value Ref Range Status   Specimen Description   Final    BLOOD LEFT ARM Performed at [place] [address], [place], [place] [address]    Special Requests   Final    BOTTLES DRAWN AEROBIC AND ANAEROBIC Blood Culture adequate volume Performed at [place] [address], [place], [place] [address]    Culture   Final    NO GROWTH 2 DAYS Performed at [place] [address], [place], [place] [address]    Report Status PENDING  Incomplete  Culture, respiratory (non-expectorated)     Status: None (Preliminary result)   Collection Time: 01/18/19  3:25 PM   Specimen: Tracheal Aspirate; Respiratory  Result Value Ref Range Status   Specimen Description   Final     TRACHEAL ASPIRATE Performed at [place] [address] [place], [place] [address]    Special Requests   Final    NONE Performed at [facility], [place] [address], [place], [place] [address]    Gram Stain   Final    FEW WBC PRESENT, PREDOMINANTLY PMN NO ORGANISMS SEEN    Culture   Final    NO GROWTH 2 DAYS Performed at [place] [address], [place],  [address]    Report Status PENDING  Incomplete     Medications:   . artificial tears  1 application Both Eyes J2Q  . vitamin C  500 mg Per Tube Daily  . aspirin  81 mg Per Tube Daily  . chlorhexidine  15 mL Mouth/Throat BID  . Chlorhexidine Gluconate Cloth  6 each Topical Daily  . clonazePAM  2 mg Oral BID  . enoxaparin (LOVENOX) injection  85 mg Subcutaneous Q12H  . famotidine  20 mg Per Tube BID  . feeding supplement (PRO-STAT SUGAR FREE 64)  60 mL Per Tube TID  . feeding supplement (VITAL 1.5 CAL)  1,000 mL Per Tube Q24H  . furosemide  80 mg Intravenous Q12H  .  hydrocortisone sod succinate (SOLU-CORTEF) inj  50 mg Intravenous Q6H  . insulin aspart  0-20 Units Subcutaneous Q4H  . insulin aspart  10 Units Subcutaneous Q4H  . insulin detemir  50 Units Subcutaneous BID  . ipratropium-albuterol  3 mL Nebulization Q6H  . mouth rinse  15 mL Mouth Rinse 10 times per day  . neomycin-bacitracin-polymyxin   Topical Daily  . oxyCODONE  10 mg Oral Q6H  . rosuvastatin  20 mg Per Tube QPM  . cholecalciferol  1,000 Units Per Tube Daily  . zinc sulfate  220 mg Per Tube Daily   Continuous Infusions: . sodium chloride Stopped (01/20/19 1052)  . ciprofloxacin Stopped (01/20/19 1119)  . dextrose Stopped (01/19/19 2111)  . dextrose 5 % and 0.45% NaCl 75 mL/hr at 01/20/19 1051  . fentaNYL infusion INTRAVENOUS 300 mcg/hr (01/20/19 1002)  . insulin 12 Units/hr (01/20/19 1330)  . meropenem (MERREM) IV Stopped (01/20/19 1015)  . midazolam 10 mg/hr (01/20/19 1231)  . norepinephrine  (LEVOPHED) Adult infusion Stopped (01/20/19 0739)  . phenylephrine (NEO-SYNEPHRINE) Adult infusion Stopped (01/18/19 1149)  . vancomycin 1,000 mg (01/20/19 1125)  . vasopressin (PITRESSIN) infusion - *FOR SHOCK* Stopped (01/18/19 1243)     LOS: 8 days   [employer]  [facility]  01/20/2019, 2:03 PM

## 2019-01-20 NOTE — Progress Notes (Signed)
Pt supined without complications.  Skin breakdown on face.  Mepilex and replicare applied.  ETT secured with commercial tube holder.

## 2019-01-20 NOTE — Progress Notes (Signed)
Pt's head turned to the left no complications. ETT is secure.

## 2019-01-20 NOTE — Progress Notes (Signed)
Pt's head turned to the right with no complications.  ETT secured

## 2019-01-20 NOTE — Progress Notes (Signed)
NAME:  Kaitlin [name], MRN:  [id number], DOB:  May 27, 1965, LOS: 85 ADMISSION DATE:  01/12/2019, CONSULTATION DATE:  1/4 REFERRING MD:  Billey Chang, CHIEF COMPLAINT:  Dyspnea   Brief History   10 female admitted 1/1 with COVID pneumonia, worsening hypoxemia and intubated on 1/4 requiring intubation.   Past Medical History  Obesity DM2 HTN Chronic diastolic heart failrue Sickle trait Recent thymoma  Significant Hospital Events   1/1: Admitted started on supplemental oxygen, remdesivir, and systemic steroids  1/2 received Actemra 1/3: Oxygen requirements improving continuing remdesivir and steroids inflammatory markers starting to improve 1/4: Oxygen requirements increasing, had been down to 6 to 8 L via nasal cannula, this required titration up to 15 L.  She was more anxious.  There was not significant change in her chest x-ray.  The patient became more confused in the early evening hours got out of bed without oxygen after this event more obtunded pulse oximetry 82 to 86% on high flow nasal cannula and nonrebreather she was unresponsive to sternal rub she was intubated at [place].  Post intubation ABG still hypercarbic, had significant hypoxia 1/5: Aggressive IV diuresis started, proning protocol with heavy sedation initiated, left IJ triple-lumen catheter placed, echo ordered. 1/6: Newly identified LV dysfunction by echocardiogram with EF 45 to 50%.  Still fairly hypoxic requiring high FiO2/PEEP ongoing supportive care.  Changing propofol to Versed given rising triglycerides.  Did become more hypotensive requiring escalation of vasoactive drips, and up titration of PEEP and FiO2 after.  being placed back in the supine position.  Remdesivir completed  1/7: Worsening shock requiring further titration of vasoactive drips, change from phenylephrine to norepinephrine and vasopressin, stress dose steroids started raising concern for possible sepsis, diuresis discontinued, antibiotics widened 1/8: Fever  curve improved, cultures all negative thus far, pressor requirements improved excision requirements improved Consults:  PCCM  Procedures:  ETT 1/4 >  L IJ CVL 1/5 >  Arterial R radial 1/6 >   Significant Diagnostic Tests:  Echo 1/6 Left Ventricle: Left ventricular ejection fraction, by visual estimation, is 45 to 50%. The left ventricle has mildly decreased function. The left ventricle is not well visualized. There is moderately increased left ventricular hypertrophy. Left ventricular diastolic parameters are indeterminate  Micro Data:  Coronavirus 2 antigen 1/1: Positive Blood cultures x2 1/1>>> 1/7 resp culture >  1/7 blood >   Antimicrobials:  1/1 remdesivir >  1/2 toci >  1/1 decadron > changed to stress dose steroids 1/7 1/4 vanc >  1/4 cefepime > 1/7 1/7 mero >  1/7 cipro >    Interim history/subjective:   No issues overnight Objective   Blood pressure 110/66, pulse (!) 103, temperature 98.8 F (37.1 C), temperature source Axillary, resp. rate (!) 24, height 5\' 9"  (1.753 m), weight (!) 160.9 kg, last menstrual period 06/17/2012, SpO2 95 %.    Vent Mode: PRVC FiO2 (%):  [50 %-60 %] 50 % Set Rate:  [24 bmp] 24 bmp Vt Set:  [400 mL] 400 mL PEEP:  [12 cmH20] 12 cmH20 Plateau Pressure:  [24 cmH20-28 cmH20] 25 cmH20   Intake/Output Summary (Last 24 hours) at 01/20/2019 1151 Last data filed at 01/20/2019 0600 Gross per 24 hour  Intake 3673.99 ml  Output 2495 ml  Net 1178.99 ml   Filed Weights   01/13/19 0410 01/13/19 0413 01/20/19 0500  Weight: (!) 178.2 kg (!) 178.2 kg (!) 160.9 kg    Examination: General obese 54 year old female currently in the prone position on heavy  sedation HEENT orally intubated Pulmonary equal chest rise remarkably diminished bilaterally Ventilator settings: [place] pressure25 Impression 13 Cardiac regular rate and rhythm Abdomen soft obese nontender Extremities warm and dry brisk cap refill GU clear yellow Neuro  heavily sedated, requiring as needed neuromuscular blockade     Resolved Hospital Problem list     Assessment & Plan:  Septic shock 1/7; may be a cardiogenic component  cultures still pending, etiology not clear, white blood cell count coming down Plan Day #6 vancomycin, I think we can discontinue this as she has had no staph growth thus far, and MRSA PCR was negative Day #2 meropenem and Cipro CVP goal 8-10 Mean arterial pressure goal greater than 65 Follow-up pending culture data Check SVO 2  & BNP, Will consider adding low-dose dobutamine  Acute hypoxic respiratory failure with ARDS due to COVID, further complicated by element of pulmonary edema and new systemic cardiomyopathy superimposed on underlying obesity hypoventilation syndrome and sleep apnea Supine arterial blood gas reviewed acceptable Plan  Continue low tidal volume ventilation at 6 mL/kg predicted body weight Plateau pressure goal less than 30, driving pressure goal less than 15 Continuing prone protocol as long as /F ratio less than 150 Reassess daily for diuresis VAP bundle PAD protocol, RASS goal -4 Continue sedating drips currently on**fentanyl and Versed, clonazepam and oxycodone added on 1/7 F/u LE dopplers  Systolic cardiomyopathy, newly reduced EF down to 45 to [id number], grade 1 diastolic heart failure Plan Continue telemetry monitoring   F/E/N: -continue TF; ask nutrition to evaluate protein and adjust -monitor and replace electrolytes as needed (esp K and Mg in the setting of lasix diuresis)  Mild AKI Plan MAP goal >65 Renal adjust medications A.m. chemistry Strict intake output  DM2 with hyperglycemia Plan -per primary service; started to adjust levemir this a.m. but spoke with primary team and new orders placed by them.  Tube feeding: Plan Continue tube feeds    Best practice:  Diet: TF;  Pain/Anxiety/Delirium protocol (if indicated): as above VAP protocol (if indicated): yes DVT  prophylaxi: lovenox 0.5mg /kg per protocol GI prophylaxis: pepcid for stress ulcer prophylaxis Glucose control: SSI Mobility: bed rest Code Status: full Family Communication: per TRH Disposition: remain in ICU   ADDENDUM: I have independently seen and examined the patient, reviewed data, and developed an assessment and plan. A total of 38 minutes were spent in critical care assessment and medical decision making. This critical care time does not reflect procedure time, or teaching time or supervisory time of PA/NP/Med student/Med Resident, etc but could involve care discussion time.  Bonna Gains, MD PhD 01/20/19 11:56 AM

## 2019-01-20 NOTE — Progress Notes (Signed)
E-link notified for consecutive CBGs >400 mg/dL. Continuous insulin infusion restarted per endotool recommendations.

## 2019-01-20 NOTE — Progress Notes (Signed)
Pt prone with no complications, head to the right. ETT secured at 22 at the lips with cloth tape.

## 2019-01-21 LAB — COMPREHENSIVE METABOLIC PANEL
ALT: 69 U/L — ABNORMAL HIGH (ref 0–44)
AST: 48 U/L — ABNORMAL HIGH (ref 15–41)
Albumin: 2.7 g/dL — ABNORMAL LOW (ref 3.5–5.0)
Alkaline Phosphatase: 69 U/L (ref 38–126)
Anion gap: 12 (ref 5–15)
BUN: 53 mg/dL — ABNORMAL HIGH (ref 6–20)
CO2: 32 mmol/L (ref 22–32)
Calcium: 8.3 mg/dL — ABNORMAL LOW (ref 8.9–10.3)
Chloride: 101 mmol/L (ref 98–111)
Creatinine, Ser: 0.85 mg/dL (ref 0.44–1.00)
GFR calc Af Amer: >60 mL/min (ref >60)
GFR calc non Af Amer: >60 mL/min (ref >60)
Glucose, Bld: 447 mg/dL — ABNORMAL HIGH (ref 70–99)
Potassium: 4.3 mmol/L (ref 3.5–5.1)
Sodium: 145 mmol/L (ref 135–145)
Total Bilirubin: 0.9 mg/dL (ref 0.3–1.2)
Total Protein: 5.6 g/dL — ABNORMAL LOW (ref 6.5–8.1)

## 2019-01-21 LAB — GLUCOSE, CAPILLARY
Glucose-Capillary: 145 mg/dL — ABNORMAL HIGH (ref 70–99)
Glucose-Capillary: 151 mg/dL — ABNORMAL HIGH (ref 70–99)
Glucose-Capillary: 152 mg/dL — ABNORMAL HIGH (ref 70–99)
Glucose-Capillary: 158 mg/dL — ABNORMAL HIGH (ref 70–99)
Glucose-Capillary: 161 mg/dL — ABNORMAL HIGH (ref 70–99)
Glucose-Capillary: 161 mg/dL — ABNORMAL HIGH (ref 70–99)
Glucose-Capillary: 174 mg/dL — ABNORMAL HIGH (ref 70–99)
Glucose-Capillary: 180 mg/dL — ABNORMAL HIGH (ref 70–99)
Glucose-Capillary: 194 mg/dL — ABNORMAL HIGH (ref 70–99)
Glucose-Capillary: 200 mg/dL — ABNORMAL HIGH (ref 70–99)
Glucose-Capillary: 236 mg/dL — ABNORMAL HIGH (ref 70–99)
Glucose-Capillary: 239 mg/dL — ABNORMAL HIGH (ref 70–99)
Glucose-Capillary: 247 mg/dL — ABNORMAL HIGH (ref 70–99)
Glucose-Capillary: 295 mg/dL — ABNORMAL HIGH (ref 70–99)
Glucose-Capillary: 326 mg/dL — ABNORMAL HIGH (ref 70–99)
Glucose-Capillary: 339 mg/dL — ABNORMAL HIGH (ref 70–99)
Glucose-Capillary: 378 mg/dL — ABNORMAL HIGH (ref 70–99)
Glucose-Capillary: 420 mg/dL — ABNORMAL HIGH (ref 70–99)

## 2019-01-21 LAB — POCT I-STAT 7, (LYTES, BLD GAS, ICA,H+H)
Acid-Base Excess: 14 mmol/L — ABNORMAL HIGH (ref 0.0–2.0)
Bicarbonate: 40.6 mmol/L — ABNORMAL HIGH (ref 20.0–28.0)
Calcium, Ion: 1.28 mmol/L (ref 1.15–1.40)
HCT: 38 % (ref 36.0–46.0)
Hemoglobin: 12.9 g/dL (ref 12.0–15.0)
O2 Saturation: 100 %
Potassium: 4.1 mmol/L (ref 3.5–5.1)
Sodium: 151 mmol/L — ABNORMAL HIGH (ref 135–145)
TCO2: 42 mmol/L — ABNORMAL HIGH (ref 22–32)
pCO2 arterial: 59.2 mmHg — ABNORMAL HIGH (ref 32.0–48.0)
pH, Arterial: 7.444 (ref 7.350–7.450)
pO2, Arterial: 263 mmHg — ABNORMAL HIGH (ref 83.0–108.0)

## 2019-01-21 LAB — CBC
HCT: 38.8 % (ref 36.0–46.0)
Hemoglobin: 11.9 g/dL — ABNORMAL LOW (ref 12.0–15.0)
MCH: 24.5 pg — ABNORMAL LOW (ref 26.0–34.0)
MCHC: 30.7 g/dL (ref 30.0–36.0)
MCV: 80 fL (ref 80.0–100.0)
Platelets: 317 10*3/uL (ref 150–400)
RBC: 4.85 MIL/uL (ref 3.87–5.11)
RDW: 17.9 % — ABNORMAL HIGH (ref 11.5–15.5)
WBC: 12.1 10*3/uL — ABNORMAL HIGH (ref 4.0–10.5)
nRBC: 0.2 % (ref 0.0–0.2)

## 2019-01-21 MED ORDER — SODIUM CHLORIDE 0.9 % IV SOLN: INTRAVENOUS | Status: DC

## 2019-01-21 MED ORDER — LACTATED RINGERS IV SOLN: INTRAVENOUS | Status: DC

## 2019-01-21 MED ORDER — INSULIN REGULAR(HUMAN) IN NACL 100-0.9 UT/100ML-% IV SOLN
INTRAVENOUS | Status: DC
  Administered 2019-01-21: 11.5 [IU]/h via INTRAVENOUS
  Administered 2019-01-21: 08:00:00 18 [IU]/h via INTRAVENOUS
  Administered 2019-01-21: 10 [IU]/h via INTRAVENOUS
  Administered 2019-01-21: 13 [IU]/h via INTRAVENOUS
  Administered 2019-01-22: 17:00:00 7 [IU]/h via INTRAVENOUS
  Administered 2019-01-22: 09:00:00 10 [IU]/h via INTRAVENOUS
  Administered 2019-01-23: 6.5 [IU]/h via INTRAVENOUS
  Filled 2019-01-21 (×5): qty 100

## 2019-01-21 MED ORDER — DEXTROSE-NACL 5-0.45 % IV SOLN: INTRAVENOUS | Status: DC

## 2019-01-21 MED ORDER — DEXTROSE 50 % IV SOLN: 0.0000 mL | INTRAVENOUS | Status: DC | PRN

## 2019-01-21 NOTE — Progress Notes (Signed)
[facility] Progress Note Patient Name: [name] DOB: 1965-07-10 MRN: [id number]   Date of Service  01/21/2019  HPI/Events of Note  Hyperglycemia. Patient had been transitioned off insulin drip to [place] insulin per EndoTool CDT but blood glucose rose to >400 mg/dL again on [place] insulin regimen recommended.  She is receiving stress dose steroids which is increasing her requirements.   eICU Interventions  Transition back to insulin drip for now.  I have changed her indication from T1DM to T2DM in the EndoTool orderset (since I see only reference to her being a type 2 diabetic) which may result in the tool producing a more appropriate insulin [place] regimen moving forward.      Intervention Category Intermediate Interventions: Hyperglycemia - evaluation and treatment  Charlott Rakes 01/21/2019, 3:16 AM

## 2019-01-21 NOTE — Progress Notes (Signed)
[facility] PROGRESS NOTE    Progress Note  Kaitlin [name]  POE:[id number] DOB: 1965-03-08 DOA: 01/12/2019 PCP: Olga Coaster, [facility]     Brief Narrative:   Kaitlin [name] is an 54 y.o. 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  In the ED she was found to be hypoxic with a chest x-ray with bilateral infiltrates.  Assessment/Plan:   Acute respiratory failure with hypoxia secondarily to Pneumonia due to COVID-19 virus:  Recent Labs  Lab 01/15/19 0135 01/15/19 0343 01/16/19 0545 01/17/19 0500 01/18/19 0120 01/19/19 0452 01/20/19 0500 01/21/19 0500  DDIMER 1.60*  --  2.52* 3.66* 7.03*  --   --   --   CRP 8.2*  --  4.2* 3.8* 2.0*  --   --   --   ALT 38  --  33 31 53* 90* 88* 69*  PROCALCITON  --  <0.10 <0.10 <0.10  --   --   --   --    She was placed on remdesivir and steroids.  She also received 1 dose of Actemra.  Patient continued to have high oxygen requirements and then she acutely decompensated.  She was transferred to the ICU and was intubated.   Patient was started on broad-spectrum antibiotics due to concern for secondary bacterial infection.  She had high WBC.  She was febrile.  Blood pressure also was low.    Pulmonology is following.  Patient remains intubated and sedated.  She appears to have stabilized in the last 2 to 3 days.  Oxygen requirements had improved but she is noted to be on higher FiO2 today.  Blood pressure had also improved.  Does not appear like she is ready to be weaned yet.  Acute on chronic systolic and diastolic congestive heart failure Echocardiogram shows EF of 45%. Patient with known diastolic dysfunction.  Patient remains on scheduled furosemide.  Continue ins and outs.   Daily weights.  No evidence for cardiogenic shock as her SvO2 was 77.  Septic shock She has been off of pressors.  She remains on stress dose steroids.  The dose was decreased yesterday.  Carvedilol was discontinued.  Continue to taper down on her hydrocortisone.  Insulin-dependent diabetes mellitus type 2: Patient on insulin pump at home.  Patient has been on IV insulin here.  She has been transitioned twice to subcutaneous insulin however this has not optimally controlled her glucose levels.  She likely needs a significantly higher dose of basal insulin.  Diabetes coordinator is seen.  They recommend not to try and transition until she is requiring less than 8 units/h.  She is getting IV insulin through new approach called Endotool. HbA1c 7.1.     HLD (hyperlipidemia): Continue statins.  Morbid obesity Estimated body mass index is 57.45 kg/m as calculated from the following:   Height as of this encounter: '5\' 9"'  (1.753 m).   Weight as of this encounter: 176.4 kg.  Nutrition On tube feedings.  Transaminitis Secondary to COVID-19.   DVT prophylaxis: On Lovenox 85 mg every 12 hours CODE STATUS: Full code Family Communication: PCCM has been updating family. Disposition Plan: Remain in ICU for now.  Disposition will depend on her clinical course in the hospital.    IV Access:    Peripheral IV  Procedures and diagnostic studies:   Transthoracic echocardiogram 01/16/2019  1. Left ventricular ejection fraction, by visual estimation, is 45 to 50%. The left ventricle has mildly decreased function. There is moderately increased left ventricular hypertrophy.  2. Left ventricular diastolic parameters are indeterminate.  3. Global right ventricle was not well visualized.The right ventricular size is not well visualized. Right vetricular wall thickness was not assessed.  4. Left atrial size was normal.  5. The mitral valve is normal in structure. Trivial mitral valve regurgitation. No  evidence of mitral stenosis.  6. The tricuspid valve is not well visualized.  7. The aortic valve was not well visualized. Aortic valve regurgitation is not visualized. No evidence of aortic valve sclerosis or stenosis.  8. The pulmonic valve was not well visualized. Pulmonic valve regurgitation is trivial.   Radiology Reports DG CHEST PORT 1 VIEW  Result Date: 01/20/2019 CLINICAL DATA:  Hypoxia with respiratory failure EXAM: PORTABLE CHEST 1 VIEW COMPARISON:  January 18, 2009 FINDINGS: Patient is rotated. There has been significant clearing of consolidation from the lungs bilaterally. There remains atelectatic change in the right base. The lungs elsewhere appear clear. Heart is upper normal in size with pulmonary vascularity normal. Feeding tube tip is below the diaphragm. Patient is status post median sternotomy. No overt adenopathy. IMPRESSION: Significant clearing bilaterally. Atelectasis right base. Lungs elsewhere clear. Stable cardiac silhouette. Electronically Signed   By: Lowella Grip III M.D.   On: 01/20/2019 12:03   LE Venous  Result Date: 01/21/2019  Lower Venous Study Indications: COVID positive, elevated d-dimer.  Limitations: 392lb, body habitus and poor ultrasound/tissue interface. Comparison Study: 08/30/2017- negative lower extremity venous duplex. Performing Technologist: Maudry Mayhew MHA, RDMS, RVT, RDCS  Examination Guidelines: A complete evaluation includes B-mode imaging, spectral Doppler, color Doppler, and power Doppler as needed of all accessible portions of each vessel. Bilateral testing is considered an integral part of a complete examination. Limited examinations for reoccurring indications may be performed as noted.  +---------+---------------+---------+-----------+----------+--------------+ RIGHT    CompressibilityPhasicitySpontaneityPropertiesThrombus Aging +---------+---------------+---------+-----------+----------+--------------+ CFV      Full                     Yes                                 +---------+---------------+---------+-----------+----------+--------------+ SFJ                                                   Not visualized +---------+---------------+---------+-----------+----------+--------------+ FV Prox  Full                                                        +---------+---------------+---------+-----------+----------+--------------+ FV Mid                                                Not visualized +---------+---------------+---------+-----------+----------+--------------+ FV Distal  Not visualized +---------+---------------+---------+-----------+----------+--------------+ PFV                                                   Not visualized +---------+---------------+---------+-----------+----------+--------------+ POP      Full           Yes      Yes                                 +---------+---------------+---------+-----------+----------+--------------+ PTV      Full                    Yes                                 +---------+---------------+---------+-----------+----------+--------------+ Limited visualization of calf veins.  +---------+---------------+---------+-----------+----------+--------------+ LEFT     CompressibilityPhasicitySpontaneityPropertiesThrombus Aging +---------+---------------+---------+-----------+----------+--------------+ CFV      Full           Yes      Yes                                 +---------+---------------+---------+-----------+----------+--------------+ FV Prox  Full                                                        +---------+---------------+---------+-----------+----------+--------------+ FV Mid   Full                                                        +---------+---------------+---------+-----------+----------+--------------+ FV Distal                                              Not visualized +---------+---------------+---------+-----------+----------+--------------+ PFV                                                   Not visualized +---------+---------------+---------+-----------+----------+--------------+ POP      Full           Yes      Yes                                 +---------+---------------+---------+-----------+----------+--------------+ PTV                                                   Not visualized +---------+---------------+---------+-----------+----------+--------------+ PERO  Not visualized +---------+---------------+---------+-----------+----------+--------------+ Limited visualization of calf veins.    Summary: Right: There is no evidence of deep vein thrombosis in the lower extremity. However, portions of this examination were limited- see technologist comments above. No cystic structure found in the popliteal fossa. Left: There is no evidence of deep vein thrombosis in the lower extremity. However, portions of this examination were limited- see technologist comments above. No cystic structure found in the popliteal fossa.  *See table(s) above for measurements and observations. Electronically signed by Harold Barban MD on 01/21/2019 at 10:43:31 AM.    Final      Medical Consultants:    Pulmonology.  Anti-Infectives:   Anti-infectives (From admission, onward)   Start     Dose/Rate Route Frequency Ordered Stop   01/18/19 2300  vancomycin (VANCOCIN) IVPB 1000 mg/200 mL premix  Status:  Discontinued     1,000 mg 200 mL/hr over 60 Minutes Intravenous Every 12 hours 01/18/19 1631 01/21/19 1047   01/18/19 1000  meropenem (MERREM) 1 g in sodium chloride 0.9 % 100 mL IVPB  Status:  Discontinued     1 g 200 mL/hr over 30 Minutes Intravenous Every 8 hours 01/18/19 0903 01/21/19 1047   01/18/19 1000  ciprofloxacin (CIPRO) IVPB 400 mg  Status:  Discontinued       400 mg 200 mL/hr over 60 Minutes Intravenous Every 12 hours 01/18/19 0903 01/21/19 1047   01/16/19 1100  vancomycin (VANCOREADY) IVPB 1500 mg/300 mL  Status:  Discontinued     1,500 mg 150 mL/hr over 120 Minutes Intravenous Every 12 hours 01/15/19 1633 01/18/19 1631   01/16/19 0000  vancomycin (VANCOCIN) IVPB 1000 mg/200 mL premix     1,000 mg 200 mL/hr over 60 Minutes Intravenous  Once 01/15/19 2318 01/16/19 1436   01/15/19 1700  ceFEPIme (MAXIPIME) 2 g in sodium chloride 0.9 % 100 mL IVPB  Status:  Discontinued     2 g 200 mL/hr over 30 Minutes Intravenous Every 8 hours 01/15/19 1634 01/18/19 0902   01/15/19 1645  vancomycin (VANCOCIN) 2,500 mg in sodium chloride 0.9 % 500 mL IVPB  Status:  Discontinued     2,500 mg 250 mL/hr over 120 Minutes Intravenous  Once 01/15/19 1633 01/16/19 1435   01/14/19 1000  remdesivir 100 mg in sodium chloride 0.9 % 100 mL IVPB     100 mg 200 mL/hr over 30 Minutes Intravenous Daily 01/12/19 2341 01/17/19 1100   01/13/19 0300  remdesivir 200 mg in sodium chloride 0.9% 250 mL IVPB     200 mg 580 mL/hr over 30 Minutes Intravenous Once 01/12/19 2341 01/13/19 0715       Subjective:    Intubated and sedated.  In prone position.   Objective:    Vitals:   01/21/19 0715 01/21/19 0730 01/21/19 0800 01/21/19 0900  BP: (!) '92/57 95/71 92/60 ' 103/60  Pulse: 73 75 75 75  Resp: (!) 24 (!) 24 (!) 24 (!) 24  Temp:   (!) 96.1 F (35.6 C)   TempSrc:   Axillary   SpO2: 100% 100% 100% 99%  Weight:      Height:       SpO2: 99 % O2 Flow Rate (L/min): 15 L/min FiO2 (%): 80 %   Intake/Output Summary (Last 24 hours) at 01/21/2019 1418 Last data filed at 01/21/2019 0600 Gross per 24 hour  Intake 3615.28 ml  Output 2380 ml  Net 1235.28 ml   Filed Weights   01/13/19 0413 01/20/19 0500 01/21/19 0430  Weight: Marland Kitchen)  178.2 kg (!) 160.9 kg (!) 176.4 kg    General appearance: Intubated and sedated Resp: Crackles at the bases bilaterally.  No wheezing or  rhonchi.   Cardio: Unable to auscultate due to prone positioning GI: Unable to examine due to prone positioning Extremities: Improving edema bilateral lower extremities Neurologic: Intubated and sedated    Data Reviewed:    Labs: Basic Metabolic Panel: Recent Labs  Lab 01/16/19 0718 01/16/19 2040 01/17/19 0500 01/17/19 1733 01/18/19 0120 01/19/19 0452 01/19/19 1355 01/19/19 1743 01/20/19 0500 01/20/19 1934 01/21/19 0500  NA  --   --  143  --  144 147* 148* 148* 146* 151* 145  K  --   --  3.8  --  4.4 3.8 4.1 3.9 3.8 4.2 4.3  CL  --   --  100  --  103 108  --   --  105  --  101  CO2  --   --  32  --  29 29  --   --  32  --  32  GLUCOSE  --   --  120*  --  256* 267*  --   --  345*  --  447*  BUN  --   --  27*  --  38* 45*  --   --  47*  --  53*  CREATININE  --   --  0.89  --  1.03* 1.10*  --   --  1.08*  --  0.85  CALCIUM  --   --  8.5*  --  8.7* 8.6*  --   --  8.5*  --  8.3*  MG 2.1 2.2 2.2 2.1  --   --   --   --   --   --   --   PHOS 3.5 3.9 4.1 4.4  --   --   --   --   --   --   --    GFR Estimated Creatinine Clearance: 136.3 mL/min (by C-G formula based on SCr of 0.85 mg/dL).  Liver Function Tests: Recent Labs  Lab 01/17/19 0500 01/18/19 0120 01/19/19 0452 01/20/19 0500 01/21/19 0500  AST 42* 70* 113* 80* 48*  ALT 31 53* 90* 88* 69*  ALKPHOS 81 86 79 67 69  BILITOT 0.6 0.7 0.5 0.6 0.9  PROT 6.2* 6.6 5.8* 5.5* 5.6*  ALBUMIN 2.8* 2.9* 2.6* 2.6* 2.7*   COVID-19 Labs  No results for input(s): DDIMER, FERRITIN, LDH, CRP in the last 72 hours.  Lab Results  Component Value Date   [place] NEGATIVE 11/19/2018    CBC: Recent Labs  Lab 01/15/19 0135 01/16/19 0545 01/17/19 0500 01/18/19 0120 01/19/19 0452 01/19/19 1355 01/19/19 1743 01/20/19 0500 01/20/19 1934 01/21/19 0500  WBC 7.5 7.5 10.8* 15.7* 14.1*  --   --  14.9*  --  12.1*  NEUTROABS 4.8 5.1 7.2 12.8* 10.7*  --   --   --   --   --   HGB 13.6 13.8 13.5 14.8 14.1 13.6 13.3 12.6 12.6 11.9*   HCT 41.6 41.8 41.6 47.1* 43.9 40.0 39.0 40.1 37.0 38.8  MCV 77.0* 76.0* 76.6* 77.9* 79.4*  --   --  80.7  --  80.0  PLT 305 363 472* 581* 444*  --   --  386  --  317   CBG: Recent Labs  Lab 01/21/19 0733 01/21/19 0938 01/21/19 1038 01/21/19 1152 01/21/19 1311  GLUCAP 295* 236* 247* 239* 200*   D-Dimer: No  results for input(s): DDIMER in the last 72 hours. Lipid Profile: Recent Labs    01/19/19 0452  TRIG 257*   Sepsis Labs: Recent Labs  Lab 01/15/19 0343 01/16/19 0545 01/17/19 0500 01/18/19 0120 01/19/19 0452 01/20/19 0500 01/21/19 0500  PROCALCITON <0.10 <0.10 <0.10  --   --   --   --   WBC  --  7.5 10.8* 15.7* 14.1* 14.9* 12.1*   Microbiology Recent Results (from the past 240 hour(s))  Blood Culture (routine x 2)     Status: None   Collection Time: 01/12/19  6:38 PM   Specimen: BLOOD  Result Value Ref Range Status   Specimen Description   Final    BLOOD RIGHT ANTECUBITAL Performed at [facility], [address] [place], [place] [address]    Special Requests   Final    BOTTLES DRAWN AEROBIC AND ANAEROBIC Blood Culture adequate volume Performed at [facility], [address] [place], [place] [address]    Culture   Final    NO GROWTH 5 DAYS Performed at [facility], [place] [address], [place], [place] [address]    Report Status 01/17/2019 FINAL  Final  SARS Coronavirus 2 Ag (30 min TAT) - Nasal Swab (BD Veritor Kit)     Status: Abnormal   Collection Time: 01/12/19  6:38 PM   Specimen: Nasal Swab (BD Veritor Kit)  Result Value Ref Range Status   SARS Coronavirus 2 Ag POSITIVE (A) NEGATIVE Final    Comment: RESULT CALLED TO, READ BACK BY AND VERIFIED WITH: Marsa Aris RN AT 1942 ON 01/12/19 BY [email or web address] (NOTE) SARS-CoV-2 antigen PRESENT. Positive results indicate the presence of viral antigens, but clinical correlation with patient history and other diagnostic information is necessary to determine patient infection status.    Positive results do not rule out bacterial infection or co-infection  with other viruses. False positive results are rare but can occur, and confirmatory RT-PCR testing may be appropriate in some circumstances. The expected result is Negative. Fact Sheet for Patients: PodPark.tn Fact Sheet for Providers: GiftContent.is  This test is not yet approved or cleared by the [place] FDA and  has been authorized for detection and/or diagnosis of SARS-CoV-2 by FDA under an Emergency Use Authorization (EUA).  This EUA will remain in effect (meaning this test can be used) for the duration of  the COVID-19  declaration under Section 564(b)(1) of the Act, 21 U.S.C. section 360bbb-3(b)(1), unless the authorization is terminated or revoked sooner. Performed at [facility], [address] [place], [place] [address]   Blood Culture (routine x 2)     Status: None   Collection Time: 01/12/19  7:36 PM   Specimen: BLOOD  Result Value Ref Range Status   Specimen Description   Final    BLOOD LEFT ANTECUBITAL Performed at [facility], [address] [place], [place] [address]    Special Requests   Final    BOTTLES DRAWN AEROBIC AND ANAEROBIC Blood Culture adequate volume Performed at [facility], [address] [place], [place] [address]    Culture   Final    NO GROWTH 5 DAYS Performed at [facility], [place] [address] [place], [place] [address]    Report Status 01/17/2019 FINAL  Final  MRSA PCR Screening     Status: None   Collection Time: 01/18/19 10:00 AM   Specimen: Nasopharyngeal  Result Value  Ref Range Status   MRSA by PCR NEGATIVE NEGATIVE Final    Comment:        The GeneXpert MRSA Assay (FDA approved for NASAL specimens only), is one component of a comprehensive MRSA colonization surveillance program. It is not intended to diagnose MRSA infection nor to guide or monitor  treatment for MRSA infections. Performed at [facility], [place] [address] [place], [place] [address]   Culture, blood (routine x 2)     Status: None (Preliminary result)   Collection Time: 01/18/19 11:58 AM   Specimen: BLOOD  Result Value Ref Range Status   Specimen Description   Final    BLOOD LEFT ARM Performed at [place] [address], [place], [place] [address]    Special Requests   Final    BOTTLES DRAWN AEROBIC AND ANAEROBIC Blood Culture adequate volume Performed at [place] [address], [place], [place] [address]    Culture   Final    NO GROWTH 3 DAYS Performed at [facility], [place] [address], [place], [place] [address]    Report Status PENDING  Incomplete  Culture, blood (routine x 2)     Status: None (Preliminary result)   Collection Time: 01/18/19 12:05 PM   Specimen: BLOOD  Result Value Ref Range Status   Specimen Description   Final    BLOOD LEFT ARM Performed at [place] [address], [place], [place] [address]    Special Requests   Final    BOTTLES DRAWN AEROBIC AND ANAEROBIC Blood Culture adequate volume Performed at [place] [address] [place], [place] [address]    Culture   Final    NO GROWTH 3 DAYS Performed at [facility], [place] [address], [place], [place] [address]    Report Status PENDING  Incomplete  Culture, respiratory (non-expectorated)     Status: None   Collection Time: 01/18/19  3:25 PM   Specimen: Tracheal Aspirate; Respiratory  Result Value Ref Range Status   Specimen Description   Final    TRACHEAL ASPIRATE Performed at [place] [address], [place], [place] [address]    Special Requests   Final    NONE Performed at [facility], [place] [address] [place], [place] [address]    Gram Stain   Final    FEW WBC PRESENT, PREDOMINANTLY PMN NO ORGANISMS SEEN     Culture   Final    NO GROWTH 2 DAYS Performed at [place] [address], [place], [place] [address]    Report Status 01/20/2019 FINAL  Final     Medications:   . artificial tears  1 application Both Eyes L0B  . vitamin C  500 mg Per Tube Daily  . aspirin  81 mg Per Tube Daily  . chlorhexidine  15 mL Mouth/Throat BID  . Chlorhexidine Gluconate Cloth  6 each Topical Daily  . clonazePAM  2 mg Oral BID  . enoxaparin (LOVENOX) injection  85 mg Subcutaneous Q12H  . famotidine  20 mg Per Tube BID  . feeding supplement (PRO-STAT SUGAR FREE 64)  60 mL Per Tube TID  . feeding supplement (VITAL 1.5 CAL)  1,000 mL Per Tube Q24H  . furosemide  80 mg Intravenous Q12H  . hydrocortisone sod succinate (SOLU-CORTEF) inj  50 mg Intravenous Q8H  . ipratropium-albuterol  3 mL Nebulization Q6H  . mouth rinse  15 mL Mouth Rinse 10 times per day  . neomycin-bacitracin-polymyxin   Topical Daily  . oxyCODONE  10 mg Oral Q6H  . rosuvastatin  20 mg Per Tube QPM  . cholecalciferol  1,000 Units Per Tube Daily  . zinc sulfate  220 mg Per Tube Daily   Continuous Infusions: . dextrose Stopped (01/19/19 2111)  . fentaNYL infusion INTRAVENOUS 300 mcg/hr (01/21/19 1213)  . insulin 18 Units/hr (01/21/19 1415)  . lactated ringers 20 mL/hr at 01/21/19 1201  . midazolam 10 mg/hr (01/21/19 1332)  . norepinephrine (LEVOPHED) Adult infusion 0.533 mcg/min (01/21/19 0400)  . phenylephrine (NEO-SYNEPHRINE) Adult infusion Stopped (01/18/19 1149)  . vasopressin (PITRESSIN) infusion - *FOR SHOCK* Stopped (01/18/19 1243)     LOS: 9 days   [employer]  [facility]  01/21/2019, 2:18 PM

## 2019-01-21 NOTE — Progress Notes (Signed)
Pt prone at this time with no complications.  Foam pads placed on cheeks, forehead, and upper lip.  ETT secured at 22cm at the lips with cloth tape.  TRx2 and RNx 3.

## 2019-01-21 NOTE — Progress Notes (Addendum)
Inpatient Diabetes Program Recommendations  AACE/ADA: New Consensus Statement on Inpatient Glycemic Control (2015)  Target Ranges:  Prepandial:   less than 140 mg/dL      Peak postprandial:   less than 180 mg/dL (1-2 hours)      Critically ill patients:  140 - 180 mg/dL   Lab Results  Component Value Date   GLUCAP 295 (H) 01/21/2019   HGBA1C 7.1 (H) 01/13/2019    Review of Glycemic Control Results for Kaitlin, [name] (MRN [id number]) as of 01/21/2019 09:08  Ref. Range 01/20/2019 23:53 01/21/2019 04:54 01/21/2019 05:27 01/21/2019 06:27 01/21/2019 07:33  Glucose-Capillary Latest Ref Range: 70 - 99 mg/dL 326 (H) 420 (H) 378 (H) 339 (H) 295 (H)   Diabetes history: DM 2 Outpatient Diabetes medications:  Insulin pump-Medtronic insulin pump Average total insulin daily 152 units/day (basal insulin=78 units daily)-per note from Dr. Tamala Julian Endocrinology in 08/2018 12 am - 2.5 units/hr, 8 am- 3.5 units/hr, 12 pm- 3.5 units/hr, 4 pm- 3.75 units/hr, 10 pm- 3.75 units/hr. Her insulin to carb ratio is now 1:5 and her sensitivity is 1:25. Ozempic 0.25 weekly Current orders for Inpatient glycemic control:  IV insulin  Inpatient Diabetes Program Recommendations:    Per review, patient has been requiring IV insulin doses >12 units/hr.  Would not recommend transition off insulin drip until IV insulin doses <8 units/hr.  Also consider using COVID-19 order set for transition.  Will follow.   Thanks  Adah Perl, RN, BC-ADM Inpatient Diabetes Coordinator Pager [phone] (8a-5p)

## 2019-01-21 NOTE — Progress Notes (Signed)
NAME:  Kaitlin [name], MRN:  [id number], DOB:  [birth year], LOS: 34 ADMISSION DATE:  01/12/2019, CONSULTATION DATE:  1/4 REFERRING MD:  Billey Chang, CHIEF COMPLAINT:  Dyspnea   Brief History   54 female admitted 1/1 with COVID pneumonia, worsening hypoxemia and intubated on 1/4 requiring intubation.   Past Medical History  Obesity DM2 HTN Chronic diastolic heart failrue Sickle trait Recent thymoma  Significant Hospital Events   1/1: Admitted started on supplemental oxygen, remdesivir, and systemic steroids  1/2 received Actemra 1/3: Oxygen requirements improving continuing remdesivir and steroids inflammatory markers starting to improve 1/4: Oxygen requirements increasing, had been down to 6 to 8 L via nasal cannula, this required titration up to 15 L.  She was more anxious.  There was not significant change in her chest x-ray.  The patient became more confused in the early evening hours got out of bed without oxygen after this event more obtunded pulse oximetry 82 to 86% on high flow nasal cannula and nonrebreather she was unresponsive to sternal rub she was intubated at [place].  Post intubation ABG still hypercarbic, had significant hypoxia 1/5: Aggressive IV diuresis started, proning protocol with heavy sedation initiated, left IJ triple-lumen catheter placed, echo ordered. 1/6: Newly identified LV dysfunction by echocardiogram with EF 45 to 50%.  Still fairly hypoxic requiring high FiO2/PEEP ongoing supportive care.  Changing propofol to Versed given rising triglycerides.  Did become more hypotensive requiring escalation of vasoactive drips, and up titration of PEEP and FiO2 after.  being placed back in the supine position.  Remdesivir completed  1/7: Worsening shock requiring further titration of vasoactive drips, change from phenylephrine to norepinephrine and vasopressin, stress dose steroids started raising concern for possible sepsis, diuresis discontinued, antibiotics widened 1/8: Fever  curve improved, cultures all negative thus far, pressor requirements improved excision requirements improved Consults:  PCCM  Procedures:  ETT 1/4 >  L IJ CVL 1/5 >  Arterial R radial 1/6 >   Significant Diagnostic Tests:  Echo 1/6 Left Ventricle: Left ventricular ejection fraction, by visual estimation, is 45 to 50%. The left ventricle has mildly decreased function. The left ventricle is not well visualized. There is moderately increased left ventricular hypertrophy. Left ventricular diastolic parameters are indeterminate  Micro Data:  Coronavirus 2 antigen 1/1: Positive Blood cultures x2 1/1>>> 1/7 resp culture >  1/7 blood >   Antimicrobials:  1/1 remdesivir >  1/2 toci >  1/1 decadron > changed to stress dose steroids 1/7 1/4 vanc >  1/4 cefepime > 1/7 1/7 mero >  1/7 cipro >    Interim history/subjective:   No acute events o/n. Afebrile >24 hours. Still requiring low dose pressor.   Objective   Blood pressure 103/60, pulse 75, temperature (!) 96.1 F (35.6 C), temperature source Axillary, resp. rate (!) 24, height 5\' 9"  (1.753 m), weight (!) 176.4 kg, last menstrual period 06/17/2012, SpO2 99 %.    Vent Mode: PRVC FiO2 (%):  [50 %-100 %] 80 % Set Rate:  [24 bmp] 24 bmp Vt Set:  [400 mL] 400 mL PEEP:  [12 cmH20] 12 cmH20 Plateau Pressure:  [24 cmH20-28 cmH20] 24 cmH20   Intake/Output Summary (Last 24 hours) at 01/21/2019 1134 Last data filed at 01/21/2019 0600 Gross per 24 hour  Intake 3615.28 ml  Output 2540 ml  Net 1075.28 ml   Filed Weights   01/13/19 0413 01/20/19 0500 01/21/19 0430  Weight: (!) 178.2 kg (!) 160.9 kg (!) 176.4 kg    Examination:  General obese 54 year old female currently in the prone position on heavy sedation HEENT orally intubated Pulmonary equal chest rise remarkably diminished bilaterally Ventilator settings: FiO2 60 PEEP12 Plateau pressure 25 Driving pressure 13 Cardiac regular rate and rhythm Abdomen soft obese  nontender Extremities warm and dry brisk cap refill GU clear yellow Neuro heavily sedated, requiring as needed neuromuscular blockade     Resolved Hospital Problem list     Assessment & Plan:  Septic shock 1/7; may be a cardiogenic component  cultures still pending, etiology not clear, white blood cell count coming down Plan Day #6 vancomycin, I think we can discontinue this as she has had no staph growth thus far, and MRSA PCR was negative Day #2 meropenem and Cipro D/C antibiotics with neg cultures for > 7 days now.    CVP goal 8-10 Mean arterial pressure goal greater than 65 Follow-up pending culture data Check SVO 2  & BNP,   Acute hypoxic respiratory failure with ARDS due to COVID, further complicated by element of pulmonary edema and new systemic cardiomyopathy superimposed on underlying obesity hypoventilation syndrome and sleep apnea --CXR today with significant improvement in airspace  Plan  Continue low tidal volume ventilation at 6 mL/kg predicted body weight Plateau pressure goal less than 30, driving pressure goal less than 15 Supine arterial blood gas when turned supine at 6 p.m. tonightContinuing prone protocol as long as P/F ratio less than 150 Continue diuresis as renal function permits VAP bundle PAD protocol, RASS goal -4 Continue sedating drips currently on**fentanyl and Versed, clonazepam and oxycodone added on 1/7  Systolic cardiomyopathy, newly reduced EF down to 45 to [id number], grade 1 diastolic heart failure Plan Continue telemetry monitoring   F/E/N: -continue TF; ask nutrition to evaluate protein and adjust -monitor and replace electrolytes as needed (esp K and Mg in the setting of lasix diuresis) -SSI per primary team  Mild AKI Plan MAP goal >65 Renal adjust medications A.m. chemistry Strict intake output  DM2 with hyperglycemia Plan -per primary service;  Tube feeding: Plan Continue tube feeds    Best practice:  Diet: TF;   Pain/Anxiety/Delirium protocol (if indicated): as above VAP protocol (if indicated): yes DVT prophylaxi: lovenox 0.5mg /kg per protocol GI prophylaxis: pepcid for stress ulcer prophylaxis Glucose control: SSI Mobility: bed rest Code Status: full Family Communication: per TRH Disposition: remain in ICU   I have independently seen and examined the patient, reviewed data, and developed an assessment and plan. A total of 33 minutes were spent in critical care assessment and medical decision making. This critical care time does not reflect procedure time, or teaching time or supervisory time of PA/NP/Med student/Med Resident, etc but could involve care discussion time.  Bonna Gains, MD PhD 01/21/19 11:39 AM

## 2019-01-21 NOTE — Progress Notes (Signed)
Pt supined with complications.  ETT re-taped and secure.

## 2019-01-21 NOTE — Progress Notes (Signed)
Endotool program prompting RN to transition to SQ insulin at this time. Dr. [place] Pink updated. Per MD, will continue insulin gtt for now d/t previous failed attempts to transition to SQ insulin.

## 2019-01-21 NOTE — Progress Notes (Signed)
E-link notified of CBG of 440 mg/dL. Patient insulin infusion was turned off at 2100 1/9 two hours after levemir administration per order. CBGs have steadily climbed since then even with subcutaneous coverage.

## 2019-01-21 NOTE — Progress Notes (Signed)
Pt's head turn to left.  Arms re-adjusted.  No complications. ETT is secure

## 2019-01-22 DIAGNOSIS — J80 Acute respiratory distress syndrome: Secondary | ICD-10-CM

## 2019-01-22 LAB — GLUCOSE, CAPILLARY
Glucose-Capillary: 144 mg/dL — ABNORMAL HIGH (ref 70–99)
Glucose-Capillary: 154 mg/dL — ABNORMAL HIGH (ref 70–99)
Glucose-Capillary: 155 mg/dL — ABNORMAL HIGH (ref 70–99)
Glucose-Capillary: 166 mg/dL — ABNORMAL HIGH (ref 70–99)
Glucose-Capillary: 150 mg/dL — ABNORMAL HIGH (ref 70–99)
Glucose-Capillary: 162 mg/dL — ABNORMAL HIGH (ref 70–99)
Glucose-Capillary: 160 mg/dL — ABNORMAL HIGH (ref 70–99)
Glucose-Capillary: 160 mg/dL — ABNORMAL HIGH (ref 70–99)
Glucose-Capillary: 149 mg/dL — ABNORMAL HIGH (ref 70–99)
Glucose-Capillary: 168 mg/dL — ABNORMAL HIGH (ref 70–99)
Glucose-Capillary: 267 mg/dL — ABNORMAL HIGH (ref 70–99)
Glucose-Capillary: 440 mg/dL — ABNORMAL HIGH (ref 70–99)
Glucose-Capillary: 208 mg/dL — ABNORMAL HIGH (ref 70–99)
Glucose-Capillary: 167 mg/dL — ABNORMAL HIGH (ref 70–99)
Glucose-Capillary: 137 mg/dL — ABNORMAL HIGH (ref 70–99)
Glucose-Capillary: 140 mg/dL — ABNORMAL HIGH (ref 70–99)
Glucose-Capillary: 152 mg/dL — ABNORMAL HIGH (ref 70–99)
Glucose-Capillary: 170 mg/dL — ABNORMAL HIGH (ref 70–99)
Glucose-Capillary: 153 mg/dL — ABNORMAL HIGH (ref 70–99)
Glucose-Capillary: 178 mg/dL — ABNORMAL HIGH (ref 70–99)

## 2019-01-22 LAB — CBC
HCT: 39.9 % (ref 36.0–46.0)
Hemoglobin: 12.7 g/dL (ref 12.0–15.0)
MCH: 25.6 pg — ABNORMAL LOW (ref 26.0–34.0)
MCHC: 31.8 g/dL (ref 30.0–36.0)
MCV: 80.4 fL (ref 80.0–100.0)
Platelets: 316 10*3/uL (ref 150–400)
RBC: 4.96 MIL/uL (ref 3.87–5.11)
RDW: 18.8 % — ABNORMAL HIGH (ref 11.5–15.5)
WBC: 12.3 10*3/uL — ABNORMAL HIGH (ref 4.0–10.5)
nRBC: 0.7 % — ABNORMAL HIGH (ref 0.0–0.2)

## 2019-01-22 LAB — COMPREHENSIVE METABOLIC PANEL
ALT: 59 U/L — ABNORMAL HIGH (ref 0–44)
AST: 40 U/L (ref 15–41)
Albumin: 2.6 g/dL — ABNORMAL LOW (ref 3.5–5.0)
Alkaline Phosphatase: 63 U/L (ref 38–126)
Anion gap: 13 (ref 5–15)
BUN: 47 mg/dL — ABNORMAL HIGH (ref 6–20)
CO2: 34 mmol/L — ABNORMAL HIGH (ref 22–32)
Calcium: 8.5 mg/dL — ABNORMAL LOW (ref 8.9–10.3)
Chloride: 107 mmol/L (ref 98–111)
Creatinine, Ser: 0.7 mg/dL (ref 0.44–1.00)
GFR calc Af Amer: >60 mL/min (ref >60)
GFR calc non Af Amer: >60 mL/min (ref >60)
Glucose, Bld: 143 mg/dL — ABNORMAL HIGH (ref 70–99)
Potassium: 4 mmol/L (ref 3.5–5.1)
Sodium: 154 mmol/L — ABNORMAL HIGH (ref 135–145)
Total Bilirubin: 0.6 mg/dL (ref 0.3–1.2)
Total Protein: 5.4 g/dL — ABNORMAL LOW (ref 6.5–8.1)

## 2019-01-22 LAB — POCT I-STAT 7, (LYTES, BLD GAS, ICA,H+H)
Acid-Base Excess: 16 mmol/L — ABNORMAL HIGH (ref 0.0–2.0)
Bicarbonate: 41.2 mmol/L — ABNORMAL HIGH (ref 20.0–28.0)
Calcium, Ion: 1.27 mmol/L (ref 1.15–1.40)
HCT: 37 % (ref 36.0–46.0)
Hemoglobin: 12.6 g/dL (ref 12.0–15.0)
O2 Saturation: 97 %
Potassium: 4 mmol/L (ref 3.5–5.1)
Sodium: 153 mmol/L — ABNORMAL HIGH (ref 135–145)
TCO2: 43 mmol/L — ABNORMAL HIGH (ref 22–32)
pCO2 arterial: 51.8 mmHg — ABNORMAL HIGH (ref 32.0–48.0)
pH, Arterial: 7.508 — ABNORMAL HIGH (ref 7.350–7.450)
pO2, Arterial: 81 mmHg — ABNORMAL LOW (ref 83.0–108.0)

## 2019-01-22 MED ORDER — IPRATROPIUM-ALBUTEROL 0.5-2.5 (3) MG/3ML IN SOLN: 3.0000 mL | RESPIRATORY_TRACT | Status: DC | PRN

## 2019-01-22 MED ORDER — FREE WATER
200.0000 mL | Freq: Four times a day (QID) | Status: DC
  Administered 2019-01-22 – 2019-01-23 (×4): 200 mL

## 2019-01-22 NOTE — Progress Notes (Signed)
Cloth tape removed.  ETT secured with commercial tube holder.

## 2019-01-22 NOTE — Progress Notes (Signed)
Inpatient Diabetes Program Recommendations  AACE/ADA: New Consensus Statement on Inpatient Glycemic Control (2015)  Target Ranges:  Prepandial:   less than 140 mg/dL      Peak postprandial:   less than 180 mg/dL (1-2 hours)      Critically ill patients:  140 - 180 mg/dL   Lab Results  Component Value Date   GLUCAP 160 (H) 01/22/2019   HGBA1C 7.1 (H) 01/13/2019    Review of Glycemic Control Diabetes history: DM 2 Outpatient Diabetes medications:  Insulin pump-Medtronic insulin pump Average total insulin daily 152 units/day (basal insulin=78 units daily)-per note from Dr. Tamala Julian Endocrinology in 08/2018 12 am - 2.5 units/hr, 8 am- 3.5 units/hr, 12 pm- 3.5 units/hr, 4 pm- 3.75 units/hr, 10 pm- 3.75 units/hr. Her insulin to carb ratio is now 1:5 and her sensitivity is 1:25. Ozempic 0.25 weekly Current orders for Inpatient glycemic control:  IV insulin  Inpatient Diabetes Program Recommendations:    Per review, patient has been requiring IV insulin doses >10 units/hr. EndoTool now alarming every 2 hours for blood sugar monitoring.  Would not recommend transition off insulin drip until IV insulin doses <8 units/hr.  Also consider using COVID-19 order set for transition.  Will follow.  Thanks,  Adah Perl, RN, BC-ADM Inpatient Diabetes Coordinator Pager [phone] (8a-5p)

## 2019-01-22 NOTE — Progress Notes (Signed)
Assisted tele visit to patient with daughter.  Tresten Pantoja McEachran, RN  

## 2019-01-22 NOTE — Progress Notes (Addendum)
NAME:  Kaitlin [name], MRN:  [id number], DOB:  September 11, 1965, LOS: 86 ADMISSION DATE:  01/12/2019, CONSULTATION DATE:  1/4 REFERRING MD:  Olevia Bowens, CHIEF COMPLAINT:  Dyspnea   Brief History   54 y/o female admitted 1/1 with COVID pneumonia, worsening hypoxemia and intubated on 1/4 requiring intubation.   Past Medical History  Obesity DM2 HTN Chronic diastolic heart failrue Sickle trait Recent thymoma  Significant Hospital Events   1/01 Admitted started on supplemental oxygen, remdesivir, and systemic steroids  1/02 received Actemra 1/03 Oxygen needs improving, on remdesivir and steroids inflammatory markers starting to improve 1/04 Oxygen requirements increasing, had been down to 6 to 8 L via nasal cannula, this required titration up to 15 L.  She was more anxious.  There was not significant change in her chest x-ray.  The patient became more confused in the early evening hours got out of bed without oxygen after this event more obtunded pulse oximetry 82 to 86% on high flow nasal cannula and nonrebreather she was unresponsive to sternal rub she was intubated at [place].  Post intubation ABG still hypercarbic, had significant hypoxia 1/05 Aggressive IV diuresis started, proning protocol with heavy sedation initiated, left IJ triple-lumen catheter placed, echo ordered. 1/06 Newly identified LV dysfunction by echocardiogram with EF 45 to 50%.  Still fairly hypoxic requiring high FiO2/PEEP ongoing supportive care.  Changing propofol to Versed given rising triglycerides.  Did become more hypotensive requiring escalation of vasoactive drips, and up titration of PEEP and FiO2 after being placed back in the supine position.  Remdesivir completed  1/07 Worsening shock requiring further titration of vasoactive drips, change from phenylephrine to norepinephrine and vasopressin, stress dose steroids started raising concern for possible sepsis, diuresis discontinued, antibiotics widened 1/08 Fever curve improved,  cultures all negative thus far, pressor requirements improved excision requirements improved  Consults:  PCCM  Procedures:  ETT 1/4 >>  L IJ CVL 1/5 >> Arterial R radial 1/6 >>   Significant Diagnostic Tests:  Echo 1/6>> Left ventricular ejection fraction, by visual estimation, is 45 to 50%. The left ventricle has mildly decreased function. The left ventricle is not well visualized. There is moderately increased left ventricular hypertrophy. Left ventricular diastolic parameters are indeterminate  Micro Data:  Coronavirus 2 antigen 1/1 >> Positive Blood cultures x2 1/1 >> Tracheal aspirate 1/7 >> negative  BCx2 1/7 >>   Antimicrobials:  Remdesivir 1/1 >> 1/6  Tocilizumab 1/2 x1  Decadron 1/1 >> to stress dose steroids 1/7 >> stopped 1/11  Vanco 1/4 >> 1/10  Cefepime 1/4 >> 1/7 Meropenem 1/7 >> 1/10  Cipro 1/7 >> 1/10  Interim history/subjective:  Afebrile Remains on low dose levophed  FiO2 40% / PEEP 12, Peak 29, Pplat 24 Insulin gtt ongoing   Objective   Blood pressure (!) 104/56, pulse (!) 105, temperature 98.2 F (36.8 C), temperature source Axillary, resp. rate (!) 24, height 5\' 9"  (1.753 m), weight (!) 176.4 kg, last menstrual period 06/17/2012, SpO2 96 %.    Vent Mode: PRVC FiO2 (%):  [40 %-80 %] 40 % Set Rate:  [24 bmp] 24 bmp Vt Set:  [400 mL] 400 mL PEEP:  [12 cmH20] 12 cmH20 Plateau Pressure:  [23 cmH20-25 cmH20] 25 cmH20   Intake/Output Summary (Last 24 hours) at 01/22/2019 1407 Last data filed at 01/22/2019 1000 Gross per 24 hour  Intake 1841.21 ml  Output 3075 ml  Net -1233.79 ml   Filed Weights   01/13/19 0413 01/20/19 0500 01/21/19 0430  Weight: Marland Kitchen)  178.2 kg (!) 160.9 kg (!) 176.4 kg    Examination: General: adult female, critically ill appearing on vent in NAD HEENT: MM pink/moist, ETT, skin sloughing on left cheek, nose with blistering  Neuro: sedate  CV: s1s2 RRR, no m/r/g PULM:  Synchronous, coarse bilaterally  GI: soft, bsx4 active,  protuberant Extremities: warm/dry, difficult to assess for edema due to body habitus  Skin: no rashes or lesions  Resolved Hospital Problem list     Assessment & Plan:   Acute Hypoxic Respiratory Failure with ARDS due to COVID Further complicated by element of pulmonary edema and new systemic cardiomyopathy superimposed on underlying obesity hypoventilation syndrome and sleep apnea. Completed remdesivir, toci, steroids.  -low Vt ventilation 4-8cc/kg -goal plateau pressure <30, driving pressure [id number] cm H2O -target PaO2 55-65, titrate PEEP/FiO2 per ARDS protocol  -no further proning due facial injuries  -goal CVP <4, diuresis as necessary -VAP prevention measures  -follow intermittent CXR, ABG  -stop stress dose steroids   Sedation Needs while on Mechanical Ventilation  -PAD protocol with fentanyl, versed -continue PT clonazepam, oxycodone -lighten RASS goal to -2  Shock Worsening 1/7, suspect septic but also element of cardiogenic with newly reduced LVEF.   -monitor off all abx -wean levophed for MAP >65, suspect sedation largest factor at this point  -CVP Qshift  -plan to remove aline 0000000  Systolic Cardiomyopathy, newly reduced EF down to 45 to 50% Grade 1 dCHF -tele monitoring   Mild AKI -Trend BMP / urinary output -Replace electrolytes as indicated -Avoid nephrotoxic agents, ensure adequate renal perfusion -consider lasix 1/12  DM2 with hyperglycemia -per primary SVC -stop steroids 1/11, should see improvement in glucose control     Best practice:  Diet: TF  Pain/Anxiety/Delirium protocol (if indicated): as above VAP protocol (if indicated): yes DVT prophylaxi: lovenox 0.5mg /kg per protocol GI prophylaxis: pepcid for stress ulcer prophylaxis Glucose control: insulin gtt per TRH  Mobility: BR Code Status: full code  Family Communication: per TRH Disposition: ICU   CC Time: 20 minutes    Noe Gens, MSN, NP-C [facility] 01/22/2019, 2:19 PM   Please see [email or web address] for pager details.

## 2019-01-22 NOTE — Progress Notes (Addendum)
[facility] PROGRESS NOTE    Progress Note  Kaitlin [name]  QJF:[id number] DOB: 1965-12-08 DOA: 01/12/2019 PCP: Olga Coaster, [facility]     Brief Narrative:   Kaitlin [name] is an 54 y.o. 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  In the ED she was found to be hypoxic with a chest x-ray with bilateral infiltrates.  Assessment/Plan:   Acute respiratory failure with hypoxia secondarily to Pneumonia due to COVID-19 virus:  Recent Labs  Lab 01/16/19 0545 01/17/19 0500 01/18/19 0120 01/19/19 0452 01/20/19 0500 01/21/19 0500 01/22/19 0450  DDIMER 2.52* 3.66* 7.03*  --   --   --   --   CRP 4.2* 3.8* 2.0*  --   --   --   --   ALT 33 31 53* 90* 88* 69* 59*  PROCALCITON <0.10 <0.10  --   --   --   --   --    She was placed on remdesivir and steroids.  She also received 1 dose of Actemra.  Patient continued to have high oxygen requirements and then she acutely decompensated.  She was transferred to the ICU and was intubated.   Patient was started on broad-spectrum antibiotics due to concern for secondary bacterial infection.  She had high WBC.  She was febrile.   Patient remains intubated and sedated.  Pulmonology is following.  No plans for prone position today due to skin breakdown on the face.  Acute on chronic systolic and diastolic congestive heart failure Echocardiogram shows EF of 45%. Patient with known diastolic dysfunction.  Patient remains on scheduled furosemide.  Continue ins and outs.  Daily weights.  No evidence for cardiogenic shock as her SvO2 was 77.  Septic shock Patient has stabilized.  She has been weaned off of pressors.  Stress dose steroids being tapered down.  Carvedilol is on hold.     Insulin-dependent diabetes mellitus type 2: Patient on insulin pump at home.  Patient has required IV insulin here.  It has been very difficult to transition her off of IV insulin due to significant hyperglycemia.  Diabetes coordinator is assisting with the Endo tool and transition.  HbA1c 7.1.     Hypernatremia Add free water down her tube.  May need to hold her Lasix.  HLD (hyperlipidemia): Continue statins.  Morbid obesity Estimated body mass index is 57.45 kg/m as calculated from the following:   Height as of this encounter: '5\' 9"'  (1.753 m).   Weight as of this encounter: 176.4 kg.  Nutrition On tube feedings.  Transaminitis Secondary to COVID-19.   DVT prophylaxis: On Lovenox 85 mg every 12 hours CODE STATUS: Full code Family Communication: PCCM has been updating family. Disposition Plan: Remain in ICU for now.  Disposition will depend on her clinical course in the hospital.    IV Access:    Peripheral IV   Procedures and diagnostic studies:   Transthoracic echocardiogram 01/16/2019  1. Left ventricular ejection fraction, by visual estimation, is 45 to 50%. The left ventricle has mildly decreased function. There is moderately increased left ventricular hypertrophy.  2. Left ventricular diastolic parameters are indeterminate.  3. Global right ventricle was not well visualized.The right ventricular size is not well visualized. Right vetricular wall thickness was not assessed.  4. Left atrial  size was normal.  5. The mitral valve is normal in structure. Trivial mitral valve regurgitation. No evidence of mitral stenosis.  6. The tricuspid valve is not well visualized.  7. The aortic valve was not well visualized. Aortic valve regurgitation is not visualized. No evidence of aortic valve sclerosis or stenosis.  8. The pulmonic valve was not well visualized. Pulmonic valve regurgitation is trivial.   Radiology Reports No results found.   Medical Consultants:     Pulmonology.  Anti-Infectives:   Anti-infectives (From admission, onward)   Start     Dose/Rate Route Frequency Ordered Stop   01/18/19 2300  vancomycin (VANCOCIN) IVPB 1000 mg/200 mL premix  Status:  Discontinued     1,000 mg 200 mL/hr over 60 Minutes Intravenous Every 12 hours 01/18/19 1631 01/21/19 1047   01/18/19 1000  meropenem (MERREM) 1 g in sodium chloride 0.9 % 100 mL IVPB  Status:  Discontinued     1 g 200 mL/hr over 30 Minutes Intravenous Every 8 hours 01/18/19 0903 01/21/19 1047   01/18/19 1000  ciprofloxacin (CIPRO) IVPB 400 mg  Status:  Discontinued     400 mg 200 mL/hr over 60 Minutes Intravenous Every 12 hours 01/18/19 0903 01/21/19 1047   01/16/19 1100  vancomycin (VANCOREADY) IVPB 1500 mg/300 mL  Status:  Discontinued     1,500 mg 150 mL/hr over 120 Minutes Intravenous Every 12 hours 01/15/19 1633 01/18/19 1631   01/16/19 0000  vancomycin (VANCOCIN) IVPB 1000 mg/200 mL premix     1,000 mg 200 mL/hr over 60 Minutes Intravenous  Once 01/15/19 2318 01/16/19 1436   01/15/19 1700  ceFEPIme (MAXIPIME) 2 g in sodium chloride 0.9 % 100 mL IVPB  Status:  Discontinued     2 g 200 mL/hr over 30 Minutes Intravenous Every 8 hours 01/15/19 1634 01/18/19 0902   01/15/19 1645  vancomycin (VANCOCIN) 2,500 mg in sodium chloride 0.9 % 500 mL IVPB  Status:  Discontinued     2,500 mg 250 mL/hr over 120 Minutes Intravenous  Once 01/15/19 1633 01/16/19 1435   01/14/19 1000  remdesivir 100 mg in sodium chloride 0.9 % 100 mL IVPB     100 mg 200 mL/hr over 30 Minutes Intravenous Daily 01/12/19 2341 01/17/19 1100   01/13/19 0300  remdesivir 200 mg in sodium chloride 0.9% 250 mL IVPB     200 mg 580 mL/hr over 30 Minutes Intravenous Once 01/12/19 2341 01/13/19 0715       Subjective:   Intubated and sedated.    Objective:    Vitals:   01/22/19 1000 01/22/19 1100 01/22/19 1115 01/22/19 1130  BP:      Pulse: 96 (!) 112 (!) 113 (!) 108  Resp:      Temp:      TempSrc:       SpO2: 94% 90% 98% 95%  Weight:      Height:       SpO2: 95 % O2 Flow Rate (L/min): 15 L/min FiO2 (%): (S) 40 %   Intake/Output Summary (Last 24 hours) at 01/22/2019 1312 Last data filed at 01/22/2019 1000 Gross per 24 hour  Intake 1930.91 ml  Output 3125 ml  Net -1194.09 ml   Filed Weights   01/13/19 0413 01/20/19 0500 01/21/19 0430  Weight: (!) 178.2 kg (!) 160.9 kg (!) 176.4 kg    General appearance: Intubated and sedated Skin breakdown noted over the face especially the nose. Resp: Crackles bilateral bases.  No wheezing or rhonchi. Cardio: S1-S2  is normal regular.  No S3-S4.  No rubs murmurs or bruit GI: Abdomen is soft.  Nontender nondistended.  Bowel sounds are present normal.  No masses organomegaly Extremities: Edema bilateral lower extremities Neurologic: Sedated.     Data Reviewed:    Labs: Basic Metabolic Panel: Recent Labs  Lab 01/16/19 0718 01/16/19 2040 01/17/19 0500 01/17/19 0500 01/17/19 1733 01/18/19 0120 01/19/19 0452 01/20/19 0500 01/20/19 1934 01/21/19 0500 01/21/19 2009 01/22/19 0450 01/22/19 0500  NA  --   --  143   < >  --  144 147* 146* 151* 145 151* 154* 153*  K  --   --  3.8   < >  --  4.4 3.8 3.8 4.2 4.3 4.1 4.0 4.0  CL  --   --  100  --   --  103 108 105  --  101  --  107  --   CO2  --   --  32  --   --  29 29 32  --  32  --  34*  --   GLUCOSE  --   --  120*  --   --  256* 267* 345*  --  447*  --  143*  --   BUN  --   --  27*  --   --  38* 45* 47*  --  53*  --  47*  --   CREATININE  --   --  0.89  --   --  1.03* 1.10* 1.08*  --  0.85  --  0.70  --   CALCIUM  --   --  8.5*  --   --  8.7* 8.6* 8.5*  --  8.3*  --  8.5*  --   MG 2.1 2.2 2.2  --  2.1  --   --   --   --   --   --   --   --   PHOS 3.5 3.9 4.1  --  4.4  --   --   --   --   --   --   --   --    < > = values in this interval not displayed.   GFR Estimated Creatinine Clearance: 144.9 mL/min (by C-G formula based on SCr of 0.7 mg/dL).  Liver Function Tests: Recent  Labs  Lab 01/18/19 0120 01/19/19 0452 01/20/19 0500 01/21/19 0500 01/22/19 0450  AST 70* 113* 80* 48* 40  ALT 53* 90* 88* 69* 59*  ALKPHOS 86 79 67 69 63  BILITOT 0.7 0.5 0.6 0.9 0.6  PROT 6.6 5.8* 5.5* 5.6* 5.4*  ALBUMIN 2.9* 2.6* 2.6* 2.7* 2.6*   COVID-19 Labs  No results for input(s): DDIMER, FERRITIN, LDH, CRP in the last 72 hours.  Lab Results  Component Value Date   [place] NEGATIVE 11/19/2018    CBC: Recent Labs  Lab 01/16/19 0545 01/17/19 0500 01/18/19 0120 01/19/19 0452 01/20/19 0500 01/20/19 1934 01/21/19 0500 01/21/19 2009 01/22/19 0450 01/22/19 0500  WBC 7.5 10.8* 15.7* 14.1* 14.9*  --  12.1*  --  12.3*  --   NEUTROABS 5.1 7.2 12.8* 10.7*  --   --   --   --   --   --   HGB 13.8 13.5 14.8 14.1 12.6 12.6 11.9* 12.9 12.7 12.6  HCT 41.8 41.6 47.1* 43.9 40.1 37.0 38.8 38.0 39.9 37.0  MCV 76.0* 76.6* 77.9* 79.4* 80.7  --  80.0  --  80.4  --   PLT  363 472* 581* 444* 386  --  317  --  316  --    CBG: Recent Labs  Lab 01/22/19 0632 01/22/19 0736 01/22/19 0823 01/22/19 1030 01/22/19 1300  GLUCAP 162* 160* 160* 168* 208*   Sepsis Labs: Recent Labs  Lab 01/16/19 0545 01/17/19 0500 01/19/19 0452 01/20/19 0500 01/21/19 0500 01/22/19 0450  PROCALCITON <0.10 <0.10  --   --   --   --   WBC 7.5 10.8* 14.1* 14.9* 12.1* 12.3*   Microbiology Recent Results (from the past 240 hour(s))  Blood Culture (routine x 2)     Status: None   Collection Time: 01/12/19  6:38 PM   Specimen: BLOOD  Result Value Ref Range Status   Specimen Description   Final    BLOOD RIGHT ANTECUBITAL Performed at [facility], [address] [place], [place] [address]    Special Requests   Final    BOTTLES DRAWN AEROBIC AND ANAEROBIC Blood Culture adequate volume Performed at [facility], [address] [place], [place] [address]    Culture   Final    NO GROWTH 5 DAYS Performed at [facility], [place] [address], [place], [place] [address]     Report Status 01/17/2019 FINAL  Final  SARS Coronavirus 2 Ag (30 min TAT) - Nasal Swab (BD Veritor Kit)     Status: Abnormal   Collection Time: 01/12/19  6:38 PM   Specimen: Nasal Swab (BD Veritor Kit)  Result Value Ref Range Status   SARS Coronavirus 2 Ag POSITIVE (A) NEGATIVE Final    Comment: RESULT CALLED TO, READ BACK BY AND VERIFIED WITH: Marsa Aris RN AT 1942 ON 01/12/19 BY [email or web address] (NOTE) SARS-CoV-2 antigen PRESENT. Positive results indicate the presence of viral antigens, but clinical correlation with patient history and other diagnostic information is necessary to determine patient infection status.  Positive results do not rule out bacterial infection or co-infection  with other viruses. False positive results are rare but can occur, and confirmatory RT-PCR testing may be appropriate in some circumstances. The expected result is Negative. Fact Sheet for Patients: PodPark.tn Fact Sheet for Providers: GiftContent.is  This test is not yet approved or cleared by the [place] FDA and  has been authorized for detection and/or diagnosis of SARS-CoV-2 by FDA under an Emergency Use Authorization (EUA).  This EUA will remain in effect (meaning this test can be used) for the duration of  the COVID-19  declaration under Section 564(b)(1) of the Act, 21 U.S.C. section 360bbb-3(b)(1), unless the authorization is terminated or revoked sooner. Performed at [facility], [address] [place], [place] [address]   Blood Culture (routine x 2)     Status: None   Collection Time: 01/12/19  7:36 PM   Specimen: BLOOD  Result Value Ref Range Status   Specimen Description   Final    BLOOD LEFT ANTECUBITAL Performed at [facility], [address] [place], [place] [address]    Special Requests   Final    BOTTLES DRAWN AEROBIC AND ANAEROBIC Blood Culture adequate volume Performed at [facility],  [address] [place], [place] [address]    Culture   Final    NO GROWTH 5 DAYS Performed at [facility], [place] [address], [place], [place] [address]    Report Status 01/17/2019 FINAL  Final  MRSA PCR Screening     Status: None  Collection Time: 01/18/19 10:00 AM   Specimen: Nasopharyngeal  Result Value Ref Range Status   MRSA by PCR NEGATIVE NEGATIVE Final    Comment:        The GeneXpert MRSA Assay (FDA approved for NASAL specimens only), is one component of a comprehensive MRSA colonization surveillance program. It is not intended to diagnose MRSA infection nor to guide or monitor treatment for MRSA infections. Performed at [facility], [place] [address] [place], [place] [address]   Culture, blood (routine x 2)     Status: None (Preliminary result)   Collection Time: 01/18/19 11:58 AM   Specimen: BLOOD  Result Value Ref Range Status   Specimen Description   Final    BLOOD LEFT ARM Performed at [place] [address] [place], [place] [address]    Special Requests   Final    BOTTLES DRAWN AEROBIC AND ANAEROBIC Blood Culture adequate volume Performed at [place] [address], [place], [place] [address]    Culture   Final    NO GROWTH 4 DAYS Performed at [facility], [place] [address] [place], [place] [address]    Report Status PENDING  Incomplete  Culture, blood (routine x 2)     Status: None (Preliminary result)   Collection Time: 01/18/19 12:05 PM   Specimen: BLOOD  Result Value Ref Range Status   Specimen Description   Final    BLOOD LEFT ARM Performed at [place] [address], [place], [place] [address]    Special Requests   Final    BOTTLES DRAWN AEROBIC AND ANAEROBIC Blood Culture adequate volume Performed at [place] [address] [place], [place] [address]    Culture   Final    NO GROWTH 4 DAYS Performed at [facility], [place] [address], [place], [place] [address]    Report Status PENDING  Incomplete  Culture, respiratory (non-expectorated)     Status: None   Collection Time: 01/18/19  3:25 PM   Specimen: Tracheal Aspirate; Respiratory  Result Value Ref Range Status   Specimen Description   Final    TRACHEAL ASPIRATE Performed at [place] [address], [place], [place] [address]    Special Requests   Final    NONE Performed at [facility], [place] [address] [place], [place] [address]    Gram Stain   Final    FEW WBC PRESENT, PREDOMINANTLY PMN NO ORGANISMS SEEN    Culture   Final    NO GROWTH 2 DAYS Performed at [place] [address], [place],  [address]    Report Status 01/20/2019 FINAL  Final     Medications:   . artificial tears  1 application Both Eyes M0N  . vitamin C  500 mg Per Tube Daily  . aspirin  81 mg Per Tube Daily  . chlorhexidine  15 mL Mouth/Throat BID  . Chlorhexidine Gluconate Cloth  6 each Topical Daily  . clonazePAM  2 mg Oral BID  . enoxaparin (LOVENOX) injection  85 mg Subcutaneous Q12H  . famotidine  20 mg Per Tube BID  . feeding supplement (PRO-STAT SUGAR FREE 64)  60 mL Per Tube TID  . feeding supplement (VITAL 1.5 CAL)  1,000 mL Per Tube Q24H  . free water  200 mL Per Tube Q6H  . furosemide  80 mg Intravenous Q12H  . hydrocortisone sod  succinate (SOLU-CORTEF) inj  50 mg Intravenous Q8H  . mouth rinse  15 mL Mouth Rinse 10 times per day  . neomycin-bacitracin-polymyxin   Topical Daily  . oxyCODONE  10 mg Oral Q6H  . rosuvastatin  20 mg Per Tube QPM  . cholecalciferol  1,000 Units Per Tube Daily  . zinc sulfate  220 mg Per Tube Daily   Continuous Infusions: . dextrose Stopped (01/19/19 2111)  . fentaNYL infusion INTRAVENOUS 300 mcg/hr (01/22/19 1118)  . insulin 17 Units/hr (01/22/19 1303)  . lactated ringers 20 mL/hr at 01/21/19 1201  . midazolam 9 mg/hr (01/22/19 1122)  .  norepinephrine (LEVOPHED) Adult infusion 6 mcg/min (01/22/19 0028)  . phenylephrine (NEO-SYNEPHRINE) Adult infusion Stopped (01/18/19 1149)  . vasopressin (PITRESSIN) infusion - *FOR SHOCK* Stopped (01/18/19 1243)     LOS: 10 days   [employer]  [facility]  01/22/2019, 1:12 PM

## 2019-01-23 ENCOUNTER — Inpatient Hospital Stay (HOSPITAL_COMMUNITY): Payer: Medicaid Other

## 2019-01-23 DIAGNOSIS — Z794 Long term (current) use of insulin: Secondary | ICD-10-CM

## 2019-01-23 DIAGNOSIS — E870 Hyperosmolality and hypernatremia: Secondary | ICD-10-CM

## 2019-01-23 DIAGNOSIS — E119 Type 2 diabetes mellitus without complications: Secondary | ICD-10-CM

## 2019-01-23 LAB — COMPREHENSIVE METABOLIC PANEL
ALT: 65 U/L — ABNORMAL HIGH (ref 0–44)
AST: 67 U/L — ABNORMAL HIGH (ref 15–41)
Albumin: 2.6 g/dL — ABNORMAL LOW (ref 3.5–5.0)
Alkaline Phosphatase: 63 U/L (ref 38–126)
Anion gap: 12 (ref 5–15)
BUN: 51 mg/dL — ABNORMAL HIGH (ref 6–20)
CO2: 39 mmol/L — ABNORMAL HIGH (ref 22–32)
Calcium: 8.9 mg/dL (ref 8.9–10.3)
Chloride: 105 mmol/L (ref 98–111)
Creatinine, Ser: 0.99 mg/dL (ref 0.44–1.00)
GFR calc Af Amer: >60 mL/min (ref >60)
GFR calc non Af Amer: >60 mL/min (ref >60)
Glucose, Bld: 162 mg/dL — ABNORMAL HIGH (ref 70–99)
Potassium: 3.3 mmol/L — ABNORMAL LOW (ref 3.5–5.1)
Sodium: 156 mmol/L — ABNORMAL HIGH (ref 135–145)
Total Bilirubin: 0.8 mg/dL (ref 0.3–1.2)
Total Protein: 5.3 g/dL — ABNORMAL LOW (ref 6.5–8.1)

## 2019-01-23 LAB — CULTURE, BLOOD (ROUTINE X 2)
Culture: NO GROWTH
Culture: NO GROWTH
Special Requests: ADEQUATE
Special Requests: ADEQUATE

## 2019-01-23 LAB — GLUCOSE, CAPILLARY
Glucose-Capillary: 106 mg/dL — ABNORMAL HIGH (ref 70–99)
Glucose-Capillary: 124 mg/dL — ABNORMAL HIGH (ref 70–99)
Glucose-Capillary: 127 mg/dL — ABNORMAL HIGH (ref 70–99)
Glucose-Capillary: 142 mg/dL — ABNORMAL HIGH (ref 70–99)
Glucose-Capillary: 144 mg/dL — ABNORMAL HIGH (ref 70–99)
Glucose-Capillary: 148 mg/dL — ABNORMAL HIGH (ref 70–99)
Glucose-Capillary: 149 mg/dL — ABNORMAL HIGH (ref 70–99)
Glucose-Capillary: 152 mg/dL — ABNORMAL HIGH (ref 70–99)
Glucose-Capillary: 153 mg/dL — ABNORMAL HIGH (ref 70–99)
Glucose-Capillary: 154 mg/dL — ABNORMAL HIGH (ref 70–99)
Glucose-Capillary: 155 mg/dL — ABNORMAL HIGH (ref 70–99)
Glucose-Capillary: 163 mg/dL — ABNORMAL HIGH (ref 70–99)
Glucose-Capillary: 169 mg/dL — ABNORMAL HIGH (ref 70–99)
Glucose-Capillary: 176 mg/dL — ABNORMAL HIGH (ref 70–99)
Glucose-Capillary: 181 mg/dL — ABNORMAL HIGH (ref 70–99)
Glucose-Capillary: 196 mg/dL — ABNORMAL HIGH (ref 70–99)
Glucose-Capillary: 201 mg/dL — ABNORMAL HIGH (ref 70–99)
Glucose-Capillary: 282 mg/dL — ABNORMAL HIGH (ref 70–99)

## 2019-01-23 LAB — CBC
HCT: 41 % (ref 36.0–46.0)
Hemoglobin: 12.8 g/dL (ref 12.0–15.0)
MCH: 25.7 pg — ABNORMAL LOW (ref 26.0–34.0)
MCHC: 31.2 g/dL (ref 30.0–36.0)
MCV: 82.3 fL (ref 80.0–100.0)
Platelets: 297 10*3/uL (ref 150–400)
RBC: 4.98 MIL/uL (ref 3.87–5.11)
RDW: 19.8 % — ABNORMAL HIGH (ref 11.5–15.5)
WBC: 16.2 10*3/uL — ABNORMAL HIGH (ref 4.0–10.5)
nRBC: 1.2 % — ABNORMAL HIGH (ref 0.0–0.2)

## 2019-01-23 LAB — MAGNESIUM: Magnesium: 2.7 mg/dL — ABNORMAL HIGH (ref 1.7–2.4)

## 2019-01-23 MED ORDER — INSULIN DETEMIR 100 UNIT/ML ~~LOC~~ SOLN
50.0000 [IU] | Freq: Two times a day (BID) | SUBCUTANEOUS | Status: DC
  Administered 2019-01-23 – 2019-01-24 (×3): 50 [IU] via SUBCUTANEOUS
  Filled 2019-01-23 (×4): qty 0.5

## 2019-01-23 MED ORDER — POTASSIUM CHLORIDE 20 MEQ/15ML (10%) PO SOLN
40.0000 meq | ORAL | Status: AC
  Administered 2019-01-23 (×2): 40 meq
  Filled 2019-01-23 (×2): qty 30

## 2019-01-23 MED ORDER — POTASSIUM CHLORIDE 20 MEQ/15ML (10%) PO SOLN
40.0000 meq | Freq: Once | ORAL | Status: AC
  Administered 2019-01-23: 15:00:00 40 meq
  Filled 2019-01-23: qty 30

## 2019-01-23 MED ORDER — INSULIN ASPART 100 UNIT/ML ~~LOC~~ SOLN
10.0000 [IU] | SUBCUTANEOUS | Status: DC
  Administered 2019-01-23 – 2019-01-24 (×6): 10 [IU] via SUBCUTANEOUS

## 2019-01-23 MED ORDER — ENOXAPARIN SODIUM 100 MG/ML ~~LOC~~ SOLN
85.0000 mg | Freq: Two times a day (BID) | SUBCUTANEOUS | Status: DC
  Administered 2019-01-23 – 2019-02-03 (×22): 85 mg via SUBCUTANEOUS
  Filled 2019-01-23 (×24): qty 1

## 2019-01-23 MED ORDER — VITAL AF 1.2 CAL PO LIQD
1000.0000 mL | ORAL | Status: DC
  Administered 2019-01-25 – 2019-01-31 (×2): 1000 mL

## 2019-01-23 MED ORDER — INSULIN ASPART 100 UNIT/ML ~~LOC~~ SOLN
0.0000 [IU] | SUBCUTANEOUS | Status: DC
  Administered 2019-01-23: 8 [IU] via SUBCUTANEOUS
  Administered 2019-01-23: 3 [IU] via SUBCUTANEOUS
  Administered 2019-01-23: 5 [IU] via SUBCUTANEOUS
  Administered 2019-01-24: 8 [IU] via SUBCUTANEOUS
  Administered 2019-01-24: 11 [IU] via SUBCUTANEOUS
  Administered 2019-01-24: 8 [IU] via SUBCUTANEOUS

## 2019-01-23 MED ORDER — FREE WATER
400.0000 mL | Freq: Four times a day (QID) | Status: DC
  Administered 2019-01-23 – 2019-01-25 (×9): 400 mL

## 2019-01-23 MED ORDER — DEXTROSE 10 % IV SOLN: INTRAVENOUS | Status: DC

## 2019-01-23 MED ORDER — FUROSEMIDE 10 MG/ML IJ SOLN
40.0000 mg | Freq: Once | INTRAMUSCULAR | Status: AC
  Administered 2019-01-23: 40 mg via INTRAVENOUS
  Filled 2019-01-23: qty 4

## 2019-01-23 NOTE — Discharge Instructions (Signed)
10 Things You Can Do to Manage Your COVID-19 Symptoms at Home If you have possible or confirmed COVID-19: 1. Stay home from work and school. And stay away from other public places. If you must go out, avoid using any kind of public transportation, ridesharing, or taxis. 2. Monitor your symptoms carefully. If your symptoms get worse, call your healthcare provider immediately. 3. Get rest and stay hydrated. 4. If you have a medical appointment, call the healthcare provider ahead of time and tell them that you have or may have COVID-19. 5. For medical emergencies, call 911 and notify the dispatch personnel that you have or may have COVID-19. 6. Cover your cough and sneezes with a tissue or use the inside of your elbow. 7. Wash your hands often with soap and water for at least 20 seconds or clean your hands with an alcohol-based hand sanitizer that contains at least 60% alcohol. 8. As much as possible, stay in a specific room and away from other people in your home. Also, you should use a separate bathroom, if available. If you need to be around other people in or outside of the home, wear a mask. 9. Avoid sharing personal items with other people in your household, like dishes, towels, and bedding. 10. Clean all surfaces that are touched often, like counters, tabletops, and doorknobs. Use household cleaning sprays or wipes according to the label instructions. cdc.gov/coronavirus 07/12/2018 This information is not intended to replace advice given to you by your health care provider. Make sure you discuss any questions you have with your health care provider. Document Revised: 12/14/2018 Document Reviewed: 12/14/2018 Elsevier Patient Education  2020 [employer]  

## 2019-01-23 NOTE — Progress Notes (Signed)
**Note Kaitlin-Identified via Obfuscation** [facility] PROGRESS NOTE    Progress Note  Kaitlin [name]  [id number] DOB: [birth year] DOA: 01/12/2019 PCP: Kaitlin [name], [facility]     Brief Narrative:   Kaitlin [name] is an 54 y.o. 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  In the ED she was found to be hypoxic with a chest x-ray with bilateral infiltrates.  Assessment/Plan:   Acute respiratory failure with hypoxia secondarily to Pneumonia due to COVID-19 virus:  Recent Labs  Lab 01/17/19 0500 01/18/19 0120 01/19/19 0452 01/20/19 0500 01/21/19 0500 01/22/19 0450 01/23/19 0508  DDIMER 3.66* 7.03*  --   --   --   --   --   CRP 3.8* 2.0*  --   --   --   --   --   ALT 31 53* 90* 88* 69* 59* 65*  PROCALCITON <0.10  --   --   --   --   --   --    She was placed on remdesivir and steroids.  She also received 1 dose of Actemra.  Patient continued to have high oxygen requirements and then she acutely decompensated.  She was transferred to the ICU and was intubated.   Patient was started on broad-spectrum antibiotics due to concern for secondary bacterial infection.  She had high WBC.  She was febrile.  CT scan was considered however not done due to patient's instability.  Plus pulmonology also did not think she needed CT scan at that time.  She is off of antibiotics now.  Patient remains intubated and sedated.  Pulmonology is following and managing.  Holding her Lasix due to hyponatremia.  Acute on chronic systolic and diastolic congestive heart failure Echocardiogram shows EF of 45%. Patient with known diastolic dysfunction.  Patient remains on scheduled furosemide.  Continue ins and outs.  Daily weights.  No evidence for cardiogenic shock as her SvO2 was  77.  Septic shock Patient has stabilized.  She has been weaned off of pressors.  Stress dose steroids being tapered down.  Carvedilol is on hold.    Insulin-dependent diabetes mellitus type 2: Patient on insulin pump at home.  Patient has required IV insulin here.  It has been very difficult to transition her off of IV insulin due to significant hyperglycemia.  Diabetes coordinator is assisting with the Endo tool and transition.  HbA1c 7.1.   Hopefully transition to subcutaneous insulin today.  Hypernatremia Sodium continues to climb.  Increase the dose of free water.  Hold Lasix.  Hypokalemia Will be repleted.  Magnesium 2.7.  HLD (hyperlipidemia): Continue statins.  Morbid obesity Estimated body mass index is 57.45 kg/m as calculated from the following:   Height as of this encounter: 5\' 9"  (1.753 m).   Weight as of this encounter: 176.4 kg.  Nutrition On tube feedings.  Transaminitis Secondary to COVID-19.   DVT prophylaxis: On Lovenox 85 mg every 12 hours CODE STATUS: Full code Family Communication: PCCM has been updating family. Disposition Plan: Remain in ICU for now.  Disposition will depend on her clinical course in the hospital.    IV Access:    Peripheral IV   Procedures and diagnostic studies:   Transthoracic echocardiogram 01/16/2019  1. Left ventricular ejection fraction, by visual estimation, is 45 to 50%. The left  ventricle has mildly decreased function. There is moderately increased left ventricular hypertrophy.  2. Left ventricular diastolic parameters are indeterminate.  3. Global right ventricle was not well visualized.The right ventricular size is not well visualized. Right vetricular wall thickness was not assessed.  4. Left atrial size was normal.  5. The mitral valve is normal in structure. Trivial mitral valve regurgitation. No evidence of mitral stenosis.  6. The tricuspid valve is not well visualized.  7. The aortic valve was not well  visualized. Aortic valve regurgitation is not visualized. No evidence of aortic valve sclerosis or stenosis.  8. The pulmonic valve was not well visualized. Pulmonic valve regurgitation is trivial.   Radiology Reports DG Abd 1 View  Result Date: 01/23/2019 CLINICAL DATA:  Status post OG tube placement. EXAM: ABDOMEN - 1 VIEW COMPARISON:  None. FINDINGS: OG tube is in place just within with the side port in the stomach. The tube could be advanced 2-3 cm for better positioning. Feeding tube with its tip at the ligament of Treitz noted. IMPRESSION: OG tube side port is near the gastroesophageal junction. Recommend advancement of 2-3 cm. Feeding tube in good position. Electronically Signed   By: Inge Rise M.D.   On: 01/23/2019 09:08   DG CHEST PORT 1 VIEW  Result Date: 01/23/2019 CLINICAL DATA:  Acute respiratory failure.  Hypoxia. EXAM: PORTABLE CHEST 1 VIEW COMPARISON:  01/20/2019.  01/19/2019.  01/18/2019.  01/16/2019. FINDINGS: Endotracheal tube tip 5 cm above the carina. Feeding tube tip noted over the midthoracic esophagus. Advancement of approximately 20 cm should be considered. Prior median sternotomy. Heart size stable. Diffuse severe bilateral pulmonary infiltrates noted on today's exam. Findings have worsened from prior exam. Bilateral pleural effusions noted. No pneumothorax. IMPRESSION: 1. Feeding tube tip noted over the midthoracic esophagus. Advancement of approximately 20 cm should be considered. 2.  Endotracheal tube and left IJ line in good anatomic position. 3. Progressive diffuse bilateral pulmonary infiltrates/edema noted on today's exam. Bilateral pleural effusions are also noted. Bilateral pneumonia and or CHF could present this fashion. Critical Value/emergent results were called by telephone at the time of interpretation on 01/23/2019 at 7:33 am to nurse Loma Sousa, who verbally acknowledged these results. Electronically Signed   By: Marcello Moores  Register   On: 01/23/2019 07:35      Medical Consultants:    Pulmonology.  Anti-Infectives:   Anti-infectives (From admission, onward)   Start     Dose/Rate Route Frequency Ordered Stop   01/18/19 2300  vancomycin (VANCOCIN) IVPB 1000 mg/200 mL premix  Status:  Discontinued     1,000 mg 200 mL/hr over 60 Minutes Intravenous Every 12 hours 01/18/19 1631 01/21/19 1047   01/18/19 1000  meropenem (MERREM) 1 g in sodium chloride 0.9 % 100 mL IVPB  Status:  Discontinued     1 g 200 mL/hr over 30 Minutes Intravenous Every 8 hours 01/18/19 0903 01/21/19 1047   01/18/19 1000  ciprofloxacin (CIPRO) IVPB 400 mg  Status:  Discontinued     400 mg 200 mL/hr over 60 Minutes Intravenous Every 12 hours 01/18/19 0903 01/21/19 1047   01/16/19 1100  vancomycin (VANCOREADY) IVPB 1500 mg/300 mL  Status:  Discontinued     1,500 mg 150 mL/hr over 120 Minutes Intravenous Every 12 hours 01/15/19 1633 01/18/19 1631   01/16/19 0000  vancomycin (VANCOCIN) IVPB 1000 mg/200 mL premix     1,000 mg 200 mL/hr over 60 Minutes Intravenous  Once 01/15/19 2318 01/16/19 1436   01/15/19 1700  ceFEPIme (  MAXIPIME) 2 g in sodium chloride 0.9 % 100 mL IVPB  Status:  Discontinued     2 g 200 mL/hr over 30 Minutes Intravenous Every 8 hours 01/15/19 1634 01/18/19 0902   01/15/19 1645  vancomycin (VANCOCIN) 2,500 mg in sodium chloride 0.9 % 500 mL IVPB  Status:  Discontinued     2,500 mg 250 mL/hr over 120 Minutes Intravenous  Once 01/15/19 1633 01/16/19 1435   01/14/19 1000  remdesivir 100 mg in sodium chloride 0.9 % 100 mL IVPB     100 mg 200 mL/hr over 30 Minutes Intravenous Daily 01/12/19 2341 01/17/19 1100   01/13/19 0300  remdesivir 200 mg in sodium chloride 0.9% 250 mL IVPB     200 mg 580 mL/hr over 30 Minutes Intravenous Once 01/12/19 2341 01/13/19 0715       Subjective:   She remains intubated and sedated   Objective:    Vitals:   01/23/19 1130 01/23/19 1145 01/23/19 1200 01/23/19 1300  BP:      Pulse: (!) 112 (!) 111 (!) 101 (!)  105  Resp:      Temp:      TempSrc:      SpO2: (!) 88% (!) 88% 90% (!) 86%  Weight:      Height:       SpO2: (!) 86 % O2 Flow Rate (L/min): 15 L/min FiO2 (%): 40 %   Intake/Output Summary (Last 24 hours) at 01/23/2019 1328 Last data filed at 01/23/2019 1300 Gross per 24 hour  Intake 1069.48 ml  Output 3325 ml  Net -2255.52 ml   Filed Weights   01/13/19 0413 01/20/19 0500 01/21/19 0430  Weight: (!) 178.2 kg (!) 160.9 kg (!) 176.4 kg     General appearance: Intubated and sedated Resp: Crackles bilateral bases.  No wheezing or rhonchi. Cardio: S1-S2 is normal regular.  No S3-S4.  No rubs murmurs or bruit GI: Abdomen is soft.  Nontender nondistended.  Bowel sounds are present normal.  No masses organomegaly Extremities: Improving edema lower extremities Neurologic: Sedated    Data Reviewed:    Labs: Basic Metabolic Panel: Recent Labs  Lab 01/16/19 2040 01/17/19 0500 01/17/19 1430 01/17/19 1733 01/19/19 0452 01/20/19 0500 01/21/19 0500 01/21/19 2009 01/22/19 0450 01/22/19 0500 01/23/19 0508  NA  --  143  --   --  147* 146* 145 151* 154* 153* 156*  K  --  3.8  --   --  3.8 3.8 4.3 4.1 4.0 4.0 3.3*  CL  --  100   < >  --  108 105 101  --  107  --  105  CO2  --  32   < >  --  29 32 32  --  34*  --  39*  GLUCOSE  --  120*   < >  --  267* 345* 447*  --  143*  --  162*  BUN  --  27*   < >  --  45* 47* 53*  --  47*  --  51*  CREATININE  --  0.89   < >  --  1.10* 1.08* 0.85  --  0.70  --  0.99  CALCIUM  --  8.5*   < >  --  8.6* 8.5* 8.3*  --  8.5*  --  8.9  MG 2.2 2.2  --  2.1  --   --   --   --   --   --  2.7*  PHOS 3.9 4.1  --  4.4  --   --   --   --   --   --   --    < > = values in this interval not displayed.   GFR Estimated Creatinine Clearance: 117.1 mL/min (by C-G formula based on SCr of 0.99 mg/dL).  Liver Function Tests: Recent Labs  Lab 01/19/19 0452 01/20/19 0500 01/21/19 0500 01/22/19 0450 01/23/19 0508  AST 113* 80* 48* 40 67*  ALT 90* 88*  69* 59* 65*  ALKPHOS 79 67 69 63 63  BILITOT 0.5 0.6 0.9 0.6 0.8  PROT 5.8* 5.5* 5.6* 5.4* 5.3*  ALBUMIN 2.6* 2.6* 2.7* 2.6* 2.6*   COVID-19 Labs  No results for input(s): DDIMER, FERRITIN, LDH, CRP in the last 72 hours.  Lab Results  Component Value Date   [place] NEGATIVE 11/19/2018    CBC: Recent Labs  Lab 01/17/19 0500 01/18/19 0120 01/19/19 0452 01/20/19 0500 01/21/19 0500 01/21/19 2009 01/22/19 0450 01/22/19 0500 01/23/19 0508  WBC 10.8* 15.7* 14.1* 14.9* 12.1*  --  12.3*  --  16.2*  NEUTROABS 7.2 12.8* 10.7*  --   --   --   --   --   --   HGB 13.5 14.8 14.1 12.6 11.9* 12.9 12.7 12.6 12.8  HCT 41.6 47.1* 43.9 40.1 38.8 38.0 39.9 37.0 41.0  MCV 76.6* 77.9* 79.4* 80.7 80.0  --  80.4  --  82.3  PLT 472* 581* 444* 386 317  --  316  --  297   CBG: Recent Labs  Lab 01/23/19 0746 01/23/19 0859 01/23/19 1009 01/23/19 1125 01/23/19 1242  GLUCAP 181* 149* 153* 155* 201*   Sepsis Labs: Recent Labs  Lab 01/17/19 0500 01/20/19 0500 01/21/19 0500 01/22/19 0450 01/23/19 0508  PROCALCITON <0.10  --   --   --   --   WBC 10.8* 14.9* 12.1* 12.3* 16.2*   Microbiology Recent Results (from the past 240 hour(s))  MRSA PCR Screening     Status: None   Collection Time: 01/18/19 10:00 AM   Specimen: Nasopharyngeal  Result Value Ref Range Status   MRSA by PCR NEGATIVE NEGATIVE Final    Comment:        The GeneXpert MRSA Assay (FDA approved for NASAL specimens only), is one component of a comprehensive MRSA colonization surveillance program. It is not intended to diagnose MRSA infection nor to guide or monitor treatment for MRSA infections. Performed at [facility], [place] [address], [place], [place] [address]   Culture, blood (routine x 2)     Status: None   Collection Time: 01/18/19 11:58 AM   Specimen: BLOOD  Result Value Ref Range Status   Specimen Description   Final    BLOOD LEFT ARM Performed at [place] [address], [place], [place] [address]    Special Requests   Final    BOTTLES DRAWN AEROBIC AND ANAEROBIC Blood Culture adequate volume Performed at [place] [address] [place], [place] [address]    Culture   Final    NO GROWTH 5 DAYS Performed at [facility], [place] [address], [place], [place] [address]    Report Status 01/23/2019 FINAL  Final  Culture, blood (routine x 2)     Status: None   Collection Time: 01/18/19 12:05 PM   Specimen: BLOOD  Result Value Ref Range Status   Specimen Description   Final    BLOOD LEFT  ARM Performed at [facility], [place] [address] [place], [place] [address]    Special Requests   Final    BOTTLES DRAWN AEROBIC AND ANAEROBIC Blood Culture adequate volume Performed at [place] [address] [place], [place] [address]    Culture   Final    NO GROWTH 5 DAYS Performed at [facility], [place] [address], [place], [place] [address]    Report Status 01/23/2019 FINAL  Final  Culture, respiratory (non-expectorated)     Status: None   Collection Time: 01/18/19  3:25 PM   Specimen: Tracheal Aspirate; Respiratory  Result Value Ref Range Status   Specimen Description   Final    TRACHEAL ASPIRATE Performed at [place] [address] [place], [place] [address]    Special Requests   Final    NONE Performed at [facility], [place] [address] [place], [place] [address]    Gram Stain   Final    FEW WBC PRESENT, PREDOMINANTLY PMN NO ORGANISMS SEEN    Culture   Final    NO GROWTH 2 DAYS Performed at [place] [address] [place], [place] [address]    Report Status 01/20/2019 FINAL  Final     Medications:   . vitamin C  500 mg Per Tube Daily  . aspirin  81 mg Per Tube Daily  . chlorhexidine  15 mL Mouth/Throat BID  . Chlorhexidine Gluconate Cloth  6 each Topical Daily  . clonazePAM  2 mg Oral BID  .  enoxaparin (LOVENOX) injection  85 mg Subcutaneous Q12H  . famotidine  20 mg Per Tube BID  . feeding supplement (PRO-STAT SUGAR FREE 64)  60 mL Per Tube TID  . feeding supplement (VITAL 1.5 CAL)  1,000 mL Per Tube Q24H  . free water  400 mL Per Tube Q6H  . insulin aspart  0-15 Units Subcutaneous Q4H  . insulin aspart  10 Units Subcutaneous Q4H  . insulin detemir  50 Units Subcutaneous BID  . mouth rinse  15 mL Mouth Rinse 10 times per day  . neomycin-bacitracin-polymyxin   Topical Daily  . oxyCODONE  10 mg Oral Q6H  . rosuvastatin  20 mg Per Tube QPM  . cholecalciferol  1,000 Units Per Tube Daily  . zinc sulfate  220 mg Per Tube Daily   Continuous Infusions: . dextrose    . fentaNYL infusion INTRAVENOUS 200 mcg/hr (01/23/19 1300)  . insulin 10 mL/hr at 01/23/19 1300  . lactated ringers 10 mL/hr at 01/23/19 1300  . midazolam 8 mg/hr (01/23/19 0630)  . norepinephrine (LEVOPHED) Adult infusion 2 mcg/min (01/22/19 1645)     LOS: 11 days   Abrahm Mancia  [facility]  01/23/2019, 1:28 PM

## 2019-01-23 NOTE — Progress Notes (Signed)
Nutrition Follow-up RD working remotely.  DOCUMENTATION CODES:   Morbid obesity  INTERVENTION:   Change TF regimen to meet re-estimated needs:  Vital AF 1.2 at 80 ml/h.  D/C Pro-stat.  Provides 2304 kcal, 144 gm protein, 1557 ml free water daily.  NUTRITION DIAGNOSIS:   Increased nutrient needs related to acute illness(COVID 19) as evidenced by estimated needs.  Ongoing   GOAL:   Patient will meet greater than or equal to 90% of their needs  Met with TF  MONITOR:   Vent status, TF tolerance, Labs, I & O's  ASSESSMENT:   54 yo female admitted with COVID PNA on 1/1. Developed progressive hypoxemia and required intubation 1/4. PMH includes DM, HLD, obesity, anemia, sickle cell trait, HTN, NICM, CHF, neuropathy.  Cortrak placed 1/6, tip is at LOT.  Patient remains intubated on ventilator support, no longer proning.  MV: 8.3 L/min Temp (24hrs), Avg:99 F (37.2 C), Min:98.2 F (36.8 C), Max:99.3 F (37.4 C)   Currently receiving Vital 1.5 at 25 ml/h with Pro-stat 60 ml TID via postpyloric Cortrak tube. Free water increased to 400 ml every 6 hours.  Labs reviewed. Sodium 156 (H), potassium 3.3 (L) CBG's: [phone]  Medications reviewed and include vitamin C, IV insulin drip, vitamin D3, zinc sulfate.  No weight since 1/10, 176.4 kg I/O - 6.4 L since admission  Diet Order:   Diet Order            Diet NPO time specified  Diet effective now              EDUCATION NEEDS:   Not appropriate for education at this time  Skin:  Skin Assessment: Reviewed RN Assessment(facial abrasions from previous proning)  Last BM:  1/12 rectal tube  Height:   Ht Readings from Last 1 Encounters:  01/13/19 '5\' 9"'  (1.753 m)    Weight:   Wt Readings from Last 1 Encounters:  01/21/19 (!) 176.4 kg    Ideal Body Weight:  65.9 kg  BMI:  Body mass index is 57.45 kg/m.  Estimated Nutritional Needs:   Kcal:  5003-7048  Protein:  135-165 gm  Fluid:   >/= 2.3 L    Molli Barrows, RD, LDN, [place] Pager [phone] After Hours Pager [phone]

## 2019-01-23 NOTE — Progress Notes (Signed)
Daughter updated at this time via phone. She appreciated update

## 2019-01-23 NOTE — Progress Notes (Signed)
NAME:  Kaitlin [name], MRN:  [id number], DOB:  [birth year], LOS: 8 ADMISSION DATE:  01/12/2019, CONSULTATION DATE:  1/4 REFERRING MD:  Olevia Bowens, CHIEF COMPLAINT:  Dyspnea   Brief History   54 y/o female admitted 1/1 with COVID pneumonia, worsening hypoxemia and intubated on 1/4 requiring intubation.   Past Medical History  Obesity DM2 HTN Chronic diastolic heart failrue Sickle trait Recent thymoma  Significant Hospital Events   1/01 Admitted started on supplemental oxygen, remdesivir, and systemic steroids  1/02 received Actemra 1/03 Oxygen needs improving, on remdesivir and steroids inflammatory markers starting to improve 1/04 Oxygen requirements increasing, had been down to 6 to 8 L via nasal cannula, this required titration up to 15 L.  She was more anxious.  There was not significant change in her chest x-ray.  The patient became more confused in the early evening hours got out of bed without oxygen after this event more obtunded pulse oximetry 82 to 86% on high flow nasal cannula and nonrebreather she was unresponsive to sternal rub she was intubated at [facility].  Post intubation ABG still hypercarbic, had significant hypoxia 1/05 Aggressive IV diuresis started, proning protocol with heavy sedation initiated, left IJ triple-lumen catheter placed, echo ordered. 1/06 Newly identified LV dysfunction by echocardiogram with EF 45 to 50%.  Still fairly hypoxic requiring high FiO2/PEEP ongoing supportive care.  Changing propofol to Versed given rising triglycerides.  Did become more hypotensive requiring escalation of vasoactive drips, and up titration of PEEP and FiO2 after being placed back in the supine position.  Remdesivir completed  1/07 Worsening shock requiring further titration of vasoactive drips, change from phenylephrine to norepinephrine and vasopressin, stress dose steroids started raising concern for possible sepsis, diuresis discontinued, antibiotics widened 1/08 Fever curve improved,  cultures all negative thus far, pressor requirements improved excision requirements improved  Consults:  PCCM  Procedures:  ETT 1/4 >>  L IJ CVL 1/5 >> Arterial R radial 1/6 >>   Significant Diagnostic Tests:  Echo 1/6>> Left ventricular ejection fraction, by visual estimation, is 45 to 50%. The left ventricle has mildly decreased function. The left ventricle is not well visualized. There is moderately increased left ventricular hypertrophy. Left ventricular diastolic parameters are indeterminate  Micro Data:  Coronavirus 2 antigen 1/1 >> Positive Blood cultures x2 1/1 >> Tracheal aspirate 1/7 >> negative  BCx2 1/7 >>   Antimicrobials:  Remdesivir 1/1 >> 1/6  Tocilizumab 1/2 x1  Decadron 1/1 >> to stress dose steroids 1/7 >> stopped 1/11  Vanco 1/4 >> 1/10  Cefepime 1/4 >> 1/7 Meropenem 1/7 >> 1/10  Cipro 1/7 >> 1/10  Interim history/subjective:  Afebrile Remains on low dose levophed  FiO2 40% / PEEP 12, Peak 29, Pplat 24 Insulin gtt ongoing  RT reports pt weaned for ~ 20 minutes this am.  Blood clot suctioned from ETT.  New scant bloody secretions from in-line suction.   Objective   Blood pressure (!) 110/53, pulse (!) 105, temperature 98.2 F (36.8 C), temperature source Axillary, resp. rate (!) 23, height 5\' 9"  (1.753 m), weight (!) 176.4 kg, last menstrual period 06/17/2012, SpO2 (!) 88 %.    Vent Mode: PRVC FiO2 (%):  [40 %-50 %] 50 % Set Rate:  [20 bmp-24 bmp] 20 bmp Vt Set:  [400 mL] 400 mL PEEP:  [10 cmH20] 10 cmH20 Plateau Pressure:  [19 cmH20-22 cmH20] 21 cmH20   Intake/Output Summary (Last 24 hours) at 01/23/2019 1447 Last data filed at 01/23/2019 1400 Gross per 24  hour  Intake 1032.81 ml  Output 3400 ml  Net -2367.19 ml   Filed Weights   01/13/19 0413 01/20/19 0500 01/21/19 0430  Weight: (!) 178.2 kg (!) 160.9 kg (!) 176.4 kg    Examination: General: adult female lying in bed on vent in NAD, critically ill appearing  HEENT: MM pink/moist, ETT,  facial abrasions from prior prone positioning Neuro: sedate CV: s1s2 RRR, no m/r/g PULM:  Non-labored on vent, lungs bilaterally clear anterior  GI: soft, bsx4 active  Extremities: warm/dry, no edema  Skin: small abrasions on nose/circular lesions on cheeks    Resolved Hospital Problem list     Assessment & Plan:   Acute Hypoxic Respiratory Failure with ARDS due to COVID Further complicated by element of pulmonary edema and new systemic cardiomyopathy superimposed on underlying obesity hypoventilation syndrome and sleep apnea. Completed remdesivir, toci, steroids.  -low Vt ventilation 4-8cc/kg -goal plateau pressure <30, driving pressure [id number] cm H2O -target PaO2 55-65, titrate PEEP/FiO2 per ARDS protocol  -no further prone therapy needed / would hold anyway due to facial trauma -goal CVP <4, diuresis as necessary -VAP prevention measures  -follow intermittent CXR   Sedation Needs while on Mechanical Ventilation  -continue PAD protocol with versed, fentanyl  -PT clonazepam, oxycodone  -RASS goal -1 to -2  Shock Worsened 1/7, suspect septic but also element of cardiogenic with newly reduced LVEF. Off pressors 1/11.  -follow off abx, pressors  -discontinue aline   Systolic Cardiomyopathy, newly reduced EF down to 45 to 50% Grade 1 dCHF -tele monitoring   Mild AKI -Continue free water  -lasix 40 mg IV x1 -Trend BMP / urinary output -Replace electrolytes as indicated -Avoid nephrotoxic agents, ensure adequate renal perfusion  DM2 with hyperglycemia -per primary, transition off gtt  Best practice:  Diet: TF  Pain/Anxiety/Delirium protocol (if indicated): as above VAP protocol (if indicated): yes DVT prophylaxi: lovenox 0.5mg /kg per protocol GI prophylaxis: pepcid for stress ulcer prophylaxis Glucose control: insulin gtt per TRH  Mobility: BR Code Status: full code  Family Communication: Daughter updated via phone 1/12  Disposition: ICU   CC Time: 55 minutes     Noe Gens, MSN, NP-C [facility] 01/23/2019, 2:47 PM   Please see [email or web address] for pager details.

## 2019-01-23 NOTE — Progress Notes (Signed)
Radiology called at this time to report feeding tube was noted to be in esophagus. Tube feeding held at this time. OG placed at 69 at lip. Air auscultated in LUQ. Xray placed for verification per protocol.

## 2019-01-23 NOTE — Progress Notes (Signed)
Inpatient Diabetes Program Recommendations  AACE/ADA: New Consensus Statement on Inpatient Glycemic Control (2015)  Target Ranges:  Prepandial:   less than 140 mg/dL      Peak postprandial:   less than 180 mg/dL (1-2 hours)      Critically ill patients:  140 - 180 mg/dL   Lab Results  Component Value Date   GLUCAP 149 (H) 01/23/2019   HGBA1C 7.1 (H) 01/13/2019    Review of Glycemic Control Results for Kaitlin [name], Kaitlin [name] (MRN [id number]) as of 01/23/2019 10:05  Ref. Range 01/23/2019 04:30 01/23/2019 05:40 01/23/2019 06:40 01/23/2019 07:46 01/23/2019 08:59  Glucose-Capillary Latest Ref Range: 70 - 99 mg/dL 163 (H) 124 (H) 154 (H) 181 (H) 149 (H)   Diabetes history: DM 2 Outpatient Diabetes medications: Insulin pump-[employer]tr[employer] insulin pump Average total insulin daily 152 units/day (basal insulin=78 units daily)-per note from Dr. Tamala [facility] in 08/2018 12 am - 2.5 units/hr, 8 am- 3.5 units/hr, 12 pm- 3.5 units/hr, 4 pm- 3.75 units/hr, 10 pm- 3.75 units/hr. Her insulin to carb ratio is now 1:5 and her sensitivity is 1:25. Ozempic 0.25 weekly Current orders for Inpatient glycemic control: IV insulin Inpatient Diabetes Program Recommendations:   Insulin drip rates 6.5 units/ hr and tube feeds on hold.   Daily insulin needs are high.  Based on current drip rate, she will need approximately 124 units of basal insulin daily.  However once tube feeds resumed, she will also need tube feed coverage.  At this point, it is safest to continue IV insulin (currently has q 2 hour blood sugar checks).    Will follow.   THanks  Adah Perl, RN, BC-ADM Inpatient Diabetes Coordinator Pager [phone] (8a-5p)

## 2019-01-24 DIAGNOSIS — A419 Sepsis, unspecified organism: Secondary | ICD-10-CM

## 2019-01-24 DIAGNOSIS — R6521 Severe sepsis with septic shock: Secondary | ICD-10-CM

## 2019-01-24 LAB — CBC
HCT: 43 % (ref 36.0–46.0)
Hemoglobin: 12.9 g/dL (ref 12.0–15.0)
MCH: 25.3 pg — ABNORMAL LOW (ref 26.0–34.0)
MCHC: 30 g/dL (ref 30.0–36.0)
MCV: 84.3 fL (ref 80.0–100.0)
Platelets: 292 10*3/uL (ref 150–400)
RBC: 5.1 MIL/uL (ref 3.87–5.11)
RDW: 20.2 % — ABNORMAL HIGH (ref 11.5–15.5)
WBC: 16.7 10*3/uL — ABNORMAL HIGH (ref 4.0–10.5)
nRBC: 0.9 % — ABNORMAL HIGH (ref 0.0–0.2)

## 2019-01-24 LAB — COMPREHENSIVE METABOLIC PANEL
ALT: 121 U/L — ABNORMAL HIGH (ref 0–44)
AST: 112 U/L — ABNORMAL HIGH (ref 15–41)
Albumin: 2.6 g/dL — ABNORMAL LOW (ref 3.5–5.0)
Alkaline Phosphatase: 74 U/L (ref 38–126)
Anion gap: 8 (ref 5–15)
BUN: 40 mg/dL — ABNORMAL HIGH (ref 6–20)
CO2: 39 mmol/L — ABNORMAL HIGH (ref 22–32)
Calcium: 9 mg/dL (ref 8.9–10.3)
Chloride: 110 mmol/L (ref 98–111)
Creatinine, Ser: 0.94 mg/dL (ref 0.44–1.00)
GFR calc Af Amer: >60 mL/min (ref >60)
GFR calc non Af Amer: >60 mL/min (ref >60)
Glucose, Bld: 300 mg/dL — ABNORMAL HIGH (ref 70–99)
Potassium: 4.1 mmol/L (ref 3.5–5.1)
Sodium: 157 mmol/L — ABNORMAL HIGH (ref 135–145)
Total Bilirubin: 0.9 mg/dL (ref 0.3–1.2)
Total Protein: 5.5 g/dL — ABNORMAL LOW (ref 6.5–8.1)

## 2019-01-24 LAB — GLUCOSE, CAPILLARY
Glucose-Capillary: 248 mg/dL — ABNORMAL HIGH (ref 70–99)
Glucose-Capillary: 241 mg/dL — ABNORMAL HIGH (ref 70–99)
Glucose-Capillary: 314 mg/dL — ABNORMAL HIGH (ref 70–99)
Glucose-Capillary: 284 mg/dL — ABNORMAL HIGH (ref 70–99)
Glucose-Capillary: 295 mg/dL — ABNORMAL HIGH (ref 70–99)
Glucose-Capillary: 208 mg/dL — ABNORMAL HIGH (ref 70–99)

## 2019-01-24 MED ORDER — INSULIN DETEMIR 100 UNIT/ML ~~LOC~~ SOLN
55.0000 [IU] | Freq: Two times a day (BID) | SUBCUTANEOUS | Status: DC
  Filled 2019-01-24: qty 0.55

## 2019-01-24 MED ORDER — INSULIN DETEMIR 100 UNIT/ML ~~LOC~~ SOLN
58.0000 [IU] | Freq: Two times a day (BID) | SUBCUTANEOUS | Status: DC
  Administered 2019-01-24: 58 [IU] via SUBCUTANEOUS
  Filled 2019-01-24 (×2): qty 0.58

## 2019-01-24 MED ORDER — INSULIN ASPART 100 UNIT/ML ~~LOC~~ SOLN
0.0000 [IU] | SUBCUTANEOUS | Status: DC
  Administered 2019-01-24: 7 [IU] via SUBCUTANEOUS
  Administered 2019-01-24 (×2): 11 [IU] via SUBCUTANEOUS
  Administered 2019-01-25: 15 [IU] via SUBCUTANEOUS
  Administered 2019-01-25: 20 [IU] via SUBCUTANEOUS
  Administered 2019-01-25: 7 [IU] via SUBCUTANEOUS
  Administered 2019-01-25: 4 [IU] via SUBCUTANEOUS
  Administered 2019-01-25: 15 [IU] via SUBCUTANEOUS
  Administered 2019-01-26 (×2): 7 [IU] via SUBCUTANEOUS
  Administered 2019-01-26: 11 [IU] via SUBCUTANEOUS
  Administered 2019-01-26: 20 [IU] via SUBCUTANEOUS
  Administered 2019-01-27: 4 [IU] via SUBCUTANEOUS
  Administered 2019-01-27 (×3): 7 [IU] via SUBCUTANEOUS
  Administered 2019-01-27: 4 [IU] via SUBCUTANEOUS
  Administered 2019-01-27 – 2019-01-28 (×4): 7 [IU] via SUBCUTANEOUS
  Administered 2019-01-28: 11 [IU] via SUBCUTANEOUS
  Administered 2019-01-28: 15 [IU] via SUBCUTANEOUS
  Administered 2019-01-28: 7 [IU] via SUBCUTANEOUS
  Administered 2019-01-28: 11 [IU] via SUBCUTANEOUS
  Administered 2019-01-29 (×3): 4 [IU] via SUBCUTANEOUS
  Administered 2019-01-29: 7 [IU] via SUBCUTANEOUS
  Administered 2019-01-29 – 2019-01-30 (×4): 4 [IU] via SUBCUTANEOUS
  Administered 2019-01-30: 3 [IU] via SUBCUTANEOUS
  Administered 2019-01-30: 7 [IU] via SUBCUTANEOUS
  Administered 2019-01-31 (×3): 3 [IU] via SUBCUTANEOUS
  Administered 2019-01-31: 4 [IU] via SUBCUTANEOUS
  Administered 2019-01-31: 08:00:00 0 [IU] via SUBCUTANEOUS
  Administered 2019-02-01: 13:00:00 4 [IU] via SUBCUTANEOUS
  Administered 2019-02-01: 3 [IU] via SUBCUTANEOUS
  Administered 2019-02-01 – 2019-02-02 (×2): 4 [IU] via SUBCUTANEOUS

## 2019-01-24 MED ORDER — INSULIN ASPART 100 UNIT/ML ~~LOC~~ SOLN
14.0000 [IU] | SUBCUTANEOUS | Status: DC
  Administered 2019-01-24 – 2019-01-25 (×6): 14 [IU] via SUBCUTANEOUS

## 2019-01-24 MED ORDER — FUROSEMIDE 10 MG/ML IJ SOLN
40.0000 mg | Freq: Once | INTRAMUSCULAR | Status: AC
  Administered 2019-01-24: 40 mg via INTRAVENOUS
  Filled 2019-01-24: qty 4

## 2019-01-24 NOTE — Progress Notes (Signed)
Inpatient Diabetes Program Recommendations  AACE/ADA: New Consensus Statement on Inpatient Glycemic Control (2015)  Target Ranges:  Prepandial:   less than 140 mg/dL      Peak postprandial:   less than 180 mg/dL (1-2 hours)      Critically ill patients:  140 - 180 mg/dL   Lab Results  Component Value Date   GLUCAP 241 (H) 01/24/2019   HGBA1C 7.1 (H) 01/13/2019    Review of Glycemic Control Results for DAROTHY, CHAPPELLE (MRN [id number]) as of 01/24/2019 07:10  Ref. Range 01/23/2019 16:04 01/23/2019 17:03 01/23/2019 23:21 01/24/2019 03:07  Glucose-Capillary Latest Ref Range: 70 - 99 mg/dL 176 (H) 196 (H) 282 (H) 241 (H)   Inpatient Diabetes Program Recommendations:    Consider increasing Levemir to 58 units bid and increase tube feed coverage to 12 units q 4 hours.   Thanks,  Adah Perl, RN, BC-ADM Inpatient Diabetes Coordinator Pager [phone]

## 2019-01-24 NOTE — Progress Notes (Signed)
NAME:  Kaitlin [name], MRN:  [id number], DOB:  11/16/1965, LOS: 30 ADMISSION DATE:  01/12/2019, CONSULTATION DATE:  1/4 REFERRING MD:  Olevia Bowens, CHIEF COMPLAINT:  Dyspnea   Brief History   54 y/o female admitted 1/1 with COVID pneumonia, worsening hypoxemia and intubated on 1/4 requiring intubation.   Past Medical History  Obesity DM2 HTN Chronic diastolic heart failrue Sickle trait Recent thymoma  Significant Hospital Events   1/01 Admitted started on supplemental oxygen, remdesivir, and systemic steroids  1/02 received Actemra 1/03 Oxygen needs improving, on remdesivir and steroids inflammatory markers starting to improve 1/04 Oxygen requirements increasing, had been down to 6 to 8 L via nasal cannula, this required titration up to 15 L.  She was more anxious.  There was not significant change in her chest x-ray.  The patient became more confused in the early evening hours got out of bed without oxygen after this event more obtunded pulse oximetry 82 to 86% on high flow nasal cannula and nonrebreather she was unresponsive to sternal rub she was intubated at [place].  Post intubation ABG still hypercarbic, had significant hypoxia 1/05 Aggressive IV diuresis started, proning protocol with heavy sedation initiated, left IJ triple-lumen catheter placed, echo ordered. 1/06 Newly identified LV dysfunction by echocardiogram with EF 45 to 50%.  Still fairly hypoxic requiring high FiO2/PEEP ongoing supportive care.  Changing propofol to Versed given rising triglycerides.  Did become more hypotensive requiring escalation of vasoactive drips, and up titration of PEEP and FiO2 after being placed back in the supine position.  Remdesivir completed  1/07 Worsening shock requiring further titration of vasoactive drips, change from phenylephrine to norepinephrine and vasopressin, stress dose steroids started raising concern for possible sepsis, diuresis discontinued, antibiotics widened 1/08 Fever curve improved,  cultures all negative thus far, pressor requirements improved excision requirements improved  Consults:  PCCM  Procedures:  ETT 1/4 >>  L IJ CVL 1/5 >> Arterial R radial 1/6 >> 1/13  Significant Diagnostic Tests:  Echo 1/6 >> Left ventricular ejection fraction, by visual estimation, is 45 to 50%. The left ventricle has mildly decreased function. The left ventricle is not well visualized. There is moderately increased left ventricular hypertrophy. Left ventricular diastolic parameters are indeterminate  Micro Data:  Coronavirus 2 antigen 1/1 >> Positive Blood cultures x2 1/1 >> negative Tracheal aspirate 1/7 >> negative  BCx2 1/7 >> negative  Antimicrobials:  Remdesivir 1/1 >> 1/6  Tocilizumab 1/2 x1  Decadron 1/1 >> to stress dose steroids 1/7 >> stopped 1/11  Vanco 1/4 >> 1/10  Cefepime 1/4 >> 1/7 Meropenem 1/7 >> 1/10  Cipro 1/7 >> 1/10  Interim history/subjective:  Tmax 100.6  Low dose levophed 3-79mcg's Weaning on PSV 10/10, starting 0900 Fent to 112mcg, versed to 2mg   No further bleeding from ETT   Objective   Blood pressure (!) 93/52, pulse (!) 124, temperature (!) 100.6 F (38.1 C), temperature source Axillary, resp. rate 18, height 5\' 9"  (1.753 m), weight (!) 173 kg, last menstrual period 06/17/2012, SpO2 91 %.    Vent Mode: CPAP;PSV FiO2 (%):  [50 %] 50 % Set Rate:  [20 bmp] 20 bmp Vt Set:  [400 mL] 400 mL PEEP:  [10 cmH20] 10 cmH20 Pressure Support:  [10 cmH20] 10 cmH20 Plateau Pressure:  [21 cmH20-23 cmH20] 23 cmH20   Intake/Output Summary (Last 24 hours) at 01/24/2019 1614 Last data filed at 01/24/2019 1530 Gross per 24 hour  Intake 3042.45 ml  Output 2665 ml  Net 377.45 ml  Filed Weights   01/20/19 0500 01/21/19 0430 01/24/19 0500  Weight: (!) 160.9 kg (!) 176.4 kg (!) 173 kg    Examination: General: Adult female lying in bed on vent, critically ill-appearing HEENT: MM pink/moist, ETT, small abrasions on cheeks/nose from prior prone positioning  Neuro: Sedate CV: s1s2 RRR, no m/r/g PULM: Nonlabored on PSV, lungs bilaterally clear anterior, diminished bases GI: soft, bsx4 active  Extremities: warm/dry, no overt edema  Skin: no rashes or lesions  Resolved Hospital Problem list     Assessment & Plan:   Acute Hypoxic Respiratory Failure with ARDS due to COVID Further complicated by element of pulmonary edema and new systemic cardiomyopathy superimposed on underlying obesity hypoventilation syndrome and sleep apnea. Completed remdesivir, toci, steroids.  -low Vt ventilation 4-8cc/kg -goal plateau pressure <30, driving pressure [id number] cm H2O -target PaO2 55-65, titrate PEEP/FiO2 per ARDS protocol  -goal CVP <4, diuresis as necessary -VAP prevention measures  -follow intermittent CXR   Sedation Needs while on Mechanical Ventilation  -PAD protocol with Versed, fentanyl -Reduce dosing of IV sedation as able to allow for weaning -PT clonazepam, oxycodone -RASS goal -1 to -2  Shock Worsened 1/7, suspect septic but also element of cardiogenic with newly reduced LVEF. Off pressors 1/11, then required low dose.  Suspect sedation related.  -follow off abx -discontinue aline  -wean vasopressors for MAP [id number]  Systolic Cardiomyopathy, newly reduced EF down to 45 to 50% Grade 1 dCHF -tele monitoring    Mild AKI -free water PT  -lasix 40 mg IV x1 -Trend BMP / urinary output -Replace electrolytes as indicated -Avoid nephrotoxic agents, ensure adequate renal perfusion  DM2 with hyperglycemia -increase levemir, TF coverage per DM coordinator recommendations  Best practice:  Diet: TF  Pain/Anxiety/Delirium protocol (if indicated): as above VAP protocol (if indicated): yes DVT prophylaxi: lovenox 0.5mg /kg per protocol GI prophylaxis: pepcid for stress ulcer prophylaxis Glucose control: SSI Mobility: BR Code Status: full code  Family Communication: Daughter updated via phone 1/12  Disposition: ICU   CC Time: 34 minutes     Noe Gens, MSN, NP-C [facility] 01/24/2019, 4:14 PM   Please see [email or web address] for pager details.

## 2019-01-25 LAB — POCT I-STAT 7, (LYTES, BLD GAS, ICA,H+H)
Acid-Base Excess: 19 mmol/L — ABNORMAL HIGH (ref 0.0–2.0)
Bicarbonate: 46.8 mmol/L — ABNORMAL HIGH (ref 20.0–28.0)
Calcium, Ion: 1.26 mmol/L (ref 1.15–1.40)
HCT: 39 % (ref 36.0–46.0)
Hemoglobin: 13.3 g/dL (ref 12.0–15.0)
O2 Saturation: 95 %
Patient temperature: 100.4
Potassium: 4.4 mmol/L (ref 3.5–5.1)
Sodium: 154 mmol/L — ABNORMAL HIGH (ref 135–145)
TCO2: 49 mmol/L — ABNORMAL HIGH (ref 22–32)
pCO2 arterial: 69 mmHg (ref 32.0–48.0)
pH, Arterial: 7.443 (ref 7.350–7.450)
pO2, Arterial: 79 mmHg — ABNORMAL LOW (ref 83.0–108.0)

## 2019-01-25 LAB — COMPREHENSIVE METABOLIC PANEL
ALT: 211 U/L — ABNORMAL HIGH (ref 0–44)
AST: 177 U/L — ABNORMAL HIGH (ref 15–41)
Albumin: 2.7 g/dL — ABNORMAL LOW (ref 3.5–5.0)
Alkaline Phosphatase: 74 U/L (ref 38–126)
Anion gap: 9 (ref 5–15)
BUN: 33 mg/dL — ABNORMAL HIGH (ref 6–20)
CO2: 40 mmol/L — ABNORMAL HIGH (ref 22–32)
Calcium: 8.8 mg/dL — ABNORMAL LOW (ref 8.9–10.3)
Chloride: 106 mmol/L (ref 98–111)
Creatinine, Ser: 0.83 mg/dL (ref 0.44–1.00)
GFR calc Af Amer: >60 mL/min (ref >60)
GFR calc non Af Amer: >60 mL/min (ref >60)
Glucose, Bld: 209 mg/dL — ABNORMAL HIGH (ref 70–99)
Potassium: 4.4 mmol/L (ref 3.5–5.1)
Sodium: 155 mmol/L — ABNORMAL HIGH (ref 135–145)
Total Bilirubin: 1.1 mg/dL (ref 0.3–1.2)
Total Protein: 5.4 g/dL — ABNORMAL LOW (ref 6.5–8.1)

## 2019-01-25 LAB — GLUCOSE, CAPILLARY
Glucose-Capillary: 255 mg/dL — ABNORMAL HIGH (ref 70–99)
Glucose-Capillary: 188 mg/dL — ABNORMAL HIGH (ref 70–99)
Glucose-Capillary: 339 mg/dL — ABNORMAL HIGH (ref 70–99)
Glucose-Capillary: 334 mg/dL — ABNORMAL HIGH (ref 70–99)
Glucose-Capillary: 248 mg/dL — ABNORMAL HIGH (ref 70–99)
Glucose-Capillary: 367 mg/dL — ABNORMAL HIGH (ref 70–99)

## 2019-01-25 LAB — CBC
HCT: 41.8 % (ref 36.0–46.0)
Hemoglobin: 12.5 g/dL (ref 12.0–15.0)
MCH: 25.6 pg — ABNORMAL LOW (ref 26.0–34.0)
MCHC: 29.9 g/dL — ABNORMAL LOW (ref 30.0–36.0)
MCV: 85.7 fL (ref 80.0–100.0)
Platelets: 196 10*3/uL (ref 150–400)
RBC: 4.88 MIL/uL (ref 3.87–5.11)
RDW: 19.9 % — ABNORMAL HIGH (ref 11.5–15.5)
WBC: 14.3 10*3/uL — ABNORMAL HIGH (ref 4.0–10.5)
nRBC: 1.1 % — ABNORMAL HIGH (ref 0.0–0.2)

## 2019-01-25 MED ORDER — CLONAZEPAM 1 MG PO TABS
1.0000 mg | ORAL_TABLET | Freq: Two times a day (BID) | ORAL | Status: DC
  Administered 2019-01-25 – 2019-01-26 (×2): 1 mg
  Filled 2019-01-25 (×2): qty 1

## 2019-01-25 MED ORDER — INSULIN ASPART 100 UNIT/ML ~~LOC~~ SOLN
18.0000 [IU] | SUBCUTANEOUS | Status: DC
  Administered 2019-01-25 – 2019-01-27 (×10): 18 [IU] via SUBCUTANEOUS

## 2019-01-25 MED ORDER — STERILE WATER FOR INJECTION IJ SOLN
INTRAMUSCULAR | Status: AC
  Administered 2019-01-25: 10 mL
  Filled 2019-01-25: qty 10

## 2019-01-25 MED ORDER — FREE WATER
400.0000 mL | Status: DC
  Administered 2019-01-25 – 2019-01-27 (×11): 400 mL

## 2019-01-25 MED ORDER — ACETAZOLAMIDE SODIUM 500 MG IJ SOLR
500.0000 mg | Freq: Once | INTRAMUSCULAR | Status: AC
  Administered 2019-01-25: 500 mg via INTRAVENOUS
  Filled 2019-01-25: qty 500

## 2019-01-25 MED ORDER — CLONAZEPAM 1 MG PO TABS
1.0000 mg | ORAL_TABLET | Freq: Two times a day (BID) | ORAL | Status: DC
  Administered 2019-01-25: 1 mg via ORAL
  Filled 2019-01-25: qty 1

## 2019-01-25 MED ORDER — INSULIN DETEMIR 100 UNIT/ML ~~LOC~~ SOLN
60.0000 [IU] | Freq: Two times a day (BID) | SUBCUTANEOUS | Status: DC
  Administered 2019-01-25: 60 [IU] via SUBCUTANEOUS
  Filled 2019-01-25 (×2): qty 0.6

## 2019-01-25 MED ORDER — INSULIN DETEMIR 100 UNIT/ML ~~LOC~~ SOLN
70.0000 [IU] | Freq: Two times a day (BID) | SUBCUTANEOUS | Status: DC
  Administered 2019-01-25: 70 [IU] via SUBCUTANEOUS
  Filled 2019-01-25 (×2): qty 0.7

## 2019-01-25 NOTE — Progress Notes (Signed)
NAME:  Kaitlin [name], MRN:  [id number], DOB:  Nov 18, 1965, LOS: 71 ADMISSION DATE:  01/12/2019, CONSULTATION DATE:  1/4 REFERRING MD:  Olevia Bowens, CHIEF COMPLAINT:  Dyspnea   Brief History   54 y/o female admitted 1/1 with COVID pneumonia, worsening hypoxemia and intubated on 1/4 requiring intubation.  Past Medical History  Obesity DM2 HTN Chronic diastolic heart failrue Sickle trait Recent thymoma  Significant Hospital Events   1/01 Admitted started on supplemental oxygen, remdesivir, and systemic steroids  1/02 received Actemra 1/03 Oxygen needs improving, on remdesivir and steroids inflammatory markers starting to improve 1/04 Oxygen requirements increasing, had been down to 6 to 8 L via nasal cannula, this required titration up to 15 L.  She was more anxious.  There was not significant change in her chest x-ray.  The patient became more confused in the early evening hours got out of bed without oxygen after this event more obtunded pulse oximetry 82 to 86% on high flow nasal cannula and nonrebreather she was unresponsive to sternal rub she was intubated at [place].  Post intubation ABG still hypercarbic, had significant hypoxia 1/05 Aggressive IV diuresis started, proning protocol with heavy sedation initiated, left IJ triple-lumen catheter placed, echo ordered. 1/06 Newly identified LV dysfunction by echocardiogram with EF 45 to 50%.  Still fairly hypoxic requiring high FiO2/PEEP ongoing supportive care.  Changing propofol to Versed given rising triglycerides.  Did become more hypotensive requiring escalation of vasoactive drips, and up titration of PEEP and FiO2 after being placed back in the supine position.  Remdesivir completed  1/07 Worsening shock requiring further titration of vasoactive drips, change from phenylephrine to norepinephrine and vasopressin, stress dose steroids started raising concern for possible sepsis, diuresis discontinued, antibiotics widened 1/08 Fever curve improved,  cultures all negative thus far, pressor requirements improved excision requirements improved 1/13 Weaned on PSV 10/10 from 0900 until late PM.   1/14 Central line removed  Consults:  PCCM  Procedures:  ETT 1/4 >>  L IJ CVL 1/5 >> 1/14 Arterial R radial 1/6 >> 1/13  Significant Diagnostic Tests:  Echo 1/6 >> Left ventricular ejection fraction, by visual estimation, is 45 to 50%. The left ventricle has mildly decreased function. The left ventricle is not well visualized. There is moderately increased left ventricular hypertrophy. Left ventricular diastolic parameters are indeterminate  Micro Data:  Coronavirus 2 antigen 1/1 >> Positive Blood cultures x2 1/1 >> negative Tracheal aspirate 1/7 >> negative  BCx2 1/7 >> negative  Antimicrobials:  Remdesivir 1/1 >> 1/6  Tocilizumab 1/2 x1  Decadron 1/1 >> to stress dose steroids 1/7 >> stopped 1/11  Vanco 1/4 >> 1/10  Cefepime 1/4 >> 1/7 Meropenem 1/7 >> 1/10  Cipro 1/7 >> 1/10  Interim history/subjective:  Tmax 101.2 Glucose range - 142-300 (improved) Off pressors  Weaned on PSV from 0900 - 2000 I/O - 3L UOP, +583 in 24h PEEP 10, FiO2 50%  Objective   Blood pressure 127/66, pulse (!) 130, temperature (!) 101.2 F (38.4 C), temperature source Axillary, resp. rate 18, height 5\' 9"  (1.753 m), weight (!) 174 kg, last menstrual period 06/17/2012, SpO2 92 %.    Vent Mode: PRVC FiO2 (%):  [50 %] 50 % Set Rate:  [20 bmp] 20 bmp Vt Set:  [400 mL] 400 mL PEEP:  [10 cmH20] 10 cmH20 Pressure Support:  [10 cmH20] 10 cmH20 Plateau Pressure:  [21 [id number] cmH20] 24 cmH20   Intake/Output Summary (Last 24 hours) at 01/25/2019 0843 Last data filed at 01/25/2019  0800 Gross per 24 hour  Intake 3560.31 ml  Output 3045 ml  Net 515.31 ml   Filed Weights   01/21/19 0430 01/24/19 0500 01/25/19 0456  Weight: (!) 176.4 kg (!) 173 kg (!) 174 kg    Examination: General: adult female lying in bed on vent, critically ill appearing  HEENT: MM  pink/moist, ETT, small facial abrasions, healing/improved Neuro: sedate, opens eyes to voice, tracks but does not follow commands CV: s1s2 RRR, ST with stimulation, no m/r/g PULM:  Non-labored on PSV 10/10, lungs bilaterally diminished, coughing GI: soft, bsx4 active  Extremities: warm/dry, no overt edema  Skin: no rashes or lesions  Resolved Hospital Problem list     Assessment & Plan:   Acute Hypoxic Respiratory Failure with ARDS due to COVID Further complicated by element of pulmonary edema and new systemic cardiomyopathy superimposed on underlying obesity hypoventilation syndrome and sleep apnea. Completed remdesivir, toci, steroids.  -low Vt ventilation 4-8cc/kg -adjust set rate to 16, allow pt to set rate / less sedation  -daily SBT/WUA -goal plateau pressure <30, driving pressure [id number] cm H2O -target PaO2 55-65, titrate PEEP/FiO2 per ARDS protocol  -goal CVP <4, diuresis as necessary -VAP prevention measures  -follow intermittent CXR   Sedation Needs while on Mechanical Ventilation  -PAD protocol with versed, fentanyl -reduce sedation dosing as able -continue PT clonazepam  -RASS Goal -1 to -2 -place PIV and discontinue central line   Fever  New 1/14.  No foley.   -discontinue central line once PIV placed -repeat cultures if fever > 101.5  Shock Worsened 1/7, suspect septic but also element of cardiogenic with newly reduced LVEF. Off pressors 1/11, then required low dose.  Suspect sedation related.  -follow off abx, vasopressors  Systolic Cardiomyopathy, newly reduced EF down to 45 to 50% Grade 1 dCHF -tele monitoring  -diuresis as renal function / BP permit, acetazolamide x1  Mild AKI Hypernatremia  -continue free water -acetazolamide 500 mg IV x1 -Trend BMP / urinary output -eplace electrolytes as indicated -Avoid nephrotoxic agents, ensure adequate renal perfusion  Rising LFT's -follow trend  -discontinue crestor 1/14 -avoid hepatotoxic agents  DM2  with hyperglycemia -increase levemir to 60 units BID  -continue 14 units TF coverage Q4 -appreciate DM Coordinator input  Best practice:  Diet: TF  Pain/Anxiety/Delirium protocol (if indicated): as above VAP protocol (if indicated): yes DVT prophylaxi: lovenox 0.5mg /kg per protocol GI prophylaxis: pepcid for stress ulcer prophylaxis Glucose control: SSI Mobility: BR Code Status: full code  Family Communication: Daughter called for update but no answer.  Message left.  Will call back 1/15.  Disposition: ICU   CC Time: 14 minutes    Noe Gens, MSN, NP-C [facility] 01/25/2019, 8:43 AM   Please see [email or web address] for pager details.

## 2019-01-25 NOTE — Progress Notes (Signed)
Attempted to reach out to [place], patient's daughter, to provide update, but unable to reach at this time.

## 2019-01-26 LAB — CBC
HCT: 47.5 % — ABNORMAL HIGH (ref 36.0–46.0)
Hemoglobin: 14.4 g/dL (ref 12.0–15.0)
MCH: 25.6 pg — ABNORMAL LOW (ref 26.0–34.0)
MCHC: 30.3 g/dL (ref 30.0–36.0)
MCV: 84.4 fL (ref 80.0–100.0)
Platelets: 170 10*3/uL (ref 150–400)
RBC: 5.63 MIL/uL — ABNORMAL HIGH (ref 3.87–5.11)
RDW: 20.7 % — ABNORMAL HIGH (ref 11.5–15.5)
WBC: 13.1 10*3/uL — ABNORMAL HIGH (ref 4.0–10.5)
nRBC: 1.4 % — ABNORMAL HIGH (ref 0.0–0.2)

## 2019-01-26 LAB — COMPREHENSIVE METABOLIC PANEL
ALT: 189 U/L — ABNORMAL HIGH (ref 0–44)
AST: 99 U/L — ABNORMAL HIGH (ref 15–41)
Albumin: 3 g/dL — ABNORMAL LOW (ref 3.5–5.0)
Alkaline Phosphatase: 83 U/L (ref 38–126)
Anion gap: 11 (ref 5–15)
BUN: 32 mg/dL — ABNORMAL HIGH (ref 6–20)
CO2: 34 mmol/L — ABNORMAL HIGH (ref 22–32)
Calcium: 9.4 mg/dL (ref 8.9–10.3)
Chloride: 105 mmol/L (ref 98–111)
Creatinine, Ser: 0.86 mg/dL (ref 0.44–1.00)
GFR calc Af Amer: >60 mL/min (ref >60)
GFR calc non Af Amer: >60 mL/min (ref >60)
Glucose, Bld: 297 mg/dL — ABNORMAL HIGH (ref 70–99)
Potassium: 4.1 mmol/L (ref 3.5–5.1)
Sodium: 150 mmol/L — ABNORMAL HIGH (ref 135–145)
Total Bilirubin: 0.9 mg/dL (ref 0.3–1.2)
Total Protein: 6 g/dL — ABNORMAL LOW (ref 6.5–8.1)

## 2019-01-26 LAB — GLUCOSE, CAPILLARY
Glucose-Capillary: 101 mg/dL — ABNORMAL HIGH (ref 70–99)
Glucose-Capillary: 135 mg/dL — ABNORMAL HIGH (ref 70–99)
Glucose-Capillary: 207 mg/dL — ABNORMAL HIGH (ref 70–99)
Glucose-Capillary: 211 mg/dL — ABNORMAL HIGH (ref 70–99)
Glucose-Capillary: 365 mg/dL — ABNORMAL HIGH (ref 70–99)
Glucose-Capillary: 379 mg/dL — ABNORMAL HIGH (ref 70–99)

## 2019-01-26 MED ORDER — METOCLOPRAMIDE HCL 5 MG/ML IJ SOLN
5.0000 mg | Freq: Four times a day (QID) | INTRAMUSCULAR | Status: AC
  Administered 2019-01-26 – 2019-01-27 (×3): 5 mg via INTRAVENOUS
  Filled 2019-01-26 (×4): qty 1

## 2019-01-26 MED ORDER — DEXMEDETOMIDINE HCL IN NACL 400 MCG/100ML IV SOLN
0.0000 ug/kg/h | INTRAVENOUS | Status: AC
  Administered 2019-01-26 (×2): 0.4 ug/kg/h via INTRAVENOUS
  Administered 2019-01-28: 0.6 ug/kg/h via INTRAVENOUS
  Administered 2019-01-28: 0.3 ug/kg/h via INTRAVENOUS
  Administered 2019-01-28: 0.8 ug/kg/h via INTRAVENOUS
  Administered 2019-01-29: 0.3 ug/kg/h via INTRAVENOUS
  Administered 2019-01-29: 0.2 ug/kg/h via INTRAVENOUS
  Filled 2019-01-26 (×5): qty 100
  Filled 2019-01-26: qty 200

## 2019-01-26 MED ORDER — INSULIN DETEMIR 100 UNIT/ML ~~LOC~~ SOLN
75.0000 [IU] | Freq: Two times a day (BID) | SUBCUTANEOUS | Status: DC
  Administered 2019-01-26: 75 [IU] via SUBCUTANEOUS
  Filled 2019-01-26 (×2): qty 0.75

## 2019-01-26 MED ORDER — INSULIN DETEMIR 100 UNIT/ML ~~LOC~~ SOLN
70.0000 [IU] | Freq: Two times a day (BID) | SUBCUTANEOUS | Status: DC
  Administered 2019-01-26 – 2019-01-28 (×4): 70 [IU] via SUBCUTANEOUS
  Filled 2019-01-26 (×5): qty 0.7

## 2019-01-26 NOTE — Progress Notes (Signed)
Called patient's daughter, [name]. Gave update. She requested a video call sometime between 2000-2100 tonight so that she can see her mother. I will pass this along to the night shift RN.

## 2019-01-26 NOTE — Plan of Care (Signed)

## 2019-01-26 NOTE — Progress Notes (Signed)
NAME:  Kaitlin [name], MRN:  [id number], DOB:  1965-04-11, LOS: 46 ADMISSION DATE:  01/12/2019, CONSULTATION DATE:  1/4 REFERRING MD:  Olevia Bowens, CHIEF COMPLAINT:  Dyspnea   Brief History   54 y/o female admitted 1/1 with COVID pneumonia, worsening hypoxemia and intubated on 1/4 requiring intubation.  Past Medical History  Obesity DM2 HTN Chronic diastolic heart failrue Sickle trait Recent thymoma  Significant Hospital Events   1/01 Admitted started on supplemental oxygen, remdesivir, and systemic steroids  1/02 received Actemra 1/03 Oxygen needs improving, on remdesivir and steroids inflammatory markers starting to improve 1/04 Oxygen requirements increasing, had been down to 6 to 8 L via nasal cannula, this required titration up to 15 L.  She was more anxious.  There was not significant change in her chest x-ray.  The patient became more confused in the early evening hours got out of bed without oxygen after this event more obtunded pulse oximetry 82 to 86% on high flow nasal cannula and nonrebreather she was unresponsive to sternal rub she was intubated at [place].  Post intubation ABG still hypercarbic, had significant hypoxia 1/05 Aggressive IV diuresis started, proning protocol with heavy sedation initiated, left IJ triple-lumen catheter placed, echo ordered. 1/06 Newly identified LV dysfunction by echocardiogram with EF 45 to 50%.  Still fairly hypoxic requiring high FiO2/PEEP ongoing supportive care.  Changing propofol to Versed given rising triglycerides.  Did become more hypotensive requiring escalation of vasoactive drips, and up titration of PEEP and FiO2 after being placed back in the supine position.  Remdesivir completed  1/07 Worsening shock requiring further titration of vasoactive drips, change from phenylephrine to norepinephrine and vasopressin, stress dose steroids started raising concern for possible sepsis, diuresis discontinued, antibiotics widened 1/08 Fever curve improved,  cultures all negative thus far, pressor requirements improved excision requirements improved 1/13 Weaned on PSV 10/10 from 0900 until late PM.   1/14 Central line removed, weaned on PSV, remains sedate  Consults:  PCCM  Procedures:  ETT 1/4 >>  L IJ CVL 1/5 >> 1/14 Arterial R radial 1/6 >> 1/13  Significant Diagnostic Tests:  Echo 1/6 >> Left ventricular ejection fraction, by visual estimation, is 45 to 50%. The left ventricle has mildly decreased function. The left ventricle is not well visualized. There is moderately increased left ventricular hypertrophy. Left ventricular diastolic parameters are indeterminate  Micro Data:  Coronavirus 2 antigen 1/1 >> Positive Blood cultures x2 1/1 >> negative Tracheal aspirate 1/7 >> negative  BCx2 1/7 >> negative  Antimicrobials:  Remdesivir 1/1 >> 1/6  Tocilizumab 1/2 x1  Decadron 1/1 >> to stress dose steroids 1/7 >> stopped 1/11  Vanco 1/4 >> 1/10  Cefepime 1/4 >> 1/7 Meropenem 1/7 >> 1/10  Cipro 1/7 >> 1/10  Interim history/subjective:  Tmax 100.5 Glucose range - 140-300 PSV wean this am I/O - 1L UOP, +2L in 24h PEEP 10, FiO2 50%  RN reports vomiting overnight and this am.  TF on hold.   Objective   Blood pressure (!) 151/90, pulse (!) 115, temperature 99.7 F (37.6 C), temperature source Oral, resp. rate 19, height 5\' 9"  (1.753 m), weight (!) 176 kg, last menstrual period 06/17/2012, SpO2 92 %.    Vent Mode: PSV;CPAP FiO2 (%):  [50 %] 50 % Set Rate:  [20 bmp] 20 bmp Vt Set:  [400 mL] 400 mL PEEP:  [10 cmH20] 10 cmH20 Pressure Support:  [10 cmH20] 10 cmH20 Plateau Pressure:  [15 cmH20-23 cmH20] 23 cmH20   Intake/Output Summary (  Last 24 hours) at 01/26/2019 0848 Last data filed at 01/26/2019 0800 Gross per 24 hour  Intake 2972.19 ml  Output 1450 ml  Net 1522.19 ml   Filed Weights   01/24/19 0500 01/25/19 0456 01/26/19 0500  Weight: (!) 173 kg (!) 174 kg (!) 176 kg    Examination: General: adult female lying in bed  in NAD, critically ill appearing HEENT: MM pink/moist, ETT, pupils 4mm/reactive  Neuro: Sedate, opens eyes to voice, moved lower extremities with stimulation  CV: s1s2 RRR, no m/r/g PULM:  Non-labored on PSV, lungs bilaterally diminished GI: soft, bsx4 active  Extremities: warm/dry, difficult to assess for edema due to body habitus but no overt pitting in LE  Skin: no rashes or lesions  Resolved Hospital Problem list   Shock - mixed cardiogenic, septic   Assessment & Plan:   Acute Hypoxic Respiratory Failure with ARDS due to COVID Further complicated by element of pulmonary edema and new systemic cardiomyopathy superimposed on underlying obesity hypoventilation syndrome and sleep apnea. Completed remdesivir, toci, steroids.  -low Vt ventilation 4-8cc/kg -goal plateau pressure <30, driving pressure [id number] cm H2O -target PaO2 55-65, titrate PEEP/FiO2 per ARDS protocol  -continue daily PSV, WUA/SBT as tolerated  -VAP prevention measures  -follow intermittent CXR   Sedation Needs while on Mechanical Ventilation  -PAD protocol with precedex -Fentanyl as needed -reduce sedation as able  -discontinue clonazepam -RASS goal -1 to -2  Fever  New 1/13.  No foley. Lines removed -monitor fever curve -repeat cultures if [id number]  Systolic Cardiomyopathy, newly reduced EF down to 45 to 50% Grade 1 dCHF -tele monitoring  -diuresis as renal function / BP permit  Mild AKI Hypernatremia  -continue free water  -Trend BMP / urinary output -Replace electrolytes as indicated -Avoid nephrotoxic agents, ensure adequate renal perfusion  Vomiting -hold TF until noon, restart at 1/2 prior dose and slowly titrate up -reglan 5mg  IV Q6 x3 doses  Rising LFT's -trend LFT's  -follow off crestor, stopped 1/14  -avoid hepatotoxic agents  DM2 with Hyperglycemia -levemir 70 units BID, hold increase with vomiting, TF on hold -TF coverage, 18 units Q4  Best practice:  Diet: TF  Pain/Anxiety/Delirium  protocol (if indicated): as above VAP protocol (if indicated): yes DVT prophylaxi: lovenox 0.5mg /kg per protocol GI prophylaxis: pepcid for stress ulcer prophylaxis Glucose control: SSI Mobility: BR Code Status: full code  Family Communication: [name] (daughter) updated on status 1/15 Disposition: ICU   CC Time: 72 minutes    Noe Gens, MSN, NP-C [facility] 01/26/2019, 8:48 AM   Please see [email or web address] for pager details.

## 2019-01-27 ENCOUNTER — Inpatient Hospital Stay (HOSPITAL_COMMUNITY): Payer: Medicaid Other

## 2019-01-27 DIAGNOSIS — R579 Shock, unspecified: Secondary | ICD-10-CM

## 2019-01-27 LAB — CBC
HCT: 41.5 % (ref 36.0–46.0)
Hemoglobin: 13.1 g/dL (ref 12.0–15.0)
MCH: 26.3 pg (ref 26.0–34.0)
MCHC: 31.6 g/dL (ref 30.0–36.0)
MCV: 83.2 fL (ref 80.0–100.0)
Platelets: 159 10*3/uL (ref 150–400)
RBC: 4.99 MIL/uL (ref 3.87–5.11)
RDW: 21.2 % — ABNORMAL HIGH (ref 11.5–15.5)
WBC: 17.6 10*3/uL — ABNORMAL HIGH (ref 4.0–10.5)
nRBC: 0.3 % — ABNORMAL HIGH (ref 0.0–0.2)

## 2019-01-27 LAB — COMPREHENSIVE METABOLIC PANEL
ALT: 115 U/L — ABNORMAL HIGH (ref 0–44)
AST: 61 U/L — ABNORMAL HIGH (ref 15–41)
Albumin: 2.7 g/dL — ABNORMAL LOW (ref 3.5–5.0)
Alkaline Phosphatase: 70 U/L (ref 38–126)
Anion gap: 7 (ref 5–15)
BUN: 31 mg/dL — ABNORMAL HIGH (ref 6–20)
CO2: 34 mmol/L — ABNORMAL HIGH (ref 22–32)
Calcium: 8.8 mg/dL — ABNORMAL LOW (ref 8.9–10.3)
Chloride: 104 mmol/L (ref 98–111)
Creatinine, Ser: 0.82 mg/dL (ref 0.44–1.00)
GFR calc Af Amer: >60 mL/min (ref >60)
GFR calc non Af Amer: >60 mL/min (ref >60)
Glucose, Bld: 248 mg/dL — ABNORMAL HIGH (ref 70–99)
Potassium: 4 mmol/L (ref 3.5–5.1)
Sodium: 145 mmol/L (ref 135–145)
Total Bilirubin: 0.8 mg/dL (ref 0.3–1.2)
Total Protein: 5.4 g/dL — ABNORMAL LOW (ref 6.5–8.1)

## 2019-01-27 LAB — GLUCOSE, CAPILLARY
Glucose-Capillary: 241 mg/dL — ABNORMAL HIGH (ref 70–99)
Glucose-Capillary: 221 mg/dL — ABNORMAL HIGH (ref 70–99)
Glucose-Capillary: 207 mg/dL — ABNORMAL HIGH (ref 70–99)
Glucose-Capillary: 180 mg/dL — ABNORMAL HIGH (ref 70–99)
Glucose-Capillary: 218 mg/dL — ABNORMAL HIGH (ref 70–99)
Glucose-Capillary: 200 mg/dL — ABNORMAL HIGH (ref 70–99)

## 2019-01-27 MED ORDER — PHENYLEPHRINE HCL-NACL 10-0.9 MG/250ML-% IV SOLN
INTRAVENOUS | Status: AC
  Filled 2019-01-27: qty 250

## 2019-01-27 MED ORDER — FREE WATER
300.0000 mL | Status: DC
  Administered 2019-01-27 – 2019-02-01 (×29): 300 mL

## 2019-01-27 MED ORDER — PHENYLEPHRINE HCL-NACL 10-0.9 MG/250ML-% IV SOLN
0.0000 ug/min | INTRAVENOUS | Status: DC
  Administered 2019-01-27 (×2): 20 ug/min via INTRAVENOUS

## 2019-01-27 MED ORDER — INSULIN ASPART 100 UNIT/ML ~~LOC~~ SOLN
20.0000 [IU] | SUBCUTANEOUS | Status: DC
  Administered 2019-01-27 – 2019-01-28 (×6): 20 [IU] via SUBCUTANEOUS

## 2019-01-27 MED ORDER — MELATONIN 5 MG PO TABS
5.0000 mg | ORAL_TABLET | Freq: Every evening | ORAL | Status: DC | PRN
  Administered 2019-01-27: 5 mg
  Filled 2019-01-27 (×2): qty 1

## 2019-01-27 MED ORDER — FUROSEMIDE 10 MG/ML IJ SOLN
40.0000 mg | Freq: Once | INTRAMUSCULAR | Status: AC
  Administered 2019-01-27: 40 mg via INTRAVENOUS
  Filled 2019-01-27: qty 4

## 2019-01-27 NOTE — Progress Notes (Signed)
NAME:  Kaitlin [name], MRN:  [id number], DOB:  [birth year], LOS: 40 ADMISSION DATE:  01/12/2019, CONSULTATION DATE:  1/4 REFERRING MD:  Olevia Bowens, CHIEF COMPLAINT:  Dyspnea   Brief History   54 y/o female admitted 1/1 with COVID pneumonia, worsening hypoxemia and intubated on 1/4 requiring intubation.  Past Medical History  Obesity DM2 HTN Chronic diastolic heart failrue Sickle trait Recent thymoma  Significant Hospital Events   1/01 Admitted started on supplemental oxygen, remdesivir, and systemic steroids  1/02 received Actemra 1/03 Oxygen needs improving, on remdesivir and steroids inflammatory markers starting to improve 1/04 Oxygen requirements increasing, had been down to 6 to 8 L via nasal cannula, this required titration up to 15 L.  She was more anxious.  There was not significant change in her chest x-ray.  The patient became more confused in the early evening hours got out of bed without oxygen after this event more obtunded pulse oximetry 82 to 86% on high flow nasal cannula and nonrebreather she was unresponsive to sternal rub she was intubated at [place].  Post intubation ABG still hypercarbic, had significant hypoxia 1/05 Aggressive IV diuresis started, proning protocol with heavy sedation initiated, left IJ triple-lumen catheter placed, echo ordered. 1/06 Newly identified LV dysfunction by echocardiogram with EF 45 to 50%.  Still fairly hypoxic requiring high FiO2/PEEP ongoing supportive care.  Changing propofol to Versed given rising triglycerides.  Did become more hypotensive requiring escalation of vasoactive drips, and up titration of PEEP and FiO2 after being placed back in the supine position.  Remdesivir completed  1/07 Worsening shock requiring further titration of vasoactive drips, change from phenylephrine to norepinephrine and vasopressin, stress dose steroids started raising concern for possible sepsis, diuresis discontinued, antibiotics widened 1/08 Fever curve improved,  cultures all negative thus far, pressor requirements improved excision requirements improved 1/13 Weaned on PSV 10/10 from 0900 until late PM.   1/14 Central line removed, weaned on PSV, remains sedate  Consults:  PCCM  Procedures:  ETT 1/4 >>  L IJ CVL 1/5 >> 1/14 Arterial R radial 1/6 >> 1/13  Significant Diagnostic Tests:  Echo 1/6 >> Left ventricular ejection fraction, by visual estimation, is 45 to 50%. The left ventricle has mildly decreased function. The left ventricle is not well visualized. There is moderately increased left ventricular hypertrophy. Left ventricular diastolic parameters are indeterminate  Micro Data:  Coronavirus 2 antigen 1/1 >> Positive Blood cultures x2 1/1 >> negative Tracheal aspirate 1/7 >> negative  BCx2 1/7 >> negative  Antimicrobials:  Remdesivir 1/1 >> 1/6  Tocilizumab 1/2 x1  Decadron 1/1 >> to stress dose steroids 1/7 >> stopped 1/11  Vanco 1/4 >> 1/10  Cefepime 1/4 >> 1/7 Meropenem 1/7 >> 1/10  Cipro 1/7 >> 1/10  Interim history/subjective:   Remains critically ill, intubated Weaning on pressure support 5/PEEP of 10 with tidal volume 350 range Defervesced Wean off Neo-Synephrine  Objective   Blood pressure 128/79, pulse (!) 115, temperature 98.9 F (37.2 C), temperature source Axillary, resp. rate (!) 30, height 5\' 9"  (1.753 m), weight (!) 176 kg, last menstrual period 06/17/2012, SpO2 95 %.    Vent Mode: PRVC FiO2 (%):  [50 %] 50 % Set Rate:  [20 bmp] 20 bmp Vt Set:  [400 mL] 400 mL PEEP:  [10 cmH20] 10 cmH20 Pressure Support:  [10 cmH20] 10 cmH20 Plateau Pressure:  [21 cmH20] 21 cmH20   Intake/Output Summary (Last 24 hours) at 01/27/2019 1216 Last data filed at 01/27/2019 1100 Gross per  24 hour  Intake 5204.05 ml  Output 1450 ml  Net 3754.05 ml   Filed Weights   01/24/19 0500 01/25/19 0456 01/26/19 0500  Weight: (!) 173 kg (!) 174 kg (!) 176 kg    Examination: General: Obese woman lying in bed in NAD, critically ill  appearing HEENT: MM pink/moist, ETT, pupils 71mm/reactive  Neuro: Awake, RASS +1, follows one-step commands, but very weak in both arms and legs with only flicker movement CV: 123456 RRR, no m/r/g PULM: Mild accessory muscle use on PSV, lungs bilaterally diminished GI: soft, bsx4 active  Extremities: warm/dry, 1+ edema  Skin: no rashes or lesions  Chest x-ray from 1/16 personally reviewed which shows stable bilateral airspace disease. Labs show stable renal function, normal electrolytes, decreased sodium, mild increase in leukocytosis  Resolved Hospital Problem list   Shock - mixed cardiogenic, septic   Assessment & Plan:   ARDS due to COVID - complicated by element of pulmonary edema and new systemic cardiomyopathy superimposed on underlying obesity hypoventilation syndrome and sleep apnea. Completed remdesivir, toci, steroids.  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  No foley. Lines removed , now defervesced -monitor, low threshold to add antibiotics given leukocytosis however no source apparent  Systolic Cardiomyopathy, newly reduced EF down to 45 to 50% Grade 1 dCHF -tele monitoring  -diuresis as renal function / BP permit  AKI -resolved Hypernatremia , improved -continue free water  -Trend BMP / urinary output -Replace electrolytes as indicated -Avoid nephrotoxic agents, ensure adequate renal perfusion  Vomiting -hold TF until noon, restart at 1/2 prior dose and slowly titrate up -reglan 5mg  IV Q6 x3 doses  Elevated LFT's - LFT's trending back down - crestor, stopped 1/14  -avoid  hepatotoxic agents  DM2 with Hyperglycemia -levemir 70 units BID, TF on hold -TF coverage, 18 units Q4  Best practice:  Diet: TF  Pain/Anxiety/Delirium protocol (if indicated): as above VAP protocol (if indicated): yes DVT prophylaxi: lovenox 0.5mg /kg per protocol GI prophylaxis: pepcid  Glucose control: SSI Mobility: BR Code Status: full code  Family Communication: [name] (daughter) updated on status 1/15 Disposition: ICU   The patient is critically ill with multiple organ systems failure and requires high complexity decision making for assessment and support, frequent evaluation and titration of therapies, application of advanced monitoring technologies and extensive interpretation of multiple databases. Critical Care Time devoted to patient care services described in this note independent of APP/resident  time is 35 minutes.    Kara Mead MD. Shade Flood. [facility]  If no response to pager , please call [phone]   01/27/2019

## 2019-01-27 NOTE — Progress Notes (Signed)
Inpatient Diabetes Program Recommendations  AACE/ADA: New Consensus Statement on Inpatient Glycemic Control (2015)  Target Ranges:  Prepandial:   less than 140 mg/dL      Peak postprandial:   less than 180 mg/dL (1-2 hours)      Critically ill patients:  140 - 180 mg/dL   Lab Results  Component Value Date   GLUCAP 221 (H) 01/27/2019   HGBA1C 7.1 (H) 01/13/2019    Review of Glycemic Control  Blood sugars this am: 241, 248, 221. Insulin titration needed.  Inpatient Diabetes Program Recommendations:     Increase Levemir to 76 units bid Increase Novolog to 22 units tidwc  Continue to follow closely.  Thank you. Lorenda Peck, RD, LDN, CDE Inpatient Diabetes Coordinator [phone]

## 2019-01-27 NOTE — Progress Notes (Signed)
[facility] Progress Note Patient Name: Kaitlin [name] DOB: 24-Sep-1965 MRN: [id number]   Date of Service  01/27/2019  HPI/Events of Note  Hypotension  eICU Interventions  Phenylephrine infusion ordered        Frederik Pear 01/27/2019, 2:11 AM

## 2019-01-28 LAB — CBC
HCT: 42 % (ref 36.0–46.0)
Hemoglobin: 13.4 g/dL (ref 12.0–15.0)
MCH: 26.5 pg (ref 26.0–34.0)
MCHC: 31.9 g/dL (ref 30.0–36.0)
MCV: 83 fL (ref 80.0–100.0)
Platelets: 169 10*3/uL (ref 150–400)
RBC: 5.06 MIL/uL (ref 3.87–5.11)
RDW: 22 % — ABNORMAL HIGH (ref 11.5–15.5)
WBC: 18.4 10*3/uL — ABNORMAL HIGH (ref 4.0–10.5)
nRBC: 0.2 % (ref 0.0–0.2)

## 2019-01-28 LAB — COMPREHENSIVE METABOLIC PANEL
ALT: 107 U/L — ABNORMAL HIGH (ref 0–44)
AST: 76 U/L — ABNORMAL HIGH (ref 15–41)
Albumin: 2.8 g/dL — ABNORMAL LOW (ref 3.5–5.0)
Alkaline Phosphatase: 76 U/L (ref 38–126)
Anion gap: 11 (ref 5–15)
BUN: 29 mg/dL — ABNORMAL HIGH (ref 6–20)
CO2: 35 mmol/L — ABNORMAL HIGH (ref 22–32)
Calcium: 8.9 mg/dL (ref 8.9–10.3)
Chloride: 104 mmol/L (ref 98–111)
Creatinine, Ser: 0.79 mg/dL (ref 0.44–1.00)
GFR calc Af Amer: >60 mL/min (ref >60)
GFR calc non Af Amer: >60 mL/min (ref >60)
Glucose, Bld: 257 mg/dL — ABNORMAL HIGH (ref 70–99)
Potassium: 3.2 mmol/L — ABNORMAL LOW (ref 3.5–5.1)
Sodium: 150 mmol/L — ABNORMAL HIGH (ref 135–145)
Total Bilirubin: 0.8 mg/dL (ref 0.3–1.2)
Total Protein: 5.4 g/dL — ABNORMAL LOW (ref 6.5–8.1)

## 2019-01-28 LAB — GLUCOSE, CAPILLARY
Glucose-Capillary: 240 mg/dL — ABNORMAL HIGH (ref 70–99)
Glucose-Capillary: 308 mg/dL — ABNORMAL HIGH (ref 70–99)
Glucose-Capillary: 277 mg/dL — ABNORMAL HIGH (ref 70–99)
Glucose-Capillary: 248 mg/dL — ABNORMAL HIGH (ref 70–99)
Glucose-Capillary: 227 mg/dL — ABNORMAL HIGH (ref 70–99)
Glucose-Capillary: 275 mg/dL — ABNORMAL HIGH (ref 70–99)

## 2019-01-28 MED ORDER — INSULIN ASPART 100 UNIT/ML ~~LOC~~ SOLN
22.0000 [IU] | SUBCUTANEOUS | Status: DC
  Administered 2019-01-28 – 2019-02-01 (×22): 22 [IU] via SUBCUTANEOUS

## 2019-01-28 MED ORDER — FENTANYL CITRATE (PF) 100 MCG/2ML IJ SOLN
25.0000 ug | INTRAMUSCULAR | Status: DC | PRN
  Administered 2019-01-30 (×6): 100 ug via INTRAVENOUS
  Filled 2019-01-28 (×6): qty 2

## 2019-01-28 MED ORDER — INSULIN DETEMIR 100 UNIT/ML ~~LOC~~ SOLN
75.0000 [IU] | Freq: Two times a day (BID) | SUBCUTANEOUS | Status: DC
  Administered 2019-01-28 – 2019-02-01 (×8): 75 [IU] via SUBCUTANEOUS
  Filled 2019-01-28 (×8): qty 0.75

## 2019-01-28 MED ORDER — POTASSIUM CHLORIDE 20 MEQ/15ML (10%) PO SOLN
40.0000 meq | Freq: Once | ORAL | Status: AC
  Administered 2019-01-28: 40 meq
  Filled 2019-01-28: qty 30

## 2019-01-28 NOTE — Plan of Care (Signed)
Patient very weak. Did ROM exercises to BUE, BLE, and Neck multiple times throughout shift. Tolerated well. Had bed in sitting position and head held up by pillows from 0800-1800 to help strengthen core and neck.    Problem: Education: Goal: Knowledge of General Education information will improve Description: Including pain rating scale, medication(s)/side effects and non-pharmacologic comfort measures Outcome: Progressing   Problem: Health Behavior/Discharge Planning: Goal: Ability to manage health-related needs will improve Outcome: Progressing   Problem: Clinical Measurements: Goal: Ability to maintain clinical measurements within normal limits will improve Outcome: Progressing Goal: Will remain free from infection Outcome: Progressing Goal: Diagnostic test results will improve Outcome: Progressing Goal: Respiratory complications will improve Outcome: Progressing Goal: Cardiovascular complication will be avoided Outcome: Progressing   Problem: Activity: Goal: Risk for activity intolerance will decrease Outcome: Progressing   Problem: Nutrition: Goal: Adequate nutrition will be maintained Outcome: Progressing   Problem: Coping: Goal: Level of anxiety will decrease Outcome: Progressing   Problem: Elimination: Goal: Will not experience complications related to bowel motility Outcome: Progressing Goal: Will not experience complications related to urinary retention Outcome: Progressing   Problem: Pain Managment: Goal: General experience of comfort will improve Outcome: Progressing   Problem: Safety: Goal: Ability to remain free from injury will improve Outcome: Progressing   Problem: Skin Integrity: Goal: Risk for impaired skin integrity will decrease Outcome: Progressing

## 2019-01-28 NOTE — Progress Notes (Addendum)
NAME:  Kaitlin [name], MRN:  [id number], DOB:  [birth year], LOS: 19 ADMISSION DATE:  01/12/2019, CONSULTATION DATE:  1/4 REFERRING MD:  Olevia Bowens, CHIEF COMPLAINT:  Dyspnea   Brief History   54 y/o female admitted 1/1 with COVID pneumonia, worsening hypoxemia and intubated on 1/4  Past Medical History  Obesity DM2 HTN Chronic diastolic heart failrue Sickle trait Recent thymoma  Significant Hospital Events   1/01 Admitted started on supplemental oxygen, remdesivir, and systemic steroids  1/02 received Actemra 1/03 Oxygen needs improving, on remdesivir and steroids inflammatory markers starting to improve 1/04 Oxygen requirements increasing, had been down to 6 to 8 L via nasal cannula, this required titration up to 15 L.  She was more anxious.  There was not significant change in her chest x-ray.  The patient became more confused in the early evening hours got out of bed without oxygen after this event more obtunded pulse oximetry 82 to 86% on high flow nasal cannula and nonrebreather she was unresponsive to sternal rub she was intubated at [place].  Post intubation ABG still hypercarbic, had significant hypoxia 1/05 Aggressive IV diuresis started, proning protocol with heavy sedation initiated, left IJ triple-lumen catheter placed, echo ordered. 1/06 Newly identified LV dysfunction by echocardiogram with EF 45 to 50%.  Still fairly hypoxic requiring high FiO2/PEEP ongoing supportive care.  Changing propofol to Versed given rising triglycerides.  Did become more hypotensive requiring escalation of vasoactive drips, and up titration of PEEP and FiO2 after being placed back in the supine position.  Remdesivir completed  1/07 Worsening shock requiring further titration of vasoactive drips, change from phenylephrine to norepinephrine and vasopressin, stress dose steroids started raising concern for possible sepsis, diuresis discontinued, antibiotics widened 1/08 Fever curve improved, cultures all negative  thus far, pressor requirements improved excision requirements improved 1/13 Weaned on PSV 10/10 from 0900 until late PM.   1/14 Central line removed, weaned on PSV, remains sedate 1/16 weans on PSV, tachypneic and low tidal volumes, flaccid extremities  Consults:  PCCM  Procedures:  ETT 1/4 >>  L IJ CVL 1/5 >> 1/14 Arterial R radial 1/6 >> 1/13  Significant Diagnostic Tests:  Echo 1/6 >> Left ventricular ejection fraction, by visual estimation, is 45 to 50%. The left ventricle has mildly decreased function. The left ventricle is not well visualized. There is moderately increased left ventricular hypertrophy. Left ventricular diastolic parameters are indeterminate  Micro Data:  Coronavirus 2 antigen 1/1 >> Positive Blood cultures x2 1/1 >> negative Tracheal aspirate 1/7 >> negative  BCx2 1/7 >> negative  Antimicrobials:  Remdesivir 1/1 >> 1/6  Tocilizumab 1/2 x1  Decadron 1/1 >> to stress dose steroids 1/7 >> stopped 1/11  Vanco 1/4 >> 1/10  Cefepime 1/4 >> 1/7 Meropenem 1/7 >> 1/10  Cipro 1/7 >> 1/10  Interim history/subjective:   Off pressors Weaning on pressure support Critically ill, intubated Afebrile Good urine output with Lasix  Objective   Blood pressure 106/66, pulse 96, temperature 99.6 F (37.6 C), temperature source Axillary, resp. rate (!) 28, height 5\' 9"  (1.753 m), weight (!) 176 kg, last menstrual period 06/17/2012, SpO2 97 %.    Vent Mode: PRVC FiO2 (%):  [40 %-50 %] 40 % Set Rate:  [20 bmp] 20 bmp Vt Set:  [400 mL] 400 mL PEEP:  [10 cmH20] 10 cmH20 Pressure Support:  [5 cmH20] 5 cmH20 Plateau Pressure:  [20 cmH20-23 cmH20] 23 cmH20   Intake/Output Summary (Last 24 hours) at 01/28/2019 1023 Last data filed  at 01/28/2019 1000 Gross per 24 hour  Intake 2506.94 ml  Output 1700 ml  Net 806.94 ml   Filed Weights   01/24/19 0500 01/25/19 0456 01/26/19 0500  Weight: (!) 173 kg (!) 174 kg (!) 176 kg    Examination: General: Obese woman supine in  bed in NAD, chronically ill appearing HEENT: MM pink/moist, ETT, pupils 31mm/reactive  Neuro: Awake, RASS +1, follows one-step commands, flaccid extremities with only flicker movement CV: 123456 RRR, no m/r/g PULM: Mild accessory muscle use on PSV, decreased breath sounds bilateral, no rhonchi GI: soft, bsx4 active  Extremities: warm/dry, 1+ edema  Skin: no rashes or lesions  Chest x-ray from 1/16 personally reviewed which shows stable bilateral airspace disease.  Labs show increased sodium, hypokalemia, mild increase in leukocytosis, stable thrombocytopenia   Resolved Hospital Problem list   Shock - mixed cardiogenic, septic   Assessment & Plan:   ARDS due to COVID -likely has OSA/OHS - complicated by element of pulmonary edema and new systemic cardiomyopathy  Completed remdesivir, toci, steroids.  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 Lines removed -monitor, low threshold to add antibiotics given leukocytosis however no source apparent  Systolic Cardiomyopathy, newly reduced EF down to 45 to 50% Grade 1 dCHF -tele monitoring  -diuresis as renal function / BP permit  AKI -resolved Hypernatremia -continue free water 300 every 4 -Trend BMP / urinary output -Replace hypokalemia -Avoid nephrotoxic agents   Elevated LFT's - LFT's trending back down - crestor, stopped 1/14  -avoid hepatotoxic agents  DM2 with Hyperglycemia -levemir 70 units BID -TF coverage, 18 units Q4   Summary-nearing  2 weeks of mechanical ventilation, has made progress with weaning however severe critical illness myopathy main barrier to extubation now Daughter agreeable to tracheostomy if she fails trial of extubation  Best practice:  Diet: TF  Pain/Anxiety/Delirium protocol (if indicated): precedex VAP protocol (if indicated): yes DVT prophylaxi: lovenox 0.5mg /kg per protocol GI prophylaxis: pepcid  Glucose control: SSI Mobility: BR Code Status: full code  Family Communication: [name] (daughter) 1/17 Disposition: ICU   The patient is critically ill with multiple organ systems failure and requires high complexity decision making for assessment and support, frequent evaluation and titration of therapies, application of advanced monitoring technologies and extensive interpretation of multiple databases. Critical Care Time devoted to patient care services described in this note independent of APP/resident  time is 35 minutes.     Kara Mead MD. Shade Flood. [facility]  If no response to pager , please call [phone]   01/28/2019

## 2019-01-29 LAB — GLUCOSE, CAPILLARY
Glucose-Capillary: 175 mg/dL — ABNORMAL HIGH (ref 70–99)
Glucose-Capillary: 160 mg/dL — ABNORMAL HIGH (ref 70–99)
Glucose-Capillary: 183 mg/dL — ABNORMAL HIGH (ref 70–99)
Glucose-Capillary: 239 mg/dL — ABNORMAL HIGH (ref 70–99)
Glucose-Capillary: 185 mg/dL — ABNORMAL HIGH (ref 70–99)
Glucose-Capillary: 246 mg/dL — ABNORMAL HIGH (ref 70–99)

## 2019-01-29 LAB — COMPREHENSIVE METABOLIC PANEL
ALT: 83 U/L — ABNORMAL HIGH (ref 0–44)
AST: 53 U/L — ABNORMAL HIGH (ref 15–41)
Albumin: 2.6 g/dL — ABNORMAL LOW (ref 3.5–5.0)
Alkaline Phosphatase: 60 U/L (ref 38–126)
Anion gap: 7 (ref 5–15)
BUN: 33 mg/dL — ABNORMAL HIGH (ref 6–20)
CO2: 34 mmol/L — ABNORMAL HIGH (ref 22–32)
Calcium: 8.5 mg/dL — ABNORMAL LOW (ref 8.9–10.3)
Chloride: 109 mmol/L (ref 98–111)
Creatinine, Ser: 0.91 mg/dL (ref 0.44–1.00)
GFR calc Af Amer: >60 mL/min (ref >60)
GFR calc non Af Amer: >60 mL/min (ref >60)
Glucose, Bld: 192 mg/dL — ABNORMAL HIGH (ref 70–99)
Potassium: 3.5 mmol/L (ref 3.5–5.1)
Sodium: 150 mmol/L — ABNORMAL HIGH (ref 135–145)
Total Bilirubin: 0.7 mg/dL (ref 0.3–1.2)
Total Protein: 5.3 g/dL — ABNORMAL LOW (ref 6.5–8.1)

## 2019-01-29 LAB — MAGNESIUM: Magnesium: 2.7 mg/dL — ABNORMAL HIGH (ref 1.7–2.4)

## 2019-01-29 LAB — CBC
HCT: 38.2 % (ref 36.0–46.0)
Hemoglobin: 11.8 g/dL — ABNORMAL LOW (ref 12.0–15.0)
MCH: 26.3 pg (ref 26.0–34.0)
MCHC: 30.9 g/dL (ref 30.0–36.0)
MCV: 85.3 fL (ref 80.0–100.0)
Platelets: 136 10*3/uL — ABNORMAL LOW (ref 150–400)
RBC: 4.48 MIL/uL (ref 3.87–5.11)
RDW: 22.1 % — ABNORMAL HIGH (ref 11.5–15.5)
WBC: 15 10*3/uL — ABNORMAL HIGH (ref 4.0–10.5)
nRBC: 0.6 % — ABNORMAL HIGH (ref 0.0–0.2)

## 2019-01-29 LAB — PHOSPHORUS: Phosphorus: 2.4 mg/dL — ABNORMAL LOW (ref 2.5–4.6)

## 2019-01-29 NOTE — Progress Notes (Signed)
NAME:  Kaitlin [name], MRN:  [id number], DOB:  1965-11-25, LOS: 51 ADMISSION DATE:  01/12/2019, CONSULTATION DATE:  1/4 REFERRING MD:  Olevia Bowens, CHIEF COMPLAINT:  Dyspnea   Brief History   54 y/o female admitted 1/1 with COVID pneumonia, worsening hypoxemia and intubated on 1/4  Past Medical History  Obesity DM2 HTN Chronic diastolic heart failrue Sickle trait Recent thymoma  Significant Hospital Events   1/01 Admitted started on supplemental oxygen, remdesivir, and systemic steroids  1/02 received Actemra 1/03 Oxygen needs improving, on remdesivir and steroids inflammatory markers starting to improve 1/04 Oxygen requirements increasing, had been down to 6 to 8 L via nasal cannula, this required titration up to 15 L.  She was more anxious.  There was not significant change in her chest x-ray.  The patient became more confused in the early evening hours got out of bed without oxygen after this event more obtunded pulse oximetry 82 to 86% on high flow nasal cannula and nonrebreather she was unresponsive to sternal rub she was intubated at [place].  Post intubation ABG still hypercarbic, had significant hypoxia 1/05 Aggressive IV diuresis started, proning protocol with heavy sedation initiated, left IJ triple-lumen catheter placed, echo ordered. 1/06 Newly identified LV dysfunction by echocardiogram with EF 45 to 50%.  Still fairly hypoxic requiring high FiO2/PEEP ongoing supportive care.  Changing propofol to Versed given rising triglycerides.  Did become more hypotensive requiring escalation of vasoactive drips, and up titration of PEEP and FiO2 after being placed back in the supine position.  Remdesivir completed  1/07 Worsening shock requiring further titration of vasoactive drips, change from phenylephrine to norepinephrine and vasopressin, stress dose steroids started raising concern for possible sepsis, diuresis discontinued, antibiotics widened 1/08 Fever curve improved, cultures all negative  thus far, pressor requirements improved excision requirements improved 1/13 Weaned on PSV 10/10 from 0900 until late PM.   1/14 Central line removed, weaned on PSV, remains sedate 1/16 weans on PSV, tachypneic and low tidal volumes, flaccid extremities  Consults:  PCCM  Procedures:  ETT 1/4 >>  L IJ CVL 1/5 >> 1/14 Arterial R radial 1/6 >> 1/13  Significant Diagnostic Tests:  Echo 1/6 >> Left ventricular ejection fraction, by visual estimation, is 45 to 50%. The left ventricle has mildly decreased function. The left ventricle is not well visualized. There is moderately increased left ventricular hypertrophy. Left ventricular diastolic parameters are indeterminate  Micro Data:  Coronavirus 2 antigen 1/1 >> Positive Blood cultures x2 1/1 >> negative Tracheal aspirate 1/7 >> negative  BCx2 1/7 >> negative  Antimicrobials:  Remdesivir 1/1 >> 1/6  Tocilizumab 1/2 x1  Decadron 1/1 >> to stress dose steroids 1/7 >> stopped 1/11  Vanco 1/4 >> 1/10  Cefepime 1/4 >> 1/7 Meropenem 1/7 >> 1/10  Cipro 1/7 >> 1/10  Interim history/subjective:   Follows commands intermittently.  Weaning but requiring high levels of support.  Objective   Blood pressure (!) 104/48, pulse (!) 105, temperature 99.1 F (37.3 C), temperature source Axillary, resp. rate (!) 34, height 5\' 9"  (1.753 m), weight (!) 172 kg, last menstrual period 06/17/2012, SpO2 96 %.    Vent Mode: PRVC FiO2 (%):  [40 %-50 %] 40 % Set Rate:  [20 bmp] 20 bmp Vt Set:  [400 mL] 400 mL PEEP:  [5 cmH20-8 cmH20] 5 cmH20 Pressure Support:  [8 [id number] cmH20] 12 cmH20 Plateau Pressure:  [17 cmH20] 17 cmH20   Intake/Output Summary (Last 24 hours) at 01/29/2019 1355 Last data filed at  01/29/2019 1100 Gross per 24 hour  Intake 3638.45 ml  Output 1200 ml  Net 2438.45 ml   Filed Weights   01/25/19 0456 01/26/19 0500 01/29/19 0450  Weight: (!) 174 kg (!) 176 kg (!) 172 kg    Examination: General: Obese woman supine in bed in NAD,  chronically ill appearing HEENT: MM pink/moist, ETT, pupils 49mm/reactive  Neuro: Awake, RASS -1, follows one-step commands, flaccid extremities with only flicker movement CV: 123456 RRR, no m/r/g PULM: Mild accessory muscle use on PSV, decreased breath sounds bilateral, no rhonchi GI: soft, bsx4 active  Extremities: warm/dry, 1+ edema  Skin: no rashes or lesions  Chest x-ray from 1/16 personally reviewed which shows stable bilateral airspace disease.  Labs show increased sodium, hypokalemia, mild increase in leukocytosis, stable thrombocytopenia   Resolved Hospital Problem list   Shock - mixed cardiogenic, septic   Assessment & Plan:   Critically ill due to ARDS due to COVID likely has OSA/OHS - complicated by element of pulmonary edema and new systemic cardiomyopathy  Completed remdesivir, toci, steroids.  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 Lines removed -monitor, low threshold to add antibiotics given leukocytosis however no source apparent  Systolic Cardiomyopathy, newly reduced EF down to 45 to 50% Grade 1 dCHF -tele monitoring  -diuresis as renal function / BP permit  AKI -resolved Hypernatremia -continue free water 300 every 4 -Trend BMP / urinary output -Replace hypokalemia -Avoid nephrotoxic agents   Elevated LFT's - LFT's trending back down - crestor, stopped 1/14  -avoid hepatotoxic agents  DM2 with Hyperglycemia -levemir 70 units BID -TF coverage, 18 units  Q4   Summary-nearing 2 weeks of mechanical ventilation, has made progress with weaning however severe critical illness myopathy main barrier to extubation now Daughter agreeable to tracheostomy if she fails trial of extubation  Best practice:  Diet: High nutritional risk-tube feeds at goal Pain/Anxiety/Delirium protocol (if indicated): Stop Precedex to see if this improves overall strength Ventilator goals: Continue to wean as tolerated. VAP protocol (if indicated): yes Blood pressure goals: Continue to keep MAP greater than 65.  Blood pressure should improve off Precedex. Fluid goals: Appears euvolemic to volume contracted.  No further diuresis (contraction alkalosis on serum chemistry.) DVT prophylaxis: Lovenox 0.5mg /kg per protocol GI prophylaxis: pepcid  Glucose control: SSI Mobility: Progressive mobilization Code Status: full code  Family Communication: [name] (daughter) 1/17 Disposition: ICU   CRITICAL CARE Performed by: Kipp Brood   Total critical care time: 40 minutes  Critical care time was exclusive of separately billable procedures and treating other patients.  Critical care was necessary to treat or prevent imminent or life-threatening deterioration.  Critical care was time spent personally by me on the following activities: development of treatment plan with patient and/or surrogate as well as nursing, discussions with consultants, evaluation of patient's response to treatment, examination of patient, obtaining history from patient or surrogate, ordering and performing treatments and interventions, ordering and review of laboratory studies, ordering and review of radiographic studies, pulse oximetry, re-evaluation of patient's condition and participation in multidisciplinary  rounds.  Kipp Brood, MD [facility] ICU Physician [place]  Pager: [phone] Mobile: [phone] After hours: [phone].   01/29/2019

## 2019-01-29 NOTE — Progress Notes (Signed)
Rehab Admissions Coordinator Note:  Per OT recommendation, this patient was screened by Raechel Ache for appropriateness for an Inpatient Acute Rehab Consult.   At this time, pt still intubated and not medically ready for rehab consult. Will follow along for appropriateness.   Raechel Ache 01/29/2019, 12:52 PM  I can be reached at [phone].

## 2019-01-29 NOTE — Progress Notes (Signed)
Occupational Therapy Evaluation Patient Details Name: Kaitlin [name] MRN: [id number] DOB: [birth year] Today's Date: 01/29/2019    History of Present Illness 54 y.o. female with medical history significant of anemia, bronchitis, bilateral cataracts, chronic diastolic CHF, type 2 diabetes, hyperlipidemia, hypertension, leukocytosis, morbid obesity, peripheral neuropathy, sickle cell trait, history of thymoma with history of thymoma resection . Intubated 1/4.    Clinical Impression   Prior to admission, pt lived with her daughter and was the caregiver for her 2 grandchildren (22 & 89 years old) and was independent with ADL and mobility. Pt enjoys watching the [employer] channel and listening to [place]. Pt intubated, peep of 5 with VSS with the exception of low BP at end of session. BP 103/60 initially. Left in modified chair position with BP 83/53 - nsg aware and monitoring. Pt with significant generalized weakness, but following commands and attempting to move all extremities on command with increased time. Pt lethargic but on low dose Precedex. Pt will need post acute rehab. If pt progresses, given her PLOF, feel she will be an excellent CIR candidate. Daughter states they will be able to provide 24/7 assistance after DC. Will follow acutely.     Follow Up Recommendations  CIR;Supervision/Assistance - 24 hour(pending progress)    Equipment Recommendations  3 in 1 bedside commode    Recommendations for Other Services Rehab consult     Precautions / Restrictions Precautions Precautions: Fall;Other (comment)(vent/ET)      Mobility Bed Mobility Overal bed mobility: Needs Assistance Bed Mobility: Rolling Rolling: Total assist;+2 for physical assistance            Transfers                 General transfer comment: not attempted today, Will need Maximove; possible Tenor    Balance Overall balance assessment: Needs assistance                                          ADL either performed or assessed with clinical judgement   ADL                                               Vision Baseline Vision/History: Wears glasses Wears Glasses: At all times Additional Comments: daughter thinks her glasses are here in the hospital     Perception     Praxis      Pertinent Vitals/Pain Pain Assessment: Faces Faces Pain Scale: Hurts a little bit Pain Location: generalized with ROM Pain Descriptors / Indicators: Grimacing Pain Intervention(s): Limited activity within patient's tolerance     Hand Dominance Right   Extremity/Trunk Assessment Upper Extremity Assessment Upper Extremity Assessment: Generalized weakness;RUE deficits/detail;LUE deficits/detail RUE Deficits / Details: spontaneous grip; assists with AAROM minimally; appears weaker promximally; Attmepts to "hold" flexion during ROM LUE Deficits / Details: simimlar to RUE   Lower Extremity Assessment Lower Extremity Assessment: Defer to PT evaluation(moving B feet spontaneously on command)       Communication Communication Communication: Other (comment)(intubated)   Cognition Arousal/Alertness: Lethargic;Suspect due to medications Behavior During Therapy: Flat affect Overall Cognitive Status: Difficult to assess  General Comments: pt following commands appropriately. will further assess   General Comments  Watched her grandchildren (2 &7); drives; cleans/cooks; enjoys going to church; enjoys gospel music; reads the Bible; likes the [employer] channel    Exercises Exercises: General Upper Extremity;General Lower Extremity General Exercises - Upper Extremity Shoulder Flexion: AAROM;Both;10 reps;Supine Shoulder ABduction: AAROM;PROM;Both;10 reps;Supine Elbow Flexion: AAROM;Both;10 reps;Supine Elbow Extension: AAROM;Both;10 reps;Supine Wrist Flexion: AAROM;Both;10 reps;Supine Wrist Extension: AAROM;Both;10  reps Digit Composite Flexion: AAROM;Both;10 reps;Supine Composite Extension: AAROM;Both;10 reps General Exercises - Lower Extremity Ankle Circles/Pumps: AAROM;Both;5 reps;Supine Heel Slides: Both;PROM;10 reps   Shoulder Instructions      Home Living Family/patient expects to be discharged to:: Private residence Living Arrangements: Children;Other relatives Available Help at Discharge: Available 24 hours/day Type of Home: Apartment Home Access: Level entry     Home Layout: One level     Bathroom Shower/Tub: Tub/shower unit;Curtain   Biochemist, clinical: Standard Bathroom Accessibility: Yes(maybe)   Home Equipment: None          Prior Functioning/Environment Level of Independence: Independent        Comments: does not work        OT Problem List: Decreased strength;Decreased range of motion;Decreased activity tolerance;Impaired balance (sitting and/or standing);Decreased coordination;Decreased safety awareness;Decreased knowledge of use of DME or AE;Cardiopulmonary status limiting activity;Obesity;Impaired UE functional use;Pain      OT Treatment/Interventions: Self-care/ADL training;Therapeutic exercise;Neuromuscular education;Energy conservation;DME and/or AE instruction;Therapeutic activities;Cognitive remediation/compensation;Patient/family education;Balance training    OT Goals(Current goals can be found in the care plan section) Acute Rehab OT Goals Patient Stated Goal: Family goal is for her to return home OT Goal Formulation: With family Time For Goal Achievement: 02/12/19 Potential to Achieve Goals: Good  OT Frequency: Min 3X/week   Barriers to D/C:            Co-evaluation              AM-PAC OT "6 Clicks" Daily Activity     Outcome Measure Help from another person eating meals?: Total Help from another person taking care of personal grooming?: Total Help from another person toileting, which includes using toliet, bedpan, or urinal?: Total Help  from another person bathing (including washing, rinsing, drying)?: Total Help from another person to put on and taking off regular upper body clothing?: Total Help from another person to put on and taking off regular lower body clothing?: Total 6 Click Score: 6   End of Session Equipment Utilized During Treatment: Other (comment)(vent) Nurse Communication: Mobility status  Activity Tolerance: Patient limited by lethargy;Patient tolerated treatment well Patient left: in bed;with nursing/sitter in room(chair position)  OT Visit Diagnosis: Other abnormalities of gait and mobility (R26.89);Muscle weakness (generalized) (M62.81)                Time: [id number] OT Time Calculation (min): 35 min Charges:  OT General Charges $OT Visit: 1 Visit OT Evaluation $OT Eval High Complexity: 1 High OT Treatments $Therapeutic Activity: 8-22 mins  Maurie Boettcher, OT/L   Acute OT Clinical Specialist Acute Rehabilitation Services Pager [phone] Office [phone]   [facility] 01/29/2019, 10:23 AM

## 2019-01-29 NOTE — Plan of Care (Signed)
Patient Summary: No adverse events throughout the night. No SOB or acute distress noted. No indications of pain noted. All lines and tubes remain in place. See MAR re: medications administered. See flowsheet re: assessment. Plan of care continued.  Problem: Education: Goal: Knowledge of General Education information will improve Description: Including pain rating scale, medication(s)/side effects and non-pharmacologic comfort measures Outcome: Progressing   Problem: Health Behavior/Discharge Planning: Goal: Ability to manage health-related needs will improve Outcome: Progressing   Problem: Clinical Measurements: Goal: Ability to maintain clinical measurements within normal limits will improve Outcome: Progressing Goal: Will remain free from infection Outcome: Progressing Goal: Diagnostic test results will improve Outcome: Progressing Goal: Respiratory complications will improve Outcome: Progressing Goal: Cardiovascular complication will be avoided Outcome: Progressing   Problem: Activity: Goal: Risk for activity intolerance will decrease Outcome: Progressing   Problem: Nutrition: Goal: Adequate nutrition will be maintained Outcome: Progressing   Problem: Coping: Goal: Level of anxiety will decrease Outcome: Progressing   Problem: Elimination: Goal: Will not experience complications related to bowel motility Outcome: Progressing Goal: Will not experience complications related to urinary retention Outcome: Progressing   Problem: Pain Managment: Goal: General experience of comfort will improve Outcome: Progressing   Problem: Safety: Goal: Ability to remain free from injury will improve Outcome: Progressing   Problem: Skin Integrity: Goal: Risk for impaired skin integrity will decrease Outcome: Progressing

## 2019-01-30 LAB — GLUCOSE, CAPILLARY
Glucose-Capillary: 116 mg/dL — ABNORMAL HIGH (ref 70–99)
Glucose-Capillary: 145 mg/dL — ABNORMAL HIGH (ref 70–99)
Glucose-Capillary: 148 mg/dL — ABNORMAL HIGH (ref 70–99)
Glucose-Capillary: 155 mg/dL — ABNORMAL HIGH (ref 70–99)
Glucose-Capillary: 163 mg/dL — ABNORMAL HIGH (ref 70–99)
Glucose-Capillary: 179 mg/dL — ABNORMAL HIGH (ref 70–99)
Glucose-Capillary: 212 mg/dL — ABNORMAL HIGH (ref 70–99)

## 2019-01-30 MED ORDER — MIDAZOLAM HCL 2 MG/2ML IJ SOLN: 5.0000 mg | Freq: Once | INTRAMUSCULAR | Status: DC

## 2019-01-30 MED ORDER — FENTANYL CITRATE (PF) 100 MCG/2ML IJ SOLN
200.0000 ug | Freq: Once | INTRAMUSCULAR | Status: AC
  Administered 2019-01-30: 200 ug via INTRAVENOUS
  Filled 2019-01-30: qty 4

## 2019-01-30 MED ORDER — VECURONIUM BROMIDE 10 MG IV SOLR: 10.0000 mg | Freq: Once | INTRAVENOUS | Status: DC

## 2019-01-30 MED ORDER — ETOMIDATE 2 MG/ML IV SOLN: 40.0000 mg | Freq: Once | INTRAVENOUS | Status: DC

## 2019-01-30 MED ORDER — FENTANYL 2500MCG IN NS 250ML (10MCG/ML) PREMIX INFUSION
0.0000 ug/h | INTRAVENOUS | Status: DC
  Administered 2019-01-30: 100 ug/h via INTRAVENOUS
  Filled 2019-01-30: qty 250

## 2019-01-30 MED ORDER — PROPOFOL 1000 MG/100ML IV EMUL
5.0000 ug/kg/min | INTRAVENOUS | Status: DC
  Administered 2019-01-30: 30 ug/kg/min via INTRAVENOUS
  Administered 2019-01-30: 20 ug/kg/min via INTRAVENOUS
  Administered 2019-01-31: 40 ug/kg/min via INTRAVENOUS
  Administered 2019-01-31: 20 ug/kg/min via INTRAVENOUS
  Filled 2019-01-30 (×4): qty 100

## 2019-01-30 MED ORDER — PROPOFOL 10 MG/ML IV BOLUS: 500.0000 mg | Freq: Once | INTRAVENOUS | Status: DC

## 2019-01-30 NOTE — Progress Notes (Signed)
Inpatient Diabetes Program Recommendations  AACE/ADA: New Consensus Statement on Inpatient Glycemic Control (2015)  Target Ranges:  Prepandial:   less than 140 mg/dL      Peak postprandial:   less than 180 mg/dL (1-2 hours)      Critically ill patients:  140 - 180 mg/dL   Lab Results  Component Value Date   GLUCAP 155 (H) 01/30/2019   HGBA1C 7.1 (H) 01/13/2019    Review of Glycemic Control Results for Kaitlin [name], Kaitlin [name] (MRN [id number]) as of 01/30/2019 09:46  Ref. Range 01/29/2019 22:32 01/30/2019 00:10 01/30/2019 05:13 01/30/2019 07:24  Glucose-Capillary Latest Ref Range: 70 - 99 mg/dL 246 (H) 212 (H) 145 (H) 155 (H)    Trends have much improved. Could benefit from slight  increasing Levemir to 80 units BID.   Thanks, Bronson Curb, MSN, RNC-OB Diabetes Coordinator [phone] (8a-5p)

## 2019-01-30 NOTE — Plan of Care (Signed)

## 2019-01-30 NOTE — Evaluation (Signed)
Physical Therapy Evaluation Patient Details Name: Kaitlin [name] MRN: [id number] DOB: 1966/01/02 Today's Date: 01/30/2019   History of Present Illness  54 y.o. female with medical history significant of anemia, bronchitis, bilateral cataracts, chronic diastolic CHF, type 2 diabetes, hyperlipidemia, hypertension, leukocytosis, morbid obesity, peripheral neuropathy, sickle cell trait, history of thymoma with history of thymoma resection . Intubated 1/4, possible trach 01/30/19.  Clinical Impression  Bed level assessment complete.  Pt awake, on PS mode on vent and tolerating it well.  Per RN goes on full support at night.  Dr. Lynetta Mare came in to assess for trach (possibly today).  Pt able to follow basic one step commands, is very weak.  She will need extensive post acute therapy as she stabilizes.   PT to follow acutely for deficits listed below.      Follow Up Recommendations CIR    Equipment Recommendations  Hospital bed;3in1 (PT);Rolling walker with 5" wheels(bariatric)    Recommendations for Other Services Rehab consult     Precautions / Restrictions Precautions Precautions: Fall;Other (comment) Precaution Comments: vent, morbid obesity      Mobility  Bed Mobility Overal bed mobility: Needs Assistance Bed Mobility: Rolling Rolling: +2 for physical assistance;Total assist(three person total assist)         General bed mobility comments: Pt was rolled for pericare and behind assessment with total assist +3 not really able to help to any degree.    Transfers                 General transfer comment: NT yet, will need bariatric equipment to do so safely.              Pertinent Vitals/Pain Pain Assessment: Faces Faces Pain Scale: No hurt    Home Living Family/patient expects to be discharged to:: Private residence Living Arrangements: Children;Other relatives Available Help at Discharge: Available 24 hours/day Type of Home: Apartment Home Access: Level  entry     Home Layout: One level Home Equipment: None      Prior Function Level of Independence: Independent         Comments: does not work     Engineer, manufacturing Dominance   Dominant Hand: Right    Extremity/Trunk Assessment   Upper Extremity Assessment Upper Extremity Assessment: Defer to OT evaluation    Lower Extremity Assessment Lower Extremity Assessment: RLE deficits/detail;LLE deficits/detail RLE Deficits / Details: bil LEs weak, but pt able to activate with assistance ankle, knee, and hip.  Heels look good without signs of pressure. LLE Deficits / Details: bil LEs weak, but pt able to activate with assistance ankle, knee, and hip.  Heels look good without signs of pressure.     Cervical / Trunk Assessment Cervical / Trunk Assessment: Normal(obese)  Communication   Communication: Other (comment)(intubated)  Cognition Arousal/Alertness: Awake/alert Behavior During Therapy: Flat affect Overall Cognitive Status: Difficult to assess                                 General Comments: Pt followed three one step commands for me today, was able to participate in AAROM to arms and legs.       General Comments General comments (skin integrity, edema, etc.): VSS on PS mode on vent FiO2 40% PEEP 5.  RR 30s, HR 100s during mobility. O2 sats stayed 88-100%.     Exercises General Exercises - Upper Extremity Shoulder Flexion: AAROM;Both;10 reps Elbow Flexion: AAROM;Both;10 reps Wrist  Flexion: AAROM;Both;10 reps Wrist Extension: AAROM;Both;10 reps General Exercises - Lower Extremity Ankle Circles/Pumps: AAROM;Both;20 reps Heel Slides: AAROM;Both;10 reps Hip ABduction/ADduction: AAROM;Both;10 reps   Assessment/Plan    PT Assessment Patient needs continued PT services  PT Problem List Decreased strength;Decreased activity tolerance;Decreased balance;Decreased mobility;Decreased knowledge of use of DME;Decreased knowledge of precautions;Cardiopulmonary status limiting  activity;Obesity       PT Treatment Interventions DME instruction;Gait training;Stair training;Functional mobility training;Therapeutic activities;Therapeutic exercise;Balance training;Cognitive remediation;Patient/family education;Neuromuscular re-education;Wheelchair mobility training    PT Goals (Current goals can be found in the Care Plan section)  Acute Rehab PT Goals Patient Stated Goal: unable to state, but nodded "yes" when I told her I would return to help her get stronger.  Family wants her home.  PT Goal Formulation: With family(see OT note from yesterday) Time For Goal Achievement: 02/13/19 Potential to Achieve Goals: Good    Frequency Min 3X/week           AM-PAC PT "6 Clicks" Mobility  Outcome Measure Help needed turning from your back to your side while in a flat bed without using bedrails?: Total Help needed moving from lying on your back to sitting on the side of a flat bed without using bedrails?: Total Help needed moving to and from a bed to a chair (including a wheelchair)?: Total Help needed standing up from a chair using your arms (e.g., wheelchair or bedside chair)?: Total Help needed to walk in hospital room?: Total Help needed climbing 3-5 steps with a railing? : Total 6 Click Score: 6    End of Session Equipment Utilized During Treatment: Oxygen Activity Tolerance: Patient tolerated treatment well Patient left: in bed;with call bell/phone within reach;with nursing/sitter in room   PT Visit Diagnosis: Muscle weakness (generalized) (M62.81);Difficulty in walking, not elsewhere classified (R26.2)    Time: 1100-1144 PT Time Calculation (min) (ACUTE ONLY): 44 min   Charges:        Verdene Lennert, PT, DPT  Acute Rehabilitation [phone] pager #[phone] office  @ [facility]: [phone]   PT Evaluation $PT Eval Moderate Complexity: 1 Mod PT Treatments $Therapeutic Activity: 23-37 mins        01/30/2019, 2:09 PM

## 2019-01-30 NOTE — Progress Notes (Signed)
Nutrition Follow-up RD working remotely.  DOCUMENTATION CODES:   Morbid obesity  INTERVENTION:   Continue TF via postpyloric Cortrak:  Vital AF 1.2 at 80 ml/h.  Provides 2304 kcal, 144 gm protein, 1557 ml free water daily.  NUTRITION DIAGNOSIS:   Increased nutrient needs related to acute illness(COVID 19) as evidenced by estimated needs.  Ongoing   GOAL:   Patient will meet greater than or equal to 90% of their needs  Met with TF  MONITOR:   Vent status, TF tolerance, Labs, I & O's  ASSESSMENT:   54 yo female admitted with COVID PNA on 1/1. Developed progressive hypoxemia and required intubation 1/4. PMH includes DM, HLD, obesity, anemia, sickle cell trait, HTN, NICM, CHF, neuropathy.  Cortrak placed 1/6, tip is at LOT. Currently receiving Vital 1.5 at 80 ml/h. Free water 300 ml every 4 hours. Tolerating well.  Patient remains intubated on ventilator support. Plans for tracheostomy this afternoon.  MV: 10.3 L/min Temp (24hrs), Avg:100.7 F (38.2 C), Min:98.8 F (37.1 C), Max:101.9 F (38.8 C)   1/18 labs reviewed. Sodium 150 (H), phosphorus 2.4 (L) CBG's: [phone]  Medications reviewed and include vitamin C, novolog, levemir, vitamin D3, zinc sulfate.  Weight on 1/10 176.4 kg Today's weight 165 kg I/O +4 L since admission  Diet Order:   Diet Order            Diet NPO time specified  Diet effective midnight        Diet NPO time specified  Diet effective now              EDUCATION NEEDS:   Not appropriate for education at this time  Skin:  Skin Assessment: Reviewed RN Assessment  Last BM:  1/19 rectal tube  Height:   Ht Readings from Last 1 Encounters:  01/25/19 '5\' 9"'  (1.753 m)    Weight:   Wt Readings from Last 1 Encounters:  01/30/19 (!) 165 kg    Ideal Body Weight:  65.9 kg  BMI:  Body mass index is 53.72 kg/m.  Estimated Nutritional Needs:   Kcal:  8299-3716  Protein:  135-165 gm  Fluid:  >/= 2.3  L    Molli Barrows, RD, LDN, [place] Pager [phone] After Hours Pager [phone]

## 2019-01-30 NOTE — Progress Notes (Signed)
[place] Progress Note Patient Name: Kaitlin [name] DOB: 07/16/1965 MRN: [id number]   Date of Service  01/30/2019  HPI/Events of Note  Pt is on the ventilator, poorly sedated, tachypneic and dyssynchronous.  eICU Interventions  Propofol and Fentanyl infusions ordered.        Frederik Pear 01/30/2019, 8:47 PM

## 2019-01-30 NOTE — Progress Notes (Signed)
NAME:  Kaitlin [name], MRN:  [id number], DOB:  November 10, 1965, LOS: 69 ADMISSION DATE:  01/12/2019, CONSULTATION DATE:  1/4 REFERRING MD:  Olevia Bowens, CHIEF COMPLAINT:  Dyspnea   Brief History   54 y/o female admitted 1/1 with COVID pneumonia, worsening hypoxemia and intubated on 1/4  Past Medical History  Obesity DM2 HTN Chronic diastolic heart failrue Sickle trait Recent thymoma  Significant Hospital Events   1/01 Admitted started on supplemental oxygen, remdesivir, and systemic steroids  1/02 received Actemra 1/03 Oxygen needs improving, on remdesivir and steroids inflammatory markers starting to improve 1/04 Oxygen requirements increasing, had been down to 6 to 8 L via nasal cannula, this required titration up to 15 L.  She was more anxious.  There was not significant change in her chest x-ray.  The patient became more confused in the early evening hours got out of bed without oxygen after this event more obtunded pulse oximetry 82 to 86% on high flow nasal cannula and nonrebreather she was unresponsive to sternal rub she was intubated at [place].  Post intubation ABG still hypercarbic, had significant hypoxia 1/05 Aggressive IV diuresis started, proning protocol with heavy sedation initiated, left IJ triple-lumen catheter placed, echo ordered. 1/06 Newly identified LV dysfunction by echocardiogram with EF 45 to 50%.  Still fairly hypoxic requiring high FiO2/PEEP ongoing supportive care.  Changing propofol to Versed given rising triglycerides.  Did become more hypotensive requiring escalation of vasoactive drips, and up titration of PEEP and FiO2 after being placed back in the supine position.  Remdesivir completed  1/07 Worsening shock requiring further titration of vasoactive drips, change from phenylephrine to norepinephrine and vasopressin, stress dose steroids started raising concern for possible sepsis, diuresis discontinued, antibiotics widened 1/08 Fever curve improved, cultures all negative  thus far, pressor requirements improved excision requirements improved 1/13 Weaned on PSV 10/10 from 0900 until late PM.   1/14 Central line removed, weaned on PSV, remains sedate 1/16 weans on PSV, tachypneic and low tidal volumes, flaccid extremities  Consults:  PCCM  Procedures:  ETT 1/4 >>  L IJ CVL 1/5 >> 1/14 Arterial R radial 1/6 >> 1/13  Significant Diagnostic Tests:  Echo 1/6 >> Left ventricular ejection fraction, by visual estimation, is 45 to 50%. The left ventricle has mildly decreased function. The left ventricle is not well visualized. There is moderately increased left ventricular hypertrophy. Left ventricular diastolic parameters are indeterminate  Micro Data:  Coronavirus 2 antigen 1/1 >> Positive Blood cultures x2 1/1 >> negative Tracheal aspirate 1/7 >> negative  BCx2 1/7 >> negative  Antimicrobials:  Remdesivir 1/1 >> 1/6  Tocilizumab 1/2 x1  Decadron 1/1 >> to stress dose steroids 1/7 >> stopped 1/11  Vanco 1/4 >> 1/10  Cefepime 1/4 >> 1/7 Meropenem 1/7 >> 1/10  Cipro 1/7 >> 1/10  Interim history/subjective:   Awake today but follows commands very weakly.  Objective   Blood pressure (!) 121/51, pulse 98, temperature 98.8 F (37.1 C), temperature source Oral, resp. rate (!) 28, height 5\' 9"  (1.753 m), weight (!) 165 kg, last menstrual period 06/17/2012, SpO2 96 %.    Vent Mode: PRVC FiO2 (%):  [30 %-40 %] 30 % Set Rate:  [20 bmp] 20 bmp Vt Set:  [400 mL] 400 mL PEEP:  [5 cmH20] 5 cmH20 Plateau Pressure:  [17 cmH20] 17 cmH20   Intake/Output Summary (Last 24 hours) at 01/30/2019 0935 Last data filed at 01/30/2019 0900 Gross per 24 hour  Intake 2179.6 ml  Output 950 ml  Net 1229.6 ml   Filed Weights   01/26/19 0500 01/29/19 0450 01/30/19 0500  Weight: (!) 176 kg (!) 172 kg (!) 165 kg    Examination: General: Obese woman supine in bed in NAD, chronically ill appearing HEENT: MM pink/moist, ETT, pupils 43mm/reactive  Neuro: Awake, RASS 0,  follows one-step commands, flaccid extremities 2/5 strength in the distal muscles. Unable to lift head off of bed. CV: s1s2 RRR, no m/r/g PULM: Mild accessory muscle use on PSV, decreased breath sounds bilateral, no rhonchi GI: soft, bsx4 active  Extremities: warm/dry, 1+ edema  Skin: no rashes or lesions     Resolved Hospital Problem list   Shock - mixed cardiogenic, septic   Assessment & Plan:   Critically ill due to ARDS due to COVID likely has OSA/OHS She is tolerating some degree of pressure support weaning but at levels too high to successfully extubate Her neuromuscular weakness is such that she would not be able to protect her airway We will consent her for tracheostomy.  Critical illness myopathy Significant barrier to extubation Needs tracheostomy Will require long-term rehabilitation.   Systolic Cardiomyopathy, newly reduced EF down to 45 to 50% Grade 1 dCHF Currently euvolemic Continue free water for hypernatremia  Transaminitis of acute illness - LFT's trending back down - crestor, stopped 1/14  -avoid hepatotoxic agents  DM2 with Hyperglycemia -levemir 70 units BID -TF coverage, 18 units Q4   Best practice:  Diet: High nutritional risk-tube feeds at goal Pain/Anxiety/Delirium protocol (if indicated): Off all sedative infusions Ventilator goals: Continue to wean as tolerated. We will plan for tracheostomy VAP protocol (if indicated): yes Blood pressure goals: Continue to keep MAP greater than 65.  Blood pressure should improve off Precedex. Fluid goals: Appears euvolemic to volume contracted.  No further diuresis (contraction alkalosis on serum chemistry.) DVT prophylaxis: Lovenox 0.5mg /kg per protocol GI prophylaxis: pepcid  Glucose control: Sliding scale insulin and insulin detemir.  Currently euglycemic. Mobility: Progressive mobilization Code Status: full code  Family Communication: [name] (daughter) 1/19 -she has consented to  tracheostomy Disposition: ICU   CRITICAL CARE Performed by: Kipp Brood   Total critical care time: 40 minutes  Critical care time was exclusive of separately billable procedures and treating other patients.  Critical care was necessary to treat or prevent imminent or life-threatening deterioration.  Critical care was time spent personally by me on the following activities: development of treatment plan with patient and/or surrogate as well as nursing, discussions with consultants, evaluation of patient's response to treatment, examination of patient, obtaining history from patient or surrogate, ordering and performing treatments and interventions, ordering and review of laboratory studies, ordering and review of radiographic studies, pulse oximetry, re-evaluation of patient's condition and participation in multidisciplinary rounds.  Kipp Brood, MD [facility] ICU Physician [place]  Pager: [phone] Mobile: [phone] After hours: [phone].   01/30/2019

## 2019-01-30 NOTE — Consult Note (Signed)
Physical Medicine and Rehabilitation Consult Reason for Consult: Impaired mobility and ADLs Referring Physician: Dr. Kipp Brood   HPI: Kaitlin [name] is a 54 y.o. female with morbid obesity, nonischemic cardiomyopathy, DM2, HTN, CHF, sickle cell trait, who was admitted on 1/1 with [id number] pneumonia complicated by worsening hypoxemia requiring intubation on 1/4.Treated with remdesivir, steroids, and Actemra. Extubated and breathing well on [place], upgraded to puree and honey thick today by SLP.  ROS +dysphagia, insomnia. -constipation, pain. ROS otherwise negative.  Past Medical History:  Diagnosis Date  . Anemia   . Bronchitis   . Cataracts, bilateral 01/12/2018  . CHF (congestive heart failure) ([place])   . Diabetes mellitus   . High cholesterol   . Hypertension    takes medicine to protect kidneys, does not have HTN  . Leukocytosis   . Morbid obesity ([place]) 09/11/2011   BMI 57  . Neuropathy 01/12/2018  . NICM (nonischemic cardiomyopathy) ([place]) 05/01/2013   Overview:  Last Assessment & Plan:  euvolemic on physical exam.   . Obesity   . Sickle cell trait ([place])   . Thymoma    Past Surgical History:  Procedure Laterality Date  . COLONOSCOPY N/A 06/21/2017   Procedure: COLONOSCOPY;  Surgeon: Ronnette Juniper, MD;  Location: [facility]OS[facility];  Service: Gastroenterology;  Laterality: N/A;  . LEFT HEART CATHETERIZATION WITH CORONARY ANGIOGRAM N/A 05/25/2013   Procedure: LEFT HEART CATHETERIZATION WITH CORONARY ANGIOGRAM;  Surgeon: Troy Sine, MD;  Location: [facility] CATH LAB;  Service: Cardiovascular;  Laterality: N/A;  . LYMPH NODE BIOPSY Right 11/30/2012   Procedure: RIGHT TONSIL BIOPSY WITH FRESH FROZEN ANALYSIS;  Surgeon: Jodi Marble, MD;  Location: [facility];  Service: ENT;  Laterality: Right;  . MEDIASTERNOTOMY N/A 01/12/2013   Procedure: MEDIAN STERNOTOMY;  Surgeon: Gaye Pollack, MD;  Location: [facility];  Service: Thoracic;  Laterality: N/A;  . MEDIASTINOTOMY CHAMBERLAIN MCNEIL Right  11/06/2012   Procedure: MEDIASTINOTOMY CHAMBERLAIN MCNEILPROCEDURE;  Surgeon: Gaye Pollack, MD;  Location: [facility];  Service: Thoracic;  Laterality: Right;  . POLYPECTOMY  06/21/2017   Procedure: POLYPECTOMY;  Surgeon: Ronnette Juniper, MD;  Location: [facility];  Service: Gastroenterology;;  . RESECTION OF A THYMOMA N/A 01/12/2013   Procedure: RESECTION OF A THYMOMA;  Surgeon: Gaye Pollack, MD;  Location: [facility];  Service: Thoracic;  Laterality: N/A;  . TONSILLECTOMY    . TUBAL LIGATION     Family History  Problem Relation Age of Onset  . Heart disease Father        No details  . Diabetes type II Other        Family HX  . Breast cancer Other    Social History:  reports that she is a non-smoker but has been exposed to tobacco smoke. She has never used smokeless tobacco. She reports that she does not drink alcohol or use drugs. Allergies:  Allergies  Allergen Reactions  . Ibuprofen Other (See Comments)    Per MD she should no longer take, unsure why  . Amoxicillin-Pot Clavulanate Diarrhea    Did it involve swelling of the face/tongue/throat, SOB, or low BP? No Did it involve sudden or severe rash/hives, skin peeling, or any reaction on the inside of your mouth or nose? No Did you need to seek medical attention at a hospital or doctor's office? No When did it last happen?Unknown If all above answers are "NO", may proceed with cephalosporin use.    Medications Prior to Admission  Medication Sig  Dispense Refill  . acetaminophen (TYLENOL) 500 MG tablet Take 1,000 mg by mouth every 6 (six) hours as needed for moderate pain or headache.    . albuterol (PROVENTIL HFA;VENTOLIN HFA) 108 (90 Base) MCG/ACT inhaler Inhale 1-2 puffs into the lungs every 6 (six) hours as needed for wheezing or shortness of breath. (Patient taking differently: Inhale 2 puffs into the lungs every 6 (six) hours as needed for wheezing or shortness of breath. ) 1 Inhaler 0  . ascorbic acid (VITAMIN C) 500 MG tablet  Take 1,500 mg by mouth daily.    Marland Kitchen aspirin EC 81 MG tablet Take 1 tablet (81 mg total) by mouth daily. 90 tablet 3  . benzonatate (TESSALON) 200 MG capsule Take 1 capsule (200 mg total) by mouth 3 (three) times daily. (Patient taking differently: Take 200 mg by mouth 3 (three) times daily as needed for cough. ) 20 capsule 0  . Biotin w/ Vitamins C & E (HAIR SKIN & NAILS GUMMIES PO) Take 2 tablets by mouth daily.    . calcium-vitamin D (OSCAL WITH D) 500-200 MG-UNIT tablet Take 2 tablets by mouth daily with breakfast.    . carvedilol (COREG) 25 MG tablet TAKE 1 TABLET BY MOUTH TWICE DAILY WITH A MEAL (Patient taking differently: Take 25 mg by mouth 2 (two) times daily with a meal. ) 60 tablet 1  . Cholecalciferol (VITAMIN D-3) 5000 units TABS Take 5,000 Units by mouth daily.    Marland Kitchen ELDERBERRY PO Take 1 tablet by mouth 2 (two) times daily.    . ferrous sulfate 325 (65 FE) MG tablet Take 325 mg by mouth daily with breakfast.    . fexofenadine (ALLEGRA) 180 MG tablet Take 180 mg by mouth daily.    . Insulin Human (INSULIN PUMP) SOLN Inject into the skin. Novolog    . lidocaine (LIDODERM) 5 % Place 3 patches onto the skin daily. Remove & Discard patch within 12 hours or as directed by MD (Patient taking differently: Place 3 patches onto the skin daily as needed (back pain). Remove & Discard patch within 12 hours or as directed by MD) 90 patch 6  . lisinopril (PRINIVIL,ZESTRIL) 10 MG tablet Take 1 tablet (10 mg total) by mouth daily. PLEASE CONTACT OFFICE FOR ADDITIONAL REFILLS 180 tablet 0  . metFORMIN (GLUCOPHAGE) 1000 MG tablet Take 1,000 mg by mouth 2 (two) times daily with a meal.    . Multiple Vitamin (MULTIVITAMIN WITH MINERALS) TABS tablet Take 1 tablet by mouth daily.    . Multiple Vitamins-Minerals (PRESERVISION AREDS) TABS Take 1 tablet by mouth 2 (two) times daily.    . rosuvastatin (CRESTOR) 40 MG tablet Take 40 mg by mouth daily.    . furosemide (LASIX) 40 MG tablet Take 1 tablet (40 mg total)  by mouth daily. (Patient taking differently: Take 40 mg by mouth 2 (two) times daily as needed for fluid or edema. ) 120 tablet 3  . potassium chloride SA (K-DUR,KLOR-CON) 20 MEQ tablet Take 1 tablet (20 mEq total) by mouth daily. 90 tablet 3  . Semaglutide,0.25 or 0.5MG /DOS, (OZEMPIC, 0.25 OR 0.5 MG/DOSE,) 2 MG/1.5ML SOPN Inject 0.25 mg into the skin every 7 (seven) days.      Home: Home Living Family/patient expects to be discharged to:: Private residence Living Arrangements: Children, Other relatives Available Help at Discharge: Available 24 hours/day Type of Home: Apartment Home Access: Level entry Home Layout: One level Bathroom Shower/Tub: Tub/shower unit, Architectural technologist: Standard Bathroom Accessibility: Yes(maybe) Home Equipment:  None  Functional History: Prior Function Level of Independence: Independent Comments: does not work Functional Status:  Mobility: Bed Mobility Overal bed mobility: Needs Assistance Bed Mobility: Rolling Rolling: Total assist, +2 for physical assistance Transfers General transfer comment: not attempted today      ADL:    Cognition: Cognition Overall Cognitive Status: Difficult to assess Orientation Level: Intubated/Tracheostomy - Unable to assess Cognition Arousal/Alertness: Lethargic, Suspect due to medications Behavior During Therapy: Flat affect Overall Cognitive Status: Difficult to assess General Comments: pt following commands appropriately. will further assess Difficult to assess due to: Intubated  Blood pressure (!) 120/58, pulse 97, temperature 98.8 F (37.1 C), temperature source Oral, resp. rate (!) 33, height 5\' 9"  (1.753 m), weight (!) 165 kg, last menstrual period 06/17/2012, SpO2 97 %.   Physical Exam  Gen: no distress, normal appearing, morbidly obese HEENT: oral mucosa pink and moist, NCAT, hypophonic Cardio: Reg rate Chest: normal effort, normal rate of breathing Abd: soft, non-distended Ext: no  edema Skin: intact Neuro: AOx3.  Musculoskeletal: 4-/5 strength in bilateral lower extremities and right hand. Unable to move left arm.  Psych: pleasant, normal affect  Results for orders placed or performed during the hospital encounter of 01/12/19 (from the past 24 hour(s))  Glucose, capillary     Status: Abnormal   Collection Time: 01/29/19  4:37 PM  Result Value Ref Range   Glucose-Capillary 185 (H) 70 - 99 mg/dL  Glucose, capillary     Status: Abnormal   Collection Time: 01/29/19 10:32 PM  Result Value Ref Range   Glucose-Capillary 246 (H) 70 - 99 mg/dL  Glucose, capillary     Status: Abnormal   Collection Time: 01/30/19 12:10 AM  Result Value Ref Range   Glucose-Capillary 212 (H) 70 - 99 mg/dL  Glucose, capillary     Status: Abnormal   Collection Time: 01/30/19  5:13 AM  Result Value Ref Range   Glucose-Capillary 145 (H) 70 - 99 mg/dL  Glucose, capillary     Status: Abnormal   Collection Time: 01/30/19  7:24 AM  Result Value Ref Range   Glucose-Capillary 155 (H) 70 - 99 mg/dL  Glucose, capillary     Status: Abnormal   Collection Time: 01/30/19 11:42 AM  Result Value Ref Range   Glucose-Capillary 163 (H) 70 - 99 mg/dL   No results found.   Assessment/Plan: Diagnosis: Acute respiratory distress syndrome due to COVID-19 virus 1. Does the need for close, 24 hr/day medical supervision in concert with the patient's rehab needs make it unreasonable for this patient to be served in a less intensive setting? Yes 2. Co-Morbidities requiring supervision/potential complications: HTN, HLD, Insulin dependent type 2 DM, grade 1 diastolic dysfunction, hypotension, circulatory shock, morbid obesity 3. Due to bladder management, bowel management, safety, skin/wound care, disease management, medication administration, pain management and patient education, does the patient require 24 hr/day rehab nursing? Yes 4. Does the patient require coordinated care of a physician, rehab nurse, therapy  disciplines of PT, OT, SLP to address physical and functional deficits in the context of the above medical diagnosis(es)? Yes Addressing deficits in the following areas: balance, endurance, locomotion, strength, transferring, bowel/bladder control, bathing, dressing, feeding, grooming, toileting, cognition, speech, language, swallowing and psychosocial support 5. Can the patient actively participate in an intensive therapy program of at least 3 hrs of therapy per day at least 5 days per week? Yes 6. The potential for patient to make measurable gains while on inpatient rehab is good 7. Anticipated functional outcomes  upon discharge from inpatient rehab are mod assist with PT and OT and MinA with SLP.  8. Estimated rehab length of stay to reach the above functional goals is: 2-3 weeks 9. Anticipated discharge destination: Home 10. Overall Rehab/Functional Prognosis: good  RECOMMENDATIONS: This patient's condition is appropriate for continued rehabilitative care in the following setting: CIR Patient has agreed to participate in recommended program. Yes Note that insurance prior authorization may be required for reimbursement for recommended care.  Comment: Mrs. Pandolfo would be an excellent rehabilitation candidate. Her last positive COVID test was 1/1 so she is currently 20 days out.  Disposition: Lives with her children who will be available 24/7. She was previously independent.  Insomnia: consider melatonin 3mg  HS to promote night-time sleep.  Morbid obesity: would need bariatric equipment in rehab.   Izora Ribas, MD 01/30/2019

## 2019-01-30 NOTE — Progress Notes (Signed)
PCCM:  I called and spoke with the patient's daughter via phone.  Discussed plan for tracheostomy and asked if there was any questions.  Patient has already been consented by Dr. Lynetta Mare.  Plans for tracheostomy placement this afternoon.  I also discussed the potential due to the patient's body habitus and BMI that if we were unable to access appropriate landmarks in the neck that percutaneous placement may not be possible but we would proceed with attempted percutaneous placement first and let her know if we were unable.  Patient's daughter was agreeable to this plan  Garner Nash, DO [facility] 01/30/2019 12:02 PM

## 2019-01-31 ENCOUNTER — Other Ambulatory Visit: Payer: Self-pay

## 2019-01-31 LAB — CBC WITH DIFFERENTIAL/PLATELET
Abs Immature Granulocytes: 0.06 10*3/uL (ref 0.00–0.07)
Basophils Absolute: 0 10*3/uL (ref 0.0–0.1)
Basophils Relative: 0 %
Eosinophils Absolute: 0 10*3/uL (ref 0.0–0.5)
Eosinophils Relative: 0 %
HCT: 30.9 % — ABNORMAL LOW (ref 36.0–46.0)
Hemoglobin: 9.8 g/dL — ABNORMAL LOW (ref 12.0–15.0)
Immature Granulocytes: 1 %
Lymphocytes Relative: 23 %
Lymphs Abs: 3.1 10*3/uL (ref 0.7–4.0)
MCH: 26.6 pg (ref 26.0–34.0)
MCHC: 31.7 g/dL (ref 30.0–36.0)
MCV: 84 fL (ref 80.0–100.0)
Monocytes Absolute: 1.1 10*3/uL — ABNORMAL HIGH (ref 0.1–1.0)
Monocytes Relative: 8 %
Neutro Abs: 9 10*3/uL — ABNORMAL HIGH (ref 1.7–7.7)
Neutrophils Relative %: 68 %
Platelets: 122 10*3/uL — ABNORMAL LOW (ref 150–400)
RBC: 3.68 MIL/uL — ABNORMAL LOW (ref 3.87–5.11)
RDW: 21.8 % — ABNORMAL HIGH (ref 11.5–15.5)
WBC: 13.3 10*3/uL — ABNORMAL HIGH (ref 4.0–10.5)
nRBC: 0.2 % (ref 0.0–0.2)

## 2019-01-31 LAB — GLUCOSE, CAPILLARY
Glucose-Capillary: 147 mg/dL — ABNORMAL HIGH (ref 70–99)
Glucose-Capillary: 101 mg/dL — ABNORMAL HIGH (ref 70–99)
Glucose-Capillary: 128 mg/dL — ABNORMAL HIGH (ref 70–99)
Glucose-Capillary: 133 mg/dL — ABNORMAL HIGH (ref 70–99)
Glucose-Capillary: 117 mg/dL — ABNORMAL HIGH (ref 70–99)
Glucose-Capillary: 89 mg/dL (ref 70–99)

## 2019-01-31 LAB — BASIC METABOLIC PANEL
Anion gap: 7 (ref 5–15)
BUN: 24 mg/dL — ABNORMAL HIGH (ref 6–20)
CO2: 32 mmol/L (ref 22–32)
Calcium: 8.5 mg/dL — ABNORMAL LOW (ref 8.9–10.3)
Chloride: 107 mmol/L (ref 98–111)
Creatinine, Ser: 0.56 mg/dL (ref 0.44–1.00)
GFR calc Af Amer: >60 mL/min (ref >60)
GFR calc non Af Amer: >60 mL/min (ref >60)
Glucose, Bld: 179 mg/dL — ABNORMAL HIGH (ref 70–99)
Potassium: 3.2 mmol/L — ABNORMAL LOW (ref 3.5–5.1)
Sodium: 146 mmol/L — ABNORMAL HIGH (ref 135–145)

## 2019-01-31 LAB — TRIGLYCERIDES: Triglycerides: 208 mg/dL — ABNORMAL HIGH (ref <150)

## 2019-01-31 LAB — PROTIME-INR
INR: 1.2 (ref 0.8–1.2)
Prothrombin Time: 14.9 seconds (ref 11.4–15.2)

## 2019-01-31 MED ORDER — ACETAMINOPHEN 325 MG PO TABS
650.0000 mg | ORAL_TABLET | Freq: Four times a day (QID) | ORAL | Status: DC | PRN
  Administered 2019-01-31 – 2019-02-04 (×8): 650 mg
  Filled 2019-01-31 (×10): qty 2

## 2019-01-31 MED ORDER — ACETAMINOPHEN 650 MG RE SUPP: 650.0000 mg | Freq: Four times a day (QID) | RECTAL | Status: DC | PRN

## 2019-01-31 MED ORDER — CARVEDILOL 3.125 MG PO TABS
3.1250 mg | ORAL_TABLET | Freq: Two times a day (BID) | ORAL | Status: DC
  Administered 2019-01-31 – 2019-02-02 (×5): 3.125 mg
  Filled 2019-01-31 (×7): qty 1

## 2019-01-31 MED ORDER — ROSUVASTATIN CALCIUM 20 MG PO TABS
40.0000 mg | ORAL_TABLET | Freq: Every day | ORAL | Status: DC
  Administered 2019-01-31 – 2019-02-09 (×10): 40 mg via ORAL
  Filled 2019-01-31: qty 1
  Filled 2019-01-31 (×2): qty 2
  Filled 2019-01-31 (×3): qty 4
  Filled 2019-01-31 (×3): qty 2
  Filled 2019-01-31: qty 4
  Filled 2019-01-31 (×2): qty 2
  Filled 2019-01-31 (×2): qty 1
  Filled 2019-01-31: qty 4

## 2019-01-31 MED ORDER — POTASSIUM CHLORIDE 20 MEQ/15ML (10%) PO SOLN
40.0000 meq | Freq: Four times a day (QID) | ORAL | Status: AC
  Administered 2019-01-31 (×2): 40 meq
  Filled 2019-01-31 (×2): qty 30

## 2019-01-31 MED ORDER — KETOROLAC TROMETHAMINE 15 MG/ML IJ SOLN
15.0000 mg | Freq: Four times a day (QID) | INTRAMUSCULAR | Status: DC | PRN
  Filled 2019-01-31: qty 1

## 2019-01-31 NOTE — Progress Notes (Signed)
[facility] Progress Note Patient Name: Kaitlin [name] DOB: Apr 29, 1965 MRN: [id number]   Date of Service  01/31/2019  HPI/Events of Note  Pt with musculoskeletal pain related to chest tube site  eICU Interventions  Toradol 15 mg iv Q 6 hours PRN pain x 3 doses.        Frederik Pear 01/31/2019, 7:42 PM

## 2019-01-31 NOTE — Procedures (Signed)
Extubation Procedure Note  Patient Details:   Name: Kaitlin [name] DOB: [birth year] MRN: [id number]   Airway Documentation:    Vent end date: 01/31/19 Vent end time: 0850   Evaluation  O2 sats: stable throughout Complications: No apparent complications Patient did tolerate procedure well. Bilateral Breath Sounds: Diminished   Yes   Leak test positive. No stridor noted.  Pt has strong cough.  Kaikoa Magro 01/31/2019, 8:59 AM

## 2019-01-31 NOTE — Progress Notes (Signed)
Updated pt's daughter.  Explained that strength and respiratory status had improved and she was extubated earlier today.  As such would not need trach at this point, but that next 48 hrs are crucial to determine if she can maintain herself off the ventilator.  Chesley Mires, MD [facility] Pulmonary/Critical Care 01/31/2019, 12:57 PM

## 2019-01-31 NOTE — Progress Notes (Signed)
ETT holder holder changed.  Mepilex applied to for skin protection.

## 2019-01-31 NOTE — Progress Notes (Signed)
NAME:  Kaitlin [name], MRN:  [id number], DOB:  [birth year], LOS: 3 ADMISSION DATE:  01/12/2019, CONSULTATION DATE:  1/4 REFERRING MD:  Olevia Bowens, CHIEF COMPLAINT:  Dyspnea   Brief History   54 yo female presented with fever, nausea, dyspnea, myalgias.  Found to have COVID 19 pneumonia with acute hypoxic respiratory failure.  Required intubation 1/04.   Past Medical History  Obesity, DM, HTN, diastolic CHF, Sickle cell trait, Thymoma s/p resection 2014  [place] [facility] Events   1/01 Admitted started on supplemental oxygen, remdesivir, and systemic steroids  1/02 received Actemra 1/03 Oxygen needs improving, on remdesivir and steroids inflammatory markers starting to improve 1/04 Oxygen requirements increasing, had been down to 6 to 8 L via nasal cannula, this required titration up to 15 L.  She was more anxious.  There was not significant change in her chest x-ray.  The patient became more confused in the early evening hours got out of bed without oxygen after this event more obtunded pulse oximetry 82 to 86% on high flow nasal cannula and nonrebreather she was unresponsive to sternal rub she was intubated at [place].  Post intubation ABG still hypercarbic, had significant hypoxia 1/05 Aggressive IV diuresis started, proning protocol with heavy sedation initiated, left IJ triple-lumen catheter placed, echo ordered. 1/06 Newly identified LV dysfunction by echocardiogram with EF 45 to 50%.  Still fairly hypoxic requiring high FiO2/PEEP ongoing supportive care.  Changing propofol to Versed given rising triglycerides.  Did become more hypotensive requiring escalation of vasoactive drips, and up titration of PEEP and FiO2 after being placed back in the supine position.  Remdesivir completed  1/07 Worsening shock requiring further titration of vasoactive drips, change from phenylephrine to norepinephrine and vasopressin, stress dose steroids started raising concern for possible sepsis, diuresis  discontinued, antibiotics widened 1/08 Fever curve improved, cultures all negative thus far, pressor requirements improved 1/14 Central line removed 1/16 weans on PS  Consults:   Procedures:  ETT 1/4 >> 1/20 L IJ CVL 1/5 >> 1/14 Arterial R radial 1/6 >> 1/13  Significant Diagnostic Tests:  Echo 1/6 >> Left ventricular ejection fraction, by visual estimation, is 45 to 50%. The left ventricle has mildly decreased function. The left ventricle is not well visualized. There is moderately increased left ventricular hypertrophy. Left ventricular diastolic parameters are indeterminate  Micro Data:  Coronavirus 2 antigen 1/1 >> Positive Blood cultures x2 1/1 >> negative Tracheal aspirate 1/7 >> negative  BCx2 1/7 >> negative  Antimicrobials:  Remdesivir 1/1 >> 1/6  Tocilizumab 1/2 x1  Decadron 1/1 >> to stress dose steroids 1/7 >> stopped 1/11  Vanco 1/4 >> 1/10  Cefepime 1/4 >> 1/7 Meropenem 1/7 >> 1/10  Cipro 1/7 >> 1/10  Interim history/subjective:  SBT.  Objective   Blood pressure 113/65, pulse 99, temperature 98.8 F (37.1 C), temperature source Axillary, resp. rate (!) 27, height 5\' 9"  (1.753 m), weight (!) 165 kg, last menstrual period 06/17/2012, SpO2 95 %.    Vent Mode: PRVC FiO2 (%):  [30 %-50 %] 50 % Set Rate:  [20 bmp] 20 bmp Vt Set:  [400 mL] 400 mL PEEP:  [5 cmH20] 5 cmH20 Pressure Support:  [14 cmH20] 14 cmH20 Plateau Pressure:  [17 cmH20-19 cmH20] 18 cmH20   Intake/Output Summary (Last 24 hours) at 01/31/2019 0920 Last data filed at 01/31/2019 0729 Gross per 24 hour  Intake 2596.79 ml  Output 800 ml  Net 1796.79 ml   Filed Weights   01/26/19 0500 01/29/19 0450 01/30/19  0500  Weight: (!) 176 kg (!) 172 kg (!) 165 kg    Examination:  General - alert Eyes - pupils reactive ENT - ETT in place, rash over nose Cardiac - regular rate/rhythm, no murmur Chest - scattered rhonchi Abdomen - soft, non tender, + bowel sounds Extremities - no cyanosis,  clubbing, or edema Skin - no rashes Neuro - follows commands, moves extremities Psych - normal mood and behavior   Resolved Hospital Problem list   Septic shock, cardiogenic shock, ARDS, Elevated LFTs from shock liver  Assessment & Plan:   Acute hypoxic respiratory failure from COVID 19 pneumonia. - extubation trial 1/20 - f/u CXR intermittently - SpO2 goal 85 to 95% - bronchial hygiene  Acute systolic CHF likely from sepsis induced CM. Acute on chronic diastolic CHF. - even fluid balance - continue ASA - resume coreg at low dose and titrate up to home dose as able - resume crestor - hold outpt lisinopril, lasix for now  DM type 2 poorly controlled. - used insulin pump as outpt - SSI with levemir - hold outpt metformin, ozempic  Dysphagia. - speech to assess after extubation  Deconditioning. - PT/OT  Hypernatremia. - continue free water  Hypokalemia. - f/u BMET  Anemia of critical illness. - f/u CBC - transfuse for Hb < 7   Best practice:  Diet: tube feeds DVT prophylaxis: lovenox GI prophylaxis: pepcid  Mobility: Progressive mobilization Code Status: full code  Disposition: ICU  Labs:   CMP Latest Ref Rng & Units 01/31/2019 01/29/2019 01/28/2019  Glucose 70 - 99 mg/dL 179(H) 192(H) 257(H)  BUN 6 - 20 mg/dL 24(H) 33(H) 29(H)  Creatinine 0.44 - 1.00 mg/dL 0.56 0.91 0.79  Sodium 135 - 145 mmol/L 146(H) 150(H) 150(H)  Potassium 3.5 - 5.1 mmol/L 3.2(L) 3.5 3.2(L)  Chloride 98 - 111 mmol/L 107 109 104  CO2 22 - 32 mmol/L 32 34(H) 35(H)  Calcium 8.9 - 10.3 mg/dL 8.5(L) 8.5(L) 8.9  Total Protein 6.5 - 8.1 g/dL - 5.3(L) 5.4(L)  Total Bilirubin 0.3 - 1.2 mg/dL - 0.7 0.8  Alkaline Phos 38 - 126 U/L - 60 76  AST 15 - 41 U/L - 53(H) 76(H)  ALT 0 - 44 U/L - 83(H) 107(H)    CBC Latest Ref Rng & Units 01/31/2019 01/29/2019 01/28/2019  WBC 4.0 - 10.5 K/uL 13.3(H) 15.0(H) 18.4(H)  Hemoglobin 12.0 - 15.0 g/dL 9.8(L) 11.8(L) 13.4  Hematocrit 36.0 - 46.0 % 30.9(L)  38.2 42.0  Platelets 150 - 400 K/uL 122(L) 136(L) 169    CBG (last 3)  Recent Labs    01/30/19 1933 01/30/19 2347 01/31/19 0726  GLUCAP 116* 179* 101*    ABG    Component Value Date/Time   PHART 7.443 01/25/2019 0211   PCO2ART 69.0 (HH) 01/25/2019 0211   PO2ART 79.0 (L) 01/25/2019 0211   HCO3 46.8 (H) 01/25/2019 0211   TCO2 49 (H) 01/25/2019 0211   ACIDBASEDEF 3.0 (H) 01/12/2013 1950   O2SAT 95.0 01/25/2019 0211    CC time 34 minutes  Chesley Mires, MD [place] 01/31/2019, 9:29 AM

## 2019-02-01 LAB — BASIC METABOLIC PANEL
Anion gap: 12 (ref 5–15)
BUN: 15 mg/dL (ref 6–20)
CO2: 31 mmol/L (ref 22–32)
Calcium: 8.7 mg/dL — ABNORMAL LOW (ref 8.9–10.3)
Chloride: 105 mmol/L (ref 98–111)
Creatinine, Ser: 0.47 mg/dL (ref 0.44–1.00)
GFR calc Af Amer: >60 mL/min (ref >60)
GFR calc non Af Amer: >60 mL/min (ref >60)
Glucose, Bld: 81 mg/dL (ref 70–99)
Potassium: 3.3 mmol/L — ABNORMAL LOW (ref 3.5–5.1)
Sodium: 148 mmol/L — ABNORMAL HIGH (ref 135–145)

## 2019-02-01 LAB — GLUCOSE, CAPILLARY
Glucose-Capillary: 85 mg/dL (ref 70–99)
Glucose-Capillary: 87 mg/dL (ref 70–99)
Glucose-Capillary: 185 mg/dL — ABNORMAL HIGH (ref 70–99)
Glucose-Capillary: 185 mg/dL — ABNORMAL HIGH (ref 70–99)

## 2019-02-01 LAB — CBC
HCT: 34.8 % — ABNORMAL LOW (ref 36.0–46.0)
Hemoglobin: 11 g/dL — ABNORMAL LOW (ref 12.0–15.0)
MCH: 26.3 pg (ref 26.0–34.0)
MCHC: 31.6 g/dL (ref 30.0–36.0)
MCV: 83.1 fL (ref 80.0–100.0)
Platelets: 204 10*3/uL (ref 150–400)
RBC: 4.19 MIL/uL (ref 3.87–5.11)
RDW: 22.5 % — ABNORMAL HIGH (ref 11.5–15.5)
WBC: 11.3 10*3/uL — ABNORMAL HIGH (ref 4.0–10.5)
nRBC: 0.7 % — ABNORMAL HIGH (ref 0.0–0.2)

## 2019-02-01 MED ORDER — RESOURCE THICKENUP CLEAR PO POWD
ORAL | Status: DC | PRN
  Filled 2019-02-01: qty 125

## 2019-02-01 MED ORDER — INSULIN DETEMIR 100 UNIT/ML ~~LOC~~ SOLN
30.0000 [IU] | Freq: Every day | SUBCUTANEOUS | Status: DC
  Administered 2019-02-02 – 2019-02-05 (×4): 30 [IU] via SUBCUTANEOUS
  Filled 2019-02-01 (×4): qty 0.3

## 2019-02-01 MED ORDER — INSULIN ASPART 100 UNIT/ML ~~LOC~~ SOLN
10.0000 [IU] | SUBCUTANEOUS | Status: DC
  Administered 2019-02-01 – 2019-02-02 (×6): 10 [IU] via SUBCUTANEOUS

## 2019-02-01 MED ORDER — POTASSIUM CHLORIDE 20 MEQ/15ML (10%) PO SOLN
40.0000 meq | Freq: Four times a day (QID) | ORAL | Status: AC
  Administered 2019-02-01 (×2): 40 meq
  Filled 2019-02-01 (×2): qty 30

## 2019-02-01 NOTE — Care Management (Signed)
Pt recommended for CIR.  CM reached out CIR to inform pt is now extubated.  CIR will revisit case in 48 hours (to give time for respiratory status to declare if pt can remain off the vent).  TOC will continue to follow

## 2019-02-01 NOTE — Progress Notes (Signed)
Attempted to contact pt's daughter.  Left message on voicemail saying respiratory status stable after extubation.  Main issue will be continuing rehab and f/u with speech therapy.  Advised her to call to unit with additional questions.  Chesley Mires, MD [facility] Pulmonary/Critical Care 02/01/2019, 1:15 PM

## 2019-02-01 NOTE — Progress Notes (Signed)
Physical Therapy Treatment Patient Details Name: Kaitlin [name] MRN: [id number] DOB: 1965/10/28 Today's Date: 02/01/2019    History of Present Illness 54 y.o. female with medical history significant of anemia, bronchitis, bilateral cataracts, chronic diastolic CHF, type 2 diabetes, hyperlipidemia, hypertension, leukocytosis, morbid obesity, peripheral neuropathy, sickle cell trait, history of thymoma with history of thymoma resection . Intubated 1/4, possible trach 01/30/19.    PT Comments    Bed level exercises completed.  Pt speaking in hushed tones, but audible.  A &O x 4 for me, but some delayed processing.  She is following commands and looks good on O2 via [place].  VSS throughout.  I was hopeful that she would be able to move more and attempted to get her to pull her chest up off of the bed, but she is unable due to bil UE weakness, pain, and dysfunction.  She is unable to push and pull much at this time.  It will take at least three people to get her to the EOB and we are planning on arranging bariatric chair, BSC, and tenor lift to attempt OOB tomorrow.  PT will continue to follow acutely for safe mobility progression.    Follow Up Recommendations  CIR     Equipment Recommendations  3in1 (PT);Wheelchair (measurements PT);Wheelchair cushion (measurements PT);Hospital bed;Rolling walker with 5" wheels(bariatric)    Recommendations for Other Services   NA     Precautions / Restrictions Precautions Precautions: Fall;Other (comment) Precaution Comments: morbid obesity          Cognition Arousal/Alertness: Awake/alert Behavior During Therapy: WFL for tasks assessed/performed Overall Cognitive Status: Impaired/Different from baseline Area of Impairment: Problem solving                             Problem Solving: Slow processing General Comments: Pt A and O x4, slow to process at times, personality starting to show through (rolling eyes, facial expressions).        Exercises General Exercises - Upper Extremity Shoulder Flexion: AAROM;Both;10 reps Elbow Flexion: AAROM;Both;10 reps Wrist Extension: AAROM;Both;10 reps General Exercises - Lower Extremity Ankle Circles/Pumps: AAROM;Both;20 reps Heel Slides: AAROM;Both;20 reps    General Comments General comments (skin integrity, edema, etc.): Attempted to have pt put her hands on bed rails and pull her trunk off of the bed.  She is able to lift her head, but unable to lift shoulder blades off of the bed both due to UE weakness, pain, and dysfunction, but also likely due to trunkal weakness/body habitus.  She will require at least three people to get to EOB.  Planning for tomorrow with more hands available.       Pertinent Vitals/Pain Pain Assessment: Faces Faces Pain Scale: Hurts whole lot Pain Location: bil UEs with ROM Pain Descriptors / Indicators: Grimacing;Guarding Pain Intervention(s): Limited activity within patient's tolerance;Monitored during session;Repositioned           PT Goals (current goals can now be found in the care plan section) Acute Rehab PT Goals Patient Stated Goal: to get home to her grandbabies Progress towards PT goals: Progressing toward goals    Frequency    Min 3X/week      PT Plan Current plan remains appropriate       AM-PAC PT "6 Clicks" Mobility   Outcome Measure  Help needed turning from your back to your side while in a flat bed without using bedrails?: Total Help needed moving from lying on your back  to sitting on the side of a flat bed without using bedrails?: Total Help needed moving to and from a bed to a chair (including a wheelchair)?: Total Help needed standing up from a chair using your arms (e.g., wheelchair or bedside chair)?: Total Help needed to walk in hospital room?: Total Help needed climbing 3-5 steps with a railing? : Total 6 Click Score: 6    End of Session Equipment Utilized During Treatment: Oxygen Activity Tolerance:  Patient tolerated treatment well Patient left: in bed Nurse Communication: Mobility status PT Visit Diagnosis: Muscle weakness (generalized) (M62.81);Difficulty in walking, not elsewhere classified (R26.2)     Time: [id number] PT Time Calculation (min) (ACUTE ONLY): 28 min  Charges:  $Therapeutic Exercise: 23-37 mins                    Verdene Lennert, PT, DPT  Acute Rehabilitation [phone] pager #[phone] office  @ [facility]: [phone]   02/01/2019, 2:52 PM

## 2019-02-01 NOTE — Progress Notes (Signed)
NAME:  Kaitlin [name], MRN:  [id number], DOB:  1965-12-12, LOS: 53 ADMISSION DATE:  01/12/2019, CONSULTATION DATE:  1/4 REFERRING MD:  Olevia Bowens, CHIEF COMPLAINT:  Dyspnea   Brief History   54 yo female presented with fever, nausea, dyspnea, myalgias.  Found to have COVID 19 pneumonia with acute hypoxic respiratory failure.  Required intubation 1/04.   Past Medical History  Obesity, DM, HTN, diastolic CHF, Sickle cell trait, Thymoma s/p resection 2014  [place] [facility] Events   1/01 Admitted started on supplemental oxygen, remdesivir, and systemic steroids  1/02 received Actemra 1/03 Oxygen needs improving, on remdesivir and steroids inflammatory markers starting to improve 1/04 Oxygen requirements increasing, had been down to 6 to 8 L via nasal cannula, this required titration up to 15 L.  She was more anxious.  There was not significant change in her chest x-ray.  The patient became more confused in the early evening hours got out of bed without oxygen after this event more obtunded pulse oximetry 82 to 86% on high flow nasal cannula and nonrebreather she was unresponsive to sternal rub she was intubated at [place].  Post intubation ABG still hypercarbic, had significant hypoxia 1/05 Aggressive IV diuresis started, proning protocol with heavy sedation initiated, left IJ triple-lumen catheter placed, echo ordered. 1/06 Newly identified LV dysfunction by echocardiogram with EF 45 to 50%.  Still fairly hypoxic requiring high FiO2/PEEP ongoing supportive care.  Changing propofol to Versed given rising triglycerides.  Did become more hypotensive requiring escalation of vasoactive drips, and up titration of PEEP and FiO2 after being placed back in the supine position.  Remdesivir completed  1/07 Worsening shock requiring further titration of vasoactive drips, change from phenylephrine to norepinephrine and vasopressin, stress dose steroids started raising concern for possible sepsis, diuresis  discontinued, antibiotics widened 1/08 Fever curve improved, cultures all negative thus far, pressor requirements improved 1/14 Central line removed 1/16 weans on PS 1/20 extubated  Consults:   Procedures:  ETT 1/4 >> 1/20 L IJ CVL 1/5 >> 1/14 Arterial R radial 1/6 >> 1/13  Significant Diagnostic Tests:  Echo 1/6 >> Left ventricular ejection fraction, by visual estimation, is 45 to 50%. The left ventricle has mildly decreased function. The left ventricle is not well visualized. There is moderately increased left ventricular hypertrophy. Left ventricular diastolic parameters are indeterminate  Micro Data:  Coronavirus 2 antigen 1/1 >> Positive Blood cultures x2 1/1 >> negative Tracheal aspirate 1/7 >> negative  BCx2 1/7 >> negative  Antimicrobials:  Remdesivir 1/1 >> 1/6  Tocilizumab 1/2 x1  Decadron 1/1 >> to stress dose steroids 1/7 >> stopped 1/11  Vanco 1/4 >> 1/10  Cefepime 1/4 >> 1/7 Meropenem 1/7 >> 1/10  Cipro 1/7 >> 1/10  Interim history/subjective:  Mild chest soreness.  Feels like her breathing is improving.  Denies abdominal pain.  Still feels weak.  Objective   Blood pressure 108/65, pulse (!) 103, temperature 99.3 F (37.4 C), temperature source Axillary, resp. rate (!) 25, height 5\' 9"  (1.753 m), weight (!) 165 kg, last menstrual period 06/17/2012, SpO2 97 %.        Intake/Output Summary (Last 24 hours) at 02/01/2019 1041 Last data filed at 02/01/2019 0600 Gross per 24 hour  Intake 2275 ml  Output 900 ml  Net 1375 ml   Filed Weights   01/26/19 0500 01/29/19 0450 01/30/19 0500  Weight: (!) 176 kg (!) 172 kg (!) 165 kg    Examination:  General - alert Eyes - pupils reactive  ENT - no sinus tenderness, no stridor, abrasion over nose Cardiac - regular rate/rhythm, no murmur Chest - scattered rhonchi, adequate cough Abdomen - soft, non tender, + bowel sounds Extremities - no cyanosis, clubbing, or edema Skin - no rashes Neuro - able to shrug  shoulders and lift feet off bed, hard for her to lift her whole arm or leg off bed Psych - normal mood and behavior   Resolved Hospital Problem list   Septic shock, cardiogenic shock, ARDS, Elevated LFTs from shock liver  Assessment & Plan:   Acute hypoxic respiratory failure from COVID 19 pneumonia. - goal SpO2 85 to 95% - f/u CXR intermittently - bronchial hygiene  Acute systolic CHF likely from sepsis induced CM. Acute on chronic diastolic CHF. - even fluid balance - continue ASA, crestor - resumed coreg 1/20 and titrate up to home dose as able - hold outpt lisinopril, lasix for now  DM type 2 poorly controlled. - used insulin pump as outpt - SSI - change levemir to 30 units daily - hold outpt metformin, ozempic  Dysphagia. - f/u with speech therapy - keep cortrak in for now  Deconditioning. - PT/OT - likely will need CIR  Hypernatremia. - continue free water  Hypokalemia. - replace potassium  Anemia of critical illness. - f/u CBC - transfuse for Hb < 7  Nasal abrasion. - neosporin ointment   Best practice:  Diet: tube feeds DVT prophylaxis: lovenox GI prophylaxis: not indicated Mobility: Progressive mobilization Code Status: full code  Disposition: ICU  Labs:   CMP Latest Ref Rng & Units 02/01/2019 01/31/2019 01/29/2019  Glucose 70 - 99 mg/dL 81 179(H) 192(H)  BUN 6 - 20 mg/dL 15 24(H) 33(H)  Creatinine 0.44 - 1.00 mg/dL 0.47 0.56 0.91  Sodium 135 - 145 mmol/L 148(H) 146(H) 150(H)  Potassium 3.5 - 5.1 mmol/L 3.3(L) 3.2(L) 3.5  Chloride 98 - 111 mmol/L 105 107 109  CO2 22 - 32 mmol/L 31 32 34(H)  Calcium 8.9 - 10.3 mg/dL 8.7(L) 8.5(L) 8.5(L)  Total Protein 6.5 - 8.1 g/dL - - 5.3(L)  Total Bilirubin 0.3 - 1.2 mg/dL - - 0.7  Alkaline Phos 38 - 126 U/L - - 60  AST 15 - 41 U/L - - 53(H)  ALT 0 - 44 U/L - - 83(H)    CBC Latest Ref Rng & Units 02/01/2019 01/31/2019 01/29/2019  WBC 4.0 - 10.5 K/uL 11.3(H) 13.3(H) 15.0(H)  Hemoglobin 12.0 - 15.0 g/dL  11.0(L) 9.8(L) 11.8(L)  Hematocrit 36.0 - 46.0 % 34.8(L) 30.9(L) 38.2  Platelets 150 - 400 K/uL 204 122(L) 136(L)    CBG (last 3)  Recent Labs    01/31/19 2259 02/01/19 0328 02/01/19 0735  GLUCAP 89 85 87    ABG    Component Value Date/Time   PHART 7.443 01/25/2019 0211   PCO2ART 69.0 (HH) 01/25/2019 0211   PO2ART 79.0 (L) 01/25/2019 0211   HCO3 46.8 (H) 01/25/2019 0211   TCO2 49 (H) 01/25/2019 0211   ACIDBASEDEF 3.0 (H) 01/12/2013 1950   O2SAT 95.0 01/25/2019 [place], MD [place] Pulmonary/Critical Care 02/01/2019, 10:41 AM

## 2019-02-01 NOTE — Evaluation (Signed)
Clinical/Bedside Swallow Evaluation Patient Details  Name: Kaitlin [name] MRN: [id number] Date of Birth: 1965-06-10  Today's Date: 02/01/2019 Time: SLP Start Time (ACUTE ONLY): 1121 SLP Stop Time (ACUTE ONLY): 1157 SLP Time Calculation (min) (ACUTE ONLY): 36 min  Past Medical History:  Past Medical History:  Diagnosis Date  . Anemia   . Bronchitis   . Cataracts, bilateral 01/12/2018  . CHF (congestive heart failure) ([place])   . Diabetes mellitus   . High cholesterol   . Hypertension    takes medicine to protect kidneys, does not have HTN  . Leukocytosis   . Morbid obesity ([place]) 09/11/2011   BMI 57  . Neuropathy 01/12/2018  . NICM (nonischemic cardiomyopathy) ([place]) 05/01/2013   Overview:  Last Assessment & Plan:  euvolemic on physical exam.   . Obesity   . Sickle cell trait ([place])   . Thymoma    Past Surgical History:  Past Surgical History:  Procedure Laterality Date  . COLONOSCOPY N/A 06/21/2017   Procedure: COLONOSCOPY;  Surgeon: Ronnette Juniper, MD;  Location: [facility] ENDOSCOPY;  Service: Gastroenterology;  Laterality: N/A;  . LEFT HEART CATHETERIZATION WITH CORONARY ANGIOGRAM N/A 05/25/2013   Procedure: LEFT HEART CATHETERIZATION WITH CORONARY ANGIOGRAM;  Surgeon: Troy Sine, MD;  Location: [facility] CATH LAB;  Service: Cardiovascular;  Laterality: N/A;  . LYMPH NODE BIOPSY Right 11/30/2012   Procedure: RIGHT TONSIL BIOPSY WITH FRESH FROZEN ANALYSIS;  Surgeon: Jodi Marble, MD;  Location: [place];  Service: ENT;  Laterality: Right;  . MEDIASTERNOTOMY N/A 01/12/2013   Procedure: MEDIAN STERNOTOMY;  Surgeon: Gaye Pollack, MD;  Location: [place];  Service: Thoracic;  Laterality: N/A;  . MEDIASTINOTOMY CHAMBERLAIN MCNEIL Right 11/06/2012   Procedure: MEDIASTINOTOMY CHAMBERLAIN MCNEILPROCEDURE;  Surgeon: Gaye Pollack, MD;  Location: [place];  Service: Thoracic;  Laterality: Right;  . POLYPECTOMY  06/21/2017   Procedure: POLYPECTOMY;  Surgeon: Ronnette Juniper, MD;  Location: [facility];  Service:  Gastroenterology;;  . RESECTION OF A THYMOMA N/A 01/12/2013   Procedure: RESECTION OF A THYMOMA;  Surgeon: Gaye Pollack, MD;  Location: [place];  Service: Thoracic;  Laterality: N/A;  . TONSILLECTOMY    . TUBAL LIGATION     HPI:  54 y.o. female with medical history significant of anemia, bronchitis, bilateral cataracts, chronic diastolic CHF, type 2 diabetes, hyperlipidemia, hypertension, leukocytosis, morbid obesity, peripheral neuropathy, sickle cell trait, history of thymoma resection . Intubated 1/4-1/20.   Assessment / Plan / Recommendation Clinical Impression  Pt was alert, following commands demonstrating typical altered mental status/confusion due to Covid and coming off meds after 16 day intubation. Able to achieve phonation intermittently but mostly aphonic with weak volitional cough. She was tolerating HFNC 4L with sats in upper 90's and stable RR. Coordinated swallow demonstrated with honey, nectar thick and thin liquids. Increased work of breathing following thin but no s/s aspiration during assessment. Aspiration risk is high after 16 day intubation especially with thinner consistencies. Conservative recommendation for honey thick liquids, puree texture, crush pills, needs feeding assist and full supervision at present at slow rate. Will follow closely.    SLP Visit Diagnosis: Dysphagia, unspecified (R13.10)    Aspiration Risk  Mild aspiration risk;Moderate aspiration risk    Diet Recommendation Dysphagia 1 (Puree);Honey-thick liquid   Liquid Administration via: Cup Medication Administration: Crushed with puree Supervision: Staff to assist with self feeding;Full supervision/cueing for compensatory strategies Compensations: Slow rate;Small sips/bites;Minimize environmental distractions;Lingual sweep for clearance of pocketing Postural Changes: Seated upright at 90 degrees  Other  Recommendations Oral Care Recommendations: Oral care BID Other Recommendations: Order thickener  from pharmacy   Follow up Recommendations Inpatient Rehab      Frequency and Duration min 2x/week  2 weeks       Prognosis Prognosis for Safe Diet Advancement: Good Barriers to Reach Goals: Cognitive deficits      Swallow Study   General HPI: 54 y.o. female with medical history significant of anemia, bronchitis, bilateral cataracts, chronic diastolic CHF, type 2 diabetes, hyperlipidemia, hypertension, leukocytosis, morbid obesity, peripheral neuropathy, sickle cell trait, history of thymoma resection . Intubated 1/4-1/20. Type of Study: Bedside Swallow Evaluation Previous Swallow Assessment: (none) Diet Prior to this Study: NPO Temperature Spikes Noted: No Respiratory Status: Other (comment)(HFNC 4) History of Recent Intubation: Yes Length of Intubations (days): 16 days Date extubated: 01/31/19 Behavior/Cognition: Alert;Cooperative;Pleasant mood;Confused;Requires cueing Oral Cavity Assessment: Other (comment)(scab area on tongue) Oral Care Completed by SLP: Yes Oral Cavity - Dentition: Adequate natural dentition Vision: Functional for self-feeding Self-Feeding Abilities: Total assist(current bil UE weakness) Patient Positioning: Upright in bed Baseline Vocal Quality: Hoarse;Other (comment)(brief periods of aphonia) Volitional Cough: Weak Volitional Swallow: Able to elicit    Oral/Motor/Sensory Function Overall Oral Motor/Sensory Function: Within functional limits   Ice Chips Ice chips: Within functional limits   Thin Liquid Thin Liquid: Impaired Presentation: Cup;Straw Pharyngeal  Phase Impairments: Cough - Delayed    Nectar Thick Nectar Thick Liquid: Within functional limits Presentation: Cup   Honey Thick Honey Thick Liquid: Within functional limits   Puree Puree: Within functional limits   Solid     Solid: Within functional limits      [place] Siren 02/01/2019,2:15 PM

## 2019-02-02 ENCOUNTER — Ambulatory Visit: Payer: Medicaid Other

## 2019-02-02 DIAGNOSIS — E785 Hyperlipidemia, unspecified: Secondary | ICD-10-CM

## 2019-02-02 DIAGNOSIS — I519 Heart disease, unspecified: Secondary | ICD-10-CM

## 2019-02-02 LAB — MAGNESIUM: Magnesium: 2.4 mg/dL (ref 1.7–2.4)

## 2019-02-02 LAB — BASIC METABOLIC PANEL
Anion gap: 10 (ref 5–15)
BUN: 14 mg/dL (ref 6–20)
CO2: 29 mmol/L (ref 22–32)
Calcium: 8.9 mg/dL (ref 8.9–10.3)
Chloride: 109 mmol/L (ref 98–111)
Creatinine, Ser: 0.59 mg/dL (ref 0.44–1.00)
GFR calc Af Amer: >60 mL/min (ref >60)
GFR calc non Af Amer: >60 mL/min (ref >60)
Glucose, Bld: 70 mg/dL (ref 70–99)
Potassium: 3.8 mmol/L (ref 3.5–5.1)
Sodium: 148 mmol/L — ABNORMAL HIGH (ref 135–145)

## 2019-02-02 LAB — GLUCOSE, CAPILLARY
Glucose-Capillary: 109 mg/dL — ABNORMAL HIGH (ref 70–99)
Glucose-Capillary: 145 mg/dL — ABNORMAL HIGH (ref 70–99)
Glucose-Capillary: 142 mg/dL — ABNORMAL HIGH (ref 70–99)
Glucose-Capillary: 138 mg/dL — ABNORMAL HIGH (ref 70–99)

## 2019-02-02 MED ORDER — INSULIN ASPART 100 UNIT/ML ~~LOC~~ SOLN
10.0000 [IU] | Freq: Three times a day (TID) | SUBCUTANEOUS | Status: DC
  Administered 2019-02-02 – 2019-02-06 (×12): 10 [IU] via SUBCUTANEOUS

## 2019-02-02 MED ORDER — CARVEDILOL 6.25 MG PO TABS
6.2500 mg | ORAL_TABLET | Freq: Two times a day (BID) | ORAL | Status: DC
  Administered 2019-02-02 – 2019-02-04 (×4): 6.25 mg
  Filled 2019-02-02 (×6): qty 1

## 2019-02-02 MED ORDER — INSULIN ASPART 100 UNIT/ML ~~LOC~~ SOLN
0.0000 [IU] | Freq: Three times a day (TID) | SUBCUTANEOUS | Status: DC
  Administered 2019-02-02: 3 [IU] via SUBCUTANEOUS
  Administered 2019-02-03 (×2): 4 [IU] via SUBCUTANEOUS
  Administered 2019-02-03 – 2019-02-04 (×3): 7 [IU] via SUBCUTANEOUS
  Administered 2019-02-04 – 2019-02-05 (×2): 11 [IU] via SUBCUTANEOUS
  Administered 2019-02-05: 7 [IU] via SUBCUTANEOUS
  Administered 2019-02-05 – 2019-02-06 (×3): 11 [IU] via SUBCUTANEOUS
  Administered 2019-02-06: 18:00:00 4 [IU] via SUBCUTANEOUS
  Administered 2019-02-07: 12:00:00 11 [IU] via SUBCUTANEOUS
  Administered 2019-02-07 (×2): 7 [IU] via SUBCUTANEOUS
  Administered 2019-02-08: 15 [IU] via SUBCUTANEOUS
  Administered 2019-02-08: 7 [IU] via SUBCUTANEOUS
  Administered 2019-02-08: 11 [IU] via SUBCUTANEOUS
  Administered 2019-02-09: 15 [IU] via SUBCUTANEOUS
  Administered 2019-02-09: 7 [IU] via SUBCUTANEOUS

## 2019-02-02 NOTE — Plan of Care (Signed)
POC discussed with pt and pt family. Pt daughter facetimed and was updated. Pt is A&Ox3 but needs orientation to situation (she thinks her husband can come see her).  No c/o pain, pt requested she have a little oxygen overnight so she was placed on just 2 L [place] 02. Pt afebrile and BP stable. In NSR/ST. Purewick in place and changed at 0100 due to an incontinence episode of stool leakin around rectal tube. Marginal urine output. But rectal tube in place and minimal output from that as well. Turned left/right q2 to prevent skin breakdown. Will plan to call daughter and update in AM and hope to be able to tx pt 1/22. No other issues noted

## 2019-02-02 NOTE — Progress Notes (Signed)
Pt's belongings left in security here at [facility]. [employer] called by this RN and they notified this RN that they will call [facility] security to get patient's belongings picked up.  Will follow up with [facility] security to make sure belongings were transferred to [facility].

## 2019-02-02 NOTE — Progress Notes (Signed)
Inpatient Rehabilitation-Admissions Coordinator   Consult order received. Noted pt to be transferred to [facility]. Will follow up with pt Monday to see how she is progressing. Hopeful to see OOB activity to show tolerance for CIR.   Raechel Ache, OTR/L  Rehab Admissions Coordinator  [phone] 02/02/2019 5:47 PM

## 2019-02-02 NOTE — Progress Notes (Signed)
Occupational Therapy Treatment Patient Details Name: Kaitlin [name] MRN: [id number] DOB: 1965-07-06 Today's Date: 02/02/2019    History of present illness 54 y.o. female with medical history significant of anemia, bronchitis, bilateral cataracts, chronic diastolic CHF, type 2 diabetes, hyperlipidemia, hypertension, leukocytosis, morbid obesity, peripheral neuropathy, sickle cell trait, history of thymoma with history of thymoma resection . Intubated 1/4, possible trach 01/30/19.   OT comments  Pt progressing towards established goals and presents this session with increased arousal and engagement. Pt participating in exercises of BUEs. Requiring Total A +3 for bed mobility to sit EOB and peri care at bed level. Pt requiring Total hand over hand for grooming and self feeding ice chips. Demonstrating increase attention, awareness, and problem solving; continues to require increased cues and time. SpO2 >88% on 1L throughout session.Continue to highly recommend dc to CIR and will continue to follow acutely as admitted.    Follow Up Recommendations  CIR;Supervision/Assistance - 24 hour    Equipment Recommendations  3 in 1 bedside commode ; [place]   Recommendations for Other Services Rehab consult    Precautions / Restrictions Precautions Precautions: Fall;Other (comment) Precaution Comments: morbid obesity       Mobility Bed Mobility Overal bed mobility: Needs Assistance Bed Mobility: Rolling;Supine to Sit;Sit to Supine Rolling: +2 for physical assistance;Total assist(+3 total)   Supine to sit: +2 for physical assistance;Total assist(+3 total) Sit to supine: +2 for physical assistance;Total assist(+3 total)   General bed mobility comments: Pt attempting to help, hand over hand assist to reach to roll, did initiate some trunk, but so weak help was not significant enough to count for max assist yet.  She did help progress legs to the side of the bed with air bed max inflate, started to  deflate as we came up to sitting EOB.    Transfers                      Balance Overall balance assessment: Needs assistance Sitting-balance support: Feet supported;No upper extremity supported Sitting balance-Leahy Scale: Zero Sitting balance - Comments: max to total assist seated EOB due to weakness, I could feel her pull forward, flexing her trunk at times to attempt to reposition, but it was fleeting and gone with fatigue.  Pt able to participate for >10 mins seated EOB in UE AAROM exercises and hand over hand ADLs and LE LAQs and heel cord stretches.  Postural control: Posterior lean                                 ADL either performed or assessed with clinical judgement   ADL Overall ADL's : Needs assistance/impaired Eating/Feeding: Total assistance;Sitting Eating/Feeding Details (indicate cue type and reason): Total hand over hand for pt to hold cup of ice with left hand and then bring spoon of ice to mouth with right. Second person to support at elbow and facilitate scapular movement.  Grooming: Total assistance;Bed level;Wash/dry face Grooming Details (indicate cue type and reason): Total hand over hand to bring wash clothe to face and wipe eyes                     Toileting- Clothing Manipulation and Hygiene: Total assistance;+2 for physical assistance;Bed level Toileting - Clothing Manipulation Details (indicate cue type and reason): Total A for peri care at bed level with flexiseal leaking slightly.     Functional mobility during ADLs: Total  assistance;+2 for physical assistance(+3 bed mobility only) General ADL Comments: Pt demonstrating increased engagment. Continues to be very weak.     Vision       Perception     Praxis      Cognition Arousal/Alertness: Awake/alert Behavior During Therapy: WFL for tasks assessed/performed Overall Cognitive Status: Impaired/Different from baseline Area of Impairment: Problem solving                              Problem Solving: Slow processing General Comments: Continues to present with decreased processing and problem solving. Pt more alert and maintaing conversation. Engaging in exericses, bed mobility, and phone call with her daughter.        Exercises Exercises: General Upper Extremity General Exercises - Upper Extremity Shoulder Flexion: PROM;Both;10 reps;Seated Shoulder Extension: PROM;10 reps;Both;Seated Digit Composite Flexion: AROM;Both;5 reps;Supine Composite Extension: AROM;Both;5 reps;Supine    Shoulder Instructions       General Comments O2 lowest observed was 88% on 1 L O2 Kaitlin [name], mild coughing.  no reports of lightheadedness EOB.  All other VSS.     Pertinent Vitals/ Pain       Pain Assessment: Faces Faces Pain Scale: Hurts even more Pain Location: bil UEs with ROM Pain Descriptors / Indicators: Grimacing;Guarding Pain Intervention(s): Monitored during session;Limited activity within patient's tolerance;Repositioned  Home Living                                          Prior Functioning/Environment              Frequency  Min 3X/week        Progress Toward Goals  OT Goals(current goals can now be found in the care plan section)  Progress towards OT goals: Progressing toward goals  Acute Rehab OT Goals Patient Stated Goal: to get home to her grandbabies OT Goal Formulation: With family Time For Goal Achievement: 02/12/19 Potential to Achieve Goals: Good ADL Goals Pt Will Perform Upper Body Bathing: with mod assist;bed level Additional ADL Goal #1: Pt will complete hand to mouth pattern with BUE with min A in preparation for ADL Additional ADL Goal #2: Pt will tolerate chair position x 2 hr to increase endurance for ADL  Plan Discharge plan remains appropriate    Co-evaluation    PT/OT/SLP Co-Evaluation/Treatment: Yes Reason for Co-Treatment: Complexity of the patient's impairments (multi-system  involvement);For patient/therapist safety;To address functional/ADL transfers PT goals addressed during session: Mobility/safety with mobility;Balance;Strengthening/ROM OT goals addressed during session: ADL's and self-care;Strengthening/ROM      AM-PAC OT "6 Clicks" Daily Activity     Outcome Measure   Help from another person eating meals?: A Lot Help from another person taking care of personal grooming?: A Lot Help from another person toileting, which includes using toliet, bedpan, or urinal?: Total Help from another person bathing (including washing, rinsing, drying)?: Total Help from another person to put on and taking off regular upper body clothing?: Total Help from another person to put on and taking off regular lower body clothing?: Total 6 Click Score: 8    End of Session Equipment Utilized During Treatment: Oxygen(1L)  OT Visit Diagnosis: Other abnormalities of gait and mobility (R26.89);Muscle weakness (generalized) (M62.81)   Activity Tolerance Patient tolerated treatment well   Patient Left in bed;with call bell/phone within reach;with nursing/sitter in room   Nurse Communication Mobility  status        Time: [id number] OT Time Calculation (min): 39 min  Charges: OT General Charges $OT Visit: 1 Visit OT Treatments $Self Care/Home Management : 8-22 mins  [place], OTR/L Acute Rehab Pager: [phone] Office: [place] 02/02/2019, 4:26 PM

## 2019-02-02 NOTE — Progress Notes (Signed)
  Speech Language Pathology Treatment: Dysphagia  Patient Details Name: Kaitlin [name] MRN: [id number] DOB: [birth year] Today's Date: 02/02/2019 Time: [id number] SLP Time Calculation (min) (ACUTE ONLY): 25 min  Assessment / Plan / Recommendation Clinical Impression  Kaitlin [name]'s respirations were stable at 93% with increased work of breathing (mild) following thin water trials. Timely and efficient mastication and transfer with small piece cracker. There were no instances or indications of compromised airway protection with any trials. Vocal cord adduction inferred to be greater today given increased frequency of audible verbalizations although still quite dysphonic with phonatory breaks. Cognitive status continues to be suboptimal but this should slowly improve. Will initiate upgrade to Dys 2 (minced-ground), nectar thick liquids, crush pills with full supervision and proper positioning in air mattress (reposition upright with pillows behind back).    HPI HPI: 54 y.o. female with medical history significant of anemia, bronchitis, bilateral cataracts, chronic diastolic CHF, type 2 diabetes, hyperlipidemia, hypertension, leukocytosis, morbid obesity, peripheral neuropathy, sickle cell trait, history of thymoma resection . Intubated 1/4-1/20.      SLP Plan  Continue with current plan of care       Recommendations  Diet recommendations: Dysphagia 2 (fine chop);Nectar-thick liquid Liquids provided via: Cup;Straw Medication Administration: Whole meds with puree Supervision: Full supervision/cueing for compensatory strategies;Staff to assist with self feeding Compensations: Slow rate;Small sips/bites;Lingual sweep for clearance of pocketing Postural Changes and/or Swallow Maneuvers: Seated upright 90 degrees;Upright 30-60 min after meal                Oral Care Recommendations: Oral care BID Follow up Recommendations: Inpatient Rehab SLP Visit Diagnosis: Dysphagia, unspecified (R13.10) Plan:  Continue with current plan of care       [place], Kaitlin [name] 02/02/2019, 11:43 AM  [phone] [id number]

## 2019-02-02 NOTE — Progress Notes (Addendum)
Pt being transported to [facility] via [facility]. Left icu at745pm,  This RN notified pt's daughter [name] of transfer.   Security at [employer] has belongings. Notified Lysbeth Galas, Agricultural consultant.

## 2019-02-02 NOTE — Progress Notes (Signed)
Physical Therapy Treatment Patient Details Name: Kaitlin [name] MRN: [id number] DOB: Nov 08, 1965 Today's Date: 02/02/2019    History of Present Illness 54 y.o. female with medical history significant of anemia, bronchitis, bilateral cataracts, chronic diastolic CHF, type 2 diabetes, hyperlipidemia, hypertension, leukocytosis, morbid obesity, peripheral neuropathy, sickle cell trait, history of thymoma with history of thymoma resection . Intubated 1/4, possible trach 01/30/19.    PT Comments    Pt was able to sit up EOB today for the first time since she was extubated.  She tolerated it well only limited by fatigue and pain in bil shoulders.  VSS on 2 L O2 [place] without dizziness, SOB, or signs of distress.  Cognition is normalizing.  She would benefit from intensive multidisciplinary therapy before she d/cs home with her family. PT will continue to follow acutely for safe mobility progression.  Plan to lift OOB to bariatric chair with bariatric lift next session.    Follow Up Recommendations  CIR     Equipment Recommendations  Rolling walker with 5" wheels;3in1 (PT);Wheelchair (measurements PT);Wheelchair cushion (measurements PT);Hospital bed;Other (comment)(bariatric equipment. )    Recommendations for Other Services Rehab consult     Precautions / Restrictions Precautions Precautions: Fall;Other (comment) Precaution Comments: morbid obesity    Mobility  Bed Mobility Overal bed mobility: Needs Assistance Bed Mobility: Rolling;Supine to Sit;Sit to Supine Rolling: +2 for physical assistance;Total assist(+3 total)   Supine to sit: +2 for physical assistance;Total assist(+3 total) Sit to supine: +2 for physical assistance;Total assist(+3 total)   General bed mobility comments: Pt attempting to help, hand over hand assist to reach to roll, did initiate some trunk, but so weak help was not significant enough to count for max assist yet.  She did help progress legs to the side of the bed  with air bed max inflate, started to deflate as we came up to sitting EOB.        Balance Overall balance assessment: Needs assistance Sitting-balance support: Feet supported;No upper extremity supported Sitting balance-Leahy Scale: Zero Sitting balance - Comments: max to total assist seated EOB due to weakness, I could feel her pull forward, flexing her trunk at times to attempt to reposition, but it was fleeting and gone with fatigue.  Pt able to participate for >10 mins seated EOB in UE AAROM exercises and hand over hand ADLs and LE LAQs and heel cord stretches.  Postural control: Posterior lean                                  Cognition Arousal/Alertness: Awake/alert Behavior During Therapy: WFL for tasks assessed/performed Overall Cognitive Status: Within Functional Limits for tasks assessed                                 General Comments: Cognition seems much more intact today, not specifically tested, but processing speed better, command following better to the best of her physical ability.       Exercises General Exercises - Lower Extremity Long Arc Quad: AROM;Both;10 reps    General Comments General comments (skin integrity, edema, etc.): O2 lowest observed was 88% on 2 L O2 [place], mild coughing.  no reports of lightheadedness EOB.  All other VSS.       Pertinent Vitals/Pain Pain Assessment: Faces Faces Pain Scale: Hurts even more Pain Location: bil UEs with ROM Pain Descriptors / Indicators:  Grimacing;Guarding Pain Intervention(s): Limited activity within patient's tolerance;Monitored during session;Repositioned        PT Goals (current goals can now be found in the care plan section) Acute Rehab PT Goals Patient Stated Goal: to get home to her grandbabies Progress towards PT goals: Progressing toward goals    Frequency    Min 3X/week      PT Plan Current plan remains appropriate    Co-evaluation PT/OT/SLP  Co-Evaluation/Treatment: Yes Reason for Co-Treatment: Complexity of the patient's impairments (multi-system involvement);For patient/therapist safety;To address functional/ADL transfers PT goals addressed during session: Mobility/safety with mobility;Balance;Strengthening/ROM        AM-PAC PT "6 Clicks" Mobility   Outcome Measure  Help needed turning from your back to your side while in a flat bed without using bedrails?: Total Help needed moving from lying on your back to sitting on the side of a flat bed without using bedrails?: Total Help needed moving to and from a bed to a chair (including a wheelchair)?: Total Help needed standing up from a chair using your arms (e.g., wheelchair or bedside chair)?: Total Help needed to walk in hospital room?: Total Help needed climbing 3-5 steps with a railing? : Total 6 Click Score: 6    End of Session Equipment Utilized During Treatment: Oxygen(2L O2 [place]) Activity Tolerance: Patient limited by fatigue Patient left: in bed;with call bell/phone within reach;with nursing/sitter in room   PT Visit Diagnosis: Muscle weakness (generalized) (M62.81);Difficulty in walking, not elsewhere classified (R26.2)     Time: [id number] PT Time Calculation (min) (ACUTE ONLY): 39 min  Charges:  $Therapeutic Activity: 23-37 mins                    Verdene Lennert, PT, DPT  Acute Rehabilitation [phone] pager #[phone] office  @ [place]: [phone]   02/02/2019, 3:04 PM

## 2019-02-02 NOTE — Progress Notes (Signed)
   02/02/19 2026  Vitals  Temp 98.7 F (37.1 C)  BP 140/83  MAP (mmHg) 99  BP Location Right Wrist  BP Method Automatic  Patient Position (if appropriate) Lying  Pulse Rate (!) 102  Pulse Rate Source Monitor  Oxygen Therapy  SpO2 99 %  O2 Device Nasal Cannula  O2 Flow Rate (L/min) 2 L/min  MEWS Score  MEWS Temp 0  MEWS Systolic 0  MEWS Pulse 1  MEWS RR 1  MEWS LOC 0  MEWS Score 2  MEWS Score Color Yellow  mews initiated

## 2019-02-02 NOTE — Plan of Care (Signed)
Patient on 1 L Reader or RA throughout shift. Worked with PT today. Sat at edge of bed. Tolerated well. VSS. Transfer to [facility] when transport is available; report given.    Problem: Education: Goal: Knowledge of General Education information will improve Description: Including pain rating scale, medication(s)/side effects and non-pharmacologic comfort measures Outcome: Progressing   Problem: Health Behavior/Discharge Planning: Goal: Ability to manage health-related needs will improve Outcome: Progressing   Problem: Clinical Measurements: Goal: Ability to maintain clinical measurements within normal limits will improve Outcome: Progressing Goal: Will remain free from infection Outcome: Progressing Goal: Diagnostic test results will improve Outcome: Progressing Goal: Respiratory complications will improve Outcome: Progressing Goal: Cardiovascular complication will be avoided Outcome: Progressing   Problem: Activity: Goal: Risk for activity intolerance will decrease Outcome: Progressing   Problem: Nutrition: Goal: Adequate nutrition will be maintained Outcome: Progressing   Problem: Coping: Goal: Level of anxiety will decrease Outcome: Progressing   Problem: Elimination: Goal: Will not experience complications related to bowel motility Outcome: Progressing Goal: Will not experience complications related to urinary retention Outcome: Progressing   Problem: Pain Managment: Goal: General experience of comfort will improve Outcome: Progressing   Problem: Safety: Goal: Ability to remain free from injury will improve Outcome: Progressing   Problem: Skin Integrity: Goal: Risk for impaired skin integrity will decrease Outcome: Progressing

## 2019-02-02 NOTE — Progress Notes (Signed)
Updated patient's daughter over the phone. 

## 2019-02-02 NOTE — Progress Notes (Signed)
Inpatient Diabetes Program Recommendations  AACE/ADA: New Consensus Statement on Inpatient Glycemic Control (2015)  Target Ranges:  Prepandial:   less than 140 mg/dL      Peak postprandial:   less than 180 mg/dL (1-2 hours)      Critically ill patients:  140 - 180 mg/dL   Lab Results  Component Value Date   GLUCAP 109 (H) 02/02/2019   HGBA1C 7.1 (H) 01/13/2019    Review of Glycemic Control Results for MEESHA, BROMAN (MRN [id number]) as of 02/02/2019 12:08  Ref. Range 02/01/2019 03:28 02/01/2019 07:35 02/01/2019 12:29 02/01/2019 16:46 02/02/2019 07:58  Glucose-Capillary Latest Ref Range: 70 - 99 mg/dL 85 87 185 (H) 185 (H) 109 (H)  Diabetes history: DM 2 Outpatient Diabetes medications: Insulin pump-Medtronic insulin pump Average total insulin daily 152 units/day (basal insulin=78 units daily)-per note from Dr. Tamala Julian Endocrinology in 08/2018 12 am - 2.5 units/hr, 8 am- 3.5 units/hr, 12 pm- 3.5 units/hr, 4 pm- 3.75 units/hr, 10 pm- 3.75 units/hr. Her insulin to carb ratio is now 1:5 and her sensitivity is 1:25. Ozempic 0.25 weekly Current orders for Inpatient glycemic control:  Levemir 30 units daily Novolog 10 units q 4 hours Novolog resistant q 4 hours  Inpatient Diabetes Program Recommendations:   Per RN, patient is no longer receiving tube feeds.   -Please change Novolog correction to tid with meals and HS scale.  -D/C Novolog tube feed coverage 10 units q 4 hours.  -Add Novolog 8 units tid with meals (hold if intake<50% or NPO).  Thanks,  Adah Perl, RN, BC-ADM Inpatient Diabetes Coordinator Pager [phone] (8a-5p)

## 2019-02-02 NOTE — Progress Notes (Signed)
PROGRESS NOTE  Kaitlin [name]  [id number] DOB: 1965/10/13 DOA: 01/12/2019 PCP: [place]  Brief Narrative: Kaitlin [name] is a 54 y.o. female with a history of chronic HFpEF, T2DM, HTN, morbid obesity, sickle cell trait, and thymoma recently resected who presented on 01/12/2019 to the ED with worsening dyspnea, cough, headache, and sore throat found to be hypoxic with bilateral CXR infiltrates and positive SARS-CoV-2 antigen testing. She was admitted to [facility] with remdesivir and steroids, subsequently getting tocilizumab 1/2 and convalescent plasma 1/4 when hypoxemia worsened from 8L > 15L requirement, hypercarbia and unresponsiveness developed, and the patient was intubated. Echocardiogram showed depressed LVEF to 45%-50% and diuresis was initiated. Antibiotics were added, then broadened due to shock requiring pressors and stress steroids. Central line was ultimately removed as well, though cultures remained negative and antibiotics were discontinued. Vent support was weaned to pressure support 1/16 and subsequently the patient was extubated 1/20 and has since weaned off of supplemental oxygen. PT/OT have recommended CIR rehabilitation, PM&R consulted. SLP has continued working with the patient and making dietary recommendations. Having completed 21 days of therapy, sustaining improvement, since positive covid testing, the patient's isolation may be discontinued. The patient is therefore to be transferred to [facility].  Assessment & Plan: Principal Problem:   Acute respiratory distress syndrome (ARDS) due to COVID-19 virus [facility]) Active Problems:   Hypertension   HLD (hyperlipidemia)   Insulin dependent type 2 diabetes mellitus ([place])   Encounter for central line placement   Grade I diastolic dysfunction   Hypotension   Acute respiratory failure with hypoxemia (HCC)   Shock circulatory (HCC)   Septic shock (HCC)   Hypernatremia  Acute hypoxemic respiratory failure due to  ARDS due to covid-19 pneumonia: SARS-CoV-2 Ag positive on 1/1.  - Wean supplemental oxygen as able. Was weaned to pressure support 1/16, extubated 1/20, on room air this morning. Afebrile. Goal SpO2 85-95% - continue incentive spirometry - Positive test was 21 days ago, pt has improved, last steroids > 1 week ago. Ok to DC isolation.  - Completed remdesivir x5 days, steroids x10 days, received tocilizumab 1/2, convalescent plasma 1/4. - Enoxaparin to return to standard weight-based prophylactic dose.   Dysphagia:  - Continue SLP evaluations, dysphagia diet  Acute HFrEF, suspected to be due to sepsis/covid-related cardiomyopathy, HTN:  - Appears euvolemic currently, will monitor I/O, daily weights.  - Note pt previously on lasix 40mg  po BID though not recently filled.  - Will need cardiology evaluation/follow up. - Continue ASA, statin, beta blocker (increase dose w/tachycardia and rising BPs), aim to restart ACE/ARB soon.  Shock, cardiogenic and septic: Weaned from pressors, now restarting antihypertensives, cultures negative. Central line removed 1/14. Volume status is stable.  - Resolved.  Poorly-controlled IDT2DM with insulin pump as outpatient:  - Continue levemir, change CBGs and novolog to AC/HS, add mealtime insulin and monitor. Showing overall reassuring trend since off steroids.  Muscular deconditioning: Severe.  - PT/OT, CIR would be ideal setting for rehabilitation.   Morbid obesity: BMI 53. Noted.   Nasal abrasion: No evidence of infection - Continue ointment, offloading  Hypernatremia:   Hypokalemia:  - Supplement as indicated and monitor  Anemia of critical illness:  - No bleeding noted, continue monitoring  DVT prophylaxis: Lovenox 0.5mg /kg q24h Code Status: Full Family Communication: Daughter by phone this afternoon. Disposition Plan: Transfer to non-covid floor. Needs ongoing PT/OT. CIR consulted for placement.  Consultants:   PCCM  ID: discussed case  with Dr. Baxter Flattery by  phone 02/02/2019 for recommendations regarding isolation.  PM&R  Procedures:  ETT 1/4 >> 1/20 L IJ CVL 1/5 >> 1/14 Arterial R radial 1/6 >> 1/13  Antimicrobials/Covid therapies: Remdesivir 1/1 >> 1/6  Tocilizumab 1/2 x1  Decadron 1/1 >> to stress dose steroids 1/7 >> stopped 1/11  Vanco 1/4 >> 1/10  Cefepime 1/4 >> 1/7 Meropenem 1/7 >> 1/10  Cipro 1/7 >> 1/10  Subjective: Feels very tired diffusely, weak, with intermittent diffuse numbness. No chest pain, leg swelling. Taking po.   Objective: Vitals:   02/02/19 0500 02/02/19 0600 02/02/19 0700 02/02/19 0800  BP: (!) 150/94 132/90 (!) 144/90 (!) 142/82  Pulse: (!) 119 (!) 119 (!) 120 (!) 120  Resp:      Temp:    98.8 F (37.1 C)  TempSrc:    Oral  SpO2: 92% 92% 91% 92%  Weight: (!) 164.2 kg     Height:        Intake/Output Summary (Last 24 hours) at 02/02/2019 1209 Last data filed at 02/02/2019 0800 Gross per 24 hour  Intake 1540 ml  Output 1225 ml  Net 315 ml   Filed Weights   01/29/19 0450 01/30/19 0500 02/02/19 0500  Weight: (!) 172 kg (!) 165 kg (!) 164.2 kg    Gen: 54 y.o. female in no distress, very hoarse Pulm: Non-labored breathing room air. Clear to auscultation bilaterally.  CV: Regular rate (~100) and rhythm. No murmur, rub, or gallop. No JVD, no pedal edema. GI: Abdomen soft, non-tender, non-distended, with normoactive bowel sounds. No organomegaly or masses felt. Ext: Warm, no deformities Skin: Nasal abrasion without erythema or exudate. No other rashes, lesions or ulcers on visualized skin Neuro: Alert and oriented. 1/5 strength bilateral arms, sensation intact. 2/5 strength bilateral legs w/intact sensation. Psych: Judgement and insight appear normal. Mood & affect appropriate.   Data Reviewed: I have personally reviewed following labs and imaging studies  CBC: Recent Labs  Lab 01/27/19 0535 01/28/19 0500 01/29/19 0406 01/31/19 0125 02/01/19 0541  WBC 17.6* 18.4* 15.0*  13.3* 11.3*  NEUTROABS  --   --   --  9.0*  --   HGB 13.1 13.4 11.8* 9.8* 11.0*  HCT 41.5 42.0 38.2 30.9* 34.8*  MCV 83.2 83.0 85.3 84.0 83.1  PLT 159 169 136* 122* 0000000   Basic Metabolic Panel: Recent Labs  Lab 01/28/19 0500 01/29/19 0406 01/31/19 0125 02/01/19 0541 02/02/19 0258  NA 150* 150* 146* 148* 148*  K 3.2* 3.5 3.2* 3.3* 3.8  CL 104 109 107 105 109  CO2 35* 34* 32 31 29  GLUCOSE 257* 192* 179* 81 70  BUN 29* 33* 24* 15 14  CREATININE 0.79 0.91 0.56 0.47 0.59  CALCIUM 8.9 8.5* 8.5* 8.7* 8.9  MG  --  2.7*  --   --  2.4  PHOS  --  2.4*  --   --   --    GFR: Estimated Creatinine Clearance: 138.4 mL/min (by C-G formula based on SCr of 0.59 mg/dL). Liver Function Tests: Recent Labs  Lab 01/27/19 0535 01/28/19 0500 01/29/19 0406  AST 61* 76* 53*  ALT 115* 107* 83*  ALKPHOS 70 76 60  BILITOT 0.8 0.8 0.7  PROT 5.4* 5.4* 5.3*  ALBUMIN 2.7* 2.8* 2.6*   No results for input(s): LIPASE, AMYLASE in the last 168 hours. No results for input(s): AMMONIA in the last 168 hours. Coagulation Profile: Recent Labs  Lab 01/31/19 0125  INR 1.2   Cardiac Enzymes: No results for input(s):  CKTOTAL, CKMB, CKMBINDEX, TROPONINI in the last 168 hours. BNP (last 3 results) No results for input(s): PROBNP in the last 8760 hours. HbA1C: No results for input(s): HGBA1C in the last 72 hours. CBG: Recent Labs  Lab 02/01/19 0328 02/01/19 0735 02/01/19 1229 02/01/19 1646 02/02/19 0758  GLUCAP 85 87 185* 185* 109*   Lipid Profile: Recent Labs    01/31/19 0125  TRIG 208*   Thyroid Function Tests: No results for input(s): TSH, T4TOTAL, FREET4, T3FREE, THYROIDAB in the last 72 hours. Anemia Panel: No results for input(s): VITAMINB12, FOLATE, FERRITIN, TIBC, IRON, RETICCTPCT in the last 72 hours. Urine analysis:    Component Value Date/Time   COLORURINE YELLOW 01/31/2013 1138   APPEARANCEUR CLEAR 01/31/2013 1138   LABSPEC 1.029 01/31/2013 1138   PHURINE 6.0 01/31/2013  1138   GLUCOSEU NEG 01/31/2013 1138   HGBUR NEG 01/31/2013 1138   BILIRUBINUR NEG 01/31/2013 1138   KETONESUR NEG 01/31/2013 1138   PROTEINUR NEG 01/31/2013 1138   UROBILINOGEN 0.2 01/31/2013 1138   NITRITE NEG 01/31/2013 1138   LEUKOCYTESUR NEG 01/31/2013 1138   No results found for this or any previous visit (from the past 240 hour(s)).    Radiology Studies: No results found.  Scheduled Meds: . vitamin C  500 mg Per Tube Daily  . aspirin  81 mg Per Tube Daily  . carvedilol  3.125 mg Per Tube BID WC  . chlorhexidine  15 mL Mouth/Throat BID  . Chlorhexidine Gluconate Cloth  6 each Topical Daily  . enoxaparin (LOVENOX) injection  85 mg Subcutaneous Q12H  . insulin aspart  0-20 Units Subcutaneous Q4H  . insulin aspart  10 Units Subcutaneous Q4H  . insulin detemir  30 Units Subcutaneous Daily  . neomycin-bacitracin-polymyxin   Topical Daily  . rosuvastatin  40 mg Oral Daily  . cholecalciferol  1,000 Units Per Tube Daily  . zinc sulfate  220 mg Per Tube Daily   Continuous Infusions:   LOS: 21 days   Time spent: 35 minutes.  Patrecia Pour, MD [facility] [email or web address] 02/02/2019, 12:09 PM

## 2019-02-03 ENCOUNTER — Inpatient Hospital Stay (HOSPITAL_COMMUNITY): Payer: Medicaid Other

## 2019-02-03 LAB — GLUCOSE, CAPILLARY
Glucose-Capillary: 167 mg/dL — ABNORMAL HIGH (ref 70–99)
Glucose-Capillary: 180 mg/dL — ABNORMAL HIGH (ref 70–99)
Glucose-Capillary: 186 mg/dL — ABNORMAL HIGH (ref 70–99)
Glucose-Capillary: 197 mg/dL — ABNORMAL HIGH (ref 70–99)
Glucose-Capillary: 245 mg/dL — ABNORMAL HIGH (ref 70–99)

## 2019-02-03 LAB — BRAIN NATRIURETIC PEPTIDE: B Natriuretic Peptide: 45.5 pg/mL (ref 0.0–100.0)

## 2019-02-03 MED ORDER — ENOXAPARIN SODIUM 80 MG/0.8ML ~~LOC~~ SOLN
80.0000 mg | SUBCUTANEOUS | Status: DC
  Administered 2019-02-04 – 2019-02-09 (×6): 80 mg via SUBCUTANEOUS
  Filled 2019-02-03 (×6): qty 0.8

## 2019-02-03 MED ORDER — POLYVINYL ALCOHOL 1.4 % OP SOLN
1.0000 [drp] | Freq: Four times a day (QID) | OPHTHALMIC | Status: DC | PRN
  Administered 2019-02-04 – 2019-02-08 (×4): 1 [drp] via OPHTHALMIC
  Filled 2019-02-03: qty 15

## 2019-02-03 MED ORDER — FUROSEMIDE 10 MG/ML IJ SOLN
INTRAMUSCULAR | Status: AC
  Administered 2019-02-03: 80 mg
  Filled 2019-02-03: qty 4

## 2019-02-03 MED ORDER — HYPROMELLOSE (GONIOSCOPIC) 2.5 % OP SOLN: 1.0000 [drp] | Freq: Four times a day (QID) | OPHTHALMIC | Status: DC | PRN

## 2019-02-03 MED ORDER — FUROSEMIDE 10 MG/ML IJ SOLN: 80.0000 mg | Freq: Once | INTRAMUSCULAR | Status: AC

## 2019-02-03 MED ORDER — ASPIRIN 81 MG PO CHEW
81.0000 mg | CHEWABLE_TABLET | Freq: Every day | ORAL | Status: DC
  Administered 2019-02-04 – 2019-02-09 (×6): 81 mg via ORAL
  Filled 2019-02-03 (×6): qty 1

## 2019-02-03 NOTE — Progress Notes (Signed)
Corrected patient's diet order to include carb modified dietary choices due to patient having diagnosis of DM. Confirmed diet changes with nutritional services.

## 2019-02-03 NOTE — Progress Notes (Signed)
PROGRESS NOTE  Kaitlin [name]  [id number] DOB: 1965/04/08 DOA: 01/12/2019 PCP: [place]  Brief Narrative: Kaitlin [name] is a 54 year old African-American female, morbidly obese, with past medical history significant for chronic HFpEF, T2DM, HTN, sickle cell trait and thymoma, recently resected.  Patient presented on 01/12/2019 to the ED with worsening dyspnea, cough, headache, and sore throat, found to be hypoxic with bilateral CXR infiltrates and positive SARS-CoV-2 antigen testing. Patient was admitted to [facility], treated with remdesivir and steroids, subsequently, tocilizumab 1/2 and convalescent plasma 1/4 when hypoxemia worsened from 8L > 15L requirement, hypercarbia and unresponsiveness developed, and the patient was intubated. Echocardiogram showed depressed LVEF to 45%-50% and diuresis was initiated. Antibiotics were added, then broadened due to shock requiring pressors and stress steroids. Central line was ultimately removed as well, though cultures remained negative and antibiotics were discontinued. Vent support was weaned to pressure support 1/16 and subsequently the patient was extubated 1/20 and has since weaned off of supplemental oxygen. PT/OT have recommended CIR rehabilitation, PM&R consulted. SLP has continued working with the patient and making dietary recommendations. Having completed 21 days of therapy, sustaining improvement, since positive covid testing, the patient's isolation may be discontinued. The patient is therefore to be transferred to [facility].  02/03/2019: Patient seen.  Patient becomes dyspneic while speaking.  Nursing staff is worried that this may be anxiety.  We will get a chest x-ray and will check cardiac BNP.  We will also give patient 1 dose of IV Lasix.  Further management will depend on above.  Assessment & Plan: Principal Problem:   Acute respiratory distress syndrome (ARDS) due to COVID-19 virus [facility]) Active Problems:   Hypertension  HLD (hyperlipidemia)   Insulin dependent type 2 diabetes mellitus ([place])   Encounter for central line placement   Grade I diastolic dysfunction   Hypotension   Acute respiratory failure with hypoxemia (HCC)   Shock circulatory (HCC)   Septic shock (HCC)   Hypernatremia  Acute hypoxemic respiratory failure due to ARDS due to covid-19 pneumonia: SARS-CoV-2 Ag positive on 1/1.  - Wean supplemental oxygen as able. Was weaned to pressure support 1/16, extubated 1/20, on room air this morning. Afebrile. Goal SpO2 85-95% - continue incentive spirometry - Positive test was 21 days ago, pt has improved, last steroids > 1 week ago. Ok to DC isolation.  - Completed remdesivir x5 days, steroids x10 days, received tocilizumab 1/2, convalescent plasma 1/4. - Enoxaparin to return to standard weight-based prophylactic dose.  02/03/2019: Continue to monitor respiratory symptoms closely.  Pursue chest x-ray, check cardiac BNP.  Low threshold to repeat ABG.  Will give 1 dose of IV Lasix.  Dysphagia:  - Continue SLP evaluations, dysphagia diet  Acute HFrEF, suspected to be due to sepsis/covid-related cardiomyopathy, HTN:  - Appears euvolemic currently, will monitor I/O, daily weights.  - Note pt previously on lasix 40mg  po BID though not recently filled.  - Will need cardiology evaluation/follow up. - Continue ASA, statin, beta blocker (increase dose w/tachycardia and rising BPs), aim to restart ACE/ARB soon.  Shock, cardiogenic and septic: Weaned from pressors, now restarting antihypertensives, cultures negative. Central line removed 1/14. Volume status is stable.  - Resolved.  Poorly-controlled IDT2DM with insulin pump as outpatient:  - Continue levemir, change CBGs and novolog to AC/HS, add mealtime insulin and monitor. Showing overall reassuring trend since off steroids.  Muscular deconditioning: Severe.  - PT/OT, CIR would be ideal setting for rehabilitation.  02/03/2019: Patient has critical  illness  myopathy.  Patient is awaiting rehab.  Morbid obesity: BMI 53. Noted.   Hypernatremia:  02/03/2019: Mild and stable.  Repeat BMP in the morning.  Hypokalemia:  - Supplement as indicated and monitor 02/03/2019: Resolved.  Continue to monitor.  Last potassium was 3.8.  Repeat renal panel in the morning.   Anemia of critical illness:  - No bleeding noted, continue monitoring  DVT prophylaxis: Lovenox 0.5mg /kg q24h Code Status: Full Family Communication: Daughter by phone this afternoon. Disposition Plan: Transfer to non-covid floor. Needs ongoing PT/OT. CIR consulted for placement.  Consultants:   PCCM  ID: discussed case with Dr. Baxter Flattery by phone 02/02/2019 for recommendations regarding isolation.  PM&R  Procedures:  ETT 1/4 >> 1/20 L IJ CVL 1/5 >> 1/14 Arterial R radial 1/6 >> 1/13  Antimicrobials/Covid therapies: Remdesivir 1/1 >> 1/6  Tocilizumab 1/2 x1  Decadron 1/1 >> to stress dose steroids 1/7 >> stopped 1/11  Vanco 1/4 >> 1/10  Cefepime 1/4 >> 1/7 Meropenem 1/7 >> 1/10  Cipro 1/7 >> 1/10  Subjective: Weak Speaking makes patient SOB.  Objective: Vitals:   02/03/19 0430 02/03/19 0500 02/03/19 0826 02/03/19 1043  BP: (!) 141/79  (!) 150/94 (!) 147/87  Pulse: (!) 107  (!) 102 (!) 102  Resp: 20  (!) 30 (!) 24  Temp: 98.5 F (36.9 C)  99.1 F (37.3 C) 98.6 F (37 C)  TempSrc: Oral  Oral Oral  SpO2: 96%  97% 99%  Weight:  (!) 164.3 kg    Height:        Intake/Output Summary (Last 24 hours) at 02/03/2019 1053 Last data filed at 02/03/2019 0657 Gross per 24 hour  Intake 600 ml  Output 500 ml  Net 100 ml   Filed Weights   01/30/19 0500 02/02/19 0500 02/03/19 0500  Weight: (!) 165 kg (!) 164.2 kg (!) 164.3 kg    Gen: Morbidly obese. Myopathy (guess from illness). SOB with speaking Pulm: Decreased air entry  CV: S1S2 GI: Abdomen soft, non-tender, non-distended, with normoactive bowel sounds. No organomegaly or masses felt. Ext: Warm, no  deformities.  Edema of upper and lower extremities Skin: Nasal abrasion without erythema or exudate. No other rashes, lesions or ulcers on visualized skin Neuro: Awake and alert.  Follow-up proximal extremities his 1/5.  Power lower extremities is 1+ to 2/5.   Data Reviewed: I have personally reviewed following labs and imaging studies  CBC: Recent Labs  Lab 01/28/19 0500 01/29/19 0406 01/31/19 0125 02/01/19 0541  WBC 18.4* 15.0* 13.3* 11.3*  NEUTROABS  --   --  9.0*  --   HGB 13.4 11.8* 9.8* 11.0*  HCT 42.0 38.2 30.9* 34.8*  MCV 83.0 85.3 84.0 83.1  PLT 169 136* 122* 0000000   Basic Metabolic Panel: Recent Labs  Lab 01/28/19 0500 01/29/19 0406 01/31/19 0125 02/01/19 0541 02/02/19 0258  NA 150* 150* 146* 148* 148*  K 3.2* 3.5 3.2* 3.3* 3.8  CL 104 109 107 105 109  CO2 35* 34* 32 31 29  GLUCOSE 257* 192* 179* 81 70  BUN 29* 33* 24* 15 14  CREATININE 0.79 0.91 0.56 0.47 0.59  CALCIUM 8.9 8.5* 8.5* 8.7* 8.9  MG  --  2.7*  --   --  2.4  PHOS  --  2.4*  --   --   --    GFR: Estimated Creatinine Clearance: 138.4 mL/min (by C-G formula based on SCr of 0.59 mg/dL). Liver Function Tests: Recent Labs  Lab 01/28/19 0500 01/29/19 0406  AST 76* 53*  ALT 107* 83*  ALKPHOS 76 60  BILITOT 0.8 0.7  PROT 5.4* 5.3*  ALBUMIN 2.8* 2.6*   No results for input(s): LIPASE, AMYLASE in the last 168 hours. No results for input(s): AMMONIA in the last 168 hours. Coagulation Profile: Recent Labs  Lab 01/31/19 0125  INR 1.2   Cardiac Enzymes: No results for input(s): CKTOTAL, CKMB, CKMBINDEX, TROPONINI in the last 168 hours. BNP (last 3 results) No results for input(s): PROBNP in the last 8760 hours. HbA1C: No results for input(s): HGBA1C in the last 72 hours. CBG: Recent Labs  Lab 02/02/19 1641 02/02/19 2028 02/03/19 0052 02/03/19 0432 02/03/19 0751  GLUCAP 142* 138* 180* 167* 197*   Lipid Profile: No results for input(s): CHOL, HDL, LDLCALC, TRIG, CHOLHDL, LDLDIRECT in  the last 72 hours. Thyroid Function Tests: No results for input(s): TSH, T4TOTAL, FREET4, T3FREE, THYROIDAB in the last 72 hours. Anemia Panel: No results for input(s): VITAMINB12, FOLATE, FERRITIN, TIBC, IRON, RETICCTPCT in the last 72 hours. Urine analysis:    Component Value Date/Time   COLORURINE YELLOW 01/31/2013 1138   APPEARANCEUR CLEAR 01/31/2013 1138   LABSPEC 1.029 01/31/2013 1138   PHURINE 6.0 01/31/2013 1138   GLUCOSEU NEG 01/31/2013 1138   HGBUR NEG 01/31/2013 1138   BILIRUBINUR NEG 01/31/2013 1138   KETONESUR NEG 01/31/2013 1138   PROTEINUR NEG 01/31/2013 1138   UROBILINOGEN 0.2 01/31/2013 1138   NITRITE NEG 01/31/2013 1138   LEUKOCYTESUR NEG 01/31/2013 1138   No results found for this or any previous visit (from the past 240 hour(s)).    Radiology Studies: No results found.  Scheduled Meds: . vitamin C  500 mg Per Tube Daily  . aspirin  81 mg Per Tube Daily  . carvedilol  6.25 mg Per Tube BID WC  . chlorhexidine  15 mL Mouth/Throat BID  . Chlorhexidine Gluconate Cloth  6 each Topical Daily  . enoxaparin (LOVENOX) injection  85 mg Subcutaneous Q12H  . insulin aspart  0-20 Units Subcutaneous TID WC  . insulin aspart  10 Units Subcutaneous TID WC  . insulin detemir  30 Units Subcutaneous Daily  . neomycin-bacitracin-polymyxin   Topical Daily  . rosuvastatin  40 mg Oral Daily  . cholecalciferol  1,000 Units Per Tube Daily  . zinc sulfate  220 mg Per Tube Daily   Continuous Infusions:   LOS: 22 days   Time spent: 35 minutes.  Bonnell Public, MD [facility] [email or web address] 02/03/2019, 10:53 AM

## 2019-02-04 LAB — RENAL FUNCTION PANEL
Albumin: 2.8 g/dL — ABNORMAL LOW (ref 3.5–5.0)
Anion gap: 10 (ref 5–15)
BUN: 14 mg/dL (ref 6–20)
CO2: 27 mmol/L (ref 22–32)
Calcium: 8.9 mg/dL (ref 8.9–10.3)
Chloride: 103 mmol/L (ref 98–111)
Creatinine, Ser: 0.66 mg/dL (ref 0.44–1.00)
GFR calc Af Amer: >60 mL/min (ref >60)
GFR calc non Af Amer: >60 mL/min (ref >60)
Glucose, Bld: 215 mg/dL — ABNORMAL HIGH (ref 70–99)
Phosphorus: 4.2 mg/dL (ref 2.5–4.6)
Potassium: 3.9 mmol/L (ref 3.5–5.1)
Sodium: 140 mmol/L (ref 135–145)

## 2019-02-04 LAB — CBC WITH DIFFERENTIAL/PLATELET
Abs Immature Granulocytes: 0.05 10*3/uL (ref 0.00–0.07)
Basophils Absolute: 0 10*3/uL (ref 0.0–0.1)
Basophils Relative: 0 %
Eosinophils Absolute: 0 10*3/uL (ref 0.0–0.5)
Eosinophils Relative: 0 %
HCT: 36.1 % (ref 36.0–46.0)
Hemoglobin: 11.5 g/dL — ABNORMAL LOW (ref 12.0–15.0)
Immature Granulocytes: 1 %
Lymphocytes Relative: 42 %
Lymphs Abs: 3.8 10*3/uL (ref 0.7–4.0)
MCH: 27 pg (ref 26.0–34.0)
MCHC: 31.9 g/dL (ref 30.0–36.0)
MCV: 84.7 fL (ref 80.0–100.0)
Monocytes Absolute: 0.8 10*3/uL (ref 0.1–1.0)
Monocytes Relative: 9 %
Neutro Abs: 4.3 10*3/uL (ref 1.7–7.7)
Neutrophils Relative %: 48 %
Platelets: 295 10*3/uL (ref 150–400)
RBC: 4.26 MIL/uL (ref 3.87–5.11)
RDW: 22.2 % — ABNORMAL HIGH (ref 11.5–15.5)
WBC: 8.9 10*3/uL (ref 4.0–10.5)
nRBC: 0 % (ref 0.0–0.2)

## 2019-02-04 LAB — BLOOD GAS, ARTERIAL
Acid-Base Excess: 3.2 mmol/L — ABNORMAL HIGH (ref 0.0–2.0)
Bicarbonate: 26.3 mmol/L (ref 20.0–28.0)
FIO2: 2
O2 Saturation: 95.6 %
Patient temperature: 98.2
pCO2 arterial: 36.1 mmHg (ref 32.0–48.0)
pH, Arterial: 7.473 — ABNORMAL HIGH (ref 7.350–7.450)
pO2, Arterial: 80.7 mmHg — ABNORMAL LOW (ref 83.0–108.0)

## 2019-02-04 LAB — MAGNESIUM: Magnesium: 2.2 mg/dL (ref 1.7–2.4)

## 2019-02-04 LAB — GLUCOSE, CAPILLARY
Glucose-Capillary: 218 mg/dL — ABNORMAL HIGH (ref 70–99)
Glucose-Capillary: 268 mg/dL — ABNORMAL HIGH (ref 70–99)
Glucose-Capillary: 243 mg/dL — ABNORMAL HIGH (ref 70–99)
Glucose-Capillary: 215 mg/dL — ABNORMAL HIGH (ref 70–99)

## 2019-02-04 MED ORDER — LEVALBUTEROL TARTRATE 45 MCG/ACT IN AERO: 2.0000 | INHALATION_SPRAY | Freq: Four times a day (QID) | RESPIRATORY_TRACT | Status: DC | PRN

## 2019-02-04 MED ORDER — ALBUTEROL SULFATE (2.5 MG/3ML) 0.083% IN NEBU: 2.5000 mg | INHALATION_SOLUTION | Freq: Four times a day (QID) | RESPIRATORY_TRACT | Status: DC | PRN

## 2019-02-04 MED ORDER — ACETAMINOPHEN 160 MG/5ML PO SOLN
650.0000 mg | Freq: Four times a day (QID) | ORAL | Status: DC | PRN
  Administered 2019-02-04 – 2019-02-09 (×11): 650 mg via ORAL
  Filled 2019-02-04 (×11): qty 20.3

## 2019-02-04 MED ORDER — CARVEDILOL 12.5 MG PO TABS
12.5000 mg | ORAL_TABLET | Freq: Two times a day (BID) | ORAL | Status: DC
  Administered 2019-02-04 – 2019-02-09 (×9): 12.5 mg via ORAL
  Filled 2019-02-04 (×10): qty 1

## 2019-02-04 MED ORDER — TORSEMIDE 20 MG PO TABS
20.0000 mg | ORAL_TABLET | Freq: Every day | ORAL | Status: DC
  Filled 2019-02-04: qty 1

## 2019-02-04 MED ORDER — TORSEMIDE 20 MG PO TABS
20.0000 mg | ORAL_TABLET | Freq: Two times a day (BID) | ORAL | Status: DC
  Administered 2019-02-04 – 2019-02-09 (×9): 20 mg via ORAL
  Filled 2019-02-04 (×11): qty 1

## 2019-02-04 NOTE — Progress Notes (Signed)
   Vital Signs MEWS/VS Documentation      02/03/2019 2015 02/03/2019 2324 02/03/2019 2335 02/04/2019 0605   MEWS Score:  1  1  --  2   MEWS Score Color:  Green  Green  --  Yellow   Resp:  --  (!) 25  --  (!) 21   Pulse:  --  96  --  (!) 108   BP:  --  135/76  --  116/86   Temp:  --  99.1 F (37.3 C)  --  98.8 F (37.1 C)   O2 Device:  --  Nasal Cannula  Nasal Cannula  Nasal Cannula   O2 Flow Rate (L/min):  --  2 L/min  2 L/min  --   Level of Consciousness:  Alert  --  --  --      Pt alert and oriented. Yellow MEWS implemented due to resp 21 and HR 108. MD notified.     Mee Hives Mae 02/04/2019,7:39 AM

## 2019-02-04 NOTE — Progress Notes (Signed)
Received inbound call from attending physician. New orders received for EKG and ABG.

## 2019-02-04 NOTE — Progress Notes (Signed)
Notified the lab abg was sent

## 2019-02-04 NOTE — Progress Notes (Signed)
PROGRESS NOTE  Kaitlin [name]  [id number] DOB: 02-Feb-1965 DOA: 01/12/2019 PCP: [place]  Brief Narrative: Kaitlin [name] is a 54 year old African-American female, morbidly obese, with past medical history significant for chronic HFpEF, T2DM, HTN, sickle cell trait and thymoma, recently resected.  Patient presented on 01/12/2019 to the ED with worsening dyspnea, cough, headache, and sore throat, found to be hypoxic with bilateral CXR infiltrates and positive SARS-CoV-2 antigen testing. Patient was admitted to [facility], treated with remdesivir and steroids, subsequently, tocilizumab 1/2 and convalescent plasma 1/4 when hypoxemia worsened from 8L > 15L requirement, hypercarbia and unresponsiveness developed, and the patient was intubated. Echocardiogram showed depressed LVEF to 45%-50% and diuresis was initiated. Antibiotics were added, then broadened due to shock requiring pressors and stress steroids. Central line was ultimately removed as well, though cultures remained negative and antibiotics were discontinued. Vent support was weaned to pressure support 1/16 and subsequently the patient was extubated 1/20 and has since weaned off of supplemental oxygen. PT/OT have recommended CIR rehabilitation, PM&R consulted. SLP has continued working with the patient and making dietary recommendations. Having completed 21 days of therapy, sustaining improvement, since positive covid testing, the patient's isolation may be discontinued. The patient is therefore to be transferred to [facility].  02/03/2019: Patient seen.  Patient becomes dyspneic while speaking.  Nursing staff is worried that this may be anxiety.  We will get a chest x-ray and will check cardiac BNP.  We will also give patient 1 dose of IV Lasix.  Further management will depend on above.  02/04/2019: Patient seen.  Tachycardia reported.  ABG and EKG done, results reviewed.  EKG showed sinus tachycardia.  The revealed pH of 7.47, PCO2 of  36, PO2 of 80.  Edema of the extremities improved significantly.  On further questioning, patient informed me that she was on diuretics daily prior to admission.  Will start patient on oral torsemide daily.  Continue to monitor renal function.  Critical illness myopathy persists.  Assessment & Plan: Principal Problem:   Acute respiratory distress syndrome (ARDS) due to COVID-19 virus [facility]) Active Problems:   Hypertension   HLD (hyperlipidemia)   Insulin dependent type 2 diabetes mellitus ([place])   Encounter for central line placement   Grade I diastolic dysfunction   Hypotension   Acute respiratory failure with hypoxemia (HCC)   Shock circulatory (HCC)   Septic shock (HCC)   Hypernatremia  Acute hypoxemic respiratory failure due to ARDS due to covid-19 pneumonia:  -SARS-CoV-2 Ag positive on 1/1.  - Wean supplemental oxygen as able. Was weaned to pressure support 1/16, extubated 1/20, on room air this morning. Afebrile. Goal SpO2 85-95% - continue incentive spirometry - Positive test was 21 days ago, pt has improved, last steroids > 1 week ago. Ok to DC isolation.  - Completed remdesivir x5 days, steroids x10 days, received tocilizumab 1/2, convalescent plasma 1/4. - Enoxaparin to return to standard weight-based prophylactic dose.  02/03/2019: Continue to monitor respiratory symptoms closely.  Pursue chest x-ray, check cardiac BNP.  Low threshold to repeat ABG.  Will give 1 dose of IV Lasix. 02/04/2019: Kindly see above.  Dysphagia:   - Continue SLP evaluations, dysphagia diet  Acute HFrEF, suspected to be due to sepsis/covid-related cardiomyopathy, HTN:   - Note pt previously on lasix 40mg  po BID though not recently filled.  - Will need cardiology evaluation/follow up. - Continue ASA, statin, beta blocker (increase dose w/tachycardia and rising BPs), aim to restart ACE/ARB soon. 02/04/2019: Edema of  the extremities improved with IV Lasix 80 mg x 1 dose given yesterday.  We will continue  torsemide 20 mg p.o. twice daily (patient was on Lasix prior to admission).  Continue to monitor renal function and electrolytes.  Shock, cardiogenic and septic:  Weaned from pressors, now restarting antihypertensives, cultures negative. Central line removed 1/14. Volume status is stable.  - Resolved.  Poorly-controlled IDT2DM with insulin pump as outpatient:  -Continue levemir, change CBGs and novolog to AC/HS, add mealtime insulin and monitor. Showing overall reassuring trend since off steroids. 02/04/2019:Continue subcutaneous Levemir, premeal insulin as well as sliding scale insulin.  Continue to monitor blood sugar closely.  Muscular deconditioning:  -Severe.  - PT/OT, CIR would be ideal setting for rehabilitation.  02/03/2019: Patient has critical illness myopathy.  Patient is awaiting rehab.  Morbid obesity:  BMI 53. Noted.   Hypernatremia:  02/03/2019: Mild and stable.  Repeat BMP in the morning. 02/04/2019: Resolved.  BMP done today revealed sodium of 140.  Hypokalemia:  - Supplement as indicated and monitor 02/03/2019: Resolved.  Continue to monitor.  Last potassium was 3.8.  Repeat renal panel in the morning.  Anemia of critical illness:  - No bleeding noted, continue monitoring  DVT prophylaxis: Lovenox 0.5mg /kg q24h Code Status: Full Family Communication: Daughter by phone this afternoon. Disposition Plan: Transfer to non-covid floor. Needs ongoing PT/OT. CIR consulted for placement.  Consultants:   PCCM  ID: discussed case with Dr. Baxter Flattery by phone 02/02/2019 for recommendations regarding isolation.  PM&R  Procedures:  ETT 1/4 >> 1/20 L IJ CVL 1/5 >> 1/14 Arterial R radial 1/6 >> 1/13  Antimicrobials/Covid therapies: Remdesivir 1/1 >> 1/6  Tocilizumab 1/2 x1  Decadron 1/1 >> to stress dose steroids 1/7 >> stopped 1/11  Vanco 1/4 >> 1/10  Cefepime 1/4 >> 1/7 Meropenem 1/7 >> 1/10  Cipro 1/7 >> 1/10  Subjective: No new complaints. Patient actually looks  better today.  Patient feels less short of breath today.  Objective: Vitals:   02/03/19 2324 02/03/19 2335 02/04/19 0605 02/04/19 0804  BP: 135/76  116/86 (!) 149/90  Pulse: 96  (!) 108 (!) 112  Resp: (!) 25  (!) 21 (!) 22  Temp: 99.1 F (37.3 C)  98.8 F (37.1 C) 98.2 F (36.8 C)  TempSrc: Oral  Oral   SpO2: 96% 94% 100% 97%  Weight:   (!) 166.1 kg   Height:        Intake/Output Summary (Last 24 hours) at 02/04/2019 0932 Last data filed at 02/03/2019 2006 Gross per 24 hour  Intake 240 ml  Output 1900 ml  Net -1660 ml   Filed Weights   02/02/19 0500 02/03/19 0500 02/04/19 0605  Weight: (!) 164.2 kg (!) 164.3 kg (!) 166.1 kg    Gen: Morbidly obese. Myopathy (guess from illness).  Pulm: Decreased air entry  CV: S1S2 GI: Abdomen soft, non-tender, non-distended, with normoactive bowel sounds. No organomegaly or masses felt. Ext: Edema has resolved.   Skin: Nasal abrasion without erythema or exudate. No other rashes, lesions or ulcers on visualized skin Neuro: Awake and alert.  Follow-up proximal extremities his 1/5.  Power lower extremities is 1+ to 2/5.   Data Reviewed: I have personally reviewed following labs and imaging studies  CBC: Recent Labs  Lab 01/29/19 0406 01/31/19 0125 02/01/19 0541 02/04/19 0531  WBC 15.0* 13.3* 11.3* 8.9  NEUTROABS  --  9.0*  --  4.3  HGB 11.8* 9.8* 11.0* 11.5*  HCT 38.2 30.9* 34.8* 36.1  MCV 85.3 84.0 83.1 84.7  PLT 136* 122* 204 [id number]   Basic Metabolic Panel: Recent Labs  Lab 01/29/19 0406 01/31/19 0125 02/01/19 0541 02/02/19 0258 02/04/19 0531  NA 150* 146* 148* 148* 140  K 3.5 3.2* 3.3* 3.8 3.9  CL 109 107 105 109 103  CO2 34* 32 31 29 27   GLUCOSE 192* 179* 81 70 215*  BUN 33* 24* 15 14 14   CREATININE 0.91 0.56 0.47 0.59 0.66  CALCIUM 8.5* 8.5* 8.7* 8.9 8.9  MG 2.7*  --   --  2.4 2.2  PHOS 2.4*  --   --   --  4.2   GFR: Estimated Creatinine Clearance: 139.5 mL/min (by C-G formula based on SCr of 0.66 mg/dL). Liver  Function Tests: Recent Labs  Lab 01/29/19 0406 02/04/19 0531  AST 53*  --   ALT 83*  --   ALKPHOS 60  --   BILITOT 0.7  --   PROT 5.3*  --   ALBUMIN 2.6* 2.8*   No results for input(s): LIPASE, AMYLASE in the last 168 hours. No results for input(s): AMMONIA in the last 168 hours. Coagulation Profile: Recent Labs  Lab 01/31/19 0125  INR 1.2   Cardiac Enzymes: No results for input(s): CKTOTAL, CKMB, CKMBINDEX, TROPONINI in the last 168 hours. BNP (last 3 results) No results for input(s): PROBNP in the last 8760 hours. HbA1C: No results for input(s): HGBA1C in the last 72 hours. CBG: Recent Labs  Lab 02/03/19 0432 02/03/19 0751 02/03/19 1134 02/03/19 1623 02/04/19 0751  GLUCAP 167* 197* 186* 245* 218*   Lipid Profile: No results for input(s): CHOL, HDL, LDLCALC, TRIG, CHOLHDL, LDLDIRECT in the last 72 hours. Thyroid Function Tests: No results for input(s): TSH, T4TOTAL, FREET4, T3FREE, THYROIDAB in the last 72 hours. Anemia Panel: No results for input(s): VITAMINB12, FOLATE, FERRITIN, TIBC, IRON, RETICCTPCT in the last 72 hours. Urine analysis:    Component Value Date/Time   COLORURINE YELLOW 01/31/2013 1138   APPEARANCEUR CLEAR 01/31/2013 1138   LABSPEC 1.029 01/31/2013 1138   PHURINE 6.0 01/31/2013 1138   GLUCOSEU NEG 01/31/2013 1138   HGBUR NEG 01/31/2013 1138   BILIRUBINUR NEG 01/31/2013 1138   KETONESUR NEG 01/31/2013 1138   PROTEINUR NEG 01/31/2013 1138   UROBILINOGEN 0.2 01/31/2013 1138   NITRITE NEG 01/31/2013 1138   LEUKOCYTESUR NEG 01/31/2013 1138   No results found for this or any previous visit (from the past 240 hour(s)).    Radiology Studies: DG CHEST PORT 1 VIEW  Result Date: 02/03/2019 CLINICAL DATA:  Shortness of breath, released 1 day ago from [facility] due to COVID-19 EXAM: PORTABLE CHEST 1 VIEW COMPARISON:  Portable exam 1158 hours compared to 01/27/2019 FINDINGS: Minimal enlargement of cardiac silhouette post median sternotomy.  Stable mediastinal contours for degree of rotation to the LEFT. Infiltrates throughout LEFT lung and at RIGHT base consistent with multifocal pneumonia. More dense opacification of the LEFT lower lobe question more solid consolidation versus superimposed atelectasis. No pleural effusion or pneumothorax. IMPRESSION: Persistent BILATERAL pulmonary infiltrates LEFT greater than RIGHT consistent with multifocal pneumonia. Cannot exclude coexistent atelectasis in LEFT lower. Electronically Signed   By: Lavonia Dana M.D.   On: 02/03/2019 12:47    Scheduled Meds: . aspirin  81 mg Oral Daily  . carvedilol  6.25 mg Per Tube BID WC  . chlorhexidine  15 mL Mouth/Throat BID  . enoxaparin (LOVENOX) injection  80 mg Subcutaneous Q24H  . insulin aspart  0-20 Units Subcutaneous TID WC  .  insulin aspart  10 Units Subcutaneous TID WC  . insulin detemir  30 Units Subcutaneous Daily  . neomycin-bacitracin-polymyxin   Topical Daily  . rosuvastatin  40 mg Oral Daily   Continuous Infusions:   LOS: 23 days   Time spent: 35 minutes.  Bonnell Public, MD [facility] [email or web address] 02/04/2019, 9:32 AM

## 2019-02-05 LAB — GLUCOSE, CAPILLARY
Glucose-Capillary: 273 mg/dL — ABNORMAL HIGH (ref 70–99)
Glucose-Capillary: 292 mg/dL — ABNORMAL HIGH (ref 70–99)
Glucose-Capillary: 212 mg/dL — ABNORMAL HIGH (ref 70–99)
Glucose-Capillary: 246 mg/dL — ABNORMAL HIGH (ref 70–99)

## 2019-02-05 LAB — CBC WITH DIFFERENTIAL/PLATELET
Abs Immature Granulocytes: 0.03 10*3/uL (ref 0.00–0.07)
Basophils Absolute: 0 10*3/uL (ref 0.0–0.1)
Basophils Relative: 0 %
Eosinophils Absolute: 0 10*3/uL (ref 0.0–0.5)
Eosinophils Relative: 0 %
HCT: 36.2 % (ref 36.0–46.0)
Hemoglobin: 11.8 g/dL — ABNORMAL LOW (ref 12.0–15.0)
Immature Granulocytes: 0 %
Lymphocytes Relative: 37 %
Lymphs Abs: 3.5 10*3/uL (ref 0.7–4.0)
MCH: 27.4 pg (ref 26.0–34.0)
MCHC: 32.6 g/dL (ref 30.0–36.0)
MCV: 84 fL (ref 80.0–100.0)
Monocytes Absolute: 0.8 10*3/uL (ref 0.1–1.0)
Monocytes Relative: 8 %
Neutro Abs: 5.1 10*3/uL (ref 1.7–7.7)
Neutrophils Relative %: 55 %
Platelets: 358 10*3/uL (ref 150–400)
RBC: 4.31 MIL/uL (ref 3.87–5.11)
RDW: 21.6 % — ABNORMAL HIGH (ref 11.5–15.5)
WBC: 9.4 10*3/uL (ref 4.0–10.5)
nRBC: 0 % (ref 0.0–0.2)

## 2019-02-05 LAB — RENAL FUNCTION PANEL
Albumin: 2.8 g/dL — ABNORMAL LOW (ref 3.5–5.0)
Anion gap: 11 (ref 5–15)
BUN: 15 mg/dL (ref 6–20)
CO2: 28 mmol/L (ref 22–32)
Calcium: 9 mg/dL (ref 8.9–10.3)
Chloride: 101 mmol/L (ref 98–111)
Creatinine, Ser: 0.71 mg/dL (ref 0.44–1.00)
GFR calc Af Amer: >60 mL/min (ref >60)
GFR calc non Af Amer: >60 mL/min (ref >60)
Glucose, Bld: 262 mg/dL — ABNORMAL HIGH (ref 70–99)
Phosphorus: 4 mg/dL (ref 2.5–4.6)
Potassium: 3.8 mmol/L (ref 3.5–5.1)
Sodium: 140 mmol/L (ref 135–145)

## 2019-02-05 LAB — MAGNESIUM: Magnesium: 2 mg/dL (ref 1.7–2.4)

## 2019-02-05 MED ORDER — INSULIN GLARGINE 100 UNIT/ML ~~LOC~~ SOLN
20.0000 [IU] | Freq: Two times a day (BID) | SUBCUTANEOUS | Status: DC
  Administered 2019-02-05 – 2019-02-07 (×5): 20 [IU] via SUBCUTANEOUS
  Filled 2019-02-05 (×6): qty 0.2

## 2019-02-05 MED ORDER — LEVALBUTEROL HCL 0.63 MG/3ML IN NEBU: 0.6300 mg | INHALATION_SOLUTION | Freq: Four times a day (QID) | RESPIRATORY_TRACT | Status: DC | PRN

## 2019-02-05 MED ORDER — LORATADINE 10 MG PO TABS
10.0000 mg | ORAL_TABLET | Freq: Every day | ORAL | Status: DC | PRN
  Administered 2019-02-05 – 2019-02-08 (×4): 10 mg via ORAL
  Filled 2019-02-05 (×4): qty 1

## 2019-02-05 NOTE — Progress Notes (Signed)
Physical Therapy Treatment Patient Details Name: Kaitlin [name] MRN: [id number] DOB: 03-27-1965 Today's Date: 02/05/2019    History of Present Illness 54 y.o. female with medical history significant of anemia, bronchitis, bilateral cataracts, chronic diastolic CHF, type 2 diabetes, hyperlipidemia, hypertension, leukocytosis, morbid obesity, peripheral neuropathy, sickle cell trait, history of thymoma with history of thymoma resection . Intubated 1/4, support was weaned to pressure support 1/16, and subsequently the patient was extubated 1/20 and has since weaned off of supplemental oxygen. Patient transfered to [facility] on 02/03/19.    PT Comments    Patient seen for first therapy session since transfer from [facility]. She tolerated it well only limited by fatigue and pain in bil shoulders.  Pt remained on 2 L O2 [place] throughout session, no SOB, or signs of distress. Patient was able to sit EOB with 2 person assist and sat EOB for ~ 20 minutes. She was able to actively weight shift anteriorly to engage abdominal and performed repeated lateral shift to forearms to engage obliques by returning to midline. Pt was able to transfer to bariatric chair for the first time with use of Maxi-Move lift and 3+ for safety. She would benefit from intensive multidisciplinary therapy at CIR setting before she d/cs home with her family. PT will continue to follow acutely for safe mobility progression.     Follow Up Recommendations  CIR     Equipment Recommendations  Rolling walker with 5" wheels;3in1 (PT);Wheelchair (measurements PT);Wheelchair cushion (measurements PT);Hospital bed;Other (comment)(bariatric equipment. )    Recommendations for Other Services Rehab consult     Precautions / Restrictions Precautions Precautions: Fall Precaution Comments: morbid obesity Restrictions Weight Bearing Restrictions: No    Mobility  Bed Mobility Overal bed mobility: Needs Assistance Bed Mobility: Rolling;Supine to Sit;Sit  to Supine Rolling: +2 for physical assistance;Total assist   Supine to sit: +2 for physical assistance;Total assist Sit to supine: +2 for physical assistance;Total assist   General bed mobility comments: pt attempting to reach Rt UE for rolling to Lt side and attempting to lift LE's to walk to EOB. Assist requried due to weakness. Patient requiring Max Assist +2 to pivot to sit up EOB with assist to bring LE's off EOB and block knees/feet to prevent anterior slide. Max assist for posterior support at trunk as pt had significant posterior lean. Assisted to supine with Max +3 to roll for sling placement.  Transfers Overall transfer level: Needs assistance        General transfer comment: Pt requires Total Assist for Bed to chair transfer with MaxiMove. Pt able to hold UE's together on stomach with assistance to flexion shoulder and bring hand together.  Ambulation/Gait          Stairs      Wheelchair Mobility    Modified Rankin (Stroke Patients Only)       Balance Overall balance assessment: Needs assistance Sitting-balance support: Feet supported;Bilateral upper extremity supported Sitting balance-Leahy Scale: Zero Sitting balance - Comments: Max to total assist seated EOB due to weakness. Pt performing ~6x anterior weigth shift to engage abdominals and able to support herself for 5-15 seconds at a time. Pt performed weight shifting latearlly into Rt forearm and pressing up x4 and weigth shift to Lt forearm with pressing up to midline x2 (limited by shoulder pain and fatigue). Postural control: Posterior lean;Left lateral lean           Cognition Arousal/Alertness: Awake/alert Behavior During Therapy: WFL for tasks assessed/performed   Area of Impairment: Safety/judgement  Safety/Judgement: Decreased awareness of deficits     General Comments: pt with overall good processing to follow simple and complex commands. Pt with decreaed awareness of deficits  initially thinking she would be able to stand on her own today. Pt tearful during session and both sad and thankful, stating "thank you" and highly motivated to participate in therapeutic activities to progress her mobility.      Exercises General Exercises - Lower Extremity Short Arc Quad: AROM;Both;5 reps;Supine Heel Slides: AROM;Both;5 reps;Supine    General Comments        Pertinent Vitals/Pain Pain Assessment: No/denies pain           PT Goals (current goals can now be found in the care plan section) Acute Rehab PT Goals Patient Stated Goal: to get home to her grandbabies PT Goal Formulation: With family Time For Goal Achievement: 02/13/19 Potential to Achieve Goals: Good Progress towards PT goals: Progressing toward goals    Frequency    Min 3X/week      PT Plan Current plan remains appropriate    Co-evaluation PT/OT/SLP Co-Evaluation/Treatment: Yes Reason for Co-Treatment: Complexity of the patient's impairments (multi-system involvement);To address functional/ADL transfers;For patient/therapist safety PT goals addressed during session: Mobility/safety with mobility;Balance;Strengthening/ROM        AM-PAC PT "6 Clicks" Mobility   Outcome Measure  Help needed turning from your back to your side while in a flat bed without using bedrails?: Total Help needed moving from lying on your back to sitting on the side of a flat bed without using bedrails?: Total Help needed moving to and from a bed to a chair (including a wheelchair)?: Total Help needed standing up from a chair using your arms (e.g., wheelchair or bedside chair)?: Total Help needed to walk in hospital room?: Total Help needed climbing 3-5 steps with a railing? : Total 6 Click Score: 6    End of Session Equipment Utilized During Treatment: Oxygen(2L O2 [place]) Activity Tolerance: Patient limited by fatigue Patient left: with call bell/phone within reach;in chair;with chair alarm set Nurse  Communication: Mobility status;Need for lift equipment PT Visit Diagnosis: Muscle weakness (generalized) (M62.81);Difficulty in walking, not elsewhere classified (R26.2)     Time: [id number] PT Time Calculation (min) (ACUTE ONLY): 54 min  Charges:  $Therapeutic Exercise: 8-22 mins $Therapeutic Activity: 8-22 mins                    Verner Mould, DPT Physical Therapist with [facility] [phone]  02/05/2019 5:54 PM

## 2019-02-05 NOTE — Progress Notes (Signed)
   Vital Signs MEWS/VS Documentation      02/05/2019 0513 02/05/2019 0700 02/05/2019 0908 02/05/2019 0955   MEWS Score:  1  2  2   --   MEWS Score Color:  Green  Yellow  Yellow  --   Resp:  16  --  20  --   Pulse:  (!) 103  --  (!) 116  --   BP:  114/75  --  127/79  --   Temp:  98 F (36.7 C)  --  97.8 F (36.6 C)  --   O2 Device:  Nasal Cannula  --  Nasal Cannula  --   O2 Flow Rate (L/min):  --  --  2 L/min  --   Level of Consciousness:  --  --  Alert  --       Patient in yellow mews due to heart rate.  Morning Coreg given.   Dr. Willaim Bane notified.  Yellow mews initiated again.    Sharla Tankard, Kathryne Hitch 02/05/2019,9:57 AM

## 2019-02-05 NOTE — Progress Notes (Signed)
Occupational Therapy Treatment Patient Details Name: Kaitlin [name] MRN: [id number] DOB: September 17, 1965 Today's Date: 02/05/2019    History of present illness 54 y.o. female with medical history significant of anemia, bronchitis, bilateral cataracts, chronic diastolic CHF, type 2 diabetes, hyperlipidemia, hypertension, leukocytosis, morbid obesity, peripheral neuropathy, sickle cell trait, history of thymoma with history of thymoma resection . Intubated 1/4, support was weaned to pressure support 1/16, and subsequently the patient was extubated 1/20 and has since weaned off of supplemental oxygen. Patient transfered to [facility] on 02/03/19.   OT comments    Follow Up Recommendations  CIR;Supervision/Assistance - 24 hour    Equipment Recommendations  3 in 1 bedside commode    Recommendations for Other Services Rehab consult    Precautions / Restrictions Precautions Precautions: Fall Precaution Comments: morbid obesity Restrictions Weight Bearing Restrictions: No       Mobility Bed Mobility Overal bed mobility: Needs Assistance Bed Mobility: Rolling;Supine to Sit;Sit to Supine Rolling: +2 for physical assistance;Total assist   Supine to sit: +2 for physical assistance;Total assist Sit to supine: +2 for physical assistance;Total assist   General bed mobility comments: pt attempting to reach Rt UE for rolling to Lt side and attempting to lift LE's to walk to EOB. Assist requried due to weakness. Patient requiring Max Assist +2 to pivot to sit up EOB with assist to bring LE's off EOB and block knees/feet to prevent anterior slide. Max assist for posterior support at trunk as pt had significant posterior lean. Assisted to supine with Max +3 to roll for sling placement.  Transfers Overall transfer level: Needs assistance               General transfer comment: Pt requires Total Assist for Bed to chair transfer with MaxiMove. Pt able to hold UE's together on stomach with assistance to  flexion shoulder and bring hand together.    Balance Overall balance assessment: Needs assistance Sitting-balance support: Feet supported;Bilateral upper extremity supported Sitting balance-Leahy Scale: Zero Sitting balance - Comments: Max to total assist seated EOB due to weakness. Pt performing ~6x anterior weigth shift to engage abdominals and able to support herself for 5-15 seconds at a time. Pt performed weight shifting latearlly into Rt forearm and pressing up x4 and weigth shift to Lt forearm with pressing up to midline x2 (limited by shoulder pain and fatigue). Postural control: Posterior lean;Left lateral lean                                 ADL either performed or assessed with clinical judgement   ADL Overall ADL's : Needs assistance/impaired Eating/Feeding: Cueing for safety;Maximal assistance;With adaptive utensils Eating/Feeding Details (indicate cue type and reason): using an adaptive cup to drink.  OT needed to prop up RUE on pillows                                   General ADL Comments: pt so motivated.  Pt is very positive.     Vision Baseline Vision/History: Wears glasses            Cognition Arousal/Alertness: Awake/alert Behavior During Therapy: WFL for tasks assessed/performed   Area of Impairment: Safety/judgement                         Safety/Judgement: Decreased awareness of deficits  General Comments: pt with overall good processing to follow simple and complex commands. Pt with decreaed awareness of deficits initially thinking she would be able to stand on her own today. Pt tearful during session and both sad and thankful, stating "thank you" and highly motivated to participate in therapeutic activities to progress her mobility.                   Pertinent Vitals/ Pain       Pain Assessment: No/denies pain         Frequency  Min 3X/week        Progress Toward Goals  OT Goals(current  goals can now be found in the care plan section)  Progress towards OT goals: Progressing toward goals  Acute Rehab OT Goals Patient Stated Goal: to get home to her grandbabies  Plan Discharge plan remains appropriate    Co-evaluation      Reason for Co-Treatment: Complexity of the patient's impairments (multi-system involvement);To address functional/ADL transfers;For patient/therapist safety PT goals addressed during session: Mobility/safety with mobility;Balance;Strengthening/ROM        AM-PAC OT "6 Clicks" Daily Activity     Outcome Measure   Help from another person eating meals?: A Lot Help from another person taking care of personal grooming?: A Lot Help from another person toileting, which includes using toliet, bedpan, or urinal?: Total Help from another person bathing (including washing, rinsing, drying)?: Total Help from another person to put on and taking off regular upper body clothing?: Total Help from another person to put on and taking off regular lower body clothing?: Total 6 Click Score: 8    End of Session Equipment Utilized During Treatment: Oxygen(1L)  OT Visit Diagnosis: Other abnormalities of gait and mobility (R26.89);Muscle weakness (generalized) (M62.81)   Activity Tolerance Patient tolerated treatment well   Patient Left with call bell/phone within reach;in chair   Nurse Communication Mobility status        Time: [id number] OT Time Calculation (min): 54 min  Charges: OT General Charges $OT Visit: 1 Visit OT Treatments $Self Care/Home Management : 23-37 mins  Kari Baars, [place] [phone] Office- [place], Edwena Felty D 02/05/2019, 6:20 PM

## 2019-02-05 NOTE — Progress Notes (Signed)
Inpatient Rehabilitation-Admissions Coordinator   Met with pt bedside as follow up from PM&R consult. Pt was sitting up in the recliner watching TV with hoyer pad underneath. Pt was alert, oriented, and able to have extensive discussion regarding rehab options. Pt reported no pain or discomfort and did not appear dyspneic during conversation. Pt is extremely debilitated with her left side weaker than her right. She was able to fix her nasal cannula behind her ear on the right side with only slight support at her elbow. Pt is highly motivated and wants to return to independence quickly. We discussed recommended CIR program, anticipated length of stay, and assistance recommended at DC. Pt unsure of what type of support her family can provide and did give [facility] consent to call them.   Spoke with pt's daughter Ludwig Lean") on the phone who plans to speak with her siblings tonight to see if they can provide the extensive assistance anticipated at DC.   Pt will have a level entry apartment that she can return to and at least 24/7 Supervision. Will follow up with pt and her family tomorrow to determine if they want to pursue IP Rehab and if they have the caregiver support necessary to for safety after the program. If caregiver support is lacking, pt will need SNF placement. Pt is aware.   Raechel Ache, OTR/L  Rehab Admissions Coordinator  [phone] 02/05/2019 4:39 PM

## 2019-02-05 NOTE — Progress Notes (Signed)
PROGRESS NOTE  Kaitlin [name]  [id number] DOB: [birth year] DOA: 01/12/2019 PCP: [place]  Brief Narrative: Patient is a 54 year old African-American female, morbidly obese, with past medical history significant for chronic HFpEF, T2DM, HTN, sickle cell trait and thymoma, recently resected.  Patient presented on 01/12/2019 to the ED with worsening dyspnea, cough, headache, and sore throat, found to be hypoxic with bilateral CXR infiltrates and positive SARS-CoV-2 antigen testing. Patient was admitted to [facility], treated with remdesivir and steroids, subsequently, tocilizumab 1/2 and convalescent plasma 1/4 when hypoxemia worsened from 8L > 15L requirement, hypercarbia and unresponsiveness developed, and the patient was intubated. Echocardiogram showed depressed LVEF to 45%-50% and diuresis was initiated. Antibiotics were added, then broadened due to shock requiring pressors and stress steroids. Central line was ultimately removed as well, though cultures remained negative and antibiotics were discontinued. Vent support was weaned to pressure support 1/16 and subsequently the patient was extubated 1/20 and has since weaned off of supplemental oxygen. PT/OT have recommended CIR rehabilitation, PM&R consulted. SLP has continued working with the patient and making dietary recommendations. Having completed 21 days of therapy, sustaining improvement, since positive covid testing, the patient's isolation may be discontinued. The patient is therefore to be transferred to [facility].  02/03/2019: Patient seen.  Patient becomes dyspneic while speaking.  Nursing staff is worried that this may be anxiety.  We will get a chest x-ray and will check cardiac BNP.  We will also give patient 1 dose of IV Lasix.  Further management will depend on above.  02/04/2019: Patient seen.  Tachycardia reported.  ABG and EKG done, results reviewed.  EKG showed sinus tachycardia.  The revealed pH of 7.47, PCO2 of 36, PO2  of 80.  Edema of the extremities improved significantly.  On further questioning, patient informed me that she was on diuretics daily prior to admission.  Will start patient on oral torsemide daily.  Continue to monitor renal function.  Critical illness myopathy persists.  02/05/2019: Patient seen.  Patient is tolerating oral diuretics.  Renal function and electrolytes within normal range.  We will continue to monitor closely.  We will also continue to monitor volume status closely.  Blood sugar is not optimally controlled.  Blood sugar today was 262.  Will discontinue subcutaneous Levemir 30 units once daily.  Will start patient on subcutaneous Lantus 20 units twice daily.  We will continue to monitor blood sugar closely.  Continue other diabetes management.  Eventual plan is for rehab when a bed is available.  Assessment & Plan: Principal Problem:   Acute respiratory distress syndrome (ARDS) due to COVID-19 virus [facility]) Active Problems:   Hypertension   HLD (hyperlipidemia)   Insulin dependent type 2 diabetes mellitus ([place])   Encounter for central line placement   Grade I diastolic dysfunction   Hypotension   Acute respiratory failure with hypoxemia (HCC)   Shock circulatory (HCC)   Septic shock (HCC)   Hypernatremia  Acute hypoxemic respiratory failure due to ARDS due to covid-19 pneumonia:  -SARS-CoV-2 Ag positive on 1/1.  - Wean supplemental oxygen as able. Was weaned to pressure support 1/16, extubated 1/20, on room air this morning. Afebrile. Goal SpO2 85-95% - continue incentive spirometry - Positive test was 21 days ago, pt has improved, last steroids > 1 week ago. Ok to DC isolation.  - Completed remdesivir x5 days, steroids x10 days, received tocilizumab 1/2, convalescent plasma 1/4. - Enoxaparin to return to standard weight-based prophylactic dose.  02/03/2019: Continue to monitor  respiratory symptoms closely.  Pursue chest x-ray, check cardiac BNP.  Low threshold to repeat ABG.   Will give 1 dose of IV Lasix. 02/05/2019: Kindly see above.  Dysphagia:   - Continue SLP evaluations, dysphagia diet  Acute HFrEF, suspected to be due to sepsis/covid-related cardiomyopathy, HTN:   - Note pt previously on lasix 40mg  po BID though not recently filled.  - Will need cardiology evaluation/follow up. - Continue ASA, statin, beta blocker (increase dose w/tachycardia and rising BPs), aim to restart ACE/ARB soon. 02/04/2019: Edema of the extremities improved with IV Lasix 80 mg x 1 dose given yesterday.  We will continue torsemide 20 mg p.o. twice daily (patient was on Lasix prior to admission).  Continue to monitor renal function and electrolytes. 02/05/2019: Continue torsemide.  Continue to monitor renal function and electrolytes.  Continue to monitor volume status.  Shock, cardiogenic and septic:  Weaned from pressors, now restarting antihypertensives, cultures negative. Central line removed 1/14. Volume status is stable.  - Resolved.  Poorly-controlled IDT2DM with insulin pump as outpatient:  -Continue levemir, change CBGs and novolog to AC/HS, add mealtime insulin and monitor. Showing overall reassuring trend since off steroids. 02/04/2019:Continue subcutaneous Levemir, premeal insulin as well as sliding scale insulin.  Continue to monitor blood sugar closely. 02/05/2019: Discontinue Levemir.  Start subcutaneous Lantus 20 units twice daily.  Continue other diabetes management.  Continue to monitor blood sugar closely.  Muscular deconditioning:  -Severe.  - PT/OT, CIR would be ideal setting for rehabilitation.  02/03/2019: Patient has critical illness myopathy.  Patient is awaiting rehab.  Morbid obesity:  BMI 53. Noted.   Hypernatremia:  02/03/2019: Mild and stable.  Repeat BMP in the morning. 02/04/2019: Resolved.  BMP done today revealed sodium of 140.  Hypokalemia:  - Supplement as indicated and monitor 02/03/2019: Resolved.  Continue to monitor.  Last potassium was 3.8.   Repeat renal panel in the morning.  Anemia of critical illness:  - No bleeding noted, continue monitoring  DVT prophylaxis: Lovenox 0.5mg /kg q24h Code Status: Full Family Communication: Daughter by phone this afternoon. Disposition Plan: Transfer to non-covid floor. Needs ongoing PT/OT. CIR consulted for placement.  Consultants:   PCCM  ID: discussed case with Dr. Baxter Flattery by phone 02/02/2019 for recommendations regarding isolation.  PM&R  Procedures:  ETT 1/4 >> 1/20 L IJ CVL 1/5 >> 1/14 Arterial R radial 1/6 >> 1/13  Antimicrobials/Covid therapies: Remdesivir 1/1 >> 1/6  Tocilizumab 1/2 x1  Decadron 1/1 >> to stress dose steroids 1/7 >> stopped 1/11  Vanco 1/4 >> 1/10  Cefepime 1/4 >> 1/7 Meropenem 1/7 >> 1/10  Cipro 1/7 >> 1/10  Subjective: Patient seems worried about her blood sugar level.   Otherwise, no new complaints.  Objective: Vitals:   02/04/19 1637 02/04/19 2107 02/05/19 0513 02/05/19 0908  BP: 126/79 126/86 114/75 127/79  Pulse: (!) 105 98 (!) 103 (!) 116  Resp: 20 19 16 20   Temp: 99.3 F (37.4 C) 98.7 F (37.1 C) 98 F (36.7 C) 97.8 F (36.6 C)  TempSrc: Oral Oral Oral   SpO2: 97% 97% 95% 97%  Weight:   (!) 165.6 kg   Height:        Intake/Output Summary (Last 24 hours) at 02/05/2019 1248 Last data filed at 02/05/2019 0514 Gross per 24 hour  Intake 240 ml  Output 1200 ml  Net -960 ml   Filed Weights   02/03/19 0500 02/04/19 0605 02/05/19 0513  Weight: (!) 164.3 kg (!) 166.1 kg (!) 165.6  kg    Gen: Morbidly obese. Myopathy (guess from illness).  Pulm: Decreased air entry  CV: S1S2 GI: Abdomen soft, non-tender, non-distended, with normoactive bowel sounds. No organomegaly or masses felt. Ext: Edema has resolved.   Skin: Nasal abrasion without erythema or exudate. No other rashes, lesions or ulcers on visualized skin Neuro: Awake and alert.  Follow-up proximal extremities his 1/5.  Power lower extremities is 1+ to 2/5.   Data Reviewed: I  have personally reviewed following labs and imaging studies  CBC: Recent Labs  Lab 01/31/19 0125 02/01/19 0541 02/04/19 0531 02/05/19 0517  WBC 13.3* 11.3* 8.9 9.4  NEUTROABS 9.0*  --  4.3 5.1  HGB 9.8* 11.0* 11.5* 11.8*  HCT 30.9* 34.8* 36.1 36.2  MCV 84.0 83.1 84.7 84.0  PLT 122* 204 295 [id number]   Basic Metabolic Panel: Recent Labs  Lab 01/31/19 0125 02/01/19 0541 02/02/19 0258 02/04/19 0531 02/05/19 0517  NA 146* 148* 148* 140 140  K 3.2* 3.3* 3.8 3.9 3.8  CL 107 105 109 103 101  CO2 32 31 29 27 28   GLUCOSE 179* 81 70 215* 262*  BUN 24* 15 14 14 15   CREATININE 0.56 0.47 0.59 0.66 0.71  CALCIUM 8.5* 8.7* 8.9 8.9 9.0  MG  --   --  2.4 2.2 2.0  PHOS  --   --   --  4.2 4.0   GFR: Estimated Creatinine Clearance: 139.2 mL/min (by C-G formula based on SCr of 0.71 mg/dL). Liver Function Tests: Recent Labs  Lab 02/04/19 0531 02/05/19 0517  ALBUMIN 2.8* 2.8*   No results for input(s): LIPASE, AMYLASE in the last 168 hours. No results for input(s): AMMONIA in the last 168 hours. Coagulation Profile: Recent Labs  Lab 01/31/19 0125  INR 1.2   Cardiac Enzymes: No results for input(s): CKTOTAL, CKMB, CKMBINDEX, TROPONINI in the last 168 hours. BNP (last 3 results) No results for input(s): PROBNP in the last 8760 hours. HbA1C: No results for input(s): HGBA1C in the last 72 hours. CBG: Recent Labs  Lab 02/04/19 1238 02/04/19 1700 02/04/19 2110 02/05/19 0802 02/05/19 1243  GLUCAP 268* 243* 215* 273* 292*   Lipid Profile: No results for input(s): CHOL, HDL, LDLCALC, TRIG, CHOLHDL, LDLDIRECT in the last 72 hours. Thyroid Function Tests: No results for input(s): TSH, T4TOTAL, FREET4, T3FREE, THYROIDAB in the last 72 hours. Anemia Panel: No results for input(s): VITAMINB12, FOLATE, FERRITIN, TIBC, IRON, RETICCTPCT in the last 72 hours. Urine analysis:    Component Value Date/Time   COLORURINE YELLOW 01/31/2013 1138   APPEARANCEUR CLEAR 01/31/2013 1138   LABSPEC  1.029 01/31/2013 1138   PHURINE 6.0 01/31/2013 1138   GLUCOSEU NEG 01/31/2013 1138   HGBUR NEG 01/31/2013 1138   BILIRUBINUR NEG 01/31/2013 1138   KETONESUR NEG 01/31/2013 1138   PROTEINUR NEG 01/31/2013 1138   UROBILINOGEN 0.2 01/31/2013 1138   NITRITE NEG 01/31/2013 1138   LEUKOCYTESUR NEG 01/31/2013 1138   No results found for this or any previous visit (from the past 240 hour(s)).    Radiology Studies: No results found.  Scheduled Meds: . aspirin  81 mg Oral Daily  . carvedilol  12.5 mg Oral BID WC  . chlorhexidine  15 mL Mouth/Throat BID  . enoxaparin (LOVENOX) injection  80 mg Subcutaneous Q24H  . insulin aspart  0-20 Units Subcutaneous TID WC  . insulin aspart  10 Units Subcutaneous TID WC  . insulin detemir  30 Units Subcutaneous Daily  . neomycin-bacitracin-polymyxin   Topical Daily  .  rosuvastatin  40 mg Oral Daily  . torsemide  20 mg Oral BID   Continuous Infusions:   LOS: 24 days   Time spent: 25 minutes.  Bonnell Public, MD [facility] [email or web address] 02/05/2019, 12:48 PM

## 2019-02-05 NOTE — Progress Notes (Signed)
Inpatient Diabetes Program Recommendations  AACE/ADA: New Consensus Statement on Inpatient Glycemic Control (2015)  Target Ranges:  Prepandial:   less than 140 mg/dL      Peak postprandial:   less than 180 mg/dL (1-2 hours)      Critically ill patients:  140 - 180 mg/dL   Lab Results  Component Value Date   GLUCAP 273 (H) 02/05/2019   HGBA1C 7.1 (H) 01/13/2019    Review of Glycemic Control Results for ANNACLARA, SEARCH (MRN [id number]) as of 02/05/2019 10:52  Ref. Range 02/04/2019 07:51 02/04/2019 12:38 02/04/2019 17:00 02/04/2019 21:10 02/05/2019 08:02  Glucose-Capillary Latest Ref Range: 70 - 99 mg/dL 218 (H) 268 (H) 243 (H) 215 (H) 273 (H)   Diabetes history: DM 2 Outpatient Diabetes medications: insulin pump, Metformin 1000 gm bid, Semaglutide 0.25 mg weekly Current orders for Inpatient glycemic control:  Levemir 30 units Daily Novolog 0-20 units tid  Novolog 10 units tid meal coverage  Inpatient Diabetes Program Recommendations:    Glucose trends consistently >200. Pt may benefit from increasing Levemir.  Thanks,  Tama Headings RN, MSN, BC-ADM Inpatient Diabetes Coordinator Team Pager [phone] (8a-5p)

## 2019-02-05 NOTE — Progress Notes (Signed)
  Speech Language Pathology Treatment: Dysphagia  Patient Details Name: Kaitlin [name] MRN: [id number] DOB: 10-23-1965 Today's Date: 02/05/2019 Time: [id number] SLP Time Calculation (min) (ACUTE ONLY): 17 min  Assessment / Plan / Recommendation Clinical Impression  No indication of airway compromise and pt passed 3 ounce water test surprisingly; She reports concern re: nectar thick liquids causing her blood sugar to increase and has been drinking thin water.   She admits to occasional coughing with liquids more than foods, reports she only drinks water.  SLP advised her to recommendation for water given Ph neutral status.  Gross deconditioning does require her to be fed by staff but she is cognizant and able to direct her care/rate of po.  Functional oral mastication abilities noted. Will advance diet to carb mod/thin and monitor to assure tolerance.  Voice subjectively appears stronger than during prior SLP session.  Pt educated to findings/recommendations using teach back and mod cues.    HPI HPI: 54 y.o. female with medical history significant of anemia, bronchitis, bilateral cataracts, chronic diastolic CHF, type 2 diabetes, hyperlipidemia, hypertension, leukocytosis, morbid obesity, peripheral neuropathy, sickle cell trait, history of thymoma resection . Intubated 1/4-1/20.  She has [facility] from [facility] to [facility] for continued medical treatment.  Swallow evaluation completed with pt starting dys2/honey - then dys2/nectar diet. Today follow up indicated to assess po tolerance and readiness for dietary advancement.  Pt subjectively gives voice strength a number 4/10 - 10 being normal.  She denies dysphagia.      SLP Plan  Continue with current plan of care       Recommendations  Diet recommendations: Regular;Thin liquid Liquids provided via: Cup;Straw Medication Administration: Whole meds with puree Supervision: Full supervision/cueing for compensatory strategies;Staff to assist with  self feeding Compensations: Slow rate;Small sips/bites;Lingual sweep for clearance of pocketing Postural Changes and/or Swallow Maneuvers: Seated upright 90 degrees;Upright 30-60 min after meal                Oral Care Recommendations: Oral care BID Follow up Recommendations: Inpatient Rehab SLP Visit Diagnosis: Dysphagia, unspecified (R13.10) Plan: Continue with current plan of care       GO                Kaitlin [name] 02/05/2019, 9:10 AM  Kaitlin Lime, MS [place] Office [phone]

## 2019-02-06 LAB — GLUCOSE, CAPILLARY
Glucose-Capillary: 291 mg/dL — ABNORMAL HIGH (ref 70–99)
Glucose-Capillary: 305 mg/dL — ABNORMAL HIGH (ref 70–99)
Glucose-Capillary: 270 mg/dL — ABNORMAL HIGH (ref 70–99)
Glucose-Capillary: 191 mg/dL — ABNORMAL HIGH (ref 70–99)
Glucose-Capillary: 131 mg/dL — ABNORMAL HIGH (ref 70–99)

## 2019-02-06 MED ORDER — INSULIN ASPART 100 UNIT/ML ~~LOC~~ SOLN
12.0000 [IU] | Freq: Three times a day (TID) | SUBCUTANEOUS | Status: DC
  Administered 2019-02-06 – 2019-02-07 (×3): 12 [IU] via SUBCUTANEOUS

## 2019-02-06 MED ORDER — ENSURE MAX PROTEIN PO LIQD
11.0000 [oz_av] | Freq: Every day | ORAL | Status: DC
  Administered 2019-02-06 – 2019-02-07 (×2): 11 [oz_av] via ORAL
  Filled 2019-02-06 (×3): qty 330

## 2019-02-06 NOTE — Progress Notes (Signed)
   Vital Signs MEWS/VS Documentation      02/06/2019 0842 02/06/2019 1421 02/06/2019 1454 02/06/2019 1454   MEWS Score:  1  2  2  2    MEWS Score Color:  Green  Yellow  Yellow  Yellow   Resp:  -  18  -  18   Pulse:  -  (!) 111  -  (!) 111   BP:  -  108/69  -  (!) 132/98   Temp:  -  98.9 F (37.2 C)  -  97.8 F (36.6 C)   O2 Device:  -  Nasal Cannula  -  Nasal Cannula   O2 Flow Rate (L/min):  -  2 L/min  -  2 L/min   Level of Consciousness:  Alert  -  Alert  -      Patient alerted yellow mews due to elevated HR.  MD notified.  No new orders at this time.  Will continue to monitor via yellow mews at this time.     Virginia Rochester 02/06/2019,3:24 PM

## 2019-02-06 NOTE — Progress Notes (Signed)
Physical Therapy Treatment Patient Details Name: Kaitlin [name] MRN: [id number] DOB: October 08, 1965 Today's Date: 02/06/2019    History of Present Illness 54 y.o. female with medical history significant of anemia, bronchitis, bilateral cataracts, chronic diastolic CHF, type 2 diabetes, hyperlipidemia, hypertension, leukocytosis, morbid obesity, peripheral neuropathy, sickle cell trait, history of thymoma with history of thymoma resection . Intubated 1/4, support was weaned to pressure support 1/16, and subsequently the patient was extubated 1/20 and has since weaned off of supplemental oxygen. Patient transfered to [facility] on 02/03/19.    PT Comments    Patient up in chair this AM when therapist arrived. She was able to initiate abdominal flexion 1x to reposition sling pad in preparation for lift back to bed. She continues to require total assist for transfer chair to bed and required Total assist +3 for bed mobility with rolling to complete bath. Pt performed ankle pumps in bed and was limited by fatigue and rolling Rt/Lt ~3x each. She did initiate some hip and knee flexion for bed mobility. She would benefit from intensive multidisciplinary therapy at CIR setting before she d/cs home with her family. PT will continue to follow acutely for safe mobility progression.    Follow Up Recommendations  CIR     Equipment Recommendations  Rolling walker with 5" wheels;3in1 (PT);Wheelchair (measurements PT);Wheelchair cushion (measurements PT);Hospital bed;Other (comment)(bariatric equipment. )    Recommendations for Other Services Rehab consult     Precautions / Restrictions Precautions Precautions: Fall Precaution Comments: morbid obesity Restrictions Weight Bearing Restrictions: No    Mobility  Bed Mobility Overal bed mobility: Needs Assistance Bed Mobility: Rolling;Sit to Supine Rolling: +2 for physical assistance;Total assist     Sit to supine: +2 for physical assistance;Total assist    General bed mobility comments: Pt assisted to supine in bed with maxi-move. Total assist +3 for roling in bed to remove pads and perform hygeine/bath. Pt continues to initiate reaching with UE's and to flex at knee and hip to initiate lower body roll.  Transfers Overall transfer level: Needs assistance               General transfer comment: Pt is up in chair this AM at PT arrival and required transfer back to bed via Max-move. Pt requires Total Assist for Bed to chair transfer with MaxiMove. Pt able to initiate raising hands to bring them together but required min assist to bring all the way to midline and was abel to lace fingers to keep hands in safe position on stomach.  Ambulation/Gait                 Stairs             Wheelchair Mobility    Modified Rankin (Stroke Patients Only)          Cognition Arousal/Alertness: Awake/alert Behavior During Therapy: WFL for tasks assessed/performed Overall Cognitive Status: Impaired/Different from baseline Area of Impairment: Safety/judgement        Safety/Judgement: Decreased awareness of deficits   Problem Solving: Slow processing        Exercises General Exercises - Lower Extremity Ankle Circles/Pumps: AAROM;Both;20 reps    General Comments        Pertinent Vitals/Pain Pain Assessment: Faces Faces Pain Scale: Hurts little more Pain Location: skin with bathing Pain Descriptors / Indicators: Grimacing Pain Intervention(s): Limited activity within patient's tolerance;Monitored during session;Repositioned           PT Goals (current goals can now be found in the care  plan section) Acute Rehab PT Goals Patient Stated Goal: to get home to her grandbabies PT Goal Formulation: With family Time For Goal Achievement: 02/13/19 Potential to Achieve Goals: Good Progress towards PT goals: Progressing toward goals(slowly)    Frequency    Min 3X/week      PT Plan Current plan remains appropriate     Co-evaluation PT/OT/SLP Co-Evaluation/Treatment: Yes Reason for Co-Treatment: Complexity of the patient's impairments (multi-system involvement);For patient/therapist safety;To address functional/ADL transfers PT goals addressed during session: Mobility/safety with mobility;Strengthening/ROM;Balance        AM-PAC PT "6 Clicks" Mobility   Outcome Measure  Help needed turning from your back to your side while in a flat bed without using bedrails?: Total Help needed moving from lying on your back to sitting on the side of a flat bed without using bedrails?: Total Help needed moving to and from a bed to a chair (including a wheelchair)?: Total Help needed standing up from a chair using your arms (e.g., wheelchair or bedside chair)?: Total Help needed to walk in hospital room?: Total Help needed climbing 3-5 steps with a railing? : Total 6 Click Score: 6    End of Session Equipment Utilized During Treatment: Oxygen(2L O2 [place]) Activity Tolerance: Patient limited by fatigue Patient left: with call bell/phone within reach;in chair;with chair alarm set Nurse Communication: Mobility status;Need for lift equipment PT Visit Diagnosis: Muscle weakness (generalized) (M62.81);Difficulty in walking, not elsewhere classified (R26.2)     Time: 1128(20 minutes non-billable)-1228 PT Time Calculation (min) (ACUTE ONLY): 60 min  Charges:  $Therapeutic Activity: 23-37 mins                    Verner Mould, DPT Physical Therapist with [facility] [phone]  02/06/2019 2:12 PM

## 2019-02-06 NOTE — Progress Notes (Signed)
Nutrition Follow-up  DOCUMENTATION CODES:   Morbid obesity  INTERVENTION:   -Ensure MAX Protein po daily, each supplement provides 150 kcal and 30 grams of protein   NUTRITION DIAGNOSIS:   Increased nutrient needs related to acute illness(COVID 19) as evidenced by estimated needs.  Ongoing.  GOAL:   Patient will meet greater than or equal to 90% of their needs  Progressing.  MONITOR:   PO intake, Supplement acceptance, Labs, Weight trends, I & O's  ASSESSMENT:   54 yo female admitted with COVID PNA on 1/1. Developed progressive hypoxemia and required intubation 1/4. PMH includes DM, HLD, obesity, anemia, sickle cell trait, HTN, NICM, CHF, neuropathy.  1/1: admitted d/t COVID-19+ 1/4: intubated 1/6: Cortrak placed, tip is at LOT 1/20: extubated 1/21: diet advanced to dysphagia 2 1/23: Transferred to [facility] 1/25: diet now on CHO modified  **RD working remotely**  Pt now consuming 75-100% of meals. Will order daily Ensure Max for additional protein to help with deconditioning from prolonged admission.  Admission weight: 392 lbs. Current weight: 365 lbs.   I/Os: +10L since 1/12 UOP: 1.3L x 24 hrs  Labs reviewed: CBGs: 270-305 Medications reviewed.  Diet Order:   Diet Order            Diet Carb Modified Fluid consistency: Thin; Room service appropriate? Yes  Diet effective now              EDUCATION NEEDS:   Not appropriate for education at this time  Skin:  Skin Assessment: Reviewed RN Assessment  Last BM:  1/23 -type 6  Height:   Ht Readings from Last 1 Encounters:  01/25/19 5\' 9"  (1.753 m)    Weight:   Wt Readings from Last 1 Encounters:  02/05/19 (!) 165.6 kg    Ideal Body Weight:  65.9 kg  BMI:  Body mass index is 53.91 kg/m.  Estimated Nutritional Needs:   Kcal:  1950-2150  Protein:  100-115g  Fluid:  2.1L/day  Clayton Bibles, MS, RD, LDN Inpatient Clinical Dietitian Pager: [phone] After Hours Pager: [phone]

## 2019-02-06 NOTE — Progress Notes (Signed)
PROGRESS NOTE  Kaitlin [name]  [id number] DOB: 1965-02-13 DOA: 01/12/2019 PCP: [place]  Brief Narrative: Patient is a 53 year old African-American female, morbidly obese, with past medical history significant for chronic HFpEF, T2DM, HTN, sickle cell trait and thymoma, recently resected.  Patient presented on 01/12/2019 to the ED with worsening dyspnea, cough, headache, and sore throat, found to be hypoxic with bilateral CXR infiltrates and positive SARS-CoV-2 antigen testing. Patient was admitted to [facility], treated with remdesivir and steroids, subsequently, tocilizumab 1/2 and convalescent plasma 1/4 when hypoxemia worsened from 8L > 15L requirement, hypercarbia and unresponsiveness developed, and the patient was intubated. Echocardiogram showed depressed LVEF to 45%-50% and diuresis was initiated. Antibiotics were added, then broadened due to shock requiring pressors and stress steroids. Central line was ultimately removed as well, though cultures remained negative and antibiotics were discontinued. Vent support was weaned to pressure support 1/16 and subsequently the patient was extubated 1/20 and has since weaned off of supplemental oxygen. PT/OT have recommended CIR rehabilitation, PM&R consulted. SLP has continued working with the patient and making dietary recommendations. Having completed 21 days of therapy, sustaining improvement, since positive covid testing, the patient's isolation may be discontinued. The patient is therefore to be transferred to [facility].  Inpatient hospital course  02/03/2019: Patient seen.  Patient becomes dyspneic while speaking.  Nursing staff is worried that this may be anxiety.  We will get a chest x-ray and will check cardiac BNP.  We will also give patient 1 dose of IV Lasix.  Further management will depend on above.  02/04/2019: Patient seen.  Tachycardia reported.  ABG and EKG done, results reviewed.  EKG showed sinus tachycardia.  The revealed  pH of 7.47, PCO2 of 36, PO2 of 80.  Edema of the extremities improved significantly.  On further questioning, patient informed me that she was on diuretics daily prior to admission.  Will start patient on oral torsemide daily.  Continue to monitor renal function.  Critical illness myopathy persists.  02/05/2019: Patient seen.  Patient is tolerating oral diuretics.  Renal function and electrolytes within normal range.  We will continue to monitor closely.  We will also continue to monitor volume status closely.  Blood sugar is not optimally controlled.  Blood sugar today was 262.  Will discontinue subcutaneous Levemir 30 units once daily.  Will start patient on subcutaneous Lantus 20 units twice daily.  We will continue to monitor blood sugar closely.  Continue other diabetes management.  Eventual plan is for rehab when a bed is available.  02/06/2019.  Blood sugar still continues to run above 200.  Will titrate up mealtime insulin to 12 units 3 times daily plus sliding scale We will continue with dietary control as well Patient is sitting out of chair, alert and oriented and not in acute distress. [place] working on placement to rehab.  Assessment & Plan: Principal Problem:   Acute respiratory distress syndrome (ARDS) due to COVID-19 virus [facility]) Active Problems:   Hypertension   HLD (hyperlipidemia)   Insulin dependent type 2 diabetes mellitus ([place])   Encounter for central line placement   Grade I diastolic dysfunction   Hypotension   Acute respiratory failure with hypoxemia (HCC)   Shock circulatory (HCC)   Septic shock (HCC)   Hypernatremia  1.  Acute hypoxemic respiratory failure due to ARDS due to covid-19 pneumonia:  -SARS-CoV-2 Ag positive on 1/1.  - Wean supplemental oxygen as able. Was weaned to pressure support 1/16, extubated 1/20, on room  air this morning. Afebrile. Goal SpO2 85-95% - continue incentive spirometry - Positive test was 21 days ago, pt has improved, last steroids > 1  week ago. Ok to DC isolation.  - Completed remdesivir x5 days, steroids x10 days, received tocilizumab 1/2, convalescent plasma 1/4. - Enoxaparin to return to standard weight-based prophylactic dose.  02/03/2019: Continue to monitor respiratory symptoms closely.  Pursue chest x-ray, check cardiac BNP.  Low threshold to repeat ABG.  Will give 1 dose of IV Lasix. Patient is stable clinically and not in acute distress. Continue with current management .  2.  Dysphagia:   - Continue SLP evaluations, dysphagia diet  3.  Acute HFrEF, suspected to be due to sepsis/covid-related cardiomyopathy, HTN:  Echocardiogram of 01/31/2019 showed ejection fraction of 45 to 50% (NYHA I-II, ACC/AHA B Continuing guideline directed medical therapy with aspirin, statin, beta-blocker and ACE/ARB's Continue with fluid restriction to 1500 cc/Kaitlin Continue with torsemide  Low-sodium/2 g diet Daily weight - 4.  Shock, cardiogenic and septic:  Weaned from pressors.  Blood pressure appears upticking. Will restart antihypertensive medications.   Central line removed 1/14. Volume status is stable.  - Resolved.  5.  Poorly-controlled diabetes mellitus type 2 on insulin pump as outpatient:  Will continue with sliding scale insulin high-dose Lantus twice daily Mealtime insulin 12 units 3 times daily plus sliding scale Hypoglycemic protocol  6.  Debility .  Likely due to critical illness myopathy -Severe.  - PT/OT, CIR would be ideal setting for rehabilitation.  Awaiting placement to subacute rehab  Morbid obesity: BMI 53.9 Dietary and lifestyle modification  Hypernatremia:   Resolved.    Hypokalemia:  Monitor potassium and replete as necessary.  Anemia of chronic disease  Monitor CBC and transfuse if hemoglobin is less than 7.0.   DVT prophylaxis: Lovenox 0.5mg /kg q24h Code Status: Full Family Communication: None at bedside . Disposition Plan: Patient will need discharge to subacute rehab. No barriers  to discharge as soon as bed is available.  Consultants:   PCCM  ID: discussed case with Dr. Graylon Good by phone 02/02/2019 for recommendations regarding isolation.  PM&R  Procedures:  ETT 1/4 >> 1/20 L IJ CVL 1/5 >> 1/14 Arterial R radial 1/6 >> 1/13  Antimicrobials/Covid therapies: Remdesivir 1/1 >> 1/6  Tocilizumab 1/2 x1  Decadron 1/1 >> to stress dose steroids 1/7 >> stopped 1/11  Vanco 1/4 >> 1/10  Cefepime 1/4 >> 1/7 Meropenem 1/7 >> 1/10  Cipro 1/7 >> 1/10  Subjective:  Objective: Vitals:   02/05/19 2133 02/06/19 0451 02/06/19 1421 02/06/19 1454  BP: 114/74 119/72 108/69 (!) 132/98  Pulse: (!) 104 (!) 107 (!) 111 (!) 111  Resp: 20 20 18 18   Temp: 98.8 F (37.1 C) 98.5 F (36.9 C) 98.9 F (37.2 C) 97.8 F (36.6 C)  TempSrc:   Oral   SpO2: 95% 97% 96% 100%  Weight:      Height:        Intake/Output Summary (Last 24 hours) at 02/06/2019 1646 Last data filed at 02/06/2019 1337 Gross per 24 hour  Intake 240 ml  Output 1300 ml  Net -1060 ml   Filed Weights   02/03/19 0500 02/04/19 0605 02/05/19 0513  Weight: (!) 164.3 kg (!) 166.1 kg (!) 165.6 kg    Gen: Morbidly obese. Myopathy (guess from illness).  Pulm: Decreased air entry  CV: S1S2 GI: Abdomen soft, non-tender, non-distended, with normoactive bowel sounds. No organomegaly or masses felt. Ext: Mild 1+ pitting edema .   Skin:  Nasal abrasion without erythema or exudate. No other rashes, lesions or ulcers on visualized skin Neuro: Awake and alert.  Decreased muscle strength bilateral lower extremities  Data Reviewed: I have personally reviewed following labs and imaging studies  CBC: Recent Labs  Lab 01/31/19 0125 02/01/19 0541 02/04/19 0531 02/05/19 0517  WBC 13.3* 11.3* 8.9 9.4  NEUTROABS 9.0*  --  4.3 5.1  HGB 9.8* 11.0* 11.5* 11.8*  HCT 30.9* 34.8* 36.1 36.2  MCV 84.0 83.1 84.7 84.0  PLT 122* 204 295 [id number]   Basic Metabolic Panel: Recent Labs  Lab 01/31/19 0125 02/01/19 0541  02/02/19 0258 02/04/19 0531 02/05/19 0517  NA 146* 148* 148* 140 140  K 3.2* 3.3* 3.8 3.9 3.8  CL 107 105 109 103 101  CO2 32 31 29 27 28   GLUCOSE 179* 81 70 215* 262*  BUN 24* 15 14 14 15   CREATININE 0.56 0.47 0.59 0.66 0.71  CALCIUM 8.5* 8.7* 8.9 8.9 9.0  MG  --   --  2.4 2.2 2.0  PHOS  --   --   --  4.2 4.0   GFR: Estimated Creatinine Clearance: 139.2 mL/min (by C-G formula based on SCr of 0.71 mg/dL). Liver Function Tests: Recent Labs  Lab 02/04/19 0531 02/05/19 0517  ALBUMIN 2.8* 2.8*   No results for input(s): LIPASE, AMYLASE in the last 168 hours. No results for input(s): AMMONIA in the last 168 hours. Coagulation Profile: Recent Labs  Lab 01/31/19 0125  INR 1.2   Cardiac Enzymes: No results for input(s): CKTOTAL, CKMB, CKMBINDEX, TROPONINI in the last 168 hours. BNP (last 3 results) No results for input(s): PROBNP in the last 8760 hours. HbA1C: No results for input(s): HGBA1C in the last 72 hours. CBG: Recent Labs  Lab 02/05/19 1634 02/05/19 2137 02/06/19 0735 02/06/19 1159 02/06/19 1318  GLUCAP 212* 246* 291* 305* 270*   Lipid Profile: No results for input(s): CHOL, HDL, LDLCALC, TRIG, CHOLHDL, LDLDIRECT in the last 72 hours. Thyroid Function Tests: No results for input(s): TSH, T4TOTAL, FREET4, T3FREE, THYROIDAB in the last 72 hours. Anemia Panel: No results for input(s): VITAMINB12, FOLATE, FERRITIN, TIBC, IRON, RETICCTPCT in the last 72 hours. Urine analysis:    Component Value Date/Time   COLORURINE YELLOW 01/31/2013 1138   APPEARANCEUR CLEAR 01/31/2013 1138   LABSPEC 1.029 01/31/2013 1138   PHURINE 6.0 01/31/2013 1138   GLUCOSEU NEG 01/31/2013 1138   HGBUR NEG 01/31/2013 1138   BILIRUBINUR NEG 01/31/2013 1138   KETONESUR NEG 01/31/2013 1138   PROTEINUR NEG 01/31/2013 1138   UROBILINOGEN 0.2 01/31/2013 1138   NITRITE NEG 01/31/2013 1138   LEUKOCYTESUR NEG 01/31/2013 1138   No results found for this or any previous visit (from the past  240 hour(s)).    Radiology Studies: No results found.  Scheduled Meds: . aspirin  81 mg Oral Daily  . carvedilol  12.5 mg Oral BID WC  . chlorhexidine  15 mL Mouth/Throat BID  . enoxaparin (LOVENOX) injection  80 mg Subcutaneous Q24H  . insulin aspart  0-20 Units Subcutaneous TID WC  . insulin aspart  10 Units Subcutaneous TID WC  . insulin glargine  20 Units Subcutaneous BID  . neomycin-bacitracin-polymyxin   Topical Daily  . Ensure Max Protein  11 oz Oral Daily  . rosuvastatin  40 mg Oral Daily  . torsemide  20 mg Oral BID   Continuous Infusions:   LOS: 25 days   Time spent: 35 minutes.  Elie Confer, MD [facility] [email or web address]  02/06/2019, 4:46 PM

## 2019-02-06 NOTE — Progress Notes (Signed)
Occupational Therapy Treatment Patient Details Name: Kaitlin [name] MRN: [id number] DOB: 04-Oct-1965 Today's Date: 02/06/2019    History of present illness 54 y.o. female with medical history significant of anemia, bronchitis, bilateral cataracts, chronic diastolic CHF, type 2 diabetes, hyperlipidemia, hypertension, leukocytosis, morbid obesity, peripheral neuropathy, sickle cell trait, history of thymoma with history of thymoma resection . Intubated 1/4, support was weaned to pressure support 1/16, and subsequently the patient was extubated 1/20 and has since weaned off of supplemental oxygen. Patient transfered to [facility] on 02/03/19.   OT comments  Patient up in chair this AM when therapist arrived. She was able to initiate abdominal flexion 1x to reposition sling pad in preparation for lift back to bed. She continues to require total assist for transfer chair to bed and required Total assist +3 for bed mobility with rolling to complete bath. Pt performed ankle pumps in bed and was limited by fatigue and rolling Rt/Lt ~3x each. She did initiate some hip and knee flexion for bed mobility. She would benefit from intensive multidisciplinary therapyat CIR settingbefore she d/cs home with her family. OT will continue to follow acutely for safe mobility progression.  Follow Up Recommendations  CIR;Supervision/Assistance - 24 hour    Equipment Recommendations       Recommendations for Other Services Rehab consult    Precautions / Restrictions Precautions Precautions: Fall Precaution Comments: morbid obesity Restrictions Weight Bearing Restrictions: No       Mobility Bed Mobility Overal bed mobility: Needs Assistance Bed Mobility: Rolling;Sit to Supine Rolling: +2 for physical assistance;Total assist     Sit to supine: +2 for physical assistance;Total assist   General bed mobility comments: Pt assisted to supine in bed with maxi-move. Total assist +3 for roling in bed to remove pads and  perform hygeine/bath. Pt continues to initiate reaching with UE's and to flex at knee and hip to initiate lower body roll.  Transfers Overall transfer level: Needs assistance               General transfer comment: Pt is up in chair this AM at OT arrival and required transfer back to bed via Max-move. Pt requires Total Assist for Bed to chair transfer with MaxiMove. Pt able to initiate raising hands to bring them together but required min assist to bring all the way to midline and was abel to lace fingers to keep hands in safe position on stomach.    Balance                                           ADL either performed or assessed with clinical judgement   ADL          Limited ADL focus this OT session due to pt being in chair so long.  Will refocus ADL focus and BUE strengthening next OT session                                               Cognition Arousal/Alertness: Awake/alert Behavior During Therapy: WFL for tasks assessed/performed Overall Cognitive Status: Impaired/Different from baseline Area of Impairment: Safety/judgement                         Safety/Judgement: Decreased awareness of  deficits   Problem Solving: Slow processing          Exercises General Exercises - Lower Extremity Ankle Circles/Pumps: AAROM;Both;20 reps   Shoulder Instructions       General Comments      Pertinent Vitals/ Pain       Pain Assessment: Faces Faces Pain Scale: Hurts little more Pain Location: back while in lift sling Pain Descriptors / Indicators: Grimacing Pain Intervention(s): Monitored during session         Frequency  Min 3X/week        Progress Toward Goals  OT Goals(current goals can now be found in the care plan section)  Progress towards OT goals: Progressing toward goals  Acute Rehab OT Goals Patient Stated Goal: to get home to her grandbabies  Plan Discharge plan remains appropriate     Co-evaluation      Reason for Co-Treatment: Complexity of the patient's impairments (multi-system involvement);For patient/therapist safety;To address functional/ADL transfers PT goals addressed during session: Mobility/safety with mobility;Strengthening/ROM;Balance           End of Session Equipment Utilized During Treatment: Other (comment)(maxi move lift)      Activity Tolerance     Patient Left in bed;with nursing/sitter in room   Nurse Communication Mobility status  Need for pt to be up daily in 2-3 hour increments.          Time: 1130-1150 OT Time Calculation (min): 20 min  Charges: OT General Charges $OT Visit: 1 Visit OT Treatments $Self Care/Home Management : 8-22 mins  Kari Baars, [place] [phone] Office- [phone], Edwena Felty D 02/06/2019, 2:51 PM

## 2019-02-06 NOTE — Progress Notes (Signed)
Inpatient Diabetes Program Recommendations  AACE/ADA: New Consensus Statement on Inpatient Glycemic Control (2015)  Target Ranges:  Prepandial:   less than 140 mg/dL      Peak postprandial:   less than 180 mg/dL (1-2 hours)      Critically ill patients:  140 - 180 mg/dL   Lab Results  Component Value Date   GLUCAP 291 (H) 02/06/2019   HGBA1C 7.1 (H) 01/13/2019    Review of Glycemic Control  Results for Kaitlin [name], Kaitlin [name] (MRN [id number]) as of 02/06/2019 09:43  Ref. Range 02/05/2019 08:02 02/05/2019 12:43 02/05/2019 16:34 02/05/2019 21:37 02/06/2019 07:35  Glucose-Capillary Latest Ref Range: 70 - 99 mg/dL 273 (H) 292 (H) 212 (H) 246 (H) 291 (H)   Diabetes history: DM 2 Outpatient Diabetes medications: insulin pump, Metformin 1000 gm bid, Semaglutide 0.25 mg weekly Current orders for Inpatient glycemic control:  Lantus 20 units bid Novolog 0-20 units tid  Novolog 10 units tid meal coverage  Inpatient Diabetes Program Recommendations:    Glucose trends consistently >200. Consider increasing Lantus to 26 units bid.  Thanks,  Tama Headings RN, MSN, BC-ADM Inpatient Diabetes Coordinator Team Pager [phone] (8a-5p)

## 2019-02-06 NOTE — Progress Notes (Signed)
This morning, pt was asked if she wanted to get into bed, pt stated no. When breakfast came, RN asked again if pt wanted to get into bed to help sit up better for breakfast, pt again stated no, she felt more comfortable in the chair. The HCT has asked several times if the pt wanted to get back into bed and pt declined all times. Pt is alert and oriented x4.

## 2019-02-06 NOTE — Consult Note (Signed)
WOC Nurse Consult Note: Reason for Consult:Partial thickness blisters, heels at risk for skin breakdown due to deconditioning, moisture associated skin damage, deconditioning Wound type:partial thickness blistering, etiology unknown Pressure Injury POA: N/A Measurement: Bedside RN Verdis Frederickson) will capture measurement of new mid-thoracic partial thickness blister. Sever other smaller lesions, both ruptured and non ruptured blisters are covered with silicone foam dressings. Wound [id number], moist Drainage (amount, consistency, odor) small, serous Periwound:intact Dressing procedure/placement/frequency: Patient continues with deconditioned state having been transferred from [place]. Is awaiting CIR placement. Turning in a standard bed is insufficient for offloading, so we will obtain a bariatric mattress replacement with low air loss feature to assist with microclimate management. DermaTherapy underpads are to be used for low friction coefficient (CoF) and antimicrobial properties. Bilateral pressure redistribution heel boots are provided to prevent pressure injuries by floatation and correct lateral rotation of LEs. Our house antimicrobial textile (InterDry) is added to wick moisture from the subpannicular, bilateral inguinal and inframammary areas.  [place] nursing team will not follow, but will remain available to this patient, the nursing and medical teams.  Please re-consult if needed. Thanks, Maudie Flakes, MSN, RN, [place], Arther Abbott  Pager# [phone]

## 2019-02-07 LAB — COMPREHENSIVE METABOLIC PANEL
ALT: 34 U/L (ref 0–44)
AST: 20 U/L (ref 15–41)
Albumin: 3.1 g/dL — ABNORMAL LOW (ref 3.5–5.0)
Alkaline Phosphatase: 63 U/L (ref 38–126)
Anion gap: 13 (ref 5–15)
BUN: 17 mg/dL (ref 6–20)
CO2: 30 mmol/L (ref 22–32)
Calcium: 9.4 mg/dL (ref 8.9–10.3)
Chloride: 99 mmol/L (ref 98–111)
Creatinine, Ser: 0.86 mg/dL (ref 0.44–1.00)
GFR calc Af Amer: >60 mL/min (ref >60)
GFR calc non Af Amer: >60 mL/min (ref >60)
Glucose, Bld: 199 mg/dL — ABNORMAL HIGH (ref 70–99)
Potassium: 3.6 mmol/L (ref 3.5–5.1)
Sodium: 142 mmol/L (ref 135–145)
Total Bilirubin: 1.1 mg/dL (ref 0.3–1.2)
Total Protein: 6.3 g/dL — ABNORMAL LOW (ref 6.5–8.1)

## 2019-02-07 LAB — GLUCOSE, CAPILLARY
Glucose-Capillary: 249 mg/dL — ABNORMAL HIGH (ref 70–99)
Glucose-Capillary: 269 mg/dL — ABNORMAL HIGH (ref 70–99)
Glucose-Capillary: 217 mg/dL — ABNORMAL HIGH (ref 70–99)

## 2019-02-07 LAB — MAGNESIUM: Magnesium: 1.8 mg/dL (ref 1.7–2.4)

## 2019-02-07 LAB — PHOSPHORUS: Phosphorus: 4.5 mg/dL (ref 2.5–4.6)

## 2019-02-07 LAB — CBC
HCT: 38.4 % (ref 36.0–46.0)
Hemoglobin: 12.3 g/dL (ref 12.0–15.0)
MCH: 26.8 pg (ref 26.0–34.0)
MCHC: 32 g/dL (ref 30.0–36.0)
MCV: 83.7 fL (ref 80.0–100.0)
Platelets: 435 10*3/uL — ABNORMAL HIGH (ref 150–400)
RBC: 4.59 MIL/uL (ref 3.87–5.11)
RDW: 20.6 % — ABNORMAL HIGH (ref 11.5–15.5)
WBC: 9.7 10*3/uL (ref 4.0–10.5)
nRBC: 0 % (ref 0.0–0.2)

## 2019-02-07 MED ORDER — ASCORBIC ACID 500 MG PO TABS
1500.0000 mg | ORAL_TABLET | Freq: Every day | ORAL | Status: DC
  Administered 2019-02-07 – 2019-02-09 (×3): 1500 mg via ORAL
  Filled 2019-02-07 (×3): qty 3

## 2019-02-07 MED ORDER — INSULIN ASPART 100 UNIT/ML ~~LOC~~ SOLN
14.0000 [IU] | Freq: Three times a day (TID) | SUBCUTANEOUS | Status: DC
  Administered 2019-02-07 – 2019-02-08 (×3): 14 [IU] via SUBCUTANEOUS

## 2019-02-07 MED ORDER — INSULIN GLARGINE 100 UNIT/ML ~~LOC~~ SOLN
22.0000 [IU] | Freq: Two times a day (BID) | SUBCUTANEOUS | Status: DC
  Administered 2019-02-07 – 2019-02-08 (×2): 22 [IU] via SUBCUTANEOUS
  Filled 2019-02-07 (×3): qty 0.22

## 2019-02-07 MED ORDER — VITAMIN D3 25 MCG (1000 UNIT) PO TABS
5000.0000 [IU] | ORAL_TABLET | Freq: Every day | ORAL | Status: DC
  Administered 2019-02-07 – 2019-02-09 (×3): 5000 [IU] via ORAL
  Filled 2019-02-07 (×3): qty 5

## 2019-02-07 MED ORDER — FERROUS SULFATE 325 (65 FE) MG PO TABS
325.0000 mg | ORAL_TABLET | Freq: Every day | ORAL | Status: DC
  Administered 2019-02-08 – 2019-02-09 (×2): 325 mg via ORAL
  Filled 2019-02-07 (×2): qty 1

## 2019-02-07 NOTE — Progress Notes (Signed)
Inpatient Diabetes Program Recommendations  AACE/ADA: New Consensus Statement on Inpatient Glycemic Control (2015)  Target Ranges:  Prepandial:   less than 140 mg/dL      Peak postprandial:   less than 180 mg/dL (1-2 hours)      Critically ill patients:  140 - 180 mg/dL   Lab Results  Component Value Date   GLUCAP 249 (H) 02/07/2019   HGBA1C 7.1 (H) 01/13/2019    Review of Glycemic Control  Results for BONNITA, GLATTER (MRN [id number]) as of 02/07/2019 09:01  Ref. Range 02/06/2019 07:35 02/06/2019 11:59 02/06/2019 13:18 02/06/2019 17:05 02/06/2019 20:53 02/07/2019 07:36  Glucose-Capillary Latest Ref Range: 70 - 99 mg/dL 291 (H) 305 (H) 270 (H) 191 (H) 131 (H) 249 (H)   Diabetes history: DM 2 Outpatient Diabetes medications: insulin pump, Metformin 1000 gm bid, Semaglutide 0.25 mg weekly Current orders for Inpatient glycemic control:  Lantus 20 units bid Novolog 0-20 units tid  Novolog 10 units tid meal coverage  Inpatient Diabetes Program Recommendations:    Glucose trends consistently >200. Consider increasing Lantus to 25 units bid.  Thanks,  Tama Headings RN, MSN, BC-ADM Inpatient Diabetes Coordinator Team Pager [phone] (8a-5p)

## 2019-02-07 NOTE — Progress Notes (Signed)
PROGRESS NOTE  Kaitlin [name]  [id number] DOB: 1965-06-17 DOA: 01/12/2019 PCP: [facility]  Brief Narrative: Patient is a 54 year old African-American female, morbidly obese, with past medical history significant for chronic HFpEF, T2DM, HTN, sickle cell trait and thymoma, recently resected.  Patient presented on 01/12/2019 to the ED with worsening dyspnea, cough, headache, and sore throat, found to be hypoxic with bilateral CXR infiltrates and positive SARS-CoV-2 antigen testing. Patient was admitted to [facility], treated with remdesivir and steroids, subsequently, tocilizumab 1/2 and convalescent plasma 1/4 when hypoxemia worsened from 8L > 15L requirement, hypercarbia and unresponsiveness developed, and the patient was intubated. Echocardiogram showed depressed LVEF to 45%-50% and diuresis was initiated. Antibiotics were added, then broadened due to shock requiring pressors and stress steroids. Central line was ultimately removed as well, though cultures remained negative and antibiotics were discontinued. Vent support was weaned to pressure support 1/16 and subsequently the patient was extubated 1/20 and has since weaned off of supplemental oxygen. PT/OT have recommended CIR rehabilitation, PM&R consulted. SLP has continued working with the patient and making dietary recommendations. Having completed 21 days of therapy, sustaining improvement, since positive covid testing, the patient's isolation may be discontinued. The patient is therefore to be transferred to [facility].  Inpatient hospital course  02/03/2019: Patient seen.  Patient becomes dyspneic while speaking.  Nursing staff is worried that this may be anxiety.  We will get a chest x-ray and will check cardiac BNP.  We will also give patient 1 dose of IV Lasix.  Further management will depend on above.  02/04/2019: Patient seen.  Tachycardia reported.  ABG and EKG done, results reviewed.  EKG showed sinus tachycardia.  The revealed  pH of 7.47, PCO2 of 36, PO2 of 80.  Edema of the extremities improved significantly.  On further questioning, patient informed me that she was on diuretics daily prior to admission.  Will start patient on oral torsemide daily.  Continue to monitor renal function.  Critical illness myopathy persists.  02/05/2019: Patient seen.  Patient is tolerating oral diuretics.  Renal function and electrolytes within normal range.  We will continue to monitor closely.  We will also continue to monitor volume status closely.  Blood sugar is not optimally controlled.  Blood sugar today was 262.  Will discontinue subcutaneous Levemir 30 units once daily.  Will start patient on subcutaneous Lantus 20 units twice daily.  We will continue to monitor blood sugar closely.  Continue other diabetes management.  Eventual plan is for rehab when a bed is available.  02/06/2019.  Blood sugar still continues to run above 200.  Will titrate up mealtime insulin to 12 units 3 times daily plus sliding scale We will continue with dietary control as well Patient is sitting out of chair, alert and oriented and not in acute distress  02/07/2019: Blood sugar still running above 200.  Patient stable and not in acute distress. I will titrate mealtime insulin to 14 units 3 times daily plus sliding scale Continue to monitor blood sugar with fingersticks before meals and at bedtime lantus up to 22 units BID COC working on placement to rehab.  Assessment & Plan: Principal Problem:   Acute respiratory distress syndrome (ARDS) due to COVID-19 virus [facility]) Active Problems:   Hypertension   HLD (hyperlipidemia)   Insulin dependent type 2 diabetes mellitus ([place])   Encounter for central line placement   Grade I diastolic dysfunction   Hypotension   Acute respiratory failure with hypoxemia ([place])  Shock circulatory ([place])   Septic shock ([facility])   Hypernatremia  1.  Acute hypoxemic respiratory failure due to ARDS due to covid-19 pneumonia: On 3  L O2 by nasal cannula -SARS-CoV-2 Ag positive on 1/1.  - Wean supplemental oxygen as able. Was weaned to pressure support 1/16, extubated 1/20, on room air this morning. Afebrile. Goal SpO2 85-95% - continue incentive spirometry - Positive test was 21 days ago, pt has improved, last steroids > 1 week ago. Ok to DC isolation.  - Completed remdesivir x5 days, steroids x10 days, received tocilizumab 1/2, convalescent plasma 1/4. - Enoxaparin to return to standard weight-based prophylactic dose.  02/03/2019: Continue to monitor respiratory symptoms closely.  Pursue chest x-ray, check cardiac BNP.  Low threshold to repeat ABG.  Will give 1 dose of IV Lasix. Patient is stable clinically and not in acute distress. Wean off oxygen .  2.  Dysphagia:   Continue with dysphagia 1 diet  3.  Acute HFrEF, suspected to be due to sepsis/covid-related cardiomyopathy, HTN:  Echocardiogram of 01/31/2019 showed ejection fraction of 45 to 50% (NYHA I-II, ACC/AHA B Continuing guideline directed medical therapy with aspirin, statin, beta-blocker and ACE/ARB's Continue with fluid restriction to 1500 cc/day Continue with torsemide  Low-sodium/2 g diet Daily weight - 4.  Shock, cardiogenic and septic:  Weaned from pressors.  Blood pressure appears upticking. Will restart antihypertensive medications.   Central line removed 1/14. Volume status is stable.  - Resolved.  5.  Poorly-controlled diabetes mellitus type 2 on insulin pump as outpatient:  Will continue with sliding scale insulin high-dose Lantus twice daily Mealtime insulin 12 units 3 times daily plus sliding scale Hypoglycemic protocol  6.  Debility .  Likely due to critical illness myopathy -Severe.  - PT/OT, CIR would be ideal setting for rehabilitation.  Awaiting placement to subacute rehab  Morbid obesity: BMI 53.9 Dietary and lifestyle modification  Hypernatremia:   Resolved.    Hypokalemia:  Monitor potassium and replete as  necessary.  Anemia of chronic disease  Monitor CBC and transfuse if hemoglobin is less than 7.0.   DVT prophylaxis: Lovenox 0.5mg /kg q24h Code Status: Full Family Communication: None at bedside . Disposition Plan: Patient will need discharge to subacute rehab. No barriers to discharge as soon as bed is available.  Consultants:   PCCM  ID: discussed case with Dr. Graylon Good by phone 02/02/2019 for recommendations regarding isolation.  PM&R  Procedures:  ETT 1/4 >> 1/20 L IJ CVL 1/5 >> 1/14 Arterial R radial 1/6 >> 1/13  Antimicrobials/Covid therapies: Remdesivir 1/1 >> 1/6  Tocilizumab 1/2 x1  Decadron 1/1 >> to stress dose steroids 1/7 >> stopped 1/11  Vanco 1/4 >> 1/10  Cefepime 1/4 >> 1/7 Meropenem 1/7 >> 1/10  Cipro 1/7 >> 1/10  Subjective:  Objective: Vitals:   02/06/19 1906 02/06/19 2228 02/07/19 0452 02/07/19 0823  BP: (!) 127/52 110/65 121/79 117/75  Pulse: 91 (!) 102 (!) 105 (!) 107  Resp: 20 17 18 20   Temp: 98.2 F (36.8 C) 98.3 F (36.8 C) (!) 97.5 F (36.4 C) 97.7 F (36.5 C)  TempSrc: Oral  Oral Oral  SpO2:  97% 98% 100%  Weight:      Height:        Intake/Output Summary (Last 24 hours) at 02/07/2019 1313 Last data filed at 02/07/2019 1100 Gross per 24 hour  Intake 780 ml  Output 3450 ml  Net -2670 ml   Filed Weights   02/03/19 0500 02/04/19 0605 02/05/19 [id number]  Weight: (!) 164.3 kg (!) 166.1 kg (!) 165.6 kg    Gen: Morbidly obese. Myopathy (guess from illness).  Pulm: Decreased air entry  CV: S1S2 GI: Abdomen soft, non-tender, non-distended, with normoactive bowel sounds. No organomegaly or masses felt. Ext: Mild 1+ pitting edema .   Skin: Nasal abrasion without erythema or exudate. No other rashes, lesions or ulcers on visualized skin Neuro: Awake and alert.  Decreased muscle strength bilateral lower extremities  Data Reviewed: I have personally reviewed following labs and imaging studies  CBC: Recent Labs  Lab 02/01/19 0541  02/04/19 0531 02/05/19 0517 02/07/19 0428  WBC 11.3* 8.9 9.4 9.7  NEUTROABS  --  4.3 5.1  --   HGB 11.0* 11.5* 11.8* 12.3  HCT 34.8* 36.1 36.2 38.4  MCV 83.1 84.7 84.0 83.7  PLT 204 295 358 [id number]*   Basic Metabolic Panel: Recent Labs  Lab 02/01/19 0541 02/02/19 0258 02/04/19 0531 02/05/19 0517 02/07/19 0428  NA 148* 148* 140 140 142  K 3.3* 3.8 3.9 3.8 3.6  CL 105 109 103 101 99  CO2 31 29 27 28 30   GLUCOSE 81 70 215* 262* 199*  BUN 15 14 14 15 17   CREATININE 0.47 0.59 0.66 0.71 0.86  CALCIUM 8.7* 8.9 8.9 9.0 9.4  MG  --  2.4 2.2 2.0 1.8  PHOS  --   --  4.2 4.0 4.5   GFR: Estimated Creatinine Clearance: 129.5 mL/min (by C-G formula based on SCr of 0.86 mg/dL). Liver Function Tests: Recent Labs  Lab 02/04/19 0531 02/05/19 0517 02/07/19 0428  AST  --   --  20  ALT  --   --  34  ALKPHOS  --   --  63  BILITOT  --   --  1.1  PROT  --   --  6.3*  ALBUMIN 2.8* 2.8* 3.1*   No results for input(s): LIPASE, AMYLASE in the last 168 hours. No results for input(s): AMMONIA in the last 168 hours. Coagulation Profile: No results for input(s): INR, PROTIME in the last 168 hours. Cardiac Enzymes: No results for input(s): CKTOTAL, CKMB, CKMBINDEX, TROPONINI in the last 168 hours. BNP (last 3 results) No results for input(s): PROBNP in the last 8760 hours. HbA1C: No results for input(s): HGBA1C in the last 72 hours. CBG: Recent Labs  Lab 02/06/19 1318 02/06/19 1705 02/06/19 2053 02/07/19 0736 02/07/19 1126  GLUCAP 270* 191* 131* 249* 269*   Lipid Profile: No results for input(s): CHOL, HDL, LDLCALC, TRIG, CHOLHDL, LDLDIRECT in the last 72 hours. Thyroid Function Tests: No results for input(s): TSH, T4TOTAL, FREET4, T3FREE, THYROIDAB in the last 72 hours. Anemia Panel: No results for input(s): VITAMINB12, FOLATE, FERRITIN, TIBC, IRON, RETICCTPCT in the last 72 hours. Urine analysis:    Component Value Date/Time   COLORURINE YELLOW 01/31/2013 1138   APPEARANCEUR  CLEAR 01/31/2013 1138   LABSPEC 1.029 01/31/2013 1138   PHURINE 6.0 01/31/2013 1138   GLUCOSEU NEG 01/31/2013 1138   HGBUR NEG 01/31/2013 1138   BILIRUBINUR NEG 01/31/2013 1138   KETONESUR NEG 01/31/2013 1138   PROTEINUR NEG 01/31/2013 1138   UROBILINOGEN 0.2 01/31/2013 1138   NITRITE NEG 01/31/2013 1138   LEUKOCYTESUR NEG 01/31/2013 1138   No results found for this or any previous visit (from the past 240 hour(s)).    Radiology Studies: No results found.  Scheduled Meds: . ascorbic acid  1,500 mg Oral Daily  . aspirin  81 mg Oral Daily  . carvedilol  12.5 mg  Oral BID WC  . chlorhexidine  15 mL Mouth/Throat BID  . cholecalciferol  5,000 Units Oral Daily  . enoxaparin (LOVENOX) injection  80 mg Subcutaneous Q24H  . [START ON 02/08/2019] ferrous sulfate  325 mg Oral Q breakfast  . insulin aspart  0-20 Units Subcutaneous TID WC  . insulin aspart  14 Units Subcutaneous TID WC  . insulin glargine  22 Units Subcutaneous BID  . neomycin-bacitracin-polymyxin   Topical Daily  . Ensure Max Protein  11 oz Oral Daily  . rosuvastatin  40 mg Oral Daily  . torsemide  20 mg Oral BID   Continuous Infusions:   LOS: 26 days   Time spent: 35 minutes.  Elie Confer, MD [facility] [email or web address] 02/07/2019, 1:13 PM

## 2019-02-07 NOTE — PMR Pre-admission (Signed)
PMR Admission Coordinator Pre-Admission Assessment  Patient: Kaitlin [name] is an 54 y.o., female MRN: [id number] DOB: 1965-01-30 Height: 5\' 9"  (175.3 cm) Weight: (!) 167.4 kg              Insurance Information HMO:     PPO:      PCP:      IPA:      80/20:      OTHER:  PRIMARY: Medicaid [place]      Policy#: [id number]      Subscriber: patient Coverage Code: [place] Name:       Phone#:      Fax#:  Pre-Cert#:       Employer:  Benefits:  Phone #: verified eligibility via automated system: [phone]     Name: verified eligibiltiy online via [place] on 02/07/19 Eff. Date: verified eligibility on 02/07/19     Deduct:      Out of Pocket Max:       Life Max:  CIR: Covered per Medicaid guidelines      SNF:  Outpatient:      Co-Pay:  Home Health:       Co-Pay:  DME:      Co-Pay:  Providers:  SECONDARY: None      Policy#:       Subscriber:  CM Name:       Phone#:      Fax#:  Pre-Cert#:       Employer:  Benefits:  Phone #:      Name:  Eff. Date:      Deduct:       Out of Pocket Max:       Life Max: CIR:       SNF:  Outpatient:      Co-Pay:  Home Health:       Co-Pay:  DME:     Co-Pay:  Medicaid Application Date:       Case Manager: Disability Application Date:       Case Worker:   The "Data Collection Information Summary" for patients in Inpatient Rehabilitation Facilities with attached "Privacy Act [place] Records" was provided and verbally reviewed with: N/A  Emergency Contact Information Contact Information    Name Relation Home Work Mobile   [place] Daughter [phone]  [phone]     Current Medical History  Patient Admitting Diagnosis: 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 Pt presented to the ED on 01/12/19 with worsening dyspnea, cough, headache, and sore  throat, found to be hypoxic with bilateral infiltrates with + COVID test. Pt was [facility] to [facility] and treated with remdesivir and steroids, tocilizumab, and convalescent plasma. The patient required intubation when her hypoxemia worsened from 8L to 15L and pt became unresponsive. Pt was diuresed, antibiotics added, pressors started and stress steroids. The patient was extubated on 1/20. As the patient had completed 21 days of therapy with improvement since last positive Covid test, the pt's isolation was discontinued and she was transferred to [facility] for further management. Since hospitalization, pt has been having issues with blood sugar control and edema. Pt has been seen by therapies with CIR recommended. Pt is extremely debilitated, likely due to critical illness myopathy. Pt is to be admitted to CIR on 02/09/19.    Glasgow Coma Scale Score: 15  Past Medical  History  Past Medical History:  Diagnosis Date  . Anemia   . Bronchitis   . Cataracts, bilateral 01/12/2018  . CHF (congestive heart failure) ([place])   . Diabetes mellitus   . High cholesterol   . Hypertension    takes medicine to protect kidneys, does not have HTN  . Leukocytosis   . Morbid obesity ([place]) 09/11/2011   BMI 57  . Neuropathy 01/12/2018  . NICM (nonischemic cardiomyopathy) ([place]) 05/01/2013   Overview:  Last Assessment & Plan:  euvolemic on physical exam.   . Obesity   . Sickle cell trait ([place])   . Thymoma     Family History  family history includes Breast cancer in an other family member; Diabetes type II in an other family member; Heart disease in her father.  Prior Rehab/Hospitalizations:  Has the patient had prior rehab or hospitalizations prior to admission? No  Has the patient had major surgery during 100 days prior to admission? No  Current Medications   Current Facility-Administered Medications:  .  acetaminophen (TYLENOL) 160 MG/5ML solution 650 mg, 650 mg, Oral, Q6H PRN,  Dana Allan I, MD, 650 mg at 02/09/19 [id number] .  ascorbic acid (VITAMIN C) tablet 1,500 mg, 1,500 mg, Oral, Daily, Elie Confer, MD, 1,500 mg at 02/09/19 0847 .  aspirin chewable tablet 81 mg, 81 mg, Oral, Daily, Patrecia Pour, MD, 81 mg at 02/09/19 0847 .  carvedilol (COREG) tablet 12.5 mg, 12.5 mg, Oral, BID WC, Dana Allan I, MD, 12.5 mg at 02/09/19 0847 .  chlorhexidine (PERIDEX) 0.12 % solution 15 mL, 15 mL, Mouth/Throat, BID, Vance Gather B, MD, 15 mL at 02/09/19 0848 .  cholecalciferol (VITAMIN D) tablet 5,000 Units, 5,000 Units, Oral, Daily, Elie Confer, MD, 5,000 Units at 02/09/19 0846 .  dextrose 50 % solution 0-50 mL, 0-50 mL, Intravenous, PRN, Vance Gather B, MD .  enoxaparin (LOVENOX) injection 80 mg, 80 mg, Subcutaneous, Q24H, Dana Allan I, MD, 80 mg at 02/09/19 0848 .  feeding supplement (GLUCERNA SHAKE) (GLUCERNA SHAKE) liquid 237 mL, 237 mL, Oral, BID BM, Elie Confer, MD, 237 mL at 02/09/19 0850 .  ferrous sulfate tablet 325 mg, 325 mg, Oral, Q breakfast, Elie Confer, MD, 325 mg at 02/09/19 0848 .  insulin aspart (novoLOG) injection 0-20 Units, 0-20 Units, Subcutaneous, TID WC, Patrecia Pour, MD, 15 Units at 02/09/19 0810 .  insulin aspart (novoLOG) injection 15 Units, 15 Units, Subcutaneous, TID WC, Elie Confer, MD, 15 Units at 02/09/19 0810 .  insulin glargine (LANTUS) injection 25 Units, 25 Units, Subcutaneous, BID, Elie Confer, MD, 25 Units at 02/09/19 0849 .  levalbuterol (XOPENEX) nebulizer solution 0.63 mg, 0.63 mg, Nebulization, Q6H PRN, Dana Allan I, MD .  loratadine (CLARITIN) tablet 10 mg, 10 mg, Oral, Daily PRN, Bodenheimer, Charles A, NP, 10 mg at 02/08/19 0840 .  metFORMIN (GLUCOPHAGE) tablet 1,000 mg, 1,000 mg, Oral, BID WC, Elie Confer, MD, 1,000 mg at 02/09/19 0849 .  neomycin-bacitracin-polymyxin (NEOSPORIN) ointment, , Topical, Daily, Patrecia Pour, MD, Given at 02/09/19 [phone] .  [DISCONTINUED]  ondansetron (ZOFRAN) tablet 4 mg, 4 mg, Oral, Q6H PRN **OR** ondansetron (ZOFRAN) injection 4 mg, 4 mg, Intravenous, Q6H PRN, Patrecia Pour, MD, 4 mg at 01/28/19 0803 .  polyvinyl alcohol (LIQUIFILM TEARS) 1.4 % ophthalmic solution 1 drop, 1 drop, Both Eyes, QID PRN, Dana Allan I, MD, 1 drop at 02/08/19 2053 .  Resource ThickenUp Clear, , Oral, PRN, Patrecia Pour,  MD .  rosuvastatin (CRESTOR) tablet 40 mg, 40 mg, Oral, Daily, Patrecia Pour, MD, 40 mg at 02/09/19 0847 .  torsemide (DEMADEX) tablet 20 mg, 20 mg, Oral, BID, Dana Allan I, MD, 20 mg at 02/09/19 0850  Patients Current Diet:  Diet Order            Diet Carb Modified Fluid consistency: Thin; Room service appropriate? Yes  Diet effective now              Precautions / Restrictions Precautions Precautions: Fall Precaution Comments: morbid obesity, monitor HR Restrictions Weight Bearing Restrictions: No   Has the patient had 2 or more falls or a fall with injury in the past year?No  Prior Activity Level Community (5-7x/wk): not working but was looking after her grandkids daily (2yo and 71 yo). was driving and independent PTA. did avoid stairs due to bad knee  Prior Functional Level Prior Function Level of Independence: Independent Comments: does not work  Self Care: Did the patient need help bathing, dressing, using the toilet or eating?  Independent  Indoor Mobility: Did the patient need assistance with walking from room to room (with or without device)? Independent  Stairs: Did the patient need assistance with internal or external stairs (with or without device)? Needed some help  Functional Cognition: Did the patient need help planning regular tasks such as shopping or remembering to take medications? Independent  Home Assistive Devices / Equipment Home Assistive Devices/Equipment: None Home Equipment: None  Prior Device Use: Indicate devices/aids used by the patient prior to current illness,  exacerbation or injury? None of the above  Current Functional Level Cognition  Overall Cognitive Status: Impaired/Different from baseline Difficult to assess due to: Intubated Orientation Level: Oriented X4 Safety/Judgement: Decreased awareness of deficits General Comments: followed all one step commands consistently.  Needed multimodal cues at times for scapula movements    Extremity Assessment (includes Sensation/Coordination)  Upper Extremity Assessment: RUE deficits/detail, LUE deficits/detail RUE Deficits / Details: Demonstrating increased grasp strength. Continues to have weak grasp and shoulder ROM. Increased edema. More pain at RUE than left RUE Coordination: decreased fine motor, decreased gross motor LUE Deficits / Details: Active movement of hand and pt reachign out for OT's hand. Continues to present with weak grasp and limited shoulder ROM LUE Coordination: decreased fine motor, decreased gross motor  Lower Extremity Assessment: Defer to PT evaluation RLE Deficits / Details: bil LEs weak, but pt able to activate with assistance ankle, knee, and hip.  Heels look good without signs of pressure. LLE Deficits / Details: bil LEs weak, but pt able to activate with assistance ankle, knee, and hip.  Heels look good without signs of pressure.     ADLs  Overall ADL's : Needs assistance/impaired Eating/Feeding: Cueing for safety, Maximal assistance, With adaptive utensils Eating/Feeding Details (indicate cue type and reason): able to drink when lidded mug set up in front of her with 2 pillows under R elbow Grooming: Total assistance, Bed level, Wash/dry face Grooming Details (indicate cue type and reason): Total hand over hand to bring wash clothe to face and wipe eyes Toileting- Clothing Manipulation and Hygiene: Total assistance, +2 for physical assistance, Bed level Toileting - Clothing Manipulation Details (indicate cue type and reason): Total A for peri care at bed level with  flexiseal leaking slightly. Functional mobility during ADLs: Total assistance, +2 for physical assistance(+3 bed mobility only) General ADL Comments: pt so motivated.  Pt is very positive.    Mobility  Overal bed  mobility: Needs Assistance Bed Mobility: Supine to Sit Rolling: +2 for physical assistance, Total assist Supine to sit: Max assist, HOB elevated, +2 for physical assistance Sit to supine: +2 for physical assistance, Total assist General bed mobility comments: assist to raise trunk, pt advanced LEs independently, used rail to assist pulling up; pt sat EOB x 15 min holding on to bariRW for BUE support, mod A to maintain trunk support without BUE support, HR 128 max with sitting EOB    Transfers  Overall transfer level: Needs assistance Transfer via Lift Equipment: [place] transfer comment: Pt is up in chair this AM at OT arrival and required transfer back to bed via Max-move. Pt requires Total Assist for Bed to chair transfer with MaxiMove. Pt able to initiate raising hands to bring them together but required min assist to bring all the way to midline and was abel to lace fingers to keep hands in safe position on stomach.    Ambulation / Gait / Stairs / Office manager / Balance Dynamic Sitting Balance Sitting balance - Comments: sat EOB ~ 15 minutes, BUE supported on bariRW, mod to max A for trunk support without BUE support, trunk and cervical rotation AROM x 3 B, VCs to lift head and retract scapulae Balance Overall balance assessment: Needs assistance Sitting-balance support: Feet supported, Bilateral upper extremity supported Sitting balance-Leahy Scale: Zero Sitting balance - Comments: sat EOB ~ 15 minutes, BUE supported on bariRW, mod to max A for trunk support without BUE support, trunk and cervical rotation AROM x 3 B, VCs to lift head and retract scapulae Postural control: Posterior lean, Left lateral lean    Special needs/care consideration  BiPAP/CPAP: no CPM: no Continuous Drip IV: no Dialysis: no        Days: no Life Vest: no Oxygen: no, on RA Special Bed: no Trach Size: no Wound Vac (area): no      Location: no Skin: abrasion to face, nose, blister to face (bilaterally), skin breakdown to mid nose, non pressure wound to mid back                Bowel mgmt: incontinence, last BM: 02/08/19 Bladder mgmt: external catheter, incontinent  Diabetic mgmt: yes Behavioral consideration : no Chemo/radiation : no     Previous Home Environment (from acute therapy documentation) Living Arrangements: Children, Other relatives Available Help at Discharge: Available 24 hours/day Type of Home: Apartment Home Layout: One level Home Access: Level entry Bathroom Shower/Tub: Tub/shower unit, Architectural technologist: Standard Bathroom Accessibility: Yes(maybe) Home Care Services: No  Discharge Living Setting Plans for Discharge Living Setting: Other (Comment)(plan to stay with daughter [name] in level entry apartment) Type of Home at Discharge: Apartment Discharge Home Layout: One level Discharge Home Access: Level entry Discharge Bathroom Shower/Tub: Tub/shower unit Discharge Bathroom Toilet: Standard Discharge Bathroom Accessibility: Yes How Accessible: Accessible via wheelchair, Accessible via walker, Other (comment)(standard wc...unsure about bari wc) Does the patient have any problems obtaining your medications?: No  Social/Family/Support Systems Patient Roles: Caregiver, Other (Comment)(looked after grandchildren during the day) Contact Information: daughter: Ludwig Lean"): [phone]; other children Marval Regal, and Emily:[phone]) Anticipated Caregiver: Gillermina Hu Anticipated Caregiver's Contact Information: see above Ability/Limitations of Caregiver: Mod A, aware hoyer lift may be needed; wc level expected Caregiver Availability: 24/7 Discharge Plan Discussed with Primary Caregiver: Yes(discussed  with Aniceto Boss who has been in contact with siblings; also independently spoke to [place] -as she will be the main caregiver. She  understands the extensive assist that her mother may require at DC as well as possibility of hoyer lift) Is Caregiver In Agreement with Plan?: Yes Does Caregiver/Family have Issues with Lodging/Transportation while Pt is in Rehab?: No   Goals/Additional Needs Patient/Family Goal for Rehab: PT/OT: Mod A SLP: NA Expected length of stay: 24-28 days Cultural Considerations: NA Dietary Needs: carb modified, thin liquids; calorie level: medium 1600-2000 Equipment Needs: TBD Special Service Needs: room with maximove.  Pt/Family Agrees to Admission and willing to participate: Yes Program Orientation Provided & Reviewed with Pt/Caregiver Including Roles  & Responsibilities: Yes(pt and her daugthers Aniceto Boss and [name])  Barriers to Discharge: Lack of/limited family support, Insurance for SNF coverage, Weight, New oxygen   Decrease burden of Care through IP rehab admission: NA   Possible need for SNF placement upon discharge: Not anticipated; pt and family understand she is a difficult to place patient due to Medicaid status. They are aware to expect wc for mobility and possibly the need for a mechanical lift for transfers. They are prepared to do 24/7 physical assist.    Patient Condition: This patient's medical and functional status has changed since the consult dated: 01/30/19 in which the Rehabilitation Physician determined and documented that the patient's condition is appropriate for intensive rehabilitative care in an inpatient rehabilitation facility. See "History of Present Illness" (above) for medical update. Functional changes are: improvement in bed mobility from Total A +2 to Max A + 2, and improvement in ability to tolerate transfers out of bed. Pt currently total A with mechanical lift equipment and tolerating multiple hours OOB in recliner. Patient's medical and  functional status update has been discussed with the Rehabilitation physician and patient remains appropriate for inpatient rehabilitation. Will admit to inpatient rehab today.  Preadmission Screen Completed By:  Raechel Ache, OT, 02/09/2019 9:34 AM ______________________________________________________________________   Discussed status with Dr. Posey Pronto on 1/29 at 9:32AM and received approval for admission today.  Admission Coordinator:  Raechel Ache, time 9:32AM/Date 02/09/19

## 2019-02-07 NOTE — Progress Notes (Addendum)
Inpatient Rehabilitation-Admissions Coordinator   Spoke to pt's daughter Aniceto Boss. She has confirmed 24/7 A at DC. I did explain that the pt may need a hoyer lift at DC and will most likely need a wheelchair once home. Aniceto Boss is checking with her sister [name] to make sure she is comfortable with the potential use of a lift at DC as the patient will go to her home at DC.   Will follow up tomorrow once I have confirmation on dispo.   Raechel Ache, OTR/L  Rehab Admissions Coordinator  [phone] 02/07/2019 1:40 PM  Addendum 5:07PM: Met with pt and her daughter Aniceto Boss at the bedside to discuss DC support. Was able to speak with [name] via speaker phone, which is her other daughter who will assist at DC, to confirm her support and willingness for extensive assistance and possible need for lift equipment at DC. [name] confirmed support and pt would like to proceed with the intensive rehab program. [facility] will follow for bed availability.   Please call if questions.   Raechel Ache, OTR/L  Rehab Admissions Coordinator  [phone] 02/07/2019 5:09 PM

## 2019-02-07 NOTE — Progress Notes (Signed)
Occupational Therapy Treatment Patient Details Name: Kaitlin [name] MRN: [id number] DOB: 1965-09-19 Today's Date: 02/07/2019    History of present illness 54 y.o. female with medical history significant of anemia, bronchitis, bilateral cataracts, chronic diastolic CHF, type 2 diabetes, hyperlipidemia, hypertension, leukocytosis, morbid obesity, peripheral neuropathy, sickle cell trait, history of thymoma with history of thymoma resection . Intubated 1/4, support was weaned to pressure support 1/16, and subsequently the patient was extubated 1/20 and has since weaned off of supplemental oxygen. Patient transfered to [facility] on 02/03/19.   OT comments  Pt very motivated. Worked on positioning for pt to manage beverage herself, strengthening/stretching exercises. Gave pt built up foam to squeeze, especially with LUE, tip pinch  Follow Up Recommendations  CIR;Supervision/Assistance - 24 hour    Equipment Recommendations  (manual lift, wide 3:1, w/c)    Recommendations for Other Services      Precautions / Restrictions Precautions Precautions: Fall Precaution Comments: morbid obesity       Mobility Bed Mobility                  Transfers                      Balance                                           ADL either performed or assessed with clinical judgement   ADL     Eating/Feeding Details (indicate cue type and reason): able to drink when lidded mug set up in front of her with 2 pillows under R elbow                                         Vision       Perception     Praxis      Cognition Arousal/Alertness: Awake/alert Behavior During Therapy: [facility] for tasks assessed/performed                                   General Comments: followed all one step commands consistently.  Needed multimodal cues at times for scapula movements        Exercises Exercises: Other exercises General Exercises - Upper  Extremity Shoulder Flexion: PROM;Both;10 reps Shoulder ABduction: PROM;10 reps;Both Shoulder Horizontal ABduction: Both;AAROM;10 reps Elbow Flexion: (AROM R, AAROM L) Digit Composite Flexion: AROM;Both;5 reps;Supine Other Exercises Other Exercises: performed strengthening and stretching as above.  Pt has weaker L grip, especially first and second finger. Gave foam to squeeze Other Exercises: worked on relaxing scapula during ROM exercises; L with decreased strength. Encouraged gentle retraction.  Pt tends to recruit traps for all shoulder ROM Other Exercises: Unit trying to locate clip for remote.  If this is stabilized, pt can use independently   Shoulder Instructions       General Comments      Pertinent Vitals/ Pain       Pain Assessment: Faces Faces Pain Scale: Hurts little more Pain Location: shoulders with PROM Pain Descriptors / Indicators: Grimacing Pain Intervention(s): Limited activity within patient's tolerance;Monitored during session;Repositioned  Home Living  Prior Functioning/Environment              Frequency  Min 3X/week        Progress Toward Goals  OT Goals(current goals can now be found in the care plan section)  Progress towards OT goals: Progressing toward goals     Plan      Co-evaluation                 AM-PAC OT "6 Clicks" Daily Activity     Outcome Measure   Help from another person eating meals?: A Lot Help from another person taking care of personal grooming?: A Lot Help from another person toileting, which includes using toliet, bedpan, or urinal?: Total Help from another person bathing (including washing, rinsing, drying)?: Total Help from another person to put on and taking off regular upper body clothing?: Total Help from another person to put on and taking off regular lower body clothing?: Total 6 Click Score: 8    End of Session    OT Visit Diagnosis:  Other abnormalities of gait and mobility (R26.89);Muscle weakness (generalized) (M62.81)   Activity Tolerance Patient tolerated treatment well   Patient Left in bed;with nursing/sitter in room   Nurse Communication          Time: [id number] OT Time Calculation (min): 27 min  Charges: OT General Charges $OT Visit: 1 Visit OT Treatments $Self Care/Home Management : 8-22 mins $Therapeutic Exercise: 8-22 mins  Gabbs, OTR/L Acute Rehabilitation Services 02/07/2019   [name] 02/07/2019, 2:13 PM

## 2019-02-07 NOTE — Progress Notes (Addendum)
  Speech Language Pathology Treatment: Dysphagia  Patient Details Name: Kaitlin [name] MRN: [id number] DOB: [birth year] Today's Date: 02/07/2019 Time: 4742-5956 SLP Time Calculation (min) (ACUTE ONLY): 25 min  Assessment / Plan / Recommendation Clinical Impression  Today voice again subjectively stronger and pt advises strength today is a 6/10 - last session being 4/10 (10 normal).   Functional swallow noted without oral pocketing nor indication of aspiration- coughing.  Swallow is timely without increase in respiratory rate.  She used foam on her spoon with SLP encouragement to self feed with her right hand successfully! Intake has been listed as 75-100%!   Pt eats at slow rate and did not require cues for precautions today.  Has met all goals!   Recommend to continue regular/thin diet with precautions.  Would advise pt undergo higher level cognitive linguistic evaluation at next venue of care given her prolonged and complex hospital coarse.  Of note, pt is not drinking Ensure due to blood sugar concerns - SLP messaged dietician and advised RN to her concerns after reviewed nutrition label for Glucerna with pt. Pt wishes to have Glucerna (vanilla and/or strawberry) if needing a supplement.    HPI HPI: 54 y.o. female with medical history significant of anemia, bronchitis, bilateral cataracts, chronic diastolic CHF, type 2 diabetes, hyperlipidemia, hypertension, leukocytosis, morbid obesity, peripheral neuropathy, sickle cell trait, history of thymoma resection . Intubated 1/4-1/20.  She has [facility] to [facility] for continued medical treatment.  Swallow evaluation completed with pt starting dys2/honey - then dys2/nectar diet. Today follow up indicated to assess po tolerance and readiness for dietary advancement.  Pt subjectively gives voice strength a number 4/10 - 10 being normal.  She denies dysphagia.  Follow up today to assess po tolerance given advancement in diet to regular/thin.       SLP Plan  All goals met       Recommendations  Diet recommendations: Regular;Thin liquid Liquids provided via: Cup;Straw Medication Administration: (as tolerated) Supervision: Full supervision/cueing for compensatory strategies;Staff to assist with self feeding Compensations: Slow rate;Small sips/bites Postural Changes and/or Swallow Maneuvers: Seated upright 90 degrees;Upright 30-60 min after meal                Oral Care Recommendations: Oral care BID Follow up Recommendations: Inpatient Rehab SLP Visit Diagnosis: Dysphagia, unspecified (R13.10) Plan: All goals met       GO                Kaitlin [name] 02/07/2019, 6:21 PM  Kaitlin Lime, MS [place] Office [phone]

## 2019-02-08 LAB — COMPREHENSIVE METABOLIC PANEL
ALT: 31 U/L (ref 0–44)
AST: 20 U/L (ref 15–41)
Albumin: 3.2 g/dL — ABNORMAL LOW (ref 3.5–5.0)
Alkaline Phosphatase: 66 U/L (ref 38–126)
Anion gap: 13 (ref 5–15)
BUN: 18 mg/dL (ref 6–20)
CO2: 29 mmol/L (ref 22–32)
Calcium: 9.4 mg/dL (ref 8.9–10.3)
Chloride: 93 mmol/L — ABNORMAL LOW (ref 98–111)
Creatinine, Ser: 0.69 mg/dL (ref 0.44–1.00)
GFR calc Af Amer: >60 mL/min (ref >60)
GFR calc non Af Amer: >60 mL/min (ref >60)
Glucose, Bld: 270 mg/dL — ABNORMAL HIGH (ref 70–99)
Potassium: 2.9 mmol/L — ABNORMAL LOW (ref 3.5–5.1)
Sodium: 135 mmol/L (ref 135–145)
Total Bilirubin: 0.9 mg/dL (ref 0.3–1.2)
Total Protein: 6.6 g/dL (ref 6.5–8.1)

## 2019-02-08 LAB — CBC
HCT: 39.8 % (ref 36.0–46.0)
Hemoglobin: 13.3 g/dL (ref 12.0–15.0)
MCH: 27.7 pg (ref 26.0–34.0)
MCHC: 33.4 g/dL (ref 30.0–36.0)
MCV: 82.7 fL (ref 80.0–100.0)
Platelets: 448 10*3/uL — ABNORMAL HIGH (ref 150–400)
RBC: 4.81 MIL/uL (ref 3.87–5.11)
RDW: 20 % — ABNORMAL HIGH (ref 11.5–15.5)
WBC: 9.2 10*3/uL (ref 4.0–10.5)
nRBC: 0 % (ref 0.0–0.2)

## 2019-02-08 LAB — GLUCOSE, CAPILLARY
Glucose-Capillary: 321 mg/dL — ABNORMAL HIGH (ref 70–99)
Glucose-Capillary: 303 mg/dL — ABNORMAL HIGH (ref 70–99)
Glucose-Capillary: 262 mg/dL — ABNORMAL HIGH (ref 70–99)
Glucose-Capillary: 212 mg/dL — ABNORMAL HIGH (ref 70–99)
Glucose-Capillary: 230 mg/dL — ABNORMAL HIGH (ref 70–99)

## 2019-02-08 LAB — MAGNESIUM: Magnesium: 1.8 mg/dL (ref 1.7–2.4)

## 2019-02-08 LAB — PHOSPHORUS: Phosphorus: 3.4 mg/dL (ref 2.5–4.6)

## 2019-02-08 MED ORDER — POTASSIUM CHLORIDE CRYS ER 20 MEQ PO TBCR
40.0000 meq | EXTENDED_RELEASE_TABLET | ORAL | Status: AC
  Administered 2019-02-08 (×3): 40 meq via ORAL
  Filled 2019-02-08 (×3): qty 2

## 2019-02-08 MED ORDER — ALBUMIN HUMAN 25 % IV SOLN
25.0000 g | INTRAVENOUS | Status: AC
  Administered 2019-02-08 (×2): 25 g via INTRAVENOUS
  Filled 2019-02-08 (×2): qty 100

## 2019-02-08 MED ORDER — POTASSIUM CHLORIDE CRYS ER 20 MEQ PO TBCR
30.0000 meq | EXTENDED_RELEASE_TABLET | ORAL | Status: DC
  Filled 2019-02-08: qty 1

## 2019-02-08 MED ORDER — INSULIN ASPART 100 UNIT/ML ~~LOC~~ SOLN
15.0000 [IU] | Freq: Three times a day (TID) | SUBCUTANEOUS | Status: DC
  Administered 2019-02-08 – 2019-02-09 (×3): 15 [IU] via SUBCUTANEOUS

## 2019-02-08 MED ORDER — INSULIN GLARGINE 100 UNIT/ML ~~LOC~~ SOLN
25.0000 [IU] | Freq: Two times a day (BID) | SUBCUTANEOUS | Status: DC
  Administered 2019-02-08 – 2019-02-09 (×2): 25 [IU] via SUBCUTANEOUS
  Filled 2019-02-08 (×3): qty 0.25

## 2019-02-08 MED ORDER — GLUCERNA SHAKE PO LIQD
237.0000 mL | Freq: Two times a day (BID) | ORAL | Status: DC
  Administered 2019-02-08 – 2019-02-09 (×2): 237 mL via ORAL
  Filled 2019-02-08 (×4): qty 237

## 2019-02-08 NOTE — Progress Notes (Signed)
Physical Therapy Treatment Patient Details Name: Kaitlin [name] MRN: [id number] DOB: December 28, 1965 Today's Date: 02/08/2019    History of Present Illness 54 y.o. female with medical history significant of anemia, bronchitis, bilateral cataracts, chronic diastolic CHF, type 2 diabetes, hyperlipidemia, hypertension, leukocytosis, morbid obesity, peripheral neuropathy, sickle cell trait, history of thymoma with history of thymoma resection . Intubated 1/4, support was weaned to pressure support 1/16, and subsequently the patient was extubated 1/20 and has since weaned off of supplemental oxygen. Patient transfered to [facility] on 02/03/19.    PT Comments    Max assist of 2 for supine to sit. Pt sat at edge of bed x ~15 minutes with BUEs supported on bari RW, pt leans posteriorly and requires mod/max trunk support when BUEs aren't supported. Frequent verbal cues for posture. Performed trunk/cervical rotation AROM in sitting. Performed BLE strengthening exercises, B shoulder flexion PROM (pt not able to elevate shoulders actively, she stated she could elevate shoulders prior to covid, suspect rotator cuff injury from proning). HR 128 max with activity. Pt puts forth good effort. Maximove for transfer to recliner.    Follow Up Recommendations  CIR     Equipment Recommendations  Rolling walker with 5" wheels;3in1 (PT);Wheelchair (measurements PT);Wheelchair cushion (measurements PT);Hospital bed;Other (comment)(bariatric equipment. )    Recommendations for Other Services Rehab consult     Precautions / Restrictions Precautions Precautions: Fall Precaution Comments: morbid obesity, monitor HR Restrictions Weight Bearing Restrictions: No    Mobility  Bed Mobility Overal bed mobility: Needs Assistance Bed Mobility: Supine to Sit     Supine to sit: Max assist;HOB elevated;+2 for physical assistance     General bed mobility comments: assist to raise trunk, pt advanced LEs independently, used rail to  assist pulling up; pt sat EOB x 15 min holding on to bariRW for BUE support, mod A to maintain trunk support without BUE support, HR 128 max with sitting EOB  Transfers Overall transfer level: Needs assistance                  Ambulation/Gait                 Stairs             Wheelchair Mobility    Modified Rankin (Stroke Patients Only)       Balance Overall balance assessment: Needs assistance Sitting-balance support: Feet supported;Bilateral upper extremity supported Sitting balance-Leahy Scale: Zero Sitting balance - Comments: sat EOB ~ 15 minutes, BUE supported on bariRW, mod to max A for trunk support without BUE support, trunk and cervical rotation AROM x 3 B, VCs to lift head and retract scapulae Postural control: Posterior lean;Left lateral lean                                  Cognition Arousal/Alertness: Awake/alert Behavior During Therapy: WFL for tasks assessed/performed                                   General Comments: followed all one step commands consistently.  Needed multimodal cues at times for scapula movements      Exercises General Exercises - Upper Extremity Shoulder Flexion: PROM;10 reps;Both;Seated General Exercises - Lower Extremity Ankle Circles/Pumps: AROM;Both;10 reps;Supine Gluteal Sets: AROM;Both;5 reps;Seated Long Arc Quad: AROM;Right;5 reps;Seated Heel Slides: AAROM;Both;10 reps;Supine Hip ABduction/ADduction: AAROM;Both;10 reps;Supine Straight Leg Raises: AAROM;Both;5  reps;Supine    General Comments        Pertinent Vitals/Pain Pain Assessment: No/denies pain    Home Living                      Prior Function            PT Goals (current goals can now be found in the care plan section) Acute Rehab PT Goals Patient Stated Goal: to get home to her grandbabies PT Goal Formulation: With family Time For Goal Achievement: 02/13/19 Potential to Achieve Goals:  Good Progress towards PT goals: Progressing toward goals    Frequency    Min 3X/week      PT Plan Current plan remains appropriate    Co-evaluation PT/OT/SLP Co-Evaluation/Treatment: Yes            AM-PAC PT "6 Clicks" Mobility   Outcome Measure  Help needed turning from your back to your side while in a flat bed without using bedrails?: Total Help needed moving from lying on your back to sitting on the side of a flat bed without using bedrails?: Total Help needed moving to and from a bed to a chair (including a wheelchair)?: Total Help needed standing up from a chair using your arms (e.g., wheelchair or bedside chair)?: Total Help needed to walk in hospital room?: Total Help needed climbing 3-5 steps with a railing? : Total 6 Click Score: 6    End of Session Equipment Utilized During Treatment: (2L O2 [place]) Activity Tolerance: Patient limited by fatigue Patient left: with call bell/phone within reach;in chair;with chair alarm set Nurse Communication: Mobility status;Need for lift equipment PT Visit Diagnosis: Muscle weakness (generalized) (M62.81);Difficulty in walking, not elsewhere classified (R26.2)     Time: [id number] PT Time Calculation (min) (ACUTE ONLY): 38 min  Charges:  $Therapeutic Exercise: 8-22 mins $Therapeutic Activity: 23-37 mins                     Blondell Reveal Kistler PT 02/08/2019  Acute Rehabilitation Services Pager [phone] Office [phone]

## 2019-02-08 NOTE — TOC Progression Note (Signed)
Transition of Care [facility]) - Progression Note    Patient Details  Name: Kaitlin [name] MRN: [id number] Date of Birth: Mar 13, 1965  Transition of Care [facility]) CM/SW [place], [place] Phone Number: 02/08/2019, 11:01 AM  Clinical Narrative:   Patient has been accepted at [place].  We will be notified by Raechel Ache when bed becomes available, and patient will then transfer if medically stable.  TOC will continue to follow during the course of hospitalization.       Barriers to Discharge: Other (comment)(Waiting for bed to open up)  Expected Discharge Plan and Services                                                 Social Determinants of Health (SDOH) Interventions    Readmission Risk Interventions No flowsheet data found.

## 2019-02-08 NOTE — Progress Notes (Signed)
   02/08/19 0829  MEWS Score  Temp 98.5 F (36.9 C)  BP (!) 88/64  Pulse Rate (!) 108  Resp 18  Level of Consciousness Alert  SpO2 97 %  O2 Device Room Air  MEWS Score  MEWS Temp 0  MEWS Systolic 1  MEWS Pulse 1  MEWS RR 0  MEWS LOC 0  MEWS Score 2  MEWS Score Color Yellow  MEWS Assessment  Is this an acute change? Yes  MEWS guidelines implemented *See [place]  Provider Notification  Provider Name/Title Dr. Junious Dresser  Date Provider Notified 02/08/19  Time Provider Notified 0830  Notification Reason Change in status  Response See new orders  Date of Provider Response 02/08/19  Time of Provider Response 0831  Patient will be placed in yellow mews due to hr and bp.  Dr. Vonzella Nipple albumin to help support blood pressure.

## 2019-02-08 NOTE — Progress Notes (Signed)
PROGRESS NOTE  Kaitlin [name]  [id number] DOB: 1965-04-14 DOA: 01/12/2019 PCP: [place]  Brief Narrative: Patient is a 54 year old African-American female, morbidly obese, with past medical history significant for chronic HFpEF, T2DM, HTN, sickle cell trait and thymoma, recently resected.  Patient presented on 01/12/2019 to the ED with worsening dyspnea, cough, headache, and sore throat, found to be hypoxic with bilateral CXR infiltrates and positive SARS-CoV-2 antigen testing. Patient was admitted to [facility], treated with remdesivir and steroids, subsequently, tocilizumab 1/2 and convalescent plasma 1/4 when hypoxemia worsened from 8L > 15L requirement, hypercarbia and unresponsiveness developed, and the patient was intubated. Echocardiogram showed depressed LVEF to 45%-50% and diuresis was initiated. Antibiotics were added, then broadened due to shock requiring pressors and stress steroids. Central line was ultimately removed as well, though cultures remained negative and antibiotics were discontinued. Vent support was weaned to pressure support 1/16 and subsequently the patient was extubated 1/20 and has since weaned off of supplemental oxygen. PT/OT have recommended CIR rehabilitation, PM&R consulted. SLP has continued working with the patient and making dietary recommendations. Having completed 21 days of therapy, sustaining improvement, since positive covid testing, the patient's isolation was  discontinued. The patient was subsequently transferred to [facility].  Inpatient hospital course  02/03/2019: Patient seen.  Patient becomes dyspneic while speaking.  Nursing staff is worried that this may be anxiety.  We will get a chest x-ray and will check cardiac BNP.  We will also give patient 1 dose of IV Lasix.  Further management will depend on above.  02/04/2019: Patient seen.  Tachycardia reported.  ABG and EKG done, results reviewed.  EKG showed sinus tachycardia.  The revealed pH  of 7.47, PCO2 of 36, PO2 of 80.  Edema of the extremities improved significantly.  On further questioning, patient informed me that she was on diuretics daily prior to admission.  Will start patient on oral torsemide daily.  Continue to monitor renal function.  Critical illness myopathy persists.  02/05/2019: Patient seen.  Patient is tolerating oral diuretics.  Renal function and electrolytes within normal range.  We will continue to monitor closely.  We will also continue to monitor volume status closely.  Blood sugar is not optimally controlled.  Blood sugar today was 262.  Will discontinue subcutaneous Levemir 30 units once daily.  Will start patient on subcutaneous Lantus 20 units twice daily.  We will continue to monitor blood sugar closely.  Continue other diabetes management.  Eventual plan is for rehab when a bed is available.  02/06/2019.  Blood sugar still continues to run above 200.  Will titrate up mealtime insulin to 12 units 3 times daily plus sliding scale We will continue with dietary control as well Patient is sitting out of chair, alert and oriented and not in acute distress  02/07/2019: Blood sugar still running above 200.  Patient stable and not in acute distress. I will titrate mealtime insulin to 14 units 3 times daily plus sliding scale Continue to monitor blood sugar with fingersticks before meals and at bedtime lantus up to 22 units BID 02/08/2019: Blood sugar still running above 200.  Will adjust Lantus to 25 units twice daily on mealtime insulin to 15 units 3 times daily Consult has been placed for evaluation for inpatient rehab.   Assessment & Plan: Principal Problem:   Acute respiratory distress syndrome (ARDS) due to COVID-19 virus [facility]) Active Problems:   Hypertension   HLD (hyperlipidemia)   Insulin dependent type 2 diabetes mellitus (  [place])   Encounter for central line placement   Grade I diastolic dysfunction   Hypotension   Acute respiratory failure with  hypoxemia (HCC)   Shock circulatory (HCC)   Septic shock (HCC)   Hypernatremia  1.  Acute hypoxemic respiratory failure due to ARDS due to covid-19 pneumonia: On 3 L O2 by nasal cannula -SARS-CoV-2 Ag positive on 1/1.  - Wean supplemental oxygen as able. Was weaned to pressure support 1/16, extubated 1/20, on room air this morning. Afebrile. Goal SpO2 85-95% - continue incentive spirometry - Positive test was 21 days ago, pt has improved, last steroids > 1 week ago. Ok to DC isolation.  - Completed remdesivir x5 days, steroids x10 days, received tocilizumab 1/2, convalescent plasma 1/4. - Enoxaparin to return to standard weight-based prophylactic dose.  02/03/2019: Continue to monitor respiratory symptoms closely.  Patient is stable clinically and not in acute distress. Wean off oxygen 02/08/2019.  Patient is stable on 3 L O2 by nasal cannula.  We will continue to wean off O2.  Continue bronchodilators and incentive spirometry. .  2.  Dysphagia:   Continue with dysphagia 1 diet  3.  Acute HFrEF, suspected to be due to sepsis/covid-related cardiomyopathy, HTN:  Echocardiogram of 01/31/2019 showed ejection fraction of 45 to 50% (NYHA I-II, ACC/AHA B Continuing guideline directed medical therapy with aspirin, statin, beta-blocker and ACE/ARB's Continue with fluid restriction to 1500 cc/day Continue with torsemide  Low-sodium/2 g diet Daily weight - 4.  Shock, cardiogenic and septic:  Weaned from pressors.  Blood pressure appears upticking. Will restart antihypertensive medications.   Central line removed 1/14. Volume status is stable.  - Resolved.  5.  Poorly-controlled diabetes mellitus type 2 on insulin pump as outpatient:  Will continue with sliding scale insulin high-dose Lantus 25 U twice daily Mealtime insulin 15 units 3 times daily plus sliding scale Hypoglycemic protocol  6.  Debility .  Likely due to critical illness myopathy -Severe.  - PT/OT, CIR would be ideal setting  for rehabilitation.  Awaiting placement to subacute rehab  Morbid obesity: BMI 54.50 Dietary and lifestyle modification  Hypernatremia:   Resolved.    Hypokalemia:  Monitor potassium and replete as necessary.  Anemia of chronic disease  Monitor CBC and transfuse if hemoglobin is less than 7.0.   DVT prophylaxis: Lovenox 0.5mg /kg q24h Code Status: Full Family Communication: None at bedside . Disposition Plan: Consult has been placed for evaluation for inpatient rehab.  Caseworker on board. No barriers to discharge once there is bed availability at inpatient   Consultants:   PCCM  ID: discussed case with Dr. Graylon Good by phone 02/02/2019 for recommendations regarding isolation.  PM&R for inpatient rehab  Procedures:  ETT 1/4 >> 1/20 L IJ CVL 1/5 >> 1/14 Arterial R radial 1/6 >> 1/13  Antimicrobials/Covid therapies: Remdesivir 1/1 >> 1/6  Tocilizumab 1/2 x1  Decadron 1/1 >> to stress dose steroids 1/7 >> stopped 1/11  Vanco 1/4 >> 1/10  Cefepime 1/4 >> 1/7 Meropenem 1/7 >> 1/10  Cipro 1/7 >> 1/10  Subjective:  Objective: Vitals:   02/07/19 1639 02/07/19 2105 02/08/19 0624 02/08/19 0829  BP: 121/69 108/60 (!) 90/51 (!) 88/64  Pulse: (!) 101 (!) 101 100 (!) 108  Resp: 20 16 20 18   Temp: 97.7 F (36.5 C) 98.8 F (37.1 C) 99.3 F (37.4 C) 98.5 F (36.9 C)  TempSrc: Oral Oral Oral Oral  SpO2: 94% 92% 92% 97%  Weight:   (!) 167.4 kg   Height:  Intake/Output Summary (Last 24 hours) at 02/08/2019 0836 Last data filed at 02/08/2019 [id number] Gross per 24 hour  Intake 600 ml  Output 2325 ml  Net -1725 ml   Filed Weights   02/04/19 0605 02/05/19 0513 02/08/19 0624  Weight: (!) 166.1 kg (!) 165.6 kg (!) 167.4 kg    Gen: Morbidly obese. Myopathy (guess from illness).  Pulm: Decreased air entry  CV: S1S2 GI: Abdomen soft, non-tender, non-distended, with normoactive bowel sounds. No organomegaly or masses felt. Ext: Mild 1+ pitting edema .   Skin: Nasal  abrasion without erythema or exudate. No other rashes, lesions or ulcers on visualized skin Neuro: Awake and alert.  Decreased muscle strength bilateral lower extremities  Data Reviewed: I have personally reviewed following labs and imaging studies  CBC: Recent Labs  Lab 02/04/19 0531 02/05/19 0517 02/07/19 0428 02/08/19 0510  WBC 8.9 9.4 9.7 9.2  NEUTROABS 4.3 5.1  --   --   HGB 11.5* 11.8* 12.3 13.3  HCT 36.1 36.2 38.4 39.8  MCV 84.7 84.0 83.7 82.7  PLT 295 358 435* [id number]*   Basic Metabolic Panel: Recent Labs  Lab 02/02/19 0258 02/04/19 0531 02/05/19 0517 02/07/19 0428 02/08/19 0510  NA 148* 140 140 142 135  K 3.8 3.9 3.8 3.6 2.9*  CL 109 103 101 99 93*  CO2 29 27 28 30 29   GLUCOSE 70 215* 262* 199* 270*  BUN 14 14 15 17 18   CREATININE 0.59 0.66 0.71 0.86 0.69  CALCIUM 8.9 8.9 9.0 9.4 9.4  MG 2.4 2.2 2.0 1.8 1.8  PHOS  --  4.2 4.0 4.5 3.4   GFR: Estimated Creatinine Clearance: 140.1 mL/min (by C-G formula based on SCr of 0.69 mg/dL). Liver Function Tests: Recent Labs  Lab 02/04/19 0531 02/05/19 0517 02/07/19 0428 02/08/19 0510  AST  --   --  20 20  ALT  --   --  34 31  ALKPHOS  --   --  63 66  BILITOT  --   --  1.1 0.9  PROT  --   --  6.3* 6.6  ALBUMIN 2.8* 2.8* 3.1* 3.2*   No results for input(s): LIPASE, AMYLASE in the last 168 hours. No results for input(s): AMMONIA in the last 168 hours. Coagulation Profile: No results for input(s): INR, PROTIME in the last 168 hours. Cardiac Enzymes: No results for input(s): CKTOTAL, CKMB, CKMBINDEX, TROPONINI in the last 168 hours. BNP (last 3 results) No results for input(s): PROBNP in the last 8760 hours. HbA1C: No results for input(s): HGBA1C in the last 72 hours. CBG: Recent Labs  Lab 02/06/19 1705 02/06/19 2053 02/07/19 0736 02/07/19 1126 02/07/19 1645  GLUCAP 191* 131* 249* 269* 217*   Lipid Profile: No results for input(s): CHOL, HDL, LDLCALC, TRIG, CHOLHDL, LDLDIRECT in the last 72  hours. Thyroid Function Tests: No results for input(s): TSH, T4TOTAL, FREET4, T3FREE, THYROIDAB in the last 72 hours. Anemia Panel: No results for input(s): VITAMINB12, FOLATE, FERRITIN, TIBC, IRON, RETICCTPCT in the last 72 hours. Urine analysis:    Component Value Date/Time   COLORURINE YELLOW 01/31/2013 1138   APPEARANCEUR CLEAR 01/31/2013 1138   LABSPEC 1.029 01/31/2013 1138   PHURINE 6.0 01/31/2013 1138   GLUCOSEU NEG 01/31/2013 1138   HGBUR NEG 01/31/2013 1138   BILIRUBINUR NEG 01/31/2013 1138   KETONESUR NEG 01/31/2013 1138   PROTEINUR NEG 01/31/2013 1138   UROBILINOGEN 0.2 01/31/2013 1138   NITRITE NEG 01/31/2013 1138   LEUKOCYTESUR NEG 01/31/2013 1138  No results found for this or any previous visit (from the past 240 hour(s)).    Radiology Studies: No results found.  Scheduled Meds: . ascorbic acid  1,500 mg Oral Daily  . aspirin  81 mg Oral Daily  . carvedilol  12.5 mg Oral BID WC  . chlorhexidine  15 mL Mouth/Throat BID  . cholecalciferol  5,000 Units Oral Daily  . enoxaparin (LOVENOX) injection  80 mg Subcutaneous Q24H  . ferrous sulfate  325 mg Oral Q breakfast  . insulin aspart  0-20 Units Subcutaneous TID WC  . insulin aspart  14 Units Subcutaneous TID WC  . insulin glargine  22 Units Subcutaneous BID  . neomycin-bacitracin-polymyxin   Topical Daily  . potassium chloride  40 mEq Oral Q4H  . Ensure Max Protein  11 oz Oral Daily  . rosuvastatin  40 mg Oral Daily  . torsemide  20 mg Oral BID   Continuous Infusions: . albumin human       LOS: 27 days   Time spent: 35 minutes.  Elie Confer, MD [facility] [email or web address] 02/08/2019, 8:36 AM

## 2019-02-09 ENCOUNTER — Other Ambulatory Visit: Payer: Self-pay

## 2019-02-09 ENCOUNTER — Encounter (HOSPITAL_COMMUNITY): Payer: Self-pay | Admitting: Physical Medicine and Rehabilitation

## 2019-02-09 ENCOUNTER — Inpatient Hospital Stay (HOSPITAL_COMMUNITY)
Admission: RE | Admit: 2019-02-09 | Discharge: 2019-03-10 | DRG: 092 | Disposition: A | Discharge status: Home Health | Payer: Medicaid Other | Source: Intra-hospital | Attending: Physical Medicine and Rehabilitation | Admitting: Physical Medicine and Rehabilitation

## 2019-02-09 DIAGNOSIS — G7281 Critical illness myopathy: Secondary | ICD-10-CM | POA: Diagnosis present

## 2019-02-09 DIAGNOSIS — R5381 Other malaise: Secondary | ICD-10-CM

## 2019-02-09 DIAGNOSIS — I428 Other cardiomyopathies: Secondary | ICD-10-CM | POA: Diagnosis present

## 2019-02-09 DIAGNOSIS — Z6841 Body Mass Index (BMI) 40.0 and over, adult: Secondary | ICD-10-CM

## 2019-02-09 DIAGNOSIS — Z9641 Presence of insulin pump (external) (internal): Secondary | ICD-10-CM | POA: Diagnosis present

## 2019-02-09 DIAGNOSIS — E7800 Pure hypercholesterolemia, unspecified: Secondary | ICD-10-CM | POA: Diagnosis present

## 2019-02-09 DIAGNOSIS — E785 Hyperlipidemia, unspecified: Secondary | ICD-10-CM | POA: Diagnosis present

## 2019-02-09 DIAGNOSIS — U071 COVID-19: Secondary | ICD-10-CM | POA: Diagnosis present

## 2019-02-09 DIAGNOSIS — E669 Obesity, unspecified: Secondary | ICD-10-CM

## 2019-02-09 DIAGNOSIS — Z825 Family history of asthma and other chronic lower respiratory diseases: Secondary | ICD-10-CM

## 2019-02-09 DIAGNOSIS — L89109 Pressure ulcer of unspecified part of back, unspecified stage: Secondary | ICD-10-CM | POA: Diagnosis not present

## 2019-02-09 DIAGNOSIS — E1165 Type 2 diabetes mellitus with hyperglycemia: Secondary | ICD-10-CM

## 2019-02-09 DIAGNOSIS — Z7982 Long term (current) use of aspirin: Secondary | ICD-10-CM

## 2019-02-09 DIAGNOSIS — Z886 Allergy status to analgesic agent status: Secondary | ICD-10-CM

## 2019-02-09 DIAGNOSIS — D649 Anemia, unspecified: Secondary | ICD-10-CM | POA: Diagnosis present

## 2019-02-09 DIAGNOSIS — Z880 Allergy status to penicillin: Secondary | ICD-10-CM | POA: Diagnosis not present

## 2019-02-09 DIAGNOSIS — I110 Hypertensive heart disease with heart failure: Secondary | ICD-10-CM | POA: Diagnosis present

## 2019-02-09 DIAGNOSIS — K5900 Constipation, unspecified: Secondary | ICD-10-CM | POA: Diagnosis not present

## 2019-02-09 DIAGNOSIS — D573 Sickle-cell trait: Secondary | ICD-10-CM

## 2019-02-09 DIAGNOSIS — Z832 Family history of diseases of the blood and blood-forming organs and certain disorders involving the immune mechanism: Secondary | ICD-10-CM

## 2019-02-09 DIAGNOSIS — E876 Hypokalemia: Secondary | ICD-10-CM

## 2019-02-09 DIAGNOSIS — Z8249 Family history of ischemic heart disease and other diseases of the circulatory system: Secondary | ICD-10-CM | POA: Diagnosis not present

## 2019-02-09 DIAGNOSIS — E1142 Type 2 diabetes mellitus with diabetic polyneuropathy: Secondary | ICD-10-CM | POA: Diagnosis present

## 2019-02-09 DIAGNOSIS — B948 Sequelae of other specified infectious and parasitic diseases: Secondary | ICD-10-CM | POA: Diagnosis not present

## 2019-02-09 DIAGNOSIS — Z862 Personal history of diseases of the blood and blood-forming organs and certain disorders involving the immune mechanism: Secondary | ICD-10-CM

## 2019-02-09 DIAGNOSIS — R7309 Other abnormal glucose: Secondary | ICD-10-CM | POA: Diagnosis not present

## 2019-02-09 DIAGNOSIS — I5032 Chronic diastolic (congestive) heart failure: Secondary | ICD-10-CM | POA: Diagnosis present

## 2019-02-09 DIAGNOSIS — Z794 Long term (current) use of insulin: Secondary | ICD-10-CM

## 2019-02-09 DIAGNOSIS — I5033 Acute on chronic diastolic (congestive) heart failure: Secondary | ICD-10-CM

## 2019-02-09 DIAGNOSIS — M25512 Pain in left shoulder: Secondary | ICD-10-CM

## 2019-02-09 LAB — CBC
HCT: 35.8 % — ABNORMAL LOW (ref 36.0–46.0)
HCT: 38.9 % (ref 36.0–46.0)
Hemoglobin: 11.6 g/dL — ABNORMAL LOW (ref 12.0–15.0)
Hemoglobin: 13 g/dL (ref 12.0–15.0)
MCH: 26.9 pg (ref 26.0–34.0)
MCH: 27.1 pg (ref 26.0–34.0)
MCHC: 32.4 g/dL (ref 30.0–36.0)
MCHC: 33.4 g/dL (ref 30.0–36.0)
MCV: 82.9 fL (ref 80.0–100.0)
MCV: 81.2 fL (ref 80.0–100.0)
Platelets: 449 10*3/uL — ABNORMAL HIGH (ref 150–400)
Platelets: 539 10*3/uL — ABNORMAL HIGH (ref 150–400)
RBC: 4.32 MIL/uL (ref 3.87–5.11)
RBC: 4.79 MIL/uL (ref 3.87–5.11)
RDW: 19.9 % — ABNORMAL HIGH (ref 11.5–15.5)
RDW: 19.6 % — ABNORMAL HIGH (ref 11.5–15.5)
WBC: 7.9 10*3/uL (ref 4.0–10.5)
WBC: 9.3 10*3/uL (ref 4.0–10.5)
nRBC: 0 % (ref 0.0–0.2)
nRBC: 0 % (ref 0.0–0.2)

## 2019-02-09 LAB — COMPREHENSIVE METABOLIC PANEL
ALT: 25 U/L (ref 0–44)
AST: 19 U/L (ref 15–41)
Albumin: 3.7 g/dL (ref 3.5–5.0)
Alkaline Phosphatase: 61 U/L (ref 38–126)
Anion gap: 12 (ref 5–15)
BUN: 17 mg/dL (ref 6–20)
CO2: 30 mmol/L (ref 22–32)
Calcium: 9.5 mg/dL (ref 8.9–10.3)
Chloride: 96 mmol/L — ABNORMAL LOW (ref 98–111)
Creatinine, Ser: 0.68 mg/dL (ref 0.44–1.00)
GFR calc Af Amer: >60 mL/min (ref >60)
GFR calc non Af Amer: >60 mL/min (ref >60)
Glucose, Bld: 286 mg/dL — ABNORMAL HIGH (ref 70–99)
Potassium: 3.6 mmol/L (ref 3.5–5.1)
Sodium: 138 mmol/L (ref 135–145)
Total Bilirubin: 0.9 mg/dL (ref 0.3–1.2)
Total Protein: 6.8 g/dL (ref 6.5–8.1)

## 2019-02-09 LAB — GLUCOSE, CAPILLARY
Glucose-Capillary: 304 mg/dL — ABNORMAL HIGH (ref 70–99)
Glucose-Capillary: 249 mg/dL — ABNORMAL HIGH (ref 70–99)
Glucose-Capillary: 289 mg/dL — ABNORMAL HIGH (ref 70–99)
Glucose-Capillary: 166 mg/dL — ABNORMAL HIGH (ref 70–99)

## 2019-02-09 LAB — CREATININE, SERUM
Creatinine, Ser: 0.86 mg/dL (ref 0.44–1.00)
GFR calc Af Amer: >60 mL/min (ref >60)
GFR calc non Af Amer: >60 mL/min (ref >60)

## 2019-02-09 LAB — MAGNESIUM: Magnesium: 2.1 mg/dL (ref 1.7–2.4)

## 2019-02-09 LAB — PHOSPHORUS: Phosphorus: 2.9 mg/dL (ref 2.5–4.6)

## 2019-02-09 MED ORDER — ASPIRIN 81 MG PO CHEW
81.0000 mg | CHEWABLE_TABLET | Freq: Every day | ORAL | Status: DC
  Administered 2019-02-10 – 2019-03-10 (×29): 81 mg via ORAL
  Filled 2019-02-09 (×29): qty 1

## 2019-02-09 MED ORDER — ENOXAPARIN SODIUM 80 MG/0.8ML ~~LOC~~ SOLN: 80.0000 mg | SUBCUTANEOUS | Status: DC

## 2019-02-09 MED ORDER — GLUCERNA SHAKE PO LIQD
237.0000 mL | Freq: Two times a day (BID) | ORAL | Status: DC
  Administered 2019-02-10 – 2019-02-11 (×2): 237 mL via ORAL

## 2019-02-09 MED ORDER — INSULIN ASPART 100 UNIT/ML ~~LOC~~ SOLN
15.0000 [IU] | Freq: Three times a day (TID) | SUBCUTANEOUS | Status: DC
  Administered 2019-02-09 – 2019-02-14 (×15): 15 [IU] via SUBCUTANEOUS

## 2019-02-09 MED ORDER — METFORMIN HCL 500 MG PO TABS
1000.0000 mg | ORAL_TABLET | Freq: Two times a day (BID) | ORAL | Status: DC
  Administered 2019-02-09: 1000 mg via ORAL
  Filled 2019-02-09: qty 2

## 2019-02-09 MED ORDER — ALBUTEROL SULFATE (2.5 MG/3ML) 0.083% IN NEBU: 2.5000 mg | INHALATION_SOLUTION | RESPIRATORY_TRACT | Status: DC | PRN

## 2019-02-09 MED ORDER — TORSEMIDE 20 MG PO TABS
20.0000 mg | ORAL_TABLET | Freq: Two times a day (BID) | ORAL | Status: DC
  Administered 2019-02-09 – 2019-03-10 (×58): 20 mg via ORAL
  Filled 2019-02-09 (×62): qty 1

## 2019-02-09 MED ORDER — TRAZODONE HCL 50 MG PO TABS: 50.0000 mg | ORAL_TABLET | Freq: Every day | ORAL | Status: DC

## 2019-02-09 MED ORDER — POLYVINYL ALCOHOL 1.4 % OP SOLN
1.0000 [drp] | Freq: Four times a day (QID) | OPHTHALMIC | Status: DC | PRN
  Filled 2019-02-09: qty 15

## 2019-02-09 MED ORDER — VITAMIN D3 25 MCG (1000 UNIT) PO TABS
5000.0000 [IU] | ORAL_TABLET | Freq: Every day | ORAL | Status: DC
  Administered 2019-02-10 – 2019-03-10 (×29): 5000 [IU] via ORAL
  Filled 2019-02-09 (×55): qty 5

## 2019-02-09 MED ORDER — ASCORBIC ACID 500 MG PO TABS
1500.0000 mg | ORAL_TABLET | Freq: Every day | ORAL | Status: DC
  Administered 2019-02-10 – 2019-03-10 (×29): 1500 mg via ORAL
  Filled 2019-02-09 (×31): qty 3

## 2019-02-09 MED ORDER — ROSUVASTATIN CALCIUM 20 MG PO TABS
40.0000 mg | ORAL_TABLET | Freq: Every day | ORAL | Status: DC
  Administered 2019-02-09 – 2019-03-10 (×30): 40 mg via ORAL
  Filled 2019-02-09 (×29): qty 2

## 2019-02-09 MED ORDER — INSULIN ASPART 100 UNIT/ML ~~LOC~~ SOLN
0.0000 [IU] | Freq: Three times a day (TID) | SUBCUTANEOUS | Status: DC
  Administered 2019-02-09 – 2019-02-10 (×2): 11 [IU] via SUBCUTANEOUS
  Administered 2019-02-10 (×2): 7 [IU] via SUBCUTANEOUS
  Administered 2019-02-11: 15 [IU] via SUBCUTANEOUS
  Administered 2019-02-11: 4 [IU] via SUBCUTANEOUS
  Administered 2019-02-11 – 2019-02-12 (×2): 15 [IU] via SUBCUTANEOUS
  Administered 2019-02-12: 7 [IU] via SUBCUTANEOUS
  Administered 2019-02-12 – 2019-02-13 (×4): 11 [IU] via SUBCUTANEOUS
  Administered 2019-02-14: 12:00:00 7 [IU] via SUBCUTANEOUS
  Administered 2019-02-14: 11 [IU] via SUBCUTANEOUS
  Administered 2019-02-14: 07:00:00 7 [IU] via SUBCUTANEOUS
  Administered 2019-02-15: 4 [IU] via SUBCUTANEOUS
  Administered 2019-02-15 – 2019-02-16 (×3): 7 [IU] via SUBCUTANEOUS
  Administered 2019-02-16: 13:00:00 11 [IU] via SUBCUTANEOUS

## 2019-02-09 MED ORDER — ACETAMINOPHEN 160 MG/5ML PO SOLN
650.0000 mg | Freq: Four times a day (QID) | ORAL | Status: DC | PRN
  Administered 2019-02-10 – 2019-03-09 (×38): 650 mg via ORAL
  Filled 2019-02-09 (×38): qty 20.3

## 2019-02-09 MED ORDER — LORATADINE 10 MG PO TABS
10.0000 mg | ORAL_TABLET | Freq: Every day | ORAL | Status: DC | PRN
  Administered 2019-02-11 – 2019-03-09 (×22): 10 mg via ORAL
  Filled 2019-02-09 (×22): qty 1

## 2019-02-09 MED ORDER — BUSPIRONE HCL 5 MG PO TABS
7.5000 mg | ORAL_TABLET | Freq: Two times a day (BID) | ORAL | Status: DC
  Administered 2019-02-09: 7.5 mg via ORAL
  Filled 2019-02-09: qty 2

## 2019-02-09 MED ORDER — FERROUS SULFATE 325 (65 FE) MG PO TABS
325.0000 mg | ORAL_TABLET | Freq: Every day | ORAL | Status: DC
  Administered 2019-02-10 – 2019-03-10 (×29): 325 mg via ORAL
  Filled 2019-02-09 (×30): qty 1

## 2019-02-09 MED ORDER — INSULIN GLARGINE 100 UNIT/ML ~~LOC~~ SOLN
25.0000 [IU] | Freq: Two times a day (BID) | SUBCUTANEOUS | Status: DC
  Administered 2019-02-09 – 2019-02-10 (×2): 25 [IU] via SUBCUTANEOUS
  Filled 2019-02-09 (×3): qty 0.25

## 2019-02-09 MED ORDER — ENOXAPARIN SODIUM 80 MG/0.8ML ~~LOC~~ SOLN
80.0000 mg | SUBCUTANEOUS | Status: DC
  Filled 2019-02-09: qty 0.8

## 2019-02-09 MED ORDER — CARVEDILOL 12.5 MG PO TABS
12.5000 mg | ORAL_TABLET | Freq: Two times a day (BID) | ORAL | Status: DC
  Administered 2019-02-09 – 2019-02-15 (×12): 12.5 mg via ORAL
  Filled 2019-02-09 (×12): qty 1

## 2019-02-09 MED ORDER — SORBITOL 70 % SOLN
30.0000 mL | Freq: Every day | Status: DC | PRN
  Administered 2019-02-25: 30 mL via ORAL
  Filled 2019-02-09 (×2): qty 30

## 2019-02-09 NOTE — Discharge Summary (Signed)
Physician Discharge Summary  Patient ID: Kaitlin [name] MRN: [id number] DOB/AGE: Sep 09, 1965 54 y.o.  Admit date: 01/12/2019 Discharge date: 02/09/2019  Admission Diagnoses: Acute respiratory distress syndrome due to COVID-19 virus  Discharge Diagnoses:  Principal Problem:   Acute respiratory distress syndrome (ARDS) due to COVID-19 virus [facility]) Active Problems:   Hypertension   HLD (hyperlipidemia)   Insulin dependent type 2 diabetes mellitus ([place])   Encounter for central line placement   Grade I diastolic dysfunction   Hypotension   Acute respiratory failure with hypoxemia (HCC)   Shock circulatory (HCC)   Septic shock (HCC)   Hypernatremia   Discharged Condition: stable  Hospital Course:  Patient is a 54 year old African-American female, morbidly obese, with past medical history significant for chronic HFpEF, T2DM, HTN, sickle cell trait and thymoma, recently resected.  Patient presented on 01/12/2019 to the ED with worsening dyspnea, cough, headache, and sore throat, found to be hypoxic with bilateral CXR infiltrates and positive SARS-CoV-2 antigen testing. Patient was admitted to [facility], treated with remdesivir and steroids, subsequently, tocilizumab 1/2 and convalescent plasma 1/4 when hypoxemia worsened from 8L > 15L requirement, hypercarbia and unresponsiveness developed, and the patient was intubated. Echocardiogram showed depressed LVEF to 45%-50% and diuresis was initiated. Antibiotics were added, then broadened due to shock requiring pressors and stress steroids. Central line was ultimately removed as well, though cultures remained negative and antibiotics were discontinued. Vent support was weaned to pressure support 1/16 and subsequently the patient was extubated 1/20 and has since weaned off of supplemental oxygen. PT/OT have recommended CIR rehabilitation, PM&R consulted. SLP has continued working with the patient and making dietary recommendations. Having completed 21 days of  therapy, sustaining improvement, since positive covid testing, the patient's isolation was  discontinued.    Inpatient hospital course  02/03/2019: Patient seen.  Patient becomes dyspneic while speaking.  Nursing staff is worried that this may be anxiety.  We will get a chest x-ray and will check cardiac BNP.  We will also give patient 1 dose of IV Lasix.  Further management will depend on above.  02/04/2019: Patient seen.  Tachycardia reported.  ABG and EKG done, results reviewed.  EKG showed sinus tachycardia.  The revealed pH of 7.47, PCO2 of 36, PO2 of 80.  Edema of the extremities improved significantly.  On further questioning, patient informed me that she was on diuretics daily prior to admission.  Will start patient on oral torsemide daily.  Continue to monitor renal function.  Critical illness myopathy persists.  02/05/2019: Patient seen.  Patient is tolerating oral diuretics.  Renal function and electrolytes within normal range.  We will continue to monitor closely.  We will also continue to monitor volume status closely.  Blood sugar is not optimally controlled.  Blood sugar today was 262.  Will discontinue subcutaneous Levemir 30 units once daily.  Will start patient on subcutaneous Lantus 20 units twice daily.  We will continue to monitor blood sugar closely.  Continue other diabetes management.  Eventual plan is for rehab when a bed is available.  02/06/2019.  Blood sugar still continues to run above 200.  Will titrate up mealtime insulin to 12 units 3 times daily plus sliding scale We will continue with dietary control as well Patient is sitting out of chair, alert and oriented and not in acute distress  02/07/2019: Blood sugar still running above 200.  Patient stable and not in acute distress. I will titrate mealtime insulin to 14 units 3 times daily plus sliding  scale Continue to monitor blood sugar with fingersticks before meals and at bedtime lantus up to 22 units  BID  02/08/2019: Blood sugar still running above 200.  Will adjust Lantus to 25 units twice daily on mealtime insulin to 15 units 3 times daily Consult has been placed for evaluation for inpatient rehab.  02/09/2019: Blood sugar in the 200s.  Continue with Lantus 25 units twice daily, mealtime insulin 50 units 3 times daily. Started Metformin 1000 mg twice daily with meals today.  Last hemoglobin A1c on 01/13/2019 was 7.1. Started trazodone and buspirone for insomnia and anxiety.   Assessment & Plan: Principal Problem:   Acute respiratory distress syndrome (ARDS) due to COVID-19 virus [facility]) Active Problems:   Hypertension   HLD (hyperlipidemia)   Insulin dependent type 2 diabetes mellitus ([place])   Encounter for central line placement   Grade I diastolic dysfunction   Hypotension   Acute respiratory failure with hypoxemia (HCC)   Shock circulatory (HCC)   Septic shock (HCC)   Hypernatremia  1.  Acute hypoxemic respiratory failure due to ARDS due to covid-19 pneumonia: On 3 L O2 by nasal cannula -SARS-CoV-2 Ag positive on 1/1.  - Wean supplemental oxygen as able. Was weaned to pressure support 1/16, extubated 1/20, on room air this morning. Afebrile. Goal SpO2 85-95% - continue incentive spirometry - Positive test was 21 days ago, pt has improved, last steroids > 1 week ago. Ok to DC isolation.  - Completed remdesivir x5 days, steroids x10 days, received tocilizumab 1/2, convalescent plasma 1/4. - Enoxaparin at 0.5 mg/kg .  02/03/2019: Continue to monitor respiratory symptoms closely.  Patient is stable clinically and not in acute distress. Wean off oxygen 02/08/2019.  Patient is stable on 3 L O2 by nasal cannula.  We will continue to wean off O2.  Continue bronchodilators and incentive spirometry. 02/09/2019: Patient complains of insomnia and anxiety.  Last hemoglobin A1c on 01/13/2019 was 7.1. I will initiate trazodone 50 mg nightly and buspirone 7.5 mg twice daily .  2.  Dysphagia:    Continue with dysphagia 1 diet  3.  Acute HFrEF, suspected to be due to sepsis/covid-related cardiomyopathy, HTN:  Echocardiogram of 01/31/2019 showed ejection fraction of 45 to 50% (NYHA I-II, ACC/AHA B Continuing guideline directed medical therapy with aspirin, statin, beta-blocker and ACE/ARB's Continue with fluid restriction to 1500 cc/day Continue with torsemide  Low-sodium/2 g diet Daily weight - 4.  Shock, cardiogenic and septic:  Weaned from pressors.  Blood pressure appears upticking. Will restart antihypertensive medications.   Central line removed 1/14. Volume status is stable.  - Resolved.  5.  Poorly-controlled diabetes mellitus type 2 on insulin pump as outpatient:  Will continue with sliding scale insulin high-dose Lantus 25 U twice daily Mealtime insulin 15 units 3 times daily plus sliding scale Continue with metformin 1000 mg BID Hypoglycemic protocol  6.  Debility .  Likely due to critical illness myopathy Patient accepted for inpatient rehab  Morbid obesity: BMI 54.50 Dietary and lifestyle modification  Hypernatremia:   Resolved.    Hypokalemia:  Monitor potassium and replete as necessary.  Anemia of chronic disease  Monitor CBC and transfuse if hemoglobin is less than 7.0.   Consults:  PM&R PCCM ID - phone discussion  Significant Diagnostic Studies: BC, CMP, chest x-ray, echocardiogram.  Treatments: Completed antibiotics, remdesivir, Tocilizumab, dexamethasone oxygen supplementation  Discharge Exam: Blood pressure 108/65, pulse (!) 104, temperature 98.4 F (36.9 C), temperature source Oral, resp. rate 20, height 5\' 9"  (  1.753 m), weight (!) 167.4 kg, last menstrual period 06/17/2012, SpO2 (!) 89 %. General appearance: alert, cooperative, appears stated age and no distress Eyes: conjunctivae/corneas clear. PERRL, EOM's intact. Fundi benign. Nose: Nares normal. Septum midline. Mucosa normal. No drainage or sinus tenderness., Nasal  canula Resp: clear to auscultation bilaterally Chest wall: no tenderness Cardio: regular rate and rhythm, S1, S2 normal, no murmur, click, rub or gallop GI: Full abdomen Extremities: 1+ pitting edema Pulses: 2+ and symmetric Skin: Skin color, texture, turgor normal. No rashes or lesions Neurologic: Alert and oriented X 3, normal strength and tone. Normal symmetric reflexes. Normal coordination and gait  Disposition: Discharge disposition: 62-Rehab Facility       Discharge Instructions    Diet - low sodium heart healthy   Complete by: As directed    Increase activity slowly   Complete by: As directed       Total time spent on this discharge encounter is 40 minutes  Signed: Elie Confer 02/09/2019, 10:04 AM

## 2019-02-09 NOTE — Progress Notes (Signed)
Inpatient Diabetes Program Recommendations  AACE/ADA: New Consensus Statement on Inpatient Glycemic Control (2015)  Target Ranges:  Prepandial:   less than 140 mg/dL      Peak postprandial:   less than 180 mg/dL (1-2 hours)      Critically ill patients:  140 - 180 mg/dL   Lab Results  Component Value Date   GLUCAP 304 (H) 02/09/2019   HGBA1C 7.1 (H) 01/13/2019    Review of Glycemic Control  Results for REAGON, CARDINAS (MRN [id number]) as of 02/09/2019 08:48  Ref. Range 02/08/2019 08:10 02/08/2019 11:50 02/08/2019 16:57 02/08/2019 20:49 02/09/2019 07:45  Glucose-Capillary Latest Ref Range: 70 - 99 mg/dL 303 (H) 262 (H) 212 (H) 230 (H) 304 (H)   Diabetes history: DM 2 Outpatient Diabetes medications: insulin pump, Metformin 1000 gm bid, Semaglutide 0.25 mg weekly Current orders for Inpatient glycemic control:  Lantus 25 units bid Novolog 0-20 units tid  Novolog 15 units tid meal coverage  Inpatient Diabetes Program Recommendations:    Fasting glucose >300 the past 2 mornings. Consider increasing Lantus to 30 units bid. May need to decrease Novolog meal coverage slightly.  Thanks,  Tama Headings RN, MSN, BC-ADM Inpatient Diabetes Coordinator Team Pager [phone] (8a-5p)

## 2019-02-09 NOTE — Progress Notes (Signed)
Pt was admitted to IR unit 4MW-10 via [facility]. All questions were answered and pt was oriented to IR policies and procedures. Pt was resting comfortably in bed with bed alarm on and call bell within reach. Doy Hutching, LPN

## 2019-02-09 NOTE — Progress Notes (Signed)
Izora Ribas, MD  Physician  Physical Medicine and Rehabilitation  Consult Note  Signed  Date of Service:  01/30/2019  1:27 PM      Related encounter: ED to Hosp-Admission (Discharged) from 01/12/2019 in [facility]            Physical Medicine and Rehabilitation Consult Reason for Consult: Impaired mobility and ADLs Referring Physician: Dr. Kipp Brood     HPI: CARALINE ZONG is a 54 y.o. female with morbid obesity, nonischemic cardiomyopathy, DM2, HTN, CHF, sickle cell trait, who was admitted on 1/1 with [id number] pneumonia complicated by worsening hypoxemia requiring intubation on 1/4.Treated with remdesivir, steroids, and Actemra. Extubated and breathing well on Ages, upgraded to puree and honey thick today by SLP.   ROS +dysphagia, insomnia. -constipation, pain. ROS otherwise negative.      Past Medical History:  Diagnosis Date  . Anemia    . Bronchitis    . Cataracts, bilateral 01/12/2018  . CHF (congestive heart failure) ([place])    . Diabetes mellitus    . High cholesterol    . Hypertension      takes medicine to protect kidneys, does not have HTN  . Leukocytosis    . Morbid obesity ([place]) 09/11/2011    BMI 57  . Neuropathy 01/12/2018  . NICM (nonischemic cardiomyopathy) ([place]) 05/01/2013    Overview:  Last Assessment & Plan:  euvolemic on physical exam.   . Obesity    . Sickle cell trait ([place])    . Thymoma           Past Surgical History:  Procedure Laterality Date  . COLONOSCOPY N/A 06/21/2017    Procedure: COLONOSCOPY;  Surgeon: Ronnette Juniper, MD;  Location: [facility] ENDOSCOPY;  Service: Gastroenterology;  Laterality: N/A;  . LEFT HEART CATHETERIZATION WITH CORONARY ANGIOGRAM N/A 05/25/2013    Procedure: LEFT HEART CATHETERIZATION WITH CORONARY ANGIOGRAM;  Surgeon: Troy Sine, MD;  Location: [facility] CATH LAB;  Service: Cardiovascular;  Laterality: N/A;  . LYMPH NODE BIOPSY Right  11/30/2012    Procedure: RIGHT TONSIL BIOPSY WITH FRESH FROZEN ANALYSIS;  Surgeon: Jodi Marble, MD;  Location: [facility];  Service: ENT;  Laterality: Right;  . MEDIASTERNOTOMY N/A 01/12/2013    Procedure: MEDIAN STERNOTOMY;  Surgeon: Gaye Pollack, MD;  Location: [facility];  Service: Thoracic;  Laterality: N/A;  . MEDIASTINOTOMY CHAMBERLAIN MCNEIL    Right 11/06/2012    Procedure: [place];  Surgeon: Gaye Pollack, MD;  Location: [facility];  Service: Thoracic;  Laterality: Right;  . POLYPECTOMY   06/21/2017    Procedure: POLYPECTOMY;  Surgeon: Ronnette Juniper, MD;  Location: [facility];  Service: Gastroenterology;;  . RESECTION OF A THYMOMA N/A 01/12/2013    Procedure: RESECTION OF A THYMOMA;  Surgeon: Gaye Pollack, MD;  Location: [facility];  Service: Thoracic;  Laterality: N/A;  . TONSILLECTOMY      . TUBAL LIGATION             Family History  Problem Relation Age of Onset  . Heart disease Father          No details  . Diabetes type II Other          Family HX  . Breast cancer Other      Social History:  reports that she is a non-smoker but has been exposed to tobacco smoke.  She has never used smokeless tobacco. She reports that she does not drink alcohol or use drugs. Allergies:       Allergies  Allergen Reactions  . Ibuprofen Other (See Comments)      Per MD she should no longer take, unsure why  . Amoxicillin-Pot Clavulanate Diarrhea      Did it involve swelling of the face/tongue/throat, SOB, or low BP? No Did it involve sudden or severe rash/hives, skin peeling, or any reaction on the inside of your mouth or nose? No Did you need to seek medical attention at a hospital or doctor's office? No When did it last happen?    Unknown   If all above answers are "NO", may proceed with cephalosporin use.            Medications Prior to Admission  Medication Sig Dispense Refill  . acetaminophen (TYLENOL) 500 MG tablet Take 1,000 mg by mouth every 6 (six) hours as  needed for moderate pain or headache.      . albuterol (PROVENTIL HFA;VENTOLIN HFA) 108 (90 Base) MCG/ACT inhaler Inhale 1-2 puffs into the lungs every 6 (six) hours as needed for wheezing or shortness of breath. (Patient taking differently: Inhale 2 puffs into the lungs every 6 (six) hours as needed for wheezing or shortness of breath. ) 1 Inhaler 0  . ascorbic acid (VITAMIN C) 500 MG tablet Take 1,500 mg by mouth daily.      Marland Kitchen aspirin EC 81 MG tablet Take 1 tablet (81 mg total) by mouth daily. 90 tablet 3  . benzonatate (TESSALON) 200 MG capsule Take 1 capsule (200 mg total) by mouth 3 (three) times daily. (Patient taking differently: Take 200 mg by mouth 3 (three) times daily as needed for cough. ) 20 capsule 0  . Biotin w/ Vitamins C & E (HAIR SKIN & NAILS GUMMIES PO) Take 2 tablets by mouth daily.      . calcium-vitamin D (OSCAL WITH D) 500-200 MG-UNIT tablet Take 2 tablets by mouth daily with breakfast.      . carvedilol (COREG) 25 MG tablet TAKE 1 TABLET BY MOUTH TWICE DAILY WITH A MEAL (Patient taking differently: Take 25 mg by mouth 2 (two) times daily with a meal. ) 60 tablet 1  . Cholecalciferol (VITAMIN D-3) 5000 units TABS Take 5,000 Units by mouth daily.      Marland Kitchen ELDERBERRY PO Take 1 tablet by mouth 2 (two) times daily.      . ferrous sulfate 325 (65 FE) MG tablet Take 325 mg by mouth daily with breakfast.      . fexofenadine (ALLEGRA) 180 MG tablet Take 180 mg by mouth daily.      . Insulin Human (INSULIN PUMP) SOLN Inject into the skin. Novolog      . lidocaine (LIDODERM) 5 % Place 3 patches onto the skin daily. Remove & Discard patch within 12 hours or as directed by MD (Patient taking differently: Place 3 patches onto the skin daily as needed (back pain). Remove & Discard patch within 12 hours or as directed by MD) 90 patch 6  . lisinopril (PRINIVIL,ZESTRIL) 10 MG tablet Take 1 tablet (10 mg total) by mouth daily. PLEASE CONTACT OFFICE FOR ADDITIONAL REFILLS 180 tablet 0  . metFORMIN  (GLUCOPHAGE) 1000 MG tablet Take 1,000 mg by mouth 2 (two) times daily with a meal.      . Multiple Vitamin (MULTIVITAMIN WITH MINERALS) TABS tablet Take 1 tablet by mouth daily.      Marland Kitchen  Multiple Vitamins-Minerals (PRESERVISION AREDS) TABS Take 1 tablet by mouth 2 (two) times daily.      . rosuvastatin (CRESTOR) 40 MG tablet Take 40 mg by mouth daily.      . furosemide (LASIX) 40 MG tablet Take 1 tablet (40 mg total) by mouth daily. (Patient taking differently: Take 40 mg by mouth 2 (two) times daily as needed for fluid or edema. ) 120 tablet 3  . potassium chloride SA (K-DUR,KLOR-CON) 20 MEQ tablet Take 1 tablet (20 mEq total) by mouth daily. 90 tablet 3  . Semaglutide,0.25 or 0.5MG /DOS, (OZEMPIC, 0.25 OR 0.5 MG/DOSE,) 2 MG/1.5ML SOPN Inject 0.25 mg into the skin every 7 (seven) days.          Home: Home Living Family/patient expects to be discharged to:: Private residence Living Arrangements: Children, Other relatives Available Help at Discharge: Available 24 hours/day Type of Home: Apartment Home Access: Level entry Home Layout: One level Bathroom Shower/Tub: Tub/shower unit, Architectural technologist: Standard Bathroom Accessibility: Yes(maybe) Home Equipment: None  Functional History: Prior Function Level of Independence: Independent Comments: does not work Functional Status:  Mobility: Bed Mobility Overal bed mobility: Needs Assistance Bed Mobility: Rolling Rolling: Total assist, +2 for physical assistance Transfers General transfer comment: not attempted today   ADL:   Cognition: Cognition Overall Cognitive Status: Difficult to assess Orientation Level: Intubated/Tracheostomy - Unable to assess Cognition Arousal/Alertness: Lethargic, Suspect due to medications Behavior During Therapy: Flat affect Overall Cognitive Status: Difficult to assess General Comments: pt following commands appropriately. will further assess Difficult to assess due to: Intubated   Blood  pressure (!) 120/58, pulse 97, temperature 98.8 F (37.1 C), temperature source Oral, resp. rate (!) 33, height 5\' 9"  (1.753 m), weight (!) 165 kg, last menstrual period 06/17/2012, SpO2 97 %.    Physical Exam  Gen: no distress, normal appearing, morbidly obese HEENT: oral mucosa pink and moist, NCAT, hypophonic Cardio: Reg rate Chest: normal effort, normal rate of breathing Abd: soft, non-distended Ext: no edema Skin: intact Neuro: AOx3.  Musculoskeletal: 4-/5 strength in bilateral lower extremities and right hand. Unable to move left arm.  Psych: pleasant, normal affect   Lab Results Last 24 Hours       Results for orders placed or performed during the hospital encounter of 01/12/19 (from the past 24 hour(s))  Glucose, capillary     Status: Abnormal    Collection Time: 01/29/19  4:37 PM  Result Value Ref Range    Glucose-Capillary 185 (H) 70 - 99 mg/dL  Glucose, capillary     Status: Abnormal    Collection Time: 01/29/19 10:32 PM  Result Value Ref Range    Glucose-Capillary 246 (H) 70 - 99 mg/dL  Glucose, capillary     Status: Abnormal    Collection Time: 01/30/19 12:10 AM  Result Value Ref Range    Glucose-Capillary 212 (H) 70 - 99 mg/dL  Glucose, capillary     Status: Abnormal    Collection Time: 01/30/19  5:13 AM  Result Value Ref Range    Glucose-Capillary 145 (H) 70 - 99 mg/dL  Glucose, capillary     Status: Abnormal    Collection Time: 01/30/19  7:24 AM  Result Value Ref Range    Glucose-Capillary 155 (H) 70 - 99 mg/dL  Glucose, capillary     Status: Abnormal    Collection Time: 01/30/19 11:42 AM  Result Value Ref Range    Glucose-Capillary 163 (H) 70 - 99 mg/dL      Imaging Results (  Last 48 hours)  No results found.       Assessment/Plan: Diagnosis: Acute respiratory distress syndrome due to COVID-19 virus 1. Does the need for close, 24 hr/day medical supervision in concert with the patient's rehab needs make it unreasonable for this patient to be served in  a less intensive setting? Yes 2. Co-Morbidities requiring supervision/potential complications: HTN, HLD, Insulin dependent type 2 DM, grade 1 diastolic dysfunction, hypotension, circulatory shock, morbid obesity 3. Due to bladder management, bowel management, safety, skin/wound care, disease management, medication administration, pain management and patient education, does the patient require 24 hr/day rehab nursing? Yes 4. Does the patient require coordinated care of a physician, rehab nurse, therapy disciplines of PT, OT, SLP to address physical and functional deficits in the context of the above medical diagnosis(es)? Yes Addressing deficits in the following areas: balance, endurance, locomotion, strength, transferring, bowel/bladder control, bathing, dressing, feeding, grooming, toileting, cognition, speech, language, swallowing and psychosocial support 5. Can the patient actively participate in an intensive therapy program of at least 3 hrs of therapy per day at least 5 days per week? Yes 6. The potential for patient to make measurable gains while on inpatient rehab is good 7. Anticipated functional outcomes upon discharge from inpatient rehab are mod assist with PT and OT and MinA with SLP.  8. Estimated rehab length of stay to reach the above functional goals is: 2-3 weeks 9. Anticipated discharge destination: Home 10. Overall Rehab/Functional Prognosis: good   RECOMMENDATIONS: This patient's condition is appropriate for continued rehabilitative care in the following setting: CIR Patient has agreed to participate in recommended program. Yes Note that insurance prior authorization may be required for reimbursement for recommended care.   Comment: Mrs. Lucus would be an excellent rehabilitation candidate. Her last positive COVID test was 1/1 so she is currently 20 days out.  Disposition: Lives with her children who will be available 24/7. She was previously independent.  Insomnia: consider  melatonin 3mg  HS to promote night-time sleep.  Morbid obesity: would need bariatric equipment in rehab.    Izora Ribas, MD 01/30/2019        Routing History Date/Time From To Method  02/01/2019  3:16 PM Raulkar, Clide Deutscher, MD [facility] [place] At Fax

## 2019-02-09 NOTE — Progress Notes (Signed)
Inpatient Rehabilitation-Admissions Coordinator   Spoke with pt and her daughter Aniceto Boss via phone. Both still want to pursue CIR at this time. I have a bed available in [facility] for this patient today and have confirmed medical clearance for admit today from Dr. Junious Dresser. RN and [facility] team aware of plan. I will set up [facility] transport.   Please call if questions.   Raechel Ache, OTR/L  Rehab Admissions Coordinator  [phone] 02/09/2019 9:56 AM

## 2019-02-09 NOTE — Progress Notes (Signed)
Jamse Arn, MD  Physician  Physical Medicine and Rehabilitation  PMR Pre-admission  Signed  Date of Service:  02/07/2019 11:07 AM      Related encounter: ED to Hosp-Admission (Discharged) from 01/12/2019 in [place]      Signed        PMR Admission Coordinator Pre-Admission Assessment   Patient: Kaitlin [name] is an 54 y.o., female MRN: [id number] DOB: [birth year] Height: 5\' 9"  (175.3 cm) Weight: (!) 167.4 kg                                                                                                                                                  Insurance Information HMO:     PPO:      PCP:      IPA:      80/20:      OTHER:  PRIMARY: Medicaid [place]      Policy#: [id number]      Subscriber: patient Coverage Code: [place] Name:       Phone#:      Fax#:  Pre-Cert#:       Employer:  Benefits:  Phone #: verified eligibility via automated system: [phone]     Name: verified eligibiltiy online via [place] on 02/07/19 Eff. Date: verified eligibility on 02/07/19     Deduct:      Out of Pocket Max:       Life Max:  CIR: Covered per Medicaid guidelines      SNF:  Outpatient:      Co-Pay:  Home Health:       Co-Pay:  DME:      Co-Pay:  Providers:  SECONDARY: None      Policy#:       Subscriber:  CM Name:       Phone#:      Fax#:  Pre-Cert#:       Employer:  Benefits:  Phone #:      Name:  Eff. Date:      Deduct:       Out of Pocket Max:       Life Max: CIR:       SNF:  Outpatient:      Co-Pay:  Home Health:       Co-Pay:  DME:     Co-Pay:   Medicaid Application Date:       Case Manager: Disability Application Date:       Case Worker:    The "Data Collection Information Summary" for patients in Inpatient Rehabilitation Facilities with attached "Privacy Act [place] Records" was provided and verbally reviewed with: N/A   Emergency Contact Information         Contact Information     Name Relation Home Work  [place] Daughter [phone]   [phone]  Current Medical History  Patient Admitting Diagnosis: 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 Pt presented to the ED on 01/12/19 with worsening dyspnea, cough, headache, and sore throat, found to be hypoxic with bilateral infiltrates with + COVID test. Pt was admitted to [facility] and treated with remdesivir and steroids, tocilizumab, and convalescent plasma. The patient required intubation when her hypoxemia worsened from 8L to 15L and pt became unresponsive. Pt was diuresed, antibiotics added, pressors started and stress steroids. The patient was extubated on 1/20. As the patient had completed 21 days of therapy with improvement since last positive Covid test, the pt's isolation was discontinued and she was transferred to [facility] for further management. Since hospitalization, pt has been having issues with blood sugar control and edema. Pt has been seen by therapies with CIR recommended. Pt is extremely debilitated, likely due to critical illness myopathy. Pt is to be admitted to CIR on 02/09/19.  Glasgow Coma Scale Score: 15   Past Medical History      Past Medical History:  Diagnosis Date  . Anemia    . Bronchitis    . Cataracts, bilateral 01/12/2018  . CHF (congestive heart failure) ([place])    . Diabetes mellitus    . High cholesterol    . Hypertension      takes medicine to protect kidneys, does not have HTN  . Leukocytosis    . Morbid obesity ([place]) 09/11/2011    BMI 57  . Neuropathy 01/12/2018  . NICM (nonischemic cardiomyopathy) ([place]) 05/01/2013    Overview:  Last Assessment & Plan:  euvolemic on physical exam.   . Obesity    . Sickle cell trait ([place])    . Thymoma        Family History  family  history includes Breast cancer in an other family member; Diabetes type II in an other family member; Heart disease in her father.   Prior Rehab/Hospitalizations:  Has the patient had prior rehab or hospitalizations prior to admission? No   Has the patient had major surgery during 100 days prior to admission? No   Current Medications    Current Facility-Administered Medications:  .  acetaminophen (TYLENOL) 160 MG/5ML solution 650 mg, 650 mg, Oral, Q6H PRN, Dana Allan I, MD, 650 mg at 02/09/19 [id number] .  ascorbic acid (VITAMIN C) tablet 1,500 mg, 1,500 mg, Oral, Daily, Elie Confer, MD, 1,500 mg at 02/09/19 0847 .  aspirin chewable tablet 81 mg, 81 mg, Oral, Daily, Patrecia Pour, MD, 81 mg at 02/09/19 0847 .  carvedilol (COREG) tablet 12.5 mg, 12.5 mg, Oral, BID WC, Dana Allan I, MD, 12.5 mg at 02/09/19 0847 .  chlorhexidine (PERIDEX) 0.12 % solution 15 mL, 15 mL, Mouth/Throat, BID, Vance Gather B, MD, 15 mL at 02/09/19 0848 .  cholecalciferol (VITAMIN D) tablet 5,000 Units, 5,000 Units, Oral, Daily, Elie Confer, MD, 5,000 Units at 02/09/19 0846 .  dextrose 50 % solution 0-50 mL, 0-50 mL, Intravenous, PRN, Vance Gather B, MD .  enoxaparin (LOVENOX) injection 80 mg, 80 mg, Subcutaneous, Q24H, Dana Allan I, MD, 80 mg at 02/09/19 0848 .  feeding supplement (GLUCERNA SHAKE) (GLUCERNA SHAKE) liquid 237 mL, 237 mL, Oral, BID BM, Elie Confer, MD, 237 mL at 02/09/19 0850 .  ferrous sulfate tablet 325 mg,  325 mg, Oral, Q breakfast, Elie Confer, MD, 325 mg at 02/09/19 0848 .  insulin aspart (novoLOG) injection 0-20 Units, 0-20 Units, Subcutaneous, TID WC, Patrecia Pour, MD, 15 Units at 02/09/19 0810 .  insulin aspart (novoLOG) injection 15 Units, 15 Units, Subcutaneous, TID WC, Elie Confer, MD, 15 Units at 02/09/19 0810 .  insulin glargine (LANTUS) injection 25 Units, 25 Units, Subcutaneous, BID, Elie Confer, MD, 25 Units at 02/09/19 0849 .  levalbuterol  (XOPENEX) nebulizer solution 0.63 mg, 0.63 mg, Nebulization, Q6H PRN, Dana Allan I, MD .  loratadine (CLARITIN) tablet 10 mg, 10 mg, Oral, Daily PRN, Bodenheimer, Charles A, NP, 10 mg at 02/08/19 0840 .  metFORMIN (GLUCOPHAGE) tablet 1,000 mg, 1,000 mg, Oral, BID WC, Elie Confer, MD, 1,000 mg at 02/09/19 0849 .  neomycin-bacitracin-polymyxin (NEOSPORIN) ointment, , Topical, Daily, Patrecia Pour, MD, Given at 02/09/19 [phone] .  [DISCONTINUED] ondansetron (ZOFRAN) tablet 4 mg, 4 mg, Oral, Q6H PRN **OR** ondansetron (ZOFRAN) injection 4 mg, 4 mg, Intravenous, Q6H PRN, Patrecia Pour, MD, 4 mg at 01/28/19 0803 .  polyvinyl alcohol (LIQUIFILM TEARS) 1.4 % ophthalmic solution 1 drop, 1 drop, Both Eyes, QID PRN, Dana Allan I, MD, 1 drop at 02/08/19 2053 .  Resource ThickenUp Clear, , Oral, PRN, Patrecia Pour, MD .  rosuvastatin (CRESTOR) tablet 40 mg, 40 mg, Oral, Daily, Patrecia Pour, MD, 40 mg at 02/09/19 0847 .  torsemide (DEMADEX) tablet 20 mg, 20 mg, Oral, BID, Dana Allan I, MD, 20 mg at 02/09/19 0850   Patients Current Diet:     Diet Order                      Diet Carb Modified Fluid consistency: Thin; Room service appropriate? Yes  Diet effective now                   Precautions / Restrictions Precautions Precautions: Fall Precaution Comments: morbid obesity, monitor HR Restrictions Weight Bearing Restrictions: No    Has the patient had 2 or more falls or a fall with injury in the past year?No   Prior Activity Level Community (5-7x/wk): not working but was looking after her grandkids daily (2yo and 46 yo). was driving and independent PTA. did avoid stairs due to bad knee   Prior Functional Level Prior Function Level of Independence: Independent Comments: does not work   Self Care: Did the patient need help bathing, dressing, using the toilet or eating?  Independent   Indoor Mobility: Did the patient need assistance with walking from room to room  (with or without device)? Independent   Stairs: Did the patient need assistance with internal or external stairs (with or without device)? Needed some help   Functional Cognition: Did the patient need help planning regular tasks such as shopping or remembering to take medications? Independent   Home Assistive Devices / Equipment Home Assistive Devices/Equipment: None Home Equipment: None   Prior Device Use: Indicate devices/aids used by the patient prior to current illness, exacerbation or injury? None of the above   Current Functional Level Cognition   Overall Cognitive Status: Impaired/Different from baseline Difficult to assess due to: Intubated Orientation Level: Oriented X4 Safety/Judgement: Decreased awareness of deficits General Comments: followed all one step commands consistently.  Needed multimodal cues at times for scapula movements    Extremity Assessment (includes Sensation/Coordination)   Upper Extremity Assessment: RUE deficits/detail, LUE deficits/detail RUE Deficits / Details: Demonstrating increased grasp  strength. Continues to have weak grasp and shoulder ROM. Increased edema. More pain at RUE than left RUE Coordination: decreased fine motor, decreased gross motor LUE Deficits / Details: Active movement of hand and pt reachign out for OT's hand. Continues to present with weak grasp and limited shoulder ROM LUE Coordination: decreased fine motor, decreased gross motor  Lower Extremity Assessment: Defer to PT evaluation RLE Deficits / Details: bil LEs weak, but pt able to activate with assistance ankle, knee, and hip.  Heels look good without signs of pressure. LLE Deficits / Details: bil LEs weak, but pt able to activate with assistance ankle, knee, and hip.  Heels look good without signs of pressure.      ADLs   Overall ADL's : Needs assistance/impaired Eating/Feeding: Cueing for safety, Maximal assistance, With adaptive utensils Eating/Feeding Details (indicate  cue type and reason): able to drink when lidded mug set up in front of her with 2 pillows under R elbow Grooming: Total assistance, Bed level, Wash/dry face Grooming Details (indicate cue type and reason): Total hand over hand to bring wash clothe to face and wipe eyes Toileting- Clothing Manipulation and Hygiene: Total assistance, +2 for physical assistance, Bed level Toileting - Clothing Manipulation Details (indicate cue type and reason): Total A for peri care at bed level with flexiseal leaking slightly. Functional mobility during ADLs: Total assistance, +2 for physical assistance(+3 bed mobility only) General ADL Comments: pt so motivated.  Pt is very positive.     Mobility   Overal bed mobility: Needs Assistance Bed Mobility: Supine to Sit Rolling: +2 for physical assistance, Total assist Supine to sit: Max assist, HOB elevated, +2 for physical assistance Sit to supine: +2 for physical assistance, Total assist General bed mobility comments: assist to raise trunk, pt advanced LEs independently, used rail to assist pulling up; pt sat EOB x 15 min holding on to bariRW for BUE support, mod A to maintain trunk support without BUE support, HR 128 max with sitting EOB     Transfers   Overall transfer level: Needs assistance Transfer via Lift Equipment: [place] transfer comment: Pt is up in chair this AM at OT arrival and required transfer back to bed via Max-move. Pt requires Total Assist for Bed to chair transfer with MaxiMove. Pt able to initiate raising hands to bring them together but required min assist to bring all the way to midline and was abel to lace fingers to keep hands in safe position on stomach.     Ambulation / Gait / Stairs / Proofreader / Balance Dynamic Sitting Balance Sitting balance - Comments: sat EOB ~ 15 minutes, BUE supported on bariRW, mod to max A for trunk support without BUE support, trunk and cervical rotation AROM x 3 B, VCs to  lift head and retract scapulae Balance Overall balance assessment: Needs assistance Sitting-balance support: Feet supported, Bilateral upper extremity supported Sitting balance-Leahy Scale: Zero Sitting balance - Comments: sat EOB ~ 15 minutes, BUE supported on bariRW, mod to max A for trunk support without BUE support, trunk and cervical rotation AROM x 3 B, VCs to lift head and retract scapulae Postural control: Posterior lean, Left lateral lean     Special needs/care consideration BiPAP/CPAP: no CPM: no Continuous Drip IV: no Dialysis: no        Days: no Life Vest: no Oxygen: no, on RA Special Bed: no Trach Size: no Wound Vac (area): no  Location: no Skin: abrasion to face, nose, blister to face (bilaterally), skin breakdown to mid nose, non pressure wound to mid back                Bowel mgmt: incontinence, last BM: 02/08/19 Bladder mgmt: external catheter, incontinent  Diabetic mgmt: yes Behavioral consideration : no Chemo/radiation : no        Previous Home Environment (from acute therapy documentation) Living Arrangements: Children, Other relatives Available Help at Discharge: Available 24 hours/day Type of Home: Apartment Home Layout: One level Home Access: Level entry Bathroom Shower/Tub: Tub/shower unit, Architectural technologist: Energy manager) Home Care Services: No   Discharge Living Setting Plans for Discharge Living Setting: Other (Comment)(plan to stay with daughter [name] in level entry apartment) Type of Home at Discharge: Apartment Discharge Home Layout: One level Discharge Home Access: Level entry Discharge Bathroom Shower/Tub: Tub/shower unit Discharge Bathroom Toilet: Standard Discharge Bathroom Accessibility: Yes How Accessible: Accessible via wheelchair, Accessible via walker, Other (comment)(standard wc...unsure about bari wc) Does the patient have any problems obtaining your medications?: No   Social/Family/Support  Systems Patient Roles: Caregiver, Other (Comment)(looked after grandchildren during the day) Contact Information: daughter: Ludwig Lean"): [phone]; other children Marval Regal, and [phone]) Anticipated Caregiver: Gillermina Hu Anticipated Caregiver's Contact Information: see above Ability/Limitations of Caregiver: Mod A, aware hoyer lift may be needed; wc level expected Caregiver Availability: 24/7 Discharge Plan Discussed with Primary Caregiver: Yes(discussed with Aniceto Boss who has been in contact with siblings; also independently spoke to [place] -as she will be the main caregiver. She understands the extensive assist that her mother may require at DC as well as possibility of hoyer lift) Is Caregiver In Agreement with Plan?: Yes Does Caregiver/Family have Issues with Lodging/Transportation while Pt is in Rehab?: No     Goals/Additional Needs Patient/Family Goal for Rehab: PT/OT: Mod A SLP: NA Expected length of stay: 24-28 days Cultural Considerations: NA Dietary Needs: carb modified, thin liquids; calorie level: medium 1600-2000 Equipment Needs: TBD Special Service Needs: room with maximove.  Pt/Family Agrees to Admission and willing to participate: Yes Program Orientation Provided & Reviewed with Pt/Caregiver Including Roles  & Responsibilities: Yes(pt and her daugthers Aniceto Boss and [name])  Barriers to Discharge: Lack of/limited family support, Insurance for SNF coverage, Weight, New oxygen     Decrease burden of Care through IP rehab admission: NA     Possible need for SNF placement upon discharge: Not anticipated; pt and family understand she is a difficult to place patient due to Medicaid status. They are aware to expect wc for mobility and possibly the need for a mechanical lift for transfers. They are prepared to do 24/7 physical assist.      Patient Condition: This patient's medical and functional status has changed since the consult dated: 01/30/19 in which  the Rehabilitation Physician determined and documented that the patient's condition is appropriate for intensive rehabilitative care in an inpatient rehabilitation facility. See "History of Present Illness" (above) for medical update. Functional changes are: improvement in bed mobility from Total A +2 to Max A + 2, and improvement in ability to tolerate transfers out of bed. Pt currently total A with mechanical lift equipment and tolerating multiple hours OOB in recliner. Patient's medical and functional status update has been discussed with the Rehabilitation physician and patient remains appropriate for inpatient rehabilitation. Will admit to inpatient rehab today.   Preadmission Screen Completed By:  Raechel Ache, OT, 02/09/2019 9:34 AM ______________________________________________________________________   Discussed status with  Dr. Posey Pronto on 1/29 at 9:32AM and received approval for admission today.   Admission Coordinator:  Raechel Ache, time 9:32AM/Date 02/09/19         Revision History Date/Time User Provider Type Action  02/09/2019  9:44 AM Jamse Arn, MD Physician Sign  02/09/2019  9:34 AM Raechel Ache, OT Rehab Admission Coordinator Share   View Details Report

## 2019-02-09 NOTE — Progress Notes (Signed)
Received consult for IV start. Patient is not currently receiving any IV medications and per hospitalist note yesterday, patient is just waiting on bed at inpatient rehab. If PIV needed in the future, please re-consult IV team.

## 2019-02-09 NOTE — H&P (Signed)
Physical Medicine and Rehabilitation Admission H&P    Chief Complaint  Patient presents with  . Shortness of Breath  : HPI: Kaitlin [name] is a 54 year old right-handed female with history of anemia, bronchitis, bilateral cataracts, NICM,chronic diastolic congestive heart failure, type 2 diabetes mellitus, hyperlipidemia, hypertension, morbid obesity with BMI 54.50, sickle cell trait, history of thymoma with resection 2015.  History taken from chart review and patient. Patient lives with her family.  1 level apartment with level entry.  Independent prior to admission.  She is unemployed.  Patient does have family in the area to provide assistance at discharge.  She presented on 01/13/2019 with increasing SOB, cough, generalized weakness, and nausea. Patient had been exposed to her son who recently was positive for COVID-19.  In the ED WBC 8.7, hemoglobin 12.7, platelets 203,000, lactic acid normal, BUN 22, creatinine 1.17, D-dimer 1.54, troponin high-sensitivity 29, SARS coronavirus 01/12/2019 positive.  It was noted patient recently checked for coronavirus 11/19/2018 that was negative.  Chest x-ray bilateral diffuse patchy airspace opacities concerning for multifocal pneumonia.  Patient received remdesivir, dexamethasone as well as actemra.  Antibiotics were later broadened due to shock requiring pressors and stress steroids and has since been completed.  Received 1000 mL normal saline bolus for hypotension and her Coreg was initially held.  Echocardiogram with EF of [id number], grade 1 diastolic dysfunction without emboli. Bilateral lower extremity Dopplers negative for DVT.  Patient did require intubation and extubated on 01/31/2019 and weaned off supplemental oxygen.  Patient did complete 21 days of therapy for COVID-19 sustaining improvement isolation later discontinued and patient was transferred from [facility] to [facility] 02/03/2019.  Patient has been receiving wound care for  partial-thickness blistered areas.  Her diet has been advanced to a regular consistency.  Therapy evaluations completed and patient was admitted for a comprehensive rehab program. Please see preadmission assessment from earlier today as well.   Review of Systems  Constitutional: Positive for malaise/fatigue.       Poor appetite  HENT: Positive for sore throat. Negative for hearing loss.   Eyes: Negative for blurred vision and double vision.  Respiratory: Positive for cough, sputum production and shortness of breath.   Cardiovascular: Positive for leg swelling. Negative for chest pain and palpitations.  Gastrointestinal: Positive for nausea.  Genitourinary: Negative for dysuria, flank pain and hematuria.  Musculoskeletal: Positive for joint pain and myalgias.  Skin: Negative for rash.  Neurological: Positive for weakness and headaches. Negative for seizures.   Past Medical History:  Diagnosis Date  . Anemia   . Bronchitis   . Cataracts, bilateral 01/12/2018  . CHF (congestive heart failure) ([place])   . Diabetes mellitus   . High cholesterol   . Hypertension    takes medicine to protect kidneys, does not have HTN  . Leukocytosis   . Morbid obesity ([place]) 09/11/2011   BMI 57  . Neuropathy 01/12/2018  . NICM (nonischemic cardiomyopathy) ([place]) 05/01/2013   Overview:  Last Assessment & Plan:  euvolemic on physical exam.   . Obesity   . Sickle cell trait ([place])   . Thymoma    Past Surgical History:  Procedure Laterality Date  . COLONOSCOPY N/A 06/21/2017   Procedure: COLONOSCOPY;  Surgeon: Ronnette Juniper, MD;  Location: [facility] ENDOSCOPY;  Service: Gastroenterology;  Laterality: N/A;  . LEFT HEART CATHETERIZATION WITH CORONARY ANGIOGRAM N/A 05/25/2013   Procedure: LEFT HEART CATHETERIZATION WITH CORONARY ANGIOGRAM;  Surgeon: Troy Sine, MD;  Location: [facility] CATH  LAB;  Service: Cardiovascular;  Laterality: N/A;  . LYMPH NODE BIOPSY Right 11/30/2012   Procedure: RIGHT TONSIL BIOPSY WITH FRESH  FROZEN ANALYSIS;  Surgeon: Jodi Marble, MD;  Location: [facility];  Service: ENT;  Laterality: Right;  . MEDIASTERNOTOMY N/A 01/12/2013   Procedure: MEDIAN STERNOTOMY;  Surgeon: Gaye Pollack, MD;  Location: [facility];  Service: Thoracic;  Laterality: N/A;  . MEDIASTINOTOMY CHAMBERLAIN MCNEIL Right 11/06/2012   Procedure: MEDIASTINOTOMY CHAMBERLAIN MCNEILPROCEDURE;  Surgeon: Gaye Pollack, MD;  Location: [facility];  Service: Thoracic;  Laterality: Right;  . POLYPECTOMY  06/21/2017   Procedure: POLYPECTOMY;  Surgeon: Ronnette Juniper, MD;  Location: [facility];  Service: Gastroenterology;;  . RESECTION OF A THYMOMA N/A 01/12/2013   Procedure: RESECTION OF A THYMOMA;  Surgeon: Gaye Pollack, MD;  Location: [facility];  Service: Thoracic;  Laterality: N/A;  . TONSILLECTOMY    . TUBAL LIGATION     Family History  Problem Relation Age of Onset  . Heart disease Father        No details  . Diabetes type II Other        Family HX  . Breast cancer Other    Social History:  reports that she is a non-smoker but has been exposed to tobacco smoke. She has never used smokeless tobacco. She reports that she does not drink alcohol or use drugs. Allergies:  Allergies  Allergen Reactions  . Ibuprofen Other (See Comments)    Per MD she should no longer take, unsure why  . Amoxicillin-Pot Clavulanate Diarrhea    Did it involve swelling of the face/tongue/throat, SOB, or low BP? No Did it involve sudden or severe rash/hives, skin peeling, or any reaction on the inside of your mouth or nose? No Did you need to seek medical attention at a hospital or doctor's office? No When did it last happen?Unknown If all above answers are "NO", may proceed with cephalosporin use.    Medications Prior to Admission  Medication Sig Dispense Refill  . acetaminophen (TYLENOL) 500 MG tablet Take 1,000 mg by mouth every 6 (six) hours as needed for moderate pain or headache.    . albuterol (PROVENTIL HFA;VENTOLIN HFA) 108 (90 Base)  MCG/ACT inhaler Inhale 1-2 puffs into the lungs every 6 (six) hours as needed for wheezing or shortness of breath. (Patient taking differently: Inhale 2 puffs into the lungs every 6 (six) hours as needed for wheezing or shortness of breath. ) 1 Inhaler 0  . ascorbic acid (VITAMIN C) 500 MG tablet Take 1,500 mg by mouth daily.    Marland Kitchen aspirin EC 81 MG tablet Take 1 tablet (81 mg total) by mouth daily. 90 tablet 3  . benzonatate (TESSALON) 200 MG capsule Take 1 capsule (200 mg total) by mouth 3 (three) times daily. (Patient taking differently: Take 200 mg by mouth 3 (three) times daily as needed for cough. ) 20 capsule 0  . Biotin w/ Vitamins C & E (HAIR SKIN & NAILS GUMMIES PO) Take 2 tablets by mouth daily.    . calcium-vitamin D (OSCAL WITH D) 500-200 MG-UNIT tablet Take 2 tablets by mouth daily with breakfast.    . carvedilol (COREG) 25 MG tablet TAKE 1 TABLET BY MOUTH TWICE DAILY WITH A MEAL (Patient taking differently: Take 25 mg by mouth 2 (two) times daily with a meal. ) 60 tablet 1  . Cholecalciferol (VITAMIN D-3) 5000 units TABS Take 5,000 Units by mouth daily.    Marland Kitchen ELDERBERRY PO Take 1  tablet by mouth 2 (two) times daily.    . ferrous sulfate 325 (65 FE) MG tablet Take 325 mg by mouth daily with breakfast.    . fexofenadine (ALLEGRA) 180 MG tablet Take 180 mg by mouth daily.    . Insulin Human (INSULIN PUMP) SOLN Inject into the skin. Novolog    . lidocaine (LIDODERM) 5 % Place 3 patches onto the skin daily. Remove & Discard patch within 12 hours or as directed by MD (Patient taking differently: Place 3 patches onto the skin daily as needed (back pain). Remove & Discard patch within 12 hours or as directed by MD) 90 patch 6  . lisinopril (PRINIVIL,ZESTRIL) 10 MG tablet Take 1 tablet (10 mg total) by mouth daily. PLEASE CONTACT OFFICE FOR ADDITIONAL REFILLS 180 tablet 0  . metFORMIN (GLUCOPHAGE) 1000 MG tablet Take 1,000 mg by mouth 2 (two) times daily with a meal.    . Multiple Vitamin  (MULTIVITAMIN WITH MINERALS) TABS tablet Take 1 tablet by mouth daily.    . Multiple Vitamins-Minerals (PRESERVISION AREDS) TABS Take 1 tablet by mouth 2 (two) times daily.    . rosuvastatin (CRESTOR) 40 MG tablet Take 40 mg by mouth daily.    . furosemide (LASIX) 40 MG tablet Take 1 tablet (40 mg total) by mouth daily. (Patient taking differently: Take 40 mg by mouth 2 (two) times daily as needed for fluid or edema. ) 120 tablet 3  . potassium chloride SA (K-DUR,KLOR-CON) 20 MEQ tablet Take 1 tablet (20 mEq total) by mouth daily. 90 tablet 3  . Semaglutide,0.25 or 0.5MG /DOS, (OZEMPIC, 0.25 OR 0.5 MG/DOSE,) 2 MG/1.5ML SOPN Inject 0.25 mg into the skin every 7 (seven) days.      Drug Regimen Review Drug regimen was reviewed and remains appropriate with no significant issues identified  Home: Home Living Family/patient expects to be discharged to:: Private residence Living Arrangements: Children, Other relatives Available Help at Discharge: Available 24 hours/day Type of Home: Apartment Home Access: Level entry Home Layout: One level Bathroom Shower/Tub: Tub/shower unit, Architectural technologist: Energy manager) Home Equipment: None   Functional History: Prior Function Level of Independence: Independent Comments: does not work  Functional Status:  Mobility: Bed Mobility Overal bed mobility: Needs Assistance Bed Mobility: Supine to Sit Rolling: +2 for physical assistance, Total assist Supine to sit: Max assist, HOB elevated, +2 for physical assistance Sit to supine: +2 for physical assistance, Total assist General bed mobility comments: assist to raise trunk, pt advanced LEs independently, used rail to assist pulling up; pt sat EOB x 15 min holding on to bariRW for BUE support, mod A to maintain trunk support without BUE support, HR 128 max with sitting EOB Transfers Overall transfer level: Needs assistance Transfer via Lift Equipment: [place]  transfer comment: Pt is up in chair this AM at OT arrival and required transfer back to bed via Max-move. Pt requires Total Assist for Bed to chair transfer with MaxiMove. Pt able to initiate raising hands to bring them together but required min assist to bring all the way to midline and was abel to lace fingers to keep hands in safe position on stomach.      ADL: ADL Overall ADL's : Needs assistance/impaired Eating/Feeding: Cueing for safety, Maximal assistance, With adaptive utensils Eating/Feeding Details (indicate cue type and reason): able to drink when lidded mug set up in front of her with 2 pillows under R elbow Grooming: Total assistance, Bed level, Wash/dry face Grooming Details (indicate cue  type and reason): Total hand over hand to bring wash clothe to face and wipe eyes Toileting- Clothing Manipulation and Hygiene: Total assistance, +2 for physical assistance, Bed level Toileting - Clothing Manipulation Details (indicate cue type and reason): Total A for peri care at bed level with flexiseal leaking slightly. Functional mobility during ADLs: Total assistance, +2 for physical assistance(+3 bed mobility only) General ADL Comments: pt so motivated.  Pt is very positive.  Cognition: Cognition Overall Cognitive Status: Impaired/Different from baseline Orientation Level: Oriented X4 Cognition Arousal/Alertness: Awake/alert Behavior During Therapy: WFL for tasks assessed/performed Overall Cognitive Status: Impaired/Different from baseline Area of Impairment: Safety/judgement Safety/Judgement: Decreased awareness of deficits Problem Solving: Slow processing General Comments: followed all one step commands consistently.  Needed multimodal cues at times for scapula movements Difficult to assess due to: Intubated  Physical Exam: Blood pressure 108/65, pulse (!) 104, temperature 98.4 F (36.9 C), temperature source Oral, resp. rate 20, height 5\' 9"  (1.753 m), weight (!) 167.4 kg,  last menstrual period 06/17/2012, SpO2 (!) 89 %. Physical Exam  Vitals reviewed. Constitutional: She appears well-developed.  Super obesity  HENT:  Head: Normocephalic.  Facial abrasisions  Eyes: EOM are normal. Right eye exhibits no discharge. Left eye exhibits no discharge.  Neck: No tracheal deviation present. No thyromegaly present.  Respiratory: Effort normal. No respiratory distress.  GI: Soft. She exhibits no distension.  Musculoskeletal:     Comments: LE edema  Neurological: She is alert.  Patient is awake and alert.   Makes good eye contact with examiner.   Follows commands.   Provides her name and age.   Motor: RUE: shoulder abduction 2+/5, elbow flexion/extension 3-/5, hand grip 4-/5 LUE: shoulder abduction 2/5, elbow flexion/extension 2+/5, hand grip 3/5 B/l LE: HF, KE 3+/5, ADF 4-/5 Sensation intact to light touch  Skin: Skin is warm and dry.  Sacral ulcer not examined See above  Psychiatric: She has a normal mood and affect. Her behavior is normal.    Results for orders placed or performed during the hospital encounter of 01/12/19 (from the past 48 hour(s))  Glucose, capillary     Status: Abnormal   Collection Time: 02/07/19  7:36 AM  Result Value Ref Range   Glucose-Capillary 249 (H) 70 - 99 mg/dL   Comment 1 Notify RN    Comment 2 Document in Chart   Glucose, capillary     Status: Abnormal   Collection Time: 02/07/19 11:26 AM  Result Value Ref Range   Glucose-Capillary 269 (H) 70 - 99 mg/dL   Comment 1 Notify RN    Comment 2 Document in Chart   Glucose, capillary     Status: Abnormal   Collection Time: 02/07/19  4:45 PM  Result Value Ref Range   Glucose-Capillary 217 (H) 70 - 99 mg/dL  Glucose, capillary     Status: Abnormal   Collection Time: 02/07/19  9:08 PM  Result Value Ref Range   Glucose-Capillary 321 (H) 70 - 99 mg/dL  CBC     Status: Abnormal   Collection Time: 02/08/19  5:10 AM  Result Value Ref Range   WBC 9.2 4.0 - 10.5 K/uL   RBC 4.81  3.87 - 5.11 MIL/uL   Hemoglobin 13.3 12.0 - 15.0 g/dL   HCT 39.8 36.0 - 46.0 %   MCV 82.7 80.0 - 100.0 fL   MCH 27.7 26.0 - 34.0 pg   MCHC 33.4 30.0 - 36.0 g/dL   RDW 20.0 (H) 11.5 - 15.5 %   Platelets  448 (H) 150 - 400 K/uL   nRBC 0.0 0.0 - 0.2 %    Comment: Performed at [facility], [place] [address], [place], [place] [address]  Comprehensive metabolic panel     Status: Abnormal   Collection Time: 02/08/19  5:10 AM  Result Value Ref Range   Sodium 135 135 - 145 mmol/L    Comment: DELTA CHECK NOTED   Potassium 2.9 (L) 3.5 - 5.1 mmol/L    Comment: DELTA CHECK NOTED   Chloride 93 (L) 98 - 111 mmol/L   CO2 29 22 - 32 mmol/L   Glucose, Bld 270 (H) 70 - 99 mg/dL   BUN 18 6 - 20 mg/dL   Creatinine, Ser 0.69 0.44 - 1.00 mg/dL   Calcium 9.4 8.9 - 10.3 mg/dL   Total Protein 6.6 6.5 - 8.1 g/dL   Albumin 3.2 (L) 3.5 - 5.0 g/dL   AST 20 15 - 41 U/L   ALT 31 0 - 44 U/L   Alkaline Phosphatase 66 38 - 126 U/L   Total Bilirubin 0.9 0.3 - 1.2 mg/dL   GFR calc non Af Amer >60 >60 mL/min   GFR calc Af Amer >60 >60 mL/min   Anion gap 13 5 - 15    Comment: Performed at [facility], [place] [address], [place], [place] [address]  Magnesium     Status: None   Collection Time: 02/08/19  5:10 AM  Result Value Ref Range   Magnesium 1.8 1.7 - 2.4 mg/dL    Comment: Performed at [facility], [place] [address] [place], [place] [address]  Phosphorus     Status: None   Collection Time: 02/08/19  5:10 AM  Result Value Ref Range   Phosphorus 3.4 2.5 - 4.6 mg/dL    Comment: Performed at [facility], [place] [address] [place], [place] [address]  Glucose, capillary     Status: Abnormal   Collection Time: 02/08/19  8:10 AM  Result Value Ref Range   Glucose-Capillary 303 (H) 70 - 99 mg/dL   Comment 1 Notify RN    Comment 2 Document in Chart   Glucose, capillary     Status: Abnormal   Collection Time: 02/08/19 11:50 AM  Result Value Ref  Range   Glucose-Capillary 262 (H) 70 - 99 mg/dL   Comment 1 Notify RN    Comment 2 Document in Chart   Glucose, capillary     Status: Abnormal   Collection Time: 02/08/19  4:57 PM  Result Value Ref Range   Glucose-Capillary 212 (H) 70 - 99 mg/dL   Comment 1 Notify RN    Comment 2 Document in Chart   Glucose, capillary     Status: Abnormal   Collection Time: 02/08/19  8:49 PM  Result Value Ref Range   Glucose-Capillary 230 (H) 70 - 99 mg/dL  CBC     Status: Abnormal   Collection Time: 02/09/19  4:53 AM  Result Value Ref Range   WBC 7.9 4.0 - 10.5 K/uL   RBC 4.32 3.87 - 5.11 MIL/uL   Hemoglobin 11.6 (L) 12.0 - 15.0 g/dL   HCT 35.8 (L) 36.0 - 46.0 %   MCV 82.9 80.0 - 100.0 fL   MCH 26.9 26.0 - 34.0 pg   MCHC 32.4 30.0 - 36.0 g/dL   RDW 19.9 (H) 11.5 - 15.5 %   Platelets 449 (H) 150 - 400 K/uL   nRBC 0.0 0.0 - 0.2 %  Comment: Performed at [facility], [place] [address] [place], [place] [address]   No results found.     Medical Problem List and Plan: 1.  Decreased functional mobility secondary to CIM due to acute hypoxemic respiratory failure due to ARDS/COVID-19 pneumonia.  Extubated 01/31/2019  -patient may shower  -ELOS/Goals: 27-32 days/Mod A  Admit to CIR. 2.  Antithrombotics: -DVT/anticoagulation: Lovenox.  Venous Doppler studies negative  -antiplatelet therapy: Aspirin 81 mg daily 3. Pain Management: Tylenol as needed 4. Mood: Provide emotional support  -antipsychotic agents: N/A 5. Neuropsych: This patient is capable of making decisions on her own behalf. 6. Skin/Wound Care: Routine skin checks 7. Fluids/Electrolytes/Nutrition: Routine in and outs.  CMP ordered. 8.  Acute on chronic diastolic congestive heart failure.  Echocardiogram ejection fraction 50%.  Monitor for any signs of fluid overload.  Continue Demadex 20 mg twice daily  Daily weights. 9.  Diabetes mellitus with peripheral neuropathy.  Hemoglobin A1c 7.1.  NovoLog 15 units 3 times  daily with meals, Lantus insulin 25 units twice daily.  Check blood sugars before meals and at bedtime.  Diabetic teaching  Monitor with increased mobility 10.  Cardiogenic shock.  Wean from pressors.  Monitor for orthostasis.  Continue Coreg 12.5 mg twice daily 11.  Super obesity.  BMI 54.50.  Dietary follow-up.  Encourage weight loss 12.  Hyperlipidemia.  Crestor 13.  History of anemia.  Continue iron supplement.  Latest hemoglobin 11.6.  CBC ordered. 14.  Sickle cell trait.  Follow-up outpatient.  Lavon Paganini Angiulli, PA-C 02/09/2019  I have personally performed a face to face diagnostic evaluation, including, but not limited to relevant history and physical exam findings, of this patient and developed relevant assessment and plan.  Additionally, I have reviewed and concur with the physician assistant's documentation above.  Delice Lesch, MD, ABPMR

## 2019-02-09 NOTE — H&P (Signed)
Physical Medicine and Rehabilitation Admission H&P    Chief Complaint  Patient presents with  . Shortness of Breath  : HPI: Kaitlin [name] is a 54 year old right-handed female with history of anemia, bronchitis, bilateral cataracts, NICM,chronic diastolic congestive heart failure, type 2 diabetes mellitus, hyperlipidemia, hypertension, morbid obesity with BMI 54.50, sickle cell trait, history of thymoma with resection 2015.  History taken from chart review and patient. Patient lives with her family.  1 level apartment with level entry.  Independent prior to admission.  She is unemployed.  Patient does have family in the area to provide assistance at discharge.  She presented on 01/13/2019 with increasing SOB, cough, generalized weakness, and nausea. Patient had been exposed to her son who recently was positive for COVID-19.  In the ED WBC 8.7, hemoglobin 12.7, platelets 203,000, lactic acid normal, BUN 22, creatinine 1.17, D-dimer 1.54, troponin high-sensitivity 29, SARS coronavirus 01/12/2019 positive.  It was noted patient recently checked for coronavirus 11/19/2018 that was negative.  Chest x-ray bilateral diffuse patchy airspace opacities concerning for multifocal pneumonia.  Patient received remdesivir, dexamethasone as well as actemra.  Antibiotics were later broadened due to shock requiring pressors and stress steroids and has since been completed.  Received 1000 mL normal saline bolus for hypotension and her Coreg was initially held.  Echocardiogram with EF of [id number], grade 1 diastolic dysfunction without emboli. Bilateral lower extremity Dopplers negative for DVT.  Patient did require intubation and extubated on 01/31/2019 and weaned off supplemental oxygen.  Patient did complete 21 days of therapy for COVID-19 sustaining improvement isolation later discontinued and patient was transferred from [place] to [facility] 02/03/2019.  Patient has been receiving wound care for  partial-thickness blistered areas.  Her diet has been advanced to a regular consistency.  Therapy evaluations completed and patient was admitted for a comprehensive rehab program. Please see preadmission assessment from earlier today as well.   Review of Systems  Constitutional: Positive for malaise/fatigue.       Poor appetite  HENT: Positive for sore throat. Negative for hearing loss.   Eyes: Negative for blurred vision and double vision.  Respiratory: Positive for cough, sputum production and shortness of breath.   Cardiovascular: Positive for leg swelling. Negative for chest pain and palpitations.  Gastrointestinal: Positive for nausea.  Genitourinary: Negative for dysuria, flank pain and hematuria.  Musculoskeletal: Positive for joint pain and myalgias.  Skin: Negative for rash.  Neurological: Positive for weakness and headaches. Negative for seizures.   Past Medical History:  Diagnosis Date  . Anemia   . Bronchitis   . Cataracts, bilateral 01/12/2018  . CHF (congestive heart failure) ([place])   . Diabetes mellitus   . High cholesterol   . Hypertension    takes medicine to protect kidneys, does not have HTN  . Leukocytosis   . Morbid obesity ([place]) 09/11/2011   BMI 57  . Neuropathy 01/12/2018  . NICM (nonischemic cardiomyopathy) ([place]) 05/01/2013   Overview:  Last Assessment & Plan:  euvolemic on physical exam.   . Obesity   . Sickle cell trait ([place])   . Thymoma    Past Surgical History:  Procedure Laterality Date  . COLONOSCOPY N/A 06/21/2017   Procedure: COLONOSCOPY;  Surgeon: Ronnette Juniper, MD;  Location: [facility] ENDOSCOPY;  Service: Gastroenterology;  Laterality: N/A;  . LEFT HEART CATHETERIZATION WITH CORONARY ANGIOGRAM N/A 05/25/2013   Procedure: LEFT HEART CATHETERIZATION WITH CORONARY ANGIOGRAM;  Surgeon: Troy Sine, MD;  Location: [facility] CATH  LAB;  Service: Cardiovascular;  Laterality: N/A;  . LYMPH NODE BIOPSY Right 11/30/2012   Procedure: RIGHT TONSIL BIOPSY WITH FRESH  FROZEN ANALYSIS;  Surgeon: Jodi Marble, MD;  Location: [facility];  Service: ENT;  Laterality: Right;  . MEDIASTERNOTOMY N/A 01/12/2013   Procedure: MEDIAN STERNOTOMY;  Surgeon: Gaye Pollack, MD;  Location: [facility];  Service: Thoracic;  Laterality: N/A;  . MEDIASTINOTOMY CHAMBERLAIN MCNEIL Right 11/06/2012   Procedure: MEDIASTINOTOMY CHAMBERLAIN MCNEILPROCEDURE;  Surgeon: Gaye Pollack, MD;  Location: [facility];  Service: Thoracic;  Laterality: Right;  . POLYPECTOMY  06/21/2017   Procedure: POLYPECTOMY;  Surgeon: Ronnette Juniper, MD;  Location: [facility];  Service: Gastroenterology;;  . RESECTION OF A THYMOMA N/A 01/12/2013   Procedure: RESECTION OF A THYMOMA;  Surgeon: Gaye Pollack, MD;  Location: [facility];  Service: Thoracic;  Laterality: N/A;  . TONSILLECTOMY    . TUBAL LIGATION     Family History  Problem Relation Age of Onset  . Heart disease Father        No details  . Diabetes type II Other        Family HX  . Breast cancer Other    Social History:  reports that she is a non-smoker but has been exposed to tobacco smoke. She has never used smokeless tobacco. She reports that she does not drink alcohol or use drugs. Allergies:  Allergies  Allergen Reactions  . Ibuprofen Other (See Comments)    Per MD she should no longer take, unsure why  . Amoxicillin-Pot Clavulanate Diarrhea    Did it involve swelling of the face/tongue/throat, SOB, or low BP? No Did it involve sudden or severe rash/hives, skin peeling, or any reaction on the inside of your mouth or nose? No Did you need to seek medical attention at a hospital or doctor's office? No When did it last happen?Unknown If all above answers are "NO", may proceed with cephalosporin use.    Medications Prior to Admission  Medication Sig Dispense Refill  . acetaminophen (TYLENOL) 500 MG tablet Take 1,000 mg by mouth every 6 (six) hours as needed for moderate pain or headache.    . albuterol (PROVENTIL HFA;VENTOLIN HFA) 108 (90 Base)  MCG/ACT inhaler Inhale 1-2 puffs into the lungs every 6 (six) hours as needed for wheezing or shortness of breath. (Patient taking differently: Inhale 2 puffs into the lungs every 6 (six) hours as needed for wheezing or shortness of breath. ) 1 Inhaler 0  . ascorbic acid (VITAMIN C) 500 MG tablet Take 1,500 mg by mouth daily.    Marland Kitchen aspirin EC 81 MG tablet Take 1 tablet (81 mg total) by mouth daily. 90 tablet 3  . benzonatate (TESSALON) 200 MG capsule Take 1 capsule (200 mg total) by mouth 3 (three) times daily. (Patient taking differently: Take 200 mg by mouth 3 (three) times daily as needed for cough. ) 20 capsule 0  . Biotin w/ Vitamins C & E (HAIR SKIN & NAILS GUMMIES PO) Take 2 tablets by mouth daily.    . calcium-vitamin D (OSCAL WITH D) 500-200 MG-UNIT tablet Take 2 tablets by mouth daily with breakfast.    . carvedilol (COREG) 25 MG tablet TAKE 1 TABLET BY MOUTH TWICE DAILY WITH A MEAL (Patient taking differently: Take 25 mg by mouth 2 (two) times daily with a meal. ) 60 tablet 1  . Cholecalciferol (VITAMIN D-3) 5000 units TABS Take 5,000 Units by mouth daily.    Marland Kitchen ELDERBERRY PO Take 1  tablet by mouth 2 (two) times daily.    . ferrous sulfate 325 (65 FE) MG tablet Take 325 mg by mouth daily with breakfast.    . fexofenadine (ALLEGRA) 180 MG tablet Take 180 mg by mouth daily.    . Insulin Human (INSULIN PUMP) SOLN Inject into the skin. Novolog    . lidocaine (LIDODERM) 5 % Place 3 patches onto the skin daily. Remove & Discard patch within 12 hours or as directed by MD (Patient taking differently: Place 3 patches onto the skin daily as needed (back pain). Remove & Discard patch within 12 hours or as directed by MD) 90 patch 6  . lisinopril (PRINIVIL,ZESTRIL) 10 MG tablet Take 1 tablet (10 mg total) by mouth daily. PLEASE CONTACT OFFICE FOR ADDITIONAL REFILLS 180 tablet 0  . metFORMIN (GLUCOPHAGE) 1000 MG tablet Take 1,000 mg by mouth 2 (two) times daily with a meal.    . Multiple Vitamin  (MULTIVITAMIN WITH MINERALS) TABS tablet Take 1 tablet by mouth daily.    . Multiple Vitamins-Minerals (PRESERVISION AREDS) TABS Take 1 tablet by mouth 2 (two) times daily.    . rosuvastatin (CRESTOR) 40 MG tablet Take 40 mg by mouth daily.    . furosemide (LASIX) 40 MG tablet Take 1 tablet (40 mg total) by mouth daily. (Patient taking differently: Take 40 mg by mouth 2 (two) times daily as needed for fluid or edema. ) 120 tablet 3  . potassium chloride SA (K-DUR,KLOR-CON) 20 MEQ tablet Take 1 tablet (20 mEq total) by mouth daily. 90 tablet 3  . Semaglutide,0.25 or 0.5MG /DOS, (OZEMPIC, 0.25 OR 0.5 MG/DOSE,) 2 MG/1.5ML SOPN Inject 0.25 mg into the skin every 7 (seven) days.      Drug Regimen Review Drug regimen was reviewed and remains appropriate with no significant issues identified  Home: Home Living Family/patient expects to be discharged to:: Private residence Living Arrangements: Children, Other relatives Available Help at Discharge: Available 24 hours/day Type of Home: Apartment Home Access: Level entry Home Layout: One level Bathroom Shower/Tub: Tub/shower unit, Architectural technologist: Energy manager) Home Equipment: None   Functional History: Prior Function Level of Independence: Independent Comments: does not work  Functional Status:  Mobility: Bed Mobility Overal bed mobility: Needs Assistance Bed Mobility: Supine to Sit Rolling: +2 for physical assistance, Total assist Supine to sit: Max assist, HOB elevated, +2 for physical assistance Sit to supine: +2 for physical assistance, Total assist General bed mobility comments: assist to raise trunk, pt advanced LEs independently, used rail to assist pulling up; pt sat EOB x 15 min holding on to bariRW for BUE support, mod A to maintain trunk support without BUE support, HR 128 max with sitting EOB Transfers Overall transfer level: Needs assistance Transfer via Lift Equipment: [place]  transfer comment: Pt is up in chair this AM at OT arrival and required transfer back to bed via Max-move. Pt requires Total Assist for Bed to chair transfer with MaxiMove. Pt able to initiate raising hands to bring them together but required min assist to bring all the way to midline and was abel to lace fingers to keep hands in safe position on stomach.      ADL: ADL Overall ADL's : Needs assistance/impaired Eating/Feeding: Cueing for safety, Maximal assistance, With adaptive utensils Eating/Feeding Details (indicate cue type and reason): able to drink when lidded mug set up in front of her with 2 pillows under R elbow Grooming: Total assistance, Bed level, Wash/dry face Grooming Details (indicate cue  type and reason): Total hand over hand to bring wash clothe to face and wipe eyes Toileting- Clothing Manipulation and Hygiene: Total assistance, +2 for physical assistance, Bed level Toileting - Clothing Manipulation Details (indicate cue type and reason): Total A for peri care at bed level with flexiseal leaking slightly. Functional mobility during ADLs: Total assistance, +2 for physical assistance(+3 bed mobility only) General ADL Comments: pt so motivated.  Pt is very positive.  Cognition: Cognition Overall Cognitive Status: Impaired/Different from baseline Orientation Level: Oriented X4 Cognition Arousal/Alertness: Awake/alert Behavior During Therapy: WFL for tasks assessed/performed Overall Cognitive Status: Impaired/Different from baseline Area of Impairment: Safety/judgement Safety/Judgement: Decreased awareness of deficits Problem Solving: Slow processing General Comments: followed all one step commands consistently.  Needed multimodal cues at times for scapula movements Difficult to assess due to: Intubated  Physical Exam: Blood pressure 108/65, pulse (!) 104, temperature 98.4 F (36.9 C), temperature source Oral, resp. rate 20, height 5\' 9"  (1.753 m), weight (!) 167.4 kg,  last menstrual period 06/17/2012, SpO2 (!) 89 %. Physical Exam  Vitals reviewed. Constitutional: She appears well-developed.  Super obesity  HENT:  Head: Normocephalic.  Facial abrasisions  Eyes: EOM are normal. Right eye exhibits no discharge. Left eye exhibits no discharge.  Neck: No tracheal deviation present. No thyromegaly present.  Respiratory: Effort normal. No respiratory distress.  GI: Soft. She exhibits no distension.  Musculoskeletal:     Comments: LE edema  Neurological: She is alert.  Patient is awake and alert.   Makes good eye contact with examiner.   Follows commands.   Provides her name and age.   Motor: RUE: shoulder abduction 2+/5, elbow flexion/extension 3-/5, hand grip 4-/5 LUE: shoulder abduction 2/5, elbow flexion/extension 2+/5, hand grip 3/5 B/l LE: HF, KE 3+/5, ADF 4-/5 Sensation intact to light touch  Skin: Skin is warm and dry.  Sacral ulcer not examined See above  Psychiatric: She has a normal mood and affect. Her behavior is normal.    Results for orders placed or performed during the hospital encounter of 01/12/19 (from the past 48 hour(s))  Glucose, capillary     Status: Abnormal   Collection Time: 02/07/19  7:36 AM  Result Value Ref Range   Glucose-Capillary 249 (H) 70 - 99 mg/dL   Comment 1 Notify RN    Comment 2 Document in Chart   Glucose, capillary     Status: Abnormal   Collection Time: 02/07/19 11:26 AM  Result Value Ref Range   Glucose-Capillary 269 (H) 70 - 99 mg/dL   Comment 1 Notify RN    Comment 2 Document in Chart   Glucose, capillary     Status: Abnormal   Collection Time: 02/07/19  4:45 PM  Result Value Ref Range   Glucose-Capillary 217 (H) 70 - 99 mg/dL  Glucose, capillary     Status: Abnormal   Collection Time: 02/07/19  9:08 PM  Result Value Ref Range   Glucose-Capillary 321 (H) 70 - 99 mg/dL  CBC     Status: Abnormal   Collection Time: 02/08/19  5:10 AM  Result Value Ref Range   WBC 9.2 4.0 - 10.5 K/uL   RBC 4.81  3.87 - 5.11 MIL/uL   Hemoglobin 13.3 12.0 - 15.0 g/dL   HCT 39.8 36.0 - 46.0 %   MCV 82.7 80.0 - 100.0 fL   MCH 27.7 26.0 - 34.0 pg   MCHC 33.4 30.0 - 36.0 g/dL   RDW 20.0 (H) 11.5 - 15.5 %   Platelets  448 (H) 150 - 400 K/uL   nRBC 0.0 0.0 - 0.2 %    Comment: Performed at [facility], [place] [address], [place], [place] [address]  Comprehensive metabolic panel     Status: Abnormal   Collection Time: 02/08/19  5:10 AM  Result Value Ref Range   Sodium 135 135 - 145 mmol/L    Comment: DELTA CHECK NOTED   Potassium 2.9 (L) 3.5 - 5.1 mmol/L    Comment: DELTA CHECK NOTED   Chloride 93 (L) 98 - 111 mmol/L   CO2 29 22 - 32 mmol/L   Glucose, Bld 270 (H) 70 - 99 mg/dL   BUN 18 6 - 20 mg/dL   Creatinine, Ser 0.69 0.44 - 1.00 mg/dL   Calcium 9.4 8.9 - 10.3 mg/dL   Total Protein 6.6 6.5 - 8.1 g/dL   Albumin 3.2 (L) 3.5 - 5.0 g/dL   AST 20 15 - 41 U/L   ALT 31 0 - 44 U/L   Alkaline Phosphatase 66 38 - 126 U/L   Total Bilirubin 0.9 0.3 - 1.2 mg/dL   GFR calc non Af Amer >60 >60 mL/min   GFR calc Af Amer >60 >60 mL/min   Anion gap 13 5 - 15    Comment: Performed at [facility], [place] [address], [place], [place] [address]  Magnesium     Status: None   Collection Time: 02/08/19  5:10 AM  Result Value Ref Range   Magnesium 1.8 1.7 - 2.4 mg/dL    Comment: Performed at [facility], [place] [address], [place], [place] [address]  Phosphorus     Status: None   Collection Time: 02/08/19  5:10 AM  Result Value Ref Range   Phosphorus 3.4 2.5 - 4.6 mg/dL    Comment: Performed at [facility], [place] [address] [place], [place] [address]  Glucose, capillary     Status: Abnormal   Collection Time: 02/08/19  8:10 AM  Result Value Ref Range   Glucose-Capillary 303 (H) 70 - 99 mg/dL   Comment 1 Notify RN    Comment 2 Document in Chart   Glucose, capillary     Status: Abnormal   Collection Time: 02/08/19 11:50 AM  Result Value Ref  Range   Glucose-Capillary 262 (H) 70 - 99 mg/dL   Comment 1 Notify RN    Comment 2 Document in Chart   Glucose, capillary     Status: Abnormal   Collection Time: 02/08/19  4:57 PM  Result Value Ref Range   Glucose-Capillary 212 (H) 70 - 99 mg/dL   Comment 1 Notify RN    Comment 2 Document in Chart   Glucose, capillary     Status: Abnormal   Collection Time: 02/08/19  8:49 PM  Result Value Ref Range   Glucose-Capillary 230 (H) 70 - 99 mg/dL  CBC     Status: Abnormal   Collection Time: 02/09/19  4:53 AM  Result Value Ref Range   WBC 7.9 4.0 - 10.5 K/uL   RBC 4.32 3.87 - 5.11 MIL/uL   Hemoglobin 11.6 (L) 12.0 - 15.0 g/dL   HCT 35.8 (L) 36.0 - 46.0 %   MCV 82.9 80.0 - 100.0 fL   MCH 26.9 26.0 - 34.0 pg   MCHC 32.4 30.0 - 36.0 g/dL   RDW 19.9 (H) 11.5 - 15.5 %   Platelets 449 (H) 150 - 400 K/uL   nRBC 0.0 0.0 - 0.2 %  Comment: Performed at [facility], [place] [address] [place], [place] [address]   No results found.     Medical Problem List and Plan: 1.  Decreased functional mobility secondary to CIM due to acute hypoxemic respiratory failure due to ARDS/COVID-19 pneumonia.  Extubated 01/31/2019  -patient may shower  -ELOS/Goals: 27-32 days/Mod A  Admit to CIR. 2.  Antithrombotics: -DVT/anticoagulation: Lovenox.  Venous Doppler studies negative  -antiplatelet therapy: Aspirin 81 mg daily 3. Pain Management: Tylenol as needed 4. Mood: Provide emotional support  -antipsychotic agents: N/A 5. Neuropsych: This patient is capable of making decisions on her own behalf. 6. Skin/Wound Care: Routine skin checks 7. Fluids/Electrolytes/Nutrition: Routine in and outs.  CMP ordered. 8.  Acute on chronic diastolic congestive heart failure.  Echocardiogram ejection fraction 50%.  Monitor for any signs of fluid overload.  Continue Demadex 20 mg twice daily  Daily weights. 9.  Diabetes mellitus with peripheral neuropathy.  Hemoglobin A1c 7.1.  NovoLog 15 units 3 times  daily with meals, Lantus insulin 25 units twice daily.  Check blood sugars before meals and at bedtime.  Diabetic teaching  Monitor with increased mobility 10.  Cardiogenic shock.  Wean from pressors.  Monitor for orthostasis.  Continue Coreg 12.5 mg twice daily 11.  Super obesity.  BMI 54.50.  Dietary follow-up.  Encourage weight loss 12.  Hyperlipidemia.  Crestor 13.  History of anemia.  Continue iron supplement.  Latest hemoglobin 11.6.  CBC ordered. 14.  Sickle cell trait.  Follow-up outpatient.  Lavon Paganini Angiulli, PA-C 02/09/2019  I have personally performed a face to face diagnostic evaluation, including, but not limited to relevant history and physical exam findings, of this patient and developed relevant assessment and plan.  Additionally, I have reviewed and concur with the physician assistant's documentation above.  Delice Lesch, MD, ABPMR  The patient's status has not changed. The original post admission physician evaluation remains appropriate, and any changes from the pre-admission screening or documentation from the acute chart are noted above.   Delice Lesch, MD, ABPMR

## 2019-02-09 NOTE — Progress Notes (Signed)
Nsg Discharge Note  Admit Date:  01/12/2019 Discharge date: 02/09/2019   ANTANISHA BERRIEN to be D/C'd [facility] per MD order.  AVS completed.  Copy for chart, and copy for patient signed, and dated. Patient/caregiver able to verbalize understanding.  Discharge Medication: Medications present in electronic record  Discharge Assessment: Vitals:   02/09/19 0425 02/09/19 1354  BP: 108/65 106/69  Pulse: (!) 104 100  Resp: 20 20  Temp: 98.4 F (36.9 C) 98.7 F (37.1 C)  SpO2: (!) 89% 96%    D/c Instructions-Education: Discharge instructions given to receiving facility. Patient escorted via stretcher.   Eustace Pen, RN 02/09/2019 2:00 PM

## 2019-02-10 ENCOUNTER — Inpatient Hospital Stay (HOSPITAL_COMMUNITY): Payer: Medicaid Other | Admitting: Occupational Therapy

## 2019-02-10 ENCOUNTER — Inpatient Hospital Stay (HOSPITAL_COMMUNITY): Payer: Medicaid Other | Admitting: Physical Therapy

## 2019-02-10 DIAGNOSIS — G7281 Critical illness myopathy: Principal | ICD-10-CM

## 2019-02-10 LAB — GLUCOSE, CAPILLARY
Glucose-Capillary: 265 mg/dL — ABNORMAL HIGH (ref 70–99)
Glucose-Capillary: 231 mg/dL — ABNORMAL HIGH (ref 70–99)
Glucose-Capillary: 216 mg/dL — ABNORMAL HIGH (ref 70–99)
Glucose-Capillary: 166 mg/dL — ABNORMAL HIGH (ref 70–99)

## 2019-02-10 MED ORDER — INSULIN GLARGINE 100 UNIT/ML ~~LOC~~ SOLN
26.0000 [IU] | Freq: Two times a day (BID) | SUBCUTANEOUS | Status: DC
  Administered 2019-02-10 – 2019-02-11 (×2): 26 [IU] via SUBCUTANEOUS
  Filled 2019-02-10 (×5): qty 0.26

## 2019-02-10 MED ORDER — ENOXAPARIN SODIUM 80 MG/0.8ML ~~LOC~~ SOLN
80.0000 mg | Freq: Every day | SUBCUTANEOUS | Status: DC
  Administered 2019-02-10 – 2019-02-12 (×3): 80 mg via SUBCUTANEOUS
  Filled 2019-02-10 (×3): qty 0.8

## 2019-02-10 NOTE — Evaluation (Signed)
Occupational Therapy Assessment and Plan  Patient Details  Name: Kaitlin [name] MRN: [id number] Date of Birth: Sep 17, 1965  OT Diagnosis: abnormal posture and muscle weakness (generalized) Rehab Potential: Rehab Potential (ACUTE ONLY): Good ELOS: 4 weeks   Today's Date: 02/10/2019 OT Individual Time: 2094-7096 & 1430-1530 OT Individual Time Calculation (min): 77 min   & 60 min  Problem List:  Patient Active Problem List   Diagnosis Date Noted  . 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 COVID-19   . History of anemia   . Super obese   . Poorly controlled type 2 diabetes mellitus with peripheral neuropathy ([place])   . Acute on chronic diastolic (congestive) heart failure ([place])   . Hypernatremia   . Shock circulatory ([place])   . Septic shock ([place])   . Hypotension 01/13/2019  . Acute respiratory failure with hypoxemia ([place]) 01/13/2019  . Acute respiratory distress syndrome (ARDS) due to COVID-19 virus ([place]) 01/12/2019  . Grade I diastolic dysfunction 28/36/6294  . 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 Dyspnea on exertion 05/19/2016  . Other fatigue 05/19/2016  . Absolute anemia 04/15/2016  . Left leg weakness 10/18/2014  . Left leg paresthesias 10/18/2014  . Acute left-sided weakness 10/18/2014  . Encounter for central line placement 03/08/2014  . Taking drug for chronic disease 03/08/2014  . H/O insertion of insulin pump 02/18/2014  . Long term current use of insulin ([place]) 02/18/2014  . Insulin pump in place 02/18/2014  . 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 Abnormal ballistocardiogram 05/18/2013  . Abnormal nuclear stress test 05/18/2013  . 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 NICM (nonischemic cardiomyopathy) ([place]) 05/01/2013  . Insulin dependent type 2 diabetes mellitus ([place]) 04/10/2013  . Sex counseling 04/10/2013  . Edema leg 04/10/2013  . Upper respiratory infection, viral 04/10/2013  . Pedal edema 03/29/2013  . Pneumonia 12/12/2012  . Essential hypertension  12/12/2012  . HLD (hyperlipidemia) 12/12/2012  . Tonsillar mass 12/01/2012  . 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 Leukocytosis   . Anemia   . High cholesterol   . Hypertension   . Blurred vision 09/11/2011  . Foot pain 09/11/2011  . Breast pain 09/11/2011  . Morbid obesity ([place]) 09/11/2011  . Fungal infection of nail 09/11/2011  . Avitaminosis D 09/11/2011    Past Medical History:  Past Medical History:  Diagnosis Date  . Anemia   . Bronchitis   . Cataracts, bilateral 01/12/2018  . CHF (congestive heart failure) ([place])   . Diabetes mellitus   . High cholesterol   . Hypertension    takes medicine to protect kidneys, does not have HTN  . Leukocytosis   . Morbid obesity ([place]) 09/11/2011   BMI 57  . Neuropathy 01/12/2018  . NICM (nonischemic cardiomyopathy) ([place]) 05/01/2013   Overview:  Last Assessment & Plan:  euvolemic on physical exam.   . Obesity   . Sickle cell trait ([place])   . Thymoma    Past Surgical History:  Past Surgical History:  Procedure Laterality Date  . COLONOSCOPY N/A 06/21/2017   Procedure: COLONOSCOPY;  Surgeon: Ronnette Juniper, MD;  Location: [facility] ENDOSCOPY;  Service: Gastroenterology;  Laterality: N/A;  . LEFT HEART CATHETERIZATION WITH CORONARY ANGIOGRAM N/A 05/25/2013   Procedure: LEFT HEART CATHETERIZATION WITH CORONARY ANGIOGRAM;  Surgeon: Troy Sine, MD;  Location: [facility] CATH LAB;  Service: Cardiovascular;  Laterality: N/A;  . LYMPH NODE BIOPSY Right 11/30/2012   Procedure: RIGHT TONSIL BIOPSY WITH FRESH FROZEN ANALYSIS;  Surgeon: Jodi Marble, MD;  Location: [facility] OR;  Service: ENT;  Laterality: Right;  . MEDIASTERNOTOMY N/A 01/12/2013   Procedure: MEDIAN STERNOTOMY;  Surgeon: Gaye Pollack, MD;  Location: [facility];  Service: Thoracic;  Laterality: N/A;  . MEDIASTINOTOMY CHAMBERLAIN MCNEIL Right 11/06/2012   Procedure: MEDIASTINOTOMY CHAMBERLAIN MCNEILPROCEDURE;  Surgeon: Gaye Pollack, MD;  Location: [facility];  Service: Thoracic;   Laterality: Right;  . POLYPECTOMY  06/21/2017   Procedure: POLYPECTOMY;  Surgeon: Ronnette Juniper, MD;  Location: [facility];  Service: Gastroenterology;;  . RESECTION OF A THYMOMA N/A 01/12/2013   Procedure: RESECTION OF A THYMOMA;  Surgeon: Gaye Pollack, MD;  Location: [facility];  Service: Thoracic;  Laterality: N/A;  . TONSILLECTOMY    . TUBAL LIGATION      Assessment & Plan Clinical Impression: Patient is a 54 y.o. year old female with history of anemia, bronchitis, bilateral cataracts, NICM,chronic diastolic congestive heart failure, type 2 diabetes mellitus, hyperlipidemia, hypertension, morbid obesity with BMI 54.50, sickle cell trait, history of thymoma with resection 2015.  History taken from chart review and patient. Patient lives with her family.  1 level apartment with level entry.  Independent prior to admission.  She is unemployed.  Patient does have family in the area to provide assistance at discharge.  She presented on 01/13/2019 with increasing SOB, cough, generalized weakness, and nausea. Patient had been exposed to her son who recently was positive for COVID-19.  In the ED WBC 8.7, hemoglobin 12.7, platelets 203,000, lactic acid normal, BUN 22, creatinine 1.17, D-dimer 1.54, troponin high-sensitivity 29, SARS coronavirus 01/12/2019 positive.  It was noted patient recently checked for coronavirus 11/19/2018 that was negative.  Chest x-ray bilateral diffuse patchy airspace opacities concerning for multifocal pneumonia.  Patient received remdesivir, dexamethasone as well as actemra.  Antibiotics were later broadened due to shock requiring pressors and stress steroids and has since been completed.  Received 1000 mL normal saline bolus for hypotension and her Coreg was initially held.  Echocardiogram with EF of 31%, grade 1 diastolic dysfunction without emboli. Bilateral lower extremity Dopplers negative for DVT.  Patient did require intubation and extubated on 01/31/2019 and weaned off supplemental  oxygen.  Patient did complete 21 days of therapy for COVID-19 sustaining improvement isolation later discontinued and patient was transferred from [place] to [facility] 02/03/2019.  Patient has been receiving wound care for partial-thickness blistered areas.  Her diet has been advanced to a regular consistency.  Patient transferred to CIR on 02/09/2019 .    Patient currently requires total with basic self-care skills and IADL secondary to muscle weakness, decreased cardiorespiratoy endurance and decreased oxygen support and decreased sitting balance, decreased standing balance, decreased postural control and decreased balance strategies.  Prior to hospitalization, patient could complete adl with independent .  Patient will benefit from skilled intervention to decrease level of assist with basic self-care skills, increase independence with basic self-care skills and increase level of independence with iADL prior to discharge home with care partner.  Anticipate patient will require 24 hour supervision and moderate physical assestance and follow up home health.  OT - End of Session Activity Tolerance: Tolerates 10 - 20 min activity with multiple rests Endurance Deficit: Yes Endurance Deficit Description: fatigue with adl tasks and bed mobility OT Assessment Rehab Potential (ACUTE ONLY): Good OT Barriers to Discharge: Weight OT Patient demonstrates impairments in the following area(s): Balance;Endurance;Motor;Skin Integrity OT Basic ADL's Functional Problem(s): Eating;Grooming;Bathing;Dressing;Toileting OT Transfers Functional Problem(s): Toilet;Tub/Shower OT Additional Impairment(s): Fuctional Use of Upper  Extremity OT Plan OT Intensity: Minimum of 1-2 x/day, 45 to 90 minutes OT Frequency: 5 out of 7 days OT Duration/Estimated Length of Stay: 4 weeks OT Treatment/Interventions: Balance/vestibular training;Self Care/advanced ADL retraining;Therapeutic Exercise;Wheelchair  propulsion/positioning;DME/adaptive equipment instruction;Skin care/wound managment;UE/LE Strength taining/ROM;Community reintegration;Patient/family education;UE/LE Coordination activities;Discharge planning;Functional mobility training;Therapeutic Activities OT Self Feeding Anticipated Outcome(s): independent OT Basic Self-Care Anticipated Outcome(s): min/mod A OT Toileting Anticipated Outcome(s): min A OT Bathroom Transfers Anticipated Outcome(s): min A OT Recommendation Patient destination: Home Follow Up Recommendations: Home health OT Equipment Recommended: 3 in 1 bedside comode;Tub/shower bench   Skilled Therapeutic Intervention AM session:  Patient in bed, alert and pleasant, 2L O2 via [place].  Evaluation completed as documented below.  Reviewed role of OT, plan of care, safety with transfers, goals for therapy, schedule.  She is aware of needs and has good recall.  She presents with significant weakness and endurance deficits limiting ability to perform all aspects of self care and mobility.  She is a good candidate for IP rehab to maximize independence and facilitate a safe return to home with family assistance/support.   Provided education and assistance with bathing - she requires assist of 2 due to body habitus and weakness.  No clothing available at this time - she requires assist of 2 people to donn incontinence brief, TEDS, slipper socks and clean hospital gown.  Oral care completed with min A at bed level.   Practiced rolling in bed - max A of 2 with bed rails.  Placed lift sling with assist of 2 and utilized maximove for transfer to w/c to assess fit - returned to bed via maximove.  She remained in bed at close of session, bed alarm set and call bell in reach.     PM session:  Patient is alert and eager to participate in therapy session although states that she is feeling tired.  Completed supine to side lying with max A of 2, side lying to sitting edge of mat max A of 2.  She is able  to tolerate unsupported sitting for 10 minutes with trunk/posture activities and AROM UB and LB - she is able to sit briefly with CGA/min a but cannot maintain needing max A at times to maintain sitting position.  Returned to supine with max A of 2, max a of 2 to scoot in bed and position in side lying.  Completed UB AAROM activities bilateral UEs, theraband scapular and elbow extension, provided theraputty.  Patient having trouble staying awake at end of session but states that she is pleased with progress today.  She remained in bed at close of session, nursing completing room change, call bell in hand.    OT Evaluation Precautions/Restrictions  Precautions Precautions: Fall Precaution Comments: morbid obesity, monitor SpO2 and HR Restrictions Weight Bearing Restrictions: No General   Vital Signs  Pain Pain Assessment Pain Scale: 0-10 Pain Score: 4  Pain Type: Acute pain Pain Location: Hip Pain Orientation: Left Pain Descriptors / Indicators: Tender Pain Onset: Gradual Pain Intervention(s): Repositioned Home Living/Prior Functioning Home Living Available Help at Discharge: Available 24 hours/day, Family Type of Home: Apartment Home Access: Level entry Home Layout: One level Bathroom Shower/Tub: Tub/shower unit, Industrial/product designer: Yes Additional Comments: plans to go to daughter's home  Lives With: Daughter Prior Function Level of Independence: Independent with homemaking with ambulation, Independent with gait, Independent with transfers, Independent with basic ADLs Comments: cares for 3 grandchildren (7y.o., 2y.o. and 53monthold) during the day ADL ADL  Eating: Minimal assistance Where Assessed-Eating: Bed level Grooming: Minimal assistance Where Assessed-Grooming: Bed level Upper Body Bathing: Maximal assistance Where Assessed-Upper Body Bathing: Bed level Lower Body Bathing: Dependent Where Assessed-Lower Body Bathing: Bed  level Upper Body Dressing: Maximal assistance Where Assessed-Upper Body Dressing: Bed level Lower Body Dressing: Dependent Where Assessed-Lower Body Dressing: Bed level Toileting: Dependent Where Assessed-Toileting: Bed level Toilet Transfer: Other (comment)(maximove bed to w/c today, unsafe at this time to transfer to commode) ADL Comments: significant fatigue with adl tasks Vision Baseline Vision/History: Wears glasses Wears Glasses: Reading only Patient Visual Report: No change from baseline Vision Assessment?: No apparent visual deficits Perception  Perception: Within Functional Limits Praxis Praxis: Intact Cognition Overall Cognitive Status: Within Functional Limits for tasks assessed Arousal/Alertness: Awake/alert Orientation Level: Person;Place;Situation Person: Oriented Place: Oriented Situation: Oriented Year: 2021 Month: January Day of Week: Correct Memory: Appears intact Immediate Memory Recall: Sock;Blue;Bed Memory Recall Sock: Without Cue Memory Recall Blue: Without Cue Memory Recall Bed: Without Cue Attention: Focused;Sustained Focused Attention: Appears intact Sustained Attention: Appears intact Safety/Judgment: Appears intact Sensation Sensation Light Touch: Impaired Detail Peripheral sensation comments: hx of B LE peripheral neuropathy but reports decreased sensation in L LE compared to R Light Touch Impaired Details: Impaired LLE Hot/Cold: Not tested Proprioception: Impaired by gross assessment Stereognosis: Not tested Additional Comments: UB grossly intact Coordination Gross Motor Movements are Fluid and Coordinated: No Coordination and Movement Description: impaired due to significant generalized weakness and poor trunk control Finger Nose Finger Test: left unable due to shoulder limitations, right slow Motor  Motor Motor: Other (comment);Abnormal postural alignment and control Motor - Skilled Clinical Observations: significant generalized  weakness and impaired activity tolerance as well as poor trunk control Mobility  Bed Mobility Bed Mobility: Rolling Right;Rolling Left;Supine to Sit;Sit to Supine;Scooting to [facility] Rolling Right: 2 Helpers;Maximal Assistance - Patient 25-49% Rolling Left: Maximal Assistance - Patient 25-49%;2 Helpers Supine to Sit: Maximal Assistance - Patient - Patient 25-49%;2 Helpers Sit to Supine: Maximal Assistance - Patient 25-49%;2 Helpers Scooting to [facility]: 2 Helpers;Total Assistance - Patient < 25%  Trunk/Postural Assessment  Cervical Assessment Cervical Assessment: Exceptions to WFL(cervical protraction with downward gaze in sitting) Thoracic Assessment Thoracic Assessment: Exceptions to WFL(rounded shoulders, thoracic kyphosis) Lumbar Assessment Lumbar Assessment: Exceptions to WFL(posterior pelvic tilt in sitting) Postural Control Postural Control: Deficits on evaluation Trunk Control: initially requires max assist for trunk control in sitting progresed to min assist while using B UE support, fatigues again requiring mod/max A later in day Righting Reactions: impaired due to weakness  Balance Balance Balance Assessed: Yes Static Sitting Balance Static Sitting - Balance Support: Bilateral upper extremity supported Static Sitting - Level of Assistance: 2: Max assist Static Sitting - Comment/# of Minutes: 10 minutes pm session Extremity/Trunk Assessment RUE Assessment Passive Range of Motion (PROM) Comments: WFL Active Range of Motion (AROM) Comments: shoulder 3/4, distal WFL General Strength Comments: 3/5 LUE Assessment Passive Range of Motion (PROM) Comments: WFL, painful end range Active Range of Motion (AROM) Comments: < 1/4 against gravity at shoulder, 3/4 to full distal General Strength Comments: 2/5 prox, 3/5 distal     Refer to Care Plan for Long Term Goals  Recommendations for other services: None    Discharge Criteria: Patient will be discharged from OT if patient refuses  treatment 3 consecutive times without medical reason, if treatment goals not met, if there is a change in medical status, if patient makes no progress towards goals or if patient is discharged from hospital.  The above assessment,  treatment plan, treatment alternatives and goals were discussed and mutually agreed upon: by patient  Carlos Levering 02/10/2019, 4:10 PM

## 2019-02-10 NOTE — Evaluation (Signed)
Physical Therapy Assessment and Plan  Patient Details  Name: Kaitlin [name] MRN: [id number] Date of Birth: [birth year]  PT Diagnosis: Abnormal posture, Abnormality of gait, Difficulty walking, Impaired sensation, Muscle weakness and Pain in low back Rehab Potential: Fair ELOS: ~4 weeks   Today's Date: 02/10/2019 PT Individual Time: 1103-1205 PT Individual Time Calculation (min): 62 min    Problem List:  Patient Active Problem List   Diagnosis Date Noted  . 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 COVID-19   . History of anemia   . Super obese   . Poorly controlled type 2 diabetes mellitus with peripheral neuropathy ([place])   . Acute on chronic diastolic (congestive) heart failure ([place])   . Hypernatremia   . Shock circulatory ([place])   . Septic shock ([place])   . Hypotension 01/13/2019  . Acute respiratory failure with hypoxemia ([place]) 01/13/2019  . Acute respiratory distress syndrome (ARDS) due to COVID-19 virus ([place]) 01/12/2019  . Grade I diastolic dysfunction 69/62/9528  . 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 Dyspnea on exertion 05/19/2016  . Other fatigue 05/19/2016  . Absolute anemia 04/15/2016  . Left leg weakness 10/18/2014  . Left leg paresthesias 10/18/2014  . Acute left-sided weakness 10/18/2014  . Encounter for central line placement 03/08/2014  . Taking drug for chronic disease 03/08/2014  . H/O insertion of insulin pump 02/18/2014  . Long term current use of insulin ([place]) 02/18/2014  . Insulin pump in place 02/18/2014  . 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 Abnormal ballistocardiogram 05/18/2013  . Abnormal nuclear stress test 05/18/2013  . 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 NICM (nonischemic cardiomyopathy) ([place]) 05/01/2013  . Insulin dependent type 2 diabetes mellitus ([place]) 04/10/2013  . Sex counseling 04/10/2013  . Edema leg 04/10/2013  . Upper respiratory infection, viral 04/10/2013  . Pedal edema 03/29/2013  . Pneumonia 12/12/2012  . Essential  hypertension 12/12/2012  . HLD (hyperlipidemia) 12/12/2012  . Tonsillar mass 12/01/2012  . 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 Leukocytosis   . Anemia   . High cholesterol   . Hypertension   . Blurred vision 09/11/2011  . Foot pain 09/11/2011  . Breast pain 09/11/2011  . Morbid obesity ([place]) 09/11/2011  . Fungal infection of nail 09/11/2011  . Avitaminosis D 09/11/2011    Past Medical History:  Past Medical History:  Diagnosis Date  . Anemia   . Bronchitis   . Cataracts, bilateral 01/12/2018  . CHF (congestive heart failure) ([place])   . Diabetes mellitus   . High cholesterol   . Hypertension    takes medicine to protect kidneys, does not have HTN  . Leukocytosis   . Morbid obesity ([place]) 09/11/2011   BMI 57  . Neuropathy 01/12/2018  . NICM (nonischemic cardiomyopathy) ([place]) 05/01/2013   Overview:  Last Assessment & Plan:  euvolemic on physical exam.   . Obesity   . Sickle cell trait ([place])   . Thymoma    Past Surgical History:  Past Surgical History:  Procedure Laterality Date  . COLONOSCOPY N/A 06/21/2017   Procedure: COLONOSCOPY;  Surgeon: Ronnette Juniper, MD;  Location: [facility];  Service: Gastroenterology;  Laterality: N/A;  . LEFT HEART CATHETERIZATION WITH CORONARY ANGIOGRAM N/A 05/25/2013   Procedure: LEFT HEART CATHETERIZATION WITH CORONARY ANGIOGRAM;  Surgeon: Troy Sine, MD;  Location: [facility] CATH LAB;  Service: Cardiovascular;  Laterality: N/A;  . LYMPH NODE BIOPSY Right 11/30/2012   Procedure: RIGHT TONSIL BIOPSY WITH FRESH FROZEN ANALYSIS;  Surgeon: Jodi Marble,  MD;  Location: [facility];  Service: ENT;  Laterality: Right;  . MEDIASTERNOTOMY N/A 01/12/2013   Procedure: MEDIAN STERNOTOMY;  Surgeon: Gaye Pollack, MD;  Location: [facility];  Service: Thoracic;  Laterality: N/A;  . MEDIASTINOTOMY CHAMBERLAIN MCNEIL Right 11/06/2012   Procedure: MEDIASTINOTOMY CHAMBERLAIN MCNEILPROCEDURE;  Surgeon: Gaye Pollack, MD;  Location: [facility];  Service:  Thoracic;  Laterality: Right;  . POLYPECTOMY  06/21/2017   Procedure: POLYPECTOMY;  Surgeon: Ronnette Juniper, MD;  Location: [facility];  Service: Gastroenterology;;  . RESECTION OF A THYMOMA N/A 01/12/2013   Procedure: RESECTION OF A THYMOMA;  Surgeon: Gaye Pollack, MD;  Location: [facility];  Service: Thoracic;  Laterality: N/A;  . TONSILLECTOMY    . TUBAL LIGATION      Assessment & Plan Clinical Impression: Patient is a 54 y.o. year old female right-handed female with history of anemia, bronchitis, bilateral cataracts, NICM,chronic diastolic congestive heart failure, type 2 diabetes mellitus, hyperlipidemia, hypertension, morbid obesity with BMI 54.50, sickle cell trait, history of thymoma with resection 2015.  History taken from chart review and patient. Patient lives with her family.  1 level apartment with level entry.  Independent prior to admission.  She is unemployed.  Patient does have family in the area to provide assistance at discharge.  She presented on 01/13/2019 with increasing SOB, cough, generalized weakness, and nausea. Patient had been exposed to her son who recently was positive for COVID-19.  In the ED WBC 8.7, hemoglobin 12.7, platelets 203,000, lactic acid normal, BUN 22, creatinine 1.17, D-dimer 1.54, troponin high-sensitivity 29, SARS coronavirus 01/12/2019 positive.  It was noted patient recently checked for coronavirus 11/19/2018 that was negative.  Chest x-ray bilateral diffuse patchy airspace opacities concerning for multifocal pneumonia.  Patient received remdesivir, dexamethasone as well as actemra.  Antibiotics were later broadened due to shock requiring pressors and stress steroids and has since been completed.  Received 1000 mL normal saline bolus for hypotension and her Coreg was initially held.  Echocardiogram with EF of 10%, grade 1 diastolic dysfunction without emboli. Bilateral lower extremity Dopplers negative for DVT.  Patient did require intubation and extubated on 01/31/2019  and weaned off supplemental oxygen.  Patient did complete 21 days of therapy for COVID-19 sustaining improvement isolation later discontinued and patient was transferred from [place] to [facility] 02/03/2019.  Patient has been receiving wound care for partial-thickness blistered areas.  Her diet has been advanced to a regular consistency.  Therapy evaluations completed and patient was admitted for a comprehensive rehab program. Please see preadmission assessment from earlier today as well. Patient transferred to CIR on 02/09/2019 .   Patient currently requires total with mobility secondary to muscle weakness, decreased cardiorespiratoy endurance and decreased oxygen support, unbalanced muscle activation and decreased sitting balance, decreased standing balance, decreased postural control and decreased balance strategies.  Prior to hospitalization, patient was independent  with mobility and lived with Daughter in a [place] home.  Home access is  Level entry.  Patient will benefit from skilled PT intervention to maximize safe functional mobility, minimize fall risk and decrease caregiver burden for planned discharge home with 24 hour assist.  Anticipate patient will benefit from follow up [facility] at discharge.  PT - End of Session Activity Tolerance: Tolerates 30+ min activity with multiple rests Endurance Deficit: Yes Endurance Deficit Description: requires frequent supine rest breaks PT Assessment Rehab Potential (ACUTE/IP ONLY): Fair PT Barriers to Discharge: Decreased caregiver support;Incontinence;Lack of/limited family support;Weight PT Patient demonstrates impairments  in the following area(s): Balance;Safety;Edema;Sensory;Skin Integrity;Endurance;Motor;Nutrition;Pain PT Transfers Functional Problem(s): Bed Mobility;Bed to Chair;Car;Furniture PT Locomotion Functional Problem(s): Ambulation;Wheelchair Mobility;Stairs PT Plan PT Intensity: Minimum of 1-2 x/day ,45 to 90  minutes PT Frequency: 5 out of 7 days PT Duration Estimated Length of Stay: ~4 weeks PT Treatment/Interventions: Ambulation/gait training;DME/adaptive equipment instruction;Neuromuscular re-education;Psychosocial support;UE/LE Strength taining/ROM;Stair training;Wheelchair propulsion/positioning;Balance/vestibular training;Discharge planning;Functional electrical stimulation;Pain management;Skin care/wound management;Therapeutic Activities;UE/LE Coordination activities;Cognitive remediation/compensation;Disease management/prevention;Functional mobility training;Patient/family education;Splinting/orthotics;Therapeutic Exercise;Visual/perceptual remediation/compensation PT Transfers Anticipated Outcome(s): mod assist PT Locomotion Anticipated Outcome(s): TBD if pt will be a functional ambulator PT Recommendation Recommendations for Other Services: Neuropsych consult;Therapeutic Recreation consult Therapeutic Recreation Interventions: Stress management Follow Up Recommendations: Home health PT;24 hour supervision/assistance Patient destination: Home Equipment Recommended: To be determined  Skilled Therapeutic Intervention Evaluation completed (see details above and below) with education on PT POC and goals and individual treatment initiated with focus on bed mobility, activity tolerance, LE strengthening, trunk control, and education regarding daily therapy schedule, weekly team meetings, purpose of PT evaluation, and other CIR information. Pt received supine in bed and agreeable to therapy session. Pt reports having been incontinent of bladder. Rolling R/L in bed with +2 max assist and cuing with manual facilitation for placement of LEs and UEs to assist with rolling while therapist performed total assist LB clothing management and peri-care. Scoot towards [facility] with +2 total assist via bed sheets and bed in trendelenburg - cuing and manual facilitation for placement of B LEs and R UE to assist. Performed  supine L LE exercises of active assisted heel slides, active assisted straight leg raise, min manually resisted ankle DF/PF, and active assisted hip abduction/adduction. Pt agreeable to transfer EOB. Supine>sit, HOB partially elevated, via logroll technique to R EOB so pt could use R UE to assist with trunk upright and +2 max assist for rolling, B LE management, and trunk upright. Once sitting EOB pt required max assist for trunk control due to posterior lean +2 assist for safety - with manual facilitation and cuing for increased anterior weight shift pt able to progress to min assist trunk support for short durations - using B UE support throughout - tolerated sitting EOB ~24mnutes. Sit>R sidelying>supine with +2 max assist for B LE management and trunk descent - max cuing for sequencing. Supine scoot towards HOB, bed in [place], and +3 total assist this time with 3rd person holding B LEs in hooklying position to increase pt's ability to use B LEs to assist with scoot. Pt left supine in bed with HOB elevated, needs in reach, and lines intact.   PT Evaluation Precautions/Restrictions Precautions Precautions: Fall Precaution Comments: morbid obesity, monitor SpO2 and HR Restrictions Weight Bearing Restrictions: No Pain Pain Assessment Pain Scale: 0-10 Pain Score: 8  Pain Type: Acute pain Pain Location: Back Pain Orientation: Right;Lower Pain Descriptors / Indicators: Aching Pain Onset: On-going(increases with activity) Pain Intervention(s): Rest;Relaxation;Repositioned Home Living/Prior Functioning Home Living Available Help at Discharge: Available 24 hours/day;Family(daughter works from home) Type of Home: Apartment Home Access: Level entry Home Layout: One level  Lives With: Daughter Prior Function Level of Independence: Independent with homemaking with ambulation;Independent with gait;Independent with transfers Comments: cares for 3 grandchildren (7y.o., 235.o. and 672monthld)  during the day Perception  Perception Perception: Within Functional Limits Praxis Praxis: Intact  Cognition Overall Cognitive Status: Within Functional Limits for tasks assessed Arousal/Alertness: Lethargic Orientation Level: Oriented X4 Attention: Focused;Sustained Focused Attention: Appears intact Sustained Attention: Appears intact Safety/Judgment: Appears intact Sensation Sensation Light Touch: Impaired Detail Peripheral sensation comments: hx  of B LE peripheral neuropathy but reports decreased sensation in L LE compared to R Light Touch Impaired Details: Impaired LLE Hot/Cold: Not tested Proprioception: Impaired by gross assessment Stereognosis: Not tested Coordination Gross Motor Movements are Fluid and Coordinated: No Coordination and Movement Description: impaired due to significant generalized weakness and poor trunk control Motor  Motor Motor: Other (comment);Abnormal postural alignment and control Motor - Skilled Clinical Observations: significant generalized weakness and impaired activity tolerance as well as poor trunk control  Mobility Bed Mobility Bed Mobility: Rolling Right;Rolling Left;Supine to Sit;Sit to Supine;Scooting to [facility] Rolling Right: 2 Helpers;Maximal Assistance - Patient 25-49% Rolling Left: Maximal Assistance - Patient 25-49%;2 Helpers Supine to Sit: Maximal Assistance - Patient - Patient 25-49%;2 Helpers Sit to Supine: Maximal Assistance - Patient 25-49%;2 Helpers Scooting to [facility]: 2 Helpers;Total Assistance - Patient < 25% Locomotion  Gait Ambulation: No Gait Gait: No Stairs / Additional Locomotion Stairs: No Wheelchair Mobility Wheelchair Mobility: No  Trunk/Postural Assessment  Cervical Assessment Cervical Assessment: Exceptions to WFL(cervical protraction with downward gaze in sitting) Thoracic Assessment Thoracic Assessment: Exceptions to WFL(rounded shoulders, thoracic kyphosis) Lumbar Assessment Lumbar Assessment: Exceptions to  WFL(posterior pelvic tilt in sitting) Postural Control Postural Control: Deficits on evaluation Trunk Control: initially requires max assist for trunk control in sitting progresed to min assist while using B UE support Righting Reactions: impaired due to weakness  Balance Balance Balance Assessed: Yes Static Sitting Balance Static Sitting - Balance Support: Bilateral upper extremity supported Static Sitting - Level of Assistance: 2: Max assist(+2) Static Sitting - Comment/# of Minutes: ~12mn Extremity Assessment      RLE Assessment RLE Assessment: Exceptions to [facility] of Motion (PROM) Comments: WFL RLE Strength Right Hip Flexion: 3-/5 Right Hip ABduction: 2+/5 Right Hip ADduction: 3-/5 Right Knee Flexion: 3/5 Right Knee Extension: 3+/5 Right Ankle Dorsiflexion: 3-/5 Right Ankle Plantar Flexion: 3-/5 LLE Assessment LLE Assessment: Exceptions to [facility] CenterPass[facility] Range of Motion (PROM) Comments: WFL LLE Strength Left Hip Flexion: 2+/5 Left Hip ABduction: 2+/5 Left Hip ADduction: 2+/5 Left Knee Extension: 3+/5 Left Ankle Dorsiflexion: 3-/5 Left Ankle Plantar Flexion: 3-/5    Refer to Care Plan for Long Term Goals  Recommendations for other services: Neuropsych and Therapeutic Recreation  Stress management  Discharge Criteria: Patient will be discharged from PT if patient refuses treatment 3 consecutive times without medical reason, if treatment goals not met, if there is a change in medical status, if patient makes no progress towards goals or if patient is discharged from hospital.  The above assessment, treatment plan, treatment alternatives and goals were discussed and mutually agreed upon: by patient  CTawana Scale PT, DPT 02/10/2019, 7:56 AM

## 2019-02-10 NOTE — Progress Notes (Signed)
[facility] PHYSICAL MEDICINE & REHABILITATION PROGRESS NOTE   Subjective/Complaints: No complaints this morning. Has some lower back pain. Tylenol provides relief. Having regular BM. Not sleeping well due to frequent urination and BM at night.    Objective:   No results found. Recent Labs    02/09/19 0453 02/09/19 1453  WBC 7.9 9.3  HGB 11.6* 13.0  HCT 35.8* 38.9  PLT 449* 539*   Recent Labs    02/08/19 0510 02/08/19 0510 02/09/19 0453 02/09/19 1453  NA 135  --  138  --   K 2.9*  --  3.6  --   CL 93*  --  96*  --   CO2 29  --  30  --   GLUCOSE 270*  --  286*  --   BUN 18  --  17  --   CREATININE 0.69   < > 0.68 0.86  CALCIUM 9.4  --  9.5  --    < > = values in this interval not displayed.    Intake/Output Summary (Last 24 hours) at 02/10/2019 1319 Last data filed at 02/10/2019 0730 Gross per 24 hour  Intake 420 ml  Output 250 ml  Net 170 ml     Physical Exam: Vital Signs Blood pressure 115/71, pulse (!) 103, temperature 97.8 F (36.6 C), resp. rate 18, height 5\' 9"  (1.753 m), weight (!) 157.9 kg, last menstrual period 06/17/2012, SpO2 100 %. Vitals reviewed. Constitutional: She appears well-developed.  Super obesity  HENT:  Head: Normocephalic.  Facial abrasisions  Eyes: EOM are normal. Right eye exhibits no discharge. Left eye exhibits no discharge.  Neck: No tracheal deviation present. No thyromegaly present.  Respiratory: Effort normal. No respiratory distress.  GI: Soft. She exhibits no distension.  Musculoskeletal:     Comments: LE edema  Neurological: She is alert.  Patient is awake and alert.   Makes good eye contact with examiner.   Follows commands.   Provides her name and age.   Motor: RUE: shoulder abduction 2+/5, elbow flexion/extension 3-/5, hand grip 4-/5 LUE: shoulder abduction 2/5, elbow flexion/extension 2+/5, hand grip 3/5 B/l LE: HF, KE 3+/5, ADF 4-/5 Sensation intact to light touch  Skin: Skin is warm and dry.  Sacral ulcer  not examined See above  Psychiatric: She has a normal mood and affect. Her behavior is normal.    Assessment/Plan: 1. Functional deficits secondary to critical illness myopathy which require 3+ hours per day of interdisciplinary therapy in a comprehensive inpatient rehab setting.  Physiatrist is providing close team supervision and 24 hour management of active medical problems listed below.  Physiatrist and rehab team continue to assess barriers to discharge/monitor patient progress toward functional and medical goals  Care Tool:  Bathing              Bathing assist       Upper Body Dressing/Undressing Upper body dressing   What is the patient wearing?: Hospital gown only    Upper body assist Assist Level: Maximal Assistance - Patient 25 - 49%    Lower Body Dressing/Undressing Lower body dressing      What is the patient wearing?: Hospital gown only     Lower body assist Assist for lower body dressing: Maximal Assistance - Patient 25 - 49%     Toileting Toileting    Toileting assist Assist for toileting: 2 Helpers     Transfers Chair/bed transfer  Transfers assist  Chair/bed transfer activity did not occur: Safety/medical concerns  Chair/bed transfer assist level: Dependent - mechanical lift     Locomotion Ambulation   Ambulation assist   Ambulation activity did not occur: Safety/medical concerns          Walk 10 feet activity   Assist  Walk 10 feet activity did not occur: Safety/medical concerns        Walk 50 feet activity   Assist Walk 50 feet with 2 turns activity did not occur: Safety/medical concerns         Walk 150 feet activity   Assist Walk 150 feet activity did not occur: Safety/medical concerns         Walk 10 feet on uneven surface  activity   Assist Walk 10 feet on uneven surfaces activity did not occur: Safety/medical concerns         Wheelchair     Assist Will patient use wheelchair at  discharge?: (TBD)             Wheelchair 50 feet with 2 turns activity    Assist            Wheelchair 150 feet activity     Assist          Blood pressure 115/71, pulse (!) 103, temperature 97.8 F (36.6 C), resp. rate 18, height 5\' 9"  (1.753 m), weight (!) 157.9 kg, last menstrual period 06/17/2012, SpO2 100 %.   Medical Problem List and Plan: 1.  Decreased functional mobility secondary to CIM due to acute hypoxemic respiratory failure due to ARDS/COVID-19 pneumonia.  Extubated 01/31/2019             -patient may shower             -ELOS/Goals: 27-32 days/Mod A             Initial CIR evaluations today.  2.  Antithrombotics: -DVT/anticoagulation: Lovenox.  Venous Doppler studies negative             -antiplatelet therapy: Aspirin 81 mg daily 3. Pain Management: Tylenol as needed 4. Mood: Provide emotional support             -antipsychotic agents: N/A 5. Neuropsych: This patient is capable of making decisions on her own behalf. 6. Skin/Wound Care: Routine skin checks 7. Fluids/Electrolytes/Nutrition: Routine in and outs.  CMP ordered for 2/1 8.  Acute on chronic diastolic congestive heart failure.  Echocardiogram ejection fraction 50%.  Monitor for any signs of fluid overload.  Continue Demadex 20 mg twice daily             Daily weights. 9.  Diabetes mellitus with peripheral neuropathy.  Hemoglobin A1c 7.1.  NovoLog 15 units 3 times daily with meals, Lantus insulin 25 units twice daily.  Check blood sugars before meals and at bedtime.  Diabetic teaching  1/30: CBG most recent reads: 231, 265, 166, 289- uncontrolled. Will increase Lantus to 26U BID.              Monitor with increased mobility 10.  Cardiogenic shock.  Wean from pressors.  Monitor for orthostasis.  Continue Coreg 12.5 mg twice daily 11.  Super obesity.  BMI 54.50.  Dietary follow-up.  Encourage weight loss 12.  Hyperlipidemia.  Crestor 13.  History of anemia.  Continue iron supplement.  Latest  hemoglobin 11.6.             CBC ordered for 2/1 14.  Sickle cell trait.  Follow-up outpatient.     LOS: 1 days A FACE TO FACE  EVALUATION WAS PERFORMED  Martha Clan P Amorita Vanrossum 02/10/2019, 1:19 PM

## 2019-02-11 LAB — GLUCOSE, CAPILLARY
Glucose-Capillary: 326 mg/dL — ABNORMAL HIGH (ref 70–99)
Glucose-Capillary: 346 mg/dL — ABNORMAL HIGH (ref 70–99)
Glucose-Capillary: 184 mg/dL — ABNORMAL HIGH (ref 70–99)
Glucose-Capillary: 143 mg/dL — ABNORMAL HIGH (ref 70–99)

## 2019-02-11 MED ORDER — INSULIN GLARGINE 100 UNIT/ML ~~LOC~~ SOLN
27.0000 [IU] | Freq: Two times a day (BID) | SUBCUTANEOUS | Status: DC
  Administered 2019-02-11 – 2019-02-13 (×4): 27 [IU] via SUBCUTANEOUS
  Filled 2019-02-11 (×5): qty 0.27

## 2019-02-11 NOTE — Progress Notes (Signed)
[facility] PHYSICAL MEDICINE & REHABILITATION PROGRESS NOTE   Subjective/Complaints: No complaints this morning. Having regular BM. Asks whether oxygen may be removed as she has been satting well on room air. Denies SOB.    Objective:   No results found. Recent Labs    02/09/19 0453 02/09/19 1453  WBC 7.9 9.3  HGB 11.6* 13.0  HCT 35.8* 38.9  PLT 449* 539*   Recent Labs    02/09/19 0453 02/09/19 1453  NA 138  --   K 3.6  --   CL 96*  --   CO2 30  --   GLUCOSE 286*  --   BUN 17  --   CREATININE 0.68 0.86  CALCIUM 9.5  --     Intake/Output Summary (Last 24 hours) at 02/11/2019 1448 Last data filed at 02/11/2019 1323 Gross per 24 hour  Intake 752 ml  Output --  Net 752 ml     Physical Exam: Vital Signs Blood pressure 125/83, pulse (!) 106, temperature 98.6 F (37 C), temperature source Oral, resp. rate 20, height 5\' 9"  (1.753 m), weight 123.4 kg, last menstrual period 06/17/2012, SpO2 98 %. Vitals reviewed. Constitutional: She appears well-developed.  Super obesity  HENT: [place] in place. Head: Normocephalic.  Facial abrasisions  Eyes: EOM are normal. Right eye exhibits no discharge. Left eye exhibits no discharge.  Neck: No tracheal deviation present. No thyromegaly present.  Respiratory: Effort normal. No respiratory distress.  GI: Soft. She exhibits no distension.  Musculoskeletal:     Comments: LE edema  Neurological: She is alert.  Patient is awake and alert.   Makes good eye contact with examiner.   Follows commands.   Provides her name and age.   Motor: RUE: shoulder abduction 2+/5, elbow flexion/extension 3-/5, hand grip 4-/5 LUE: shoulder abduction 2/5, elbow flexion/extension 2+/5, hand grip 3/5 B/l LE: HF, KE 3+/5, ADF 4-/5 Sensation intact to light touch  Skin: Skin is warm and dry.  Sacral ulcer not examined See above  Psychiatric: She has a normal mood and affect. Her behavior is normal.    Assessment/Plan: 1. Functional deficits  secondary to critical illness myopathy which require 3+ hours per day of interdisciplinary therapy in a comprehensive inpatient rehab setting.  Physiatrist is providing close team supervision and 24 hour management of active medical problems listed below.  Physiatrist and rehab team continue to assess barriers to discharge/monitor patient progress toward functional and medical goals  Care Tool:  Bathing    Body parts bathed by patient: Chest, Abdomen, Face   Body parts bathed by helper: Right arm, Left arm, Chest, Abdomen, Front perineal area, Buttocks, Right upper leg, Left upper leg, Right lower leg, Left lower leg     Bathing assist Assist Level: 2 Helpers     Upper Body Dressing/Undressing Upper body dressing   What is the patient wearing?: Hospital gown only    Upper body assist Assist Level: Total Assistance - Patient < 25%    Lower Body Dressing/Undressing Lower body dressing      What is the patient wearing?: Incontinence brief     Lower body assist Assist for lower body dressing: 2 Helpers     Toileting Toileting    Toileting assist Assist for toileting: 2 Helpers     Transfers Chair/bed transfer  Transfers assist  Chair/bed transfer activity did not occur: Safety/medical concerns  Chair/bed transfer assist level: Dependent - mechanical lift     Locomotion Ambulation   Ambulation assist   Ambulation  activity did not occur: Safety/medical concerns          Walk 10 feet activity   Assist  Walk 10 feet activity did not occur: Safety/medical concerns        Walk 50 feet activity   Assist Walk 50 feet with 2 turns activity did not occur: Safety/medical concerns         Walk 150 feet activity   Assist Walk 150 feet activity did not occur: Safety/medical concerns         Walk 10 feet on uneven surface  activity   Assist Walk 10 feet on uneven surfaces activity did not occur: Safety/medical concerns          Wheelchair     Assist Will patient use wheelchair at discharge?: (TBD)             Wheelchair 50 feet with 2 turns activity    Assist            Wheelchair 150 feet activity     Assist          Blood pressure 125/83, pulse (!) 106, temperature 98.6 F (37 C), temperature source Oral, resp. rate 20, height 5\' 9"  (1.753 m), weight 123.4 kg, last menstrual period 06/17/2012, SpO2 98 %.   Medical Problem List and Plan: 1.  Decreased functional mobility secondary to CIM due to acute hypoxemic respiratory failure due to ARDS/COVID-19 pneumonia.  Extubated 01/31/2019             -patient may shower             -ELOS/Goals: 27-32 days/Mod A            Continue CIR therapies.    -satting well on room air, changed O2 to PRN. 2.  Antithrombotics: -DVT/anticoagulation: Lovenox.  Venous Doppler studies negative             -antiplatelet therapy: Aspirin 81 mg daily 3. Pain Management: Tylenol as needed 4. Mood: Provide emotional support             -antipsychotic agents: N/A 5. Neuropsych: This patient is capable of making decisions on her own behalf. 6. Skin/Wound Care: Routine skin checks 7. Fluids/Electrolytes/Nutrition: Routine in and outs.  CMP ordered for 2/1 8.  Acute on chronic diastolic congestive heart failure.  Echocardiogram ejection fraction 50%.  Monitor for any signs of fluid overload.  Continue Demadex 20 mg twice daily             Daily weights. 9.  Diabetes mellitus with peripheral neuropathy.  Hemoglobin A1c 7.1.  NovoLog 15 units 3 times daily with meals, Lantus insulin 25 units twice daily.  Check blood sugars before meals and at bedtime.  Diabetic teaching  1/30: CBG most recent reads: 231, 265, 166, 289- uncontrolled. Will increase Lantus to 26U BID.   1/31: Glucose in 300s today. Will increase Lantus to 27U BID.              Monitor with increased mobility 10.  Cardiogenic shock.  Wean from pressors.  Monitor for orthostasis.  Continue Coreg  12.5 mg twice daily 11.  Super obesity.  BMI 54.50.  Dietary follow-up.  Encourage weight loss 12.  Hyperlipidemia.  Crestor 13.  History of anemia.  Continue iron supplement.  Latest hemoglobin 11.6.             CBC ordered for 2/1 14.  Sickle cell trait.  Follow-up outpatient.     LOS: 2 days A  FACE TO FACE EVALUATION WAS PERFORMED  Izora Ribas 02/11/2019, 2:48 PM

## 2019-02-11 NOTE — Progress Notes (Signed)
Patient tolerated weaning down to 1 L and then off O2. Sats remained in mid-high 90s. Reported to oncoming nurse.

## 2019-02-12 ENCOUNTER — Inpatient Hospital Stay (HOSPITAL_COMMUNITY): Payer: Medicaid Other

## 2019-02-12 ENCOUNTER — Inpatient Hospital Stay (HOSPITAL_COMMUNITY): Payer: Medicaid Other | Admitting: Physical Therapy

## 2019-02-12 LAB — COMPREHENSIVE METABOLIC PANEL
ALT: 27 U/L (ref 0–44)
AST: 21 U/L (ref 15–41)
Albumin: 3.3 g/dL — ABNORMAL LOW (ref 3.5–5.0)
Alkaline Phosphatase: 65 U/L (ref 38–126)
Anion gap: 17 — ABNORMAL HIGH (ref 5–15)
BUN: 20 mg/dL (ref 6–20)
CO2: 29 mmol/L (ref 22–32)
Calcium: 10.1 mg/dL (ref 8.9–10.3)
Chloride: 90 mmol/L — ABNORMAL LOW (ref 98–111)
Creatinine, Ser: 0.97 mg/dL (ref 0.44–1.00)
GFR calc Af Amer: >60 mL/min (ref >60)
GFR calc non Af Amer: >60 mL/min (ref >60)
Glucose, Bld: 256 mg/dL — ABNORMAL HIGH (ref 70–99)
Potassium: 3.1 mmol/L — ABNORMAL LOW (ref 3.5–5.1)
Sodium: 136 mmol/L (ref 135–145)
Total Bilirubin: 1.2 mg/dL (ref 0.3–1.2)
Total Protein: 6.2 g/dL — ABNORMAL LOW (ref 6.5–8.1)

## 2019-02-12 LAB — GLUCOSE, CAPILLARY
Glucose-Capillary: 258 mg/dL — ABNORMAL HIGH (ref 70–99)
Glucose-Capillary: 319 mg/dL — ABNORMAL HIGH (ref 70–99)
Glucose-Capillary: 232 mg/dL — ABNORMAL HIGH (ref 70–99)
Glucose-Capillary: 188 mg/dL — ABNORMAL HIGH (ref 70–99)

## 2019-02-12 LAB — CBC WITH DIFFERENTIAL/PLATELET
Abs Immature Granulocytes: 0.03 10*3/uL (ref 0.00–0.07)
Basophils Absolute: 0 10*3/uL (ref 0.0–0.1)
Basophils Relative: 0 %
Eosinophils Absolute: 0 10*3/uL (ref 0.0–0.5)
Eosinophils Relative: 0 %
HCT: 41.4 % (ref 36.0–46.0)
Hemoglobin: 13.7 g/dL (ref 12.0–15.0)
Immature Granulocytes: 0 %
Lymphocytes Relative: 45 %
Lymphs Abs: 3.9 10*3/uL (ref 0.7–4.0)
MCH: 26.8 pg (ref 26.0–34.0)
MCHC: 33.1 g/dL (ref 30.0–36.0)
MCV: 81 fL (ref 80.0–100.0)
Monocytes Absolute: 0.9 10*3/uL (ref 0.1–1.0)
Monocytes Relative: 10 %
Neutro Abs: 4 10*3/uL (ref 1.7–7.7)
Neutrophils Relative %: 45 %
Platelets: 506 10*3/uL — ABNORMAL HIGH (ref 150–400)
RBC: 5.11 MIL/uL (ref 3.87–5.11)
RDW: 18.9 % — ABNORMAL HIGH (ref 11.5–15.5)
WBC: 8.8 10*3/uL (ref 4.0–10.5)
nRBC: 0 % (ref 0.0–0.2)

## 2019-02-12 MED ORDER — ENOXAPARIN SODIUM 80 MG/0.8ML ~~LOC~~ SOLN
0.5000 mg/kg | Freq: Every day | SUBCUTANEOUS | Status: DC
  Administered 2019-02-13 – 2019-03-10 (×26): 75 mg via SUBCUTANEOUS
  Filled 2019-02-12 (×28): qty 0.75

## 2019-02-12 MED ORDER — POTASSIUM CHLORIDE CRYS ER 20 MEQ PO TBCR
40.0000 meq | EXTENDED_RELEASE_TABLET | ORAL | Status: AC
  Administered 2019-02-12 (×2): 40 meq via ORAL
  Filled 2019-02-12 (×2): qty 2

## 2019-02-12 NOTE — Progress Notes (Signed)
Social Work Assessment and Plan   Patient Details  Name: Kaitlin [name] MRN: [id number] Date of Birth: 1965/06/29  Today's Date: 02/12/2019  Problem List:  Patient Active Problem List   Diagnosis Date Noted  . 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 COVID-19   . History of anemia   . Super obese   . Poorly controlled type 2 diabetes mellitus with peripheral neuropathy ([place])   . Acute on chronic diastolic (congestive) heart failure ([place])   . Hypernatremia   . Shock circulatory ([place])   . Septic shock ([place])   . Hypotension 01/13/2019  . Acute respiratory failure with hypoxemia ([place]) 01/13/2019  . Acute respiratory distress syndrome (ARDS) due to COVID-19 virus ([place]) 01/12/2019  . Grade I diastolic dysfunction [id number]  . 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 Dyspnea on exertion 05/19/2016  . Other fatigue 05/19/2016  . Absolute anemia 04/15/2016  . Left leg weakness 10/18/2014  . Left leg paresthesias 10/18/2014  . Acute left-sided weakness 10/18/2014  . Encounter for central line placement 03/08/2014  . Taking drug for chronic disease 03/08/2014  . H/O insertion of insulin pump 02/18/2014  . Long term current use of insulin ([place]) 02/18/2014  . Insulin pump in place 02/18/2014  . 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 Abnormal ballistocardiogram 05/18/2013  . Abnormal nuclear stress test 05/18/2013  . 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 NICM (nonischemic cardiomyopathy) ([place]) 05/01/2013  . Insulin dependent type 2 diabetes mellitus ([place]) 04/10/2013  . Sex counseling 04/10/2013  . Edema leg 04/10/2013  . Upper respiratory infection, viral 04/10/2013  . Pedal edema 03/29/2013  . Pneumonia 12/12/2012  . Essential hypertension 12/12/2012  . HLD (hyperlipidemia) 12/12/2012  . Tonsillar mass 12/01/2012  . 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 Leukocytosis   . Anemia   . High cholesterol   . Hypertension   . Blurred vision 09/11/2011  . Foot  pain 09/11/2011  . Breast pain 09/11/2011  . Morbid obesity ([place]) 09/11/2011  . Fungal infection of nail 09/11/2011  . Avitaminosis D 09/11/2011   Past Medical History:  Past Medical History:  Diagnosis Date  . Anemia   . Bronchitis   . Cataracts, bilateral 01/12/2018  . CHF (congestive heart failure) ([place])   . Diabetes mellitus   . High cholesterol   . Hypertension    takes medicine to protect kidneys, does not have HTN  . Leukocytosis   . Morbid obesity ([place]) 09/11/2011   BMI 57  . Neuropathy 01/12/2018  . NICM (nonischemic cardiomyopathy) ([place]) 05/01/2013   Overview:  Last Assessment & Plan:  euvolemic on physical exam.   . Obesity   . Sickle cell trait ([place])   . Thymoma    Past Surgical History:  Past Surgical History:  Procedure Laterality Date  . COLONOSCOPY N/A 06/21/2017   Procedure: COLONOSCOPY;  Surgeon: Ronnette Juniper, MD;  Location: [facility];  Service: Gastroenterology;  Laterality: N/A;  . LEFT HEART CATHETERIZATION WITH CORONARY ANGIOGRAM N/A 05/25/2013   Procedure: LEFT HEART CATHETERIZATION WITH CORONARY ANGIOGRAM;  Surgeon: Troy Sine, MD;  Location: [facility];  Service: Cardiovascular;  Laterality: N/A;  . LYMPH NODE BIOPSY Right 11/30/2012   Procedure: RIGHT TONSIL BIOPSY WITH FRESH FROZEN ANALYSIS;  Surgeon: Jodi Marble, MD;  Location: [facility];  Service: ENT;  Laterality: Right;  . MEDIASTERNOTOMY N/A 01/12/2013   Procedure: MEDIAN STERNOTOMY;  Surgeon: Gaye Pollack, MD;  Location: [facility];  Service: Thoracic;  Laterality: N/A;  .  MEDIASTINOTOMY CHAMBERLAIN MCNEIL Right 11/06/2012   Procedure: MEDIASTINOTOMY CHAMBERLAIN MCNEILPROCEDURE;  Surgeon: Gaye Pollack, MD;  Location: [facility];  Service: Thoracic;  Laterality: Right;  . POLYPECTOMY  06/21/2017   Procedure: POLYPECTOMY;  Surgeon: Ronnette Juniper, MD;  Location: [facility];  Service: Gastroenterology;;  . RESECTION OF A THYMOMA N/A 01/12/2013   Procedure: RESECTION OF A THYMOMA;  Surgeon: Gaye Pollack, MD;  Location: [facility];  Service: Thoracic;  Laterality: N/A;  . TONSILLECTOMY    . TUBAL LIGATION     Social History:  reports that she is a non-smoker but has been exposed to tobacco smoke. She has never used smokeless tobacco. She reports that she does not drink alcohol or use drugs.  Family / Support Systems Marital Status: Single Patient Roles: Caregiver, Other (Comment)(caregiver for grandchildren) Children: daughter, Terisa Starr (pt lives with her) @ [phone];  daughter, [name] @ [phone] and son, Elijah Anticipated Caregiver: Deeann Saint, and Marval Regal Ability/Limitations of Caregiver: Mod A, aware hoyer lift may be needed; wc level expected Caregiver Availability: 24/7 Family Dynamics: Pt describes all children as very supportive.  No other local family.  Social History Preferred language: English Religion: Apostolic Cultural Background: NA Read: Yes Write: Yes Employment Status: Disabled Date Retired/Disabled/Unemployed: ~ 1 yr due to CHF Legal History/Current Legal Issues: none Guardian/Conservator: none - per MD, pt is capable of making decisions on her own behalf.   Abuse/Neglect Abuse/Neglect Assessment Can Be Completed: Yes Physical Abuse: Denies Verbal Abuse: Denies Sexual Abuse: Denies Exploitation of patient/patient's resources: Denies Self-Neglect: Denies  Emotional Status Pt's affect, behavior and adjustment status: Pt laying in bed and completes assessment interview without much difficulty.  She does report fatigue from morning therapies.  She is hopeful to regain some mobility and independence with CIR.  She denies any significant emotional distress, however, may benefit from neuropsychology consult due to extent of illness/ debility/ lengthy hospitalization. Recent Psychosocial Issues: None Psychiatric History: none Substance Abuse History: none  Patient / Family Perceptions, Expectations & Goals Pt/Family understanding of illness &  functional limitations: Pt with good, general understanding of the severity of her debility following COVID illness and need for CIR therapies. Premorbid pt/family roles/activities: Pt was independent overall Anticipated changes in roles/activities/participation: Per goals of mod assist/ wheelchair level, daughters and son to provide all caregiver assistance. Pt/family expectations/goals: "I just want to be able to do something for myself again."  [employer]: None Premorbid Home Care/DME Agencies: None Transportation available at discharge: yes Resource referrals recommended: Neuropsychology  Discharge Planning Living Arrangements: Children Support Systems: Children Type of Residence: Private residence Insurance Resources: [employer] (specify county) Pensions consultant: SSI, SSD Financial Screen Referred: No Money Management: Patient, Family Does the patient have any problems obtaining your medications?: No Home Management: pt and family Patient/Family Preliminary Plans: Pt to d/c to her daughter's home which had a level entry.  All children to share in providing 24/7 assistance. Social Work Anticipated Follow Up Needs: HH/OP Expected length of stay: 4 weeks  Clinical Impression Pleasant woman here with significant debility following COVID illness.  Pt and family aware that expecting goals for mod assist wheelchair level and 24/7 assistance.  3 adult children to share in providing all needed support.  Pt completes assessment interview without difficulty and mood appears stable.  May benefit from neuropsychology referral while here for additional support.  Kathryn Linarez 02/12/2019, 4:22 PM

## 2019-02-12 NOTE — Care Management (Signed)
[facility] Individual Statement of Services  Patient Name:  Kaitlin [name]  Date:  02/12/2019  Welcome to the [facility].  Our goal is to provide you with an individualized program based on your diagnosis and situation, designed to meet your specific needs.  With this comprehensive rehabilitation program, you will be expected to participate in at least 3 hours of rehabilitation therapies Monday-Friday, with modified therapy programming on the weekends.  Your rehabilitation program will include the following services:  Physical Therapy (PT), Occupational Therapy (OT), Speech Therapy (ST), 24 hour per day rehabilitation nursing, Therapeutic Recreaction (TR), Neuropsychology, Case Management (Social Worker), Rehabilitation Medicine, Nutrition Services and Pharmacy Services  Weekly team conferences will be held on Tuesdays to discuss your progress.  Your Social Worker will talk with you frequently to get your input and to update you on team discussions.  Team conferences with you and your family in attendance may also be held.  Expected length of stay: 4 weeks   Overall anticipated outcome: mod assist @ wheelchair level  Depending on your progress and recovery, your program may change. Your Social Worker will coordinate services and will keep you informed of any changes. Your Social Worker's name and contact numbers are listed  below.  The following services may also be recommended but are not provided by the [facility] will be made to provide these services after discharge if needed.  Arrangements include referral to agencies that provide these services.  Your insurance has been verified to be:  Medicaid Your primary doctor is:  [facility]  Pertinent information will be shared with your doctor and your insurance  company.  Social Worker:  [place], [place] or ([phone]   Information discussed with and copy given to patient by: Lennart Pall, 02/12/2019, 4:24 PM

## 2019-02-12 NOTE — Progress Notes (Signed)
Physical Therapy Session Note  Patient Details  Name: Kaitlin [name] MRN: [id number] Date of Birth: 07/18/1965  Today's Date: 02/12/2019 PT Individual Time: 0800-0900; 1300-1340 PT Individual Time Calculation (min): 60 min and 40 min PT Missed Time: 20 min Missed Time Reason: patient fatigue  Short Term Goals: Week 1:  PT Short Term Goal 1 (Week 1): Pt will perform R/L rolling in bed with +2 mod assist PT Short Term Goal 2 (Week 1): Pt will perform supine<>sit with +2 mod assist PT Short Term Goal 3 (Week 1): Pt will initiate sit>stand using lift equipment as needed PT Short Term Goal 4 (Week 1): Pt will tolerate sitting OOB in w/c for at least 30 minutes  Skilled Therapeutic Interventions/Progress Updates:    Session 1: Pt received seated in bed receiving AM medications from RN. Pt reports some pain in R arm and in abdomen whenever reaching overhead to scoot up in bed, not rated. RN and medical team aware of pain. No other complaints of pain during therapy session. Pt reports urge to urinate. Rolling L/R with assist x 2 for placement of bedpan, pt able to continently void on bedpan. Pt is dependent for pericare and donning of new brief. Rolling L/R with assist x 2 for placement of maxi move sling. Pt initially on 2L O2 via nasal cannula while in bed, SpO2 93% and pt not on O2 during the day yesterday. Removed O2 and SpO2 remains at 90% and above at rest. Maxi move transfer bed to w/c. Once seated in w/c pt reports feeling dizzy and fatigued. Seated BP 104/65, HR 124, SpO2 90% on room air. Demonstrated pursed lip breathing techniques. Pt reports no change in symptoms of dizziness or fatigue after seated rest break but wants to remain seated up in chair. Maxi move transfer w/c to recliner for improved comfort and safety. Pt left seated in recliner in room with needs in reach at end of session.  Session 2: Pt received seated in bed having just finished lunch. Pt reports ongoing fatigue this PM and  elevated blood glucose, RN aware and pt reports she has received insulin. Pt agreeable to working on sitting EOB this session. Supine to sit with assist x 2 for BLE management and trunk control with HOB slightly elevated. Sitting balance EOB with min to mod A x 1 with BLE support on the floor. Pt tends to sway anteriorly and posteriorly and reports feeling unsteady. Pt has increase in sway with onset of fatigue as well as RUE tremors. Seated SpO2 93% and above on room air although pt does begin to exhibit increase in RR in sitting. Reviewed pursed lip breathing techniques. Pt tolerates sitting about 10 min. Sit to supine assist x 2 for trunk control and BLE management. Rolling L/R with assist x 2 for chuck pad placement. Pt is assist x 2 to scoot up in bed, able to assist minimally with RUE due to some shoulder pain, not rated. Pt left semi-reclined in bed with needs in reach at end of session. Pt falling asleep and closing eyes at end of session, missed 20 min of scheduled therapy session due to fatigue.  Therapy Documentation Precautions:  Precautions Precautions: Fall Precaution Comments: morbid obesity, monitor SpO2 and HR Restrictions Weight Bearing Restrictions: No    Therapy/Group: Individual Therapy   Excell Seltzer, PT, DPT  02/12/2019, 12:34 PM

## 2019-02-12 NOTE — Progress Notes (Signed)
Inpatient Rehabilitation  Patient information reviewed and entered into eRehab system by Heily Carlucci M. Quay Simkin, M.A., CCC/SLP, PPS Coordinator.  Information including medical coding, functional ability and quality indicators will be reviewed and updated through discharge.    

## 2019-02-12 NOTE — Progress Notes (Signed)
[facility] PHYSICAL MEDICINE & REHABILITATION PROGRESS NOTE   Subjective/Complaints:  Pt reports was put back on O2 by [place] last night for overnight.  Pulled muscle in abdomen- x2 in last 2 days- doesn't know exactly how she did it- it's uncomfortable;  Prefers liquid tylenol over pills;  LBM was Friday- was diarrhea.    ROS- pt denies SOB, CP, abd pain; N/V/C/D/ vision changes, Headache. Objective:   No results found. Recent Labs    02/12/19 0615  WBC 8.8  HGB 13.7  HCT 41.4  PLT 506*   Recent Labs    02/12/19 0615  NA 136  K 3.1*  CL 90*  CO2 29  GLUCOSE 256*  BUN 20  CREATININE 0.97  CALCIUM 10.1   No intake or output data in the 24 hours ending 02/12/19 1552   Physical Exam: Vital Signs Blood pressure (!) 109/92, pulse 99, temperature 98.4 F (36.9 C), resp. rate 20, height 5\' 9"  (1.753 m), weight (!) 154.7 kg, last menstrual period 06/17/2012, SpO2 99 %. Vitals reviewed. Constitutional: She appears well-developed.  Super obesity; lying supine in bed; nursing and PT at bedside;  HENT: [place] in place. Head: Normocephalic.  Facial abrasisions esp on nose- healing but scarring lighter  Eyes: EOM are normal. Neck: No tracheal deviation present.  CV: RRR Respiratory: decreased breath sounds at bases- wearing O2 by [place]; good air movement at apex B/L; no W/R/R.  GI: Soft. She exhibits no distension.  Musculoskeletal:     Comments: LE edema  Neurological: She is alert.  Motor: RUE: shoulder abduction 2+/5, elbow flexion/extension 3-/5, hand grip 4-/5 LUE: shoulder abduction 2/5, elbow flexion/extension 2+/5, hand grip 3/5 B/l LE: HF, KE 3+/5, ADF 4-/5 Sensation intact to light touch  Skin: Skin is warm and dry.  Sacral ulcer not examined See above  Psychiatric: slightly flat affect    Assessment/Plan: 1. Functional deficits secondary to critical illness myopathy which require 3+ hours per day of interdisciplinary therapy in a comprehensive inpatient rehab  setting.  Physiatrist is providing close team supervision and 24 hour management of active medical problems listed below.  Physiatrist and rehab team continue to assess barriers to discharge/monitor patient progress toward functional and medical goals  Care Tool:  Bathing    Body parts bathed by patient: Chest, Abdomen, Face   Body parts bathed by helper: Right arm, Left arm, Chest, Abdomen, Front perineal area, Buttocks, Right upper leg, Left upper leg, Right lower leg, Left lower leg     Bathing assist Assist Level: 2 Helpers     Upper Body Dressing/Undressing Upper body dressing   What is the patient wearing?: Hospital gown only    Upper body assist Assist Level: Total Assistance - Patient < 25%    Lower Body Dressing/Undressing Lower body dressing      What is the patient wearing?: Incontinence brief     Lower body assist Assist for lower body dressing: 2 Helpers     Toileting Toileting    Toileting assist Assist for toileting: 2 Helpers     Transfers Chair/bed transfer  Transfers assist  Chair/bed transfer activity did not occur: Safety/medical concerns  Chair/bed transfer assist level: Dependent - mechanical lift     Locomotion Ambulation   Ambulation assist   Ambulation activity did not occur: Safety/medical concerns          Walk 10 feet activity   Assist  Walk 10 feet activity did not occur: Safety/medical concerns  Walk 50 feet activity   Assist Walk 50 feet with 2 turns activity did not occur: Safety/medical concerns         Walk 150 feet activity   Assist Walk 150 feet activity did not occur: Safety/medical concerns         Walk 10 feet on uneven surface  activity   Assist Walk 10 feet on uneven surfaces activity did not occur: Safety/medical concerns         Wheelchair     Assist Will patient use wheelchair at discharge?: Yes(TBD) Type of Wheelchair: Manual(Per chart review LT goals set for  dischrage )           Wheelchair 50 feet with 2 turns activity    Assist    Wheelchair 50 feet with 2 turns activity did not occur: Safety/medical concerns       Wheelchair 150 feet activity     Assist  Wheelchair 150 feet activity did not occur: Safety/medical concerns       Blood pressure (!) 109/92, pulse 99, temperature 98.4 F (36.9 C), resp. rate 20, height 5\' 9"  (1.753 m), weight (!) 154.7 kg, last menstrual period 06/17/2012, SpO2 99 %.   Medical Problem List and Plan: 1.  Decreased functional mobility secondary to CIM due to acute hypoxemic respiratory failure due to ARDS/COVID-19 pneumonia.  Extubated 01/31/2019             -patient may shower             -ELOS/Goals: 27-32 days/Mod A            Continue CIR therapies.    -satting well on room air, changed O2 to PRN. 2.  Antithrombotics: -DVT/anticoagulation: Lovenox.  Venous Doppler studies negative             -antiplatelet therapy: Aspirin 81 mg daily 3. Pain Management: Tylenol as needed 4. Mood: Provide emotional support             -antipsychotic agents: N/A 5. Neuropsych: This patient is capable of making decisions on her own behalf. 6. Skin/Wound Care: Routine skin checks 7. Fluids/Electrolytes/Nutrition: Routine in and outs.  CMP ordered for 2/1  2/1- Hypokalemia- 3.1- will replete and recheck Wednesday 8.  Acute on chronic diastolic congestive heart failure.  Echocardiogram ejection fraction 50%.  Monitor for any signs of fluid overload.  Continue Demadex 20 mg twice daily             Daily weights.   Filed Weights   02/11/19 0500 02/12/19 0500 02/12/19 1000  Weight: 123.4 kg 123 kg (!) 154.7 kg    2/1- weight changed >30 lbs- will check into  9.  Diabetes mellitus with peripheral neuropathy.  Hemoglobin A1c 7.1.  NovoLog 15 units 3 times daily with meals, Lantus insulin 25 units twice daily.  Check blood sugars before meals and at bedtime.  Diabetic teaching  1/30: CBG most recent reads:  231, 265, 166, 289- uncontrolled. Will increase Lantus to 26U BID.   1/31: Glucose in 300s today. Will increase Lantus to 27U BID.    CBG (last 3)  Recent Labs    02/11/19 2013 02/12/19 0612 02/12/19 1141  GLUCAP 143* 258* 319*    2/1- just got Lantus increased yesterday- will look at trend in AM and titrate Lantus             Monitor with increased mobility 10.  Cardiogenic shock.  Wean from pressors.  Monitor for orthostasis.  Continue Coreg  12.5 mg twice daily 11.  Super obesity.  BMI 54.50.  Dietary follow-up.  Encourage weight loss 12.  Hyperlipidemia.  Crestor 13.  History of anemia.  Continue iron supplement.  Latest hemoglobin 11.6.             CBC ordered for 2/1  2/1- Hb 13.7 14.  Sickle cell trait.  Follow-up outpatient.     LOS: 3 days A FACE TO FACE EVALUATION WAS PERFORMED  Zariel Capano 02/12/2019, 3:52 PM

## 2019-02-12 NOTE — IPOC Note (Signed)
Overall Plan of Care [facility]) Patient Details Name: Kaitlin [name] MRN: [id number] DOB: October 29, 1965  Admitting Diagnosis: Critical illness myopathy  Hospital Problems: Principal Problem:   Critical illness myopathy Active Problems:   COVID-19     Functional Problem List: Nursing Bowel, Bladder, Endurance, Safety, Skin Integrity  PT Balance, Safety, Edema, Sensory, Skin Integrity, Endurance, Motor, Nutrition, Pain  OT Balance, Endurance, Motor, Skin Integrity  SLP    TR         Basic ADL's: OT Eating, Grooming, Bathing, Dressing, Toileting     Advanced  ADL's: OT       Transfers: PT Bed Mobility, Bed to Chair, Car, Manufacturing systems engineer, Metallurgist: PT Ambulation, Emergency planning/management officer, Stairs     Additional Impairments: OT Fuctional Use of Upper Extremity  SLP        TR      Anticipated Outcomes Item Anticipated Outcome  Self Feeding independent  Swallowing      Basic self-care  min/mod A  Toileting  min A   Bathroom Transfers min A  Bowel/Bladder  mod assist, cont x 2  Transfers  mod assist  Locomotion  TBD if pt will be a functional ambulator  Communication     Cognition     Pain  less than 3 on 0-10 scale  Safety/Judgment  mod assist   Therapy Plan: PT Intensity: Minimum of 1-2 x/day ,45 to 90 minutes PT Frequency: 5 out of 7 days PT Duration Estimated Length of Stay: ~4 weeks OT Intensity: Minimum of 1-2 x/day, 45 to 90 minutes OT Frequency: 5 out of 7 days OT Duration/Estimated Length of Stay: 4 weeks     Due to the current state of emergency, patients may not be receiving their 3-hours of Medicare-mandated therapy.   Team Interventions: Nursing Interventions Patient/Family Education, Bladder Management, Bowel Management, Disease Management/Prevention, Skin Care/Wound Management, Discharge Planning  PT interventions Ambulation/gait training, DME/adaptive equipment instruction, Neuromuscular re-education, Psychosocial  support, UE/LE Strength taining/ROM, Stair training, Wheelchair propulsion/positioning, Training and development officer, Discharge planning, Functional electrical stimulation, Pain management, Skin care/wound management, Therapeutic Activities, UE/LE Coordination activities, Cognitive remediation/compensation, Disease management/prevention, Functional mobility training, Patient/family education, Splinting/orthotics, Therapeutic Exercise, Visual/perceptual remediation/compensation  OT Interventions Balance/vestibular training, Self Care/advanced ADL retraining, Therapeutic Exercise, Wheelchair propulsion/positioning, DME/adaptive equipment instruction, Skin care/wound managment, UE/LE Strength taining/ROM, Community reintegration, Barrister's clerk education, UE/LE Coordination activities, Discharge planning, Functional mobility training, Therapeutic Activities  SLP Interventions    TR Interventions    SW/CM Interventions Discharge Planning, Psychosocial Support, Patient/Family Education   Barriers to Discharge MD  Medical stability, Home enviroment access/loayout, Wound care, Lack of/limited family support, Weight, New oxygen and critical illness myopathy  Nursing Incontinence, Weight    PT Decreased caregiver support, Incontinence, Lack of/limited family support, Weight    OT Weight    SLP      SW       Team Discharge Planning: Destination: PT-Home ,OT- Home , SLP-  Projected Follow-up: PT-Home health PT, 24 hour supervision/assistance, OT-  Home health OT, SLP-  Projected Equipment Needs: PT-To be determined, OT- 3 in 1 bedside comode, Tub/shower bench, SLP-  Equipment Details: PT- , OT-  Patient/family involved in discharge planning: PT- Patient,  OT-Patient, SLP-   MD ELOS: ~ 4 weeks Medical Rehab Prognosis:  Good Assessment: Patient is a 54 yr old female with SEVERE critical illness myopathy due to COVID and pneumonia and ARDS With sacral wound- need to assess; DMII on insulin, cardiogenic  shock, and acute  on chronic diastolic CHF.  Goals- min-mod assist due to dx.  See Team Conference Notes for weekly updates to the plan of care

## 2019-02-12 NOTE — Progress Notes (Addendum)
Occupational Therapy Session Note  Patient Details  Name: Kaitlin [name] MRN: [id number] Date of Birth: 10/28/1965  Today's Date: 02/12/2019 OT Individual Time: 1000-1100 OT Individual Time Calculation (min): 60 min    Short Term Goals: Week 1:  OT Short Term Goal 1 (Week 1): patient will complete rolling in bed with min A of one OT Short Term Goal 2 (Week 1): patient will complete sit to/from supine with mod A of one OT Short Term Goal 3 (Week 1): patient will tolerate sitting in w/c or recliner 1-2 hours per day OT Short Term Goal 4 (Week 1): patient will complete UB bathing and dressing with mod A  Skilled Therapeutic Interventions/Progress Updates:    Pt resting in recliner upon arrival.  Pt stated she wasn't "feeling well" today.  BP 111/62, O2 97 on RA, HR 100. Pt initially engaged in [place] with pain noted. Pt stated she wanted to return to bed because her bottom and back were hurting.  Pt returned to bed with MaxiMove. NT assisted. Continued with BUE AAROM/PROM. Pt stated back pain relieved when in bed.  Pt remained in bed with all needs within reach and bed alarm activated.   Therapy Documentation Precautions:  Precautions Precautions: Fall Precaution Comments: morbid obesity, monitor SpO2 and HR Restrictions Weight Bearing Restrictions: No Pain:  Pt c/o general discomfort this morning; pt also c/o BUE pain stating "they get that way sometimes"   Therapy/Group: Individual Therapy  Leroy Libman 02/12/2019, 12:03 PM

## 2019-02-13 ENCOUNTER — Inpatient Hospital Stay (HOSPITAL_COMMUNITY): Payer: Medicaid Other | Admitting: Physical Therapy

## 2019-02-13 ENCOUNTER — Inpatient Hospital Stay (HOSPITAL_COMMUNITY): Payer: Medicaid Other

## 2019-02-13 LAB — GLUCOSE, CAPILLARY
Glucose-Capillary: 267 mg/dL — ABNORMAL HIGH (ref 70–99)
Glucose-Capillary: 287 mg/dL — ABNORMAL HIGH (ref 70–99)
Glucose-Capillary: 299 mg/dL — ABNORMAL HIGH (ref 70–99)
Glucose-Capillary: 240 mg/dL — ABNORMAL HIGH (ref 70–99)

## 2019-02-13 MED ORDER — BISACODYL 10 MG RE SUPP
10.0000 mg | Freq: Every day | RECTAL | Status: DC | PRN
  Administered 2019-02-14: 10 mg via RECTAL
  Filled 2019-02-13: qty 1

## 2019-02-13 MED ORDER — INSULIN GLARGINE 100 UNIT/ML ~~LOC~~ SOLN
30.0000 [IU] | Freq: Two times a day (BID) | SUBCUTANEOUS | Status: DC
  Administered 2019-02-13 – 2019-02-14 (×2): 30 [IU] via SUBCUTANEOUS
  Filled 2019-02-13 (×4): qty 0.3

## 2019-02-13 MED ORDER — MAGNESIUM CITRATE PO SOLN
1.0000 | Freq: Once | ORAL | Status: AC
  Administered 2019-02-13: 16:00:00 1 via ORAL
  Filled 2019-02-13: qty 296

## 2019-02-13 NOTE — Progress Notes (Signed)
Physical Therapy Session Note  Patient Details  Name: Kaitlin [name] MRN: [id number] Date of Birth: 08/06/1965  Today's Date: 02/13/2019 PT Individual Time: 1420-1530 PT Individual Time Calculation (min): 70 min   Short Term Goals: Week 1:  PT Short Term Goal 1 (Week 1): Pt will perform R/L rolling in bed with +2 mod assist PT Short Term Goal 2 (Week 1): Pt will perform supine<>sit with +2 mod assist PT Short Term Goal 3 (Week 1): Pt will initiate sit>stand using lift equipment as needed PT Short Term Goal 4 (Week 1): Pt will tolerate sitting OOB in w/c for at least 30 minutes  Skilled Therapeutic Interventions/Progress Updates:    Pt received supine in bed and agreeable to therapy session. Received and maintained on room air throughout session - no significant SOB or labored breathing noted with activity. Supine>R sidelying>sitting EOB with +2 mod assist for BLE management and trunk upright - cuing for sequencing. Sitting EOB for ~7 minutes with close supervision for trunk control - pt demonstrating improved upright posture compared to at eval. Performed L UE active assisted elbow flexion in sitting x10 reps. Pt agreeable to attempt standing this session. Sit>supine with +2 mod assist for BLE management and trunk descent. Therapist retreived maxisky bari-walking sling and bari-RW. Supine>R sidelying>sitting EOB as described above. Started to don the bari-walking sling and noted it would not fit around pt's waist - therapist plan to retrieve gait belt to extend the strap. Performed seated B LE ankle PF x10 reps each LE and then R LE seated heel slides x8 reps. Pt then reporting need to have BM. Sit>supine with +2 mod assist for B LE management and trunk descent. Supine scooting towards HOB with +2 total assist with bed in trendelenburg. Rolling L with max assist while 2nd person place bed pan. Pt unable to void. Rolling R/L with max assist while therapist performed total assist peri-care for  cleanliness and doffing bedpan. Pt left supine in bed with needs in reach.    Therapy Documentation Precautions:  Precautions Precautions: Fall Precaution Comments: morbid obesity, monitor SpO2 and HR Restrictions Weight Bearing Restrictions: No  Pain:   At end of session states no pain and reports "feeling good."   Therapy/Group: Individual Therapy  [name] [name],PT,DPT 02/13/2019, 3:11 PM

## 2019-02-13 NOTE — Progress Notes (Addendum)
[place] PHYSICAL MEDICINE & REHABILITATION PROGRESS NOTE   Subjective/Complaints:  Pt reports LBM was Friday- really having abd pain/discomfort.  Like a big ball in her stomach that keeps moving back and forth, per pt.   Overall, though, "better than yesterday".   Reticent to do mg citrate- scared it will cause incontinence. Assured her that's OK if have to, but will feel better.    ROS- pt denies SOB, CP, abd pain; N/V/C/D/ vision changes, Headache. Objective:   No results found. Recent Labs    02/12/19 0615  WBC 8.8  HGB 13.7  HCT 41.4  PLT 506*   Recent Labs    02/12/19 0615  NA 136  K 3.1*  CL 90*  CO2 29  GLUCOSE 256*  BUN 20  CREATININE 0.97  CALCIUM 10.1    Intake/Output Summary (Last 24 hours) at 02/13/2019 0951 Last data filed at 02/12/2019 2158 Gross per 24 hour  Intake 240 ml  Output --  Net 240 ml     Physical Exam: Vital Signs Blood pressure 98/82, pulse 95, temperature 98.3 F (36.8 C), temperature source Oral, resp. rate 18, height 5\' 9"  (1.753 m), weight (!) 154.7 kg, last menstrual period 06/17/2012, SpO2 98 %. Vitals reviewed. Constitutional: She appears well-developed.  Super obesity; lying supine in bed;  and PT at bedside; assured her about needing bedpan, is OK to have BM, NAD HENT:  in place. Head: Normocephalic.  Facial abrasisions esp on nose- healing but scarring lighter than expected  Eyes: EOM are normal. Neck: No tracheal deviation present.  CV: RRR Respiratory: decreased breath sounds at bases- not wearing O2 this AM GI: Soft. Slightly TTP- no rebound; distended? Vs protuberant; hypoactive BS  Musculoskeletal:     Comments: LE edema  Neurological: She is alert.  Motor: RUE: shoulder abduction 2+/5, elbow flexion/extension 3-/5, hand grip 4-/5 LUE: shoulder abduction 2/5, elbow flexion/extension 2+/5, hand grip 3/5 B/l LE: HF, KE 3+/5, ADF 4-/5 Sensation intact to light touch  Skin: Skin is warm and dry.  Sacral ulcer  not examined See above  Psychiatric: slightly flat affect    Assessment/Plan: 1. Functional deficits secondary to critical illness myopathy which require 3+ hours per day of interdisciplinary therapy in a comprehensive inpatient rehab setting.  Physiatrist is providing close team supervision and 24 hour management of active medical problems listed below.  Physiatrist and rehab team continue to assess barriers to discharge/monitor patient progress toward functional and medical goals  Care Tool:  Bathing    Body parts bathed by patient: Chest, Abdomen, Face   Body parts bathed by helper: Right arm, Left arm, Chest, Abdomen, Front perineal area, Buttocks, Right upper leg, Left upper leg, Right lower leg, Left lower leg     Bathing assist Assist Level: 2 Helpers     Upper Body Dressing/Undressing Upper body dressing   What is the patient wearing?: Hospital gown only    Upper body assist Assist Level: Total Assistance - Patient < 25%    Lower Body Dressing/Undressing Lower body dressing      What is the patient wearing?: Incontinence brief     Lower body assist Assist for lower body dressing: 2 Helpers     Toileting Toileting    Toileting assist Assist for toileting: 2 Helpers     Transfers Chair/bed transfer  Transfers assist  Chair/bed transfer activity did not occur: Safety/medical concerns  Chair/bed transfer assist level: Dependent - mechanical lift     Locomotion Ambulation   Ambulation  assist   Ambulation activity did not occur: Safety/medical concerns          Walk 10 feet activity   Assist  Walk 10 feet activity did not occur: Safety/medical concerns        Walk 50 feet activity   Assist Walk 50 feet with 2 turns activity did not occur: Safety/medical concerns         Walk 150 feet activity   Assist Walk 150 feet activity did not occur: Safety/medical concerns         Walk 10 feet on uneven surface   activity   Assist Walk 10 feet on uneven surfaces activity did not occur: Safety/medical concerns         Wheelchair     Assist Will patient use wheelchair at discharge?: Yes(TBD) Type of Wheelchair: Manual(Per chart review LT goals set for dischrage )           Wheelchair 50 feet with 2 turns activity    Assist    Wheelchair 50 feet with 2 turns activity did not occur: Safety/medical concerns       Wheelchair 150 feet activity     Assist  Wheelchair 150 feet activity did not occur: Safety/medical concerns       Blood pressure 98/82, pulse 95, temperature 98.3 F (36.8 C), temperature source Oral, resp. rate 18, height 5\' 9"  (1.753 m), weight (!) 154.7 kg, last menstrual period 06/17/2012, SpO2 98 %.   Medical Problem List and Plan: 1.  Decreased functional mobility secondary to CIM due to acute hypoxemic respiratory failure due to ARDS/COVID-19 pneumonia.  Extubated 01/31/2019             -patient may shower             -ELOS/Goals: 27-32 days/Mod A            Continue CIR therapies.    -satting well on room air, changed O2 to PRN. 2.  Antithrombotics: -DVT/anticoagulation: Lovenox.  Venous Doppler studies negative             -antiplatelet therapy: Aspirin 81 mg daily 3. Pain Management: Tylenol as needed 4. Mood: Provide emotional support             -antipsychotic agents: N/A 5. Neuropsych: This patient is capable of making decisions on her own behalf. 6. Skin/Wound Care: Routine skin checks 7. Fluids/Electrolytes/Nutrition: Routine in and outs.  CMP ordered for 2/1  2/1- Hypokalemia- 3.1- will replete and recheck Wednesday 8.  Acute on chronic diastolic congestive heart failure.  Echocardiogram ejection fraction 50%.  Monitor for any signs of fluid overload.  Continue Demadex 20 mg twice daily             Daily weights.   Filed Weights   02/12/19 0500 02/12/19 1000 02/13/19 0414  Weight: 123 kg (!) 154.7 kg (!) 154.7 kg    2/1- weight  changed >30 lbs- will check into   2/2- same weight as yesterday.  9.  Diabetes mellitus with peripheral neuropathy.  Hemoglobin A1c 7.1.  NovoLog 15 units 3 times daily with meals, Lantus insulin 25 units twice daily.  Check blood sugars before meals and at bedtime.  Diabetic teaching  1/30: CBG most recent reads: 231, 265, 166, 289- uncontrolled. Will increase Lantus to 26U BID.   1/31: Glucose in 300s today. Will increase Lantus to 27U BID.    CBG (last 3)  Recent Labs    02/12/19 1650 02/12/19 2105 02/13/19 0557  GLUCAP 232* 188* 267*    2/1- just got Lantus increased yesterday- will look at trend in AM and titrate Lantus  2/2- Most BGs 200s- will increase Lantus to 30 units BID and monitor             Monitor with increased mobility 10.  Cardiogenic shock.  Wean from pressors.  Monitor for orthostasis.  Continue Coreg 12.5 mg twice daily 11.  Super obesity.  BMI 54.50.  Dietary follow-up.  Encourage weight loss 12.  Hyperlipidemia.  Crestor 13.  History of anemia.  Continue iron supplement.  Latest hemoglobin 11.6.             CBC ordered for 2/1  2/1- Hb 13.7 14.  Sickle cell trait.  Follow-up outpatient. 15. Constipation  2/2- will give Mg citrate and soap suds enema today to get pt cleaned out.      LOS: 4 days A FACE TO FACE EVALUATION WAS PERFORMED  Kaitlin [name] 02/13/2019, 9:51 AM

## 2019-02-13 NOTE — Progress Notes (Signed)
Occupational Therapy Session Note  Patient Details  Name: ZENIYA ODEGAARD MRN: [id number] Date of Birth: 10/19/1965  Today's Date: 02/13/2019 OT Individual Time: 1000-1054 OT Individual Time Calculation (min): 54 min    Short Term Goals: Week 1:  OT Short Term Goal 1 (Week 1): patient will complete rolling in bed with min A of one OT Short Term Goal 2 (Week 1): patient will complete sit to/from supine with mod A of one OT Short Term Goal 3 (Week 1): patient will tolerate sitting in w/c or recliner 1-2 hours per day OT Short Term Goal 4 (Week 1): patient will complete UB bathing and dressing with mod A  Skilled Therapeutic Interventions/Progress Updates:    Pt resting in recliner upon arrival.  OT intervention with focus on BUE AAROM/AROM to increase functional use of BUE during BADLs (see below). Pt also engaged in table tasks with focus on RUE use to retrieve items and place in a different location to encourage shoulder flexion and horizontal adduction to increase independence with self feeding. Pt tolerated activities well.  Pt became SOB with AROM exercises but O2 sats>90% on RA.  Pt remained in recliner with all needs within reach.  Therapy Documentation Precautions:  Precautions Precautions: Fall Precaution Comments: morbid obesity, monitor SpO2 and HR Restrictions Weight Bearing Restrictions: No General:   Vital Signs:   Pain:     Exercises: General Exercises - Upper Extremity Shoulder Flexion: PROM;AAROM;Both;10 reps;Seated Shoulder ABduction: PROM;AAROM;Both;10 reps;Seated Shoulder Horizontal ABduction: PROM;AAROM;Both;10 reps;Seated Other Treatments:     Therapy/Group: Individual Therapy  Leroy Libman 02/13/2019, 11:34 AM

## 2019-02-13 NOTE — Progress Notes (Signed)
Physical Therapy Session Note  Patient Details  Name: Kaitlin [name] MRN: [id number] Date of Birth: January 04, 1966  Today's Date: 02/13/2019 PT Individual Time: 0800-0900 PT Individual Time Calculation (min): 60 min   Short Term Goals: Week 1:  PT Short Term Goal 1 (Week 1): Pt will perform R/L rolling in bed with +2 mod assist PT Short Term Goal 2 (Week 1): Pt will perform supine<>sit with +2 mod assist PT Short Term Goal 3 (Week 1): Pt will initiate sit>stand using lift equipment as needed PT Short Term Goal 4 (Week 1): Pt will tolerate sitting OOB in w/c for at least 30 minutes  Skilled Therapeutic Interventions/Progress Updates:    Pt received seated in bed, agreeable to PT session. Pt reports ongoing pain in abdomen and R posterior UE. Pain is not rated and pt declines intervention. Discussion with patient and MD regarding BM and relation to abdominal pain. Pt reports urge to urinate. Rolling L/R with mod to max A for placement of bedpan. Pt is able to continently void on bedpan. Pt is dependent for pericare and donning of new brief. Pt is max to total A for UB bathing at bed level and is able to assist more with RUE than LUE due to limited shoulder ROM and strength in LUE. Pt is able to don a new gown with max A. Rolling L/R with mod to max A for placement of maxisky sling. Maxisky transfer bed to recliner. Pt initially on 2L O2 via nasal cannula with SpO2 at 92%, decreased to room air during session. SpO2 remains at 90% and above on room air throughout session. Pt left seated in recliner in room with needs in reach at end of session. Pt exhibits improved tolerance for therapy this date.  Therapy Documentation Precautions:  Precautions Precautions: Fall Precaution Comments: morbid obesity, monitor SpO2 and HR Restrictions Weight Bearing Restrictions: No    Therapy/Group: Individual Therapy   Excell Seltzer, PT, DPT  02/13/2019, 12:44 PM

## 2019-02-13 NOTE — Patient Care Conference (Signed)
Inpatient RehabilitationTeam Conference and Plan of Care Update Date: 02/13/2019   Time: 11:05 AM    Patient Name: Kaitlin [name]      Medical Record Number: [id number]  Date of Birth: 02/15/1965 Sex: Female         Room/Bed: 4W14C/4W14C-01 Payor Info: Payor: MEDICAID [place] / Plan: MEDICAID [place] ACCESS / Product Type: *No Product type* /    Admit Date/Time:  02/09/2019  2:25 PM  Primary Diagnosis:  Critical illness myopathy  Patient Active Problem List   Diagnosis Date Noted  . 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 COVID-19   . History of anemia   . Super obese   . Poorly controlled type 2 diabetes mellitus with peripheral neuropathy ([place])   . Acute on chronic diastolic (congestive) heart failure ([place])   . Hypernatremia   . Shock circulatory ([place])   . Septic shock ([place])   . Hypotension 01/13/2019  . Acute respiratory failure with hypoxemia ([place]) 01/13/2019  . Acute respiratory distress syndrome (ARDS) due to COVID-19 virus ([place]) 01/12/2019  . Grade I diastolic dysfunction [id number]  . 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 Dyspnea on exertion 05/19/2016  . Other fatigue 05/19/2016  . Absolute anemia 04/15/2016  . Left leg weakness 10/18/2014  . Left leg paresthesias 10/18/2014  . Acute left-sided weakness 10/18/2014  . Encounter for central line placement 03/08/2014  . Taking drug for chronic disease 03/08/2014  . H/O insertion of insulin pump 02/18/2014  . Long term current use of insulin ([place]) 02/18/2014  . Insulin pump in place 02/18/2014  . 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 Abnormal ballistocardiogram 05/18/2013  . Abnormal nuclear stress test 05/18/2013  . 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 NICM (nonischemic cardiomyopathy) ([place]) 05/01/2013  . Insulin dependent type 2 diabetes mellitus ([place]) 04/10/2013  . Sex counseling 04/10/2013  . Edema leg 04/10/2013  . Upper respiratory infection, viral 04/10/2013  . Pedal edema 03/29/2013  . Pneumonia 12/12/2012  .  Essential hypertension 12/12/2012  . HLD (hyperlipidemia) 12/12/2012  . Tonsillar mass 12/01/2012  . 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 Leukocytosis   . Anemia   . High cholesterol   . Hypertension   . Blurred vision 09/11/2011  . Foot pain 09/11/2011  . Breast pain 09/11/2011  . Morbid obesity ([place]) 09/11/2011  . Fungal infection of nail 09/11/2011  . Avitaminosis D 09/11/2011    Expected Discharge Date: Expected Discharge Date: 03/10/19  Team Members Present: Physician leading conference: Dr. Courtney Heys Social Worker Present: Lennart Pall, [place], SW) Nurse Present: Other (comment)(Inka Jamison Oka, LPN) Case Manager: Karene Fry, RN PT Present: Excell Seltzer, PT OT Present: Roanna Epley, [place], OT SLP Present: Stormy Fabian, SLP PPS Coordinator present : Gunnar Fusi, Novella Olive, PT     Current Status/Progress Goal Weekly Team Focus  Bowel/Bladder   pt is continent of bowel and bladder. utilizing bed pan, has urinary frequency(LBM 02/10/19 )  to decrease freq  and maintain continence.   asses bowel and bladder q shift.    Swallow/Nutrition/ Hydration             ADL's   +2 bed mobility, +2 overall; transfers-Maxi Move; significant shoulder weakness  min A overall, mod A bathing  activity tolerance, sitting balance, BUE strengthening, BADL retraining   Mobility   activity tolerance, BADL training, functional transfers, educaiton  mod A overall  bed mobility, transfers, sitting balance, endurance   Communication  Safety/Cognition/ Behavioral Observations            Pain   pt complaints of chest pain  to decrease pain  asses pain q shift and PRN   Skin   three blisters on back skin tear mid back, pressure injury coccyx, pressure injury on nose   to decrease wounds and heal current wounds  assess wounds q shift     Rehab Goals Patient on target to meet rehab goals: Yes *See Care Plan and  progress notes for long and short-term goals.     Barriers to Discharge  Current Status/Progress Possible Resolutions Date Resolved   Nursing                  PT  Decreased caregiver support;Incontinence;Lack of/limited family support;Weight                 OT Weight                SLP                SW                Discharge Planning/Teaching Needs:  Pt to d/c home with another daughter with home having better w/c accessibility.  3 adult children to share in providing 24/7 assistance.  Teaching needs still TBD   Team Discussion: Covid, PNA, CIM, weaning O2, constipation, ordered mag citrate and enema as needed, K+ 3.1, repleated.  RN tylenol for pain, O2 2L, BS 258.  PT mod/max roll, sit to supine +2, maximove used, 90% sats off O2, mod A goals.  OT min A goals, mod A bath goals, is motivated.   Revisions to Treatment Plan: N/A     Medical Summary Current Status: coccyx decub; weaning off O2; BGs 200s; Weekly Focus/Goal: mod-max bed mobility of2 for supine to sit; maxi-sky for transfers- 90% on RA during therapy  Barriers to Discharge: Decreased family/caregiver support;Home enviroment access/layout;Wound care;Other (comments);Weight;New oxygen  Barriers to Discharge Comments: constipation and critical illness myopathy Possible Resolutions to Barriers: OT- goals min assist-mod assist; a LOT of shoulder weakness;   Continued Need for Acute Rehabilitation Level of Care: The patient requires daily medical management by a physician with specialized training in physical medicine and rehabilitation for the following reasons: Direction of a multidisciplinary physical rehabilitation program to maximize functional independence : Yes Medical management of patient stability for increased activity during participation in an intensive rehabilitation regime.: Yes Analysis of laboratory values and/or radiology reports with any subsequent need for medication adjustment and/or medical  intervention. : Yes   I attest that I was present, lead the team conference, and concur with the assessment and plan of the team.   Jodell Cipro M 02/13/2019, 7:59 PM   Team conference was held via web/ teleconference due to [place] - 19

## 2019-02-14 ENCOUNTER — Inpatient Hospital Stay (HOSPITAL_COMMUNITY): Payer: Medicaid Other | Admitting: Physical Therapy

## 2019-02-14 ENCOUNTER — Inpatient Hospital Stay (HOSPITAL_COMMUNITY): Payer: Medicaid Other

## 2019-02-14 ENCOUNTER — Encounter (HOSPITAL_COMMUNITY): Payer: Medicaid Other | Admitting: Psychology

## 2019-02-14 LAB — GLUCOSE, CAPILLARY
Glucose-Capillary: 222 mg/dL — ABNORMAL HIGH (ref 70–99)
Glucose-Capillary: 247 mg/dL — ABNORMAL HIGH (ref 70–99)
Glucose-Capillary: 263 mg/dL — ABNORMAL HIGH (ref 70–99)
Glucose-Capillary: 254 mg/dL — ABNORMAL HIGH (ref 70–99)

## 2019-02-14 MED ORDER — INSULIN ASPART 100 UNIT/ML ~~LOC~~ SOLN
16.0000 [IU] | Freq: Three times a day (TID) | SUBCUTANEOUS | Status: DC
  Administered 2019-02-14 – 2019-02-19 (×14): 16 [IU] via SUBCUTANEOUS

## 2019-02-14 MED ORDER — SENNA 8.6 MG PO TABS
1.0000 | ORAL_TABLET | Freq: Two times a day (BID) | ORAL | Status: DC
  Administered 2019-02-14 – 2019-02-24 (×21): 8.6 mg via ORAL
  Filled 2019-02-14 (×21): qty 1

## 2019-02-14 MED ORDER — INSULIN GLARGINE 100 UNIT/ML ~~LOC~~ SOLN
35.0000 [IU] | Freq: Two times a day (BID) | SUBCUTANEOUS | Status: DC
  Administered 2019-02-14 – 2019-02-16 (×4): 35 [IU] via SUBCUTANEOUS
  Filled 2019-02-14 (×7): qty 0.35

## 2019-02-14 NOTE — Progress Notes (Signed)
Inpatient Diabetes Program Recommendations  AACE/ADA: New Consensus Statement on Inpatient Glycemic Control (2015)  Target Ranges:  Prepandial:   less than 140 mg/dL      Peak postprandial:   less than 180 mg/dL (1-2 hours)      Critically ill patients:  140 - 180 mg/dL   Lab Results  Component Value Date   GLUCAP 247 (H) 02/14/2019   HGBA1C 7.1 (H) 01/13/2019   Diabetes history: DM 2 Outpatient Diabetes medications:  Insulin pump-Medtronic insulin pump Average total insulin daily 152 units/day (basal insulin=78 units daily)-per note from Dr. Tamala Julian Endocrinology in 08/2018 12 am - 2.5 units/hr, 8 am- 3.5 units/hr, 12 pm- 3.5 units/hr, 4 pm- 3.75 units/hr, 10 pm- 3.75 units/hr. Her insulin to carb ratio is now 1:5 and her sensitivity is 1:25. Ozempic 0.25 weekly Current orders for Inpatient glycemic control:  Lantus 30 units bid,  Novolog 15 units tid with meals  Novolog resistant tid with meals  Inpatient Diabetes Program Recommendations:    Consider increasing Lantus to 35 units bid.  Also consider increasing Novolog meal coverage to 16 units tid with meals.   Spoke with patient.  She does not currently have insulin pump with her but states that she may be able to get her daughter to bring on Friday.  She is concerned about her blood sugars being so high.  Explained that MD can make adjustments to insulin and she was agreeable.  States she is okay not being on insulin pump as long as her blood sugars are controlled.   Thanks,  Adah Perl, RN, BC-ADM Inpatient Diabetes Coordinator Pager [phone]

## 2019-02-14 NOTE — Progress Notes (Signed)
Occupational Therapy Session Note  Patient Details  Name: Kaitlin [name] MRN: [id number] Date of Birth: Apr 30, 1965  Today's Date: 02/14/2019 OT Individual Time: [id number] OT Individual Time Calculation (min): 70 min    Short Term Goals: Week 1:  OT Short Term Goal 1 (Week 1): patient will complete rolling in bed with min A of one OT Short Term Goal 2 (Week 1): patient will complete sit to/from supine with mod A of one OT Short Term Goal 3 (Week 1): patient will tolerate sitting in w/c or recliner 1-2 hours per day OT Short Term Goal 4 (Week 1): patient will complete UB bathing and dressing with mod A  Skilled Therapeutic Interventions/Progress Updates:    Pt resting in bed upon arrival and ready to get OOB.  Initial focus on bed mobility to facilitate placement of sling for MaxiSky. Rolling to L with mod A, rolling to R with max A+2. Seated in recliner pt engaged in grooming tasks including washing face and brushing teeth.  OT intervention with focus on supported and unsupported sitting balance, discharge planning, and BUE AAROM/PROM for shoulder flexion, adduction, horizontal adduction. Pt with increased muscular pain with movement. Discussed use of E-stim to facilitate increased movement and Kinesio Tape for pain management. Pt agreeable and will complete tomorrow. Pt remained seated in recliner with all needs within reach.   Therapy Documentation Precautions:  Precautions Precautions: Fall Precaution Comments: morbid obesity, monitor SpO2 and HR Restrictions Weight Bearing Restrictions: No   Pain:  Pt reports muscular pain in B upper arms with activity; emotional support   Therapy/Group: Individual Therapy  Leroy Libman 02/14/2019, 11:26 AM

## 2019-02-14 NOTE — Progress Notes (Signed)
Social Work Patient ID: Kaitlin [name], female   DOB: 12-10-1965, 54 y.o.   MRN: [id number]  Have reviewed team conference with pt, mother and daughter, Kaitlin [name] via phone.  All aware of targeted d/c date of 2/27 and mod assist w/c level goals overall.  They confirm plan that pt will d/c to daughter's home.  We discussed need for extensive family education prior to d/c.  Will continue to follow.   Shavawn Stobaugh, LCSW

## 2019-02-14 NOTE — Progress Notes (Addendum)
Physical Therapy Session Note  Patient Details  Name: Kaitlin [name] MRN: [id number] Date of Birth: Aug 20, 1965  Today's Date: 02/14/2019 PT Individual Time: 1100-1200 PT Individual Time Calculation (min): 60 min and 70 min  Short Term Goals: Week 1:  PT Short Term Goal 1 (Week 1): Pt will perform R/L rolling in bed with +2 mod assist PT Short Term Goal 2 (Week 1): Pt will perform supine<>sit with +2 mod assist PT Short Term Goal 3 (Week 1): Pt will initiate sit>stand using lift equipment as needed PT Short Term Goal 4 (Week 1): Pt will tolerate sitting OOB in w/c for at least 30 minutes  Skilled Therapeutic Interventions/Progress Updates:    Session 1: Pt received seated in recliner in room, agreeable to PT session. No complaints of pain. Pt reports urge to urinate. Maxi sky transfer back to bed. Rolling L/R with max A for placement of bedpan. Pt able to continently void on bedpan. Pt is dependent for pericare and donning of new brief. Maxi sky transfer back to recliner. Seated BLE strengthening therex while seated in recliner: marches, LAQ, heel/toe raises, hip add squeeze. Pt left seated in recliner in room with needs in reach at end of session. Pt on room air throughout session, SpO2 at 92% and above.  Session 2: Pt received seated in bed, agreeable to PT session. No complaints of pain this date. Rolling L/R with max A for placement of maxi sky sling. Maxi sky transfer to w/c, dependent transport via w/c to therapy gym, maxi sky transfer w/c to mat table. Pt demos good sitting balance EOM with SBA for balance, no anterior/posterior sway noted this date. Applied standing maxi sky sling to patient. Sit to stand x 3 reps with assist x 2 and minor assist from sling to prevent fall. Pt tolerates standing about 5 sec for first two bouts of standing and x 10 sec for final bout of standing. Pt becomes emotional after standing due to excitement. Provided emotional support and encouragement. Discussed  effects of Covid and critical illness myopathy with relation to patient's ongoing fatigue and progress in therapy. Pt requests to return to bed at end of session. Maxi sky transfer back to w/c then back to bed. Rolling L/R with max A for removal of maxi sky sling. Pt left supine in bed with needs in reach at end of session. Pt on room air throughout session, SpO2 at 93% and above.   Therapy Documentation Precautions:  Precautions Precautions: Fall Precaution Comments: morbid obesity, monitor SpO2 and HR Restrictions Weight Bearing Restrictions: No    Therapy/Group: Individual Therapy   Excell Seltzer, PT, DPT  02/14/2019, 3:40 PM

## 2019-02-14 NOTE — Consult Note (Signed)
Neuropsychological Consultation   Patient:   Kaitlin [name]   DOB:   March 18, 1965  MR Number:  [id number]  Location:  [facility] [address] [facility] [id number] [place] [address] Dept: [place]: [phone]           Date of Service:   02/14/2019  Start Time:   8 AM End Time:   9 AM  Provider/Observer:  Ilean Skill, Psy.D.       Clinical Neuropsychologist       Billing Code/Service: [phone]  Chief Complaint:    Kaitlin [name] is a 54 year old female with history of anemia, bronchitis, bilateral cataracts, NICM, chronic diastolic CHF, diabetes with insulin pump, hyperlipidemia, hypertension, morbid obesity, sickle cell trait, history of thymoma with resection in 2015. Presented on 01/13/19 with increasing SOB, cough, generalized weakness and nausea.  SARS coronavirus positive 01/12/19.  Chest x-ray concerning for multifocal pneumonia.  Complicated course including need for intubation and extubated on 01/31/19 and slowly weaned off supplemental oxygen.  Now admitted for CIR program.     Patient had seen neurology in 2016 due to weakness and leg pain after tumor removal.  CT and MRI at time were WNL.    Reason for Service:  Patient referred for neuropsychological consultation due to coping and adjustment.  Below is the HPI for the current admission.  HPI: Kaitlin [name] is a 54 year old right-handed female with history of anemia, bronchitis, bilateral cataracts, NICM,chronic diastolic congestive heart failure, type 2 diabetes mellitus, hyperlipidemia, hypertension, morbid obesity with BMI 54.50, sickle cell trait, history of thymoma with resection 2015.  History taken from chart review and patient. Patient lives with her family.  1 level apartment with level entry.  Independent prior to admission.  She is unemployed.  Patient does have family in the area to provide assistance at discharge.  She presented on  01/13/2019 with increasing SOB, cough, generalized weakness, and nausea. Patient had been exposed to her son who recently was positive for COVID-19.  In the ED WBC 8.7, hemoglobin 12.7, platelets 203,000, lactic acid normal, BUN 22, creatinine 1.17, D-dimer 1.54, troponin high-sensitivity 29, SARS coronavirus 01/12/2019 positive.  It was noted patient recently checked for coronavirus 11/19/2018 that was negative.  Chest x-ray bilateral diffuse patchy airspace opacities concerning for multifocal pneumonia.  Patient received remdesivir, dexamethasone as well as actemra.  Antibiotics were later broadened due to shock requiring pressors and stress steroids and has since been completed.  Received 1000 mL normal saline bolus for hypotension and her Coreg was initially held.  Echocardiogram with EF of [id number], grade 1 diastolic dysfunction without emboli. Bilateral lower extremity Dopplers negative for DVT.  Patient did require intubation and extubated on 01/31/2019 and weaned off supplemental oxygen.  Patient did complete 21 days of therapy for COVID-19 sustaining improvement isolation later discontinued and patient was transferred from [facility] to [facility] 02/03/2019.  Patient has been receiving wound care for partial-thickness blistered areas.  Her diet has been advanced to a regular consistency.  Therapy evaluations completed and patient was admitted for a comprehensive rehab program. Please see preadmission assessment from earlier today as well.   Current Status:  Patient with positive affect and oriented x4.  She reports that she still has not regained taste sensation yet but is improving.  Patient denies awareness of any significant cognitive changes currently.  Still having trouble controlling blood glucose.  Consult has been made for  consideration of starting back insulin pump.  Reports continued fatigue and weakness.  Mood is appropriate to situation.  Behavioral Observation: Kaitlin [name]   presents as a 54 y.o.-year-old Right African American Female who appeared her stated age. her dress was Appropriate and she was Well Groomed and her manners were Appropriate to the situation.  her participation was indicative of Appropriate behaviors.  There were any physical disabilities noted.  she displayed an appropriate level of cooperation and motivation.     Interactions:    Active Appropriate  Attention:   abnormal and attention span appeared shorter than expected for age  Memory:   within normal limits; recent and remote memory intact  Visuo-spatial:  not examined  Speech (Volume):  normal  Speech:   normal; normal  Thought Process:  Coherent and Relevant  Though Content:  WNL; not suicidal and not homicidal  Orientation:   person, place, time/date and situation  Judgment:   Fair  Planning:   Fair  Affect:    Appropriate  Mood:    Euthymic  Insight:   Good  Intelligence:   normal  Medical History:   Past Medical History:  Diagnosis Date  . Anemia   . Bronchitis   . Cataracts, bilateral 01/12/2018  . CHF (congestive heart failure) ([place])   . Diabetes mellitus   . High cholesterol   . Hypertension    takes medicine to protect kidneys, does not have HTN  . Leukocytosis   . Morbid obesity ([place]) 09/11/2011   BMI 57  . Neuropathy 01/12/2018  . NICM (nonischemic cardiomyopathy) ([place]) 05/01/2013   Overview:  Last Assessment & Plan:  euvolemic on physical exam.   . Obesity   . Sickle cell trait ([place])   . Thymoma    Psychiatric History:  No prior psychiatric history  Family Med/Psych History:  Family History  Problem Relation Age of Onset  . Heart disease Father        No details  . Diabetes type II Other        Family HX  . Breast cancer Other    Impression/DX:  Kaitlin [name] is a 54 year old female with history of anemia, bronchitis, bilateral cataracts, NICM, chronic diastolic CHF, diabetes with insulin pump, hyperlipidemia, hypertension, morbid  obesity, sickle cell trait, history of thymoma with resection in 2015. Presented on 01/13/19 with increasing SOB, cough, generalized weakness and nausea.  SARS coronavirus positive 01/12/19.  Chest x-ray concerning for multifocal pneumonia.  Complicated course including need for intubation and extubated on 01/31/19 and slowly weaned off supplemental oxygen.  Now admitted for CIR program.     Patient with positive affect and oriented x4.  She reports that she still has not regained taste sensation yet but is improving.  Patient denies awareness of any significant cognitive changes currently.  Still having trouble controlling blood glucose.  Consult has been made for consideration of starting back insulin pump.  Reports continued fatigue and weakness.  Mood is appropriate to situation.  Patient had seen neurology in 2016 due to weakness and leg pain after tumor removal.  CT and MRI at time were WNL.   Disposition/Plan:  Worked on coping with long hospital course.  Will follow-up next week.         Electronically Signed   _______________________ Ilean Skill, Psy.D.

## 2019-02-14 NOTE — Progress Notes (Signed)
PHYSICAL MEDICINE & REHABILITATION PROGRESS NOTE   Subjective/Complaints:  Pt reports she had multiple BMs- large BMs with mg citrate and suppository- stomach feeling better- now having new onset back pain-   Pt asking when could get DM coordinator here to help with BGs Per staff, pt used to be on insulin pump- will consult DM coordinator in that case.    ROS- pt denies SOB, CP, abd pain; N/V/C/D/ vision changes, Headache. Objective:   No results found. Recent Labs    02/12/19 0615  WBC 8.8  HGB 13.7  HCT 41.4  PLT 506*   Recent Labs    02/12/19 0615  NA 136  K 3.1*  CL 90*  CO2 29  GLUCOSE 256*  BUN 20  CREATININE 0.97  CALCIUM 10.1    Intake/Output Summary (Last 24 hours) at 02/14/2019 0941 Last data filed at 02/14/2019 0830 Gross per 24 hour  Intake 360 ml  Output 250 ml  Net 110 ml     Physical Exam: Vital Signs Blood pressure 106/68, pulse 94, temperature 98.2 F (36.8 C), temperature source Oral, resp. rate 18, height 5\' 9"  (1.753 m), weight (!) 154.7 kg, last menstrual period 06/17/2012, SpO2 100 %. Vitals reviewed. Constitutional: She appears well-developed. On bariatric low pressure relief bed Super obesity; lying supine in bed; wearing night time o2 by [place]- asked me to turn it off, which I did, NAD HENT: [place] in place. Head: Normocephalic.  Facial abrasisions esp on nose- healing but scarring lighter than expected  Eyes: EOM are normal. Neck: No tracheal deviation present.  CV: RRR Respiratory: decreased breath sounds at bases-  wearing O2 this AM- weaned to zero GI: Soft. NT, ND, protuberant, (+)BS Musculoskeletal:     Comments: LE edema  Neurological: She is alert.  Motor: RUE: shoulder abduction 2+/5, elbow flexion/extension 3-/5, hand grip 4-/5 LUE: shoulder abduction 2/5, elbow flexion/extension 2+/5, hand grip 3/5 B/l LE: HF, KE 3+/5, ADF 4-/5 Sensation intact to light touch  Skin: Skin is warm and dry.  Sacral ulcer not  examined See above  Psychiatric: slightly flat affect    Assessment/Plan: 1. Functional deficits secondary to critical illness myopathy which require 3+ hours per day of interdisciplinary therapy in a comprehensive inpatient rehab setting.  Physiatrist is providing close team supervision and 24 hour management of active medical problems listed below.  Physiatrist and rehab team continue to assess barriers to discharge/monitor patient progress toward functional and medical goals  Care Tool:  Bathing    Body parts bathed by patient: Chest, Abdomen, Face   Body parts bathed by helper: Right arm, Left arm, Chest, Abdomen, Front perineal area, Buttocks, Right upper leg, Left upper leg, Right lower leg, Left lower leg     Bathing assist Assist Level: 2 Helpers     Upper Body Dressing/Undressing Upper body dressing   What is the patient wearing?: Hospital gown only    Upper body assist Assist Level: Total Assistance - Patient < 25%    Lower Body Dressing/Undressing Lower body dressing      What is the patient wearing?: Incontinence brief     Lower body assist Assist for lower body dressing: 2 Helpers     Toileting Toileting    Toileting assist Assist for toileting: 2 Helpers     Transfers Chair/bed transfer  Transfers assist  Chair/bed transfer activity did not occur: Safety/medical concerns  Chair/bed transfer assist level: Dependent - mechanical lift     Locomotion Ambulation   Ambulation  assist   Ambulation activity did not occur: Safety/medical concerns          Walk 10 feet activity   Assist  Walk 10 feet activity did not occur: Safety/medical concerns        Walk 50 feet activity   Assist Walk 50 feet with 2 turns activity did not occur: Safety/medical concerns         Walk 150 feet activity   Assist Walk 150 feet activity did not occur: Safety/medical concerns         Walk 10 feet on uneven surface  activity   Assist  Walk 10 feet on uneven surfaces activity did not occur: Safety/medical concerns         Wheelchair     Assist Will patient use wheelchair at discharge?: Yes(TBD) Type of Wheelchair: Manual(Per chart review LT goals set for dischrage )           Wheelchair 50 feet with 2 turns activity    Assist    Wheelchair 50 feet with 2 turns activity did not occur: Safety/medical concerns       Wheelchair 150 feet activity     Assist  Wheelchair 150 feet activity did not occur: Safety/medical concerns       Blood pressure 106/68, pulse 94, temperature 98.2 F (36.8 C), temperature source Oral, resp. rate 18, height 5\' 9"  (1.753 m), weight (!) 154.7 kg, last menstrual period 06/17/2012, SpO2 100 %.   Medical Problem List and Plan: 1.  Decreased functional mobility secondary to CIM due to acute hypoxemic respiratory failure due to ARDS/COVID-19 pneumonia.  Extubated 01/31/2019             -patient may shower             -ELOS/Goals: 27-32 days/Mod A            Continue CIR therapies.    -satting well on room air, changed O2 to PRN. 2.  Antithrombotics: -DVT/anticoagulation: Lovenox.  Venous Doppler studies negative             -antiplatelet therapy: Aspirin 81 mg daily 3. Pain Management: Tylenol as needed 4. Mood: Provide emotional support             -antipsychotic agents: N/A 5. Neuropsych: This patient is capable of making decisions on her own behalf. 6. Skin/Wound Care: Routine skin checks 7. Fluids/Electrolytes/Nutrition: Routine in and outs.  CMP ordered for 2/1  2/1- Hypokalemia- 3.1- will replete and recheck Wednesday  2/3- will recheck in AM 8.  Acute on chronic diastolic congestive heart failure.  Echocardiogram ejection fraction 50%.  Monitor for any signs of fluid overload.  Continue Demadex 20 mg twice daily             Daily weights.   Filed Weights   02/12/19 0500 02/12/19 1000 02/13/19 0414  Weight: 123 kg (!) 154.7 kg (!) 154.7 kg    2/1- weight  changed >30 lbs- will check into   2/2- same weight as yesterday.   2/3- no weight today 9.  Diabetes mellitus with peripheral neuropathy.  Hemoglobin A1c 7.1.  NovoLog 15 units 3 times daily with meals, Lantus insulin 25 units twice daily.  Check blood sugars before meals and at bedtime.  Diabetic teaching  1/30: CBG most recent reads: 231, 265, 166, 289- uncontrolled. Will increase Lantus to 26U BID.   1/31: Glucose in 300s today. Will increase Lantus to 27U BID.    CBG (last 3)  Recent  Labs    02/13/19 1825 02/13/19 2102 02/14/19 0602  GLUCAP 299* 240* 222*    2/1- just got Lantus increased yesterday- will look at trend in AM and titrate Lantus  2/2- Most BGs 200s- will increase Lantus to 30 units BID and monitor  2/3- place consult for DM coordinator since used to be on insulin pump at home- will see how they can help- I appreciate the assistance.              Monitor with increased mobility 10.  Cardiogenic shock.  Wean from pressors.  Monitor for orthostasis.  Continue Coreg 12.5 mg twice daily 11.  Super obesity.  BMI 54.50.  Dietary follow-up.  Encourage weight loss 12.  Hyperlipidemia.  Crestor 13.  History of anemia.  Continue iron supplement.  Latest hemoglobin 11.6.             CBC ordered for 2/1  2/1- Hb 13.7 14.  Sickle cell trait.  Follow-up outpatient. 15. Constipation  2/2- will give Mg citrate and soap suds enema today to get pt cleaned out.   2/3- cleaned out per pt- will con't to monitor- and see if need to increase bowel meds. Will add Senokot 1 tab BID      LOS: 5 days A FACE TO FACE EVALUATION WAS PERFORMED  Myalee Stengel 02/14/2019, 9:41 AM

## 2019-02-14 NOTE — Plan of Care (Signed)
  Problem: Consults Goal: RH GENERAL PATIENT EDUCATION Description: See Patient Education module for education specifics. Outcome: Progressing Goal: Skin Care Protocol Initiated - if Braden Score 18 or less Description: If consults are not indicated, leave blank or document N/A Outcome: Progressing   Problem: RH BLADDER ELIMINATION Goal: RH STG MANAGE BLADDER WITH ASSISTANCE Description: STG Manage Bladder With mod Assistance Outcome: Progressing   Problem: RH SKIN INTEGRITY Goal: RH STG SKIN FREE OF INFECTION/BREAKDOWN Description: Pt will be free of skin breakdown or pressure ulcers prior to DC with mod assist Outcome: Progressing Goal: RH STG MAINTAIN SKIN INTEGRITY WITH ASSISTANCE Description: STG Maintain Skin Integrity With mod Assistance. Outcome: Progressing Goal: RH STG ABLE TO PERFORM INCISION/WOUND CARE W/ASSISTANCE Description: STG Able To Perform Incision/Wound Care With mod Assistance. Outcome: Progressing   Problem: RH SAFETY Goal: RH STG ADHERE TO SAFETY PRECAUTIONS W/ASSISTANCE/DEVICE Description: STG Adhere to Safety Precautions With mod Assistance/Device. Outcome: Progressing   Problem: Consults Goal: RH GENERAL PATIENT EDUCATION Description: See Patient Education module for education specifics. Outcome: Progressing Goal: Skin Care Protocol Initiated - if Braden Score 18 or less Description: If consults are not indicated, leave blank or document N/A Outcome: Progressing   Problem: RH BLADDER ELIMINATION Goal: RH STG MANAGE BLADDER WITH ASSISTANCE Description: STG Manage Bladder With mod Assistance Outcome: Progressing   Problem: RH SKIN INTEGRITY Goal: RH STG SKIN FREE OF INFECTION/BREAKDOWN Description: Pt will be free of skin breakdown or pressure ulcers prior to DC with mod assist Outcome: Progressing Goal: RH STG MAINTAIN SKIN INTEGRITY WITH ASSISTANCE Description: STG Maintain Skin Integrity With mod Assistance. Outcome: Progressing Goal: RH  STG ABLE TO PERFORM INCISION/WOUND CARE W/ASSISTANCE Description: STG Able To Perform Incision/Wound Care With mod Assistance. Outcome: Progressing   Problem: RH SAFETY Goal: RH STG ADHERE TO SAFETY PRECAUTIONS W/ASSISTANCE/DEVICE Description: STG Adhere to Safety Precautions With mod Assistance/Device. Outcome: Progressing

## 2019-02-15 ENCOUNTER — Inpatient Hospital Stay (HOSPITAL_COMMUNITY): Payer: Medicaid Other

## 2019-02-15 ENCOUNTER — Inpatient Hospital Stay (HOSPITAL_COMMUNITY): Payer: Medicaid Other | Admitting: Physical Therapy

## 2019-02-15 ENCOUNTER — Inpatient Hospital Stay (HOSPITAL_COMMUNITY): Payer: Medicaid Other | Admitting: *Deleted

## 2019-02-15 LAB — BASIC METABOLIC PANEL
Anion gap: 13 (ref 5–15)
BUN: 20 mg/dL (ref 6–20)
CO2: 31 mmol/L (ref 22–32)
Calcium: 9.7 mg/dL (ref 8.9–10.3)
Chloride: 92 mmol/L — ABNORMAL LOW (ref 98–111)
Creatinine, Ser: 1.06 mg/dL — ABNORMAL HIGH (ref 0.44–1.00)
GFR calc Af Amer: >60 mL/min (ref >60)
GFR calc non Af Amer: >60 mL/min (ref >60)
Glucose, Bld: 220 mg/dL — ABNORMAL HIGH (ref 70–99)
Potassium: 3.3 mmol/L — ABNORMAL LOW (ref 3.5–5.1)
Sodium: 136 mmol/L (ref 135–145)

## 2019-02-15 LAB — CBC WITH DIFFERENTIAL/PLATELET
Abs Immature Granulocytes: 0.03 10*3/uL (ref 0.00–0.07)
Basophils Absolute: 0 10*3/uL (ref 0.0–0.1)
Basophils Relative: 0 %
Eosinophils Absolute: 0 10*3/uL (ref 0.0–0.5)
Eosinophils Relative: 0 %
HCT: 37.6 % (ref 36.0–46.0)
Hemoglobin: 12.6 g/dL (ref 12.0–15.0)
Immature Granulocytes: 0 %
Lymphocytes Relative: 43 %
Lymphs Abs: 3.3 10*3/uL (ref 0.7–4.0)
MCH: 27 pg (ref 26.0–34.0)
MCHC: 33.5 g/dL (ref 30.0–36.0)
MCV: 80.7 fL (ref 80.0–100.0)
Monocytes Absolute: 0.9 10*3/uL (ref 0.1–1.0)
Monocytes Relative: 11 %
Neutro Abs: 3.4 10*3/uL (ref 1.7–7.7)
Neutrophils Relative %: 46 %
Platelets: 385 10*3/uL (ref 150–400)
RBC: 4.66 MIL/uL (ref 3.87–5.11)
RDW: 18.2 % — ABNORMAL HIGH (ref 11.5–15.5)
WBC: 7.6 10*3/uL (ref 4.0–10.5)
nRBC: 0 % (ref 0.0–0.2)

## 2019-02-15 LAB — GLUCOSE, CAPILLARY
Glucose-Capillary: 232 mg/dL — ABNORMAL HIGH (ref 70–99)
Glucose-Capillary: 214 mg/dL — ABNORMAL HIGH (ref 70–99)
Glucose-Capillary: 188 mg/dL — ABNORMAL HIGH (ref 70–99)
Glucose-Capillary: 65 mg/dL — ABNORMAL LOW (ref 70–99)

## 2019-02-15 MED ORDER — WHITE PETROLATUM EX OINT
TOPICAL_OINTMENT | CUTANEOUS | Status: AC
  Filled 2019-02-15: qty 28.35

## 2019-02-15 MED ORDER — POTASSIUM CHLORIDE CRYS ER 20 MEQ PO TBCR
40.0000 meq | EXTENDED_RELEASE_TABLET | Freq: Two times a day (BID) | ORAL | Status: AC
  Administered 2019-02-15 (×2): 40 meq via ORAL
  Filled 2019-02-15 (×2): qty 2

## 2019-02-15 MED ORDER — CARVEDILOL 6.25 MG PO TABS
6.2500 mg | ORAL_TABLET | Freq: Two times a day (BID) | ORAL | Status: DC
  Administered 2019-02-15 – 2019-03-10 (×46): 6.25 mg via ORAL
  Filled 2019-02-15 (×48): qty 1

## 2019-02-15 NOTE — Progress Notes (Signed)
Occupational Therapy Session Note  Patient Details  Name: Kaitlin [name] MRN: [id number] Date of Birth: [birth year]  Today's Date: 02/15/2019 OT Individual Time: 0900-1000 OT Individual Time Calculation (min): 60 min    Short Term Goals: Week 1:  OT Short Term Goal 1 (Week 1): patient will complete rolling in bed with min A of one OT Short Term Goal 2 (Week 1): patient will complete sit to/from supine with mod A of one OT Short Term Goal 3 (Week 1): patient will tolerate sitting in w/c or recliner 1-2 hours per day OT Short Term Goal 4 (Week 1): patient will complete UB bathing and dressing with mod A  Skilled Therapeutic Interventions/Progress Updates:    OT intervention with focus on bed mobility, grooming seated in recliner, and BUE strengthening/function to increase independence with BADLs. Rolling to L with min A using bed rails and rolling to R with mod A+2 using bed rails.  MaxiSky lift to recliner. Pt completed grooming tasks seated in recliner.  Kinesio Tape applied to B deltoid area for pain management. PROM/AAROM of BUE. RUE with increased AROM. Pt notes increased pain with active movement. Pt remained in recliner with all needs within reach.  Therapy Documentation Precautions:  Precautions Precautions: Fall Precaution Comments: morbid obesity, monitor SpO2 and HR Restrictions Weight Bearing Restrictions: No    Pain:  Pt c/o B upper arm pain; repositioned and Kinesio Tape applied   Therapy/Group: Individual Therapy  Leroy Libman 02/15/2019, 10:26 AM

## 2019-02-15 NOTE — Progress Notes (Signed)
On-call provider made aware of low BP values from nighttime and morning vitals, as well as low grade temp of 99.3 @ 03:21. Patient denies being dizzy or lightheaded. No acute signs of distress. No new orders. Will continue to monitor.

## 2019-02-15 NOTE — Progress Notes (Signed)
Physical Therapy Session Note  Patient Details  Name: Kaitlin [name] MRN: [id number] Date of Birth: 1965-03-05  Today's Date: 02/15/2019 PT Individual Time: 1100-1155; [id number] PT Individual Time Calculation (min): 55 min and 72 min  Short Term Goals: Week 1:  PT Short Term Goal 1 (Week 1): Pt will perform R/L rolling in bed with +2 mod assist PT Short Term Goal 2 (Week 1): Pt will perform supine<>sit with +2 mod assist PT Short Term Goal 3 (Week 1): Pt will initiate sit>stand using lift equipment as needed PT Short Term Goal 4 (Week 1): Pt will tolerate sitting OOB in w/c for at least 30 minutes  Skilled Therapeutic Interventions/Progress Updates:    Session 1: Pt received seated in recliner in room, agreeable to PT session. Pt reports an ache in B shoulders, L>R, not rated. Pt reports using Tylenol for pain relief, declines any other intervention at this time. Pt requesting to wash pericare due to feeling of uncleanliness. Anterior and lateral leaning in recliner for maxi sky sling placement. Maxi sky transfer recliner to bed. Rolling L/R with mod A for placement of bedpan as pt reports urge to attempt urination. Pt unable to void once on bedpan. Pt is dependent for pericare and donning of new brief. Supine to sitting EOB with min A x 2. Sitting balance EOB x 10 min with Supervision to min A overall. While seated EOB pt able to perform anterior and lateral leans with min A to maintain sitting balance. Placement of maxi sky sling while seated EOB. Maxi sky transfer back to recliner. Pt left seated in recliner in room with needs in reach at end of session.  Session 2: Pt received seated in recliner in room, agreeable to PT session. Pt reports ongoing soreness in B shoulders, not rated. Pt declines any standing this PM due to elevated blood sugar, treatment team aware and pt has received insulin. Lateral and anterior leans in recliner with min A for placement of maxisky sling. Maxisky transfer  recliner to w/c. Demonstrated how to perform manual w/c propulsion with use of BUE and BLE. Pt requires mod A to propel w/c forwards x 30 ft with use of B UE/LE, fair ability to assist with LUE due to weakness. Pt is able to propel manual w/c backwards x 30 ft with use of BLE for strengthening. Seated balance in w/c with no back support performing ball toss, 3 x 15 reps. Maxisky transfer back to bed. Pt reports urge to urinate. Rolling L/R with mod A for placement of bedpan. Pt able to void (see Flowsheet for details). Pt is dependent for pericare and brief change. Pt left semi-reclined in bed with needs in reach at end of session.   Therapy Documentation Precautions:  Precautions Precautions: Fall Precaution Comments: morbid obesity, monitor SpO2 and HR Restrictions Weight Bearing Restrictions: No    Therapy/Group: Individual Therapy   Excell Seltzer, PT, DPT  02/15/2019, 12:03 PM

## 2019-02-15 NOTE — Progress Notes (Signed)
[place] PHYSICAL MEDICINE & REHABILITATION PROGRESS NOTE   Subjective/Complaints:  Pt reports her BGs are doing better with DM coordinator suggesting increase to Lantus 35 mg BID and  Novolog- 16 units with meals TID.  Pt also notes they are NOT putting mositurizer on nose anymore.   Family to bring in insulin pump on Friday.    ROS- pt denies SOB, CP, abd pain; N/V/C/D/ vision changes, Headache. Objective:   No results found. Recent Labs    02/15/19 0530  WBC 7.6  HGB 12.6  HCT 37.6  PLT 385   Recent Labs    02/15/19 0530  NA 136  K 3.3*  CL 92*  CO2 31  GLUCOSE 220*  BUN 20  CREATININE 1.06*  CALCIUM 9.7    Intake/Output Summary (Last 24 hours) at 02/15/2019 0934 Last data filed at 02/15/2019 0813 Gross per 24 hour  Intake 780 ml  Output 925 ml  Net -145 ml     Physical Exam: Vital Signs Blood pressure 99/60, pulse 100, temperature 99.3 F (37.4 C), resp. rate 20, height 5\' 9"  (1.753 m), weight (!) 156 kg, last menstrual period 06/17/2012, SpO2 94 %. Vitals reviewed. Constitutional: She appears well-developed. On bariatric low pressure relief bed Super obesity; lying supine in bed; wearing shower/night cap; brighter affectNAD HENT: [place] in place. Head: Normocephalic.  Facial abrasisions esp on nose- healing but scarring lighter color than expected - skin dry on face Eyes: EOM are normal. Neck: No tracheal deviation present.  CV: RRR Respiratory: decreased breath sounds at bases-otherwise good air movement B/L GI: Soft. NT, ND, protuberant, (+)BS Musculoskeletal:     Comments: LE edema  Neurological: She is alert.  Motor: RUE: shoulder abduction 2+/5, elbow flexion/extension 3-/5, hand grip 4-/5 LUE: shoulder abduction 2/5, elbow flexion/extension 2+/5, hand grip 3/5 B/l LE: HF, KE 3+/5, ADF 4-/5 Sensation intact to light touch  Skin: Skin is warm and dry.  Sacral ulcer not examined See above  Psychiatric: slightly flat  affect    Assessment/Plan: 1. Functional deficits secondary to critical illness myopathy which require 3+ hours per day of interdisciplinary therapy in a comprehensive inpatient rehab setting.  Physiatrist is providing close team supervision and 24 hour management of active medical problems listed below.  Physiatrist and rehab team continue to assess barriers to discharge/monitor patient progress toward functional and medical goals  Care Tool:  Bathing    Body parts bathed by patient: Chest, Abdomen, Face   Body parts bathed by helper: Right arm, Left arm, Chest, Abdomen, Front perineal area, Buttocks, Right upper leg, Left upper leg, Right lower leg, Left lower leg     Bathing assist Assist Level: 2 Helpers     Upper Body Dressing/Undressing Upper body dressing   What is the patient wearing?: Hospital gown only    Upper body assist Assist Level: Total Assistance - Patient < 25%    Lower Body Dressing/Undressing Lower body dressing      What is the patient wearing?: Incontinence brief     Lower body assist Assist for lower body dressing: 2 Helpers     Toileting Toileting    Toileting assist Assist for toileting: 2 Helpers     Transfers Chair/bed transfer  Transfers assist  Chair/bed transfer activity did not occur: Safety/medical concerns  Chair/bed transfer assist level: Dependent - mechanical lift     Locomotion Ambulation   Ambulation assist   Ambulation activity did not occur: Safety/medical concerns  Walk 10 feet activity   Assist  Walk 10 feet activity did not occur: Safety/medical concerns        Walk 50 feet activity   Assist Walk 50 feet with 2 turns activity did not occur: Safety/medical concerns         Walk 150 feet activity   Assist Walk 150 feet activity did not occur: Safety/medical concerns         Walk 10 feet on uneven surface  activity   Assist Walk 10 feet on uneven surfaces activity did not  occur: Safety/medical concerns         Wheelchair     Assist Will patient use wheelchair at discharge?: Yes(TBD) Type of Wheelchair: Manual(Per chart review LT goals set for dischrage )           Wheelchair 50 feet with 2 turns activity    Assist    Wheelchair 50 feet with 2 turns activity did not occur: Safety/medical concerns       Wheelchair 150 feet activity     Assist  Wheelchair 150 feet activity did not occur: Safety/medical concerns       Blood pressure 99/60, pulse 100, temperature 99.3 F (37.4 C), resp. rate 20, height 5\' 9"  (1.753 m), weight (!) 156 kg, last menstrual period 06/17/2012, SpO2 94 %.   Medical Problem List and Plan: 1.  Decreased functional mobility secondary to CIM due to acute hypoxemic respiratory failure due to ARDS/COVID-19 pneumonia.  Extubated 01/31/2019             -patient may shower             -ELOS/Goals: 27-32 days/Mod A            Continue CIR therapies.    -satting well on room air, changed O2 to PRN. 2.  Antithrombotics: -DVT/anticoagulation: Lovenox.  Venous Doppler studies negative             -antiplatelet therapy: Aspirin 81 mg daily 3. Pain Management: Tylenol as needed 4. Mood: Provide emotional support             -antipsychotic agents: N/A 5. Neuropsych: This patient is capable of making decisions on her own behalf. 6. Skin/Wound Care: Routine skin checks 7. Fluids/Electrolytes/Nutrition: Routine in and outs.  CMP ordered for 2/1  2/1- Hypokalemia- 3.1- will replete and recheck Wednesday  2/3- will recheck in AM  2/4- K+ 3.3- will replete 40 mEq x2 and recheck this weekend 8.  Acute on chronic diastolic congestive heart failure.  Echocardiogram ejection fraction 50%.  Monitor for any signs of fluid overload.  Continue Demadex 20 mg twice daily             Daily weights.   Filed Weights   02/12/19 1000 02/13/19 0414 02/15/19 0321  Weight: (!) 154.7 kg (!) 154.7 kg (!) 156 kg    2/1- weight changed >30  lbs- will check into   2/2- same weight as yesterday.   2/3- no weight today  2/4- Weight 156k- up 1.3 kg- will monitor 9.  Diabetes mellitus with peripheral neuropathy.  Hemoglobin A1c 7.1.  NovoLog 15 units 3 times daily with meals, Lantus insulin 25 units twice daily.  Check blood sugars before meals and at bedtime.  Diabetic teaching  1/30: CBG most recent reads: 231, 265, 166, 289- uncontrolled. Will increase Lantus to 26U BID.   1/31: Glucose in 300s today. Will increase Lantus to 27U BID.    CBG (last 3)  Recent Labs    02/14/19 1643 02/14/19 2107 02/15/19 0614  GLUCAP 263* 254* 232*    2/1- just got Lantus increased yesterday- will look at trend in AM and titrate Lantus  2/2- Most BGs 200s- will increase Lantus to 30 units BID and monitor  2/3- place consult for DM coordinator since used to be on insulin pump at home- will see how they can help- I appreciate the assistance  2/4- increased Lantus to 35 units BID and Novolog to 16 units TID with meals. Family to bring in insulin pump Friday? If so, will call DM coordinator to help.             Monitor with increased mobility 10.  Cardiogenic shock.  Wean from pressors.  Monitor for orthostasis.  Continue Coreg 12.5 mg twice daily  2/4- will reduce to 6.25 mg BID since BP soft last night/this AM at 90s/50s 11.  Super obesity.  BMI 54.50.  Dietary follow-up.  Encourage weight loss 12.  Hyperlipidemia.  Crestor 13.  History of anemia.  Continue iron supplement.  Latest hemoglobin 11.6.             CBC ordered for 2/1  2/1- Hb 13.7 14.  Sickle cell trait.  Follow-up outpatient. 15. Constipation  2/2- will give Mg citrate and soap suds enema today to get pt cleaned out.   2/3- cleaned out per pt- will con't to monitor- and see if need to increase bowel meds. Will add Senokot 1 tab BID      LOS: 6 days A FACE TO FACE EVALUATION WAS PERFORMED  Kaitlin [name] 02/15/2019, 9:34 AM

## 2019-02-15 NOTE — Significant Event (Signed)
Hypoglycemic Event  CBG: 65  Treatment: 4oz regular soda, crackers, peanut butter  Symptoms: Asymptomatic  Follow-up CBG: Time: 21:56 CBG Result: 98  Possible Reasons for Event: unknown  Comments/MD notified: hypoglycemia algorithm followed    Kaitlin [name]

## 2019-02-15 NOTE — Evaluation (Signed)
Recreational Therapy Assessment and Plan  Patient Details  Name: Kaitlin [name] MRN: [id number] Date of Birth: 11-23-1965 Today's Date: 02/15/2019  Rehab Potential: Good ELOS: Discharge 2/27  Assessment  Problem List:      Patient Active Problem List   Diagnosis Date Noted  . 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 COVID-19   . History of anemia   . Super obese   . Poorly controlled type 2 diabetes mellitus with peripheral neuropathy ([place])   . Acute on chronic diastolic (congestive) heart failure ([place])   . Hypernatremia   . Shock circulatory ([place])   . Septic shock ([place])   . Hypotension 01/13/2019  . Acute respiratory failure with hypoxemia ([place]) 01/13/2019  . Acute respiratory distress syndrome (ARDS) due to COVID-19 virus ([place]) 01/12/2019  . Grade I diastolic dysfunction 61/95/0932  . 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 Dyspnea on exertion 05/19/2016  . Other fatigue 05/19/2016  . Absolute anemia 04/15/2016  . Left leg weakness 10/18/2014  . Left leg paresthesias 10/18/2014  . Acute left-sided weakness 10/18/2014  . Encounter for central line placement 03/08/2014  . Taking drug for chronic disease 03/08/2014  . H/O insertion of insulin pump 02/18/2014  . Long term current use of insulin ([place]) 02/18/2014  . Insulin pump in place 02/18/2014  . 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 Abnormal ballistocardiogram 05/18/2013  . Abnormal nuclear stress test 05/18/2013  . 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 NICM (nonischemic cardiomyopathy) ([place]) 05/01/2013  . Insulin dependent type 2 diabetes mellitus ([place]) 04/10/2013  . Sex counseling 04/10/2013  . Edema leg 04/10/2013  . Upper respiratory infection, viral 04/10/2013  . Pedal edema 03/29/2013  . Pneumonia 12/12/2012  . Essential hypertension 12/12/2012  . HLD (hyperlipidemia) 12/12/2012  . Tonsillar mass 12/01/2012  . 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 Leukocytosis   . Anemia   . High  cholesterol   . Hypertension   . Blurred vision 09/11/2011  . Foot pain 09/11/2011  . Breast pain 09/11/2011  . Morbid obesity ([place]) 09/11/2011  . Fungal infection of nail 09/11/2011  . Avitaminosis D 09/11/2011   Past Medical History:      Past Medical History:  Diagnosis Date  . Anemia   . Bronchitis   . Cataracts, bilateral 01/12/2018  . CHF (congestive heart failure) ([place])   . Diabetes mellitus   . High cholesterol   . Hypertension    takes medicine to protect kidneys, does not have HTN  . Leukocytosis   . Morbid obesity ([place]) 09/11/2011   BMI 57  . Neuropathy 01/12/2018  . NICM (nonischemic cardiomyopathy) ([place]) 05/01/2013   Overview: Last Assessment & Plan: euvolemic on physical exam.   . Obesity   . Sickle cell trait ([place])   . Thymoma    Past Surgical History:       Past Surgical History:  Procedure Laterality Date  . COLONOSCOPY N/A 06/21/2017   Procedure: COLONOSCOPY; Surgeon: Ronnette Juniper, MD; Location: [facility]; Service: Gastroenterology; Laterality: N/A;  . LEFT HEART CATHETERIZATION WITH CORONARY ANGIOGRAM N/A 05/25/2013   Procedure: LEFT HEART CATHETERIZATION WITH CORONARY ANGIOGRAM; Surgeon: Troy Sine, MD; [facility]; Service: Cardiovascular; Laterality: N/A;  . LYMPH NODE BIOPSY Right 11/30/2012   Procedure: RIGHT TONSIL BIOPSY WITH FRESH FROZEN ANALYSIS; Surgeon: Jodi Marble, MD; Location: [facility]; Service: ENT; Laterality: Right;  . MEDIASTERNOTOMY N/A 01/12/2013   Procedure: MEDIAN STERNOTOMY; Surgeon: Gaye Pollack, MD; Location: [facility]; Service: Thoracic; Laterality: N/A;  .  MEDIASTINOTOMY CHAMBERLAIN MCNEIL  Right 11/06/2012   Procedure: MEDIASTINOTOMY CHAMBERLAIN MCNEIL PROCEDURE; Surgeon: Gaye Pollack, MD; Location: [place]; Service: Thoracic; Laterality: Right;  . POLYPECTOMY  06/21/2017   Procedure: POLYPECTOMY; Surgeon: Ronnette Juniper, MD; Location: [facility]; Service: Gastroenterology;;  . RESECTION OF A THYMOMA N/A 01/12/2013    Procedure: RESECTION OF A THYMOMA; Surgeon: Gaye Pollack, MD; Location: [facility]; Service: Thoracic; Laterality: N/A;  . TONSILLECTOMY    . TUBAL LIGATION     Assessment & Plan  Clinical Impression: Patient is a 53 y.o. year old female right-handed female with history of anemia, bronchitis, bilateral cataracts, NICM,chronic diastolic congestive heart failure, type 2 diabetes mellitus, hyperlipidemia, hypertension, morbid obesity with BMI 54.50, sickle cell trait, history of thymoma with resection 2015. History taken from chart review and patient. Patient lives with her family. 1 level apartment with level entry. Independent prior to admission. She is unemployed. Patient does have family in the area to provide assistance at discharge. She presented on 01/13/2019 with increasing SOB, cough, generalized weakness, and nausea. Patient had been exposed to her son who recently was positive for COVID-19. In the ED WBC 8.7, hemoglobin 12.7, platelets 203,000, lactic acid normal, BUN 22, creatinine 1.17, D-dimer 1.54, troponin high-sensitivity 29, SARS coronavirus 01/12/2019 positive. It was noted patient recently checked for coronavirus 11/19/2018 that was negative. Chest x-ray bilateral diffuse patchy airspace opacities concerning for multifocal pneumonia. Patient received remdesivir, dexamethasone as well as actemra. Antibiotics were later broadened due to shock requiring pressors and stress steroids and has since been completed. Received 1000 mL normal saline bolus for hypotension and her Coreg was initially held. Echocardiogram with EF of 96%, grade 1 diastolic dysfunction without emboli. Bilateral lower extremity Dopplers negative for DVT. Patient did require intubation and extubated on 01/31/2019 and weaned off supplemental oxygen. Patient did complete 21 days of therapy for COVID-19 sustaining improvement isolation later discontinued and patient was transferred from [facility] to [facility] 02/03/2019.  Patient has been receiving wound care for partial-thickness blistered areas. Her diet has been advanced to a regular consistency. Therapy evaluations completed and patient was admitted for a comprehensive rehab program. Please see preadmission assessment from earlier today as well. Patient transferred to CIR on 02/09/2019 .   Pt presents with decreased activity tolerance, decreased functional mobility, decreased balance, decreased leisure awareness, feelings of stress.  Plan Min 1 TR session >20 minutes per week during LOS  Recommendations for other services: None   Discharge Criteria: Patient will be discharged from TR if patient refuses treatment 3 consecutive times without medical reason.  If treatment goals not met, if there is a change in medical status, if patient makes no progress towards goals or if patient is discharged from hospital.  The above assessment, treatment plan, treatment alternatives and goals were discussed and mutually agreed upon: by patient  [place] 02/15/2019, 12:01 PM

## 2019-02-16 ENCOUNTER — Inpatient Hospital Stay (HOSPITAL_COMMUNITY): Payer: Medicaid Other | Admitting: Physical Therapy

## 2019-02-16 ENCOUNTER — Inpatient Hospital Stay (HOSPITAL_COMMUNITY): Payer: Medicaid Other

## 2019-02-16 LAB — GLUCOSE, CAPILLARY
Glucose-Capillary: 120 mg/dL — ABNORMAL HIGH (ref 70–99)
Glucose-Capillary: 184 mg/dL — ABNORMAL HIGH (ref 70–99)
Glucose-Capillary: 210 mg/dL — ABNORMAL HIGH (ref 70–99)
Glucose-Capillary: 223 mg/dL — ABNORMAL HIGH (ref 70–99)
Glucose-Capillary: 272 mg/dL — ABNORMAL HIGH (ref 70–99)
Glucose-Capillary: 98 mg/dL (ref 70–99)

## 2019-02-16 MED ORDER — INSULIN ASPART 100 UNIT/ML ~~LOC~~ SOLN
0.0000 [IU] | SUBCUTANEOUS | Status: DC
  Administered 2019-02-16: 16:00:00 3 [IU] via SUBCUTANEOUS
  Administered 2019-02-16 – 2019-02-17 (×2): 5 [IU] via SUBCUTANEOUS
  Administered 2019-02-17: 20:00:00 8 [IU] via SUBCUTANEOUS
  Administered 2019-02-17: 04:00:00 5 [IU] via SUBCUTANEOUS
  Administered 2019-02-17: 17:00:00 11 [IU] via SUBCUTANEOUS
  Administered 2019-02-17 – 2019-02-18 (×3): 5 [IU] via SUBCUTANEOUS
  Administered 2019-02-18: 8 [IU] via SUBCUTANEOUS
  Administered 2019-02-18 (×3): 5 [IU] via SUBCUTANEOUS
  Administered 2019-02-19: 8 [IU] via SUBCUTANEOUS
  Administered 2019-02-19: 04:00:00 3 [IU] via SUBCUTANEOUS
  Administered 2019-02-19: 8 [IU] via SUBCUTANEOUS

## 2019-02-16 MED ORDER — DICLOFENAC SODIUM 1 % EX GEL
2.0000 g | Freq: Four times a day (QID) | CUTANEOUS | Status: DC
  Administered 2019-02-16 – 2019-03-10 (×84): 2 g via TOPICAL
  Filled 2019-02-16 (×4): qty 100

## 2019-02-16 MED ORDER — INSULIN GLARGINE 100 UNIT/ML ~~LOC~~ SOLN
38.0000 [IU] | Freq: Two times a day (BID) | SUBCUTANEOUS | Status: DC
  Administered 2019-02-16 – 2019-02-18 (×4): 38 [IU] via SUBCUTANEOUS
  Filled 2019-02-16 (×5): qty 0.38

## 2019-02-16 NOTE — Progress Notes (Signed)
Occupational Therapy Session Note  Patient Details  Name: Kaitlin [name] MRN: [id number] Date of Birth: 25-Sep-1965  Today's Date: 02/16/2019 OT Individual Time: 0900-1010 OT Individual Time Calculation (min): 70 min    Short Term Goals: Week 2:  OT Short Term Goal 1 (Week 2): patient will complete rolling in bed with min A of one OT Short Term Goal 2 (Week 2): patient will complete sit to/from supine with mod A of one OT Short Term Goal 3 (Week 2): patient will complete UB bathing and dressing with mod A  Skilled Therapeutic Interventions/Progress Updates:    Pt resting in recliner upon arrival.  OT intervention with focus on NMES L shoulder to facilitate L shoulder flexion, LUE table tasks to improve functional reach for ADLs, LUE PROM/AAROM to increase independence with BADLs.  Pt reported her BG at 314 upon arrival but she was willing to participate.  NMES with minimal results with table tasks for reaching. Will attempt NMES during future sessions. Pt with improved LUE AROM.  Pt requested to return to bed.  Transfer to bed with MaxiSky. Pt independent with directing care. Pt remained in bed with all needs within reach.  Therapy Documentation Precautions:  Precautions Precautions: Fall Precaution Comments: morbid obesity, monitor SpO2 and HR Restrictions Weight Bearing Restrictions: No   Pain:  Pt c/o L shoulder discomfort but "ok"   Therapy/Group: Individual Therapy  Leroy Libman 02/16/2019, 12:15 PM

## 2019-02-16 NOTE — Progress Notes (Signed)
Physical Therapy Session Note  Patient Details  Name: Kaitlin [name] MRN: [id number] Date of Birth: 1965/07/22  Today's Date: 02/16/2019 PT Individual Time: [id number]; 1415-1530 PT Individual Time Calculation (min): 55 min and 75 min  Short Term Goals: Week 1:  PT Short Term Goal 1 (Week 1): Pt will perform R/L rolling in bed with +2 mod assist PT Short Term Goal 2 (Week 1): Pt will perform supine<>sit with +2 mod assist PT Short Term Goal 3 (Week 1): Pt will initiate sit>stand using lift equipment as needed PT Short Term Goal 4 (Week 1): Pt will tolerate sitting OOB in w/c for at least 30 minutes  Skilled Therapeutic Interventions/Progress Updates:    Session 1: Pt received seated in bed, agreeable to PT session. Pt reports some soreness in L shoulder this AM, MD to prescribe muscle rub. Pt reports urge to urinate. Rolling L/R with mod to max A for placement of bedpan. Pt is dependent for pericare and brief change. Pt is max A for UB bathing at bed level, max A to don new gown. Rolling L/R with max A for placement of maxi sky sling. Maxi sky transfer bed to recliner. Lateral and anterior leans with min A for removal of sling. Pt left seated in recliner in room with needs in reach at end of session.  Session 2: Pt received seated in bed, agreeable to PT session. No complaints of pain. Rolling L/R with max A for placement of maxisky sling and in order to don pants dependently. Maxisky transfer bed to w/c to therapy mat. Pt is dependent to don shoes. Lateral and anterior leaning for maxisky transfer sling removal and maxi sky standing sling placement. Pt is able to perform sit to stand x 5 reps to heavy duty RW with min A x 2 from elevated mat while in maxi sky standing sling for safety, no assist from sling to stand. Pt is able to take one step forward/backward with each LE. Pt fatigues quickly with standing activity. Maxisky transfer back to w/c then back to bed. Rolling L/R with max A for removal  of sling and doffing of pants. Pt reports urge to urinate. Placed bedpan, pt unable to void. Pt is max A for rolling L/R for dependent pericare and donning of new brief. Pt left semi-reclined in bed with needs in reach at end of session.  Therapy Documentation Precautions:  Precautions Precautions: Fall Precaution Comments: morbid obesity, monitor SpO2 and HR Restrictions Weight Bearing Restrictions: No    Therapy/Group: Individual Therapy   Excell Seltzer, PT, DPT  02/16/2019, 8:58 AM

## 2019-02-16 NOTE — Progress Notes (Addendum)
Inpatient Diabetes Program Recommendations  AACE/ADA: New Consensus Statement on Inpatient Glycemic Control (2015)  Target Ranges:  Prepandial:   less than 140 mg/dL      Peak postprandial:   less than 180 mg/dL (1-2 hours)      Critically ill patients:  140 - 180 mg/dL   Lab Results  Component Value Date   GLUCAP 223 (H) 02/16/2019   HGBA1C 7.1 (H) 01/13/2019    Review of Glycemic Control Results for Kaitlin [name], Kaitlin [name] (MRN [id number]) as of 02/16/2019 11:20  Ref. Range 02/15/2019 06:14 02/15/2019 12:00 02/15/2019 17:00 02/15/2019 21:33 02/15/2019 21:56 02/16/2019 06:22  Glucose-Capillary Latest Ref Range: 70 - 99 mg/dL 232 (H) 214 (H) 188 (H) 65 (L) 98 223 (H)   Diabetes history: DM 2 Outpatient Diabetes medications: insulin pump, Metformin 1000 gm bid, Semaglutide 0.25 mg weekly  Medtronic insulin pump Average total insulin daily 152 units/day (basal insulin=78 units daily) 12 am - 2.5 units/hr 8 am- 3.5 units/hr 12 pm- 3.5 units/hr 4 pm- 3.75 units/hr 10 pm- 3.75 units/hr  Her insulin to carb ratio is now 1:5 and her sensitivity is 1:25.  Current orders for Inpatient glycemic control:  Lantus 35 units bid Novolog 0-20 units tid Novolog 16 units tid meal coverage  Inpatient Diabetes Program Recommendations:    Would advise to remain off insulin pump  Glucose trends consistently elevated, believe the amount of Novolog last night in relation to lack of carbs the pt ate may have caused mild hypoglycemia.  -  Consider Increasing Lantus to 38 units bid -  Decrease Novolog Correction to 0-15 units tid  Spoke with pt at bedside at the request of pt. Pt would like more monitoring of glucose and was concerned over her glucose trends being elevated. Discussed with pt what she would be comfortable with. I suggested Q4 hour glucose checks and Novolog correction if needed. And also increase long acting insulin closer to what she would get in her insulin pump. Pt agreeable at this time. Spoke with  [name], PA about plan of care.  Thanks, Tama Headings RN, MSN, BC-ADM Inpatient Diabetes Coordinator Team Pager [phone] (8a-5p)

## 2019-02-16 NOTE — Progress Notes (Signed)
Occupational Therapy Weekly Progress Note  Patient Details  Name: Kaitlin [name] MRN: [id number] Date of Birth: 1965-04-19  Beginning of progress report period: February 10, 2019 End of progress report period: February 16, 2019  Patient has met 1 of 4 short term goals.  Pt progress has been steady but slow during the past week.  Pt requires mod A+2 for rolling to her R and max A for rolling to her L. Supine>sit EOB with max A+2. Pt currently using MaxiSky for tranfsers to recliner or w/c. Pt with significant shoulder flexion weakness and muscular pain limiting ADLs at this time.  Pt completes grooming tasks with supervision and is currently wearing a hospital gown.  Pt is currently on night bath. Pt tolerating sitting in recliner/w/c 2+ hours per day. Pt is motivated and encouraged by daily improvement.  Patient continues to demonstrate the following deficits: muscle weakness, decreased cardiorespiratoy endurance and decreased oxygen support and decreased sitting balance, decreased standing balance and decreased postural control and therefore will continue to benefit from skilled OT intervention to enhance overall performance with BADL, iADL and Reduce care partner burden.  Patient progressing toward long term goals..  Continue plan of care.  OT Short Term Goals Week 1:  OT Short Term Goal 1 (Week 1): patient will complete rolling in bed with min A of one OT Short Term Goal 1 - Progress (Week 1): Progressing toward goal OT Short Term Goal 2 (Week 1): patient will complete sit to/from supine with mod A of one OT Short Term Goal 2 - Progress (Week 1): Progressing toward goal OT Short Term Goal 3 (Week 1): patient will tolerate sitting in w/c or recliner 1-2 hours per day OT Short Term Goal 3 - Progress (Week 1): Met OT Short Term Goal 4 (Week 1): patient will complete UB bathing and dressing with mod A OT Short Term Goal 4 - Progress (Week 1): Progressing toward goal Week 2:  OT Short Term Goal 1  (Week 2): patient will complete rolling in bed with min A of one OT Short Term Goal 2 (Week 2): patient will complete sit to/from supine with mod A of one OT Short Term Goal 3 (Week 2): patient will complete UB bathing and dressing with mod A    Leroy Libman 02/16/2019, 6:36 AM

## 2019-02-16 NOTE — Progress Notes (Addendum)
[facility] PHYSICAL MEDICINE & REHABILITATION PROGRESS NOTE   Subjective/Complaints:  Got insulin pump- has put on monitor.  Feeling OK, but PT reminded pt that she was complaining of L shoulder pain- would rather have topical since "takes so many pills".   Will try Voltaren gel-   ROS- pt denies SOB, CP, abd pain; N/V/C/D/ vision changes, Headache. Objective:   No results found. Recent Labs    02/15/19 0530  WBC 7.6  HGB 12.6  HCT 37.6  PLT 385   Recent Labs    02/15/19 0530  NA 136  K 3.3*  CL 92*  CO2 31  GLUCOSE 220*  BUN 20  CREATININE 1.06*  CALCIUM 9.7    Intake/Output Summary (Last 24 hours) at 02/16/2019 1014 Last data filed at 02/15/2019 1845 Gross per 24 hour  Intake 480 ml  Output --  Net 480 ml     Physical Exam: Vital Signs Blood pressure 109/67, pulse 96, temperature 98.4 F (36.9 C), temperature source Oral, resp. rate 18, height 5\' 9"  (1.753 m), weight (!) 151 kg, last menstrual period 06/17/2012, SpO2 98 %. Vitals reviewed. Constitutional: She appears well-developed. On bariatric low pressure relief bed Super obesity; lying supine in bed; wearing shower/night cap; brighter affect; PT and nursing at bedside; NAD HENT: [place] in place. Head: Normocephalic.  Facial abrasisions esp on nose- healing but scarring lighter color than expected - skin dry on face- used vaseline on face Eyes: EOM are normal. Neck: No tracheal deviation present.  CV: RRR Respiratory: decreased breath sounds at bases-otherwise good air movement B/L GI: Soft. NT, ND, protuberant, (+)BS Musculoskeletal:     Comments: LE edema  Neurological: She is alert.  Motor: RUE: shoulder abduction 2+/5, elbow flexion/extension 3-/5, hand grip 4-/5 LUE: shoulder abduction 2/5, elbow flexion/extension 2+/5, hand grip 3/5 B/l LE: HF, KE 3+/5, ADF 4-/5 Sensation intact to light touch  Skin: Skin is warm and dry.  Sacral ulcer -difficult to turn- will d/w nursing See above   Psychiatric: slightly flat affect    Assessment/Plan: 1. Functional deficits secondary to critical illness myopathy which require 3+ hours per day of interdisciplinary therapy in a comprehensive inpatient rehab setting.  Physiatrist is providing close team supervision and 24 hour management of active medical problems listed below.  Physiatrist and rehab team continue to assess barriers to discharge/monitor patient progress toward functional and medical goals  Care Tool:  Bathing    Body parts bathed by patient: Abdomen, Face, Chest   Body parts bathed by helper: Right arm, Left arm, Chest, Abdomen, Front perineal area, Buttocks, Right upper leg, Left upper leg, Right lower leg, Left lower leg     Bathing assist Assist Level: Maximal Assistance - Patient 24 - 49%     Upper Body Dressing/Undressing Upper body dressing   What is the patient wearing?: Hospital gown only    Upper body assist Assist Level: Maximal Assistance - Patient 25 - 49%    Lower Body Dressing/Undressing Lower body dressing      What is the patient wearing?: Incontinence brief     Lower body assist Assist for lower body dressing: 2 Helpers     Toileting Toileting    Toileting assist Assist for toileting: 2 Helpers     Transfers Chair/bed transfer  Transfers assist  Chair/bed transfer activity did not occur: Safety/medical concerns  Chair/bed transfer assist level: Dependent - mechanical lift     Locomotion Ambulation   Ambulation assist   Ambulation activity did not  occur: Safety/medical concerns          Walk 10 feet activity   Assist  Walk 10 feet activity did not occur: Safety/medical concerns        Walk 50 feet activity   Assist Walk 50 feet with 2 turns activity did not occur: Safety/medical concerns         Walk 150 feet activity   Assist Walk 150 feet activity did not occur: Safety/medical concerns         Walk 10 feet on uneven surface   activity   Assist Walk 10 feet on uneven surfaces activity did not occur: Safety/medical concerns         Wheelchair     Assist Will patient use wheelchair at discharge?: Yes(TBD) Type of Wheelchair: Manual(Per chart review LT goals set for dischrage )           Wheelchair 50 feet with 2 turns activity    Assist    Wheelchair 50 feet with 2 turns activity did not occur: Safety/medical concerns       Wheelchair 150 feet activity     Assist  Wheelchair 150 feet activity did not occur: Safety/medical concerns       Blood pressure 109/67, pulse 96, temperature 98.4 F (36.9 C), temperature source Oral, resp. rate 18, height 5\' 9"  (1.753 m), weight (!) 151 kg, last menstrual period 06/17/2012, SpO2 98 %.   Medical Problem List and Plan: 1.  Decreased functional mobility secondary to CIM due to acute hypoxemic respiratory failure due to ARDS/COVID-19 pneumonia.  Extubated 01/31/2019             -patient may shower             -ELOS/Goals: 27-32 days/Mod A            Continue CIR therapies.    -satting well on room air, changed O2 to PRN. 2.  Antithrombotics: -DVT/anticoagulation: Lovenox.  Venous Doppler studies negative             -antiplatelet therapy: Aspirin 81 mg daily 3. Pain Management: Tylenol as needed  2/5- added voltaren gel for L shoulder QID 4. Mood: Provide emotional support             -antipsychotic agents: N/A 5. Neuropsych: This patient is capable of making decisions on her own behalf. 6. Skin/Wound Care: Routine skin checks 7. Fluids/Electrolytes/Nutrition: Routine in and outs.  CMP ordered for 2/1  2/1- Hypokalemia- 3.1- will replete and recheck Wednesday  2/3- will recheck in AM  2/4- K+ 3.3- will replete 40 mEq x2 and recheck this weekend 8.  Acute on chronic diastolic congestive heart failure.  Echocardiogram ejection fraction 50%.  Monitor for any signs of fluid overload.  Continue Demadex 20 mg twice daily             Daily  weights.   Filed Weights   02/13/19 0414 02/15/19 0321 02/16/19 0450  Weight: (!) 154.7 kg (!) 156 kg (!) 151 kg    2/1- weight changed >30 lbs- will check into   2/2- same weight as yesterday.   2/3- no weight today  2/4- Weight 156k- up 1.3 kg- will monitor  2/5- weight back down to 151 kg- down 5 kg- unlikely.  9.  Diabetes mellitus with peripheral neuropathy.  Hemoglobin A1c 7.1.  NovoLog 15 units 3 times daily with meals, Lantus insulin 25 units twice daily.  Check blood sugars before meals and at bedtime.  Diabetic teaching  CBG (last 3)  Recent Labs    02/15/19 2133 02/15/19 2156 02/16/19 0622  GLUCAP 65* 98 223*    2/1- just got Lantus increased yesterday- will look at trend in AM and titrate Lantus  2/2- Most BGs 200s- will increase Lantus to 30 units BID and monitor  2/3- place consult for DM coordinator since used to be on insulin pump at home- will see how they can help- I appreciate the assistance  2/4- increased Lantus to 35 units BID and Novolog to 16 units TID with meals. Family to bring in insulin pump Friday? If so, will call DM coordinator to help.  2/5- will get DM coordinator to help if going to use insulin pump.              Monitor with increased mobility 10.  Cardiogenic shock.  Wean from pressors.  Monitor for orthostasis.  Continue Coreg 12.5 mg twice daily  2/4- will reduce to 6.25 mg BID since BP soft last night/this AM at 90s/50s 11.  Super obesity.  BMI 54.50.  Dietary follow-up.  Encourage weight loss 12.  Hyperlipidemia.  Crestor 13.  History of anemia.  Continue iron supplement.  Latest hemoglobin 11.6.             CBC ordered for 2/1  2/1- Hb 13.7 14.  Sickle cell trait.  Follow-up outpatient. 15. Constipation  2/2- will give Mg citrate and soap suds enema today to get pt cleaned out.   2/3- cleaned out per pt- will con't to monitor- and see if need to increase bowel meds. Will add Senokot 1 tab BID      LOS: 7 days A FACE TO FACE  EVALUATION WAS PERFORMED  Gasper Hopes 02/16/2019, 10:14 AM

## 2019-02-16 NOTE — Plan of Care (Signed)
  Problem: Consults Goal: RH GENERAL PATIENT EDUCATION Description: See Patient Education module for education specifics. Outcome: Progressing Goal: Skin Care Protocol Initiated - if Braden Score 18 or less Description: If consults are not indicated, leave blank or document N/A Outcome: Progressing   Problem: RH BLADDER ELIMINATION Goal: RH STG MANAGE BLADDER WITH ASSISTANCE Description: STG Manage Bladder With mod Assistance Outcome: Progressing   Problem: RH SKIN INTEGRITY Goal: RH STG SKIN FREE OF INFECTION/BREAKDOWN Description: Pt will be free of skin breakdown or pressure ulcers prior to DC with mod assist Outcome: Progressing Goal: RH STG MAINTAIN SKIN INTEGRITY WITH ASSISTANCE Description: STG Maintain Skin Integrity With mod Assistance. Outcome: Progressing Goal: RH STG ABLE TO PERFORM INCISION/WOUND CARE W/ASSISTANCE Description: STG Able To Perform Incision/Wound Care With mod Assistance. Outcome: Progressing   Problem: RH SAFETY Goal: RH STG ADHERE TO SAFETY PRECAUTIONS W/ASSISTANCE/DEVICE Description: STG Adhere to Safety Precautions With mod Assistance/Device. Outcome: Progressing

## 2019-02-17 ENCOUNTER — Inpatient Hospital Stay (HOSPITAL_COMMUNITY): Payer: Medicaid Other | Admitting: Physical Therapy

## 2019-02-17 LAB — GLUCOSE, CAPILLARY
Glucose-Capillary: 229 mg/dL — ABNORMAL HIGH (ref 70–99)
Glucose-Capillary: 218 mg/dL — ABNORMAL HIGH (ref 70–99)
Glucose-Capillary: 245 mg/dL — ABNORMAL HIGH (ref 70–99)
Glucose-Capillary: 346 mg/dL — ABNORMAL HIGH (ref 70–99)
Glucose-Capillary: 287 mg/dL — ABNORMAL HIGH (ref 70–99)

## 2019-02-17 NOTE — Progress Notes (Signed)
[place] PHYSICAL MEDICINE & REHABILITATION PROGRESS NOTE   Subjective/Complaints:   Pt reports slept better than has in days since BGs are doing better; not allowed to use insulin pump, per DM coordinator while here. Can restart at d/c.     ROS- pt denies SOB, CP, abd pain; N/V/C/D/ vision changes, Headache. Objective:   No results found. Recent Labs    02/15/19 0530  WBC 7.6  HGB 12.6  HCT 37.6  PLT 385   Recent Labs    02/15/19 0530  NA 136  K 3.3*  CL 92*  CO2 31  GLUCOSE 220*  BUN 20  CREATININE 1.06*  CALCIUM 9.7    Intake/Output Summary (Last 24 hours) at 02/17/2019 1406 Last data filed at 02/16/2019 2355 Gross per 24 hour  Intake 480 ml  Output 400 ml  Net 80 ml     Physical Exam: Vital Signs Blood pressure 118/70, pulse 98, temperature 98.6 F (37 C), resp. rate 18, height 5\' 9"  (1.753 m), weight (!) 154.7 kg, last menstrual period 06/17/2012, SpO2 95 %. Vitals reviewed. Constitutional: She appears well-developed. On bariatric low pressure relief bed Super obesity; lying supine in bed; wearing shower/night cap;  NAD HENT: no O2 on this AM Head: Normocephalic.  Facial abrasisions esp on nose- healing but scarring lighter color than expected - skin dry on face- used vaseline on face Eyes: EOM are normal. Neck: No tracheal deviation present.  CV: RRR Respiratory: decreased breath sounds at bases-otherwise good air movement ; no coarse BS; B/L GI: Soft. NT, ND, protuberant, (+)BS Musculoskeletal:     Comments: LE edema  Neurological: She is alert.  Motor: RUE: shoulder abduction 2+/5, elbow flexion/extension 3-/5, hand grip 4-/5 LUE: shoulder abduction 2/5, elbow flexion/extension 2+/5, hand grip 3/5 B/l LE: HF, KE 3+/5, ADF 4-/5 Sensation intact to light touch  Skin: Skin is warm and dry.  Sacral ulcer -difficult to turn- will d/w nursing See above  Psychiatric: slightly flat affect    Assessment/Plan: 1. Functional deficits secondary to  critical illness myopathy which require 3+ hours per day of interdisciplinary therapy in a comprehensive inpatient rehab setting.  Physiatrist is providing close team supervision and 24 hour management of active medical problems listed below.  Physiatrist and rehab team continue to assess barriers to discharge/monitor patient progress toward functional and medical goals  Care Tool:  Bathing    Body parts bathed by patient: Abdomen, Face, Chest   Body parts bathed by helper: Right arm, Left arm, Chest, Abdomen, Front perineal area, Buttocks, Right upper leg, Left upper leg, Right lower leg, Left lower leg     Bathing assist Assist Level: Maximal Assistance - Patient 24 - 49%     Upper Body Dressing/Undressing Upper body dressing   What is the patient wearing?: Hospital gown only    Upper body assist Assist Level: Maximal Assistance - Patient 25 - 49%    Lower Body Dressing/Undressing Lower body dressing      What is the patient wearing?: Incontinence brief     Lower body assist Assist for lower body dressing: 2 Helpers     Toileting Toileting    Toileting assist Assist for toileting: 2 Helpers     Transfers Chair/bed transfer  Transfers assist  Chair/bed transfer activity did not occur: Safety/medical concerns  Chair/bed transfer assist level: Dependent - mechanical lift     Locomotion Ambulation   Ambulation assist   Ambulation activity did not occur: Safety/medical concerns  Walk 10 feet activity   Assist  Walk 10 feet activity did not occur: Safety/medical concerns        Walk 50 feet activity   Assist Walk 50 feet with 2 turns activity did not occur: Safety/medical concerns         Walk 150 feet activity   Assist Walk 150 feet activity did not occur: Safety/medical concerns         Walk 10 feet on uneven surface  activity   Assist Walk 10 feet on uneven surfaces activity did not occur: Safety/medical concerns          Wheelchair     Assist Will patient use wheelchair at discharge?: Yes(TBD) Type of Wheelchair: Manual(Per chart review LT goals set for dischrage )           Wheelchair 50 feet with 2 turns activity    Assist    Wheelchair 50 feet with 2 turns activity did not occur: Safety/medical concerns       Wheelchair 150 feet activity     Assist  Wheelchair 150 feet activity did not occur: Safety/medical concerns       Blood pressure 118/70, pulse 98, temperature 98.6 F (37 C), resp. rate 18, height 5\' 9"  (1.753 m), weight (!) 154.7 kg, last menstrual period 06/17/2012, SpO2 95 %.   Medical Problem List and Plan: 1.  Decreased functional mobility secondary to CIM due to acute hypoxemic respiratory failure due to ARDS/COVID-19 pneumonia.  Extubated 01/31/2019             -patient may shower             -ELOS/Goals: 27-32 days/Mod A            Continue CIR therapies.    -satting well on room air, changed O2 to PRN. 2.  Antithrombotics: -DVT/anticoagulation: Lovenox.  Venous Doppler studies negative             -antiplatelet therapy: Aspirin 81 mg daily 3. Pain Management: Tylenol as needed  2/5- added voltaren gel for L shoulder QID 4. Mood: Provide emotional support             -antipsychotic agents: N/A 5. Neuropsych: This patient is capable of making decisions on her own behalf. 6. Skin/Wound Care: Routine skin checks 7. Fluids/Electrolytes/Nutrition: Routine in and outs.  CMP ordered for 2/1  2/1- Hypokalemia- 3.1- will replete and recheck Wednesday  2/3- will recheck in AM  2/4- K+ 3.3- will replete 40 mEq x2 and recheck this weekend 8.  Acute on chronic diastolic congestive heart failure.  Echocardiogram ejection fraction 50%.  Monitor for any signs of fluid overload.  Continue Demadex 20 mg twice daily             Daily weights.   Filed Weights   02/15/19 0321 02/16/19 0450 02/17/19 0430  Weight: (!) 156 kg (!) 151 kg (!) 154.7 kg    2/1- weight  changed >30 lbs- will check into   2/2- same weight as yesterday.   2/3- no weight today  2/4- Weight 156k- up 1.3 kg- will monitor  2/5- weight back down to 151 kg- down 5 kg- unlikely  2/6- Weight 154.7 kg- makes more sense- will con't to monitor.  9.  Diabetes mellitus with peripheral neuropathy.  Hemoglobin A1c 7.1.  NovoLog 15 units 3 times daily with meals, Lantus insulin 25 units twice daily.  Check blood sugars before meals and at bedtime.  Diabetic teaching   CBG (  last 3)  Recent Labs    02/17/19 0419 02/17/19 0804 02/17/19 1145  GLUCAP 229* 218* 245*    2/1- just got Lantus increased yesterday- will look at trend in AM and titrate Lantus  2/2- Most BGs 200s- will increase Lantus to 30 units BID and monitor  2/3- place consult for DM coordinator since used to be on insulin pump at home- will see how they can help- I appreciate the assistance  2/4- increased Lantus to 35 units BID and Novolog to 16 units TID with meals. Family to bring in insulin pump Friday? If so, will call DM coordinator to help.  2/5- will get DM coordinator to help if going to use insulin pump.   2/6- DM coordinator increased Lantus to 38 units BID, checking BGs more frequently and Novolog 0-15 units             Monitor with increased mobility 10.  Cardiogenic shock.  Wean from pressors.  Monitor for orthostasis.  Continue Coreg 12.5 mg twice daily  2/4- will reduce to 6.25 mg BID since BP soft last night/this AM at 90s/50s 11.  Super obesity.  BMI 54.50.  Dietary follow-up.  Encourage weight loss 12.  Hyperlipidemia.  Crestor 13.  History of anemia.  Continue iron supplement.  Latest hemoglobin 11.6.             2/1- Hb 13.7 14.  Sickle cell trait.  Follow-up outpatient. 15. Constipation  2/2- will give Mg citrate and soap suds enema today to get pt cleaned out.   2/3- cleaned out per pt- will con't to monitor- and see if need to increase bowel meds. Will add Senokot 1 tab BID   2/4- LBM 2/4- will ask  nursing to give prns     LOS: 8 days A FACE TO FACE EVALUATION WAS PERFORMED  Emmalene Kattner 02/17/2019, 2:06 PM

## 2019-02-17 NOTE — Progress Notes (Signed)
Physical Therapy Weekly Progress Note  Patient Details  Name: Kaitlin [name] MRN: [id number] Date of Birth: 1965/06/17  Beginning of progress report period: February 10, 2019 End of progress report period: February 17, 2019  Today's Date: 02/17/2019 PT Individual Time: 1430-1530 PT Individual Time Calculation (min): 60 min   Patient has met 4 of 4 short term goals.  Pt has made great progress over the past week. She is able to complete rolling in bed with mod to max A x 1, is able to perform supine to/from sit with mod A x 2, is able to perform sit to stand to RW with assist x 2 from an elevated surface, and can complete short distance w/c mobility with mod A. Pt remains limited by severely decreased endurance, ongoing issues with fluctuations in blood sugar, and body habitus. Pt is extremely motivated and exhibits good participation in therapy sessions.  Patient continues to demonstrate the following deficits muscle weakness, decreased cardiorespiratoy endurance and decreased sitting balance, decreased standing balance, decreased postural control and decreased balance strategies and therefore will continue to benefit from skilled PT intervention to increase functional independence with mobility.  Patient progressing toward long term goals..  Continue plan of care.  PT Short Term Goals Week 1:  PT Short Term Goal 1 (Week 1): Pt will perform R/L rolling in bed with +2 mod assist PT Short Term Goal 1 - Progress (Week 1): Met PT Short Term Goal 2 (Week 1): Pt will perform supine<>sit with +2 mod assist PT Short Term Goal 2 - Progress (Week 1): Met PT Short Term Goal 3 (Week 1): Pt will initiate sit>stand using lift equipment as needed PT Short Term Goal 3 - Progress (Week 1): Met PT Short Term Goal 4 (Week 1): Pt will tolerate sitting OOB in w/c for at least 30 minutes PT Short Term Goal 4 - Progress (Week 1): Met Week 2:  PT Short Term Goal 1 (Week 2): Pt will complete bed mobility with assist x  1 PT Short Term Goal 2 (Week 2): Pt will complete stand pivot transfer with RW and assist x 2 PT Short Term Goal 3 (Week 2): Pt will ambulate x 5 ft with LRAD and assist x 2  Skilled Therapeutic Interventions/Progress Updates:    Pt received semi-reclined in bed, agreeable to PT session. No complaints of pain this date but does report high blood sugar. Rolling L/R for dependent donning of pants. Pt found to be incontinent of urine. Rolling L/R with mod to max A for dependent pericare, brief change, gown change, and to don pants. Supine to sit with mod A x 2. Pt is dependent to don TEDs and shoes while seated EOB. Sit to stand x 6 reps to RW from elevated bed with min to mod A x 2. Pt is able to take side steps towards [facility] with RW and assist x 2 for balance and RW management. Pt exhibits fear of LE giving out and taking steps but does well with encouragement. Pt tolerates standing about 30 sec at most. Seated balance EOB with close SBA while pt washes her face with setup A. Sit to supine assist x 2 for trunk control and BLE management. Pt left semi-reclined in bed with needs in reach at end of session.  Therapy Documentation Precautions:  Precautions Precautions: Fall Precaution Comments: morbid obesity, monitor SpO2 and HR Restrictions Weight Bearing Restrictions: No   Therapy/Group: Individual Therapy   Excell Seltzer, PT, DPT  02/17/2019, 3:32 PM

## 2019-02-17 NOTE — Progress Notes (Signed)
Inpatient Diabetes Program Recommendations  AACE/ADA: New Consensus Statement on Inpatient Glycemic Control (2015)  Target Ranges:  Prepandial:   less than 140 mg/dL      Peak postprandial:   less than 180 mg/dL (1-2 hours)      Critically ill patients:  140 - 180 mg/dL   Lab Results  Component Value Date   GLUCAP 218 (H) 02/17/2019   HGBA1C 7.1 (H) 01/13/2019    Review of Glycemic Control Results for [name], [name] (MRN [id number]) as of 02/17/2019 09:15  Ref. Range 02/16/2019 16:19 02/16/2019 20:00 02/16/2019 23:51 02/17/2019 04:19 02/17/2019 08:04  Glucose-Capillary Latest Ref Range: 70 - 99 mg/dL 184 (H) 210 (H) 120 (H) 229 (H) 218 (H)    Diabetes history: DM 2 Outpatient Diabetes medications: insulin pump, Metformin 1000 gm bid, Semaglutide 0.25 mg weekly  Medtronic insulin pump Average total insulin daily 152 units/day (basal insulin=78 units daily) 12 am - 2.5 units/hr 8 am- 3.5 units/hr 12 pm- 3.5 units/hr 4 pm- 3.75 units/hr 10 pm- 3.75 units/hr  Her insulin to carb ratio is now 1:5 and her sensitivity is 1:25.  Current orders for Inpatient glycemic control:  Lantus 38 units bid Novolog 0-20 units Q4 hours Novolog 16 units tid meal coverage  Inpatient Diabetes Program Recommendations:    Glucose trends improving. Noted second dose of Lantus 38 units given this am.  -  Consider Increasing Lantus to 42 units bid   Thanks, Tama Headings RN, MSN, BC-ADM Inpatient Diabetes Coordinator Team Pager [phone] (8a-5p)

## 2019-02-18 LAB — GLUCOSE, CAPILLARY
Glucose-Capillary: 205 mg/dL — ABNORMAL HIGH (ref 70–99)
Glucose-Capillary: 205 mg/dL — ABNORMAL HIGH (ref 70–99)
Glucose-Capillary: 221 mg/dL — ABNORMAL HIGH (ref 70–99)
Glucose-Capillary: 233 mg/dL — ABNORMAL HIGH (ref 70–99)
Glucose-Capillary: 234 mg/dL — ABNORMAL HIGH (ref 70–99)
Glucose-Capillary: 288 mg/dL — ABNORMAL HIGH (ref 70–99)

## 2019-02-18 MED ORDER — CARBAMIDE PEROXIDE 6.5 % OT SOLN
5.0000 [drp] | Freq: Two times a day (BID) | OTIC | Status: AC
  Administered 2019-02-18 – 2019-02-21 (×7): 5 [drp] via OTIC
  Filled 2019-02-18: qty 15

## 2019-02-18 MED ORDER — INSULIN GLARGINE 100 UNIT/ML ~~LOC~~ SOLN
42.0000 [IU] | Freq: Two times a day (BID) | SUBCUTANEOUS | Status: DC
  Administered 2019-02-18 – 2019-02-19 (×2): 42 [IU] via SUBCUTANEOUS
  Filled 2019-02-18 (×3): qty 0.42

## 2019-02-18 NOTE — Progress Notes (Signed)
Inpatient Diabetes Program Recommendations  AACE/ADA: New Consensus Statement on Inpatient Glycemic Control (2015)  Target Ranges:  Prepandial:   less than 140 mg/dL      Peak postprandial:   less than 180 mg/dL (1-2 hours)      Critically ill patients:  140 - 180 mg/dL   Lab Results  Component Value Date   GLUCAP 288 (H) 02/18/2019   HGBA1C 7.1 (H) 01/13/2019    Review of Glycemic Control Results for MITZE, DESHMUKH (MRN [id number]) as of 02/17/2019 09:15  Ref. Range 02/16/2019 16:19 02/16/2019 20:00 02/16/2019 23:51 02/17/2019 04:19 02/17/2019 08:04  Glucose-Capillary Latest Ref Range: 70 - 99 mg/dL 184 (H) 210 (H) 120 (H) 229 (H) 218 (H)   Results for NANNIE, SHERIDAN (MRN [id number]) as of 02/18/2019 09:13  Ref. Range 02/17/2019 08:04 02/17/2019 11:45 02/17/2019 16:12 02/17/2019 20:02 02/17/2019 23:58 02/18/2019 04:15 02/18/2019 07:47  Glucose-Capillary Latest Ref Range: 70 - 99 mg/dL 218 (H) 245 (H) 346 (H) 287 (H) 234 (H) 205 (H) 288 (H)   Diabetes history: DM 2 Outpatient Diabetes medications: insulin pump, Metformin 1000 gm bid, Semaglutide 0.25 mg weekly  Medtronic insulin pump Average total insulin daily 152 units/day (basal insulin=78 units daily) 12 am - 2.5 units/hr 8 am- 3.5 units/hr 12 pm- 3.5 units/hr 4 pm- 3.75 units/hr 10 pm- 3.75 units/hr  Her insulin to carb ratio is now 1:5 and her sensitivity is 1:25.  Current orders for Inpatient glycemic control:  Lantus 38 units bid Novolog 0-20 units Q4 hours Novolog 16 units tid meal coverage  Inpatient Diabetes Program Recommendations:    Glucose trends improving. Noted second dose of Lantus 38 units given this am.  -  Consider Increasing Lantus to 42 units bid   Thanks, Tama Headings RN, MSN, BC-ADM Inpatient Diabetes Coordinator Team Pager [phone] (8a-5p)

## 2019-02-18 NOTE — Progress Notes (Signed)
[place] PHYSICAL MEDICINE & REHABILITATION PROGRESS NOTE   Subjective/Complaints:   Pt reports has a bubbly/popping in her L ear- a little comfortable- but more weird feeling.  - hearing is NOT impaired.    ROS- pt denies SOB, CP, abd pain; N/V/C/D/ vision changes, Headache. Objective:   No results found. No results for input(s): WBC, HGB, HCT, PLT in the last 72 hours. No results for input(s): NA, K, CL, CO2, GLUCOSE, BUN, CREATININE, CALCIUM in the last 72 hours.  Intake/Output Summary (Last 24 hours) at 02/18/2019 1100 Last data filed at 02/17/2019 2238 Gross per 24 hour  Intake 120 ml  Output 450 ml  Net -330 ml     Physical Exam: Vital Signs Blood pressure 110/62, pulse 94, temperature 98.3 F (36.8 C), resp. rate 18, height 5\' 9"  (1.753 m), weight (!) 155.6 kg, last menstrual period 06/17/2012, SpO2 96 %. Vitals reviewed. Constitutional: She appears well-developed. On bariatric low pressure relief bed Super obese; lying supine in bed; wearing shower/night cap;  NAD HENT: no O2 on this AM; R ear has a tiny spot of dried blood in canal seen- maybe from scratching- good light reflex with no bulging of eardrum; L ear full of wax, but TM appears exact same as R- no bulging and good light reflex.  Head: Normocephalic.  Facial abrasisions esp on nose- healing but scarring lighter color than expected - skin dry on face- used vaseline on face Eyes: EOM are normal. Neck: No tracheal deviation present.  CV: RRR Respiratory: decreased breath sounds at bases-otherwise good air movement ; no coarse BS; B/L GI: Soft. NT, ND, protuberant, (+)BS Musculoskeletal:     Comments: LE edema  Neurological: She is alert.  Motor: RUE: shoulder abduction 2+/5, elbow flexion/extension 3-/5, hand grip 4-/5 LUE: shoulder abduction 2/5, elbow flexion/extension 2+/5, hand grip 3/5 B/l LE: HF, KE 3+/5, ADF 4-/5 Sensation intact to light touch  Skin: Skin is warm and dry.  Sacral ulcer -difficult  to turn- will d/w nursing See above  Psychiatric: slightly flat affect    Assessment/Plan: 1. Functional deficits secondary to critical illness myopathy which require 3+ hours per day of interdisciplinary therapy in a comprehensive inpatient rehab setting.  Physiatrist is providing close team supervision and 24 hour management of active medical problems listed below.  Physiatrist and rehab team continue to assess barriers to discharge/monitor patient progress toward functional and medical goals  Care Tool:  Bathing    Body parts bathed by patient: Abdomen, Face, Chest   Body parts bathed by helper: Right arm, Left arm, Chest, Abdomen, Front perineal area, Buttocks, Right upper leg, Left upper leg, Right lower leg, Left lower leg     Bathing assist Assist Level: Maximal Assistance - Patient 24 - 49%     Upper Body Dressing/Undressing Upper body dressing   What is the patient wearing?: Hospital gown only    Upper body assist Assist Level: Maximal Assistance - Patient 25 - 49%    Lower Body Dressing/Undressing Lower body dressing      What is the patient wearing?: Incontinence brief     Lower body assist Assist for lower body dressing: 2 Helpers     Toileting Toileting    Toileting assist Assist for toileting: 2 Helpers     Transfers Chair/bed transfer  Transfers assist  Chair/bed transfer activity did not occur: Safety/medical concerns  Chair/bed transfer assist level: Dependent - mechanical lift     Locomotion Ambulation   Ambulation assist   Ambulation  activity did not occur: Safety/medical concerns          Walk 10 feet activity   Assist  Walk 10 feet activity did not occur: Safety/medical concerns        Walk 50 feet activity   Assist Walk 50 feet with 2 turns activity did not occur: Safety/medical concerns         Walk 150 feet activity   Assist Walk 150 feet activity did not occur: Safety/medical concerns         Walk  10 feet on uneven surface  activity   Assist Walk 10 feet on uneven surfaces activity did not occur: Safety/medical concerns         Wheelchair     Assist Will patient use wheelchair at discharge?: Yes(TBD) Type of Wheelchair: Manual(Per chart review LT goals set for dischrage )           Wheelchair 50 feet with 2 turns activity    Assist    Wheelchair 50 feet with 2 turns activity did not occur: Safety/medical concerns       Wheelchair 150 feet activity     Assist  Wheelchair 150 feet activity did not occur: Safety/medical concerns       Blood pressure 110/62, pulse 94, temperature 98.3 F (36.8 C), resp. rate 18, height 5\' 9"  (1.753 m), weight (!) 155.6 kg, last menstrual period 06/17/2012, SpO2 96 %.   Medical Problem List and Plan: 1.  Decreased functional mobility secondary to CIM due to acute hypoxemic respiratory failure due to ARDS/COVID-19 pneumonia.  Extubated 01/31/2019             -patient may shower             -ELOS/Goals: 27-32 days/Mod A            Continue CIR therapies.    -satting well on room air, changed O2 to PRN. 2.  Antithrombotics: -DVT/anticoagulation: Lovenox.  Venous Doppler studies negative             -antiplatelet therapy: Aspirin 81 mg daily 3. Pain Management: Tylenol as needed  2/5- added voltaren gel for L shoulder QID 4. Mood: Provide emotional support             -antipsychotic agents: N/A 5. Neuropsych: This patient is capable of making decisions on her own behalf. 6. Skin/Wound Care: Routine skin checks 7. Fluids/Electrolytes/Nutrition: Routine in and outs.  CMP ordered for 2/1  2/1- Hypokalemia- 3.1- will replete and recheck Wednesday  2/3- will recheck in AM  2/4- K+ 3.3- will replete 40 mEq x2 and recheck this weekend 8.  Acute on chronic diastolic congestive heart failure.  Echocardiogram ejection fraction 50%.  Monitor for any signs of fluid overload.  Continue Demadex 20 mg twice daily             Daily  weights.   Filed Weights   02/16/19 0450 02/17/19 0430 02/18/19 0510  Weight: (!) 151 kg (!) 154.7 kg (!) 155.6 kg    2/1- weight changed >30 lbs- will check into   2/2- same weight as yesterday.   2/3- no weight today  2/4- Weight 156k- up 1.3 kg- will monitor  2/5- weight back down to 151 kg- down 5 kg- unlikely  2/6- Weight 154.7 kg- makes more sense- will con't to monitor.  9.  Diabetes mellitus with peripheral neuropathy.  Hemoglobin A1c 7.1.  NovoLog 15 units 3 times daily with meals, Lantus insulin 25 units twice daily.  Check blood sugars before meals and at bedtime.  Diabetic teaching   CBG (last 3)  Recent Labs    02/17/19 2358 02/18/19 0415 02/18/19 0747  GLUCAP 234* 205* 288*    2/1- just got Lantus increased yesterday- will look at trend in AM and titrate Lantus  2/2- Most BGs 200s- will increase Lantus to 30 units BID and monitor  2/3- place consult for DM coordinator since used to be on insulin pump at home- will see how they can help- I appreciate the assistance  2/4- increased Lantus to 35 units BID and Novolog to 16 units TID with meals. Family to bring in insulin pump Friday? If so, will call DM coordinator to help.  2/5- will get DM coordinator to help if going to use insulin pump.   2/6- DM coordinator increased Lantus to 38 units BID, checking BGs more frequently and Novolog 0-15 units  2/7- increased Lantus to 42 units BID as of this evening             Monitor with increased mobility 10.  Cardiogenic shock.  Wean from pressors.  Monitor for orthostasis.  Continue Coreg 12.5 mg twice daily  2/4- will reduce to 6.25 mg BID since BP soft last night/this AM at 90s/50s 11.  Super obesity.  BMI 54.50.  Dietary follow-up.  Encourage weight loss 12.  Hyperlipidemia.  Crestor 13.  History of anemia.  Continue iron supplement.  Latest hemoglobin 11.6.             2/1- Hb 13.7 14.  Sickle cell trait.  Follow-up outpatient. 15. Constipation  2/2- will give Mg citrate  and soap suds enema today to get pt cleaned out.   2/3- cleaned out per pt- will con't to monitor- and see if need to increase bowel meds. Will add Senokot 1 tab BID   2/4- LBM 2/4- will ask nursing to give prns  2/7- LBM yesterday afternon 16. L ear popping  2/7- will order Debrox drops for wax and see if helps. For L ear    LOS: 9 days A FACE TO FACE EVALUATION WAS PERFORMED  Veasna Santibanez 02/18/2019, 11:00 AM

## 2019-02-19 ENCOUNTER — Inpatient Hospital Stay (HOSPITAL_COMMUNITY): Payer: Medicaid Other

## 2019-02-19 ENCOUNTER — Inpatient Hospital Stay (HOSPITAL_COMMUNITY): Payer: Medicaid Other | Admitting: Physical Therapy

## 2019-02-19 LAB — COMPREHENSIVE METABOLIC PANEL
ALT: 30 U/L (ref 0–44)
AST: 23 U/L (ref 15–41)
Albumin: 3.3 g/dL — ABNORMAL LOW (ref 3.5–5.0)
Alkaline Phosphatase: 63 U/L (ref 38–126)
Anion gap: 17 — ABNORMAL HIGH (ref 5–15)
BUN: 14 mg/dL (ref 6–20)
CO2: 32 mmol/L (ref 22–32)
Calcium: 10.1 mg/dL (ref 8.9–10.3)
Chloride: 92 mmol/L — ABNORMAL LOW (ref 98–111)
Creatinine, Ser: 0.97 mg/dL (ref 0.44–1.00)
GFR calc Af Amer: >60 mL/min (ref >60)
GFR calc non Af Amer: >60 mL/min (ref >60)
Glucose, Bld: 181 mg/dL — ABNORMAL HIGH (ref 70–99)
Potassium: 3 mmol/L — ABNORMAL LOW (ref 3.5–5.1)
Sodium: 141 mmol/L (ref 135–145)
Total Bilirubin: 0.8 mg/dL (ref 0.3–1.2)
Total Protein: 6.4 g/dL — ABNORMAL LOW (ref 6.5–8.1)

## 2019-02-19 LAB — CBC WITH DIFFERENTIAL/PLATELET
Abs Immature Granulocytes: 0.01 10*3/uL (ref 0.00–0.07)
Basophils Absolute: 0 10*3/uL (ref 0.0–0.1)
Basophils Relative: 0 %
Eosinophils Absolute: 0 10*3/uL (ref 0.0–0.5)
Eosinophils Relative: 0 %
HCT: 42.7 % (ref 36.0–46.0)
Hemoglobin: 14.4 g/dL (ref 12.0–15.0)
Immature Granulocytes: 0 %
Lymphocytes Relative: 44 %
Lymphs Abs: 3.5 10*3/uL (ref 0.7–4.0)
MCH: 26.9 pg (ref 26.0–34.0)
MCHC: 33.7 g/dL (ref 30.0–36.0)
MCV: 79.7 fL — ABNORMAL LOW (ref 80.0–100.0)
Monocytes Absolute: 0.9 10*3/uL (ref 0.1–1.0)
Monocytes Relative: 11 %
Neutro Abs: 3.5 10*3/uL (ref 1.7–7.7)
Neutrophils Relative %: 45 %
Platelets: 309 10*3/uL (ref 150–400)
RBC: 5.36 MIL/uL — ABNORMAL HIGH (ref 3.87–5.11)
RDW: 18 % — ABNORMAL HIGH (ref 11.5–15.5)
WBC: 7.8 10*3/uL (ref 4.0–10.5)
nRBC: 0 % (ref 0.0–0.2)

## 2019-02-19 LAB — GLUCOSE, CAPILLARY
Glucose-Capillary: 176 mg/dL — ABNORMAL HIGH (ref 70–99)
Glucose-Capillary: 245 mg/dL — ABNORMAL HIGH (ref 70–99)
Glucose-Capillary: 256 mg/dL — ABNORMAL HIGH (ref 70–99)
Glucose-Capillary: 263 mg/dL — ABNORMAL HIGH (ref 70–99)
Glucose-Capillary: 285 mg/dL — ABNORMAL HIGH (ref 70–99)
Glucose-Capillary: 291 mg/dL — ABNORMAL HIGH (ref 70–99)
Glucose-Capillary: 353 mg/dL — ABNORMAL HIGH (ref 70–99)

## 2019-02-19 MED ORDER — POTASSIUM CHLORIDE 20 MEQ PO PACK
40.0000 meq | PACK | Freq: Two times a day (BID) | ORAL | Status: AC
  Administered 2019-02-19 (×2): 40 meq via ORAL
  Filled 2019-02-19 (×2): qty 2

## 2019-02-19 MED ORDER — INSULIN GLARGINE 100 UNIT/ML ~~LOC~~ SOLN
8.0000 [IU] | SUBCUTANEOUS | Status: AC
  Administered 2019-02-19: 11:00:00 8 [IU] via SUBCUTANEOUS
  Filled 2019-02-19: qty 0.08

## 2019-02-19 MED ORDER — INSULIN GLARGINE 100 UNIT/ML ~~LOC~~ SOLN
50.0000 [IU] | Freq: Two times a day (BID) | SUBCUTANEOUS | Status: DC
  Filled 2019-02-19: qty 0.5

## 2019-02-19 MED ORDER — LIDOCAINE 5 % EX PTCH
1.0000 | MEDICATED_PATCH | CUTANEOUS | Status: DC
  Administered 2019-02-19 – 2019-03-09 (×19): 1 via TRANSDERMAL
  Filled 2019-02-19 (×21): qty 1

## 2019-02-19 MED ORDER — INSULIN PUMP
SUBCUTANEOUS | Status: DC
  Administered 2019-02-19: 20:00:00 1.8 via SUBCUTANEOUS
  Administered 2019-02-19: 13:00:00 1 via SUBCUTANEOUS
  Administered 2019-02-20 (×2): 2.5 via SUBCUTANEOUS
  Administered 2019-02-21: 18:00:00 12.2 via SUBCUTANEOUS
  Administered 2019-02-21: 21:00:00 3.3 via SUBCUTANEOUS
  Administered 2019-02-21: 12.4 via SUBCUTANEOUS
  Administered 2019-02-21: 8 via SUBCUTANEOUS
  Administered 2019-02-23: 21:00:00 2.7 via SUBCUTANEOUS
  Administered 2019-02-24: 1 via SUBCUTANEOUS
  Administered 2019-03-01: 20:00:00 7 via SUBCUTANEOUS
  Administered 2019-03-01: 08:00:00 15.1 via SUBCUTANEOUS
  Administered 2019-03-01: 1 via SUBCUTANEOUS
  Administered 2019-03-01: 4 via SUBCUTANEOUS
  Administered 2019-03-02: 09:00:00 12.5 via SUBCUTANEOUS
  Administered 2019-03-02: 19.6 via SUBCUTANEOUS
  Administered 2019-03-02: 0.8 via SUBCUTANEOUS
  Administered 2019-03-03: 1.9 via SUBCUTANEOUS
  Administered 2019-03-03: 17:00:00 3 via SUBCUTANEOUS
  Administered 2019-03-03: 08:00:00 19.5 via SUBCUTANEOUS
  Administered 2019-03-04: 16.1 via SUBCUTANEOUS
  Administered 2019-03-05: 09:00:00 17.9 via SUBCUTANEOUS
  Administered 2019-03-07: 19 via SUBCUTANEOUS
  Filled 2019-02-19: qty 1

## 2019-02-19 NOTE — Plan of Care (Signed)
  Problem: Consults Goal: RH GENERAL PATIENT EDUCATION Description: See Patient Education module for education specifics. Outcome: Progressing Goal: Skin Care Protocol Initiated - if Braden Score 18 or less Description: If consults are not indicated, leave blank or document N/A Outcome: Progressing   Problem: RH BLADDER ELIMINATION Goal: RH STG MANAGE BLADDER WITH ASSISTANCE Description: STG Manage Bladder With mod Assistance Outcome: Progressing   Problem: RH SKIN INTEGRITY Goal: RH STG SKIN FREE OF INFECTION/BREAKDOWN Description: Pt will be free of skin breakdown or pressure ulcers prior to DC with mod assist Outcome: Progressing Goal: RH STG MAINTAIN SKIN INTEGRITY WITH ASSISTANCE Description: STG Maintain Skin Integrity With mod Assistance. Outcome: Progressing Goal: RH STG ABLE TO PERFORM INCISION/WOUND CARE W/ASSISTANCE Description: STG Able To Perform Incision/Wound Care With mod Assistance. Outcome: Progressing   Problem: RH SAFETY Goal: RH STG ADHERE TO SAFETY PRECAUTIONS W/ASSISTANCE/DEVICE Description: STG Adhere to Safety Precautions With mod Assistance/Device. Outcome: Progressing

## 2019-02-19 NOTE — Progress Notes (Signed)
Physical Therapy Session Note  Patient Details  Name: Kaitlin [name] MRN: [id number] Date of Birth: 1965-04-02  Today's Date: 02/19/2019 PT Individual Time: 1000-1100; 1315-1415 PT Individual Time Calculation (min): 60 min and 60 min PT Missed Time: 15 min Missed Time Reason: unavailable (pt just received lunch)  Short Term Goals: Week 2:  PT Short Term Goal 1 (Week 2): Pt will complete bed mobility with assist x 1 PT Short Term Goal 2 (Week 2): Pt will complete stand pivot transfer with RW and assist x 2 PT Short Term Goal 3 (Week 2): Pt will ambulate x 5 ft with LRAD and assist x 2  Skilled Therapeutic Interventions/Progress Updates:    Session 1: Pt received seated in recliner in room, reports feeling that her BG is elevated. PA and RN in room and aware of pt's symptoms, RN able to check BG, see RN note for details. PA also encouraging PO water intake. Due to elevated BG this session pt declines to attempt any standing activity, requests to return to bed. Pt able to perform seated BLE therex while seated in recliner: marches, LAQ x 10 reps each. Pt is also able to assist with changing pillow cases with use of BUE and chin to support pillows, setup A. Anterior and lateral leaning with min to CGA to place maxi sky sling. Maxi sky transfer back to bed. Rolling L/R with mod A for removal of sling. Pt is assist x 2 to scoot up in bed, able to assist with use of BLE. Supine BLE strengthening therex: heel slides, hip abd, SLR/SAQ x 10 reps each. Pt left semi-reclined in bed with needs in reach at end of session.  Session 2: Pt received seated in bed, just received lunch. Pt missed 20 min of scheduled therapy session at beginning due to late lunch arrival. Once pt done with lunch agreeable to PT session. Pt reports some abdominal pain and discomfort due to need to have a BM soon. Rolling L/R with mod A for dependent donning of pants. Supine to sitting EOB with assist x 2 with HOB elevated. Sit to stand  with assist x 2 to heavy duty RW. Stand pivot transfer bed to heavy duty BSC with heavy duty RW and assist x 2. Pt has increase in anxiety and RR in standing due to significant fear of falling. Provided encouragement and walked pt through breathing techniques while performing transfer. Pt is dependent for clothing management, able to continently void on BSC. Attempt sit to stand to stedy, pt unable to come to a stand. Sit to stand to heavy duty RW with assist x 2. Stand pivot transfer back to bed with RW and assist x 2, pt again with anxiety and increase in RR, provided encouragement. Pt able to remain standing x 1 min for pericare to be performed. Sit to supine assist x 2 for trunk control and BLE management. Rolling L/R with mod to max A for dependent pericare and donning of new brief. Pt left semi-reclined in bed with needs in reach at end of session.   Therapy Documentation Precautions:  Precautions Precautions: Fall Precaution Comments: morbid obesity, monitor SpO2 and HR Restrictions Weight Bearing Restrictions: No    Therapy/Group: Individual Therapy   Excell Seltzer, PT, DPT  02/19/2019, 12:47 PM

## 2019-02-19 NOTE — Progress Notes (Signed)
Pt stated " I feel as if My blood sugar is high, can we please check it?". Blood sugar was checked and read for 353 at 10:05am. Pam, Pa, was made aware. Ordered to give 4 units of novoLOG. Will check CBG again at 11:30am. Amanda Cockayne, LPN

## 2019-02-19 NOTE — Progress Notes (Signed)
Occupational Therapy Session Note  Patient Details  Name: HISAE COUGILL MRN: [id number] Date of Birth: 04-21-1965  Today's Date: 02/19/2019 OT Individual Time: [id number] OT Individual Time Calculation (min): 70 min    Short Term Goals: Week 2:  OT Short Term Goal 1 (Week 2): patient will complete rolling in bed with min A of one OT Short Term Goal 2 (Week 2): patient will complete sit to/from supine with mod A of one OT Short Term Goal 3 (Week 2): patient will complete UB bathing and dressing with mod A  Skilled Therapeutic Interventions/Progress Updates:    Pt resting in bed upon arrival.  Pt requested use of bed pan. Unfortunately, bed pan was not placed appropriately bed became soaked.  Pt completed rolling R<>L X 8 during session for hygiene, placement of bed pan, and placement of sling for MaxiSky transfer to recliner.  Pt able to assist in perihygiene this morning.  Pt also able to assist with pushing towards HO when BLE placed appropriately. Pt requires min A for rolling to R and mod A for rolling to L. Once in recliner, pt able to maintain unsupported sitting for approx 60 secs X 5 for grooming tasks in recliner. Pt able to scoot back into recliner without assitance. Pt remained in recliner with all needs within reach.  Therapy Documentation Precautions:  Precautions Precautions: Fall Precaution Comments: morbid obesity, monitor SpO2 and HR Restrictions Weight Bearing Restrictions: No Pain:  "I'm okay"   Therapy/Group: Individual Therapy  Leroy Libman 02/19/2019, 9:29 AM

## 2019-02-19 NOTE — Progress Notes (Addendum)
Inpatient Diabetes Program Recommendations  AACE/ADA: New Consensus Statement on Inpatient Glycemic Control (2015)  Target Ranges:  Prepandial:   less than 140 mg/dL      Peak postprandial:   less than 180 mg/dL (1-2 hours)      Critically ill patients:  140 - 180 mg/dL   Lab Results  Component Value Date   GLUCAP 353 (H) 02/19/2019   HGBA1C 7.1 (H) 01/13/2019    Review of Glycemic Control  Diabetes history: DM 2 Outpatient Diabetes medications: insulin pump, Metformin 1000 gm bid, Semaglutide 0.25 mg weekly  Medtronic insulin pump Average total insulin daily 152 units/day (basal insulin=78 units daily) 12 am - 2.5 units/hr 8 am- 3.5 units/hr 12 pm- 3.5 units/hr 4 pm- 3.75 units/hr 10 pm- 3.75 units/hr  Her insulin to carb ratio is now 1:5 and her sensitivity is 1:25.  Current orders for Inpatient glycemic control:  Lantus 38 units bid Novolog 0-20 units Q4 hours Novolog 16 units tid meal coverage  Inpatient Diabetes Program Recommendations:    Spoke with Olin Hauser, [place] about plan of care for pt. Discussed insulin pump use while on rehab. Pt is currently on more insulin than what her insulin pump provides.   Suggested to increase Lantus to 50 units bid. Additional 8 units ordered this am.  Will have to contact her Endocrinologist to increase her pump settings in order for pt to wear insulin pump.  1030 am: [facility] in [facility] Dr. Tamala Julian in regards to pt's insulin pump and setting changes. Waiting for call back.  Spoke with Dr.Smith, Pt's Endocrinologist. He recommend place pt  Back on her pump and her trends may surprise [place] and come down. Dr. Tamala Julian would  Be happy to assist with pump changes and possibly temporary basal rates if needed. Will follow pt closely while on insulin pump.  Thanks,  Tama Headings RN, MSN, BC-ADM Inpatient Diabetes Coordinator Team Pager [phone] (8a-5p)

## 2019-02-19 NOTE — Progress Notes (Signed)
[facility] PHYSICAL MEDICINE & REHABILITATION PROGRESS NOTE   Subjective/Complaints: [name] says she notes "flat things" coming from her nostrils. Upon further questioning, says they looks like scabs. Denies epistaxis. Complains of right shoulder pain. Sleeping well at night.    ROS- pt denies SOB, CP, abd pain; N/V/C/D/ vision changes, Headache. Objective:   No results found. Recent Labs    02/19/19 0522  WBC 7.8  HGB 14.4  HCT 42.7  PLT 309   Recent Labs    02/19/19 0522  NA 141  K 3.0*  CL 92*  CO2 32  GLUCOSE 181*  BUN 14  CREATININE 0.97  CALCIUM 10.1    Intake/Output Summary (Last 24 hours) at 02/19/2019 1205 Last data filed at 02/19/2019 1130 Gross per 24 hour  Intake 357 ml  Output 450 ml  Net -93 ml     Physical Exam: Vital Signs Blood pressure 106/61, pulse (!) 102, temperature 98.3 F (36.8 C), resp. rate 16, height 5\' 9"  (1.753 m), weight (!) 155.6 kg, last menstrual period 06/17/2012, SpO2 95 %. Vitals reviewed. Constitutional: She appears well-developed. On bariatric low pressure relief bed Super obese; lying supine in bed; wearing shower/night cap;  NAD HENT: no O2 on this AM; R ear has a tiny spot of dried blood in canal seen- maybe from scratching- good light reflex with no bulging of eardrum; L ear full of wax, but TM appears exact same as R- no bulging and good light reflex.  Head: Normocephalic.  Facial abrasisions esp on nose- healing but scarring lighter color than expected - skin dry on face- used vaseline on face Eyes: EOM are normal. Neck: No tracheal deviation present.  CV: RRR Respiratory: decreased breath sounds at bases-otherwise good air movement ; no coarse BS; B/L GI: Soft. NT, ND, protuberant, (+)BS Musculoskeletal:     Comments: LE edema  Neurological: She is alert.  Motor: RUE: shoulder abduction 2+/5, elbow flexion/extension 3-/5, hand grip 4-/5. Tenderness to palpation over right shoulder joint.  LUE: shoulder abduction  2/5, elbow flexion/extension 2+/5, hand grip 3/5 B/l LE: HF, KE 3+/5, ADF 4-/5 Sensation intact to light touch  Skin: Skin is warm and dry.  Sacral ulcer -difficult to turn- will d/w nursing See above  Psychiatric: slightly flat affect    Assessment/Plan: 1. Functional deficits secondary to critical illness myopathy which require 3+ hours per day of interdisciplinary therapy in a comprehensive inpatient rehab setting.  Physiatrist is providing close team supervision and 24 hour management of active medical problems listed below.  Physiatrist and rehab team continue to assess barriers to discharge/monitor patient progress toward functional and medical goals  Care Tool:  Bathing    Body parts bathed by patient: Abdomen, Face, Chest, Front perineal area   Body parts bathed by helper: Right arm, Left arm, Chest, Abdomen, Front perineal area, Buttocks, Right upper leg, Left upper leg, Right lower leg, Left lower leg     Bathing assist Assist Level: Maximal Assistance - Patient 24 - 49%     Upper Body Dressing/Undressing Upper body dressing   What is the patient wearing?: Hospital gown only    Upper body assist Assist Level: Maximal Assistance - Patient 25 - 49%    Lower Body Dressing/Undressing Lower body dressing      What is the patient wearing?: Incontinence brief     Lower body assist Assist for lower body dressing: 2 Helpers     Toileting Toileting    Toileting assist Assist for toileting: Maximal  Assistance - Patient 25 - 49%(perihygiene at bed level)     Transfers Chair/bed transfer  Transfers assist  Chair/bed transfer activity did not occur: Safety/medical concerns  Chair/bed transfer assist level: Dependent - mechanical lift     Locomotion Ambulation   Ambulation assist   Ambulation activity did not occur: Safety/medical concerns          Walk 10 feet activity   Assist  Walk 10 feet activity did not occur: Safety/medical concerns         Walk 50 feet activity   Assist Walk 50 feet with 2 turns activity did not occur: Safety/medical concerns         Walk 150 feet activity   Assist Walk 150 feet activity did not occur: Safety/medical concerns         Walk 10 feet on uneven surface  activity   Assist Walk 10 feet on uneven surfaces activity did not occur: Safety/medical concerns         Wheelchair     Assist Will patient use wheelchair at discharge?: Yes(TBD) Type of Wheelchair: Manual(Per chart review LT goals set for dischrage )           Wheelchair 50 feet with 2 turns activity    Assist    Wheelchair 50 feet with 2 turns activity did not occur: Safety/medical concerns       Wheelchair 150 feet activity     Assist  Wheelchair 150 feet activity did not occur: Safety/medical concerns       Blood pressure 106/61, pulse (!) 102, temperature 98.3 F (36.8 C), resp. rate 16, height 5\' 9"  (1.753 m), weight (!) 155.6 kg, last menstrual period 06/17/2012, SpO2 95 %.   Medical Problem List and Plan: 1.  Decreased functional mobility secondary to CIM due to acute hypoxemic respiratory failure due to ARDS/COVID-19 pneumonia.  Extubated 01/31/2019             -patient may shower             -ELOS/Goals: 27-32 days/Mod A            Continue CIR therapies.    -satting well on room air, changed O2 to PRN. 2.  Antithrombotics: -DVT/anticoagulation: Lovenox.  Venous Doppler studies negative             -antiplatelet therapy: Aspirin 81 mg daily 3. Pain Management: Tylenol as needed  2/5- added voltaren gel for L shoulder QID  2/8: added lidocaine patch for right shoulder.  4. Mood: Provide emotional support             -antipsychotic agents: N/A 5. Neuropsych: This patient is capable of making decisions on her own behalf. 6. Skin/Wound Care: Routine skin checks  2/8: facial abraisons healing well.  7. Fluids/Electrolytes/Nutrition: Routine in and outs.  CMP ordered for 2/1  2/1-  Hypokalemia- 3.1- will replete and recheck Wednesday  2/3- will recheck in AM  2/4- K+ 3.3- will replete 40 mEq x2 and recheck this weekend  2/8: K+ 3.0; ordered 33meq x2 today with repeat BMP tomorrow morning.  8.  Acute on chronic diastolic congestive heart failure.  Echocardiogram ejection fraction 50%.  Monitor for any signs of fluid overload.  Continue Demadex 20 mg twice daily             Daily weights.   Filed Weights   02/17/19 0430 02/18/19 0510 02/19/19 0412  Weight: (!) 154.7 kg (!) 155.6 kg (!) 155.6 kg  2/1- weight changed >30 lbs- will check into   2/2- same weight as yesterday.   2/3- no weight today  2/4- Weight 156k- up 1.3 kg- will monitor  2/5- weight back down to 151 kg- down 5 kg- unlikely  2/6- Weight 154.7 kg- makes more sense- will con't to monitor.   2/8: weight stable.  9.  Diabetes mellitus with peripheral neuropathy.  Hemoglobin A1c 7.1.  NovoLog 15 units 3 times daily with meals, Lantus insulin 25 units twice daily.  Check blood sugars before meals and at bedtime.  Diabetic teaching   CBG (last 3)  Recent Labs    02/19/19 0754 02/19/19 1005 02/19/19 1135  GLUCAP 285* 353* 291*    2/1- just got Lantus increased yesterday- will look at trend in AM and titrate Lantus  2/2- Most BGs 200s- will increase Lantus to 30 units BID and monitor  2/3- place consult for DM coordinator since used to be on insulin pump at home- will see how they can help- I appreciate the assistance  2/4- increased Lantus to 35 units BID and Novolog to 16 units TID with meals. Family to bring in insulin pump Friday? If so, will call DM coordinator to help.  2/5- will get DM coordinator to help if going to use insulin pump.   2/6- DM coordinator increased Lantus to 38 units BID, checking BGs more frequently and Novolog 0-15 units  2/7- increased Lantus to 42 units BID as of this evening  2/8: remain elevated but with improved control from yesterday.              Monitor with  increased mobility 10.  Cardiogenic shock.  Wean from pressors.  Monitor for orthostasis.  Continue Coreg 12.5 mg twice daily  2/4- will reduce to 6.25 mg BID since BP soft last night/this AM at 90s/50s 11.  Super obesity.  BMI 54.50.  Dietary follow-up.  Encourage weight loss 12.  Hyperlipidemia.  Crestor 13.  History of anemia.  Continue iron supplement.  Latest hemoglobin 11.6.             2/1- Hb 13.7 14.  Sickle cell trait.  Follow-up outpatient. 15. Constipation  2/2- will give Mg citrate and soap suds enema today to get pt cleaned out.   2/3- cleaned out per pt- will con't to monitor- and see if need to increase bowel meds. Will add Senokot 1 tab BID   2/4- LBM 2/4- will ask nursing to give prns  2/7- LBM yesterday afternon 16. L ear popping  2/7- will order Debrox drops for wax and see if helps. For L ear    LOS: 10 days A FACE TO FACE EVALUATION WAS PERFORMED  Kaitlin [name] Kaitlin [name] 02/19/2019, 12:05 PM

## 2019-02-20 ENCOUNTER — Inpatient Hospital Stay (HOSPITAL_COMMUNITY): Payer: Medicaid Other

## 2019-02-20 ENCOUNTER — Inpatient Hospital Stay (HOSPITAL_COMMUNITY): Payer: Medicaid Other | Admitting: Physical Therapy

## 2019-02-20 LAB — BASIC METABOLIC PANEL
Anion gap: 17 — ABNORMAL HIGH (ref 5–15)
BUN: 17 mg/dL (ref 6–20)
CO2: 28 mmol/L (ref 22–32)
Calcium: 10 mg/dL (ref 8.9–10.3)
Chloride: 94 mmol/L — ABNORMAL LOW (ref 98–111)
Creatinine, Ser: 0.9 mg/dL (ref 0.44–1.00)
GFR calc Af Amer: >60 mL/min (ref >60)
GFR calc non Af Amer: >60 mL/min (ref >60)
Glucose, Bld: 102 mg/dL — ABNORMAL HIGH (ref 70–99)
Potassium: 3 mmol/L — ABNORMAL LOW (ref 3.5–5.1)
Sodium: 139 mmol/L (ref 135–145)

## 2019-02-20 LAB — GLUCOSE, CAPILLARY
Glucose-Capillary: 109 mg/dL — ABNORMAL HIGH (ref 70–99)
Glucose-Capillary: 150 mg/dL — ABNORMAL HIGH (ref 70–99)
Glucose-Capillary: 188 mg/dL — ABNORMAL HIGH (ref 70–99)
Glucose-Capillary: 209 mg/dL — ABNORMAL HIGH (ref 70–99)
Glucose-Capillary: 216 mg/dL — ABNORMAL HIGH (ref 70–99)
Glucose-Capillary: 221 mg/dL — ABNORMAL HIGH (ref 70–99)
Glucose-Capillary: 248 mg/dL — ABNORMAL HIGH (ref 70–99)

## 2019-02-20 MED ORDER — POTASSIUM CHLORIDE CRYS ER 20 MEQ PO TBCR
40.0000 meq | EXTENDED_RELEASE_TABLET | Freq: Three times a day (TID) | ORAL | Status: AC
  Administered 2019-02-20 (×3): 40 meq via ORAL
  Filled 2019-02-20 (×5): qty 2

## 2019-02-20 MED ORDER — MAGNESIUM OXIDE 400 (241.3 MG) MG PO TABS
400.0000 mg | ORAL_TABLET | Freq: Two times a day (BID) | ORAL | Status: DC
  Administered 2019-02-20 – 2019-03-10 (×37): 400 mg via ORAL
  Filled 2019-02-20 (×37): qty 1

## 2019-02-20 NOTE — Progress Notes (Signed)
Physical Therapy Session Note  Patient Details  Name: Kaitlin [name] MRN: [id number] Date of Birth: 1965/09/28  Today's Date: 02/20/2019 PT Individual Time: 1315-1420 PT Individual Time Calculation (min): 65 min   Short Term Goals: Week 2:  PT Short Term Goal 1 (Week 2): Pt will complete bed mobility with assist x 1 PT Short Term Goal 2 (Week 2): Pt will complete stand pivot transfer with RW and assist x 2 PT Short Term Goal 3 (Week 2): Pt will ambulate x 5 ft with LRAD and assist x 2  Skilled Therapeutic Interventions/Progress Updates: Pt presented in bed completing lunch and agreeable to therapy once completed meal. Session focused on standing tolerance and bed mobility. PTA threaded pants and pt performed rolling L/R with modA x 2 to pull pants over hips. Performed supine to sit with modA x 1 with use of bed features and increased time. Pt was able to scoot to EOB with supervision and use of BUE. Pt then participated in STS modA x 2 (2 trials) with PTA distracting pt (having pt talk about self and grandchildren) to increase standing time. Pt was able to tolerate 1 min and 1:06 respectively with increased time for sitting rests. Pt performed third STS with minA x 2 and take lateral steps to [facility] CGA (x 4 steps). After recovery time pt performed LAQ and manually resisted hamstring pulls x 10 bilaterally. Attempted seated cycle but pt unable to tolerate positioning. Pt performed sit to supine with modA and HOB elevated and was able to perform lateral scoots with modA to reposition in bed. Performed anterior scoot to [facility] with modA and use of headboard. Pt repositioned to comfort and left with call bell within reach and needs met.      Therapy Documentation Precautions:  Precautions Precautions: Fall Precaution Comments: morbid obesity, monitor SpO2 and HR Restrictions Weight Bearing Restrictions: No   Therapy/Group: Individual Therapy  Bluford Sedler  Tamalyn Wadsworth, PTA  02/20/2019, 3:53  PM

## 2019-02-20 NOTE — Progress Notes (Signed)
[facility] PHYSICAL MEDICINE & REHABILITATION PROGRESS NOTE   Subjective/Complaints:   Pt reports L ear still "bubbling".   Back to insulin pump- current BG 224.    ROS- pt denies SOB, CP, abd pain; N/V/C/D/ vision changes, Headache. Objective:   No results found. Recent Labs    02/19/19 0522  WBC 7.8  HGB 14.4  HCT 42.7  PLT 309   Recent Labs    02/19/19 0522 02/20/19 0541  NA 141 139  K 3.0* 3.0*  CL 92* 94*  CO2 32 28  GLUCOSE 181* 102*  BUN 14 17  CREATININE 0.97 0.90  CALCIUM 10.1 10.0    Intake/Output Summary (Last 24 hours) at 02/20/2019 0941 Last data filed at 02/20/2019 0720 Gross per 24 hour  Intake 702 ml  Output 1175 ml  Net -473 ml     Physical Exam: Vital Signs Blood pressure 111/63, pulse 98, temperature 98.3 F (36.8 C), resp. rate 20, height 5\' 9"  (1.753 m), weight (!) 158.8 kg, last menstrual period 06/17/2012, SpO2 98 %. Vitals reviewed. Constitutional: She appears well-developed. On bariatric low pressure relief bed Super obese; lying supine in bed; wearing shower/night cap;  NAD HENT: no O2 on this AM; R ear has a tiny spot of dried blood in canal seen- maybe from scratching- good light reflex with no bulging of eardrum; L ear full of wax, but TM appears exact same as R- no bulging and good light reflex.  Head: Normocephalic.  Facial abrasisions esp on nose- healing but scarring lighter color than expected - skin dry on face- used vaseline on face Eyes: EOM are normal. Neck: No tracheal deviation present.  CV: RRR Respiratory: decreased breath sounds at bases-otherwise good air movement ; no coarse BS; B/L GI: Soft. NT, ND, protuberant, (+)BS Musculoskeletal:     Comments: LE edema  Neurological: She is alert.  Motor: RUE: shoulder abduction 2+/5, elbow flexion/extension 3-/5, hand grip 4-/5. Tenderness to palpation over right shoulder joint.  LUE: shoulder abduction 2/5, elbow flexion/extension 2+/5, hand grip 3/5 B/l LE: HF, KE  3+/5, ADF 4-/5 Sensation intact to light touch  Skin: Skin is warm and dry.  Sacral ulcer -difficult to turn- will d/w nursing See above  Psychiatric: slightly flat affect    Assessment/Plan: 1. Functional deficits secondary to critical illness myopathy which require 3+ hours per day of interdisciplinary therapy in a comprehensive inpatient rehab setting.  Physiatrist is providing close team supervision and 24 hour management of active medical problems listed below.  Physiatrist and rehab team continue to assess barriers to discharge/monitor patient progress toward functional and medical goals  Care Tool:  Bathing    Body parts bathed by patient: Abdomen, Face, Chest, Front perineal area   Body parts bathed by helper: Right arm, Left arm, Chest, Abdomen, Front perineal area, Buttocks, Right upper leg, Left upper leg, Right lower leg, Left lower leg     Bathing assist Assist Level: Maximal Assistance - Patient 24 - 49%     Upper Body Dressing/Undressing Upper body dressing   What is the patient wearing?: Hospital gown only    Upper body assist Assist Level: Maximal Assistance - Patient 25 - 49%    Lower Body Dressing/Undressing Lower body dressing      What is the patient wearing?: Incontinence brief     Lower body assist Assist for lower body dressing: 2 Helpers     Toileting Toileting    Toileting assist Assist for toileting: Maximal Assistance - Patient 25 -  49%(perihygiene at bed level)     Transfers Chair/bed transfer  Transfers assist  Chair/bed transfer activity did not occur: Safety/medical concerns  Chair/bed transfer assist level: 2 Helpers     Locomotion Ambulation   Ambulation assist   Ambulation activity did not occur: Safety/medical concerns          Walk 10 feet activity   Assist  Walk 10 feet activity did not occur: Safety/medical concerns        Walk 50 feet activity   Assist Walk 50 feet with 2 turns activity did not  occur: Safety/medical concerns         Walk 150 feet activity   Assist Walk 150 feet activity did not occur: Safety/medical concerns         Walk 10 feet on uneven surface  activity   Assist Walk 10 feet on uneven surfaces activity did not occur: Safety/medical concerns         Wheelchair     Assist Will patient use wheelchair at discharge?: Yes(TBD) Type of Wheelchair: Manual(Per chart review LT goals set for dischrage )           Wheelchair 50 feet with 2 turns activity    Assist    Wheelchair 50 feet with 2 turns activity did not occur: Safety/medical concerns       Wheelchair 150 feet activity     Assist  Wheelchair 150 feet activity did not occur: Safety/medical concerns       Blood pressure 111/63, pulse 98, temperature 98.3 F (36.8 C), resp. rate 20, height 5\' 9"  (1.753 m), weight (!) 158.8 kg, last menstrual period 06/17/2012, SpO2 98 %.   Medical Problem List and Plan: 1.  Decreased functional mobility secondary to CIM due to acute hypoxemic respiratory failure due to ARDS/COVID-19 pneumonia.  Extubated 01/31/2019             -patient may shower             -ELOS/Goals: 27-32 days/Mod A            Continue CIR therapies.    -satting well on room air, changed O2 to PRN. 2.  Antithrombotics: -DVT/anticoagulation: Lovenox.  Venous Doppler studies negative             -antiplatelet therapy: Aspirin 81 mg daily 3. Pain Management: Tylenol as needed  2/5- added voltaren gel for L shoulder QID  2/8: added lidocaine patch for right shoulder.  4. Mood: Provide emotional support             -antipsychotic agents: N/A 5. Neuropsych: This patient is capable of making decisions on her own behalf. 6. Skin/Wound Care: Routine skin checks  2/8: facial abraisons healing well.  7. Fluids/Electrolytes/Nutrition: Routine in and outs.  CMP ordered for 2/1  2/1- Hypokalemia- 3.1- will replete and recheck Wednesday  2/3- will recheck in AM  2/4- K+  3.3- will replete 40 mEq x2 and recheck this weekend  2/8: K+ 3.0; ordered 45meq x2 today with repeat BMP tomorrow   morning.   2/9- K+ still 3.0- will check Mg level; start Magnesium Oxide 400 mg BID as well as K+ replete x3 40 mEq and recheck in AM 8.  Acute on chronic diastolic congestive heart failure.  Echocardiogram ejection fraction 50%.  Monitor for any signs of fluid overload.  Continue Demadex 20 mg twice daily             Daily weights.   Filed Weights  02/18/19 0510 02/19/19 0412 02/20/19 0413  Weight: (!) 155.6 kg (!) 155.6 kg (!) 158.8 kg    2/1- weight changed >30 lbs- will check into   2/2- same weight as yesterday.   2/3- no weight today  2/4- Weight 156k- up 1.3 kg- will monitor  2/5- weight back down to 151 kg- down 5 kg- unlikely  2/6- Weight 154.7 kg- makes more sense- will con't to monitor.   2/8: weight stable.  2/9- Weight 158.8 kg- up 2 kg? Will monitor for trend, since might not be that high  9.  Diabetes mellitus with peripheral neuropathy.  Hemoglobin A1c 7.1.  NovoLog 15 units 3 times daily with meals, Lantus insulin 25 units twice daily.  Check blood sugars before meals and at bedtime.  Diabetic teaching   CBG (last 3)  Recent Labs    02/20/19 0010 02/20/19 0409 02/20/19 0743  GLUCAP 150* 109* 209*    2/1- just got Lantus increased yesterday- will look at trend in AM and titrate Lantus  2/2- Most BGs 200s- will increase Lantus to 30 units BID and monitor  2/3- place consult for DM coordinator since used to be on insulin pump at home- will see how they can help- I appreciate the assistance  2/4- increased Lantus to 35 units BID and Novolog to 16 units TID with meals. Family to bring in insulin pump Friday? If so, will call DM coordinator to help.  2/5- will get DM coordinator to help if going to use insulin pump.   2/6- DM coordinator increased Lantus to 38 units BID, checking BGs more frequently and Novolog 0-15 units  2/7- increased Lantus to 42  units BID as of this evening  2/8: remain elevated but with improved control from yesterday.   2/9- back in insulin pump- will monitor             Monitor with increased mobility 10.  Cardiogenic shock.  Wean from pressors.  Monitor for orthostasis.  Continue Coreg 12.5 mg twice daily  2/4- will reduce to 6.25 mg BID since BP soft last night/this AM at 90s/50s 11.  Super obesity.  BMI 54.50.  Dietary follow-up.  Encourage weight loss 12.  Hyperlipidemia.  Crestor 13.  History of anemia.  Continue iron supplement.  Latest hemoglobin 11.6.             2/1- Hb 13.7 14.  Sickle cell trait.  Follow-up outpatient. 15. Constipation  2/2- will give Mg citrate and soap suds enema today to get pt cleaned out.   2/3- cleaned out per pt- will con't to monitor- and see if need to increase bowel meds. Will add Senokot 1 tab BID   2/4- LBM 2/4- will ask nursing to give prns  2/7- LBM yesterday afternon 16. L ear popping  2/7- will order Debrox drops for wax and see if helps. For L ear    LOS: 11 days A FACE TO FACE EVALUATION WAS PERFORMED  Mignon Bechler 02/20/2019, 9:41 AM

## 2019-02-20 NOTE — Patient Care Conference (Signed)
Inpatient RehabilitationTeam Conference and Plan of Care Update Date: 02/20/2019   Time: 11:00 AM    Patient Name: Kaitlin [name]      Medical Record Number: [id number]  Date of Birth: [birth year] Sex: Female         Room/Bed: 4W14C/4W14C-01 Payor Info: Payor: MEDICAID  / Plan: MEDICAID [place] ACCESS / Product Type: *No Product type* /    Admit Date/Time:  02/09/2019  2:25 PM  Primary Diagnosis:  Critical illness myopathy  Patient Active Problem List   Diagnosis Date Noted  . 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 COVID-19   . History of anemia   . Super obese   . Poorly controlled type 2 diabetes mellitus with peripheral neuropathy ([place])   . Acute on chronic diastolic (congestive) heart failure ([place])   . Hypernatremia   . Shock circulatory ([place])   . Septic shock ([place])   . Hypotension 01/13/2019  . Acute respiratory failure with hypoxemia ([place]) 01/13/2019  . Acute respiratory distress syndrome (ARDS) due to COVID-19 virus ([place]) 01/12/2019  . Grade I diastolic dysfunction [id number]  . 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 Dyspnea on exertion 05/19/2016  . Other fatigue 05/19/2016  . Absolute anemia 04/15/2016  . Left leg weakness 10/18/2014  . Left leg paresthesias 10/18/2014  . Acute left-sided weakness 10/18/2014  . Encounter for central line placement 03/08/2014  . Taking drug for chronic disease 03/08/2014  . H/O insertion of insulin pump 02/18/2014  . Long term current use of insulin ([place]) 02/18/2014  . Insulin pump in place 02/18/2014  . 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 Abnormal ballistocardiogram 05/18/2013  . Abnormal nuclear stress test 05/18/2013  . 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 NICM (nonischemic cardiomyopathy) ([place]) 05/01/2013  . Insulin dependent type 2 diabetes mellitus ([place]) 04/10/2013  . Sex counseling 04/10/2013  . Edema leg 04/10/2013  . Upper respiratory infection, viral 04/10/2013  . Pedal edema 03/29/2013  . Pneumonia 12/12/2012  .  Essential hypertension 12/12/2012  . HLD (hyperlipidemia) 12/12/2012  . Tonsillar mass 12/01/2012  . 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 Leukocytosis   . Anemia   . High cholesterol   . Hypertension   . Blurred vision 09/11/2011  . Foot pain 09/11/2011  . Breast pain 09/11/2011  . Morbid obesity ([place]) 09/11/2011  . Fungal infection of nail 09/11/2011  . Avitaminosis D 09/11/2011    Expected Discharge Date: Expected Discharge Date: 03/10/19  Team Members Present: Physician leading conference: Dr. Courtney Heys Social Worker Present: Lennart Pall, LCSW Nurse Present: Other (comment)(Inka [place], LPN) Case Manager: Karene Fry, RN PT Present: Excell Seltzer, PT OT Present: Roanna Epley, [place], OT SLP Present: Jettie Booze, CF-SLP PPS Coordinator present : Ileana Ladd, Burna [place], SLP     Current Status/Progress Goal Weekly Team Focus  Bowel/Bladder   Pt continent of bowel and bladder, taking senkot, LBM 02/19/19  Maintain continence  assess bowel and bladder q shift and prn    Swallow/Nutrition/ Hydration             ADL's   bed mobility, bathing-tot A; tranfsers with MaxiSky, sitting balance-min A  min A overall, mod A bathing  activity tolerance, BADL training, BUE function, BUE strengthening,   Mobility   mod to max A rolling, +2 supine to/from sit, +2 sit to stand to RW, +2 SPT with RW, otherwise transfers with maxi sky  mod A overall  sit to stand, standing tolerance, gait initiation, endurance  Communication             Safety/Cognition/ Behavioral Observations            Pain   no complaints opf pain at this time   to remain pain free  assess pain q shift and prn   Skin   Three blister on black (healing), skin tear on coccyx, preassure injury on nose healing  to heal current wounds and not gain any new wounds   assess skin q shift, applly foams to current wounds, assess orn     Rehab Goals Patient on target to meet  rehab goals: Yes *See Care Plan and progress notes for long and short-term goals.     Barriers to Discharge  Current Status/Progress Possible Resolutions Date Resolved   Nursing                  PT  Incontinence;Weight                 OT                  SLP                SW                Discharge Planning/Teaching Needs:  Pt to d/c home with another daughter with home having better w/c accessibility.  3 adult children to share in providing 24/7 assistance.  Teaching needs still TBD   Team Discussion: Neuropathy, DM, insulin pump, L ear "bubbling", bariatric mattress, hypokalemia, meds adjusted, labs tomorrow.  RN BS 209, L ear drops.  OT bed mobility and bathing tot A, transfers with maxisky.  PT slow progress, +2 supine to stand, transfers +2 with walker, anxiety.  No SLP   Revisions to Treatment Plan: N/A     Medical Summary Current Status: sacral decub- Stage II  LBM 2/8; continent Weekly Focus/Goal: A of 2; rolling mod-max assist; standing to RW; assist of 2 to RW: stand pivot transfer with RW A of 2  Barriers to Discharge: Behavior;Other (comments);Decreased family/caregiver support;Home enviroment access/layout;Medical stability;Wound care  Barriers to Discharge Comments: anxiety; insulin pump restarted; sacral decub Possible Resolutions to Barriers: LUE doing better; still pain in L shoulder; could clean self some after voiding yesterday   Continued Need for Acute Rehabilitation Level of Care: The patient requires daily medical management by a physician with specialized training in physical medicine and rehabilitation for the following reasons: Direction of a multidisciplinary physical rehabilitation program to maximize functional independence : Yes Medical management of patient stability for increased activity during participation in an intensive rehabilitation regime.: Yes Analysis of laboratory values and/or radiology reports with any subsequent need for medication  adjustment and/or medical intervention. : Yes   I attest that I was present, lead the team conference, and concur with the assessment and plan of the team.   Retta Diones 02/20/2019, 5:34 PM   Team conference was held via web/ teleconference due to [place] - 19

## 2019-02-20 NOTE — Progress Notes (Signed)
Occupational Therapy Session Note  Patient Details  Name: Kaitlin [name] MRN: [id number] Date of Birth: 24-May-1965  Today's Date: 02/20/2019 OT Individual Time: 1000-1055 OT Individual Time Calculation (min): 55 min    Short Term Goals: Week 2:  OT Short Term Goal 1 (Week 2): patient will complete rolling in bed with min A of one OT Short Term Goal 2 (Week 2): patient will complete sit to/from supine with mod A of one OT Short Term Goal 3 (Week 2): patient will complete UB bathing and dressing with mod A  Skilled Therapeutic Interventions/Progress Updates:    1:1 NMES applied to LUE anterior and middle deltoid to facilitate shoulder flexion Ratio 1:3 Rate 35 pps Waveform- Asymmetric Ramp 1.0 Pulse 300 Intensity- 23 Duration -  10 mins   Minimal functional improvement noted but pt able to reach L ear after NMES.  No adverse reactions after treatment and is skin intact.   Pt resting in recliner upon arrival. OT intervention with focus on NMES (per above) and BUE PROM/AAROM/AROM with emphasis on shoulder flexion and adduction. Improvement noted with shoulder flexion R>L but continues to require max A for flexion.  Pt requested to return to bed to use bedpan. Transfer with MaxiSky +2. Pt remained in bed with NT present.  Therapy Documentation Precautions:  Precautions Precautions: Fall Precaution Comments: morbid obesity, monitor SpO2 and HR Restrictions Weight Bearing Restrictions: No   Pain:  Pt c/o BUE/shoulder soreness/discomfort with activity; repositioned  Therapy/Group: Individual Therapy  Leroy Libman 02/20/2019, 11:24 AM

## 2019-02-20 NOTE — Plan of Care (Signed)
  Problem: Consults Goal: RH GENERAL PATIENT EDUCATION Description: See Patient Education module for education specifics. Outcome: Progressing Goal: Skin Care Protocol Initiated - if Braden Score 18 or less Description: If consults are not indicated, leave blank or document N/A Outcome: Progressing   Problem: RH BLADDER ELIMINATION Goal: RH STG MANAGE BLADDER WITH ASSISTANCE Description: STG Manage Bladder With mod Assistance Outcome: Progressing   Problem: RH SKIN INTEGRITY Goal: RH STG SKIN FREE OF INFECTION/BREAKDOWN Description: Pt will be free of skin breakdown or pressure ulcers prior to DC with mod assist Outcome: Progressing Goal: RH STG MAINTAIN SKIN INTEGRITY WITH ASSISTANCE Description: STG Maintain Skin Integrity With mod Assistance. Outcome: Progressing Goal: RH STG ABLE TO PERFORM INCISION/WOUND CARE W/ASSISTANCE Description: STG Able To Perform Incision/Wound Care With mod Assistance. Outcome: Progressing   Problem: RH SAFETY Goal: RH STG ADHERE TO SAFETY PRECAUTIONS W/ASSISTANCE/DEVICE Description: STG Adhere to Safety Precautions With mod Assistance/Device. Outcome: Progressing

## 2019-02-20 NOTE — Progress Notes (Signed)
Physical Therapy Session Note  Patient Details  Name: Kaitlin [name] MRN: [id number] Date of Birth: [birth year]  Today's Date: 02/20/2019 PT Individual Time: 0800-0910 PT Individual Time Calculation (min): 70 min   Short Term Goals: Week 2:  PT Short Term Goal 1 (Week 2): Pt will complete bed mobility with assist x 1 PT Short Term Goal 2 (Week 2): Pt will complete stand pivot transfer with RW and assist x 2 PT Short Term Goal 3 (Week 2): Pt will ambulate x 5 ft with LRAD and assist x 2  Skilled Therapeutic Interventions/Progress Updates:    Pt received seated in bed, agreeable to PT session. No complaints of pain this AM. Pt is max A for UB and LB bathing at bed level, max A to doff gown and don new gown, dependent for brief change. Rolling L/R with mod to max A for dependent donning of pants, dependent to don TED hose. Supine to sit with mod A +2 and HOB elevated. Dependent to don shoes while seated EOB with SBA for sitting balance. Sit to stand with mod to max +2 to stand from elevated bed to heavy duty RW. Stand pivot transfer bed to recliner with RW and assist x 2 for safety. Pt exhibits decreased anxiety with SPT this date from previous session but does have ongoing anxiety with transfer and fear of falling. Sit to stand x 1 rep from low seat height recliner with mod to max +2 and cueing for anterior weight shift during transfer. Pt tolerates standing about 15 sec before requesting to return to sitting. Assisted pt with doffing pants prior to sitting due to pt's request. Pt left seated in recliner in room with needs in reach at end of session.  Therapy Documentation Precautions:  Precautions Precautions: Fall Precaution Comments: morbid obesity, monitor SpO2 and HR Restrictions Weight Bearing Restrictions: No    Therapy/Group: Individual Therapy   Excell Seltzer, PT, DPT  02/20/2019, 9:11 AM

## 2019-02-20 NOTE — Plan of Care (Signed)
  RD consulted for nutrition education regarding diabetes.   Lab Results  Component Value Date   HGBA1C 7.1 (H) 01/13/2019    Pt with insulin pump in place and request information regarding carbohydrate counting. RD provided "Carbohydrate Counting for People with Diabetes" handout from the Academy of [employer] and Dietetics. Discussed different food groups and their effects on blood sugar, emphasizing carbohydrate-containing foods. Provided list of carbohydrates and recommended serving sizes of common foods.  Discussed importance of controlled and consistent carbohydrate intake throughout the day. Provided examples of ways to balance meals/snacks and encouraged intake of high-fiber, whole grain complex carbohydrates. Practiced carb counting at lunch meal tray today. Teach back method used.  Expect good compliance.  Current diet order is regular. Pt reports having a good appetite with no difficulties. Labs and medications reviewed. No further nutrition interventions warranted at this time. RD contact information provided. If additional nutrition issues arise, please re-consult RD.  Corrin Parker, MS, RD, LDN RD pager number/after hours weekend pager number on Amion.

## 2019-02-21 ENCOUNTER — Inpatient Hospital Stay (HOSPITAL_COMMUNITY): Payer: Medicaid Other

## 2019-02-21 ENCOUNTER — Ambulatory Visit (HOSPITAL_COMMUNITY): Payer: Medicaid Other | Admitting: *Deleted

## 2019-02-21 ENCOUNTER — Encounter (HOSPITAL_COMMUNITY): Payer: Medicaid Other | Admitting: Psychology

## 2019-02-21 LAB — BASIC METABOLIC PANEL
Anion gap: 12 (ref 5–15)
BUN: 15 mg/dL (ref 6–20)
CO2: 29 mmol/L (ref 22–32)
Calcium: 9.7 mg/dL (ref 8.9–10.3)
Chloride: 100 mmol/L (ref 98–111)
Creatinine, Ser: 0.87 mg/dL (ref 0.44–1.00)
GFR calc Af Amer: >60 mL/min (ref >60)
GFR calc non Af Amer: >60 mL/min (ref >60)
Glucose, Bld: 193 mg/dL — ABNORMAL HIGH (ref 70–99)
Potassium: 3.7 mmol/L (ref 3.5–5.1)
Sodium: 141 mmol/L (ref 135–145)

## 2019-02-21 LAB — MAGNESIUM: Magnesium: 1.7 mg/dL (ref 1.7–2.4)

## 2019-02-21 LAB — GLUCOSE, CAPILLARY
Glucose-Capillary: 175 mg/dL — ABNORMAL HIGH (ref 70–99)
Glucose-Capillary: 188 mg/dL — ABNORMAL HIGH (ref 70–99)
Glucose-Capillary: 202 mg/dL — ABNORMAL HIGH (ref 70–99)
Glucose-Capillary: 247 mg/dL — ABNORMAL HIGH (ref 70–99)
Glucose-Capillary: 268 mg/dL — ABNORMAL HIGH (ref 70–99)
Glucose-Capillary: 271 mg/dL — ABNORMAL HIGH (ref 70–99)

## 2019-02-21 NOTE — Progress Notes (Signed)
[place] PHYSICAL MEDICINE & REHABILITATION PROGRESS NOTE   Subjective/Complaints:   Pt reports her basal rate is set for breakfast at 8am, however eating 6:30-7am- so her BGs are sky high in AM- will call DM coordinator to see if can fix this.   BMs 2x/day- not too loose, not hard. Getting to be a "habit".   L shoulder hurting- actually B/L shoulders hurt, but not quite as bad.   K+ 3.7   ROS- pt denies SOB, CP, abd pain; N/V/C/D/ vision changes, Headache. Objective:   No results found. Recent Labs    02/19/19 0522  WBC 7.8  HGB 14.4  HCT 42.7  PLT 309   Recent Labs    02/20/19 0541 02/21/19 0528  NA 139 141  K 3.0* 3.7  CL 94* 100  CO2 28 29  GLUCOSE 102* 193*  BUN 17 15  CREATININE 0.90 0.87  CALCIUM 10.0 9.7    Intake/Output Summary (Last 24 hours) at 02/21/2019 1451 Last data filed at 02/21/2019 1230 Gross per 24 hour  Intake 1200 ml  Output -  Net 1200 ml     Physical Exam: Vital Signs Blood pressure 111/83, pulse 99, temperature (!) 97.5 F (36.4 C), resp. rate (!) 23, height 5\' 9"  (1.753 m), weight (!) 158.8 kg, last menstrual period 06/17/2012, SpO2 100 %. Vitals reviewed. Constitutional: She appears well-developed. On bariatric low pressure relief bed Super obese; sitting up in bed; PT in room; NAD HENT: no O2 on this AM Head: Normocephalic.  Facial abrasisions esp on nose- healing but scarring lighter color than expected - skin dry on face Eyes: EOM are normal. Neck: No tracheal deviation present.  CV: RRR- no M/R/G Respiratory: decreased breath sounds at bases-otherwise good air movement ; no W/R/R GI: Soft. NT, ND, protuberant, (+)BS again- hypoactive Musculoskeletal:     Comments: LE edema  Neurological: She is alert.  Motor: RUE: shoulder abduction 2+/5, elbow flexion/extension 3-/5, hand grip 4-/5. Tenderness to palpation over right shoulder joint.  LUE: shoulder abduction 2/5, elbow flexion/extension 2+/5, hand grip 3/5 B/l LE:  HF, KE 3+/5, ADF 4-/5 Sensation intact to light touch  Skin: Skin is warm and dry.  Sacral ulcer -difficult to turn- will d/w nursing See above  Psychiatric: slightly flat affect but improving- less flat    Assessment/Plan: 1. Functional deficits secondary to critical illness myopathy which require 3+ hours per day of interdisciplinary therapy in a comprehensive inpatient rehab setting.  Physiatrist is providing close team supervision and 24 hour management of active medical problems listed below.  Physiatrist and rehab team continue to assess barriers to discharge/monitor patient progress toward functional and medical goals  Care Tool:  Bathing    Body parts bathed by patient: Abdomen, Face, Chest, Front perineal area   Body parts bathed by helper: Right arm, Left arm, Chest, Abdomen, Front perineal area, Buttocks, Right upper leg, Left upper leg, Right lower leg, Left lower leg     Bathing assist Assist Level: Maximal Assistance - Patient 24 - 49%     Upper Body Dressing/Undressing Upper body dressing   What is the patient wearing?: Hospital gown only    Upper body assist Assist Level: Maximal Assistance - Patient 25 - 49%    Lower Body Dressing/Undressing Lower body dressing      What is the patient wearing?: Incontinence brief     Lower body assist Assist for lower body dressing: 2 Helpers     Toileting Toileting    Toileting assist  Assist for toileting: Maximal Assistance - Patient 25 - 49%(perihygiene at bed level)     Transfers Chair/bed transfer  Transfers assist  Chair/bed transfer activity did not occur: Safety/medical concerns  Chair/bed transfer assist level: Dependent - mechanical lift     Locomotion Ambulation   Ambulation assist   Ambulation activity did not occur: Safety/medical concerns          Walk 10 feet activity   Assist  Walk 10 feet activity did not occur: Safety/medical concerns        Walk 50 feet  activity   Assist Walk 50 feet with 2 turns activity did not occur: Safety/medical concerns         Walk 150 feet activity   Assist Walk 150 feet activity did not occur: Safety/medical concerns         Walk 10 feet on uneven surface  activity   Assist Walk 10 feet on uneven surfaces activity did not occur: Safety/medical concerns         Wheelchair     Assist Will patient use wheelchair at discharge?: Yes(TBD) Type of Wheelchair: Manual(Per chart review LT goals set for dischrage )           Wheelchair 50 feet with 2 turns activity    Assist    Wheelchair 50 feet with 2 turns activity did not occur: Safety/medical concerns       Wheelchair 150 feet activity     Assist  Wheelchair 150 feet activity did not occur: Safety/medical concerns       Blood pressure 111/83, pulse 99, temperature (!) 97.5 F (36.4 C), resp. rate (!) 23, height 5\' 9"  (1.753 m), weight (!) 158.8 kg, last menstrual period 06/17/2012, SpO2 100 %.   Medical Problem List and Plan: 1.  Decreased functional mobility secondary to CIM due to acute hypoxemic respiratory failure due to ARDS/COVID-19 pneumonia.  Extubated 01/31/2019             -patient may shower             -ELOS/Goals: 27-32 days/Mod A            Continue CIR therapies.    -satting well on room air, changed O2 to PRN. 2.  Antithrombotics: -DVT/anticoagulation: Lovenox.  Venous Doppler studies negative             -antiplatelet therapy: Aspirin 81 mg daily 3. Pain Management: Tylenol as needed  2/5- added voltaren gel for L shoulder QID  2/8: added lidocaine patch for right shoulder.  4. Mood: Provide emotional support             -antipsychotic agents: N/A 5. Neuropsych: This patient is capable of making decisions on her own behalf. 6. Skin/Wound Care: Routine skin checks  2/8: facial abraisons healing well.  7. Fluids/Electrolytes/Nutrition: Routine in and outs.  CMP ordered for 2/1  2/1- Hypokalemia-  3.1- will replete and recheck Wednesday  2/3- will recheck in AM  2/4- K+ 3.3- will replete 40 mEq x2 and recheck this weekend  2/8: K+ 3.0; ordered 57meq x2 today with repeat BMP tomorrow   morning.   2/9- K+ still 3.0- will check Mg level; start Magnesium Oxide 400 mg BID as well as K+ replete x3 40 mEq and recheck in AM  2/10- Mg 1.7- a little low, but already started PO repletion yesterday; K+ up to 3.7- finally got KCL to respond- will recheck on Friday to make sure staying up, since it kept staying  llow 8.  Acute on chronic diastolic congestive heart failure.  Echocardiogram ejection fraction 50%.  Monitor for any signs of fluid overload.  Continue Demadex 20 mg twice daily             Daily weights.   Filed Weights   02/18/19 0510 02/19/19 0412 02/20/19 0413  Weight: (!) 155.6 kg (!) 155.6 kg (!) 158.8 kg    2/1- weight changed >30 lbs- will check into   2/2- same weight as yesterday.   2/3- no weight today  2/4- Weight 156k- up 1.3 kg- will monitor  2/5- weight back down to 151 kg- down 5 kg- unlikely  2/6- Weight 154.7 kg- makes more sense- will con't to monitor.   2/8: weight stable.  2/9- Weight 158.8 kg- up 2 kg? Will monitor for trend, since might not be that high   2/10- no weight- will check with nursing 9.  Diabetes mellitus with peripheral neuropathy.  Hemoglobin A1c 7.1.  NovoLog 15 units 3 times daily with meals, Lantus insulin 25 units twice daily.  Check blood sugars before meals and at bedtime.  Diabetic teaching   CBG (last 3)  Recent Labs    02/21/19 0425 02/21/19 0813 02/21/19 1150  GLUCAP 175* 271* 247*    2/1- just got Lantus increased yesterday- will look at trend in AM and titrate Lantus  2/2- Most BGs 200s- will increase Lantus to 30 units BID and monitor  2/3- place consult for DM coordinator since used to be on insulin pump at home- will see how they can help- I appreciate the assistance  2/4- increased Lantus to 35 units BID and Novolog to 16 units  TID with meals. Family to bring in insulin pump Friday? If so, will call DM coordinator to help.  2/5- will get DM coordinator to help if going to use insulin pump.   2/6- DM coordinator increased Lantus to 38 units BID, checking BGs more frequently and Novolog 0-15 units  2/7- increased Lantus to 42 units BID as of this evening  2/8: remain elevated but with improved control from yesterday.   2/9- back in insulin pump- will monitor  2/10- Called DM coordinator about BG basal starting at 8am- to move it back to her new breakfast time.              Monitor with increased mobility 10.  Cardiogenic shock.  Wean from pressors.  Monitor for orthostasis.  Continue Coreg 12.5 mg twice daily  2/4- will reduce to 6.25 mg BID since BP soft last night/this AM at 90s/50s 11.  Super obesity.  BMI 54.50.  Dietary follow-up.  Encourage weight loss 12.  Hyperlipidemia.  Crestor 13.  History of anemia.  Continue iron supplement.  Latest hemoglobin 11.6.             2/1- Hb 13.7 14.  Sickle cell trait.  Follow-up outpatient. 15. Constipation  2/2- will give Mg citrate and soap suds enema today to get pt cleaned out.   2/3- cleaned out per pt- will con't to monitor- and see if need to increase bowel meds. Will add Senokot 1 tab BID   2/4- LBM 2/4- will ask nursing to give prns  2/7- LBM yesterday afternon  2/10- having 2x/day- improved- con't regimen 16. L ear popping  2/7- will order Debrox drops for wax and see if helps. For L ear  2/10- improving finally per pt- will monitor    LOS: 12 days A FACE TO FACE  EVALUATION WAS PERFORMED  Moustafa Mossa 02/21/2019, 2:51 PM

## 2019-02-21 NOTE — Progress Notes (Signed)
Occupational Therapy Session Note  Patient Details  Name: Kaitlin [name] MRN: [id number] Date of Birth: 07-Aug-1965  Today's Date: 02/21/2019 OT Individual Time: [id number] OT Individual Time Calculation (min): 75 min    Short Term Goals: Week 2:  OT Short Term Goal 1 (Week 2): patient will complete rolling in bed with min A of one OT Short Term Goal 2 (Week 2): patient will complete sit to/from supine with mod A of one OT Short Term Goal 3 (Week 2): patient will complete UB bathing and dressing with mod A  Skilled Therapeutic Interventions/Progress Updates:    Pt resting in bed upon arrival. OT intervention with focus on bed mobility, BUE functional use, sitting balance, and activity tolerance to increase independence with BADLs. Pt requires min A for rolling to L using bed rails and mod A+2 rolling to R. Pt requested use of bedpan prior to getting OOB. Pt required tot A +2 for hygiene. Pt transferred to recliner with MaxiSky. Pt engaged in [place] reaching tasks to increase functional use of BUE. LUE improved ability to reach to her L ear and side of head. LUE PROM/AAROM/AROM. Pt remained seated in recliner with all needs within reach.   Therapy Documentation Precautions:  Precautions Precautions: Fall Precaution Comments: morbid obesity, monitor SpO2 and HR Restrictions Weight Bearing Restrictions: No Pain:  Pt c/o B shoulder discomfort; repositioned, PROM   Therapy/Group: Individual Therapy  Leroy Libman 02/21/2019, 9:33 AM

## 2019-02-21 NOTE — Plan of Care (Signed)
  Problem: Consults Goal: RH GENERAL PATIENT EDUCATION Description: See Patient Education module for education specifics. Outcome: Progressing Goal: Skin Care Protocol Initiated - if Braden Score 18 or less Description: If consults are not indicated, leave blank or document N/A Outcome: Progressing   Problem: RH BLADDER ELIMINATION Goal: RH STG MANAGE BLADDER WITH ASSISTANCE Description: STG Manage Bladder With mod Assistance Outcome: Progressing   Problem: RH SKIN INTEGRITY Goal: RH STG SKIN FREE OF INFECTION/BREAKDOWN Description: Pt will be free of skin breakdown or pressure ulcers prior to DC with mod assist Outcome: Progressing Goal: RH STG MAINTAIN SKIN INTEGRITY WITH ASSISTANCE Description: STG Maintain Skin Integrity With mod Assistance. Outcome: Progressing Goal: RH STG ABLE TO PERFORM INCISION/WOUND CARE W/ASSISTANCE Description: STG Able To Perform Incision/Wound Care With mod Assistance. Outcome: Progressing   Problem: RH SAFETY Goal: RH STG ADHERE TO SAFETY PRECAUTIONS W/ASSISTANCE/DEVICE Description: STG Adhere to Safety Precautions With mod Assistance/Device. Outcome: Progressing

## 2019-02-21 NOTE — Progress Notes (Addendum)
Inpatient Diabetes Program Recommendations  AACE/ADA: New Consensus Statement on Inpatient Glycemic Control (2015)  Target Ranges:  Prepandial:   less than 140 mg/dL      Peak postprandial:   less than 180 mg/dL (1-2 hours)      Critically ill patients:  140 - 180 mg/dL   Lab Results  Component Value Date   GLUCAP 271 (H) 02/21/2019   HGBA1C 7.1 (H) 01/13/2019    Review of Glycemic Control  Diabetes history:DM 2 Outpatient Diabetes medications: insulin pump, Metformin 1000 gm bid, Semaglutide 0.25 mg weekly  Medtronic insulin pump Average total insulin daily 152 units/day (basal insulin=78 units daily) 12 am - 2.5 units/hr 8 am- 3.5 units/hr 12 pm- 3.5 units/hr 4 pm- 3.75 units/hr 10 pm- 3.75 units/hr  Her insulin to carb ratio is now 1:5 and her sensitivity is 1:25.  Current orders for Inpatient glycemic control:  Insulin pump order set  Results for Kaitlin, [name] (MRN [id number]) as of 02/21/2019 11:11  Ref. Range 02/20/2019 04:09 02/20/2019 07:43 02/20/2019 11:31 02/20/2019 16:39 02/20/2019 20:34 02/20/2019 23:57 02/21/2019 04:25 02/21/2019 08:13  Glucose-Capillary Latest Ref Range: 70 - 99 mg/dL 109 (H) 209 (H) 248 (H) 221 (H) 216 (H) 188 (H) 175 (H) 271 (H)    Inpatient Diabetes Program Recommendations:    Some elevation in the past 24 hours. Spoke with Kaitlin Chew, PA in regards to plan of care. Called Dr. Thompson Caul office from [facility] Endocrinology office. Waiting call back.  Addendum 1400 pm: Still waiting on call back.  Addendum 1600 pm:  Still waiting on call back from Dr. Thompson Caul office. Patient's insulin pump continuously infuses a basal rate throughout the day. The basal rate just increases at 8 am please refer to settings listed above. Hopefully get get a call this evening and if not will touch base with the office again tomorrow.   1609 pm: Kaitlin [name], CDE and pump trainer for pt called from Dr. Thompson Caul office. After conversation will inform and watch  pt change basal settings for 8am: 3.8 units/hour, 12 pm 3.7 units/hour, 4 pm: 4.0 units/hour.   Will follow trends and make more adjustments on Friday 2/12 if needed but DM Coordinator assigned to pt will call Kaitlin [name], CDE first with glucose trends prior to adjustments.  Thanks,  Tama Headings RN, MSN, BC-ADM Inpatient Diabetes Coordinator Team Pager [phone] (8a-5p)

## 2019-02-21 NOTE — Consult Note (Signed)
Neuropsychological Consultation   Patient:   Kaitlin [name]   DOB:   1965/06/21  MR Number:  [id number]  Location:  [address] [place] [id number] [place] [address] Dept: [place]: [phone]           Date of Service:   02/21/19  Start Time:   10 AM End Time:   11 AM  Provider/Observer:  Ilean Skill, Psy.D.       Clinical Neuropsychologist       Billing Code/Service: 96158/96159  Chief Complaint:    Kaitlin [name] is a 54 year old female with history of anemia, bronchitis, bilateral cataracts, NICM, chronic diastolic CHF, diabetes with insulin pump, hyperlipidemia, hypertension, morbid obesity, sickle cell trait, history of thymoma with resection in 2015. Presented on 01/13/19 with increasing SOB, cough, generalized weakness and nausea.  SARS coronavirus positive 01/12/19.  Chest x-ray concerning for multifocal pneumonia.  Complicated course including need for intubation and extubated on 01/31/19 and slowly weaned off supplemental oxygen.  Now admitted for CIR program.     Patient had seen neurology in 2016 due to weakness and leg pain after tumor removal.  CT and MRI at time were WNL.    Reason for Service:  Patient referred for neuropsychological consultation due to coping and adjustment.  Below is the HPI for the current admission.  HPI: Kaitlin [name] is a 54 year old right-handed female with history of anemia, bronchitis, bilateral cataracts, NICM,chronic diastolic congestive heart failure, type 2 diabetes mellitus, hyperlipidemia, hypertension, morbid obesity with BMI 54.50, sickle cell trait, history of thymoma with resection 2015.  History taken from chart review and patient. Patient lives with her family.  1 level apartment with level entry.  Independent prior to admission.  She is unemployed.  Patient does have family in the area to provide assistance at discharge.  She presented on  01/13/2019 with increasing SOB, cough, generalized weakness, and nausea. Patient had been exposed to her son who recently was positive for COVID-19.  In the ED WBC 8.7, hemoglobin 12.7, platelets 203,000, lactic acid normal, BUN 22, creatinine 1.17, D-dimer 1.54, troponin high-sensitivity 29, SARS coronavirus 01/12/2019 positive.  It was noted patient recently checked for coronavirus 11/19/2018 that was negative.  Chest x-ray bilateral diffuse patchy airspace opacities concerning for multifocal pneumonia.  Patient received remdesivir, dexamethasone as well as actemra.  Antibiotics were later broadened due to shock requiring pressors and stress steroids and has since been completed.  Received 1000 mL normal saline bolus for hypotension and her Coreg was initially held.  Echocardiogram with EF of [id number], grade 1 diastolic dysfunction without emboli. Bilateral lower extremity Dopplers negative for DVT.  Patient did require intubation and extubated on 01/31/2019 and weaned off supplemental oxygen.  Patient did complete 21 days of therapy for COVID-19 sustaining improvement isolation later discontinued and patient was transferred from [place] to [facility] 02/03/2019.  Patient has been receiving wound care for partial-thickness blistered areas.  Her diet has been advanced to a regular consistency.  Therapy evaluations completed and patient was admitted for a comprehensive rehab program. Please see preadmission assessment from earlier today as well.   Current Status:  Patient with much improved affect and alertness.  She remained oriented x4.  She did not remember meeting me prior week.  Patient reports that her energy has improved with starting insulin pump.  Mood was positive and she denies any issues with depression/anxiety, although  she is wanting to get home as soon as possible, which she reports motivates her to continue to work hard in therapies.    Behavioral Observation: Kaitlin [name]  presents  as a 54 y.o.-year-old Right African American Female who appeared her stated age. her dress was Appropriate and she was Well Groomed and her manners were Appropriate to the situation.  her participation was indicative of Appropriate behaviors.  There were any physical disabilities noted.  she displayed an appropriate level of cooperation and motivation.     Interactions:    Active Appropriate  Attention:   abnormal and attention span appeared shorter than expected for age  Memory:   within normal limits; recent and remote memory intact  Visuo-spatial:  not examined  Speech (Volume):  normal  Speech:   normal; normal  Thought Process:  Coherent and Relevant  Though Content:  WNL; not suicidal and not homicidal  Orientation:   person, place, time/date and situation  Judgment:   Fair  Planning:   Fair  Affect:    Appropriate  Mood:    Euthymic  Insight:   Good  Intelligence:   normal  Medical History:   Past Medical History:  Diagnosis Date  . Anemia   . Bronchitis   . Cataracts, bilateral 01/12/2018  . CHF (congestive heart failure) ([place])   . Diabetes mellitus   . High cholesterol   . Hypertension    takes medicine to protect kidneys, does not have HTN  . Leukocytosis   . Morbid obesity ([place]) 09/11/2011   BMI 57  . Neuropathy 01/12/2018  . NICM (nonischemic cardiomyopathy) ([place]) 05/01/2013   Overview:  Last Assessment & Plan:  euvolemic on physical exam.   . Obesity   . Sickle cell trait ([place])   . Thymoma    Psychiatric History:  No prior psychiatric history  Family Med/Psych History:  Family History  Problem Relation Age of Onset  . Heart disease Father        No details  . Diabetes type II Other        Family HX  . Breast cancer Other    Impression/DX:  Kaitlin [name] is a 54 year old female with history of anemia, bronchitis, bilateral cataracts, NICM, chronic diastolic CHF, diabetes with insulin pump, hyperlipidemia, hypertension, morbid obesity, sickle  cell trait, history of thymoma with resection in 2015. Presented on 01/13/19 with increasing SOB, cough, generalized weakness and nausea.  SARS coronavirus positive 01/12/19.  Chest x-ray concerning for multifocal pneumonia.  Complicated course including need for intubation and extubated on 01/31/19 and slowly weaned off supplemental oxygen.  Now admitted for CIR program.     Patient with much improved affect and alertness.  She remained oriented x4.  She did not remember meeting me prior week.  Patient reports that her energy has improved with starting insulin pump.  Mood was positive and she denies any issues with depression/anxiety, although she is wanting to get home as soon as possible, which she reports motivates her to continue to work hard in therapies.    Disposition/Plan:  Worked on coping with long hospital course.  Will follow-up next week.         Electronically Signed   _______________________ Ilean Skill, Psy.D.

## 2019-02-21 NOTE — Progress Notes (Signed)
Physical Therapy Session Note  Patient Details  Name: Kaitlin [name] MRN: [id number] Date of Birth: Feb 18, 1965  Today's Date: 02/21/2019 PT Individual Time: 1100-1155 PT Individual Time Calculation (min): 55 min   Short Term Goals: Week 2:  PT Short Term Goal 1 (Week 2): Pt will complete bed mobility with assist x 1 PT Short Term Goal 2 (Week 2): Pt will complete stand pivot transfer with RW and assist x 2 PT Short Term Goal 3 (Week 2): Pt will ambulate x 5 ft with LRAD and assist x 2  Skilled Therapeutic Interventions/Progress Updates:    Pt received seated in recliner in room, reports urge to have a BM. No complaints of pain. Sit to stand with assist x 2 to RW from low recliner seat height. Stand pivot transfer recliner to heavy duty BSC with min A +2 for safety. Pt is able to continently void while seated on BSC, see Flowsheet for details. Pt is able to stand from [facility] to RW with max A +2. Standing balance with RW and min A while assist from a 2nd person for dependent pericare and donning of new brief. Stand pivot transfer back to recliner with min A +2 and RW. Attempt sit to stand again from recliner seat height, pt unable to come to a full stand due to low seat height and fatigue. Placed maxi sky transfer sling and transferred pt to EOB. Pt reports onset of dizziness, fatigue, and nausea. Sit to supine via maxi sky transfer sling. Supine BP 115/80, HR 118, SpO2 99%. Pt reports decrease in symptoms after seated rest break. Rolling L/R with max A for removal of maxi sky sling. Pt left semi-reclined in bed with needs in reach at end of session. RN notified of pt symptoms during session. Cotreatment session with recreational therapy.  Therapy Documentation Precautions:  Precautions Precautions: Fall Precaution Comments: morbid obesity, monitor SpO2 and HR Restrictions Weight Bearing Restrictions: No    Therapy/Group: Individual Therapy   Excell Seltzer, PT, DPT  02/21/2019, 12:00 PM

## 2019-02-21 NOTE — Progress Notes (Signed)
Recreational Therapy Session Note  Patient Details  Name: Kaitlin [name] MRN: [id number] Date of Birth: 1965-07-25 Today's Date: 02/21/2019 Time:  11-12 Pain: no c/o Skilled Therapeutic Interventions/Progress Updates: Pt seated in recliner upon arrival with urgency to use the toilet.  Pt performed sit-stand and stand pivot transfer with RW to [facility] with min assist, +2 for safety.  Pt voided and had BM.  Pt performed sit-stand from [facility] with max assist +2, maintained standing balance for hygiene with min assist +2 with RW.  Stand pivot back to the recliner with min assist +2.  After seated rest break, attempted sit-stand from recliner but pt unable due to fatigue and low seat height.  Utilized maxi sky to transfer pt to sitting EOB.  Pt reporting feeling dizzy, fatigued and nauseous.  Vitals taken and charted in PT note.  Pt reported feeling better after returned to lying in bed.  Nursing made aware of pt symptoms.  Therapy/Group: Co-Treatment   Rosilyn Coachman 02/21/2019, 4:12 PM

## 2019-02-21 NOTE — Progress Notes (Signed)
Occupational Therapy Session Note  Patient Details  Name: Kaitlin [name] MRN: [id number] Date of Birth: 08/14/1965  Today's Date: 02/21/2019 OT Individual Time: 1400-1515 OT Individual Time Calculation (min): 75 min    Short Term Goals: Week 1:  OT Short Term Goal 1 (Week 1): patient will complete rolling in bed with min A of one OT Short Term Goal 1 - Progress (Week 1): Progressing toward goal OT Short Term Goal 2 (Week 1): patient will complete sit to/from supine with mod A of one OT Short Term Goal 2 - Progress (Week 1): Progressing toward goal OT Short Term Goal 3 (Week 1): patient will tolerate sitting in w/c or recliner 1-2 hours per day OT Short Term Goal 3 - Progress (Week 1): Met OT Short Term Goal 4 (Week 1): patient will complete UB bathing and dressing with mod A OT Short Term Goal 4 - Progress (Week 1): Progressing toward goal  Skilled Therapeutic Interventions/Progress Updates:    1;1. Pt received in bed tearful. Pt very concerned about being able to go home and take care of herself by the 27th. Pt provided with support and encouragement and reminder of goals/progress already made. Pt agreeable to transfer training and estim for L shoulder. Pt completes sit to stand from EOB with RW with MOD A +2 from elevated surface to take 5 steps to recliner with MIN steadying A to recliner. Pt able to complete 1 sit to stand from recliner with MAX A +2 and VC for anterior weight shifting forward. Pt completes stand pivot transfer back to bed as stated above.   saebo stim applied for shoulder flexion with minimal improvement but pt reporting pain relief. 30 min attended 30 min unattended. Skin in tact at retrieval of unit  Saebo Stim One 330 pulse width 35 Hz pulse rate On 8 sec/ off 8 sec Ramp up/ down 2 sec Symmetrical Biphasic wave form  Max intensity 110mA at 500 Ohm load  Exited session with pt seated in bed, exit alarm on and call light in reach.  Therapy  Documentation Precautions:  Precautions Precautions: Fall Precaution Comments: morbid obesity, monitor SpO2 and HR Restrictions Weight Bearing Restrictions: No General:   Vital Signs:  Pain:   ADL: ADL Eating: Minimal assistance Where Assessed-Eating: Bed level Grooming: Minimal assistance Where Assessed-Grooming: Bed level Upper Body Bathing: Maximal assistance Where Assessed-Upper Body Bathing: Bed level Lower Body Bathing: Dependent Where Assessed-Lower Body Bathing: Bed level Upper Body Dressing: Maximal assistance Where Assessed-Upper Body Dressing: Bed level Lower Body Dressing: Dependent Where Assessed-Lower Body Dressing: Bed level Toileting: Dependent Where Assessed-Toileting: Bed level Toilet Transfer: Other (comment)(maximove bed to w/c today, unsafe at this time to transfer to commode) ADL Comments: significant fatigue with adl tasks Vision   Perception    Praxis   Exercises:   Other Treatments:     Therapy/Group: Individual Therapy  Stephanie M Schlosser 02/21/2019, 3:14 PM 

## 2019-02-22 ENCOUNTER — Inpatient Hospital Stay (HOSPITAL_COMMUNITY): Payer: Medicaid Other | Admitting: Physical Therapy

## 2019-02-22 ENCOUNTER — Inpatient Hospital Stay (HOSPITAL_COMMUNITY): Payer: Medicaid Other

## 2019-02-22 ENCOUNTER — Inpatient Hospital Stay (HOSPITAL_COMMUNITY): Payer: Medicaid Other | Admitting: Occupational Therapy

## 2019-02-22 LAB — GLUCOSE, CAPILLARY
Glucose-Capillary: 176 mg/dL — ABNORMAL HIGH (ref 70–99)
Glucose-Capillary: 308 mg/dL — ABNORMAL HIGH (ref 70–99)
Glucose-Capillary: 283 mg/dL — ABNORMAL HIGH (ref 70–99)
Glucose-Capillary: 150 mg/dL — ABNORMAL HIGH (ref 70–99)
Glucose-Capillary: 119 mg/dL — ABNORMAL HIGH (ref 70–99)

## 2019-02-22 MED ORDER — CYCLOBENZAPRINE HCL 5 MG PO TABS
5.0000 mg | ORAL_TABLET | Freq: Three times a day (TID) | ORAL | Status: DC | PRN
  Filled 2019-02-22: qty 1

## 2019-02-22 NOTE — Progress Notes (Signed)
Physical Therapy Session Note  Patient Details  Name: Kaitlin [name] MRN: [id number] Date of Birth: 1965-06-02  Today's Date: 02/22/2019 PT Individual Time: [id number] PT Individual Time Calculation (min): 38 min  PT Missed Time: 22 min Missed Time Reason: patient illness (high blood sugar)  Short Term Goals: Week 2:  PT Short Term Goal 1 (Week 2): Pt will complete bed mobility with assist x 1 PT Short Term Goal 2 (Week 2): Pt will complete stand pivot transfer with RW and assist x 2 PT Short Term Goal 3 (Week 2): Pt will ambulate x 5 ft with LRAD and assist x 2  Skilled Therapeutic Interventions/Progress Updates:    Attempted to see patient for scheduled therapy session in AM. Pt received seated in recliner in room requesting RN and diabetes coordinator come to discuss her ongoing elevated blood sugar this AM. Pt reports not feeling well due to elevated BG, RN able to come in and assess. Pt is able to deliver units of insulin via her monitor but declines participation in therapy session at this time due to not feeling well. Will attempt to see patient later this PM. Pt missed 22 min of scheduled therapy session due to not feeling well 2/2 high blood sugar.  Returned to see patient in PM and she was feeling better and agreeable to session. No complaints of pain. Pt reports urge to have a BM. Sit to stand with mod A +2 from recliner to RW. Stand pivot transfer recliner to heavy duty BSC with min A +2. Pt able to continently void on BSC, see Flowsheet for details. Sit to stand with mod A +2 from [facility] to RW. Standing tolerance x 5 min while dependent pericare and clothing management performed. Ambulation 2 x 10 ft with RW with min A +2. Pt exhibits improved tolerance for standing and gait training this date. Sit to supine assist x 2 for trunk control and BLE management. Pt left semi-reclined in bed with needs in reach at end of session.   Therapy Documentation Precautions:   Precautions Precautions: Fall Precaution Comments: morbid obesity, monitor SpO2 and HR Restrictions Weight Bearing Restrictions: No    Therapy/Group: Individual Therapy   Excell Seltzer, PT, DPT  02/22/2019, 3:48 PM

## 2019-02-22 NOTE — Progress Notes (Signed)
Occupational Therapy Session Note  Patient Details  Name: Kaitlin [name] MRN: [id number] Date of Birth: [birth year]  Today's Date: 02/22/2019 OT Individual Time: 7619-5093 OT Individual Time Calculation (min): 42 min    Short Term Goals: Week 1:  OT Short Term Goal 1 (Week 1): patient will complete rolling in bed with min A of one OT Short Term Goal 1 - Progress (Week 1): Progressing toward goal OT Short Term Goal 2 (Week 1): patient will complete sit to/from supine with mod A of one OT Short Term Goal 2 - Progress (Week 1): Progressing toward goal OT Short Term Goal 3 (Week 1): patient will tolerate sitting in w/c or recliner 1-2 hours per day OT Short Term Goal 3 - Progress (Week 1): Met OT Short Term Goal 4 (Week 1): patient will complete UB bathing and dressing with mod A OT Short Term Goal 4 - Progress (Week 1): Progressing toward goal  Skilled Therapeutic Interventions/Progress Updates:    saebo stim applied to deltoid prior to session for 20 additional minutes (unattended) and 40 min attended during 1:1 session. Saebo Stim One 330 pulse width 35 Hz pulse rate On 8 sec/ off 8 sec Ramp up/ down 2 sec Symmetrical Biphasic wave form  Max intensity 121m at 500 Ohm load  1;1. Pt received in bed agreeable to OT reporting fatigue from PT session. Pt agreeable to working on UEs, pt completes 5x 1 min on 1 min off beach ball volley with BUE hitting beach ball back to OT to improve AROM, strengthening and endruance. Pt preferred music playing in background. Pt completes isometric hold of shoulder flexion at 90* with bed flat 2x20 seconds followed by 10 seconds eccentric contraction from 90* shoulder flexion to 0* with hands clasped. Pt requires rest breaks between each repetition. Exited session with pt seated in bed, exit alarm on and call light in reach  Therapy Documentation Precautions:  Precautions Precautions: Fall Precaution Comments: morbid obesity, monitor SpO2 and  HR Restrictions Weight Bearing Restrictions: No General:   Vital Signs: Therapy Vitals Temp: 98.1 F (36.7 C) Pulse Rate: 96 BP: 122/70 Patient Position (if appropriate): Lying Oxygen Therapy SpO2: 98 % O2 Device: Room Air Pain:   ADL: ADL Eating: Minimal assistance Where Assessed-Eating: Bed level Grooming: Minimal assistance Where Assessed-Grooming: Bed level Upper Body Bathing: Maximal assistance Where Assessed-Upper Body Bathing: Bed level Lower Body Bathing: Dependent Where Assessed-Lower Body Bathing: Bed level Upper Body Dressing: Maximal assistance Where Assessed-Upper Body Dressing: Bed level Lower Body Dressing: Dependent Where Assessed-Lower Body Dressing: Bed level Toileting: Dependent Where Assessed-Toileting: Bed level Toilet Transfer: Other (comment)(maximove bed to w/c today, unsafe at this time to transfer to commode) ADL Comments: significant fatigue with adl tasks Vision   Perception    Praxis   Exercises:   Other Treatments:     Therapy/Group: Individual Therapy  STonny Branch2/11/2019, 3:12 PM

## 2019-02-22 NOTE — Progress Notes (Signed)
Occupational Therapy Session Note  Patient Details  Name: Kaitlin [name] MRN: [id number] Date of Birth: 1965/02/03  Today's Date: 02/22/2019 OT Individual Time: 0705-0729 OT Individual Time Calculation (min): 24 min    Short Term Goals: Week 2:  OT Short Term Goal 1 (Week 2): patient will complete rolling in bed with min A of one OT Short Term Goal 2 (Week 2): patient will complete sit to/from supine with mod A of one OT Short Term Goal 3 (Week 2): patient will complete UB bathing and dressing with mod A  Skilled Therapeutic Interventions/Progress Updates:    Upon entering the room, pt in bed with HOB elevated and eating breakfast. OT reviewing schedule with pt , discussing goals, family education needing to be scheduled before discharge, and energy conservation education started. Pt was active participant in education and asking appropriate questions. Pt verbalizing having two daughters who will be helping at discharge and one is available on weekends and the other on Tuesday and wed for hands on education. She verbalized her daughter plans to take leave from room once she returns home to provide assistance. OT exiting the room with all needs within reach for pt and she continues to finish breakfast tray.  Therapy Documentation Precautions:  Precautions Precautions: Fall Precaution Comments: morbid obesity, monitor SpO2 and HR Restrictions Weight Bearing Restrictions: No General:   Vital Signs:   Pain:   ADL: ADL Eating: Minimal assistance Where Assessed-Eating: Bed level Grooming: Minimal assistance Where Assessed-Grooming: Bed level Upper Body Bathing: Maximal assistance Where Assessed-Upper Body Bathing: Bed level Lower Body Bathing: Dependent Where Assessed-Lower Body Bathing: Bed level Upper Body Dressing: Maximal assistance Where Assessed-Upper Body Dressing: Bed level Lower Body Dressing: Dependent Where Assessed-Lower Body Dressing: Bed level Toileting:  Dependent Where Assessed-Toileting: Bed level Toilet Transfer: Other (comment)(maximove bed to w/c today, unsafe at this time to transfer to commode) ADL Comments: significant fatigue with adl tasks   Therapy/Group: Individual Therapy  Gypsy Decant 02/22/2019, 8:24 AM

## 2019-02-22 NOTE — Plan of Care (Signed)
  Problem: Consults Goal: RH GENERAL PATIENT EDUCATION Description: See Patient Education module for education specifics. Outcome: Progressing Goal: Skin Care Protocol Initiated - if Braden Score 18 or less Description: If consults are not indicated, leave blank or document N/A Outcome: Progressing   Problem: RH BLADDER ELIMINATION Goal: RH STG MANAGE BLADDER WITH ASSISTANCE Description: STG Manage Bladder With mod Assistance Outcome: Progressing   Problem: RH SKIN INTEGRITY Goal: RH STG SKIN FREE OF INFECTION/BREAKDOWN Description: Pt will be free of skin breakdown or pressure ulcers prior to DC with mod assist Outcome: Progressing Goal: RH STG MAINTAIN SKIN INTEGRITY WITH ASSISTANCE Description: STG Maintain Skin Integrity With mod Assistance. Outcome: Progressing Goal: RH STG ABLE TO PERFORM INCISION/WOUND CARE W/ASSISTANCE Description: STG Able To Perform Incision/Wound Care With mod Assistance. Outcome: Progressing   Problem: RH SAFETY Goal: RH STG ADHERE TO SAFETY PRECAUTIONS W/ASSISTANCE/DEVICE Description: STG Adhere to Safety Precautions With mod Assistance/Device. Outcome: Progressing

## 2019-02-22 NOTE — Progress Notes (Signed)
Physical Therapy Session Note  Patient Details  Name: Kaitlin [name] MRN: [id number] Date of Birth: 1965-06-05  Today's Date: 02/22/2019 PT Individual Time: 0907-1000 PT Individual Time Calculation (min): 53 min   Short Term Goals: Week 2:  PT Short Term Goal 1 (Week 2): Pt will complete bed mobility with assist x 1 PT Short Term Goal 2 (Week 2): Pt will complete stand pivot transfer with RW and assist x 2 PT Short Term Goal 3 (Week 2): Pt will ambulate x 5 ft with LRAD and assist x 2  Skilled Therapeutic Interventions/Progress Updates:   Pt received supine in bed with RN present administering medications and pt agreeable to therapy session. Therapist donned BLE TEDs max assist. Performed the following supine B LE exercises: heel slides x10each, active ankle DF hold x30 sec for gastroc stretch, straight leg raise x10each, and bridge x3 reps with pt ale to clear hips from bed with great effort (therapist assisting with feet position to prevent them from sliding on bed) - cuing throughout for proper form/technique. Donned pants supine in bed with max assist for threading LEs and pulling pants over hips while rolling R/L in bed with min/mod assist. Supine>sitting L EOB with min/mod assist for trunk upright and max cuing for sequencing of logroll technique to increase pt independence with mobility. Upon sitting EOB, pt reports sudden onset of L UE heaviness and "tingling" in the axillary and radial nerve distributions; denies any pain - therapist advised resting arm in comfortable position in her lap and performing active scapular retraction x5 reps to open space surrounding brachial plexus - pt reports tingling has stopped but reports she still feels weaker in L UE compared to the past few days - Dr. Dagoberto Ligas and primary PT/OT notified. Pharmacist present for diabetes management. Sit>stand EOB>bari RW with +2 min assist for lifting into stance. Gait ~86ft EOB>recliner using bari-RW with min assist and +2  present for safety - pt demonstrates ability to manage AD without assistance but cuing. Stand>sit bari-RW to recliner with min assist for eccentric control. Therapist had placed w/c cushion in recliner chair to elevate seat height for improved sit>stand later. Pt left seated in recliner with needs in reach.  Therapy Documentation Precautions:  Precautions Precautions: Fall Precaution Comments: morbid obesity, monitor SpO2 and HR Restrictions Weight Bearing Restrictions: No  Pain:  Denies pain throughout session.   Therapy/Group: Individual Therapy  Tawana Scale, PT, DPT 02/22/2019, 7:52 AM

## 2019-02-22 NOTE — Progress Notes (Signed)
[place] PHYSICAL MEDICINE & REHABILITATION PROGRESS NOTE   Subjective/Complaints:   Pt reports pain in her chest muscles- from pulling herself up- not worried it's chest pain - feels like muscle spasms- and reproducible.   L ear somewhat better- not bubbling last few times blew her nose.  No wax that she's aware of.   Asking for something for muscle tightness.    ROS- pt denies SOB, CP, abd pain; N/V/C/D/ vision changes, Headache. Objective:   No results found. No results for input(s): WBC, HGB, HCT, PLT in the last 72 hours. Recent Labs    02/20/19 0541 02/21/19 0528  NA 139 141  K 3.0* 3.7  CL 94* 100  CO2 28 29  GLUCOSE 102* 193*  BUN 17 15  CREATININE 0.90 0.87  CALCIUM 10.0 9.7    Intake/Output Summary (Last 24 hours) at 02/22/2019 0955 Last data filed at 02/21/2019 1817 Gross per 24 hour  Intake 460 ml  Output --  Net 460 ml     Physical Exam: Vital Signs Blood pressure 109/63, pulse 99, temperature 98.1 F (36.7 C), temperature source Oral, resp. rate 18, height 5\' 9"  (1.753 m), weight (!) 158.8 kg, last menstrual period 06/17/2012, SpO2 96 %. Vitals reviewed. Constitutional: . On bariatric low pressure relief bed; Super obese, nursing in room; also wearing cap; supine/sitting up somewhat in bed;  NAD HENT: little wax seen on ear canal/near opening of ear Head: Normocephalic.  Facial abrasisions esp on nose- healing but scarring lighter color than expected - skin dry on face Eyes: EOM are normal. Neck: No tracheal deviation present.  CV: RRR- no M/R/G Respiratory: decreased breath sounds at bases-otherwise good air movement ; no W/R/R GI: Soft. NT, ND, protuberant, (+)BS again- hypoactive Musculoskeletal:     Comments: LE edema  Neurological: She is alert.  Motor: very tight L pec muscle- pain reproducible when palpates L pec just above L breast  RUE: shoulder abduction 2+/5, elbow flexion/extension 3-/5, hand grip 4-/5. Tenderness to palpation over  right shoulder joint.  LUE: shoulder abduction 2/5, elbow flexion/extension 2+/5, hand grip 3/5 B/l LE: HF, KE 3+/5, ADF 4-/5 Sensation intact to light touch  Skin: Skin is warm and dry.  Sacral ulcer healed; Has a pressure ulcer on back of waist/on back- 5-6 cm by <1 cm and very superficial but slightly open- granulating well.  See above  Psychiatric: slightly flat affect but improving- less flat    Assessment/Plan: 1. Functional deficits secondary to critical illness myopathy which require 3+ hours per day of interdisciplinary therapy in a comprehensive inpatient rehab setting.  Physiatrist is providing close team supervision and 24 hour management of active medical problems listed below.  Physiatrist and rehab team continue to assess barriers to discharge/monitor patient progress toward functional and medical goals  Care Tool:  Bathing    Body parts bathed by patient: Abdomen, Face, Chest, Front perineal area   Body parts bathed by helper: Right arm, Left arm, Chest, Abdomen, Front perineal area, Buttocks, Right upper leg, Left upper leg, Right lower leg, Left lower leg     Bathing assist Assist Level: Maximal Assistance - Patient 24 - 49%     Upper Body Dressing/Undressing Upper body dressing   What is the patient wearing?: Hospital gown only    Upper body assist Assist Level: Maximal Assistance - Patient 25 - 49%    Lower Body Dressing/Undressing Lower body dressing      What is the patient wearing?: Incontinence brief  Lower body assist Assist for lower body dressing: 2 Helpers     Toileting Toileting    Toileting assist Assist for toileting: Maximal Assistance - Patient 25 - 49%(perihygiene at bed level)     Transfers Chair/bed transfer  Transfers assist  Chair/bed transfer activity did not occur: Safety/medical concerns  Chair/bed transfer assist level: Dependent - mechanical lift     Locomotion Ambulation   Ambulation assist   Ambulation  activity did not occur: Safety/medical concerns          Walk 10 feet activity   Assist  Walk 10 feet activity did not occur: Safety/medical concerns        Walk 50 feet activity   Assist Walk 50 feet with 2 turns activity did not occur: Safety/medical concerns         Walk 150 feet activity   Assist Walk 150 feet activity did not occur: Safety/medical concerns         Walk 10 feet on uneven surface  activity   Assist Walk 10 feet on uneven surfaces activity did not occur: Safety/medical concerns         Wheelchair     Assist Will patient use wheelchair at discharge?: Yes(TBD) Type of Wheelchair: Manual(Per chart review LT goals set for dischrage )           Wheelchair 50 feet with 2 turns activity    Assist    Wheelchair 50 feet with 2 turns activity did not occur: Safety/medical concerns       Wheelchair 150 feet activity     Assist  Wheelchair 150 feet activity did not occur: Safety/medical concerns       Blood pressure 109/63, pulse 99, temperature 98.1 F (36.7 C), temperature source Oral, resp. rate 18, height 5\' 9"  (1.753 m), weight (!) 158.8 kg, last menstrual period 06/17/2012, SpO2 96 %.   Medical Problem List and Plan: 1.  Decreased functional mobility secondary to CIM due to acute hypoxemic respiratory failure due to ARDS/COVID-19 pneumonia.  Extubated 01/31/2019             -patient may shower             -ELOS/Goals: 27-32 days/Mod A            Continue CIR therapies.    -satting well on room air, changed O2 to PRN. 2.  Antithrombotics: -DVT/anticoagulation: Lovenox.  Venous Doppler studies negative             -antiplatelet therapy: Aspirin 81 mg daily 3. Pain Management: Tylenol as needed  2/5- added voltaren gel for L shoulder QID  2/8: added lidocaine patch for right shoulder.  2/11 - needs some muscle relaxants for L muscular chest pain- will try flexeril 5mg  TID prn for muscle tightness  4. Mood: Provide  emotional support             -antipsychotic agents: N/A 5. Neuropsych: This patient is capable of making decisions on her own behalf. 6. Skin/Wound Care: Routine skin checks  2/8: facial abraisons healing well.  7. Fluids/Electrolytes/Nutrition: Routine in and outs.  CMP ordered for 2/1  2/1- Hypokalemia- 3.1- will replete and recheck Wednesday  2/3- will recheck in AM  2/4- K+ 3.3- will replete 40 mEq x2 and recheck this weekend  2/8: K+ 3.0; ordered 56meq x2 today with repeat BMP tomorrow   morning.   2/9- K+ still 3.0- will check Mg level; start Magnesium Oxide 400 mg BID as well as K+  replete x3 40 mEq and recheck in AM  2/10- Mg 1.7- a little low, but already started PO repletion yesterday; K+ up to 3.7- finally got KCL to respond- will recheck on Friday to make sure staying up, since it kept staying llow 8.  Acute on chronic diastolic congestive heart failure.  Echocardiogram ejection fraction 50%.  Monitor for any signs of fluid overload.  Continue Demadex 20 mg twice daily             Daily weights.   Filed Weights   02/18/19 0510 02/19/19 0412 02/20/19 0413  Weight: (!) 155.6 kg (!) 155.6 kg (!) 158.8 kg    2/1- weight changed >30 lbs- will check into   2/2- same weight as yesterday.   2/3- no weight today  2/4- Weight 156k- up 1.3 kg- will monitor  2/5- weight back down to 151 kg- down 5 kg- unlikely  2/6- Weight 154.7 kg- makes more sense- will con't to monitor.   2/8: weight stable.  2/9- Weight 158.8 kg- up 2 kg? Will monitor for trend, since might not be that high   2/11- no weight today again- but WAS stable 9.  Diabetes mellitus with peripheral neuropathy.  Hemoglobin A1c 7.1.  NovoLog 15 units 3 times daily with meals, Lantus insulin 25 units twice daily.  Check blood sugars before meals and at bedtime.  Diabetic teaching   CBG (last 3)  Recent Labs    02/21/19 2353 02/22/19 0404 02/22/19 0815  GLUCAP 188* 176* 308*    2/1- just got Lantus increased yesterday-  will look at trend in AM and titrate Lantus  2/2- Most BGs 200s- will increase Lantus to 30 units BID and monitor  2/3- place consult for DM coordinator since used to be on insulin pump at home- will see how they can help- I appreciate the assistance  2/4- increased Lantus to 35 units BID and Novolog to 16 units TID with meals. Family to bring in insulin pump Friday? If so, will call DM coordinator to help.  2/5- will get DM coordinator to help if going to use insulin pump.   2/6- DM coordinator increased Lantus to 38 units BID, checking BGs more frequently and Novolog 0-15 units  2/7- increased Lantus to 42 units BID as of this evening  2/8: remain elevated but with improved control from yesterday.   2/9- back in insulin pump- will monitor  2/10- Called DM coordinator about BG basal starting at 8am- to move it back to her new breakfast time.  2/11- increasing BGs via DM coordinator by ~ 10%- via basal rate- BGs still out of control             Monitor with increased mobility 10.  Cardiogenic shock.  Wean from pressors.  Monitor for orthostasis.  Continue Coreg 12.5 mg twice daily  2/4- will reduce to 6.25 mg BID since BP soft last night/this AM at 90s/50s 11.  Super obesity.  BMI 54.50.  Dietary follow-up.  Encourage weight loss 12.  Hyperlipidemia.  Crestor 13.  History of anemia.  Continue iron supplement.  Latest hemoglobin 11.6.             2/1- Hb 13.7 14.  Sickle cell trait.  Follow-up outpatient. 15. Constipation  2/2- will give Mg citrate and soap suds enema today to get pt cleaned out.   2/3- cleaned out per pt- will con't to monitor- and see if need to increase bowel meds. Will add Senokot 1 tab  BID   2/4- LBM 2/4- will ask nursing to give prns  2/7- LBM yesterday afternon  2/10- having 2x/day- improved- con't regimen 16. L ear popping  2/7- will order Debrox drops for wax and see if helps. For L ear  2/10- improving finally per pt- will monitor  2/11- will clean out/irrigate L  ear if needed 17. Waist pressure ulcer  2/11- 5-6cm x <1 cm- superficial- will con't wound care and monitor- is granulating.     LOS: 13 days A FACE TO FACE EVALUATION WAS PERFORMED  Allen Basista 02/22/2019, 9:55 AM

## 2019-02-22 NOTE — Progress Notes (Signed)
Inpatient Diabetes Program Recommendations  AACE/ADA: New Consensus Statement on Inpatient Glycemic Control (2015)  Target Ranges:  Prepandial:   less than 140 mg/dL      Peak postprandial:   less than 180 mg/dL (1-2 hours)      Critically ill patients:  140 - 180 mg/dL   Lab Results  Component Value Date   GLUCAP 308 (H) 02/22/2019   HGBA1C 7.1 (H) 01/13/2019    Review of Glycemic Control  Diabetes history:DM 2 Outpatient Diabetes medications: insulin pump, Metformin 1000 gm bid, Semaglutide 0.25 mg weekly  Medtronic insulin pump Average total insulin daily 152 units/day (basal insulin=78 units daily) 12 am - 2.5 units/hr 8 am- 3.8 units/hr 12 pm- 3.7 units/hr 4 pm- 4.0 units/hr 10 pm- 3.75 units/hr  Her insulin to carb ratio is now 1:5 and her sensitivity is 1:25.  Current orders for Inpatient glycemic control:  Insulin pump order set  Due to glucose being so elevated. Charlesetta Ivory, CDE, pump trainer early from Dr. Thompson Caul office.   Inpatient Diabetes Program Recommendations:    Per Opal Sidles, CDE, change correction factor to 1 unit dropping pt 20 points (previously 1:25) Change carb coverage to 1 unit for every 4 grams of carbs (currently 1:5)  Changes insulin basal rates with a 10% increase on all rates which will be: 12 am-  2.75  units/hr 8 am-    4.20  units/hr 12 pm-  4.10  units/hr 4 pm-    4.40   units/hr 10 pm-  4.15  units/hr  Spoke with Reesa Chew, PA. Will go ahead and make changes as discussed with insulin pump.  Thanks,  Tama Headings RN, MSN, BC-ADM Inpatient Diabetes Coordinator Team Pager [phone] (8a-5p)

## 2019-02-22 NOTE — Progress Notes (Signed)
Inpatient Diabetes Program Recommendations  AACE/ADA: New Consensus Statement on Inpatient Glycemic Control (2015)  Target Ranges:  Prepandial:   less than 140 mg/dL      Peak postprandial:   less than 180 mg/dL (1-2 hours)      Critically ill patients:  140 - 180 mg/dL   Lab Results  Component Value Date   GLUCAP 308 (H) 02/22/2019   HGBA1C 7.1 (H) 01/13/2019    Review of Glycemic Control Results for Kaitlin [name], Kaitlin [name] (MRN [id number]) as of 02/22/2019 10:17  Ref. Range 02/21/2019 20:20 02/21/2019 23:53 02/22/2019 04:04 02/22/2019 08:15  Glucose-Capillary Latest Ref Range: 70 - 99 mg/dL 268 (H) 188 (H) 176 (H) 308 (H)   Spoke with patient and assisted with insulin pump changes. Patient expresses feelings of fear of hypoglycemia from 0000-0800. Reviewed current glucose trends and target goals, however, patient hesitant to make all suggested changes. Together, we compromised on the adjustments based off this fear, however, discussed the likely hood of needing more insulin. Patient comparing insulin requirements with pre-hospital admission. Also, discussed decreased activity factor and need for more adjustments.  Of note, patient has 670G and the rates would not exceed 4.0 units/hr. Assuming this is due to safety in programming, when pump was initially set up. Given this and patient's fear, will increase as below, however, may need to change set up in pump if further hyperglycemia persists.  Per Opal Sidles, CDE, change correction factor to 1 unit dropping pt 20 points (previously 1:25) Change carb coverage to 1 unit for every 4 grams of carbs (currently 1:5)  12 am-  2.75  units/hr 8 am-    4.0  units/hr 12 pm-  4.0  units/hr 4 pm-    4.0   units/hr 10 pm-  3.75  units/hr  Thanks, Bronson Curb, MSN, RNC-OB Diabetes Coordinator [phone] (8a-5p)

## 2019-02-23 ENCOUNTER — Inpatient Hospital Stay (HOSPITAL_COMMUNITY): Payer: Medicaid Other

## 2019-02-23 ENCOUNTER — Ambulatory Visit (HOSPITAL_COMMUNITY): Payer: Medicaid Other | Admitting: *Deleted

## 2019-02-23 ENCOUNTER — Inpatient Hospital Stay (HOSPITAL_COMMUNITY): Payer: Medicaid Other | Admitting: Occupational Therapy

## 2019-02-23 LAB — GLUCOSE, CAPILLARY
Glucose-Capillary: 140 mg/dL — ABNORMAL HIGH (ref 70–99)
Glucose-Capillary: 152 mg/dL — ABNORMAL HIGH (ref 70–99)
Glucose-Capillary: 162 mg/dL — ABNORMAL HIGH (ref 70–99)
Glucose-Capillary: 169 mg/dL — ABNORMAL HIGH (ref 70–99)
Glucose-Capillary: 176 mg/dL — ABNORMAL HIGH (ref 70–99)
Glucose-Capillary: 277 mg/dL — ABNORMAL HIGH (ref 70–99)

## 2019-02-23 MED ORDER — SEMAGLUTIDE(0.25 OR 0.5MG/DOS) 2 MG/1.5ML ~~LOC~~ SOPN
0.2500 mg | PEN_INJECTOR | SUBCUTANEOUS | Status: DC
  Administered 2019-02-23 – 2019-03-09 (×3): 0.25 mg via SUBCUTANEOUS

## 2019-02-23 NOTE — Progress Notes (Signed)
Occupational Therapy Session Note  Patient Details  Name: Kaitlin [name] MRN: [id number] Date of Birth: 07-02-1965  Today's Date: 02/23/2019 OT Individual Time: 3903-0092 OT Individual Time Calculation (min): 60 min   Short Term Goals: Week 2:  OT Short Term Goal 1 (Week 2): patient will complete rolling in bed with min A of one OT Short Term Goal 2 (Week 2): patient will complete sit to/from supine with mod A of one OT Short Term Goal 3 (Week 2): patient will complete UB bathing and dressing with mod A  Skilled Therapeutic Interventions/Progress Updates:    Pt greeted in recliner via OT handoff. ADL needs were met and pt requested for seated therex during tx due to fatigue from previous session. Tx focus was placed on core strengthening and gentle ROM/isometric strengthening via chair yoga participation. Started with diaphragmatic breathing education, emphasizing activation of primary vs accessory muscles for respiration. Had her take five deep breaths with hands on belly, very difficult for her to exhale longer than inhalation. Instructed pt to use this breath technique throughout exercise engagement while breathing out with exertion to better coordinate breathing with functional movement. Pt performed all exercises edge of chair, including but not limited to spinal twists, lateral bends, and flexion/extension of thoracic spine (cat/cow poses). Educated pt that gentle ROM of spine can also help to improve her resting back pain. Also guided pt through pressure relief positions, including anterior + lateral leans and small push ups from recliner. Pt activating B UEs during pressure relief via isometric and eccentric techniques when given instruction. Pt unable to clear buttocks during push ups but did exhibit visible unweighting of buttocks. At end of session provided pt with lavender to use for aromatherapy. We discussed how calming aromatherapy can increase PNS activation and therefore help promote  mindful diaphragmatic breathing at rest. Encouraged her to use the breathing techniques discussed today independently in room, also encouraged independent engagement of pressure relief. Pt verbalized understanding. Left her with all needs within reach.   Saebo Stim One was applied to Lt shoulder at start of session. She tolerated stim well, pads removed with skin intact at end of session. Placed Saebo on Rt shoulder with primary OT verbalizing she will check on pt within the hour. Stim parameters listed below for e-stim attended:   Saebo Stim One 330 pulse width 35 Hz pulse rate On 8 sec/ off 8 sec Ramp up/ down 2 sec Symmetrical Biphasic wave form  Max intensity 125m at 500 Ohm load SAEBO    Therapy Documentation Precautions:  Precautions Precautions: Fall Precaution Comments: morbid obesity, monitor SpO2 and HR Restrictions Weight Bearing Restrictions: No Pain: Lt shoulder. RN in at start of session to apply analgesic cream to B shoulder areas Pain Assessment Pain Scale: 0-10 Pain Score: 5  Pain Type: Acute pain Pain Location: Back Pain Orientation: Lower Pain Descriptors / Indicators: Aching Pain Onset: Gradual Patients Stated Pain Goal: 2 Pain Intervention(s): Medication (See eMAR) ADL: ADL Eating: Minimal assistance Where Assessed-Eating: Bed level Grooming: Minimal assistance Where Assessed-Grooming: Bed level Upper Body Bathing: Maximal assistance Where Assessed-Upper Body Bathing: Bed level Lower Body Bathing: Dependent Where Assessed-Lower Body Bathing: Bed level Upper Body Dressing: Maximal assistance Where Assessed-Upper Body Dressing: Bed level Lower Body Dressing: Dependent Where Assessed-Lower Body Dressing: Bed level Toileting: Dependent Where Assessed-Toileting: Bed level Toilet Transfer: Other (comment)(maximove bed to w/c today, unsafe at this time to transfer to commode) ADL Comments: significant fatigue with adl tasks  Therapy/Group:  Individual Therapy  Soffia Doshier A Bryam Taborda 02/23/2019, 12:42 PM

## 2019-02-23 NOTE — Progress Notes (Signed)
Inpatient Diabetes Program Recommendations  AACE/ADA: New Consensus Statement on Inpatient Glycemic Control (2015)  Target Ranges:  Prepandial:   less than 140 mg/dL      Peak postprandial:   less than 180 mg/dL (1-2 hours)      Critically ill patients:  140 - 180 mg/dL   Results for Kaitlin [name], Kaitlin [name] (MRN [id number]) as of 02/23/2019 10:50  Ref. Range 02/21/2019 23:53 02/22/2019 04:04 02/22/2019 08:15 02/22/2019 11:17 02/22/2019 16:35 02/22/2019 20:24  Glucose-Capillary Latest Ref Range: 70 - 99 mg/dL 188 (H) 176 (H) 308 (H)  9 units NOVOLOG  283 (H)  10 units NOVOLOG  150 (H)  6.4 units NOVOLOG Given at 6:46pm 119 (H)   Results for Kaitlin [name], Kaitlin [name] (MRN [id number]) as of 02/23/2019 10:50  Ref. Range 02/23/2019 00:36 02/23/2019 04:12 02/23/2019 08:21  Glucose-Capillary Latest Ref Range: 70 - 99 mg/dL 162 (H) 152 (H) 277 (H)  17.9 units NOVOLOG    Diabetes history:DM 2  Outpatient Diabetes meds: insulin pump, Metformin 1000 gm bid, Semaglutide 0.25 mg weekly  Current Orders:Insulin Pump     Ozempic 0.25 mg QFriday     Note that Insulin pump settings were adjusted by the patient yesterday under recommendations from pt's ENDO practice.  DM Coordinator observed pt make those changes.  Current Pump settings are as follows:  Correction factor 1 unit dropping pt 20 points (previously 1:25) Carb coverage 1 unit for every 4 grams of carbs (currently 1:5)  12 am-2.75units/hr 8 am-4.0units/hr 12 pm-4.0units/hr 4 pm-4.0units/hr 10 pm-3.75units/hr  Spoke w/ Pt ENDO practice this AM Marland KitchenMickel Duhamel, RN, CDE).  Reviewed CBGs and insulin doses.  Reviewed with [name] that plan is to have pt resume her Ozempic this AM (pt brought from home).  [name] recommended that we not make any insulin pump changes today since plan is to resume the Ozempic.  [name] did remind [place] that pt could have CBGs drop with addition of Ozempic once weekly and taht if pt has issues with Hypoglycemia we should  have pt resume her previous insulin pump settings that were on the pump prior to yesterday.  Arthor Captain, [place] with Rehab.  Reviewed above conversation with [name].  Note that ozempic orders placed.  Relayed to PA that no insulin pump adjustments recommended at this time.  Also reviewed with RN new Ozempic orders.    --Will follow patient during hospitalization--  Wyn Quaker RN, MSN, CDE Diabetes Coordinator Inpatient Glycemic Control Team Team Pager: [phone] (8a-5p)

## 2019-02-23 NOTE — Plan of Care (Signed)
  Problem: Consults Goal: RH GENERAL PATIENT EDUCATION Description: See Patient Education module for education specifics. Outcome: Progressing Goal: Skin Care Protocol Initiated - if Braden Score 18 or less Description: If consults are not indicated, leave blank or document N/A Outcome: Progressing   Problem: RH BLADDER ELIMINATION Goal: RH STG MANAGE BLADDER WITH ASSISTANCE Description: STG Manage Bladder With mod Assistance Outcome: Progressing   Problem: RH SKIN INTEGRITY Goal: RH STG SKIN FREE OF INFECTION/BREAKDOWN Description: Pt will be free of skin breakdown or pressure ulcers prior to DC with mod assist Outcome: Progressing Goal: RH STG MAINTAIN SKIN INTEGRITY WITH ASSISTANCE Description: STG Maintain Skin Integrity With mod Assistance. Outcome: Progressing Goal: RH STG ABLE TO PERFORM INCISION/WOUND CARE W/ASSISTANCE Description: STG Able To Perform Incision/Wound Care With mod Assistance. Outcome: Progressing   Problem: RH SAFETY Goal: RH STG ADHERE TO SAFETY PRECAUTIONS W/ASSISTANCE/DEVICE Description: STG Adhere to Safety Precautions With mod Assistance/Device. Outcome: Progressing

## 2019-02-23 NOTE — Progress Notes (Signed)
Occupational Therapy Session Note  Patient Details  Name: Kaitlin [name] MRN: [id number] Date of Birth: 02-26-1965  Today's Date: 02/23/2019 OT Individual Time: 6384-5364 OT Individual Time Calculation (min): 65 min    Short Term Goals: Week 1:  OT Short Term Goal 1 (Week 1): patient will complete rolling in bed with min A of one OT Short Term Goal 1 - Progress (Week 1): Progressing toward goal OT Short Term Goal 2 (Week 1): patient will complete sit to/from supine with mod A of one OT Short Term Goal 2 - Progress (Week 1): Progressing toward goal OT Short Term Goal 3 (Week 1): patient will tolerate sitting in w/c or recliner 1-2 hours per day OT Short Term Goal 3 - Progress (Week 1): Met OT Short Term Goal 4 (Week 1): patient will complete UB bathing and dressing with mod A OT Short Term Goal 4 - Progress (Week 1): Progressing toward goal  Skilled Therapeutic Interventions/Progress Updates:    1;1. Pt received in bed agreeable to ADL retraining at sink level. Pt complete supine>sitting EOB with A for trunk management and transfers via ambulation with RW with MIN A +2 to power up from EOB and walk to w/c then recliner at end of session ~6-10 feet. Pt completes bathing at sink level with LHSS to wash B feet. instruction to use reacher to doff socks. OT dons teds and shoes for time. Pt completes sit to stand at sink from w/c with RW at MOD A level with total A to wash peri area and buttocks. HOH A provided to wash R underarm with LUE and pt able to wash L underarm this date. New gown donned total A. Grooming at sink with set up. Exited session after application of saebo stim to L shoulder (deltoid) for 60 min. MD requesting we complete Estim on B shoulders.  Saebo Stim One 330 pulse width 35 Hz pulse rate On 8 sec/ off 8 sec Ramp up/ down 2 sec Symmetrical Biphasic wave form  Max intensity 166m at 500 Ohm load  Exited session with next OT entering room willing to apply estim to alternate  shoulder at end of session  Therapy Documentation Precautions:  Precautions Precautions: Fall Precaution Comments: morbid obesity, monitor SpO2 and HR Restrictions Weight Bearing Restrictions: No General:   Vital Signs:   Pain:   ADL: ADL Eating: Minimal assistance Where Assessed-Eating: Bed level Grooming: Minimal assistance Where Assessed-Grooming: Bed level Upper Body Bathing: Maximal assistance Where Assessed-Upper Body Bathing: Bed level Lower Body Bathing: Dependent Where Assessed-Lower Body Bathing: Bed level Upper Body Dressing: Maximal assistance Where Assessed-Upper Body Dressing: Bed level Lower Body Dressing: Dependent Where Assessed-Lower Body Dressing: Bed level Toileting: Dependent Where Assessed-Toileting: Bed level Toilet Transfer: Other (comment)(maximove bed to w/c today, unsafe at this time to transfer to commode) ADL Comments: significant fatigue with adl tasks Vision   Perception    Praxis   Exercises:   Other Treatments:     Therapy/Group: Individual Therapy  STonny Branch2/12/2019, 8:56 AM

## 2019-02-23 NOTE — Progress Notes (Signed)
Recreational Therapy Session Note  Patient Details  Name: Kaitlin [name] MRN: [id number] Date of Birth: 1965-02-11 Today's Date: 02/23/2019 Time:  11/3-12 Pain: no c/o Skilled Therapeutic Interventions/Progress Updates: Pt reported high blood sugars and nt feeling well today but agreeable to try.  Session focused on activity tolerance, static standing balance, bed mobility, increasing mood during co-treat with PT.  Pt performed sit-stand X3 and stand pivot transfer from recliner chair to bed with min assist +2, encouragement needed.  Utilized music of choice to help with mood & motivation throughout session.  Discussion with pt about fluctuating blood sugars and how they impacted her therapies.  Pt requesting to modify her therapy schedule around the times that her blood sugar seems to limit her ability to participate fully in scheduled session.  Team made aware.  Pt alert, smiling and at times laughing by the end of the session.  Therapy/Group: Co-Treatment   Moreen Piggott 02/23/2019, 12:25 PM

## 2019-02-23 NOTE — Progress Notes (Signed)
[facility] PHYSICAL MEDICINE & REHABILITATION PROGRESS NOTE   Subjective/Complaints:   Pt reports her L ear is still bubbling a little, but did get a lot out of L ear/wax wise yesterday.   Notes that her BGs get to 170 ~ 10 pm at night- concerns pt that might drop too low at night and so eats peanut butter and crackers when BGs 170-  Also pees a LOT when BGs <200-   Also c/o R shoulder pain now- that's new- was L shoulder, but as she babies L shoulder, R shoulder getting worse.   Walked 10 ft with RW mod+ assist.    ROS- pt denies SOB, CP, abd pain; N/V/C/D/ vision changes, Headache. Objective:   No results found. No results for input(s): WBC, HGB, HCT, PLT in the last 72 hours. Recent Labs    02/21/19 0528  NA 141  K 3.7  CL 100  CO2 29  GLUCOSE 193*  BUN 15  CREATININE 0.87  CALCIUM 9.7    Intake/Output Summary (Last 24 hours) at 02/23/2019 0940 Last data filed at 02/22/2019 1900 Gross per 24 hour  Intake 320 ml  Output --  Net 320 ml     Physical Exam: Vital Signs Blood pressure 107/69, pulse 91, temperature 99 F (37.2 C), temperature source Oral, resp. rate 18, height 5\' 9"  (1.753 m), weight (!) 159.7 kg, last menstrual period 06/17/2012, SpO2 100 %. Vitals reviewed. Constitutional: . n bariatric bed; sitting/standing right on EOB with PT and PTA in room; pt about to walk, brighter affect, NAD HENT:  Head: Normocephalic.  Facial abrasisions esp on nose- healing but scarring lighter color than expected - skin dry on face Eyes: EOM are normal. Neck: No tracheal deviation present.  CV: RRR- no M/R/G Respiratory: decreased breath sounds at bases-otherwise good air movement ; no W/R/R GI: Soft. NT, ND, protuberant, (+)BS again-  Musculoskeletal:     Comments: LE edema Very tight pecs B/L but also TTP over R deltoid and posterior R shoulder  Neurological: She is alert.   RUE: shoulder abduction 2+/5, elbow flexion/extension 3-/5, hand grip 4-/5. Tenderness to  palpation over right shoulder joint.  LUE: shoulder abduction 2/5, elbow flexion/extension 2+/5, hand grip 3/5 B/l LE: HF, KE 3+/5, ADF 4-/5 Sensation intact to light touch  Skin: Skin is warm and dry.  Sacral ulcer healed; \ Assessed again this AM- Has a pressure ulcer on back of waist/on back- 5-6 cm by <1 cm and very superficial but slightly open- granulating well. Looks good.  See above  Psychiatric: brighter affect    Assessment/Plan: 1. Functional deficits secondary to critical illness myopathy which require 3+ hours per day of interdisciplinary therapy in a comprehensive inpatient rehab setting.  Physiatrist is providing close team supervision and 24 hour management of active medical problems listed below.  Physiatrist and rehab team continue to assess barriers to discharge/monitor patient progress toward functional and medical goals  Care Tool:  Bathing    Body parts bathed by patient: Abdomen, Face, Chest, Front perineal area   Body parts bathed by helper: Right arm, Left arm, Chest, Abdomen, Front perineal area, Buttocks, Right upper leg, Left upper leg, Right lower leg, Left lower leg     Bathing assist Assist Level: Maximal Assistance - Patient 24 - 49%     Upper Body Dressing/Undressing Upper body dressing   What is the patient wearing?: Hospital gown only    Upper body assist Assist Level: Maximal Assistance - Patient 25 - 49%  Lower Body Dressing/Undressing Lower body dressing      What is the patient wearing?: Incontinence brief     Lower body assist Assist for lower body dressing: 2 Helpers     Toileting Toileting    Toileting assist Assist for toileting: Maximal Assistance - Patient 25 - 49%(perihygiene at bed level)     Transfers Chair/bed transfer  Transfers assist  Chair/bed transfer activity did not occur: Safety/medical concerns  Chair/bed transfer assist level: 2 Helpers     Locomotion Ambulation   Ambulation assist    Ambulation activity did not occur: Safety/medical concerns  Assist level: 2 helpers Assistive device: Walker-rolling Max distance: 10'   Walk 10 feet activity   Assist  Walk 10 feet activity did not occur: Safety/medical concerns  Assist level: 2 helpers Assistive device: Walker-rolling   Walk 50 feet activity   Assist Walk 50 feet with 2 turns activity did not occur: Safety/medical concerns         Walk 150 feet activity   Assist Walk 150 feet activity did not occur: Safety/medical concerns         Walk 10 feet on uneven surface  activity   Assist Walk 10 feet on uneven surfaces activity did not occur: Safety/medical concerns         Wheelchair     Assist Will patient use wheelchair at discharge?: Yes(TBD) Type of Wheelchair: Manual(Per chart review LT goals set for dischrage )           Wheelchair 50 feet with 2 turns activity    Assist    Wheelchair 50 feet with 2 turns activity did not occur: Safety/medical concerns       Wheelchair 150 feet activity     Assist  Wheelchair 150 feet activity did not occur: Safety/medical concerns       Blood pressure 107/69, pulse 91, temperature 99 F (37.2 C), temperature source Oral, resp. rate 18, height 5\' 9"  (1.753 m), weight (!) 159.7 kg, last menstrual period 06/17/2012, SpO2 100 %.   Medical Problem List and Plan: 1.  Decreased functional mobility secondary to CIM due to acute hypoxemic respiratory failure due to ARDS/COVID-19 pneumonia.  Extubated 01/31/2019             -patient may shower             -ELOS/Goals: 27-32 days/Mod A            Continue CIR therapies.    -satting well on room air, changed O2 to PRN. 2.  Antithrombotics: -DVT/anticoagulation: Lovenox.  Venous Doppler studies negative             -antiplatelet therapy: Aspirin 81 mg daily 3. Pain Management: Tylenol as needed  2/5- added voltaren gel for L shoulder QID  2/8: added lidocaine patch for right  shoulder.  2/11 - needs some muscle relaxants for L muscular chest pain- will try flexeril 5mg  TID prn for muscle tightness  2/12- asking PT to do Estim in B/L shoulders due to pain/quesitonable subluxation which could be adding to pain 4. Mood: Provide emotional support             -antipsychotic agents: N/A 5. Neuropsych: This patient is capable of making decisions on her own behalf. 6. Skin/Wound Care: Routine skin checks  2/8: facial abraisons healing well.  7. Fluids/Electrolytes/Nutrition: Routine in and outs.    2/1- Hypokalemia- 3.1- will replete and recheck Wednesday  2/3- will recheck in AM  2/4- K+ 3.3- will  replete 40 mEq x2 and recheck this weekend  2/8: K+ 3.0; ordered 70meq x2 today with repeat BMP tomorrow   morning.   2/9- K+ still 3.0- will check Mg level; start Magnesium Oxide 400 mg BID as well as K+ replete x3 40 mEq and recheck in AM  2/10- Mg 1.7- a little low, but already started PO repletion yesterday; K+ up to 3.7- finally got KCL to respond- will recheck on Friday to make sure staying up, since it kept staying llow 8.  Acute on chronic diastolic congestive heart failure.  Echocardiogram ejection fraction 50%.  Monitor for any signs of fluid overload.  Continue Demadex 20 mg twice daily             Daily weights.   Filed Weights   02/19/19 0412 02/20/19 0413 02/23/19 0415  Weight: (!) 155.6 kg (!) 158.8 kg (!) 159.7 kg    2/1- weight changed >30 lbs- will check into   2/2- same weight as yesterday.   2/3- no weight today  2/4- Weight 156k- up 1.3 kg- will monitor  2/5- weight back down to 151 kg- down 5 kg- unlikely  2/6- Weight 154.7 kg- makes more sense- will con't to monitor.   2/8: weight stable.  2/9- Weight 158.8 kg- up 2 kg? Will monitor for trend, since might not be that high   2/11- no weight today again- but WAS stable  2/12- weight now 159.7 kg - is just conitnuing to rise??? 9.  Diabetes mellitus with peripheral neuropathy.  Hemoglobin A1c 7.1.   NovoLog 15 units 3 times daily with meals, Lantus insulin 25 units twice daily.  Check blood sugars before meals and at bedtime.  Diabetic teaching   CBG (last 3)  Recent Labs    02/23/19 0036 02/23/19 0412 02/23/19 0821  GLUCAP 162* 152* 277*    2/3- place consult for DM coordinator since used to be on insulin pump at home- will see how they can help- I appreciate the assistance  2/4- increased Lantus to 35 units BID and Novolog to 16 units TID with meals. Family to bring in insulin pump Friday? If so, will call DM coordinator to help.  2/5- will get DM coordinator to help if going to use insulin pump.   2/6- DM coordinator increased Lantus to 38 units BID, checking BGs more frequently and Novolog 0-15 units  2/7- increased Lantus to 42 units BID as of this evening  2/8: remain elevated but with improved control from yesterday.   2/9- back in insulin pump- will monitor  2/10- Called DM coordinator about BG basal starting at 8am- to move it back to her new breakfast time.  2/11- increasing BGs via DM coordinator by ~ 10%- via basal rate- BGs still out of control  2/12- Pt wants ot start back Ozempic which is home med- brought in yesterday from home since hospital doesn't stock- will ask DM coordinator to start it.              Monitor with increased mobility 10.  Cardiogenic shock.  Wean from pressors.  Monitor for orthostasis.  Continue Coreg 12.5 mg twice daily  2/4- will reduce to 6.25 mg BID since BP soft last night/this AM at 90s/50s 11.  Super obesity.  BMI 54.50.  Dietary follow-up.  Encourage weight loss 12.  Hyperlipidemia.  Crestor 13.  History of anemia.  Continue iron supplement.  Latest hemoglobin 11.6.  2/1- Hb 13.7 14.  Sickle cell trait.  Follow-up outpatient. 15. Constipation  2/2- will give Mg citrate and soap suds enema today to get pt cleaned out.   2/3- cleaned out per pt- will con't to monitor- and see if need to increase bowel meds. Will add Senokot 1  tab BID   2/4- LBM 2/4- will ask nursing to give prns  2/7- LBM yesterday afternon  2/10- having 2x/day- improved- con't regimen 16. L ear popping  2/7- will order Debrox drops for wax and see if helps. For L ear  2/10- improving finally per pt- will monitor  2/11- will clean out/irrigate L ear if needed 17. Waist pressure ulcer  2/11- 5-6cm x <1 cm- superficial- will con't wound care and monitor- is granulating.   2/12- looking slightly better.     LOS: 14 days A FACE TO FACE EVALUATION WAS PERFORMED  Yaneli Keithley 02/23/2019, 9:40 AM

## 2019-02-23 NOTE — Progress Notes (Signed)
Patient upset that dinner has been sitting on bedside table for over an hour.  She says she called for someone to help her with set up but no one came.  This RN answered call light and patient stated she also needed to urinate.  This RN went to bedside where patient had already had episode of urinary incontinence.  Performed peri care.  Patient asked for me to take away her dinner tray as it is now cold.  When asked if she would like anything from the nurses station, she requested only peanut butter and graham crackers which were provided since she did not/will not eat her dinner tray and cafeteria only serving limited options.  Brita Romp, RN

## 2019-02-23 NOTE — Progress Notes (Signed)
Physical Therapy Session Note  Patient Details  Name: QUEENIE AUFIERO MRN: [id number] Date of Birth: 1965-10-06  Today's Date: 02/23/2019 PT Individual Time: 1104-1200 PT Individual Time Calculation (min): 56 min   Short Term Goals: Week 1:  PT Short Term Goal 1 (Week 1): Pt will perform R/L rolling in bed with +2 mod assist PT Short Term Goal 1 - Progress (Week 1): Met PT Short Term Goal 2 (Week 1): Pt will perform supine<>sit with +2 mod assist PT Short Term Goal 2 - Progress (Week 1): Met PT Short Term Goal 3 (Week 1): Pt will initiate sit>stand using lift equipment as needed PT Short Term Goal 3 - Progress (Week 1): Met PT Short Term Goal 4 (Week 1): Pt will tolerate sitting OOB in w/c for at least 30 minutes PT Short Term Goal 4 - Progress (Week 1): Met Week 2:  PT Short Term Goal 1 (Week 2): Pt will complete bed mobility with assist x 1 PT Short Term Goal 2 (Week 2): Pt will complete stand pivot transfer with RW and assist x 2 PT Short Term Goal 3 (Week 2): Pt will ambulate x 5 ft with LRAD and assist x 2 Week 3:     Skilled Therapeutic Interventions/Progress Updates:    PAIN Denies pain, states she isn't feeling well w/blood sugars just over 300.  Encouraged pt to participate/explained that activity may help to reduce BS.   Pt initially OOB in recliner and agreeable to treatment session with focus on  Mobility, standing tolerance, global strengthening.  Pt performed STS from recliner and standing endurance/tolerance as follows: STS from recliner w/mod assist x 2, stood x 1.5 min w/cga Rest x 5 min in sitting STS from recliner w/min assist x , stood x 1 min while therapists donned appropriate sized brief, cga for balance Rest x 5 min sitting STS from recliner w/min assist of 2, stood x 1 min Rest x 5 min STS from recliner, stood x 30sec, turn 90degrees, sidestep 8f to head of bed w/RW, back x 1 step all w/cga of 2 w/rw.  Stand/sit to edge of bed w/cga.  Seated  therex: Performed LAQs, ankle pumps, AAROM rows 2 sets x 5 each w/cga for balance. Sit to supine w/max assist for LE management. In supine performed the following therex: AAROM hip abd/add AROM heel slides  Bed mobility: Worked on rolling and performed rolling to R w/cues for sequencing/LE positioning and use of rail, completes approx 25% and max assist for full roll.  Repeated x 5 Rolling to L w/cues and max assist. Scooting in bed using RUE and overhead rail w/cues for sequencing, max assist of 2 w/bed flat, mod assist w/bed in trendelenberg  Repeated x 2 brigding- able to partial bridge w/cues for sequencing, repeated x 5  Pt left supine w/rails up x 3, alarm set, bed in lowest position, and needs in reach.         Therapy Documentation Precautions:  Precautions Precautions: Fall Precaution Comments: morbid obesity, monitor SpO2 and HR Restrictions Weight Bearing Restrictions: No    Therapy/Group: Individual Therapy  BCallie Fielding PT   BJerrilyn Cairo2/12/2019, 12:28 PM

## 2019-02-24 ENCOUNTER — Other Ambulatory Visit: Payer: Self-pay | Admitting: Internal Medicine

## 2019-02-24 ENCOUNTER — Inpatient Hospital Stay (HOSPITAL_COMMUNITY): Payer: Medicaid Other | Admitting: Occupational Therapy

## 2019-02-24 DIAGNOSIS — A419 Sepsis, unspecified organism: Secondary | ICD-10-CM

## 2019-02-24 DIAGNOSIS — J80 Acute respiratory distress syndrome: Secondary | ICD-10-CM

## 2019-02-24 DIAGNOSIS — U071 COVID-19: Secondary | ICD-10-CM

## 2019-02-24 LAB — GLUCOSE, CAPILLARY
Glucose-Capillary: 168 mg/dL — ABNORMAL HIGH (ref 70–99)
Glucose-Capillary: 265 mg/dL — ABNORMAL HIGH (ref 70–99)
Glucose-Capillary: 115 mg/dL — ABNORMAL HIGH (ref 70–99)
Glucose-Capillary: 182 mg/dL — ABNORMAL HIGH (ref 70–99)
Glucose-Capillary: 150 mg/dL — ABNORMAL HIGH (ref 70–99)

## 2019-02-24 MED ORDER — SENNA 8.6 MG PO TABS
2.0000 | ORAL_TABLET | Freq: Two times a day (BID) | ORAL | Status: DC
  Administered 2019-02-24 – 2019-03-10 (×27): 17.2 mg via ORAL
  Filled 2019-02-24 (×29): qty 2

## 2019-02-24 NOTE — Progress Notes (Signed)
[facility] PHYSICAL MEDICINE & REHABILITATION PROGRESS NOTE   Subjective/Complaints:   Per nursing patient has self administered approximately 25 units of NovoLog this morning Last 2 meals the patient has eaten 100%  ROS- pt denies SOB, CP, abd pain; N/V/C/D/ vision changes, Headache. Objective:   No results found. No results for input(s): WBC, HGB, HCT, PLT in the last 72 hours. No results for input(s): NA, K, CL, CO2, GLUCOSE, BUN, CREATININE, CALCIUM in the last 72 hours.  Intake/Output Summary (Last 24 hours) at 02/24/2019 1055 Last data filed at 02/24/2019 0900 Gross per 24 hour  Intake 240 ml  Output --  Net 240 ml     Physical Exam: Vital Signs Blood pressure 103/62, pulse (!) 109, temperature 97.6 F (36.4 C), temperature source Oral, resp. rate 18, height 5\' 9"  (1.753 m), weight (!) 157.9 kg, last menstrual period 06/17/2012, SpO2 97 %. Vitals reviewed. Constitutional: . n bariatric bed; sitting/standing right on EOB with PT and PTA in room; pt about to walk, brighter affect, NAD HENT:  Head: Normocephalic.  Facial abrasisions esp on nose- healing but scarring lighter color than expected - skin dry on face Eyes: EOM are normal. Neck: No tracheal deviation present.  CV: RRR- no M/R/G Respiratory: decreased breath sounds at bases-otherwise good air movement ; no W/R/R GI: Soft. NT, ND, protuberant, (+)BS again-  Musculoskeletal:     Comments: LE edema Very tight pecs B/L but also TTP over R deltoid and posterior R shoulder  Neurological: She is alert.   RUE: shoulder abduction 2+/5, elbow flexion/extension 3-/5, hand grip 4-/5. Tenderness to palpation over right shoulder joint.  LUE: shoulder abduction 2/5, elbow flexion/extension 2+/5, hand grip 3/5 B/l LE: HF, KE 3+/5, ADF 4-/5 Sensation intact to light touch  Skin: Skin is warm and dry.   Psychiatric: brighter affect    Assessment/Plan: 1. Functional deficits secondary to critical illness myopathy which  require 3+ hours per day of interdisciplinary therapy in a comprehensive inpatient rehab setting.  Physiatrist is providing close team supervision and 24 hour management of active medical problems listed below.  Physiatrist and rehab team continue to assess barriers to discharge/monitor patient progress toward functional and medical goals  Care Tool:  Bathing    Body parts bathed by patient: Abdomen, Face, Chest, Front perineal area   Body parts bathed by helper: Right arm, Left arm, Chest, Abdomen, Front perineal area, Buttocks, Right upper leg, Left upper leg, Right lower leg, Left lower leg     Bathing assist Assist Level: Maximal Assistance - Patient 24 - 49%     Upper Body Dressing/Undressing Upper body dressing   What is the patient wearing?: Hospital gown only    Upper body assist Assist Level: Maximal Assistance - Patient 25 - 49%    Lower Body Dressing/Undressing Lower body dressing      What is the patient wearing?: Incontinence brief     Lower body assist Assist for lower body dressing: 2 Helpers     Toileting Toileting    Toileting assist Assist for toileting: Maximal Assistance - Patient 25 - 49%(perihygiene at bed level)     Transfers Chair/bed transfer  Transfers assist  Chair/bed transfer activity did not occur: Safety/medical concerns  Chair/bed transfer assist level: 2 Helpers     Locomotion Ambulation   Ambulation assist   Ambulation activity did not occur: Safety/medical concerns  Assist level: 2 helpers Assistive device: Walker-rolling Max distance: 4   Walk 10 feet activity   Assist  Walk 10 feet activity did not occur: Safety/medical concerns  Assist level: 2 helpers Assistive device: Walker-rolling   Walk 50 feet activity   Assist Walk 50 feet with 2 turns activity did not occur: Safety/medical concerns         Walk 150 feet activity   Assist Walk 150 feet activity did not occur: Safety/medical concerns          Walk 10 feet on uneven surface  activity   Assist Walk 10 feet on uneven surfaces activity did not occur: Safety/medical concerns         Wheelchair     Assist Will patient use wheelchair at discharge?: Yes(TBD) Type of Wheelchair: Manual(Per chart review LT goals set for dischrage )           Wheelchair 50 feet with 2 turns activity    Assist    Wheelchair 50 feet with 2 turns activity did not occur: Safety/medical concerns       Wheelchair 150 feet activity     Assist  Wheelchair 150 feet activity did not occur: Safety/medical concerns       Blood pressure 103/62, pulse (!) 109, temperature 97.6 F (36.4 C), temperature source Oral, resp. rate 18, height 5\' 9"  (1.753 m), weight (!) 157.9 kg, last menstrual period 06/17/2012, SpO2 97 %.   Medical Problem List and Plan: 1.  Decreased functional mobility secondary to CIM due to acute hypoxemic respiratory failure due to ARDS/COVID-19 pneumonia.  Extubated 01/31/2019             -patient may shower             -ELOS/Goals: 27-32 days/Mod A            Continue CIR PT, OT  -satting well on room air, changed O2 to PRN. 2.  Antithrombotics: -DVT/anticoagulation: Lovenox.  Venous Doppler studies negative             -antiplatelet therapy: Aspirin 81 mg daily 3. Pain Management: Tylenol as needed no pain complaints 2/13  2/5- added voltaren gel for L shoulder QID  2/8: added lidocaine patch for right shoulder.  2/11 - needs some muscle relaxants for L muscular chest pain- will try flexeril 5mg  TID prn for muscle tightness  2/12- asking PT to do Estim in B/L shoulders due to pain/quesitonable subluxation which could be adding to pain 4. Mood: Provide emotional support             -antipsychotic agents: N/A 5. Neuropsych: This patient is capable of making decisions on her own behalf. 6. Skin/Wound Care: Routine skin checks  2/8: facial abraisons healing well.  7. Fluids/Electrolytes/Nutrition: Routine in  and outs.    2/1- Hypokalemia- 3.1- will replete and recheck Wednesday  2/3- will recheck in AM  2/4- K+ 3.3- will replete 40 mEq x2 and recheck this weekend  2/8: K+ 3.0; ordered 83meq x2 today with repeat BMP tomorrow   morning.   2/9- K+ still 3.0- will check Mg level; start Magnesium Oxide 400 mg BID as well as K+ replete x3 40 mEq and recheck in AM  2/10- Mg 1.7- a little low, but already started PO repletion yesterday; K+ up to 3.7- finally got KCL to respond- will recheck on Friday to make sure staying up, since it kept staying llow 8.  Acute on chronic diastolic congestive heart failure.  Echocardiogram ejection fraction 50%.  Monitor for any signs of fluid overload.  Continue Demadex 20 mg twice daily  Daily weights.   Filed Weights   02/23/19 0415 02/23/19 1444 02/24/19 0424  Weight: (!) 159.7 kg (!) 159.7 kg (!) 157.9 kg    2 weight stable within 0.2 kg over the last 2 days 9.  Diabetes mellitus with peripheral neuropathy.  Hemoglobin A1c 7.1.  NovoLog 15 units 3 times daily with meals, Lantus insulin 25 units twice daily.  Check blood sugars before meals and at bedtime.  Diabetic teaching   CBG (last 3)  Recent Labs    02/23/19 2347 02/24/19 0420 02/24/19 0734  GLUCAP 140* 168* 265*  Elevated this morning but generally has been running in acceptable range  2/3- place consult for DM coordinator since used to be on insulin pump at home- will see how they can help- I appreciate the assistance  2/4- increased Lantus to 35 units BID and Novolog to 16 units TID with meals. Family to bring in insulin pump Friday? If so, will call DM coordinator to help.  2/5- will get DM coordinator to help if going to use insulin pump.   2/6- DM coordinator increased Lantus to 38 units BID, checking BGs more frequently and Novolog 0-15 units  2/7- increased Lantus to 42 units BID as of this evening  2/8: remain elevated but with improved control from yesterday.   2/9- back in insulin  pump- will monitor  2/10- Called DM coordinator about BG basal starting at 8am- to move it back to her new breakfast time.  2/11- increasing BGs via DM coordinator by ~ 10%- via basal rate- BGs still out of control  2/12- Pt wants ot start back Ozempic which is home med- brought in yesterday from home since hospital doesn't stock- will ask DM coordinator to start it.              Monitor with increased mobility 10.  Cardiogenic shock.  Wean from pressors.  Monitor for orthostasis.  Continue Coreg 12.5 mg twice daily  2/4- will reduce to 6.25 mg BID since BP soft last night/this AM at 90s/50s 11.  Super obesity.  BMI 54.50.  Dietary follow-up.  Encourage weight loss 12.  Hyperlipidemia.  Crestor 13.  History of anemia.  Continue iron supplement.  Latest hemoglobin 11.6.             2/1- Hb 13.7 14.  Sickle cell trait.  Follow-up outpatient. 15. Constipation Last BM on 2/11, will increase Senokot to 2 tablets twice daily on 2/13 16. L ear popping  2/7- will order Debrox drops for wax and see if helps. For L ear  2/10- improving finally per pt- will monitor  2/11- will clean out/irrigate L ear if needed 17. Waist pressure ulcer  2/11- 5-6cm x <1 cm- superficial- will con't wound care and monitor- is granulating.   2/12- looking slightly better.     LOS: 15 days A FACE TO [place] E Zechariah Bissonnette 02/24/2019, 10:55 AM

## 2019-02-24 NOTE — Progress Notes (Signed)
Occupational Therapy Session Note  Patient Details  Name: Kaitlin [name] MRN: [id number] Date of Birth: [birth year]  Today's Date: 02/24/2019 OT Individual Time: 1400-1500      Short Term Goals: Week 2:  OT Short Term Goal 1 (Week 2): patient will complete rolling in bed with min A of one OT Short Term Goal 2 (Week 2): patient will complete sit to/from supine with mod A of one OT Short Term Goal 3 (Week 2): patient will complete UB bathing and dressing with mod A  Skilled Therapeutic Interventions/Progress Updates:    OT session focused on bed mobility, functional transfers, and ADL independence. Pt received supine in bed agreeable to therapy. Completed supine>sit with mod A+2 then stand pivot transfer bed<>BSC with use of RW and min A +2. Pt sustained standing with RW for toileting hygiene for ~30 seconds with min A. While sitting EOB, OT educated and provided demonstration with sock-aid, reacher, and [place] to promote independence with dressing. Pt doffed socks with increased time and min cues for technique with reacher. Pt donned socks with min A using sock-aid, partially due to friction of socks and TED hose. Practiced use of shoe horn, however max difficulty noted. Pt able to don shoes with min assist without shoe horn. At end of session, pt transitioned sit>supine with mod A and left with all needs in reach.  Saebo stim applied L deltoid at beginning of session for 60 minutes. Skin intact at end of stimulation. At end of session, Saebo applied to R deltoid for 30 minutes.  Rowe Robert Saebo Stim One 330 pulse width 35 Hz pulse rate On 8 sec/ off 8 sec Ramp up/ down 2 sec Symmetrical Biphasic wave form  Max intensity 173mA at 500 Ohm load   Therapy Documentation Precautions:  Precautions Precautions: Fall Precaution Comments: morbid obesity, monitor SpO2 and HR Restrictions Weight Bearing Restrictions: No General:   Vital Signs:  Pain:   ADL: ADL Eating: Minimal assistance Where  Assessed-Eating: Bed level Grooming: Minimal assistance Where Assessed-Grooming: Bed level Upper Body Bathing: Maximal assistance Where Assessed-Upper Body Bathing: Bed level Lower Body Bathing: Dependent Where Assessed-Lower Body Bathing: Bed level Upper Body Dressing: Maximal assistance Where Assessed-Upper Body Dressing: Bed level Lower Body Dressing: Dependent Where Assessed-Lower Body Dressing: Bed level Toileting: Dependent Where Assessed-Toileting: Bed level Toilet Transfer: Other (comment)(maximove bed to w/c today, unsafe at this time to transfer to commode) ADL Comments: significant fatigue with adl tasks Vision   Perception    Praxis   Exercises:   Other Treatments:     Therapy/Group: Individual Therapy  Duayne Cal 02/24/2019, 3:20 PM

## 2019-02-25 LAB — GLUCOSE, CAPILLARY
Glucose-Capillary: 119 mg/dL — ABNORMAL HIGH (ref 70–99)
Glucose-Capillary: 147 mg/dL — ABNORMAL HIGH (ref 70–99)
Glucose-Capillary: 149 mg/dL — ABNORMAL HIGH (ref 70–99)
Glucose-Capillary: 154 mg/dL — ABNORMAL HIGH (ref 70–99)
Glucose-Capillary: 170 mg/dL — ABNORMAL HIGH (ref 70–99)
Glucose-Capillary: 179 mg/dL — ABNORMAL HIGH (ref 70–99)
Glucose-Capillary: 233 mg/dL — ABNORMAL HIGH (ref 70–99)

## 2019-02-25 NOTE — Progress Notes (Signed)
[place] PHYSICAL MEDICINE & REHABILITATION PROGRESS NOTE   Subjective/Complaints:   Patient feels well this morning.  She feels like her bowels and bladder are doing okay.  No significant pain complaints.  Appetite was okay this morning  ROS- pt denies SOB, CP, abd pain; N/V/C/D/ vision changes, Headache. Objective:   No results found. No results for input(s): WBC, HGB, HCT, PLT in the last 72 hours. No results for input(s): NA, K, CL, CO2, GLUCOSE, BUN, CREATININE, CALCIUM in the last 72 hours.  Intake/Output Summary (Last 24 hours) at 02/25/2019 0951 Last data filed at 02/24/2019 1405 Gross per 24 hour  Intake 300 ml  Output 451 ml  Net -151 ml     Physical Exam: Vital Signs Blood pressure 122/65, pulse 98, temperature 98.7 F (37.1 C), resp. rate 19, height 5\' 9"  (1.753 m), weight (!) 157.9 kg, last menstrual period 06/17/2012, SpO2 96 %. Vitals reviewed. Constitutional: . n bariatric bed; sitting/standing right on EOB with PT and PTA in room; pt about to walk, brighter affect, NAD HENT:  Head: Normocephalic.  Facial abrasisions esp on nose- healing but scarring lighter color than expected - skin dry on face Eyes: EOM are normal. Neck: No tracheal deviation present.  CV: RRR- no M/R/G Respiratory: decreased breath sounds at bases-otherwise good air movement ; no W/R/R GI: Soft. NT, ND, protuberant, (+)BS again-  Musculoskeletal:     Comments: LE edema Very tight pecs B/L but also TTP over R deltoid and posterior R shoulder  Neurological: She is alert.   RUE: shoulder abduction 3+/5, elbow flexion/extension 3-/5, hand grip 4-/5.  LUE: shoulder abduction 2/5, elbow flexion/extension 2+/5, hand grip 3/5 B/l LE: HF, KE 3+/5, ADF 4-/5 Sensation intact to light touch  Skin: Skin is warm and dry.   Psychiatric: brighter affect    Assessment/Plan: 1. Functional deficits secondary to critical illness myopathy which require 3+ hours per day of interdisciplinary therapy  in a comprehensive inpatient rehab setting.  Physiatrist is providing close team supervision and 24 hour management of active medical problems listed below.  Physiatrist and rehab team continue to assess barriers to discharge/monitor patient progress toward functional and medical goals  Care Tool:  Bathing    Body parts bathed by patient: Abdomen, Face, Chest, Front perineal area   Body parts bathed by helper: Right arm, Left arm, Chest, Abdomen, Front perineal area, Buttocks, Right upper leg, Left upper leg, Right lower leg, Left lower leg     Bathing assist Assist Level: Maximal Assistance - Patient 24 - 49%     Upper Body Dressing/Undressing Upper body dressing   What is the patient wearing?: Hospital gown only    Upper body assist Assist Level: Maximal Assistance - Patient 25 - 49%    Lower Body Dressing/Undressing Lower body dressing      What is the patient wearing?: Incontinence brief     Lower body assist Assist for lower body dressing: 2 Helpers     Toileting Toileting    Toileting assist Assist for toileting: 2 Helpers     Transfers Chair/bed transfer  Transfers assist  Chair/bed transfer activity did not occur: Safety/medical concerns  Chair/bed transfer assist level: 2 Helpers     Locomotion Ambulation   Ambulation assist   Ambulation activity did not occur: Safety/medical concerns  Assist level: 2 helpers Assistive device: Walker-rolling Max distance: 4   Walk 10 feet activity   Assist  Walk 10 feet activity did not occur: Safety/medical concerns  Assist level:  2 helpers Assistive device: Walker-rolling   Walk 50 feet activity   Assist Walk 50 feet with 2 turns activity did not occur: Safety/medical concerns         Walk 150 feet activity   Assist Walk 150 feet activity did not occur: Safety/medical concerns         Walk 10 feet on uneven surface  activity   Assist Walk 10 feet on uneven surfaces activity did not  occur: Safety/medical concerns         Wheelchair     Assist Will patient use wheelchair at discharge?: Yes(TBD) Type of Wheelchair: Manual(Per chart review LT goals set for dischrage )           Wheelchair 50 feet with 2 turns activity    Assist    Wheelchair 50 feet with 2 turns activity did not occur: Safety/medical concerns       Wheelchair 150 feet activity     Assist  Wheelchair 150 feet activity did not occur: Safety/medical concerns       Blood pressure 122/65, pulse 98, temperature 98.7 F (37.1 C), resp. rate 19, height 5\' 9"  (1.753 m), weight (!) 157.9 kg, last menstrual period 06/17/2012, SpO2 96 %.   Medical Problem List and Plan: 1.  Decreased functional mobility secondary to CIM due to acute hypoxemic respiratory failure due to ARDS/COVID-19 pneumonia.  Extubated 01/31/2019             -patient may shower             -ELOS/Goals: 27-32 days/Mod A            Continue CIR PT, OT  -satting well on room air, changed O2 to PRN.  Currently not using Right upper extremity strength has improved left upper extremity strength is still quite weak 2.  Antithrombotics: -DVT/anticoagulation: Lovenox.  Venous Doppler studies negative             -antiplatelet therapy: Aspirin 81 mg daily 3. Pain Management: Tylenol as needed no pain complaints 2/13  2/5- added voltaren gel for L shoulder QID  2/8: added lidocaine patch for right shoulder.  2/11 - needs some muscle relaxants for L muscular chest pain- will try flexeril 5mg  TID prn for muscle tightness  2/12- asking PT to do Estim in B/L shoulders due to pain/quesitonable subluxation which could be adding to pain 4. Mood: Provide emotional support             -antipsychotic agents: N/A 5. Neuropsych: This patient is capable of making decisions on her own behalf. 6. Skin/Wound Care: Routine skin checks  2/8: facial abraisons healing well.  7. Fluids/Electrolytes/Nutrition: Routine in and outs.    2/1-  Hypokalemia- 3.1- will replete and recheck Wednesday  2/3- will recheck in AM  2/4- K+ 3.3- will replete 40 mEq x2 and recheck this weekend  2/8: K+ 3.0; ordered 89meq x2 today with repeat BMP tomorrow   morning.   2/9- K+ still 3.0- will check Mg level; start Magnesium Oxide 400 mg BID as well as K+ replete x3 40 mEq and recheck in AM  2/10- Mg 1.7- a little low, but already started PO repletion yesterday; K+ up to 3.7- finally got KCL to respond- will recheck on Friday to make sure staying up, since it kept staying llow 8.  Acute on chronic diastolic congestive heart failure.  Echocardiogram ejection fraction 50%.  Monitor for any signs of fluid overload.  Continue Demadex 20 mg twice daily  Daily weights.   Filed Weights   02/23/19 0415 02/23/19 1444 02/24/19 0424  Weight: (!) 159.7 kg (!) 159.7 kg (!) 157.9 kg    2 weight stable within 0.2 kg over the last 2 days 9.  Diabetes mellitus with peripheral neuropathy.  Hemoglobin A1c 7.1.  NovoLog 15 units 3 times daily with meals, Lantus insulin 25 units twice daily.  Check blood sugars before meals and at bedtime.  Diabetic teaching   CBG (last 3)  Recent Labs    02/25/19 0027 02/25/19 0414 02/25/19 0737  GLUCAP 147* 154* 233*  Some lability, 07 37 value is postprandial likely  2/3- place consult for DM coordinator since used to be on insulin pump at home- will see how they can help- I appreciate the assistance  2/4- increased Lantus to 35 units BID and Novolog to 16 units TID with meals. Family to bring in insulin pump Friday? If so, will call DM coordinator to help.  2/5- will get DM coordinator to help if going to use insulin pump.   2/6- DM coordinator increased Lantus to 38 units BID, checking BGs more frequently and Novolog 0-15 units  2/7- increased Lantus to 42 units BID as of this evening  2/8: remain elevated but with improved control from yesterday.   2/9- back in insulin pump- will monitor  2/10- Called DM  coordinator about BG basal starting at 8am- to move it back to her new breakfast time.  2/11- increasing BGs via DM coordinator by ~ 10%- via basal rate- BGs still out of control  2/12- Pt wants ot start back Ozempic which is home med- brought in yesterday from home since hospital doesn't stock- will ask DM coordinator to start it.              Monitor with increased mobility 10.  Cardiogenic shock.  Wean from pressors.  Monitor for orthostasis.  Continue Coreg 12.5 mg twice daily  2/4- will reduce to 6.25 mg BID since BP soft last night/this AM at 90s/50s 11.  Super obesity.  BMI 54.50.  Dietary follow-up.  Encourage weight loss 12.  Hyperlipidemia.  Crestor 13.  History of anemia.  Continue iron supplement.  Latest hemoglobin 11.6.             2/1- Hb 13.7 14.  Sickle cell trait.  Follow-up outpatient. 15. Constipation Last BM on 2/11, will increase Senokot to 2 tablets twice daily on 2/13, continent stool this morning and yesterday evening 16. L ear popping  2/7- will order Debrox drops for wax and see if helps. For L ear  2/10- improving finally per pt- will monitor  2/11- will clean out/irrigate L ear if needed 17. Waist pressure ulcer  2/11- 5-6cm x <1 cm- superficial- will con't wound care and monitor- is granulating.   2/12- looking slightly better.     LOS: 16 days A FACE TO FACE EVALUATION WAS PERFORMED  Charlett Blake 02/25/2019, 9:51 AM

## 2019-02-26 ENCOUNTER — Inpatient Hospital Stay (HOSPITAL_COMMUNITY): Payer: Medicaid Other | Admitting: Occupational Therapy

## 2019-02-26 ENCOUNTER — Inpatient Hospital Stay (HOSPITAL_COMMUNITY): Payer: Medicaid Other | Admitting: Physical Therapy

## 2019-02-26 LAB — CBC WITH DIFFERENTIAL/PLATELET
Abs Immature Granulocytes: 0.01 10*3/uL (ref 0.00–0.07)
Basophils Absolute: 0 10*3/uL (ref 0.0–0.1)
Basophils Relative: 0 %
Eosinophils Absolute: 0 10*3/uL (ref 0.0–0.5)
Eosinophils Relative: 0 %
HCT: 40 % (ref 36.0–46.0)
Hemoglobin: 13.5 g/dL (ref 12.0–15.0)
Immature Granulocytes: 0 %
Lymphocytes Relative: 46 %
Lymphs Abs: 3.3 10*3/uL (ref 0.7–4.0)
MCH: 27 pg (ref 26.0–34.0)
MCHC: 33.8 g/dL (ref 30.0–36.0)
MCV: 80 fL (ref 80.0–100.0)
Monocytes Absolute: 0.6 10*3/uL (ref 0.1–1.0)
Monocytes Relative: 8 %
Neutro Abs: 3.4 10*3/uL (ref 1.7–7.7)
Neutrophils Relative %: 46 %
Platelets: 235 10*3/uL (ref 150–400)
RBC: 5 MIL/uL (ref 3.87–5.11)
RDW: 16.8 % — ABNORMAL HIGH (ref 11.5–15.5)
WBC: 7.3 10*3/uL (ref 4.0–10.5)
nRBC: 0 % (ref 0.0–0.2)

## 2019-02-26 LAB — GLUCOSE, CAPILLARY
Glucose-Capillary: 129 mg/dL — ABNORMAL HIGH (ref 70–99)
Glucose-Capillary: 165 mg/dL — ABNORMAL HIGH (ref 70–99)
Glucose-Capillary: 190 mg/dL — ABNORMAL HIGH (ref 70–99)
Glucose-Capillary: 116 mg/dL — ABNORMAL HIGH (ref 70–99)
Glucose-Capillary: 123 mg/dL — ABNORMAL HIGH (ref 70–99)

## 2019-02-26 LAB — COMPREHENSIVE METABOLIC PANEL
ALT: 29 U/L (ref 0–44)
AST: 25 U/L (ref 15–41)
Albumin: 3.1 g/dL — ABNORMAL LOW (ref 3.5–5.0)
Alkaline Phosphatase: 61 U/L (ref 38–126)
Anion gap: 14 (ref 5–15)
BUN: 12 mg/dL (ref 6–20)
CO2: 32 mmol/L (ref 22–32)
Calcium: 9.4 mg/dL (ref 8.9–10.3)
Chloride: 94 mmol/L — ABNORMAL LOW (ref 98–111)
Creatinine, Ser: 0.84 mg/dL (ref 0.44–1.00)
GFR calc Af Amer: >60 mL/min (ref >60)
GFR calc non Af Amer: >60 mL/min (ref >60)
Glucose, Bld: 227 mg/dL — ABNORMAL HIGH (ref 70–99)
Potassium: 2.7 mmol/L — CL (ref 3.5–5.1)
Sodium: 140 mmol/L (ref 135–145)
Total Bilirubin: 0.6 mg/dL (ref 0.3–1.2)
Total Protein: 6.2 g/dL — ABNORMAL LOW (ref 6.5–8.1)

## 2019-02-26 MED ORDER — POTASSIUM CHLORIDE CRYS ER 20 MEQ PO TBCR
20.0000 meq | EXTENDED_RELEASE_TABLET | Freq: Three times a day (TID) | ORAL | Status: DC
  Administered 2019-02-26 (×2): 20 meq via ORAL
  Filled 2019-02-26 (×2): qty 1

## 2019-02-26 NOTE — Plan of Care (Signed)
  Problem: Consults Goal: RH GENERAL PATIENT EDUCATION Description: See Patient Education module for education specifics. 02/26/2019 1143 by Amanda Cockayne, LPN Outcome: Progressing  Goal: Skin Care Protocol Initiated - if Braden Score 18 or less Description: If consults are not indicated, leave blank or document N/A 02/26/2019 1143 by Amanda Cockayne, LPN Outcome: Progressing    Problem: RH BLADDER ELIMINATION Goal: RH STG MANAGE BLADDER WITH ASSISTANCE Description: STG Manage Bladder With mod Assistance 02/26/2019 1143 by Amanda Cockayne, LPN Outcome: Progressing    Problem: RH SKIN INTEGRITY Goal: RH STG SKIN FREE OF INFECTION/BREAKDOWN Description: Pt will be free of skin breakdown or pressure ulcers prior to DC with mod assist 02/26/2019 1143 by Amanda Cockayne, LPN Outcome: Progressing  Goal: RH STG MAINTAIN SKIN INTEGRITY WITH ASSISTANCE Description: STG Maintain Skin Integrity With mod Assistance. 02/26/2019 1143 by Amanda Cockayne, LPN Outcome: Progressing  Goal: RH STG ABLE TO PERFORM INCISION/WOUND CARE W/ASSISTANCE Description: STG Able To Perform Incision/Wound Care With mod Assistance. 02/26/2019 1143 by Amanda Cockayne, LPN Outcome: Progressing  Problem: RH SAFETY Goal: RH STG ADHERE TO SAFETY PRECAUTIONS W/ASSISTANCE/DEVICE Description: STG Adhere to Safety Precautions With mod Assistance/Device. 02/26/2019 1143 by Amanda Cockayne, LPN Outcome: Progressing

## 2019-02-26 NOTE — Progress Notes (Signed)
Physical Therapy Weekly Progress Note  Patient Details  Name: Kaitlin [name] MRN: [id number] Date of Birth: 03/11/1965  Beginning of progress report period: February 17, 2019 End of progress report period: February 26, 2019  Today's Date: 02/26/2019 PT Individual Time: 1100-1200; 1345-1500 PT Individual Time Calculation (min): 60 min and 75 min  Patient has met 3 of 3 short term goals.  Patient has made great progress over the past week and remains motivated and works hard towards achieving LTG. Pt requires mod to max A for rolling, is mod A for supine to sit and min A for sit to supine, requires assist x 2 to stand to RW (CGA to min A +2) and is able to perform stand pivot transfers with the RW with min A. Pt is able to ambulate up to 60 ft with RW with min A and assist from a 2nd person with a w/c follow for safety.  Patient continues to demonstrate the following deficits muscle weakness, decreased cardiorespiratoy endurance and decreased sitting balance, decreased standing balance, decreased postural control and decreased balance strategies and therefore will continue to benefit from skilled PT intervention to increase functional independence with mobility.  Patient progressing toward long term goals..  Continue plan of care.  PT Short Term Goals Week 2:  PT Short Term Goal 1 (Week 2): Pt will complete bed mobility with assist x 1 PT Short Term Goal 1 - Progress (Week 2): Met PT Short Term Goal 2 (Week 2): Pt will complete stand pivot transfer with RW and assist x 2 PT Short Term Goal 2 - Progress (Week 2): Met PT Short Term Goal 3 (Week 2): Pt will ambulate x 5 ft with LRAD and assist x 2 PT Short Term Goal 3 - Progress (Week 2): Met Week 3:  PT Short Term Goal 1 (Week 3): Pt will ambulate x 100 ft with assist x 1 PT Short Term Goal 2 (Week 3): Pt will perform sit to stand transfer with assist x 1 consistently PT Short Term Goal 3 (Week 3): Pt will tolerate standing x 5 min while  performing functional task  Skilled Therapeutic Interventions/Progress Updates:    Session 1: Pt received seated in bed, agreeable to PT session. No complaints of pain at rest. Rolling L/R with mod A for donning pants with max A. Supine to sit with mod A with HOB slightly elevated and use of bedrail. Dependent to don shoes while seated EOB. Sit to stand with CGA to min A +2 to RW throughout session. Stand pivot transfer to w/c with RW and min A. Dependent transport via w/c to therapy gym for time conservation. Ambulation x 30', x 37', x 54', x 60' with RW and min A with close w/c follow for safety. Pt exhibits significantly improved endurance for gait training this date. Pt ambulates with wide BOS and decreased LE control during gait, reports her feet feel "heavy". Pt performs oral hygiene at sink while seated in w/c with cues for anterior leaning to reach faucet valves and toothbrush. Stand pivot transfer back to bed with min A. Sit to/from supine x 2 reps with CGA for sit to supine and mod A for supine to sit from [facility] slightly elevated. Pt reports onset of burning pain in B middle deltoids when attempting to pull herself into sitting position that decreases at rest. Pt left semi-reclined in bed with needs in reach at end of session.  Session 2: Pt received seated in bed, agreeable to PT session.  No complaints of pain this PM, does report increase in fatigue. Supine to sit with mod A for trunk control. Sit to stand with min A +2 to RW. Stand pivot transfer bed to w/c with RW and min A. Sit to stand with min A +2 from w/c to RW. Ambulation x 50 ft with RW and min A with close w/c follow for safety. Sit to stand 2 x 5 reps from 22" then 21" high mat table to RW with CGA +2 progressing to min A +2 with onset of fatigue and decrease in mat table height. Use of patient's choice of gospel music during session for improved patient mood and for encouragement. Pt becomes emotional and tearful during session, provided  emotional support. Also provided handout for home measurements to be completed by patient's family. Pt with questions with regards to equipment she will be using at home, discussed RW, w/c, possibility of hospital bed use, and bathroom equipment that she will further discuss with OT. Pt appears overwhelmed at times when discussing upcoming d/c, provided encouragement as pt continues to make great gains during therapy sessions. Pt reports urge to urinate. Attempt to stand to RW in order to transfer to [facility], pt unable to stand due to fatigue. Sit to supine min A. Rolling L/R with max A for bedpan placement, pt able to continently void. Pt is dependent for pericare and brief change. Pt left semi-reclined in bed with needs in reach at end of session.   Therapy Documentation Precautions:  Precautions Precautions: Fall Precaution Comments: morbid obesity, monitor SpO2 and HR Restrictions Weight Bearing Restrictions: No   Therapy/Group: Individual Therapy   Excell Seltzer, PT, DPT  02/26/2019, 12:59 PM

## 2019-02-26 NOTE — Progress Notes (Signed)
Occupational Therapy Session Note  Patient Details  Name: Kaitlin [name] MRN: [id number] Date of Birth: Aug 05, 1965  Today's Date: 02/26/2019 OT Individual Time: 0845-1000 OT Individual Time Calculation (min): 75 min   Short Term Goals: Week 2:  OT Short Term Goal 1 (Week 2): patient will complete rolling in bed with min A of one OT Short Term Goal 2 (Week 2): patient will complete sit to/from supine with mod A of one OT Short Term Goal 3 (Week 2): patient will complete UB bathing and dressing with mod A  Skilled Therapeutic Interventions/Progress Updates:    Pt greeted in bed and premedicated for pain. She was agreeable to shower this AM. Max A for supine<sit and Mod A for power up, Min A of 1 for stand pivot<w/c. When w/c was parked at the bathroom doorway, pt ambulated with RW to the bariatric 3:1 placed in shower, Mod A for power up from RT and OT stabilizing walker before functional mobility in bathroom. While sitting on the [facility], pt had continent B+B void while OT held bucket beneath her. Perihygiene completed via cutout in [facility] and also with pt standing using RW for support. After her insulin pump was covered, RT held device to ensure dryness while OT assisted pt with bathing tasks. Per RN, ok to wash wound on back uncovered. Pt needed Mod A overall, active assist to elevate both UEs to wash underarms. Total A for peri-areas. She did use the LH sponge to wash her feet. Increased time required afterwards for thorough drying with pt able to direct her care well. She was then motivated to ambulate back to the bed. Mod A of 1 for power up using RW and then she ambulated back to bed with steady assist of 1. 2nd person present for safety, and pt seemed to be comforted by this. Pt returned to bed with +2 assist and she boosted herself up using the headboard. Pt rolled Rt>Lt with Mod A while 2nd person donned brief, changed inter-dry, and adjusted chuck pad. Placed a foam dressing over back wound per RN  instruction. Left pt comfortably in bed, very proud of her accomplishments today! All needs within reach.      Therapy Documentation Precautions:  Precautions Precautions: Fall Precaution Comments: morbid obesity, monitor SpO2 and HR Restrictions Weight Bearing Restrictions: No Pain: Pain Assessment Pain Scale: 0-10 Pain Score: 0-No pain Pain Type: Acute pain Pain Location: Arm Pain Orientation: Right;Left Pain Descriptors / Indicators: Aching Pain Onset: Awakened from sleep Patients Stated Pain Goal: 2 Pain Intervention(s): Medication (See eMAR) ADL: ADL Eating: Minimal assistance Where Assessed-Eating: Bed level Grooming: Minimal assistance Where Assessed-Grooming: Bed level Upper Body Bathing: Maximal assistance Where Assessed-Upper Body Bathing: Bed level Lower Body Bathing: Dependent Where Assessed-Lower Body Bathing: Bed level Upper Body Dressing: Maximal assistance Where Assessed-Upper Body Dressing: Bed level Lower Body Dressing: Dependent Where Assessed-Lower Body Dressing: Bed level Toileting: Dependent Where Assessed-Toileting: Bed level Toilet Transfer: Other (comment)(maximove bed to w/c today, unsafe at this time to transfer to commode) ADL Comments: significant fatigue with adl tasks      Therapy/Group: Individual Therapy  Bonetta Mostek A Eris Breck 02/26/2019, 12:37 PM

## 2019-02-26 NOTE — Plan of Care (Signed)
  Problem: Consults Goal: RH GENERAL PATIENT EDUCATION Description: See Patient Education module for education specifics. Outcome: Progressing Goal: Skin Care Protocol Initiated - if Braden Score 18 or less Description: If consults are not indicated, leave blank or document N/A Outcome: Progressing   Problem: RH BLADDER ELIMINATION Goal: RH STG MANAGE BLADDER WITH ASSISTANCE Description: STG Manage Bladder With mod Assistance Outcome: Progressing   Problem: RH SKIN INTEGRITY Goal: RH STG SKIN FREE OF INFECTION/BREAKDOWN Description: Pt will be free of skin breakdown or pressure ulcers prior to DC with mod assist Outcome: Progressing Goal: RH STG MAINTAIN SKIN INTEGRITY WITH ASSISTANCE Description: STG Maintain Skin Integrity With mod Assistance. Outcome: Progressing Goal: RH STG ABLE TO PERFORM INCISION/WOUND CARE W/ASSISTANCE Description: STG Able To Perform Incision/Wound Care With mod Assistance. Outcome: Progressing   Problem: RH SAFETY Goal: RH STG ADHERE TO SAFETY PRECAUTIONS W/ASSISTANCE/DEVICE Description: STG Adhere to Safety Precautions With mod Assistance/Device. Outcome: Progressing

## 2019-02-26 NOTE — Progress Notes (Addendum)
CRITICAL VALUE ALERT  Critical Value:  K+ 2.7  Date & Time Notied:  9:45 AM  Provider Notified: 9:49 AM via Face-to-Face  Orders Received/Actions taken: Dan, PA, will put orders in.  Amanda Cockayne, LPN

## 2019-02-27 ENCOUNTER — Inpatient Hospital Stay (HOSPITAL_COMMUNITY): Payer: Medicaid Other | Admitting: Physical Therapy

## 2019-02-27 ENCOUNTER — Inpatient Hospital Stay (HOSPITAL_COMMUNITY): Payer: Medicaid Other

## 2019-02-27 ENCOUNTER — Inpatient Hospital Stay (HOSPITAL_COMMUNITY): Payer: Medicaid Other | Admitting: *Deleted

## 2019-02-27 LAB — GLUCOSE, CAPILLARY
Glucose-Capillary: 127 mg/dL — ABNORMAL HIGH (ref 70–99)
Glucose-Capillary: 154 mg/dL — ABNORMAL HIGH (ref 70–99)
Glucose-Capillary: 205 mg/dL — ABNORMAL HIGH (ref 70–99)
Glucose-Capillary: 219 mg/dL — ABNORMAL HIGH (ref 70–99)
Glucose-Capillary: 252 mg/dL — ABNORMAL HIGH (ref 70–99)
Glucose-Capillary: 307 mg/dL — ABNORMAL HIGH (ref 70–99)

## 2019-02-27 LAB — BASIC METABOLIC PANEL
Anion gap: 14 (ref 5–15)
BUN: 12 mg/dL (ref 6–20)
CO2: 30 mmol/L (ref 22–32)
Calcium: 9.4 mg/dL (ref 8.9–10.3)
Chloride: 94 mmol/L — ABNORMAL LOW (ref 98–111)
Creatinine, Ser: 0.84 mg/dL (ref 0.44–1.00)
GFR calc Af Amer: >60 mL/min (ref >60)
GFR calc non Af Amer: >60 mL/min (ref >60)
Glucose, Bld: 176 mg/dL — ABNORMAL HIGH (ref 70–99)
Potassium: 2.6 mmol/L — CL (ref 3.5–5.1)
Sodium: 138 mmol/L (ref 135–145)

## 2019-02-27 MED ORDER — POTASSIUM CHLORIDE CRYS ER 20 MEQ PO TBCR
40.0000 meq | EXTENDED_RELEASE_TABLET | Freq: Three times a day (TID) | ORAL | Status: AC
  Administered 2019-02-27 (×3): 40 meq via ORAL
  Filled 2019-02-27 (×3): qty 2

## 2019-02-27 NOTE — Progress Notes (Signed)
Physical Therapy Session Note  Patient Details  Name: Kaitlin [name] MRN: [id number] Date of Birth: [birth year]  Today's Date: 02/27/2019 PT Individual Time: 0800-0915 PT Individual Time Calculation (min): 75 min   Short Term Goals: Week 3:  PT Short Term Goal 1 (Week 3): Pt will ambulate x 100 ft with assist x 1 PT Short Term Goal 2 (Week 3): Pt will perform sit to stand transfer with assist x 1 consistently PT Short Term Goal 3 (Week 3): Pt will tolerate standing x 5 min while performing functional task  Skilled Therapeutic Interventions/Progress Updates:    Pt received seated in bed, agreeable to PT session. No complaints of pain. Pt is mod A for UB bathing at bed level, dependent for LB bathing and donning of brief, pants, and TED hose. Pt is mod A to don a new gown at bed level. Supine to sit with mod A for trunk control with HOB elevated and use of bedrail. Pt is dependent to don shoes while seated EOB. Sit to stand with mod A +2 to stand from EOB due to fatigue this AM. Ambulation x 131', x 125' with RW and min A. Pt exhibits improved tolerance for gait training this date as well as improved overall endurance. Stand pivot transfer w/c to recliner with RW and min A. Pt unable to stand again from recliner due to low seat height. Pt is setup A for oral hygiene while seated in recliner at sink. Pt left seated in recliner in room with needs in reach at end of session.  Therapy Documentation Precautions:  Precautions Precautions: Fall Precaution Comments: morbid obesity, monitor SpO2 and HR Restrictions Weight Bearing Restrictions: No    Therapy/Group: Individual Therapy   Excell Seltzer, PT, DPT  02/27/2019, 12:04 PM

## 2019-02-27 NOTE — Progress Notes (Signed)
Physical Therapy Session Note  Patient Details  Name: Kaitlin [name] MRN: [id number] Date of Birth: 05-Sep-1965  Today's Date: 02/27/2019 PT Individual Time: [id number] PT Individual Time Calculation (min): 58 min   Short Term Goals: Week 3:  PT Short Term Goal 1 (Week 3): Pt will ambulate x 100 ft with assist x 1 PT Short Term Goal 2 (Week 3): Pt will perform sit to stand transfer with assist x 1 consistently PT Short Term Goal 3 (Week 3): Pt will tolerate standing x 5 min while performing functional task  Skilled Therapeutic Interventions/Progress Updates:   Pt with high blood sugar level (307 per NT) and reporting feeling unwell at this time. Had already received insulin. Pt up in chair and uncomfortable, so request to return back to bed. Performed mod assist sit > stand with second person in front just for safety and per patient preference. Requires 2 attempts and cues for anterior weightshift. Min assist for actual transfer once in standing with RW with +2 for safety for patient encouragement. Returned to supine with min assist for LLE management. Performed rolling with min assist using bed rail to remove pants. Agreeable to bed level therex for UE strengthening and improving ROM to increase functional mobility. Performed serratus punches in modified range with 3# weight on R and no weight on L with active assisted motion x 10 reps each, bicep curls with 4# straight weight and pronated grip bicep curl with 4# straight weight. Pt tearful at times due to reported feeling like she couldn't do as much. Emotional support provided and encouraged her due to overall functional progress. Supine glute sets x 10 reps with cues for breathing technique. Pt then reports need to use bathroom and request bed pan. Rolling with min assist for placement and total assist for hygiene. Pt self directed repositioning in the bed and positioning of pillows with all needs in reach.   Therapy Documentation Precautions:   Precautions Precautions: Fall Precaution Comments: morbid obesity, monitor SpO2 and HR Restrictions Weight Bearing Restrictions: No Pain:  Denies pain. C/o weakness and not feeling well due to elevated blood sugar level.   Therapy/Group: Individual Therapy  Canary Brim Ivory Broad, PT, DPT, CBIS  02/27/2019, 3:15 PM

## 2019-02-27 NOTE — Progress Notes (Signed)
[place] PHYSICAL MEDICINE & REHABILITATION PROGRESS NOTE   Subjective/Complaints:   Pt reports that she would like to have a chair at home to sit in- not a w/c, but a recliner that doesn't "sit low". Explained not possible with any insurance, unfortunately.   However pt walked 60 ft min assist with RW yesterday AND took shower min assist.  Also, K+ 2.6- will replete.    ROS- pt denies SOB, CP, abd pain; N/V/C/D/ vision changes, (-)Headache. Objective:   No results found. Recent Labs    02/26/19 0819  WBC 7.3  HGB 13.5  HCT 40.0  PLT 235   Recent Labs    02/26/19 0819 02/27/19 0555  NA 140 138  K 2.7* 2.6*  CL 94* 94*  CO2 32 30  GLUCOSE 227* 176*  BUN 12 12  CREATININE 0.84 0.84  CALCIUM 9.4 9.4    Intake/Output Summary (Last 24 hours) at 02/27/2019 0830 Last data filed at 02/26/2019 1845 Gross per 24 hour  Intake 600 ml  Output 1025 ml  Net -425 ml     Physical Exam: Vital Signs Labs and vitals reviewed  Blood pressure 108/66, pulse (!) 102, temperature 98.5 F (36.9 C), temperature source Oral, resp. rate 18, height 5\' 9"  (1.753 m), weight (!) 155.1 kg, last menstrual period 06/17/2012, SpO2 96 %. Vitals reviewed. Constitutional: . Pt laying in bed; getting dressed by PT- with gown and compression stockings; NAD HENT:  Head: Normocephalic.  Facial abrasions more pink on nose this AM- more dry;  Eyes: EOM are normal. Neck: No tracheal deviation present.  CV: RRR- no M/R/G Respiratory: decreased breath sounds at bases-otherwise good air movement ; no W/R/R GI: Soft. NT, ND, protuberant, (+)BS   Musculoskeletal:     Comments: LE edema Very tight pecs B/L but also TTP over R deltoid and posterior R shoulder  Neurological: She is alert.   RUE: shoulder abduction 3+/5, elbow flexion/extension 3-/5, hand grip 4-/5.  LUE: shoulder abduction 2/5, elbow flexion/extension 2+/5, hand grip 3/5 B/l LE: HF, KE 3+/5, ADF 4-/5 Sensation intact to light touch   Skin: Skin is warm and dry.  Psychiatric: brighter affect; almost happy tears when discussing prognosis    Assessment/Plan: 1. Functional deficits secondary to critical illness myopathy which require 3+ hours per day of interdisciplinary therapy in a comprehensive inpatient rehab setting.  Physiatrist is providing close team supervision and 24 hour management of active medical problems listed below.  Physiatrist and rehab team continue to assess barriers to discharge/monitor patient progress toward functional and medical goals  Care Tool:  Bathing    Body parts bathed by patient: Abdomen, Face, Chest, Right lower leg, Left lower leg, Right upper leg, Left upper leg   Body parts bathed by helper: Right arm, Left arm, Front perineal area, Buttocks     Bathing assist Assist Level: Moderate Assistance - Patient 50 - 74%     Upper Body Dressing/Undressing Upper body dressing   What is the patient wearing?: Hospital gown only    Upper body assist Assist Level: Maximal Assistance - Patient 25 - 49%    Lower Body Dressing/Undressing Lower body dressing      What is the patient wearing?: Incontinence brief     Lower body assist Assist for lower body dressing: 2 Helpers     Toileting Toileting    Toileting assist Assist for toileting: Total Assistance - Patient < 25%     Transfers Chair/bed transfer  Transfers assist  Chair/bed transfer  activity did not occur: Safety/medical concerns  Chair/bed transfer assist level: Minimal Assistance - Patient > 75%     Locomotion Ambulation   Ambulation assist   Ambulation activity did not occur: Safety/medical concerns  Assist level: 2 helpers Assistive device: Walker-rolling Max distance: 60'   Walk 10 feet activity   Assist  Walk 10 feet activity did not occur: Safety/medical concerns  Assist level: 2 helpers Assistive device: Walker-rolling   Walk 50 feet activity   Assist Walk 50 feet with 2 turns  activity did not occur: Safety/medical concerns  Assist level: 2 helpers Assistive device: Walker-rolling    Walk 150 feet activity   Assist Walk 150 feet activity did not occur: Safety/medical concerns         Walk 10 feet on uneven surface  activity   Assist Walk 10 feet on uneven surfaces activity did not occur: Safety/medical concerns         Wheelchair     Assist Will patient use wheelchair at discharge?: Yes(TBD) Type of Wheelchair: Manual(Per chart review LT goals set for dischrage )           Wheelchair 50 feet with 2 turns activity    Assist    Wheelchair 50 feet with 2 turns activity did not occur: Safety/medical concerns       Wheelchair 150 feet activity     Assist  Wheelchair 150 feet activity did not occur: Safety/medical concerns       Blood pressure 108/66, pulse (!) 102, temperature 98.5 F (36.9 C), temperature source Oral, resp. rate 18, height 5\' 9"  (1.753 m), weight (!) 155.1 kg, last menstrual period 06/17/2012, SpO2 96 %.   Medical Problem List and Plan: 1.  Decreased functional mobility secondary to CIM due to acute hypoxemic respiratory failure due to ARDS/COVID-19 pneumonia.  Extubated 01/31/2019             -patient may shower             -ELOS/Goals: 27-32 days/Mod A            Continue CIR PT, OT  -satting well on room air, changed O2 to PRN.  Currently not using Right upper extremity strength has improved left upper extremity strength is still quite weak  2/16- pt's function has dramatically improved- walking 60 ft with RW min assist and showering with min assist;  2.  Antithrombotics: -DVT/anticoagulation: Lovenox.  Venous Doppler studies negative             -antiplatelet therapy: Aspirin 81 mg daily 3. Pain Management: Tylenol as needed  2/5- added voltaren gel for L shoulder QID  2/8: added lidocaine patch for right shoulder.  2/11 - needs some muscle relaxants for L muscular chest pain- will try flexeril  5mg  TID prn for muscle tightness  2/12- asking PT to do Estim in B/L shoulders due to pain/quesitonable subluxation which could be adding to pain 4. Mood: Provide emotional support             -antipsychotic agents: N/A 5. Neuropsych: This patient is capable of making decisions on her own behalf. 6. Skin/Wound Care: Routine skin checks  2/8: facial abraisons healing well.  7. Fluids/Electrolytes/Nutrition: Routine in and outs.    2/1- Hypokalemia- 3.1- will replete and recheck Wednesday  2/3- will recheck in AM  2/4- K+ 3.3- will replete 40 mEq x2 and recheck this weekend  2/8: K+ 3.0; ordered 75meq x2 today with repeat BMP tomorrow   morning.  2/9- K+ still 3.0- will check Mg level; start Magnesium Oxide 400 mg BID as well as K+ replete x3 40 mEq and recheck in AM  2/10- Mg 1.7- a little low, but already started PO repletion yesterday; K+ up to 3.7- finally got KCL to respond- will recheck on Friday to make sure staying up, since it kept staying llow  2/16- K+ 2.7- will replete 40 mEq x3 and then recheck in AM; will also start on standing K+ once gets controlled.  8.  Acute on chronic diastolic congestive heart failure.  Echocardiogram ejection fraction 50%.  Monitor for any signs of fluid overload.  Continue Demadex 20 mg twice daily             Daily weights.   Filed Weights   02/23/19 1444 02/24/19 0424 02/26/19 0341  Weight: (!) 159.7 kg (!) 157.9 kg (!) 155.1 kg    2/16- Weight loss notable- down to 155 kg- BMI was 53 when came in; now 50.5 9.  Diabetes mellitus with peripheral neuropathy.  Hemoglobin A1c 7.1.  NovoLog 15 units 3 times daily with meals, Lantus insulin 25 units twice daily.  Check blood sugars before meals and at bedtime.  Diabetic teaching   CBG (last 3)  Recent Labs    02/26/19 2020 02/27/19 0008 02/27/19 0504  GLUCAP 123* 127* 154*    2/3- place consult for DM coordinator since used to be on insulin pump at home- will see how they can help- I appreciate the  assistance  2/4- increased Lantus to 35 units BID and Novolog to 16 units TID with meals. Family to bring in insulin pump Friday? If so, will call DM coordinator to help.  2/5- will get DM coordinator to help if going to use insulin pump.   2/6- DM coordinator increased Lantus to 38 units BID, checking BGs more frequently and Novolog 0-15 units  2/7- increased Lantus to 42 units BID as of this evening  2/8: remain elevated but with improved control from yesterday.   2/9- back in insulin pump- will monitor  2/10- Called DM coordinator about BG basal starting at 8am- to move it back to her new breakfast time.  2/11- increasing BGs via DM coordinator by ~ 10%- via basal rate- BGs still out of control  2/12- Pt wants ot start back Ozempic which is home med- brought in yesterday from home since hospital doesn't stock- will ask DM coordinator to start it.   2/16- since restarted Ozempic, BGs are much better controlled -low to mid 100s- con't regimen             Monitor with increased mobility 10.  Cardiogenic shock.  Wean from pressors.  Monitor for orthostasis.  Continue Coreg 12.5 mg twice daily  2/4- will reduce to 6.25 mg BID since BP soft last night/this AM at 90s/50s 11.  Super obesity.  BMI 54.50.  Dietary follow-up.  Encourage weight loss 12.  Hyperlipidemia.  Crestor 13.  History of anemia.  Continue iron supplement.  Latest hemoglobin 11.6.             2/1- Hb 13.7 14.  Sickle cell trait.  Follow-up outpatient. 15. Constipation Last BM on 2/11, will increase Senokot to 2 tablets twice daily on 2/13, continent stool this morning and yesterday evening 16. L ear popping  2/7- will order Debrox drops for wax and see if helps. For L ear  2/10- improving finally per pt- will monitor  2/11- will clean out/irrigate L  ear if needed 17. Waist pressure ulcer  2/11- 5-6cm x <1 cm- superficial- will con't wound care and monitor- is granulating.   2/12- looking slightly better.     LOS: 18  days A FACE TO FACE EVALUATION WAS PERFORMED  Hedi Barkan 02/27/2019, 8:30 AM

## 2019-02-27 NOTE — Progress Notes (Signed)
Potassium this morning in labs was 2.6  Was 2.7 02/26/19, started on 54mEq PO potassium

## 2019-02-27 NOTE — Progress Notes (Signed)
Occupational Therapy Session Note  Patient Details  Name: Kaitlin [name] MRN: [id number] Date of Birth: 15-Nov-1965  Today's Date: 02/27/2019 OT Individual Time: [id number] OT Individual Time Calculation (min): 70 min    Short Term Goals: Week 3:  OT Short Term Goal 1 (Week 3): patient will complete UB bathing and dressing with mod A OT Short Term Goal 2 (Week 3): Pt will perform toilet tranfsers with mod A OT Short Term Goal 3 (Week 3): Pt will perform toleting tasks with max A OT Short Term Goal 4 (Week 3): Pt will perform UB dressing with mod A  Skilled Therapeutic Interventions/Progress Updates:    OT intervention with focus on sit<>stand, standing balance, and amb with RW to increase independence with BADLs. Sit>stand from recliner with mod A+2.  Sit<>stand from w/c with min A.  Pt engaged in standing task to toss horseshoes X 5. Pt required CGA for standing balance. Pt required extended rest breaks. Pt able to reach to buttocks with RUE but not LUE when standing with RW. Pt returned to room and remained in w/c with all needs within reach.   Therapy Documentation Precautions:  Precautions Precautions: Fall Precaution Comments: morbid obesity, monitor SpO2 and HR Restrictions Weight Bearing Restrictions: No  Pain:  PT denies pain this morning  Therapy/Group: Individual Therapy  Leroy Libman 02/27/2019, 11:52 AM

## 2019-02-27 NOTE — Patient Care Conference (Signed)
Inpatient RehabilitationTeam Conference and Plan of Care Update Date: 02/27/2019   Time: 11:00 AM    Patient Name: Kaitlin [name]      Medical Record Number: [id number]  Date of Birth: 1965/03/02 Sex: Female         Room/Bed: 4W14C/4W14C-01 Payor Info: Payor: MEDICAID [place] / Plan: MEDICAID [place] ACCESS / Product Type: *No Product type* /    Admit Date/Time:  02/09/2019  2:25 PM  Primary Diagnosis:  Critical illness myopathy  Patient Active Problem List   Diagnosis Date Noted  . Debility   . 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 COVID-19   . History of anemia   . Super obese   . Poorly controlled type 2 diabetes mellitus with peripheral neuropathy ([place])   . Acute on chronic diastolic (congestive) heart failure ([place])   . Hypernatremia   . Shock circulatory ([place])   . Septic shock ([place])   . Hypotension 01/13/2019  . Acute respiratory failure with hypoxemia ([place]) 01/13/2019  . Acute respiratory distress syndrome (ARDS) due to COVID-19 virus ([place]) 01/12/2019  . Grade I diastolic dysfunction [id number]  . 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 Dyspnea on exertion 05/19/2016  . Other fatigue 05/19/2016  . Absolute anemia 04/15/2016  . Left leg weakness 10/18/2014  . Left leg paresthesias 10/18/2014  . Acute left-sided weakness 10/18/2014  . Encounter for central line placement 03/08/2014  . Taking drug for chronic disease 03/08/2014  . H/O insertion of insulin pump 02/18/2014  . Long term current use of insulin ([place]) 02/18/2014  . Insulin pump in place 02/18/2014  . 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 Abnormal ballistocardiogram 05/18/2013  . Abnormal nuclear stress test 05/18/2013  . 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 NICM (nonischemic cardiomyopathy) ([place]) 05/01/2013  . Insulin dependent type 2 diabetes mellitus ([place]) 04/10/2013  . Sex counseling 04/10/2013  . Edema leg 04/10/2013  . Upper respiratory infection, viral 04/10/2013  . Pedal edema 03/29/2013  . Pneumonia  12/12/2012  . Essential hypertension 12/12/2012  . HLD (hyperlipidemia) 12/12/2012  . Tonsillar mass 12/01/2012  . 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 Leukocytosis   . Anemia   . High cholesterol   . Hypertension   . Blurred vision 09/11/2011  . Foot pain 09/11/2011  . Breast pain 09/11/2011  . Morbid obesity ([place]) 09/11/2011  . Fungal infection of nail 09/11/2011  . Avitaminosis D 09/11/2011    Expected Discharge Date: Expected Discharge Date: 03/10/19  Team Members Present: Physician leading conference: Dr. Courtney Heys Social Worker Present: Lennart Pall, LCSW(Auria Barbra Sarks, LCSW) Nurse Present: Other (comment)(Inka [place], LPN) Case Manager: Karene Fry, RN PT Present: Excell Seltzer, PT OT Present: Roanna Epley, Huston Foley, OT SLP Present: Jettie Booze, CF-SLP PPS Coordinator present : Gunnar Fusi, SLP     Current Status/Progress Goal Weekly Team Focus  Bowel/Bladder   continent of b/b; LBM: 02/15  remain continent of b/b  assist with toileting needs prn   Swallow/Nutrition/ Hydration             ADL's   bed mobility-mod/max A; sit<>stand-mod A+2; transfers=mod A+2; bathing-max A batjing; LB dressing-+2  min A overall, mod A bathing  activity tolerance, BADL training, BUE funciton, BUE strengthening, functional transfers   Mobility   mod to max A rolling, mod A supine to sit, +2 sit to stand and SPT with RW, +2 gait up to 60 ft with RW  mod A overall  sit to stands, endurance, gait training, bed  mobility   Communication             Safety/Cognition/ Behavioral Observations            Pain   no c/o pain  remain pain free  assess pain QS and prn   Skin   blisters on black (healing), pressure injury on nose healing  continue plan of care for wounds per orders; remain free of new skin breakdown/infection  assess skin QS and prn    Rehab Goals Patient on target to meet rehab goals: Yes *See Care Plan and progress notes for long  and short-term goals.     Barriers to Discharge  Current Status/Progress Possible Resolutions Date Resolved   Nursing                  PT  Weight                 OT                  SLP                SW                Discharge Planning/Teaching Needs:  Pt to d/c home with another daughter with home having better w/c accessibility.  3 adult children to share in providing 24/7 assistance.  Teaching needs still TBD   Team Discussion: BS better, K 2.6, on replacement, magnesium is low.  RN BS good, K+ replacement given, cont B/B, drsg change mid/low back.  OT shower yesterday mod a overall, total A pericare, worked on standing today.  PT mod A overall bed, min/mod stand, 130' walker min A.  Will need a hospital bed for home.   Revisions to Treatment Plan: N/A     Medical Summary Current Status: BGs doing better; got 40 mEq; continent of B/B- lBM yesterday- dressing change to mid low back Weekly Focus/Goal: min-max assist stand; 130 ft RW min assist (yesterday 60 ft)  Barriers to Discharge: Decreased family/caregiver support;Home enviroment access/layout;Weight;Other (comments);Medical stability;Wound care  Barriers to Discharge Comments: ICU myopathy Possible Resolutions to Barriers: OT- shower yesterday; mod assist overall- pericare total assist; goals for participating in hygiene.   Continued Need for Acute Rehabilitation Level of Care: The patient requires daily medical management by a physician with specialized training in physical medicine and rehabilitation for the following reasons: Direction of a multidisciplinary physical rehabilitation program to maximize functional independence : Yes Medical management of patient stability for increased activity during participation in an intensive rehabilitation regime.: Yes Analysis of laboratory values and/or radiology reports with any subsequent need for medication adjustment and/or medical intervention. : Yes   I attest that I was  present, lead the team conference, and concur with the assessment and plan of the team.   Retta Diones 02/28/2019, 11:52 AM   Team conference was held via web/ teleconference due to [place] - 19

## 2019-02-27 NOTE — Plan of Care (Signed)
  Problem: Consults Goal: RH GENERAL PATIENT EDUCATION Description: See Patient Education module for education specifics. Outcome: Progressing Goal: Skin Care Protocol Initiated - if Braden Score 18 or less Description: If consults are not indicated, leave blank or document N/A Outcome: Progressing   Problem: RH BLADDER ELIMINATION Goal: RH STG MANAGE BLADDER WITH ASSISTANCE Description: STG Manage Bladder With mod Assistance Outcome: Progressing   Problem: RH SKIN INTEGRITY Goal: RH STG SKIN FREE OF INFECTION/BREAKDOWN Description: Pt will be free of skin breakdown or pressure ulcers prior to DC with mod assist Outcome: Progressing Goal: RH STG MAINTAIN SKIN INTEGRITY WITH ASSISTANCE Description: STG Maintain Skin Integrity With mod Assistance. Outcome: Progressing Goal: RH STG ABLE TO PERFORM INCISION/WOUND CARE W/ASSISTANCE Description: STG Able To Perform Incision/Wound Care With mod Assistance. Outcome: Progressing   Problem: RH SAFETY Goal: RH STG ADHERE TO SAFETY PRECAUTIONS W/ASSISTANCE/DEVICE Description: STG Adhere to Safety Precautions With mod Assistance/Device. Outcome: Progressing

## 2019-02-27 NOTE — Progress Notes (Signed)
Occupational Therapy Weekly Progress Note  Patient Details  Name: Kaitlin [name] MRN: [id number] Date of Birth: Nov 15, 1965  Beginning of progress report period: February 16, 2019 End of progress report period: February 27, 2019  Patient has met 2 of 3 short term goals. Pt making steady progress with BADLs, bed mobility, sit<>stand, and standing balance. Pt performs sit<>stand from EOB and w/c with min A and requires min A for amb with RW. Bathing at shower level with mod A. Tot A for LB dressing and mod A for UB dressing. Pt fatigues quickly and requires multiple rest breaks during sessions. Pt tot A for toileting tasks. Pt with significant BUE weakness but improving.  Pt able to engage BUE in functional tasks but requires assistance.  Patient continues to demonstrate the following deficits: muscle weakness, decreased cardiorespiratoy endurance and decreased standing balance, decreased postural control and decreased balance strategies and therefore will continue to benefit from skilled OT intervention to enhance overall performance with BADL and Reduce care partner burden.  Patient progressing toward long term goals..  Continue plan of care.  OT Short Term Goals Week 2:  OT Short Term Goal 1 (Week 2): patient will complete rolling in bed with min A of one OT Short Term Goal 1 - Progress (Week 2): Met OT Short Term Goal 2 (Week 2): patient will complete sit to/from supine with mod A of one OT Short Term Goal 2 - Progress (Week 2): Met OT Short Term Goal 3 (Week 2): patient will complete UB bathing and dressing with mod A OT Short Term Goal 3 - Progress (Week 2): Progressing toward goal Week 3:  OT Short Term Goal 1 (Week 3): patient will complete UB bathing and dressing with mod A OT Short Term Goal 2 (Week 3): Pt will perform toilet tranfsers with mod A OT Short Term Goal 3 (Week 3): Pt will perform toleting tasks with max A OT Short Term Goal 4 (Week 3): Pt will perform UB dressing with mod  A   Leroy Libman 02/27/2019, 6:50 AM

## 2019-02-28 ENCOUNTER — Encounter (HOSPITAL_COMMUNITY): Payer: Medicaid Other | Admitting: Psychology

## 2019-02-28 ENCOUNTER — Inpatient Hospital Stay (HOSPITAL_COMMUNITY): Payer: Medicaid Other | Admitting: Physical Therapy

## 2019-02-28 ENCOUNTER — Inpatient Hospital Stay (HOSPITAL_COMMUNITY): Payer: Medicaid Other | Admitting: *Deleted

## 2019-02-28 ENCOUNTER — Inpatient Hospital Stay (HOSPITAL_COMMUNITY): Payer: Medicaid Other

## 2019-02-28 DIAGNOSIS — R5381 Other malaise: Secondary | ICD-10-CM

## 2019-02-28 LAB — GLUCOSE, CAPILLARY
Glucose-Capillary: 134 mg/dL — ABNORMAL HIGH (ref 70–99)
Glucose-Capillary: 144 mg/dL — ABNORMAL HIGH (ref 70–99)
Glucose-Capillary: 145 mg/dL — ABNORMAL HIGH (ref 70–99)
Glucose-Capillary: 160 mg/dL — ABNORMAL HIGH (ref 70–99)
Glucose-Capillary: 211 mg/dL — ABNORMAL HIGH (ref 70–99)

## 2019-02-28 MED ORDER — DICLOFENAC SODIUM 25 MG PO TBEC
50.0000 mg | DELAYED_RELEASE_TABLET | Freq: Two times a day (BID) | ORAL | Status: DC
  Administered 2019-02-28 – 2019-03-02 (×4): 50 mg via ORAL
  Filled 2019-02-28: qty 1
  Filled 2019-02-28 (×3): qty 2
  Filled 2019-02-28 (×4): qty 1

## 2019-02-28 MED ORDER — POTASSIUM CHLORIDE CRYS ER 20 MEQ PO TBCR
40.0000 meq | EXTENDED_RELEASE_TABLET | Freq: Two times a day (BID) | ORAL | Status: AC
  Administered 2019-02-28 (×2): 40 meq via ORAL
  Filled 2019-02-28 (×2): qty 2

## 2019-02-28 NOTE — Progress Notes (Signed)
Physical Therapy Session Note  Patient Details  Name: Kaitlin [name] MRN: [id number] Date of Birth: April 29, 1965  Today's Date: 02/28/2019 PT Individual Time: 0800-0915 PT Individual Time Calculation (min): 75 min   Short Term Goals: Week 3:  PT Short Term Goal 1 (Week 3): Pt will ambulate x 100 ft with assist x 1 PT Short Term Goal 2 (Week 3): Pt will perform sit to stand transfer with assist x 1 consistently PT Short Term Goal 3 (Week 3): Pt will tolerate standing x 5 min while performing functional task  Skilled Therapeutic Interventions/Progress Updates:     Patient in bed with Dr. Dagoberto Ligas in the room upon PT arrival. Patient alert and agreeable to PT session. Patient reported 7/10 R shoulder pain and 6/10 L shoulder pain during session, reported that she had Tylenol prior to session. PT provided repositioning, rest breaks, and distraction as pain interventions throughout session.   Focused session on shoulder pain management and ROM and bed mobility without pulling on bed rails to prevent strain on her shoulders.  Therapeutic Activity: Bed Mobility: Patient performed supine to/from sit with min A with HOB elevated without use of bed rails to reduce pulling with UEs due to B shoulder pain. Provided verbal cues for technique and minimal use of UEs. Patient sat EOB with reports of mild dizziness, stated that he blood sugar might be high. She checked her insulin pump monitor, showed BG 224, then she performed a finger stick, showed BG 223. She then gave herself an insulin shot while sitting EOB. Then returned to lying, as described above. Patient performed scooting up in bed with the bed in trendelenburg without use of UEs, pushing with LEs while bridging with supervision for cues x2. Educated patient to no longer pull herself up in the bed with her arms to manage shoulder pain.  Neuromuscular Re-ed: Patient performed the following Pain Neuroscience Re-education (PNE) activities while lying in  the bed: Patient agreed to participate in PNE, educated patient about pain exacerbation and neuro regulation. Performed guided motor imagery of B shoulder flexion and daily activities, and instructed patient on diaphragmatic breathing to manage pain and calm her nervous system prior to intervention and assessment of her shoulders.  Manual Therapy: Performed the following manual therapy and assessment of patient's shoulders to assess impairment, manage pain, and improve ROM: Shoulder ROM: Assessment: -L flexion: AROM <45 degrees limited by pain, PROM grossly 90 degrees limited by pain -L abduction: AROM <45 degrees limited by pain, PROM grossly 90 degrees limited by pain -R flexion: AROM grossly 100 degrees limited by pain, PROM grossly 140 degrees limited by pain -R abduction: AROM <45 degrees limited by pain, PROM grossly 100 degrees limited by pain B deltoid, supraspinatus, subacromial space TTP, joint mobility WNL with mild decreased mobility inferiorly bilaterally Intervention: -soft tissue mobilization to B deltoids, supraspinatus, upper traps, and pectorals -B inferior grade 2 joint mobilizations 2x45 sec for pain relief, unable to tolerate grade 3 -B upper trap stretch with manual over pressure Pain improved to 5/6 for L shoulder, and 6/10 in R shoulder, L flexion improved to 100 degrees PROM and 60 degrees AROM, R flexion improved to grossly 150 degrees PROM and 120 degrees AROM  Patient in bed at end of session with breaks locked, bed alarm set, and all needs within reach.    Therapy Documentation Precautions:  Precautions Precautions: Fall Precaution Comments: morbid obesity, monitor SpO2 and HR Restrictions Weight Bearing Restrictions: No    Therapy/Group: Individual Therapy  Dakhari Zuver L Randee Huston PT, DPT  02/28/2019, 12:19 PM

## 2019-02-28 NOTE — Progress Notes (Signed)
[facility] PHYSICAL MEDICINE & REHABILITATION PROGRESS NOTE   Subjective/Complaints:   Pt reports R>L shoulder still hurting- is so painful, cannot lift self with R arm anymore.   RUE actually feels weak.  Wondering about insurance covering incontinence supplies- explained willing to write Rx, but I don't think they are covered.    Goes frequently due to DM. Cannot always get there in time.    ROS- pt denies SOB, CP, abd pain; N/V/C/D/ vision changes, (-)Headache. Objective:   No results found. Recent Labs    02/26/19 0819  WBC 7.3  HGB 13.5  HCT 40.0  PLT 235   Recent Labs    02/26/19 0819 02/27/19 0555  NA 140 138  K 2.7* 2.6*  CL 94* 94*  CO2 32 30  GLUCOSE 227* 176*  BUN 12 12  CREATININE 0.84 0.84  CALCIUM 9.4 9.4    Intake/Output Summary (Last 24 hours) at 02/28/2019 0849 Last data filed at 02/27/2019 1843 Gross per 24 hour  Intake 532 ml  Output -  Net 532 ml     Physical Exam: Vital Signs Labs and vitals reviewed  Blood pressure 111/79, pulse 98, temperature 98.1 F (36.7 C), resp. rate 20, height 5\' 9"  (1.753 m), weight (!) 155.1 kg, last menstrual period 06/17/2012, SpO2 94 %. Vitals reviewed. Constitutional: . Pt laying in bed;by self; wearing shower/night cap; more talkative today; NAD HENT:  Head: Normocephalic.  Facial abrasions more pink on nose this AM- less pink Eyes: EOM are normal. Neck: No tracheal deviation present.  CV: RRR- no M/R/G Respiratory: decreased breath sounds at bases-otherwise good air movement ; no W/R/R GI: Soft. NT, ND, protuberant, (+)BS hypoactive   Musculoskeletal:     Comments: LE edema Very tight pecs B/L but also TTP over R deltoid and posterior R shoulder  Neurological: She is alert.   RUE: shoulder  2-/5; otherwise 4-/5 in biceps, triceps, WE, grip and finger abduction- Also VERY TTP over posterior shoulder- cannot do empty can test, but appears to have impingement syndrome.   LUE: shoulder abduction  2/5, elbow flexion/extension 2+/5, hand grip 3/5 B/l LE: HF, KE 3+/5, ADF 4-/5 Sensation intact to light touch  Skin: Skin is warm and dry.  Psychiatric: brighter affect; smiling more     Assessment/Plan: 1. Functional deficits secondary to critical illness myopathy which require 3+ hours per day of interdisciplinary therapy in a comprehensive inpatient rehab setting.  Physiatrist is providing close team supervision and 24 hour management of active medical problems listed below.  Physiatrist and rehab team continue to assess barriers to discharge/monitor patient progress toward functional and medical goals  Care Tool:  Bathing    Body parts bathed by patient: Abdomen, Face, Chest, Right lower leg, Left lower leg, Right upper leg, Left upper leg   Body parts bathed by helper: Right arm, Left arm, Front perineal area, Buttocks     Bathing assist Assist Level: Moderate Assistance - Patient 50 - 74%     Upper Body Dressing/Undressing Upper body dressing   What is the patient wearing?: Hospital gown only    Upper body assist Assist Level: Maximal Assistance - Patient 25 - 49%    Lower Body Dressing/Undressing Lower body dressing      What is the patient wearing?: Incontinence brief     Lower body assist Assist for lower body dressing: 2 Helpers     Toileting Toileting    Toileting assist Assist for toileting: Total Assistance - Patient < 25%  Transfers Chair/bed transfer  Transfers assist  Chair/bed transfer activity did not occur: Safety/medical concerns  Chair/bed transfer assist level: 2 Helpers(min assist for pivot with RW; +2 for encouragement)     Locomotion Ambulation   Ambulation assist   Ambulation activity did not occur: Safety/medical concerns  Assist level: Minimal Assistance - Patient > 75% Assistive device: Walker-rolling Max distance: 131'   Walk 10 feet activity   Assist  Walk 10 feet activity did not occur: Safety/medical  concerns  Assist level: Minimal Assistance - Patient > 75% Assistive device: Walker-rolling   Walk 50 feet activity   Assist Walk 50 feet with 2 turns activity did not occur: Safety/medical concerns  Assist level: Minimal Assistance - Patient > 75% Assistive device: Walker-rolling    Walk 150 feet activity   Assist Walk 150 feet activity did not occur: Safety/medical concerns         Walk 10 feet on uneven surface  activity   Assist Walk 10 feet on uneven surfaces activity did not occur: Safety/medical concerns         Wheelchair     Assist Will patient use wheelchair at discharge?: Yes(TBD) Type of Wheelchair: Manual(Per chart review LT goals set for dischrage )           Wheelchair 50 feet with 2 turns activity    Assist    Wheelchair 50 feet with 2 turns activity did not occur: Safety/medical concerns       Wheelchair 150 feet activity     Assist  Wheelchair 150 feet activity did not occur: Safety/medical concerns       Blood pressure 111/79, pulse 98, temperature 98.1 F (36.7 C), resp. rate 20, height 5\' 9"  (1.753 m), weight (!) 155.1 kg, last menstrual period 06/17/2012, SpO2 94 %.   Medical Problem List and Plan: 1.  Decreased functional mobility secondary to CIM due to acute hypoxemic respiratory failure due to ARDS/COVID-19 pneumonia.  Extubated 01/31/2019             -patient may shower             -ELOS/Goals: 27-32 days/Mod A            Continue CIR PT, OT  -satting well on room air, changed O2 to PRN.  Currently not using Right upper extremity strength has improved left upper extremity strength is still quite weak  2/16- pt's function has dramatically improved- walking 60 ft with RW min assist and showering with min assist;   2/17- asked about incontinence supplies- can try writing Rx, but don't think covered by insurance.  2.  Antithrombotics: -DVT/anticoagulation: Lovenox.  Venous Doppler studies negative              -antiplatelet therapy: Aspirin 81 mg daily 3. Pain Management: Tylenol as needed  2/5- added voltaren gel for L shoulder QID  2/8: added lidocaine patch for right shoulder.  2/11 - needs some muscle relaxants for L muscular chest pain- will try flexeril 5mg  TID prn for muscle tightness  2/12- asking PT to do Estim in B/L shoulders due to pain/quesitonable subluxation which could be adding to pain  2/17- will try Diclofenac 50 mg BID x 3 days to reduce Sx's of impingement syndrome.  4. Mood: Provide emotional support             -antipsychotic agents: N/A 5. Neuropsych: This patient is capable of making decisions on her own behalf. 6. Skin/Wound Care: Routine skin checks  2/8: facial  abraisons healing well.  7. Fluids/Electrolytes/Nutrition: Routine in and outs.    2/1- Hypokalemia- 3.1- will replete and recheck Wednesday  2/3- will recheck in AM  2/4- K+ 3.3- will replete 40 mEq x2 and recheck this weekend  2/8: K+ 3.0; ordered 36meq x2 today with repeat BMP tomorrow   morning.   2/9- K+ still 3.0- will check Mg level; start Magnesium Oxide 400 mg BID as well as K+ replete x3 40 mEq and recheck in AM  2/10- Mg 1.7- a little low, but already started PO repletion yesterday; K+ up to 3.7- finally got KCL to respond- will recheck on Friday to make sure staying up, since it kept staying llow  2/16- K+ 2.7- will replete 40 mEq x3 and then recheck in AM; will also start on standing K+ once gets controlled.   2/17- will give another KCl 40 mEq BID x 2 doses- labs not drawn. Will order BMP in AM.  8.  Acute on chronic diastolic congestive heart failure.  Echocardiogram ejection fraction 50%.  Monitor for any signs of fluid overload.  Continue Demadex 20 mg twice daily             Daily weights.   Filed Weights   02/23/19 1444 02/24/19 0424 02/26/19 0341  Weight: (!) 159.7 kg (!) 157.9 kg (!) 155.1 kg    2/16- Weight loss notable- down to 155 kg- BMI was 53 when came in; now 50.5 9.  Diabetes  mellitus with peripheral neuropathy.  Hemoglobin A1c 7.1.  NovoLog 15 units 3 times daily with meals, Lantus insulin 25 units twice daily.  Check blood sugars before meals and at bedtime.  Diabetic teaching   CBG (last 3)  Recent Labs    02/27/19 1958 02/28/19 0002 02/28/19 0358  GLUCAP 219* 144* 134*    2/3- place consult for DM coordinator since used to be on insulin pump at home- will see how they can help- I appreciate the assistance  2/4- increased Lantus to 35 units BID and Novolog to 16 units TID with meals. Family to bring in insulin pump Friday? If so, will call DM coordinator to help.  2/5- will get DM coordinator to help if going to use insulin pump.   2/6- DM coordinator increased Lantus to 38 units BID, checking BGs more frequently and Novolog 0-15 units  2/7- increased Lantus to 42 units BID as of this evening  2/8: remain elevated but with improved control from yesterday.   2/9- back in insulin pump- will monitor  2/10- Called DM coordinator about BG basal starting at 8am- to move it back to her new breakfast time.  2/11- increasing BGs via DM coordinator by ~ 10%- via basal rate- BGs still out of control  2/12- Pt wants ot start back Ozempic which is home med- brought in yesterday from home since hospital doesn't stock- will ask DM coordinator to start it.   2/16- since restarted Ozempic, BGs are much better controlled -low to mid 100s- con't regimen             Monitor with increased mobility 10.  Cardiogenic shock.  Wean from pressors.  Monitor for orthostasis.  Continue Coreg 12.5 mg twice daily  2/4- will reduce to 6.25 mg BID since BP soft last night/this AM at 90s/50s 11.  Super obesity.  BMI 54.50.  Dietary follow-up.  Encourage weight loss 12.  Hyperlipidemia.  Crestor 13.  History of anemia.  Continue iron supplement.  Latest hemoglobin 11.6.  2/1- Hb 13.7 14.  Sickle cell trait.  Follow-up outpatient. 15. Constipation Last BM on 2/11, will increase  Senokot to 2 tablets twice daily on 2/13, continent stool this morning and yesterday evening 16. L ear popping  2/7- will order Debrox drops for wax and see if helps. For L ear  2/10- improving finally per pt- will monitor  2/11- will clean out/irrigate L ear if needed 17. Waist pressure ulcer  2/11- 5-6cm x <1 cm- superficial- will con't wound care and monitor- is granulating.   2/12- looking slightly better.     LOS: 19 days A FACE TO FACE EVALUATION WAS PERFORMED  Kaitlin [name] 02/28/2019, 8:49 AM

## 2019-02-28 NOTE — Consult Note (Signed)
Neuropsychological Consultation   Patient:   Kaitlin [name]   DOB:   09-Mar-1965  MR Number:  [id number]  Location:  [facility] [id number] [place] [address] Dept: [place]: [phone]           Date of Service:   02/28/2019  Start Time:   9 AM End Time:   10 AM  Provider/Observer:  Ilean Skill, Psy.D.       Clinical Neuropsychologist       Billing Code/Service: 96158/96159  Chief Complaint:    Kaitlin [name] is a 54 year old female with history of anemia, bronchitis, bilateral cataracts, NICM, chronic diastolic CHF, diabetes with insulin pump, hyperlipidemia, hypertension, morbid obesity, sickle cell trait, history of thymoma with resection in 2015. Presented on 01/13/19 with increasing SOB, cough, generalized weakness and nausea.  SARS coronavirus positive 01/12/19.  Chest x-ray concerning for multifocal pneumonia.  Complicated course including need for intubation and extubated on 01/31/19 and slowly weaned off supplemental oxygen.  Now admitted for CIR program.     Patient had seen neurology in 2016 due to weakness and leg pain after tumor removal.  CT and MRI at time were WNL.    02/28/2019:  Patient with discharge date and plan for return home.  Still with significant difficulty sitting up on her own but has walked with assistance.  Reason for Service:  Patient referred for neuropsychological consultation due to coping and adjustment.  Below is the HPI for the current admission.  HPI: Kaitlin [name] is a 54 year old right-handed female with history of anemia, bronchitis, bilateral cataracts, NICM,chronic diastolic congestive heart failure, type 2 diabetes mellitus, hyperlipidemia, hypertension, morbid obesity with BMI 54.50, sickle cell trait, history of thymoma with resection 2015.  History taken from chart review and patient. Patient lives with her family.  1 level apartment  with level entry.  Independent prior to admission.  She is unemployed.  Patient does have family in the area to provide assistance at discharge.  She presented on 01/13/2019 with increasing SOB, cough, generalized weakness, and nausea. Patient had been exposed to her son who recently was positive for COVID-19.  In the ED WBC 8.7, hemoglobin 12.7, platelets 203,000, lactic acid normal, BUN 22, creatinine 1.17, D-dimer 1.54, troponin high-sensitivity 29, SARS coronavirus 01/12/2019 positive.  It was noted patient recently checked for coronavirus 11/19/2018 that was negative.  Chest x-ray bilateral diffuse patchy airspace opacities concerning for multifocal pneumonia.  Patient received remdesivir, dexamethasone as well as actemra.  Antibiotics were later broadened due to shock requiring pressors and stress steroids and has since been completed.  Received 1000 mL normal saline bolus for hypotension and her Coreg was initially held.  Echocardiogram with EF of [id number], grade 1 diastolic dysfunction without emboli. Bilateral lower extremity Dopplers negative for DVT.  Patient did require intubation and extubated on 01/31/2019 and weaned off supplemental oxygen.  Patient did complete 21 days of therapy for COVID-19 sustaining improvement isolation later discontinued and patient was transferred from [place] to [facility] 02/03/2019.  Patient has been receiving wound care for partial-thickness blistered areas.  Her diet has been advanced to a regular consistency.  Therapy evaluations completed and patient was admitted for a comprehensive rehab program. Please see preadmission assessment from earlier today as well.   Current Status:  Patient continues with steady improvement in mood and alertness.  She is oriented x4 and improving strength and  motor functions.     Behavioral Observation: Kaitlin [name]  presents as a 54 y.o.-year-old Right African American Female who appeared her stated age. her dress was  Appropriate and she was Well Groomed and her manners were Appropriate to the situation.  her participation was indicative of Appropriate behaviors.  There were any physical disabilities noted.  she displayed an appropriate level of cooperation and motivation.     Interactions:    Active Appropriate  Attention:   abnormal and attention span appeared shorter than expected for age  Memory:   within normal limits; recent and remote memory intact  Visuo-spatial:  not examined  Speech (Volume):  normal  Speech:   normal; normal  Thought Process:  Coherent and Relevant  Though Content:  WNL; not suicidal and not homicidal  Orientation:   person, place, time/date and situation  Judgment:   Good  Planning:   Fair  Affect:    Appropriate  Mood:    Euthymic  Insight:   Good  Intelligence:   normal  Medical History:   Past Medical History:  Diagnosis Date  . Anemia   . Bronchitis   . Cataracts, bilateral 01/12/2018  . CHF (congestive heart failure) ([place])   . Diabetes mellitus   . High cholesterol   . Hypertension    takes medicine to protect kidneys, does not have HTN  . Leukocytosis   . Morbid obesity ([place]) 09/11/2011   BMI 57  . Neuropathy 01/12/2018  . NICM (nonischemic cardiomyopathy) ([place]) 05/01/2013   Overview:  Last Assessment & Plan:  euvolemic on physical exam.   . Obesity   . Sickle cell trait ([place])   . Thymoma    Psychiatric History:  No prior psychiatric history  Family Med/Psych History:  Family History  Problem Relation Age of Onset  . Heart disease Father        No details  . Diabetes type II Other        Family HX  . Breast cancer Other    Impression/DX:  Kaitlin [name] is a 54 year old female with history of anemia, bronchitis, bilateral cataracts, NICM, chronic diastolic CHF, diabetes with insulin pump, hyperlipidemia, hypertension, morbid obesity, sickle cell trait, history of thymoma with resection in 2015. Presented on 01/13/19 with increasing SOB,  cough, generalized weakness and nausea.  SARS coronavirus positive 01/12/19.  Chest x-ray concerning for multifocal pneumonia.  Complicated course including need for intubation and extubated on 01/31/19 and slowly weaned off supplemental oxygen.  Now admitted for CIR program.     Patient continues with steady improvement in mood and alertness.  She is oriented x4 and improving strength and motor functions.     Disposition/Plan:  Worked on coping with long hospital course.  Patient likely stable for mood and coping and looking forward to more functional gains and return home.         Electronically Signed   _______________________ Ilean Skill, Psy.D.

## 2019-02-28 NOTE — Progress Notes (Signed)
Occupational Therapy Session Note  Patient Details  Name: Kaitlin [name] MRN: [id number] Date of Birth: June 27, 1965  Today's Date: 02/28/2019 OT Individual Time: 1300-1410 OT Individual Time Calculation (min): 70 min    Short Term Goals: Week 3:  OT Short Term Goal 1 (Week 3): patient will complete UB bathing and dressing with mod A OT Short Term Goal 2 (Week 3): Pt will perform toilet tranfsers with mod A OT Short Term Goal 3 (Week 3): Pt will perform toleting tasks with max A OT Short Term Goal 4 (Week 3): Pt will perform UB dressing with mod A  Skilled Therapeutic Interventions/Progress Updates:    Cotreatment with Recreational Therapist.Pt resting in recliner upon arrival and requesting use of BSC. Sit<>stand and amb with RW to [facility] with CGA/min A.  Pt required tot A for clothing management and hygiene. Pt returned to w/c and transitioned to Day Room.  Pt engaged in standing activities with [employer]. Pt challenged to reach with RUE behind her to simulate reaching for clothing management and hygiene.  Pt challenged to reach with RUE at shoulder height to grasp bean bag. All sit<>stand with supervision and standing balance with CGA. Pt returned to room and amb with RW to bed.  Pt required assistance bringing BLE onto bed but able to reposition in bed without assistance. Pt remained in bed with ice applied to B deltoid/upper arms.  All needs within reach.   Therapy Documentation Precautions:  Precautions Precautions: Fall Precaution Comments: morbid obesity, monitor SpO2 and HR Restrictions Weight Bearing Restrictions: No   Pain:  Pt c/o pain in BUE/upper arms (deltoid); ice applied   Therapy/Group: Individual Therapy  Leroy Libman 02/28/2019, 2:22 PM

## 2019-02-28 NOTE — Progress Notes (Signed)
Recreational Therapy Session Note  Patient Details  Name: ABBAGALE ZEIDMAN MRN: [id number] Date of Birth: 1965-05-31 Today's Date: 02/28/2019 Time:  [id number] Pain: no c/o Skilled Therapeutic Interventions/Progress Updates: Session focused activity tolerance, toileting, sit-stands, dynamic standing balance during co-treat with OT.  Pt expressed need for toielting at beginning of the session.  Pt performed sit-stand and ambulated ~5' to [facility] with min assist.  Pt required totals assist for clothing mangagment and hygiene.  Once finished with toileting, transported pt to the dayroom.  Pt stood to play [employer] reaching with RUE behind her and forward to obtain bean bag. Pt performed sit-stand x3 with supervision at [facility] and required contact guard assist for standing balance.  Emotional support provided throughout the session focusing on pt gains within the last 4-5 days. Provided pt a notepad to list her successes daily to help with coping and to recognize progress.  Encouraged pt to list things daily.  Pt stated understanding.  Therapy/Group: Co-Treatment  Edna Rede 02/28/2019, 2:28 PM

## 2019-02-28 NOTE — Progress Notes (Signed)
Physical Therapy Session Note  Patient Details  Name: Kaitlin [name] MRN: [id number] Date of Birth: 1965-04-25  Today's Date: 02/28/2019 PT Individual Time: [id number] PT Individual Time Calculation (min): 70 min   Short Term Goals: Week 3:  PT Short Term Goal 1 (Week 3): Pt will ambulate x 100 ft with assist x 1 PT Short Term Goal 2 (Week 3): Pt will perform sit to stand transfer with assist x 1 consistently PT Short Term Goal 3 (Week 3): Pt will tolerate standing x 5 min while performing functional task  Skilled Therapeutic Interventions/Progress Updates:    Pt received seated in bed, agreeable to PT session. Pt reports increase in B shoulder/deltoid soreness this date, not rated. Pt requesting muscle cream at end of session, RN notified. Pt reports urge to urinate. Rolling L/R with min A for placement of bedpan, pt able to continently void. Pt is dependent for pericare and donning of brief. Supine to sit with mod A for trunk control with HOB elevated. Pt is dependent to don shoes and pants while seated EOB. Sit stand with mod A from elevated bed to RW, dependent to pull pants up over hips. Ambulation 3 x 100 ft with RW and min A for balance, decreased gait speed and wide BOS. Sit to stand x 5 reps from w/c to RW with focus on RUE placement pushing up from w/c and LUE on RW for improved safety and mechanics of transfer. Pt initially hesitant due to fear of increasing B shoulder pain but is able to perform transfer well with min to mod A overall with no increase in pain. Pt is setup A for oral hygiene and washing her face while seated in w/c at sink. Stand pivot transfer w/c to recliner with RW and min A. Pt left seated in recliner in room with needs in reach at end of session. Attempted to place ice packs on B shoulders but unable to keep them in place while seated in chair, will attempt to place once pt once back in bed.  Therapy Documentation Precautions:  Precautions Precautions:  Fall Precaution Comments: morbid obesity, monitor SpO2 and HR Restrictions Weight Bearing Restrictions: No    Therapy/Group: Individual Therapy   Excell Seltzer, PT, DPT  02/28/2019, 12:53 PM

## 2019-03-01 ENCOUNTER — Inpatient Hospital Stay (HOSPITAL_COMMUNITY): Payer: Medicaid Other

## 2019-03-01 ENCOUNTER — Inpatient Hospital Stay (HOSPITAL_COMMUNITY): Payer: Medicaid Other | Admitting: Physical Therapy

## 2019-03-01 LAB — GLUCOSE, CAPILLARY
Glucose-Capillary: 137 mg/dL — ABNORMAL HIGH (ref 70–99)
Glucose-Capillary: 145 mg/dL — ABNORMAL HIGH (ref 70–99)
Glucose-Capillary: 164 mg/dL — ABNORMAL HIGH (ref 70–99)
Glucose-Capillary: 194 mg/dL — ABNORMAL HIGH (ref 70–99)
Glucose-Capillary: 219 mg/dL — ABNORMAL HIGH (ref 70–99)
Glucose-Capillary: 222 mg/dL — ABNORMAL HIGH (ref 70–99)

## 2019-03-01 LAB — BASIC METABOLIC PANEL
Anion gap: 9 (ref 5–15)
BUN: 13 mg/dL (ref 6–20)
CO2: 30 mmol/L (ref 22–32)
Calcium: 9.8 mg/dL (ref 8.9–10.3)
Chloride: 103 mmol/L (ref 98–111)
Creatinine, Ser: 1.09 mg/dL — ABNORMAL HIGH (ref 0.44–1.00)
GFR calc Af Amer: >60 mL/min (ref >60)
GFR calc non Af Amer: 59 mL/min — ABNORMAL LOW (ref >60)
Glucose, Bld: 183 mg/dL — ABNORMAL HIGH (ref 70–99)
Potassium: 3.8 mmol/L (ref 3.5–5.1)
Sodium: 142 mmol/L (ref 135–145)

## 2019-03-01 MED ORDER — POTASSIUM CHLORIDE CRYS ER 20 MEQ PO TBCR
20.0000 meq | EXTENDED_RELEASE_TABLET | Freq: Every day | ORAL | Status: DC
  Administered 2019-03-01 – 2019-03-05 (×5): 20 meq via ORAL
  Filled 2019-03-01 (×5): qty 1

## 2019-03-01 NOTE — Progress Notes (Signed)
Physical Therapy Session Note  Patient Details  Name: Kaitlin [name] MRN: [id number] Date of Birth: 09-10-1965  Today's Date: 03/01/2019 PT Individual Time: 1345-1455 PT Individual Time Calculation (min): 70 min   Short Term Goals: Week 2:  PT Short Term Goal 1 (Week 2): Pt will complete bed mobility with assist x 1 PT Short Term Goal 1 - Progress (Week 2): Met PT Short Term Goal 2 (Week 2): Pt will complete stand pivot transfer with RW and assist x 2 PT Short Term Goal 2 - Progress (Week 2): Met PT Short Term Goal 3 (Week 2): Pt will ambulate x 5 ft with LRAD and assist x 2 PT Short Term Goal 3 - Progress (Week 2): Met Week 3:  PT Short Term Goal 1 (Week 3): Pt will ambulate x 100 ft with assist x 1 PT Short Term Goal 2 (Week 3): Pt will perform sit to stand transfer with assist x 1 consistently PT Short Term Goal 3 (Week 3): Pt will tolerate standing x 5 min while performing functional task  Skilled Therapeutic Interventions/Progress Updates:   Session focused on functional transfers with RW, overall endurance and activity tolerance, gait training with RW, dynamic gait, and stair training. Pt performed functional transfers with RW with CGA/min assist overall for sit <> stand and CGA once in standing. Pt able to gait x 232' with RW and CGA for balance with decreased heel strike, wide BOS, and slightly flexed posture. O2 = 100% and HR = 120 bpm after gait activities. Pt requires extended rest break for recovery per report of fatigue. Engaged in standing alternating toe taps x 2 reps each side x 2 sets with seated break due to fatigue with min assist on 4" step. Progressed to 3" stair negotiation up/down 1 step and then x 2 steps with min assist and cues for technique. Performed 6" step x 1 and then x 2 with bilateral handrails with min assist for support. Pt then engaged in dynamic gait training to simulate home environment mobility with CGA x 50' with obstacle negotiation with 1 mild LOB  requiring min assist to correct.   Therapy Documentation Precautions:  Precautions Precautions: Fall Precaution Comments: morbid obesity, monitor SpO2 and HR Restrictions Weight Bearing Restrictions: No  Pain:  Denies pain.   Therapy/Group: Individual Therapy  Canary Brim [facility] 03/01/2019, 3:14 PM

## 2019-03-01 NOTE — Progress Notes (Signed)
Occupational Therapy Session Note  Patient Details  Name: Kaitlin [name] MRN: [id number] Date of Birth: 1965-07-20  Today's Date: 03/01/2019 OT Individual Time: 0930-1040 OT Individual Time Calculation (min): 70 min    Short Term Goals: Week 3:  OT Short Term Goal 1 (Week 3): patient will complete UB bathing and dressing with mod A OT Short Term Goal 2 (Week 3): Pt will perform toilet tranfsers with mod A OT Short Term Goal 3 (Week 3): Pt will perform toleting tasks with max A OT Short Term Goal 4 (Week 3): Pt will perform UB dressing with mod A  Skilled Therapeutic Interventions/Progress Updates:    Pt resting in recliner upon arrival and ready for therapy.  Pt declined changing clothing. OT intervention with focus on BUE activities, standing balance, activity tolerance, and safety awareness to increase independence with BADLs. Pt engaged in [employer] activity while seated.  Pt unable to raise BUE to chest level but still able to complete game/task spreading BUE adequately to be successful. Pt also engaged in standing task catching and tossing beach ball with/to Recreational therapist. Pt performed task X 3 with no LOB noted (CGA.) Pt transitioned to Day Room and completed three games of giant Connect 4 while standing with CGA. No LOB noted.  Pt pleased with progress today and returned to room.  Pt returned to recliner.  All needs within reach.  Therapy Documentation Precautions:  Precautions Precautions: Fall Precaution Comments: morbid obesity, monitor SpO2 and HR Restrictions Weight Bearing Restrictions: No   Pain: Pain Assessment Pain Scale: 0-10 Pain Score: 2  Pain Type: Acute pain Pain Location: Arm Pain Orientation: Left;Right Pain Descriptors / Indicators: Aching 2nd Pain Site Pain Score: 2 Pain Type: Acute pain Pain Location: Knee Pain Orientation: Left;Right Pain Descriptors / Indicators: Aching  repostioned    Therapy/Group: Individual Therapy  Leroy Libman 03/01/2019, 10:46 AM

## 2019-03-01 NOTE — Progress Notes (Signed)
Physical Therapy Session Note  Patient Details  Name: Kaitlin [name] MRN: [id number] Date of Birth: 02/11/1965  Today's Date: 03/01/2019 PT Individual Time: [id number] PT Individual Time Calculation (min): 55 min   Short Term Goals: Week 3:  PT Short Term Goal 1 (Week 3): Pt will ambulate x 100 ft with assist x 1 PT Short Term Goal 2 (Week 3): Pt will perform sit to stand transfer with assist x 1 consistently PT Short Term Goal 3 (Week 3): Pt will tolerate standing x 5 min while performing functional task  Skilled Therapeutic Interventions/Progress Updates:    Pt received seated in bed, agreeable to PT session. Pt appears in great spirits this AM and reports sleeping well. Pt reports ongoing soreness in B shoulders but reports improvement since yesterday and is premedicated and has Volatrin gel in place prior to start of therapy session. Rolling L/R with min A for dependent pericare and donning of new brief following urinary incontinence. Supine to sit with min A with HOB elevated and use of bedrails. Pt exhibits decreased assist needed for bed mobility this date with improvement in mobility noted when air mattress remains inflated. Pt is max A to don pants and shoes while seated EOB. Sit to stand with mod A to RW from elevated bed. Ambulation x 10 ft to recliner with RW and min A. Sit to stand with min A from recliner with w/c cushion in place for increased seat height. Ambulation x 10 ft to the sink. Pt is then able to perform oral hygiene and wash her face while seated in w/c at sink with setup A. Sit to stand with min A from w/c to RW. Ambulation x 10 ft back to recliner with RW and min A. Pt left seated in recliner in room with needs in reach at end of session.  Therapy Documentation Precautions:  Precautions Precautions: Fall Precaution Comments: morbid obesity, monitor SpO2 and HR Restrictions Weight Bearing Restrictions: No    Therapy/Group: Individual Therapy   Excell Seltzer,  PT, DPT  03/01/2019, 12:39 PM

## 2019-03-01 NOTE — Progress Notes (Signed)
[place] PHYSICAL MEDICINE & REHABILITATION PROGRESS NOTE   Subjective/Complaints:   Pt reports shoulder pain is somewhat better- a little better with Diclofenac- didn't take flexeril- makes her too sleepy.   Moving well with PT- per PT.    ROS- pt denies SOB, CP, abd pain; N/V/C/D/ vision changes, (-)Headache. Objective:   No results found. No results for input(s): WBC, HGB, HCT, PLT in the last 72 hours. Recent Labs    02/27/19 0555 03/01/19 0646  NA 138 142  K 2.6* 3.8  CL 94* 103  CO2 30 30  GLUCOSE 176* 183*  BUN 12 13  CREATININE 0.84 1.09*  CALCIUM 9.4 9.8    Intake/Output Summary (Last 24 hours) at 03/01/2019 1040 Last data filed at 03/01/2019 0800 Gross per 24 hour  Intake 636 ml  Output -  Net 636 ml     Physical Exam: Vital Signs Labs and vitals reviewed  Blood pressure (!) 83/58, pulse 89, temperature (!) 97.4 F (36.3 C), resp. rate 18, height 5\' 9"  (1.753 m), weight (!) 155.1 kg, last menstrual period 06/17/2012, SpO2 100 %. Vitals and labs reviewed. Constitutional: .Sitting up on bedside chair with pants and tennis shoes in place; wearing gown still; PT at bedside; NAD HENT:  Head: Normocephalic.  Facial abrasions more pink on nose this AM- less pink Eyes: EOM are normal. Neck: No tracheal deviation present.  CV: RRR- no M/R/G Respiratory: decreased breath sounds at bases-otherwise good air movement ; no W/R/R GI: Soft. NT, ND, protuberant, (+)BS hypoactive   Musculoskeletal:     Comments: LE edema- improved Very tight pecs B/L but also TTP over R deltoid and posterior R shoulder  Neurological: She is alert.   RUE: shoulder  2-/5; otherwise 4-/5 in biceps, triceps, WE, grip and finger abduction- Also VERY TTP over posterior shoulder- cannot do empty can test, but appears to have impingement syndrome. Slightly less TTP today  LUE: shoulder abduction 2/5, elbow flexion/extension 2+/5, hand grip 3/5 B/l LE: HF, KE 3+/5, ADF 4-/5 Sensation  intact to light touch  Skin: Skin is warm and dry.  Psychiatric: brighter affect; smiling more     Assessment/Plan: 1. Functional deficits secondary to critical illness myopathy which require 3+ hours per day of interdisciplinary therapy in a comprehensive inpatient rehab setting.  Physiatrist is providing close team supervision and 24 hour management of active medical problems listed below.  Physiatrist and rehab team continue to assess barriers to discharge/monitor patient progress toward functional and medical goals  Care Tool:  Bathing    Body parts bathed by patient: Abdomen, Face, Chest, Right lower leg, Left lower leg, Right upper leg, Left upper leg   Body parts bathed by helper: Right arm, Left arm, Front perineal area, Buttocks     Bathing assist Assist Level: Moderate Assistance - Patient 50 - 74%     Upper Body Dressing/Undressing Upper body dressing   What is the patient wearing?: Hospital gown only    Upper body assist Assist Level: Maximal Assistance - Patient 25 - 49%    Lower Body Dressing/Undressing Lower body dressing      What is the patient wearing?: Incontinence brief     Lower body assist Assist for lower body dressing: 2 Helpers     Toileting Toileting    Toileting assist Assist for toileting: Total Assistance - Patient < 25%     Transfers Chair/bed transfer  Transfers assist  Chair/bed transfer activity did not occur: Safety/medical concerns  Chair/bed transfer assist  level: Minimal Assistance - Patient > 75%     Locomotion Ambulation   Ambulation assist   Ambulation activity did not occur: Safety/medical concerns  Assist level: Minimal Assistance - Patient > 75% Assistive device: Walker-rolling Max distance: 100'   Walk 10 feet activity   Assist  Walk 10 feet activity did not occur: Safety/medical concerns  Assist level: Minimal Assistance - Patient > 75% Assistive device: Walker-rolling   Walk 50 feet  activity   Assist Walk 50 feet with 2 turns activity did not occur: Safety/medical concerns  Assist level: Minimal Assistance - Patient > 75% Assistive device: Walker-rolling    Walk 150 feet activity   Assist Walk 150 feet activity did not occur: Safety/medical concerns         Walk 10 feet on uneven surface  activity   Assist Walk 10 feet on uneven surfaces activity did not occur: Safety/medical concerns         Wheelchair     Assist Will patient use wheelchair at discharge?: Yes(TBD) Type of Wheelchair: Manual(Per chart review LT goals set for dischrage )           Wheelchair 50 feet with 2 turns activity    Assist    Wheelchair 50 feet with 2 turns activity did not occur: Safety/medical concerns       Wheelchair 150 feet activity     Assist  Wheelchair 150 feet activity did not occur: Safety/medical concerns       Blood pressure (!) 83/58, pulse 89, temperature (!) 97.4 F (36.3 C), resp. rate 18, height 5\' 9"  (1.753 m), weight (!) 155.1 kg, last menstrual period 06/17/2012, SpO2 100 %.   Medical Problem List and Plan: 1.  Decreased functional mobility secondary to CIM due to acute hypoxemic respiratory failure due to ARDS/COVID-19 pneumonia.  Extubated 01/31/2019             -patient may shower             -ELOS/Goals: 27-32 days/Mod A            Continue CIR PT, OT  -satting well on room air, changed O2 to PRN.  Currently not using Right upper extremity strength has improved left upper extremity strength is still quite weak  2/16- pt's function has dramatically improved- walking 60 ft with RW min assist and showering with min assist;   2/17- asked about incontinence supplies- can try writing Rx, but don't think covered by insurance.  2.  Antithrombotics: -DVT/anticoagulation: Lovenox.  Venous Doppler studies negative             -antiplatelet therapy: Aspirin 81 mg daily 3. Pain Management: Tylenol as needed  2/5- added voltaren gel  for L shoulder QID  2/8: added lidocaine patch for right shoulder.  2/11 - needs some muscle relaxants for L muscular chest pain- will try flexeril 5mg  TID prn for muscle tightness  2/12- asking PT to do Estim in B/L shoulders due to pain/quesitonable subluxation which could be adding to pain  2/17- will try Diclofenac 50 mg BID x 3 days to reduce Sx's of impingement syndrome.   2/18- explained to take x 3 days; can take flexeril prn.  4. Mood: Provide emotional support             -antipsychotic agents: N/A 5. Neuropsych: This patient is capable of making decisions on her own behalf. 6. Skin/Wound Care: Routine skin checks  2/8: facial abraisons healing well.  7. Fluids/Electrolytes/Nutrition: Routine in  and outs.    2/1- Hypokalemia- 3.1- will replete and recheck Wednesday  2/3- will recheck in AM  2/4- K+ 3.3- will replete 40 mEq x2 and recheck this weekend  2/8: K+ 3.0; ordered 32meq x2 today with repeat BMP tomorrow   morning.   2/9- K+ still 3.0- will check Mg level; start Magnesium Oxide 400 mg BID as well as K+ replete x3 40 mEq and recheck in AM  2/10- Mg 1.7- a little low, but already started PO repletion yesterday; K+ up to 3.7- finally got KCL to respond- will recheck on Friday to make sure staying up, since it kept staying llow  2/16- K+ 2.7- will replete 40 mEq x3 and then recheck in AM; will also start on standing K+ once gets controlled.   2/17- will give another KCl 40 mEq BID x 2 doses- labs not drawn. Will order BMP in AM.   2/18- K+ is 3.8 after repletion will order KCL daily-  8.  Acute on chronic diastolic congestive heart failure.  Echocardiogram ejection fraction 50%.  Monitor for any signs of fluid overload.  Continue Demadex 20 mg twice daily             Daily weights.   Filed Weights   02/23/19 1444 02/24/19 0424 02/26/19 0341  Weight: (!) 159.7 kg (!) 157.9 kg (!) 155.1 kg    2/16- Weight loss notable- down to 155 kg- BMI was 53 when came in; now 50.5 9.   Diabetes mellitus with peripheral neuropathy.  Hemoglobin A1c 7.1.  NovoLog 15 units 3 times daily with meals, Lantus insulin 25 units twice daily.  Check blood sugars before meals and at bedtime.  Diabetic teaching   CBG (last 3)  Recent Labs    03/01/19 0007 03/01/19 0410 03/01/19 0821  GLUCAP 164* 145* 194*    2/3- place consult for DM coordinator since used to be on insulin pump at home- will see how they can help- I appreciate the assistance  2/4- increased Lantus to 35 units BID and Novolog to 16 units TID with meals. Family to bring in insulin pump Friday? If so, will call DM coordinator to help.  2/5- will get DM coordinator to help if going to use insulin pump.   2/6- DM coordinator increased Lantus to 38 units BID, checking BGs more frequently and Novolog 0-15 units  2/7- increased Lantus to 42 units BID as of this evening  2/8: remain elevated but with improved control from yesterday.   2/9- back in insulin pump- will monitor  2/10- Called DM coordinator about BG basal starting at 8am- to move it back to her new breakfast time.  2/11- increasing BGs via DM coordinator by ~ 10%- via basal rate- BGs still out of control  2/12- Pt wants ot start back Ozempic which is home med- brought in yesterday from home since hospital doesn't stock- will ask DM coordinator to start it.   2/16- since restarted Ozempic, BGs are much better controlled -low to mid 100s- con't regimen  2/18- BGs 145-195- MUCH improved BGs - con't regimen.              Monitor with increased mobility 10.  Cardiogenic shock.  Wean from pressors.  Monitor for orthostasis.  Continue Coreg 12.5 mg twice daily  2/4- will reduce to 6.25 mg BID since BP soft last night/this AM at 90s/50s 11.  Super obesity.  BMI 54.50.  Dietary follow-up.  Encourage weight loss 12.  Hyperlipidemia.  Crestor 13.  History of anemia.  Continue iron supplement.  Latest hemoglobin 11.6.             2/1- Hb 13.7 14.  Sickle cell trait.   Follow-up outpatient. 15. Constipation Last BM on 2/11, will increase Senokot to 2 tablets twice daily on 2/13, continent stool this morning and yesterday evening 16. L ear popping  2/7- will order Debrox drops for wax and see if helps. For L ear  2/10- improving finally per pt- will monitor  2/11- will clean out/irrigate L ear if needed 17. Waist pressure ulcer  2/11- 5-6cm x <1 cm- superficial- will con't wound care and monitor- is granulating.   2/12- looking slightly better.    2/18- looking stable- con't wound care  LOS: 20 days A FACE TO FACE EVALUATION WAS PERFORMED  Kaitlin [name] 03/01/2019, 10:40 AM

## 2019-03-01 NOTE — Progress Notes (Signed)
Social Work Patient ID: Kaitlin [name], female   DOB: 09-19-1965, 54 y.o.   MRN: [id number]   Have reviewed team conference with pt and daughter, [name].  Both aware team continues to plan toward d/c date of 2/27.  DME ordered.  Still to arrange [facility] follow up.  Have set up family ed with [name] to take place on 2/20 and still need to schedule with pt's daughter, [name] for next week.  Alexanderjames Berg, LCSW

## 2019-03-02 ENCOUNTER — Other Ambulatory Visit: Payer: Self-pay

## 2019-03-02 ENCOUNTER — Inpatient Hospital Stay (HOSPITAL_COMMUNITY): Payer: Medicaid Other

## 2019-03-02 ENCOUNTER — Inpatient Hospital Stay (HOSPITAL_COMMUNITY): Payer: Medicaid Other | Admitting: Occupational Therapy

## 2019-03-02 ENCOUNTER — Inpatient Hospital Stay (HOSPITAL_COMMUNITY): Payer: Medicaid Other | Admitting: Physical Therapy

## 2019-03-02 LAB — GLUCOSE, CAPILLARY
Glucose-Capillary: 106 mg/dL — ABNORMAL HIGH (ref 70–99)
Glucose-Capillary: 111 mg/dL — ABNORMAL HIGH (ref 70–99)
Glucose-Capillary: 122 mg/dL — ABNORMAL HIGH (ref 70–99)
Glucose-Capillary: 129 mg/dL — ABNORMAL HIGH (ref 70–99)
Glucose-Capillary: 211 mg/dL — ABNORMAL HIGH (ref 70–99)

## 2019-03-02 MED ORDER — DICLOFENAC SODIUM 25 MG PO TBEC
50.0000 mg | DELAYED_RELEASE_TABLET | Freq: Two times a day (BID) | ORAL | Status: DC
  Administered 2019-03-02 – 2019-03-05 (×6): 50 mg via ORAL
  Filled 2019-03-02 (×7): qty 2

## 2019-03-02 NOTE — Progress Notes (Signed)
Recreational Therapy Session Note  Patient Details  Name: SHEMICKA ANGERMAN MRN: [id number] Date of Birth: June 20, 1965 Today's Date: 03/02/2019 TIme:  [id number] Pain: no c/o Skilled Therapeutic Interventions/Progress Updates: Session focused on discharge planning listing questions she had for treatment team prior to discharge.  Also reviewed pts accomplishments over the last couple of days.  Pt has been writing down her successes as suggested.  Pt affect appears brighter, more optimistic indicated through positive statements about perfomance, smiling, laughing, initiation and directing care as it pertains to therapy interventions/tasks.    Therapy/Group: Co-Treatment   Sharonda Llamas 03/02/2019, 9:14 AM

## 2019-03-02 NOTE — Progress Notes (Signed)
Occupational Therapy Session Note  Patient Details  Name: Kaitlin [name] MRN: [id number] Date of Birth: 06-16-1965  Today's Date: 03/02/2019 OT Individual Time: 1749-4496 OT Individual Time Calculation (min): 59 min    Short Term Goals: Week 1:  OT Short Term Goal 1 (Week 1): patient will complete rolling in bed with min A of one OT Short Term Goal 1 - Progress (Week 1): Progressing toward goal OT Short Term Goal 2 (Week 1): patient will complete sit to/from supine with mod A of one OT Short Term Goal 2 - Progress (Week 1): Progressing toward goal OT Short Term Goal 3 (Week 1): patient will tolerate sitting in w/c or recliner 1-2 hours per day OT Short Term Goal 3 - Progress (Week 1): Met OT Short Term Goal 4 (Week 1): patient will complete UB bathing and dressing with mod A OT Short Term Goal 4 - Progress (Week 1): Progressing toward goal  Skilled Therapeutic Interventions/Progress Updates:    Pt completed recliner to wheelchair transfers with min assist to start session.  She then was taken down to the ortho gym where she completed 3 sets of 3 mins on the UE ergonometer for BUE strengthening.  She was able to complete 2 sets peddling forward with resistance at level 5 for 1 minute each.  Oxygen sats post set at 95% with HR at 108.  The final set was completed peddling backwards.  Next, pt was moved over to the [employer] where she completed 2 sets of 1 min as well in sitting for BUE strengthening.  The lower part of the board was the only part used with pt needing mod facilitation for reaching the lights with the LUE and only supervision for the right.  She completed intervals of an average of 1.8 seconds per light as well as 2 seconds on the second.  Next, had pt return to her room per her request for use of the bathroom.  She was able to ambulate into the bathroom with min assist using the RW for support.  She then needed total assist for clothing management as well as hygiene.  She then  ambulated back out to the bedside recliner where she was left with the NT.  HR increased to 134 post ambulation with O2 sats still at 95%.  Call button and phone in reach at end of session.  Therapy Documentation Precautions:  Precautions Precautions: Fall Precaution Comments: morbid obesity, monitor SpO2 and HR Restrictions Weight Bearing Restrictions: No   Pain: Pain Assessment Pain Scale: Faces Pain Score: 0-No pain Pain Type: Acute pain Pain Location: Arm Pain Orientation: Right Patients Stated Pain Goal: 0 ADL: See Care Tool Section for some details of mobility and selfcare tasks  Therapy/Group: Individual Therapy  Danyiel Crespin OTR/L 03/02/2019, 12:23 PM

## 2019-03-02 NOTE — Progress Notes (Signed)
Physical Therapy Session Note  Patient Details  Name: Kaitlin [name] MRN: [id number] Date of Birth: August 28, 1965  Today's Date: 03/02/2019 PT Individual Time: 0800-0900 PT Individual Time Calculation (min): 60 min   Short Term Goals: Week 3:  PT Short Term Goal 1 (Week 3): Pt will ambulate x 100 ft with assist x 1 PT Short Term Goal 2 (Week 3): Pt will perform sit to stand transfer with assist x 1 consistently PT Short Term Goal 3 (Week 3): Pt will tolerate standing x 5 min while performing functional task  Skilled Therapeutic Interventions/Progress Updates:    Pt received seated in bed, agreeable to PT session. No complaints of pain. Pt reports improvement in B shoulder soreness over the past few days. Pt is dependent for requested pericare for thoroughness and in order to don new clean brief. Supine to sit with min A with HOB elevated and bed remaining inflated, use of bedrail. Pt is max A to don pants and shoes while seated EOB. Sit to stand with min A to RW from elevated bed to RW. Pt is CGA for standing balance with RW and is able to pull pants up over hips partially with max A. Ambulation x 10 ft to sink with RW and CGA then w/c placed behind her in order to sit. Pt is able to wash her face and perform oral hygiene while seated in w/c at sink with setup A. Sit to stand with CGA from w/c to RW. Ambulation x 10 ft to recliner with RW and CGA. Sit to stand x 5 reps from recliner to RW with CGA with focus on safe hand placement and sequencing of transfer. Cotreatment session with recreational therapy with focus on patient recording her accomplishments she has made each day during therapy. Pt left seated in recliner in room with needs in reach at end of session.  Therapy Documentation Precautions:  Precautions Precautions: Fall Precaution Comments: morbid obesity, monitor SpO2 and HR Restrictions Weight Bearing Restrictions: No    Therapy/Group: Individual Therapy   Excell Seltzer, PT,  DPT  03/02/2019, 10:49 AM

## 2019-03-02 NOTE — Progress Notes (Signed)
Physical Therapy Session Note  Patient Details  Name: Kaitlin [name] MRN: [id number] Date of Birth: 01-18-1965  Today's Date: 03/02/2019 PT Individual Time: [id number] PT Individual Time Calculation (min): 55 min   Short Term Goals: Week 3:  PT Short Term Goal 1 (Week 3): Pt will ambulate x 100 ft with assist x 1 PT Short Term Goal 2 (Week 3): Pt will perform sit to stand transfer with assist x 1 consistently PT Short Term Goal 3 (Week 3): Pt will tolerate standing x 5 min while performing functional task  Skilled Therapeutic Interventions/Progress Updates:    Session focused on functional gait training, transfers, and dynamic standing balance/tolerance. Pt performs sit <> stands throughout session x 12 reps with CGA to Supervision overall with good carryover of technique. Pt able to gait with RW x 70' x 2 during session with overall close supervision to CGA during turns and as fatigued for balance. Engaged in [place] in standing for dynamic standing balance/tolerance training with 1 UE support on RW for support, with seated rest breaks due to decreased standing tolerance during each frame. Pt able to maintain standing balance with supervision overall. 9th and 10th frame after standing, requires longer rest breaks due to fatigue and increased work of breathing. End of session returned back to bed with assist for management of pants to remove. Able go sit to supine with supervision for cues for technique and safety with air mattress. Rolled with use of bedrails with supervision to reposition and directed caregivers for appropriate placement of pillows for comfort.   Therapy Documentation Precautions:  Precautions Precautions: Fall Precaution Comments: morbid obesity, monitor SpO2 and HR Restrictions Weight Bearing Restrictions: No Pain:  Denies pain.    Therapy/Group: Individual Therapy  Canary Brim Ivory Broad, PT, DPT, CBIS  03/02/2019, 3:34 PM

## 2019-03-02 NOTE — Progress Notes (Signed)
[place] PHYSICAL MEDICINE & REHABILITATION PROGRESS NOTE   Subjective/Complaints:   Pt reports arms/shoulders doing better, would like to take Diclofenac for longer if possible- by a few days.   R>L still painful/shoulders.   LBM last night- denies constipation; Also feels like has "heaviness" with R side when using ICS or taking big breath- feels tighter.     ROS- pt denies N/V/C/D, SOB, or CP Objective:   No results found. No results for input(s): WBC, HGB, HCT, PLT in the last 72 hours. Recent Labs    03/01/19 0646  NA 142  K 3.8  CL 103  CO2 30  GLUCOSE 183*  BUN 13  CREATININE 1.09*  CALCIUM 9.8    Intake/Output Summary (Last 24 hours) at 03/02/2019 0942 Last data filed at 03/02/2019 0845 Gross per 24 hour  Intake 910 ml  Output -  Net 910 ml     Physical Exam: Vital Signs Vitals and nursing notes reviewed  Blood pressure 107/71, pulse 96, temperature 98.9 F (37.2 C), resp. rate 18, height 5\' 9"  (1.753 m), weight (!) 159.7 kg, last menstrual period 06/17/2012, SpO2 100 %.    Constitutional: awake, alert, sitting EOB with PT working on helping her get dressed; nursing giving meds, NAD Head: Normocephalic.  Facial abrasions more pink on nose this AM- less pink Eyes: EOM are normal. Neck: No tracheal deviation present.  CV: RRR Pulm: CTA B/: however slightly decreased, a little tight on R base GI: Soft. NT, ND, protuberant, (+)BS hypoactive   Musculoskeletal:     Comments: LE edema- improved R posterior shoulder very TTP, but less than before; same on L side- less TTP than R side; empty can test (+) B/L for impingement syndrome Neurological: She is alert.   RUE: shoulder  2-/5; otherwise 4-/5 in biceps, triceps, WE, grip and finger abduction- Also VERY TTP over posterior shoulder- cannot do empty can test, but appears to have impingement syndrome. Slightly less TTP today  LUE: shoulder abduction 2/5, elbow flexion/extension 2+/5, hand grip 3/5 B/l  LE: HF, KE 3+/5, ADF 4-/5 Sensation intact to light touch  Skin: Skin is warm and dry.  Psychiatric: more interactive; awake, smiling;      Assessment/Plan: 1. Functional deficits secondary to critical illness myopathy which require 3+ hours per day of interdisciplinary therapy in a comprehensive inpatient rehab setting.  Physiatrist is providing close team supervision and 24 hour management of active medical problems listed below.  Physiatrist and rehab team continue to assess barriers to discharge/monitor patient progress toward functional and medical goals  Care Tool:  Bathing    Body parts bathed by patient: Abdomen, Face, Chest, Right lower leg, Left lower leg, Right upper leg, Left upper leg   Body parts bathed by helper: Right arm, Left arm, Front perineal area, Buttocks     Bathing assist Assist Level: Moderate Assistance - Patient 50 - 74%     Upper Body Dressing/Undressing Upper body dressing   What is the patient wearing?: Hospital gown only    Upper body assist Assist Level: Maximal Assistance - Patient 25 - 49%    Lower Body Dressing/Undressing Lower body dressing      What is the patient wearing?: Incontinence brief     Lower body assist Assist for lower body dressing: 2 Helpers     Toileting Toileting    Toileting assist Assist for toileting: Total Assistance - Patient < 25%     Transfers Chair/bed transfer  Transfers assist  Chair/bed transfer  activity did not occur: Safety/medical concerns  Chair/bed transfer assist level: Contact Guard/Touching assist     Locomotion Ambulation   Ambulation assist   Ambulation activity did not occur: Safety/medical concerns  Assist level: Contact Guard/Touching assist Assistive device: Walker-rolling Max distance: 232'   Walk 10 feet activity   Assist  Walk 10 feet activity did not occur: Safety/medical concerns  Assist level: Contact Guard/Touching assist Assistive device: Walker-rolling    Walk 50 feet activity   Assist Walk 50 feet with 2 turns activity did not occur: Safety/medical concerns  Assist level: Contact Guard/Touching assist Assistive device: Walker-rolling    Walk 150 feet activity   Assist Walk 150 feet activity did not occur: Safety/medical concerns  Assist level: Contact Guard/Touching assist Assistive device: Walker-rolling    Walk 10 feet on uneven surface  activity   Assist Walk 10 feet on uneven surfaces activity did not occur: Safety/medical concerns         Wheelchair     Assist Will patient use wheelchair at discharge?: Yes(TBD) Type of Wheelchair: Manual(Per chart review LT goals set for dischrage )           Wheelchair 50 feet with 2 turns activity    Assist    Wheelchair 50 feet with 2 turns activity did not occur: Safety/medical concerns       Wheelchair 150 feet activity     Assist  Wheelchair 150 feet activity did not occur: Safety/medical concerns       Blood pressure 107/71, pulse 96, temperature 98.9 F (37.2 C), resp. rate 18, height 5\' 9"  (1.753 m), weight (!) 159.7 kg, last menstrual period 06/17/2012, SpO2 100 %.   Medical Problem List and Plan: 1.  Decreased functional mobility secondary to CIM due to acute hypoxemic respiratory failure due to ARDS/COVID-19 pneumonia.  Extubated 01/31/2019             -patient may shower             -ELOS/Goals: 27-32 days/Mod A            Continue CIR PT, OT  -satting well on room air, changed O2 to PRN.  Currently not using Right upper extremity strength has improved left upper extremity strength is still quite weak  2/16- pt's function has dramatically improved- walking 60 ft with RW min assist and showering with min assist;   2/17- asked about incontinence supplies- can try writing Rx, but don't think covered by insurance.  2.  Antithrombotics: -DVT/anticoagulation: Lovenox.  Venous Doppler studies negative             -antiplatelet therapy: Aspirin  81 mg daily 3. Pain Management: Tylenol as needed  2/5- added voltaren gel for L shoulder QID  2/8: added lidocaine patch for right shoulder.  2/11 - needs some muscle relaxants for L muscular chest pain- will try flexeril 5mg  TID prn for muscle tightness  2/12- asking PT to do Estim in B/L shoulders due to pain/quesitonable subluxation which could be adding to pain  2/17- will try Diclofenac 50 mg BID x 3 days to reduce Sx's of impingement syndrome.   2/18- explained to take x 3 days; can take flexeril prn.  2/19- changed diclofenac to 1 week and then stop- shouldn't affect kidneys at lower dose x  1week since helping shoulders so much and don't want to do steroid injections since would send BGs sky high after steroid injections.   4. Mood: Provide emotional support             -  antipsychotic agents: N/A 5. Neuropsych: This patient is capable of making decisions on her own behalf. 6. Skin/Wound Care: Routine skin checks  2/8: facial abraisons healing well.  7. Fluids/Electrolytes/Nutrition: Routine in and outs.    2/9- K+ still 3.0- will check Mg level; start Magnesium Oxide 400 mg BID as well as K+ replete x3 40 mEq and recheck in AM  2/10- Mg 1.7- a little low, but already started PO repletion yesterday; K+ up to 3.7- finally got KCL to respond- will recheck on Friday to make sure staying up, since it kept staying llow  2/16- K+ 2.7- will replete 40 mEq x3 and then recheck in AM; will also start on standing K+ once gets controlled.   2/17- will give another KCl 40 mEq BID x 2 doses- labs not drawn. Will order BMP in AM.   2/18- K+ is 3.8 after repletion will order KCL daily-   2/19- will check Monday 8.  Acute on chronic diastolic congestive heart failure.  Echocardiogram ejection fraction 50%.  Monitor for any signs of fluid overload.  Continue Demadex 20 mg twice daily             Daily weights.   Filed Weights   02/24/19 0424 02/26/19 0341 03/02/19 0418  Weight: (!) 157.9 kg (!) 155.1  kg (!) 159.7 kg    2/16- Weight loss notable- down to 155 kg- BMI was 53 when came in; now 50.5  2/19- Weight was stable last 2 weights, but up to 159 kg - jumped faster than expected- will check with nursing;  9.  Diabetes mellitus with peripheral neuropathy.  Hemoglobin A1c 7.1.  NovoLog 15 units 3 times daily with meals, Lantus insulin 25 units twice daily.  Check blood sugars before meals and at bedtime.  Diabetic teaching   CBG (last 3)  Recent Labs    03/01/19 2024 03/02/19 0021 03/02/19 0414  GLUCAP 222* 106* 111*    2/3- place consult for DM coordinator since used to be on insulin pump at home- will see how they can help- I appreciate the assistance  2/6- DM coordinator increased Lantus to 38 units BID, checking BGs more frequently and Novolog 0-15 units  2/7- increased Lantus to 42 units BID as of this evening  2/8: remain elevated but with improved control from yesterday.   2/9- back in insulin pump- will monitor  2/10- Called DM coordinator about BG basal starting at 8am- to move it back to her new breakfast time.  2/12- Pt wants ot start back Ozempic which is home med- brought in yesterday from home since hospital doesn't stock- will ask DM coordinator to start it.   2/16- since restarted Ozempic, BGs are much better controlled -low to mid 100s- con't regimen  2/18- BGs 145-195- MUCH improved BGs - con't regimen.   2/19- overall better with 1 BG 200; otherwise low 100s- great control- con't curent regimen.              Monitor with increased mobility 10.  Cardiogenic shock.  Wean from pressors.  Monitor for orthostasis.  Continue Coreg 12.5 mg twice daily  2/4- will reduce to 6.25 mg BID since BP soft last night/this AM at 90s/50s 11.  Super obesity.  BMI 54.50.  Dietary follow-up.  Encourage weight loss 12.  Hyperlipidemia.  Crestor 13.  History of anemia.  Continue iron supplement.  Latest hemoglobin 11.6.             2/1- Hb 13.7 14.  Sickle cell trait.  Follow-up  outpatient. 15. Constipation Last BM on 2/11, will increase Senokot to 2 tablets twice daily on 2/13, continent stool this morning and yesterday evening  2/19- LBM last night- going regularly now 16. L ear popping  2/7- will order Debrox drops for wax and see if helps. For L ear  2/10- improving finally per pt- will monitor  2/11- will clean out/irrigate L ear if needed 17. Waist pressure ulcer  2/11- 5-6cm x <1 cm- superficial- will con't wound care and monitor- is granulating.   2/12- looking slightly better.    2/18- looking stable- con't wound care  LOS: 21 days A FACE TO FACE EVALUATION WAS PERFORMED  Arra Connaughton 03/02/2019, 9:42 AM

## 2019-03-02 NOTE — Progress Notes (Signed)
Physical Therapy Session Note  Patient Details  Name: Kaitlin [name] MRN: [id number] Date of Birth: Aug 11, 1965  Today's Date: 03/02/2019 PT Individual Time: 3557-3220 PT Individual Time Calculation (min): 25 min   Short Term Goals: Week 1:  PT Short Term Goal 1 (Week 1): Pt will perform R/L rolling in bed with +2 mod assist PT Short Term Goal 1 - Progress (Week 1): Met PT Short Term Goal 2 (Week 1): Pt will perform supine<>sit with +2 mod assist PT Short Term Goal 2 - Progress (Week 1): Met PT Short Term Goal 3 (Week 1): Pt will initiate sit>stand using lift equipment as needed PT Short Term Goal 3 - Progress (Week 1): Met PT Short Term Goal 4 (Week 1): Pt will tolerate sitting OOB in w/c for at least 30 minutes PT Short Term Goal 4 - Progress (Week 1): Met Week 2:  PT Short Term Goal 1 (Week 2): Pt will complete bed mobility with assist x 1 PT Short Term Goal 1 - Progress (Week 2): Met PT Short Term Goal 2 (Week 2): Pt will complete stand pivot transfer with RW and assist x 2 PT Short Term Goal 2 - Progress (Week 2): Met PT Short Term Goal 3 (Week 2): Pt will ambulate x 5 ft with LRAD and assist x 2 PT Short Term Goal 3 - Progress (Week 2): Met Week 3:  PT Short Term Goal 1 (Week 3): Pt will ambulate x 100 ft with assist x 1 PT Short Term Goal 2 (Week 3): Pt will perform sit to stand transfer with assist x 1 consistently PT Short Term Goal 3 (Week 3): Pt will tolerate standing x 5 min while performing functional task  Skilled Therapeutic Interventions/Progress Updates:    PAIN 3/10 low back and RUE, treatment to tolerance, repositioing, increased mobilization  Pt initially OOB in recliner and agreeable to treatment session with focus on functional mobility and endurance.  STS from recliner w/cga of 1.  Gait 346f w/RW and cga, wc following for safety, good cadence noted, multiple turns both L and R during gait without balance loss/instability. Pt rested 4-5 min, 02 sats 97%, HR 120  following gt. Repeated STS from wc x 5 reps for functional strength training. Gait 147fw/RW and turn/sit to recliner w/cga only.  Pt very pleased w/improving gait endurance/safety.   Discussed car transfer training and need to practice this prior to dc.  Pt voiced understanding.  Stated she would be going home in daughters SUV (Equinox) and requested she have daughter measure seat height so this could be simulated in car transfer training. Pt left oob in recliner w/chair alarm set and needs in reach.  Therapy Documentation Precautions:  Precautions Precautions: Fall Precaution Comments: morbid obesity, monitor SpO2 and HR Restrictions Weight Bearing Restrictions: No    Therapy/Group: Individual Therapy  BaCallie FieldingPTRockcastle/19/2021, 10:39 AM

## 2019-03-02 NOTE — Plan of Care (Signed)
Upgraded sitting balance, transfer, bed mobility, and ambulation goals due to great progress from mod A overall to Supervision to min A overall as well as increased gait distance from 10 ft to 300 ft.

## 2019-03-03 ENCOUNTER — Encounter (HOSPITAL_COMMUNITY): Payer: Medicaid Other

## 2019-03-03 ENCOUNTER — Ambulatory Visit (HOSPITAL_COMMUNITY): Payer: Medicaid Other | Admitting: Physical Therapy

## 2019-03-03 DIAGNOSIS — E876 Hypokalemia: Secondary | ICD-10-CM

## 2019-03-03 DIAGNOSIS — M25512 Pain in left shoulder: Secondary | ICD-10-CM

## 2019-03-03 DIAGNOSIS — I5033 Acute on chronic diastolic (congestive) heart failure: Secondary | ICD-10-CM

## 2019-03-03 DIAGNOSIS — R7309 Other abnormal glucose: Secondary | ICD-10-CM

## 2019-03-03 DIAGNOSIS — D573 Sickle-cell trait: Secondary | ICD-10-CM

## 2019-03-03 DIAGNOSIS — E1142 Type 2 diabetes mellitus with diabetic polyneuropathy: Secondary | ICD-10-CM

## 2019-03-03 DIAGNOSIS — U071 COVID-19: Secondary | ICD-10-CM

## 2019-03-03 LAB — GLUCOSE, CAPILLARY
Glucose-Capillary: 131 mg/dL — ABNORMAL HIGH (ref 70–99)
Glucose-Capillary: 150 mg/dL — ABNORMAL HIGH (ref 70–99)
Glucose-Capillary: 151 mg/dL — ABNORMAL HIGH (ref 70–99)
Glucose-Capillary: 155 mg/dL — ABNORMAL HIGH (ref 70–99)
Glucose-Capillary: 185 mg/dL — ABNORMAL HIGH (ref 70–99)

## 2019-03-03 NOTE — Progress Notes (Signed)
Physical Therapy Session Note  Patient Details  Name: Kaitlin [name] MRN: [id number] Date of Birth: 11/02/1965  Today's Date: 03/03/2019 PT Individual Time: 1102-1202 PT Individual Time Calculation (min): 60 min   Short Term Goals: Week 3:  PT Short Term Goal 1 (Week 3): Pt will ambulate x 100 ft with assist x 1 PT Short Term Goal 2 (Week 3): Pt will perform sit to stand transfer with assist x 1 consistently PT Short Term Goal 3 (Week 3): Pt will tolerate standing x 5 min while performing functional task  Skilled Therapeutic Interventions/Progress Updates:    Pt received sitting in w/c with her daughter present for family education and pt agreeable to therapy session. Requests to brush teeth and wash face - performed the tasks from w/c level with min assist due to B UE weakness limiting reaching. Educated pt's daughter regarding her current functional mobility level and need to use RW for all standing/ambulation. Performed sit<>stands using RW with CGA for steadying throughout session. Gait training 241ft towards ortho therapy gym using RW with CGA for steadying and +2 providing close w/c follow for patient safety - demonstrates good B LE step length, increased support on B UEs, decreased B LE foot clearance, decreased ankle DF (L more impaired than R) and good gait speed - HR 122bpm and SpO2 99% on RA. After therapeutic seated rest break fo HR to decrease gait training ~77ft remainder of distance into ortho gym using RW again with CGA and +2 w/c follow for safety - SpO2 97% and HR 117bpm. Discussed car transfer technique with pt reporting she has a SUV ([employer] Equ[employer]ox) and decided pt should have enough clearance to turn and sit on car seat to then swing legs into the vehicle. Simulated ambulatory car transfer (SUV height) using RW with CGA for steadying and +2 with close w/c for patient safety - pt able to sit onto car seat safely but declined placing LEs in/out of vehicle due to small foot space in  simulated car. Educated pt that she is to talk with MD regarding when it is safe for her to drive - also discussed considering B UE strength for steering and B LE strength to perform quick pedal management in event she needed to stop the vehicle quickly. Stand pivot simulated car>w/c using RW with CGA for steadying. Transported towards ADL apartment in w/c. Gait training from hallway ~62ft in and back out of ADL apartment focusing on gait training with transition to/from different floors (tile to/from carpet) and AD management in smaller spaces all with CGA for steadying and +2 providing close w/c follow for patient safety. Gait training 161ft using RW back to room with CGA for steadying and +2 close w/c follow for safety. Pt reports that she will need to perform 1curb step navigation using RW to get into the apartment - will notify primary PT to continue working towards this. Pt's daughter reports no questions/concerns at this time. Pt left seated in recliner (w/c cushion under her for increased seat height) with needs in reach and her daughter present.   Therapy Documentation Precautions:  Precautions Precautions: Fall Precaution Comments: morbid obesity, monitor SpO2 and HR Restrictions Weight Bearing Restrictions: No  Pain:   No complaints of pain during session.   Therapy/Group: Individual Therapy  Tawana Scale, PT, DPT 03/03/2019, 7:59 AM

## 2019-03-03 NOTE — Progress Notes (Signed)
Occupational Therapy Session Note  Patient Details  Name: Kaitlin [name] MRN: [id number] Date of Birth: Feb 15, 1965  Today's Date: 03/03/2019 OT Individual Time: 1000-1058 OT Individual Time Calculation (min): 58 min    Short Term Goals: Week 1:  OT Short Term Goal 1 (Week 1): patient will complete rolling in bed with min A of one OT Short Term Goal 1 - Progress (Week 1): Progressing toward goal OT Short Term Goal 2 (Week 1): patient will complete sit to/from supine with mod A of one OT Short Term Goal 2 - Progress (Week 1): Progressing toward goal OT Short Term Goal 3 (Week 1): patient will tolerate sitting in w/c or recliner 1-2 hours per day OT Short Term Goal 3 - Progress (Week 1): Met OT Short Term Goal 4 (Week 1): patient will complete UB bathing and dressing with mod A OT Short Term Goal 4 - Progress (Week 1): Progressing toward goal  Skilled Therapeutic Interventions/Progress Updates:    1;1. Pain not reported during session. Daughter present for family ed this date. Pt agreeable to toileting and dressing this session. Edu to daughter about RW safety, body mechanics, providing min A for sit to stand and guarding A for ambulation, toileting hygiene and AE for ADLs. Pt completes transfer at ambulatory level with daughter to/from [facility] over toilet. Daughter provides A for posterior hygiene and pt gets back to bed to finish peri care in front. PT dons shirt with MOD A and pants with A to pull up in back provided by daughter. Pt visibly anxious with new task (LB dressing) and requires VC for calming breath and AE use. OT edu re TTB/sequening with daughter however time limiting ability for hands on practice. Exited session with pt seated in bed, exit alarm on and call light in reach  Therapy Documentation Precautions:  Precautions Precautions: Fall Precaution Comments: morbid obesity, monitor SpO2 and HR Restrictions Weight Bearing Restrictions: No General:   Vital Signs: Therapy  Vitals Temp: (!) 97.5 F (36.4 C) Pulse Rate: 99 Resp: 20 BP: (!) 101/59 Patient Position (if appropriate): Lying Oxygen Therapy SpO2: 95 % O2 Device: Room Air Pain:   ADL: ADL Eating: Minimal assistance Where Assessed-Eating: Bed level Grooming: Minimal assistance Where Assessed-Grooming: Bed level Upper Body Bathing: Maximal assistance Where Assessed-Upper Body Bathing: Bed level Lower Body Bathing: Dependent Where Assessed-Lower Body Bathing: Bed level Upper Body Dressing: Maximal assistance Where Assessed-Upper Body Dressing: Bed level Lower Body Dressing: Dependent Where Assessed-Lower Body Dressing: Bed level Toileting: Dependent Where Assessed-Toileting: Bed level Toilet Transfer: Other (comment)(maximove bed to w/c today, unsafe at this time to transfer to commode) ADL Comments: significant fatigue with adl tasks Vision   Perception    Praxis   Exercises:   Other Treatments:     Therapy/Group: Individual Therapy  Tonny Branch 03/03/2019, 7:27 AM

## 2019-03-03 NOTE — Progress Notes (Signed)
[place] PHYSICAL MEDICINE & REHABILITATION PROGRESS NOTE   Subjective/Complaints: Patient seen sitting up in bed this morning.  She states she slept well overnight.  She asks for insulin so that she can eat her breakfast.  ROS: Denies CP, SOB, N/V/D  Objective:   No results found. No results for input(s): WBC, HGB, HCT, PLT in the last 72 hours. Recent Labs    03/01/19 0646  NA 142  K 3.8  CL 103  CO2 30  GLUCOSE 183*  BUN 13  CREATININE 1.09*  CALCIUM 9.8    Intake/Output Summary (Last 24 hours) at 03/03/2019 1118 Last data filed at 03/03/2019 [id number] Gross per 24 hour  Intake 640 ml  Output --  Net 640 ml     Physical Exam: Blood pressure (!) 101/59, pulse 99, temperature (!) 97.5 F (36.4 C), resp. rate 20, height 5\' 9"  (1.753 m), weight (!) 159.7 kg, last menstrual period 06/17/2012, SpO2 95 %. Constitutional: No distress . Vital signs reviewed. HENT: Normocephalic.  Facial abrasions healing Eyes: EOMI. No discharge. Cardiovascular: No JVD. Respiratory: Normal effort.  No stridor. GI: Non-distended. Skin: Warm and dry.  Intact. Psych: Normal mood.  Normal behavior. Musc: Lower extremity edema improving Neurological: Alert RUE: Shoulder abduction 2/5, distally 4 -/5  LUE: shoulder abduction 2/5, elbow flexion/extension 2+/5, hand grip 3/5 B/l LE: HF, KE 4-/5, ADF 4-/5  Assessment/Plan: 1. Functional deficits secondary to critical illness myopathy which require 3+ hours per day of interdisciplinary therapy in a comprehensive inpatient rehab setting.  Physiatrist is providing close team supervision and 24 hour management of active medical problems listed below.  Physiatrist and rehab team continue to assess barriers to discharge/monitor patient progress toward functional and medical goals  Care Tool:  Bathing    Body parts bathed by patient: Abdomen, Face, Chest, Right lower leg, Left lower leg, Right upper leg, Left upper leg   Body parts bathed by  helper: Right arm, Left arm, Front perineal area, Buttocks     Bathing assist Assist Level: Moderate Assistance - Patient 50 - 74%     Upper Body Dressing/Undressing Upper body dressing   What is the patient wearing?: Hospital gown only    Upper body assist Assist Level: Maximal Assistance - Patient 25 - 49%    Lower Body Dressing/Undressing Lower body dressing      What is the patient wearing?: Incontinence brief     Lower body assist Assist for lower body dressing: 2 Helpers     Toileting Toileting    Toileting assist Assist for toileting: Total Assistance - Patient < 25%     Transfers Chair/bed transfer  Transfers assist  Chair/bed transfer activity did not occur: Safety/medical concerns  Chair/bed transfer assist level: Supervision/Verbal cueing     Locomotion Ambulation   Ambulation assist   Ambulation activity did not occur: Safety/medical concerns  Assist level: Contact Guard/Touching assist Assistive device: Walker-rolling Max distance: 70'   Walk 10 feet activity   Assist  Walk 10 feet activity did not occur: Safety/medical concerns  Assist level: Supervision/Verbal cueing Assistive device: Walker-rolling   Walk 50 feet activity   Assist Walk 50 feet with 2 turns activity did not occur: Safety/medical concerns  Assist level: Contact Guard/Touching assist Assistive device: Walker-rolling    Walk 150 feet activity   Assist Walk 150 feet activity did not occur: Safety/medical concerns  Assist level: Contact Guard/Touching assist Assistive device: Walker-rolling    Walk 10 feet on uneven surface  activity  Assist Walk 10 feet on uneven surfaces activity did not occur: Safety/medical concerns         Wheelchair     Assist Will patient use wheelchair at discharge?: Yes(TBD) Type of Wheelchair: Manual(Per chart review LT goals set for dischrage )           Wheelchair 50 feet with 2 turns activity    Assist     Wheelchair 50 feet with 2 turns activity did not occur: Safety/medical concerns       Wheelchair 150 feet activity     Assist  Wheelchair 150 feet activity did not occur: Safety/medical concerns       Blood pressure (!) 101/59, pulse 99, temperature (!) 97.5 F (36.4 C), resp. rate 20, height 5\' 9"  (1.753 m), weight (!) 159.7 kg, last menstrual period 06/17/2012, SpO2 95 %.   Medical Problem List and Plan: 1.  Decreased functional mobility secondary to CIM due to acute hypoxemic respiratory failure due to ARDS/COVID-19 pneumonia.  Extubated 01/31/2019  Continue CIR  2.  Antithrombotics: -DVT/anticoagulation: Lovenox.  Venous Doppler studies negative             -antiplatelet therapy: Aspirin 81 mg daily 3. Pain Management: Tylenol as needed  Voltaren gel for L shoulder QID  Lidocaine patch for right shoulder.  Flexeril 5mg  TID prn for muscle tightness  Diclofenac 50 mg BID x 1 week  4. Mood: Provide emotional support             -antipsychotic agents: N/A 5. Neuropsych: This patient is capable of making decisions on her own behalf. 6. Skin/Wound Care: Routine skin checks 7. Fluids/Electrolytes/Nutrition: Routine in and outs.    Magnesium Oxide 400 mg BID as well as K+ replete   Potassium 3.8 on 2/18, labs ordered for Monday  Magnesium 1.7 on 2/10 8.  Acute on chronic diastolic congestive heart failure.  Echocardiogram ejection fraction 50%.  Monitor for any signs of fluid overload.  Continue Demadex 20 mg twice daily             Daily weights.   Filed Weights   02/26/19 0341 03/02/19 0418 03/03/19 0500  Weight: (!) 155.1 kg (!) 159.7 kg (!) 159.7 kg   Stable on 2/20, will continue to monitor 9.  Diabetes mellitus with peripheral neuropathy.  Hemoglobin A1c 7.1.    Check blood sugars before meals and at bedtime.  Diabetic teaching   CBG (last 3)  Recent Labs    03/03/19 0004 03/03/19 0354 03/03/19 0723  GLUCAP 151* 150* 185*   DM coordinator for insulin pump at  home  Started home Ozempic   Labile on 2/20, monitor for trend             Monitor with increased mobility 10.  Cardiogenic shock.  Weaned from pressors.  Monitor for orthostasis.    Decrease Coreg to 6.25 mg BID  11.  Super obesity.  BMI 54.50.  Dietary follow-up.  Encourage weight loss 12.  Hyperlipidemia.  Crestor 13.  History of anemia: Resolved  Continue iron supplement.    Hemoglobin 13.5 on 2/15  14.  Sickle cell trait.  Follow-up outpatient.  See #13 15. Constipation  Improved  16. L ear popping  Improved 17. Waist pressure ulcer  Continue wound care  LOS: 22 days A FACE TO FACE EVALUATION WAS PERFORMED  Aquila Delaughter Lorie Phenix 03/03/2019, 11:18 AM

## 2019-03-04 LAB — GLUCOSE, CAPILLARY
Glucose-Capillary: 147 mg/dL — ABNORMAL HIGH (ref 70–99)
Glucose-Capillary: 150 mg/dL — ABNORMAL HIGH (ref 70–99)
Glucose-Capillary: 154 mg/dL — ABNORMAL HIGH (ref 70–99)
Glucose-Capillary: 172 mg/dL — ABNORMAL HIGH (ref 70–99)
Glucose-Capillary: 175 mg/dL — ABNORMAL HIGH (ref 70–99)
Glucose-Capillary: 176 mg/dL — ABNORMAL HIGH (ref 70–99)
Glucose-Capillary: 214 mg/dL — ABNORMAL HIGH (ref 70–99)

## 2019-03-04 NOTE — Progress Notes (Signed)
[place] PHYSICAL MEDICINE & REHABILITATION PROGRESS NOTE   Subjective/Complaints: Patient seen sitting up in bed this morning.  She states she slept well overnight.  She asks to extend treatment for her oral diclofenac.  ROS: Denies CP, SOB, N/V/D  Objective:   No results found. No results for input(s): WBC, HGB, HCT, PLT in the last 72 hours. No results for input(s): NA, K, CL, CO2, GLUCOSE, BUN, CREATININE, CALCIUM in the last 72 hours.  Intake/Output Summary (Last 24 hours) at 03/04/2019 1156 Last data filed at 03/04/2019 0755 Gross per 24 hour  Intake 640 ml  Output --  Net 640 ml     Physical Exam: Blood pressure 99/62, pulse 90, temperature 98.4 F (36.9 C), resp. rate 17, height 5\' 9"  (1.753 m), weight (!) 160.1 kg, last menstrual period 06/17/2012, SpO2 100 %. Constitutional: No distress . Vital signs reviewed. HENT: Normocephalic.  Facial abrasions healing. Eyes: EOMI. No discharge. Cardiovascular: No JVD. Respiratory: Normal effort.  No stridor. GI: Non-distended. Skin: Warm and dry.  Intact. Psych: Normal mood.  Normal behavior. Musc: Lower extremity edema improving. Neurological: Alert RUE: Shoulder abduction 3 -/5, distally 3+/5  LUE: shoulder abduction 2+/5, elbow flexion/extension 2+/5, hand grip 3+/5 B/l LE: HF, KE 4-/5, ADF 4-/5  Assessment/Plan: 1. Functional deficits secondary to critical illness myopathy which require 3+ hours per day of interdisciplinary therapy in a comprehensive inpatient rehab setting.  Physiatrist is providing close team supervision and 24 hour management of active medical problems listed below.  Physiatrist and rehab team continue to assess barriers to discharge/monitor patient progress toward functional and medical goals  Care Tool:  Bathing    Body parts bathed by patient: Abdomen, Face, Chest, Right lower leg, Left lower leg, Right upper leg, Left upper leg   Body parts bathed by helper: Right arm, Left arm, Front  perineal area, Buttocks     Bathing assist Assist Level: Moderate Assistance - Patient 50 - 74%     Upper Body Dressing/Undressing Upper body dressing   What is the patient wearing?: Hospital gown only    Upper body assist Assist Level: Maximal Assistance - Patient 25 - 49%    Lower Body Dressing/Undressing Lower body dressing      What is the patient wearing?: Incontinence brief     Lower body assist Assist for lower body dressing: 2 Helpers     Toileting Toileting    Toileting assist Assist for toileting: Total Assistance - Patient < 25%     Transfers Chair/bed transfer  Transfers assist  Chair/bed transfer activity did not occur: Safety/medical concerns  Chair/bed transfer assist level: Contact Guard/Touching assist Chair/bed transfer assistive device: Armrests, Programmer, multimedia   Ambulation assist   Ambulation activity did not occur: Safety/medical concerns  Assist level: 2 helpers(CGA and +2 w/c follow) Assistive device: Walker-rolling Max distance: 267ft   Walk 10 feet activity   Assist  Walk 10 feet activity did not occur: Safety/medical concerns  Assist level: 2 helpers(CGA and +2 w/c follow) Assistive device: Walker-rolling   Walk 50 feet activity   Assist Walk 50 feet with 2 turns activity did not occur: Safety/medical concerns  Assist level: 2 helpers(CGA and +2 w/c follow) Assistive device: Walker-rolling    Walk 150 feet activity   Assist Walk 150 feet activity did not occur: Safety/medical concerns  Assist level: 2 helpers(CGA and +2 w/c follow) Assistive device: Walker-rolling    Walk 10 feet on uneven surface  activity   Assist Walk  10 feet on uneven surfaces activity did not occur: Safety/medical concerns         Wheelchair     Assist Will patient use wheelchair at discharge?: Yes(TBD) Type of Wheelchair: Manual(Per chart review LT goals set for dischrage )           Wheelchair 50 feet  with 2 turns activity    Assist    Wheelchair 50 feet with 2 turns activity did not occur: Safety/medical concerns       Wheelchair 150 feet activity     Assist  Wheelchair 150 feet activity did not occur: Safety/medical concerns       Blood pressure 99/62, pulse 90, temperature 98.4 F (36.9 C), resp. rate 17, height 5\' 9"  (1.753 m), weight (!) 160.1 kg, last menstrual period 06/17/2012, SpO2 100 %.   Medical Problem List and Plan: 1.  Decreased functional mobility secondary to CIM due to acute hypoxemic respiratory failure due to ARDS/COVID-19 pneumonia.  Extubated 01/31/2019  Continue CIR  2.  Antithrombotics: -DVT/anticoagulation: Lovenox.  Venous Doppler studies negative             -antiplatelet therapy: Aspirin 81 mg daily 3. Pain Management: Tylenol as needed  Voltaren gel for L shoulder QID  Lidocaine patch for right shoulder.  Flexeril 5mg  TID prn for muscle tightness  Continue diclofenac 50 mg BID x 1 week  4. Mood: Provide emotional support             -antipsychotic agents: N/A 5. Neuropsych: This patient is capable of making decisions on her own behalf. 6. Skin/Wound Care: Routine skin checks 7. Fluids/Electrolytes/Nutrition: Routine in and outs.    Magnesium Oxide 400 mg BID as well as K+ replete   Potassium 3.8 on 2/18, labs ordered for tomorrow  Magnesium 1.7 on 2/10 8.  Acute on chronic diastolic congestive heart failure.  Echocardiogram ejection fraction 50%.  Monitor for any signs of fluid overload.  Continue Demadex 20 mg twice daily             Daily weights.   Filed Weights   03/02/19 0418 03/03/19 0500 03/04/19 0300  Weight: (!) 159.7 kg (!) 159.7 kg (!) 160.1 kg   Stable on 2/21, will continue to monitor 9.  Diabetes mellitus with peripheral neuropathy.  Hemoglobin A1c 7.1.    Check blood sugars before meals and at bedtime.  Diabetic teaching   CBG (last 3)  Recent Labs    03/04/19 0016 03/04/19 0418 03/04/19 0745  GLUCAP 150* 154*  214*   Continue insulin pump  Started home Ozempic   Labile on 2/21             Monitor with increased mobility 10.  Cardiogenic shock.  Weaned from pressors.  Monitor for orthostasis.    Decrease Coreg to 6.25 mg BID  11.  Super obesity.  BMI 54.50.  Dietary follow-up.  Encourage weight loss 12.  Hyperlipidemia.  Crestor 13.  History of anemia: Resolved  Continue iron supplement.   14.  Sickle cell trait.  Follow-up outpatient.  See #13 15. Constipation  Improved  16. L ear popping  Improved 17. Waist pressure ulcer  Continue wound care  LOS: 23 days A FACE TO FACE EVALUATION WAS PERFORMED  Aimie Wagman Lorie Phenix 03/04/2019, 11:56 AM

## 2019-03-04 NOTE — Plan of Care (Signed)
  Problem: Consults Goal: RH GENERAL PATIENT EDUCATION Description: See Patient Education module for education specifics. Outcome: Progressing Goal: Skin Care Protocol Initiated - if Braden Score 18 or less Description: If consults are not indicated, leave blank or document N/A Outcome: Progressing   Problem: RH BLADDER ELIMINATION Goal: RH STG MANAGE BLADDER WITH ASSISTANCE Description: STG Manage Bladder With mod Assistance Outcome: Progressing   Problem: RH SKIN INTEGRITY Goal: RH STG SKIN FREE OF INFECTION/BREAKDOWN Description: Pt will be free of skin breakdown or pressure ulcers prior to DC with mod assist Outcome: Progressing Goal: RH STG MAINTAIN SKIN INTEGRITY WITH ASSISTANCE Description: STG Maintain Skin Integrity With mod Assistance. Outcome: Progressing Goal: RH STG ABLE TO PERFORM INCISION/WOUND CARE W/ASSISTANCE Description: STG Able To Perform Incision/Wound Care With mod Assistance. Outcome: Progressing   Problem: RH SAFETY Goal: RH STG ADHERE TO SAFETY PRECAUTIONS W/ASSISTANCE/DEVICE Description: STG Adhere to Safety Precautions With mod Assistance/Device. Outcome: Progressing

## 2019-03-04 NOTE — Progress Notes (Signed)
Alerted by tech during night pt pt has some skin breakdown under R breast fold. Is 1.0x0.5 cm pink and open. Area cleansed and protective barrier cream applied.

## 2019-03-05 ENCOUNTER — Inpatient Hospital Stay (HOSPITAL_COMMUNITY): Payer: Medicaid Other | Admitting: Physical Therapy

## 2019-03-05 ENCOUNTER — Inpatient Hospital Stay (HOSPITAL_COMMUNITY): Payer: Medicaid Other

## 2019-03-05 LAB — CBC WITH DIFFERENTIAL/PLATELET
Abs Immature Granulocytes: 0.01 10*3/uL (ref 0.00–0.07)
Basophils Absolute: 0 10*3/uL (ref 0.0–0.1)
Basophils Relative: 0 %
Eosinophils Absolute: 0 10*3/uL (ref 0.0–0.5)
Eosinophils Relative: 0 %
HCT: 36.8 % (ref 36.0–46.0)
Hemoglobin: 12.2 g/dL (ref 12.0–15.0)
Immature Granulocytes: 0 %
Lymphocytes Relative: 51 %
Lymphs Abs: 3.2 10*3/uL (ref 0.7–4.0)
MCH: 26.7 pg (ref 26.0–34.0)
MCHC: 33.2 g/dL (ref 30.0–36.0)
MCV: 80.5 fL (ref 80.0–100.0)
Monocytes Absolute: 0.5 10*3/uL (ref 0.1–1.0)
Monocytes Relative: 8 %
Neutro Abs: 2.6 10*3/uL (ref 1.7–7.7)
Neutrophils Relative %: 41 %
Platelets: 247 10*3/uL (ref 150–400)
RBC: 4.57 MIL/uL (ref 3.87–5.11)
RDW: 16.7 % — ABNORMAL HIGH (ref 11.5–15.5)
WBC: 6.3 10*3/uL (ref 4.0–10.5)
nRBC: 0 % (ref 0.0–0.2)

## 2019-03-05 LAB — COMPREHENSIVE METABOLIC PANEL
ALT: 32 U/L (ref 0–44)
AST: 28 U/L (ref 15–41)
Albumin: 3 g/dL — ABNORMAL LOW (ref 3.5–5.0)
Alkaline Phosphatase: 55 U/L (ref 38–126)
Anion gap: 12 (ref 5–15)
BUN: 17 mg/dL (ref 6–20)
CO2: 28 mmol/L (ref 22–32)
Calcium: 9.3 mg/dL (ref 8.9–10.3)
Chloride: 100 mmol/L (ref 98–111)
Creatinine, Ser: 0.96 mg/dL (ref 0.44–1.00)
GFR calc Af Amer: >60 mL/min (ref >60)
GFR calc non Af Amer: >60 mL/min (ref >60)
Glucose, Bld: 184 mg/dL — ABNORMAL HIGH (ref 70–99)
Potassium: 3.3 mmol/L — ABNORMAL LOW (ref 3.5–5.1)
Sodium: 140 mmol/L (ref 135–145)
Total Bilirubin: 0.4 mg/dL (ref 0.3–1.2)
Total Protein: 5.7 g/dL — ABNORMAL LOW (ref 6.5–8.1)

## 2019-03-05 LAB — GLUCOSE, CAPILLARY
Glucose-Capillary: 114 mg/dL — ABNORMAL HIGH (ref 70–99)
Glucose-Capillary: 127 mg/dL — ABNORMAL HIGH (ref 70–99)
Glucose-Capillary: 157 mg/dL — ABNORMAL HIGH (ref 70–99)
Glucose-Capillary: 163 mg/dL — ABNORMAL HIGH (ref 70–99)
Glucose-Capillary: 186 mg/dL — ABNORMAL HIGH (ref 70–99)
Glucose-Capillary: 98 mg/dL (ref 70–99)

## 2019-03-05 MED ORDER — POTASSIUM CHLORIDE CRYS ER 20 MEQ PO TBCR
40.0000 meq | EXTENDED_RELEASE_TABLET | Freq: Every day | ORAL | Status: DC
  Administered 2019-03-06 – 2019-03-10 (×5): 40 meq via ORAL
  Filled 2019-03-05 (×6): qty 2

## 2019-03-05 MED ORDER — DICLOFENAC SODIUM 50 MG PO TBEC
50.0000 mg | DELAYED_RELEASE_TABLET | Freq: Two times a day (BID) | ORAL | Status: DC | PRN
  Filled 2019-03-05: qty 1

## 2019-03-05 MED ORDER — DICLOFENAC SODIUM 25 MG PO TBEC
50.0000 mg | DELAYED_RELEASE_TABLET | Freq: Two times a day (BID) | ORAL | Status: DC | PRN
  Administered 2019-03-06 – 2019-03-10 (×6): 50 mg via ORAL
  Filled 2019-03-05 (×3): qty 2
  Filled 2019-03-05: qty 1
  Filled 2019-03-05 (×4): qty 2

## 2019-03-05 MED ORDER — POTASSIUM CHLORIDE CRYS ER 20 MEQ PO TBCR
40.0000 meq | EXTENDED_RELEASE_TABLET | Freq: Two times a day (BID) | ORAL | Status: AC
  Administered 2019-03-05 (×2): 40 meq via ORAL
  Filled 2019-03-05 (×2): qty 2

## 2019-03-05 NOTE — Progress Notes (Signed)
Physical Therapy Session Note  Patient Details  Name: Kaitlin [name] MRN: [id number] Date of Birth: September 30, 1965  Today's Date: 03/05/2019 PT Individual Time: 0830-0925 PT Individual Time Calculation (min): 55 min   Short Term Goals: Week 3:  PT Short Term Goal 1 (Week 3): Pt will ambulate x 100 ft with assist x 1 PT Short Term Goal 1 - Progress (Week 3): Met PT Short Term Goal 2 (Week 3): Pt will perform sit to stand transfer with assist x 1 consistently PT Short Term Goal 2 - Progress (Week 3): Met PT Short Term Goal 3 (Week 3): Pt will tolerate standing x 5 min while performing functional task PT Short Term Goal 3 - Progress (Week 3): Met  Skilled Therapeutic Interventions/Progress Updates:   Session focused on functional bed mobility in air bed on flat surface with use of rails (will d/c home with hospital bed), functional transfers with RW including toileting, and dressing/self care activities. Pt performs bed mobility at overall supervision level (safety due to air mattress) including rolling and supine <> sit. Pt able to perform short distance gait in and out of bathroom and around room ~ 15' with overall close supervision to [facility] for obstacle negotiation with RW. Pt performs toilet transfers with CGA for balance and PT provided total assist for hygiene for BM and pt prefer to perform self hygiene in supine (so returned back to bed with supervision) and performed at bed level. Working with OT for increasing independence with performing hygiene. Seated EOB pt able to don shirt with assist to pull over trunk due to decreased ROM and strength. Pt able to don pants seated EOB with assist once in standing to pull up completely but pt able to peform ~ 50%. CGA for sit <> stand and standing balance during activity. Transferred to w/c with supervision with RW and performed hygiene at sink independently. Pt requires overall seated rest breaks due to decreased endurance and standing tolerance but  demonstrating overall improvement with mobility and functional endurance. End of session transferred to recliner with all needs in reach.   Therapy Documentation Precautions:  Precautions Precautions: Fall Precaution Comments: morbid obesity, monitor SpO2 and HR Restrictions Weight Bearing Restrictions: No  Pain: No reports of pain.    Therapy/Group: Individual Therapy  Canary Brim Ivory Broad, PT, DPT, CBIS  03/05/2019, 9:30 AM

## 2019-03-05 NOTE — Progress Notes (Addendum)
Social Work Patient ID: Kaitlin [name], female   DOB: [birth year], 54 y.o.   MRN: [id number]   SW received phone call from pt dtr [name] who inquired about FMLA. She would like to know how long will she need to provide assistance to her mother. SW indicated will follow-up with team. She intends to be here tomorrow for family education.   SW met with pt in room to discuss [facility] preference. Pt has no preference. Pt confirms that she will d/c to her dtr [name]'s home. SW discussed recommended DME: w/c, TTB, RW, DABSC, and hospital bed. Pt informed on PCS services with [employer] and process. No questions/concerns reported at this time.   SW followed up with [name]) to discuss HHPT/OT referral. Will check with branch and follow-up.  *Branch declined referral.   [name], MSW, [place] Office: [phone] Cell: [phone] Fax: [phone]

## 2019-03-05 NOTE — Progress Notes (Signed)
Physical Therapy Session Note  Patient Details  Name: Kaitlin [name] MRN: [id number] Date of Birth: May 28, 1965  Today's Date: 03/05/2019 PT Individual Time: 1100-1145 PT Individual Time Calculation (min): 45 min  PT Missed Time: 15 min Missed Time Reason: patient illness (chest pain, elevated BP)  Short Term Goals: Week 3:  PT Short Term Goal 1 (Week 3): Pt will ambulate x 100 ft with assist x 1 PT Short Term Goal 1 - Progress (Week 3): Met PT Short Term Goal 2 (Week 3): Pt will perform sit to stand transfer with assist x 1 consistently PT Short Term Goal 2 - Progress (Week 3): Met PT Short Term Goal 3 (Week 3): Pt will tolerate standing x 5 min while performing functional task PT Short Term Goal 3 - Progress (Week 3): Met  Skilled Therapeutic Interventions/Progress Updates:  Pt received seated in recliner in room with her head in her arms resting on bedside table. Pt appears more fatigued this AM and reports feeling more fatigued than normal. Pt agreeable to attempt participation in therapy session. No complaints of pain. Sit to stand with CGA from recliner to RW. Ambulation x 10 ft to w/c with RW at Supervision level. Dependent transport via w/c to therapy gym per pt request due to fatigue. Demonstration of how to perform 4" curb step with RW. Attempt to have pt perform return demo of curb navigation but pt reports feeling very "off", requests BP reading. Seated BP 134/70, elevated from her norm per PT. Seated SpO2 98% and HR 103. Returned pt to her room via w/c and RN notified. Pt also reports onset of R side chest pain and feeling very "weak". Assessed seated BP again once RN present, 113/70. RN to notify PA of pt symptoms. Stand pivot transfer back to bed with RW and CGA. Sit to supine Supervision. Pt left semi-reclined in bed with needs in reach. Pt missed 15 min of scheduled therapy session due to symptoms of fatigue, weakness, chest pain, and elevated BP.  Therapy  Documentation Precautions:  Precautions Precautions: Fall Precaution Comments: morbid obesity, monitor SpO2 and HR Restrictions Weight Bearing Restrictions: No    Therapy/Group: Individual Therapy   Excell Seltzer, PT, DPT  03/05/2019, 11:48 AM

## 2019-03-05 NOTE — Progress Notes (Signed)
PHYSICAL MEDICINE & REHABILITATION PROGRESS NOTE   Subjective/Complaints:  Pt reports wants to con't Diclofenac for R shoulder- it's improving with swelling and pain- L still also slightly bothersome, however Diclofenac is making a big difference.   Also K+ down to 3.3- will replete and increase daily KCL dose.    ROS: denies SOB, CP, N/V/C/D  Objective:   No results found. Recent Labs    03/05/19 0531  WBC 6.3  HGB 12.2  HCT 36.8  PLT 247   Recent Labs    03/05/19 0531  NA 140  K 3.3*  CL 100  CO2 28  GLUCOSE 184*  BUN 17  CREATININE 0.96  CALCIUM 9.3    Intake/Output Summary (Last 24 hours) at 03/05/2019 [id number] Last data filed at 03/04/2019 1906 Gross per 24 hour  Intake 522 ml  Output -  Net 522 ml     Physical Exam: Blood pressure 131/84, pulse 96, temperature 98.4 F (36.9 C), temperature source Oral, resp. rate 18, height 5\' 9"  (1.753 m), weight (!) 160.1 kg, last menstrual period 06/17/2012, SpO2 95 %. Constitutional: sitting up in pressure relieving bariatric mattress; smiling; eating breakfast, NAD HENT: Normocephalic.  Facial abrasions almost healed- mainly lightened scarring on nose now Eyes: Conjugate gaze Cardiovascular: RRR Respiratory: CTA B/L GI: soft, NT, ND, (+)BS; protuberant Skin: Warm and dry.  Intact. Psych: brighter affect Musc: Lower extremity edema improving. R shoulder TTP over posterior shoulder and deltoid overall; less than last week; L posterior shoulder also TTP- but also better than last week.  Neurological: Alert RUE: Shoulder abduction 3 -/5, distally 3+/5  LUE: shoulder abduction 2+/5, elbow flexion/extension 2+/5, hand grip 3+/5 B/l LE: HF, KE 4-/5, ADF 4-/5  Assessment/Plan: 1. Functional deficits secondary to critical illness myopathy which require 3+ hours per day of interdisciplinary therapy in a comprehensive inpatient rehab setting.  Physiatrist is providing close team supervision and 24 hour management  of active medical problems listed below.  Physiatrist and rehab team continue to assess barriers to discharge/monitor patient progress toward functional and medical goals  Care Tool:  Bathing    Body parts bathed by patient: Abdomen, Face, Chest, Right lower leg, Left lower leg, Right upper leg, Left upper leg   Body parts bathed by helper: Right arm, Left arm, Front perineal area, Buttocks     Bathing assist Assist Level: Moderate Assistance - Patient 50 - 74%     Upper Body Dressing/Undressing Upper body dressing   What is the patient wearing?: Hospital gown only    Upper body assist Assist Level: Maximal Assistance - Patient 25 - 49%    Lower Body Dressing/Undressing Lower body dressing      What is the patient wearing?: Incontinence brief     Lower body assist Assist for lower body dressing: 2 Helpers     Toileting Toileting    Toileting assist Assist for toileting: Maximal Assistance - Patient 25 - 49%     Transfers Chair/bed transfer  Transfers assist  Chair/bed transfer activity did not occur: Safety/medical concerns  Chair/bed transfer assist level: Supervision/Verbal cueing Chair/bed transfer assistive device: Programmer, multimedia   Ambulation assist   Ambulation activity did not occur: Safety/medical concerns  Assist level: 2 helpers(CGA and +2 w/c follow) Assistive device: Walker-rolling Max distance: 284ft   Walk 10 feet activity   Assist  Walk 10 feet activity did not occur: Safety/medical concerns  Assist level: 2 helpers(CGA and +2 w/c follow) Assistive device: Walker-rolling  Walk 50 feet activity   Assist Walk 50 feet with 2 turns activity did not occur: Safety/medical concerns  Assist level: 2 helpers(CGA and +2 w/c follow) Assistive device: Walker-rolling    Walk 150 feet activity   Assist Walk 150 feet activity did not occur: Safety/medical concerns  Assist level: 2 helpers(CGA and +2 w/c  follow) Assistive device: Walker-rolling    Walk 10 feet on uneven surface  activity   Assist Walk 10 feet on uneven surfaces activity did not occur: Safety/medical concerns         Wheelchair     Assist Will patient use wheelchair at discharge?: Yes(TBD) Type of Wheelchair: Manual(Per chart review LT goals set for dischrage )           Wheelchair 50 feet with 2 turns activity    Assist    Wheelchair 50 feet with 2 turns activity did not occur: Safety/medical concerns       Wheelchair 150 feet activity     Assist  Wheelchair 150 feet activity did not occur: Safety/medical concerns       Blood pressure 131/84, pulse 96, temperature 98.4 F (36.9 C), temperature source Oral, resp. rate 18, height 5\' 9"  (1.753 m), weight (!) 160.1 kg, last menstrual period 06/17/2012, SpO2 95 %.   Medical Problem List and Plan: 1.  Decreased functional mobility secondary to CIM due to acute hypoxemic respiratory failure due to ARDS/COVID-19 pneumonia.  Extubated 01/31/2019  Continue CIR  2.  Antithrombotics: -DVT/anticoagulation: Lovenox.  Venous Doppler studies negative             -antiplatelet therapy: Aspirin 81 mg daily 3. Pain Management: Tylenol as needed  Voltaren gel for L shoulder QID  Lidocaine patch for right shoulder.  Flexeril 5mg  TID prn for muscle tightness  Continue diclofenac 50 mg BID x 1 week   2/22- kidney function looking great- will make diclofenac 50 mg BID PRN for pt.  4. Mood: Provide emotional support             -antipsychotic agents: N/A 5. Neuropsych: This patient is capable of making decisions on her own behalf. 6. Skin/Wound Care: Routine skin checks 7. Fluids/Electrolytes/Nutrition: Routine in and outs.    Magnesium Oxide 400 mg BID as well as K+ replete   Potassium 3.8 on 2/18, labs ordered for tomorrow  Magnesium 1.7 on 2/10  2/22- will replete KCL 40 mEq x2 and increase daily to 40 mEq and monitor 8.  Acute on chronic diastolic  congestive heart failure.  Echocardiogram ejection fraction 50%.  Monitor for any signs of fluid overload.  Continue Demadex 20 mg twice daily             Daily weights.   Filed Weights   03/02/19 0418 03/03/19 0500 03/04/19 0300  Weight: (!) 159.7 kg (!) 159.7 kg (!) 160.1 kg   Stable on 2/22- will con't to monitor ,esp. with diclofenac which can cause some fluid retention for some pts 9.  Diabetes mellitus with peripheral neuropathy.  Hemoglobin A1c 7.1.    Check blood sugars before meals and at bedtime.  Diabetic teaching   CBG (last 3)  Recent Labs    03/04/19 2358 03/05/19 0423 03/05/19 0747  GLUCAP 172* 157* 163*   Continue insulin pump  Started home Ozempic   2/22- mid 100s- much improved for this pt- con't dose for now.              Monitor with increased mobility 10.  Cardiogenic shock.  Weaned from pressors.  Monitor for orthostasis.    Decrease Coreg to 6.25 mg BID  11.  Super obesity.  BMI 54.50.  Dietary follow-up.  Encourage weight loss  2/22- BMI down to 52.13- has lost ~ 8+ lbs since admission con't to monitor/encourage weight loss.  12.  Hyperlipidemia.  Crestor 13.  History of anemia: Resolved  Continue iron supplement.   14.  Sickle cell trait.  Follow-up outpatient.  See #13 15. Constipation  Improved  16. L ear popping  Improved 17. Waist pressure ulcer  Continue wound care  LOS: 24 days A FACE TO FACE EVALUATION WAS PERFORMED  Chalese Peach 03/05/2019, 9:37 AM

## 2019-03-05 NOTE — Progress Notes (Signed)
Physical Therapy Weekly Progress Note  Patient Details  Name: Kaitlin [name] MRN: [id number] Date of Birth: [birth year]  Beginning of progress report period: February 26, 2019 End of progress report period: March 05, 2019  Today's Date: 03/05/2019  Patient has met 3 of 3 short term goals. Pt continues to make great progress towards LTG. She is currently at Supervision to min A level for bed mobility with use of hospital bed features, Supervision to CGA for sit to stand and SPT with heavy duty RW, and can ambulate up to 300 ft with RW and CGA. She remains limited at times by decreased endurance and fluctuations in BG but remains motivated and works hard towards rehab goals. Pt is on track to d/c home with family at the end of this week. One of the patient's daughters have completed family education and family edu is planned with the other one tomorrow.  Patient continues to demonstrate the following deficits muscle weakness, decreased cardiorespiratoy endurance and decreased standing balance, decreased postural control and decreased balance strategies and therefore will continue to benefit from skilled PT intervention to increase functional independence with mobility.  Patient progressing toward long term goals..  Continue plan of care. Goals upgraded last week due to great progress made towards LTG.  PT Short Term Goals Week 3:  PT Short Term Goal 1 (Week 3): Pt will ambulate x 100 ft with assist x 1 PT Short Term Goal 1 - Progress (Week 3): Met PT Short Term Goal 2 (Week 3): Pt will perform sit to stand transfer with assist x 1 consistently PT Short Term Goal 2 - Progress (Week 3): Met PT Short Term Goal 3 (Week 3): Pt will tolerate standing x 5 min while performing functional task PT Short Term Goal 3 - Progress (Week 3): Met Week 4:  PT Short Term Goal 1 (Week 4): =LTG due to ELOS      Therapy Documentation Precautions:  Precautions Precautions: Fall Precaution Comments: morbid  obesity, monitor SpO2 and HR Restrictions Weight Bearing Restrictions: No   Therapy/Group: Individual Therapy   Excell Seltzer, PT, DPT  03/05/2019, 8:17 AM

## 2019-03-05 NOTE — Progress Notes (Signed)
Occupational Therapy Session Note  Patient Details  Name: Kaitlin [name] MRN: [id number] Date of Birth: 07-11-1965  Today's Date: 03/05/2019 OT Individual Time: 1300-1415 OT Individual Time Calculation (min): 75 min    Short Term Goals: Week 3:  OT Short Term Goal 1 (Week 3): patient will complete UB bathing and dressing with mod A OT Short Term Goal 2 (Week 3): Pt will perform toilet tranfsers with mod A OT Short Term Goal 3 (Week 3): Pt will perform toleting tasks with max A OT Short Term Goal 4 (Week 3): Pt will perform UB dressing with mod A  Skilled Therapeutic Interventions/Progress Updates:    Pt resting in recliner upon arrival.  Pt states she "feels better" then earlier in day. BP 117/85 HR 95. Saebo applied and pt engaged in BUE table tasks and open chain activities with emphasis on increased functional use of BUE during BADLs. Focus on shoulder flexion and adduction.  Pt performs tasks with AROM and AAROM to increase AROM. Pt also engaged in activity grasping 1# bar and hitting beach ball back to therapist with emphasis on pushing LUE away from body. Pt requested to return to bed at end of session and completed sit>supine with supervision.  Pt able to reposition self using BLE to push up in bed.  Pt remained in bed with all needs within reach.    Saebo applied to R Deltoids for 30 mins during therapeutic activities Saebo Stim One 330 pulse width 35 Hz pulse rate On 8 sec/ off 8 sec Ramp up/ down 2 sec Symmetrical Biphasic wave form  Max intensity 172mA at 500 Ohm load   Therapy Documentation Precautions:  Precautions Precautions: Fall Precaution Comments: morbid obesity, monitor SpO2 and HR Restrictions Weight Bearing Restrictions: No   Pain:      Therapy/Group: Individual Therapy  Leroy Libman 03/05/2019, 2:21 PM

## 2019-03-06 ENCOUNTER — Ambulatory Visit (HOSPITAL_COMMUNITY): Payer: Medicaid Other | Admitting: Physical Therapy

## 2019-03-06 ENCOUNTER — Encounter (HOSPITAL_COMMUNITY): Payer: Medicaid Other

## 2019-03-06 ENCOUNTER — Inpatient Hospital Stay (HOSPITAL_COMMUNITY): Payer: Medicaid Other | Admitting: Physical Therapy

## 2019-03-06 ENCOUNTER — Inpatient Hospital Stay (HOSPITAL_COMMUNITY): Payer: Medicaid Other

## 2019-03-06 LAB — GLUCOSE, CAPILLARY
Glucose-Capillary: 140 mg/dL — ABNORMAL HIGH (ref 70–99)
Glucose-Capillary: 223 mg/dL — ABNORMAL HIGH (ref 70–99)
Glucose-Capillary: 177 mg/dL — ABNORMAL HIGH (ref 70–99)
Glucose-Capillary: 136 mg/dL — ABNORMAL HIGH (ref 70–99)
Glucose-Capillary: 211 mg/dL — ABNORMAL HIGH (ref 70–99)

## 2019-03-06 LAB — BASIC METABOLIC PANEL
Anion gap: 10 (ref 5–15)
BUN: 11 mg/dL (ref 6–20)
CO2: 30 mmol/L (ref 22–32)
Calcium: 9.7 mg/dL (ref 8.9–10.3)
Chloride: 103 mmol/L (ref 98–111)
Creatinine, Ser: 0.86 mg/dL (ref 0.44–1.00)
GFR calc Af Amer: >60 mL/min (ref >60)
GFR calc non Af Amer: >60 mL/min (ref >60)
Glucose, Bld: 155 mg/dL — ABNORMAL HIGH (ref 70–99)
Potassium: 3.9 mmol/L (ref 3.5–5.1)
Sodium: 143 mmol/L (ref 135–145)

## 2019-03-06 NOTE — Plan of Care (Signed)
  Problem: Consults Goal: RH GENERAL PATIENT EDUCATION Description: See Patient Education module for education specifics. Outcome: Progressing Goal: Skin Care Protocol Initiated - if Braden Score 18 or less Description: If consults are not indicated, leave blank or document N/A Outcome: Progressing   Problem: RH BLADDER ELIMINATION Goal: RH STG MANAGE BLADDER WITH ASSISTANCE Description: STG Manage Bladder With mod Assistance Outcome: Progressing   Problem: RH SKIN INTEGRITY Goal: RH STG SKIN FREE OF INFECTION/BREAKDOWN Description: Pt will be free of skin breakdown or pressure ulcers prior to DC with mod assist Outcome: Progressing Goal: RH STG MAINTAIN SKIN INTEGRITY WITH ASSISTANCE Description: STG Maintain Skin Integrity With mod Assistance. Outcome: Progressing Goal: RH STG ABLE TO PERFORM INCISION/WOUND CARE W/ASSISTANCE Description: STG Able To Perform Incision/Wound Care With total Assistance. Outcome: Progressing   Problem: RH SAFETY Goal: RH STG ADHERE TO SAFETY PRECAUTIONS W/ASSISTANCE/DEVICE Description: STG Adhere to Safety Precautions With mod Assistance/Device. Outcome: Progressing

## 2019-03-06 NOTE — Progress Notes (Signed)
Occupational Therapy Session Note  Patient Details  Name: Kaitlin [name] MRN: [id number] Date of Birth: 06-01-1965  Today's Date: 03/06/2019 OT Individual Time: [id number] OT Individual Time Calculation (min): 57 min    Short Term Goals: Week 3:  OT Short Term Goal 1 (Week 3): patient will complete UB bathing and dressing with mod A OT Short Term Goal 2 (Week 3): Pt will perform toilet tranfsers with mod A OT Short Term Goal 3 (Week 3): Pt will perform toleting tasks with max A OT Short Term Goal 4 (Week 3): Pt will perform UB dressing with mod A  Skilled Therapeutic Interventions/Progress Updates:    Pt resting in bed upon arrival. Pt requested use of toilet.  Supine>sit EOB with supervision.  Sit<>stand from EOB with supervision.  Pt amb with RW to bathroom with CGA.  Pt dependent for hygiene. Pt returned to room and completed bathing tasks seated in recliner at sink. Pt requires assistance with bathing BUE. Pt able to thread BLE into pants and initiated pulling pants over hips.  Pt required min A for pulling pants over hips.  Pt required mod A for donning shirt. Pt able to don shoes but required assistance to fasten. See Care Tool. Pt requires more then a reasonable amount of time to complete tasks. Pt remained seated in recliner with all needs within reach.   Therapy Documentation Precautions:  Precautions Precautions: Fall Precaution Comments: morbid obesity, monitor SpO2 and HR Restrictions Weight Bearing Restrictions: No  Pain: Pain Assessment Pain Scale: 0-10 Pain Score: 4  Pain Type: Acute pain Pain Location: Neck Pain Orientation: Left Pain Descriptors / Indicators: Aching Pain Onset: Gradual Pain Intervention(s):emotional support, soft tissue mobilizations   Therapy/Group: Individual Therapy  Leroy Libman 03/06/2019, 8:57 AM

## 2019-03-06 NOTE — Progress Notes (Signed)
Social Work Patient ID: Glennon Hamilton, female   DOB: 12/31/1965, 54 y.o.   MRN: [id number]    SW met with pt and pt dtr [name] in room to discuss discharge plan. Pt dtr intends to be available for pt. DME: TTB, RW, w/c, BSC delivered to pt room today. SW discussed PCS services.  Pt aware working finding a HHA. SW received FMLA forms from pt dtr. Pt dtr to sign portion required, and then provide to SW.   SW spoke with [phone]) to discuss HHPT/OT, possible CNA. Will follow up after discussing with branch.  [name], MSW, [place] Office: [phone] Cell: [phone] Fax: [phone]

## 2019-03-06 NOTE — Patient Care Conference (Signed)
Inpatient RehabilitationTeam Conference and Plan of Care Update Date: 03/06/2019   Time: 11:00 AM   Patient Name: Kaitlin [name]      Medical Record Number: [id number]  Date of Birth: April 27, 1965 Sex: Female         Room/Bed: 4W14C/4W14C-01 Payor Info: Payor: MEDICAID [place] / Plan: MEDICAID [place] ACCESS / Product Type: *No Product type* /    Admit Date/Time:  02/09/2019  2:25 PM  Primary Diagnosis:  Critical illness myopathy  Patient Active Problem List   Diagnosis Date Noted  . Sickle cell trait ([place])   . Labile blood glucose   . Diabetic peripheral neuropathy ([place])   . Hypokalemia   . Pain in joint of left shoulder   . Debility   . 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 COVID-19   . History of anemia   . Super obese   . Poorly controlled type 2 diabetes mellitus with peripheral neuropathy ([place])   . Acute on chronic diastolic (congestive) heart failure ([place])   . Hypernatremia   . Shock circulatory ([place])   . Septic shock ([place])   . Hypotension 01/13/2019  . Acute respiratory failure with hypoxemia ([place]) 01/13/2019  . Acute respiratory distress syndrome (ARDS) due to COVID-19 virus ([place]) 01/12/2019  . Grade I diastolic dysfunction [id number]  . 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 Dyspnea on exertion 05/19/2016  . Other fatigue 05/19/2016  . Absolute anemia 04/15/2016  . Left leg weakness 10/18/2014  . Left leg paresthesias 10/18/2014  . Acute left-sided weakness 10/18/2014  . Encounter for central line placement 03/08/2014  . Taking drug for chronic disease 03/08/2014  . H/O insertion of insulin pump 02/18/2014  . Long term current use of insulin ([place]) 02/18/2014  . Insulin pump in place 02/18/2014  . 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 Abnormal ballistocardiogram 05/18/2013  . Abnormal nuclear stress test 05/18/2013  . 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 NICM (nonischemic cardiomyopathy) ([place]) 05/01/2013  . Insulin dependent type 2 diabetes mellitus ([place])  04/10/2013  . Sex counseling 04/10/2013  . Edema leg 04/10/2013  . Upper respiratory infection, viral 04/10/2013  . Pedal edema 03/29/2013  . Pneumonia 12/12/2012  . Essential hypertension 12/12/2012  . HLD (hyperlipidemia) 12/12/2012  . Tonsillar mass 12/01/2012  . 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 Leukocytosis   . Anemia   . High cholesterol   . Hypertension   . Blurred vision 09/11/2011  . Foot pain 09/11/2011  . Breast pain 09/11/2011  . Morbid obesity ([place]) 09/11/2011  . Fungal infection of nail 09/11/2011  . Avitaminosis D 09/11/2011    Expected Discharge Date: Expected Discharge Date: 03/10/19  Team Members Present: Physician leading conference: Dr. Courtney Heys Social Worker Present: [name], [place]) Nurse Present: Dwaine Gale, RN Case Manager: Karene Fry, RN PT Present: Excell Seltzer, PT OT Present: Roanna Epley, COTA;Jennifer Tamala Julian, OT PPS Coordinator present : Gunnar Fusi, SLP     Current Status/Progress Goal Weekly Team Focus  Bowel/Bladder   continent of b/b; LBM: 02/22  Maintain continence  assist with toileting needs prn   Swallow/Nutrition/ Hydration             ADL's   bed mobility-min A; sit<>stand and transers-min A/CGA; bathing-mod A; LB dressing-max A; increased functional use of BUE  min A overall, mod A bathing  activity tolerance, BADL training, BUE funciton/strengthening, bed mobility   Mobility   S to min A bed mobility, S to CGA sit to stand to  RW, gait up to 300 ft with CGA  upgraded to S to min A overall  family education, d/c planning, endurance   Communication             Safety/Cognition/ Behavioral Observations            Pain   no c/o pain  remain pain free  assess pain QS and prn   Skin   blisters on black (healing), pressure injury on nose healing  continue plan of care for wounds per orders; remain free of new skin breakdown/infection  assess skin QS and prn    Rehab Goals Patient  on target to meet rehab goals: Yes *See Care Plan and progress notes for long and short-term goals.     Barriers to Discharge  Current Status/Progress Possible Resolutions Date Resolved   Nursing                  PT  Weight                 OT                  SLP                SW                Discharge Planning/Teaching Needs:  D/c home with her dtr.  Family education as recomended by therapy; Fam edu on 2/20 adm 2/23   Team Discussion: Post Covid, CIM, trouble with shoulders, K+ up, rechecking labs, monitoring weight, monitoring BS, healing ulcer on back at waist area.  RN cont, eating, drinking, L side breast fold pink.  PT S overall, standing S, amb 300', curb step CGA.  OT toileting hygiene dependent, bathing mod A, LB dressing max A, fam ed with Dtr today.  Needs 24/7 S initially until she can take care of her toileting functions.   Revisions to Treatment Plan: N/A     Medical Summary Current Status: continent of B/B; 2 areas on L side that are pink under breast - moisture related; voltaren this AM Weekly Focus/Goal: PT_ supervision for most mobility; 300 ft with RW; CGA for curbstep - on track- family training being done  Barriers to Discharge: Decreased family/caregiver support;Home enviroment access/layout;Wound care;Weight  Barriers to Discharge Comments: n/a Possible Resolutions to Barriers: OT- hygiene- dep. LB- dressing- almost up to over hips; needs helps with TEDs and shoes;   Continued Need for Acute Rehabilitation Level of Care: The patient requires daily medical management by a physician with specialized training in physical medicine and rehabilitation for the following reasons: Direction of a multidisciplinary physical rehabilitation program to maximize functional independence : Yes Medical management of patient stability for increased activity during participation in an intensive rehabilitation regime.: Yes Analysis of laboratory values and/or radiology  reports with any subsequent need for medication adjustment and/or medical intervention. : Yes   I attest that I was present, lead the team conference, and concur with the assessment and plan of the team.   Jodell Cipro M 03/06/2019, 8:17 PM   Team conference was held via web/ teleconference due to [place] - 19

## 2019-03-06 NOTE — Progress Notes (Signed)
Occupational Therapy Session Note  Patient Details  Name: Kaitlin [name] MRN: [id number] Date of Birth: October 24, 1965  Today's Date: 03/06/2019 OT Individual Time: 1300-1345 OT Individual Time Calculation (min): 45 min    Short Term Goals: Week 3:  OT Short Term Goal 1 (Week 3): patient will complete UB bathing and dressing with mod A OT Short Term Goal 2 (Week 3): Pt will perform toilet tranfsers with mod A OT Short Term Goal 3 (Week 3): Pt will perform toleting tasks with max A OT Short Term Goal 4 (Week 3): Pt will perform UB dressing with mod A  Skilled Therapeutic Interventions/Progress Updates:    Pt resting in recliner upon arrival.  Pt's daughter scheduled for education session but running late.  Reviewed with pt her current assist requirements.  Pt independent with requesting appropriate assistance. Demonstrated use of TTB. Pt's daughter arrived with approx 10 mins remaining in session.  Reviewed recommendation for 24 hour assistance/supervisoin.  Reviewed pt's current level of assistance and demonstrated TTB. Pt remained seated in recliner with daughter present.   Therapy Documentation Precautions:  Precautions Precautions: Fall Precaution Comments: morbid obesity, monitor SpO2 and HR Restrictions Weight Bearing Restrictions: No Pain:  Pt denies pain this afternoon   Therapy/Group: Individual Therapy  Leroy Libman 03/06/2019, 2:48 PM

## 2019-03-06 NOTE — Progress Notes (Signed)
[facility] PHYSICAL MEDICINE & REHABILITATION PROGRESS NOTE   Subjective/Complaints:  R shoulder bothering her more this AM; thinks "slept on it wrong".  Waiting to get Diclofenac- didn't have in Pyxis.   Working well with PT- has family education again today and d/c is Saturday.    ROS: denies SOB, CP, N/V/C/D, abd pain, mood changes, memory changes  Objective:   No results found. Recent Labs    03/05/19 0531  WBC 6.3  HGB 12.2  HCT 36.8  PLT 247   Recent Labs    03/05/19 0531 03/06/19 0546  NA 140 143  K 3.3* 3.9  CL 100 103  CO2 28 30  GLUCOSE 184* 155*  BUN 17 11  CREATININE 0.96 0.86  CALCIUM 9.3 9.7    Intake/Output Summary (Last 24 hours) at 03/06/2019 0916 Last data filed at 03/05/2019 1800 Gross per 24 hour  Intake 240 ml  Output -  Net 240 ml    Vitals and labs reviewed- K+ up to 3.9  Physical Exam: Blood pressure 110/68, pulse (!) 107, temperature 98.8 F (37.1 C), resp. rate 20, height 5\' 9"  (1.753 m), weight (!) 160.1 kg, last menstrual period 06/17/2012, SpO2 100 %. Constitutional: sitting up in manual w/c at sink, washing face, etc with OT in room, NAD HENT: Normocephalic.  Washing face; getting scabs on nose Eyes: Conjugate gaze Cardiovascular: RRR Respiratory:  CTA B/L no W.RR; good air movement GI: soft, protuberant, NT, ND< (+)BS Skin: wound in crease of waist on back- more superficial, smaller in length and width- granulating well; no drainage Psych: smiling, bright affect Musc: Lower extremity edema improving. More TTP over R posterior shoulder and over deltoid- better than last week but worse than yesterday.  Neurological: Alert RUE: Shoulder abduction 3 -/5, distally 3+/5  LUE: shoulder abduction 2+/5, elbow flexion/extension 2+/5, hand grip 3+/5 B/l LE: HF, KE 4-/5, ADF 4-/5  Assessment/Plan: 1. Functional deficits secondary to critical illness myopathy which require 3+ hours per day of interdisciplinary therapy in a  comprehensive inpatient rehab setting.  Physiatrist is providing close team supervision and 24 hour management of active medical problems listed below.  Physiatrist and rehab team continue to assess barriers to discharge/monitor patient progress toward functional and medical goals  Care Tool:  Bathing    Body parts bathed by patient: Abdomen, Face, Right upper leg, Left upper leg, Right lower leg, Left lower leg, Chest   Body parts bathed by helper: Right arm, Left arm, Front perineal area, Buttocks     Bathing assist Assist Level: Moderate Assistance - Patient 50 - 74%     Upper Body Dressing/Undressing Upper body dressing   What is the patient wearing?: Pull over shirt    Upper body assist Assist Level: Moderate Assistance - Patient 50 - 74%    Lower Body Dressing/Undressing Lower body dressing      What is the patient wearing?: Incontinence brief, Pants     Lower body assist Assist for lower body dressing: Moderate Assistance - Patient 50 - 74%     Toileting Toileting    Toileting assist Assist for toileting: Maximal Assistance - Patient 25 - 49%     Transfers Chair/bed transfer  Transfers assist  Chair/bed transfer activity did not occur: Safety/medical concerns  Chair/bed transfer assist level: Supervision/Verbal cueing Chair/bed transfer assistive device: Programmer, multimedia   Ambulation assist   Ambulation activity did not occur: Safety/medical concerns  Assist level: 2 helpers(CGA and +2 w/c follow) Assistive device:  Walker-rolling Max distance: 218ft   Walk 10 feet activity   Assist  Walk 10 feet activity did not occur: Safety/medical concerns  Assist level: 2 helpers(CGA and +2 w/c follow) Assistive device: Walker-rolling   Walk 50 feet activity   Assist Walk 50 feet with 2 turns activity did not occur: Safety/medical concerns  Assist level: 2 helpers(CGA and +2 w/c follow) Assistive device: Walker-rolling    Walk 150  feet activity   Assist Walk 150 feet activity did not occur: Safety/medical concerns  Assist level: 2 helpers(CGA and +2 w/c follow) Assistive device: Walker-rolling    Walk 10 feet on uneven surface  activity   Assist Walk 10 feet on uneven surfaces activity did not occur: Safety/medical concerns         Wheelchair     Assist Will patient use wheelchair at discharge?: Yes(TBD) Type of Wheelchair: Manual(Per chart review LT goals set for dischrage )           Wheelchair 50 feet with 2 turns activity    Assist    Wheelchair 50 feet with 2 turns activity did not occur: Safety/medical concerns       Wheelchair 150 feet activity     Assist  Wheelchair 150 feet activity did not occur: Safety/medical concerns       Blood pressure 110/68, pulse (!) 107, temperature 98.8 F (37.1 C), resp. rate 20, height 5\' 9"  (1.753 m), weight (!) 160.1 kg, last menstrual period 06/17/2012, SpO2 100 %.   Medical Problem List and Plan: 1.  Decreased functional mobility secondary to CIM due to acute hypoxemic respiratory failure due to ARDS/COVID-19 pneumonia.  Extubated 01/31/2019  Continue CIR  2.  Antithrombotics: -DVT/anticoagulation: Lovenox.  Venous Doppler studies negative             -antiplatelet therapy: Aspirin 81 mg daily 3. Pain Management: Tylenol as needed  Voltaren gel for L shoulder QID  Lidocaine patch for right shoulder.  Flexeril 5mg  TID prn for muscle tightness  Continue diclofenac 50 mg BID x 1 week   2/22- kidney function looking great- will make diclofenac 50 mg BID PRN for pt.   2/23- asking for diclofenac today due to pain- recommends 1x/day to reduce risk of kidney issues long term.  4. Mood: Provide emotional support             -antipsychotic agents: N/A 5. Neuropsych: This patient is capable of making decisions on her own behalf. 6. Skin/Wound Care: Routine skin checks 7. Fluids/Electrolytes/Nutrition: Routine in and outs.    Magnesium  Oxide 400 mg BID as well as K+ replete   Potassium 3.8 on 2/18, labs ordered for tomorrow  Magnesium 1.7 on 2/10  2/22- will replete KCL 40 mEq x2 and increase daily to 40 mEq and monitor  2/23- K+ up to 3.9- con't daily 40 mEq and recheck Thursday 8.  Acute on chronic diastolic congestive heart failure.  Echocardiogram ejection fraction 50%.  Monitor for any signs of fluid overload.  Continue Demadex 20 mg twice daily             Daily weights.   Filed Weights   03/03/19 0500 03/04/19 0300 03/06/19 0500  Weight: (!) 159.7 kg (!) 160.1 kg (!) 160.1 kg   Stable on 2/22- will con't to monitor ,esp. with diclofenac which can cause some fluid retention for some pts 9.  Diabetes mellitus with peripheral neuropathy.  Hemoglobin A1c 7.1.    Check blood sugars before meals and at  bedtime.  Diabetic teaching   CBG (last 3)  Recent Labs    03/05/19 2353 03/06/19 0413 03/06/19 0745  GLUCAP 114* 140* 223*   Continue insulin pump  Started home Ozempic   2/22- mid 100s- much improved for this pt- con't dose for now.   2/23- 114-223- still overall better controlled for pt. con't regimen             Monitor with increased mobility 10.  Cardiogenic shock.  Weaned from pressors.  Monitor for orthostasis.    Decrease Coreg to 6.25 mg BID  11.  Super obesity.  BMI 54.50.  Dietary follow-up.  Encourage weight loss  2/22- BMI down to 52.13- has lost ~ 8+ lbs since admission con't to monitor/encourage weight loss.  12.  Hyperlipidemia.  Crestor 13.  History of anemia: Resolved  Continue iron supplement.   14.  Sickle cell trait.  Follow-up outpatient.  See #13 15. Constipation  Improved  16. L ear popping  Improved 17. Posterior Waist pressure ulcer  2/23- con't wound care in healing.improving ulcer  LOS: 25 days A FACE TO FACE EVALUATION WAS PERFORMED  Tawanda Schall 03/06/2019, 9:16 AM

## 2019-03-06 NOTE — Progress Notes (Signed)
Physical Therapy Session Note  Patient Details  Name: Kaitlin [name] MRN: [id number] Date of Birth: [birth year]  Today's Date: 03/06/2019 PT Individual Time: 1000-1100; 1345-1435 PT Individual Time Calculation (min): 60 min and 50 min  Short Term Goals: Week 4:  PT Short Term Goal 1 (Week 4): =LTG due to ELOS  Skilled Therapeutic Interventions/Progress Updates:    Session 1: Pt received seated in recliner in room, agreeable to PT session. Pt reports soreness in shoulders improving, not currently rated. Sit to stand with Supervision from recliner to RW. Stand pivot transfer recliner to w/c with RW and Supervision. Demonstration of how to navigate curb step safely with RW. Pt is able to ascend/descend one 4" curb with RW and CGA and then progress to 6" curb with RW and CGA. Static standing balance and standing tolerance x 1 min while performing horseshoe toss x 3 rounds of 8 horseshoes with one UE support on RW; ball toss 3 x 15 reps with no UE support. Ambulation x 242 ft with RW and Supervision. Pt requires frequent rest breaks during session due to decreased endurance but exhibits improved overall tolerance for therapy this date. Pt left seated in recliner in room with needs in reach at end of session.  Session 2: Pt received seated in recliner in room with daughter [name] present for hands on family education session. Pt is at Supervision level for sit to stand transfers to RW and SPT recliner to bed with RW. Pt is able to perform bed mobility at Supervision level with use of hospital bed features including lifting HOB and use of bedrails. Pt is able to demonstrate how to safely back up to a car to sit down with RW at Supervision level. Pt unable to get BLE into simulation car due to decreased space for BLE in/out of car. Demonstrated how pt performs RW management when navigating a curb. Pt's daughter demonstrates good understanding of current assist level that pt requires. Also reviewed equipment  management including RW and w/c setup. Discussed pt's difficulty with standing from lower surfaces and that she requires an elevated bed to stand from or w/c cushion placed under her. Also discussed use of furniture risers to increase height of furniture she is sitting on. Pt left semi-reclined in bed with needs in reach at end of session.  Therapy Documentation Precautions:  Precautions Precautions: Fall Precaution Comments: morbid obesity, monitor SpO2 and HR Restrictions Weight Bearing Restrictions: No    Therapy/Group: Individual Therapy   Excell Seltzer, PT, DPT  03/06/2019, 12:41 PM

## 2019-03-07 ENCOUNTER — Inpatient Hospital Stay (HOSPITAL_COMMUNITY): Payer: Medicaid Other

## 2019-03-07 ENCOUNTER — Inpatient Hospital Stay (HOSPITAL_COMMUNITY): Payer: Medicaid Other | Admitting: Physical Therapy

## 2019-03-07 LAB — GLUCOSE, CAPILLARY
Glucose-Capillary: 124 mg/dL — ABNORMAL HIGH (ref 70–99)
Glucose-Capillary: 158 mg/dL — ABNORMAL HIGH (ref 70–99)
Glucose-Capillary: 161 mg/dL — ABNORMAL HIGH (ref 70–99)
Glucose-Capillary: 169 mg/dL — ABNORMAL HIGH (ref 70–99)
Glucose-Capillary: 169 mg/dL — ABNORMAL HIGH (ref 70–99)
Glucose-Capillary: 176 mg/dL — ABNORMAL HIGH (ref 70–99)
Glucose-Capillary: 232 mg/dL — ABNORMAL HIGH (ref 70–99)

## 2019-03-07 NOTE — Progress Notes (Signed)
Occupational Therapy Session Note  Patient Details  Name: Kaitlin [name] MRN: [id number] Date of Birth: 03-26-1965  Today's Date: 03/07/2019 OT Individual Time: 1100-1145 OT Individual Time Calculation (min): 45 min  and Today's Date: 03/07/2019 OT Missed Time: 15 Minutes Missed Time Reason: Other (comment)(nausea)   Short Term Goals: Week 3:  OT Short Term Goal 1 (Week 3): patient will complete UB bathing and dressing with mod A OT Short Term Goal 2 (Week 3): Pt will perform toilet tranfsers with mod A OT Short Term Goal 3 (Week 3): Pt will perform toleting tasks with max A OT Short Term Goal 4 (Week 3): Pt will perform UB dressing with mod A  Skilled Therapeutic Interventions/Progress Updates:    Pt resting in w/c upon arrival.  OT intervention with focus on standing balance, funcitonal amb with RW, BUE funciton, and activity tolerance to increase independence with BADLs. Pt engaged in standing to participate in [place]. Pt provided reacher and reacher bag to facilitate walking to pick up bean bags from floor before returning to w/c.  Pt repeated activity after rest in w/c.  Pt completed all tasks with CGA.  Recreational Therapist joined session and pt tossed smaller green ball with pt.  Pt stood for approx 1 minute and c/o nausea.  Pt returned to room and amb with RW to recliner.  NT entered to check CBG - 161. Pt remained in recliner with NT present.  Pt missed 15 mins skilled OT services secondary to nausea.   Therapy Documentation Precautions:  Precautions Precautions: Fall Precaution Comments: morbid obesity, monitor SpO2 and HR Restrictions Weight Bearing Restrictions: No General: General OT Amount of Missed Time: 15 Minutes   Pain: Pain Assessment Pain Scale: 0-10 Pain Score: 0-No pain    Therapy/Group: Individual Therapy  Leroy Libman 03/07/2019, 11:51 AM

## 2019-03-07 NOTE — Progress Notes (Signed)
[facility] PHYSICAL MEDICINE & REHABILITATION PROGRESS NOTE   Subjective/Complaints:  Pt reports R>L shoulder is still bothering her- but less than before- making progress.   Asking for w/c handicapped placard for vehicle.    ROS: denies SOB, CP, abd pain, N/V/C/D  Objective:   No results found. Recent Labs    03/05/19 0531  WBC 6.3  HGB 12.2  HCT 36.8  PLT 247   Recent Labs    03/05/19 0531 03/06/19 0546  NA 140 143  K 3.3* 3.9  CL 100 103  CO2 28 30  GLUCOSE 184* 155*  BUN 17 11  CREATININE 0.96 0.86  CALCIUM 9.3 9.7    Intake/Output Summary (Last 24 hours) at 03/07/2019 1037 Last data filed at 03/06/2019 1900 Gross per 24 hour  Intake 540 ml  Output -  Net 540 ml    Vitals and labs reviewed- K+ up to 3.9  Physical Exam: Blood pressure 113/67, pulse 92, temperature 98.9 F (37.2 C), temperature source Oral, resp. rate 18, height 5\' 9"  (1.753 m), weight (!) 160.5 kg, last menstrual period 06/17/2012, SpO2 100 %. Constitutional: sitting up in bariatric pressure relieving mattress; eating breakfast, NAD HENT: Normocephalic. Scars slowly healing on face/nose- lighter color  Eyes: Conjugate gaze Cardiovascular: RRR, no M/R/G Respiratory: CTA B/L- no W/R/R GI: soft, NT, ND, hyperactive BS while eating Skin: wound in crease of waist on back- more superficial, smaller in length and width- granulating well; no drainage Psych: bright affect Musc: Lower extremity edema improving. Still TTP over R>>L posterior shoulder, but for first time, didn't wince when palpated Neurological: Alert RUE: Shoulder abduction 3 -/5, distally 3+/5  LUE: shoulder abduction 2+/5, elbow flexion/extension 2+/5, hand grip 3+/5 B/l LE: HF, KE 4-/5, ADF 4-/5  Assessment/Plan: 1. Functional deficits secondary to critical illness myopathy which require 3+ hours per day of interdisciplinary therapy in a comprehensive inpatient rehab setting.  Physiatrist is providing close team  supervision and 24 hour management of active medical problems listed below.  Physiatrist and rehab team continue to assess barriers to discharge/monitor patient progress toward functional and medical goals  Care Tool:  Bathing    Body parts bathed by patient: Abdomen, Face, Right upper leg, Left upper leg, Right lower leg, Left lower leg, Chest   Body parts bathed by helper: Right arm, Left arm, Front perineal area, Buttocks     Bathing assist Assist Level: Moderate Assistance - Patient 50 - 74%     Upper Body Dressing/Undressing Upper body dressing   What is the patient wearing?: Pull over shirt    Upper body assist Assist Level: Moderate Assistance - Patient 50 - 74%    Lower Body Dressing/Undressing Lower body dressing      What is the patient wearing?: Pants     Lower body assist Assist for lower body dressing: Minimal Assistance - Patient > 75%     Toileting Toileting    Toileting assist Assist for toileting: Maximal Assistance - Patient 25 - 49%     Transfers Chair/bed transfer  Transfers assist  Chair/bed transfer activity did not occur: Safety/medical concerns  Chair/bed transfer assist level: Supervision/Verbal cueing Chair/bed transfer assistive device: Programmer, multimedia   Ambulation assist   Ambulation activity did not occur: Safety/medical concerns  Assist level: Supervision/Verbal cueing Assistive device: Walker-rolling Max distance: 242'   Walk 10 feet activity   Assist  Walk 10 feet activity did not occur: Safety/medical concerns  Assist level: Supervision/Verbal cueing Assistive device: Walker-rolling  Walk 50 feet activity   Assist Walk 50 feet with 2 turns activity did not occur: Safety/medical concerns  Assist level: Supervision/Verbal cueing Assistive device: Walker-rolling    Walk 150 feet activity   Assist Walk 150 feet activity did not occur: Safety/medical concerns  Assist level: Supervision/Verbal  cueing Assistive device: Walker-rolling    Walk 10 feet on uneven surface  activity   Assist Walk 10 feet on uneven surfaces activity did not occur: Safety/medical concerns         Wheelchair     Assist Will patient use wheelchair at discharge?: Yes(TBD) Type of Wheelchair: Manual(Per chart review LT goals set for dischrage )           Wheelchair 50 feet with 2 turns activity    Assist    Wheelchair 50 feet with 2 turns activity did not occur: Safety/medical concerns       Wheelchair 150 feet activity     Assist  Wheelchair 150 feet activity did not occur: Safety/medical concerns       Blood pressure 113/67, pulse 92, temperature 98.9 F (37.2 C), temperature source Oral, resp. rate 18, height 5\' 9"  (1.753 m), weight (!) 160.5 kg, last menstrual period 06/17/2012, SpO2 100 %.   Medical Problem List and Plan: 1.  Decreased functional mobility secondary to CIM due to acute hypoxemic respiratory failure due to ARDS/COVID-19 pneumonia.  Extubated 01/31/2019  Continue CIR  2.  Antithrombotics: -DVT/anticoagulation: Lovenox.  Venous Doppler studies negative             -antiplatelet therapy: Aspirin 81 mg daily 3. Pain Management: Tylenol as needed  Voltaren gel for L shoulder QID  Lidocaine patch for right shoulder.  Flexeril 5mg  TID prn for muscle tightness  Continue diclofenac 50 mg BID x 1 week   2/22- kidney function looking great- will make diclofenac 50 mg BID PRN for pt.   2/23- asking for diclofenac today due to pain- recommends 1x/day to reduce risk of kidney issues long term.  4. Mood: Provide emotional support             -antipsychotic agents: N/A 5. Neuropsych: This patient is capable of making decisions on her own behalf. 6. Skin/Wound Care: Routine skin checks 7. Fluids/Electrolytes/Nutrition: Routine in and outs.    Magnesium Oxide 400 mg BID as well as K+ replete   Potassium 3.8 on 2/18, labs ordered for tomorrow  Magnesium 1.7 on  2/10  2/22- will replete KCL 40 mEq x2 and increase daily to 40 mEq and monitor  2/23- K+ up to 3.9- con't daily 40 mEq and recheck Thursday  2/24- check labs tomorrow- also note since on Diclofenac, monitor kidney function.  8.  Acute on chronic diastolic congestive heart failure.  Echocardiogram ejection fraction 50%.  Monitor for any signs of fluid overload.  Continue Demadex 20 mg twice daily             Daily weights.   Filed Weights   03/04/19 0300 03/06/19 0500 03/07/19 0419  Weight: (!) 160.1 kg (!) 160.1 kg (!) 160.5 kg   Stable on 2/22- will con't to monitor ,esp. with diclofenac which can cause some fluid retention for some pts  2/24- weight basically stable at 160.5 kg 9.  Diabetes mellitus with peripheral neuropathy.  Hemoglobin A1c 7.1.    Check blood sugars before meals and at bedtime.  Diabetic teaching   CBG (last 3)  Recent Labs    03/07/19 0016 03/07/19 0417 03/07/19 [id number]  GLUCAP 124* 158* 232*   Continue insulin pump  Started home Ozempic   2/22- mid 100s- much improved for this pt- con't dose for now.   2/24- 124- 223- MUCH improved- con't regimen             Monitor with increased mobility 10.  Cardiogenic shock.  Weaned from pressors.  Monitor for orthostasis.    Decrease Coreg to 6.25 mg BID  11.  Super obesity.  BMI 54.50.  Dietary follow-up.  Encourage weight loss  2/22- BMI down to 52.13- has lost ~ 8+ lbs since admission con't to monitor/encourage weight loss.  12.  Hyperlipidemia.  Crestor 13.  History of anemia: Resolved  Continue iron supplement.   14.  Sickle cell trait.  Follow-up outpatient.  See #13 15. Constipation  Improved  16. L ear popping  Improved 17. Posterior Waist pressure ulcer  2/23- con't wound care in healing.improving ulcer 18. Dispo  2/24- will get pt handicapped placard before d/c on Saturday  LOS: 26 days A FACE TO FACE EVALUATION WAS PERFORMED  Mikle Sternberg 03/07/2019, 10:37 AM

## 2019-03-07 NOTE — Progress Notes (Signed)
Recreational Therapy Session Note  Patient Details  Name: Kaitlin [name] MRN: [id number] Date of Birth: 1965/07/11 Today's Date: 03/07/2019 Time:  11:30-1145 Pain: no c/o Skilled Therapeutic Interventions/Progress Updates: Session focused on activity tolerance, dynamic standing balance during co-treat with OT.  Pt standing playing cornhole with OT upon arrival.  Pt standing retrieving bean bags form a low surface (pt's thigh height) to toss onto playing board with contact guard assist.  Pt ambulated with RW to retrieve beanbags from the floor with reacher with contact guard assist.  After seated rest break, pt stood to toss a small rubber ball with contact guard assist ~ 1 minute before reporting feeling nauseous.  Pt took seated rest break and then returned back to the room via w/c.  Therapy/Group: Co-Treatment   Rudolph Daoust 03/07/2019, 12:41 PM

## 2019-03-07 NOTE — Progress Notes (Signed)
Social Work Patient ID: Glennon Hamilton, female   DOB: 1965-08-21, 54 y.o.   MRN: [id number]   SW returned phone call to Daine Floras [facility] [id number]) to report referral accepted for HHPT/OT. SW waiting on follow-up if CNA will be added, and the number of sessions pt will get with Medicaid. SW confirmed d/c date 2/27.  SW faxed FMLA forms to [employer] [phone]). Pt provided FMLA forms, and fax confirmation sheet. Pt also provided form for handicap placard.   [name], MSW, [place] Office: [phone] Cell: [phone] Fax: [phone]

## 2019-03-07 NOTE — Progress Notes (Signed)
Occupational Therapy Session Note  Patient Details  Name: Kaitlin [name] MRN: [id number] Date of Birth: [birth year]  Today's Date: 03/07/2019 OT Individual Time: 0900-1000 OT Individual Time Calculation (min): 60 min    Short Term Goals: Week 3:  OT Short Term Goal 1 (Week 3): patient will complete UB bathing and dressing with mod A OT Short Term Goal 2 (Week 3): Pt will perform toilet tranfsers with mod A OT Short Term Goal 3 (Week 3): Pt will perform toleting tasks with max A OT Short Term Goal 4 (Week 3): Pt will perform UB dressing with mod A  Skilled Therapeutic Interventions/Progress Updates:    Pt seated EOB upon arrival and ready for therapy. Saebo placed on R deltoids and remained placed for 60 mins. Saebo Stim One 330 pulse width 35 Hz pulse rate On 8 sec/ off 8 sec Ramp up/ down 2 sec Symmetrical Biphasic wave form  Max intensity 143mA at 500 Ohm load  Pt amb to w/c at sink and washed face and brushed her teeth while seated. Pt stated nursing had helped her bathe earlier and she just needed to get dressed.  Pt required assistance placing shirt over head but able to thread BUE into shirt sleeves. Pt threaded BLE into pants and initiated pulling over hips.  Pt required assistance to pull pants over hips. Pt requires more than a reasonable amount of time to complete tasks with more than a reasonable amount of time to complete tasks. Pt reamined n w/c with all needs within reach.  Saebo removed after 60 mins.    Therapy Documentation Precautions:  Precautions Precautions: Fall Precaution Comments: morbid obesity, monitor SpO2 and HR Restrictions Weight Bearing Restrictions: No    Pain: Pain Assessment Pain Scale: 0-10 Pain Score: 0-No pain   Therapy/Group: Individual Therapy  Leroy Libman 03/07/2019, 10:01 AM

## 2019-03-07 NOTE — Progress Notes (Signed)
Physical Therapy Session Note  Patient Details  Name: Kaitlin [name] MRN: [id number] Date of Birth: 11-Dec-1965  Today's Date: 03/07/2019 PT Individual Time: 1300-1415 PT Individual Time Calculation (min): 75 min   Short Term Goals: Week 4:  PT Short Term Goal 1 (Week 4): =LTG due to ELOS  Skilled Therapeutic Interventions/Progress Updates:    Pt received seated in recliner in room, agreeable to PT session. No complaints of pain. Pt reports urge to use the bathroom. Stand pivot transfer recliner to [facility] with RW at Supervision level. Pt is dependent for clothing management for time conservation. Pt is able to continently void once seated on BSC, see Flowsheet for details. Pt is able to stand with Supervision and RW for dependent pericare due to body habitus. Stand pivot transfer to w/c with RW and Supervision. Set pt with w/c she will d/c home with and adjusted leg rests for improved pt comfort and fit. Pt is able to perform sit to stand safely from w/c seat height with cushion in place with Supervision to RW. Manual w/c propulsion x 100 ft with use of BUE at Supervision level with increased time needed. Pt exhibits improved ability to perform w/c mobility with additional use of BLE. Ambulation x 298 ft, x 340 ft with RW at Supervision level. Pt exhibits good gait speed and gait pattern as well as improving tolerance for upright mobility. Pt requests to return to bed at end of session. Sit to supine Supervision with use of bedrail. Pt left semi-reclined in bed with needs in reach at end of session. Cotreatment session with recreational therapy.  Therapy Documentation Precautions:  Precautions Precautions: Fall Precaution Comments: morbid obesity, monitor SpO2 and HR Restrictions Weight Bearing Restrictions: No    Therapy/Group: Individual Therapy   Excell Seltzer, PT, DPT  03/07/2019, 3:42 PM

## 2019-03-07 NOTE — Plan of Care (Signed)
  Problem: Consults Goal: RH GENERAL PATIENT EDUCATION Description: See Patient Education module for education specifics. Outcome: Progressing Goal: Skin Care Protocol Initiated - if Braden Score 18 or less Description: If consults are not indicated, leave blank or document N/A Outcome: Progressing   Problem: RH BLADDER ELIMINATION Goal: RH STG MANAGE BLADDER WITH ASSISTANCE Description: STG Manage Bladder With mod Assistance Outcome: Progressing   Problem: RH SKIN INTEGRITY Goal: RH STG SKIN FREE OF INFECTION/BREAKDOWN Description: Pt will be free of skin breakdown or pressure ulcers prior to DC with mod assist Outcome: Progressing Goal: RH STG MAINTAIN SKIN INTEGRITY WITH ASSISTANCE Description: STG Maintain Skin Integrity With mod Assistance. Outcome: Progressing Goal: RH STG ABLE TO PERFORM INCISION/WOUND CARE W/ASSISTANCE Description: STG Able To Perform Incision/Wound Care With total Assistance. Outcome: Progressing   Problem: RH SAFETY Goal: RH STG ADHERE TO SAFETY PRECAUTIONS W/ASSISTANCE/DEVICE Description: STG Adhere to Safety Precautions With mod Assistance/Device. Outcome: Progressing

## 2019-03-08 ENCOUNTER — Inpatient Hospital Stay (HOSPITAL_COMMUNITY): Payer: Medicaid Other | Admitting: Physical Therapy

## 2019-03-08 ENCOUNTER — Inpatient Hospital Stay (HOSPITAL_COMMUNITY): Payer: Medicaid Other

## 2019-03-08 ENCOUNTER — Inpatient Hospital Stay (HOSPITAL_COMMUNITY): Payer: Medicaid Other | Admitting: *Deleted

## 2019-03-08 LAB — GLUCOSE, CAPILLARY
Glucose-Capillary: 139 mg/dL — ABNORMAL HIGH (ref 70–99)
Glucose-Capillary: 240 mg/dL — ABNORMAL HIGH (ref 70–99)
Glucose-Capillary: 170 mg/dL — ABNORMAL HIGH (ref 70–99)
Glucose-Capillary: 135 mg/dL — ABNORMAL HIGH (ref 70–99)
Glucose-Capillary: 182 mg/dL — ABNORMAL HIGH (ref 70–99)

## 2019-03-08 NOTE — Discharge Instructions (Signed)
Inpatient Rehab Discharge Instructions  [facility] DARRIN Discharge date and time: No discharge date for patient encounter.   Activities/Precautions/ Functional Status: Activity: activity as tolerated Diet: diabetic diet Wound Care: keep wound clean and dry Functional status:  ___ No restrictions     ___ Walk up steps independently ___ 24/7 supervision/assistance   ___ Walk up steps with assistance ___ Intermittent supervision/assistance  ___ Bathe/dress independently ___ Walk with walker     _x__ Bathe/dress with assistance ___ Walk Independently    ___ Shower independently ___ Walk with assistance    ___ Shower with assistance ___ No alcohol     ___ Return to work/school ________   COMMUNITY REFERRALS UPON DISCHARGE:    Home Health:   PT    OT       SNA                Agency:[facility] Phone: [phone]    Medical Equipment/Items Ordered: semi-electric hospital bed, wheelchair, tube transfer bench, 3in1 bedside commode, rolling walker                                                 Agency/Supplier: [facility] [phone]  GENERAL COMMUNITY RESOURCES FOR PATIENT/FAMILY: Referral submitted to [employer] for [employer] [phone]. Referral approved. Current approved hours: 60hrs. A referral will be sent to your preferred locations in order of preference to see if they have an aide to provide care : 1) [facility], 2) [facility], and 3) [facility]. Please expect a follow-up to schedule a nursing assessment. This assessment will determine the number of hours you will receive. Please contact [facility] to check the status if any further questions/concerns.     Special Instructions: No driving smoking or alcohol   My questions have been answered and I understand these instructions. I will adhere to these goals and the provided educational materials after my discharge from the  hospital.  Patient/Caregiver Signature _______________________________ Date __________  Clinician Signature _______________________________________ Date __________  Please bring this form and your medication list with you to all your follow-up doctor's appointments.

## 2019-03-08 NOTE — Progress Notes (Signed)
[facility] PHYSICAL MEDICINE & REHABILITATION PROGRESS NOTE   Subjective/Complaints:  Settings mainly on max in insulin pump, but BGs still aren't the level they were at home- said there's 1 more change on insulin pump she can do- asked her to do so.   R shoulder doing better. Not resolved, but better    ROS: Denies SOB, CP, abd pain; N/V/C/D going daily; R>L shoulder still hurting some- but progressing.   Objective:   No results found. No results for input(s): WBC, HGB, HCT, PLT in the last 72 hours. Recent Labs    03/06/19 0546  NA 143  K 3.9  CL 103  CO2 30  GLUCOSE 155*  BUN 11  CREATININE 0.86  CALCIUM 9.7    Intake/Output Summary (Last 24 hours) at 03/08/2019 1127 Last data filed at 03/08/2019 0740 Gross per 24 hour  Intake 720 ml  Output -  Net 720 ml      Physical Exam: Blood pressure 98/60, pulse 96, temperature 98 F (36.7 C), temperature source Oral, resp. rate 18, height 5\' 9"  (1.753 m), weight (!) 160.8 kg, last menstrual period 06/17/2012, SpO2 99 %. Constitutional: sitting up in bariatric bed- RN at bedside; smiling;  NAD HENT: Normocephalic. Scars looking better slowly- lighter  Eyes: Conjugate gaze Cardiovascular: RRR Respiratory: CTA B/L GI: soft, NT, ND, (+) hypoactive BS Skin: wound in crease of waist on back- more superficial, smaller in length and width- granulating well; no drainage Psych:smiling, happy Musc: Lower extremity edema improving. Still TTP over R>>L shoulders- but able to move shoulder much more- has almost full ROM on L; still impaired to <90 degrees on R Neurological: Alert RUE: Shoulder abduction 3 -/5, distally 3+/5  LUE: shoulder abduction 2+/5, elbow flexion/extension 2+/5, hand grip 3+/5 B/l LE: HF, KE 4-/5, ADF 4-/5  Assessment/Plan: 1. Functional deficits secondary to critical illness myopathy which require 3+ hours per day of interdisciplinary therapy in a comprehensive inpatient rehab setting.  Physiatrist is  providing close team supervision and 24 hour management of active medical problems listed below.  Physiatrist and rehab team continue to assess barriers to discharge/monitor patient progress toward functional and medical goals  Care Tool:  Bathing    Body parts bathed by patient: Abdomen, Face, Right upper leg, Left upper leg, Right lower leg, Left lower leg, Chest   Body parts bathed by helper: Right arm, Left arm, Front perineal area, Buttocks     Bathing assist Assist Level: Moderate Assistance - Patient 50 - 74%     Upper Body Dressing/Undressing Upper body dressing   What is the patient wearing?: Pull over shirt    Upper body assist Assist Level: Minimal Assistance - Patient > 75%    Lower Body Dressing/Undressing Lower body dressing      What is the patient wearing?: Pants, Incontinence brief     Lower body assist Assist for lower body dressing: Moderate Assistance - Patient 50 - 74%     Toileting Toileting    Toileting assist Assist for toileting: Maximal Assistance - Patient 25 - 49%     Transfers Chair/bed transfer  Transfers assist  Chair/bed transfer activity did not occur: Safety/medical concerns  Chair/bed transfer assist level: Supervision/Verbal cueing Chair/bed transfer assistive device: Armrests, Walker   Locomotion Ambulation   Ambulation assist   Ambulation activity did not occur: Safety/medical concerns  Assist level: Supervision/Verbal cueing Assistive device: Walker-rolling Max distance: 15'   Walk 10 feet activity   Assist  Walk 10 feet activity did  not occur: Safety/medical concerns  Assist level: Supervision/Verbal cueing Assistive device: Walker-rolling   Walk 50 feet activity   Assist Walk 50 feet with 2 turns activity did not occur: Safety/medical concerns  Assist level: Supervision/Verbal cueing Assistive device: Walker-rolling    Walk 150 feet activity   Assist Walk 150 feet activity did not occur:  Safety/medical concerns  Assist level: Supervision/Verbal cueing Assistive device: Walker-rolling    Walk 10 feet on uneven surface  activity   Assist Walk 10 feet on uneven surfaces activity did not occur: Safety/medical concerns         Wheelchair     Assist Will patient use wheelchair at discharge?: Yes(TBD) Type of Wheelchair: Manual(Per chart review LT goals set for dischrage )           Wheelchair 50 feet with 2 turns activity    Assist    Wheelchair 50 feet with 2 turns activity did not occur: Safety/medical concerns       Wheelchair 150 feet activity     Assist  Wheelchair 150 feet activity did not occur: Safety/medical concerns       Blood pressure 98/60, pulse 96, temperature 98 F (36.7 C), temperature source Oral, resp. rate 18, height 5\' 9"  (1.753 m), weight (!) 160.8 kg, last menstrual period 06/17/2012, SpO2 99 %.   Medical Problem List and Plan: 1.  Decreased functional mobility secondary to CIM due to acute hypoxemic respiratory failure due to ARDS/COVID-19 pneumonia.  Extubated 01/31/2019  Continue CIR  2.  Antithrombotics: -DVT/anticoagulation: Lovenox.  Venous Doppler studies negative             -antiplatelet therapy: Aspirin 81 mg daily 3. Pain Management: Tylenol as needed  Voltaren gel for L shoulder QID  Lidocaine patch for right shoulder.  Flexeril 5mg  TID prn for muscle tightness  Continue diclofenac 50 mg BID x 1 week   2/ 22- kidney function looking great- will make diclofenac 50 mg BID PRN for pt.   2/23- asking for diclofenac today due to pain- recommends 1x/day to reduce risk of kidney issues long term.   2/25- will allow pt to go home on Diclofenac 1x/day at most since kidney issues doing so well 4. Mood: Provide emotional support             -antipsychotic agents: N/A 5. Neuropsych: This patient is capable of making decisions on her own behalf. 6. Skin/Wound Care: Routine skin checks 7.  Fluids/Electrolytes/Nutrition: Routine in and outs.    Magnesium Oxide 400 mg BID as well as K+ replete   Potassium 3.8 on 2/18, labs ordered for tomorrow  Magnesium 1.7 on 2/10  2/22- will replete KCL 40 mEq x2 and increase daily to 40 mEq and monitor  2/23- K+ up to 3.9- con't daily 40 mEq and recheck Thursday  2/24- check labs tomorrow- also note since on Diclofenac, monitor kidney function.  2/25- will check in AM- didn't get drawn this AM  8.  Acute on chronic diastolic congestive heart failure.  Echocardiogram ejection fraction 50%.  Monitor for any signs of fluid overload.  Continue Demadex 20 mg twice daily             Daily weights.   Filed Weights   03/06/19 0500 03/07/19 0419 03/08/19 0344  Weight: (!) 160.1 kg (!) 160.5 kg (!) 160.8 kg   Stable on 2/22- will con't to monitor ,esp. with diclofenac which can cause some fluid retention for some pts  2/24- weight basically stable  at 160.5 kg 9.  Diabetes mellitus with peripheral neuropathy.  Hemoglobin A1c 7.1.    Check blood sugars before meals and at bedtime.  Diabetic teaching   CBG (last 3)  Recent Labs    03/07/19 2341 03/08/19 0350 03/08/19 0831  GLUCAP 169* 139* 240*   Continue insulin pump  Started home Ozempic   2/22- mid 100s- much improved for this pt- con't dose for now.   2/24- 124- 223- MUCH improved- con't regimen  2/25- since have room to go, asked pt to increase insulin pump to level 4- she will do so and will monitor BGs as ordered             Monitor with increased mobility 10.  Cardiogenic shock.  Weaned from pressors.  Monitor for orthostasis.    Decrease Coreg to 6.25 mg BID  11.  Super obesity.  BMI 54.50.  Dietary follow-up.  Encourage weight loss  2/22- BMI down to 52.13- has lost ~ 8+ lbs since admission con't to monitor/encourage weight loss.  12.  Hyperlipidemia.  Crestor 13.  History of anemia: Resolved  Continue iron supplement.   14.  Sickle cell trait.  Follow-up outpatient.  See  #13 15. Constipation  Improved  16. L ear popping  Improved 17. Posterior Waist pressure ulcer  2/23- con't wound care in healing.improving ulcer 18. Dispo  2/24- will get pt handicapped placard before d/c on Saturday  LOS: 27 days A FACE TO FACE EVALUATION WAS PERFORMED  Kaitlin [name] 03/08/2019, 11:27 AM

## 2019-03-08 NOTE — Progress Notes (Signed)
Physical Therapy Session Note  Patient Details  Name: Kaitlin [name] MRN: [id number] Date of Birth: 05/10/1965  Today's Date: 03/08/2019 PT Individual Time: [id number] PT Individual Time Calculation (min): 83 min   Short Term Goals: Week 4:  PT Short Term Goal 1 (Week 4): =LTG due to ELOS  Skilled Therapeutic Interventions/Progress Updates:    Pt received seated in recliner in room, agreeable to PT session. No complaints of pain. Sit to stand with Supervision to RW throughout session. Ambulation into bathroom with RW and Supervision. Pt is Supervision for standing balance while doffing pants, about to continently void on BSC over toilet, pt is mod A to pull brief and pants back on. Dependent transport via w/c to therapy gym for time conservation. Standing tolerance and balance with no UE support and CGA while performing ball toss against rebounder: x 10 reps, 2 x 15 reps. Pt has one near LOB that she is able to recover by reaching for RW and with CGA. Took pt outdoors for improved mood and functional training over unlevel ground. Pt is able to perform sit to stand to RW on uneven ground with Supervision and assist for RW stabilization. Ambulation 3 x 150 ft with RW at [facility] level initially progressing to Supervision with w/c follow for patient safety across uneven ground and up/down inclines. Pt initially fearful of gait across uneven ground outdoors but confidence improves with each lap. Pt reports feeling fatigued at end of session and requests to return to bed. Stand pivot transfer back to bed with RW and Supervision. Sit to supine Supervision. Pt left semi-reclined in bed with needs in reach at end of session. Cotreatment session with recreational therapy.  Therapy Documentation Precautions:  Precautions Precautions: Fall Precaution Comments: morbid obesity, monitor SpO2 and HR Restrictions Weight Bearing Restrictions: No   Therapy/Group: Individual Therapy   Excell Seltzer, PT,  DPT  03/08/2019, 3:27 PM

## 2019-03-08 NOTE — Progress Notes (Signed)
Recreational Therapy Session Note  Patient Details  Name: TONEISHA STRANGER MRN: [id number] Date of Birth: Oct 10, 1965 Today's Date: 03/08/2019 Time:  1416-1500 Pain: no c/o Skilled Therapeutic Interventions/Progress Updates: Session focused on community mobility using RW.  Pt ambulated ~150' with RW with contact guard assist on outdoor uneven surfaces.  After seated rest breaks, pt completed 150' x2 with supervision.  Discussed safety concerns with community mobility and discharge planning during seated rest breaks.  Pt smiling and laughing throughout this session and excited about upcoming discharge.  Therapy/Group: Co-Treatment  Aniello Christopoulos 03/08/2019, 4:15 PM

## 2019-03-08 NOTE — Plan of Care (Signed)
  Problem: Consults Goal: RH GENERAL PATIENT EDUCATION Description: See Patient Education module for education specifics. Outcome: Progressing Goal: Skin Care Protocol Initiated - if Braden Score 18 or less Description: If consults are not indicated, leave blank or document N/A Outcome: Progressing   Problem: RH BLADDER ELIMINATION Goal: RH STG MANAGE BLADDER WITH ASSISTANCE Description: STG Manage Bladder With mod Assistance Outcome: Progressing   Problem: RH SKIN INTEGRITY Goal: RH STG SKIN FREE OF INFECTION/BREAKDOWN Description: Pt will be free of skin breakdown or pressure ulcers prior to DC with mod assist Outcome: Progressing Goal: RH STG MAINTAIN SKIN INTEGRITY WITH ASSISTANCE Description: STG Maintain Skin Integrity With mod Assistance. Outcome: Progressing Goal: RH STG ABLE TO PERFORM INCISION/WOUND CARE W/ASSISTANCE Description: STG Able To Perform Incision/Wound Care With total Assistance. Outcome: Progressing   Problem: RH SAFETY Goal: RH STG ADHERE TO SAFETY PRECAUTIONS W/ASSISTANCE/DEVICE Description: STG Adhere to Safety Precautions With mod Assistance/Device. Outcome: Progressing

## 2019-03-08 NOTE — Discharge Summary (Signed)
Physician Discharge Summary  Patient ID: Kaitlin [name] MRN: [id number] DOB/AGE: May 17, 1965 54 y.o.  Admit date: 02/09/2019 Discharge date: 03/10/2019  Discharge Diagnoses:  Principal Problem:   Critical illness myopathy Active Problems:   COVID-19   Debility   Sickle cell trait ([place])   Labile blood glucose   Diabetic peripheral neuropathy (H[facility])   Hypokalemia   Pain in joint of left shoulder DVT prophylaxis Morbid obesity Acute on chronic diastolic congestive heart failure Cardiogenic shock Hyperlipidemia Constipation Posterior waist pressure ulcer SARS coronavirus  Discharged Condition: Stable  Significant Diagnostic Studies: No results found.  Labs:  Basic Metabolic Panel: Recent Labs  Lab 03/05/19 0531 03/06/19 0546  NA 140 143  K 3.3* 3.9  CL 100 103  CO2 28 30  GLUCOSE 184* 155*  BUN 17 11  CREATININE 0.96 0.86  CALCIUM 9.3 9.7    CBC: Recent Labs  Lab 03/05/19 0531  WBC 6.3  NEUTROABS 2.6  HGB 12.2  HCT 36.8  MCV 80.5  PLT 247    CBG: Recent Labs  Lab 03/08/19 1143 03/08/19 1639 03/08/19 2007 03/09/19 0010 03/09/19 0432  GLUCAP 170* 135* 182* 128* 127*   Family history.  Father with CAD.  Noted history of diabetes mellitus.  Denies colon cancer rectal cancer or esophageal cancer  Brief HPI:   Kaitlin [name] is a 54 y.o. right-handed female.  Per chart review patient lives with her family.  1 level apartment with level entry independent prior to admission she is unemployed.  She has family provide assistance on discharge.  Presented 01/13/2019 with increasing shortness of breath cough generalized weakness and nausea.  Patient had been exposed to her son who recent was positive for COVID-19.  In the ED WBC 8.7, hemoglobin 12.7, platelets 203,000, lactic acid normal, BUN 22, creatinine 1.17, D-dimer 1.54, troponin high-sensitivity 29, SARS coronavirus 01/12/2019 +.  It was noted patient recently checked for coronavirus 11/19/2018 that was negative.   Chest x-ray bilateral diffuse patchy airspace opacities concerning for multifocal pneumonia.  Patient received remdesivir, dexamethasone as well as actemra.  Antibiotics later broadened due to shock requiring pressors and stress steroids which is since been discontinued.  Received 1000 mL normal saline bolus for hypotension her Coreg initially held.  Echocardiogram with ejection fraction of [id number] grade 1 diastolic dysfunction without emboli.  Bilateral Dopplers lower extremities negative.  She did require intubation and extubated 01/31/2019 wean from supplemental oxygen.  Completed 21 days of therapy for COVID-19.  Patient had been receiving wound care for partial-thickness blistered area on her abdomen.  Diet advanced to regular.  Patient was admitted for a comprehensive rehab program.   Hospital Course: Kaitlin [name] was admitted to rehab 02/09/2019 for inpatient therapies to consist of PT, ST and OT at least three hours five days a week. Past admission physiatrist, therapy team and rehab RN have worked together to provide customized collaborative inpatient rehab.  Pertaining to patient's debility related to critical illness myopathy due to acute hypoxic respiratory failure due to ARDS COVID-19 pneumonia.  She was extubated 01/31/2019.  She was attending full therapies with excellent overall progress.  Subcutaneous Lovenox for DVT prophylaxis venous Doppler studies negative she did continue on low-dose aspirin.  Pain managed with use of Voltaren gel to left shoulder 4 times daily as well as a Lidoderm patch.  Flexeril as needed muscle tightness as well as diclofenac twice daily x1 week.  She exhibited no signs of fluid overload she remained on low-dose Demadex.  No increasing shortness of breath.  Blood sugars controlled hemoglobin A1c 7.1 her insulin pump was resumed with assistance of diabetic coordinator.  Crestor ongoing for hyperlipidemia.  Sickle cell trait she would follow-up as outpatient.  Posterior  waist pressure ulcer healing nicely with local wound care.  Morbid obesity BMI 52.35 dietary follow-up.   Blood pressures were monitored on TID basis and controlled  Diabetes has been monitored with ac/hs CBG checks and SSI was use prn for tighter BS control.   She is continent of bowel and bladder.  She has made gains during rehab stay and is attending therapies  Kaitlin [name] will continue to receive follow up therapies   after discharge  Rehab course: During patient's stay in rehab weekly team conferences were held to monitor patient's progress, set goals and discuss barriers to discharge. At admission, patient required Maxi move for transfers max assist supine to sit.  Total assist grooming total assist toileting total assist hygiene  Physical exam. Blood pressure 108/65 pulse 104 temperature 98.4 respirations 20 oxygen saturations 89% room air Constitutional.  Morbidly obese no acute distress HEENT Head.  Normocephalic and atraumatic Eyes.  Pupils round and reactive to light no discharge without nystagmus Neck.  Supple nontender no JVD without thyromegaly Cardiac regular rate rhythm without any extra sounds or murmur heard Respiratory.  Effort normal no respiratory distress without wheeze GI.  Soft nontender positive bowel sounds without rebound Musculoskeletal.  Lower extremity edema +1 Neurological alert oriented x3 Motor right upper extremity shoulder abduction 2+ out of 5 elbow flexion extension 3- out of 5 hand grip 4- out of 5 Left upper extremity shoulder abduction 2 out of 5 elbow flexion extension 2+ out of 5 hand grip 3 out of 5 Bilateral lower extremities hip flexors knee extension 3+ out of 5 ankle dorsiflexion 4- out of 5 Sensation intact to light touch Abdominal skin abrasion without drainage or odor  /She  has had improvement in activity tolerance, balance, postural control as well as ability to compensate for deficits. Kaitlin [name] has had improvement in functional use RUE/LUE   and RLE/LLE as well as improvement in awareness.  Sit to stand with supervision rolling walker.  Ambulates to the bathroom rolling walker supervision.  Patient supervision for standing balance while doffing pants.  Supervision for standing balance.  She can increase her ambulation up to 150 feet x 3 with a rolling walker and contact-guard.  Gather his belongings for activities day living and homemaking.  Patient overall encouraged to progress family teaching completed and plan discharge to home.       Disposition: Discharge to home    Diet: Diabetic diet  Special Instructions: No driving smoking or alcohol  Skin care.  Skin care to areas of inteertriginous dermatitis in the inflammatory bilateral inguinal and sub-pannicular areas.  May also be used in the intragluteal cleft.  Cleanse with soap and water rinse and pat dry apply house antimicrobial textile.  Measuring Length of Interdry to fit in skin folds that have skin breakdown.  Tuck Interdry fabric and to skin folds in a single layer allow for 2 inches of overhang from skin edges to allow for a wicking to occur.  May remove to bathe dry area thoroughly and then tucked in to affected areas again.  Do not apply any creams or ointments when using Interdry do not throw away for 5 days unless soiled with stool do not wash product.  Wound care to partial-thickness ruptured and unruptured blistered areas.  Cleanse  with normal saline pat dry cover with silicone foam dressings.  Change every 3 days and as needed for rolling of dressing edges soiling or dressing dislodgment.  Turn and reposition from side to side and minimize time in the supine position  Medications at discharge. 1.  Tylenol as needed 2.  Vitamin C 1500 mg p.o. daily 3.  Aspirin 81 mg p.o. daily 4.  Coreg 6.25 mg p.o. twice daily 5.  Vitamin D 5000 units p.o. daily 6.  Flexeril 5 mg p.o. 3 times daily as needed 7.  Voltaren gel 2 g 4 times daily 8.  Ferrous sulfate 325 mg p.o.  daily 9.  Continue insulin pump as prior to admission 10.  Lidoderm patch change as directed 11.  Crestor 40 mg p.o. daily 12.Semaglutide 0.25 mg every Friday 13.  Demadex 20 mg p.o. twice daily 14.Voltaren 50 mg daily  Discharge Instructions    Ambulatory referral to Physical Medicine Rehab   Complete by: As directed    Moderate complexity follow up 1-2 weeks critical illness myopathy      Follow-up Information    Lovorn, Jinny Blossom, MD Follow up.   Specialty: Physical Medicine and Rehabilitation Why: Only as directed Contact information: [address] [place] [address] [phone]           Signed: Cathlyn Parsons 03/09/2019, 5:14 AM

## 2019-03-08 NOTE — Progress Notes (Signed)
Physical Therapy Session Note  Patient Details  Name: Kaitlin [name] MRN: [id number] Date of Birth: 1965/09/11  Today's Date: 03/08/2019 PT Individual Time: 0830-0902 PT Individual Time Calculation (min): 32 min   Short Term Goals: Week 3:  PT Short Term Goal 1 (Week 3): Pt will ambulate x 100 ft with assist x 1 PT Short Term Goal 1 - Progress (Week 3): Met PT Short Term Goal 2 (Week 3): Pt will perform sit to stand transfer with assist x 1 consistently PT Short Term Goal 2 - Progress (Week 3): Met PT Short Term Goal 3 (Week 3): Pt will tolerate standing x 5 min while performing functional task PT Short Term Goal 3 - Progress (Week 3): Met Week 4:  PT Short Term Goal 1 (Week 4): =LTG due to ELOS  Skilled Therapeutic Interventions/Progress Updates:    Focused on functional mobility training, dynamic standing balance, and overall endurance/activity tolerance.  Performed bed mobility with supervision using bedrails to come to EOB for safety due to air mattress. Functional gait in and out of bathroom and around room with RW with supervision for shorter distances ~ 5-15' with cues for upright posture and decreased reliance on UE support. Performed toilet transfer to elevated BSC with supervision and total assist for hygiene while maintaining functional dynamic standing balance with supervision. Total assist to don tedhose and pt able to thread pants but requires some assist with pulling up completely. PT assisted with shoes. Pt able to don shirt with min assist overall from seated position in recliner. Engaged in oral hygiene and washing face at sink independently from w/c. Short distance gait without AD from sink to recliner ~ 6' with min assist including a turn. Discussed recommendation to only attempt ambulation without AD with therapists at this time per patient question. All needs in reach.   Therapy Documentation Precautions:  Precautions Precautions: Fall Precaution Comments: morbid  obesity, monitor SpO2 and HR Restrictions Weight Bearing Restrictions: No   Pain:  No complaints.   Therapy/Group: Individual Therapy  Canary Brim Ivory Broad, PT, DPT, CBIS  03/08/2019, 9:19 AM

## 2019-03-08 NOTE — Progress Notes (Signed)
Occupational Therapy Session Note  Patient Details  Name: Kaitlin [name] MRN: [id number] Date of Birth: 08-28-1965  Today's Date: 03/08/2019 OT Individual Time: 0930-1040 OT Individual Time Calculation (min): 70 min    Short Term Goals: Week 3:  OT Short Term Goal 1 (Week 3): patient will complete UB bathing and dressing with mod A OT Short Term Goal 2 (Week 3): Pt will perform toilet tranfsers with mod A OT Short Term Goal 3 (Week 3): Pt will perform toleting tasks with max A OT Short Term Goal 4 (Week 3): Pt will perform UB dressing with mod A  Skilled Therapeutic Interventions/Progress Updates:    Pt resting in recliner upon arrival.  Saebo applied to R deltoids at beginning of session and remained on during session. Pt amb with RW to w/c and transitioned to gym.  Pt stood at [place] for 2 trials (Rings 1-3 and 1-4). Pt completed with no UE support during activity. Reaction time 1.31 and 1.28 seconds. Pt required extended rest break between trials. Pt transitioned to Day Room and engaged in Wii bowling.  Pt stood without seated rest through 9 frames. Pt stood again to complete game. Pt required CGA for standing and amb. Pt returned to room and amb with RW to recliner.  Pt remained in recliner with all needs within reach.   Therapy Documentation Precautions:  Precautions Precautions: Fall Precaution Comments: morbid obesity, monitor SpO2 and HR Restrictions Weight Bearing Restrictions: No    Pain:   Pt denies pain this morning. Pt c/o BUE soreness during therapy; rest and repositioned   Therapy/Group: Individual Therapy  Leroy Libman 03/08/2019, 10:42 AM

## 2019-03-08 NOTE — Progress Notes (Signed)
Social Work Patient ID: Glennon Hamilton, female   DOB: 1965/05/25, 54 y.o.   MRN: [id number]    SW spoke with [facility]([phone]) who confirms CNA is covered under Medicaid. Pt will get HHPT/OT/CNA. States pt only has 15 of 27 units left. States she spoke with the pt who has stated she has not used another home health agency this year. Hoyle Sauer to follow-up with pt to discuss how pt should handle case further.   SW completed Phone assessment with RN Holly/[facility] [phone]) and she indicated pt was accepted for PCS. Pt qualified for 60hrs with expedited referral from [facility]. Pt will have RN assessment to determine number of hours allotted. A referral for CNA request will be sent out to pt preferred locations: 1) [facility], 2) [facility], and 3) [facility].  *SW met with pt in the room to discuss above. SW left message for Carolyn[facility] to inform on pt being approved for [facility] services.   [name], MSW, [place] Office: [phone] Cell: [phone] Fax: [phone]

## 2019-03-09 ENCOUNTER — Inpatient Hospital Stay (HOSPITAL_COMMUNITY): Payer: Medicaid Other

## 2019-03-09 ENCOUNTER — Inpatient Hospital Stay (HOSPITAL_COMMUNITY): Payer: Medicaid Other | Admitting: Physical Therapy

## 2019-03-09 ENCOUNTER — Telehealth: Payer: Self-pay

## 2019-03-09 LAB — GLUCOSE, CAPILLARY
Glucose-Capillary: 109 mg/dL — ABNORMAL HIGH (ref 70–99)
Glucose-Capillary: 127 mg/dL — ABNORMAL HIGH (ref 70–99)
Glucose-Capillary: 128 mg/dL — ABNORMAL HIGH (ref 70–99)
Glucose-Capillary: 147 mg/dL — ABNORMAL HIGH (ref 70–99)
Glucose-Capillary: 176 mg/dL — ABNORMAL HIGH (ref 70–99)
Glucose-Capillary: 228 mg/dL — ABNORMAL HIGH (ref 70–99)

## 2019-03-09 LAB — BASIC METABOLIC PANEL
Anion gap: 10 (ref 5–15)
BUN: 13 mg/dL (ref 6–20)
CO2: 31 mmol/L (ref 22–32)
Calcium: 9.8 mg/dL (ref 8.9–10.3)
Chloride: 102 mmol/L (ref 98–111)
Creatinine, Ser: 1.13 mg/dL — ABNORMAL HIGH (ref 0.44–1.00)
GFR calc Af Amer: >60 mL/min (ref >60)
GFR calc non Af Amer: 56 mL/min — ABNORMAL LOW (ref >60)
Glucose, Bld: 149 mg/dL — ABNORMAL HIGH (ref 70–99)
Potassium: 3.9 mmol/L (ref 3.5–5.1)
Sodium: 143 mmol/L (ref 135–145)

## 2019-03-09 MED ORDER — ALBUTEROL SULFATE HFA 108 (90 BASE) MCG/ACT IN AERS: 1.0000 | INHALATION_SPRAY | Freq: Four times a day (QID) | RESPIRATORY_TRACT | 1 refills | Status: DC | PRN

## 2019-03-09 MED ORDER — TORSEMIDE 20 MG PO TABS: 20.0000 mg | ORAL_TABLET | Freq: Two times a day (BID) | ORAL | 1 refills | Status: DC

## 2019-03-09 MED ORDER — CARVEDILOL 6.25 MG PO TABS: 6.2500 mg | ORAL_TABLET | Freq: Two times a day (BID) | ORAL | 1 refills | Status: DC

## 2019-03-09 MED ORDER — LIDOCAINE 5 % EX PTCH: 1.0000 | MEDICATED_PATCH | CUTANEOUS | 0 refills | Status: DC

## 2019-03-09 MED ORDER — MAGNESIUM OXIDE 400 (241.3 MG) MG PO TABS: 400.0000 mg | ORAL_TABLET | Freq: Two times a day (BID) | ORAL | 0 refills | Status: AC

## 2019-03-09 MED ORDER — CYCLOBENZAPRINE HCL 5 MG PO TABS: 5.0000 mg | ORAL_TABLET | Freq: Three times a day (TID) | ORAL | 0 refills | Status: DC | PRN

## 2019-03-09 MED ORDER — ACETAMINOPHEN 160 MG/5ML PO SOLN: 650.0000 mg | Freq: Four times a day (QID) | ORAL | 0 refills | Status: DC | PRN

## 2019-03-09 MED ORDER — ALBUTEROL SULFATE HFA 108 (90 BASE) MCG/ACT IN AERS: INHALATION_SPRAY | RESPIRATORY_TRACT | 1 refills | Status: AC

## 2019-03-09 MED ORDER — DICLOFENAC SODIUM 1 % EX GEL: 2.0000 g | Freq: Four times a day (QID) | CUTANEOUS | 1 refills | Status: DC

## 2019-03-09 MED ORDER — SENNA 8.6 MG PO TABS: 2.0000 | ORAL_TABLET | Freq: Two times a day (BID) | ORAL | 0 refills | Status: DC

## 2019-03-09 MED ORDER — CALCIUM CARBONATE-VITAMIN D 500-200 MG-UNIT PO TABS: 2.0000 | ORAL_TABLET | Freq: Every day | ORAL | 0 refills | Status: AC

## 2019-03-09 MED ORDER — ROSUVASTATIN CALCIUM 40 MG PO TABS: 40.0000 mg | ORAL_TABLET | Freq: Every day | ORAL | 0 refills | Status: AC

## 2019-03-09 MED ORDER — FERROUS SULFATE 325 (65 FE) MG PO TABS: 325.0000 mg | ORAL_TABLET | Freq: Every day | ORAL | 3 refills | Status: DC

## 2019-03-09 MED ORDER — DICLOFENAC SODIUM 50 MG PO TBEC: 50.0000 mg | DELAYED_RELEASE_TABLET | Freq: Every day | ORAL | 0 refills | Status: DC

## 2019-03-09 MED ORDER — VITAMIN D-3 125 MCG (5000 UT) PO TABS: 5000.0000 [IU] | ORAL_TABLET | Freq: Every day | ORAL | 1 refills | Status: AC

## 2019-03-09 NOTE — Telephone Encounter (Signed)
ERROR

## 2019-03-09 NOTE — Progress Notes (Signed)
Occupational Therapy Session Note  Patient Details  Name: Kaitlin [name] MRN: [id number] Date of Birth: 09-27-1965  Today's Date: 03/09/2019 OT Individual Time: 1330-1425 OT Individual Time Calculation (min): 55 min    Short Term Goals: Week 3:  OT Short Term Goal 1 (Week 3): patient will complete UB bathing and dressing with mod A OT Short Term Goal 2 (Week 3): Pt will perform toilet tranfsers with mod A OT Short Term Goal 3 (Week 3): Pt will perform toleting tasks with max A OT Short Term Goal 4 (Week 3): Pt will perform UB dressing with mod A  Skilled Therapeutic Interventions/Progress Updates:    Pt resting in recliner upon arrival.  OT intervention with focus on sit<>stand, functional amb with RW, standing balance, RUE function, activity tolerance, discharge planning, and safety awareness.  Pt amb with RW from recliner to w/c at doorway. Pt transitioned to Day Room and engaged in standing activity with Wii Bowling.  Pt participated in 2 games of bowling with one rest break between first and second game. Pt used LUE as support only. Pt with appropriate weight shifts throughout games to facilitate a natural bowling movement. Pt required extended rest break after first game.  Pt returned to room and amb with RW from doorway to bed.  Pt returned to bed without assistance. Pt remained in bed with all needs within reach. Pt pleased with progress and ready for discharge tomorrow.   Therapy Documentation Precautions:  Precautions Precautions: Fall Precaution Comments: morbid obesity, monitor SpO2 and HR Restrictions Weight Bearing Restrictions: No Pain:  Pt denies pain this afternoon   Therapy/Group: Individual Therapy  Leroy Libman 03/09/2019, 2:35 PM

## 2019-03-09 NOTE — Progress Notes (Signed)
[facility] PHYSICAL MEDICINE & REHABILITATION PROGRESS NOTE   Subjective/Complaints:  Spt reports didn't change insulin pump lie we discussed- will do so today.  R shoulder is doing better daily- can lift it higher today.   ROS: denies SOB, CP, N/V/C/D- going daily- no diarrhea.   Objective:   No results found. No results for input(s): WBC, HGB, HCT, PLT in the last 72 hours. Recent Labs    03/09/19 0540  NA 143  K 3.9  CL 102  CO2 31  GLUCOSE 149*  BUN 13  CREATININE 1.13*  CALCIUM 9.8    Intake/Output Summary (Last 24 hours) at 03/09/2019 0947 Last data filed at 03/08/2019 1845 Gross per 24 hour  Intake 240 ml  Output --  Net 240 ml      Physical Exam: Blood pressure (!) 109/56, pulse 92, temperature 98.8 F (37.1 C), temperature source Oral, resp. rate 16, height 5\' 9"  (1.753 m), weight (!) 161.2 kg, last menstrual period 06/17/2012, SpO2 98 %. Constitutional: sitting up at EOB in bariatric bed; ready to get dressed; nurse at bedside, NAD HENT: Normocephalic. Scars looking better slowly- lighter  Eyes: conjugate gaze;  Cardiovascular: RRR: no JVD Respiratory: CTA B/L- no accessory muscle use GI: soft, NT, ND, (+)BS hyperactive- just finished breakfast Skin: wound in crease of waist on back- more superficial, smaller in length and width- granulating well; no drainage Musc: Lower extremity edema improving. Still TTP over R>L shoulders- but not wincing with palpation- also can lift R arm higher/deltoid than before- 95-110 degrees vs 90 degrees prior Neurological: Alert RUE: Shoulder abduction 3 -/5, distally 3+/5  LUE: shoulder abduction 2+/5, elbow flexion/extension 2+/5, hand grip 3+/5 B/l LE: HF, KE 4-/5, ADF 4-/5 Psychiatric: smiling, appropriate  Assessment/Plan: 1. Functional deficits secondary to critical illness myopathy which require 3+ hours per day of interdisciplinary therapy in a comprehensive inpatient rehab setting.  Physiatrist is providing  close team supervision and 24 hour management of active medical problems listed below.  Physiatrist and rehab team continue to assess barriers to discharge/monitor patient progress toward functional and medical goals  Care Tool:  Bathing    Body parts bathed by patient: Abdomen, Face, Right upper leg, Left upper leg, Right lower leg, Left lower leg, Chest   Body parts bathed by helper: Right arm, Left arm, Front perineal area, Buttocks     Bathing assist Assist Level: Moderate Assistance - Patient 50 - 74%     Upper Body Dressing/Undressing Upper body dressing   What is the patient wearing?: Pull over shirt    Upper body assist Assist Level: Minimal Assistance - Patient > 75%    Lower Body Dressing/Undressing Lower body dressing      What is the patient wearing?: Pants, Incontinence brief     Lower body assist Assist for lower body dressing: Moderate Assistance - Patient 50 - 74%     Toileting Toileting    Toileting assist Assist for toileting: Maximal Assistance - Patient 25 - 49%     Transfers Chair/bed transfer  Transfers assist  Chair/bed transfer activity did not occur: Safety/medical concerns  Chair/bed transfer assist level: Supervision/Verbal cueing Chair/bed transfer assistive device: Programmer, multimedia   Ambulation assist   Ambulation activity did not occur: Safety/medical concerns  Assist level: Supervision/Verbal cueing Assistive device: Walker-rolling Max distance: 150'   Walk 10 feet activity   Assist  Walk 10 feet activity did not occur: Safety/medical concerns  Assist level: Supervision/Verbal cueing Assistive device: Walker-rolling  Walk 50 feet activity   Assist Walk 50 feet with 2 turns activity did not occur: Safety/medical concerns  Assist level: Supervision/Verbal cueing Assistive device: Walker-rolling    Walk 150 feet activity   Assist Walk 150 feet activity did not occur: Safety/medical  concerns  Assist level: Supervision/Verbal cueing Assistive device: Walker-rolling    Walk 10 feet on uneven surface  activity   Assist Walk 10 feet on uneven surfaces activity did not occur: Safety/medical concerns         Wheelchair     Assist Will patient use wheelchair at discharge?: Yes(TBD) Type of Wheelchair: Manual(Per chart review LT goals set for dischrage )           Wheelchair 50 feet with 2 turns activity    Assist    Wheelchair 50 feet with 2 turns activity did not occur: Safety/medical concerns       Wheelchair 150 feet activity     Assist  Wheelchair 150 feet activity did not occur: Safety/medical concerns       Blood pressure (!) 109/56, pulse 92, temperature 98.8 F (37.1 C), temperature source Oral, resp. rate 16, height 5\' 9"  (1.753 m), weight (!) 161.2 kg, last menstrual period 06/17/2012, SpO2 98 %.   Medical Problem List and Plan: 1.  Decreased functional mobility secondary to CIM due to acute hypoxemic respiratory failure due to ARDS/COVID-19 pneumonia.  Extubated 01/31/2019  Continue CIR  2.  Antithrombotics: -DVT/anticoagulation: Lovenox.  Venous Doppler studies negative             -antiplatelet therapy: Aspirin 81 mg daily 3. Pain Management: Tylenol as needed  Voltaren gel for L shoulder QID  Lidocaine patch for right shoulder.  Flexeril 5mg  TID prn for muscle tightness  Continue diclofenac 50 mg BID x 1 week   2/ 22- kidney function looking great- will make diclofenac 50 mg BID PRN for pt.   2/23- asking for diclofenac today due to pain- recommends 1x/day to reduce risk of kidney issues long term.   2/25- will allow pt to go home on Diclofenac 1x/day at most since kidney issues doing so well  2/26- went over with PA to send home on Diclofenac 1x/day max for shoulder pain due to risk of kidney issues 4. Mood: Provide emotional support             -antipsychotic agents: N/A 5. Neuropsych: This patient is capable of  making decisions on her own behalf. 6. Skin/Wound Care: Routine skin checks 7. Fluids/Electrolytes/Nutrition: Routine in and outs.    Magnesium Oxide 400 mg BID as well as K+ replete   Potassium 3.8 on 2/18, labs ordered for tomorrow  Magnesium 1.7 on 2/10  2/22- will replete KCL 40 mEq x2 and increase daily to 40 mEq and monitor  2/23- K+ up to 3.9- con't daily 40 mEq and recheck Thursday  2/24- check labs tomorrow- also note since on Diclofenac, monitor kidney function.  2/25- will check in AM- didn't get drawn this AM   2/26- K+ 3.9 on current repletion- will con't at d/c.  8.  Acute on chronic diastolic congestive heart failure.  Echocardiogram ejection fraction 50%.  Monitor for any signs of fluid overload.  Continue Demadex 20 mg twice daily             Daily weights.   Filed Weights   03/07/19 0419 03/08/19 0344 03/09/19 0436  Weight: (!) 160.5 kg (!) 160.8 kg (!) 161.2 kg   Stable on 2/22-  will con't to monitor ,esp. with diclofenac which can cause some fluid retention for some pts  2/24- weight basically stable at 160.5 kg 9.  Diabetes mellitus with peripheral neuropathy.  Hemoglobin A1c 7.1.    Check blood sugars before meals and at bedtime.  Diabetic teaching   CBG (last 3)  Recent Labs    03/09/19 0010 03/09/19 0432 03/09/19 0730  GLUCAP 128* 127* 176*   Continue insulin pump  Started home Ozempic   2/22- mid 100s- much improved for this pt- con't dose for now.   2/24- 124- 223- MUCH improved- con't regimen  2/25- since have room to go, asked pt to increase insulin pump to level 4- she will do so and will monitor BGs as ordered  2/26- didn't increase yesterday- will do so today to make sure no hypoglycemia- if does, will revert to earlier dosing.               Monitor with increased mobility 10.  Cardiogenic shock.  Weaned from pressors.  Monitor for orthostasis.    Decrease Coreg to 6.25 mg BID  11.  Super obesity.  BMI 54.50.  Dietary follow-up.  Encourage weight  loss  2/22- BMI down to 52.13- has lost ~ 8+ lbs since admission con't to monitor/encourage weight loss.  12.  Hyperlipidemia.  Crestor 13.  History of anemia: Resolved  Continue iron supplement.   14.  Sickle cell trait.  Follow-up outpatient.  See #13 15. Constipation  Improved  16. L ear popping  Improved 17. Posterior Waist pressure ulcer  2/23- con't wound care in healing.improving ulcer 18. Dispo  2/24- will get pt handicapped placard before d/c on Saturday  2/26- d/c tomorrow-   LOS: 28 days A FACE TO FACE EVALUATION WAS PERFORMED  Kaitlin [name] 03/09/2019, 9:47 AM

## 2019-03-09 NOTE — Progress Notes (Signed)
Physical Therapy Session Note  Patient Details  Name: Kaitlin [name] MRN: [id number] Date of Birth: 04-30-1965  Today's Date: 03/09/2019 PT Individual Time: 3976-7341 PT Individual Time Calculation (min): 74 min   Short Term Goals: Week 3:  PT Short Term Goal 1 (Week 3): Pt will ambulate x 100 ft with assist x 1 PT Short Term Goal 1 - Progress (Week 3): Met PT Short Term Goal 2 (Week 3): Pt will perform sit to stand transfer with assist x 1 consistently PT Short Term Goal 2 - Progress (Week 3): Met PT Short Term Goal 3 (Week 3): Pt will tolerate standing x 5 min while performing functional task PT Short Term Goal 3 - Progress (Week 3): Met Week 4:  PT Short Term Goal 1 (Week 4): =LTG due to ELOS  Skilled Therapeutic Interventions/Progress Updates:    Focused on grad day activities and addressing overall functional mobility, muscular and cardiovascular endurance, and d/c planning. Pt performed functional transfers with RW at supervision level for safety, occasional cues for hand placement. Instructed in ramp negotiation for community mobility retraining over uneven surface with CGA with RW for safety with balance during transitions. Discussed walking outdoors upon d/c and recommendation to avoid grass or gravel at this time and perform outdoor walking with a caregiver and options for seating (ex. Bringing w/c with her) due to decreased endurance. Educated on overall endurance limitations and energy conservation techniques. Performed stair negotatiation for community mobility re-training as well as functional strengthening and balance using bilateral rails on 6" step with CGA and cues for sequencing of foot placement. Repeated x 2 trials with seated rest break due to decreased endurance and extra time for recovery. Initiated HEP for patient to continue to improve functional strengthening and endurance with handout issued at end of session with aexercises. Pt performed seated exercises and then with  standing exercise, pt started to feel a little lightheaded and nauseous. BP assessed (see below) and pt returned to room to test blood glucose level (= 155) which she reports is within her normal range. Pt also thinks the nausea may be from the multiple meds at one time earlier as it seems like this happens around the same time each day. Transferred back to recliner with supervision and demonstrated and educated on the final standing exercises as well as reviewing frequency recommendations and options for both seated and standing exercises depending on the day and her fatigue/activity level.    HEP as follows:  Access Code: [email or web address]  URL: [email or web address]  Date: 03/09/2019  Prepared by: Canary Brim   Exercises Seated Long Arc Quad - 10 reps - 2 sets - 5 hold - 1x daily Seated March - 10 reps - 2 sets - 1x daily Seated Heel Raise - 10 reps - 2 sets - 1x daily Seated Toe Raise - 10 reps - 2 sets - 1x daily Seated Hip Adduction Isometrics with Ball - 10 reps - 2 sets - 5-10 hold - 1x daily Sit to Stand with Armchair - 5 reps - 2 sets - 1x daily Heel rises with counter support - 10 reps - 2 sets - 1x daily Standing Toe Raises at Chair - 10 reps - 2 sets - 1x daily Mini Squat with Counter Support - 10 reps - 2 sets - 1x daily  Therapy Documentation Precautions:  Precautions Precautions: Fall Precaution Comments: morbid obesity, monitor SpO2 and HR Restrictions Weight Bearing Restrictions: No   Therapy Vitals Pulse Rate: (!) (P) 102 BP: (P)  109/75 Patient Position (if appropriate): (P) Sitting Pain: Denies pain.   Therapy/Group: Individual Therapy  Canary Brim Ivory Broad, PT, DPT, CBIS  03/09/2019, 12:01 PM

## 2019-03-09 NOTE — Progress Notes (Signed)
Physical Therapy Discharge Summary  Patient Details  Name: Kaitlin [name] MRN: [id number] Date of Birth: [birth year]  Today's Date: 03/09/2019 PT Individual Time: 0900-1000 PT Individual Time Calculation (min): 60 min    Patient has met 8 of 8 long term goals due to improved activity tolerance, improved balance, improved postural control, increased strength and ability to compensate for deficits.  Patient to discharge at an ambulatory level Supervision.   Patient's care partner is independent to provide the necessary physical assistance at discharge. Both of patient's daughters have completed hands on family education and are safe to assist pt upon d/c home.  Reasons goals not met: Patient has met all rehab goals.  Recommendation:  Patient will benefit from ongoing skilled PT services in home health setting to continue to advance safe functional mobility, address ongoing impairments in endurance, strength, balance, safety, independence with functional mobility, and minimize fall risk.  Equipment: heavy duty RW, 24x18 w/c with ELR, hospital bed  Reasons for discharge: treatment goals met and discharge from hospital  Patient/family agrees with progress made and goals achieved: Yes   Skilled Intervention: Pt received seated in bed, agreeable to PT session. No complaints of pain this AM. Pt requesting to bathe and don new clothes. Pt then reports urge to use the bathroom. Pt is able to perform bed mobility at Supervision level with use of hospital bed features. Pt is able to perform sit to stand with RW at Supervision level. Toilet transfer with Supervision. Pt is min A for clothing management and dependent for pericare. Ambulation 2 x 300 ft with RW and Supervision. Pt left seated in recliner in room with needs in reach at end of session.  PT Discharge Precautions/Restrictions Precautions Precautions: Fall Precaution Comments: morbid obesity, monitor SpO2 and HR Restrictions Weight  Bearing Restrictions: No Vision/Perception  Perception Perception: Within Functional Limits Praxis Praxis: Intact  Cognition Overall Cognitive Status: Within Functional Limits for tasks assessed Arousal/Alertness: Awake/alert Orientation Level: Oriented X4 Attention: Focused;Sustained Focused Attention: Appears intact Sustained Attention: Appears intact Memory: Appears intact Immediate Memory Recall: Sock;Blue;Bed Memory Recall Sock: Without Cue Memory Recall Blue: Without Cue Memory Recall Bed: Without Cue Awareness: Appears intact Problem Solving: Appears intact Safety/Judgment: Appears intact Sensation Sensation Light Touch: Impaired Detail Peripheral sensation comments: hx of B LE peripheral neuropathy but reports decreased sensation in L LE compared to R Light Touch Impaired Details: Impaired LLE Hot/Cold: Appears Intact Proprioception: Appears Intact Stereognosis: Not tested Coordination Gross Motor Movements are Fluid and Coordinated: No Fine Motor Movements are Fluid and Coordinated: No Coordination and Movement Description: impaired due to significant B  shoulder weakness Finger Nose Finger Test: left unable due to shoulder limitations, right slow Motor  Motor Motor: Other (comment);Abnormal postural alignment and control Motor - Skilled Clinical Observations: generalized weakness Motor - Discharge Observations: generalized weakness  Mobility Bed Mobility Bed Mobility: Rolling Right;Rolling Left;Supine to Sit;Sit to Supine;Scooting to [facility] Rolling Right: Supervision/verbal cueing Rolling Left: Supervision/Verbal cueing Supine to Sit: Supervision/Verbal cueing Sit to Supine: Supervision/Verbal cueing Scooting to HOB: Supervision/Verbal Cueing Transfers Transfers: Sit to Stand;Stand to Sit;Stand Pivot Transfers Sit to Stand: Supervision/Verbal cueing Stand to Sit: Supervision/Verbal cueing Stand Pivot Transfers: Supervision/Verbal cueing Stand Pivot Transfer  Details: Verbal cues for technique;Verbal cues for precautions/safety;Verbal cues for safe use of DME/AE Transfer (Assistive device): Rolling walker Locomotion  Gait Ambulation: Yes Gait Assistance: Supervision/Verbal cueing Gait Distance (Feet): 300 Feet Assistive device: Rolling walker Gait Assistance Details: Verbal cues for precautions/safety;Verbal cues for safe use of DME/AE  Gait Gait: Yes Gait Pattern: Impaired Gait Pattern: Trunk flexed;Wide base of support Gait velocity: decreased Stairs / Additional Locomotion Stairs: Yes Stairs Assistance: Contact Guard/Touching assist Stair Management Technique: Two rails;Step to pattern Number of Stairs: 8 Height of Stairs: 6 Ramp: Supervision/Verbal cueing Curb: Nurse, mental health Mobility: Yes Wheelchair Assistance: Chartered loss adjuster: Both upper extremities Wheelchair Parts Management: Needs assistance Distance: 100  Trunk/Postural Assessment  Cervical Assessment Cervical Assessment: Within Functional Limits Thoracic Assessment Thoracic Assessment: Exceptions to WFL(rounded shoulders) Lumbar Assessment Lumbar Assessment: Exceptions to WFL(posterior pelvic tilt) Postural Control Postural Control: Within Functional Limits  Balance Balance Balance Assessed: Yes Static Sitting Balance Static Sitting - Balance Support: Feet supported;No upper extremity supported Static Sitting - Level of Assistance: 6: Modified independent (Device/Increase time) Dynamic Sitting Balance Dynamic Sitting - Balance Support: No upper extremity supported;Feet supported;During functional activity Dynamic Sitting - Level of Assistance: 5: Stand by assistance Sitting balance - Comments: supervision  during funcitonal    task Static Standing Balance Static Standing - Balance Support: Bilateral upper extremity supported;During functional activity Static Standing - Level of  Assistance: 5: Stand by assistance Dynamic Standing Balance Dynamic Standing - Balance Support: Bilateral upper extremity supported;During functional activity Dynamic Standing - Level of Assistance: 5: Stand by assistance Extremity Assessment  RUE Assessment Passive Range of Motion (PROM) Comments: WFL Active Range of Motion (AROM) Comments: shoulder 3/4, distal WFL General Strength Comments: 3/5 LUE Assessment Passive Range of Motion (PROM) Comments: WFL, painful end range Active Range of Motion (AROM) Comments: < 1/4 against gravity at shoulder, 3/4 to full distal General Strength Comments: 2/5 prox, 3/5 distal RLE Assessment RLE Assessment: Within Functional Limits RLE Strength Right Hip Flexion: 4/5 Right Hip ABduction: 3/5 Right Hip ADduction: 4/5 Right Knee Flexion: 4/5 Right Knee Extension: 4/5 Right Ankle Dorsiflexion: 4/5 Right Ankle Plantar Flexion: 4/5 LLE Assessment LLE Assessment: Exceptions to [facility] LLE Strength Left Hip Flexion: 3/5 Left Hip ABduction: 3/5 Left Hip ADduction: 3/5 Left Knee Extension: 4/5 Left Ankle Dorsiflexion: 4/5 Left Ankle Plantar Flexion: 4/5     Excell Seltzer, PT, DPT 03/09/2019, 3:49 PM

## 2019-03-09 NOTE — Plan of Care (Signed)
  Problem: RH BLADDER ELIMINATION Goal: RH STG MANAGE BLADDER WITH ASSISTANCE Description: STG Manage Bladder With mod Assistance Outcome: Progressing   Problem: Consults Goal: RH GENERAL PATIENT EDUCATION Description: See Patient Education module for education specifics. Outcome: Progressing Goal: Skin Care Protocol Initiated - if Braden Score 18 or less Description: If consults are not indicated, leave blank or document N/A Outcome: Progressing   Problem: RH SKIN INTEGRITY Goal: RH STG SKIN FREE OF INFECTION/BREAKDOWN Description: Pt will be free of skin breakdown or pressure ulcers prior to DC with mod assist Outcome: Progressing Goal: RH STG MAINTAIN SKIN INTEGRITY WITH ASSISTANCE Description: STG Maintain Skin Integrity With mod Assistance. Outcome: Progressing Goal: RH STG ABLE TO PERFORM INCISION/WOUND CARE W/ASSISTANCE Description: STG Able To Perform Incision/Wound Care With total Assistance. Outcome: Progressing   Problem: RH SAFETY Goal: RH STG ADHERE TO SAFETY PRECAUTIONS W/ASSISTANCE/DEVICE Description: STG Adhere to Safety Precautions With mod Assistance/Device. Outcome: Progressing

## 2019-03-09 NOTE — Progress Notes (Signed)
Recreational Therapy Discharge Summary Patient Details  Name: Kaitlin [name] MRN: [id number] Date of Birth: Mar 28, 1965 Today's Date: 03/09/2019  Long term goals set: 1  Long term goals met: 1  Comments on progress toward goals: Pt has made excellent progress during LOS and is ready for discharge home tomorrow at supervision level.  Pt requires set up assistance for seated TR tasks and supervision-contact guard assist for standing task and community ambulation with RW.  TR sessions focused on activity tolerance, standing tolerance/balance, community mobility and coping.  Education provided on activity analysis, energy conservation, & coping strategies.    Reasons for discharge: discharge from hospital  Patient/family agrees with progress made and goals achieved: Yes  [name] 03/09/2019, 8:31 AM

## 2019-03-09 NOTE — Progress Notes (Signed)
Occupational Therapy Discharge Summary  Patient Details  Name: Kaitlin [name] MRN: [id number] Date of Birth: 08/26/1965   Patient has met 71 of 12 long term goals due to improved activity tolerance, improved balance, postural control, ability to compensate for deficits, functional use of  RIGHT upper, RIGHT lower, LEFT upper and LEFT lower extremity and improved coordination. Pt made steady progress with BADLs and functional transfers during this admission.  Pt continues to fatigue easily but takes rest breaks appropriately during BADLs. Pt  Requires mod A for bathing, min A for UB dressing tasks, and mod A for LB dressing tasks.Pt performs toilet and shower transfers with supervision. Pt requires max A for toileting tasks. Pt's daughter has been present and participated in therapy sessions. Pt independent with directing care/assistance. Patient to discharge at [facility] Assist level.  Patient's care partner is independent to provide the necessary physical assistance at discharge.    Reasons goals not met: Pt did not meet LB dressing or toileting goal d/t body habitus/decreased BUE ROM impacting functional reach during ADL activities. Pt family able to provide necessary A at d/c  Recommendation:  Patient will benefit from ongoing skilled OT services in home health setting\ to continue to advance functional skills in the area of BADL.  Equipment: TTB and BSC  Reasons for discharge: treatment goals met and discharge from hospital  Patient/family agrees with progress made and goals achieved: Yes  OT Discharge Vision Baseline Vision/History: Wears glasses Wears Glasses: Reading only Patient Visual Report: No change from baseline Vision Assessment?: No apparent visual deficits Perception  Perception: Within Functional Limits Praxis Praxis: Intact Cognition Overall Cognitive Status: Within Functional Limits for tasks assessed Arousal/Alertness: Awake/alert Orientation Level: Oriented  X4 Attention: Focused;Sustained Focused Attention: Appears intact Sustained Attention: Appears intact Memory: Appears intact Immediate Memory Recall: Sock;Blue;Bed Memory Recall Sock: Without Cue Memory Recall Blue: Without Cue Memory Recall Bed: Without Cue Awareness: Appears intact Problem Solving: Appears intact Safety/Judgment: Appears intact Sensation Sensation Light Touch: Impaired Detail Peripheral sensation comments: hx of B LE peripheral neuropathy but reports decreased sensation in L LE compared to R Hot/Cold: Appears Intact Proprioception: Appears Intact Stereognosis: Not tested Coordination Gross Motor Movements are Fluid and Coordinated: No Fine Motor Movements are Fluid and Coordinated: No Coordination and Movement Description: impaired due to significant B  shoulder weakness Finger Nose Finger Test: left unable due to shoulder limitations, right slow Motor  Motor Motor - Skilled Clinical Observations: generalized weakness Mobility     Trunk/Postural Assessment  Cervical Assessment Cervical Assessment: Within Functional Limits Thoracic Assessment Thoracic Assessment: (rounded shoulders) Lumbar Assessment Lumbar Assessment: (posterior pelvic tilt)  Balance Static Sitting Balance Static Sitting - Balance Support: Feet supported Static Sitting - Level of Assistance: 6: Modified independent (Device/Increase time) Dynamic Sitting Balance Sitting balance - Comments: supervision  during funcitonal    task Extremity/Trunk Assessment RUE Assessment Passive Range of Motion (PROM) Comments: WFL Active Range of Motion (AROM) Comments: shoulder 3/4, distal WFL General Strength Comments: 3/5 LUE Assessment Passive Range of Motion (PROM) Comments: WFL, painful end range Active Range of Motion (AROM) Comments: < 1/4 against gravity at shoulder, 3/4 to full distal General Strength Comments: 2/5 prox, 3/5 distal   Leroy Libman 03/09/2019, 1:45 PM

## 2019-03-10 LAB — GLUCOSE, CAPILLARY
Glucose-Capillary: 120 mg/dL — ABNORMAL HIGH (ref 70–99)
Glucose-Capillary: 180 mg/dL — ABNORMAL HIGH (ref 70–99)

## 2019-03-10 NOTE — Progress Notes (Signed)
[facility] PHYSICAL MEDICINE & REHABILITATION PROGRESS NOTE   Subjective/Complaints:  Pt without new problems overnight. Excited to be going home today. Had questions about kdur which wasn't rx'ed for outpt use  ROS: Patient denies fever, rash, sore throat, blurred vision, nausea, vomiting, diarrhea, cough, shortness of breath or chest pain, joint or back pain, headache, or mood change.   Objective:   No results found. No results for input(s): WBC, HGB, HCT, PLT in the last 72 hours. Recent Labs    03/09/19 0540  NA 143  K 3.9  CL 102  CO2 31  GLUCOSE 149*  BUN 13  CREATININE 1.13*  CALCIUM 9.8    Intake/Output Summary (Last 24 hours) at 03/10/2019 0853 Last data filed at 03/09/2019 2100 Gross per 24 hour  Intake 1020 ml  Output --  Net 1020 ml      Physical Exam: Blood pressure 118/77, pulse (!) 105, temperature 98.2 F (36.8 C), resp. rate 18, height 5\' 9"  (1.753 m), weight (!) 158.3 kg, last menstrual period 06/17/2012, SpO2 100 %. Constitutional: No distress . Vital signs reviewed. Morbidly obese HEENT: EOMI, oral membranes moist Neck: supple Cardiovascular: RRR without murmur. No JVD    Respiratory/Chest: CTA Bilaterally without wheezes or rales. Normal effort    GI/Abdomen: BS +, non-tender, non-distended Ext: no edemaSkin: wound in crease of waist on back- more superficial, smaller in length and width- granulating well; no drainage Musc: Lower extremity edema improving. Still TTP over R>L shoulders- but not wincing with palpation- also can lift R arm higher/deltoid than before- 95-110 degrees vs 90 degrees prior Neurological: Alert RUE: Shoulder abduction 3 -/5, distally 3+/5  LUE: shoulder abduction 2+/5, elbow flexion/extension 2+/5, hand grip 3+/5 B/l LE: HF, KE 4-/5, ADF 4-/5 Psychiatric: pleasant and cooperative  Assessment/Plan: 1. Functional deficits secondary to critical illness myopathy which require 3+ hours per day of interdisciplinary therapy in  a comprehensive inpatient rehab setting.  Physiatrist is providing close team supervision and 24 hour management of active medical problems listed below.  Physiatrist and rehab team continue to assess barriers to discharge/monitor patient progress toward functional and medical goals  Care Tool:  Bathing    Body parts bathed by patient: Abdomen, Face, Right upper leg, Left upper leg, Right lower leg, Left lower leg, Chest, Left arm   Body parts bathed by helper: Right arm, Left arm, Front perineal area, Buttocks     Bathing assist Assist Level: Moderate Assistance - Patient 50 - 74%     Upper Body Dressing/Undressing Upper body dressing   What is the patient wearing?: Pull over shirt    Upper body assist Assist Level: Minimal Assistance - Patient > 75%    Lower Body Dressing/Undressing Lower body dressing      What is the patient wearing?: Pants     Lower body assist Assist for lower body dressing: Minimal Assistance - Patient > 75%     Toileting Toileting    Toileting assist Assist for toileting: Maximal Assistance - Patient 25 - 49%     Transfers Chair/bed transfer  Transfers assist  Chair/bed transfer activity did not occur: Safety/medical concerns  Chair/bed transfer assist level: Supervision/Verbal cueing Chair/bed transfer assistive device: Armrests, Programmer, multimedia   Ambulation assist   Ambulation activity did not occur: Safety/medical concerns  Assist level: Supervision/Verbal cueing Assistive device: Walker-rolling Max distance: 300'   Walk 10 feet activity   Assist  Walk 10 feet activity did not occur: Safety/medical concerns  Assist  level: Supervision/Verbal cueing Assistive device: Walker-rolling   Walk 50 feet activity   Assist Walk 50 feet with 2 turns activity did not occur: Safety/medical concerns  Assist level: Supervision/Verbal cueing Assistive device: Walker-rolling    Walk 150 feet activity   Assist  Walk 150 feet activity did not occur: Safety/medical concerns  Assist level: Supervision/Verbal cueing Assistive device: Walker-rolling    Walk 10 feet on uneven surface  activity   Assist Walk 10 feet on uneven surfaces activity did not occur: Safety/medical concerns   Assist level: Contact Guard/Touching assist Assistive device: Aeronautical engineer Will patient use wheelchair at discharge?: Yes Type of Wheelchair: Manual    Wheelchair assist level: Supervision/Verbal cueing Max wheelchair distance: 100'    Wheelchair 50 feet with 2 turns activity    Assist    Wheelchair 50 feet with 2 turns activity did not occur: Safety/medical concerns   Assist Level: Supervision/Verbal cueing   Wheelchair 150 feet activity     Assist  Wheelchair 150 feet activity did not occur: Safety/medical concerns   Assist Level: Minimal Assistance - Patient > 75%   Blood pressure 118/77, pulse (!) 105, temperature 98.2 F (36.8 C), resp. rate 18, height 5\' 9"  (1.753 m), weight (!) 158.3 kg, last menstrual period 06/17/2012, SpO2 100 %.   Medical Problem List and Plan: 1.  Decreased functional mobility secondary to CIM due to acute hypoxemic respiratory failure due to ARDS/COVID-19 pneumonia.  Extubated 01/31/2019  Dc home today  2.  Antithrombotics: -DVT/anticoagulation: Lovenox.  Venous Doppler studies negative             -antiplatelet therapy: Aspirin 81 mg daily 3. Pain Management: Tylenol as needed  Voltaren gel for L shoulder QID  Lidocaine patch for right shoulder.  Flexeril 5mg  TID prn for muscle tightness  Continue diclofenac 50 mg BID x 1 week   2/ 22- kidney function looking great- will make diclofenac 50 mg BID PRN for pt.   2/23- asking for diclofenac today due to pain- recommends 1x/day to reduce risk of kidney issues long term.   2/25- will allow pt to go home on Diclofenac 1x/day at most since kidney issues doing so well  2/26- went over  with PA to send home on Diclofenac 1x/day max for shoulder pain due to risk of kidney issues 4. Mood: Provide emotional support             -antipsychotic agents: N/A 5. Neuropsych: This patient is capable of making decisions on her own behalf. 6. Skin/Wound Care: Routine skin checks 7. Fluids/Electrolytes/Nutrition: Routine in and outs.    Magnesium Oxide 400 mg BID as well as K+ replete   Potassium 3.8 on 2/18, labs ordered for tomorrow  Magnesium 1.7 on 2/10  2/22- will replete KCL 40 mEq x2 and increase daily to 40 mEq and monitor  2/23- K+ up to 3.9- con't daily 40 mEq and recheck Thursday  2/24- check labs tomorrow- also note since on Diclofenac, monitor kidney function.  2/25- will check in AM- didn't get drawn this AM   2/26- K+ 3.9 on current repletion- will con't at d/c.   2/27- pt doesn't want larger 11meq tablet---called in rx to pharmacy for 49meq bid.  8.  Acute on chronic diastolic congestive heart failure.  Echocardiogram ejection fraction 50%.  Monitor for any signs of fluid overload.  Continue Demadex 20 mg twice daily  Daily weights.   Filed Weights   03/08/19 0344 03/09/19 0436 03/10/19 0427  Weight: (!) 160.8 kg (!) 161.2 kg (!) 158.3 kg   Stable on 2/22- will con't to monitor ,esp. with diclofenac which can cause some fluid retention for some pts  2/24- weight basically stable at 160.5 kg 9.  Diabetes mellitus with peripheral neuropathy.  Hemoglobin A1c 7.1.    Check blood sugars before meals and at bedtime.  Diabetic teaching   CBG (last 3)  Recent Labs    03/09/19 2019 03/10/19 0000 03/10/19 0406  GLUCAP 228* 120* 180*   Continue insulin pump  Started home Ozempic   2/22- mid 100s- much improved for this pt- con't dose for now.   2/24- 124- 223- MUCH improved- con't regimen  2/25- since have room to go, asked pt to increase insulin pump to level 4- she will do so and will monitor BGs as ordered  2/26- didn't increase yesterday- will do so today  to make sure no hypoglycemia- if does, will revert to earlier dosing.    2/27 some improvement today, further adjustment of pump on outpt basis             Monitor with increased mobility 10.  Cardiogenic shock.  Weaned from pressors.  Monitor for orthostasis.    Decrease Coreg to 6.25 mg BID  11.  Super obesity.  BMI 54.50.  Dietary follow-up.  Encourage weight loss  2/22- BMI down to 52.13- has lost ~ 8+ lbs since admission con't to monitor/encourage weight loss.  12.  Hyperlipidemia.  Crestor 13.  History of anemia: Resolved  Continue iron supplement.   14.  Sickle cell trait.  Follow-up outpatient.  See #13 15. Constipation  Improved  16. L ear popping  Improved 17. Posterior Waist pressure ulcer  2/23- con't wound care in healing.improving ulcer 18. Dispo  2/24- will get pt handicapped placard before d/c on Saturday   Dc home today   LOS: 29 days A FACE TO FACE EVALUATION WAS PERFORMED  Meredith Staggers 03/10/2019, 8:53 AM

## 2019-03-10 NOTE — Plan of Care (Signed)
  Problem: Consults Goal: RH GENERAL PATIENT EDUCATION Description: See Patient Education module for education specifics. Outcome: Completed/Met Goal: Skin Care Protocol Initiated - if Braden Score 18 or less Description: If consults are not indicated, leave blank or document N/A Outcome: Completed/Met   Problem: RH BLADDER ELIMINATION Goal: RH STG MANAGE BLADDER WITH ASSISTANCE Description: STG Manage Bladder With mod Assistance Outcome: Completed/Met   Problem: RH SKIN INTEGRITY Goal: RH STG SKIN FREE OF INFECTION/BREAKDOWN Description: Pt will be free of skin breakdown or pressure ulcers prior to DC with mod assist Outcome: Completed/Met Goal: RH STG MAINTAIN SKIN INTEGRITY WITH ASSISTANCE Description: STG Maintain Skin Integrity With mod Assistance. Outcome: Completed/Met Goal: RH STG ABLE TO PERFORM INCISION/WOUND CARE W/ASSISTANCE Description: STG Able To Perform Incision/Wound Care With total Assistance. Outcome: Completed/Met   Problem: RH SAFETY Goal: RH STG ADHERE TO SAFETY PRECAUTIONS W/ASSISTANCE/DEVICE Description: STG Adhere to Safety Precautions With mod Assistance/Device. Outcome: Completed/Met

## 2019-03-12 NOTE — Progress Notes (Signed)
Social Work Discharge Note   The overall goal for the admission was met for:   Discharge location: Yes. Discharge to daughter's home with 24/7 care from dtr.   Length of Stay: Yes. 29 days  Discharge activity level: Yes. Mod Asst  Home/community participation: Yes. Limited  Services provided included: MD, RD, PT, OT, SLP, RN, CM, TR, Pharmacy, Neuropsych and SW  Financial Services: Medicaid  Follow-up services arranged: Home Health: [facility] for PT/OT/SNA [phone], DME: [place] [phone]- DME itemds: semi electric hospital bed, w/c, TTB, RW, and w/c and Other: PCS referral made to [facility]  Comments (or additional information): Contact pt [phone]  Referral submitted to [employer] for [employer] [phone]. Referral approved. Current approved hours: 60hrs. A referral will be sent to preferred locations in order of preference to see if they have an aide to provide care : 1) [facility], 2) [facility], and 3) [facility]. nursing assessment to follow to determine care needs and number of hours.  Patient/Family verbalized understanding of follow-up arrangements: Yes  Individual responsible for coordination of the follow-up plan: Pt will coordinate d/c needs.      Rana Snare 03/12/2019

## 2019-03-13 ENCOUNTER — Telehealth: Payer: Self-pay | Admitting: Registered Nurse

## 2019-03-13 NOTE — Telephone Encounter (Signed)
Spoke with Kaitlin [name] about diclofenac.  She said the final decision is using the gel only.  I contacted the patient and informed that Dr. Dagoberto Ligas wants her to use the gel and disregard the tablets

## 2019-03-13 NOTE — Telephone Encounter (Signed)
Placed a call to Ms. Orum, no answer. Left message to return the call

## 2019-03-13 NOTE — Telephone Encounter (Signed)
Patient returned your phone--she called at 12:26pm on 03/13/19

## 2019-03-13 NOTE — Telephone Encounter (Signed)
Transitional Care call  Patient name: Kaitlin [name]  DOB: [birth year]  1. Are you/is patient experiencing any problems since coming home? No a. Are there any questions regarding any aspect of care? No 2. Are there any questions regarding medications administration/dosing? No a. Are meds being taken as prescribed? All except Diclofenac tablet, it needs a PA. I will send [name] PA a message regarding the above.  b. "Patient should review meds with caller to confirm" Medication List Reviewed.  3. Have there been any falls? No 4. Has Home Health been to the house and/or have they contacted you? Yes, [facility].  a. If not, have you tried to contact them? NA b. Can we help you contact them? NA 5. Are bowels and bladder emptying properly? Yes a. Are there any unexpected incontinence issues? No b. If applicable, is patient following bowel/bladder programs? NA 6. Any fevers, problems with breathing, unexpected pain? No 7. Are there any skin problems or new areas of breakdown? No 8. Has the patient/family member arranged specialty MD follow up (ie cardiology/neurology/renal/surgical/etc.)?  Ms. Mateus was instructed to call her PCP to schedule HFU appointment, she verbalizes understanding.  a. Can we help arrange? No 9. Does the patient need any other services or support that we can help arrange? No 10. Are caregivers following through as expected in assisting the patient? Yes 11. Has the patient quit smoking, drinking alcohol, or using drugs as recommended? (                        )  Appointment date/time 03/23/2019  arrival time 10:20 for 10:40 appointment with Dr. Dagoberto Ligas. At [facility]

## 2019-03-22 ENCOUNTER — Ambulatory Visit
Admission: RE | Admit: 2019-03-22 | Discharge: 2019-03-22 | Disposition: A | Discharge status: Home / Self Care | Payer: Medicaid Other | Source: Ambulatory Visit | Attending: Physician Assistant | Admitting: Physician Assistant

## 2019-03-22 ENCOUNTER — Other Ambulatory Visit: Payer: Self-pay

## 2019-03-22 DIAGNOSIS — Z1231 Encounter for screening mammogram for malignant neoplasm of breast: Secondary | ICD-10-CM

## 2019-03-23 ENCOUNTER — Encounter: Payer: Self-pay | Admitting: Physical Medicine and Rehabilitation

## 2019-03-23 ENCOUNTER — Encounter
Payer: Medicaid Other | Attending: Physical Medicine and Rehabilitation | Admitting: Physical Medicine and Rehabilitation

## 2019-03-23 VITALS — BP 102/66 | HR 92 | Temp 97.7°F | Ht 69.0 in | Wt 364.0 lb

## 2019-03-23 DIAGNOSIS — M25512 Pain in left shoulder: Secondary | ICD-10-CM

## 2019-03-23 DIAGNOSIS — E1165 Type 2 diabetes mellitus with hyperglycemia: Secondary | ICD-10-CM | POA: Diagnosis present

## 2019-03-23 DIAGNOSIS — G7281 Critical illness myopathy: Secondary | ICD-10-CM | POA: Diagnosis not present

## 2019-03-23 DIAGNOSIS — S43111S Subluxation of right acromioclavicular joint, sequela: Secondary | ICD-10-CM | POA: Diagnosis present

## 2019-03-23 DIAGNOSIS — E1142 Type 2 diabetes mellitus with diabetic polyneuropathy: Secondary | ICD-10-CM | POA: Diagnosis not present

## 2019-03-23 DIAGNOSIS — R202 Paresthesia of skin: Secondary | ICD-10-CM | POA: Diagnosis not present

## 2019-03-23 MED ORDER — DICLOFENAC SODIUM 50 MG PO TBEC: 50.0000 mg | DELAYED_RELEASE_TABLET | Freq: Every day | ORAL | 1 refills | Status: DC

## 2019-03-23 NOTE — Patient Instructions (Signed)
Patient is a 53 yr old female with ICU myopathy s/p COVID- with IDDM- on pump, on Torsemide; B/L shoulder pain; here for f/u.     1. Will restart Diclofenac 50 mg daily. Not BID- since DM, don't want ot do BID or high doses but it's better than doing steroid injection into shoulders which would cause BGs to rise too much.    2. If makes it to 6 months and fatigue is still an issue- Suggest an ECHO if doesn't improve by the 6 months mark. So in 3 months, if not better.   3. Can look into reach to wipe herself- from [place]. Butt buddy usually.    4. Don't drive until can get in and out of car on own.   5. F/U  In 8 weeks.

## 2019-03-23 NOTE — Progress Notes (Signed)
Subjective:    Patient ID: Kaitlin [name], female    DOB: May 05, 1965, 54 y.o.   MRN: [id number]  HPI    Still some of the same issues- tired a lot-  Still having pain in shoulders/upper arms- constant. No activities make it worse- same no matter what.   Was told couldn't do Diclofenac pill and gel- so did gel.   Fatigue- is still a big issue.    Better functionally- can get to bathroom by self- can't reach to wipe completely yet.   Is exhausting just getting dressed and bathing -   Is walking around apartment- and walking to car- to handicapped space.   Sits more than walks. But goes to bathroom a lot.   When takes fluid pill, needs to get bathroom frequently.   Going to see OT on Monday and still working on H/H aide but hasn't seen PT yet.   Would like to drive.  Needs assistance getting in and out of car.   Stays hot all the time- and has burning in LE's.       Pain Inventory Average Pain 5 Pain Right Now 7 My pain is burning and aching  In the last 24 hours, has pain interfered with the following? General activity 6 Relation with others 0 Enjoyment of life 5 What TIME of day is your pain at its worst? evening,night  Sleep (in general) Fair  Pain is worse with: walking, sitting and standing Pain improves with: rest, therapy/exercise, medication and other Relief from Meds: 6  Mobility walk with assistance use a walker ability to climb steps?  no do you drive?  no use a wheelchair needs help with transfers Do you have any goals in this area?  yes  Function disabled: date disabled . I need assistance with the following:  dressing, bathing, toileting, meal prep, household duties and shopping Do you have any goals in this area?  yes  Neuro/Psych bladder control problems weakness numbness trouble walking spasms dizziness anxiety  Prior Studies transitional care  Physicians involved in your care transitional care   Family History    Problem Relation Age of Onset  . Heart disease Father        No details  . Diabetes type II Other        Family HX  . Breast cancer Other    Social History   Socioeconomic History  . Marital status: Married    Spouse name: Not on file  . Number of children: 3  . Years of education: 7  . Highest education level: Not on file  Occupational History  . Occupation: Unemployed  Tobacco Use  . Smoking status: Passive Smoke Exposure - Never Smoker  . Smokeless tobacco: Never Used  Substance and Sexual Activity  . Alcohol use: No  . Drug use: No  . Sexual activity: Yes    Birth control/protection: Surgical  Other Topics Concern  . Not on file  Social History Narrative   Lives at home with mother, husband and son   Caffeine use: none   Social Determinants of Health   Financial Resource Strain:   . Difficulty of Paying Living Expenses:   Food Insecurity:   . Worried About Charity fundraiser in the Last Year:   . Arboriculturist in the Last Year:   Transportation Needs:   . Film/video editor (Medical):   Marland Kitchen Lack of Transportation (Non-Medical):   Physical Activity:   . Days of Exercise  per Week:   . Minutes of Exercise per Session:   Stress:   . Feeling of Stress :   Social Connections:   . Frequency of Communication with Friends and Family:   . Frequency of Social Gatherings with Friends and Family:   . Attends Religious Services:   . Active Member of Clubs or Organizations:   . Attends Archivist Meetings:   Marland Kitchen Marital Status:    Past Surgical History:  Procedure Laterality Date  . COLONOSCOPY N/A 06/21/2017   Procedure: COLONOSCOPY;  Surgeon: Ronnette Juniper, MD;  Location: [facility];  Service: Gastroenterology;  Laterality: N/A;  . LEFT HEART CATHETERIZATION WITH CORONARY ANGIOGRAM N/A 05/25/2013   Procedure: LEFT HEART CATHETERIZATION WITH CORONARY ANGIOGRAM;  Surgeon: Troy Sine, MD;  Location: [facility] CATH LAB;  Service: Cardiovascular;  Laterality:  N/A;  . LYMPH NODE BIOPSY Right 11/30/2012   Procedure: RIGHT TONSIL BIOPSY WITH FRESH FROZEN ANALYSIS;  Surgeon: Jodi Marble, MD;  Location: [place];  Service: ENT;  Laterality: Right;  . MEDIASTERNOTOMY N/A 01/12/2013   Procedure: MEDIAN STERNOTOMY;  Surgeon: Gaye Pollack, MD;  Location: [place];  Service: Thoracic;  Laterality: N/A;  . MEDIASTINOTOMY CHAMBERLAIN MCNEIL Right 11/06/2012   Procedure: MEDIASTINOTOMY CHAMBERLAIN MCNEILPROCEDURE;  Surgeon: Gaye Pollack, MD;  Location: [place];  Service: Thoracic;  Laterality: Right;  . POLYPECTOMY  06/21/2017   Procedure: POLYPECTOMY;  Surgeon: Ronnette Juniper, MD;  Location: [facility];  Service: Gastroenterology;;  . RESECTION OF A THYMOMA N/A 01/12/2013   Procedure: RESECTION OF A THYMOMA;  Surgeon: Gaye Pollack, MD;  Location: [place];  Service: Thoracic;  Laterality: N/A;  . TONSILLECTOMY    . TUBAL LIGATION     Past Medical History:  Diagnosis Date  . Anemia   . Bronchitis   . Cataracts, bilateral 01/12/2018  . CHF (congestive heart failure) ([place])   . Diabetes mellitus   . High cholesterol   . Hypertension    takes medicine to protect kidneys, does not have HTN  . Leukocytosis   . Morbid obesity ([place]) 09/11/2011   BMI 57  . Neuropathy 01/12/2018  . NICM (nonischemic cardiomyopathy) ([place]) 05/01/2013   Overview:  Last Assessment & Plan:  euvolemic on physical exam.   . Obesity   . Sickle cell trait ([place])   . 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  HENT: Negative.   Eyes: Negative.   Respiratory: Negative.   Cardiovascular: Negative.   Gastrointestinal: Negative.   Endocrine:       High blood sugar  Genitourinary: Negative.   Musculoskeletal: Positive for arthralgias, gait problem and neck pain.       Spasms   Skin: Negative.   Allergic/Immunologic: Negative.   Neurological: Positive for dizziness, weakness and numbness.  Hematological: Negative.   Psychiatric/Behavioral: The patient is nervous/anxious.   All other systems reviewed and are negative.  Objective:   Physical Exam  Awake, alert, appropriate, sitting up in manual w/c, accompanied by favorite daughter, NAD Subluxations noted in shoulders ~1cm B/L Very TTP over distal deltoid B/L  MS: Deltoids 2+/5, biceps 4-/5, triceps 4/5, WE 4/5, grip 4+/5, finger abd 4+/5  LEs- HF 4/5, KE 4+/5, DF and PF 5-/5  Neuro: LT intact in all 4 extrmities-        Assessment & Plan:  Patient is a 54 yr old female with ICU myopathy s/p COVID- with IDDM- on pump, on Torsemide; B/L shoulder pain; here for f/u.     1. Will restart Diclofenac 50 mg daily. Not BID- since DM, don't want ot do BID or high doses but it's better than doing steroid injection into shoulders which would cause BGs to rise too much.    2. If makes it to 6 months and fatigue is still an issue- Suggest an ECHO if doesn't improve by the 6 months mark. So in 3 months, if not better.   3. Can look into reach to wipe herself- from [place]. Butt buddy usually.    4. Don't drive until can get in and out of car on own.   5. F/U  In 8 weeks.   I have spent a total of 25 minutes on appointment- more than 15 minutes educating pt as discussed above.

## 2019-05-16 ENCOUNTER — Encounter: Payer: Self-pay | Admitting: Physical Medicine and Rehabilitation

## 2019-05-16 ENCOUNTER — Other Ambulatory Visit: Payer: Self-pay

## 2019-05-16 ENCOUNTER — Encounter
Payer: Medicaid Other | Attending: Physical Medicine and Rehabilitation | Admitting: Physical Medicine and Rehabilitation

## 2019-05-16 VITALS — BP 112/81 | HR 96 | Temp 97.5°F | Ht 69.0 in | Wt 370.6 lb

## 2019-05-16 DIAGNOSIS — G7281 Critical illness myopathy: Secondary | ICD-10-CM | POA: Diagnosis not present

## 2019-05-16 DIAGNOSIS — E1142 Type 2 diabetes mellitus with diabetic polyneuropathy: Secondary | ICD-10-CM | POA: Diagnosis present

## 2019-05-16 DIAGNOSIS — M7918 Myalgia, other site: Secondary | ICD-10-CM | POA: Diagnosis not present

## 2019-05-16 DIAGNOSIS — R202 Paresthesia of skin: Secondary | ICD-10-CM | POA: Diagnosis present

## 2019-05-16 DIAGNOSIS — S43111S Subluxation of right acromioclavicular joint, sequela: Secondary | ICD-10-CM | POA: Insufficient documentation

## 2019-05-16 DIAGNOSIS — E1165 Type 2 diabetes mellitus with hyperglycemia: Secondary | ICD-10-CM | POA: Diagnosis present

## 2019-05-16 DIAGNOSIS — M25512 Pain in left shoulder: Secondary | ICD-10-CM | POA: Diagnosis present

## 2019-05-16 DIAGNOSIS — M792 Neuralgia and neuritis, unspecified: Secondary | ICD-10-CM

## 2019-05-16 NOTE — Progress Notes (Signed)
Subjective:    Patient ID: Kaitlin [name], female    DOB: 1965-07-31, 54 y.o.   MRN: [id number]  HPI  Patient is a 54 yr old female with DM with insulin pump s/p ICU myopathy and neuropathy here for f/u.    Still having issues with shoulders- still cannot get abduction/flexion- above head that had prior, but better than first started.   Also still having pain.  For the most part, pain controlled- sometimes out of hand.    Doing Diclofenac and tylenol.   Doesn't want to take anymore pills for pain.    When at home- when going to  bathroom, or fast trips,    Having pain in chest- did EKG and was fine.  On right side of chest- not left side- to see cards for it, but sounds like muscle spasms- tight.  Wants to know if can drive yet.    H/H PT and OT said she she still has limitations, but doesn't need H/H anymore.   Asking about personal care aide-  3 hours/day.  Helps her with bathing, laundry, household cleaning, exercises.  Has had no service 1/2 the time when they aren't there.   Also fatigued not as bad as hospital but still a big issue.  Tried driving on Sunday- did well.     Pain Inventory Average Pain 6 Pain Right Now 4 My pain is burning, tingling and aching  In the last 24 hours, has pain interfered with the following? General activity 5 Relation with others 5 Enjoyment of life 5 What TIME of day is your pain at its worst? evening and night Sleep (in general) Fair  Pain is worse with: some activites Pain improves with: rest, therapy/exercise and medication Relief from Meds: 5  Mobility walk with assistance use a walker how many minutes can you walk? 3-5 ability to climb steps?  no do you drive?  no use a wheelchair transfers alone  Function not employed: date last employed . I need assistance with the following:  dressing, bathing, meal prep, household duties and shopping  Neuro/Psych bladder control problems numbness tingling  Prior  Studies Any changes since last visit?  no  Physicians involved in your care Any changes since last visit?  no   Family History  Problem Relation Age of Onset  . Heart disease Father        No details  . Diabetes type II Other        Family HX  . Breast cancer Other    Social History   Socioeconomic History  . Marital status: Married    Spouse name: Not on file  . Number of children: 3  . Years of education: 30  . Highest education level: Not on file  Occupational History  . Occupation: Unemployed  Tobacco Use  . Smoking status: Passive Smoke Exposure - Never Smoker  . Smokeless tobacco: Never Used  Substance and Sexual Activity  . Alcohol use: No  . Drug use: No  . Sexual activity: Yes    Birth control/protection: Surgical  Other Topics Concern  . Not on file  Social History Narrative   Lives at home with mother, husband and son   Caffeine use: none   Social Determinants of Health   Financial Resource Strain:   . Difficulty of Paying Living Expenses:   Food Insecurity:   . Worried About Charity fundraiser in the Last Year:   . [place] in the Last  Year:   Transportation Needs:   . Film/video editor (Medical):   Marland Kitchen Lack of Transportation (Non-Medical):   Physical Activity:   . Days of Exercise per Week:   . Minutes of Exercise per Session:   Stress:   . Feeling of Stress :   Social Connections:   . Frequency of Communication with Friends and Family:   . Frequency of Social Gatherings with Friends and Family:   . Attends Religious Services:   . Active Member of Clubs or Organizations:   . Attends Archivist Meetings:   Marland Kitchen Marital Status:    Past Surgical History:  Procedure Laterality Date  . COLONOSCOPY N/A 06/21/2017   Procedure: COLONOSCOPY;  Surgeon: Ronnette Juniper, MD;  Location: [facility];  Service: Gastroenterology;  Laterality: N/A;  . LEFT HEART CATHETERIZATION WITH CORONARY ANGIOGRAM N/A 05/25/2013   Procedure: LEFT HEART  CATHETERIZATION WITH CORONARY ANGIOGRAM;  Surgeon: Troy Sine, MD;  Location: [facility] CATH LAB;  Service: Cardiovascular;  Laterality: N/A;  . LYMPH NODE BIOPSY Right 11/30/2012   Procedure: RIGHT TONSIL BIOPSY WITH FRESH FROZEN ANALYSIS;  Surgeon: Jodi Marble, MD;  Location: [place];  Service: ENT;  Laterality: Right;  . MEDIASTERNOTOMY N/A 01/12/2013   Procedure: MEDIAN STERNOTOMY;  Surgeon: Gaye Pollack, MD;  Location: [place];  Service: Thoracic;  Laterality: N/A;  . MEDIASTINOTOMY CHAMBERLAIN MCNEIL Right 11/06/2012   Procedure: MEDIASTINOTOMY CHAMBERLAIN MCNEILPROCEDURE;  Surgeon: Gaye Pollack, MD;  Location: [place];  Service: Thoracic;  Laterality: Right;  . POLYPECTOMY  06/21/2017   Procedure: POLYPECTOMY;  Surgeon: Ronnette Juniper, MD;  Location: [facility];  Service: Gastroenterology;;  . RESECTION OF A THYMOMA N/A 01/12/2013   Procedure: RESECTION OF A THYMOMA;  Surgeon: Gaye Pollack, MD;  Location: [place];  Service: Thoracic;  Laterality: N/A;  . TONSILLECTOMY    . TUBAL LIGATION     Past Medical History:  Diagnosis Date  . Anemia   . Bronchitis   . Cataracts, bilateral 01/12/2018  . CHF (congestive heart failure) ([place])   . Diabetes mellitus   . High cholesterol   . Hypertension    takes medicine to protect kidneys, does not have HTN  . Leukocytosis   . Morbid obesity ([place]) 09/11/2011   BMI 57  . Neuropathy 01/12/2018  . NICM (nonischemic cardiomyopathy) ([place]) 05/01/2013   Overview:  Last Assessment & Plan:  euvolemic on physical exam.   . Obesity   . Sickle cell trait ([place])   . 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/81   Pulse 96   Temp (!) 97.5 F (36.4 C)   Ht 5\' 9"  (1.753 m)   Wt (!) 370 lb 9.6 oz (168.1 kg)   LMP 06/17/2012   SpO2 94%   BMI 54.73 kg/m   Opioid Risk Score:   Fall Risk Score:  `1  Depression screen PHQ 2/9  Depression screen PHQ 2/9 03/23/2019  Decreased Interest 0  Down, Depressed, Hopeless 0  PHQ - 2 Score 0  Altered sleeping 2  Tired, decreased energy 3   Change in appetite 0  Feeling bad or failure about yourself  0  Trouble concentrating 1  Moving slowly or fidgety/restless 0  Suicidal thoughts 0  PHQ-9 Score 6  Difficult doing work/chores Somewhat difficult  Some recent data might be hidden   Review of Systems  Constitutional: Positive for fever.  Respiratory: Positive for shortness of breath.   Neurological: Positive for numbness.       Tingling  All other systems reviewed and are negative.      Objective:   Physical Exam Awake, alert, appropriate, has RW, NAD No Cranial nerve deficits   MS: UEs- deltoid 2/5, biceps 4/5, triceps 4/5, WE 4+/5 on R 4/5 on L; grip 4+/5, finger abd 4+/5 LEs- HF 4-/5, KE 4/5, KF 4/5, DF 4+/5, PF 4+/5- almost 5-/5 in ankles/feet  Tight musculature in R pec, B/L upper traps and levators and scalenes B/L- palpable trigger points  Neuro: B/L C5 sensory reduction to light touch, however C6-T1 is intact to light touch B/L Intact to light touch in LEs.  B/L         Assessment & Plan:   Patient is a 54 yr old female with DM with insulin pump s/p ICU myopathy and neuropathy here for f/u.   1. ICU myopathy- shoulders usually lag behind other muscles getting back stronger.   2. Wants to wait on doing meds stronger than Diclofenac at this time.  3. Outpatient PT and OT- for ICU myopathy and neuropathy. Wrote referrals  4. Patient here for trigger point injections for myofascial pain  Consent done and on chart.  Cleaned areas with alcohol and injected using a 27 gauge 1.5 inch needle  Injected 2cc Using 1% Lidocaine with no EPI  Upper traps B/L Levators B/L Posterior scalenes Middle scalenes Splenius Capitus Pectoralis Major R only Rhomboids Infraspinatus Teres Major/minor Thoracic paraspinals Lumbar paraspinals Other injections-    Patient's level of pain prior was 5-6/10 Current level of pain after injections is 4/10 with more ROM  There was no bleeding or  complications.  Patient was advised to drink a lot of water on day after injections to flush system Will have increased soreness for 12-48 hours after injections.  Can use Lidocaine patches the day AFTER injections Can use theracane on day of injections in places didn't inject Can use heating pad 4-6 hours AFTER injections  5. Strong enough in legs to drive-   6. Tennis ball- hold firm pressure 2-4 minutes- can use youtube to figure out how to use the tennis balls- trigger points  7. Aspercreme, lidocaine 4% cream- over the counter-  to upper shoulders up to 4x/day as needed- for super sensitivity of tissue.   8. F/U in 6 weeks   I spent a total of 45 minutes on appointment- as detailed above.

## 2019-05-16 NOTE — Patient Instructions (Signed)
Patient is a 54 yr old female with DM with insulin pump s/p ICU myopathy and neuropathy here for f/u.   1. ICU myopathy- shoulders usually lag behind other muscles getting back stronger.   2. Wants to wait on doing meds stronger than Diclofenac at this time.  3. Outpatient PT and OT- for ICU myopathy and neuropathy. Wrote referrals  4. Patient here for trigger point injections for myofascial pain  Consent done and on chart.  Cleaned areas with alcohol and injected using a 27 gauge 1.5 inch needle  Injected 2cc Using 1% Lidocaine with no EPI  Upper traps B/L Levators B/L Posterior scalenes Middle scalenes Splenius Capitus Pectoralis Major R only Rhomboids Infraspinatus Teres Major/minor Thoracic paraspinals Lumbar paraspinals Other injections-    Patient's level of pain prior was 5-6/10 Current level of pain after injections is 4/10 with more ROM  There was no bleeding or complications.  Patient was advised to drink a lot of water on day after injections to flush system Will have increased soreness for 12-48 hours after injections.  Can use Lidocaine patches the day AFTER injections Can use theracane on day of injections in places didn't inject Can use heating pad 4-6 hours AFTER injections  5. Strong enough in legs to drive-   6. Tennis ball- hold firm pressure 2-4 minutes- can use youtube to figure out how to use the tennis balls- trigger points  7. Aspercreme, lidocaine 4% cream- over the counter-  to upper shoulders up to 4x/day as needed- for super sensitivity of tissue.   8. F/U in 6 weeks

## 2019-05-30 ENCOUNTER — Telehealth: Payer: Self-pay | Admitting: Physical Medicine and Rehabilitation

## 2019-05-30 NOTE — Telephone Encounter (Signed)
Patient is needing a prescription for incontinence supplies.  She said that a company had sent some paperwork about this but hasn't heard anything back yet.  Please call patient.

## 2019-05-30 NOTE — Telephone Encounter (Signed)
I spoke with Dr. Dagoberto Ligas and she hasn't seen anything on this patient for incontinence supplies.  I have called patient to tell her we haven't gotten anything for this.  I have given her our correct fax number and she will call company.

## 2019-06-05 ENCOUNTER — Other Ambulatory Visit: Payer: Self-pay | Admitting: Physical Medicine and Rehabilitation

## 2019-06-20 ENCOUNTER — Telehealth: Payer: Self-pay

## 2019-06-20 NOTE — Telephone Encounter (Signed)
[facility]CD called and state medical necessity letter for incontinence supplies still missing ICD 10 ICD10 for incontinence date of onset etc.  - they are refaxing it now.  Return fax to [phone] Id [id number] FROM [id number]

## 2019-06-21 ENCOUNTER — Telehealth: Payer: Self-pay

## 2019-06-21 NOTE — Telephone Encounter (Signed)
Just fyi- I've attempted to fill out their paperwork 5x now and they keep saying something is missing and aren't clear- I will try for the 6th time- ask Doristine Church!

## 2019-06-21 NOTE — Telephone Encounter (Signed)
Patient called about papers to be filled out from [place] for incontinence material. Have you seen this?

## 2019-06-28 ENCOUNTER — Telehealth: Payer: Self-pay

## 2019-06-28 NOTE — Telephone Encounter (Signed)
Medicaid  (HDIS) and the ptn  Called   [id number]  regarding missing date of onset and diagnosis code on form and cmn documents (two different locations) called ptn to advise form is here waiting for MD to return - voicemail full - called [phone] mcd and left voicemail and spoke with agent who said he would also document --

## 2019-06-28 NOTE — Telephone Encounter (Signed)
Adah called back for an update. She is aware that Dr. Dagoberto Ligas is out the office. And will look at the forms next week.

## 2019-06-29 ENCOUNTER — Other Ambulatory Visit: Payer: Self-pay | Admitting: Physical Medicine and Rehabilitation

## 2019-07-03 NOTE — Telephone Encounter (Signed)
Form was faxed with Dx code updated. Message left on Medicaid voicemail to call back if more information is needed.   Kaitlin [name] has been updated on the progress.

## 2019-07-03 NOTE — Telephone Encounter (Signed)
I talked to Kaitlin [name] and filled out paperwork AGAIN- for 4-5th time- and hopefully have done what they need, since they are never clear what's missing.   Please let pt know.

## 2019-07-04 ENCOUNTER — Encounter: Payer: Self-pay | Admitting: Physical Medicine and Rehabilitation

## 2019-07-04 ENCOUNTER — Other Ambulatory Visit: Payer: Self-pay

## 2019-07-04 ENCOUNTER — Encounter
Payer: Medicaid Other | Attending: Physical Medicine and Rehabilitation | Admitting: Physical Medicine and Rehabilitation

## 2019-07-04 VITALS — BP 112/78 | HR 86 | Temp 97.7°F | Ht 69.0 in | Wt 371.6 lb

## 2019-07-04 DIAGNOSIS — M7918 Myalgia, other site: Secondary | ICD-10-CM | POA: Diagnosis not present

## 2019-07-04 DIAGNOSIS — N3946 Mixed incontinence: Secondary | ICD-10-CM

## 2019-07-04 DIAGNOSIS — R202 Paresthesia of skin: Secondary | ICD-10-CM | POA: Insufficient documentation

## 2019-07-04 DIAGNOSIS — G7281 Critical illness myopathy: Secondary | ICD-10-CM | POA: Insufficient documentation

## 2019-07-04 DIAGNOSIS — S43111S Subluxation of right acromioclavicular joint, sequela: Secondary | ICD-10-CM | POA: Diagnosis present

## 2019-07-04 DIAGNOSIS — M25512 Pain in left shoulder: Secondary | ICD-10-CM | POA: Diagnosis present

## 2019-07-04 DIAGNOSIS — E1165 Type 2 diabetes mellitus with hyperglycemia: Secondary | ICD-10-CM | POA: Insufficient documentation

## 2019-07-04 DIAGNOSIS — E1142 Type 2 diabetes mellitus with diabetic polyneuropathy: Secondary | ICD-10-CM | POA: Insufficient documentation

## 2019-07-04 MED ORDER — DICLOFENAC SODIUM 50 MG PO TBEC: DELAYED_RELEASE_TABLET | ORAL | 5 refills | Status: DC

## 2019-07-04 NOTE — Patient Instructions (Addendum)
Plan: ICU myopathy  1. Patient here for trigger point injections for  Consent done and on chart.  Cleaned areas with alcohol and injected using a 27 gauge 1.5 inch needle  Injected 4.5 cc Using 1% Lidocaine with no EPI  Upper traps B/L Levators B/L Posterior scalenes B/L Middle scalenes Splenius Capitus B/L Pectoralis Major B/L Rhomboids- B/L x2 Infraspinatus Teres Major/minor Thoracic paraspinals Lumbar paraspinals Other injections-    Patient's level of pain prior was- 4/10 Current level of pain after injections is- 2-3/10- better- feels better than last time, even.   There was no bleeding or complications.  Patient was advised to drink a lot of water on day after injections to flush system Will have increased soreness for 12-48 hours after injections.  Can use Lidocaine patches the day AFTER injections Can use theracane on day of injections in places didn't inject Can use heating pad 4-6 hours AFTER injections   2. Diclofenac- 50 mg daily- as needed for shoulder pain.  5 RFs.  3. Filled out incontinence supplies paperwork for 6th time.   4. Will get pt phone number from front- for [facility] therapy- and send pt asap.to PT/and OT- since placed referrals last visit- 2 months ago.     5. F/U in 8 weeks. For trigger point injections   6. Doesn't appear by reading ECHO to have long COVID cardiac issues.  Suggest PFTs- pulmonary function tests if still like this 6 months out from hospital.
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
## 2019-07-09 NOTE — Telephone Encounter (Signed)
Kaitlin [name] called for an update on her HDIS form, for medical supplies.   Today form  has been re-faxed  with medical note.   Patient informed. She was advised to call back if more information is needed.

## 2019-07-30 ENCOUNTER — Other Ambulatory Visit: Payer: Self-pay | Admitting: Physical Medicine and Rehabilitation

## 2019-08-29 ENCOUNTER — Encounter
Payer: Medicaid Other | Attending: Physical Medicine and Rehabilitation | Admitting: Physical Medicine and Rehabilitation

## 2019-08-29 ENCOUNTER — Encounter: Payer: Self-pay | Admitting: Physical Medicine and Rehabilitation

## 2019-08-29 ENCOUNTER — Other Ambulatory Visit: Payer: Self-pay

## 2019-08-29 VITALS — BP 132/75 | HR 103 | Temp 98.1°F | Ht 69.0 in | Wt 382.0 lb

## 2019-08-29 DIAGNOSIS — M7918 Myalgia, other site: Secondary | ICD-10-CM

## 2019-08-29 DIAGNOSIS — N3946 Mixed incontinence: Secondary | ICD-10-CM | POA: Diagnosis not present

## 2019-08-29 DIAGNOSIS — G7281 Critical illness myopathy: Secondary | ICD-10-CM | POA: Diagnosis not present

## 2019-08-29 DIAGNOSIS — E1142 Type 2 diabetes mellitus with diabetic polyneuropathy: Secondary | ICD-10-CM | POA: Diagnosis present

## 2019-08-29 DIAGNOSIS — M25512 Pain in left shoulder: Secondary | ICD-10-CM | POA: Diagnosis present

## 2019-08-29 DIAGNOSIS — S43111S Subluxation of right acromioclavicular joint, sequela: Secondary | ICD-10-CM | POA: Insufficient documentation

## 2019-08-29 DIAGNOSIS — R202 Paresthesia of skin: Secondary | ICD-10-CM | POA: Insufficient documentation

## 2019-08-29 DIAGNOSIS — E1165 Type 2 diabetes mellitus with hyperglycemia: Secondary | ICD-10-CM | POA: Diagnosis present

## 2019-08-29 NOTE — Progress Notes (Signed)
     Liked trigger point injections-  Lasted until 2 weeks ago- so lasted ~ 6 weeks.    Still needs incontinence products- hard to get ot bathroom  Fast enough- has "gotta go, gotta go" issue- can't get there fast enough.   Did get 1 shipment.   Didn't get in with therapy- didn't hear from them- Didn't call them either-    Arms still real weak- pretty weak-  Reaching to get things 90 degrees- putting on deodorant, doing hair, showering etc.  Also exhausting to walk Walks into building from car without RW, but exhausted- needs to build endurance DROVE today!- YEA!  Thinks could walk 359ft at max. Might need to stop during that walk, though   After getting into room, HR was 103- so elevated.    Everything is still exhausting for her.  Every task-   Taking Diclofenac -takes every day- 1x/day- as needed for shoulder pain.   Lost PCS services finally Just stopped after 7/4 Didn't have anyone to come.       Plan: 1. Will fill out paperwork to renew incontinence supplies. Still needs it.   2.  Patient here for trigger point injections for  Consent done and on chart.  Cleaned areas with alcohol and injected using a 27 gauge 1.5 inch needle  Injected 6cc  Using 1% Lidocaine with no EPI  Upper traps B/L Levators B/L Posterior scalenes b/L Middle scalenes Splenius Capitus Pectoralis Major R only Rhomboids 3 on L 2 on R Infraspinatus Teres Major/minor Thoracic paraspinals Lumbar paraspinals Other injections-    Patient's level of pain prior was- 3/10 Current level of pain after injections is- still about 3/10- usually better by the time she leaves/gets home.   There was no bleeding or complications.  Patient was advised to drink a lot of water on day after injections to flush system Will have increased soreness for 12-48 hours after injections.  Can use Lidocaine patches the day AFTER injections Can use theracane on day of injections in places didn't  inject Can use heating pad/ice 4-6 hours AFTER injections  3. Still using w/c placard- doesn't need it renewed.   4. Still having issues from ICU myopathy- will send again to PT and OT- make referral-3rd time- hopefully can get her in.    5. F/U in 6 weeks. For injections  I spent a total 30 minutes on visit- as detailed above.

## 2019-08-29 NOTE — Patient Instructions (Signed)
Plan: 1. Will fill out paperwork to renew incontinence supplies. Still needs it.   2.  Patient here for trigger point injections for  Consent done and on chart.  Cleaned areas with alcohol and injected using a 27 gauge 1.5 inch needle  Injected 6cc  Using 1% Lidocaine with no EPI  Upper traps B/L Levators B/L Posterior scalenes b/L Middle scalenes Splenius Capitus Pectoralis Major R only Rhomboids 3 on L 2 on R Infraspinatus Teres Major/minor Thoracic paraspinals Lumbar paraspinals Other injections-    Patient's level of pain prior was- 3/10 Current level of pain after injections is- still about 3/10- usually better by the time she leaves/gets home.   There was no bleeding or complications.  Patient was advised to drink a lot of water on day after injections to flush system Will have increased soreness for 12-48 hours after injections.  Can use Lidocaine patches the day AFTER injections Can use theracane on day of injections in places didn't inject Can use heating pad/ice 4-6 hours AFTER injections  3. Still using w/c placard- doesn't need it renewed.   4. Still having issues from ICU myopathy- will send again to PT and OT- make referral-3rd time- hopefully can get her in.    5. F/U in 6 weeks. For injections

## 2019-09-04 ENCOUNTER — Ambulatory Visit: Payer: Medicaid Other | Attending: Physical Medicine and Rehabilitation | Admitting: Physical Therapy

## 2019-09-04 ENCOUNTER — Other Ambulatory Visit: Payer: Self-pay

## 2019-09-04 VITALS — BP 124/83 | HR 87

## 2019-09-04 DIAGNOSIS — M6281 Muscle weakness (generalized): Secondary | ICD-10-CM | POA: Insufficient documentation

## 2019-09-04 DIAGNOSIS — R2681 Unsteadiness on feet: Secondary | ICD-10-CM | POA: Insufficient documentation

## 2019-09-04 DIAGNOSIS — R2689 Other abnormalities of gait and mobility: Secondary | ICD-10-CM | POA: Diagnosis present

## 2019-09-04 DIAGNOSIS — R262 Difficulty in walking, not elsewhere classified: Secondary | ICD-10-CM | POA: Insufficient documentation

## 2019-09-04 NOTE — Therapy (Signed)
[facility] [address] [place], [place], [address] Phone: [phone]   Fax:  [phone]  Physical Therapy Evaluation  Patient Details  Name: Kaitlin [name] MRN: [id number] Date of Birth: 1965/03/27 Referring Provider (PT): Dr. Courtney Heys    Encounter Date: 09/04/2019   PT End of Session - 09/04/19 1152    Visit Number 1    Number of Visits 13    Date for PT Re-Evaluation --   awaiting Medicaid auth   Authorization Type Medicaid - awaiting auth    PT Start Time 1103    PT Stop Time 1146    PT Time Calculation (min) 43 min    Equipment Utilized During Treatment Gait belt    Activity Tolerance Patient tolerated treatment well;Patient limited by fatigue    Behavior During Therapy [facility] for tasks assessed/performed           Past Medical History:  Diagnosis Date   Anemia    Bronchitis    Cataracts, bilateral 01/12/2018   CHF (congestive heart failure) ([place])    Diabetes mellitus    High cholesterol    Hypertension    takes medicine to protect kidneys, does not have HTN   Leukocytosis    Morbid obesity ([place]) 09/11/2011   BMI 57   Neuropathy 01/12/2018   NICM (nonischemic cardiomyopathy) ([place]) 05/01/2013   Overview:  Last Assessment & Plan:  euvolemic on physical exam.    Obesity    Sickle cell trait ([place])    Thymoma     Past Surgical History:  Procedure Laterality Date   COLONOSCOPY N/A 06/21/2017   Procedure: COLONOSCOPY;  Surgeon: Ronnette Juniper, MD;  Location: [facility]OSCOPY;  Service: Gastroenterology;  Laterality: N/A;   LEFT HEART CATHETERIZATION WITH CORONARY ANGIOGRAM N/A 05/25/2013   Procedure: LEFT HEART CATHETERIZATION WITH CORONARY ANGIOGRAM;  Surgeon: Troy Sine, MD;  Location: [facility] CATH LAB;  Service: Cardiovascular;  Laterality: N/A;   LYMPH NODE BIOPSY Right 11/30/2012   Procedure: RIGHT TONSIL BIOPSY WITH FRESH FROZEN ANALYSIS;  Surgeon: Jodi Marble, MD;  Location: [facility];   Service: ENT;  Laterality: Right;   MEDIASTERNOTOMY N/A 01/12/2013   Procedure: MEDIAN STERNOTOMY;  Surgeon: Gaye Pollack, MD;  Location: [facility];  Service: Thoracic;  Laterality: N/A;   MEDIASTINOTOMY CHAMBERLAIN MCNEIL Right 11/06/2012   Procedure: MEDIASTINOTOMY CHAMBERLAIN MCNEILPROCEDURE;  Surgeon: Gaye Pollack, MD;  Location: [facility];  Service: Thoracic;  Laterality: Right;   POLYPECTOMY  06/21/2017   Procedure: POLYPECTOMY;  Surgeon: Ronnette Juniper, MD;  Location: [facility];  Service: Gastroenterology;;   RESECTION OF A THYMOMA N/A 01/12/2013   Procedure: RESECTION OF A THYMOMA;  Surgeon: Gaye Pollack, MD;  Location: [facility];  Service: Thoracic;  Laterality: N/A;   TONSILLECTOMY     TUBAL LIGATION      Vitals:   09/04/19 1115  BP: 124/83  Pulse: 87  SpO2: 97%      Subjective Assessment - 09/04/19 1106    Subjective Had COVID back in early January. Had acute hypoxemic respiratory failure due to ARDS/COVID-19 pneumonia. Received inpatient rehab in the hospital and was discharged 03/09/19. Also received HHPT for approx. a month-6 weeks. Has history of R knee pain, has gotten injections in the past. Difficulties with standing >2-3 minutes and walking greater than approx. 100'. No falls, does not feel balanced when she is walking. Walked up some steps with B handrails - was really holding on and was scared - about 2  weeks ago. Doesn't have full motion of BUE esp in flexion and abduction.    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 2-3 minutes    How long can you walk comfortably? 150' approx. back into therapy gym    Patient Stated Goals wants to be able to walk normal and not feel like she is going to topple over. standing up is difficult    Currently in Pain? Yes    Pain Score 5     Pain Location Knee    Pain Orientation Right    Pain Descriptors / Indicators Aching     Pain Type Acute pain    Aggravating Factors  "doesn't matter"    Pain Relieving Factors ice has helped              [facility] PT Assessment - 09/04/19 1126      Assessment   Medical Diagnosis decr functional mobility s/p COVID    Referring Provider (PT) Dr. Courtney Heys     Onset Date/Surgical Date 01/12/19    Hand Dominance Right    Prior Therapy CIR PT and HHPT      Precautions   Precaution Comments pt's BP taken on L forearm (pt reports this is how they do it at the MD and how she does it at home)      Balance Screen   Has the patient fallen in the past 6 months No    Has the patient had a decrease in activity level because of a fear of falling?  Yes    Is the patient reluctant to leave their home because of a fear of falling?  No      Home Ecologist residence    Living Arrangements Other (Comment)   daughter and grandson (1)   Type of Home Apartment    Home Access Level entry    Home Layout One level    Home Equipment Shower seat;Wheelchair - [employer] - standard    Additional Comments daughter does most of the cleaning, can bathe and dress, just takes a lot of effort and energy, takes longer than what she is used to       Prior Function   Level of [place] Requirements used to be a Freight forwarder for [employer] would want to go to church more and stand up more, do more things with her grandchildren      Sensation   Light Touch Impaired Detail    Additional Comments has N/T from neuropathy and with burning/tingling, sometimes legs will feel warm       Coordination   Gross Motor Movements are Fluid and Coordinated No    Coordination and Movement Description impaired heel > shin due to weakness       Posture/Postural Control   Posture/Postural Control Postural limitations    Postural Limitations Rounded Shoulders;Forward head      ROM / Strength   AROM / PROM / Strength Strength      Strength    Strength Assessment Site Hip;Knee;Ankle    Right/Left Hip Right;Left    Right Hip Flexion 3+/5    Left Hip Flexion 3-/5    Right/Left Knee Left;Right    Right Knee Flexion 4+/5    Right Knee Extension 5/5    Left Knee Flexion 4+/5    Left Knee Extension  5/5    Right/Left Ankle Right;Left    Right Ankle Dorsiflexion 4/5    Left Ankle Dorsiflexion 3+/5      Transfers   Transfers Sit to Stand;Stand to Sit    Sit to Stand 5: Supervision;With upper extremity assist;From bed   elevated bed   Five time sit to stand comments  27.87 seconds, HR after 102 bpm, O2 98%, RPE afterwards at 7-8/10     Stand to Sit 5: Supervision;With upper extremity assist      Ambulation/Gait   Ambulation/Gait Yes    Ambulation/Gait Assistance 5: Supervision    Ambulation/Gait Assistance Details after approx. 100', O2 after 98%, HR 114 bpm, rating exertion as 8-9/10    Assistive device None    Gait Pattern Step-through pattern;Decreased arm swing - right;Decreased arm swing - left;Decreased hip/knee flexion - right;Decreased hip/knee flexion - left;Right foot flat;Left foot flat;Wide base of support    Ambulation Surface Level;Indoor    Gait velocity 13.43 seconds = 2.44 ft/sec      Standardized Balance Assessment   Standardized Balance Assessment Timed Up and Go Test      Timed Up and Go Test   Normal TUG (seconds) 11.5    TUG Comments with no AD                      Objective measurements completed on examination: See above findings.               PT Education - 09/04/19 1150    Education Details clinical findings, POC, Medicaid visit limit for the year    Person(s) Educated Patient    Methods Explanation    Comprehension Verbalized understanding            PT Short Term Goals - 09/04/19 1154      PT SHORT TERM GOAL #1   Title Pt will be independent with initial HEP in order to build upon functional gains made in therapy. ALL STGS DUE AFTER 6 VISITS.    Baseline  currently dependent.    Time 6   visits   Status New      PT SHORT TERM GOAL #2   Title Pt will undergo BERG/DGI when pt is able to tolerate and LTG written as appropriate.    Baseline unable to perform due to time constraints and decr endurance.    Time 6    Period --   visits   Status New      PT SHORT TERM GOAL #3   Title Pt will ambulate at least 150' with RPE rating at 6-7/10 or less with supervision in order to improve functional mobility.    Baseline 8-9/10 RPE after 100'    Time 6   visits   Status New      PT SHORT TERM GOAL #4   Title Pt will perform 5x sit <> stand wit BUE support in 24 seconds or less in order to demo improved BLE strength.    Baseline 27.87 seconds from 21" mat table    Time 6   visits   Status New             PT Long Term Goals - 09/04/19 1606      PT LONG TERM GOAL #1   Title Pt will be independent with final HEP in order to build upon functional gains made in therapy. ALL LTGS DUE AFTER 13 VISITS.    Baseline dependent    Time  13   visits   Status New      PT LONG TERM GOAL #2   Title BERG/DGI goal to be written as appropriate.    Baseline not yet assessed    Time 13   visits   Status New      PT LONG TERM GOAL #3   Title Pt will ambulate at least 300' over indoor/outdoor surfaces with RPE rating at 5-6/10 or less with supervision in order to improve functional mobility.    Baseline 8-9/10 RPE after 100' indoors    Time 13   visits   Status New      PT LONG TERM GOAL #4   Title Pt will perform 5x sit <> stand with BUE support in 20 seconds or less in order to demo improved BLE strength.    Baseline 27.87 seconds from 21" mat table    Time 13   visits   Status New      PT LONG TERM GOAL #5   Title Pt will improve gait speed to at least 2.8 ft/sec in order to demo improved community mobility.    Baseline 2.44 ft/sec    Time 13   visits   Status New                  Plan - 09/04/19 1454    Clinical Impression  Statement Patient is a 54 year old female referred to Neuro OPPT for ICU myopathy s/p COVID - pt diagnosed in [name] 2021 and received inpatient rehab and home health PT. Pt's PMH is significant for: anemia, CHF, diabetes mellitus, HTN, neuropathy, obesity .The following deficits were present during the exam: decreased BLE strength, impaired sensation, decreased endurance and activity tolerance, gait abnormalities, impaired balance, postural abnormalities, R knee pain.  Based on 5x sit <> stand pt is at  high risk for falls. Pts gait speed indicates that pt is a limited community ambulator. Pt would benefit from skilled PT to address these impairments and functional limitations to maximize functional mobility independence.    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, CHF, diabetes mellitus, HTN, neuropathy, obesity    Examination-Activity Limitations Stand;Sit;Transfers;Stairs;Squat;Locomotion Level    Examination-Participation Restrictions Church;Community Activity    Stability/Clinical Decision Making Evolving/Moderate complexity    Clinical Decision Making Moderate    Rehab Potential Good    PT Frequency 2x / week    PT Duration 6 weeks    PT Treatment/Interventions ADLs/Self Care Home Management;Aquatic Therapy;DME Instruction;Gait training;Stair training;Functional mobility training;Therapeutic activities;Balance training;Therapeutic exercise;Neuromuscular re-education;Patient/family education;Energy conservation    PT Next Visit Plan initial seated strengthening/standing balance for HEP, perform BERG or DGI when pt can tolerate, SciFit/NuStep when appropriate.    Consulted and Agree with Plan of Care Patient           Patient will benefit from skilled therapeutic intervention in order to improve the following deficits and impairments:  Abnormal gait,  Cardiopulmonary status limiting activity, Decreased activity tolerance, Decreased balance, Decreased coordination, Decreased endurance, Decreased mobility, Difficulty walking, Decreased strength, Impaired sensation, Postural dysfunction, Obesity  Visit Diagnosis: Unsteadiness on feet  Muscle weakness (generalized)  Other abnormalities of gait and mobility  Difficulty in walking, not elsewhere classified     Problem List Patient Active Problem List   Diagnosis Date Noted   Mixed stress and urge urinary incontinence 07/04/2019   Nerve pain 05/16/2019   Myofascial pain 05/16/2019  Subluxation of acromioclavicular joint, right, sequela 03/23/2019   Sickle cell trait ([place])    Labile blood glucose    Diabetic peripheral neuropathy (HCC)    Hypokalemia    Pain in joint of left shoulder    Debility    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   COVID-19    History of anemia    Super obese    Poorly controlled type 2 diabetes mellitus with peripheral neuropathy (HCC)    Acute on chronic diastolic (congestive) heart failure (HCC)    Hypernatremia    Shock circulatory ([place])    Septic shock (HCC)    Hypotension 01/13/2019   Acute respiratory failure with hypoxemia ([place]) 01/13/2019   Acute respiratory distress syndrome (ARDS) due to COVID-19 virus ([place]) 01/12/2019   Grade I diastolic dysfunction 35/57/3220   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   Dyspnea on exertion 05/19/2016   Other fatigue 05/19/2016   Absolute anemia 04/15/2016   Left leg weakness 10/18/2014   Left leg paresthesias 10/18/2014   Acute left-sided weakness 10/18/2014   Encounter for central line placement 03/08/2014   Taking drug for chronic disease 03/08/2014   H/O insertion of insulin pump 02/18/2014   Long term current use of insulin ([place]) 02/18/2014   Insulin pump in place 02/18/2014   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   Abnormal ballistocardiogram 05/18/2013   Abnormal  nuclear stress test 05/18/2013   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 [place], [place], [address] Phone: [phone]   Fax:  [phone]  Name: DAYSY SANTINI MRN: [id number] Date of Birth: [birth year]

## 2019-09-21 ENCOUNTER — Other Ambulatory Visit: Payer: Self-pay

## 2019-09-21 ENCOUNTER — Ambulatory Visit: Payer: Medicaid Other | Attending: Physical Medicine and Rehabilitation | Admitting: Physical Therapy

## 2019-09-21 DIAGNOSIS — M6281 Muscle weakness (generalized): Secondary | ICD-10-CM | POA: Insufficient documentation

## 2019-09-21 DIAGNOSIS — R41840 Attention and concentration deficit: Secondary | ICD-10-CM | POA: Diagnosis present

## 2019-09-21 DIAGNOSIS — M79601 Pain in right arm: Secondary | ICD-10-CM | POA: Diagnosis present

## 2019-09-21 DIAGNOSIS — M79602 Pain in left arm: Secondary | ICD-10-CM | POA: Diagnosis present

## 2019-09-21 DIAGNOSIS — R2689 Other abnormalities of gait and mobility: Secondary | ICD-10-CM

## 2019-09-21 DIAGNOSIS — R2681 Unsteadiness on feet: Secondary | ICD-10-CM | POA: Insufficient documentation

## 2019-09-21 DIAGNOSIS — M25612 Stiffness of left shoulder, not elsewhere classified: Secondary | ICD-10-CM | POA: Diagnosis present

## 2019-09-21 DIAGNOSIS — R262 Difficulty in walking, not elsewhere classified: Secondary | ICD-10-CM | POA: Insufficient documentation

## 2019-09-21 NOTE — Therapy (Signed)
[facility] [address] [place], [place], [address] Phone: [phone]   Fax:  [phone]  Physical Therapy Treatment  Patient Details  Name: Kaitlin [name] MRN: [id number] Date of Birth: 12/12/1965 Referring Provider (PT): Dr. Courtney Heys    Encounter Date: 09/21/2019   PT End of Session - 09/21/19 1407    Visit Number 2    Number of Visits 13    Date for PT Re-Evaluation --   awaiting Medicaid auth   Authorization Type Medicaid - awaiting auth    PT Start Time 1232    PT Stop Time 1311    PT Time Calculation (min) 39 min    Equipment Utilized During Treatment Gait belt    Activity Tolerance Patient tolerated treatment well;Patient limited by fatigue    Behavior During Therapy [facility] for tasks assessed/performed           Past Medical History:  Diagnosis Date  . Anemia   . Bronchitis   . Cataracts, bilateral 01/12/2018  . CHF (congestive heart failure) ([place])   . Diabetes mellitus   . High cholesterol   . Hypertension    takes medicine to protect kidneys, does not have HTN  . Leukocytosis   . Morbid obesity ([place]) 09/11/2011   BMI 57  . Neuropathy 01/12/2018  . NICM (nonischemic cardiomyopathy) ([place]) 05/01/2013   Overview:  Last Assessment & Plan:  euvolemic on physical exam.   . Obesity   . Sickle cell trait ([place])   . Thymoma     Past Surgical History:  Procedure Laterality Date  . COLONOSCOPY N/A 06/21/2017   Procedure: COLONOSCOPY;  Surgeon: Ronnette Juniper, MD;  Location: [facility];  Service: Gastroenterology;  Laterality: N/A;  . LEFT HEART CATHETERIZATION WITH CORONARY ANGIOGRAM N/A 05/25/2013   Procedure: LEFT HEART CATHETERIZATION WITH CORONARY ANGIOGRAM;  Surgeon: Troy Sine, MD;  Location: [facility] CATH LAB;  Service: Cardiovascular;  Laterality: N/A;  . LYMPH NODE BIOPSY Right 11/30/2012   Procedure: RIGHT TONSIL BIOPSY WITH FRESH FROZEN ANALYSIS;  Surgeon: Jodi Marble, MD;  Location: [place];   Service: ENT;  Laterality: Right;  . MEDIASTERNOTOMY N/A 01/12/2013   Procedure: MEDIAN STERNOTOMY;  Surgeon: Gaye Pollack, MD;  Location: [place];  Service: Thoracic;  Laterality: N/A;  . MEDIASTINOTOMY CHAMBERLAIN MCNEIL Right 11/06/2012   Procedure: MEDIASTINOTOMY CHAMBERLAIN MCNEILPROCEDURE;  Surgeon: Gaye Pollack, MD;  Location: [place];  Service: Thoracic;  Laterality: Right;  . POLYPECTOMY  06/21/2017   Procedure: POLYPECTOMY;  Surgeon: Ronnette Juniper, MD;  Location: [facility];  Service: Gastroenterology;;  . RESECTION OF A THYMOMA N/A 01/12/2013   Procedure: RESECTION OF A THYMOMA;  Surgeon: Gaye Pollack, MD;  Location: [place];  Service: Thoracic;  Laterality: N/A;  . TONSILLECTOMY    . TUBAL LIGATION      There were no vitals filed for this visit.   Subjective Assessment - 09/21/19 1236    Subjective No changes since the last time she was here. Some days her SOB is good, other times it is not.    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 2-3 minutes    How long can you walk comfortably? 150' approx. back into therapy gym    Patient Stated Goals wants to be able to walk normal and not feel like she is going to topple over. standing up is difficult  Currently in Pain? Yes    Pain Score 4     Pain Location Chest   upper chest   Pain Descriptors / Indicators Tightness    Pain Type Chronic pain   ever since she left the hospital after having COVID   Aggravating Factors  lifting an object that is too heavy, reaching above head    Pain Relieving Factors tylenol                    Access Code: [facility] URL: [email or web address] Date: 09/21/2019 Prepared by: Janann August  Initiated HEP - see MedBridge for more info  Exercises Sit to Stand - 2 x daily - 7 x weekly - 2 sets - 5 reps Seated March - 1-2 x daily - 5 x weekly - 2 sets - 10  reps Seated Hip Abduction with Resistance - 1-2 x daily - 5 x weekly - 2 sets - 10 reps - began with red theraband, upgraded to green tband  Seated Knee Flexion with Anchored Resistance - 1-2 x daily - 5 x weekly - 2 sets - 7 reps - with green theraband  Heel Toe Raises with Counter Support - 1 x daily - 5 x weekly - 2 sets - 10 reps          [facility] Adult PT Treatment/Exercise - 09/21/19 1240      Transfers   Transfers Sit to Stand;Stand to Sit    Sit to Stand 5: Supervision;With upper extremity assist;From bed    Stand to Sit 5: Supervision;With upper extremity assist    Comments 2 x 5 reps - added to HEP, from slightly elevated mat table      Ambulation/Gait   Ambulation/Gait Yes    Ambulation/Gait Assistance 5: Supervision    Ambulation/Gait Assistance Details into and out of clinic - pt scuffing BLE    Assistive device None    Gait Pattern Step-through pattern;Decreased arm swing - right;Decreased arm swing - left;Decreased hip/knee flexion - right;Decreased hip/knee flexion - left;Right foot flat;Left foot flat;Wide base of support    Ambulation Surface Level;Indoor                  PT Education - 09/21/19 1406    Education Details initial HEP, where to purchase pulse oximeter for home use    Person(s) Educated Patient    Methods Explanation;Demonstration;Handout    Comprehension Verbalized understanding;Returned demonstration            PT Short Term Goals - 09/04/19 1154      PT SHORT TERM GOAL #1   Title Pt will be independent with initial HEP in order to build upon functional gains made in therapy. ALL STGS DUE AFTER 6 VISITS.    Baseline currently dependent.    Time 6   visits   Status New      PT SHORT TERM GOAL #2   Title Pt will undergo BERG/DGI when pt is able to tolerate and LTG written as appropriate.    Baseline unable to perform due to time constraints and decr endurance.    Time 6    Period --   visits   Status New      PT SHORT TERM  GOAL #3   Title Pt will ambulate at least 150' with RPE rating at 6-7/10 or less with supervision in order to improve functional mobility.    Baseline 8-9/10 RPE after 100'    Time 6   visits  Status New      PT SHORT TERM GOAL #4   Title Pt will perform 5x sit <> stand wit BUE support in 24 seconds or less in order to demo improved BLE strength.    Baseline 27.87 seconds from 21" mat table    Time 6   visits   Status New             PT Long Term Goals - 09/04/19 1606      PT LONG TERM GOAL #1   Title Pt will be independent with final HEP in order to build upon functional gains made in therapy. ALL LTGS DUE AFTER 13 VISITS.    Baseline dependent    Time 13   visits   Status New      PT LONG TERM GOAL #2   Title BERG/DGI goal to be written as appropriate.    Baseline not yet assessed    Time 13   visits   Status New      PT LONG TERM GOAL #3   Title Pt will ambulate at least 300' over indoor/outdoor surfaces with RPE rating at 5-6/10 or less with supervision in order to improve functional mobility.    Baseline 8-9/10 RPE after 100' indoors    Time 13   visits   Status New      PT LONG TERM GOAL #4   Title Pt will perform 5x sit <> stand with BUE support in 20 seconds or less in order to demo improved BLE strength.    Baseline 27.87 seconds from 21" mat table    Time 13   visits   Status New      PT LONG TERM GOAL #5   Title Pt will improve gait speed to at least 2.8 ft/sec in order to demo improved community mobility.    Baseline 2.44 ft/sec    Time 13   visits   Status New                 Plan - 09/21/19 1407    Clinical Impression Statement Focus of today's skilled session was initiating pt's HEP for BLE strengthening. Pt needing intermittent rest breaks due to fatigue. Pt's HR at rest at 98-100 bpm and incr to 115-120 after activity. O2 sats remained between 95-98% throughout session. Will continue to progress towards LTGs.    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, CHF, diabetes mellitus, HTN, neuropathy, obesity    Examination-Activity Limitations Stand;Sit;Transfers;Stairs;Squat;Locomotion Level    Examination-Participation Restrictions Church;Community Activity    Stability/Clinical Decision Making Evolving/Moderate complexity    Rehab Potential Good    PT Frequency 2x / week    PT Duration 6 weeks    PT Treatment/Interventions ADLs/Self Care Home Management;Aquatic Therapy;DME Instruction;Gait training;Stair training;Functional mobility training;Therapeutic activities;Balance training;Therapeutic exercise;Neuromuscular re-education;Patient/family education;Energy conservation    PT Next Visit Plan monitor O2/HR. seated and standing functional strengthening, perform DGI when approrpriate (pt fatigues easily), standing balance    PT Home Exercise Plan [facility]    Consulted and Agree with Plan of Care Patient           Patient will benefit from skilled therapeutic intervention in order to improve the following deficits and impairments:  Abnormal gait, Cardiopulmonary status limiting activity, Decreased activity tolerance, Decreased balance, Decreased coordination, Decreased endurance, Decreased mobility, Difficulty walking, Decreased strength, Impaired sensation, Postural dysfunction, Obesity  Visit Diagnosis: Unsteadiness on  feet  Muscle weakness (generalized)  Other abnormalities of gait and mobility  Difficulty in walking, not elsewhere classified     Problem List Patient Active Problem List   Diagnosis Date Noted  . Mixed stress and urge urinary incontinence 07/04/2019  . Nerve pain 05/16/2019  . Myofascial pain 05/16/2019  . Subluxation of acromioclavicular joint, right, sequela 03/23/2019  . Sickle cell trait ([place])   . Labile blood glucose   . Diabetic peripheral  neuropathy ([place])   . Hypokalemia   . Pain in joint of left shoulder   . Debility   . 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 COVID-19   . History of anemia   . Super obese   . Poorly controlled type 2 diabetes mellitus with peripheral neuropathy ([place])   . Acute on chronic diastolic (congestive) heart failure ([place])   . Hypernatremia   . Shock circulatory ([place])   . Septic shock ([place])   . Hypotension 01/13/2019  . Acute respiratory failure with hypoxemia ([place]) 01/13/2019  . Acute respiratory distress syndrome (ARDS) due to COVID-19 virus ([place]) 01/12/2019  . Grade I diastolic dysfunction 40/97/3532  . 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 Dyspnea on exertion 05/19/2016  . Other fatigue 05/19/2016  . Absolute anemia 04/15/2016  . Left leg weakness 10/18/2014  . Left leg paresthesias 10/18/2014  . Acute left-sided weakness 10/18/2014  . Encounter for central line placement 03/08/2014  . Taking drug for chronic disease 03/08/2014  . H/O insertion of insulin pump 02/18/2014  . Long term current use of insulin ([place]) 02/18/2014  . Insulin pump in place 02/18/2014  . 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 Abnormal ballistocardiogram 05/18/2013  . Abnormal nuclear stress test 05/18/2013  . 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 NICM (nonischemic cardiomyopathy) ([place]) 05/01/2013  . Insulin dependent type 2 diabetes mellitus ([place]) 04/10/2013  . Sex counseling 04/10/2013  . Edema leg 04/10/2013  . Upper respiratory infection, viral 04/10/2013  . Pedal edema 03/29/2013  . Pneumonia 12/12/2012  . Essential hypertension 12/12/2012  . HLD (hyperlipidemia) 12/12/2012  . Tonsillar mass 12/01/2012  . 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 Leukocytosis   . Anemia   . High cholesterol   . Hypertension   . Blurred vision 09/11/2011  . Foot pain 09/11/2011  . Breast pain 09/11/2011  . Morbid obesity ([place]) 09/11/2011  . Fungal infection of nail 09/11/2011  .  Avitaminosis D 09/11/2011    Arliss Journey, PT ,DPT 09/21/2019, 2:10 PM  [facility] [address] [place], [place], [address] Phone: [phone]   Fax:  [phone]  Name: Kaitlin [name] MRN: [id number] Date of Birth: 1965-04-16

## 2019-09-21 NOTE — Patient Instructions (Signed)
Access Code: [facility] URL: [email or web address] Date: 09/21/2019 Prepared by: Janann August  Exercises Sit to Stand - 2 x daily - 7 x weekly - 2 sets - 5 reps Seated March - 1-2 x daily - 5 x weekly - 2 sets - 10 reps Seated Hip Abduction with Resistance - 1-2 x daily - 5 x weekly - 2 sets - 10 reps Seated Knee Flexion with Anchored Resistance - 1-2 x daily - 5 x weekly - 2 sets - 7 reps Heel Toe Raises with Counter Support - 1 x daily - 5 x weekly - 2 sets - 10 reps

## 2019-09-25 ENCOUNTER — Other Ambulatory Visit: Payer: Self-pay

## 2019-09-25 ENCOUNTER — Ambulatory Visit: Payer: Medicaid Other | Admitting: Physical Therapy

## 2019-09-25 ENCOUNTER — Encounter: Payer: Self-pay | Admitting: Physical Therapy

## 2019-09-25 DIAGNOSIS — M6281 Muscle weakness (generalized): Secondary | ICD-10-CM

## 2019-09-25 DIAGNOSIS — R2689 Other abnormalities of gait and mobility: Secondary | ICD-10-CM

## 2019-09-25 DIAGNOSIS — R2681 Unsteadiness on feet: Secondary | ICD-10-CM | POA: Diagnosis not present

## 2019-09-25 DIAGNOSIS — R262 Difficulty in walking, not elsewhere classified: Secondary | ICD-10-CM

## 2019-09-26 NOTE — Therapy (Signed)
[facility] [address] [place], [place], [address] Phone: [phone]   Fax:  [phone]  Physical Therapy Treatment  Patient Details  Name: Kaitlin [name] MRN: [id number] Date of Birth: 01-[name]-1966 Referring Provider (PT): Dr. Courtney Heys    Encounter Date: 09/25/2019   PT End of Session - 09/25/19 1237    Visit Number 3    Number of Visits 13    Date for PT Re-Evaluation --   awaiting Medicaid auth   Authorization Type Medicaid - awaiting auth    PT Start Time 1233    PT Stop Time 1315    PT Time Calculation (min) 42 min    Equipment Utilized During Treatment Gait belt    Activity Tolerance Patient tolerated treatment well;Patient limited by fatigue    Behavior During Therapy [facility] for tasks assessed/performed           Past Medical History:  Diagnosis Date  . Anemia   . Bronchitis   . Cataracts, bilateral 01/12/2018  . CHF (congestive heart failure) ([place])   . Diabetes mellitus   . High cholesterol   . Hypertension    takes medicine to protect kidneys, does not have HTN  . Leukocytosis   . Morbid obesity ([place]) 09/11/2011   BMI 57  . Neuropathy 01/12/2018  . NICM (nonischemic cardiomyopathy) ([place]) 05/01/2013   Overview:  Last Assessment & Plan:  euvolemic on physical exam.   . Obesity   . Sickle cell trait ([place])   . Thymoma     Past Surgical History:  Procedure Laterality Date  . COLONOSCOPY N/A 06/21/2017   Procedure: COLONOSCOPY;  Surgeon: Ronnette Juniper, MD;  [facility];  Service: Gastroenterology;  Laterality: N/A;  . LEFT HEART CATHETERIZATION WITH CORONARY ANGIOGRAM N/A 05/25/2013   Procedure: LEFT HEART CATHETERIZATION WITH CORONARY ANGIOGRAM;  Surgeon: Troy Sine, MD;  Location: [facility] CATH LAB;  Service: Cardiovascular;  Laterality: N/A;  . LYMPH NODE BIOPSY Right 11/30/2012   Procedure: RIGHT TONSIL BIOPSY WITH FRESH FROZEN ANALYSIS;  Surgeon: Jodi [place], MD;  Location: [facility];   Service: ENT;  Laterality: Right;  . MEDIASTERNOTOMY N/A 01/12/2013   Procedure: MEDIAN STERNOTOMY;  Surgeon: Gaye Pollack, MD;  Location: [facility];  Service: Thoracic;  Laterality: N/A;  . MEDIASTINOTOMY CHAMBERLAIN MCNEIL Right 11/06/2012   Procedure: MEDIASTINOTOMY CHAMBERLAIN MCNEILPROCEDURE;  Surgeon: Gaye Pollack, MD;  Location: [facility];  Service: Thoracic;  Laterality: Right;  . POLYPECTOMY  06/21/2017   Procedure: POLYPECTOMY;  Surgeon: Ronnette Juniper, MD;  Location: [facility];  Service: Gastroenterology;;  . RESECTION OF A THYMOMA N/A 01/12/2013   Procedure: RESECTION OF A THYMOMA;  Surgeon: Gaye Pollack, MD;  Location: [facility];  Service: Thoracic;  Laterality: N/A;  . TONSILLECTOMY    . TUBAL LIGATION      There were no vitals filed for this visit.   Subjective Assessment - 09/25/19 1235    Subjective No new complaints. No falls. Some right knee pain    Pertinent History PMH: ICU myopathy s/p COVID (01/2019) anemia, CHF, diabetes mellitus, HTN, neuropathy, obesity    How long can you stand comfortably? 2-3 minutes    How long can you walk comfortably? 150' approx. back into therapy gym    Patient Stated Goals wants to be able to walk normal and not feel like she is going to topple over. standing up is difficult    Currently in Pain? Yes    Pain Score 7  Pain Location Knee    Pain Orientation Right    Pain Descriptors / Indicators Aching;Sore    Pain Type Chronic pain    Pain Onset More than a month ago    Pain Frequency Intermittent    Aggravating Factors  unknown    Pain Relieving Factors resting, lying down, tylenol                 [facility] Adult PT Treatment/Exercise - 09/25/19 1238      Transfers   Transfers Sit to Stand;Stand to Sit    Sit to Stand 5: Supervision;With upper extremity assist;From bed    Stand to Sit 5: Supervision;With upper extremity assist      Ambulation/Gait   Ambulation/Gait Yes    Ambulation/Gait Assistance 5: Supervision     Ambulation/Gait Assistance Details cues for posture, deep breathing and increased foot clearance. Increased work of breathing with each gait rep with SaO2 >/= 98%m, HR max 120 bpm.  Pt needing seated rest break of 2-3 minutes to recover shortness of breath.     Ambulation Distance (Feet) 230 Feet   x 2, plus around gym with session   Assistive device None    Gait Pattern Step-through pattern;Decreased arm swing - right;Decreased arm swing - left;Decreased hip/knee flexion - right;Decreased hip/knee flexion - left;Right foot flat;Left foot flat;Wide base of support    Ambulation Surface Level;Indoor      Self-Care   Self-Care Other Self-Care Comments    Other Self-Care Comments  discussed adding a walking program. Pt to walk to mailbox and back every other day      Exercises   Exercises Other Exercises    Other Exercises  seated at edge of mat table: with 2# ankle weights- heel/toe raises, LAQ's and alternating marching for 10 reps each bil LE's; red band resistance- hamstring curls for 10 reps each leg. cues for slow and controlled movements with all ex's; standing with UE support- heel raises, alternating hip abduction,  for 10 reps each.                  PT Education - 09/25/19 1630    Education Details verbally added a walking program to pt's HEP    Person(s) Educated Patient    Methods Explanation;Demonstration;Verbal cues    Comprehension Verbalized understanding;Returned demonstration            PT Short Term Goals - 09/04/19 1154      PT SHORT TERM GOAL #1   Title Pt will be independent with initial HEP in order to build upon functional gains made in therapy. ALL STGS DUE AFTER 6 VISITS.    Baseline currently dependent.    Time 6   visits   Status New      PT SHORT TERM GOAL #2   Title Pt will undergo BERG/DGI when pt is able to tolerate and LTG written as appropriate.    Baseline unable to perform due to time constraints and decr endurance.    Time 6    Period --    visits   Status New      PT SHORT TERM GOAL #3   Title Pt will ambulate at least 150' with RPE rating at 6-7/10 or less with supervision in order to improve functional mobility.    Baseline 8-9/10 RPE after 100'    Time 6   visits   Status New      PT SHORT TERM GOAL #4   Title Pt will  perform 5x sit <> stand wit BUE support in 24 seconds or less in order to demo improved BLE strength.    Baseline 27.87 seconds from 21" mat table    Time 6   visits   Status New             PT Long Term Goals - 09/04/19 1606      PT LONG TERM GOAL #1   Title Pt will be independent with final HEP in order to build upon functional gains made in therapy. ALL LTGS DUE AFTER 13 VISITS.    Baseline dependent    Time 13   visits   Status New      PT LONG TERM GOAL #2   Title BERG/DGI goal to be written as appropriate.    Baseline not yet assessed    Time 13   visits   Status New      PT LONG TERM GOAL #3   Title Pt will ambulate at least 300' over indoor/outdoor surfaces with RPE rating at 5-6/10 or less with supervision in order to improve functional mobility.    Baseline 8-9/10 RPE after 100' indoors    Time 13   visits   Status New      PT LONG TERM GOAL #4   Title Pt will perform 5x sit <> stand with BUE support in 20 seconds or less in order to demo improved BLE strength.    Baseline 27.87 seconds from 21" mat table    Time 13   visits   Status New      PT LONG TERM GOAL #5   Title Pt will improve gait speed to at least 2.8 ft/sec in order to demo improved community mobility.    Baseline 2.44 ft/sec    Time 13   visits   Status New                 Plan - 09/25/19 1237    Clinical Impression Statement Today's skilled session focused on activity tolerance and strengthening. Verbally added a walking program to pt's HEP. No issues other than fatigue reported with session with rest breaks taken as needed. The pt is progressing toward goals and should benefit from continued PT  to progress toward unmet goals.    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, CHF, diabetes mellitus, HTN, neuropathy, obesity    Examination-Activity Limitations Stand;Sit;Transfers;Stairs;Squat;Locomotion Level    Examination-Participation Restrictions Church;Community Activity    Stability/Clinical Decision Making Evolving/Moderate complexity    Rehab Potential Good    PT Frequency 2x / week    PT Duration 6 weeks    PT Treatment/Interventions ADLs/Self Care Home Management;Aquatic Therapy;DME Instruction;Gait training;Stair training;Functional mobility training;Therapeutic activities;Balance training;Therapeutic exercise;Neuromuscular re-education;Patient/family education;Energy conservation    PT Next Visit Plan monitor O2/HR. seated and standing functional strengthening, perform DGI when approrpriate (pt fatigues easily), standing balance    PT Home Exercise Plan [facility]    Consulted and Agree with Plan of Care Patient           Patient will benefit from skilled therapeutic intervention in order to improve the following deficits and impairments:  Abnormal gait, Cardiopulmonary status limiting activity, Decreased activity tolerance, Decreased balance, Decreased coordination, Decreased endurance, Decreased mobility, Difficulty walking, Decreased strength, Impaired sensation, Postural dysfunction, Obesity  Visit Diagnosis: Unsteadiness on feet  Muscle weakness (generalized)  Other abnormalities of gait and mobility  Difficulty in  walking, not elsewhere classified     Problem List Patient Active Problem List   Diagnosis Date Noted  . Mixed stress and urge urinary incontinence 07/04/2019  . Nerve pain 05/16/2019  . Myofascial pain 05/16/2019  . Subluxation of acromioclavicular joint, right, sequela 03/23/2019  . Sickle cell trait  ([place])   . Labile blood glucose   . Diabetic peripheral neuropathy ([place])   . Hypokalemia   . Pain in joint of left shoulder   . Debility   . 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 COVID-19   . History of anemia   . Super obese   . Poorly controlled type 2 diabetes mellitus with peripheral neuropathy ([place])   . Acute on chronic diastolic (congestive) heart failure ([place])   . Hypernatremia   . Shock circulatory ([place])   . Septic shock ([place])   . Hypotension 01/13/2019  . Acute respiratory failure with hypoxemia ([place]) 01/13/2019  . Acute respiratory distress syndrome (ARDS) due to COVID-19 virus ([place]) 01/12/2019  . Grade I diastolic dysfunction 31/49/7026  . 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 Dyspnea on exertion 05/19/2016  . Other fatigue 05/19/2016  . Absolute anemia 04/15/2016  . Left leg weakness 10/18/2014  . Left leg paresthesias 10/18/2014  . Acute left-sided weakness 10/18/2014  . Encounter for central line placement 03/08/2014  . Taking drug for chronic disease 03/08/2014  . H/O insertion of insulin pump 02/18/2014  . Long term current use of insulin ([place]) 02/18/2014  . Insulin pump in place 02/18/2014  . 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 Abnormal ballistocardiogram 05/18/2013  . Abnormal nuclear stress test 05/18/2013  . 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 NICM (nonischemic cardiomyopathy) ([place]) 05/01/2013  . Insulin dependent type 2 diabetes mellitus ([place]) 04/10/2013  . Sex counseling 04/10/2013  . Edema leg 04/10/2013  . Upper respiratory infection, viral 04/10/2013  . Pedal edema 03/29/2013  . Pneumonia 12/12/2012  . Essential hypertension 12/12/2012  . HLD (hyperlipidemia) 12/12/2012  . Tonsillar mass 12/01/2012  . 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 Leukocytosis   . Anemia   . High cholesterol   . Hypertension   . Blurred vision 09/11/2011  . Foot pain 09/11/2011  . Breast pain 09/11/2011  . Morbid obesity ([place])  09/11/2011  . Fungal infection of nail 09/11/2011  . Avitaminosis D 09/11/2011    Willow Ora, PTA, [facility] [address], [place], [place] [address] [phone] 09/26/19, 11:21 AM   Name: CAMIA DIPINTO MRN: [id number] Date of Birth: 20-Nov-1965

## 2019-09-28 ENCOUNTER — Ambulatory Visit: Payer: Medicaid Other | Admitting: Physical Therapy

## 2019-09-28 ENCOUNTER — Other Ambulatory Visit: Payer: Self-pay

## 2019-09-28 DIAGNOSIS — M6281 Muscle weakness (generalized): Secondary | ICD-10-CM

## 2019-09-28 DIAGNOSIS — R262 Difficulty in walking, not elsewhere classified: Secondary | ICD-10-CM

## 2019-09-28 DIAGNOSIS — R2689 Other abnormalities of gait and mobility: Secondary | ICD-10-CM

## 2019-09-28 DIAGNOSIS — R2681 Unsteadiness on feet: Secondary | ICD-10-CM

## 2019-09-28 NOTE — Therapy (Signed)
[facility] [address] [place], [place], [address] Phone: [phone]   Fax:  [phone]  Physical Therapy Treatment  Patient Details  Name: Kaitlin [name] MRN: [id number] Date of Birth: 02/10/1965 Referring Provider (PT): Dr. Courtney Heys    Encounter Date: 09/28/2019   PT End of Session - 09/28/19 1317    Visit Number 4    Number of Visits 13    Date for PT Re-Evaluation --   awaiting Medicaid auth   Authorization Type Medicaid - awaiting auth    PT Start Time 1232    PT Stop Time 1313    PT Time Calculation (min) 41 min    Equipment Utilized During Treatment Gait belt    Activity Tolerance Patient tolerated treatment well;Patient limited by fatigue    Behavior During Therapy [facility] for tasks assessed/performed           Past Medical History:  Diagnosis Date  . Anemia   . Bronchitis   . Cataracts, bilateral 01/12/2018  . CHF (congestive heart failure) ([place])   . Diabetes mellitus   . High cholesterol   . Hypertension    takes medicine to protect kidneys, does not have HTN  . Leukocytosis   . Morbid obesity ([place]) 09/11/2011   BMI 57  . Neuropathy 01/12/2018  . NICM (nonischemic cardiomyopathy) ([place]) 05/01/2013   Overview:  Last Assessment & Plan:  euvolemic on physical exam.   . Obesity   . Sickle cell trait ([place])   . Thymoma     Past Surgical History:  Procedure Laterality Date  . COLONOSCOPY N/A 06/21/2017   Procedure: COLONOSCOPY;  Surgeon: Ronnette Juniper, MD;  [facility];  Service: Gastroenterology;  Laterality: N/A;  . LEFT HEART CATHETERIZATION WITH CORONARY ANGIOGRAM N/A 05/25/2013   Procedure: LEFT HEART CATHETERIZATION WITH CORONARY ANGIOGRAM;  Surgeon: Troy Sine, MD;  Location: [facility] CATH LAB;  Service: Cardiovascular;  Laterality: N/A;  . LYMPH NODE BIOPSY Right 11/30/2012   Procedure: RIGHT TONSIL BIOPSY WITH FRESH FROZEN ANALYSIS;  Surgeon: Jodi [facility], MD;  Location: [facility];   Service: ENT;  Laterality: Right;  . MEDIASTERNOTOMY N/A 01/12/2013   Procedure: MEDIAN STERNOTOMY;  Surgeon: Gaye Pollack, MD;  Location: [facility];  Service: Thoracic;  Laterality: N/A;  . MEDIASTINOTOMY CHAMBERLAIN MCNEIL Right 11/06/2012   Procedure: MEDIASTINOTOMY CHAMBERLAIN MCNEILPROCEDURE;  Surgeon: Gaye Pollack, MD;  Location: [facility];  Service: Thoracic;  Laterality: Right;  . POLYPECTOMY  06/21/2017   Procedure: POLYPECTOMY;  Surgeon: Ronnette Juniper, MD;  Location: [facility];  Service: Gastroenterology;;  . RESECTION OF A THYMOMA N/A 01/12/2013   Procedure: RESECTION OF A THYMOMA;  Surgeon: Gaye Pollack, MD;  Location: [facility];  Service: Thoracic;  Laterality: N/A;  . TONSILLECTOMY    . TUBAL LIGATION      There were no vitals filed for this visit.   Subjective Assessment - 09/28/19 1236    Subjective Has been doing more walking at home. No other changes. Feeling a little bit more SOB today - was rushing to get here.    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 2-3 minutes    How long can you walk comfortably? 150' approx. back into therapy gym    Patient Stated Goals wants to be able to walk normal and not feel like she is going to topple over. standing up is  difficult    Currently in Pain? Yes    Pain Score 6     Pain Location Knee    Pain Orientation Right;Medial    Pain Descriptors / Indicators Aching;Tightness    Pain Type Chronic pain    Pain Onset More than a month ago    Aggravating Factors  doing too much    Pain Relieving Factors staying off of it                             [facility] Adult PT Treatment/Exercise - 09/28/19 1248      Transfers   Transfers Sit to Stand;Stand to Sit    Sit to Stand Without upper extremity assist;5: Supervision    Stand to Sit 5: Supervision;Without upper extremity assist    Comments x5 reps and additional x7  reps from elevated mat table, HR afterwards at 120-124 bpm and O2 at 95-96%      Ambulation/Gait   Ambulation/Gait Yes    Ambulation/Gait Assistance 5: Supervision    Ambulation/Gait Assistance Details HR afterwards 136 bpm and O2 at 98%    Ambulation Distance (Feet) 230 Feet    Assistive device None    Gait Pattern Step-through pattern;Decreased arm swing - right;Decreased arm swing - left;Decreased hip/knee flexion - right;Decreased hip/knee flexion - left;Right foot flat;Left foot flat;Wide base of support;Lateral hip instability    Gait Comments cues throughout gait for pursed lip breathing and after activities when pt feeling SOB       Exercises   Exercises Knee/Hip    Other Exercises  seated at edge of mat table: with 2# ankle weights- heel/toe raises x10 reps, LAQ'sx10 reps each leg and alternating marching x10 reps, seated knee flexion x10 reps B. standing with UE support with 2# ankle weights- heel raises x10 reps.       Knee/Hip Exercises: Standing   Other Standing Knee Exercises 2 x 5 reps mini squats with BUE support on chair, after initial set HR incr up to 144 bpm, able to decr with rest to 118 bpm, after 2nd set HR 134 bpm                    PT Short Term Goals - 09/04/19 1154      PT SHORT TERM GOAL #1   Title Pt will be independent with initial HEP in order to build upon functional gains made in therapy. ALL STGS DUE AFTER 6 VISITS.    Baseline currently dependent.    Time 6   visits   Status New      PT SHORT TERM GOAL #2   Title Pt will undergo BERG/DGI when pt is able to tolerate and LTG written as appropriate.    Baseline unable to perform due to time constraints and decr endurance.    Time 6    Period --   visits   Status New      PT SHORT TERM GOAL #3   Title Pt will ambulate at least 150' with RPE rating at 6-7/10 or less with supervision in order to improve functional mobility.    Baseline 8-9/10 RPE after 100'    Time 6   visits   Status New       PT SHORT TERM GOAL #4   Title Pt will perform 5x sit <> stand wit BUE support in 24 seconds or less in order to demo improved BLE strength.  Baseline 27.87 seconds from 21" mat table    Time 6   visits   Status New             PT Long Term Goals - 09/04/19 1606      PT LONG TERM GOAL #1   Title Pt will be independent with final HEP in order to build upon functional gains made in therapy. ALL LTGS DUE AFTER 13 VISITS.    Baseline dependent    Time 13   visits   Status New      PT LONG TERM GOAL #2   Title BERG/DGI goal to be written as appropriate.    Baseline not yet assessed    Time 13   visits   Status New      PT LONG TERM GOAL #3   Title Pt will ambulate at least 300' over indoor/outdoor surfaces with RPE rating at 5-6/10 or less with supervision in order to improve functional mobility.    Baseline 8-9/10 RPE after 100' indoors    Time 13   visits   Status New      PT LONG TERM GOAL #4   Title Pt will perform 5x sit <> stand with BUE support in 20 seconds or less in order to demo improved BLE strength.    Baseline 27.87 seconds from 21" mat table    Time 13   visits   Status New      PT LONG TERM GOAL #5   Title Pt will improve gait speed to at least 2.8 ft/sec in order to demo improved community mobility.    Baseline 2.44 ft/sec    Time 13   visits   Status New                 Plan - 09/28/19 1620    Clinical Impression Statement Today's skilled session focused on activity tolerance and BLE strengthening. Pt reporting feeling nauseous after 230' of gait - pt reports that she feels this way sometimes after activity, went away with prolonged seated rest break. HR incr up to 120-135 bpm today with activity, with one bout of it increasing to ~144 bpm after mini squats. Will continue to progress towards LTGs.    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, CHF, diabetes mellitus, HTN, neuropathy, obesity    Examination-Activity Limitations Stand;Sit;Transfers;Stairs;Squat;Locomotion Level    Examination-Participation Restrictions Church;Community Activity    Stability/Clinical Decision Making Evolving/Moderate complexity    Rehab Potential Good    PT Frequency 2x / week    PT Duration 6 weeks    PT Treatment/Interventions ADLs/Self Care Home Management;Aquatic Therapy;DME Instruction;Gait training;Stair training;Functional mobility training;Therapeutic activities;Balance training;Therapeutic exercise;Neuromuscular re-education;Patient/family education;Energy conservation    PT Next Visit Plan monitor O2/HR. seated and standing functional strengthening, perform DGI when approrpriate (pt fatigues easily), standing balance    PT Home Exercise Plan [facility]    Consulted and Agree with Plan of Care Patient           Patient will benefit from skilled therapeutic intervention in order to improve the following deficits and impairments:  Abnormal gait, Cardiopulmonary status limiting activity, Decreased activity tolerance, Decreased balance, Decreased coordination, Decreased endurance, Decreased mobility, Difficulty walking, Decreased strength, Impaired sensation, Postural dysfunction, Obesity  Visit Diagnosis: Unsteadiness on feet  Muscle weakness (generalized)  Other abnormalities of gait and mobility  Difficulty in walking, not elsewhere classified  Problem List Patient Active Problem List   Diagnosis Date Noted  . Mixed stress and urge urinary incontinence 07/04/2019  . Nerve pain 05/16/2019  . Myofascial pain 05/16/2019  . Subluxation of acromioclavicular joint, right, sequela 03/23/2019  . Sickle cell trait ([place])   . Labile blood glucose   . Diabetic peripheral neuropathy ([place])   . Hypokalemia   . Pain in joint of left shoulder   . Debility   . Intensive care (ICU)  myopathy 02/09/2019  . COVID-19   . History of anemia   . Super obese   . Poorly controlled type 2 diabetes mellitus with peripheral neuropathy ([place])   . Acute on chronic diastolic (congestive) heart failure ([place])   . Hypernatremia   . Shock circulatory ([place])   . Septic shock ([place])   . Hypotension 01/13/2019  . Acute respiratory failure with hypoxemia ([place]) 01/13/2019  . Acute respiratory distress syndrome (ARDS) due to COVID-19 virus ([place]) 01/12/2019  . Grade I diastolic dysfunction 15/72/6203  . 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 Dyspnea on exertion 05/19/2016  . Other fatigue 05/19/2016  . Absolute anemia 04/15/2016  . Left leg weakness 10/18/2014  . Left leg paresthesias 10/18/2014  . Acute left-sided weakness 10/18/2014  . Encounter for central line placement 03/08/2014  . Taking drug for chronic disease 03/08/2014  . H/O insertion of insulin pump 02/18/2014  . Long term current use of insulin ([place]) 02/18/2014  . Insulin pump in place 02/18/2014  . 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 Abnormal ballistocardiogram 05/18/2013  . Abnormal nuclear stress test 05/18/2013  . 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 NICM (nonischemic cardiomyopathy) ([place]) 05/01/2013  . Insulin dependent type 2 diabetes mellitus ([place]) 04/10/2013  . Sex counseling 04/10/2013  . Edema leg 04/10/2013  . Upper respiratory infection, viral 04/10/2013  . Pedal edema 03/29/2013  . Pneumonia 12/12/2012  . Essential hypertension 12/12/2012  . HLD (hyperlipidemia) 12/12/2012  . Tonsillar mass 12/01/2012  . 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 Leukocytosis   . Anemia   . High cholesterol   . Hypertension   . Blurred vision 09/11/2011  . Foot pain 09/11/2011  . Breast pain 09/11/2011  . Morbid obesity ([place]) 09/11/2011  . Fungal infection of nail 09/11/2011  . Avitaminosis D 09/11/2011    Arliss Journey, PT, DPT  09/28/2019, 4:21 PM  [facility] [address] [place], [place], [address] Phone: [phone]   Fax:  [phone]  Name: Kaitlin [name] MRN: [id number] Date of Birth: 1965-01-23

## 2019-10-02 ENCOUNTER — Ambulatory Visit: Payer: Medicaid Other | Admitting: Physical Therapy

## 2019-10-02 ENCOUNTER — Other Ambulatory Visit: Payer: Self-pay

## 2019-10-02 ENCOUNTER — Encounter: Payer: Self-pay | Admitting: Physical Therapy

## 2019-10-02 DIAGNOSIS — R2689 Other abnormalities of gait and mobility: Secondary | ICD-10-CM

## 2019-10-02 DIAGNOSIS — M6281 Muscle weakness (generalized): Secondary | ICD-10-CM

## 2019-10-02 DIAGNOSIS — R2681 Unsteadiness on feet: Secondary | ICD-10-CM

## 2019-10-02 DIAGNOSIS — R262 Difficulty in walking, not elsewhere classified: Secondary | ICD-10-CM

## 2019-10-02 NOTE — Therapy (Signed)
[facility] [address] [place], [place], [address] Phone: [phone]   Fax:  [phone]  Physical Therapy Treatment  Patient Details  Name: Kaitlin [name] MRN: [id number] Date of Birth: [birth year] Referring Provider (PT): Dr. Courtney Heys    Encounter Date: 10/02/2019   PT End of Session - 10/02/19 1318    Visit Number 5    Number of Visits 13    Date for PT Re-Evaluation --   awaiting Medicaid auth   Authorization Type Medicaid - 12 PT visits approved from 09/21/19 to 10/19/19    Authorization - Visit Number 4    Authorization - Number of Visits 12    PT Start Time 0109    PT Stop Time 1313   3 minutes non billable due to pt taking granddaughter to bathroom   PT Time Calculation (min) 42 min    Activity Tolerance Patient tolerated treatment well;Patient limited by fatigue    Behavior During Therapy [facility] for tasks assessed/performed           Past Medical History:  Diagnosis Date  . Anemia   . Bronchitis   . Cataracts, bilateral 01/12/2018  . CHF (congestive heart failure) ([place])   . Diabetes mellitus   . High cholesterol   . Hypertension    takes medicine to protect kidneys, does not have HTN  . Leukocytosis   . Morbid obesity ([place]) 09/11/2011   BMI 57  . Neuropathy 01/12/2018  . NICM (nonischemic cardiomyopathy) ([place]) 05/01/2013   Overview:  Last Assessment & Plan:  euvolemic on physical exam.   . Obesity   . Sickle cell trait ([place])   . Thymoma     Past Surgical History:  Procedure Laterality Date  . COLONOSCOPY N/A 06/21/2017   Procedure: COLONOSCOPY;  Surgeon: Ronnette Juniper, MD;  Location: [facility];  Service: Gastroenterology;  Laterality: N/A;  . LEFT HEART CATHETERIZATION WITH CORONARY ANGIOGRAM N/A 05/25/2013   Procedure: LEFT HEART CATHETERIZATION WITH CORONARY ANGIOGRAM;  Surgeon: Troy Sine, MD;  Location: [facility] CATH LAB;  Service: Cardiovascular;  Laterality: N/A;  . LYMPH NODE BIOPSY  Right 11/30/2012   Procedure: RIGHT TONSIL BIOPSY WITH FRESH FROZEN ANALYSIS;  Surgeon: Jodi Marble, MD;  Location: [place];  Service: ENT;  Laterality: Right;  . MEDIASTERNOTOMY N/A 01/12/2013   Procedure: MEDIAN STERNOTOMY;  Surgeon: Gaye Pollack, MD;  Location: [facility];  Service: Thoracic;  Laterality: N/A;  . MEDIASTINOTOMY CHAMBERLAIN MCNEIL Right 11/06/2012   Procedure: MEDIASTINOTOMY CHAMBERLAIN MCNEILPROCEDURE;  Surgeon: Gaye Pollack, MD;  Location: [place];  Service: Thoracic;  Laterality: Right;  . POLYPECTOMY  06/21/2017   Procedure: POLYPECTOMY;  Surgeon: Ronnette Juniper, MD;  Location: [facility];  Service: Gastroenterology;;  . RESECTION OF A THYMOMA N/A 01/12/2013   Procedure: RESECTION OF A THYMOMA;  Surgeon: Gaye Pollack, MD;  Location: [place];  Service: Thoracic;  Laterality: N/A;  . TONSILLECTOMY    . TUBAL LIGATION      There were no vitals filed for this visit.   Subjective Assessment - 10/02/19 1232    Subjective No changes since she was last here.    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 2-3 minutes    How long can you walk comfortably? 150' approx. back into therapy gym    Patient Stated Goals wants to be able to walk normal and not feel  like she is going to topple over. standing up is difficult    Currently in Pain? Yes   reports arms feeling heavy and aching   Pain Score 4     Pain Location Knee    Pain Orientation Right;Medial    Pain Descriptors / Indicators Aching    Pain Type Chronic pain    Pain Onset More than a month ago    Aggravating Factors  might be because of the weather                             [facility] Adult PT Treatment/Exercise - 10/02/19 1239      Ambulation/Gait   Ambulation/Gait Yes    Ambulation/Gait Assistance 5: Supervision    Ambulation/Gait Assistance Details HR pre: 102 bpm and O2 at 98%, after 2 laps  post HR: 124 bpm, O2 at 96%, after 2nd bout: HR at 124 bpm and O2 at 95%    Ambulation Distance (Feet) 230 Feet    Assistive device None    Gait Pattern Step-through pattern;Decreased arm swing - right;Decreased arm swing - left;Decreased hip/knee flexion - right;Decreased hip/knee flexion - left;Right foot flat;Left foot flat;Wide base of support;Lateral hip instability    Ambulation Surface Level;Indoor               Balance Exercises - 10/02/19 0001      Balance Exercises: Standing   SLS with Vectors Upper extremity assist 2;Limitations    SLS with Vectors Limitations standing at bottom of staircase: alternating step taps to first step 2 x 10 reps    Tandem Gait Forward;2 reps;Upper extremity support;Limitations    Tandem Gait Limitations down and back 2 reps at countertop, pt reporting incr R lateral hip discomfort after performing     Retro Gait 1 rep;Limitations    Retro Gait Limitations down and back at countertop, no UE support    Marching Solid surface;Upper extremity assist 1;Forwards;Limitations    Marching Limitations down and back at counter top x2 reps, cues for slowed and controlled    Sit to Stand Elevated surface;Foam/compliant surface;Without upper extremity support;Limitations    Sit to Stand Limitations x5 reps standing on blue foam pad    Other Standing Exercises with single UE support: staggered stance weight shift at counter x10 reps B               PT Short Term Goals - 09/04/19 1154      PT SHORT TERM GOAL #1   Title Pt will be independent with initial HEP in order to build upon functional gains made in therapy. ALL STGS DUE AFTER 6 VISITS.    Baseline currently dependent.    Time 6   visits   Status New      PT SHORT TERM GOAL #2   Title Pt will undergo BERG/DGI when pt is able to tolerate and LTG written as appropriate.    Baseline unable to perform due to time constraints and decr endurance.    Time 6    Period --   visits   Status New        PT SHORT TERM GOAL #3   Title Pt will ambulate at least 150' with RPE rating at 6-7/10 or less with supervision in order to improve functional mobility.    Baseline 8-9/10 RPE after 100'    Time 6   visits   Status New      PT  SHORT TERM GOAL #4   Title Pt will perform 5x sit <> stand wit BUE support in 24 seconds or less in order to demo improved BLE strength.    Baseline 27.87 seconds from 21" mat table    Time 6   visits   Status New             PT Long Term Goals - 09/04/19 1606      PT LONG TERM GOAL #1   Title Pt will be independent with final HEP in order to build upon functional gains made in therapy. ALL LTGS DUE AFTER 13 VISITS.    Baseline dependent    Time 13   visits   Status New      PT LONG TERM GOAL #2   Title BERG/DGI goal to be written as appropriate.    Baseline not yet assessed    Time 13   visits   Status New      PT LONG TERM GOAL #3   Title Pt will ambulate at least 300' over indoor/outdoor surfaces with RPE rating at 5-6/10 or less with supervision in order to improve functional mobility.    Baseline 8-9/10 RPE after 100' indoors    Time 13   visits   Status New      PT LONG TERM GOAL #4   Title Pt will perform 5x sit <> stand with BUE support in 20 seconds or less in order to demo improved BLE strength.    Baseline 27.87 seconds from 21" mat table    Time 13   visits   Status New      PT LONG TERM GOAL #5   Title Pt will improve gait speed to at least 2.8 ft/sec in order to demo improved community mobility.    Baseline 2.44 ft/sec    Time 13   visits   Status New                 Plan - 10/02/19 1320    Clinical Impression Statement Today's skilled session focused on standing tolerance, BLE strengthening, and balance reactions. Pt with incr HR after activity to ~115-125 bpm today with O2 sats at 95-96%. Pt needing frequent seated rest breaks and water today due to fatigue. Will continue to progress towards LTGs.    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, CHF, diabetes mellitus, HTN, neuropathy, obesity    Examination-Activity Limitations Stand;Sit;Transfers;Stairs;Squat;Locomotion Level    Examination-Participation Restrictions Church;Community Activity    Stability/Clinical Decision Making Evolving/Moderate complexity    Rehab Potential Good    PT Frequency 2x / week    PT Duration 6 weeks    PT Treatment/Interventions ADLs/Self Care Home Management;Aquatic Therapy;DME Instruction;Gait training;Stair training;Functional mobility training;Therapeutic activities;Balance training;Therapeutic exercise;Neuromuscular re-education;Patient/family education;Energy conservation    PT Next Visit Plan monitor O2/HR. seated and standing functional strengthening, perform DGI when approrpriate (pt fatigues easily), standing balance    PT Home Exercise Plan [facility]    Consulted and Agree with Plan of Care Patient           Patient will benefit from skilled therapeutic intervention in order to improve the following deficits and impairments:  Abnormal gait, Cardiopulmonary status limiting activity, Decreased activity tolerance, Decreased balance, Decreased coordination, Decreased endurance, Decreased mobility, Difficulty walking, Decreased strength, Impaired sensation, Postural dysfunction, Obesity  Visit Diagnosis: Unsteadiness on feet  Muscle weakness (generalized)  Other abnormalities of  gait and mobility  Difficulty in walking, not elsewhere classified     Problem List Patient Active Problem List   Diagnosis Date Noted  . Mixed stress and urge urinary incontinence 07/04/2019  . Nerve pain 05/16/2019  . Myofascial pain 05/16/2019  . Subluxation of acromioclavicular joint, right, sequela 03/23/2019  . Sickle cell trait ([place])   . Labile blood glucose   . Diabetic  peripheral neuropathy ([place])   . Hypokalemia   . Pain in joint of left shoulder   . Debility   . 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 COVID-19   . History of anemia   . Super obese   . Poorly controlled type 2 diabetes mellitus with peripheral neuropathy ([place])   . Acute on chronic diastolic (congestive) heart failure ([place])   . Hypernatremia   . Shock circulatory ([place])   . Septic shock ([place])   . Hypotension 01/13/2019  . Acute respiratory failure with hypoxemia ([place]) 01/13/2019  . Acute respiratory distress syndrome (ARDS) due to COVID-19 virus ([place]) 01/12/2019  . Grade I diastolic dysfunction 48/18/5631  . 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 Dyspnea on exertion 05/19/2016  . Other fatigue 05/19/2016  . Absolute anemia 04/15/2016  . Left leg weakness 10/18/2014  . Left leg paresthesias 10/18/2014  . Acute left-sided weakness 10/18/2014  . Encounter for central line placement 03/08/2014  . Taking drug for chronic disease 03/08/2014  . H/O insertion of insulin pump 02/18/2014  . Long term current use of insulin ([place]) 02/18/2014  . Insulin pump in place 02/18/2014  . 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 Abnormal ballistocardiogram 05/18/2013  . Abnormal nuclear stress test 05/18/2013  . 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 NICM (nonischemic cardiomyopathy) ([place]) 05/01/2013  . Insulin dependent type 2 diabetes mellitus ([place]) 04/10/2013  . Sex counseling 04/10/2013  . Edema leg 04/10/2013  . Upper respiratory infection, viral 04/10/2013  . Pedal edema 03/29/2013  . Pneumonia 12/12/2012  . Essential hypertension 12/12/2012  . HLD (hyperlipidemia) 12/12/2012  . Tonsillar mass 12/01/2012  . 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 Leukocytosis   . Anemia   . High cholesterol   . Hypertension   . Blurred vision 09/11/2011  . Foot pain 09/11/2011  . Breast pain 09/11/2011  . Morbid obesity ([place]) 09/11/2011  . Fungal infection of nail 09/11/2011   . Avitaminosis D 09/11/2011    Arliss Journey, PT, DPT  10/02/2019, 1:22 PM  [facility] [address] [place], [place], [address] Phone: [phone]   Fax:  [phone]  Name: Kaitlin [name] MRN: [id number] Date of Birth: May 21, 1965

## 2019-10-03 ENCOUNTER — Ambulatory Visit: Payer: Medicaid Other | Admitting: Occupational Therapy

## 2019-10-03 DIAGNOSIS — R41840 Attention and concentration deficit: Secondary | ICD-10-CM

## 2019-10-03 DIAGNOSIS — R2681 Unsteadiness on feet: Secondary | ICD-10-CM | POA: Diagnosis not present

## 2019-10-03 DIAGNOSIS — M79601 Pain in right arm: Secondary | ICD-10-CM

## 2019-10-03 DIAGNOSIS — M79602 Pain in left arm: Secondary | ICD-10-CM

## 2019-10-03 DIAGNOSIS — M6281 Muscle weakness (generalized): Secondary | ICD-10-CM

## 2019-10-03 DIAGNOSIS — M25612 Stiffness of left shoulder, not elsewhere classified: Secondary | ICD-10-CM

## 2019-10-03 NOTE — Therapy (Signed)
[facility] [address] [place], [place], [address] Phone: [phone]   Fax:  [phone]  Occupational Therapy Evaluation  Patient Details  Name: Kaitlin [name] MRN: [id number] Date of Birth: [birth year] No data recorded  Encounter Date: 10/03/2019   OT End of Session - 10/03/19 1216    Visit Number 1    Number of Visits 13    Date for OT Re-Evaluation 11/16/19    Authorization Type [employer] - awaiting authorization    OT Start Time 1105    OT Stop Time 1150    OT Time Calculation (min) 45 min    Activity Tolerance Patient tolerated treatment well    Behavior During Therapy [facility] for tasks assessed/performed           Past Medical History:  Diagnosis Date  . Anemia   . Bronchitis   . Cataracts, bilateral 01/12/2018  . CHF (congestive heart failure) ([place])   . Diabetes mellitus   . High cholesterol   . Hypertension    takes medicine to protect kidneys, does not have HTN  . Leukocytosis   . Morbid obesity ([place]) 09/11/2011   BMI 57  . Neuropathy 01/12/2018  . NICM (nonischemic cardiomyopathy) ([place]) 05/01/2013   Overview:  Last Assessment & Plan:  euvolemic on physical exam.   . Obesity   . Sickle cell trait ([place])   . Thymoma     Past Surgical History:  Procedure Laterality Date  . COLONOSCOPY N/A 06/21/2017   Procedure: COLONOSCOPY;  Surgeon: Ronnette Juniper, MD;  Location: [facility];  Service: Gastroenterology;  Laterality: N/A;  . LEFT HEART CATHETERIZATION WITH CORONARY ANGIOGRAM N/A 05/25/2013   Procedure: LEFT HEART CATHETERIZATION WITH CORONARY ANGIOGRAM;  Surgeon: Troy Sine, MD;  Location: [facility] CATH LAB;  Service: Cardiovascular;  Laterality: N/A;  . LYMPH NODE BIOPSY Right 11/30/2012   Procedure: RIGHT TONSIL BIOPSY WITH FRESH FROZEN ANALYSIS;  Surgeon: Jodi Marble, MD;  Location: [facility];  Service: ENT;  Laterality: Right;  . MEDIASTERNOTOMY N/A 01/12/2013   Procedure: MEDIAN STERNOTOMY;   Surgeon: Gaye Pollack, MD;  Location: [facility];  Service: Thoracic;  Laterality: N/A;  . MEDIASTINOTOMY CHAMBERLAIN MCNEIL Right 11/06/2012   Procedure: MEDIASTINOTOMY CHAMBERLAIN MCNEILPROCEDURE;  Surgeon: Gaye Pollack, MD;  Location: [facility];  Service: Thoracic;  Laterality: Right;  . POLYPECTOMY  06/21/2017   Procedure: POLYPECTOMY;  Surgeon: Ronnette Juniper, MD;  Location: [facility];  Service: Gastroenterology;;  . RESECTION OF A THYMOMA N/A 01/12/2013   Procedure: RESECTION OF A THYMOMA;  Surgeon: Gaye Pollack, MD;  Location: [facility];  Service: Thoracic;  Laterality: N/A;  . TONSILLECTOMY    . TUBAL LIGATION      There were no vitals filed for this visit.   Subjective Assessment - 10/03/19 1108    Pertinent History ICU myopathy s/p covid w/ hypoxemic respiratory failure d/t ARDS in [name] 2021. PMH: CHF, T2DM, sickle cell trait    Limitations monitor vitals prn, BP taken at Lt forearm    Patient Stated Goals increase reaching and endurance in my arms    Currently in Pain? Yes    Pain Score 5     Pain Location Arm    Pain Orientation Right;Left    Pain Descriptors / Indicators Heaviness;Aching;Burning    Pain Type Chronic pain    Pain Onset More than a month ago    Pain Frequency Intermittent    Aggravating Factors  Rainy/cold weather, lifting, sleeping  wrong way    Pain Relieving Factors cold pack             [facility] OT Assessment - 10/03/19 0001      Assessment   Medical Diagnosis critical illness myopathy s/p Covid    Onset Date/Surgical Date 01/12/19    Hand Dominance Right    Prior Therapy inpatient rehab, HHOT/PT      Precautions   Precaution Comments pt's BP taken on L forearm (pt reports this is how they do it at the [facility] and how she does it at home)      Balance Screen   Has the patient fallen in the past 6 months No      Home  Environment   Bathroom Shower/Tub Tub/Shower unit;Curtain   tub bench    [place] bench;Walker - 4 wheels;Wheelchair -  manual;Bedside commode    Additional Comments Pt lives on 1st floor apartment w/ family.     Lives With Family      Prior Function   Level of Independence Independent    Vocation Requirements used to be a Freight forwarder for [employer], now on long term disability    Leisure would want to go to church more and stand up more, do more things with her grandchildren      ADL   Eating/Feeding Modified independent    Grooming Modified independent    Upper Body Bathing Modified independent    Lower Body Bathing Modified independent    Upper Body Dressing Increased time    Lower Body Dressing Increased time   use Optician, dispensing Modified independent    Tub/Shower Transfer Modified independent      IADL   Shopping Needs to be accompanied on any shopping trip   shorter trips or ride scooter   Light Housekeeping --   washes dishes some, does some laundry   Meal Prep Able to complete simple cold meal and snack prep;Able to complete simple warm meal prep   reports cooking breakfast foods   Product/process development scientist Status Independent      Written Expression   Dominant Hand Right    Handwriting --   denies change     Vision - History   Baseline Vision Wears glasses all the time    Visual History Cataracts    Additional Comments Pt reports some blurred vision and difficulty w/ small print, and night vision worse. ? mild Lt visual field cut - tested normal in clinic but delayed response to Lt      Activity Tolerance   Activity Tolerance Tolerates < 10 min activity with changes in vital signs      Cognition   Cognition Comments Pt reports decreased memory, forgets words, brain fog      Observation/Other Assessments   Observations Decreased endurance, standing tolerance, BUE weakness and pain in arms      Sensation   Light Touch Appears Intact   BUE's   Additional Comments Pt does report numbness/tinglings in fingertips      Coordination   9 Hole  Peg Test Right;Left    Right 9 Hole Peg Test 25.06 sec    Left 9 Hole Peg Test 25.38 sec      Edema   Edema none      ROM / Strength   AROM / PROM / Strength AROM;Strength      AROM   Overall AROM Comments BUE  AROM WFL's except end range shoulder flexion limited especially on Lt side. Pt also w/ decreased endurance BUE's      Strength   Overall Strength Comments BUE MMT grossly 3+/5      Hand Function   Right Hand Grip (lbs) 66.7 lbs    Left Hand Grip (lbs) 64.5 lbs                             OT Short Term Goals - 10/03/19 1421      OT SHORT TERM GOAL #1   Title Independent with BUE A/ROM HEP    Baseline Dependent    Time 3    Period Weeks    Status New      OT SHORT TERM GOAL #2   Title Pt to verbalize understanding with energy conservation techniques and memory strategies    Baseline dependent    Time 3    Period Weeks    Status New      OT SHORT TERM GOAL #3   Title Pt to verbalize understanding with task modifications and potential A/E needs to increase ease with ADLS    Baseline Dependent    Time 3    Period Weeks    Status New             OT Long Term Goals - 10/03/19 1423      OT LONG TERM GOAL #1   Title Pt will attend to Lt side with environmental scanning at 90% accuracy    Baseline has not yet attempted but reports occaisonally missing things on Lt side    Time 6    Period Weeks    Status New      OT LONG TERM GOAL #2   Title Pt to stand for 8 minutes for light IADL tasks w/ no major changes to vitals    Baseline 2-3 minutes    Time 6    Period Weeks    Status New      OT LONG TERM GOAL #3   Title Independent with strengtheing HEP for BUE's    Baseline dependent    Time 6    Period Weeks    Status New      OT LONG TERM GOAL #4   Title Pt to perform repetitive and sustained reaching x 2 minutes each UE for putting away groceries and dishes    Baseline can only do a few reps before fatiguing    Time 6    Period  Weeks    Status New                 Plan - 10/03/19 1229    Clinical Impression Statement Pt is a 54 y.o. female who presents to [facility] w/ critical illness myopathy s/p Covid w/ ARDS in [name] 2021. Pt presents today with overall decreased strength, endurance, bilateral shoulder weakness and pain and would benefit from O.T. to address these deficits, improve overall quality of life, and address any taks modifications and A/E needs. PMH includes: anemia, CHF, diabetes mellitus, HTN, neuropathy, sickle cell trait, obesity    OT Occupational Profile and History Detailed Assessment- Review of Records and additional review of physical, cognitive, psychosocial history related to current functional performance    Occupational performance deficits (Please refer to evaluation for details): ADL's;IADL's;Leisure;Social Participation    Body Structure / Function / Physical Skills ADL;IADL;ROM;Endurance;Cardiopulmonary status limiting activity;Strength;UE functional use;Pain;Mobility;Decreased knowledge of use of  DME;Vision    Cognitive Skills Attention;Memory    Rehab Potential Good    Clinical Decision Making Limited treatment options, no task modification necessary    Comorbidities Affecting Occupational Performance: May have comorbidities impacting occupational performance    Modification or Assistance to Complete Evaluation  No modification of tasks or assist necessary to complete eval    OT Frequency 2x / week    OT Duration 6 weeks   plus eval   OT Treatment/Interventions Self-care/ADL training;Therapeutic exercise;Functional Mobility Training;Neuromuscular education;Therapeutic activities;Coping strategies training;DME and/or AE instruction;Cognitive remediation/compensation;Visual/perceptual remediation/compensation;Passive range of motion;Patient/family education    Plan BUE Cane HEP, energy conservation techniques, memory strategies (following session: A/E recommendations - LH sponge, sock aide, ?  cooking aides)    Consulted and Agree with Plan of Care Patient           Patient will benefit from skilled therapeutic intervention in order to improve the following deficits and impairments:   Body Structure / Function / Physical Skills: ADL, IADL, ROM, Endurance, Cardiopulmonary status limiting activity, Strength, UE functional use, Pain, Mobility, Decreased knowledge of use of DME, Vision Cognitive Skills: Attention, Memory     Visit Diagnosis: Muscle weakness (generalized)  Unsteadiness on feet  Stiffness of left shoulder, not elsewhere classified  Pain in left arm  Pain in right arm    Problem List Patient Active Problem List   Diagnosis Date Noted  . Mixed stress and urge urinary incontinence 07/04/2019  . Nerve pain 05/16/2019  . Myofascial pain 05/16/2019  . Subluxation of acromioclavicular joint, right, sequela 03/23/2019  . Sickle cell trait ([place])   . Labile blood glucose   . Diabetic peripheral neuropathy ([place])   . Hypokalemia   . Pain in joint of left shoulder   . Debility   . 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 COVID-19   . History of anemia   . Super obese   . Poorly controlled type 2 diabetes mellitus with peripheral neuropathy ([place])   . Acute on chronic diastolic (congestive) heart failure ([place])   . Hypernatremia   . Shock circulatory ([place])   . Septic shock ([place])   . Hypotension 01/13/2019  . Acute respiratory failure with hypoxemia ([place]) 01/13/2019  . Acute respiratory distress syndrome (ARDS) due to COVID-19 virus ([place]) 01/12/2019  . Grade I diastolic dysfunction 88/50/2774  . 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 Dyspnea on exertion 05/19/2016  . Other fatigue 05/19/2016  . Absolute anemia 04/15/2016  . Left leg weakness 10/18/2014  . Left leg paresthesias 10/18/2014  . Acute left-sided weakness 10/18/2014  . Encounter for central line placement 03/08/2014  . Taking drug for chronic disease 03/08/2014  . H/O insertion of insulin pump  02/18/2014  . Long term current use of insulin ([place]) 02/18/2014  . Insulin pump in place 02/18/2014  . 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 Abnormal ballistocardiogram 05/18/2013  . Abnormal nuclear stress test 05/18/2013  . 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 NICM (nonischemic cardiomyopathy) ([place]) 05/01/2013  . Insulin dependent type 2 diabetes mellitus ([place]) 04/10/2013  . Sex counseling 04/10/2013  . Edema leg 04/10/2013  . Upper respiratory infection, viral 04/10/2013  . Pedal edema 03/29/2013  . Pneumonia 12/12/2012  . Essential hypertension 12/12/2012  . HLD (hyperlipidemia) 12/12/2012  . Tonsillar mass 12/01/2012  . 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 Leukocytosis   . Anemia   . High cholesterol   . Hypertension   . Blurred vision 09/11/2011  . Foot  pain 09/11/2011  . Breast pain 09/11/2011  . Morbid obesity ([place]) 09/11/2011  . Fungal infection of nail 09/11/2011  . Avitaminosis D 09/11/2011    Carey Bullocks, OTR/L 10/03/2019, 2:30 PM  [facility] [address] [place], [place], [address] Phone: [phone]   Fax:  [phone]  Name: CHEZNEY HUETHER MRN: [id number] Date of Birth: Sep 03, 1965

## 2019-10-09 ENCOUNTER — Ambulatory Visit: Payer: Medicaid Other | Admitting: Physical Therapy

## 2019-10-10 ENCOUNTER — Encounter: Payer: Self-pay | Admitting: Physical Therapy

## 2019-10-10 ENCOUNTER — Other Ambulatory Visit: Payer: Self-pay

## 2019-10-10 ENCOUNTER — Ambulatory Visit: Payer: Medicaid Other | Admitting: Physical Therapy

## 2019-10-10 DIAGNOSIS — M6281 Muscle weakness (generalized): Secondary | ICD-10-CM

## 2019-10-10 DIAGNOSIS — R2689 Other abnormalities of gait and mobility: Secondary | ICD-10-CM

## 2019-10-10 DIAGNOSIS — R2681 Unsteadiness on feet: Secondary | ICD-10-CM

## 2019-10-10 NOTE — Therapy (Signed)
[facility] [address] [place], [place], [address] Phone: [phone]   Fax:  [phone]  Physical Therapy Treatment  Patient Details  Name: Kaitlin [name] MRN: [id number] Date of Birth: 1965-04-03 Referring Provider (PT): Dr. Courtney Heys    Encounter Date: 10/10/2019   PT End of Session - 10/10/19 0725    Visit Number 6    Number of Visits 13    Date for PT Re-Evaluation --   awaiting Medicaid auth   Authorization Type Medicaid - 12 PT visits approved from 09/21/19 to 10/19/19    Authorization - Visit Number 5    Authorization - Number of Visits 12    PT Start Time 0720    PT Stop Time 0800    PT Time Calculation (min) 40 min    Activity Tolerance Patient tolerated treatment well;Patient limited by fatigue    Behavior During Therapy [facility] for tasks assessed/performed           Past Medical History:  Diagnosis Date  . Anemia   . Bronchitis   . Cataracts, bilateral 01/12/2018  . CHF (congestive heart failure) ([place])   . Diabetes mellitus   . High cholesterol   . Hypertension    takes medicine to protect kidneys, does not have HTN  . Leukocytosis   . Morbid obesity ([place]) 09/11/2011   BMI 57  . Neuropathy 01/12/2018  . NICM (nonischemic cardiomyopathy) ([place]) 05/01/2013   Overview:  Last Assessment & Plan:  euvolemic on physical exam.   . Obesity   . Sickle cell trait ([place])   . Thymoma     Past Surgical History:  Procedure Laterality Date  . COLONOSCOPY N/A 06/21/2017   Procedure: COLONOSCOPY;  Surgeon: Ronnette Juniper, MD;  Location: [facility];  Service: Gastroenterology;  Laterality: N/A;  . LEFT HEART CATHETERIZATION WITH CORONARY ANGIOGRAM N/A 05/25/2013   Procedure: LEFT HEART CATHETERIZATION WITH CORONARY ANGIOGRAM;  Surgeon: Troy Sine, MD;  Location: [facility];  Service: Cardiovascular;  Laterality: N/A;  . LYMPH NODE BIOPSY Right 11/30/2012   Procedure: RIGHT TONSIL BIOPSY WITH FRESH FROZEN  ANALYSIS;  Surgeon: Jodi Marble, MD;  Location: [place];  Service: ENT;  Laterality: Right;  . MEDIASTERNOTOMY N/A 01/12/2013   Procedure: MEDIAN STERNOTOMY;  Surgeon: Gaye Pollack, MD;  Location: [place];  Service: Thoracic;  Laterality: N/A;  . MEDIASTINOTOMY CHAMBERLAIN MCNEIL Right 11/06/2012   Procedure: MEDIASTINOTOMY CHAMBERLAIN MCNEILPROCEDURE;  Surgeon: Gaye Pollack, MD;  Location: [place];  Service: Thoracic;  Laterality: Right;  . POLYPECTOMY  06/21/2017   Procedure: POLYPECTOMY;  Surgeon: Ronnette Juniper, MD;  Location: [facility];  Service: Gastroenterology;;  . RESECTION OF A THYMOMA N/A 01/12/2013   Procedure: RESECTION OF A THYMOMA;  Surgeon: Gaye Pollack, MD;  Location: [place];  Service: Thoracic;  Laterality: N/A;  . TONSILLECTOMY    . TUBAL LIGATION      There were no vitals filed for this visit.   Subjective Assessment - 10/10/19 0723    Subjective No new complaints. No falls. Some right knee pain still.    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 2-3 minutes    How long can you walk comfortably? 150' approx. back into therapy gym    Patient Stated Goals wants to be able to walk normal and not feel like she is going to topple over. standing up  is difficult    Currently in Pain? Yes    Pain Score 5     Pain Location Generalized   right knee, bil upper arms   Pain Orientation Right;Left    Pain Descriptors / Indicators Aching;Burning;Heaviness    Pain Type Chronic pain;Acute pain   arms are new since Covid   Pain Onset More than a month ago    Pain Frequency Intermittent    Aggravating Factors  rainy/cold weather, lifting, sleeping the wrong way    Pain Relieving Factors medication, ice packs                   [facility] Adult PT Treatment/Exercise - 10/10/19 0726      Transfers   Transfers Sit to Stand;Stand to Sit    Sit to Stand 5: Supervision     Stand to Sit 5: Supervision;Without upper extremity assist      Ambulation/Gait   Ambulation/Gait Yes    Ambulation/Gait Assistance 5: Supervision    Ambulation/Gait Assistance Details HR 98, SaO2 96% pre gait. HR 116, SaO2 96% after gait,- 1st rep.  Mild shortness of breath after gait with recovery during rest break.  HR 98, SaO2 97% prior.  HR 120, SaO2 96% after 2cd gait rep. Mild shortness of breath. All recovered within 1-2 minutes with seated rest break.     Ambulation Distance (Feet) 230 Feet   x2 reps   Assistive device None    Gait Pattern Step-through pattern;Decreased arm swing - right;Decreased arm swing - left;Decreased hip/knee flexion - right;Decreased hip/knee flexion - left;Right foot flat;Left foot flat;Wide base of support;Lateral hip instability    Ambulation Surface Level;Indoor      High Level Balance   High Level Balance Activities Side stepping;Marching forwards;Backward walking;Tandem walking   tandem gait fwd/bwd   High Level Balance Comments in parallel bars with no to light UE support on bars; 3 laps each/each way with cues on posture, ex form and technique. min guard to min assist for balance.                 Balance Exercises - 10/10/19 0750      Balance Exercises: Standing   Standing Eyes Closed Wide (BOA);Foam/compliant surface;2 reps;30 secs;Limitations    Standing Eyes Closed Limitations on airex with feet hip width apart, min guard to min assist with cues on posture and weight shifting to assist with balance.     SLS with Vectors Solid surface;Upper extremity assist 2;Other reps (comment);Limitations    SLS with Vectors Limitations with bil light UE support on bars- alternating fwd foot taps to foam bubbles for 8-9 reps each side. cues for light taps to work on controlled movements               PT Short Term Goals - 09/04/19 1154      PT SHORT TERM GOAL #1   Title Pt will be independent with initial HEP in order to build upon functional  gains made in therapy. ALL STGS DUE AFTER 6 VISITS.    Baseline currently dependent.    Time 6   visits   Status New      PT SHORT TERM GOAL #2   Title Pt will undergo BERG/DGI when pt is able to tolerate and LTG written as appropriate.    Baseline unable to perform due to time constraints and decr endurance.    Time 6    Period --   visits   Status New  PT SHORT TERM GOAL #3   Title Pt will ambulate at least 150' with RPE rating at 6-7/10 or less with supervision in order to improve functional mobility.    Baseline 8-9/10 RPE after 100'    Time 6   visits   Status New      PT SHORT TERM GOAL #4   Title Pt will perform 5x sit <> stand wit BUE support in 24 seconds or less in order to demo improved BLE strength.    Baseline 27.87 seconds from 21" mat table    Time 6   visits   Status New             PT Long Term Goals - 09/04/19 1606      PT LONG TERM GOAL #1   Title Pt will be independent with final HEP in order to build upon functional gains made in therapy. ALL LTGS DUE AFTER 13 VISITS.    Baseline dependent    Time 13   visits   Status New      PT LONG TERM GOAL #2   Title BERG/DGI goal to be written as appropriate.    Baseline not yet assessed    Time 13   visits   Status New      PT LONG TERM GOAL #3   Title Pt will ambulate at least 300' over indoor/outdoor surfaces with RPE rating at 5-6/10 or less with supervision in order to improve functional mobility.    Baseline 8-9/10 RPE after 100' indoors    Time 13   visits   Status New      PT LONG TERM GOAL #4   Title Pt will perform 5x sit <> stand with BUE support in 20 seconds or less in order to demo improved BLE strength.    Baseline 27.87 seconds from 21" mat table    Time 13   visits   Status New      PT LONG TERM GOAL #5   Title Pt will improve gait speed to at least 2.8 ft/sec in order to demo improved community mobility.    Baseline 2.44 ft/sec    Time 13   visits   Status New                  Plan - 10/10/19 0725    Clinical Impression Statement Today's skilled session focused on activity tolerance, strengthening and balance reactions with rest breaks taken to allow pt's HR to return close to baseline values. SaO2 levels remainded stable with activity. The pt is progressing toward goals and should benefit from continued PT to progress toward unmet goals.    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, CHF, diabetes mellitus, HTN, neuropathy, obesity    Examination-Activity Limitations Stand;Sit;Transfers;Stairs;Squat;Locomotion Level    Examination-Participation Restrictions Church;Community Activity    Stability/Clinical Decision Making Evolving/Moderate complexity    Rehab Potential Good    PT Frequency 2x / week    PT Duration 6 weeks    PT Treatment/Interventions ADLs/Self Care Home Management;Aquatic Therapy;DME Instruction;Gait training;Stair training;Functional mobility training;Therapeutic activities;Balance training;Therapeutic exercise;Neuromuscular re-education;Patient/family education;Energy conservation    PT Next Visit Plan Check STGs! monitor O2/HR. seated and standing functional strengthening, perform DGI when approrpriate (pt fatigues easily), standing balance    PT Home Exercise Plan [facility]    Consulted and Agree with Plan of Care Patient  Patient will benefit from skilled therapeutic intervention in order to improve the following deficits and impairments:  Abnormal gait, Cardiopulmonary status limiting activity, Decreased activity tolerance, Decreased balance, Decreased coordination, Decreased endurance, Decreased mobility, Difficulty walking, Decreased strength, Impaired sensation, Postural dysfunction, Obesity  Visit Diagnosis: Muscle weakness (generalized)  Unsteadiness on feet  Other  abnormalities of gait and mobility     Problem List Patient Active Problem List   Diagnosis Date Noted  . Mixed stress and urge urinary incontinence 07/04/2019  . Nerve pain 05/16/2019  . Myofascial pain 05/16/2019  . Subluxation of acromioclavicular joint, right, sequela 03/23/2019  . Sickle cell trait ([place])   . Labile blood glucose   . Diabetic peripheral neuropathy ([place])   . Hypokalemia   . Pain in joint of left shoulder   . Debility   . 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 COVID-19   . History of anemia   . Super obese   . Poorly controlled type 2 diabetes mellitus with peripheral neuropathy ([place])   . Acute on chronic diastolic (congestive) heart failure ([place])   . Hypernatremia   . Shock circulatory ([place])   . Septic shock ([place])   . Hypotension 01/13/2019  . Acute respiratory failure with hypoxemia ([place]) 01/13/2019  . Acute respiratory distress syndrome (ARDS) due to COVID-19 virus ([place]) 01/12/2019  . Grade I diastolic dysfunction 54/65/0354  . 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 Dyspnea on exertion 05/19/2016  . Other fatigue 05/19/2016  . Absolute anemia 04/15/2016  . Left leg weakness 10/18/2014  . Left leg paresthesias 10/18/2014  . Acute left-sided weakness 10/18/2014  . Encounter for central line placement 03/08/2014  . Taking drug for chronic disease 03/08/2014  . H/O insertion of insulin pump 02/18/2014  . Long term current use of insulin ([place]) 02/18/2014  . Insulin pump in place 02/18/2014  . 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 Abnormal ballistocardiogram 05/18/2013  . Abnormal nuclear stress test 05/18/2013  . 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 NICM (nonischemic cardiomyopathy) ([place]) 05/01/2013  . Insulin dependent type 2 diabetes mellitus ([place]) 04/10/2013  . Sex counseling 04/10/2013  . Edema leg 04/10/2013  . Upper respiratory infection, viral 04/10/2013  . Pedal edema 03/29/2013  . Pneumonia 12/12/2012  . Essential hypertension  12/12/2012  . HLD (hyperlipidemia) 12/12/2012  . Tonsillar mass 12/01/2012  . 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 Leukocytosis   . Anemia   . High cholesterol   . Hypertension   . Blurred vision 09/11/2011  . Foot pain 09/11/2011  . Breast pain 09/11/2011  . Morbid obesity ([place]) 09/11/2011  . Fungal infection of nail 09/11/2011  . Avitaminosis D 09/11/2011   Willow Ora, PTA, [place] [address] [place], [place] [address] [phone] 10/10/19, 9:42 PM   Name: Kaitlin [name] MRN: [id number] Date of Birth: 03/30/1965

## 2019-10-12 ENCOUNTER — Ambulatory Visit: Payer: Medicaid Other | Admitting: Physical Therapy

## 2019-10-15 ENCOUNTER — Encounter: Payer: Medicaid Other | Admitting: Physical Medicine and Rehabilitation

## 2019-10-16 ENCOUNTER — Ambulatory Visit: Payer: Medicaid Other | Admitting: Physical Therapy

## 2019-10-19 ENCOUNTER — Ambulatory Visit: Payer: Medicaid Other | Attending: Physical Medicine and Rehabilitation | Admitting: Physical Therapy

## 2019-10-19 ENCOUNTER — Encounter: Payer: Self-pay | Admitting: Physical Therapy

## 2019-10-19 ENCOUNTER — Other Ambulatory Visit: Payer: Self-pay

## 2019-10-19 DIAGNOSIS — M6281 Muscle weakness (generalized): Secondary | ICD-10-CM | POA: Insufficient documentation

## 2019-10-19 DIAGNOSIS — R262 Difficulty in walking, not elsewhere classified: Secondary | ICD-10-CM

## 2019-10-19 DIAGNOSIS — R2681 Unsteadiness on feet: Secondary | ICD-10-CM

## 2019-10-19 DIAGNOSIS — M25612 Stiffness of left shoulder, not elsewhere classified: Secondary | ICD-10-CM | POA: Insufficient documentation

## 2019-10-19 DIAGNOSIS — M79601 Pain in right arm: Secondary | ICD-10-CM | POA: Diagnosis present

## 2019-10-19 DIAGNOSIS — M79602 Pain in left arm: Secondary | ICD-10-CM | POA: Insufficient documentation

## 2019-10-19 DIAGNOSIS — R41840 Attention and concentration deficit: Secondary | ICD-10-CM | POA: Insufficient documentation

## 2019-10-19 DIAGNOSIS — R2689 Other abnormalities of gait and mobility: Secondary | ICD-10-CM

## 2019-10-19 NOTE — Therapy (Addendum)
[facility] [address] [place], [place], [address] Phone: [phone]   Fax:  [phone]  Physical Therapy Treatment/Medicaid Re-auth  Patient Details  Name: Kaitlin [name] MRN: [id number] Date of Birth: 05/20/1965 Referring Provider (PT): Dr. Courtney Heys    Encounter Date: 10/19/2019   10/19/19 1622  PT Visits / Re-Eval  Visit Number 7  Number of Visits 15  Date for PT Re-Evaluation  (awaiting Medicaid  re-auth)  Authorization  Authorization Type Medicaid - 12 PT visits approved from 09/21/19 to 10/19/19, awaiting re-auth  Authorization - Visit Number 6  Authorization - Number of Visits 12  PT Time Calculation  PT Start Time 1230  PT Stop Time 1310  PT Time Calculation (min) 40 min  PT - End of Session  Activity Tolerance Patient tolerated treatment well;Patient limited by fatigue  Behavior During Therapy [facility] for tasks assessed/performed    Past Medical History:  Diagnosis Date  . Anemia   . Bronchitis   . Cataracts, bilateral 01/12/2018  . CHF (congestive heart failure) ([place])   . Diabetes mellitus   . High cholesterol   . Hypertension    takes medicine to protect kidneys, does not have HTN  . Leukocytosis   . Morbid obesity ([place]) 09/11/2011   BMI 57  . Neuropathy 01/12/2018  . NICM (nonischemic cardiomyopathy) ([place]) 05/01/2013   Overview:  Last Assessment & Plan:  euvolemic on physical exam.   . Obesity   . Sickle cell trait ([place])   . Thymoma     Past Surgical History:  Procedure Laterality Date  . COLONOSCOPY N/A 06/21/2017   Procedure: COLONOSCOPY;  Surgeon: Ronnette Juniper, MD;  Location: [facility];  Service: Gastroenterology;  Laterality: N/A;  . LEFT HEART CATHETERIZATION WITH CORONARY ANGIOGRAM N/A 05/25/2013   Procedure: LEFT HEART CATHETERIZATION WITH CORONARY ANGIOGRAM;  Surgeon: Troy Sine, MD;  Location: [facility] CATH LAB;  Service: Cardiovascular;  Laterality: N/A;  . LYMPH NODE BIOPSY  Right 11/30/2012   Procedure: RIGHT TONSIL BIOPSY WITH FRESH FROZEN ANALYSIS;  Surgeon: Jodi Marble, MD;  Location: [facility];  Service: ENT;  Laterality: Right;  . MEDIASTERNOTOMY N/A 01/12/2013   Procedure: MEDIAN STERNOTOMY;  Surgeon: Gaye Pollack, MD;  Location: [facility];  Service: Thoracic;  Laterality: N/A;  . MEDIASTINOTOMY CHAMBERLAIN MCNEIL Right 11/06/2012   Procedure: MEDIASTINOTOMY CHAMBERLAIN MCNEILPROCEDURE;  Surgeon: Gaye Pollack, MD;  Location: [facility];  Service: Thoracic;  Laterality: Right;  . POLYPECTOMY  06/21/2017   Procedure: POLYPECTOMY;  Surgeon: Ronnette Juniper, MD;  Location: [facility];  Service: Gastroenterology;;  . RESECTION OF A THYMOMA N/A 01/12/2013   Procedure: RESECTION OF A THYMOMA;  Surgeon: Gaye Pollack, MD;  Location: [facility];  Service: Thoracic;  Laterality: N/A;  . TONSILLECTOMY    . TUBAL LIGATION      There were no vitals filed for this visit.   Subjective Assessment - 10/19/19 1233    Subjective Didn't do much walking this week. Feels like she is improving. Had to miss a couple appts due to having a cold.    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 2-3 minutes    How long can you walk comfortably? 150' approx. back into therapy gym    Patient Stated Goals wants to be able to walk normal and not feel like she is going to topple over. standing up is difficult  Currently in Pain? Yes   also having pain on outside of R foot   Pain Location Knee    Pain Orientation Right    Pain Descriptors / Indicators Aching    Pain Type Chronic pain    Pain Onset More than a month ago    Aggravating Factors  rainy/cold weather    Pain Relieving Factors tylenol                             [facility] Adult PT Treatment/Exercise - 10/19/19 1238      Transfers   Transfers Sit to Stand;Stand to Sit    Sit to Stand 5: Supervision;Without upper  extremity assist    Five time sit to stand comments  14.12 seconds without UE support from 21" mat table 5/10 RPE    Stand to Sit 5: Supervision;Without upper extremity assist    Comments 30 second chair stand from 21" mat table: pre O2 96%, HR 94 bpm - 10 sit <> stands, post O2 99% HR 108 bpm       Ambulation/Gait   Ambulation/Gait Yes    Ambulation/Gait Assistance 5: Supervision    Ambulation/Gait Assistance Details pre HR 92, O2 at 98%, rating fatigue as 5-6/10 afterwards, post O2 at 98% HR at 113 bpm    Ambulation Distance (Feet) 230 Feet   x1   Assistive device None    Gait Pattern Step-through pattern;Decreased arm swing - right;Decreased arm swing - left;Decreased hip/knee flexion - right;Decreased hip/knee flexion - left;Right foot flat;Left foot flat;Wide base of support;Lateral hip instability    Ambulation Surface Level;Indoor    Stairs Yes    Stairs Assistance 5: Supervision    Stairs Assistance Details (indicate cue type and reason) rating 5-6/10 exertion after performing    Stair Management Technique Two rails;Alternating pattern;Forwards    Number of Stairs 4    Height of Stairs 6    Gait Comments attempted 20' gait with eyes closed - pt only able to walk approx. 15' before needing to open eyes due to imbalance/drifting      Standardized Balance Assessment   Standardized Balance Assessment Dynamic Gait Index      Dynamic Gait Index   Level Surface Normal    Change in Gait Speed Normal    Gait with Horizontal Head Turns Normal    Gait with Vertical Head Turns Normal    Gait and Pivot Turn Normal    Step Over Obstacle Mild Impairment    Step Around Obstacles Normal    Steps Mild Impairment    Total Score 22      High Level Balance   High Level Balance Comments mCTSIB:                     PT Short Term Goals - 10/19/19 1235      PT SHORT TERM GOAL #1   Title Pt will be independent with initial HEP in order to build upon functional gains made in  therapy. ALL STGS DUE AFTER 6 VISITS.    Baseline has not been performing consistently lately due to not feeling well.    Time 6   visits   Status On-going      PT SHORT TERM GOAL #2   Title Pt will undergo BERG/DGI when pt is able to tolerate and LTG written as appropriate.    Baseline DGI assessed on 10/19/19 - 22/24    Time  6    Period --   visits   Status Achieved      PT SHORT TERM GOAL #3   Title Pt will ambulate at least 150' with RPE rating at 6-7/10 or less with supervision in order to improve functional mobility.    Baseline 230' with RPE at 5/10 afterwards.    Time 6   visits   Status Achieved      PT SHORT TERM GOAL #4   Title Pt will perform 5x sit <> stand wit BUE support in 24 seconds or less in order to demo improved BLE strength.    Baseline 27.87 seconds from 21" mat table, without UE support: 14.12 seconds on 10/19/19    Time 6   visits   Status Achieved            PT Long Term Goals - 09/04/19 1606      PT LONG TERM GOAL #1   Title Pt will be independent with final HEP in order to build upon functional gains made in therapy. ALL LTGS DUE AFTER 13 VISITS.    Baseline dependent    Time 13   visits   Status New      PT LONG TERM GOAL #2   Title BERG/DGI goal to be written as appropriate.    Baseline not yet assessed    Time 13   visits   Status New      PT LONG TERM GOAL #3   Title Pt will ambulate at least 300' over indoor/outdoor surfaces with RPE rating at 5-6/10 or less with supervision in order to improve functional mobility.    Baseline 8-9/10 RPE after 100' indoors    Time 13   visits   Status New      PT LONG TERM GOAL #4   Title Pt will perform 5x sit <> stand with BUE support in 20 seconds or less in order to demo improved BLE strength.    Baseline 27.87 seconds from 21" mat table    Time 13   visits   Status New      PT LONG TERM GOAL #5   Title Pt will improve gait speed to at least 2.8 ft/sec in order to demo improved community  mobility.    Baseline 2.44 ft/sec    Time 13   visits   Status New          Revised/ongoing LTGs for Medicaid re-auth:     PT Long Term Goals - 10/22/19 2000      PT LONG TERM GOAL #1   Title Pt will be independent with final HEP in order to build upon functional gains made in therapy. ALL LTGS DUE AFTER 15 VISITS.    Baseline pt has not been performing at home.    Time 15   visits   Status New      PT LONG TERM GOAL #2   Title pt will undergo further assessment of 3MWT - with LTG to be written as appropriate.    Baseline not yet assessed    Time 15   visits   Status New      PT LONG TERM GOAL #3   Title Pt will ambulate at least 300' over indoor surfaces with RPE rating at 3-4/10 or less with supervision in order to improve functional mobility.    Baseline 5-6/10 RPE after 230' indoors    Time 15   visits   Status Revised  PT LONG TERM GOAL #4   Title Pt will improve 30 second chair stand to at least 11 sit <> stands from standard height mat table to demo improved functional BLE strength.    Baseline 10 sit <> stands from 21" mat table    Time 15   visits   Status Revised      PT LONG TERM GOAL #5   Title Pt will improve gait speed to at least 2.8 ft/sec in order to demo improved community mobility.    Baseline 2.44 ft/sec    Time 15   visits   Status New              10/22/19 2027  Plan  Clinical Impression Statement Focus of today's skilled session was assessing pt's STGs for Medicaid re-auth. Pt has had to cancel some recent PT sessions - today is auth visit #6. Pt met 3 out of 4 STGs. Pt's O2 and HR remained stable throughout session with pt needing seated rest breaks as needed due to fatigue. Performed the DGI today with pt scoring a 22/24 indicating that pt is at a low fall risk. Pt with significant improvement with 5x sit <> stand - able to perform today in 14.12 seconds without UE support (previously was 27.87 seconds with BUE support). Pt only able to  walk a max distance of 230' indoors before needing a seated rest break while rating an RPE of 5/10. Pt will continue to benefit from skilled PT in order to address endurance, functional BLE strength, balance, and gait in order to improve functional mobility and endurance. Will ask for re-auth for an additional 2x week for 4 weeks. LTGs revised/ongoing as appropriate.  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, CHF, diabetes mellitus, HTN, neuropathy, obesity  Examination-Activity Limitations Stand;Sit;Transfers;Stairs;Squat;Locomotion Level  Examination-Participation Restrictions Church;Community Activity  Pt will benefit from skilled therapeutic intervention in order to improve on the following deficits Abnormal gait;Cardiopulmonary status limiting activity;Decreased activity tolerance;Decreased balance;Decreased coordination;Decreased endurance;Decreased mobility;Difficulty walking;Decreased strength;Impaired sensation;Postural dysfunction;Obesity  Stability/Clinical Decision Making Evolving/Moderate complexity  Rehab Potential Good  PT Frequency 2x / week  PT Duration 4 weeks  PT Treatment/Interventions ADLs/Self Care Home Management;DME Instruction;Gait training;Stair training;Functional mobility training;Therapeutic activities;Balance training;Therapeutic exercise;Neuromuscular re-education;Patient/family education;Energy conservation  PT Next Visit Plan seated and standing functional strengthening,  standing balance, perform 3MWT.  PT Home Exercise Plan [facility]  Consulted and Agree with Plan of Care Patient       Patient will benefit from skilled therapeutic intervention in order to improve the following deficits and impairments:     Visit Diagnosis: Muscle weakness (generalized)  Unsteadiness on feet  Other abnormalities of gait and  mobility  Difficulty in walking, not elsewhere classified     Problem List Patient Active Problem List   Diagnosis Date Noted  . Mixed stress and urge urinary incontinence 07/04/2019  . Nerve pain 05/16/2019  . Myofascial pain 05/16/2019  . Subluxation of acromioclavicular joint, right, sequela 03/23/2019  . Sickle cell trait ([place])   . Labile blood glucose   . Diabetic peripheral neuropathy ([place])   . Hypokalemia   . Pain in joint of left shoulder   . Debility   . 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 COVID-19   . History of anemia   . Super obese   . Poorly controlled type 2 diabetes mellitus with peripheral neuropathy ([place])   . Acute on chronic diastolic (congestive) heart  failure ([place])   . Hypernatremia   . Shock circulatory ([place])   . Septic shock ([place])   . Hypotension 01/13/2019  . Acute respiratory failure with hypoxemia ([place]) 01/13/2019  . Acute respiratory distress syndrome (ARDS) due to COVID-19 virus ([place]) 01/12/2019  . Grade I diastolic dysfunction 75/17/0017  . 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 Dyspnea on exertion 05/19/2016  . Other fatigue 05/19/2016  . Absolute anemia 04/15/2016  . Left leg weakness 10/18/2014  . Left leg paresthesias 10/18/2014  . Acute left-sided weakness 10/18/2014  . Encounter for central line placement 03/08/2014  . Taking drug for chronic disease 03/08/2014  . H/O insertion of insulin pump 02/18/2014  . Long term current use of insulin ([place]) 02/18/2014  . Insulin pump in place 02/18/2014  . 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 Abnormal ballistocardiogram 05/18/2013  . Abnormal nuclear stress test 05/18/2013  . 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 NICM (nonischemic cardiomyopathy) ([place]) 05/01/2013  . Insulin dependent type 2 diabetes mellitus ([place]) 04/10/2013  . Sex counseling 04/10/2013  . Edema leg 04/10/2013  . Upper respiratory infection, viral 04/10/2013  . Pedal edema 03/29/2013  . Pneumonia 12/12/2012  .  Essential hypertension 12/12/2012  . HLD (hyperlipidemia) 12/12/2012  . Tonsillar mass 12/01/2012  . 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 Leukocytosis   . Anemia   . High cholesterol   . Hypertension   . Blurred vision 09/11/2011  . Foot pain 09/11/2011  . Breast pain 09/11/2011  . Morbid obesity ([place]) 09/11/2011  . Fungal infection of nail 09/11/2011  . Avitaminosis D 09/11/2011    Arliss Journey, PT, DPT  10/19/2019, 4:23 PM  [facility] [address] [place], [place], [address] Phone: [phone]   Fax:  [phone]  Name: MINHA FULCO MRN: [id number] Date of Birth: [birth year]

## 2019-10-22 ENCOUNTER — Ambulatory Visit: Payer: Medicaid Other | Admitting: Occupational Therapy

## 2019-10-22 ENCOUNTER — Other Ambulatory Visit: Payer: Self-pay

## 2019-10-22 ENCOUNTER — Encounter: Payer: Self-pay | Admitting: Occupational Therapy

## 2019-10-22 VITALS — BP 117/78

## 2019-10-22 DIAGNOSIS — M79601 Pain in right arm: Secondary | ICD-10-CM

## 2019-10-22 DIAGNOSIS — M6281 Muscle weakness (generalized): Secondary | ICD-10-CM | POA: Diagnosis not present

## 2019-10-22 DIAGNOSIS — M25612 Stiffness of left shoulder, not elsewhere classified: Secondary | ICD-10-CM

## 2019-10-22 DIAGNOSIS — M79602 Pain in left arm: Secondary | ICD-10-CM

## 2019-10-22 DIAGNOSIS — R2681 Unsteadiness on feet: Secondary | ICD-10-CM

## 2019-10-22 DIAGNOSIS — R41840 Attention and concentration deficit: Secondary | ICD-10-CM

## 2019-10-22 NOTE — Patient Instructions (Addendum)
   Seated -  holding wand. Raise arms over head. Hold 5sec. Repeat 10 times per set.  Do 2-3 sessions per day.   ROM: Abduction - Wand   Holding wand with left hand palm up, push wand directly out to side, leading with other hand palm down, until stretch is felt. Hold 5 seconds. Repeat 10 times per set. Do 2-3 sessions per day. (Lying down)     Press-Up With Wand   Press wand up until elbows are straight, then reach wand over head to a pain free range. Hold 5 seconds. Repeat 10 times. Do 2-3 sessions per day.     Active Assistive Shoulder External Rotation    SIT with stick at waist level, LEFT palm up, other palm down. Step to side, push forearm out from body with hand palm down, and keep elbows bent. Hold. Side step and return to start position. Perform 10 reps. 2X DAY    Energy Conservation Techniques  1. Sit for as many activities as possible. 2. Use slow, smooth movements.  Rushing increases discomfort. 3. Determine the necessity of performing the task.  Simplify those tasks that are necessary.  (Get clothes out of the dryer when they are warm instead of ironing, let dishes air dry, etc.) 4. Take frequent rests both during and between activities.  Avoid repetitive tasks. 5. Pre-plan your activities; try a daily and/or weekly schedule.  Spread out the activities that are most fatiguing (break up cleaning tasks over multiple days). 6. Remember to plan a balance of work, rest and recreation. 7. Consider the best time for each activity.  Do the most exertive task when you have the most energy. 8. Don't carry items if you can push them.  Slide, don't lift. Push, don't pull. 9. Utilize two hands when appropriate. 10. Maintain good posture and use proper body mechanics.  Avoid remaining in one position for too long.  When lifting, bend at the knees, not at the waist.  Exhale when bending down, inhale when straightening up.  Carry objects as close to your body and as  near to the center of the pelvis.  11. Avoid wasted body movements (position yourself for the task so that you avoid bending, twisting, etc.                when possible). 12. Select the best working environment.  Consider lighting, ventilation, clothing, and equipment. 54. Organize your storage areas, making the items you use daily convenient.  Store heaviest items at waist            height.  Store frequently used items between shoulders and knee height.  Consider leaving frequently used       items on countertops.  (You can organize in storage baskets based on time used/purpose). 14. Feelings and emotions can be real causes of fatigue.  Try to avoid unnecessary worry, irritation, or                    frustration.  Avoid stress, it can also be a source of fatigue. 15. Get help from other people for difficult tasks. 16. Explore equipment or items that may be able to do the job for you with greater ease.  (Electric can        openers, blenders, lightweight items for cleaning, etc.)

## 2019-10-22 NOTE — Therapy (Signed)
[facility] [address] [place], [place], [address] Phone: [phone]   Fax:  [phone]  Occupational Therapy Treatment  Patient Details  Name: Kaitlin [name] MRN: [id number] Date of Birth: May 30, 1965 No data recorded  Encounter Date: 10/22/2019   OT End of Session - 10/22/19 1749    Visit Number 2    Number of Visits 13    Date for OT Re-Evaluation 11/16/19    Authorization Type [employer] - awaiting authorization    OT Start Time 1703    OT Stop Time 0086    OT Time Calculation (min) 45 min    Activity Tolerance Patient tolerated treatment well    Behavior During Therapy [facility] for tasks assessed/performed           Past Medical History:  Diagnosis Date  . Anemia   . Bronchitis   . Cataracts, bilateral 01/12/2018  . CHF (congestive heart failure) ([place])   . Diabetes mellitus   . High cholesterol   . Hypertension    takes medicine to protect kidneys, does not have HTN  . Leukocytosis   . Morbid obesity ([place]) 09/11/2011   BMI 57  . Neuropathy 01/12/2018  . NICM (nonischemic cardiomyopathy) ([place]) 05/01/2013   Overview:  Last Assessment & Plan:  euvolemic on physical exam.   . Obesity   . Sickle cell trait ([place])   . Thymoma     Past Surgical History:  Procedure Laterality Date  . COLONOSCOPY N/A 06/21/2017   Procedure: COLONOSCOPY;  Surgeon: Ronnette Juniper, MD;  Location: [facility];  Service: Gastroenterology;  Laterality: N/A;  . LEFT HEART CATHETERIZATION WITH CORONARY ANGIOGRAM N/A 05/25/2013   Procedure: LEFT HEART CATHETERIZATION WITH CORONARY ANGIOGRAM;  Surgeon: [place] Sine, MD;  Location: [facility] CATH LAB;  Service: Cardiovascular;  Laterality: N/A;  . LYMPH NODE BIOPSY Right 11/30/2012   Procedure: RIGHT TONSIL BIOPSY WITH FRESH FROZEN ANALYSIS;  Surgeon: Jodi Marble, MD;  Location: [facility];  Service: ENT;  Laterality: Right;  . MEDIASTERNOTOMY N/A 01/12/2013   Procedure: MEDIAN STERNOTOMY;   Surgeon: Gaye Pollack, MD;  Location: [facility];  Service: Thoracic;  Laterality: N/A;  . MEDIASTINOTOMY CHAMBERLAIN MCNEIL Right 11/06/2012   Procedure: MEDIASTINOTOMY CHAMBERLAIN MCNEILPROCEDURE;  Surgeon: Gaye Pollack, MD;  Location: [facility];  Service: Thoracic;  Laterality: Right;  . POLYPECTOMY  06/21/2017   Procedure: POLYPECTOMY;  Surgeon: Ronnette Juniper, MD;  Location: [facility];  Service: Gastroenterology;;  . RESECTION OF A THYMOMA N/A 01/12/2013   Procedure: RESECTION OF A THYMOMA;  Surgeon: Gaye Pollack, MD;  Location: [facility];  Service: Thoracic;  Laterality: N/A;  . TONSILLECTOMY    . TUBAL LIGATION      Vitals:   10/22/19 1734  BP: 117/78     Subjective Assessment - 10/22/19 1706    Subjective  Pt reports pain in back, knee and both arms. Comes and goes.    Pertinent History ICU myopathy s/p covid w/ hypoxemic respiratory failure d/t ARDS in [name] 2021. PMH: CHF, T2DM, sickle cell trait    Limitations monitor vitals prn, BP taken at Lt forearm    Patient Stated Goals increase reaching and endurance in my arms    Currently in Pain? Yes    Pain Score 3     Pain Location Arm    Pain Orientation Left;Right    Pain Descriptors / Indicators Aching    Pain Type Chronic pain    Pain Onset More than  a month ago    Pain Frequency Intermittent    Pain Relieving Factors tylenol helps             Treatment:   BUE AROM cane exercises seated. Pt uncomfortable with supine this day but recommended doing them in supine at home.  Tossing ball with therapist while seated edge of mat. Issued energy conservation strategies and reviewed Resistance Clothespins 1-8# with BUE for activity tolerance, shoulder ROM and strengthening                  OT Education - 10/22/19 1732    Education Details issued BUE AROM cane exercises and energy conservation strategies - see pt instructions    Person(s) Educated Patient    Methods Explanation;Demonstration    Comprehension  Verbalized understanding;Returned demonstration            OT Short Term Goals - 10/03/19 1421      OT SHORT TERM GOAL #1   Title Independent with BUE A/ROM HEP    Baseline Dependent    Time 3    Period Weeks    Status New      OT SHORT TERM GOAL #2   Title Pt to verbalize understanding with energy conservation techniques and memory strategies    Baseline dependent    Time 3    Period Weeks    Status New      OT SHORT TERM GOAL #3   Title Pt to verbalize understanding with task modifications and potential A/E needs to increase ease with ADLS    Baseline Dependent    Time 3    Period Weeks    Status New             OT Long Term Goals - 10/03/19 1423      OT LONG TERM GOAL #1   Title Pt will attend to Lt side with environmental scanning at 90% accuracy    Baseline has not yet attempted but reports occaisonally missing things on Lt side    Time 6    Period Weeks    Status New      OT LONG TERM GOAL #2   Title Pt to stand for 8 minutes for light IADL tasks w/ no major changes to vitals    Baseline 2-3 minutes    Time 6    Period Weeks    Status New      OT LONG TERM GOAL #3   Title Independent with strengtheing HEP for BUE's    Baseline dependent    Time 6    Period Weeks    Status New      OT LONG TERM GOAL #4   Title Pt to perform repetitive and sustained reaching x 2 minutes each UE for putting away groceries and dishes    Baseline can only do a few reps before fatiguing    Time 6    Period Weeks    Status New                 Plan - 10/22/19 1730    Clinical Impression Statement Pt continuing to work towards goals and increase independence with less fatigue.    OT Occupational Profile and History Detailed Assessment- Review of Records and additional review of physical, cognitive, psychosocial history related to current functional performance    Occupational performance deficits (Please refer to evaluation for details):  ADL's;IADL's;Leisure;Social Participation    Body Structure / Function / Physical Skills ADL;IADL;ROM;Endurance;Cardiopulmonary status  limiting activity;Strength;UE functional use;Pain;Mobility;Decreased knowledge of use of DME;Vision    Cognitive Skills Attention;Memory    Rehab Potential Good    Clinical Decision Making Limited treatment options, no task modification necessary    Comorbidities Affecting Occupational Performance: May have comorbidities impacting occupational performance    Modification or Assistance to Complete Evaluation  No modification of tasks or assist necessary to complete eval    OT Frequency 2x / week    OT Duration 6 weeks   plus eval   OT Treatment/Interventions Self-care/ADL training;Therapeutic exercise;Functional Mobility Training;Neuromuscular education;Therapeutic activities;Coping strategies training;DME and/or AE instruction;Cognitive remediation/compensation;Visual/perceptual remediation/compensation;Passive range of motion;Patient/family education    Plan memory strategies, A/E recommendations - LH sponge, sock aide    Consulted and Agree with Plan of Care Patient           Patient will benefit from skilled therapeutic intervention in order to improve the following deficits and impairments:   Body Structure / Function / Physical Skills: ADL, IADL, ROM, Endurance, Cardiopulmonary status limiting activity, Strength, UE functional use, Pain, Mobility, Decreased knowledge of use of DME, Vision Cognitive Skills: Attention, Memory     Visit Diagnosis: Muscle weakness (generalized)  Unsteadiness on feet  Pain in left arm  Pain in right arm  Attention and concentration deficit  Stiffness of left shoulder, not elsewhere classified    Problem List Patient Active Problem List   Diagnosis Date Noted  . Mixed stress and urge urinary incontinence 07/04/2019  . Nerve pain 05/16/2019  . Myofascial pain 05/16/2019  . Subluxation of acromioclavicular  joint, right, sequela 03/23/2019  . Sickle cell trait ([place])   . Labile blood glucose   . Diabetic peripheral neuropathy ([place])   . Hypokalemia   . Pain in joint of left shoulder   . Debility   . 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 COVID-19   . History of anemia   . Super obese   . Poorly controlled type 2 diabetes mellitus with peripheral neuropathy ([place])   . Acute on chronic diastolic (congestive) heart failure ([place])   . Hypernatremia   . Shock circulatory ([place])   . Septic shock ([place])   . Hypotension 01/13/2019  . Acute respiratory failure with hypoxemia ([place]) 01/13/2019  . Acute respiratory distress syndrome (ARDS) due to COVID-19 virus ([place]) 01/12/2019  . Grade I diastolic dysfunction 37/85/8850  . 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 Dyspnea on exertion 05/19/2016  . Other fatigue 05/19/2016  . Absolute anemia 04/15/2016  . Left leg weakness 10/18/2014  . Left leg paresthesias 10/18/2014  . Acute left-sided weakness 10/18/2014  . Encounter for central line placement 03/08/2014  . Taking drug for chronic disease 03/08/2014  . H/O insertion of insulin pump 02/18/2014  . Long term current use of insulin ([place]) 02/18/2014  . Insulin pump in place 02/18/2014  . 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 Abnormal ballistocardiogram 05/18/2013  . Abnormal nuclear stress test 05/18/2013  . 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 NICM (nonischemic cardiomyopathy) ([place]) 05/01/2013  . Insulin dependent type 2 diabetes mellitus ([place]) 04/10/2013  . Sex counseling 04/10/2013  . Edema leg 04/10/2013  . Upper respiratory infection, viral 04/10/2013  . Pedal edema 03/29/2013  . Pneumonia 12/12/2012  . Essential hypertension 12/12/2012  . HLD (hyperlipidemia) 12/12/2012  . Tonsillar mass 12/01/2012  . 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 Leukocytosis   . Anemia   . High cholesterol   . Hypertension   . Blurred vision 09/11/2011  .  Foot pain 09/11/2011    . Breast pain 09/11/2011  . Morbid obesity ([place]) 09/11/2011  . Fungal infection of nail 09/11/2011  . Avitaminosis D 09/11/2011    Zachery Conch MOT, OTR/L  10/22/2019, 5:49 PM  [facility] [address] [place], [place], [address] Phone: [phone]   Fax:  [phone]  Name: Kaitlin [name] MRN: [id number] Date of Birth: [birth year]

## 2019-10-22 NOTE — Addendum Note (Signed)
Addended by: Arliss Journey on: 10/22/2019 09:03 PM   Modules accepted: Orders

## 2019-10-25 ENCOUNTER — Ambulatory Visit: Payer: Medicaid Other | Admitting: Occupational Therapy

## 2019-10-30 ENCOUNTER — Ambulatory Visit: Payer: Medicaid Other | Admitting: Occupational Therapy

## 2019-10-30 ENCOUNTER — Other Ambulatory Visit: Payer: Self-pay

## 2019-10-30 DIAGNOSIS — M6281 Muscle weakness (generalized): Secondary | ICD-10-CM

## 2019-10-30 DIAGNOSIS — M79601 Pain in right arm: Secondary | ICD-10-CM

## 2019-10-30 DIAGNOSIS — M79602 Pain in left arm: Secondary | ICD-10-CM

## 2019-10-30 DIAGNOSIS — R2681 Unsteadiness on feet: Secondary | ICD-10-CM

## 2019-10-30 NOTE — Patient Instructions (Signed)

## 2019-10-30 NOTE — Therapy (Signed)
[facility] [address] [place], [place], [address] Phone: [phone]   Fax:  [phone]  Occupational Therapy Treatment  Patient Details  Name: Kaitlin [name] MRN: [id number] Date of Birth: 1965/12/17 No data recorded  Encounter Date: 10/30/2019   OT End of Session - 10/30/19 1217    Visit Number 3    Number of Visits 13    Date for OT Re-Evaluation 11/16/19    Authorization Type [employer] - awaiting authorization    OT Start Time 1105    OT Stop Time 1148    OT Time Calculation (min) 43 min    Activity Tolerance Patient tolerated treatment well    Behavior During Therapy [facility] for tasks assessed/performed           Past Medical History:  Diagnosis Date  . Anemia   . Bronchitis   . Cataracts, bilateral 01/12/2018  . CHF (congestive heart failure) ([place])   . Diabetes mellitus   . High cholesterol   . Hypertension    takes medicine to protect kidneys, does not have HTN  . Leukocytosis   . Morbid obesity ([place]) 09/11/2011   BMI 57  . Neuropathy 01/12/2018  . NICM (nonischemic cardiomyopathy) ([place]) 05/01/2013   Overview:  Last Assessment & Plan:  euvolemic on physical exam.   . Obesity   . Sickle cell trait ([place])   . Thymoma     Past Surgical History:  Procedure Laterality Date  . COLONOSCOPY N/A 06/21/2017   Procedure: COLONOSCOPY;  Surgeon: Ronnette Juniper, MD;  Location: [facility];  Service: Gastroenterology;  Laterality: N/A;  . LEFT HEART CATHETERIZATION WITH CORONARY ANGIOGRAM N/A 05/25/2013   Procedure: LEFT HEART CATHETERIZATION WITH CORONARY ANGIOGRAM;  Surgeon: [place] Sine, MD;  Location: [facility] CATH LAB;  Service: Cardiovascular;  Laterality: N/A;  . LYMPH NODE BIOPSY Right 11/30/2012   Procedure: RIGHT TONSIL BIOPSY WITH FRESH FROZEN ANALYSIS;  Surgeon: Jodi Marble, MD;  Location: [facility];  Service: ENT;  Laterality: Right;  . MEDIASTERNOTOMY N/A 01/12/2013   Procedure: MEDIAN STERNOTOMY;   Surgeon: Gaye Pollack, MD;  Location: [facility];  Service: Thoracic;  Laterality: N/A;  . MEDIASTINOTOMY CHAMBERLAIN MCNEIL Right 11/06/2012   Procedure: MEDIASTINOTOMY CHAMBERLAIN MCNEILPROCEDURE;  Surgeon: Gaye Pollack, MD;  Location: [facility];  Service: Thoracic;  Laterality: Right;  . POLYPECTOMY  06/21/2017   Procedure: POLYPECTOMY;  Surgeon: Ronnette Juniper, MD;  Location: [facility];  Service: Gastroenterology;;  . RESECTION OF A THYMOMA N/A 01/12/2013   Procedure: RESECTION OF A THYMOMA;  Surgeon: Gaye Pollack, MD;  Location: [facility];  Service: Thoracic;  Laterality: N/A;  . TONSILLECTOMY    . TUBAL LIGATION      There were no vitals filed for this visit.   Subjective Assessment - 10/30/19 1110    Subjective  Pt reports pain in back, knee and both arms. Comes and goes.    Pertinent History ICU myopathy s/p covid w/ hypoxemic respiratory failure d/t ARDS in [name] 2021. PMH: CHF, T2DM, sickle cell trait    Limitations monitor vitals prn, BP taken at Lt forearm    Patient Stated Goals increase reaching and endurance in my arms    Currently in Pain? Yes    Pain Location Generalized   chest, back, Rt knee, both arms   Pain Descriptors / Indicators Aching;Heaviness    Pain Type Chronic pain    Pain Onset More than a month ago    Pain Frequency  Intermittent    Aggravating Factors  rainy/cold weather    Pain Relieving Factors tylenol heps           Reviewed cane HEP and clarified ex's and proper technique. Pt performed each x 10 reps w/ min to mod cueing.  Discussed memory strategies - see pt instructions for details. Unable to print today - will issue handout next visit.  Discussed potential A/E needs - pt reports she already has a sock aide but doesn't like it, and now is able to don sock from low bed w/ stool. Also discussed LH sponge however pt does not want sponge material. Pt reports she is cooking some but has to sit in w/c a lot when cooking.                       OT Education - 10/30/19 1123    Education Details memory strategies, A/E recommendations    Person(s) Educated Patient    Methods Explanation;Handout    Comprehension Verbalized understanding            OT Short Term Goals - 10/30/19 1218      OT SHORT TERM GOAL #1   Title Independent with BUE A/ROM HEP    Baseline Dependent    Time 3    Period Weeks    Status Achieved      OT SHORT TERM GOAL #2   Title Pt to verbalize understanding with energy conservation techniques and memory strategies    Baseline dependent    Time 3    Period Weeks    Status On-going      OT SHORT TERM GOAL #3   Title Pt to verbalize understanding with task modifications and potential A/E needs to increase ease with ADLS    Baseline Dependent    Time 3    Period Weeks    Status On-going             OT Long Term Goals - 10/03/19 1423      OT LONG TERM GOAL #1   Title Pt will attend to Lt side with environmental scanning at 90% accuracy    Baseline has not yet attempted but reports occaisonally missing things on Lt side    Time 6    Period Weeks    Status New      OT LONG TERM GOAL #2   Title Pt to stand for 8 minutes for light IADL tasks w/ no major changes to vitals    Baseline 2-3 minutes    Time 6    Period Weeks    Status New      OT LONG TERM GOAL #3   Title Independent with strengtheing HEP for BUE's    Baseline dependent    Time 6    Period Weeks    Status New      OT LONG TERM GOAL #4   Title Pt to perform repetitive and sustained reaching x 2 minutes each UE for putting away groceries and dishes    Baseline can only do a few reps before fatiguing    Time 6    Period Weeks    Status New                 Plan - 10/30/19 1219    Clinical Impression Statement Pt progressing towards STG's. Pt reports now getting shoes and socks on with difficulty. Endurance remains limited, but overall gradual improvement.    OT Occupational  Profile and History Detailed Assessment- Review of Records and additional review of physical, cognitive, psychosocial history related to current functional performance    Occupational performance deficits (Please refer to evaluation for details): ADL's;IADL's;Leisure;Social Participation    Body Structure / Function / Physical Skills ADL;IADL;ROM;Endurance;Cardiopulmonary status limiting activity;Strength;UE functional use;Pain;Mobility;Decreased knowledge of use of DME;Vision    Cognitive Skills Attention;Memory    Rehab Potential Good    Clinical Decision Making Limited treatment options, no task modification necessary    Comorbidities Affecting Occupational Performance: May have comorbidities impacting occupational performance    Modification or Assistance to Complete Evaluation  No modification of tasks or assist necessary to complete eval    OT Frequency 2x / week    OT Duration 6 weeks   plus eval   OT Treatment/Interventions Self-care/ADL training;Therapeutic exercise;Functional Mobility Training;Neuromuscular education;Therapeutic activities;Coping strategies training;DME and/or AE instruction;Cognitive remediation/compensation;Visual/perceptual remediation/compensation;Passive range of motion;Patient/family education    Plan progress towards LTG's, review memory strategies prn    Consulted and Agree with Plan of Care Patient           Patient will benefit from skilled therapeutic intervention in order to improve the following deficits and impairments:   Body Structure / Function / Physical Skills: ADL, IADL, ROM, Endurance, Cardiopulmonary status limiting activity, Strength, UE functional use, Pain, Mobility, Decreased knowledge of use of DME, Vision Cognitive Skills: Attention, Memory     Visit Diagnosis: Muscle weakness (generalized)  Unsteadiness on feet  Pain in left arm  Pain in right arm    Problem List Patient Active Problem List   Diagnosis Date Noted  . Mixed  stress and urge urinary incontinence 07/04/2019  . Nerve pain 05/16/2019  . Myofascial pain 05/16/2019  . Subluxation of acromioclavicular joint, right, sequela 03/23/2019  . Sickle cell trait ([place])   . Labile blood glucose   . Diabetic peripheral neuropathy ([place])   . Hypokalemia   . Pain in joint of left shoulder   . Debility   . 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 COVID-19   . History of anemia   . Super obese   . Poorly controlled type 2 diabetes mellitus with peripheral neuropathy ([place])   . Acute on chronic diastolic (congestive) heart failure ([place])   . Hypernatremia   . Shock circulatory ([place])   . Septic shock ([place])   . Hypotension 01/13/2019  . Acute respiratory failure with hypoxemia ([place]) 01/13/2019  . Acute respiratory distress syndrome (ARDS) due to COVID-19 virus ([place]) 01/12/2019  . Grade I diastolic dysfunction 93/57/0177  . 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 Dyspnea on exertion 05/19/2016  . Other fatigue 05/19/2016  . Absolute anemia 04/15/2016  . Left leg weakness 10/18/2014  . Left leg paresthesias 10/18/2014  . Acute left-sided weakness 10/18/2014  . Encounter for central line placement 03/08/2014  . Taking drug for chronic disease 03/08/2014  . H/O insertion of insulin pump 02/18/2014  . Long term current use of insulin ([place]) 02/18/2014  . Insulin pump in place 02/18/2014  . 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 Abnormal ballistocardiogram 05/18/2013  . Abnormal nuclear stress test 05/18/2013  . 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 NICM (nonischemic cardiomyopathy) ([place]) 05/01/2013  . Insulin dependent type 2 diabetes mellitus ([place]) 04/10/2013  . Sex counseling 04/10/2013  . Edema leg 04/10/2013  . Upper respiratory infection, viral 04/10/2013  . Pedal edema 03/29/2013  . Pneumonia 12/12/2012  . Essential hypertension 12/12/2012  . HLD (hyperlipidemia) 12/12/2012  . Tonsillar mass 12/01/2012  . Thymoma  11/29/2012  . 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 Leukocytosis   . Anemia   . High cholesterol   . Hypertension   . Blurred vision 09/11/2011  . Foot pain 09/11/2011  . Breast pain 09/11/2011  . Morbid obesity ([place]) 09/11/2011  . Fungal infection of nail 09/11/2011  . Avitaminosis D 09/11/2011    Carey Bullocks, OTR/L 10/30/2019, 12:21 PM  [facility] [address] [place], [place], [address] Phone: [phone]   Fax:  [phone]  Name: SHAYLYNNE LUNT MRN: [id number] Date of Birth: [birth year]

## 2019-11-01 ENCOUNTER — Ambulatory Visit: Payer: Medicaid Other | Admitting: Physical Therapy

## 2019-11-01 ENCOUNTER — Ambulatory Visit: Payer: Medicaid Other | Admitting: Occupational Therapy

## 2019-11-01 ENCOUNTER — Other Ambulatory Visit: Payer: Self-pay

## 2019-11-01 DIAGNOSIS — R2681 Unsteadiness on feet: Secondary | ICD-10-CM

## 2019-11-01 DIAGNOSIS — M6281 Muscle weakness (generalized): Secondary | ICD-10-CM

## 2019-11-01 DIAGNOSIS — M25612 Stiffness of left shoulder, not elsewhere classified: Secondary | ICD-10-CM

## 2019-11-01 NOTE — Therapy (Signed)
Kaitlin [name] [address] [place], [place], [address] Phone: [phone]   Fax:  [phone]  Occupational Therapy Treatment  Patient Details  Name: Kaitlin [name] MRN: [id number] Date of Birth: [birth year] No data recorded  Encounter Date: 11/01/2019   OT End of Session - 11/01/19 1121    Visit Number 4    Number of Visits 13    Date for OT Re-Evaluation 11/16/19    Authorization Type [employer] -    Authorization Time Period approved 12 visits 10/11 - 11/29/19    Authorization - Visit Number 3    Authorization - Number of Visits 12    OT Start Time 1115   pt arrived late   OT Stop Time 1145    OT Time Calculation (min) 30 min    Activity Tolerance Patient tolerated treatment well    Behavior During Therapy [facility] for tasks assessed/performed           Past Medical History:  Diagnosis Date  . Anemia   . Bronchitis   . Cataracts, bilateral 01/12/2018  . CHF (congestive heart failure) ([place])   . Diabetes mellitus   . High cholesterol   . Hypertension    takes medicine to protect kidneys, does not have HTN  . Leukocytosis   . Morbid obesity ([place]) 09/11/2011   BMI 57  . Neuropathy 01/12/2018  . NICM (nonischemic cardiomyopathy) ([place]) 05/01/2013   Overview:  Last Assessment & Plan:  euvolemic on physical exam.   . Obesity   . Sickle cell trait ([place])   . Thymoma     Past Surgical History:  Procedure Laterality Date  . COLONOSCOPY N/A 06/21/2017   Procedure: COLONOSCOPY;  Surgeon: Ronnette Juniper, MD;  Location: [facility];  Service: Gastroenterology;  Laterality: N/A;  . LEFT HEART CATHETERIZATION WITH CORONARY ANGIOGRAM N/A 05/25/2013   Procedure: LEFT HEART CATHETERIZATION WITH CORONARY ANGIOGRAM;  Surgeon: Troy Sine, MD;  Location[facility] LAB;  Service: Cardiovascular;  Laterality: N/A;  . LYMPH NODE BIOPSY Right 11/30/2012   Procedure: RIGHT TONSIL BIOPSY WITH FRESH FROZEN ANALYSIS;  Surgeon: Jodi Marble, MD;  Location: [place];  Service: ENT;  Laterality: Right;  . MEDIASTERNOTOMY N/A 01/12/2013   Procedure: MEDIAN STERNOTOMY;  Surgeon: Gaye Pollack, MD;  Location: [place];  Service: Thoracic;  Laterality: N/A;  . MEDIASTINOTOMY CHAMBERLAIN MCNEIL Right 11/06/2012   Procedure: MEDIASTINOTOMY CHAMBERLAIN MCNEILPROCEDURE;  Surgeon: Gaye Pollack, MD;  Location: [place];  Service: Thoracic;  Laterality: Right;  . POLYPECTOMY  06/21/2017   Procedure: POLYPECTOMY;  Surgeon: Ronnette Juniper, MD;  Location: [facility];  Service: Gastroenterology;;  . RESECTION OF A THYMOMA N/A 01/12/2013   Procedure: RESECTION OF A THYMOMA;  Surgeon: Gaye Pollack, MD;  Location: [place];  Service: Thoracic;  Laterality: N/A;  . TONSILLECTOMY    . TUBAL LIGATION      There were no vitals filed for this visit.   Subjective Assessment - 11/01/19 1117    Subjective  Pt reports pain in back, knee and both arms. Comes and goes.    Pertinent History ICU myopathy s/p covid w/ hypoxemic respiratory failure d/t ARDS in [name] 2021. PMH: CHF, T2DM, sickle cell trait    Limitations monitor vitals prn, BP taken at Lt forearm    Patient Stated Goals increase reaching and endurance in my arms    Currently in Pain? Yes    Pain Score 3    up to 5/10  Pain Location Generalized    Pain Descriptors / Indicators Aching;Heaviness    Pain Type Chronic pain    Pain Onset More than a month ago    Aggravating Factors  rainy/cold weather    Pain Relieving Factors tylenol helps          Pt had long line to check in and therefore late for appointment.  Sit to stand x 5 reps, 2 sets.  Standing x 5 minutes to copy peg design on vertical surface for UE endurance and standing tolerance (RUE doing 1/2 design, LUE doing 1/2 design) - pt could reach higher with RUE. After 5 minutes standing: HR = 107, O2 sats = 94%, but quickly recovered O2 sats to 98% when seated. Pt repeated short amounts of standing to finish design and then remove pegs.   UBE x 5 min. Level 1 for UE strength/endurance maintaining 98%                         OT Short Term Goals - 10/30/19 1218      OT SHORT TERM GOAL #1   Title Independent with BUE A/ROM HEP    Baseline Dependent    Time 3    Period Weeks    Status Achieved      OT SHORT TERM GOAL #2   Title Pt to verbalize understanding with energy conservation techniques and memory strategies    Baseline dependent    Time 3    Period Weeks    Status On-going      OT SHORT TERM GOAL #3   Title Pt to verbalize understanding with task modifications and potential A/E needs to increase ease with ADLS    Baseline Dependent    Time 3    Period Weeks    Status On-going             OT Long Term Goals - 10/03/19 1423      OT LONG TERM GOAL #1   Title Pt will attend to Lt side with environmental scanning at 90% accuracy    Baseline has not yet attempted but reports occaisonally missing things on Lt side    Time 6    Period Weeks    Status New      OT LONG TERM GOAL #2   Title Pt to stand for 8 minutes for light IADL tasks w/ no major changes to vitals    Baseline 2-3 minutes    Time 6    Period Weeks    Status New      OT LONG TERM GOAL #3   Title Independent with strengtheing HEP for BUE's    Baseline dependent    Time 6    Period Weeks    Status New      OT LONG TERM GOAL #4   Title Pt to perform repetitive and sustained reaching x 2 minutes each UE for putting away groceries and dishes    Baseline can only do a few reps before fatiguing    Time 6    Period Weeks    Status New                 Plan - 11/01/19 1140    Clinical Impression Statement Pt quickly fatigues with standing tasks and O2 sats drop but quickly recovers when sitting. Pt does best with short duration standing    OT Occupational Profile and History Detailed Assessment- Review of Records and additional  review of physical, cognitive, psychosocial history related to current functional  performance    Occupational performance deficits (Please refer to evaluation for details): ADL's;IADL's;Leisure;Social Participation    Body Structure / Function / Physical Skills ADL;IADL;ROM;Endurance;Cardiopulmonary status limiting activity;Strength;UE functional use;Pain;Mobility;Decreased knowledge of use of DME;Vision    Cognitive Skills Attention;Memory    Rehab Potential Good    Clinical Decision Making Limited treatment options, no task modification necessary    Comorbidities Affecting Occupational Performance: May have comorbidities impacting occupational performance    Modification or Assistance to Complete Evaluation  No modification of tasks or assist necessary to complete eval    OT Frequency 2x / week    OT Duration 6 weeks   plus eval   OT Treatment/Interventions Self-care/ADL training;Therapeutic exercise;Functional Mobility Training;Neuromuscular education;Therapeutic activities;Coping strategies training;DME and/or AE instruction;Cognitive remediation/compensation;Visual/perceptual remediation/compensation;Passive range of motion;Patient/family education    Plan continue to work endurance, standing tolerance, reaching tasks (lower for Lt shoulder)    Consulted and Agree with Plan of Care Patient           Patient will benefit from skilled therapeutic intervention in order to improve the following deficits and impairments:   Body Structure / Function / Physical Skills: ADL, IADL, ROM, Endurance, Cardiopulmonary status limiting activity, Strength, UE functional use, Pain, Mobility, Decreased knowledge of use of DME, Vision Cognitive Skills: Attention, Memory     Visit Diagnosis: Unsteadiness on feet  Muscle weakness (generalized)  Stiffness of left shoulder, not elsewhere classified    Problem List Patient Active Problem List   Diagnosis Date Noted  . Mixed stress and urge urinary incontinence 07/04/2019  . Nerve pain 05/16/2019  . Myofascial pain 05/16/2019  .  Subluxation of acromioclavicular joint, right, sequela 03/23/2019  . Sickle cell trait ([place])   . Labile blood glucose   . Diabetic peripheral neuropathy ([place])   . Hypokalemia   . Pain in joint of left shoulder   . Debility   . 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 COVID-19   . History of anemia   . Super obese   . Poorly controlled type 2 diabetes mellitus with peripheral neuropathy ([place])   . Acute on chronic diastolic (congestive) heart failure ([place])   . Hypernatremia   . Shock circulatory ([place])   . Septic shock ([place])   . Hypotension 01/13/2019  . Acute respiratory failure with hypoxemia ([place]) 01/13/2019  . Acute respiratory distress syndrome (ARDS) due to COVID-19 virus ([place]) 01/12/2019  . Grade I diastolic dysfunction 80/16/5537  . 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 Dyspnea on exertion 05/19/2016  . Other fatigue 05/19/2016  . Absolute anemia 04/15/2016  . Left leg weakness 10/18/2014  . Left leg paresthesias 10/18/2014  . Acute left-sided weakness 10/18/2014  . Encounter for central line placement 03/08/2014  . Taking drug for chronic disease 03/08/2014  . H/O insertion of insulin pump 02/18/2014  . Long term current use of insulin ([place]) 02/18/2014  . Insulin pump in place 02/18/2014  . 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 Abnormal ballistocardiogram 05/18/2013  . Abnormal nuclear stress test 05/18/2013  . 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 NICM (nonischemic cardiomyopathy) ([place]) 05/01/2013  . Insulin dependent type 2 diabetes mellitus ([place]) 04/10/2013  . Sex counseling 04/10/2013  . Edema leg 04/10/2013  . Upper respiratory infection, viral 04/10/2013  . Pedal edema 03/29/2013  . Pneumonia 12/12/2012  . Essential hypertension 12/12/2012  . HLD (hyperlipidemia) 12/12/2012  . Tonsillar mass 12/01/2012  . 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 Leukocytosis   . Anemia   . High cholesterol   . Hypertension   . Blurred vision  09/11/2011  . Foot pain 09/11/2011  . Breast pain 09/11/2011  . Morbid obesity ([place]) 09/11/2011  . Fungal infection of nail 09/11/2011  . Avitaminosis D 09/11/2011    Carey Bullocks, OTR/L 11/01/2019, 2:19 PM  [facility] [address] [place], [place], [address] Phone: [phone]   Fax:  [phone]  Name: KALEEYAH CUFFIE MRN: [id number] Date of Birth: February 03, 1965

## 2019-11-06 ENCOUNTER — Ambulatory Visit: Payer: Medicaid Other | Admitting: Occupational Therapy

## 2019-11-06 ENCOUNTER — Other Ambulatory Visit: Payer: Self-pay

## 2019-11-06 DIAGNOSIS — M6281 Muscle weakness (generalized): Secondary | ICD-10-CM

## 2019-11-06 DIAGNOSIS — M25612 Stiffness of left shoulder, not elsewhere classified: Secondary | ICD-10-CM

## 2019-11-06 DIAGNOSIS — R2681 Unsteadiness on feet: Secondary | ICD-10-CM

## 2019-11-06 NOTE — Therapy (Signed)
[facility] [address] [place], [place], [address] Phone: [phone]   Fax:  [phone]  Occupational Therapy Treatment  Patient Details  Name: Kaitlin [name] MRN: [id number] Date of Birth: 1965-09-25 No data recorded  Encounter Date: 11/06/2019   OT End of Session - 11/06/19 1122    Visit Number 5    Number of Visits 13    Date for OT Re-Evaluation 11/16/19    Authorization Type [employer] -    Authorization Time Period approved 12 visits 10/11 - 11/29/19    Authorization - Visit Number 4    Authorization - Number of Visits 12    OT Start Time 1100    OT Stop Time 1145    OT Time Calculation (min) 45 min    Activity Tolerance Patient tolerated treatment well    Behavior During Therapy [facility] for tasks assessed/performed           Past Medical History:  Diagnosis Date   Anemia    Bronchitis    Cataracts, bilateral 01/12/2018   CHF (congestive heart failure) ([place])    Diabetes mellitus    High cholesterol    Hypertension    takes medicine to protect kidneys, does not have HTN   Leukocytosis    Morbid obesity ([place]) 09/11/2011   BMI 57   Neuropathy 01/12/2018   NICM (nonischemic cardiomyopathy) ([place]) 05/01/2013   Overview:  Last Assessment & Plan:  euvolemic on physical exam.    Obesity    Sickle cell trait ([place])    Thymoma     Past Surgical History:  Procedure Laterality Date   COLONOSCOPY N/A 06/21/2017   Procedure: COLONOSCOPY;  Surgeon: Ronnette Juniper, MD;  [facility];  Service: Gastroenterology;  Laterality: N/A;   LEFT HEART CATHETERIZATION WITH CORONARY ANGIOGRAM N/A 05/25/2013   Procedure: LEFT HEART CATHETERIZATION WITH CORONARY ANGIOGRAM;  Surgeon: Troy Sine, MD;  Location: [facility] CATH LAB;  Service: Cardiovascular;  Laterality: N/A;   LYMPH NODE BIOPSY Right 11/30/2012   Procedure: RIGHT TONSIL BIOPSY WITH FRESH FROZEN ANALYSIS;  Surgeon: Jodi Marble, MD;   Location: [place];  Service: ENT;  Laterality: Right;   MEDIASTERNOTOMY N/A 01/12/2013   Procedure: MEDIAN STERNOTOMY;  Surgeon: Gaye Pollack, MD;  Location: [place];  Service: Thoracic;  Laterality: N/A;   MEDIASTINOTOMY CHAMBERLAIN MCNEIL Right 11/06/2012   Procedure: MEDIASTINOTOMY CHAMBERLAIN MCNEILPROCEDURE;  Surgeon: Gaye Pollack, MD;  Location: [place];  Service: Thoracic;  Laterality: Right;   POLYPECTOMY  06/21/2017   Procedure: POLYPECTOMY;  Surgeon: Ronnette Juniper, MD;  [facility];  Service: Gastroenterology;;   RESECTION OF A THYMOMA N/A 01/12/2013   Procedure: RESECTION OF A THYMOMA;  Surgeon: Gaye Pollack, MD;  Location: [facility];  Service: Thoracic;  Laterality: N/A;   TONSILLECTOMY     TUBAL LIGATION      There were no vitals filed for this visit.   Subjective Assessment - 11/06/19 1104    Subjective  Pt reports pain in back, knee and both arms. Comes and goes.    Pertinent History ICU myopathy s/p covid w/ hypoxemic respiratory failure d/t ARDS in [name] 2021. PMH: CHF, T2DM, sickle cell trait    Limitations monitor vitals prn, BP taken at Lt forearm    Patient Stated Goals increase reaching and endurance in my arms    Currently in Pain? Yes    Pain Score 3     Pain Orientation Right;Left  Pain Descriptors / Indicators Aching;Heaviness    Pain Type Chronic pain    Pain Onset More than a month ago    Pain Frequency Intermittent    Aggravating Factors  rainy/cold weather    Pain Relieving Factors tylenol helps           Pt's O2 sats stayed b/t 94-97 and HR b/t 90-118 during today's session.  Pt standing to place clothespins on chair w/ stepping pattern RUE and LUE then placing back on antenna with 3 rest breaks required.  Pt then sat to remove clothespins from antenna.  Discussed ways to gradually increase endurance at home. Further discussed task modifications and A/E needs including light weight vacuum and angled LH sponge.  UBE x 7 min, level 1 for UE  strength/endurance                       OT Short Term Goals - 11/06/19 1138      OT SHORT TERM GOAL #1   Title Independent with BUE A/ROM HEP    Baseline Dependent    Time 3    Period Weeks    Status Achieved      OT SHORT TERM GOAL #2   Title Pt to verbalize understanding with energy conservation techniques and memory strategies    Baseline dependent    Time 3    Period Weeks    Status Achieved      OT SHORT TERM GOAL #3   Title Pt to verbalize understanding with task modifications and potential A/E needs to increase ease with ADLS    Baseline Dependent    Time 3    Period Weeks    Status Achieved             OT Long Term Goals - 10/03/19 1423      OT LONG TERM GOAL #1   Title Pt will attend to Lt side with environmental scanning at 90% accuracy    Baseline has not yet attempted but reports occaisonally missing things on Lt side    Time 6    Period Weeks    Status New      OT LONG TERM GOAL #2   Title Pt to stand for 8 minutes for light IADL tasks w/ no major changes to vitals    Baseline 2-3 minutes    Time 6    Period Weeks    Status New      OT LONG TERM GOAL #3   Title Independent with strengtheing HEP for BUE's    Baseline dependent    Time 6    Period Weeks    Status New      OT LONG TERM GOAL #4   Title Pt to perform repetitive and sustained reaching x 2 minutes each UE for putting away groceries and dishes    Baseline can only do a few reps before fatiguing    Time 6    Period Weeks    Status New                 Plan - 11/06/19 1123    Clinical Impression Statement Pt with decreased endurance today and required more rest breaks. Pt has met all STG's. Pt reports she did not sleep well and under more stress today. O2 sats stayed b/t 94-97    OT Occupational Profile and History Detailed Assessment- Review of Records and additional review of physical, cognitive, psychosocial history related to current functional  performance    Occupational performance deficits (Please refer to evaluation for details): ADL's;IADL's;Leisure;Social Participation    Body Structure / Function / Physical Skills ADL;IADL;ROM;Endurance;Cardiopulmonary status limiting activity;Strength;UE functional use;Pain;Mobility;Decreased knowledge of use of DME;Vision    Cognitive Skills Attention;Memory    Rehab Potential Good    Clinical Decision Making Limited treatment options, no task modification necessary    Comorbidities Affecting Occupational Performance: May have comorbidities impacting occupational performance    Modification or Assistance to Complete Evaluation  No modification of tasks or assist necessary to complete eval    OT Frequency 2x / week    OT Duration 6 weeks   plus eval   OT Treatment/Interventions Self-care/ADL training;Therapeutic exercise;Functional Mobility Training;Neuromuscular education;Therapeutic activities;Coping strategies training;DME and/or AE instruction;Cognitive remediation/compensation;Visual/perceptual remediation/compensation;Passive range of motion;Patient/family education    Plan continue to work endurance, standing tolerance, reaching tasks (lower for Lt shoulder)    Consulted and Agree with Plan of Care Patient           Patient will benefit from skilled therapeutic intervention in order to improve the following deficits and impairments:   Body Structure / Function / Physical Skills: ADL, IADL, ROM, Endurance, Cardiopulmonary status limiting activity, Strength, UE functional use, Pain, Mobility, Decreased knowledge of use of DME, Vision Cognitive Skills: Attention, Memory     Visit Diagnosis: Unsteadiness on feet  Muscle weakness (generalized)  Stiffness of left shoulder, not elsewhere classified    Problem List Patient Active Problem List   Diagnosis Date Noted   Mixed stress and urge urinary incontinence 07/04/2019   Nerve pain 05/16/2019   Myofascial pain 05/16/2019    Subluxation of acromioclavicular joint, right, sequela 03/23/2019   Sickle cell trait ([place])    Labile blood glucose    Diabetic peripheral neuropathy (HCC)    Hypokalemia    Pain in joint of left shoulder    Debility    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   COVID-19    History of anemia    Super obese    Poorly controlled type 2 diabetes mellitus with peripheral neuropathy (HCC)    Acute on chronic diastolic (congestive) heart failure (HCC)    Hypernatremia    Shock circulatory ([place])    Septic shock ([place])    Hypotension 01/13/2019   Acute respiratory failure with hypoxemia ([place]) 01/13/2019   Acute respiratory distress syndrome (ARDS) due to COVID-19 virus ([place]) 01/12/2019   Grade I diastolic dysfunction 24/09/7351   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   Dyspnea on exertion 05/19/2016   Other fatigue 05/19/2016   Absolute anemia 04/15/2016   Left leg weakness 10/18/2014   Left leg paresthesias 10/18/2014   Acute left-sided weakness 10/18/2014   Encounter for central line placement 03/08/2014   Taking drug for chronic disease 03/08/2014   H/O insertion of insulin pump 02/18/2014   Long term current use of insulin ([place]) 02/18/2014   Insulin pump in place 02/18/2014   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   Abnormal ballistocardiogram 05/18/2013   Abnormal nuclear stress test 05/18/2013   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[place]) 05/01/2013   NICM (nonischemic cardiomyopathy) ([place]) 05/01/2013   Insulin dependent type 2 diabetes mellitus ([place]) 04/10/2013   Sex counseling 04/10/2013   Edema leg 04/10/2013   Upper respiratory infection, viral 04/10/2013   Pedal edema 03/29/2013   Pneumonia 12/12/2012   Essential hypertension 12/12/2012   HLD (hyperlipidemia) 12/12/2012   Tonsillar mass 12/01/2012   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 [place], [place], [address] Phone: [phone]   Fax:  [phone]  Name: Kaitlin [name] MRN: [id number] Date of Birth: June 16, 1965

## 2019-11-08 ENCOUNTER — Ambulatory Visit: Payer: Medicaid Other | Admitting: Occupational Therapy

## 2019-11-08 ENCOUNTER — Other Ambulatory Visit: Payer: Self-pay

## 2019-11-08 ENCOUNTER — Ambulatory Visit: Payer: Medicaid Other | Admitting: Physical Therapy

## 2019-11-08 DIAGNOSIS — M6281 Muscle weakness (generalized): Secondary | ICD-10-CM | POA: Diagnosis not present

## 2019-11-08 DIAGNOSIS — R2681 Unsteadiness on feet: Secondary | ICD-10-CM

## 2019-11-08 DIAGNOSIS — R2689 Other abnormalities of gait and mobility: Secondary | ICD-10-CM

## 2019-11-08 DIAGNOSIS — R262 Difficulty in walking, not elsewhere classified: Secondary | ICD-10-CM

## 2019-11-08 NOTE — Therapy (Signed)
[facility] [address] [place], [place], [address] Phone: [phone]   Fax:  [phone]  Physical Therapy Treatment  Patient Details  Name: Kaitlin [name] MRN: [id number] Date of Birth: August 01, 1965 Referring Provider (PT): Dr. Courtney Heys    Encounter Date: 11/08/2019   PT End of Session - 11/08/19 1151    Visit Number 8    Number of Visits 15    Date for PT Re-Evaluation --   awaiting Medicaid  re-auth   Authorization Type Medicaid - 8 PT visits approved 11/01/19- 11/22/19    Authorization - Visit Number 1    Authorization - Number of Visits 8    PT Start Time 1102    PT Stop Time 1145    PT Time Calculation (min) 43 min    Activity Tolerance Patient tolerated treatment well;Patient limited by fatigue    Behavior During Therapy [facility] for tasks assessed/performed           Past Medical History:  Diagnosis Date  . Anemia   . Bronchitis   . Cataracts, bilateral 01/12/2018  . CHF (congestive heart failure) ([place])   . Diabetes mellitus   . High cholesterol   . Hypertension    takes medicine to protect kidneys, does not have HTN  . Leukocytosis   . Morbid obesity ([place]) 09/11/2011   BMI 57  . Neuropathy 01/12/2018  . NICM (nonischemic cardiomyopathy) ([place]) 05/01/2013   Overview:  Last Assessment & Plan:  euvolemic on physical exam.   . Obesity   . Sickle cell trait ([place])   . Thymoma     Past Surgical History:  Procedure Laterality Date  . COLONOSCOPY N/A 06/21/2017   Procedure: COLONOSCOPY;  Surgeon: Ronnette Juniper, MD;  Location: [facility];  Service: Gastroenterology;  Laterality: N/A;  . LEFT HEART CATHETERIZATION WITH CORONARY ANGIOGRAM N/A 05/25/2013   Procedure: LEFT HEART CATHETERIZATION WITH CORONARY ANGIOGRAM;  Surgeon: Troy Sine, MD;  Location: [facility] CATH LAB;  Service: Cardiovascular;  Laterality: N/A;  . LYMPH NODE BIOPSY Right 11/30/2012   Procedure: RIGHT TONSIL BIOPSY WITH FRESH FROZEN  ANALYSIS;  Surgeon: Jodi Marble, MD;  Location: [place];  Service: ENT;  Laterality: Right;  . MEDIASTERNOTOMY N/A 01/12/2013   Procedure: MEDIAN STERNOTOMY;  Surgeon: Gaye Pollack, MD;  Location: [place];  Service: Thoracic;  Laterality: N/A;  . MEDIASTINOTOMY CHAMBERLAIN MCNEIL Right 11/06/2012   Procedure: MEDIASTINOTOMY CHAMBERLAIN MCNEILPROCEDURE;  Surgeon: Gaye Pollack, MD;  Location: [place];  Service: Thoracic;  Laterality: Right;  . POLYPECTOMY  06/21/2017   Procedure: POLYPECTOMY;  Surgeon: Ronnette Juniper, MD;  Location: [facility];  Service: Gastroenterology;;  . RESECTION OF A THYMOMA N/A 01/12/2013   Procedure: RESECTION OF A THYMOMA;  Surgeon: Gaye Pollack, MD;  Location: [place];  Service: Thoracic;  Laterality: N/A;  . TONSILLECTOMY    . TUBAL LIGATION      There were no vitals filed for this visit.   Subjective Assessment - 11/08/19 1105    Subjective Endurance hasn't changed much since she has last been here.    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 2-3 minutes    How long can you walk comfortably? 150' approx. back into therapy gym    Patient Stated Goals wants to be able to walk normal and not feel like she is going to topple over. standing up is  difficult    Currently in Pain? Yes    Pain Score 5    "it was there, but it is coming back more and more and is more intense"   Pain Location Back    Pain Orientation Right   mid thoracic   Pain Descriptors / Indicators Aching;Sore   "feels like a menstrual cramp in her back"   Pain Type Chronic pain    Pain Onset More than a month ago    Aggravating Factors  "doesn't know" - possibly turning too abruptly, moving too quickly, sitting and bending over to do things    Pain Relieving Factors sometimes tylenol helps, laying down              [facility] PT Assessment - 11/08/19 1110      6 Minute Walk- Baseline   6  Minute Walk- Baseline yes    BP (mmHg) 122/78    HR (bpm) 98    02 Sat (%RA) 94 %    Modified Borg Scale for Dyspnea 2- Mild shortness of breath      6 Minute walk- Post Test   6 Minute Walk Post Test yes    BP (mmHg) 125/87    HR (bpm) 112    02 Sat (%RA) 97 %    Modified Borg Scale for Dyspnea 2- Mild shortness of breath    Perceived Rate of Exertion (Borg) 13- Somewhat hard      6 minute walk test results    Endurance additional comments 3MWT = 295' , needing 1 brief  standing rest break and one seated rest break of approx. 20 seconds - during seated break O2 sats at 93-94%                              Balance Exercises - 11/08/19 0001      Balance Exercises: Standing   Standing Eyes Closed Narrow base of support (BOS);Foam/compliant surface;2 reps;30 secs   feet slightly apart    Standing Eyes Closed Limitations then performing feet slightly apart eyes closed x5 reps head turns, x5 reps head nods    SLS Eyes open;Solid surface;Limitations;Foam/compliant surface    SLS Limitations alternating foot taps to 6" step x10 reps each leg no UE support, performed same activity standing on air ex x5 reps B with fingertip support on chair    Other Standing Exercises on blue air ex: BUE support heel > toe raises x10 reps, no UE support alternating marching 10 reps                PT Short Term Goals - 10/19/19 1235      PT SHORT TERM GOAL #1   Title Pt will be independent with initial HEP in order to build upon functional gains made in therapy. ALL STGS DUE AFTER 6 VISITS.    Baseline has not been performing consistently lately due to not feeling well.    Time 6   visits   Status On-going      PT SHORT TERM GOAL #2   Title Pt will undergo BERG/DGI when pt is able to tolerate and LTG written as appropriate.    Baseline DGI assessed on 10/19/19 - 22/24    Time 6    Period --   visits   Status Achieved      PT SHORT TERM GOAL #3   Title Pt will ambulate at  least 150' with RPE rating  at 6-7/10 or less with supervision in order to improve functional mobility.    Baseline 230' with RPE at 5/10 afterwards.    Time 6   visits   Status Achieved      PT SHORT TERM GOAL #4   Title Pt will perform 5x sit <> stand wit BUE support in 24 seconds or less in order to demo improved BLE strength.    Baseline 27.87 seconds from 21" mat table, without UE support: 14.12 seconds on 10/19/19    Time 6   visits   Status Achieved             PT Long Term Goals - 11/08/19 1154      PT LONG TERM GOAL #1   Title Pt will be independent with final HEP in order to build upon functional gains made in therapy. ALL LTGS DUE AFTER 15 VISITS.    Baseline pt has not been performing at home.    Time 15   visits   Status New      PT LONG TERM GOAL #2   Title Pt will improve 3MWT distance to at least 340' in order to demo improved walking endurance.    Baseline 295' on 11/08/19    Time 15   visits   Status Revised      PT LONG TERM GOAL #3   Title Pt will ambulate at least 300' over indoor surfaces with RPE rating at 3-4/10 or less with supervision in order to improve functional mobility.    Baseline 5-6/10 RPE after 230' indoors    Time 15   visits   Status Revised      PT LONG TERM GOAL #4   Title Pt will improve 30 second chair stand to at least 11 sit <> stands from standard height mat table to demo improved functional BLE strength.    Baseline 10 sit <> stands from 21" mat table    Time 15   visits   Status Revised      PT LONG TERM GOAL #5   Title Pt will improve gait speed to at least 2.8 ft/sec in order to demo improved community mobility.    Baseline 2.44 ft/sec    Time 15   visits   Status New                 Plan - 11/08/19 1159    Clinical Impression Statement Performed 3MWT today to assess walking endurance - pt ambulated 295' with needing one seated rest break and one standing rest break. Vitals WFL. LTG revised as appropriate.  Remainder of session focused on standing balance with decr UE support, pt needing intermittent seated rest breaks between activities. Will continue to progress towards LTGs.    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, CHF, diabetes mellitus, HTN, neuropathy, obesity    Examination-Activity Limitations Stand;Sit;Transfers;Stairs;Squat;Locomotion Level    Examination-Participation Restrictions Church;Community Activity    Stability/Clinical Decision Making Evolving/Moderate complexity    Rehab Potential Good    PT Frequency 2x / week    PT Duration 4 weeks    PT Treatment/Interventions ADLs/Self Care Home Management;DME Instruction;Gait training;Stair training;Functional mobility training;Therapeutic activities;Balance training;Therapeutic exercise;Neuromuscular re-education;Patient/family education;Energy conservation    PT Next Visit Plan seated and standing functional strengthening,  standing balance. walking endurance.    PT Home Exercise Plan [facility]    Consulted and Agree with Plan of Care  Patient           Patient will benefit from skilled therapeutic intervention in order to improve the following deficits and impairments:  Abnormal gait, Cardiopulmonary status limiting activity, Decreased activity tolerance, Decreased balance, Decreased coordination, Decreased endurance, Decreased mobility, Difficulty walking, Decreased strength, Impaired sensation, Postural dysfunction, Obesity  Visit Diagnosis: Unsteadiness on feet  Muscle weakness (generalized)  Difficulty in walking, not elsewhere classified  Other abnormalities of gait and mobility     Problem List Patient Active Problem List   Diagnosis Date Noted  . Mixed stress and urge urinary incontinence 07/04/2019  . Nerve pain 05/16/2019  . Myofascial pain 05/16/2019  . Subluxation  of acromioclavicular joint, right, sequela 03/23/2019  . Sickle cell trait ([place])   . Labile blood glucose   . Diabetic peripheral neuropathy ([place])   . Hypokalemia   . Pain in joint of left shoulder   . Debility   . 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 COVID-19   . History of anemia   . Super obese   . Poorly controlled type 2 diabetes mellitus with peripheral neuropathy ([place])   . Acute on chronic diastolic (congestive) heart failure ([place])   . Hypernatremia   . Shock circulatory ([place])   . Septic shock ([place])   . Hypotension 01/13/2019  . Acute respiratory failure with hypoxemia ([place]) 01/13/2019  . Acute respiratory distress syndrome (ARDS) due to COVID-19 virus ([place]) 01/12/2019  . Grade I diastolic dysfunction 37/10/6267  . 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 Dyspnea on exertion 05/19/2016  . Other fatigue 05/19/2016  . Absolute anemia 04/15/2016  . Left leg weakness 10/18/2014  . Left leg paresthesias 10/18/2014  . Acute left-sided weakness 10/18/2014  . Encounter for central line placement 03/08/2014  . Taking drug for chronic disease 03/08/2014  . H/O insertion of insulin pump 02/18/2014  . Long term current use of insulin ([place]) 02/18/2014  . Insulin pump in place 02/18/2014  . 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 Abnormal ballistocardiogram 05/18/2013  . Abnormal nuclear stress test 05/18/2013  . 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 NICM (nonischemic cardiomyopathy) ([place]) 05/01/2013  . Insulin dependent type 2 diabetes mellitus ([place]) 04/10/2013  . Sex counseling 04/10/2013  . Edema leg 04/10/2013  . Upper respiratory infection, viral 04/10/2013  . Pedal edema 03/29/2013  . Pneumonia 12/12/2012  . Essential hypertension 12/12/2012  . HLD (hyperlipidemia) 12/12/2012  . Tonsillar mass 12/01/2012  . 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 Leukocytosis   . Anemia   . High cholesterol   . Hypertension   . Blurred vision 09/11/2011  .  Foot pain 09/11/2011  . Breast pain 09/11/2011  . Morbid obesity ([place]) 09/11/2011  . Fungal infection of nail 09/11/2011  . Avitaminosis D 09/11/2011    Arliss Journey, PT, DPT  11/08/2019, 12:02 PM  [facility] [address] [place], [place], [address] Phone: [phone]   Fax:  [phone]  Name: Kaitlin [name] MRN: [id number] Date of Birth: 1965-01-22

## 2019-11-13 ENCOUNTER — Ambulatory Visit: Payer: Medicaid Other | Admitting: Physical Therapy

## 2019-11-13 ENCOUNTER — Ambulatory Visit: Payer: Medicaid Other | Admitting: Occupational Therapy

## 2019-11-15 ENCOUNTER — Ambulatory Visit: Payer: Medicaid Other | Admitting: Physical Therapy

## 2019-11-15 ENCOUNTER — Ambulatory Visit: Payer: Medicaid Other | Attending: Physical Medicine and Rehabilitation | Admitting: Occupational Therapy

## 2019-11-15 ENCOUNTER — Other Ambulatory Visit: Payer: Self-pay

## 2019-11-15 DIAGNOSIS — R2689 Other abnormalities of gait and mobility: Secondary | ICD-10-CM | POA: Diagnosis present

## 2019-11-15 DIAGNOSIS — R2681 Unsteadiness on feet: Secondary | ICD-10-CM

## 2019-11-15 DIAGNOSIS — M25612 Stiffness of left shoulder, not elsewhere classified: Secondary | ICD-10-CM | POA: Diagnosis present

## 2019-11-15 DIAGNOSIS — R262 Difficulty in walking, not elsewhere classified: Secondary | ICD-10-CM

## 2019-11-15 DIAGNOSIS — M6281 Muscle weakness (generalized): Secondary | ICD-10-CM | POA: Diagnosis not present

## 2019-11-15 NOTE — Therapy (Signed)
[facility] [address] [place], [place], [address] Phone: [phone]   Fax:  [phone]  Occupational Therapy Treatment  Patient Details  Name: Kaitlin [name] MRN: [id number] Date of Birth: Mar 17, 1965 No data recorded  Encounter Date: 11/15/2019   OT End of Session - 11/15/19 1139    Visit Number 6    Number of Visits 13    Date for OT Re-Evaluation 11/16/19    Authorization Type [employer] -    Authorization Time Period approved 12 visits 10/11 - 11/29/19    Authorization - Visit Number 5    Authorization - Number of Visits 12    OT Start Time 9485    OT Stop Time 1145    OT Time Calculation (min) 40 min    Activity Tolerance Patient tolerated treatment well    Behavior During Therapy [facility] for tasks assessed/performed           Past Medical History:  Diagnosis Date  . Anemia   . Bronchitis   . Cataracts, bilateral 01/12/2018  . CHF (congestive heart failure) ([place])   . Diabetes mellitus   . High cholesterol   . Hypertension    takes medicine to protect kidneys, does not have HTN  . Leukocytosis   . Morbid obesity ([place]) 09/11/2011   BMI 57  . Neuropathy 01/12/2018  . NICM (nonischemic cardiomyopathy) ([place]) 05/01/2013   Overview:  Last Assessment & Plan:  euvolemic on physical exam.   . Obesity   . Sickle cell trait ([place])   . Thymoma     Past Surgical History:  Procedure Laterality Date  . COLONOSCOPY N/A 06/21/2017   Procedure: COLONOSCOPY;  Surgeon: Ronnette Juniper, MD;  Location: [facility];  Service: Gastroenterology;  Laterality: N/A;  . LEFT HEART CATHETERIZATION WITH CORONARY ANGIOGRAM N/A 05/25/2013   Procedure: LEFT HEART CATHETERIZATION WITH CORONARY ANGIOGRAM;  Surgeon: Troy Sine, MD;  Location: [facility] CATH LAB;  Service: Cardiovascular;  Laterality: N/A;  . LYMPH NODE BIOPSY Right 11/30/2012   Procedure: RIGHT TONSIL BIOPSY WITH FRESH FROZEN ANALYSIS;  Surgeon: Jodi Marble, MD;   Location: [place];  Service: ENT;  Laterality: Right;  . MEDIASTERNOTOMY N/A 01/12/2013   Procedure: MEDIAN STERNOTOMY;  Surgeon: Gaye Pollack, MD;  Location: [place];  Service: Thoracic;  Laterality: N/A;  . MEDIASTINOTOMY CHAMBERLAIN MCNEIL Right 11/06/2012   Procedure: MEDIASTINOTOMY CHAMBERLAIN MCNEILPROCEDURE;  Surgeon: Gaye Pollack, MD;  Location: [place];  Service: Thoracic;  Laterality: Right;  . POLYPECTOMY  06/21/2017   Procedure: POLYPECTOMY;  Surgeon: Ronnette Juniper, MD;  Location: [facility];  Service: Gastroenterology;;  . RESECTION OF A THYMOMA N/A 01/12/2013   Procedure: RESECTION OF A THYMOMA;  Surgeon: Gaye Pollack, MD;  Location: [place];  Service: Thoracic;  Laterality: N/A;  . TONSILLECTOMY    . TUBAL LIGATION      There were no vitals filed for this visit.   Subjective Assessment - 11/15/19 1106    Subjective  Pt reports pain in back, knee and both arms, as well as chest the last couple days. Comes and goes.    Pertinent History ICU myopathy s/p covid w/ hypoxemic respiratory failure d/t ARDS in [name] 2021. PMH: CHF, T2DM, sickle cell trait    Limitations monitor vitals prn, BP taken at Lt forearm    Patient Stated Goals increase reaching and endurance in my arms    Currently in Pain? Yes    Pain Score 5  Pain Location Generalized    Pain Descriptors / Indicators Sharp;Aching    Pain Type Chronic pain    Pain Onset More than a month ago    Pain Frequency Intermittent    Aggravating Factors  nothing    Pain Relieving Factors nothing                        OT Treatments/Exercises (OP) - 11/15/19 0001      ADLs   Functional Mobility standing to retrieve cones from high shelf RUE, then LUE then stepping to side to place further away bilateral sides for standing endurance. Pt had to rest x 2. Pt then reaching for items off floor and picking up/replacing off floor with multiple rest breaks and O2 sats dropping to 93%. O2 sats b/t 93-98 today       Exercises   Exercises Shoulder      Shoulder Exercises: ROM/Strengthening   UBE (Upper Arm Bike) UBE x 8 min, level 1 with no rest breaks    Other ROM/Strengthening Exercises Cane ex's in sh flex/ext, bilateral sh abd, horizontal abd, overhead press, sh extension, and IR X 10 reps each with rest breaks prn                    OT Short Term Goals - 11/06/19 1138      OT SHORT TERM GOAL #1   Title Independent with BUE A/ROM HEP    Baseline Dependent    Time 3    Period Weeks    Status Achieved      OT SHORT TERM GOAL #2   Title Pt to verbalize understanding with energy conservation techniques and memory strategies    Baseline dependent    Time 3    Period Weeks    Status Achieved      OT SHORT TERM GOAL #3   Title Pt to verbalize understanding with task modifications and potential A/E needs to increase ease with ADLS    Baseline Dependent    Time 3    Period Weeks    Status Achieved             OT Long Term Goals - 10/03/19 1423      OT LONG TERM GOAL #1   Title Pt will attend to Lt side with environmental scanning at 90% accuracy    Baseline has not yet attempted but reports occaisonally missing things on Lt side    Time 6    Period Weeks    Status New      OT LONG TERM GOAL #2   Title Pt to stand for 8 minutes for light IADL tasks w/ no major changes to vitals    Baseline 2-3 minutes    Time 6    Period Weeks    Status New      OT LONG TERM GOAL #3   Title Independent with strengtheing HEP for BUE's    Baseline dependent    Time 6    Period Weeks    Status New      OT LONG TERM GOAL #4   Title Pt to perform repetitive and sustained reaching x 2 minutes each UE for putting away groceries and dishes    Baseline can only do a few reps before fatiguing    Time 6    Period Weeks    Status New  Plan - 11/15/19 1140    Clinical Impression Statement Pt gradually improving endurance with most rest breaks required during  standing activities    OT Occupational Profile and History Detailed Assessment- Review of Records and additional review of physical, cognitive, psychosocial history related to current functional performance    Occupational performance deficits (Please refer to evaluation for details): ADL's;IADL's;Leisure;Social Participation    Body Structure / Function / Physical Skills ADL;IADL;ROM;Endurance;Cardiopulmonary status limiting activity;Strength;UE functional use;Pain;Mobility;Decreased knowledge of use of DME;Vision    Cognitive Skills Attention;Memory    Rehab Potential Good    Clinical Decision Making Limited treatment options, no task modification necessary    Comorbidities Affecting Occupational Performance: May have comorbidities impacting occupational performance    Modification or Assistance to Complete Evaluation  No modification of tasks or assist necessary to complete eval    OT Frequency 2x / week    OT Duration 6 weeks   plus eval   OT Treatment/Interventions Self-care/ADL training;Therapeutic exercise;Functional Mobility Training;Neuromuscular education;Therapeutic activities;Coping strategies training;DME and/or AE instruction;Cognitive remediation/compensation;Visual/perceptual remediation/compensation;Passive range of motion;Patient/family education    Plan continue progress towards goals - may need MCD extension    Consulted and Agree with Plan of Care Patient           Patient will benefit from skilled therapeutic intervention in order to improve the following deficits and impairments:   Body Structure / Function / Physical Skills: ADL, IADL, ROM, Endurance, Cardiopulmonary status limiting activity, Strength, UE functional use, Pain, Mobility, Decreased knowledge of use of DME, Vision Cognitive Skills: Attention, Memory     Visit Diagnosis: Muscle weakness (generalized)  Unsteadiness on feet  Stiffness of left shoulder, not elsewhere classified    Problem  List Patient Active Problem List   Diagnosis Date Noted  . Mixed stress and urge urinary incontinence 07/04/2019  . Nerve pain 05/16/2019  . Myofascial pain 05/16/2019  . Subluxation of acromioclavicular joint, right, sequela 03/23/2019  . Sickle cell trait ([place])   . Labile blood glucose   . Diabetic peripheral neuropathy ([place])   . Hypokalemia   . Pain in joint of left shoulder   . Debility   . 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 COVID-19   . History of anemia   . Super obese   . Poorly controlled type 2 diabetes mellitus with peripheral neuropathy ([place])   . Acute on chronic diastolic (congestive) heart failure ([place])   . Hypernatremia   . Shock circulatory ([place])   . Septic shock ([place])   . Hypotension 01/13/2019  . Acute respiratory failure with hypoxemia ([place]) 01/13/2019  . Acute respiratory distress syndrome (ARDS) due to COVID-19 virus ([place]) 01/12/2019  . Grade I diastolic dysfunction 28/41/3244  . 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 Dyspnea on exertion 05/19/2016  . Other fatigue 05/19/2016  . Absolute anemia 04/15/2016  . Left leg weakness 10/18/2014  . Left leg paresthesias 10/18/2014  . Acute left-sided weakness 10/18/2014  . Encounter for central line placement 03/08/2014  . Taking drug for chronic disease 03/08/2014  . H/O insertion of insulin pump 02/18/2014  . Long term current use of insulin ([place]) 02/18/2014  . Insulin pump in place 02/18/2014  . 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 Abnormal ballistocardiogram 05/18/2013  . Abnormal nuclear stress test 05/18/2013  . 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 NICM (nonischemic cardiomyopathy) ([place]) 05/01/2013  . Insulin dependent type 2 diabetes mellitus ([place]) 04/10/2013  . Sex counseling 04/10/2013  . Edema leg 04/10/2013  . Upper respiratory infection, viral  04/10/2013  . Pedal edema 03/29/2013  . Pneumonia 12/12/2012  . Essential hypertension 12/12/2012  . HLD (hyperlipidemia) 12/12/2012  .  Tonsillar mass 12/01/2012  . 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 Leukocytosis   . Anemia   . High cholesterol   . Hypertension   . Blurred vision 09/11/2011  . Foot pain 09/11/2011  . Breast pain 09/11/2011  . Morbid obesity ([place]) 09/11/2011  . Fungal infection of nail 09/11/2011  . Avitaminosis D 09/11/2011    Carey Bullocks, OTR/L 11/15/2019, 11:43 AM  [facility] [address] [place], [place], [address] Phone: [phone]   Fax:  [phone]  Name: Kaitlin [name] MRN: [id number] Date of Birth: 1966/01/05

## 2019-11-15 NOTE — Therapy (Addendum)
[facility] [address] [place], [place], [address] Phone: [phone]   Fax:  [phone]  Physical Therapy Treatment/Medicaid Re-Auth  Patient Details  Name: Kaitlin [name] MRN: [id number] Date of Birth: 02-28-1965 Referring Provider (PT): Dr. Courtney Heys    Encounter Date: 11/15/2019   PT End of Session - 11/15/19 1221    Visit Number 9    Number of Visits 15    Date for PT Re-Evaluation --   awaiting Medicaid  re-auth   Authorization Type Medicaid - 8 PT visits approved 11/01/19- 11/22/19    Authorization - Visit Number 2    Authorization - Number of Visits 8    PT Start Time 1150    PT Stop Time 1230    PT Time Calculation (min) 40 min    Activity Tolerance Patient tolerated treatment well;Patient limited by fatigue    Behavior During Therapy [facility] for tasks assessed/performed           Past Medical History:  Diagnosis Date  . Anemia   . Bronchitis   . Cataracts, bilateral 01/12/2018  . CHF (congestive heart failure) ([place])   . Diabetes mellitus   . High cholesterol   . Hypertension    takes medicine to protect kidneys, does not have HTN  . Leukocytosis   . Morbid obesity ([place]) 09/11/2011   BMI 57  . Neuropathy 01/12/2018  . NICM (nonischemic cardiomyopathy) ([place]) 05/01/2013   Overview:  Last Assessment & Plan:  euvolemic on physical exam.   . Obesity   . Sickle cell trait ([place])   . Thymoma     Past Surgical History:  Procedure Laterality Date  . COLONOSCOPY N/A 06/21/2017   Procedure: COLONOSCOPY;  Surgeon: Ronnette Juniper, MD;  Location: [facility];  Service: Gastroenterology;  Laterality: N/A;  . LEFT HEART CATHETERIZATION WITH CORONARY ANGIOGRAM N/A 05/25/2013   Procedure: LEFT HEART CATHETERIZATION WITH CORONARY ANGIOGRAM;  Surgeon: Troy Sine, MD;  Location: [facility] CATH LAB;  Service: Cardiovascular;  Laterality: N/A;  . LYMPH NODE BIOPSY Right 11/30/2012   Procedure: RIGHT TONSIL BIOPSY WITH  FRESH FROZEN ANALYSIS;  Surgeon: Jodi Marble, MD;  Location: [facility];  Service: ENT;  Laterality: Right;  . MEDIASTERNOTOMY N/A 01/12/2013   Procedure: MEDIAN STERNOTOMY;  Surgeon: Gaye Pollack, MD;  Location: [facility];  Service: Thoracic;  Laterality: N/A;  . MEDIASTINOTOMY CHAMBERLAIN MCNEIL Right 11/06/2012   Procedure: MEDIASTINOTOMY CHAMBERLAIN MCNEILPROCEDURE;  Surgeon: Gaye Pollack, MD;  Location: [facility];  Service: Thoracic;  Laterality: Right;  . POLYPECTOMY  06/21/2017   Procedure: POLYPECTOMY;  Surgeon: Ronnette Juniper, MD;  Location: [facility];  Service: Gastroenterology;;  . RESECTION OF A THYMOMA N/A 01/12/2013   Procedure: RESECTION OF A THYMOMA;  Surgeon: Gaye Pollack, MD;  Location: [facility];  Service: Thoracic;  Laterality: N/A;  . TONSILLECTOMY    . TUBAL LIGATION      There were no vitals filed for this visit.   Subjective Assessment - 11/15/19 1202    Subjective Pt states she's been walking to the mailbox from her apartment about 2x/day. Pt notes that this has not felt much easier this past week. No new complaints or issues.    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 2-3 minutes    How long can you walk comfortably? 150' approx. back into therapy gym    Patient Stated  Goals wants to be able to walk normal and not feel like she is going to topple over. standing up is difficult                             [facility] Adult PT Treatment/Exercise - 11/15/19 1201      Knee/Hip Exercises: Aerobic   Other Aerobic Sci-fit: RPM 50-60 x 10 min L2 UE & LE      Knee/Hip Exercises: Standing   Forward Step Up --    Step Down --    Other Standing Knee Exercises Chair squat 2x10 reps    Other Standing Knee Exercises Tap feet forward 2x10 on 6" step; lateral side tap 2x10 on 6" step                    PT Short Term Goals - 10/19/19 1235      PT  SHORT TERM GOAL #1   Title Pt will be independent with initial HEP in order to build upon functional gains made in therapy. ALL STGS DUE AFTER 6 VISITS.    Baseline has not been performing consistently lately due to not feeling well.    Time 6   visits   Status On-going      PT SHORT TERM GOAL #2   Title Pt will undergo BERG/DGI when pt is able to tolerate and LTG written as appropriate.    Baseline DGI assessed on 10/19/19 - 22/24    Time 6    Period --   visits   Status Achieved      PT SHORT TERM GOAL #3   Title Pt will ambulate at least 150' with RPE rating at 6-7/10 or less with supervision in order to improve functional mobility.    Baseline 230' with RPE at 5/10 afterwards.    Time 6   visits   Status Achieved      PT SHORT TERM GOAL #4   Title Pt will perform 5x sit <> stand wit BUE support in 24 seconds or less in order to demo improved BLE strength.    Baseline 27.87 seconds from 21" mat table, without UE support: 14.12 seconds on 10/19/19    Time 6   visits   Status Achieved          On-going LTGs as pt was only seen for 2 visits with previous Medicaid re-auth:    PT Long Term Goals - 11/08/19 1154      PT LONG TERM GOAL #1   Title Pt will be independent with final HEP in order to build upon functional gains made in therapy. ALL LTGS DUE AFTER 15 VISITS.    Baseline pt has not been performing at home.    Time 15   visits   Status New      PT LONG TERM GOAL #2   Title Pt will improve 3MWT distance to at least 340' in order to demo improved walking endurance.    Baseline 295' on 11/08/19    Time 15   visits   Status Revised      PT LONG TERM GOAL #3   Title Pt will ambulate at least 300' over indoor surfaces with RPE rating at 3-4/10 or less with supervision in order to improve functional mobility.    Baseline 5-6/10 RPE after 230' indoors    Time 15   visits   Status Revised      PT  LONG TERM GOAL #4   Title Pt will improve 30 second chair stand to at least  11 sit <> stands from standard height mat table to demo improved functional BLE strength.    Baseline 10 sit <> stands from 21" mat table    Time 15   visits   Status Revised      PT LONG TERM GOAL #5   Title Pt will improve gait speed to at least 2.8 ft/sec in order to demo improved community mobility.    Baseline 2.44 ft/sec    Time 15   visits   Status New               Plan - 11/15/19 1216    Clinical Impression Statement Worked on endurance using sci-fit today for longer low level load. Attempted to use nu-step; however, placed too much strain on pt's hips. Continued to progress functional strengthening in standing. Pt continues to make progress towards goals. SpO2 remained between 94-98%. HR in 90s-120s with activity. Addendum by primary PT on 12/10/19: pt unable to participate in therapy from remaining visits due to pt not feeling well or having to take care of her granddaughter. Will request re-auth for an additional 2x week for 3 weeks in order to continue to work on BLE strengthening, endurance, and balance. All LTGs are on-going due to being unable to asssess.    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, CHF, diabetes mellitus, HTN, neuropathy, obesity    Examination-Activity Limitations Stand;Sit;Transfers;Stairs;Squat;Locomotion Level    Examination-Participation Restrictions Church;Community Activity    Stability/Clinical Decision Making Evolving/Moderate complexity    Rehab Potential Good    PT Frequency 2x / week    PT Duration 3 weeks    PT Treatment/Interventions ADLs/Self Care Home Management;DME Instruction;Gait training;Stair training;Functional mobility training;Therapeutic activities;Balance training;Therapeutic exercise;Neuromuscular re-education;Patient/family education;Energy conservation    PT Next Visit Plan seated  and standing functional strengthening,  standing balance. walking endurance.    PT Home Exercise Plan [facility]    Consulted and Agree with Plan of Care Patient           Patient will benefit from skilled therapeutic intervention in order to improve the following deficits and impairments:  Abnormal gait, Cardiopulmonary status limiting activity, Decreased activity tolerance, Decreased balance, Decreased coordination, Decreased endurance, Decreased mobility, Difficulty walking, Decreased strength, Impaired sensation, Postural dysfunction, Obesity  Visit Diagnosis: Muscle weakness (generalized)  Unsteadiness on feet  Difficulty in walking, not elsewhere classified  Other abnormalities of gait and mobility     Problem List Patient Active Problem List   Diagnosis Date Noted  . Mixed stress and urge urinary incontinence 07/04/2019  . Nerve pain 05/16/2019  . Myofascial pain 05/16/2019  . Subluxation of acromioclavicular joint, right, sequela 03/23/2019  . Sickle cell trait ([place])   . Labile blood glucose   . Diabetic peripheral neuropathy ([place])   . Hypokalemia   . Pain in joint of left shoulder   . Debility   . 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 COVID-19   . History of anemia   . Super obese   . Poorly controlled type 2 diabetes mellitus with peripheral neuropathy ([place])   . Acute on chronic diastolic (congestive) heart failure ([place])   . Hypernatremia   . Shock circulatory ([place])   . Septic shock ([place])   . Hypotension 01/13/2019  . Acute respiratory failure with hypoxemia ([place]) 01/13/2019  .  Acute respiratory distress syndrome (ARDS) due to COVID-19 virus ([place]) 01/12/2019  . Grade I diastolic dysfunction 01/00/7121  . 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 Dyspnea on exertion 05/19/2016  . Other fatigue 05/19/2016  . Absolute anemia 04/15/2016  . Left leg weakness 10/18/2014  . Left leg paresthesias 10/18/2014  . Acute left-sided weakness 10/18/2014  . Encounter for  central line placement 03/08/2014  . Taking drug for chronic disease 03/08/2014  . H/O insertion of insulin pump 02/18/2014  . Long term current use of insulin ([place]) 02/18/2014  . Insulin pump in place 02/18/2014  . 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 Abnormal ballistocardiogram 05/18/2013  . Abnormal nuclear stress test 05/18/2013  . 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 NICM (nonischemic cardiomyopathy) ([place]) 05/01/2013  . Insulin dependent type 2 diabetes mellitus ([place]) 04/10/2013  . Sex counseling 04/10/2013  . Edema leg 04/10/2013  . Upper respiratory infection, viral 04/10/2013  . Pedal edema 03/29/2013  . Pneumonia 12/12/2012  . Essential hypertension 12/12/2012  . HLD (hyperlipidemia) 12/12/2012  . Tonsillar mass 12/01/2012  . 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 Leukocytosis   . Anemia   . High cholesterol   . Hypertension   . Blurred vision 09/11/2011  . Foot pain 09/11/2011  . Breast pain 09/11/2011  . Morbid obesity ([place]) 09/11/2011  . Fungal infection of nail 09/11/2011  . Avitaminosis D 09/11/2011    Tajana Crotteau April Ma L Khalib Fendley 11/15/2019, 12:41 PM  [facility] [address] [place], [place], [address] Phone: [phone]   Fax:  [phone]  Name: Kaitlin [name] MRN: [id number] Date of Birth: 21-Sep-1965    Addendum by: Janann August, PT, DPT 12/10/19 4:09 PM

## 2019-11-16 ENCOUNTER — Encounter: Payer: Medicaid Other | Admitting: Physical Medicine and Rehabilitation

## 2019-11-19 ENCOUNTER — Encounter
Payer: Medicaid Other | Attending: Physical Medicine and Rehabilitation | Admitting: Physical Medicine and Rehabilitation

## 2019-11-19 ENCOUNTER — Other Ambulatory Visit: Payer: Self-pay

## 2019-11-19 ENCOUNTER — Encounter: Payer: Self-pay | Admitting: Physical Medicine and Rehabilitation

## 2019-11-19 VITALS — BP 128/71 | HR 97 | Temp 97.9°F | Ht 69.0 in | Wt 382.4 lb

## 2019-11-19 DIAGNOSIS — M7918 Myalgia, other site: Secondary | ICD-10-CM | POA: Diagnosis not present

## 2019-11-19 DIAGNOSIS — M792 Neuralgia and neuritis, unspecified: Secondary | ICD-10-CM | POA: Insufficient documentation

## 2019-11-19 DIAGNOSIS — R202 Paresthesia of skin: Secondary | ICD-10-CM | POA: Diagnosis not present

## 2019-11-19 DIAGNOSIS — G7281 Critical illness myopathy: Secondary | ICD-10-CM | POA: Diagnosis not present

## 2019-11-19 NOTE — Progress Notes (Signed)
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    Having to live with daughter.    Daughter let BF come back- dropped restraining order.   Sat in w/c- and slid out- w/c is broken- last week.    Supposed to use rollator- but doesn't- doesn't want to get, and hasn't fallen lately.     Exam: Awake, alert, appropriate, BMI 56, NAD UEs: 5-/5 in deltoids, biceps, triceps, and 5/5 in WE, grip and finger abd LE's: HF 5-/5, KE/KF 5-/5, DF 4/5 on L; 4+/5 on R; PF 5-/5   Plan: 1. Patient NEEDED hospital bed for the last year- due to impaired strength, from  severe ICU myopathy- was initially [facility] - last visit was walking with RW and is still overall weak- still using railing to get up and down- but not needing to lift up and down anymore- as of the last month. Therefore, needed the hospital bed up until now, but NOW is able to go without it. So, can be removed form the home as of now- please inform pt when coming to get it.  Stopped paying 10/12.  So need to do appeal - to appeal til now.  2. Also , call supplier to come pick up bed at your convenience.   3. Patient here for trigger point injections for  Consent done and on chart.  Cleaned areas with alcohol and injected using a 27 gauge 1.5 inch needle  Injected 6cc Using 1% Lidocaine with no EPI  Upper traps B/L Levators B/L Posterior scalenes B/L Middle scalenes Splenius Capitus b/L Pectoralis Major R only Rhomboids B/L x3 Infraspinatus Teres Major/minor Thoracic paraspinals B/L x2 Lumbar paraspinals Other injections-    Patient's level of pain prior was 6/10 Current level of pain after injections is about the same  There was no bleeding or complications.  Patient was advised to drink a lot of water on day after injections to flush system Will have increased soreness for 12-48 hours after injections.  Can use Lidocaine patches the day AFTER  injections Can use theracane on day of injections in places didn't inject Can use heating pad /ice 4-6 hours AFTER injections  4. Still needing bladder supplies- no change.   5. F/U in 6 weeks   I spent a total of 25 minutes on visit- as detailed above.

## 2019-11-19 NOTE — Patient Instructions (Signed)
Exam: Awake, alert, appropriate, BMI 56, NAD UEs: 5-/5 in deltoids, biceps, triceps, and 5/5 in WE, grip and finger abd LE's: HF 5-/5, KE/KF 5-/5, DF 4/5 on L; 4+/5 on R; PF 5-/5   Plan: 1. Patient NEEDED hospital bed for the last year- due to impaired strength, from  severe ICU myopathy- was initially [facility] - last visit was walking with RW and is still overall weak- still using railing to get up and down- but not needing to lift up and down anymore- as of the last month. Therefore, needed the hospital bed up until now, but NOW is able to go without it. So, can be removed form the home as of now- please inform pt when coming to get it.  Stopped paying 10/12.  So need to do appeal - to appeal til now.  2. Also , call supplier to come pick up bed at your convenience.   3. Patient here for trigger point injections for  Consent done and on chart.  Cleaned areas with alcohol and injected using a 27 gauge 1.5 inch needle  Injected 6cc Using 1% Lidocaine with no EPI  Upper traps B/L Levators B/L Posterior scalenes B/L Middle scalenes Splenius Capitus b/L Pectoralis Major R only Rhomboids B/L x3 Infraspinatus Teres Major/minor Thoracic paraspinals B/L x2 Lumbar paraspinals Other injections-    Patient's level of pain prior was 6/10 Current level of pain after injections is about the same  There was no bleeding or complications.  Patient was advised to drink a lot of water on day after injections to flush system Will have increased soreness for 12-48 hours after injections.  Can use Lidocaine patches the day AFTER injections Can use theracane on day of injections in places didn't inject Can use heating pad /ice 4-6 hours AFTER injections  4. Still needing bladder supplies- no change.   5. F/U in 6 weeks

## 2019-11-20 ENCOUNTER — Encounter: Payer: Self-pay | Admitting: Physical Therapy

## 2019-11-20 ENCOUNTER — Ambulatory Visit: Payer: Medicaid Other | Admitting: Occupational Therapy

## 2019-11-20 ENCOUNTER — Ambulatory Visit: Payer: Medicaid Other | Admitting: Physical Therapy

## 2019-11-20 DIAGNOSIS — R2681 Unsteadiness on feet: Secondary | ICD-10-CM

## 2019-11-20 DIAGNOSIS — R262 Difficulty in walking, not elsewhere classified: Secondary | ICD-10-CM

## 2019-11-20 DIAGNOSIS — M6281 Muscle weakness (generalized): Secondary | ICD-10-CM

## 2019-11-20 DIAGNOSIS — R2689 Other abnormalities of gait and mobility: Secondary | ICD-10-CM

## 2019-11-20 NOTE — Therapy (Signed)
[facility] [address] [place], [place], [address] Phone: [phone]   Fax:  [phone]  Occupational Therapy Treatment  Patient Details  Name: Kaitlin [name] MRN: [id number] Date of Birth: 1965/03/15 No data recorded  Encounter Date: 11/20/2019   OT End of Session - 11/20/19 1134    Visit Number 7    Number of Visits 13    Date for OT Re-Evaluation 11/16/19    Authorization Type [employer] -    Authorization Time Period approved 12 visits 10/11 - 11/29/19    Authorization - Visit Number 6    Authorization - Number of Visits 12    OT Start Time 1100    OT Stop Time 1145    OT Time Calculation (min) 45 min    Activity Tolerance Patient tolerated treatment well    Behavior During Therapy [facility] for tasks assessed/performed           Past Medical History:  Diagnosis Date  . Anemia   . Bronchitis   . Cataracts, bilateral 01/12/2018  . CHF (congestive heart failure) ([place])   . Diabetes mellitus   . High cholesterol   . Hypertension    takes medicine to protect kidneys, does not have HTN  . Leukocytosis   . Morbid obesity ([place]) 09/11/2011   BMI 57  . Neuropathy 01/12/2018  . NICM (nonischemic cardiomyopathy) ([place]) 05/01/2013   Overview:  Last Assessment & Plan:  euvolemic on physical exam.   . Obesity   . Sickle cell trait ([place])   . Thymoma     Past Surgical History:  Procedure Laterality Date  . COLONOSCOPY N/A 06/21/2017   Procedure: COLONOSCOPY;  Surgeon: Ronnette Juniper, MD;  Location: [facility];  Service: Gastroenterology;  Laterality: N/A;  . LEFT HEART CATHETERIZATION WITH CORONARY ANGIOGRAM N/A 05/25/2013   Procedure: LEFT HEART CATHETERIZATION WITH CORONARY ANGIOGRAM;  Surgeon: Troy Sine, MD;  Location: [facility] CATH LAB;  Service: Cardiovascular;  Laterality: N/A;  . LYMPH NODE BIOPSY Right 11/30/2012   Procedure: RIGHT TONSIL BIOPSY WITH FRESH FROZEN ANALYSIS;  Surgeon: Jodi Marble, MD;   Location: [place];  Service: ENT;  Laterality: Right;  . MEDIASTERNOTOMY N/A 01/12/2013   Procedure: MEDIAN STERNOTOMY;  Surgeon: Gaye Pollack, MD;  Location: [place];  Service: Thoracic;  Laterality: N/A;  . MEDIASTINOTOMY CHAMBERLAIN MCNEIL Right 11/06/2012   Procedure: MEDIASTINOTOMY CHAMBERLAIN MCNEILPROCEDURE;  Surgeon: Gaye Pollack, MD;  Location: [place];  Service: Thoracic;  Laterality: Right;  . POLYPECTOMY  06/21/2017   Procedure: POLYPECTOMY;  Surgeon: Ronnette Juniper, MD;  Location: [facility];  Service: Gastroenterology;;  . RESECTION OF A THYMOMA N/A 01/12/2013   Procedure: RESECTION OF A THYMOMA;  Surgeon: Gaye Pollack, MD;  Location: [place];  Service: Thoracic;  Laterality: N/A;  . TONSILLECTOMY    . TUBAL LIGATION      There were no vitals filed for this visit.   Subjective Assessment - 11/20/19 1133    Subjective  Pt had low blood sugar previous to O.T. appointment but feeling better after eating    Pertinent History ICU myopathy s/p covid w/ hypoxemic respiratory failure d/t ARDS in [name] 2021. PMH: CHF, T2DM, sickle cell trait    Limitations monitor vitals prn, BP taken at Lt forearm    Patient Stated Goals increase reaching and endurance in my arms    Currently in Pain? Yes    Pain Score 6     Pain Location Generalized  Pain Descriptors / Indicators Aching    Pain Type Chronic pain    Pain Onset More than a month ago    Pain Frequency Intermittent    Aggravating Factors  a lot of standing    Pain Relieving Factors getting sleep/rest           Pt standing to place medium sized pegs in pegboard alternating UE's by rows, completing 4 rows with 2 rest breaks, then removing 3 at a time alternating hands.  Environmental scanning finding 12/13 items on first pass, missing upper Rt item.  UBE x 8 min, level 5 for UB endurance/strength. Discussed possible d/c by 09/29/19 - pt agreeable. Pt performing all ADLS however requires more rest breaks due to decreased endurance.    O2 sats b/t 93-97 entire session. Glucose levels had returned to normal from previous P.T. session                       OT Short Term Goals - 11/06/19 1138      OT SHORT TERM GOAL #1   Title Independent with BUE A/ROM HEP    Baseline Dependent    Time 3    Period Weeks    Status Achieved      OT SHORT TERM GOAL #2   Title Pt to verbalize understanding with energy conservation techniques and memory strategies    Baseline dependent    Time 3    Period Weeks    Status Achieved      OT SHORT TERM GOAL #3   Title Pt to verbalize understanding with task modifications and potential A/E needs to increase ease with ADLS    Baseline Dependent    Time 3    Period Weeks    Status Achieved             OT Long Term Goals - 11/20/19 1224      OT LONG TERM GOAL #1   Title Pt will attend to Lt side with environmental scanning at 90% accuracy    Baseline has not yet attempted but reports occaisonally missing things on Lt side    Time 6    Period Weeks    Status Achieved      OT LONG TERM GOAL #2   Title Pt to stand for 8 minutes for light IADL tasks w/ no major changes to vitals    Baseline 2-3 minutes    Time 6    Period Weeks    Status New      OT LONG TERM GOAL #3   Title Independent with strengtheing HEP for BUE's    Baseline dependent    Time 6    Period Weeks    Status New      OT LONG TERM GOAL #4   Title Pt to perform repetitive and sustained reaching x 2 minutes each UE for putting away groceries and dishes    Baseline can only do a few reps before fatiguing    Time 6    Period Weeks    Status New                 Plan - 11/20/19 1135    Clinical Impression Statement Pt gradually improving endurance with most rest breaks required during standing activities    OT Occupational Profile and History Detailed Assessment- Review of Records and additional review of physical, cognitive, psychosocial history related to current functional  performance    Occupational performance deficits (  Please refer to evaluation for details): ADL's;IADL's;Leisure;Social Participation    Body Structure / Function / Physical Skills ADL;IADL;ROM;Endurance;Cardiopulmonary status limiting activity;Strength;UE functional use;Pain;Mobility;Decreased knowledge of use of DME;Vision    Cognitive Skills Attention;Memory    Rehab Potential Good    Clinical Decision Making Limited treatment options, no task modification necessary    Comorbidities Affecting Occupational Performance: May have comorbidities impacting occupational performance    Modification or Assistance to Complete Evaluation  No modification of tasks or assist necessary to complete eval    OT Frequency 2x / week    OT Duration 6 weeks   plus eval   OT Treatment/Interventions Self-care/ADL training;Therapeutic exercise;Functional Mobility Training;Neuromuscular education;Therapeutic activities;Coping strategies training;DME and/or AE instruction;Cognitive remediation/compensation;Visual/perceptual remediation/compensation;Passive range of motion;Patient/family education    Plan continue to address LTG's - issue theraband HEP, anticipate d/c by 09/29/19    Consulted and Agree with Plan of Care Patient           Patient will benefit from skilled therapeutic intervention in order to improve the following deficits and impairments:   Body Structure / Function / Physical Skills: ADL, IADL, ROM, Endurance, Cardiopulmonary status limiting activity, Strength, UE functional use, Pain, Mobility, Decreased knowledge of use of DME, Vision Cognitive Skills: Attention, Memory     Visit Diagnosis: Muscle weakness (generalized)  Unsteadiness on feet    Problem List Patient Active Problem List   Diagnosis Date Noted  . Mixed stress and urge urinary incontinence 07/04/2019  . Nerve pain 05/16/2019  . Myofascial pain 05/16/2019  . Subluxation of acromioclavicular joint, right, sequela 03/23/2019    . Sickle cell trait ([place])   . Labile blood glucose   . Diabetic peripheral neuropathy ([place])   . Hypokalemia   . Pain in joint of left shoulder   . Debility   . 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 COVID-19   . History of anemia   . Super obese   . Poorly controlled type 2 diabetes mellitus with peripheral neuropathy ([place])   . Acute on chronic diastolic (congestive) heart failure ([place])   . Hypernatremia   . Shock circulatory ([place])   . Septic shock ([place])   . Hypotension 01/13/2019  . Acute respiratory failure with hypoxemia ([place]) 01/13/2019  . Acute respiratory distress syndrome (ARDS) due to COVID-19 virus ([place]) 01/12/2019  . Grade I diastolic dysfunction 63/87/5643  . 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 Dyspnea on exertion 05/19/2016  . Other fatigue 05/19/2016  . Absolute anemia 04/15/2016  . Left leg weakness 10/18/2014  . Left leg paresthesias 10/18/2014  . Acute left-sided weakness 10/18/2014  . Encounter for central line placement 03/08/2014  . Taking drug for chronic disease 03/08/2014  . H/O insertion of insulin pump 02/18/2014  . Long term current use of insulin ([place]) 02/18/2014  . Insulin pump in place 02/18/2014  . 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 Abnormal ballistocardiogram 05/18/2013  . Abnormal nuclear stress test 05/18/2013  . 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 NICM (nonischemic cardiomyopathy) ([place]) 05/01/2013  . Insulin dependent type 2 diabetes mellitus ([place]) 04/10/2013  . Sex counseling 04/10/2013  . Edema leg 04/10/2013  . Upper respiratory infection, viral 04/10/2013  . Pedal edema 03/29/2013  . Pneumonia 12/12/2012  . Essential hypertension 12/12/2012  . HLD (hyperlipidemia) 12/12/2012  . Tonsillar mass 12/01/2012  . 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 Leukocytosis   . Anemia   . High cholesterol   . Hypertension   . Blurred vision 09/11/2011  .  Foot pain 09/11/2011  . Breast pain 09/11/2011  .  Morbid obesity ([place]) 09/11/2011  . Fungal infection of nail 09/11/2011  . Avitaminosis D 09/11/2011    Carey Bullocks, OTR/L 11/20/2019, 12:24 PM  [facility] [address] [place], [place], [address] Phone: [phone]   Fax:  [phone]  Name: ANESSA CHARLEY MRN: [id number] Date of Birth: 1965-02-01

## 2019-11-20 NOTE — Therapy (Signed)
[facility] [address] [place], [place], [address] Phone: [phone]   Fax:  [phone]  Physical Therapy Treatment - Arrived No Charge  Patient Details  Name: Kaitlin [name] MRN: [id number] Date of Birth: Jul 22, 1965 Referring Provider (PT): Dr. Courtney Heys    Encounter Date: 11/20/2019   PT End of Session - 11/20/19 1046    Visit Number 9    Number of Visits 15    Date for PT Re-Evaluation --   awaiting Medicaid  re-auth   Authorization Type Medicaid - 8 PT visits approved 11/01/19- 11/22/19    Authorization - Visit Number 2   arrived no charge   Authorization - Number of Visits 8    PT Start Time 1015    PT Stop Time 1058    PT Time Calculation (min) 43 min    Activity Tolerance Patient limited by fatigue   limited by low blood sugar   Behavior During Therapy [facility] for tasks assessed/performed           Past Medical History:  Diagnosis Date   Anemia    Bronchitis    Cataracts, bilateral 01/12/2018   CHF (congestive heart failure) ([place])    Diabetes mellitus    High cholesterol    Hypertension    takes medicine to protect kidneys, does not have HTN   Leukocytosis    Morbid obesity ([place]) 09/11/2011   BMI 57   Neuropathy 01/12/2018   NICM (nonischemic cardiomyopathy) ([place]) 05/01/2013   Overview:  Last Assessment & Plan:  euvolemic on physical exam.    Obesity    Sickle cell trait ([place])    Thymoma     Past Surgical History:  Procedure Laterality Date   COLONOSCOPY N/A 06/21/2017   Procedure: COLONOSCOPY;  Surgeon: Ronnette Juniper, MD;  [facility];  Service: Gastroenterology;  Laterality: N/A;   LEFT HEART CATHETERIZATION WITH CORONARY ANGIOGRAM N/A 05/25/2013   Procedure: LEFT HEART CATHETERIZATION WITH CORONARY ANGIOGRAM;  Surgeon: Troy Sine, MD;  Location: [facility] CATH LAB;  Service: Cardiovascular;  Laterality: N/A;   LYMPH NODE BIOPSY Right 11/30/2012   Procedure: RIGHT  TONSIL BIOPSY WITH FRESH FROZEN ANALYSIS;  Surgeon: Jodi Marble, MD;  Location: [facility];  Service: ENT;  Laterality: Right;   MEDIASTERNOTOMY N/A 01/12/2013   Procedure: MEDIAN STERNOTOMY;  Surgeon: Gaye Pollack, MD;  Location: [facility];  Service: Thoracic;  Laterality: N/A;   MEDIASTINOTOMY CHAMBERLAIN MCNEIL Right 11/06/2012   Procedure: MEDIASTINOTOMY CHAMBERLAIN MCNEILPROCEDURE;  Surgeon: Gaye Pollack, MD;  Location: [place];  Service: Thoracic;  Laterality: Right;   POLYPECTOMY  06/21/2017   Procedure: POLYPECTOMY;  Surgeon: Ronnette Juniper, MD;  [facility];  Service: Gastroenterology;;   RESECTION OF A THYMOMA N/A 01/12/2013   Procedure: RESECTION OF A THYMOMA;  Surgeon: Gaye Pollack, MD;  [facility];  Service: Thoracic;  Laterality: N/A;   TONSILLECTOMY     TUBAL LIGATION      There were no vitals filed for this visit.   Subjective Assessment - 11/20/19 1016    Subjective Feeling sore today from PT last week. Got some injections from Dr. Dagoberto Ligas yesterday to help with the pain in her neck. Still having to constantly think about the effort to walk.    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 2-3 minutes    How long can you walk  comfortably? 150' approx. back into therapy gym    Patient Stated Goals wants to be able to walk normal and not feel like she is going to topple over. standing up is difficult    Currently in Pain? Yes    Pain Score 6     Pain Location Generalized    Pain Orientation --   everywhere   Pain Descriptors / Indicators Aching    Pain Type Chronic pain    Aggravating Factors  a lot of standing    Pain Relieving Factors getting some sleep                             [facility] Adult PT Treatment/Exercise - 11/20/19 1025      Knee/Hip Exercises: Aerobic   Other Aerobic SciFit at level 2.0 with BUE and BLE for  strengthening and activity tolerance x3 minutes 30 seconds - pt reporting feeling nauseous and feeling like her blood sugar was too low. Stopped and provided pt with crackers and water. HR after: 100 bpm O2 after: 96%            Provided pt with crackers and soda after SciFit due to pt reporting she felt like she had low blood sugar and was feeling nauseous. Pt able to check her blood sugar at 85mg /dL. Pt reporting no longer feeling naseous, but blood sugar went down in the 70s. Provided pt with a little bit more soda. Pt states that they incr her ozempic a couple months ago and states that she has had incr episodes of decr blood sugar. Discussed with pt that since pt has limited medicaid visits that today will be an arrived no charge. HR and O2 sats staying stable throughout. At end of time with PT, blood sugar staying stable at 73 mg/DL.      Past Medical History:  Diagnosis Date   Anemia    Bronchitis    Cataracts, bilateral 01/12/2018   CHF (congestive heart failure) ([facility])    Diabetes mellitus    High cholesterol    Hypertension    takes medicine to protect kidneys, does not have HTN   Leukocytosis    Morbid obesity ([place]) 09/11/2011   BMI 57   Neuropathy 01/12/2018   NICM (nonischemic cardiomyopathy) ([place]) 05/01/2013   Overview:  Last Assessment & Plan:  euvolemic on physical exam.    Obesity    Sickle cell trait ([place])    Thymoma            PT Short Term Goals - 10/19/19 1235      PT SHORT TERM GOAL #1   Title Pt will be independent with initial HEP in order to build upon functional gains made in therapy. ALL STGS DUE AFTER 6 VISITS.    Baseline has not been performing consistently lately due to not feeling well.    Time 6   visits   Status On-going      PT SHORT TERM GOAL #2   Title Pt will undergo BERG/DGI when pt is able to tolerate and LTG written as appropriate.    Baseline DGI assessed on 10/19/19 - 22/24    Time 6    Period --   visits   Status  Achieved      PT SHORT TERM GOAL #3   Title Pt will ambulate at least 150' with RPE rating at 6-7/10 or less with supervision in order to improve functional mobility.  Baseline 230' with RPE at 5/10 afterwards.    Time 6   visits   Status Achieved      PT SHORT TERM GOAL #4   Title Pt will perform 5x sit <> stand wit BUE support in 24 seconds or less in order to demo improved BLE strength.    Baseline 27.87 seconds from 21" mat table, without UE support: 14.12 seconds on 10/19/19    Time 6   visits   Status Achieved             PT Long Term Goals - 11/08/19 1154      PT LONG TERM GOAL #1   Title Pt will be independent with final HEP in order to build upon functional gains made in therapy. ALL LTGS DUE AFTER 15 VISITS.    Baseline pt has not been performing at home.    Time 15   visits   Status New      PT LONG TERM GOAL #2   Title Pt will improve 3MWT distance to at least 340' in order to demo improved walking endurance.    Baseline 295' on 11/08/19    Time 15   visits   Status Revised      PT LONG TERM GOAL #3   Title Pt will ambulate at least 300' over indoor surfaces with RPE rating at 3-4/10 or less with supervision in order to improve functional mobility.    Baseline 5-6/10 RPE after 230' indoors    Time 15   visits   Status Revised      PT LONG TERM GOAL #4   Title Pt will improve 30 second chair stand to at least 11 sit <> stands from standard height mat table to demo improved functional BLE strength.    Baseline 10 sit <> stands from 21" mat table    Time 15   visits   Status Revised      PT LONG TERM GOAL #5   Title Pt will improve gait speed to at least 2.8 ft/sec in order to demo improved community mobility.    Baseline 2.44 ft/sec    Time 15   visits   Status New                 Plan - 11/20/19 1410    Clinical Impression Statement Today's session limited due to pt reporting feeling naseuous and reporting having a low blood sugar (70-80s - pt  able to check her blood sugar because of insulin pump). Gave pt crackers and soda with pt's blood sugar improving and pt feeling better to participate in OT session. Did arrived no charge for this visit.    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, CHF, diabetes mellitus, HTN, neuropathy, obesity    Examination-Activity Limitations Stand;Sit;Transfers;Stairs;Squat;Locomotion Level    Examination-Participation Restrictions Church;Community Activity    Stability/Clinical Decision Making Evolving/Moderate complexity    Rehab Potential Good    PT Frequency 2x / week    PT Duration 4 weeks    PT Treatment/Interventions ADLs/Self Care Home Management;DME Instruction;Gait training;Stair training;Functional mobility training;Therapeutic activities;Balance training;Therapeutic exercise;Neuromuscular re-education;Patient/family education;Energy conservation    PT Next Visit Plan medicaid auth ends 11/11  - check goals and ask for date extension as pt has not used all of her visits this auth period. seated and standing functional strengthening,  standing balance. walking endurance.    PT Home Exercise  Plan [facility]    Consulted and Agree with Plan of Care Patient           Patient will benefit from skilled therapeutic intervention in order to improve the following deficits and impairments:  Abnormal gait, Cardiopulmonary status limiting activity, Decreased activity tolerance, Decreased balance, Decreased coordination, Decreased endurance, Decreased mobility, Difficulty walking, Decreased strength, Impaired sensation, Postural dysfunction, Obesity  Visit Diagnosis: Muscle weakness (generalized)  Unsteadiness on feet  Difficulty in walking, not elsewhere classified  Other abnormalities of gait and mobility     Problem List Patient Active Problem List    Diagnosis Date Noted   Mixed stress and urge urinary incontinence 07/04/2019   Nerve pain 05/16/2019   Myofascial pain 05/16/2019   Subluxation of acromioclavicular joint, right, sequela 03/23/2019   Sickle cell trait ([facility])    Labile blood glucose    Diabetic peripheral neuropathy ([facility])    Hypokalemia    Pain in joint of left shoulder    Debility    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   COVID-19    History of anemia    Super obese    Poorly controlled type 2 diabetes mellitus with peripheral neuropathy ([facility])    Acute on chronic diastolic (congestive) heart failure ([facility])    Hypernatremia    Shock circulatory (HCC)    Septic shock (HCC)    Hypotension 01/13/2019   Acute respiratory failure with hypoxemia ([place]) 01/13/2019   Acute respiratory distress syndrome (ARDS) due to COVID-19 virus ([place]) 01/12/2019   Grade I diastolic dysfunction 09/81/1914   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   Dyspnea on exertion 05/19/2016   Other fatigue 05/19/2016   Absolute anemia 04/15/2016   Left leg weakness 10/18/2014   Left leg paresthesias 10/18/2014   Acute left-sided weakness 10/18/2014   Encounter for central line placement 03/08/2014   Taking drug for chronic disease 03/08/2014   H/O insertion of insulin pump 02/18/2014   Long term current use of insulin ([place]) 02/18/2014   Insulin pump in place 02/18/2014   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   Abnormal ballistocardiogram 05/18/2013   Abnormal nuclear stress test 05/18/2013   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   NICM (nonischemic cardiomyopathy) ([place]) 05/01/2013   Insulin dependent type 2 diabetes mellitus ([place]) 04/10/2013   Sex counseling 04/10/2013   Edema leg 04/10/2013   Upper respiratory infection, viral 04/10/2013   Pedal edema 03/29/2013   Pneumonia 12/12/2012   Essential hypertension 12/12/2012   HLD (hyperlipidemia) 12/12/2012   Tonsillar mass 12/01/2012   Thymoma  11/29/2012   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

## 2019-11-22 ENCOUNTER — Ambulatory Visit: Payer: Medicaid Other | Admitting: Physical Therapy

## 2019-11-22 ENCOUNTER — Ambulatory Visit: Payer: Medicaid Other | Admitting: Occupational Therapy

## 2019-11-27 ENCOUNTER — Other Ambulatory Visit: Payer: Self-pay

## 2019-11-27 ENCOUNTER — Ambulatory Visit: Payer: Medicaid Other | Admitting: Occupational Therapy

## 2019-11-27 DIAGNOSIS — M6281 Muscle weakness (generalized): Secondary | ICD-10-CM | POA: Diagnosis not present

## 2019-11-27 DIAGNOSIS — R2681 Unsteadiness on feet: Secondary | ICD-10-CM

## 2019-11-27 NOTE — Patient Instructions (Signed)
    Strengthening: Resisted Flexion   Hold tubing with __Rt, then Lt___ arm(s) at side. Pull forward and up. Move shoulder through pain-free range of motion. Repeat __10__ times per set.  Do _1-2_ sessions per day , every other day   Strengthening: Resisted Extension   Hold tubing in __both___ hands simultaneously, arms forward. Pull arms back evenly, elbows straight. Repeat _10___ times per set. Do _1-2___ sessions per day, every other day.  Scapular Retraction: Rowing (Eccentric) - Arms - 45 Degrees (Resistance Band)    Hold end of band in each hand. Pull back until elbows are even with trunk. Keep elbows out from sides at 45, thumbs up. Slowly release for 3-5 seconds. Use ___yellow_____ resistance band. 10___ reps per set, __1-2_ sets per day, every other day    Resisted Horizontal Abduction: Bilateral   Sit or stand, tubing in both hands, arms out in front. Keeping arms straight, pinch shoulder blades together and stretch arms out. Repeat _10___ times per set. Do _1-2___ sessions per day, every other day.   Elbow Flexion: Resisted   With tubing held in __both____ hands and other end secured under foot, curl arms up bending at elbow Repeat _10___ times per set. Do _1-2___ sessions per day, every other day.    Elbow Extension: Resisted   Sit in chair with resistive band secured opposite shoulder and __Rt_____ elbow bent. Straighten Rt elbow. Repeat _10___ times per set.  Repeat to other side. Do _1-2___ sessions per day, every other day.

## 2019-11-27 NOTE — Therapy (Signed)
[facility] [address] [place], [place], [address] Phone: [phone]   Fax:  [phone]  Occupational Therapy Treatment  Patient Details  Name: Kaitlin [name] MRN: [id number] Date of Birth: 28-Dec-1965 No data recorded  Encounter Date: 11/27/2019   OT End of Session - 11/27/19 1150    Visit Number 8    Number of Visits 13    Date for OT Re-Evaluation 11/16/19    Authorization Type [employer] -    Authorization Time Period approved 12 visits 10/11 - 11/29/19    Authorization - Visit Number 7    Authorization - Number of Visits 12    OT Start Time 9381    OT Stop Time 1145    OT Time Calculation (min) 40 min    Activity Tolerance Patient tolerated treatment well    Behavior During Therapy [facility] for tasks assessed/performed           Past Medical History:  Diagnosis Date  . Anemia   . Bronchitis   . Cataracts, bilateral 01/12/2018  . CHF (congestive heart failure) ([place])   . Diabetes mellitus   . High cholesterol   . Hypertension    takes medicine to protect kidneys, does not have HTN  . Leukocytosis   . Morbid obesity ([place]) 09/11/2011   BMI 57  . Neuropathy 01/12/2018  . NICM (nonischemic cardiomyopathy) ([place]) 05/01/2013   Overview:  Last Assessment & Plan:  euvolemic on physical exam.   . Obesity   . Sickle cell trait ([place])   . Thymoma     Past Surgical History:  Procedure Laterality Date  . COLONOSCOPY N/A 06/21/2017   Procedure: COLONOSCOPY;  Surgeon: Ronnette Juniper, MD;  Location: [facility];  Service: Gastroenterology;  Laterality: N/A;  . LEFT HEART CATHETERIZATION WITH CORONARY ANGIOGRAM N/A 05/25/2013   Procedure: LEFT HEART CATHETERIZATION WITH CORONARY ANGIOGRAM;  Surgeon: Troy Sine, MD;  Location: [facility] CATH LAB;  Service: Cardiovascular;  Laterality: N/A;  . LYMPH NODE BIOPSY Right 11/30/2012   Procedure: RIGHT TONSIL BIOPSY WITH FRESH FROZEN ANALYSIS;  Surgeon: Jodi Marble, MD;   Location: [place];  Service: ENT;  Laterality: Right;  . MEDIASTERNOTOMY N/A 01/12/2013   Procedure: MEDIAN STERNOTOMY;  Surgeon: Gaye Pollack, MD;  Location: [place];  Service: Thoracic;  Laterality: N/A;  . MEDIASTINOTOMY CHAMBERLAIN MCNEIL Right 11/06/2012   Procedure: MEDIASTINOTOMY CHAMBERLAIN MCNEILPROCEDURE;  Surgeon: Gaye Pollack, MD;  Location: [place];  Service: Thoracic;  Laterality: Right;  . POLYPECTOMY  06/21/2017   Procedure: POLYPECTOMY;  Surgeon: Ronnette Juniper, MD;  Location: [facility];  Service: Gastroenterology;;  . RESECTION OF A THYMOMA N/A 01/12/2013   Procedure: RESECTION OF A THYMOMA;  Surgeon: Gaye Pollack, MD;  Location: [place];  Service: Thoracic;  Laterality: N/A;  . TONSILLECTOMY    . TUBAL LIGATION      There were no vitals filed for this visit.   Subjective Assessment - 11/27/19 1117    Pertinent History ICU myopathy s/p covid w/ hypoxemic respiratory failure d/t ARDS in [name] 2021. PMH: CHF, T2DM, sickle cell trait    Limitations monitor vitals prn, BP taken at Lt forearm    Patient Stated Goals increase reaching and endurance in my arms    Currently in Pain? Yes    Pain Score 6     Pain Location Generalized    Pain Orientation --   everywhere   Pain Descriptors / Indicators Aching    Pain  Type Chronic pain    Pain Onset More than a month ago    Pain Frequency Intermittent    Aggravating Factors  a lot of standing    Pain Relieving Factors getting sleep/rest           Pt issued theraband HEP today and demo each x 10 reps with initial cueing required for correct positioning. UBE x 8 min, level 3 O2 sats b/t 95-98%, HR went up to 120 at one point after standing for certain theraband ex's                     OT Education - 11/27/19 1129    Education Details Theraband HEP    Person(s) Educated Patient    Methods Explanation;Handout;Demonstration    Comprehension Verbalized understanding;Returned demonstration            OT  Short Term Goals - 11/06/19 1138      OT SHORT TERM GOAL #1   Title Independent with BUE A/ROM HEP    Baseline Dependent    Time 3    Period Weeks    Status Achieved      OT SHORT TERM GOAL #2   Title Pt to verbalize understanding with energy conservation techniques and memory strategies    Baseline dependent    Time 3    Period Weeks    Status Achieved      OT SHORT TERM GOAL #3   Title Pt to verbalize understanding with task modifications and potential A/E needs to increase ease with ADLS    Baseline Dependent    Time 3    Period Weeks    Status Achieved             OT Long Term Goals - 11/27/19 1151      OT LONG TERM GOAL #1   Title Pt will attend to Lt side with environmental scanning at 90% accuracy    Baseline has not yet attempted but reports occaisonally missing things on Lt side    Time 6    Period Weeks    Status Achieved      OT LONG TERM GOAL #2   Title Pt to stand for 8 minutes for light IADL tasks w/ no major changes to vitals    Baseline 2-3 minutes    Time 6    Period Weeks    Status Not Met      OT LONG TERM GOAL #3   Title Independent with strengtheing HEP for BUE's    Baseline dependent    Time 6    Period Weeks    Status On-going      OT LONG TERM GOAL #4   Title Pt to perform repetitive and sustained reaching x 2 minutes each UE for putting away groceries and dishes    Baseline can only do a few reps before fatiguing    Time 6    Period Weeks    Status Achieved                 Plan - 11/27/19 1151    Clinical Impression Statement Pt gradually improving endurance with most rest breaks required during standing activities    OT Occupational Profile and History Detailed Assessment- Review of Records and additional review of physical, cognitive, psychosocial history related to current functional performance    Occupational performance deficits (Please refer to evaluation for details): ADL's;IADL's;Leisure;Social Participation     Body Structure / Function / Physical  Skills ADL;IADL;ROM;Endurance;Cardiopulmonary status limiting activity;Strength;UE functional use;Pain;Mobility;Decreased knowledge of use of DME;Vision    Cognitive Skills Attention;Memory    Rehab Potential Good    Clinical Decision Making Limited treatment options, no task modification necessary    Comorbidities Affecting Occupational Performance: May have comorbidities impacting occupational performance    Modification or Assistance to Complete Evaluation  No modification of tasks or assist necessary to complete eval    OT Frequency 2x / week    OT Duration 6 weeks   plus eval   OT Treatment/Interventions Self-care/ADL training;Therapeutic exercise;Functional Mobility Training;Neuromuscular education;Therapeutic activities;Coping strategies training;DME and/or AE instruction;Cognitive remediation/compensation;Visual/perceptual remediation/compensation;Passive range of motion;Patient/family education    Plan review theraband HEP, time pt's standing tolerance for LTG #2, UBE, d/c next session    Consulted and Agree with Plan of Care Patient           Patient will benefit from skilled therapeutic intervention in order to improve the following deficits and impairments:   Body Structure / Function / Physical Skills: ADL, IADL, ROM, Endurance, Cardiopulmonary status limiting activity, Strength, UE functional use, Pain, Mobility, Decreased knowledge of use of DME, Vision Cognitive Skills: Attention, Memory     Visit Diagnosis: Muscle weakness (generalized)  Unsteadiness on feet    Problem List Patient Active Problem List   Diagnosis Date Noted  . Mixed stress and urge urinary incontinence 07/04/2019  . Nerve pain 05/16/2019  . Myofascial pain 05/16/2019  . Subluxation of acromioclavicular joint, right, sequela 03/23/2019  . Sickle cell trait ([place])   . Labile blood glucose   . Diabetic peripheral neuropathy ([place])   . Hypokalemia   . Pain in  joint of left shoulder   . Debility   . 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 COVID-19   . History of anemia   . Super obese   . Poorly controlled type 2 diabetes mellitus with peripheral neuropathy ([place])   . Acute on chronic diastolic (congestive) heart failure ([place])   . Hypernatremia   . Shock circulatory ([place])   . Septic shock ([place])   . Hypotension 01/13/2019  . Acute respiratory failure with hypoxemia ([place]) 01/13/2019  . Acute respiratory distress syndrome (ARDS) due to COVID-19 virus ([place]) 01/12/2019  . Grade I diastolic dysfunction 12/75/1700  . 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 Dyspnea on exertion 05/19/2016  . Other fatigue 05/19/2016  . Absolute anemia 04/15/2016  . Left leg weakness 10/18/2014  . Left leg paresthesias 10/18/2014  . Acute left-sided weakness 10/18/2014  . Encounter for central line placement 03/08/2014  . Taking drug for chronic disease 03/08/2014  . H/O insertion of insulin pump 02/18/2014  . Long term current use of insulin ([place]) 02/18/2014  . Insulin pump in place 02/18/2014  . 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 Abnormal ballistocardiogram 05/18/2013  . Abnormal nuclear stress test 05/18/2013  . 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 NICM (nonischemic cardiomyopathy) ([place]) 05/01/2013  . Insulin dependent type 2 diabetes mellitus ([place]) 04/10/2013  . Sex counseling 04/10/2013  . Edema leg 04/10/2013  . Upper respiratory infection, viral 04/10/2013  . Pedal edema 03/29/2013  . Pneumonia 12/12/2012  . Essential hypertension 12/12/2012  . HLD (hyperlipidemia) 12/12/2012  . Tonsillar mass 12/01/2012  . 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 Leukocytosis   . Anemia   . High cholesterol   . Hypertension   . Blurred vision 09/11/2011  . Foot pain 09/11/2011  . Breast pain 09/11/2011  . Morbid obesity ([place]) 09/11/2011  .  Fungal infection of nail 09/11/2011  . Avitaminosis D 09/11/2011    Carey Bullocks, OTR/L 11/27/2019, 11:53 AM  [facility] [address] [place], [place], [address] Phone: [phone]   Fax:  [phone]  Name: TAMEYAH KOCH MRN: [id number] Date of Birth: 1965/06/23

## 2019-11-29 ENCOUNTER — Ambulatory Visit: Payer: Medicaid Other | Admitting: Occupational Therapy

## 2019-11-29 ENCOUNTER — Other Ambulatory Visit: Payer: Self-pay

## 2019-11-29 DIAGNOSIS — M6281 Muscle weakness (generalized): Secondary | ICD-10-CM

## 2019-11-29 DIAGNOSIS — R2681 Unsteadiness on feet: Secondary | ICD-10-CM

## 2019-11-29 NOTE — Therapy (Signed)
[facility] [address] [place], [place], [address] Phone: [phone]   Fax:  [phone]  Occupational Therapy Treatment  Patient Details  Name: Kaitlin [name] MRN: [id number] Date of Birth: 1965/01/15 No data recorded  Encounter Date: 11/29/2019   OT End of Session - 11/29/19 1147    Visit Number 9    Number of Visits 13    Date for OT Re-Evaluation 11/16/19    Authorization Type [employer] -    Authorization Time Period approved 12 visits 10/11 - 11/29/19    Authorization - Visit Number 8    Authorization - Number of Visits 12    OT Start Time 1103    OT Stop Time 1145    OT Time Calculation (min) 42 min    Activity Tolerance Patient tolerated treatment well    Behavior During Therapy [facility] for tasks assessed/performed           Past Medical History:  Diagnosis Date  . Anemia   . Bronchitis   . Cataracts, bilateral 01/12/2018  . CHF (congestive heart failure) ([place])   . Diabetes mellitus   . High cholesterol   . Hypertension    takes medicine to protect kidneys, does not have HTN  . Leukocytosis   . Morbid obesity ([place]) 09/11/2011   BMI 57  . Neuropathy 01/12/2018  . NICM (nonischemic cardiomyopathy) ([place]) 05/01/2013   Overview:  Last Assessment & Plan:  euvolemic on physical exam.   . Obesity   . Sickle cell trait ([place])   . Thymoma     Past Surgical History:  Procedure Laterality Date  . COLONOSCOPY N/A 06/21/2017   Procedure: COLONOSCOPY;  Surgeon: Ronnette Juniper, MD;  Location: [facility];  Service: Gastroenterology;  Laterality: N/A;  . LEFT HEART CATHETERIZATION WITH CORONARY ANGIOGRAM N/A 05/25/2013   Procedure: LEFT HEART CATHETERIZATION WITH CORONARY ANGIOGRAM;  Surgeon: Troy Sine, MD;  Location: [facility] CATH LAB;  Service: Cardiovascular;  Laterality: N/A;  . LYMPH NODE BIOPSY Right 11/30/2012   Procedure: RIGHT TONSIL BIOPSY WITH FRESH FROZEN ANALYSIS;  Surgeon: Jodi Marble, MD;   Location: [place];  Service: ENT;  Laterality: Right;  . MEDIASTERNOTOMY N/A 01/12/2013   Procedure: MEDIAN STERNOTOMY;  Surgeon: Gaye Pollack, MD;  Location: [facility];  Service: Thoracic;  Laterality: N/A;  . MEDIASTINOTOMY CHAMBERLAIN MCNEIL Right 11/06/2012   Procedure: MEDIASTINOTOMY CHAMBERLAIN MCNEILPROCEDURE;  Surgeon: Gaye Pollack, MD;  Location: [facility];  Service: Thoracic;  Laterality: Right;  . POLYPECTOMY  06/21/2017   Procedure: POLYPECTOMY;  Surgeon: Ronnette Juniper, MD;  Location: [facility];  Service: Gastroenterology;;  . RESECTION OF A THYMOMA N/A 01/12/2013   Procedure: RESECTION OF A THYMOMA;  Surgeon: Gaye Pollack, MD;  Location: [facility];  Service: Thoracic;  Laterality: N/A;  . TONSILLECTOMY    . TUBAL LIGATION      There were no vitals filed for this visit.   Subjective Assessment - 11/29/19 1110    Subjective  Just the normal pain    Pertinent History ICU myopathy s/p covid w/ hypoxemic respiratory failure d/t ARDS in [name] 2021. PMH: CHF, T2DM, sickle cell trait    Limitations monitor vitals prn, BP taken at Lt forearm    Patient Stated Goals increase reaching and endurance in my arms    Currently in Pain? Yes    Pain Score 5     Pain Location Generalized    Pain Orientation --   everywhere  Pain Descriptors / Indicators Aching    Pain Type Chronic pain    Pain Onset More than a month ago    Pain Frequency Intermittent    Aggravating Factors  a lot of standing    Pain Relieving Factors getting sleep/rest           Reviewed theraband HEP and therapist demo. UBE x 8 min, level 3 resistance for UE strength and conditioning.  Standing up to 3 min. 15 sec to retrieve and replace items on mid level shelf w/ LUE, then with RUE before sitting. Pt did x 2. Pt then simulated sweeping but only could do 1.5 minutes before requiring rest break. Discussed progress towards goals and how to continue to monitor progress at home with day to day tasks and IADLS.                         OT Short Term Goals - 11/06/19 1138      OT SHORT TERM GOAL #1   Title Independent with BUE A/ROM HEP    Baseline Dependent    Time 3    Period Weeks    Status Achieved      OT SHORT TERM GOAL #2   Title Pt to verbalize understanding with energy conservation techniques and memory strategies    Baseline dependent    Time 3    Period Weeks    Status Achieved      OT SHORT TERM GOAL #3   Title Pt to verbalize understanding with task modifications and potential A/E needs to increase ease with ADLS    Baseline Dependent    Time 3    Period Weeks    Status Achieved             OT Long Term Goals - 11/29/19 1147      OT LONG TERM GOAL #1   Title Pt will attend to Lt side with environmental scanning at 90% accuracy    Baseline has not yet attempted but reports occaisonally missing things on Lt side    Time 6    Period Weeks    Status Achieved      OT LONG TERM GOAL #2   Title Pt to stand for 8 minutes for light IADL tasks w/ no major changes to vitals    Baseline 2-3 minutes    Time 6    Period Weeks    Status Not Met   up to 3 minutes, 15 sec     OT LONG TERM GOAL #3   Title Independent with strengtheing HEP for BUE's    Baseline dependent    Time 6    Period Weeks    Status Achieved      OT LONG TERM GOAL #4   Title Pt to perform repetitive and sustained reaching x 2 minutes each UE for putting away groceries and dishes    Baseline can only do a few reps before fatiguing    Time 6    Period Weeks    Status Achieved                 Plan - 11/29/19 1207    Clinical Impression Statement Pt has met all STG's and all LTG's except LTG #2. Pt only standing up to 3 min. 15 sec. before needing to sit.    OT Occupational Profile and History Detailed Assessment- Review of Records and additional review of physical, cognitive, psychosocial history related  to current functional performance    Occupational performance  deficits (Please refer to evaluation for details): ADL's;IADL's;Leisure;Social Participation    Body Structure / Function / Physical Skills ADL;IADL;ROM;Endurance;Cardiopulmonary status limiting activity;Strength;UE functional use;Pain;Mobility;Decreased knowledge of use of DME;Vision    Cognitive Skills Attention;Memory    Rehab Potential Good    Clinical Decision Making Limited treatment options, no task modification necessary    Comorbidities Affecting Occupational Performance: May have comorbidities impacting occupational performance    Modification or Assistance to Complete Evaluation  No modification of tasks or assist necessary to complete eval    OT Frequency 2x / week    OT Duration 6 weeks   plus eval   OT Treatment/Interventions Self-care/ADL training;Therapeutic exercise;Functional Mobility Training;Neuromuscular education;Therapeutic activities;Coping strategies training;DME and/or AE instruction;Cognitive remediation/compensation;Visual/perceptual remediation/compensation;Passive range of motion;Patient/family education    Plan D/C O.Darene Lamer    Consulted and Agree with Plan of Care Patient           Patient will benefit from skilled therapeutic intervention in order to improve the following deficits and impairments:   Body Structure / Function / Physical Skills: ADL, IADL, ROM, Endurance, Cardiopulmonary status limiting activity, Strength, UE functional use, Pain, Mobility, Decreased knowledge of use of DME, Vision Cognitive Skills: Attention, Memory     Visit Diagnosis: Muscle weakness (generalized)  Unsteadiness on feet    Problem List Patient Active Problem List   Diagnosis Date Noted  . Mixed stress and urge urinary incontinence 07/04/2019  . Nerve pain 05/16/2019  . Myofascial pain 05/16/2019  . Subluxation of acromioclavicular joint, right, sequela 03/23/2019  . Sickle cell trait ([place])   . Labile blood glucose   . Diabetic peripheral neuropathy ([place])   .  Hypokalemia   . Pain in joint of left shoulder   . Debility   . 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 COVID-19   . History of anemia   . Super obese   . Poorly controlled type 2 diabetes mellitus with peripheral neuropathy ([place])   . Acute on chronic diastolic (congestive) heart failure ([place])   . Hypernatremia   . Shock circulatory ([place])   . Septic shock ([place])   . Hypotension 01/13/2019  . Acute respiratory failure with hypoxemia ([place]) 01/13/2019  . Acute respiratory distress syndrome (ARDS) due to COVID-19 virus ([place]) 01/12/2019  . Grade I diastolic dysfunction 37/62/8315  . 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 Dyspnea on exertion 05/19/2016  . Other fatigue 05/19/2016  . Absolute anemia 04/15/2016  . Left leg weakness 10/18/2014  . Left leg paresthesias 10/18/2014  . Acute left-sided weakness 10/18/2014  . Encounter for central line placement 03/08/2014  . Taking drug for chronic disease 03/08/2014  . H/O insertion of insulin pump 02/18/2014  . Long term current use of insulin ([place]) 02/18/2014  . Insulin pump in place 02/18/2014  . 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 Abnormal ballistocardiogram 05/18/2013  . Abnormal nuclear stress test 05/18/2013  . 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 NICM (nonischemic cardiomyopathy) ([place]) 05/01/2013  . Insulin dependent type 2 diabetes mellitus ([place]) 04/10/2013  . Sex counseling 04/10/2013  . Edema leg 04/10/2013  . Upper respiratory infection, viral 04/10/2013  . Pedal edema 03/29/2013  . Pneumonia 12/12/2012  . Essential hypertension 12/12/2012  . HLD (hyperlipidemia) 12/12/2012  . Tonsillar mass 12/01/2012  . 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 Leukocytosis   . Anemia   . High cholesterol   . Hypertension   . Blurred vision 09/11/2011  .  Foot pain 09/11/2011  . Breast pain 09/11/2011  . Morbid obesity ([place]) 09/11/2011  . Fungal infection of nail 09/11/2011  . Avitaminosis D 09/11/2011     OCCUPATIONAL THERAPY DISCHARGE SUMMARY  Visits from Start of Care: 9  Current functional level related to goals / functional outcomes: SEE ABOVE   Remaining deficits: Standing tolerance Endurance   Education / Equipment: HEP's, energy conservation techniques  Plan: Patient agrees to discharge.  Patient goals were partially met. Patient is being discharged due to meeting the stated rehab goals.  ?????        Carey Bullocks, OTR/L 11/29/2019, 12:33 PM  [facility] [address] [place], [place], [address] Phone: [phone]   Fax:  [phone]  Name: Kaitlin [name] MRN: [id number] Date of Birth: 1965/03/18

## 2019-12-16 ENCOUNTER — Other Ambulatory Visit: Payer: Self-pay | Admitting: Physical Medicine and Rehabilitation

## 2019-12-19 ENCOUNTER — Other Ambulatory Visit: Payer: Self-pay

## 2019-12-19 ENCOUNTER — Encounter: Payer: Self-pay | Admitting: Physical Therapy

## 2019-12-19 ENCOUNTER — Ambulatory Visit: Payer: Medicaid Other | Attending: Physical Medicine and Rehabilitation | Admitting: Physical Therapy

## 2019-12-19 DIAGNOSIS — R2689 Other abnormalities of gait and mobility: Secondary | ICD-10-CM | POA: Diagnosis present

## 2019-12-19 DIAGNOSIS — R2681 Unsteadiness on feet: Secondary | ICD-10-CM

## 2019-12-19 DIAGNOSIS — M6281 Muscle weakness (generalized): Secondary | ICD-10-CM

## 2019-12-19 DIAGNOSIS — R262 Difficulty in walking, not elsewhere classified: Secondary | ICD-10-CM | POA: Insufficient documentation

## 2019-12-19 NOTE — Therapy (Signed)
[facility] [address] [place], [place], [address] Phone: [phone]   Fax:  [phone]  Physical Therapy Treatment  Patient Details  Name: Kaitlin [name] MRN: [id number] Date of Birth: 03-11-1965 Referring Provider (PT): Dr. Courtney Heys    Encounter Date: 12/19/2019   PT End of Session - 12/19/19 1730    Visit Number 10    Number of Visits 15    Date for PT Re-Evaluation --   awaiting Medicaid  re-auth   Authorization Type Medicaid - 6 PT visits approved 12/19/19 - 01/02/20    Authorization - Visit Number 1   arrived no charge   Authorization - Number of Visits 6    PT Start Time 5188    PT Stop Time 1357    PT Time Calculation (min) 40 min    Activity Tolerance Patient limited by fatigue;Patient tolerated treatment well    Behavior During Therapy [facility] for tasks assessed/performed           Past Medical History:  Diagnosis Date  . Anemia   . Bronchitis   . Cataracts, bilateral 01/12/2018  . CHF (congestive heart failure) ([place])   . Diabetes mellitus   . High cholesterol   . Hypertension    takes medicine to protect kidneys, does not have HTN  . Leukocytosis   . Morbid obesity ([place]) 09/11/2011   BMI 57  . Neuropathy 01/12/2018  . NICM (nonischemic cardiomyopathy) ([place]) 05/01/2013   Overview:  Last Assessment & Plan:  euvolemic on physical exam.   . Obesity   . Sickle cell trait ([place])   . Thymoma     Past Surgical History:  Procedure Laterality Date  . COLONOSCOPY N/A 06/21/2017   Procedure: COLONOSCOPY;  Surgeon: Ronnette Juniper, MD;  Location: [facility];  Service: Gastroenterology;  Laterality: N/A;  . LEFT HEART CATHETERIZATION WITH CORONARY ANGIOGRAM N/A 05/25/2013   Procedure: LEFT HEART CATHETERIZATION WITH CORONARY ANGIOGRAM;  Surgeon: Troy Sine, MD;  Location: [facility] CATH LAB;  Service: Cardiovascular;  Laterality: N/A;  . LYMPH NODE BIOPSY Right 11/30/2012   Procedure: RIGHT TONSIL BIOPSY  WITH FRESH FROZEN ANALYSIS;  Surgeon: Jodi Marble, MD;  Location: [facility];  Service: ENT;  Laterality: Right;  . MEDIASTERNOTOMY N/A 01/12/2013   Procedure: MEDIAN STERNOTOMY;  Surgeon: Gaye Pollack, MD;  Location: [facility];  Service: Thoracic;  Laterality: N/A;  . MEDIASTINOTOMY CHAMBERLAIN MCNEIL Right 11/06/2012   Procedure: MEDIASTINOTOMY CHAMBERLAIN MCNEILPROCEDURE;  Surgeon: Gaye Pollack, MD;  Location: [place];  Service: Thoracic;  Laterality: Right;  . POLYPECTOMY  06/21/2017   Procedure: POLYPECTOMY;  Surgeon: Ronnette Juniper, MD;  Location: [facility];  Service: Gastroenterology;;  . RESECTION OF A THYMOMA N/A 01/12/2013   Procedure: RESECTION OF A THYMOMA;  Surgeon: Gaye Pollack, MD;  Location: [place] [facility];  Service: Thoracic;  Laterality: N/A;  . TONSILLECTOMY    . TUBAL LIGATION      There were no vitals filed for this visit.   Subjective Assessment - 12/19/19 1319    Subjective Has been having some family issues going on. Still having decreased endurance. Trying to work more on standing/walking at home. Got her booster shot in the L arm yesterday and has been feeling a little bit sore.    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 2-3 minutes    How long can you walk comfortably?  150' approx. back into therapy gym    Patient Stated Goals wants to be able to walk normal and not feel like she is going to topple over. standing up is difficult    Currently in Pain? Yes    Pain Score 6     Pain Location Generalized   legs, back   Pain Orientation --   everywhere   Pain Descriptors / Indicators Aching    Pain Type Chronic pain    Aggravating Factors  nothing in particular    Pain Relieving Factors resting and getting herself together                             [facility] Adult PT Treatment/Exercise - 12/19/19 1329      Transfers   Transfers Sit to Stand;Stand to  Sit    Sit to Stand 5: Supervision;Without upper extremity assist    Stand to Sit 5: Supervision;Without upper extremity assist    Comments 2 x8 reps no UE support, O2 before and after at 96%      Ambulation/Gait   Ambulation/Gait Yes    Ambulation/Gait Assistance 5: Supervision    Ambulation/Gait Assistance Details HR after 126 bpm, O2 at 95% and then decr to 92% with pt taking short shallow breaths. pt needing cues for pursed lip breathing and slowed deep breaths with pt's O2 incr to 98%    Ambulation Distance (Feet) 230 Feet    Assistive device None    Gait Pattern Step-through pattern;Decreased arm swing - right;Decreased arm swing - left;Decreased hip/knee flexion - right;Decreased hip/knee flexion - left;Right foot flat;Left foot flat;Wide base of support;Lateral hip instability    Ambulation Surface Level;Indoor      Knee/Hip Exercises: Standing   Knee Flexion Strengthening;AROM;Both;2 sets;10 reps    Hip Flexion Stengthening;AROM;Both;2 sets;10 reps    Hip Flexion Limitations marching with BUE support    Hip Abduction Stengthening;Both;1 set;10 reps    Abduction Limitations cues for technique, pt reporting incr knee discomfort after performing     Other Standing Knee Exercises with BUE support: side step to right and then side step to left x10 reps     Other Standing Knee Exercises with BUE support at countertop: 2 x 10 reps heel <> toe raises             Access Code: [facility] URL: [email or web address] Date: 09/21/2019 Prepared by: Janann August  Exercises Sit to Stand - 2 x daily - 7 x weekly - 2 sets - 5 reps Seated March - 1-2 x daily - 5 x weekly - 2 sets - 10 reps Seated Hip Abduction with Resistance - 1-2 x daily - 5 x weekly - 2 sets - 10 reps Seated Knee Flexion with Anchored Resistance - 1-2 x daily - 5 x weekly - 2 sets - 7 reps Heel Toe Raises with Counter Support - 1 x daily - 5 x weekly - 2 sets - 10 reps     PT Education - 12/19/19  1729    Education Details revisions/updates to pt's HEP    Person(s) Educated Patient    Methods Explanation;Demonstration;Handout    Comprehension Verbalized understanding;Returned demonstration            PT Short Term Goals - 10/19/19 1235      PT SHORT TERM GOAL #1   Title Pt will be independent with initial HEP in order to build upon functional gains made in therapy. ALL  STGS DUE AFTER 6 VISITS.    Baseline has not been performing consistently lately due to not feeling well.    Time 6   visits   Status On-going      PT SHORT TERM GOAL #2   Title Pt will undergo BERG/DGI when pt is able to tolerate and LTG written as appropriate.    Baseline DGI assessed on 10/19/19 - 22/24    Time 6    Period --   visits   Status Achieved      PT SHORT TERM GOAL #3   Title Pt will ambulate at least 150' with RPE rating at 6-7/10 or less with supervision in order to improve functional mobility.    Baseline 230' with RPE at 5/10 afterwards.    Time 6   visits   Status Achieved      PT SHORT TERM GOAL #4   Title Pt will perform 5x sit <> stand wit BUE support in 24 seconds or less in order to demo improved BLE strength.    Baseline 27.87 seconds from 21" mat table, without UE support: 14.12 seconds on 10/19/19    Time 6   visits   Status Achieved             PT Long Term Goals - 11/08/19 1154      PT LONG TERM GOAL #1   Title Pt will be independent with final HEP in order to build upon functional gains made in therapy. ALL LTGS DUE AFTER 15 VISITS.    Baseline pt has not been performing at home.    Time 15   visits   Status New      PT LONG TERM GOAL #2   Title Pt will improve 3MWT distance to at least 340' in order to demo improved walking endurance.    Baseline 295' on 11/08/19    Time 15   visits   Status Revised      PT LONG TERM GOAL #3   Title Pt will ambulate at least 300' over indoor surfaces with RPE rating at 3-4/10 or less with supervision in order to improve  functional mobility.    Baseline 5-6/10 RPE after 230' indoors    Time 15   visits   Status Revised      PT LONG TERM GOAL #4   Title Pt will improve 30 second chair stand to at least 11 sit <> stands from standard height mat table to demo improved functional BLE strength.    Baseline 10 sit <> stands from 21" mat table    Time 15   visits   Status Revised      PT LONG TERM GOAL #5   Title Pt will improve gait speed to at least 2.8 ft/sec in order to demo improved community mobility.    Baseline 2.44 ft/sec    Time 15   visits   Status New                 Plan - 12/19/19 1735    Clinical Impression Statement Focus of today's skilled session was revising/adding more standing exercises to pt's HEP. Incr reps from sit <> stand from 5 to 8. Pt tolerated well with multiple seated rest breaks taken throughout. O2 sats staying stable at 97-98% and HR at approx. 110 bpm after activity. After 2 laps of gait (230'), pt's HR incr to 126 bpm and O2 sats dropped to 92-93%, pt needing cues for slowed pursed lip  breathing and O2 sats quickly incr back up to 98%. Will continue to progress towards LTGs.    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, CHF, diabetes mellitus, HTN, neuropathy, obesity    Examination-Activity Limitations Stand;Sit;Transfers;Stairs;Squat;Locomotion Level    Examination-Participation Restrictions Church;Community Activity    Stability/Clinical Decision Making Evolving/Moderate complexity    Rehab Potential Good    PT Frequency 2x / week    PT Duration 4 weeks    PT Treatment/Interventions ADLs/Self Care Home Management;DME Instruction;Gait training;Stair training;Functional mobility training;Therapeutic activities;Balance training;Therapeutic exercise;Neuromuscular re-education;Patient/family education;Energy conservation    PT  Next Visit Plan standing functional strengthening/balance, gait/endurance    PT Home Exercise Plan [facility]    Consulted and Agree with Plan of Care Patient           Patient will benefit from skilled therapeutic intervention in order to improve the following deficits and impairments:  Abnormal gait, Cardiopulmonary status limiting activity, Decreased activity tolerance, Decreased balance, Decreased coordination, Decreased endurance, Decreased mobility, Difficulty walking, Decreased strength, Impaired sensation, Postural dysfunction, Obesity  Visit Diagnosis: Muscle weakness (generalized)  Unsteadiness on feet  Difficulty in walking, not elsewhere classified  Other abnormalities of gait and mobility     Problem List Patient Active Problem List   Diagnosis Date Noted  . Mixed stress and urge urinary incontinence 07/04/2019  . Nerve pain 05/16/2019  . Myofascial pain 05/16/2019  . Subluxation of acromioclavicular joint, right, sequela 03/23/2019  . Sickle cell trait ([place])   . Labile blood glucose   . Diabetic peripheral neuropathy ([place])   . Hypokalemia   . Pain in joint of left shoulder   . Debility   . 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 COVID-19   . History of anemia   . Super obese   . Poorly controlled type 2 diabetes mellitus with peripheral neuropathy ([place])   . Acute on chronic diastolic (congestive) heart failure ([place])   . Hypernatremia   . Shock circulatory ([place])   . Septic shock ([place])   . Hypotension 01/13/2019  . Acute respiratory failure with hypoxemia ([place]) 01/13/2019  . Acute respiratory distress syndrome (ARDS) due to COVID-19 virus ([place]) 01/12/2019  . Grade I diastolic dysfunction 33/29/5188  . 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 Dyspnea on exertion 05/19/2016  . Other fatigue 05/19/2016  . Absolute anemia 04/15/2016  . Left leg weakness 10/18/2014  . Left leg paresthesias 10/18/2014  . Acute left-sided weakness 10/18/2014  . Encounter for  central line placement 03/08/2014  . Taking drug for chronic disease 03/08/2014  . H/O insertion of insulin pump 02/18/2014  . Long term current use of insulin ([place]) 02/18/2014  . Insulin pump in place 02/18/2014  . 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 Abnormal ballistocardiogram 05/18/2013  . Abnormal nuclear stress test 05/18/2013  . 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 NICM (nonischemic cardiomyopathy) ([place]) 05/01/2013  . Insulin dependent type 2 diabetes mellitus ([place]) 04/10/2013  . Sex counseling 04/10/2013  . Edema leg 04/10/2013  . Upper respiratory infection, viral 04/10/2013  . Pedal edema 03/29/2013  . Pneumonia 12/12/2012  . Essential hypertension 12/12/2012  . HLD (hyperlipidemia) 12/12/2012  . Tonsillar mass 12/01/2012  . 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 Leukocytosis   . Anemia   . High cholesterol   . Hypertension   . Blurred vision 09/11/2011  . Foot pain 09/11/2011  . Breast pain 09/11/2011  .  Morbid obesity ([place]) 09/11/2011  . Fungal infection of nail 09/11/2011  . Avitaminosis D 09/11/2011    Arliss Journey, PT, DPT  12/19/2019, 5:36 PM  [facility] [address] [place], [place], [address] Phone: [phone]   Fax:  [phone]  Name: SOLOMIYA PASCALE MRN: [id number] Date of Birth: 11-07-1965

## 2019-12-19 NOTE — Patient Instructions (Signed)
Access Code: [facility] URL: [email or web address] Date: 12/19/2019 Prepared by: Janann August  Exercises Sit to Stand - 2 x daily - 7 x weekly - 2 sets - 8 reps Heel Toe Raises with Counter Support - 1-2 x daily - 5 x weekly - 2 sets - 10 reps Standing March with Counter Support - 1-2 x daily - 5 x weekly - 2 sets - 10 reps Side Stepping with Counter Support - 1-2 x daily - 5 x weekly - 2 sets - 10 reps Standing Alternating Knee Flexion - 1-2 x daily - 5 x weekly - 2 sets - 10 reps

## 2019-12-21 ENCOUNTER — Ambulatory Visit: Payer: Medicaid Other | Admitting: Physical Therapy

## 2019-12-21 ENCOUNTER — Encounter: Payer: Self-pay | Admitting: Physical Therapy

## 2019-12-21 ENCOUNTER — Other Ambulatory Visit: Payer: Self-pay

## 2019-12-21 DIAGNOSIS — M6281 Muscle weakness (generalized): Secondary | ICD-10-CM

## 2019-12-21 DIAGNOSIS — R2681 Unsteadiness on feet: Secondary | ICD-10-CM

## 2019-12-21 DIAGNOSIS — R2689 Other abnormalities of gait and mobility: Secondary | ICD-10-CM

## 2019-12-21 DIAGNOSIS — R262 Difficulty in walking, not elsewhere classified: Secondary | ICD-10-CM

## 2019-12-21 NOTE — Therapy (Signed)
[facility] [address] [place], [place], [address] Phone: [phone]   Fax:  [phone]  Physical Therapy Treatment  Patient Details  Name: Kaitlin [name] MRN: [id number] Date of Birth: November 24, 1965 Referring Provider (PT): Dr. Courtney Heys    Encounter Date: 12/21/2019   PT End of Session - 12/21/19 1323    Visit Number 11    Number of Visits 15    Authorization Type Medicaid - 6 PT visits approved 12/19/19 - 01/02/20    Authorization - Visit Number 2    Authorization - Number of Visits 6    PT Start Time 0626    PT Stop Time 1315    PT Time Calculation (min) 40 min    Activity Tolerance Patient limited by fatigue;Patient tolerated treatment well    Behavior During Therapy [facility] for tasks assessed/performed           Past Medical History:  Diagnosis Date  . Anemia   . Bronchitis   . Cataracts, bilateral 01/12/2018  . CHF (congestive heart failure) ([place])   . Diabetes mellitus   . High cholesterol   . Hypertension    takes medicine to protect kidneys, does not have HTN  . Leukocytosis   . Morbid obesity ([place]) 09/11/2011   BMI 57  . Neuropathy 01/12/2018  . NICM (nonischemic cardiomyopathy) ([place]) 05/01/2013   Overview:  Last Assessment & Plan:  euvolemic on physical exam.   . Obesity   . Sickle cell trait ([place])   . Thymoma     Past Surgical History:  Procedure Laterality Date  . COLONOSCOPY N/A 06/21/2017   Procedure: COLONOSCOPY;  Surgeon: Ronnette Juniper, MD;  Location: [facility];  Service: Gastroenterology;  Laterality: N/A;  . LEFT HEART CATHETERIZATION WITH CORONARY ANGIOGRAM N/A 05/25/2013   Procedure: LEFT HEART CATHETERIZATION WITH CORONARY ANGIOGRAM;  Surgeon: Troy Sine, MD;  Location: [facility] CATH LAB;  Service: Cardiovascular;  Laterality: N/A;  . LYMPH NODE BIOPSY Right 11/30/2012   Procedure: RIGHT TONSIL BIOPSY WITH FRESH FROZEN ANALYSIS;  Surgeon: Jodi Marble, MD;  Location: [facility];   Service: ENT;  Laterality: Right;  . MEDIASTERNOTOMY N/A 01/12/2013   Procedure: MEDIAN STERNOTOMY;  Surgeon: Gaye Pollack, MD;  Location: [facility];  Service: Thoracic;  Laterality: N/A;  . MEDIASTINOTOMY CHAMBERLAIN MCNEIL Right 11/06/2012   Procedure: MEDIASTINOTOMY CHAMBERLAIN MCNEILPROCEDURE;  Surgeon: Gaye Pollack, MD;  Location: [facility];  Service: Thoracic;  Laterality: Right;  . POLYPECTOMY  06/21/2017   Procedure: POLYPECTOMY;  Surgeon: Ronnette Juniper, MD;  Location: [facility];  Service: Gastroenterology;;  . RESECTION OF A THYMOMA N/A 01/12/2013   Procedure: RESECTION OF A THYMOMA;  Surgeon: Gaye Pollack, MD;  Location: [facility];  Service: Thoracic;  Laterality: N/A;  . TONSILLECTOMY    . TUBAL LIGATION      There were no vitals filed for this visit.   Subjective Assessment - 12/21/19 1236    Subjective Was able to try the sit <> stands at home, have not yet tried the standing counter exercises. 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 2-3 minutes    How long can you walk comfortably? 150' approx. back into therapy gym    Patient Stated Goals wants to be able to walk normal and not feel like she is going  to topple over. standing up is difficult    Currently in Pain? Yes    Pain Score 6     Pain Location Generalized    Pain Orientation --   all over   Pain Descriptors / Indicators Aching    Pain Type Chronic pain    Aggravating Factors  nothing in particular    Pain Relieving Factors resting                             [facility] Adult PT Treatment/Exercise - 12/21/19 1238      Ambulation/Gait   Ambulation/Gait Yes    Ambulation/Gait Assistance 5: Supervision    Ambulation/Gait Assistance Details O2 after 1st bout: 97%, HR: 117 bpm, after 2nd bout: O2 98%, HR: 125 bpm    Ambulation Distance  (Feet) 230 Feet   x2   Assistive device None    Gait Pattern Step-through pattern;Decreased arm swing - right;Decreased arm swing - left;Decreased hip/knee flexion - right;Decreased hip/knee flexion - left;Right foot flat;Left foot flat;Wide base of support;Lateral hip instability    Ambulation Surface Level;Indoor      Knee/Hip Exercises: Standing   Heel Raises Both;2 sets;10 reps    Heel Raises Limitations with BUE support: 2 x10 reps heel <> toe raises with 2# ankle weight    Knee Flexion Strengthening;AROM;Both;2 sets;10 reps    Knee Flexion Limitations with 2# ankle weight    Hip Flexion Stengthening;AROM;Both;2 sets;10 reps    Hip Flexion Limitations marching with B hip flexion alternating 2 x 10 reps with 2# ankle weight    Hip Abduction Stengthening;Both;1 set;10 reps    Abduction Limitations with 2" ankle weight x10 reps alternating lateral step overs 4" beam for incr foot clearance    Other Standing Knee Exercises on outside of // bars with BUE support: side stepping down and back x4 reps, HR after 125 bpm    Other Standing Knee Exercises with BUE support: side step to right and then side step to left x10 reps with 2# ankle weight. x10 reps mini squats with verbal and demo cues for proper technique/alignment                    PT Short Term Goals - 10/19/19 1235      PT SHORT TERM GOAL #1   Title Pt will be independent with initial HEP in order to build upon functional gains made in therapy. ALL STGS DUE AFTER 6 VISITS.    Baseline has not been performing consistently lately due to not feeling well.    Time 6   visits   Status On-going      PT SHORT TERM GOAL #2   Title Pt will undergo BERG/DGI when pt is able to tolerate and LTG written as appropriate.    Baseline DGI assessed on 10/19/19 - 22/24    Time 6    Period --   visits   Status Achieved      PT SHORT TERM GOAL #3   Title Pt will ambulate at least 150' with RPE rating at 6-7/10 or less with supervision  in order to improve functional mobility.    Baseline 230' with RPE at 5/10 afterwards.    Time 6   visits   Status Achieved      PT SHORT TERM GOAL #4   Title Pt will perform 5x sit <> stand wit BUE support in 24 seconds or less in  order to demo improved BLE strength.    Baseline 27.87 seconds from 21" mat table, without UE support: 14.12 seconds on 10/19/19    Time 6   visits   Status Achieved             PT Long Term Goals - 11/08/19 1154      PT LONG TERM GOAL #1   Title Pt will be independent with final HEP in order to build upon functional gains made in therapy. ALL LTGS DUE AFTER 15 VISITS.    Baseline pt has not been performing at home.    Time 15   visits   Status New      PT LONG TERM GOAL #2   Title Pt will improve 3MWT distance to at least 340' in order to demo improved walking endurance.    Baseline 295' on 11/08/19    Time 15   visits   Status Revised      PT LONG TERM GOAL #3   Title Pt will ambulate at least 300' over indoor surfaces with RPE rating at 3-4/10 or less with supervision in order to improve functional mobility.    Baseline 5-6/10 RPE after 230' indoors    Time 15   visits   Status Revised      PT LONG TERM GOAL #4   Title Pt will improve 30 second chair stand to at least 11 sit <> stands from standard height mat table to demo improved functional BLE strength.    Baseline 10 sit <> stands from 21" mat table    Time 15   visits   Status Revised      PT LONG TERM GOAL #5   Title Pt will improve gait speed to at least 2.8 ft/sec in order to demo improved community mobility.    Baseline 2.44 ft/sec    Time 15   visits   Status New                 Plan - 12/21/19 1325    Clinical Impression Statement Today's skilled session continued to focus on activity and standing tolerance. Pt needing frequent seated rest breaks throughout session due to fatigue. Pt's HR incr to 110-120 bpm after standing activities. Pt only able to ambulate a max of  230' today before needing a break due to fatigue. Will continue to progress towards LTGs.    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, CHF, diabetes mellitus, HTN, neuropathy, obesity    Examination-Activity Limitations Stand;Sit;Transfers;Stairs;Squat;Locomotion Level    Examination-Participation Restrictions Church;Community Activity    Stability/Clinical Decision Making Evolving/Moderate complexity    Rehab Potential Good    PT Frequency 2x / week    PT Duration 4 weeks    PT Treatment/Interventions ADLs/Self Care Home Management;DME Instruction;Gait training;Stair training;Functional mobility training;Therapeutic activities;Balance training;Therapeutic exercise;Neuromuscular re-education;Patient/family education;Energy conservation    PT Next Visit Plan standing functional strengthening/balance, gait/endurance    PT Home Exercise Plan [facility]    Consulted and Agree with Plan of Care Patient           Patient will benefit from skilled therapeutic intervention in order to improve the following deficits and impairments:  Abnormal gait,Cardiopulmonary status limiting activity,Decreased activity tolerance,Decreased balance,Decreased coordination,Decreased endurance,Decreased mobility,Difficulty walking,Decreased strength,Impaired sensation,Postural dysfunction,Obesity  Visit Diagnosis: Muscle weakness (generalized)  Unsteadiness on feet  Difficulty in walking, not elsewhere classified  Other abnormalities of gait and mobility  Problem List Patient Active Problem List   Diagnosis Date Noted  . Mixed stress and urge urinary incontinence 07/04/2019  . Nerve pain 05/16/2019  . Myofascial pain 05/16/2019  . Subluxation of acromioclavicular joint, right, sequela 03/23/2019  . Sickle cell trait ([place])   . Labile blood glucose   . Diabetic  peripheral neuropathy ([place])   . Hypokalemia   . Pain in joint of left shoulder   . Debility   . 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 COVID-19   . History of anemia   . Super obese   . Poorly controlled type 2 diabetes mellitus with peripheral neuropathy ([place])   . Acute on chronic diastolic (congestive) heart failure ([place])   . Hypernatremia   . Shock circulatory ([place])   . Septic shock ([place])   . Hypotension 01/13/2019  . Acute respiratory failure with hypoxemia ([place]) 01/13/2019  . Acute respiratory distress syndrome (ARDS) due to COVID-19 virus ([place]) 01/12/2019  . Grade I diastolic dysfunction 55/37/4827  . 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 Dyspnea on exertion 05/19/2016  . Other fatigue 05/19/2016  . Absolute anemia 04/15/2016  . Left leg weakness 10/18/2014  . Left leg paresthesias 10/18/2014  . Acute left-sided weakness 10/18/2014  . Encounter for central line placement 03/08/2014  . Taking drug for chronic disease 03/08/2014  . H/O insertion of insulin pump 02/18/2014  . Long term current use of insulin ([place]) 02/18/2014  . Insulin pump in place 02/18/2014  . 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 Abnormal ballistocardiogram 05/18/2013  . Abnormal nuclear stress test 05/18/2013  . 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 NICM (nonischemic cardiomyopathy) ([place]) 05/01/2013  . Insulin dependent type 2 diabetes mellitus ([place]) 04/10/2013  . Sex counseling 04/10/2013  . Edema leg 04/10/2013  . Upper respiratory infection, viral 04/10/2013  . Pedal edema 03/29/2013  . Pneumonia 12/12/2012  . Essential hypertension 12/12/2012  . HLD (hyperlipidemia) 12/12/2012  . Tonsillar mass 12/01/2012  . 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 Leukocytosis   . Anemia   . High cholesterol   . Hypertension   . Blurred vision 09/11/2011  . Foot pain 09/11/2011  . Breast pain 09/11/2011  . Morbid obesity ([place]) 09/11/2011  . Fungal infection of nail 09/11/2011   . Avitaminosis D 09/11/2011    Arliss Journey, PT, DPT  12/21/2019, 1:26 PM  [facility] [address] [place], [place], [address] Phone: [phone]   Fax:  [phone]  Name: Kaitlin [name] MRN: [id number] Date of Birth: Nov 29, 1965

## 2019-12-26 ENCOUNTER — Ambulatory Visit: Payer: Medicaid Other | Admitting: Physical Therapy

## 2019-12-27 ENCOUNTER — Ambulatory Visit: Payer: Medicaid Other | Admitting: Physical Therapy

## 2019-12-28 ENCOUNTER — Ambulatory Visit: Payer: Medicaid Other | Admitting: Physical Therapy

## 2019-12-31 ENCOUNTER — Ambulatory Visit: Payer: Medicaid Other | Admitting: Physical Therapy

## 2020-01-02 ENCOUNTER — Other Ambulatory Visit: Payer: Self-pay

## 2020-01-02 ENCOUNTER — Encounter: Payer: Self-pay | Admitting: Physical Medicine and Rehabilitation

## 2020-01-02 ENCOUNTER — Ambulatory Visit: Payer: Medicaid Other | Admitting: Physical Therapy

## 2020-01-02 ENCOUNTER — Encounter
Payer: Medicaid Other | Attending: Physical Medicine and Rehabilitation | Admitting: Physical Medicine and Rehabilitation

## 2020-01-02 VITALS — BP 149/77 | HR 104 | Temp 98.4°F | Ht 69.0 in | Wt 389.0 lb

## 2020-01-02 DIAGNOSIS — E669 Obesity, unspecified: Secondary | ICD-10-CM

## 2020-01-02 DIAGNOSIS — G7281 Critical illness myopathy: Secondary | ICD-10-CM | POA: Insufficient documentation

## 2020-01-02 DIAGNOSIS — U099 Post covid-19 condition, unspecified: Secondary | ICD-10-CM

## 2020-01-02 DIAGNOSIS — M7918 Myalgia, other site: Secondary | ICD-10-CM | POA: Diagnosis not present

## 2020-01-02 NOTE — Progress Notes (Signed)
Subjective:    Patient ID: Kaitlin [name], female    DOB: 1965/02/03, 54 y.o.   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    Wants trigger point injections today.   Had to move- without daughter's boyfriend.  In the midst of moving right now-  Has other 2 grand-kids so doesn't want them around this.  Is moving in with son- who has stairs.    Is beeping on insulin pump- is an alert. No issues.     Pain Inventory Average Pain 5 Pain Right Now 5 My pain is constant, tingling and aching  In the last 24 hours, has pain interfered with the following? General activity 6 Relation with others 2 Enjoyment of life 5 What TIME of day is your pain at its worst? morning  and varies Sleep (in general) Fair  Pain is worse with: bending and some activites Pain improves with: rest, heat/ice and medication Relief from Meds: 5  Family History  Problem Relation Age of Onset  . Heart disease Father        No details  . Diabetes type II Other        Family HX  . Breast cancer Other    Social History   Socioeconomic History  . Marital status: Married    Spouse name: Not on file  . Number of children: 3  . Years of education: 60  . Highest education level: Not on file  Occupational History  . Occupation: Unemployed  Tobacco Use  . Smoking status: Passive Smoke Exposure - Never Smoker  . Smokeless tobacco: Never Used  Vaping Use  . Vaping Use: Never used  Substance and Sexual Activity  . Alcohol use: No  . Drug use: No  . Sexual activity: Yes    Birth control/protection: Surgical  Other Topics Concern  . Not on file  Social History Narrative   Lives at home with mother, husband and son   Caffeine use: none   Social Determinants of Health   Financial Resource Strain: Not on file  Food Insecurity: Not on file  Transportation Needs: Not on file  Physical Activity: Not on file  Stress: Not on file   Social Connections: Not on file   Past Surgical History:  Procedure Laterality Date  . COLONOSCOPY N/A 06/21/2017   Procedure: COLONOSCOPY;  Surgeon: Kerin Salen, MD;  [facility];  Service: Gastroenterology;  Laterality: N/A;  . LEFT HEART CATHETERIZATION WITH CORONARY ANGIOGRAM N/A 05/25/2013   Procedure: LEFT HEART CATHETERIZATION WITH CORONARY ANGIOGRAM;  Surgeon: Lennette Bihari, MD;  Location: [facility] CATH LAB;  Service: Cardiovascular;  Laterality: N/A;  . LYMPH NODE BIOPSY Right 11/30/2012   Procedure: RIGHT TONSIL BIOPSY WITH FRESH FROZEN ANALYSIS;  Surgeon: Flo Shanks, MD;  Location: [facility] OR;  Service: ENT;  Laterality: Right;  . MEDIASTERNOTOMY N/A 01/12/2013   Procedure: MEDIAN STERNOTOMY;  Surgeon: Alleen Borne, MD;  Location: [facility] OR;  Service: Thoracic;  Laterality: N/A;  . MEDIASTINOTOMY CHAMBERLAIN MCNEIL Right 11/06/2012   Procedure: MEDIASTINOTOMY CHAMBERLAIN MCNEILPROCEDURE;  Surgeon: Alleen Borne, MD;  Location: [facility] OR;  Service: Thoracic;  Laterality: Right;  . POLYPECTOMY  06/21/2017   Procedure: POLYPECTOMY;  Surgeon: Kerin Salen, MD;  Location: [facility];  Service: Gastroenterology;;  . RESECTION OF A THYMOMA N/A 01/12/2013   Procedure: RESECTION OF A THYMOMA;  Surgeon: Alleen Borne, MD;  Location: [facility]  OR;  Service: Thoracic;  Laterality: N/A;  . TONSILLECTOMY    . TUBAL LIGATION     Past Surgical History:  Procedure Laterality Date  . COLONOSCOPY N/A 06/21/2017   Procedure: COLONOSCOPY;  Surgeon: Ronnette Juniper, MD;  [facility];  Service: Gastroenterology;  Laterality: N/A;  . LEFT HEART CATHETERIZATION WITH CORONARY ANGIOGRAM N/A 05/25/2013   Procedure: LEFT HEART CATHETERIZATION WITH CORONARY ANGIOGRAM;  Surgeon: Troy Sine, MD;  Location: [facility] CATH LAB;  Service: Cardiovascular;  Laterality: N/A;  . LYMPH NODE BIOPSY Right 11/30/2012   Procedure: RIGHT TONSIL BIOPSY WITH FRESH FROZEN ANALYSIS;  Surgeon: Jodi Marble, MD;  Location: [facility];   Service: ENT;  Laterality: Right;  . MEDIASTERNOTOMY N/A 01/12/2013   Procedure: MEDIAN STERNOTOMY;  Surgeon: Gaye Pollack, MD;  Location: [facility];  Service: Thoracic;  Laterality: N/A;  . MEDIASTINOTOMY CHAMBERLAIN MCNEIL Right 11/06/2012   Procedure: MEDIASTINOTOMY CHAMBERLAIN MCNEILPROCEDURE;  Surgeon: Gaye Pollack, MD;  Location: [facility];  Service: Thoracic;  Laterality: Right;  . POLYPECTOMY  06/21/2017   Procedure: POLYPECTOMY;  Surgeon: Ronnette Juniper, MD;  Location: [facility];  Service: Gastroenterology;;  . RESECTION OF A THYMOMA N/A 01/12/2013   Procedure: RESECTION OF A THYMOMA;  Surgeon: Gaye Pollack, MD;  Location: [facility];  Service: Thoracic;  Laterality: N/A;  . TONSILLECTOMY    . TUBAL LIGATION     Past Medical History:  Diagnosis Date  . Anemia   . Bronchitis   . Cataracts, bilateral 01/12/2018  . CHF (congestive heart failure) ([place])   . Diabetes mellitus   . High cholesterol   . Hypertension    takes medicine to protect kidneys, does not have HTN  . Leukocytosis   . Morbid obesity ([place]) 09/11/2011   BMI 57  . Neuropathy 01/12/2018  . NICM (nonischemic cardiomyopathy) ([place]) 05/01/2013   Overview:  Last Assessment & Plan:  euvolemic on physical exam.   . Obesity   . Sickle cell trait ([place])   . Thymoma    BP (!) 149/77   Pulse (!) 104   Temp 98.4 F (36.9 C)   Ht 5\' 9"  (1.753 m)   Wt (!) 389 lb (176.4 kg)   LMP 06/17/2012   SpO2 94%   BMI 57.45 kg/m   Opioid Risk Score:   Fall Risk Score:  `1  Depression screen PHQ 2/9  Depression screen [facility] 2/9 07/04/2019 03/23/2019  Decreased Interest 0 0  Down, Depressed, Hopeless 0 0  PHQ - 2 Score 0 0  Altered sleeping - 2  Tired, decreased energy - 3  Change in appetite - 0  Feeling bad or failure about yourself  - 0  Trouble concentrating - 1  Moving slowly or fidgety/restless - 0  Suicidal thoughts - 0  PHQ-9 Score - 6  Difficult doing work/chores - Somewhat difficult  Some recent data might be hidden     Review of Systems  Constitutional: Negative.   HENT: Negative.   Eyes: Negative.   Respiratory: Negative.   Cardiovascular: Negative.   Gastrointestinal: Negative.   Endocrine: Negative.   Genitourinary: Negative.   Musculoskeletal: Positive for arthralgias, back pain and neck pain.  Skin: Negative.   Allergic/Immunologic: Negative.   Neurological: Positive for weakness and numbness.       Tingling   Psychiatric/Behavioral: Negative.   All other systems reviewed and are negative.      Objective:   Physical Exam  Awake, alert, appropriate, wearing betty boop mask, NAD Tight  in trigger points R>L -pecs, scalenes, upper traps, rhomboids and levators.       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 Still has severe difficulty with endurance due to ICU myopathy.    1. Got hospital bed from [facility]- [phone].  Hospital bed and w/c.   2. Patient here for trigger point injections for  Consent done and on chart.  Cleaned areas with alcohol and injected using a 27 gauge 1.5 inch needle  Injected 5cc Using 1% Lidocaine with no EPI  Upper traps B/L Levators B/L Posterior scalenes B/L Middle scalenes Splenius Capitus Pectoralis Major R only Rhomboids R x3  Infraspinatus Teres Major/minor Thoracic paraspinals Lumbar paraspinals Other injections-    Patient's level of pain prior was 5/10 Current level of pain after injections is 5/10- usually kicks in by the car.   There was no bleeding or complications.  Patient was advised to drink a lot of water on day after injections to flush system Will have increased soreness for 12-48 hours after injections.  Can use Lidocaine patches the day AFTER injections Can use theracane on day of injections in places didn't inject Can use heating pad 4-6 hours AFTER injections  3. Pec stretch- all the way down- 30-60 seconds in each location. Can do on own- ~  2x/week.   4. Abduction arm stretch start low and work up wall slowly- make sure middle finger is pushed back some for the full stretch. Can do on own- can do ~ 2x/week.   5. F/U in 2 months for trigger point injections  6. Call [facility] if ever feels unsafe again.   7. Of note, pt doesn't feel safe in current situation- has called police without help so will give her my number to call  Plans to get another restraining order against daughter's boyfriend.  Asked her to call them during the move- I'm concerned about her wellbeing and that she needs police help to protect her and her daughter and grandson. Has called police at least 3x without meaningful help. 1x they didn't even come- when called 911-  3-4 weeks ago.   I spent a total of 40 minutes on visit- details as above.

## 2020-01-02 NOTE — Patient Instructions (Addendum)
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 Still has severe endurance issues due to ICU myopathy. Has Long COVID, of note.   1. Got hospital bed from [facility]- [phone].  Hospital bed and w/c.   2. Patient here for trigger point injections for  Consent done and on chart.  Cleaned areas with alcohol and injected using a 27 gauge 1.5 inch needle  Injected 5cc Using 1% Lidocaine with no EPI  Upper traps B/L Levators B/L Posterior scalenes B/L Middle scalenes Splenius Capitus Pectoralis Major R only Rhomboids R x3  Infraspinatus Teres Major/minor Thoracic paraspinals Lumbar paraspinals Other injections-    Patient's level of pain prior was 5/10 Current level of pain after injections is 5/10- usually kicks in by the car.   There was no bleeding or complications.  Patient was advised to drink a lot of water on day after injections to flush system Will have increased soreness for 12-48 hours after injections.  Can use Lidocaine patches the day AFTER injections Can use theracane on day of injections in places didn't inject Can use heating pad 4-6 hours AFTER injections  3. Pec stretch- all the way down- 30-60 seconds in each location. Can do on own- ~ 2x/week.   4. Abduction arm stretch start low and work up wall slowly- make sure middle finger is pushed back some for the full stretch. Can do on own- can do ~ 2x/week.   5. F/U in 2 months for trigger point injections  6. Call [facility] if ever feels unsafe again.   7. [phone] FYI  7. Of note, pt doesn't feel safe in current situation- has called police without help so will give her my number to call  Plans to get another restraining order against daughter's boyfriend. Asked her to call them during the move- I'm concerned about her wellbeing and that she needs police help to protect her and her daughter and grandson. Has called police at least 3x without meaningful help. 1x they didn't  even come- when called 911-  3-4 weeks ago.

## 2020-02-27 ENCOUNTER — Ambulatory Visit (HOSPITAL_COMMUNITY)
Admission: EM | Admit: 2020-02-27 | Discharge: 2020-02-27 | Disposition: A | Discharge status: Home / Self Care | Payer: Medicare HMO | Attending: Family Medicine | Admitting: Family Medicine

## 2020-02-27 ENCOUNTER — Other Ambulatory Visit: Payer: Self-pay

## 2020-02-27 DIAGNOSIS — Z20822 Contact with and (suspected) exposure to covid-19: Secondary | ICD-10-CM | POA: Diagnosis not present

## 2020-02-27 NOTE — ED Triage Notes (Signed)
Pt had loss of taste and smell 10 days ago , wants covid test

## 2020-02-28 LAB — SARS CORONAVIRUS 2 (TAT 6-24 HRS): SARS Coronavirus 2: NEGATIVE

## 2020-03-05 ENCOUNTER — Encounter: Payer: Medicare HMO | Admitting: Physical Medicine and Rehabilitation

## 2020-03-13 ENCOUNTER — Other Ambulatory Visit: Payer: Self-pay | Admitting: Physician Assistant

## 2020-03-13 DIAGNOSIS — Z1231 Encounter for screening mammogram for malignant neoplasm of breast: Secondary | ICD-10-CM

## 2020-03-21 ENCOUNTER — Encounter: Payer: Medicare HMO | Attending: Physical Medicine and Rehabilitation | Admitting: Physical Medicine and Rehabilitation

## 2020-03-21 ENCOUNTER — Other Ambulatory Visit: Payer: Self-pay | Admitting: Physical Medicine and Rehabilitation

## 2020-03-21 ENCOUNTER — Encounter: Payer: Self-pay | Admitting: Physical Medicine and Rehabilitation

## 2020-03-21 ENCOUNTER — Other Ambulatory Visit: Payer: Self-pay

## 2020-03-21 VITALS — BP 142/88 | HR 91 | Temp 98.4°F | Ht 69.0 in | Wt 396.6 lb

## 2020-03-21 DIAGNOSIS — M7918 Myalgia, other site: Secondary | ICD-10-CM | POA: Insufficient documentation

## 2020-03-21 DIAGNOSIS — R600 Localized edema: Secondary | ICD-10-CM

## 2020-03-21 DIAGNOSIS — U099 Post covid-19 condition, unspecified: Secondary | ICD-10-CM | POA: Diagnosis not present

## 2020-03-21 DIAGNOSIS — E669 Obesity, unspecified: Secondary | ICD-10-CM

## 2020-03-21 DIAGNOSIS — G9331 Postviral fatigue syndrome: Secondary | ICD-10-CM

## 2020-03-21 DIAGNOSIS — G933 Postviral fatigue syndrome: Secondary | ICD-10-CM | POA: Insufficient documentation

## 2020-03-21 MED ORDER — DICLOFENAC SODIUM 50 MG PO TBEC: DELAYED_RELEASE_TABLET | ORAL | 3 refills | Status: DC

## 2020-03-21 NOTE — Patient Instructions (Signed)
  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 Still has severe difficulty with endurance due to ICU myopathy.   With new LE edema.  And weaker hands/wrists B/L  1. Don't take potassium pill unless takes Lasix/fluid pill.   2. Check CBC with diff due to possible recent infection   3. CMP and Magnesium level to check lytes as well as kidney function considering Lasix dosing has been off; Also want to check LFTs and Albumin because pt is c/o "being off" and cannot figure out how/why.   4. Will give Diclofenac prescription, however don't want pt to take until I get lab results back.   5. Patient here for trigger point injections for  Consent done and on chart.  Cleaned areas with alcohol and injected using a 27 gauge 1.5 inch needle  Injected 3cc Using 1% Lidocaine with no EPI  Upper traps B/L Levators B/L Posterior scalenes B/L x2 Middle scalenes B/L Splenius Capitus Pectoralis Major B/L Rhomboids B/L Infraspinatus Teres Major/minor Thoracic paraspinals Lumbar paraspinals Other injections-    Patient's level of pain prior was 6/10 Current level of pain after injections is still around 6/10- ROM not different yet- usually kicks in by time gets in car.   There was no bleeding or complications.  Patient was advised to drink a lot of water on day after injections to flush system Will have increased soreness for 12-48 hours after injections.  Can use Lidocaine patches the day AFTER injections Can use theracane on day of injections in places didn't inject or the day after Can use heating pad 4-6 hours AFTER injections  6. I think she DID have COVID again, because she's having exacerbation of her LONG COVID Sx's.  It usually gets better within ~ 1 month-2 months . Go back to doing exercises that did when left rehab- esp use putty!  7. Will call her when get lab results- at least to tell her she can restart Diclofenac.   8. F/U- 6-8  weeks for trigger point injections

## 2020-03-21 NOTE — Progress Notes (Addendum)
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 Still has severe difficulty with endurance due to ICU myopathy.     Upset that has gained 7 lbs since last visit.  Hasn't been taking fluid pills- because has to stay near bathroom- and forgets a lot.  Taking fluid pill every other day or so- scheduled to take 2x/day.   Arms feeling heavy lately- was tingling more-but tingling has improved as of now. Arm heavy feeling is better, but not resolve-d the pain/hurting/aching/tingling has gone away.   Thinking that might have had COVID- granddaughter had it- ~ 1 month ago.  No brain fog initially said, (but cannot remember "anything" lately, so actually having a lot of brain fog- driving and forgets where she's going), however, severely tired- but also not getting a lot of sleep.  Taste buds also got worse- did get tested- was negative, later afterwards.    Feels really off- needs to make appointment with PCP.      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 Still has severe difficulty with endurance due to ICU myopathy.   With new LE edema.  And weaker hands/wrists B/L  1. Don't take potassium pill unless takes Lasix/fluid pill.   2. Check CBC with diff due to possible recent infection   3. CMP and Magnesium level to check lytes as well as kidney function considering Lasix dosing has been off; Also want to check LFTs and Albumin because pt is c/o "being off" and cannot figure out how/why.   4. Will give Diclofenac prescription, however don't want pt to take until I get lab results back.   5. Patient here for trigger point injections for  Consent done and  on chart.  Cleaned areas with alcohol and injected using a 27 gauge 1.5 inch needle  Injected 3cc Using 1% Lidocaine with no EPI  Upper traps B/L Levators B/L Posterior scalenes B/L x2 Middle scalenes B/L Splenius Capitus Pectoralis Major B/L Rhomboids B/L Infraspinatus Teres Major/minor Thoracic paraspinals Lumbar paraspinals Other injections-    Patient's level of pain prior was 6/10 Current level of pain after injections is still around 6/10- ROM not different yet- usually kicks in by time gets in car.   There was no bleeding or complications.  Patient was advised to drink a lot of water on day after injections to flush system Will have increased soreness for 12-48 hours after injections.  Can use Lidocaine patches the day AFTER injections Can use theracane on day of injections in places didn't inject or the day after Can use heating pad 4-6 hours AFTER injections  6. I think she DID have COVID again, because she's having exacerbation of her LONG COVID Sx's.  It usually gets better within ~ 1 month-2 months . Go back to doing exercises that did when left rehab- esp use putty!  7. Will call her when get lab results- at least to tell her she can restart Diclofenac.   8. F/U- 6-8 weeks for trigger point injections.   9. Pt has urge and stress incontinence ever since she had COVID and ICU myopathy- she still  needs incontinence briefs as well  As underpads and gloves to handle her bladder issues- I don't see this improving in the foreseeable future. So at this point, will prescribe for lifetime, but if stops needing them, of course will not further prescribe. Of note, made work by taking fluid pills.   I spent a total of 30 minutes on visit- as detailed above.

## 2020-03-22 LAB — CBC WITH DIFFERENTIAL/PLATELET
Basophils Absolute: 0 10*3/uL (ref 0.0–0.2)
Basos: 0 %
EOS (ABSOLUTE): 0 10*3/uL (ref 0.0–0.4)
Eos: 0 %
Hematocrit: 41.5 % (ref 34.0–46.6)
Hemoglobin: 13.2 g/dL (ref 11.1–15.9)
Immature Grans (Abs): 0 10*3/uL (ref 0.0–0.1)
Immature Granulocytes: 0 %
Lymphocytes Absolute: 3.8 10*3/uL — ABNORMAL HIGH (ref 0.7–3.1)
Lymphs: 40 %
MCH: 25.3 pg — ABNORMAL LOW (ref 26.6–33.0)
MCHC: 31.8 g/dL (ref 31.5–35.7)
MCV: 80 fL (ref 79–97)
Monocytes Absolute: 0.7 10*3/uL (ref 0.1–0.9)
Monocytes: 7 %
Neutrophils Absolute: 5.1 10*3/uL (ref 1.4–7.0)
Neutrophils: 53 %
Platelets: 300 10*3/uL (ref 150–450)
RBC: 5.21 x10E6/uL (ref 3.77–5.28)
RDW: 16.1 % — ABNORMAL HIGH (ref 11.7–15.4)
WBC: 9.6 10*3/uL (ref 3.4–10.8)

## 2020-03-22 LAB — MAGNESIUM: Magnesium: 2.4 mg/dL — ABNORMAL HIGH (ref 1.6–2.3)

## 2020-03-22 LAB — COMPREHENSIVE METABOLIC PANEL
ALT: 32 IU/L (ref 0–32)
AST: 23 IU/L (ref 0–40)
Albumin/Globulin Ratio: 1.2 (ref 1.2–2.2)
Albumin: 3.6 g/dL — ABNORMAL LOW (ref 3.8–4.9)
Alkaline Phosphatase: 130 IU/L — ABNORMAL HIGH (ref 44–121)
BUN/Creatinine Ratio: 16 (ref 9–23)
BUN: 17 mg/dL (ref 6–24)
Bilirubin Total: <0.2 mg/dL (ref 0.0–1.2)
CO2: 22 mmol/L (ref 20–29)
Calcium: 9.3 mg/dL (ref 8.7–10.2)
Chloride: 109 mmol/L — ABNORMAL HIGH (ref 96–106)
Creatinine, Ser: 1.07 mg/dL — ABNORMAL HIGH (ref 0.57–1.00)
Globulin, Total: 3.1 g/dL (ref 1.5–4.5)
Glucose: 65 mg/dL (ref 65–99)
Potassium: 4.5 mmol/L (ref 3.5–5.2)
Sodium: 144 mmol/L (ref 134–144)
Total Protein: 6.7 g/dL (ref 6.0–8.5)
eGFR: 62 mL/min/{1.73_m2} (ref >59)

## 2020-03-25 ENCOUNTER — Telehealth: Payer: Self-pay | Admitting: *Deleted

## 2020-03-25 NOTE — Telephone Encounter (Signed)
-----   Message from Courtney Heys, MD sent at 03/25/2020 10:55 AM EDT ----- Can restart Diclofenac- left message saying labs look good- didn't say specifics, but asked her to call if needed specifics- thank you- ML

## 2020-03-25 NOTE — Progress Notes (Signed)
Can restart Diclofenac- left message saying labs look good- didn't say specifics, but asked her to call if needed specifics- thank you- ML

## 2020-03-25 NOTE — Telephone Encounter (Signed)
Notified. 

## 2020-03-26 ENCOUNTER — Other Ambulatory Visit: Payer: Self-pay

## 2020-03-26 ENCOUNTER — Ambulatory Visit
Admission: RE | Admit: 2020-03-26 | Discharge: 2020-03-26 | Disposition: A | Discharge status: Home / Self Care | Payer: Medicare HMO | Source: Ambulatory Visit | Attending: Physician Assistant | Admitting: Physician Assistant

## 2020-03-26 DIAGNOSIS — Z1231 Encounter for screening mammogram for malignant neoplasm of breast: Secondary | ICD-10-CM

## 2020-04-02 ENCOUNTER — Emergency Department (HOSPITAL_BASED_OUTPATIENT_CLINIC_OR_DEPARTMENT_OTHER): Payer: Medicare HMO

## 2020-04-02 ENCOUNTER — Encounter (HOSPITAL_BASED_OUTPATIENT_CLINIC_OR_DEPARTMENT_OTHER): Payer: Self-pay | Admitting: Emergency Medicine

## 2020-04-02 ENCOUNTER — Emergency Department (HOSPITAL_BASED_OUTPATIENT_CLINIC_OR_DEPARTMENT_OTHER)
Admission: EM | Admit: 2020-04-02 | Discharge: 2020-04-02 | Disposition: A | Discharge status: Home / Self Care | Payer: Medicare HMO | Attending: Emergency Medicine | Admitting: Emergency Medicine

## 2020-04-02 ENCOUNTER — Other Ambulatory Visit: Payer: Self-pay

## 2020-04-02 DIAGNOSIS — R110 Nausea: Secondary | ICD-10-CM | POA: Insufficient documentation

## 2020-04-02 DIAGNOSIS — Z794 Long term (current) use of insulin: Secondary | ICD-10-CM | POA: Diagnosis not present

## 2020-04-02 DIAGNOSIS — I5033 Acute on chronic diastolic (congestive) heart failure: Secondary | ICD-10-CM | POA: Insufficient documentation

## 2020-04-02 DIAGNOSIS — Z7722 Contact with and (suspected) exposure to environmental tobacco smoke (acute) (chronic): Secondary | ICD-10-CM | POA: Diagnosis not present

## 2020-04-02 DIAGNOSIS — Z79899 Other long term (current) drug therapy: Secondary | ICD-10-CM | POA: Insufficient documentation

## 2020-04-02 DIAGNOSIS — R079 Chest pain, unspecified: Secondary | ICD-10-CM

## 2020-04-02 DIAGNOSIS — E1140 Type 2 diabetes mellitus with diabetic neuropathy, unspecified: Secondary | ICD-10-CM | POA: Insufficient documentation

## 2020-04-02 DIAGNOSIS — Z8616 Personal history of COVID-19: Secondary | ICD-10-CM | POA: Insufficient documentation

## 2020-04-02 DIAGNOSIS — I110 Hypertensive heart disease with heart failure: Secondary | ICD-10-CM | POA: Insufficient documentation

## 2020-04-02 DIAGNOSIS — Z85238 Personal history of other malignant neoplasm of thymus: Secondary | ICD-10-CM | POA: Insufficient documentation

## 2020-04-02 DIAGNOSIS — Z7982 Long term (current) use of aspirin: Secondary | ICD-10-CM | POA: Insufficient documentation

## 2020-04-02 LAB — BRAIN NATRIURETIC PEPTIDE: B Natriuretic Peptide: 35.5 pg/mL (ref 0.0–100.0)

## 2020-04-02 LAB — COMPREHENSIVE METABOLIC PANEL
ALT: 35 U/L (ref 0–44)
AST: 29 U/L (ref 15–41)
Albumin: 3.3 g/dL — ABNORMAL LOW (ref 3.5–5.0)
Alkaline Phosphatase: 94 U/L (ref 38–126)
Anion gap: 10 (ref 5–15)
BUN: 18 mg/dL (ref 6–20)
CO2: 29 mmol/L (ref 22–32)
Calcium: 9.1 mg/dL (ref 8.9–10.3)
Chloride: 99 mmol/L (ref 98–111)
Creatinine, Ser: 1.36 mg/dL — ABNORMAL HIGH (ref 0.44–1.00)
GFR, Estimated: 47 mL/min — ABNORMAL LOW (ref >60)
Glucose, Bld: 182 mg/dL — ABNORMAL HIGH (ref 70–99)
Potassium: 3.9 mmol/L (ref 3.5–5.1)
Sodium: 138 mmol/L (ref 135–145)
Total Bilirubin: 0.4 mg/dL (ref 0.3–1.2)
Total Protein: 7 g/dL (ref 6.5–8.1)

## 2020-04-02 LAB — CBC WITH DIFFERENTIAL/PLATELET
Abs Immature Granulocytes: 0.02 10*3/uL (ref 0.00–0.07)
Basophils Absolute: 0 10*3/uL (ref 0.0–0.1)
Basophils Relative: 0 %
Eosinophils Absolute: 0 10*3/uL (ref 0.0–0.5)
Eosinophils Relative: 0 %
HCT: 41.4 % (ref 36.0–46.0)
Hemoglobin: 13.9 g/dL (ref 12.0–15.0)
Immature Granulocytes: 0 %
Lymphocytes Relative: 34 %
Lymphs Abs: 3 10*3/uL (ref 0.7–4.0)
MCH: 26.1 pg (ref 26.0–34.0)
MCHC: 33.6 g/dL (ref 30.0–36.0)
MCV: 77.8 fL — ABNORMAL LOW (ref 80.0–100.0)
Monocytes Absolute: 0.7 10*3/uL (ref 0.1–1.0)
Monocytes Relative: 8 %
Neutro Abs: 5.1 10*3/uL (ref 1.7–7.7)
Neutrophils Relative %: 58 %
Platelets: 258 10*3/uL (ref 150–400)
RBC: 5.32 MIL/uL — ABNORMAL HIGH (ref 3.87–5.11)
RDW: 15.9 % — ABNORMAL HIGH (ref 11.5–15.5)
WBC: 8.7 10*3/uL (ref 4.0–10.5)
nRBC: 0 % (ref 0.0–0.2)

## 2020-04-02 LAB — TROPONIN I (HIGH SENSITIVITY)
Troponin I (High Sensitivity): 9 ng/L (ref <18)
Troponin I (High Sensitivity): 8 ng/L (ref <18)

## 2020-04-02 LAB — CBG MONITORING, ED
Glucose-Capillary: 159 mg/dL — ABNORMAL HIGH (ref 70–99)
Glucose-Capillary: 100 mg/dL — ABNORMAL HIGH (ref 70–99)
Glucose-Capillary: 73 mg/dL (ref 70–99)

## 2020-04-02 LAB — LIPASE, BLOOD: Lipase: 28 U/L (ref 11–51)

## 2020-04-02 MED ORDER — LIDOCAINE VISCOUS HCL 2 % MT SOLN
15.0000 mL | Freq: Once | OROMUCOSAL | Status: AC
  Administered 2020-04-02: 15 mL via ORAL
  Filled 2020-04-02: qty 15

## 2020-04-02 MED ORDER — SODIUM CHLORIDE 0.9 % IV BOLUS
500.0000 mL | Freq: Once | INTRAVENOUS | Status: AC
  Administered 2020-04-02: 500 mL via INTRAVENOUS

## 2020-04-02 MED ORDER — ALUM & MAG HYDROXIDE-SIMETH 200-200-20 MG/5ML PO SUSP
30.0000 mL | Freq: Once | ORAL | Status: AC
  Administered 2020-04-02: 30 mL via ORAL
  Filled 2020-04-02: qty 30

## 2020-04-02 NOTE — Discharge Instructions (Signed)
Your work-up today was overall very reassuring.  Please make sure to follow-up with your primary care doctor.  Return to the ER for any new or worsening symptoms.

## 2020-04-02 NOTE — ED Provider Notes (Signed)
[facility] EMERGENCY DEPARTMENT Provider Note   CSN: [id number] Arrival date & time: 04/02/20  1230     History Chief Complaint  Patient presents with  . Chest Pain    Kaitlin [name] is a 55 y.o. female.  HPI 55 year old female with a history of CHF, DM type II, obesity, nonischemic cardiomyopathy presents to the ER with complaints of chest pain and nausea.  Patient states that her blood sugars have been low yesterday, states that she kept trying to eat foods to bring her blood sugars up, but it kept dropping.  She discontinued her insulin pump.  She then started to feel some chest tightness/pressure, and some nausea.  This has waxed and waned through the night and into the morning.  She has not taken anything for her symptoms.  States that at 1 point her heart rate did get high up to 120.  She states that her chest pressure/tightness is still present, but less than it was before.  She denies any cough, shortness of breath, pleuritic chest pain, radiation of the pain, dizziness, syncope, abdominal pain, leg swelling  Per chart review, patient with history of sternotomy for resection of a thymoma    Past Medical History:  Diagnosis Date  . Anemia   . Bronchitis   . Cataracts, bilateral 01/12/2018  . CHF (congestive heart failure) ([place])   . Diabetes mellitus   . High cholesterol   . Hypertension    takes medicine to protect kidneys, does not have HTN  . Leukocytosis   . Morbid obesity ([place]) 09/11/2011   BMI 57  . Neuropathy 01/12/2018  . NICM (nonischemic cardiomyopathy) ([place]) 05/01/2013   Overview:  Last Assessment & Plan:  euvolemic on physical exam.   . Obesity   . Sickle cell trait ([place])   . Thymoma     Patient Active Problem List   Diagnosis Date Noted  . Long COVID 01/02/2020  . Mixed stress and urge urinary incontinence 07/04/2019  . Nerve pain 05/16/2019  . Myofascial pain 05/16/2019  . Subluxation of acromioclavicular joint, right, sequela 03/23/2019   . Sickle cell trait ([place])   . Labile blood glucose   . Diabetic peripheral neuropathy ([place])   . Hypokalemia   . Pain in joint of left shoulder   . Debility   . 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 COVID-19   . History of anemia   . Super obese   . Poorly controlled type 2 diabetes mellitus with peripheral neuropathy ([place])   . Acute on chronic diastolic (congestive) heart failure ([place])   . Hypernatremia   . Shock circulatory ([place])   . Septic shock ([place])   . Hypotension 01/13/2019  . Acute respiratory failure with hypoxemia ([facility]) 01/13/2019  . Acute respiratory distress syndrome (ARDS) due to COVID-19 virus ([place]) 01/12/2019  . Grade I diastolic dysfunction 74/08/1446  . 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 Dyspnea on exertion 05/19/2016  . Other fatigue 05/19/2016  . Absolute anemia 04/15/2016  . Left leg weakness 10/18/2014  . Left leg paresthesias 10/18/2014  . Acute left-sided weakness 10/18/2014  . Encounter for central line placement 03/08/2014  . Taking drug for chronic disease 03/08/2014  . H/O insertion of insulin pump 02/18/2014  . Long term current use of insulin ([place]) 02/18/2014  . Insulin pump in place 02/18/2014  . 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 Abnormal ballistocardiogram 05/18/2013  . Abnormal nuclear stress test 05/18/2013  . 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 NICM (nonischemic  cardiomyopathy) ([place]) 05/01/2013  . Insulin dependent type 2 diabetes mellitus ([place]) 04/10/2013  . Sex counseling 04/10/2013  . Edema leg 04/10/2013  . Upper respiratory infection, viral 04/10/2013  . Pedal edema 03/29/2013  . Pneumonia 12/12/2012  . Essential hypertension 12/12/2012  . HLD (hyperlipidemia) 12/12/2012  . Tonsillar mass 12/01/2012  . 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 Leukocytosis   . Anemia   . High cholesterol   . Hypertension   . Blurred vision 09/11/2011  . Foot pain 09/11/2011  . Breast pain 09/11/2011  .  Morbid obesity ([place]) 09/11/2011  . Fungal infection of nail 09/11/2011  . Avitaminosis D 09/11/2011    Past Surgical History:  Procedure Laterality Date  . COLONOSCOPY N/A 06/21/2017   Procedure: COLONOSCOPY;  Surgeon: Ronnette Juniper, MD;  Location: [facility];  Service: Gastroenterology;  Laterality: N/A;  . LEFT HEART CATHETERIZATION WITH CORONARY ANGIOGRAM N/A 05/25/2013   Procedure: LEFT HEART CATHETERIZATION WITH CORONARY ANGIOGRAM;  Surgeon: Troy Sine, MD;  Location: [facility];  Service: Cardiovascular;  Laterality: N/A;  . LYMPH NODE BIOPSY Right 11/30/2012   Procedure: RIGHT TONSIL BIOPSY WITH FRESH FROZEN ANALYSIS;  Surgeon: Jodi Marble, MD;  Location: [facility];  Service: ENT;  Laterality: Right;  . MEDIASTERNOTOMY N/A 01/12/2013   Procedure: MEDIAN STERNOTOMY;  Surgeon: Gaye Pollack, MD;  Location: [facility];  Service: Thoracic;  Laterality: N/A;  . MEDIASTINOTOMY CHAMBERLAIN MCNEIL Right 11/06/2012   Procedure: MEDIASTINOTOMY CHAMBERLAIN MCNEILPROCEDURE;  Surgeon: Gaye Pollack, MD;  Location: [facility];  Service: Thoracic;  Laterality: Right;  . POLYPECTOMY  06/21/2017   Procedure: POLYPECTOMY;  Surgeon: Ronnette Juniper, MD;  Location: [facility];  Service: Gastroenterology;;  . RESECTION OF A THYMOMA N/A 01/12/2013   Procedure: RESECTION OF A THYMOMA;  Surgeon: Gaye Pollack, MD;  Location: [facility];  Service: Thoracic;  Laterality: N/A;  . TONSILLECTOMY    . TUBAL LIGATION       OB History   No obstetric history on file.     Family History  Problem Relation Age of Onset  . Heart disease Father        No details  . Diabetes type II Other        Family HX  . Breast cancer Other   . Breast cancer Maternal Grandmother     Social History   Tobacco Use  . Smoking status: Passive Smoke Exposure - Never Smoker  . Smokeless tobacco: Never Used  Vaping Use  . Vaping Use: Never used  Substance Use Topics  . Alcohol use: No  . Drug use: No    Home Medications Prior to  Admission medications   Medication Sig Start Date End Date Taking? Authorizing Provider  acetaminophen (TYLENOL) 160 MG/5ML solution Take 20.3 mLs (650 mg total) by mouth every 6 (six) hours as needed for mild pain or moderate pain. 03/09/19   Angiulli, Lavon Paganini, PA-C  albuterol (VENTOLIN HFA) 108 (90 Base) MCG/ACT inhaler One puff every 6 hour as needed 03/09/19   Angiulli, Lavon Paganini, PA-C  ascorbic acid (VITAMIN C) 500 MG tablet Take 1,500 mg by mouth daily.    [provider]  aspirin EC 81 MG tablet Take 1 tablet (81 mg total) by mouth daily. 05/18/16   Minus Breeding, MD  calcium-vitamin D (OSCAL WITH D) 500-200 MG-UNIT tablet Take 2 tablets by mouth daily with breakfast. 03/09/19   Angiulli, Lavon Paganini, PA-C  carvedilol (COREG) 6.25 MG tablet Take  1 tablet (6.25 mg total) by mouth 2 (two) times daily with a meal. 03/09/19   Angiulli, Lavon Paganini, PA-C  Cholecalciferol (VITAMIN D-3) 125 MCG (5000 UT) TABS Take 5,000 Units by mouth daily. 03/09/19   Angiulli, Lavon Paganini, PA-C  diclofenac (VOLTAREN) 50 MG EC tablet TAKE 1 TABLET(50 MG) BY MOUTH DAILY- for arthritis 03/21/20   Lovorn, Jinny Blossom, MD  diclofenac Sodium (VOLTAREN) 1 % GEL Apply topically. 03/09/19   [provider]  FEROSUL 325 (65 Fe) MG tablet TAKE 1 TABLET(325 MG) BY MOUTH DAILY WITH BREAKFAST 12/17/19   Lovorn, Jinny Blossom, MD  fexofenadine (ALLEGRA) 180 MG tablet Take 180 mg by mouth daily.    [provider]  Insulin Human (INSULIN PUMP) SOLN Inject into the skin. Novolog    [provider]  ketoconazole (NIZORAL) 2 % cream Apply 1 application topically daily. 05/02/19   [provider]  lidocaine (LIDODERM) 5 % Place 1 patch onto the skin daily. Remove & Discard patch within 12 hours or as directed by MD 03/09/19   Angiulli, Lavon Paganini, PA-C  lisinopril (ZESTRIL) 5 MG tablet Take 5 mg by mouth daily.    [provider]  magnesium oxide (MAG-OX) 400 (241.3 Mg) MG tablet Take 1 tablet (400 mg total) by  mouth 2 (two) times daily. Patient taking differently: Take 400 mg by mouth daily. 03/09/19   Angiulli, Lavon Paganini, PA-C  Multiple Vitamin (MULTIVITAMIN WITH MINERALS) TABS tablet Take 1 tablet by mouth daily.    [provider]  Multiple Vitamins-Minerals (PRESERVISION AREDS) TABS Take 1 tablet by mouth 2 (two) times daily.    [provider]  NOVOLOG 100 UNIT/ML injection Inject into the skin. 09/19/19   [provider]  potassium chloride SA (KLOR-CON) 20 MEQ tablet Take 20 mEq by mouth 2 (two) times daily.    [provider]  rosuvastatin (CRESTOR) 40 MG tablet Take 1 tablet (40 mg total) by mouth daily. 03/09/19   Angiulli, Lavon Paganini, PA-C  Semaglutide,0.25 or 0.5MG /DOS, (OZEMPIC, 0.25 OR 0.5 MG/DOSE,) 2 MG/1.5ML SOPN Inject 0.25 mg into the skin every 7 (seven) days.    [provider]  torsemide (DEMADEX) 20 MG tablet Take 1 tablet (20 mg total) by mouth 2 (two) times daily. 03/09/19   Angiulli, Lavon Paganini, PA-C    Allergies    Ibuprofen and Amoxicillin-pot clavulanate  Review of Systems   Review of Systems  Constitutional: Negative for chills and fever.  HENT: Negative for ear pain and sore throat.   Eyes: Negative for pain and visual disturbance.  Respiratory: Negative for cough and shortness of breath.   Cardiovascular: Positive for chest pain. Negative for palpitations.  Gastrointestinal: Positive for nausea. Negative for abdominal pain and vomiting.  Genitourinary: Negative for dysuria and hematuria.  Musculoskeletal: Negative for arthralgias and back pain.  Skin: Negative for color change and rash.  Neurological: Negative for seizures and syncope.  All other systems reviewed and are negative.   Physical Exam Updated Vital Signs BP 110/81   Pulse 83   Temp 98.7 F (37.1 C) (Oral)   Resp 17   Ht 5\' 9"  (1.753 m)   Wt (!) 176 kg   LMP 06/17/2012   SpO2 98%   BMI 57.30 kg/m   Physical Exam Vitals and nursing note reviewed.   Constitutional:      General: She is not in acute distress.    Appearance: She is well-developed. She is not ill-appearing, toxic-appearing or diaphoretic.  HENT:  Head: Normocephalic and atraumatic.  Eyes:     Conjunctiva/sclera: Conjunctivae normal.  Cardiovascular:     Rate and Rhythm: Normal rate and regular rhythm.     Pulses:          Radial pulses are 2+ on the right side and 2+ on the left side.     Heart sounds: No murmur heard.   Pulmonary:     Effort: Pulmonary effort is normal. No respiratory distress.     Breath sounds: Normal breath sounds. No decreased breath sounds, wheezing, rhonchi or rales.  Abdominal:     Palpations: Abdomen is soft.     Tenderness: There is no abdominal tenderness.  Musculoskeletal:        General: Normal range of motion.     Cervical back: Neck supple.     Right lower leg: No edema.     Left lower leg: No edema.  Skin:    General: Skin is warm and dry.  Neurological:     General: No focal deficit present.     Mental Status: She is alert.  Psychiatric:        Mood and Affect: Mood normal.        Behavior: Behavior normal.     ED Results / Procedures / Treatments   Labs (all labs ordered are listed, but only abnormal results are displayed) Labs Reviewed  CBC WITH DIFFERENTIAL/PLATELET - Abnormal; Notable for the following components:      Result Value   RBC 5.32 (*)    MCV 77.8 (*)    RDW 15.9 (*)    All other components within normal limits  COMPREHENSIVE METABOLIC PANEL - Abnormal; Notable for the following components:   Glucose, Bld 182 (*)    Creatinine, Ser 1.36 (*)    Albumin 3.3 (*)    GFR, Estimated 47 (*)    All other components within normal limits  CBG MONITORING, ED - Abnormal; Notable for the following components:   Glucose-Capillary 159 (*)    All other components within normal limits  CBG MONITORING, ED - Abnormal; Notable for the following components:   Glucose-Capillary 100 (*)    All other components  within normal limits  LIPASE, BLOOD  BRAIN NATRIURETIC PEPTIDE  CBG MONITORING, ED  TROPONIN I (HIGH SENSITIVITY)  TROPONIN I (HIGH SENSITIVITY)    EKG EKG Interpretation  Date/Time:  Wednesday April 02 2020 12:40:46 EDT Ventricular Rate:  97 PR Interval:    QRS Duration: 101 QT Interval:  354 QTC Calculation: 450 R Axis:   -39 Text Interpretation: Sinus rhythm Probable left atrial enlargement Low voltage, precordial leads Left ventricular hypertrophy No significant change since last tracing Confirmed by Lennice Sites [phone]) on 04/02/2020 12:43:56 PM   Radiology DG Chest Portable 1 View  Result Date: 04/02/2020 CLINICAL DATA:  Chest pain. EXAM: PORTABLE CHEST 1 VIEW COMPARISON:  02/03/2019. FINDINGS: Prior median sternotomy. Heart size normal. Low lung volumes. Interim near complete clearing of previously identified bilateral pulmonary infiltrates. Stable elevation left hemidiaphragm. No pleural effusion or pneumothorax. IMPRESSION: 1. Interim near complete clearing of previously identified bilateral pulmonary infiltrates. Elevation left hemidiaphragm. 2.  Prior median sternotomy.  Heart size normal. Electronically Signed   By: Marcello Moores  Register   On: 04/02/2020 13:20    Procedures Procedures   Medications Ordered in ED Medications  alum & mag hydroxide-simeth (MAALOX/MYLANTA) 200-200-20 MG/5ML suspension 30 mL (30 mLs Oral Given 04/02/20 1311)    And  lidocaine (XYLOCAINE) 2 % viscous mouth solution  15 mL (15 mLs Oral Given 04/02/20 1311)  sodium chloride 0.9 % bolus 500 mL (0 mLs Intravenous Stopped 04/02/20 1649)    ED Course  I have reviewed the triage vital signs and the nursing notes.  Pertinent labs & imaging results that were available during my care of the patient were reviewed by me and considered in my medical decision making (see chart for details).    MDM Rules/Calculators/A&P                          55 year old female who presents with chest pain and nausea.   On arrival, she is well-appearing, no acute distress, resting comfortably in the ER bed, nondiaphoretic, and no acute respiratory distress.  Vitals on arrival with a blood pressure 102/68, however afebrile, not tachycardic and tachypneic or hypoxic.  Heart rate of 100, borderline.  Physical exam unremarkable, no evidence of lower extremity edema, lung sounds clear.  The emergent causes of chest pain include: Acute coronary syndrome, tamponade, pericarditis/myocarditis, aortic dissection, pulmonary embolism, tension pneumothorax, pneumonia, and esophageal rupture.   I do not believe the patient has an emergent cause of chest pain, other urgent/non-acute considerations include, but are not limited to: chronic angina, aortic stenosis, cardiomyopathy, mitral valve prolapse, pulmonary hypertension, aortic insufficiency, right ventricular hypertrophy, pleuritis, bronchitis, pneumothorax, tumor, gastroesophageal reflux disease (GERD), esophageal spasm, Mallory-Weiss syndrome, peptic ulcer disease, pancreatitis, functional gastrointestinal pain, cervical or thoracic disk disease or arthritis, shoulder arthritis, costochondritis, subacromial bursitis, anxiety or panic attack, herpes zoster, breast disorders, chest wall tumors, thoracic outlet syndrome, mediastinitis.  Labs and imaging ordered, reviewed and interpreted by me.  CBC unremarkable, CMP with a slightly elevated creatinine of 1.36 from her baseline of 1.1.  BNP is normal.  Lipase normal.  Initial troponin of 9.  Chest x-ray was unremarkable.  EKG with no significant changes from prior, no evidence of ischemia.  Patient was given GI cocktail.  Pending second troponin, suspect the patient will be stable for discharge with follow-up with PCP  Repeat troponin normal.  Low suspicion for ACS at this time, low suspicion for dissection, PE.  Patient remained slightly hypotensive with a blood pressure of 98/63.  No evidence of sepsis at this time.  Patient  states that her blood pressure can drop sometimes, and she recently went down on her dose of carvedilol secondary to this.  Will give 500 cc bolus of fluids.  Suspect she will be stable for discharge afterwards   Blood pressure improved after fluids to 110/80 we discussed follow-up with PCP, and return precautions.  She voiced understanding is agreeable.  Stable for discharge  Discussed with Dr. Ronnald Nian who is agreeable to the plan disposition Final Clinical Impression(s) / ED Diagnoses Final diagnoses:  Chest pain, unspecified type    Rx / DC Orders ED Discharge Orders    None       Garald Balding, PA-C 04/02/20 [place], [place], DO 04/03/20 0710

## 2020-04-02 NOTE — ED Triage Notes (Signed)
Patient reports her Blood sugar was low a few days ago.  Had to stop insulin pump to get it back up.  Since c/o nausea and chest pressure/tightness.  Also reports her HR got up to 120.

## 2020-04-12 ENCOUNTER — Other Ambulatory Visit: Payer: Self-pay | Admitting: Physical Medicine and Rehabilitation

## 2020-05-16 ENCOUNTER — Encounter: Payer: Medicare Other | Admitting: Physical Medicine and Rehabilitation

## 2020-06-04 ENCOUNTER — Encounter: Payer: Self-pay | Admitting: Oncology

## 2020-06-20 ENCOUNTER — Other Ambulatory Visit: Payer: Self-pay

## 2020-06-20 ENCOUNTER — Encounter: Payer: Self-pay | Admitting: Physical Medicine and Rehabilitation

## 2020-06-20 ENCOUNTER — Encounter
Payer: Medicare Other | Attending: Physical Medicine and Rehabilitation | Admitting: Physical Medicine and Rehabilitation

## 2020-06-20 ENCOUNTER — Telehealth: Payer: Self-pay | Admitting: *Deleted

## 2020-06-20 VITALS — BP 97/68 | HR 97 | Temp 98.7°F | Ht 69.0 in | Wt 383.8 lb

## 2020-06-20 DIAGNOSIS — M7918 Myalgia, other site: Secondary | ICD-10-CM

## 2020-06-20 DIAGNOSIS — N3946 Mixed incontinence: Secondary | ICD-10-CM

## 2020-06-20 DIAGNOSIS — U099 Post covid-19 condition, unspecified: Secondary | ICD-10-CM | POA: Diagnosis not present

## 2020-06-20 NOTE — Patient Instructions (Signed)
Plan: [facility]- for aquatic therapy eval and treat PT- for LONG COVID.  Patient here for trigger point injections for myofascial pain  Consent done and on chart.  Cleaned areas with alcohol and injected using a 27 gauge 1.5 inch needle  Injected 8cc Using 1% Lidocaine with no EPI  Upper traps B/L  Levators B/L Posterior scalenes B/L  Middle scalenes Splenius Capitus B/L  Pectoralis Major B/L Rhomboids B/L x3 Infraspinatus Teres Major/minor Thoracic paraspinals B/L  Lumbar paraspinals Other injections- B/L deltoids and triceps   Patient's level of pain prior was 5/10 Current level of pain after injections is- still a 5/10- usually better by the time she gets into car.   There was no bleeding or complications.  Patient was advised to drink a lot of water on day after injections to flush system Will have increased soreness for 12-48 hours after injections.  Can use Lidocaine patches the day AFTER injections Can use theracane on day of injections in places didn't inject Can use heating pad 4-6 hours AFTER injections  34. Pt still has stress and urge incontinence- am writing for incontinence products for bariatric patients.  Only taking Torsemide 1x/day- because gets dehydrated.  Suggest speaking to PCP about this. Drinking 6 cups of water/day  f/U in 69months

## 2020-06-20 NOTE — Progress Notes (Signed)
Order faxed to new company [employer] for incontinence supplies.

## 2020-06-20 NOTE — Telephone Encounter (Signed)
Kaitlin [name] from [place] called to say that the order for the incontinence supplies is missing the correct IDC 10 codes on the order and in the clinical note.  ICD 10  N39.46 is not a correct primary incontinence ICD 10 code.  The date of onset is missing in clinical note as well.  The physician will need to initial and date the corrections to the order.  Fax # [phone].

## 2020-06-20 NOTE — Progress Notes (Signed)
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 Still has severe difficulty with endurance due to ICU myopathy.      F/U for trigger point injections.    Assessment: Has macular degeneration as new dx- also long COVID- stress/urge incontinence and BMI of 56 Plan: [facility]- for aquatic therapy eval and treat PT- for LONG COVID.  Patient here for trigger point injections for myofascial pain  Consent done and on chart.  Cleaned areas with alcohol and injected using a 27 gauge 1.5 inch needle  Injected 8cc Using 1% Lidocaine with no EPI  Upper traps B/L  Levators B/L Posterior scalenes B/L  Middle scalenes Splenius Capitus B/L  Pectoralis Major B/L Rhomboids B/L x3 Infraspinatus Teres Major/minor Thoracic paraspinals B/L  Lumbar paraspinals Other injections- B/L deltoids and triceps   Patient's level of pain prior was 5/10 Current level of pain after injections is- still a 5/10- usually better by the time she gets into car.   There was no bleeding or complications.  Patient was advised to drink a lot of water on day after injections to flush system Will have increased soreness for 12-48 hours after injections.  Can use Lidocaine patches the day AFTER injections Can use theracane on day of injections in places didn't inject Can use heating pad 4-6 hours AFTER injections  34. Pt still has stress and urge incontinence- am writing for incontinence products for bariatric patients.  Only taking Torsemide 1x/day- because gets dehydrated.  Suggest speaking to PCP about this. Drinking 6 cups of water/day  f/U in 81months

## 2020-06-25 ENCOUNTER — Ambulatory Visit (HOSPITAL_BASED_OUTPATIENT_CLINIC_OR_DEPARTMENT_OTHER): Payer: Medicare Other | Attending: Physical Medicine and Rehabilitation | Admitting: Physical Therapy

## 2020-06-25 ENCOUNTER — Other Ambulatory Visit: Payer: Self-pay

## 2020-06-25 DIAGNOSIS — M6281 Muscle weakness (generalized): Secondary | ICD-10-CM | POA: Diagnosis not present

## 2020-06-25 DIAGNOSIS — R2681 Unsteadiness on feet: Secondary | ICD-10-CM | POA: Diagnosis present

## 2020-06-25 DIAGNOSIS — R2689 Other abnormalities of gait and mobility: Secondary | ICD-10-CM | POA: Insufficient documentation

## 2020-06-25 NOTE — Addendum Note (Signed)
Addended by: Colbert Ewing MARIE L on: 06/25/2020 02:56 PM   Modules accepted: Orders

## 2020-06-25 NOTE — Therapy (Signed)
[facility] [address] [place], [place], [address] Phone: [phone]   Fax:  [phone]  Physical Therapy Evaluation  Patient Details  Name: Kaitlin [name] MRN: [id number] Date of Birth: 01/24/1965 Referring Provider (PT): Courtney Heys, MD   Encounter Date: 06/25/2020   PT End of Session - 06/25/20 1347     Visit Number 1    Number of Visits 13    Date for PT Re-Evaluation 08/06/20    Authorization Type UHC Medicare    PT Start Time 1347    PT Stop Time 1430    PT Time Calculation (min) 43 min    Activity Tolerance Patient limited by fatigue;Patient tolerated treatment well    Behavior During Therapy [facility] for tasks assessed/performed             Past Medical History:  Diagnosis Date   Anemia    Bronchitis    Cataracts, bilateral 01/12/2018   CHF (congestive heart failure) ([place])    Diabetes mellitus    High cholesterol    Hypertension    takes medicine to protect kidneys, does not have HTN   Leukocytosis    Morbid obesity ([place]) 09/11/2011   BMI 57   Neuropathy 01/12/2018   NICM (nonischemic cardiomyopathy) ([place]) 05/01/2013   Overview:  Last Assessment & Plan:  euvolemic on physical exam.    Obesity    Sickle cell trait ([place])    Thymoma     Past Surgical History:  Procedure Laterality Date   COLONOSCOPY N/A 06/21/2017   Procedure: COLONOSCOPY;  Surgeon: Ronnette Juniper, MD;  Location: [facility];  Service: Gastroenterology;  Laterality: N/A;   LEFT HEART CATHETERIZATION WITH CORONARY ANGIOGRAM N/A 05/25/2013   Procedure: LEFT HEART CATHETERIZATION WITH CORONARY ANGIOGRAM;  Surgeon: Troy Sine, MD;  Location: [facility] CATH LAB;  Service: Cardiovascular;  Laterality: N/A;   LYMPH NODE BIOPSY Right 11/30/2012   Procedure: RIGHT TONSIL BIOPSY WITH FRESH FROZEN ANALYSIS;  Surgeon: Jodi Marble, MD;  Location: [facility];  Service: ENT;  Laterality: Right;   MEDIASTERNOTOMY N/A 01/12/2013   Procedure: MEDIAN STERNOTOMY;   Surgeon: Gaye Pollack, MD;  Location: [facility];  Service: Thoracic;  Laterality: N/A;   MEDIASTINOTOMY CHAMBERLAIN MCNEIL    Right 11/06/2012   Procedure: MEDIASTINOTOMY CHAMBERLAIN MCNEIL PROCEDURE;  Surgeon: Gaye Pollack, MD;  Location: [facility];  Service: Thoracic;  Laterality: Right;   POLYPECTOMY  06/21/2017   Procedure: POLYPECTOMY;  Surgeon: Ronnette Juniper, MD;  Location: [facility];  Service: Gastroenterology;;   RESECTION OF A THYMOMA N/A 01/12/2013   Procedure: RESECTION OF A THYMOMA;  Surgeon: Gaye Pollack, MD;  [facility];  Service: Thoracic;  Laterality: N/A;   TONSILLECTOMY     TUBAL LIGATION      There were no vitals filed for this visit.    Subjective Assessment - 06/25/20 1351     Subjective Pt reports she caught COVID last year and when she came out "I was a vegetable." She reports she got better but has not fully gotten better. She states her fatigue and strength was the most affected. Pt requesting aquatic therapy. Pt states she did regular therapy but has still not regained full strength yet. Pt with generalized pain.    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 2-3 minutes    How long can you walk comfortably? 150' approx. back into  therapy gym    Patient Stated Goals wants to be able to walk normal and not feel like she is going to topple over. standing up is difficult    Currently in Pain? Yes    Pain Score 5     Pain Location Generalized    Pain Descriptors / Indicators Aching;Burning   "sometimes burning in my arms"   Pain Type Chronic pain    Pain Onset More than a month ago                [facility] PT Assessment - 06/25/20 0001       Assessment   Medical Diagnosis Long COVID.    Referring Provider (PT) Lovorn, Jinny Blossom, MD      Precautions   Precautions None      Restrictions   Weight Bearing Restrictions No      Balance Screen   Has the  patient fallen in the past 6 months No      [place] residence    Living Arrangements Other (Comment)   Son, daughter, grandson   Available Help at Discharge Family    Type of [place] to enter    Entrance Stairs-Number of Steps 18    Entrance Stairs-Rails Can reach both      Prior Function   Level of [place], grandchildren      Observation/Other Assessments   Focus on Therapeutic Outcomes (FOTO)  n/a      Functional Tests   Functional tests Sit to Stand      AROM   Overall AROM Comments L shoulder flexion 110; R shoulder flexion 130; bilat shoulder abd 110 deg      Strength   Overall Strength Comments Bilat LE grossly 4/5, UEs grossly 4/5    Strength Assessment Site --   195 lbs 1 RM with cybex leg press (90 lbs x 35 reps)     6 Minute Walk- Baseline   Modified Borg Scale for Dyspnea 3- Moderate shortness of breath or breathing difficulty      6 Minute walk- Post Test   6 Minute Walk Post Test no   2 minute walk   HR (bpm) 105    02 Sat (%RA) 96 %    Modified Borg Scale for Dyspnea 4- somewhat severe      6 minute walk test results    Aerobic Endurance Distance Walked 246      Standardized Balance Assessment   Standardized Balance Assessment Five Times Sit to Stand    Five times sit to stand comments  16 sec                        Objective measurements completed on examination: See above findings.                 PT Short Term Goals - 06/25/20 1445       PT SHORT TERM GOAL #1   Title Pt will be independent with initial HEP    Time 3    Period Weeks    Status New    Target Date 07/16/20      PT SHORT TERM GOAL #2   Title Pt will be ambulating 2 minutes at least 3x in the day for home walking program    Baseline Not currently doing any formal walking  program    Time 3    Period Weeks    Status New     Target Date 07/16/20      PT SHORT TERM GOAL #3   Title Pt will demo improved double leg press strength with increased 1 RM to 220 lbs    Baseline 195 lbs 1 RM on cybex leg press    Time 3    Period Weeks    Status New    Target Date 07/16/20               PT Long Term Goals - 06/25/20 1447       PT LONG TERM GOAL #1   Title Pt will be independent with continuing to self progress her exercises at home    Time 6    Period Weeks    Status New    Target Date 08/06/20      PT LONG TERM GOAL #2   Title Pt will improve 2 MWT to at least 300' to demo improved walking endurance    Baseline 246' on 06/25/20    Time 6    Period Weeks    Status New    Target Date 08/06/20      PT LONG TERM GOAL #3   Title Pt will demo 5x STS of <13 sec to demo improved functional LE strength    Baseline 16 sec    Time 6    Period Weeks    Status New    Target Date 08/06/20                    Plan - 06/25/20 1436     Clinical Impression Statement Ms. Pegah Segel is a 55 y/o F presenting to OPPT due to complaints of continued weakness and decreased endurance. Pt has been previously seen at Neuro clinic in Dec 2021. PMH significant for COVID with ICU myopathy after hospitalization and continued post-COVID symptoms. On assessment, pt demos decreased strength to weight ratio (1 rep max 195 lbs on Cybex leg press; current weight ~383 lbs), decreased bilat UE ROM, generalized pain, and poor endurance based on age norms for 2 MWT. Pt would benefit from PT for gross strengthening and endurance to optimize home and community tasks.    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, CHF, diabetes mellitus, HTN, neuropathy, obesity    Examination-Activity Limitations Stand;Sit;Transfers;Stairs;Squat;Locomotion Level    Examination-Participation Restrictions  Church;Community Activity    Stability/Clinical Decision Making Evolving/Moderate complexity    Rehab Potential Good    PT Frequency 2x / week    PT Duration 4 weeks    PT Treatment/Interventions ADLs/Self Care Home Management;DME Instruction;Gait training;Stair training;Functional mobility training;Therapeutic activities;Balance training;Therapeutic exercise;Neuromuscular re-education;Patient/family education;Energy conservation    PT Next Visit Plan On land: Initiate machine strengthening and gross strength/conditioning. Start cybex leg press 97 to 120 lbs for 50-60% of her 1 RM. Consider obtaining 1 RM for UEs. Work on endurance. Aquatics: Work on Futures trader.    [place] next session; limited due to pt fatigue on eval    Consulted and Agree with Plan of Care Patient             Patient will benefit from skilled therapeutic intervention in order to improve the following deficits and impairments:  Abnormal gait, Cardiopulmonary status limiting activity, Decreased activity tolerance, Decreased balance, Decreased coordination, Decreased  endurance, Decreased mobility, Difficulty walking, Decreased strength, Impaired sensation, Postural dysfunction, Obesity, Impaired UE functional use, Decreased range of motion, Pain  Visit Diagnosis: Muscle weakness (generalized)  Unsteadiness on feet  Other abnormalities of gait and mobility     Problem List Patient Active Problem List   Diagnosis Date Noted   Long COVID 01/02/2020   Mixed stress and urge urinary incontinence 07/04/2019   Nerve pain 05/16/2019   Myofascial pain 05/16/2019   Subluxation of acromioclavicular joint, right, sequela 03/23/2019   Sickle cell trait ([place])    Labile blood glucose    Diabetic peripheral neuropathy (HCC)    Hypokalemia    Pain in joint of left shoulder    Debility    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   COVID-19    History of anemia    Super  obese    Poorly controlled type 2 diabetes mellitus with peripheral neuropathy (HCC)    Acute on chronic diastolic (congestive) heart failure (HCC)    Hypernatremia    Shock circulatory (HCC)    Septic shock (HCC)    Hypotension 01/13/2019   Acute respiratory failure with hypoxemia ([place]) 01/13/2019   Acute respiratory distress syndrome (ARDS) due to COVID-19 virus ([place]) 01/12/2019   Grade I diastolic dysfunction 15/05/6977   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   Dyspnea on exertion 05/19/2016   Other fatigue 05/19/2016   Absolute anemia 04/15/2016   Left leg weakness 10/18/2014   Left leg paresthesias 10/18/2014   Acute left-sided weakness 10/18/2014   Encounter for central line placement 03/08/2014   Taking drug for chronic disease 03/08/2014   H/O insertion of insulin pump 02/18/2014   Long term current use of insulin ([place]) 02/18/2014   Insulin pump in place 02/18/2014   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   Abnormal ballistocardiogram 05/18/2013   Abnormal nuclear stress test 05/18/2013   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   NICM (nonischemic cardiomyopathy) ([place]) 05/01/2013   Insulin dependent type 2 diabetes mellitus ([place]) 04/10/2013   Sex counseling 04/10/2013   Edema leg 04/10/2013   Upper respiratory infection, viral 04/10/2013   Pedal edema 03/29/2013   Pneumonia 12/12/2012   Essential hypertension 12/12/2012   HLD (hyperlipidemia) 12/12/2012   Tonsillar mass 12/01/2012   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

## 2020-07-07 ENCOUNTER — Ambulatory Visit (HOSPITAL_BASED_OUTPATIENT_CLINIC_OR_DEPARTMENT_OTHER): Payer: Medicare Other | Admitting: Physical Therapy

## 2020-07-11 ENCOUNTER — Ambulatory Visit (HOSPITAL_BASED_OUTPATIENT_CLINIC_OR_DEPARTMENT_OTHER): Payer: Medicare Other | Attending: Physical Medicine and Rehabilitation | Admitting: Physical Therapy

## 2020-07-11 ENCOUNTER — Other Ambulatory Visit: Payer: Self-pay

## 2020-07-11 ENCOUNTER — Encounter (HOSPITAL_BASED_OUTPATIENT_CLINIC_OR_DEPARTMENT_OTHER): Payer: Self-pay | Admitting: Physical Therapy

## 2020-07-11 DIAGNOSIS — M6281 Muscle weakness (generalized): Secondary | ICD-10-CM | POA: Diagnosis not present

## 2020-07-11 DIAGNOSIS — M79602 Pain in left arm: Secondary | ICD-10-CM | POA: Diagnosis present

## 2020-07-11 DIAGNOSIS — R2681 Unsteadiness on feet: Secondary | ICD-10-CM | POA: Diagnosis present

## 2020-07-11 DIAGNOSIS — M79601 Pain in right arm: Secondary | ICD-10-CM | POA: Insufficient documentation

## 2020-07-11 DIAGNOSIS — M25612 Stiffness of left shoulder, not elsewhere classified: Secondary | ICD-10-CM | POA: Diagnosis present

## 2020-07-11 DIAGNOSIS — G7281 Critical illness myopathy: Secondary | ICD-10-CM | POA: Diagnosis present

## 2020-07-11 DIAGNOSIS — R262 Difficulty in walking, not elsewhere classified: Secondary | ICD-10-CM

## 2020-07-11 DIAGNOSIS — R41840 Attention and concentration deficit: Secondary | ICD-10-CM | POA: Insufficient documentation

## 2020-07-11 DIAGNOSIS — R2689 Other abnormalities of gait and mobility: Secondary | ICD-10-CM | POA: Diagnosis present

## 2020-07-11 NOTE — Therapy (Signed)
[facility] [address] [place], [place], [address] Phone: [phone]   Fax:  [phone]  Physical Therapy Treatment  Patient Details  Name: Kaitlin [name] MRN: [id number] Date of Birth: 09-Apr-1965 Referring Provider (PT): Courtney Heys, MD   Encounter Date: 07/11/2020   PT End of Session - 07/11/20 1130     Visit Number 2    Number of Visits 13    Date for PT Re-Evaluation 08/06/20    Authorization Type UHC Medicare    PT Start Time 1022    PT Stop Time 1105    PT Time Calculation (min) 43 min    Activity Tolerance Patient tolerated treatment well    Behavior During Therapy [facility] for tasks assessed/performed             Past Medical History:  Diagnosis Date   Anemia    Bronchitis    Cataracts, bilateral 01/12/2018   CHF (congestive heart failure) ([place])    Diabetes mellitus    High cholesterol    Hypertension    takes medicine to protect kidneys, does not have HTN   Leukocytosis    Morbid obesity ([place]) 09/11/2011   BMI 57   Neuropathy 01/12/2018   NICM (nonischemic cardiomyopathy) ([place]) 05/01/2013   Overview:  Last Assessment & Plan:  euvolemic on physical exam.    Obesity    Sickle cell trait ([place])    Thymoma     Past Surgical History:  Procedure Laterality Date   COLONOSCOPY N/A 06/21/2017   Procedure: COLONOSCOPY;  Surgeon: Ronnette Juniper, MD;  [facility]: [facility];  Service: Gastroenterology;  Laterality: N/A;   LEFT HEART CATHETERIZATION WITH CORONARY ANGIOGRAM N/A 05/25/2013   Procedure: LEFT HEART CATHETERIZATION WITH CORONARY ANGIOGRAM;  Surgeon: Troy Sine, MD;  Location: [facility] CATH LAB;  Service: Cardiovascular;  Laterality: N/A;   LYMPH NODE BIOPSY Right 11/30/2012   Procedure: RIGHT TONSIL BIOPSY WITH FRESH FROZEN ANALYSIS;  Surgeon: Jodi Marble, MD;  Location: [facility];  Service: ENT;  Laterality: Right;   MEDIASTERNOTOMY N/A 01/12/2013   Procedure: MEDIAN STERNOTOMY;  Surgeon: Gaye Pollack, MD;   Location: [facility];  Service: Thoracic;  Laterality: N/A;   MEDIASTINOTOMY CHAMBERLAIN MCNEIL    Right 11/06/2012   Procedure: MEDIASTINOTOMY CHAMBERLAIN MCNEIL PROCEDURE;  Surgeon: Gaye Pollack, MD;  Location: [facility];  Service: Thoracic;  Laterality: Right;   POLYPECTOMY  06/21/2017   Procedure: POLYPECTOMY;  Surgeon: Ronnette Juniper, MD;  Location: [facility];  Service: Gastroenterology;;   RESECTION OF A THYMOMA N/A 01/12/2013   Procedure: RESECTION OF A THYMOMA;  Surgeon: Gaye Pollack, MD;  Location: [facility];  Service: Thoracic;  Laterality: N/A;   TONSILLECTOMY     TUBAL LIGATION      There were no vitals filed for this visit.   Subjective Assessment - 07/11/20 1130     Subjective A little nervous for the pool but feeling okay today. was able to ambulate independently back to the aquatic center.    Currently in Pain? No/denies                 AquaticREHABdocumentation: Pt is unable to tolerate land due to pain or inability to move freely on land without pain and substitution/compensations , Water will allow for reduced gait deviation due to reduced joint loading through buoyancy to help patient improve posture without excess stress and pain. , Water will provide increased arousal using the property of surface tension as this patient struggles  with lethargy which impairs the cognitive processing., and Water will allow for reduced gait deviation due to reduced joint loading through buoyancy to help patient improve posture without excess stress and pain  Aquatic exercises:  Walking- incr speed in beginning, leisure at end of session Side stepping Hopping- mini abd/add of lower extremities Kick board pumps under water Alt punches- colorful dumbbells under water                        PT Short Term Goals - 06/25/20 1445       PT SHORT TERM GOAL #1   Title Pt will be independent with initial HEP    Time 3    Period Weeks    Status New    Target Date 07/16/20       PT SHORT TERM GOAL #2   Title Pt will be ambulating 2 minutes at least 3x in the day for home walking program    Baseline Not currently doing any formal walking program    Time 3    Period Weeks    Status New    Target Date 07/16/20      PT SHORT TERM GOAL #3   Title Pt will demo improved double leg press strength with increased 1 RM to 220 lbs    Baseline 195 lbs 1 RM on cybex leg press    Time 3    Period Weeks    Status New    Target Date 07/16/20               PT Long Term Goals - 06/25/20 1447       PT LONG TERM GOAL #1   Title Pt will be independent with continuing to self progress her exercises at home    Time 6    Period Weeks    Status New    Target Date 08/06/20      PT LONG TERM GOAL #2   Title Pt will improve 2 MWT to at least 300' to demo improved walking endurance    Baseline 246' on 06/25/20    Time 6    Period Weeks    Status New    Target Date 08/06/20      PT LONG TERM GOAL #3   Title Pt will demo 5x STS of <13 sec to demo improved functional LE strength    Baseline 16 sec    Time 6    Period Weeks    Status New    Target Date 08/06/20                   Plan - 07/11/20 1100     Clinical Impression Statement Pt did very well in the water even though she was nervous. Entered the pool via stairs using bil hand rails and worked in depth 3'6" with a waist float attached, water temp 88-90 deg. Felt very fatigued after what we did today and discussed DOMS and importance of communicating limitations- keeping exercise at a moderate level. Will continue with all aqautics through the month of july to build up stamina to tolerance land therapy more effectively. Reported feeling heavy and fatigued following exercise today.    PT Treatment/Interventions ADLs/Self Care Home Management;DME Instruction;Gait training;Stair training;Functional mobility training;Therapeutic activities;Balance training;Therapeutic exercise;Neuromuscular  re-education;Patient/family education;Energy conservation    PT Next Visit Plan cont with endurance in pool, modify PRN    PT Home Exercise Plan [id number]    Consulted and  Agree with Plan of Care Patient             Patient will benefit from skilled therapeutic intervention in order to improve the following deficits and impairments:  Abnormal gait, Cardiopulmonary status limiting activity, Decreased activity tolerance, Decreased balance, Decreased coordination, Decreased endurance, Decreased mobility, Difficulty walking, Decreased strength, Impaired sensation, Postural dysfunction, Obesity, Impaired UE functional use, Decreased range of motion, Pain  Visit Diagnosis: Muscle weakness (generalized)  Unsteadiness on feet  Other abnormalities of gait and mobility  Difficulty in walking, not elsewhere classified     Problem List Patient Active Problem List   Diagnosis Date Noted   Long COVID 01/02/2020   Mixed stress and urge urinary incontinence 07/04/2019   Nerve pain 05/16/2019   Myofascial pain 05/16/2019   Subluxation of acromioclavicular joint, right, sequela 03/23/2019   Sickle cell trait ([place])    Labile blood glucose    Diabetic peripheral neuropathy (HCC)    Hypokalemia    Pain in joint of left shoulder    Debility    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   COVID-19    History of anemia    Super obese    Poorly controlled type 2 diabetes mellitus with peripheral neuropathy ([place])    Acute on chronic diastolic (congestive) heart failure (HCC)    Hypernatremia    Shock circulatory ([place])    Septic shock (HCC)    Hypotension 01/13/2019   Acute respiratory failure with hypoxemia ([place]) 01/13/2019   Acute respiratory distress syndrome (ARDS) due to COVID-19 virus ([place]) 01/12/2019   Grade I diastolic dysfunction 62/83/1517   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   Dyspnea on exertion 05/19/2016   Other fatigue 05/19/2016   Absolute anemia 04/15/2016   Left leg weakness  10/18/2014   Left leg paresthesias 10/18/2014   Acute left-sided weakness 10/18/2014   Encounter for central line placement 03/08/2014   Taking drug for chronic disease 03/08/2014   H/O insertion of insulin pump 02/18/2014   Long term current use of insulin ([place]) 02/18/2014   Insulin pump in place 02/18/2014   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   Abnormal ballistocardiogram 05/18/2013   Abnormal nuclear stress test 05/18/2013   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   NICM (nonischemic cardiomyopathy) ([place]) 05/01/2013   Insulin dependent type 2 diabetes mellitus ([place]) 04/10/2013   Sex counseling 04/10/2013   Edema leg 04/10/2013   Upper respiratory infection, viral 04/10/2013   Pedal edema 03/29/2013   Pneumonia 12/12/2012   Essential hypertension 12/12/2012   HLD (hyperlipidemia) 12/12/2012   Tonsillar mass 12/01/2012   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 Brick Ketcher PT, DPT 07/11/20 11:36 AM   [facility] [place], [place], [address] Phone: [phone]   Fax:  [phone]  Name: Kaitlin [name] MRN: [id number] Date of Birth: 11-Jun-1965

## 2020-07-16 ENCOUNTER — Other Ambulatory Visit: Payer: Self-pay

## 2020-07-16 ENCOUNTER — Ambulatory Visit (HOSPITAL_BASED_OUTPATIENT_CLINIC_OR_DEPARTMENT_OTHER): Payer: Medicare Other | Admitting: Physical Therapy

## 2020-07-16 ENCOUNTER — Encounter (HOSPITAL_BASED_OUTPATIENT_CLINIC_OR_DEPARTMENT_OTHER): Payer: Self-pay | Admitting: Physical Therapy

## 2020-07-16 DIAGNOSIS — R2681 Unsteadiness on feet: Secondary | ICD-10-CM

## 2020-07-16 DIAGNOSIS — M6281 Muscle weakness (generalized): Secondary | ICD-10-CM

## 2020-07-16 DIAGNOSIS — R2689 Other abnormalities of gait and mobility: Secondary | ICD-10-CM

## 2020-07-16 DIAGNOSIS — R262 Difficulty in walking, not elsewhere classified: Secondary | ICD-10-CM

## 2020-07-16 NOTE — Patient Instructions (Signed)
Continue with land HEP.

## 2020-07-16 NOTE — Therapy (Signed)
[facility] [address] [place], [place], [address] Phone: [phone]   Fax:  [phone]  Physical Therapy Treatment  Patient Details  Name: Kaitlin [name] MRN: [id number] Date of Birth: [birth year] Referring Provider (PT): Courtney Heys, MD   Encounter Date: 07/16/2020   PT End of Session - 07/16/20 1051     Visit Number 4    Number of Visits 13    Date for PT Re-Evaluation 08/06/20    Authorization Type UHC Medicare             Past Medical History:  Diagnosis Date   Anemia    Bronchitis    Cataracts, bilateral 01/12/2018   CHF (congestive heart failure) ([place])    Diabetes mellitus    High cholesterol    Hypertension    takes medicine to protect kidneys, does not have HTN   Leukocytosis    Morbid obesity ([place]) 09/11/2011   BMI 57   Neuropathy 01/12/2018   NICM (nonischemic cardiomyopathy) ([place]) 05/01/2013   Overview:  Last Assessment & Plan:  euvolemic on physical exam.    Obesity    Sickle cell trait ([place])    Thymoma     Past Surgical History:  Procedure Laterality Date   COLONOSCOPY N/A 06/21/2017   Procedure: COLONOSCOPY;  Surgeon: Ronnette Juniper, MD;  Location: [facility];  Service: Gastroenterology;  Laterality: N/A;   LEFT HEART CATHETERIZATION WITH CORONARY ANGIOGRAM N/A 05/25/2013   Procedure: LEFT HEART CATHETERIZATION WITH CORONARY ANGIOGRAM;  Surgeon: Troy Sine, MD;  [facility];  Service: Cardiovascular;  Laterality: N/A;   LYMPH NODE BIOPSY Right 11/30/2012   Procedure: RIGHT TONSIL BIOPSY WITH FRESH FROZEN ANALYSIS;  Surgeon: Jodi Marble, MD;  Location: [place];  Service: ENT;  Laterality: Right;   MEDIASTERNOTOMY N/A 01/12/2013   Procedure: MEDIAN STERNOTOMY;  Surgeon: Gaye Pollack, MD;  Location: [facility];  Service: Thoracic;  Laterality: N/A;   MEDIASTINOTOMY CHAMBERLAIN MCNEIL    Right 11/06/2012   Procedure: MEDIASTINOTOMY CHAMBERLAIN MCNEIL PROCEDURE;  Surgeon: Gaye Pollack,  MD;  Location: [facility];  Service: Thoracic;  Laterality: Right;   POLYPECTOMY  06/21/2017   Procedure: POLYPECTOMY;  Surgeon: Ronnette Juniper, MD;  Location: [facility];  Service: Gastroenterology;;   RESECTION OF A THYMOMA N/A 01/12/2013   Procedure: RESECTION OF A THYMOMA;  Surgeon: Gaye Pollack, MD;  [facility];  Service: Thoracic;  Laterality: N/A;   TONSILLECTOMY     TUBAL LIGATION      There were no vitals filed for this visit.   Subjective Assessment - 07/16/20 1030     Subjective Pt reports some soarness afte last aquatic session and was tired but overall a good experience able to exercise  without right knee pain. A little less nervous.    Pain Score 4     Pain Location Knee    Pain Orientation Right    Pain Descriptors / Indicators Aching    Pain Type Chronic pain    Pain Onset More than a month ago    Pain Frequency Intermittent           Pt seen for aquatic therapy today.  Treatment took place in water 3.25-4.8 ft in depth at the [facility] pool. Temp of water was 91.  Pt entered/exited the pool via stairs step to pattern CGA with bilat rail.   Warm up: forward, backward and side stepping/walking cues for increased step length, increased speed, hand placement to  increase resistance. Pt with floatation belt holding to noodle then HHA.  Seated Strengthening of hamstring, gastroc, and hip adductors Knee flex and extension resisted by water x 10 reps RLE resisted by ankle buoys x 10 reps with cuing for increased knee flex speed. Aerobic capacity training kicking 2 x 20 reps.  Standing Add/abd x 10 reps, hip flex stretch 3 x 20 seconds f/b knee pull through for hip flex str x 10 reps Jumping /ankle df/pf 80% submerged 2 x 20 reps  Warm down  Sitting flutter kicking x 5-10 mins with rest periods.    Pt requires buoyancy for support and to offload joints with strengthening exercises. Viscosity of the water is needed for resistance of strengthening; water  current perturbations provides challenge to standing balance unsupported, requiring increased core activation.                               PT Short Term Goals - 06/25/20 1445       PT SHORT TERM GOAL #1   Title Pt will be independent with initial HEP    Time 3    Period Weeks    Status New    Target Date 07/16/20      PT SHORT TERM GOAL #2   Title Pt will be ambulating 2 minutes at least 3x in the day for home walking program    Baseline Not currently doing any formal walking program    Time 3    Period Weeks    Status New    Target Date 07/16/20      PT SHORT TERM GOAL #3   Title Pt will demo improved double leg press strength with increased 1 RM to 220 lbs    Baseline 195 lbs 1 RM on cybex leg press    Time 3    Period Weeks    Status New    Target Date 07/16/20               PT Long Term Goals - 06/25/20 1447       PT LONG TERM GOAL #1   Title Pt will be independent with continuing to self progress her exercises at home    Time 6    Period Weeks    Status New    Target Date 08/06/20      PT LONG TERM GOAL #2   Title Pt will improve 2 MWT to at least 300' to demo improved walking endurance    Baseline 246' on 06/25/20    Time 6    Period Weeks    Status New    Target Date 08/06/20      PT LONG TERM GOAL #3   Title Pt will demo 5x STS of <13 sec to demo improved functional LE strength    Baseline 16 sec    Time 6    Period Weeks    Status New    Target Date 08/06/20            Clinical Statement Pt with slightly less apprehension in setting.  Required multiple rest periods throughout due to exertional SOB.  She engages in a variety of exercises and stretches in sitting and standing.  She is unable to tolerate prone suspension and politely declined supine suspension.  No pain in right knee throughout treamtent, minimal LB.       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, CHF, diabetes mellitus, HTN, neuropathy, obesity    Examination-Activity Limitations Stand;Sit;Transfers;Stairs;Squat;Locomotion Level    Clinical Decision Making Moderate    Rehab Potential Good    PT Frequency 2x / week    PT Duration 4 weeks    PT Treatment/Interventions ADLs/Self Care Home Management;DME Instruction;Gait training;Stair training;Functional mobility training;Therapeutic activities;Balance training;Therapeutic exercise;Neuromuscular re-education;Patient/family education;Energy conservation    PT Next Visit Plan Add UE challenges    PT Home Exercise Plan [email or web address]             Patient will benefit from skilled therapeutic intervention in order to improve the following deficits and impairments:  Abnormal gait, Cardiopulmonary status limiting activity, Decreased activity tolerance, Decreased balance, Decreased coordination, Decreased endurance, Decreased mobility, Difficulty walking, Decreased strength, Impaired sensation, Postural dysfunction, Obesity, Impaired UE functional use, Decreased range of motion, Pain  Visit Diagnosis: Muscle weakness (generalized)  Unsteadiness on feet  Other abnormalities of gait and mobility  Difficulty in walking, not elsewhere classified     Problem List Patient Active Problem List   Diagnosis Date Noted   Long COVID 01/02/2020   Mixed stress and urge urinary incontinence 07/04/2019   Nerve pain 05/16/2019   Myofascial pain 05/16/2019   Subluxation of acromioclavicular joint, right, sequela 03/23/2019   Sickle cell trait ([place])    Labile blood glucose    Diabetic peripheral neuropathy (HCC)    Hypokalemia    Pain in joint of left shoulder    Debility    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   COVID-19    History of anemia    Super obese    Poorly controlled type 2 diabetes mellitus with peripheral  neuropathy ([place])    Acute on chronic diastolic (congestive) heart failure (HCC)    Hypernatremia    Shock circulatory (HCC)    Septic shock (HCC)    Hypotension 01/13/2019   Acute respiratory failure with hypoxemia ([place]) 01/13/2019   Acute respiratory distress syndrome (ARDS) due to COVID-19 virus ([place]) 01/12/2019   Grade I diastolic dysfunction 38/25/0539   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   Dyspnea on exertion 05/19/2016   Other fatigue 05/19/2016   Absolute anemia 04/15/2016   Left leg weakness 10/18/2014   Left leg paresthesias 10/18/2014   Acute left-sided weakness 10/18/2014   Encounter for central line placement 03/08/2014   Taking drug for chronic disease 03/08/2014   H/O insertion of insulin pump 02/18/2014   Long term current use of insulin ([place]) 02/18/2014   Insulin pump in place 02/18/2014   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   Abnormal ballistocardiogram 05/18/2013   Abnormal nuclear stress test 05/18/2013   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   NICM (nonischemic cardiomyopathy) ([place]) 05/01/2013   Insulin dependent type 2 diabetes mellitus ([place]) 04/10/2013   Sex counseling 04/10/2013   Edema leg 04/10/2013   Upper respiratory infection, viral 04/10/2013   Pedal edema 03/29/2013   Pneumonia 12/12/2012   Essential hypertension 12/12/2012   HLD (hyperlipidemia) 12/12/2012   Tonsillar mass 12/01/2012   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

## 2020-07-18 ENCOUNTER — Ambulatory Visit (HOSPITAL_BASED_OUTPATIENT_CLINIC_OR_DEPARTMENT_OTHER): Payer: Medicare Other | Admitting: Physical Therapy

## 2020-07-18 ENCOUNTER — Other Ambulatory Visit: Payer: Self-pay

## 2020-07-18 ENCOUNTER — Encounter (HOSPITAL_BASED_OUTPATIENT_CLINIC_OR_DEPARTMENT_OTHER): Payer: Self-pay | Admitting: Physical Therapy

## 2020-07-18 DIAGNOSIS — R2689 Other abnormalities of gait and mobility: Secondary | ICD-10-CM

## 2020-07-18 DIAGNOSIS — R262 Difficulty in walking, not elsewhere classified: Secondary | ICD-10-CM

## 2020-07-18 DIAGNOSIS — M6281 Muscle weakness (generalized): Secondary | ICD-10-CM

## 2020-07-18 DIAGNOSIS — R2681 Unsteadiness on feet: Secondary | ICD-10-CM

## 2020-07-18 NOTE — Therapy (Signed)
[facility] [address] [place], [place], [address] Phone: [phone]   Fax:  [phone]  Physical Therapy Treatment  Patient Details  Name: Kaitlin [name] MRN: [id number] Date of Birth: [birth year] Referring Provider (PT): Courtney Heys, MD   Encounter Date: 07/18/2020   PT End of Session - 07/18/20 1439     Visit Number 5    Number of Visits 13    Date for PT Re-Evaluation 08/06/20    Authorization Type UHC Medicare    PT Start Time 8182    PT Stop Time 1514    PT Time Calculation (min) 41 min    Activity Tolerance Patient tolerated treatment well    Behavior During Therapy [facility] for tasks assessed/performed             Past Medical History:  Diagnosis Date   Anemia    Bronchitis    Cataracts, bilateral 01/12/2018   CHF (congestive heart failure) ([place])    Diabetes mellitus    High cholesterol    Hypertension    takes medicine to protect kidneys, does not have HTN   Leukocytosis    Morbid obesity ([place]) 09/11/2011   BMI 57   Neuropathy 01/12/2018   NICM (nonischemic cardiomyopathy) ([place]) 05/01/2013   Overview:  Last Assessment & Plan:  euvolemic on physical exam.    Obesity    Sickle cell trait ([place])    Thymoma     Past Surgical History:  Procedure Laterality Date   COLONOSCOPY N/A 06/21/2017   Procedure: COLONOSCOPY;  Surgeon: Ronnette Juniper, MD;  [facility];  Service: Gastroenterology;  Laterality: N/A;   LEFT HEART CATHETERIZATION WITH CORONARY ANGIOGRAM N/A 05/25/2013   Procedure: LEFT HEART CATHETERIZATION WITH CORONARY ANGIOGRAM;  Surgeon: Troy Sine, MD;  Location: [facility] CATH LAB;  Service: Cardiovascular;  Laterality: N/A;   LYMPH NODE BIOPSY Right 11/30/2012   Procedure: RIGHT TONSIL BIOPSY WITH FRESH FROZEN ANALYSIS;  Surgeon: Jodi Marble, MD;  Location: [facility];  Service: ENT;  Laterality: Right;   MEDIASTERNOTOMY N/A 01/12/2013   Procedure: MEDIAN STERNOTOMY;  Surgeon: Gaye Pollack, MD;   Location: [facility];  Service: Thoracic;  Laterality: N/A;   MEDIASTINOTOMY CHAMBERLAIN MCNEIL    Right 11/06/2012   Procedure: MEDIASTINOTOMY CHAMBERLAIN MCNEIL PROCEDURE;  Surgeon: Gaye Pollack, MD;  Location: [facility];  Service: Thoracic;  Laterality: Right;   POLYPECTOMY  06/21/2017   Procedure: POLYPECTOMY;  Surgeon: Ronnette Juniper, MD;  Location: [facility];  Service: Gastroenterology;;   RESECTION OF A THYMOMA N/A 01/12/2013   Procedure: RESECTION OF A THYMOMA;  Surgeon: Gaye Pollack, MD;  [facility];  Service: Thoracic;  Laterality: N/A;   TONSILLECTOMY     TUBAL LIGATION      There were no vitals filed for this visit.   Subjective Assessment - 07/18/20 1438     Subjective Some pain but not bad.                    aquatic exercises: Walking across- yellow water dumbbells, static & pressing with trunk rot; pressing kick board Side stepping with kick board Float wrist wraps- quick ext, slow flexion; quick add, slow add Ai Chi Enclosing & soothing (4+6) Seated bicycles & LE abd/add Step ups- fwd & lateral, 10x each Leg circles- single leg on first step Walking for cool down  PT Short Term Goals - 06/25/20 1445       PT SHORT TERM GOAL #1   Title Pt will be independent with initial HEP    Time 3    Period Weeks    Status New    Target Date 07/16/20      PT SHORT TERM GOAL #2   Title Pt will be ambulating 2 minutes at least 3x in the day for home walking program    Baseline Not currently doing any formal walking program    Time 3    Period Weeks    Status New    Target Date 07/16/20      PT SHORT TERM GOAL #3   Title Pt will demo improved double leg press strength with increased 1 RM to 220 lbs    Baseline 195 lbs 1 RM on cybex leg press    Time 3    Period Weeks    Status New    Target Date 07/16/20               PT Long Term Goals - 06/25/20 1447       PT LONG TERM GOAL #1   Title Pt will be  independent with continuing to self progress her exercises at home    Time 6    Period Weeks    Status New    Target Date 08/06/20      PT LONG TERM GOAL #2   Title Pt will improve 2 MWT to at least 300' to demo improved walking endurance    Baseline 246' on 06/25/20    Time 6    Period Weeks    Status New    Target Date 08/06/20      PT LONG TERM GOAL #3   Title Pt will demo 5x STS of <13 sec to demo improved functional LE strength    Baseline 16 sec    Time 6    Period Weeks    Status New    Target Date 08/06/20                   Plan - 07/18/20 1514     Clinical Impression Statement pt entered pool via stairs with use of bil hand rails, depth up to 3'6", temp 88-90 deg. ended session today with walking and pt wanted to try without float attached. reported fatigue in exercises as expected, mild discomfort in low back following step ups indicating further need for core stability training.    PT Treatment/Interventions ADLs/Self Care Home Management;DME Instruction;Gait training;Stair training;Functional mobility training;Therapeutic activities;Balance training;Therapeutic exercise;Neuromuscular re-education;Patient/family education;Energy conservation    PT Next Visit Plan cont UE challenges, core stability    PT Home Exercise Plan [email or web address]    Consulted and Agree with Plan of Care Patient             Patient will benefit from skilled therapeutic intervention in order to improve the following deficits and impairments:  Abnormal gait, Cardiopulmonary status limiting activity, Decreased activity tolerance, Decreased balance, Decreased coordination, Decreased endurance, Decreased mobility, Difficulty walking, Decreased strength, Impaired sensation, Postural dysfunction, Obesity, Impaired UE functional use, Decreased range of motion, Pain  Visit Diagnosis: Muscle weakness (generalized)  Unsteadiness on feet  Other abnormalities of gait and mobility  Difficulty in  walking, not elsewhere classified     Problem List Patient Active Problem List   Diagnosis Date Noted   Long COVID 01/02/2020   Mixed stress and urge urinary incontinence 07/04/2019  Nerve pain 05/16/2019   Myofascial pain 05/16/2019   Subluxation of acromioclavicular joint, right, sequela 03/23/2019   Sickle cell trait ([place])    Labile blood glucose    Diabetic peripheral neuropathy (HCC)    Hypokalemia    Pain in joint of left shoulder    Debility    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   COVID-19    History of anemia    Super obese    Poorly controlled type 2 diabetes mellitus with peripheral neuropathy ([place])    Acute on chronic diastolic (congestive) heart failure (HCC)    Hypernatremia    Shock circulatory ([place])    Septic shock (HCC)    Hypotension 01/13/2019   Acute respiratory failure with hypoxemia ([place]) 01/13/2019   Acute respiratory distress syndrome (ARDS) due to COVID-19 virus ([place]) 01/12/2019   Grade I diastolic dysfunction 38/10/1749   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   Dyspnea on exertion 05/19/2016   Other fatigue 05/19/2016   Absolute anemia 04/15/2016   Left leg weakness 10/18/2014   Left leg paresthesias 10/18/2014   Acute left-sided weakness 10/18/2014   Encounter for central line placement 03/08/2014   Taking drug for chronic disease 03/08/2014   H/O insertion of insulin pump 02/18/2014   Long term current use of insulin ([place]) 02/18/2014   Insulin pump in place 02/18/2014   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   Abnormal ballistocardiogram 05/18/2013   Abnormal nuclear stress test 05/18/2013   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   NICM (nonischemic cardiomyopathy) ([place]) 05/01/2013   Insulin dependent type 2 diabetes mellitus ([place]) 04/10/2013   Sex counseling 04/10/2013   Edema leg 04/10/2013   Upper respiratory infection, viral 04/10/2013   Pedal edema 03/29/2013   Pneumonia 12/12/2012   Essential hypertension 12/12/2012   HLD  (hyperlipidemia) 12/12/2012   Tonsillar mass 12/01/2012   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 [address] [place], [place], [address] Phone: [phone]   Fax:  [phone]  Name: Kaitlin [name] MRN: [id number] Date of Birth: 01/13/1965

## 2020-07-21 ENCOUNTER — Other Ambulatory Visit: Payer: Self-pay | Admitting: Physician Assistant

## 2020-07-21 ENCOUNTER — Ambulatory Visit (HOSPITAL_BASED_OUTPATIENT_CLINIC_OR_DEPARTMENT_OTHER): Payer: Medicare Other | Admitting: Physical Therapy

## 2020-07-21 DIAGNOSIS — N63 Unspecified lump in unspecified breast: Secondary | ICD-10-CM

## 2020-07-22 ENCOUNTER — Ambulatory Visit (HOSPITAL_BASED_OUTPATIENT_CLINIC_OR_DEPARTMENT_OTHER): Payer: Medicare Other | Admitting: Physical Therapy

## 2020-07-22 ENCOUNTER — Other Ambulatory Visit: Payer: Self-pay | Admitting: Physician Assistant

## 2020-07-22 DIAGNOSIS — N63 Unspecified lump in unspecified breast: Secondary | ICD-10-CM

## 2020-07-23 ENCOUNTER — Encounter (HOSPITAL_BASED_OUTPATIENT_CLINIC_OR_DEPARTMENT_OTHER): Payer: Self-pay | Admitting: Physical Therapy

## 2020-07-23 ENCOUNTER — Ambulatory Visit (HOSPITAL_BASED_OUTPATIENT_CLINIC_OR_DEPARTMENT_OTHER): Payer: Medicare Other | Admitting: Physical Therapy

## 2020-07-23 ENCOUNTER — Other Ambulatory Visit: Payer: Self-pay

## 2020-07-23 DIAGNOSIS — M6281 Muscle weakness (generalized): Secondary | ICD-10-CM

## 2020-07-23 DIAGNOSIS — R262 Difficulty in walking, not elsewhere classified: Secondary | ICD-10-CM

## 2020-07-23 DIAGNOSIS — R2681 Unsteadiness on feet: Secondary | ICD-10-CM

## 2020-07-23 DIAGNOSIS — R2689 Other abnormalities of gait and mobility: Secondary | ICD-10-CM

## 2020-07-23 NOTE — Therapy (Signed)
[facility] [address] [place], [place], [address] Phone: [phone]   Fax:  [phone]  Physical Therapy Treatment  Patient Details  Name: Kaitlin [name] MRN: [id number] Date of Birth: Aug 29, 1965 Referring Provider (PT): Courtney Heys, MD   Encounter Date: 07/23/2020   PT End of Session - 07/23/20 1242     Visit Number 6    Number of Visits 13    Date for PT Re-Evaluation 08/06/20    Authorization Type UHC Medicare    PT Start Time 6659    PT Stop Time 9357    PT Time Calculation (min) 45 min    Activity Tolerance Patient tolerated treatment well    Behavior During Therapy [facility] for tasks assessed/performed             Past Medical History:  Diagnosis Date   Anemia    Bronchitis    Cataracts, bilateral 01/12/2018   CHF (congestive heart failure) ([place])    Diabetes mellitus    High cholesterol    Hypertension    takes medicine to protect kidneys, does not have HTN   Leukocytosis    Morbid obesity ([place]) 09/11/2011   BMI 57   Neuropathy 01/12/2018   NICM (nonischemic cardiomyopathy) ([place]) 05/01/2013   Overview:  Last Assessment & Plan:  euvolemic on physical exam.    Obesity    Sickle cell trait ([place])    Thymoma     Past Surgical History:  Procedure Laterality Date   COLONOSCOPY N/A 06/21/2017   Procedure: COLONOSCOPY;  Surgeon: Ronnette Juniper, MD;  Location: [facility];  Service: Gastroenterology;  Laterality: N/A;   LEFT HEART CATHETERIZATION WITH CORONARY ANGIOGRAM N/A 05/25/2013   Procedure: LEFT HEART CATHETERIZATION WITH CORONARY ANGIOGRAM;  Surgeon: Troy Sine, MD;  Location: [facility] CATH LAB;  Service: Cardiovascular;  Laterality: N/A;   LYMPH NODE BIOPSY Right 11/30/2012   Procedure: RIGHT TONSIL BIOPSY WITH FRESH FROZEN ANALYSIS;  Surgeon: Jodi Marble, MD;  Location: [facility];  Service: ENT;  Laterality: Right;   MEDIASTERNOTOMY N/A 01/12/2013   Procedure: MEDIAN STERNOTOMY;  Surgeon: Gaye Pollack,  MD;  Location: [facility];  Service: Thoracic;  Laterality: N/A;   MEDIASTINOTOMY CHAMBERLAIN MCNEIL    Right 11/06/2012   Procedure: MEDIASTINOTOMY CHAMBERLAIN MCNEIL PROCEDURE;  Surgeon: Gaye Pollack, MD;  Location: [facility];  Service: Thoracic;  Laterality: Right;   POLYPECTOMY  06/21/2017   Procedure: POLYPECTOMY;  Surgeon: Ronnette Juniper, MD;  Location: [facility];  Service: Gastroenterology;;   RESECTION OF A THYMOMA N/A 01/12/2013   Procedure: RESECTION OF A THYMOMA;  Surgeon: Gaye Pollack, MD;  Location: [facility];  Service: Thoracic;  Laterality: N/A;   TONSILLECTOMY     TUBAL LIGATION      There were no vitals filed for this visit.   Subjective Assessment - 07/23/20 1152     Subjective Sore but denies concordant pain. I can maybe stand a few seconds longer.    Patient Stated Goals wants to be able to walk normal and not feel like she is going to topple over. standing up is difficult                       Aquatic exercises: Backward walking Lateral stepping with UE horiz abd/add Kick board fanning under watter, rows Leg circles in SLS using noodle for UE support Heel raises Gastroc stretch Hip abduction in turnout- trunk flexion to reduce use of low back Reciprocal gait-  pushing floating dummbbells                      PT Short Term Goals - 07/23/20 1248       PT SHORT TERM GOAL #1   Title Pt will be independent with initial HEP    Status Achieved      PT SHORT TERM GOAL #2   Title Pt will be ambulating 2 minutes at least 3x in the day for home walking program    Status Achieved      PT SHORT TERM GOAL #3   Title Pt will demo improved double leg press strength with increased 1 RM to 220 lbs    Status Unable to assess               PT Long Term Goals - 06/25/20 1447       PT LONG TERM GOAL #1   Title Pt will be independent with continuing to self progress her exercises at home    Time 6    Period Weeks    Status New    Target  Date 08/06/20      PT LONG TERM GOAL #2   Title Pt will improve 2 MWT to at least 300' to demo improved walking endurance    Baseline 246' on 06/25/20    Time 6    Period Weeks    Status New    Target Date 08/06/20      PT LONG TERM GOAL #3   Title Pt will demo 5x STS of <13 sec to demo improved functional LE strength    Baseline 16 sec    Time 6    Period Weeks    Status New    Target Date 08/06/20                   Plan - 07/23/20 1243     Clinical Impression Statement pt entered pool via stairs with use of bil hand rails, depth up to 3'6", temp 88-90 deg. Lengthened lumbar spine in hip abd motions to reduce use of QL which she could feel stretching when doing sit to stands. improving confidence in motion and demo more upright psoture. challenged to perform sit to stand throughout her day without use of UE and focus on use of hip hinge with glut sets.    PT Treatment/Interventions ADLs/Self Care Home Management;DME Instruction;Gait training;Stair training;Functional mobility training;Therapeutic activities;Balance training;Therapeutic exercise;Neuromuscular re-education;Patient/family education;Energy conservation    PT Next Visit Plan how did sit to stand go?    PT Home Exercise Plan [email or web address]    Consulted and Agree with Plan of Care Patient             Patient will benefit from skilled therapeutic intervention in order to improve the following deficits and impairments:  Abnormal gait, Cardiopulmonary status limiting activity, Decreased activity tolerance, Decreased balance, Decreased coordination, Decreased endurance, Decreased mobility, Difficulty walking, Decreased strength, Impaired sensation, Postural dysfunction, Obesity, Impaired UE functional use, Decreased range of motion, Pain  Visit Diagnosis: Muscle weakness (generalized)  Unsteadiness on feet  Other abnormalities of gait and mobility  Difficulty in walking, not elsewhere classified     Problem  List Patient Active Problem List   Diagnosis Date Noted   Long COVID 01/02/2020   Mixed stress and urge urinary incontinence 07/04/2019   Nerve pain 05/16/2019   Myofascial pain 05/16/2019   Subluxation of acromioclavicular joint, right, sequela 03/23/2019   Sickle cell trait (  [place])    Labile blood glucose    Diabetic peripheral neuropathy (HCC)    Hypokalemia    Pain in joint of left shoulder    Debility    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   COVID-19    History of anemia    Super obese    Poorly controlled type 2 diabetes mellitus with peripheral neuropathy ([place])    Acute on chronic diastolic (congestive) heart failure (HCC)    Hypernatremia    Shock circulatory ([place])    Septic shock (HCC)    Hypotension 01/13/2019   Acute respiratory failure with hypoxemia ([place]) 01/13/2019   Acute respiratory distress syndrome (ARDS) due to COVID-19 virus ([place]) 01/12/2019   Grade I diastolic dysfunction 78/24/2353   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   Dyspnea on exertion 05/19/2016   Other fatigue 05/19/2016   Absolute anemia 04/15/2016   Left leg weakness 10/18/2014   Left leg paresthesias 10/18/2014   Acute left-sided weakness 10/18/2014   Encounter for central line placement 03/08/2014   Taking drug for chronic disease 03/08/2014   H/O insertion of insulin pump 02/18/2014   Long term current use of insulin ([place]) 02/18/2014   Insulin pump in place 02/18/2014   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   Abnormal ballistocardiogram 05/18/2013   Abnormal nuclear stress test 05/18/2013   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   NICM (nonischemic cardiomyopathy) ([place]) 05/01/2013   Insulin dependent type 2 diabetes mellitus ([place]) 04/10/2013   Sex counseling 04/10/2013   Edema leg 04/10/2013   Upper respiratory infection, viral 04/10/2013   Pedal edema 03/29/2013   Pneumonia 12/12/2012   Essential hypertension 12/12/2012   HLD (hyperlipidemia) 12/12/2012   Tonsillar mass 12/01/2012    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 Ellarae Nevitt PT, DPT 07/23/20 12:49 PM   [facility] [address] [place], [place], [address] Phone: [phone]   Fax:  [phone]  Name: Kaitlin [name] MRN: [id number] Date of Birth: 1965-09-27

## 2020-07-24 ENCOUNTER — Ambulatory Visit (HOSPITAL_BASED_OUTPATIENT_CLINIC_OR_DEPARTMENT_OTHER): Payer: Medicare Other | Admitting: Physical Therapy

## 2020-07-24 DIAGNOSIS — M6281 Muscle weakness (generalized): Secondary | ICD-10-CM | POA: Diagnosis not present

## 2020-07-24 DIAGNOSIS — R2689 Other abnormalities of gait and mobility: Secondary | ICD-10-CM

## 2020-07-24 DIAGNOSIS — R2681 Unsteadiness on feet: Secondary | ICD-10-CM

## 2020-07-24 NOTE — Therapy (Signed)
[facility] [address] [place], [place], [address] Phone: [phone]   Fax:  [phone]  Physical Therapy Treatment  Patient Details  Name: Kaitlin [name] MRN: [id number] Date of Birth: 1965-06-04 Referring Provider (PT): Courtney Heys, MD   Encounter Date: 07/24/2020   PT End of Session - 07/25/20 2109     Visit Number 7    Number of Visits 13    Date for PT Re-Evaluation 08/06/20    Authorization Type UHC Medicare    PT Start Time 1101    PT Stop Time 9767    PT Time Calculation (min) 44 min    Activity Tolerance Patient tolerated treatment well    Behavior During Therapy [facility] for tasks assessed/performed             Past Medical History:  Diagnosis Date   Anemia    Bronchitis    Cataracts, bilateral 01/12/2018   CHF (congestive heart failure) ([place])    Diabetes mellitus    High cholesterol    Hypertension    takes medicine to protect kidneys, does not have HTN   Leukocytosis    Morbid obesity ([place]) 09/11/2011   BMI 57   Neuropathy 01/12/2018   NICM (nonischemic cardiomyopathy) ([place]) 05/01/2013   Overview:  Last Assessment & Plan:  euvolemic on physical exam.    Obesity    Sickle cell trait ([place])    Thymoma     Past Surgical History:  Procedure Laterality Date   COLONOSCOPY N/A 06/21/2017   Procedure: COLONOSCOPY;  Surgeon: Ronnette Juniper, MD;  [facility];  Service: Gastroenterology;  Laterality: N/A;   LEFT HEART CATHETERIZATION WITH CORONARY ANGIOGRAM N/A 05/25/2013   Procedure: LEFT HEART CATHETERIZATION WITH CORONARY ANGIOGRAM;  Surgeon: Troy Sine, MD;  Location: [facility] CATH LAB;  Service: Cardiovascular;  Laterality: N/A;   LYMPH NODE BIOPSY Right 11/30/2012   Procedure: RIGHT TONSIL BIOPSY WITH FRESH FROZEN ANALYSIS;  Surgeon: Jodi Marble, MD;  Location: [facility];  Service: ENT;  Laterality: Right;   MEDIASTERNOTOMY N/A 01/12/2013   Procedure: MEDIAN STERNOTOMY;  Surgeon: Gaye Pollack,  MD;  Location: [facility];  Service: Thoracic;  Laterality: N/A;   MEDIASTINOTOMY CHAMBERLAIN MCNEIL    Right 11/06/2012   Procedure: MEDIASTINOTOMY CHAMBERLAIN MCNEIL PROCEDURE;  Surgeon: Gaye Pollack, MD;  Location: [facility];  Service: Thoracic;  Laterality: Right;   POLYPECTOMY  06/21/2017   Procedure: POLYPECTOMY;  Surgeon: Ronnette Juniper, MD;  Location: [facility];  Service: Gastroenterology;;   RESECTION OF A THYMOMA N/A 01/12/2013   Procedure: RESECTION OF A THYMOMA;  Surgeon: Gaye Pollack, MD;  Location: [facility];  Service: Thoracic;  Laterality: N/A;   TONSILLECTOMY     TUBAL LIGATION      There were no vitals filed for this visit.   Subjective Assessment - 07/25/20 2108     Subjective Doing okay.    Currently in Pain? No/denies                   aquatic exercises: Forward reciprocal gait yellow weights under water Side stepping + horiz abd/add Gastroc stretch Ai chi- postures 3 & 6 Holding edge of pool- quick hip abd/add forward step downs on water step heel raises edge of step jogging with floats Squat to reach back to wall- noodle reaches forward                   PT Short Term Goals - 07/23/20 1248  PT SHORT TERM GOAL #1   Title Pt will be independent with initial HEP    Status Achieved      PT SHORT TERM GOAL #2   Title Pt will be ambulating 2 minutes at least 3x in the day for home walking program    Status Achieved      PT SHORT TERM GOAL #3   Title Pt will demo improved double leg press strength with increased 1 RM to 220 lbs    Status Unable to assess               PT Long Term Goals - 06/25/20 1447       PT LONG TERM GOAL #1   Title Pt will be independent with continuing to self progress her exercises at home    Time 6    Period Weeks    Status New    Target Date 08/06/20      PT LONG TERM GOAL #2   Title Pt will improve 2 MWT to at least 300' to demo improved walking endurance    Baseline 246' on 06/25/20    Time  6    Period Weeks    Status New    Target Date 08/06/20      PT LONG TERM GOAL #3   Title Pt will demo 5x STS of <13 sec to demo improved functional LE strength    Baseline 16 sec    Time 6    Period Weeks    Status New    Target Date 08/06/20                   Plan - 07/25/20 2109     Clinical Impression Statement pt entered pool via stairs with use of bil hand rails, depts 3'6"-4'8", temp 88-90 deg. Trialed steps at entry way but reported too much knee pain. Able to challenge stepping motions on step in deep water. Added exercise to challenge HR which pt had some difficulty with due to fear of water but well tolerated overall.    PT Treatment/Interventions ADLs/Self Care Home Management;DME Instruction;Gait training;Stair training;Functional mobility training;Therapeutic activities;Balance training;Therapeutic exercise;Neuromuscular re-education;Patient/family education;Energy conservation    PT Next Visit Plan cont sit to stand, stairs, cardio    PT Home Exercise Plan [email or web address]    Consulted and Agree with Plan of Care Patient             Patient will benefit from skilled therapeutic intervention in order to improve the following deficits and impairments:  Abnormal gait, Cardiopulmonary status limiting activity, Decreased activity tolerance, Decreased balance, Decreased coordination, Decreased endurance, Decreased mobility, Difficulty walking, Decreased strength, Impaired sensation, Postural dysfunction, Obesity, Impaired UE functional use, Decreased range of motion, Pain  Visit Diagnosis: Muscle weakness (generalized)  Unsteadiness on feet  Other abnormalities of gait and mobility     Problem List Patient Active Problem List   Diagnosis Date Noted   Long COVID 01/02/2020   Mixed stress and urge urinary incontinence 07/04/2019   Nerve pain 05/16/2019   Myofascial pain 05/16/2019   Subluxation of acromioclavicular joint, right, sequela 03/23/2019   Sickle  cell trait ([place])    Labile blood glucose    Diabetic peripheral neuropathy (HCC)    Hypokalemia    Pain in joint of left shoulder    Debility    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   COVID-19    History of anemia    Super obese    Poorly controlled type  2 diabetes mellitus with peripheral neuropathy (HCC)    Acute on chronic diastolic (congestive) heart failure (HCC)    Hypernatremia    Shock circulatory (HCC)    Septic shock (HCC)    Hypotension 01/13/2019   Acute respiratory failure with hypoxemia ([place]) 01/13/2019   Acute respiratory distress syndrome (ARDS) due to COVID-19 virus ([place]) 01/12/2019   Grade I diastolic dysfunction 37/94/3276   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   Dyspnea on exertion 05/19/2016   Other fatigue 05/19/2016   Absolute anemia 04/15/2016   Left leg weakness 10/18/2014   Left leg paresthesias 10/18/2014   Acute left-sided weakness 10/18/2014   Encounter for central line placement 03/08/2014   Taking drug for chronic disease 03/08/2014   H/O insertion of insulin pump 02/18/2014   Long term current use of insulin ([place]) 02/18/2014   Insulin pump in place 02/18/2014   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   Abnormal ballistocardiogram 05/18/2013   Abnormal nuclear stress test 05/18/2013   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   NICM (nonischemic cardiomyopathy) ([place]) 05/01/2013   Insulin dependent type 2 diabetes mellitus ([place]) 04/10/2013   Sex counseling 04/10/2013   Edema leg 04/10/2013   Upper respiratory infection, viral 04/10/2013   Pedal edema 03/29/2013   Pneumonia 12/12/2012   Essential hypertension 12/12/2012   HLD (hyperlipidemia) 12/12/2012   Tonsillar mass 12/01/2012   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 Vikas Wegmann PT, DPT 07/25/20 9:12 PM   [facility] [address] [place], [place], [address] Phone: [phone]   Fax:  [phone]  Name: OLETA GUNNOE MRN: [id number] Date of Birth: [birth year]

## 2020-07-25 ENCOUNTER — Encounter (HOSPITAL_BASED_OUTPATIENT_CLINIC_OR_DEPARTMENT_OTHER): Payer: Self-pay | Admitting: Physical Therapy

## 2020-07-26 ENCOUNTER — Other Ambulatory Visit: Payer: Self-pay | Admitting: Physician Assistant

## 2020-07-26 ENCOUNTER — Ambulatory Visit
Admission: RE | Admit: 2020-07-26 | Discharge: 2020-07-26 | Disposition: A | Discharge status: Home / Self Care | Payer: Medicare Other | Source: Ambulatory Visit | Attending: Physician Assistant | Admitting: Physician Assistant

## 2020-07-26 DIAGNOSIS — N63 Unspecified lump in unspecified breast: Secondary | ICD-10-CM

## 2020-07-30 ENCOUNTER — Other Ambulatory Visit: Payer: Self-pay

## 2020-07-30 ENCOUNTER — Encounter (HOSPITAL_BASED_OUTPATIENT_CLINIC_OR_DEPARTMENT_OTHER): Payer: Self-pay | Admitting: Physical Therapy

## 2020-07-30 ENCOUNTER — Ambulatory Visit (HOSPITAL_BASED_OUTPATIENT_CLINIC_OR_DEPARTMENT_OTHER): Payer: Medicare Other | Admitting: Physical Therapy

## 2020-07-30 DIAGNOSIS — M6281 Muscle weakness (generalized): Secondary | ICD-10-CM

## 2020-07-30 DIAGNOSIS — R262 Difficulty in walking, not elsewhere classified: Secondary | ICD-10-CM

## 2020-07-30 DIAGNOSIS — R2681 Unsteadiness on feet: Secondary | ICD-10-CM

## 2020-07-30 DIAGNOSIS — R2689 Other abnormalities of gait and mobility: Secondary | ICD-10-CM

## 2020-07-30 NOTE — Therapy (Signed)
[place], [place], [address] Phone: [phone]   Fax:  [phone]  Physical Therapy Treatment Progress Note Reporting Period 06/25/20 to 07/30/2020  See note below for Objective Data and Assessment of Progress/Goals.      Patient Details  Name: Kaitlin [name] MRN: [id number] Date of Birth: [birth year] Referring Provider (PT): Courtney Heys, MD   Encounter Date: 07/30/2020   PT End of Session - 07/30/20 1114     Visit Number 8    Number of Visits 28    Date for PT Re-Evaluation 09/12/20    Authorization Type UHC Medicare    Progress Note Due on Visit 18    PT Start Time 1101    PT Stop Time 1144    PT Time Calculation (min) 43 min    Activity Tolerance Patient tolerated treatment well    Behavior During Therapy WFL for tasks assessed/performed             Past Medical History:  Diagnosis Date   Anemia    Bronchitis    Cataracts, bilateral 01/12/2018   CHF (congestive heart failure) ([place])    Diabetes mellitus    High cholesterol    Hypertension    takes medicine to protect kidneys, does not have HTN   Leukocytosis    Morbid obesity ([place]) 09/11/2011   BMI 57   Neuropathy 01/12/2018   NICM (nonischemic cardiomyopathy) ([place]) 05/01/2013   Overview:  Last Assessment & Plan:  euvolemic on physical exam.    Obesity    Sickle cell trait ([place])    Thymoma     Past Surgical History:  Procedure Laterality Date   COLONOSCOPY N/A 06/21/2017   Procedure: COLONOSCOPY;  Surgeon: Ronnette Juniper, MD;  [facility];  Service: Gastroenterology;  Laterality: N/A;   LEFT HEART CATHETERIZATION WITH CORONARY ANGIOGRAM N/A 05/25/2013   Procedure: LEFT HEART CATHETERIZATION WITH CORONARY ANGIOGRAM;  Surgeon: Troy Sine, MD;  Location: [facility] CATH LAB;  Service: Cardiovascular;  Laterality: N/A;   LYMPH NODE BIOPSY Right 11/30/2012   Procedure: RIGHT TONSIL BIOPSY WITH FRESH FROZEN ANALYSIS;  Surgeon:  Jodi Marble, MD;  Location: [place];  Service: ENT;  Laterality: Right;   MEDIASTERNOTOMY N/A 01/12/2013   Procedure: MEDIAN STERNOTOMY;  Surgeon: Gaye Pollack, MD;  Location: [place];  Service: Thoracic;  Laterality: N/A;   MEDIASTINOTOMY CHAMBERLAIN MCNEIL    Right 11/06/2012   Procedure: MEDIASTINOTOMY CHAMBERLAIN MCNEIL PROCEDURE;  Surgeon: Gaye Pollack, MD;  Location: [place];  Service: Thoracic;  Laterality: Right;   POLYPECTOMY  06/21/2017   Procedure: POLYPECTOMY;  Surgeon: Ronnette Juniper, MD;  Location: [facility];  Service: Gastroenterology;;   RESECTION OF A THYMOMA N/A 01/12/2013   Procedure: RESECTION OF A THYMOMA;  Surgeon: Gaye Pollack, MD;  [facility];  Service: Thoracic;  Laterality: N/A;   TONSILLECTOMY     TUBAL LIGATION      There were no vitals filed for this visit.   Subjective Assessment - 07/30/20 1106     Subjective I am becoming more aware of muscle use from pool because it hurts so bad to do it outside of the water. I still have stiffness but it does not feel as bad. pain is still present but much less intense than day 1.    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 The last few  times I have taken a shower I am not needing to lean as much for support    How long can you walk comfortably? 150' approx. back into therapy gym (eval)     today was able to walk from parking to waiting lobby comfortably (approx 300 ft)    Patient Stated Goals wants to be able to walk normal and not feel like she is going to topple over. standing up is difficult    Currently in Pain? Yes    Pain Score 6     Pain Location Back    Pain Orientation Upper    Pain Descriptors / Indicators Aching;Sore    Aggravating Factors  being tense, hard to say    Pain Relieving Factors lay down                [facility] PT Assessment - 07/30/20 0001       Assessment   Medical Diagnosis  Long COVID.    Referring Provider (PT) Courtney Heys, MD    Onset Date/Surgical Date 01/12/19    Hand Dominance Right    Prior Therapy inpatient rehab, HHOT/PT      Precautions   Precautions None      Restrictions   Weight Bearing Restrictions No      Balance Screen   Has the patient fallen in the past 6 months No      [place] residence    Living Arrangements Other (Comment)    Available Help at Discharge Family    Type of [place] to enter    Entrance Stairs-Number of Steps 18    Entrance Stairs-Rails Can reach both    Additional Comments daughter does most of the cleaning, can bathe and dress, just takes a lot of effort and energy, takes longer than what she is used to, cares for grand kids: 2, 4 & 9      Prior Function   Level of Independence Independent    Vocation On disability    [place], grandchildren      AROM   Overall AROM Comments Lt GHJ flx 115, Rt 140; GHJ ABD Lt 110, Rt 135      6 Minute Walk- Baseline   HR (bpm) 118    Modified Borg Scale for Dyspnea 0- Nothing at all      6 Minute walk- Post Test   6 Minute Walk Post Test --   2 seated rest breaks in test   HR (bpm) 143    02 Sat (%RA) 93 %    Modified Borg Scale for Dyspnea 8-      6 minute walk test results    Aerobic Endurance Distance Walked 690      Standardized Balance Assessment   Five times sit to stand comments  15s                                   PT Education - 07/30/20 1550     Education Details goals, progress, POC, improving mobility, aquatic prog to land    Person(s) Educated Patient    Methods Explanation    Comprehension Verbalized understanding;Need further instruction              PT Short Term Goals - 07/23/20 1248       PT SHORT TERM GOAL #1   Title Pt  will be independent with initial HEP    Status Achieved      PT SHORT TERM GOAL #2   Title Pt will be  ambulating 2 minutes at least 3x in the day for home walking program    Status Achieved      PT SHORT TERM GOAL #3   Title Pt will demo improved double leg press strength with increased 1 RM to 220 lbs    Status Unable to assess               PT Long Term Goals - 07/30/20 1137       PT LONG TERM GOAL #1   Title Pt will be independent with continuing to self progress her exercises at home    Baseline requires further progression, being more mobile    Status On-going      PT LONG TERM GOAL #2   Title Pt will improve 2 MWT to at least 300' to demo improved walking endurance; revised 6MWT to improve by [place]    Baseline 246' on 06/25/20, 690 on 7/20 for 6MWT, MDC 198.98, normative value for her age not available- value for 55 yo is 28'    Status Revised      PT LONG TERM GOAL #3   Title Pt will demo 5x STS of <13 sec to demo improved functional LE strength    Baseline 15s    Status On-going                   Plan - 07/30/20 1551     Clinical Impression Statement Land progress note performed today. Pt demo significant improvement in mobility and is able to maintain appropriate O2 sats. HR is still increasing more than desired but will cont to improve with strength and endurance training. Will incr her frequency in the pool/week over the next 9 visits to increase challenge and then re-evaluate. She is able to move more with her grandchildren but would like to continue in water as she is able to perform exercises more effectively with the assist from the water. As she continues to improve, and can tolerate land based exercise, we will progress to land full time. Asked her to begin her search and consideration for pool following d/c if that is an exercise regimen she would like to continue with.    PT Frequency 3x / week    PT Duration 6 weeks    PT Treatment/Interventions ADLs/Self Care Home Management;DME Instruction;Gait training;Stair training;Functional mobility  training;Therapeutic activities;Balance training;Therapeutic exercise;Neuromuscular re-education;Patient/family education;Energy conservation    PT Next Visit Plan endurance, power- watching HR    PT Home Exercise Plan [id number]    Consulted and Agree with Plan of Care Patient             Patient will benefit from skilled therapeutic intervention in order to improve the following deficits and impairments:  Abnormal gait, Cardiopulmonary status limiting activity, Decreased activity tolerance, Decreased balance, Decreased coordination, Decreased endurance, Decreased mobility, Difficulty walking, Decreased strength, Impaired sensation, Postural dysfunction, Obesity, Impaired UE functional use, Decreased range of motion, Pain  Visit Diagnosis: Muscle weakness (generalized)  Unsteadiness on feet  Other abnormalities of gait and mobility  Difficulty in walking, not elsewhere classified     Problem List Patient Active Problem List   Diagnosis Date Noted   Long COVID 01/02/2020   Mixed stress and urge urinary incontinence 07/04/2019   Nerve pain 05/16/2019   Myofascial pain 05/16/2019   Subluxation of acromioclavicular  joint, right, sequela 03/23/2019   Sickle cell trait ([place])    Labile blood glucose    Diabetic peripheral neuropathy (HCC)    Hypokalemia    Pain in joint of left shoulder    Debility    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   COVID-19    History of anemia    Super obese    Poorly controlled type 2 diabetes mellitus with peripheral neuropathy ([place])    Acute on chronic diastolic (congestive) heart failure (HCC)    Hypernatremia    Shock circulatory ([place])    Septic shock (HCC)    Hypotension 01/13/2019   Acute respiratory failure with hypoxemia ([place]) 01/13/2019   Acute respiratory distress syndrome (ARDS) due to COVID-19 virus ([place]) 01/12/2019   Grade I diastolic dysfunction 07/11/1599   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   Dyspnea on exertion 05/19/2016    Other fatigue 05/19/2016   Absolute anemia 04/15/2016   Left leg weakness 10/18/2014   Left leg paresthesias 10/18/2014   Acute left-sided weakness 10/18/2014   Encounter for central line placement 03/08/2014   Taking drug for chronic disease 03/08/2014   H/O insertion of insulin pump 02/18/2014   Long term current use of insulin ([place]) 02/18/2014   Insulin pump in place 02/18/2014   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   Abnormal ballistocardiogram 05/18/2013   Abnormal nuclear stress test 05/18/2013   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   NICM (nonischemic cardiomyopathy) ([place]) 05/01/2013   Insulin dependent type 2 diabetes mellitus ([place]) 04/10/2013   Sex counseling 04/10/2013   Edema leg 04/10/2013   Upper respiratory infection, viral 04/10/2013   Pedal edema 03/29/2013   Pneumonia 12/12/2012   Essential hypertension 12/12/2012   HLD (hyperlipidemia) 12/12/2012   Tonsillar mass 12/01/2012   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 Jimi Schappert PT, DPT 07/30/20 3:58 PM   [facility] [address] [place], [place], [address] Phone: [phone]   Fax:  [phone]  Name: Kaitlin [name] MRN: [id number] Date of Birth: Jun 20, 1965

## 2020-08-01 ENCOUNTER — Ambulatory Visit (HOSPITAL_BASED_OUTPATIENT_CLINIC_OR_DEPARTMENT_OTHER): Payer: Medicare Other | Admitting: Physical Therapy

## 2020-08-01 ENCOUNTER — Other Ambulatory Visit: Payer: Self-pay

## 2020-08-01 DIAGNOSIS — M79602 Pain in left arm: Secondary | ICD-10-CM

## 2020-08-01 DIAGNOSIS — R41840 Attention and concentration deficit: Secondary | ICD-10-CM

## 2020-08-01 DIAGNOSIS — M6281 Muscle weakness (generalized): Secondary | ICD-10-CM

## 2020-08-01 DIAGNOSIS — R262 Difficulty in walking, not elsewhere classified: Secondary | ICD-10-CM

## 2020-08-01 DIAGNOSIS — M79601 Pain in right arm: Secondary | ICD-10-CM

## 2020-08-01 DIAGNOSIS — R2681 Unsteadiness on feet: Secondary | ICD-10-CM

## 2020-08-01 DIAGNOSIS — M25612 Stiffness of left shoulder, not elsewhere classified: Secondary | ICD-10-CM

## 2020-08-01 DIAGNOSIS — R2689 Other abnormalities of gait and mobility: Secondary | ICD-10-CM

## 2020-08-01 DIAGNOSIS — G7281 Critical illness myopathy: Secondary | ICD-10-CM

## 2020-08-01 NOTE — Therapy (Signed)
[facility] [address] [place], [place], [address] Phone: [phone]   Fax:  [phone]  Physical Therapy Treatment  Patient Details  Name: TANJANIKA SJOBERG MRN: [id number] Date of Birth: 08/27/1965 Referring Provider (PT): Courtney Heys, MD   Encounter Date: 08/01/2020   PT End of Session - 08/01/20 1039     Visit Number 9    Number of Visits 28    Date for PT Re-Evaluation 09/12/20    Authorization Type UHC Medicare    PT Start Time 1036    PT Stop Time 1117    PT Time Calculation (min) 41 min    Equipment Utilized During Treatment Other (comment)    Activity Tolerance Patient tolerated treatment well    Behavior During Therapy [facility] for tasks assessed/performed             Past Medical History:  Diagnosis Date   Anemia    Bronchitis    Cataracts, bilateral 01/12/2018   CHF (congestive heart failure) ([place])    Diabetes mellitus    High cholesterol    Hypertension    takes medicine to protect kidneys, does not have HTN   Leukocytosis    Morbid obesity ([place]) 09/11/2011   BMI 57   Neuropathy 01/12/2018   NICM (nonischemic cardiomyopathy) ([place]) 05/01/2013   Overview:  Last Assessment & Plan:  euvolemic on physical exam.    Obesity    Sickle cell trait ([place])    Thymoma     Past Surgical History:  Procedure Laterality Date   COLONOSCOPY N/A 06/21/2017   Procedure: COLONOSCOPY;  Surgeon: Ronnette Juniper, MD;  [facility];  Service: Gastroenterology;  Laterality: N/A;   LEFT HEART CATHETERIZATION WITH CORONARY ANGIOGRAM N/A 05/25/2013   Procedure: LEFT HEART CATHETERIZATION WITH CORONARY ANGIOGRAM;  Surgeon: Troy Sine, MD;  Location: [facility] CATH LAB;  Service: Cardiovascular;  Laterality: N/A;   LYMPH NODE BIOPSY Right 11/30/2012   Procedure: RIGHT TONSIL BIOPSY WITH FRESH FROZEN ANALYSIS;  Surgeon: Jodi Marble, MD;  Location: [place];  Service: ENT;  Laterality: Right;   MEDIASTERNOTOMY N/A 01/12/2013    Procedure: MEDIAN STERNOTOMY;  Surgeon: Gaye Pollack, MD;  Location: [place];  Service: Thoracic;  Laterality: N/A;   MEDIASTINOTOMY CHAMBERLAIN MCNEIL    Right 11/06/2012   Procedure: MEDIASTINOTOMY CHAMBERLAIN MCNEIL PROCEDURE;  Surgeon: Gaye Pollack, MD;  Location: [place];  Service: Thoracic;  Laterality: Right;   POLYPECTOMY  06/21/2017   Procedure: POLYPECTOMY;  Surgeon: Ronnette Juniper, MD;  Location: [facility];  Service: Gastroenterology;;   RESECTION OF A THYMOMA N/A 01/12/2013   Procedure: RESECTION OF A THYMOMA;  Surgeon: Gaye Pollack, MD;  [facility];  Service: Thoracic;  Laterality: N/A;   TONSILLECTOMY     TUBAL LIGATION      There were no vitals filed for this visit.   Subjective Assessment - 08/01/20 1347     Subjective "I'm ok  tired today"                                         PT Short Term Goals - 07/23/20 1248       PT SHORT TERM GOAL #1   Title Pt will be independent with initial HEP    Status Achieved      PT SHORT TERM GOAL #2   Title Pt will be ambulating 2  minutes at least 3x in the day for home walking program    Status Achieved      PT SHORT TERM GOAL #3   Title Pt will demo improved double leg press strength with increased 1 RM to 220 lbs    Status Unable to assess               PT Long Term Goals - 07/30/20 1137       PT LONG TERM GOAL #1   Title Pt will be independent with continuing to self progress her exercises at home    Baseline requires further progression, being more mobile    Status On-going      PT LONG TERM GOAL #2   Title Pt will improve 2 MWT to at least 300' to demo improved walking endurance; revised 6MWT to improve by [facility]    Baseline 246' on 06/25/20, 690 on 7/20 for 6MWT, MDC 198.98, normative value for her age not available- value for 55 yo is 39'    Status Revised      PT LONG TERM GOAL #3   Title Pt will demo 5x STS of <13 sec to demo improved functional LE strength     Baseline 15s    Status On-going               Pt seen for aquatic therapy today.  Treatment took place in water 3.25-4.8 ft in depth at the [employer] pool. Temp of water was 91.  Pt entered/exited the pool via stairs step to pattern CGA with bilat rail.     Warm up: forward, backward and side stepping/walking cues for increased step length, increased speed, hand placement to increase resistance. Pt with floatation belt holding to noodle then HHA.   Seated Strengthening of hamstring, gastroc, and hip adductors Knee flex and extension resisted by water x 10 reps RLE resisted by ankle buoys x 10 reps with cuing for increased knee flex speed. Aerobic capacity training kicking 3 x 20 reps.   Standing Kick board push downs 2 x 10 rep cuing for tight core Add/abd x 10 reps, hip flex stretch 3 x 20 seconds f/b knee pull through for hip flex str x 10 reps Hip ext  x10 Jumping /ankle df/pf 40% submerged 2 x 5 reps Add/abd x 10 Hip extension x 10 Cueing for tight core and increased speed with all exercises more maximal strengthening and aerobic capacity benefit.   Warm down Water walking       Pt requires buoyancy for support and to offload joints with strengthening exercises. Viscosity of the water is needed for resistance of strengthening; water current perturbations provides challenge to standing balance unsupported, requiring increased core activation.         Plan - 08/01/20 1052     Clinical Impression Statement Pt requiring multiple recovery periods throught session today due to increased fatigue             Patient will benefit from skilled therapeutic intervention in order to improve the following deficits and impairments:     Visit Diagnosis: Muscle weakness (generalized)  Stiffness of left shoulder, not elsewhere classified  Unsteadiness on feet  Other abnormalities of gait and mobility  Difficulty in walking, not elsewhere  classified  Attention and concentration deficit  Pain in right arm  Pain in left arm  Critical illness myopathy     Problem List Patient Active Problem List   Diagnosis Date Noted   Long COVID  01/02/2020   Mixed stress and urge urinary incontinence 07/04/2019   Nerve pain 05/16/2019   Myofascial pain 05/16/2019   Subluxation of acromioclavicular joint, right, sequela 03/23/2019   Sickle cell trait ([place])    Labile blood glucose    Diabetic peripheral neuropathy (HCC)    Hypokalemia    Pain in joint of left shoulder    Debility    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   COVID-19    History of anemia    Super obese    Poorly controlled type 2 diabetes mellitus with peripheral neuropathy (HCC)    Acute on chronic diastolic (congestive) heart failure (HCC)    Hypernatremia    Shock circulatory (HCC)    Septic shock (HCC)    Hypotension 01/13/2019   Acute respiratory failure with hypoxemia ([place]) 01/13/2019   Acute respiratory distress syndrome (ARDS) due to COVID-19 virus ([place]) 01/12/2019   Grade I diastolic dysfunction [id number]   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   Dyspnea on exertion 05/19/2016   Other fatigue 05/19/2016   Absolute anemia 04/15/2016   Left leg weakness 10/18/2014   Left leg paresthesias 10/18/2014   Acute left-sided weakness 10/18/2014   Encounter for central line placement 03/08/2014   Taking drug for chronic disease 03/08/2014   H/O insertion of insulin pump 02/18/2014   Long term current use of insulin ([place]) 02/18/2014   Insulin pump in place 02/18/2014   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   Abnormal ballistocardiogram 05/18/2013   Abnormal nuclear stress test 05/18/2013   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   NICM (nonischemic cardiomyopathy) ([place]) 05/01/2013   Insulin dependent type 2 diabetes mellitus ([place]) 04/10/2013   Sex counseling 04/10/2013   Edema leg 04/10/2013   Upper respiratory infection, viral 04/10/2013   Pedal edema  03/29/2013   Pneumonia 12/12/2012   Essential hypertension 12/12/2012   HLD (hyperlipidemia) 12/12/2012   Tonsillar mass 12/01/2012   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

## 2020-08-04 ENCOUNTER — Ambulatory Visit (HOSPITAL_BASED_OUTPATIENT_CLINIC_OR_DEPARTMENT_OTHER): Payer: Medicare Other | Admitting: Physical Therapy

## 2020-08-05 ENCOUNTER — Ambulatory Visit (HOSPITAL_BASED_OUTPATIENT_CLINIC_OR_DEPARTMENT_OTHER): Payer: Medicare Other | Admitting: Physical Therapy

## 2020-08-05 ENCOUNTER — Ambulatory Visit
Admission: RE | Admit: 2020-08-05 | Discharge: 2020-08-05 | Disposition: A | Discharge status: Home / Self Care | Payer: Medicare Other | Source: Ambulatory Visit | Attending: Physician Assistant | Admitting: Physician Assistant

## 2020-08-05 ENCOUNTER — Other Ambulatory Visit: Payer: Self-pay | Admitting: Physical Medicine and Rehabilitation

## 2020-08-05 ENCOUNTER — Other Ambulatory Visit: Payer: Self-pay

## 2020-08-05 ENCOUNTER — Encounter (HOSPITAL_BASED_OUTPATIENT_CLINIC_OR_DEPARTMENT_OTHER): Payer: Self-pay | Admitting: Physical Therapy

## 2020-08-05 DIAGNOSIS — M6281 Muscle weakness (generalized): Secondary | ICD-10-CM | POA: Diagnosis not present

## 2020-08-05 DIAGNOSIS — R2681 Unsteadiness on feet: Secondary | ICD-10-CM

## 2020-08-05 DIAGNOSIS — N63 Unspecified lump in unspecified breast: Secondary | ICD-10-CM

## 2020-08-05 DIAGNOSIS — R2689 Other abnormalities of gait and mobility: Secondary | ICD-10-CM

## 2020-08-05 DIAGNOSIS — R262 Difficulty in walking, not elsewhere classified: Secondary | ICD-10-CM

## 2020-08-05 NOTE — Therapy (Signed)
[facility] [address] [place], [place], [address] Phone: [phone]   Fax:  [phone]  Physical Therapy Treatment  Patient Details  Name: Kaitlin [name] MRN: [id number] Date of Birth: [birth year] Referring Provider (PT): Courtney Heys, MD   Encounter Date: 08/05/2020   PT End of Session - 08/05/20 1056     Visit Number 10    Number of Visits 28    Date for PT Re-Evaluation 09/12/20    PT Start Time [id number]    PT Stop Time 1115    PT Time Calculation (min) 40 min    Equipment Utilized During Treatment Other (comment)    Activity Tolerance Patient tolerated treatment well    Behavior During Therapy [facility] for tasks assessed/performed             Past Medical History:  Diagnosis Date   Anemia    Bronchitis    Cataracts, bilateral 01/12/2018   CHF (congestive heart failure) ([place])    Diabetes mellitus    High cholesterol    Hypertension    takes medicine to protect kidneys, does not have HTN   Leukocytosis    Morbid obesity ([place]) 09/11/2011   BMI 57   Neuropathy 01/12/2018   NICM (nonischemic cardiomyopathy) ([place]) 05/01/2013   Overview:  Last Assessment & Plan:  euvolemic on physical exam.    Obesity    Sickle cell trait ([place])    Thymoma     Past Surgical History:  Procedure Laterality Date   COLONOSCOPY N/A 06/21/2017   Procedure: COLONOSCOPY;  Surgeon: Ronnette Juniper, MD;  Location: [facility];  Service: Gastroenterology;  Laterality: N/A;   LEFT HEART CATHETERIZATION WITH CORONARY ANGIOGRAM N/A 05/25/2013   Procedure: LEFT HEART CATHETERIZATION WITH CORONARY ANGIOGRAM;  Surgeon: Troy Sine, MD;  Location: [facility] C[facility] LAB;  Service: Cardiovascular;  Laterality: N/A;   LYMPH NODE BIOPSY Right 11/30/2012   Procedure: RIGHT TONSIL BIOPSY WITH FRESH FROZEN ANALYSIS;  Surgeon: Jodi Marble, MD;  Location: [facility];  Service: ENT;  Laterality: Right;   MEDIASTERNOTOMY N/A 01/12/2013   Procedure: MEDIAN STERNOTOMY;   Surgeon: Gaye Pollack, MD;  Location: [place] [facility];  Service: Thoracic;  Laterality: N/A;   MEDIASTINOTOMY CHAMBERLAIN MCNEIL    Right 11/06/2012   Procedure: MEDIASTINOTOMY CHAMBERLAIN MCNEIL PROCEDURE;  Surgeon: Gaye Pollack, MD;  Location: [facility];  Service: Thoracic;  Laterality: Right;   POLYPECTOMY  06/21/2017   Procedure: POLYPECTOMY;  Surgeon: Ronnette Juniper, MD;  Location: [facility];  Service: Gastroenterology;;   RESECTION OF A THYMOMA N/A 01/12/2013   Procedure: RESECTION OF A THYMOMA;  Surgeon: Gaye Pollack, MD;  [facility];  Service: Thoracic;  Laterality: N/A;   TONSILLECTOMY     TUBAL LIGATION      There were no vitals filed for this visit.   Subjective Assessment - 08/05/20 1056     Subjective Not hurting as bad, stil tired    Currently in Pain? Yes    Pain Score 5     Pain Location Back    Pain Orientation Upper    Pain Descriptors / Indicators Aching    Pain Type Chronic pain    Pain Onset More than a month ago    Pain Frequency Intermittent  PT Short Term Goals - 07/23/20 1248       PT SHORT TERM GOAL #1   Title Pt will be independent with initial HEP    Status Achieved      PT SHORT TERM GOAL #2   Title Pt will be ambulating 2 minutes at least 3x in the day for home walking program    Status Achieved      PT SHORT TERM GOAL #3   Title Pt will demo improved double leg press strength with increased 1 RM to 220 lbs    Status Unable to assess               PT Long Term Goals - 07/30/20 1137       PT LONG TERM GOAL #1   Title Pt will be independent with continuing to self progress her exercises at home    Baseline requires further progression, being more mobile    Status On-going      PT LONG TERM GOAL #2   Title Pt will improve 2 MWT to at least 300' to demo improved walking endurance; revised 6MWT to improve by [facility]    Baseline 246' on 06/25/20, 690 on 7/20 for 6MWT, MDC  198.98, normative value for her age not available- value for 55 yo is 7'    Status Revised      PT LONG TERM GOAL #3   Title Pt will demo 5x STS of <13 sec to demo improved functional LE strength    Baseline 15s    Status On-going           Pt seen for aquatic therapy today.  Treatment took place in water 3.25-4.8 ft in depth at the [facility] pool. Temp of water was 91.  Pt entered/exited the pool via stairs step to pattern CGA with bilat rail.     Warm up: forward, backward and side stepping/walking cues for increased step length, increased speed, hand placement to increase resistance. Pt with floatation belt holding to noodle then HHA.   Seated Stretching of hamstring, gastroc, and hip adductors Knee flex and extension resisted by water x 10 reps RLE resisted by ankle buoys x 10 reps with cuing for increased knee flex speed. Flutter kicking 3 x 20 rep seated on 3 step holding to handrails    Standing Kick board push downs 3 x 10 rep cuing for tight core Lateral rotations with kick board for strengthening of core rotators x 10 reps each direction.  Verbal cues and demonstration fpr tech Add/abd 2 x 10 reps, hip flex stretch 3 x 20 seconds    Hip ext  x10 Jumping /ankle df/pf 40% submerged 10 rep Hip extension x 10 Cueing for tight core and increased speed with all exercises more maximal strengthening and aerobic capacity benefit.   Water walking multiple laps between exercises. Pt also requires 3-4 seated rest periods.       Pt requires buoyancy for support and to offload joints with strengthening exercises. Viscosity of the water is needed for resistance of strengthening; water current perturbations provides challenge to standing balance unsupported, requiring increased core activation.        Plan - 08/05/20 1122     Clinical Impression Statement Pt participates with improved energy today.  She completes session demonstarting increased strength and  endurance as evidenced by tolerating increased reps of exercise and ability to increase speed/resistence with reps/ fewer rest periods needed.. No c/o pain throughout session.  Personal Factors and Comorbidities Comorbidity 3+;Time since onset of injury/illness/exacerbation;Fitness;Past/Current Experience    Stability/Clinical Decision Making Evolving/Moderate complexity    Clinical Decision Making Moderate    Rehab Potential Good    PT Frequency 3x / week    PT Treatment/Interventions ADLs/Self Care Home Management;DME Instruction;Gait training;Stair training;Functional mobility training;Therapeutic activities;Balance training;Therapeutic exercise;Neuromuscular re-education;Patient/family education;Energy conservation    PT Home Exercise Plan [id number]             Patient will benefit from skilled therapeutic intervention in order to improve the following deficits and impairments:  Abnormal gait, Cardiopulmonary status limiting activity, Decreased activity tolerance, Decreased balance, Decreased coordination, Decreased endurance, Decreased mobility, Difficulty walking, Decreased strength, Impaired sensation, Postural dysfunction, Obesity, Impaired UE functional use, Decreased range of motion, Pain  Visit Diagnosis: Muscle weakness (generalized)  Difficulty in walking, not elsewhere classified  Unsteadiness on feet  Other abnormalities of gait and mobility     Problem List Patient Active Problem List   Diagnosis Date Noted   Long COVID 01/02/2020   Mixed stress and urge urinary incontinence 07/04/2019   Nerve pain 05/16/2019   Myofascial pain 05/16/2019   Subluxation of acromioclavicular joint, right, sequela 03/23/2019   Sickle cell trait ([place])    Labile blood glucose    Diabetic peripheral neuropathy (HCC)    Hypokalemia    Pain in joint of left shoulder    Debility    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   COVID-19    History of anemia    Super obese    Poorly  controlled type 2 diabetes mellitus with peripheral neuropathy ([place])    Acute on chronic diastolic (congestive) heart failure (HCC)    Hypernatremia    Shock circulatory (HCC)    Septic shock (HCC)    Hypotension 01/13/2019   Acute respiratory failure with hypoxemia ([place]) 01/13/2019   Acute respiratory distress syndrome (ARDS) due to COVID-19 virus ([place]) 01/12/2019   Grade I diastolic dysfunction [id number]   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   Dyspnea on exertion 05/19/2016   Other fatigue 05/19/2016   Absolute anemia 04/15/2016   Left leg weakness 10/18/2014   Left leg paresthesias 10/18/2014   Acute left-sided weakness 10/18/2014   Encounter for central line placement 03/08/2014   Taking drug for chronic disease 03/08/2014   H/O insertion of insulin pump 02/18/2014   Long term current use of insulin ([place]) 02/18/2014   Insulin pump in place 02/18/2014   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   Abnormal ballistocardiogram 05/18/2013   Abnormal nuclear stress test 05/18/2013   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   NICM (nonischemic cardiomyopathy) ([place]) 05/01/2013   Insulin dependent type 2 diabetes mellitus ([place]) 04/10/2013   Sex counseling 04/10/2013   Edema leg 04/10/2013   Upper respiratory infection, viral 04/10/2013   Pedal edema 03/29/2013   Pneumonia 12/12/2012   Essential hypertension 12/12/2012   HLD (hyperlipidemia) 12/12/2012   Tonsillar mass 12/01/2012   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 [place], [place], [address] Phone: [phone]   Fax:  [phone]  Name: Kaitlin [name] MRN: [id number] Date  of Birth: [birth year]

## 2020-08-07 ENCOUNTER — Other Ambulatory Visit: Payer: Self-pay

## 2020-08-07 ENCOUNTER — Ambulatory Visit (HOSPITAL_BASED_OUTPATIENT_CLINIC_OR_DEPARTMENT_OTHER): Payer: Medicare Other | Admitting: Physical Therapy

## 2020-08-07 ENCOUNTER — Encounter (HOSPITAL_BASED_OUTPATIENT_CLINIC_OR_DEPARTMENT_OTHER): Payer: Self-pay | Admitting: Physical Therapy

## 2020-08-07 DIAGNOSIS — R2689 Other abnormalities of gait and mobility: Secondary | ICD-10-CM

## 2020-08-07 DIAGNOSIS — M6281 Muscle weakness (generalized): Secondary | ICD-10-CM

## 2020-08-07 DIAGNOSIS — R2681 Unsteadiness on feet: Secondary | ICD-10-CM

## 2020-08-07 DIAGNOSIS — R262 Difficulty in walking, not elsewhere classified: Secondary | ICD-10-CM

## 2020-08-07 NOTE — Therapy (Signed)
[facility] [address] [place], [place], [address] Phone: [phone]   Fax:  [phone]  Physical Therapy Treatment  Patient Details  Name: Kaitlin [name] MRN: [id number] Date of Birth: 1965/12/14 Referring Provider (PT): Courtney Heys, MD   Encounter Date: 08/07/2020   PT End of Session - 08/07/20 1105     Visit Number 11    Number of Visits 28    Date for PT Re-Evaluation 09/12/20    Authorization Type UHC Medicare    Progress Note Due on Visit 18    PT Start Time 1101    PT Stop Time 1142    PT Time Calculation (min) 41 min    Activity Tolerance Patient tolerated treatment well    Behavior During Therapy [facility] for tasks assessed/performed             Past Medical History:  Diagnosis Date   Anemia    Bronchitis    Cataracts, bilateral 01/12/2018   CHF (congestive heart failure) ([place])    Diabetes mellitus    High cholesterol    Hypertension    takes medicine to protect kidneys, does not have HTN   Leukocytosis    Morbid obesity ([place]) 09/11/2011   BMI 57   Neuropathy 01/12/2018   NICM (nonischemic cardiomyopathy) ([place]) 05/01/2013   Overview:  Last Assessment & Plan:  euvolemic on physical exam.    Obesity    Sickle cell trait ([place])    Thymoma     Past Surgical History:  Procedure Laterality Date   COLONOSCOPY N/A 06/21/2017   Procedure: COLONOSCOPY;  Surgeon: Ronnette Juniper, MD;  [facility];  Service: Gastroenterology;  Laterality: N/A;   LEFT HEART CATHETERIZATION WITH CORONARY ANGIOGRAM N/A 05/25/2013   Procedure: LEFT HEART CATHETERIZATION WITH CORONARY ANGIOGRAM;  Surgeon: Troy Sine, MD;  [facility] C[facility];  Service: Cardiovascular;  Laterality: N/A;   LYMPH NODE BIOPSY Right 11/30/2012   Procedure: RIGHT TONSIL BIOPSY WITH FRESH FROZEN ANALYSIS;  Surgeon: Jodi Marble, MD;  Location: [facility];  Service: ENT;  Laterality: Right;   MEDIASTERNOTOMY N/A 01/12/2013   Procedure: MEDIAN  STERNOTOMY;  Surgeon: Gaye Pollack, MD;  Location: [facility];  Service: Thoracic;  Laterality: N/A;   MEDIASTINOTOMY CHAMBERLAIN MCNEIL    Right 11/06/2012   Procedure: MEDIASTINOTOMY CHAMBERLAIN MCNEIL PROCEDURE;  Surgeon: Gaye Pollack, MD;  Location: [facility];  Service: Thoracic;  Laterality: Right;   POLYPECTOMY  06/21/2017   Procedure: POLYPECTOMY;  Surgeon: Ronnette Juniper, MD;  Location[facility];  Service: Gastroenterology;;   RESECTION OF A THYMOMA N/A 01/12/2013   Procedure: RESECTION OF A THYMOMA;  Surgeon: Gaye Pollack, MD;  [facility];  Service: Thoracic;  Laterality: N/A;   TONSILLECTOMY     TUBAL LIGATION      There were no vitals filed for this visit.   Subjective Assessment - 08/07/20 1104     Subjective upper back and Rt knee are hurting a little    Currently in Pain? Yes    Pain Score 4     Pain Location Back    Pain Orientation Upper    Pain Descriptors / Indicators Aching                    AquaticREHABdocumentation: Water will provide increased arousal using the property of surface tension as this patient struggles with lethargy which impairs the cognitive processing., Pt requires the buoyancy of water for active assisted exercises with  buoyancy supported for strengthening & ROM exercises, and Hydrostatic pressure also supports joints by unweighting joint load by at least 50 % in 3-4 feet depth water. 80% in chest to neck deep water.  Seated bicycles 3x1 min Sit<> stand arms fwd 3/10 Flutter kick planks at 3'6"- moved to 4' after a few trials- able to consistently hold 4s Double yellow water dumbbell press down along sides Abd/add double yellow dumbbells GHJ ER/IR water resist with back against wall Heel raises- hands on edge of pool Gastroc stretch in lunge x30s eac hip circles 3x10/10 ea Quick marches- single leg 3x10- will eventually progress to alternating when appropriate Dead lift motion pushing water bar  down                  PT Education - 08/07/20 1143     Education Details progress and looking forward    Person(s) Educated Patient    Methods Explanation    Comprehension Verbalized understanding;Need further instruction              PT Short Term Goals - 07/23/20 1248       PT SHORT TERM GOAL #1   Title Pt will be independent with initial HEP    Status Achieved      PT SHORT TERM GOAL #2   Title Pt will be ambulating 2 minutes at least 3x in the day for home walking program    Status Achieved      PT SHORT TERM GOAL #3   Title Pt will demo improved double leg press strength with increased 1 RM to 220 lbs    Status Unable to assess               PT Long Term Goals - 07/30/20 1137       PT LONG TERM GOAL #1   Title Pt will be independent with continuing to self progress her exercises at home    Baseline requires further progression, being more mobile    Status On-going      PT LONG TERM GOAL #2   Title Pt will improve 2 MWT to at least 300' to demo improved walking endurance; revised 6MWT to improve by [place]    Baseline 246' on 06/25/20, 690 on 7/20 for 6MWT, MDC 198.98, normative value for her age not available- value for 55 yo is 75'    Status Revised      PT LONG TERM GOAL #3   Title Pt will demo 5x STS of <13 sec to demo improved functional LE strength    Baseline 15s    Status On-going                   Plan - 08/07/20 1143     Clinical Impression Statement tolerated full session of exercise today without rest break. is planning to meet with a rep at the [facility] [employer] to discuss silver sneakers program. Discussed improvements and to be encouraged by changes.    PT Treatment/Interventions ADLs/Self Care Home Management;DME Instruction;Gait training;Stair training;Functional mobility training;Therapeutic activities;Balance training;Therapeutic exercise;Neuromuscular re-education;Patient/family education;Energy conservation    PT Next Visit  Plan endurance, power- watching HR    PT Home Exercise Plan [id number]    Consulted and Agree with Plan of Care Patient             Patient will benefit from skilled therapeutic intervention in order to improve the following deficits and impairments:  Abnormal gait, Cardiopulmonary status limiting activity, Decreased activity tolerance, Decreased  balance, Decreased coordination, Decreased endurance, Decreased mobility, Difficulty walking, Decreased strength, Impaired sensation, Postural dysfunction, Obesity, Impaired UE functional use, Decreased range of motion, Pain  Visit Diagnosis: Muscle weakness (generalized)  Difficulty in walking, not elsewhere classified  Unsteadiness on feet  Other abnormalities of gait and mobility     Problem List Patient Active Problem List   Diagnosis Date Noted   Long COVID 01/02/2020   Mixed stress and urge urinary incontinence 07/04/2019   Nerve pain 05/16/2019   Myofascial pain 05/16/2019   Subluxation of acromioclavicular joint, right, sequela 03/23/2019   Sickle cell trait ([place])    Labile blood glucose    Diabetic peripheral neuropathy (HCC)    Hypokalemia    Pain in joint of left shoulder    Debility    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   COVID-19    History of anemia    Super obese    Poorly controlled type 2 diabetes mellitus with peripheral neuropathy (HCC)    Acute on chronic diastolic (congestive) heart failure (HCC)    Hypernatremia    Shock circulatory ([place])    Septic shock (HCC)    Hypotension 01/13/2019   Acute respiratory failure with hypoxemia ([place]) 01/13/2019   Acute respiratory distress syndrome (ARDS) due to COVID-19 virus ([place]) 01/12/2019   Grade I diastolic dysfunction 123XX123   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   Dyspnea on exertion 05/19/2016   Other fatigue 05/19/2016   Absolute anemia 04/15/2016   Left leg weakness 10/18/2014   Left leg paresthesias 10/18/2014   Acute left-sided weakness 10/18/2014    Encounter for central line placement 03/08/2014   Taking drug for chronic disease 03/08/2014   H/O insertion of insulin pump 02/18/2014   Long term current use of insulin ([place]) 02/18/2014   Insulin pump in place 02/18/2014   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   Abnormal ballistocardiogram 05/18/2013   Abnormal nuclear stress test 05/18/2013   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   NICM (nonischemic cardiomyopathy) ([place]) 05/01/2013   Insulin dependent type 2 diabetes mellitus ([place]) 04/10/2013   Sex counseling 04/10/2013   Edema leg 04/10/2013   Upper respiratory infection, viral 04/10/2013   Pedal edema 03/29/2013   Pneumonia 12/12/2012   Essential hypertension 12/12/2012   HLD (hyperlipidemia) 12/12/2012   Tonsillar mass 12/01/2012   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 Bexlee Bergdoll PT, DPT 08/07/20 1:12 PM   [facility] [address] [place], [place], [address] Phone: [phone]   Fax:  [phone]  Name: Kaitlin [name] MRN: [id number] Date of Birth: 10/17/1965

## 2020-08-11 ENCOUNTER — Ambulatory Visit (HOSPITAL_BASED_OUTPATIENT_CLINIC_OR_DEPARTMENT_OTHER): Payer: Medicare Other | Attending: Physical Medicine and Rehabilitation | Admitting: Physical Therapy

## 2020-08-11 ENCOUNTER — Other Ambulatory Visit: Payer: Self-pay

## 2020-08-11 ENCOUNTER — Encounter (HOSPITAL_BASED_OUTPATIENT_CLINIC_OR_DEPARTMENT_OTHER): Payer: Self-pay | Admitting: Physical Therapy

## 2020-08-11 DIAGNOSIS — R262 Difficulty in walking, not elsewhere classified: Secondary | ICD-10-CM | POA: Diagnosis present

## 2020-08-11 DIAGNOSIS — M79602 Pain in left arm: Secondary | ICD-10-CM | POA: Insufficient documentation

## 2020-08-11 DIAGNOSIS — R41840 Attention and concentration deficit: Secondary | ICD-10-CM | POA: Diagnosis present

## 2020-08-11 DIAGNOSIS — R2681 Unsteadiness on feet: Secondary | ICD-10-CM | POA: Diagnosis present

## 2020-08-11 DIAGNOSIS — M6281 Muscle weakness (generalized): Secondary | ICD-10-CM | POA: Diagnosis not present

## 2020-08-11 DIAGNOSIS — M79601 Pain in right arm: Secondary | ICD-10-CM | POA: Insufficient documentation

## 2020-08-11 DIAGNOSIS — M25612 Stiffness of left shoulder, not elsewhere classified: Secondary | ICD-10-CM | POA: Insufficient documentation

## 2020-08-11 DIAGNOSIS — R2689 Other abnormalities of gait and mobility: Secondary | ICD-10-CM | POA: Insufficient documentation

## 2020-08-11 DIAGNOSIS — G7281 Critical illness myopathy: Secondary | ICD-10-CM | POA: Insufficient documentation

## 2020-08-12 NOTE — Therapy (Signed)
[facility] [address] [place], [place], [address] Phone: [phone]   Fax:  [phone]  Physical Therapy Treatment  Patient Details  Name: Kaitlin [name] MRN: [id number] Date of Birth: 01/21/1965 Referring Provider (PT): Courtney Heys, MD   Encounter Date: 08/11/2020   PT End of Session - 08/11/20 1225     Visit Number 12    Number of Visits 28    Date for PT Re-Evaluation 09/12/20    Authorization Type UHC Medicare    PT Start Time 1208    PT Stop Time 1253    PT Time Calculation (min) 45 min    Equipment Utilized During Treatment Other (comment)   ankle floats, yellow double DBs   Activity Tolerance Patient tolerated treatment well    Behavior During Therapy [facility] for tasks assessed/performed             Past Medical History:  Diagnosis Date   Anemia    Bronchitis    Cataracts, bilateral 01/12/2018   CHF (congestive heart failure) ([place])    Diabetes mellitus    High cholesterol    Hypertension    takes medicine to protect kidneys, does not have HTN   Leukocytosis    Morbid obesity ([place]) 09/11/2011   BMI 57   Neuropathy 01/12/2018   NICM (nonischemic cardiomyopathy) ([place]) 05/01/2013   Overview:  Last Assessment & Plan:  euvolemic on physical exam.    Obesity    Sickle cell trait ([place])    Thymoma     Past Surgical History:  Procedure Laterality Date   COLONOSCOPY N/A 06/21/2017   Procedure: COLONOSCOPY;  Surgeon: Ronnette Juniper, MD;  Location: [facility];  Service: Gastroenterology;  Laterality: N/A;   LEFT HEART CATHETERIZATION WITH CORONARY ANGIOGRAM N/A 05/25/2013   Procedure: LEFT HEART CATHETERIZATION WITH CORONARY ANGIOGRAM;  Surgeon: Troy Sine, MD;  Location: [facility] CATH LAB;  Service: Cardiovascular;  Laterality: N/A;   LYMPH NODE BIOPSY Right 11/30/2012   Procedure: RIGHT TONSIL BIOPSY WITH FRESH FROZEN ANALYSIS;  Surgeon: Jodi Marble, MD;  Location: [facility];  Service: ENT;  Laterality: Right;    MEDIASTERNOTOMY N/A 01/12/2013   Procedure: MEDIAN STERNOTOMY;  Surgeon: Gaye Pollack, MD;  Location: [facility];  Service: Thoracic;  Laterality: N/A;   MEDIASTINOTOMY CHAMBERLAIN MCNEIL    Right 11/06/2012   Procedure: MEDIASTINOTOMY CHAMBERLAIN MCNEIL PROCEDURE;  Surgeon: Gaye Pollack, MD;  Location: [facility];  Service: Thoracic;  Laterality: Right;   POLYPECTOMY  06/21/2017   Procedure: POLYPECTOMY;  Surgeon: Ronnette Juniper, MD;  Location: [facility];  Service: Gastroenterology;;   RESECTION OF A THYMOMA N/A 01/12/2013   Procedure: RESECTION OF A THYMOMA;  Surgeon: Gaye Pollack, MD;  Location: [facility] OR;  Service: Thoracic;  Laterality: N/A;   TONSILLECTOMY     TUBAL LIGATION      There were no vitals filed for this visit.   Subjective Assessment - 08/11/20 1211     Subjective Pt has a new monitor for measuring blood glucose and is unable to get the receiver wet or be more than 20 feet away from the receiver.  Pt was informed it is waterproof and she can get in the pool.  She also states the pamphlets indicate that it's waterproof.   Pt denies any adverse effects after prior Rx.  Pt reports improved stanidng tolerance including standing in the shower.  Pt states she still feels hunched over in the shower but is improving being able to stand  straighter.  Pt reports improved walking and walked from the parking garage today without stopping.    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  Blood Glucose monitor--needs to be w/n 20 feet of receiver    Currently in Pain? Yes    Pain Score 4     Pain Location --   upper back, R knee, and bilat shoulders            Pt seen for aquatic therapy today.  Treatment took place in water 3.5-4.8 ft in depth at the [facility] pool. Temp of water was 92.  Pt entered/exited the pool via stairs step to pattern CGA with bilat rail.     -Warm up: forward walking, backward walking, and  side stepping x 2 laps each without UE assist   -Seated: Knee flex and extension with ankle floats 2 x 10 reps  Bicycles  3 x 1 min  Sit <> stand arms fwd 3/10 -Standing Kick board push/pull 2 x 10 reps with cuing for tight core Lateral rotations with kick board for strengthening of core rotators x 10 reps each direction.  Hip flexion with knee bent 2x10 reps Hip Abd 2 x 10 reps,   Hip Circles 2x10 Hip ext  x 10 reps with Cuing for tight core and increased speed with all exercises more maximal strengthening and aerobic capacity benefit. Heel raises 2x10 reps Double yellow water dumbbell press down along sides Abd/add double yellow dumbbells GHJ ER/IR water resist with back against wall  -Pt requires buoyancy for support and to offload joints with strengthening exercises. Viscosity of the water is needed for resistance of strengthening; water current perturbations provides challenge to standing balance unsupported, requiring increased core activation.     PT Education - 08/12/20 0001     Education Details Instructed pt in correct form and positioning of exercises.              PT Short Term Goals - 07/23/20 1248       PT SHORT TERM GOAL #1   Title Pt will be independent with initial HEP    Status Achieved      PT SHORT TERM GOAL #2   Title Pt will be ambulating 2 minutes at least 3x in the day for home walking program    Status Achieved      PT SHORT TERM GOAL #3   Title Pt will demo improved double leg press strength with increased 1 RM to 220 lbs    Status Unable to assess               PT Long Term Goals - 07/30/20 1137       PT LONG TERM GOAL #1   Title Pt will be independent with continuing to self progress her exercises at home    Baseline requires further progression, being more mobile    Status On-going      PT LONG TERM GOAL #2   Title Pt will improve 2 MWT to at least 300' to demo improved walking endurance; revised 6MWT to improve by [facility]     Baseline 246' on 06/25/20, 690 on 7/20 for 6MWT, MDC 198.98, normative value for her age not available- value for 55 yo is 19'    Status Revised      PT LONG TERM GOAL #3   Title Pt will demo 5x STS of <13 sec to demo improved functional LE strength  Baseline 15s    Status On-going                   Plan - 08/12/20 0010     Clinical Impression Statement Pt reports improved function including ambulation, standing duration, and showering.  She is progressing with tolerance to exercises and functional tolerance as evidenced by performance of exercises and subjective reports.  Pt is able to perform increased exercises with reduced rest break.  Care was taken to keep blood glucose monitor close to pt and not wet.  Pt responded well to Rx having no c/o's and no pain after Rx.    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, CHF, diabetes mellitus, HTN, neuropathy, obesity    Examination-Activity Limitations [place]    PT Treatment/Interventions ADLs/Self Care Home Management;DME Instruction;Gait training;Stair training;Functional mobility training;Therapeutic activities;Balance training;Therapeutic exercise;Neuromuscular re-education;Patient/family education;Energy conservation    PT Next Visit Plan Cont with aquatic therapy    PT Home Exercise Plan [email or web address]    Consulted and Agree with Plan of Care Patient             Patient will benefit from skilled therapeutic intervention in order to improve the following deficits and impairments:  Abnormal gait, Cardiopulmonary status limiting activity, Decreased activity tolerance, Decreased balance, Decreased coordination, Decreased endurance, Decreased mobility, Difficulty walking, Decreased strength, Impaired sensation, Postural dysfunction, Obesity, Impaired UE functional use, Decreased range of motion, Pain  Visit Diagnosis: Muscle weakness (generalized)  Difficulty in walking, not elsewhere classified  Unsteadiness on  feet  Other abnormalities of gait and mobility     Problem List Patient Active Problem List   Diagnosis Date Noted   Long COVID 01/02/2020   Mixed stress and urge urinary incontinence 07/04/2019   Nerve pain 05/16/2019   Myofascial pain 05/16/2019   Subluxation of acromioclavicular joint, right, sequela 03/23/2019   Sickle cell trait ([place])    Labile blood glucose    Diabetic peripheral neuropathy (HCC)    Hypokalemia    Pain in joint of left shoulder    Debility    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   COVID-19    History of anemia    Super obese    Poorly controlled type 2 diabetes mellitus with peripheral neuropathy ([place])    Acute on chronic diastolic (congestive) heart failure (HCC)    Hypernatremia    Shock circulatory ([place])    Septic shock ([place])    Hypotension 01/13/2019   Acute respiratory failure with hypoxemia ([place]) 01/13/2019   Acute respiratory distress syndrome (ARDS) due to COVID-19 virus ([place]) 01/12/2019   Grade I diastolic dysfunction 123XX123   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   Dyspnea on exertion 05/19/2016   Other fatigue 05/19/2016   Absolute anemia 04/15/2016   Left leg weakness 10/18/2014   Left leg paresthesias 10/18/2014   Acute left-sided weakness 10/18/2014   Encounter for central line placement 03/08/2014   Taking drug for chronic disease 03/08/2014   H/O insertion of insulin pump 02/18/2014   Long term current use of insulin ([place]) 02/18/2014   Insulin pump in place 02/18/2014   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   Abnormal ballistocardiogram 05/18/2013   Abnormal nuclear stress test 05/18/2013   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   NICM (nonischemic cardiomyopathy) ([place]) 05/01/2013   Insulin dependent type 2 diabetes mellitus ([place]) 04/10/2013   Sex counseling 04/10/2013   Edema leg 04/10/2013   Upper respiratory infection, viral 04/10/2013   Pedal edema 03/29/2013   Pneumonia 12/12/2012   Essential hypertension 12/12/2012  HLD (hyperlipidemia) 12/12/2012   Tonsillar mass 12/01/2012   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 [place], [place], [address] Phone: [phone]   Fax:  [phone]  Name: Kaitlin [name] MRN: [id number] Date of Birth: Aug 24, 1965

## 2020-08-13 ENCOUNTER — Encounter (HOSPITAL_BASED_OUTPATIENT_CLINIC_OR_DEPARTMENT_OTHER): Payer: Self-pay | Admitting: Physical Therapy

## 2020-08-13 ENCOUNTER — Ambulatory Visit (HOSPITAL_BASED_OUTPATIENT_CLINIC_OR_DEPARTMENT_OTHER): Payer: Medicare Other | Admitting: Physical Therapy

## 2020-08-13 ENCOUNTER — Other Ambulatory Visit: Payer: Self-pay

## 2020-08-13 DIAGNOSIS — R262 Difficulty in walking, not elsewhere classified: Secondary | ICD-10-CM

## 2020-08-13 DIAGNOSIS — R2689 Other abnormalities of gait and mobility: Secondary | ICD-10-CM

## 2020-08-13 DIAGNOSIS — R2681 Unsteadiness on feet: Secondary | ICD-10-CM

## 2020-08-13 DIAGNOSIS — M6281 Muscle weakness (generalized): Secondary | ICD-10-CM | POA: Diagnosis not present

## 2020-08-13 NOTE — Therapy (Signed)
[facility] [address] [place], [place], [address] Phone: [phone]   Fax:  [phone]  Physical Therapy Treatment  Patient Details  Name: Kaitlin [name] MRN: [id number] Date of Birth: 1965-01-30 Referring Provider (PT): Courtney Heys, MD   Encounter Date: 08/13/2020   PT End of Session - 08/13/20 1155     Visit Number 13    Number of Visits 28    Date for PT Re-Evaluation 09/12/20    Authorization Type UHC Medicare    Progress Note Due on Visit 18    PT Start Time 1105    PT Stop Time 1147    PT Time Calculation (min) 42 min    Equipment Utilized During Treatment Other (comment)   ankle floats, yellow double DBs   Activity Tolerance Patient tolerated treatment well    Behavior During Therapy [facility] for tasks assessed/performed             Past Medical History:  Diagnosis Date   Anemia    Bronchitis    Cataracts, bilateral 01/12/2018   CHF (congestive heart failure) ([place])    Diabetes mellitus    High cholesterol    Hypertension    takes medicine to protect kidneys, does not have HTN   Leukocytosis    Morbid obesity ([place]) 09/11/2011   BMI 57   Neuropathy 01/12/2018   NICM (nonischemic cardiomyopathy) ([place]) 05/01/2013   Overview:  Last Assessment & Plan:  euvolemic on physical exam.    Obesity    Sickle cell trait ([place])    Thymoma     Past Surgical History:  Procedure Laterality Date   COLONOSCOPY N/A 06/21/2017   Procedure: COLONOSCOPY;  Surgeon: Ronnette Juniper, MD;  [facility];  Service: Gastroenterology;  Laterality: N/A;   LEFT HEART CATHETERIZATION WITH CORONARY ANGIOGRAM N/A 05/25/2013   Procedure: LEFT HEART CATHETERIZATION WITH CORONARY ANGIOGRAM;  Surgeon: Troy Sine, MD;  Location: [facility] CATH LAB;  Service: Cardiovascular;  Laterality: N/A;   LYMPH NODE BIOPSY Right 11/30/2012   Procedure: RIGHT TONSIL BIOPSY WITH FRESH FROZEN ANALYSIS;  Surgeon: Jodi Marble, MD;  Location: [facility];   Service: ENT;  Laterality: Right;   MEDIASTERNOTOMY N/A 01/12/2013   Procedure: MEDIAN STERNOTOMY;  Surgeon: Gaye Pollack, MD;  Location: [facility];  Service: Thoracic;  Laterality: N/A;   MEDIASTINOTOMY CHAMBERLAIN MCNEIL    Right 11/06/2012   Procedure: MEDIASTINOTOMY CHAMBERLAIN MCNEIL PROCEDURE;  Surgeon: Gaye Pollack, MD;  Location: [facility];  Service: Thoracic;  Laterality: Right;   POLYPECTOMY  06/21/2017   Procedure: POLYPECTOMY;  Surgeon: Ronnette Juniper, MD;  Location: [facility];  Service: Gastroenterology;;   RESECTION OF A THYMOMA N/A 01/12/2013   Procedure: RESECTION OF A THYMOMA;  Surgeon: Gaye Pollack, MD;  [facility];  Service: Thoracic;  Laterality: N/A;   TONSILLECTOMY     TUBAL LIGATION      There were no vitals filed for this visit.   Subjective Assessment - 08/13/20 1111     Subjective Pt has a new monitor for measuring blood glucose and is unable to get the receiver wet or be more than 20 feet away from the receiver.  Pt was informed it is waterproof and she can get in the pool.  She also states the pamphlets indicate that it's waterproof.   Pt denies any adverse effects after prior Rx.  Pt states she had her normal amount of fatigue.  Pt reports improved stanidng tolerance including standing in the  shower though had more difficulty standing in the shower this AM.  Pt walked from the parking garage into the facility again without stopping though had more difficulty today. She states "everything seems harder today for some reason".  Pt states she started having calf and ankle pain walking into facility today.    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  Blood Glucose monitor--needs to be w/n 20 feet of receiver    Currently in Pain? Yes    Pain Score 5     Pain Location --   upper back and R knee   Multiple Pain Sites Yes    Pain Score 6    Pain Location --   R ankle and calf            Pt seen  for aquatic therapy today.  Treatment took place in water 3.5-4.5 ft in depth at the [facility]. Temp of water was 91.  Pt entered/exited the pool via stairs step to pattern with bilat rail.     -Warm up: forward walking, backward walking, and side stepping x 2 laps each without UE assist   -Seated: Knee flex and extension with ankle floats 2 x 10 reps Bicycles  3 x 1 min  Sit <> stand arms fwd 3/10 -Standing Kick board push/pull 2 x 10 reps with cuing for tight core Lateral rotations with kick board for strengthening of core rotators x 10 reps each direction.  Marching 2x10 reps Hip Abd 2 x 10 reps Hip ext  x 10 reps with Cuing for tight core . Heel raises 2x10 reps Double yellow water dumbbell press down along sides Abd/add double yellow dumbbells GHJ ER/IR water resist with back against wall   -Pt requires buoyancy for support and to offload joints with strengthening exercises. Viscosity of the water is needed for resistance of strengthening; water current perturbations provides challenge to standing balance unsupported, requiring increased core activation.         PT Education - 08/13/20 2119     Education Details Instructed pt in correct form and appropriate depth of exercises.    Person(s) Educated Patient    Methods Explanation;Demonstration;Verbal cues    Comprehension Verbalized understanding;Returned demonstration              PT Short Term Goals - 07/23/20 1248       PT SHORT TERM GOAL #1   Title Pt will be independent with initial HEP    Status Achieved      PT SHORT TERM GOAL #2   Title Pt will be ambulating 2 minutes at least 3x in the day for home walking program    Status Achieved      PT SHORT TERM GOAL #3   Title Pt will demo improved double leg press strength with increased 1 RM to 220 lbs    Status Unable to assess               PT Long Term Goals - 07/30/20 1137       PT LONG TERM GOAL #1   Title Pt will be  independent with continuing to self progress her exercises at home    Baseline requires further progression, being more mobile    Status On-going      PT LONG TERM GOAL #2   Title Pt will improve 2 MWT to at least 300' to demo improved walking endurance; revised 6MWT to improve by  [place]    Baseline 246' on 06/25/20, 690 on 7/20 for 6MWT, MDC 198.98, normative value for her age not available- value for 55 yo is 4'    Status Revised      PT LONG TERM GOAL #3   Title Pt will demo 5x STS of <13 sec to demo improved functional LE strength    Baseline 15s    Status On-going                   Plan - 08/13/20 1156     Clinical Impression Statement Pt states she is more tired today including having more difficulty standing with good posture in the shower this AM.  Care was taken to move blood glucose receiver to keep it close to pt, but also to not get it or the bag it's in wet.  Pt performed exercises well with cuing and instruction on correct form and positioning.  Pt's tolerance to exercises was less today compared to prior Rx having to take multiple rest breaks t/o Rx.  She reports having ankle and calf pain walking in today which she states she has had before.  She reported improved calf pain though still present and improved ankle pain to none after Rx.  Pt reports no increase in knee pain after Rx.  Pt should benefit from cont skilled PT services/aquatic therapy to improve tolerance to activity, functional strength, mobility, and to address ongoing goals.    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, CHF, diabetes mellitus, HTN, neuropathy, obesity    PT Treatment/Interventions ADLs/Self Care Home Management;DME Instruction;Gait training;Stair training;Functional mobility training;Therapeutic activities;Balance training;Therapeutic exercise;Neuromuscular re-education;Patient/family education;Energy conservation    PT Next Visit Plan Cont with aquatic therapy    PT Home  Exercise Plan [id number]    Consulted and Agree with Plan of Care Patient             Patient will benefit from skilled therapeutic intervention in order to improve the following deficits and impairments:  Abnormal gait, Cardiopulmonary status limiting activity, Decreased activity tolerance, Decreased balance, Decreased coordination, Decreased endurance, Decreased mobility, Difficulty walking, Decreased strength, Impaired sensation, Postural dysfunction, Obesity, Impaired UE functional use, Decreased range of motion, Pain  Visit Diagnosis: Muscle weakness (generalized)  Difficulty in walking, not elsewhere classified  Unsteadiness on feet  Other abnormalities of gait and mobility     Problem List Patient Active Problem List   Diagnosis Date Noted   Long COVID 01/02/2020   Mixed stress and urge urinary incontinence 07/04/2019   Nerve pain 05/16/2019   Myofascial pain 05/16/2019   Subluxation of acromioclavicular joint, right, sequela 03/23/2019   Sickle cell trait ([place])    Labile blood glucose    Diabetic peripheral neuropathy (HCC)    Hypokalemia    Pain in joint of left shoulder    Debility    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   COVID-19    History of anemia    Super obese    Poorly controlled type 2 diabetes mellitus with peripheral neuropathy ([place])    Acute on chronic diastolic (congestive) heart failure ([place])    Hypernatremia    Shock circulatory ([place])    Septic shock ([place])    Hypotension 01/13/2019   Acute respiratory failure with hypoxemia ([place]) 01/13/2019   Acute respiratory distress syndrome (ARDS) due to COVID-19 virus ([place]) 01/12/2019   Grade I diastolic dysfunction 123XX123   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   Dyspnea on exertion 05/19/2016   Other fatigue 05/19/2016  Absolute anemia 04/15/2016   Left leg weakness 10/18/2014   Left leg paresthesias 10/18/2014   Acute left-sided weakness 10/18/2014   Encounter for central line placement  03/08/2014   Taking drug for chronic disease 03/08/2014   H/O insertion of insulin pump 02/18/2014   Long term current use of insulin ([place]) 02/18/2014   Insulin pump in place 02/18/2014   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   Abnormal ballistocardiogram 05/18/2013   Abnormal nuclear stress test 05/18/2013   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   NICM (nonischemic cardiomyopathy) ([place]) 05/01/2013   Insulin dependent type 2 diabetes mellitus ([place]) 04/10/2013   Sex counseling 04/10/2013   Edema leg 04/10/2013   Upper respiratory infection, viral 04/10/2013   Pedal edema 03/29/2013   Pneumonia 12/12/2012   Essential hypertension 12/12/2012   HLD (hyperlipidemia) 12/12/2012   Tonsillar mass 12/01/2012   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

## 2020-08-15 ENCOUNTER — Ambulatory Visit (HOSPITAL_BASED_OUTPATIENT_CLINIC_OR_DEPARTMENT_OTHER): Payer: Medicare Other | Admitting: Physical Therapy

## 2020-08-15 ENCOUNTER — Other Ambulatory Visit: Payer: Self-pay

## 2020-08-15 ENCOUNTER — Encounter (HOSPITAL_BASED_OUTPATIENT_CLINIC_OR_DEPARTMENT_OTHER): Payer: Self-pay | Admitting: Physical Therapy

## 2020-08-15 DIAGNOSIS — M6281 Muscle weakness (generalized): Secondary | ICD-10-CM

## 2020-08-15 DIAGNOSIS — R2689 Other abnormalities of gait and mobility: Secondary | ICD-10-CM

## 2020-08-15 DIAGNOSIS — R2681 Unsteadiness on feet: Secondary | ICD-10-CM

## 2020-08-15 DIAGNOSIS — R262 Difficulty in walking, not elsewhere classified: Secondary | ICD-10-CM

## 2020-08-15 NOTE — Therapy (Signed)
[facility] [address] [place], [place], [address] Phone: [phone]   Fax:  [phone]  Physical Therapy Treatment  Patient Details  Name: Kaitlin [name] MRN: [id number] Date of Birth: 10-[name]-1966 Referring Provider (PT): Courtney Heys, MD   Encounter Date: 08/15/2020   PT End of Session - 08/15/20 1134     Visit Number 14    Number of Visits 28    Date for PT Re-Evaluation 09/12/20    Authorization Type UHC Medicare    Progress Note Due on Visit 18    PT Start Time 1020    PT Stop Time 1108    PT Time Calculation (min) 48 min    Equipment Utilized During Treatment Other (comment)   ankle floats, kickboard   Activity Tolerance Patient tolerated treatment well    Behavior During Therapy [facility] for tasks assessed/performed             Past Medical History:  Diagnosis Date   Anemia    Bronchitis    Cataracts, bilateral 01/12/2018   CHF (congestive heart failure) ([place])    Diabetes mellitus    High cholesterol    Hypertension    takes medicine to protect kidneys, does not have HTN   Leukocytosis    Morbid obesity ([place]) 09/11/2011   BMI 57   Neuropathy 01/12/2018   NICM (nonischemic cardiomyopathy) ([place]) 05/01/2013   Overview:  Last Assessment & Plan:  euvolemic on physical exam.    Obesity    Sickle cell trait ([place])    Thymoma     Past Surgical History:  Procedure Laterality Date   COLONOSCOPY N/A 06/21/2017   Procedure: COLONOSCOPY;  Surgeon: Ronnette Juniper, MD;  Location: [facility];  Service: Gastroenterology;  Laterality: N/A;   LEFT HEART CATHETERIZATION WITH CORONARY ANGIOGRAM N/A 05/25/2013   Procedure: LEFT HEART CATHETERIZATION WITH CORONARY ANGIOGRAM;  Surgeon: Troy Sine, MD;  Location: [facility] CATH LAB;  Service: Cardiovascular;  Laterality: N/A;   LYMPH NODE BIOPSY Right 11/30/2012   Procedure: RIGHT TONSIL BIOPSY WITH FRESH FROZEN ANALYSIS;  Surgeon: Jodi Marble, MD;  Location: [place];  Service:  ENT;  Laterality: Right;   MEDIASTERNOTOMY N/A 01/12/2013   Procedure: MEDIAN STERNOTOMY;  Surgeon: Gaye Pollack, MD;  Location: [place];  Service: Thoracic;  Laterality: N/A;   MEDIASTINOTOMY CHAMBERLAIN MCNEIL    Right 11/06/2012   Procedure: MEDIASTINOTOMY CHAMBERLAIN MCNEIL PROCEDURE;  Surgeon: Gaye Pollack, MD;  Location: [facility];  Service: Thoracic;  Laterality: Right;   POLYPECTOMY  06/21/2017   Procedure: POLYPECTOMY;  Surgeon: Ronnette Juniper, MD;  Location: [facility];  Service: Gastroenterology;;   RESECTION OF A THYMOMA N/A 01/12/2013   Procedure: RESECTION OF A THYMOMA;  Surgeon: Gaye Pollack, MD;  [facility]: [facility] OR;  Service: Thoracic;  Laterality: N/A;   TONSILLECTOMY     TUBAL LIGATION      There were no vitals filed for this visit.   Subjective Assessment - 08/15/20 1021     Subjective Pt has a new monitor for measuring blood glucose and is unable to get the receiver wet or be more than 20 feet away from the receiver.  Pt was informed it is waterproof and she can get in the pool.  Pt has a waterproof lanyard for her receiver today.  Pt reports improved standing tolerance including standing in the shower.  Pt states she felt very tired after prior Rx and hurt all over except her calf.  Pt didn't have  to walk as far today due to being dropped off at the entrance.    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  Blood Glucose monitor--needs to be w/n 20 feet of receiver    Currently in Pain? Yes    Pain Score --   4.5/10   Pain Location Knee    Pain Orientation Right    Pain Score 4    Pain Location Thoracic   upper/mid thoracic and bilat shoulders             Pt seen for aquatic therapy today.  Treatment took place in water 3.5-4.5 ft in depth at the [facility]. Temp of water was 92.  Pt entered/exited the pool via stairs step to pattern with bilat rail. Reviewed response to prior Rx, pain  level, and current sx's.      -Warm up: forward walking x 3 laps, backward walking x 2 laps, and side stepping x 4 laps without UE assist   -Seated: Knee flex and extension with ankle floats 2 x 10 reps Bicycles  3 x 1 min  Sit <> stand arms fwd 2/10 -Standing Kick board push/pull 2 x 10 reps with cuing for tight core Lateral rotations with kick board for strengthening of core rotators x 10 reps each direction.  Marching 2x10 reps Hip Circles 2x10 each Hip Abd 2 x 10 reps Heel raises 2x10 reps Standing calf stretch at wall 2x20-30 seconds bilat   -Pt requires buoyancy for support and to offload joints with strengthening exercises. Viscosity of the water is needed for resistance of strengthening; water current perturbations provides challenge to standing balance unsupported, requiring increased core activation.       PT Education - 08/15/20 1241     Education Details Instructed pt in correct form and appropriate depth of exercises    Person(s) Educated Patient    Methods Explanation;Demonstration;Verbal cues    Comprehension Verbalized understanding;Returned demonstration              PT Short Term Goals - 07/23/20 1248       PT SHORT TERM GOAL #1   Title Pt will be independent with initial HEP    Status Achieved      PT SHORT TERM GOAL #2   Title Pt will be ambulating 2 minutes at least 3x in the day for home walking program    Status Achieved      PT SHORT TERM GOAL #3   Title Pt will demo improved double leg press strength with increased 1 RM to 220 lbs    Status Unable to assess               PT Long Term Goals - 07/30/20 1137       PT LONG TERM GOAL #1   Title Pt will be independent with continuing to self progress her exercises at home    Baseline requires further progression, being more mobile    Status On-going      PT LONG TERM GOAL #2   Title Pt will improve 2 MWT to at least 300' to demo improved walking endurance; revised 6MWT to improve  by [facility]    Baseline 246' on 06/25/20, 690 on 7/20 for 6MWT, MDC 198.98, normative value for her age not available- value for 55 yo is 74'    Status Revised      PT LONG TERM GOAL #3   Title Pt will demo  5x STS of <13 sec to demo improved functional LE strength    Baseline 15s    Status On-going                   Plan - 08/15/20 1135     Clinical Impression Statement Pt was very tired after prior Rx though feels better today.  Pt performed exercises well with instruction and cuing for correct form.  She had calf pain with amb bwd and  some during Rx.  Pt states she could feel her calf a little after Rx and reports improved knee pain after Rx.  Pt responded well to Rx and should cont to benefit from cont skilled PT services to improve tolerance to activity, address ongoing goals, and to restore desired level of function.    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, CHF, diabetes mellitus, HTN, neuropathy, obesity    PT Treatment/Interventions ADLs/Self Care Home Management;DME Instruction;Gait training;Stair training;Functional mobility training;Therapeutic activities;Balance training;Therapeutic exercise;Neuromuscular re-education;Patient/family education;Energy conservation    PT Next Visit Plan Cont with aquatic therapy    PT Home Exercise Plan [email or web address]    Consulted and Agree with Plan of Care Patient             Patient will benefit from skilled therapeutic intervention in order to improve the following deficits and impairments:  Abnormal gait, Cardiopulmonary status limiting activity, Decreased activity tolerance, Decreased balance, Decreased coordination, Decreased endurance, Decreased mobility, Difficulty walking, Decreased strength, Impaired sensation, Postural dysfunction, Obesity, Impaired UE functional use, Decreased range of motion, Pain  Visit Diagnosis: Muscle weakness (generalized)  Difficulty in walking, not elsewhere classified  Unsteadiness on  feet  Other abnormalities of gait and mobility     Problem List Patient Active Problem List   Diagnosis Date Noted   Long COVID 01/02/2020   Mixed stress and urge urinary incontinence 07/04/2019   Nerve pain 05/16/2019   Myofascial pain 05/16/2019   Subluxation of acromioclavicular joint, right, sequela 03/23/2019   Sickle cell trait ([place])    Labile blood glucose    Diabetic peripheral neuropathy (HCC)    Hypokalemia    Pain in joint of left shoulder    Debility    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   COVID-19    History of anemia    Super obese    Poorly controlled type 2 diabetes mellitus with peripheral neuropathy ([place])    Acute on chronic diastolic (congestive) heart failure (HCC)    Hypernatremia    Shock circulatory ([place])    Septic shock ([place])    Hypotension 01/13/2019   Acute respiratory failure with hypoxemia ([place]) 01/13/2019   Acute respiratory distress syndrome (ARDS) due to COVID-19 virus ([place]) 01/12/2019   Grade I diastolic dysfunction [id number]   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   Dyspnea on exertion 05/19/2016   Other fatigue 05/19/2016   Absolute anemia 04/15/2016   Left leg weakness 10/18/2014   Left leg paresthesias 10/18/2014   Acute left-sided weakness 10/18/2014   Encounter for central line placement 03/08/2014   Taking drug for chronic disease 03/08/2014   H/O insertion of insulin pump 02/18/2014   Long term current use of insulin ([place]) 02/18/2014   Insulin pump in place 02/18/2014   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   Abnormal ballistocardiogram 05/18/2013   Abnormal nuclear stress test 05/18/2013   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   NICM (nonischemic cardiomyopathy) ([place]) 05/01/2013   Insulin dependent type 2 diabetes mellitus ([place]) 04/10/2013   Sex counseling 04/10/2013   Edema  leg 04/10/2013   Upper respiratory infection, viral 04/10/2013   Pedal edema 03/29/2013   Pneumonia 12/12/2012   Essential hypertension 12/12/2012    HLD (hyperlipidemia) 12/12/2012   Tonsillar mass 12/01/2012   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 [place], [place], [address] Phone: [phone]   Fax:  [phone]  Name: Kaitlin [name] MRN: [id number] Date of Birth: 02/05/1965

## 2020-08-18 ENCOUNTER — Encounter (HOSPITAL_BASED_OUTPATIENT_CLINIC_OR_DEPARTMENT_OTHER): Payer: Self-pay | Admitting: Physical Therapy

## 2020-08-18 ENCOUNTER — Other Ambulatory Visit: Payer: Self-pay

## 2020-08-18 ENCOUNTER — Ambulatory Visit (HOSPITAL_BASED_OUTPATIENT_CLINIC_OR_DEPARTMENT_OTHER): Payer: Medicare Other | Admitting: Physical Therapy

## 2020-08-18 DIAGNOSIS — R2689 Other abnormalities of gait and mobility: Secondary | ICD-10-CM

## 2020-08-18 DIAGNOSIS — M6281 Muscle weakness (generalized): Secondary | ICD-10-CM | POA: Diagnosis not present

## 2020-08-18 DIAGNOSIS — G7281 Critical illness myopathy: Secondary | ICD-10-CM

## 2020-08-18 DIAGNOSIS — R262 Difficulty in walking, not elsewhere classified: Secondary | ICD-10-CM

## 2020-08-18 DIAGNOSIS — R2681 Unsteadiness on feet: Secondary | ICD-10-CM

## 2020-08-18 DIAGNOSIS — R41840 Attention and concentration deficit: Secondary | ICD-10-CM

## 2020-08-18 NOTE — Therapy (Signed)
[facility] [address] [place], [place], [address] Phone: [phone]   Fax:  [phone]  Physical Therapy Treatment  Patient Details  Name: Kaitlin [name] MRN: [id number] Date of Birth: 1965-02-09 Referring Provider (PT): Courtney Heys, MD   Encounter Date: 08/18/2020   PT End of Session - 08/18/20 0956     Visit Number 15    Number of Visits 28    Date for PT Re-Evaluation 09/12/20    Authorization Type UHC Medicare    PT Start Time [phone]    PT Stop Time 1032    PT Time Calculation (min) 44 min    Equipment Utilized During Treatment Other (comment)    Activity Tolerance Patient tolerated treatment well    Behavior During Therapy [facility] for tasks assessed/performed             Past Medical History:  Diagnosis Date   Anemia    Bronchitis    Cataracts, bilateral 01/12/2018   CHF (congestive heart failure) ([place])    Diabetes mellitus    High cholesterol    Hypertension    takes medicine to protect kidneys, does not have HTN   Leukocytosis    Morbid obesity ([place]) 09/11/2011   BMI 57   Neuropathy 01/12/2018   NICM (nonischemic cardiomyopathy) ([place]) 05/01/2013   Overview:  Last Assessment & Plan:  euvolemic on physical exam.    Obesity    Sickle cell trait ([place])    Thymoma     Past Surgical History:  Procedure Laterality Date   COLONOSCOPY N/A 06/21/2017   Procedure: COLONOSCOPY;  Surgeon: Ronnette Juniper, MD;  [facility]: [facility];  Service: Gastroenterology;  Laterality: N/A;   LEFT HEART CATHETERIZATION WITH CORONARY ANGIOGRAM N/A 05/25/2013   Procedure: LEFT HEART CATHETERIZATION WITH CORONARY ANGIOGRAM;  Surgeon: Troy Sine, MD;  Location: [facility] CATH LAB;  Service: Cardiovascular;  Laterality: N/A;   LYMPH NODE BIOPSY Right 11/30/2012   Procedure: RIGHT TONSIL BIOPSY WITH FRESH FROZEN ANALYSIS;  Surgeon: Jodi Marble, MD;  Location: [facility];  Service: ENT;  Laterality: Right;   MEDIASTERNOTOMY N/A 01/12/2013    Procedure: MEDIAN STERNOTOMY;  Surgeon: Gaye Pollack, MD;  Location: [place];  Service: Thoracic;  Laterality: N/A;   MEDIASTINOTOMY CHAMBERLAIN MCNEIL    Right 11/06/2012   Procedure: MEDIASTINOTOMY CHAMBERLAIN MCNEIL PROCEDURE;  Surgeon: Gaye Pollack, MD;  Location: [facility];  Service: Thoracic;  Laterality: Right;   POLYPECTOMY  06/21/2017   Procedure: POLYPECTOMY;  Surgeon: Ronnette Juniper, MD;  Location: [facility];  Service: Gastroenterology;;   RESECTION OF A THYMOMA N/A 01/12/2013   Procedure: RESECTION OF A THYMOMA;  Surgeon: Gaye Pollack, MD;  Location: [facility];  Service: Thoracic;  Laterality: N/A;   TONSILLECTOMY     TUBAL LIGATION      There were no vitals filed for this visit.   Subjective Assessment - 08/18/20 1242     Subjective Blood sugar 149 today.  Wearing lanyard with monitor. No complaints.    Currently in Pain? Yes    Pain Score 2     Pain Descriptors / Indicators Aching    Pain Type Chronic pain    Pain Onset More than a month ago    Pain Frequency Intermittent    Pain Score 3    Pain Location Thoracic    Pain Orientation Mid    Pain Descriptors / Indicators Aching    Pain Type Chronic pain    Pain Onset More than  a month ago    Pain Frequency Intermittent    Aggravating Factors  walking standing    Pain Relieving Factors resting    Effect of Pain on Daily Activities difficulty with daily chores                                       PT Education - 08/18/20 1034     Education Details Effects of exercise on blood sugar    Person(s) Educated Patient    Methods Explanation    Comprehension Verbalized understanding              PT Short Term Goals - 07/23/20 1248       PT SHORT TERM GOAL #1   Title Pt will be independent with initial HEP    Status Achieved      PT SHORT TERM GOAL #2   Title Pt will be ambulating 2 minutes at least 3x in the day for home walking program    Status Achieved      PT SHORT TERM GOAL #3    Title Pt will demo improved double leg press strength with increased 1 RM to 220 lbs    Status Unable to assess               PT Long Term Goals - 07/30/20 1137       PT LONG TERM GOAL #1   Title Pt will be independent with continuing to self progress her exercises at home    Baseline requires further progression, being more mobile    Status On-going      PT LONG TERM GOAL #2   Title Pt will improve 2 MWT to at least 300' to demo improved walking endurance; revised 6MWT to improve by [facility]    Baseline 246' on 06/25/20, 690 on 7/20 for 6MWT, MDC 198.98, normative value for her age not available- value for 55 yo is 71'    Status Revised      PT LONG TERM GOAL #3   Title Pt will demo 5x STS of <13 sec to demo improved functional LE strength    Baseline 15s    Status On-going            Pt seen for aquatic therapy today.  Treatment took place in water 3.5-4.5 ft in depth at the [facility]. Temp of water was 92.  Pt entered/exited the pool via stairs step to pattern with bilat rail.     -Warm up: forward walking x 3 laps, backward walking x 2 laps, and side stepping x 4 laps without UE assist   -Seated: Stretching gastroc, hamstrings and Lb Knee flex and extension with ankle floats 2 x 10 reps Marching2 x 10  Sit <> stand arms fwd 2/10  Standing: using ankle buoys Kick board push/pull 2 x 10 reps with cuing for tight core Stretching hip flex, abd and quads 3 x 20 sec Hip/knee flex x 12 reps Marching 2x10 reps Hip Circles 2x10 each Hip Abd 2 x 10 rep  Multiple seated and standing rest periods throughout.  Pt entered [place] upon completion of session (unbilled) for LB massage.     Pt requires buoyancy for support and to offload joints with strengthening exercises. Viscosity of the water is needed for resistance of strengthening; water current perturbations provides challenge to standing balance unsupported, requiring increased core  activation.  Plan - 08/18/20 1245     Clinical Impression Statement Pt continues to need multiple rest periods throughout session but periods shorter as  reps and activitys are increasing.   Pt education/instruction throughout on effects of exercise on blood sugar.  Pt states she has had much fewer "highs" since she has begun consistent Physical Therapy (overal activity).    Personal Factors and Comorbidities Comorbidity 3+;Time since onset of injury/illness/exacerbation;Fitness;Past/Current Experience    Stability/Clinical Decision Making Evolving/Moderate complexity    Clinical Decision Making Moderate    Rehab Potential Good    PT Frequency 3x / week    PT Treatment/Interventions ADLs/Self Care Home Management;DME Instruction;Gait training;Stair training;Functional mobility training;Therapeutic activities;Balance training;Therapeutic exercise;Neuromuscular re-education;Patient/family education;Energy conservation    PT Home Exercise Plan [id number]             Patient will benefit from skilled therapeutic intervention in order to improve the following deficits and impairments:  Abnormal gait, Cardiopulmonary status limiting activity, Decreased activity tolerance, Decreased balance, Decreased coordination, Decreased endurance, Decreased mobility, Difficulty walking, Decreased strength, Impaired sensation, Postural dysfunction, Obesity, Impaired UE functional use, Decreased range of motion, Pain  Visit Diagnosis: Muscle weakness (generalized)  Critical illness myopathy  Unsteadiness on feet  Other abnormalities of gait and mobility  Attention and concentration deficit  Difficulty in walking, not elsewhere classified     Problem List Patient Active Problem List   Diagnosis Date Noted   Long COVID 01/02/2020   Mixed stress and urge urinary incontinence 07/04/2019   Nerve pain 05/16/2019   Myofascial pain 05/16/2019   Subluxation of acromioclavicular joint, right,  sequela 03/23/2019   Sickle cell trait ([place])    Labile blood glucose    Diabetic peripheral neuropathy (HCC)    Hypokalemia    Pain in joint of left shoulder    Debility    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   COVID-19    History of anemia    Super obese    Poorly controlled type 2 diabetes mellitus with peripheral neuropathy ([place])    Acute on chronic diastolic (congestive) heart failure (HCC)    Hypernatremia    Shock circulatory ([place])    Septic shock ([place])    Hypotension 01/13/2019   Acute respiratory failure with hypoxemia ([place]) 01/13/2019   Acute respiratory distress syndrome (ARDS) due to COVID-19 virus ([place]) 01/12/2019   Grade I diastolic dysfunction 123XX123   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   Dyspnea on exertion 05/19/2016   Other fatigue 05/19/2016   Absolute anemia 04/15/2016   Left leg weakness 10/18/2014   Left leg paresthesias 10/18/2014   Acute left-sided weakness 10/18/2014   Encounter for central line placement 03/08/2014   Taking drug for chronic disease 03/08/2014   H/O insertion of insulin pump 02/18/2014   Long term current use of insulin ([place]) 02/18/2014   Insulin pump in place 02/18/2014   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   Abnormal ballistocardiogram 05/18/2013   Abnormal nuclear stress test 05/18/2013   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   NICM (nonischemic cardiomyopathy) ([place]) 05/01/2013   Insulin dependent type 2 diabetes mellitus ([place]) 04/10/2013   Sex counseling 04/10/2013   Edema leg 04/10/2013   Upper respiratory infection, viral 04/10/2013   Pedal edema 03/29/2013   Pneumonia 12/12/2012   Essential hypertension 12/12/2012   HLD (hyperlipidemia) 12/12/2012   Tonsillar mass 12/01/2012   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

## 2020-08-20 ENCOUNTER — Other Ambulatory Visit: Payer: Self-pay

## 2020-08-20 ENCOUNTER — Ambulatory Visit (HOSPITAL_BASED_OUTPATIENT_CLINIC_OR_DEPARTMENT_OTHER): Payer: Medicare Other | Admitting: Physical Therapy

## 2020-08-20 ENCOUNTER — Encounter (HOSPITAL_BASED_OUTPATIENT_CLINIC_OR_DEPARTMENT_OTHER): Payer: Self-pay | Admitting: Physical Therapy

## 2020-08-20 DIAGNOSIS — M25612 Stiffness of left shoulder, not elsewhere classified: Secondary | ICD-10-CM

## 2020-08-20 DIAGNOSIS — R2689 Other abnormalities of gait and mobility: Secondary | ICD-10-CM

## 2020-08-20 DIAGNOSIS — R2681 Unsteadiness on feet: Secondary | ICD-10-CM

## 2020-08-20 DIAGNOSIS — M79601 Pain in right arm: Secondary | ICD-10-CM

## 2020-08-20 DIAGNOSIS — R262 Difficulty in walking, not elsewhere classified: Secondary | ICD-10-CM

## 2020-08-20 DIAGNOSIS — M6281 Muscle weakness (generalized): Secondary | ICD-10-CM

## 2020-08-20 DIAGNOSIS — R41840 Attention and concentration deficit: Secondary | ICD-10-CM

## 2020-08-20 DIAGNOSIS — G7281 Critical illness myopathy: Secondary | ICD-10-CM

## 2020-08-20 NOTE — Therapy (Signed)
[facility] [address] [place], [place], [address] Phone: [phone]   Fax:  [phone]  Physical Therapy Treatment  Patient Details  Name: Kaitlin [name] MRN: [id number] Date of Birth: 1965/09/10 Referring Provider (PT): Courtney Heys, MD   Encounter Date: 08/20/2020   PT End of Session - 08/20/20 1042     Visit Number 16    Number of Visits 28    Date for PT Re-Evaluation 09/12/20    PT Start Time 1030    PT Stop Time 1115    PT Time Calculation (min) 45 min    Equipment Utilized During Treatment Other (comment)    Activity Tolerance Patient tolerated treatment well    Behavior During Therapy [facility] for tasks assessed/performed             Past Medical History:  Diagnosis Date   Anemia    Bronchitis    Cataracts, bilateral 01/12/2018   CHF (congestive heart failure) ([place])    Diabetes mellitus    High cholesterol    Hypertension    takes medicine to protect kidneys, does not have HTN   Leukocytosis    Morbid obesity ([place]) 09/11/2011   BMI 57   Neuropathy 01/12/2018   NICM (nonischemic cardiomyopathy) ([place]) 05/01/2013   Overview:  Last Assessment & Plan:  euvolemic on physical exam.    Obesity    Sickle cell trait ([place])    Thymoma     Past Surgical History:  Procedure Laterality Date   COLONOSCOPY N/A 06/21/2017   Procedure: COLONOSCOPY;  Surgeon: Ronnette Juniper, MD;  Location: [facility];  Service: Gastroenterology;  Laterality: N/A;   LEFT HEART CATHETERIZATION WITH CORONARY ANGIOGRAM N/A 05/25/2013   Procedure: LEFT HEART CATHETERIZATION WITH CORONARY ANGIOGRAM;  Surgeon: Troy Sine, MD;  Location: [facility] CATH LAB;  Service: Cardiovascular;  Laterality: N/A;   LYMPH NODE BIOPSY Right 11/30/2012   Procedure: RIGHT TONSIL BIOPSY WITH FRESH FROZEN ANALYSIS;  Surgeon: Jodi Marble, MD;  Location: [place];  Service: ENT;  Laterality: Right;   MEDIASTERNOTOMY N/A 01/12/2013   Procedure: MEDIAN STERNOTOMY;   Surgeon: Gaye Pollack, MD;  Location: [place];  Service: Thoracic;  Laterality: N/A;   MEDIASTINOTOMY CHAMBERLAIN MCNEIL    Right 11/06/2012   Procedure: MEDIASTINOTOMY CHAMBERLAIN MCNEIL PROCEDURE;  Surgeon: Gaye Pollack, MD;  Location: [place];  Service: Thoracic;  Laterality: Right;   POLYPECTOMY  06/21/2017   Procedure: POLYPECTOMY;  Surgeon: Ronnette Juniper, MD;  Location: [facility];  Service: Gastroenterology;;   RESECTION OF A THYMOMA N/A 01/12/2013   Procedure: RESECTION OF A THYMOMA;  Surgeon: Gaye Pollack, MD;  Location: [facility];  Service: Thoracic;  Laterality: N/A;   TONSILLECTOMY     TUBAL LIGATION      There were no vitals filed for this visit.   Subjective Assessment - 08/20/20 1319     Subjective "I'm ready, a little tired"                                         PT Short Term Goals - 07/23/20 1248       PT SHORT TERM GOAL #1   Title Pt will be independent with initial HEP    Status Achieved      PT SHORT TERM GOAL #2   Title Pt will be ambulating 2 minutes at least 3x in the day  for home walking program    Status Achieved      PT SHORT TERM GOAL #3   Title Pt will demo improved double leg press strength with increased 1 RM to 220 lbs    Status Unable to assess               PT Long Term Goals - 07/30/20 1137       PT LONG TERM GOAL #1   Title Pt will be independent with continuing to self progress her exercises at home    Baseline requires further progression, being more mobile    Status On-going      PT LONG TERM GOAL #2   Title Pt will improve 2 MWT to at least 300' to demo improved walking endurance; revised 6MWT to improve by [facility]    Baseline 246' on 06/25/20, 690 on 7/20 for 6MWT, MDC 198.98, normative value for her age not available- value for 55 yo is 58'    Status Revised      PT LONG TERM GOAL #3   Title Pt will demo 5x STS of <13 sec to demo improved functional LE strength    Baseline 15s    Status On-going            Pt seen for aquatic therapy today.  Treatment took place in water 3.5-4.5 ft in depth at the [facility]. Temp of water was 92.  Pt entered/exited the pool via stairs step to pattern with bilat rail.     -Warm up: forward walking x 3 laps, backward walking x 2 laps, and side stepping x 4 laps without UE assist   -Seated: Stretching gastroc, hamstrings and Lb Knee flex and extension 2 x 10 reps vc for increased speed Marching 2 x 15  Stretching hip flex, abd and quads 3 x 20 sec Hip flex with knee flex and ext/kickbacks x 12  Progressed to bilat ue support on yellow noodle: Marching 2x12 reps Hip Circles x10 each. Pt requires toe touch down fter each rep for balance Hip Abd 2 x 12 rep Hip flex 2x10  Aerobic capacity: simulated running 8 continuous widths of pool in 4 ft  Balance challenges: decreasing BOS holding x 10 sec. Manual perturbations given. Successful with all attempts Tandem stance multiple trys to hold x 10 sec. Manual perturbations with LOB Ai Chi position 13 modified UE support on noodle.  Pt able to complete 3 reps bilaterally.  VC and TC for proper execution.  1 seated rest period and standing rest periods   Pt entered Jacuzzi upon completion of session (non-billed) for LB massage.     Pt requires buoyancy for support and to offload joints with strengthening exercises. Viscosity of the water is needed for resistance of strengthening; water current perturbations provides challenge to standing balance unsupported, requiring increased core activation.        Plan - 08/20/20 1251     Clinical Impression Statement Pt reports being at about 60-65% of PLOF since covid in [name] 2021.  Right knee has improved with aquatic therapy. Focus today on advancing balance challenges/core strengthening.  Pt with difficulty requiring vc and tc for proper technique and execution. Pt only requiring 1 seated rest periods throughout as evidence of iproving  endurance.    Stability/Clinical Decision Making Evolving/Moderate complexity    PT Treatment/Interventions ADLs/Self Care Home Management;DME Instruction;Gait training;Stair training;Functional mobility training;Therapeutic activities;Balance training;Therapeutic exercise;Neuromuscular re-education;Patient/family education;Energy conservation    PT Home Exercise Plan [id number]  Patient will benefit from skilled therapeutic intervention in order to improve the following deficits and impairments:  Abnormal gait, Cardiopulmonary status limiting activity, Decreased activity tolerance, Decreased balance, Decreased coordination, Decreased endurance, Decreased mobility, Difficulty walking, Decreased strength, Impaired sensation, Postural dysfunction, Obesity, Impaired UE functional use, Decreased range of motion, Pain  Visit Diagnosis: Muscle weakness (generalized)  Attention and concentration deficit  Difficulty in walking, not elsewhere classified  Critical illness myopathy  Stiffness of left shoulder, not elsewhere classified  Unsteadiness on feet  Other abnormalities of gait and mobility  Pain in right arm     Problem List Patient Active Problem List   Diagnosis Date Noted   Long COVID 01/02/2020   Mixed stress and urge urinary incontinence 07/04/2019   Nerve pain 05/16/2019   Myofascial pain 05/16/2019   Subluxation of acromioclavicular joint, right, sequela 03/23/2019   Sickle cell trait ([place])    Labile blood glucose    Diabetic peripheral neuropathy (HCC)    Hypokalemia    Pain in joint of left shoulder    Debility    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   COVID-19    History of anemia    Super obese    Poorly controlled type 2 diabetes mellitus with peripheral neuropathy (HCC)    Acute on chronic diastolic (congestive) heart failure (HCC)    Hypernatremia    Shock circulatory (HCC)    Septic shock (HCC)    Hypotension 01/13/2019   Acute  respiratory failure with hypoxemia ([place]) 01/13/2019   Acute respiratory distress syndrome (ARDS) due to COVID-19 virus ([place]) 01/12/2019   Grade I diastolic dysfunction [id number]   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   Dyspnea on exertion 05/19/2016   Other fatigue 05/19/2016   Absolute anemia 04/15/2016   Left leg weakness 10/18/2014   Left leg paresthesias 10/18/2014   Acute left-sided weakness 10/18/2014   Encounter for central line placement 03/08/2014   Taking drug for chronic disease 03/08/2014   H/O insertion of insulin pump 02/18/2014   Long term current use of insulin ([place]) 02/18/2014   Insulin pump in place 02/18/2014   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   Abnormal ballistocardiogram 05/18/2013   Abnormal nuclear stress test 05/18/2013   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   NICM (nonischemic cardiomyopathy) ([place]) 05/01/2013   Insulin dependent type 2 diabetes mellitus ([place]) 04/10/2013   Sex counseling 04/10/2013   Edema leg 04/10/2013   Upper respiratory infection, viral 04/10/2013   Pedal edema 03/29/2013   Pneumonia 12/12/2012   Essential hypertension 12/12/2012   HLD (hyperlipidemia) 12/12/2012   Tonsillar mass 12/01/2012   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 [place], [place], [address] Phone: [phone]   Fax:  [phone]  Name: Kaitlin [name] MRN: [id number] Date of Birth: 1965/06/17

## 2020-08-22 ENCOUNTER — Encounter
Payer: Medicare Other | Attending: Physical Medicine and Rehabilitation | Admitting: Physical Medicine and Rehabilitation

## 2020-08-22 ENCOUNTER — Other Ambulatory Visit: Payer: Self-pay

## 2020-08-22 ENCOUNTER — Encounter: Payer: Self-pay | Admitting: Physical Medicine and Rehabilitation

## 2020-08-22 ENCOUNTER — Ambulatory Visit (HOSPITAL_BASED_OUTPATIENT_CLINIC_OR_DEPARTMENT_OTHER): Payer: Medicare Other | Admitting: Physical Therapy

## 2020-08-22 ENCOUNTER — Encounter (HOSPITAL_BASED_OUTPATIENT_CLINIC_OR_DEPARTMENT_OTHER): Payer: Self-pay | Admitting: Physical Therapy

## 2020-08-22 VITALS — BP 110/74 | HR 99 | Temp 97.7°F | Ht 69.0 in | Wt 381.0 lb

## 2020-08-22 DIAGNOSIS — N3946 Mixed incontinence: Secondary | ICD-10-CM

## 2020-08-22 DIAGNOSIS — M79601 Pain in right arm: Secondary | ICD-10-CM

## 2020-08-22 DIAGNOSIS — M6281 Muscle weakness (generalized): Secondary | ICD-10-CM | POA: Diagnosis not present

## 2020-08-22 DIAGNOSIS — M79602 Pain in left arm: Secondary | ICD-10-CM

## 2020-08-22 DIAGNOSIS — R2689 Other abnormalities of gait and mobility: Secondary | ICD-10-CM

## 2020-08-22 DIAGNOSIS — M7918 Myalgia, other site: Secondary | ICD-10-CM | POA: Diagnosis present

## 2020-08-22 DIAGNOSIS — R41840 Attention and concentration deficit: Secondary | ICD-10-CM

## 2020-08-22 DIAGNOSIS — U099 Post covid-19 condition, unspecified: Secondary | ICD-10-CM

## 2020-08-22 DIAGNOSIS — M25612 Stiffness of left shoulder, not elsewhere classified: Secondary | ICD-10-CM

## 2020-08-22 DIAGNOSIS — R2681 Unsteadiness on feet: Secondary | ICD-10-CM

## 2020-08-22 DIAGNOSIS — G7281 Critical illness myopathy: Secondary | ICD-10-CM

## 2020-08-22 MED ORDER — METHOCARBAMOL 500 MG PO TABS: 500.0000 mg | ORAL_TABLET | Freq: Three times a day (TID) | ORAL | 5 refills | Status: DC | PRN

## 2020-08-22 NOTE — Progress Notes (Signed)
Subjective:    Patient ID: Kaitlin [name], female    DOB: 04-May-1965, 55 y.o.   MRN: [id number]  HPI  Patient is a 55 yr old female with DM, with ICU myopathy due to COVID here for  f/u. Still has severe difficulty with endurance due to ICU myopathy.    Here for f/u on Long COVID.   Doing pool therapy 3x/week - 2nd week of 3x/week- is tired, but working well.   A little bit of strength/endurance and ROM- more loose as well.- not so constricted- pain slightly better.   Still needing trigger point injections- don't think need as many.   Needs fewer incontinence supplies-  Not needing as much- 1/2 of what she's getting- doing better, not quite normal, but finally better than it was!   Pain Inventory Average Pain 5 Pain Right Now 4 My pain is burning, tingling, and aching  In the last 24 hours, has pain interfered with the following? General activity 4 Relation with others 0 Enjoyment of life 5 What TIME of day is your pain at its worst? evening Sleep (in general) Fair  Pain is worse with: walking, bending, sitting, standing, and some activites Pain improves with: rest, heat/ice, therapy/exercise, and medication Relief from Meds:  na  Family History  Problem Relation Age of Onset   Heart disease Father        No details   Diabetes type II Other        Family HX   Breast cancer Other    Breast cancer Maternal Grandmother    Social History   Socioeconomic History   Marital status: Widowed    Spouse name: Not on file   Number of children: 3   Years of education: 14   Highest education level: Not on file  Occupational History   Occupation: Unemployed  Tobacco Use   Smoking status: Passive Smoke Exposure - Never Smoker   Smokeless tobacco: Never  Vaping Use   Vaping Use: Never used  Substance and Sexual Activity   Alcohol use: No   Drug use: No   Sexual activity: Yes    Birth control/protection: Surgical  Other Topics Concern   Not on file  Social History  Narrative   Lives at home with mother, husband and son   Caffeine use: none   Social Determinants of Health   Financial Resource Strain: Not on file  Food Insecurity: Not on file  Transportation Needs: Not on file  Physical Activity: Not on file  Stress: Not on file  Social Connections: Not on file   Past Surgical History:  Procedure Laterality Date   COLONOSCOPY N/A 06/21/2017   Procedure: COLONOSCOPY;  Surgeon: Ronnette Juniper, MD;  Location: [facility];  Service: Gastroenterology;  Laterality: N/A;   LEFT HEART CATHETERIZATION WITH CORONARY ANGIOGRAM N/A 05/25/2013   Procedure: LEFT HEART CATHETERIZATION WITH CORONARY ANGIOGRAM;  Surgeon: Troy Sine, MD;  Location: [facility] CATH LAB;  Service: Cardiovascular;  Laterality: N/A;   LYMPH NODE BIOPSY Right 11/30/2012   Procedure: RIGHT TONSIL BIOPSY WITH FRESH FROZEN ANALYSIS;  Surgeon: Jodi Marble, MD;  Location: [place];  Service: ENT;  Laterality: Right;   MEDIASTERNOTOMY N/A 01/12/2013   Procedure: MEDIAN STERNOTOMY;  Surgeon: Gaye Pollack, MD;  Location: [place];  Service: Thoracic;  Laterality: N/A;   MEDIASTINOTOMY CHAMBERLAIN MCNEIL    Right 11/06/2012   Procedure: MEDIASTINOTOMY CHAMBERLAIN MCNEIL PROCEDURE;  Surgeon: Gaye Pollack, MD;  Location: [place];  Service: Thoracic;  Laterality: Right;   POLYPECTOMY  06/21/2017   Procedure: POLYPECTOMY;  Surgeon: Ronnette Juniper, MD;  Location: [facility];  Service: Gastroenterology;;   RESECTION OF A THYMOMA N/A 01/12/2013   Procedure: RESECTION OF A THYMOMA;  Surgeon: Gaye Pollack, MD;  Location: [place];  Service: Thoracic;  Laterality: N/A;   TONSILLECTOMY     TUBAL LIGATION     Past Surgical History:  Procedure Laterality Date   COLONOSCOPY N/A 06/21/2017   Procedure: COLONOSCOPY;  Surgeon: Ronnette Juniper, MD;  [facility]: [facility];  Service: Gastroenterology;  Laterality: N/A;   LEFT HEART CATHETERIZATION WITH CORONARY ANGIOGRAM N/A 05/25/2013   Procedure: LEFT HEART CATHETERIZATION WITH  CORONARY ANGIOGRAM;  Surgeon: Troy Sine, MD;  [facility] CATH LAB;  Service: Cardiovascular;  Laterality: N/A;   LYMPH NODE BIOPSY Right 11/30/2012   Procedure: RIGHT TONSIL BIOPSY WITH FRESH FROZEN ANALYSIS;  Surgeon: Jodi Marble, MD;  Location: [place];  Service: ENT;  Laterality: Right;   MEDIASTERNOTOMY N/A 01/12/2013   Procedure: MEDIAN STERNOTOMY;  Surgeon: Gaye Pollack, MD;  Location: [place] [facility];  Service: Thoracic;  Laterality: N/A;   MEDIASTINOTOMY CHAMBERLAIN MCNEIL    Right 11/06/2012   Procedure: MEDIASTINOTOMY CHAMBERLAIN MCNEIL PROCEDURE;  Surgeon: Gaye Pollack, MD;  Location: [place];  Service: Thoracic;  Laterality: Right;   POLYPECTOMY  06/21/2017   Procedure: POLYPECTOMY;  Surgeon: Ronnette Juniper, MD;  [facility];  Service: Gastroenterology;;   RESECTION OF A THYMOMA N/A 01/12/2013   Procedure: RESECTION OF A THYMOMA;  Surgeon: Gaye Pollack, MD;  Location: [place];  Service: Thoracic;  Laterality: N/A;   TONSILLECTOMY     TUBAL LIGATION     Past Medical History:  Diagnosis Date   Anemia    Bronchitis    Cataracts, bilateral 01/12/2018   CHF (congestive heart failure) ([place])    Diabetes mellitus    High cholesterol    Hypertension    takes medicine to protect kidneys, does not have HTN   Leukocytosis    Morbid obesity ([place]) 09/11/2011   BMI 57   Neuropathy 01/12/2018   NICM (nonischemic cardiomyopathy) ([place]) 05/01/2013   Overview:  Last Assessment & Plan:  euvolemic on physical exam.    Obesity    Sickle cell trait (HCC)    Thymoma    Temp 97.7 F (36.5 C)   Ht '5\' 9"'$  (1.753 m)   Wt (!) 381 lb (172.8 kg)   LMP 06/17/2012   BMI 56.26 kg/m   Opioid Risk Score:   Fall Risk Score:  `1  Depression screen PHQ 2/9  Depression screen [facility] 2/9 06/20/2020 07/04/2019 03/23/2019  Decreased Interest 0 0 0  Down, Depressed, Hopeless 0 0 0  PHQ - 2 Score 0 0 0  Altered sleeping - - 2  Tired, decreased energy - - 3  Change in appetite - - 0  Feeling bad or  failure about yourself  - - 0  Trouble concentrating - - 1  Moving slowly or fidgety/restless - - 0  Suicidal thoughts - - 0  PHQ-9 Score - - 6  Difficult doing work/chores - - Somewhat difficult  Some recent data might be hidden    Review of Systems  Constitutional: Negative.   HENT: Negative.    Eyes: Negative.   Respiratory: Negative.    Cardiovascular: Negative.   Gastrointestinal: Negative.   Endocrine: Negative.   Genitourinary: Negative.   Musculoskeletal:  Positive for arthralgias, back pain and myalgias.  Skin: Negative.  Allergic/Immunologic: Negative.   Neurological:  Positive for numbness.       Tingling  Hematological: Negative.   Psychiatric/Behavioral: Negative.    All other systems reviewed and are negative.     Objective:   Physical Exam Awake, alert, sitting on table; slightly slumped- forward flexed shoulders, NAD Tight pecs; rhomboids, upper traps, levators, and scalenes- splenius capitus not tight- B/L         Assessment & Plan:   Patient is a 55 yr old female with DM, with ICU myopathy due to COVID here for  f/u. Still has severe difficulty with endurance due to ICU myopathy.    Here for f/u on Long COVID and side effects including incontinence and impaired gait or impaired core strength and endurance. .   Will try and reduce incontinence supplies to half.   2. Patient here for trigger point injections for  Consent done and on chart.  Cleaned areas with alcohol and injected using a 27 gauge 1.5 inch needle  Injected 6cc Using 1% Lidocaine with no EPI  Upper traps B/L Levators B/L  Posterior scalenes B/L Middle scalenes Splenius Capitus Pectoralis Major B/L  Rhomboids B/L  Infraspinatus Teres Major/minor Thoracic paraspinals Lumbar paraspinals Other injections-  upper deltoid B/L   Patient's level of pain prior was 4/10 Current level of pain after injections is 4/10- but shoulders appear more back/less slumped posture.    There was no bleeding or complications.  Patient was advised to drink a lot of water on day after injections to flush system Will have increased soreness for 12-48 hours after injections.  Can use Lidocaine patches the day AFTER injections Can use theracane on day of injections in places didn't inject Can use heating pad 4-6 hours AFTER injections   3. Will try Robaxin/Methocarbemol for 500 mg 3x/day as needed for muscle tightness- 4 days/week.  Can take 3x/in that day.  Can cause a little sleepiness, doesn't increase stroke risk or heart complications.   4. Con't pool therapy- as long as possible. At least 12 weeks.   5. F/U in 3 months-   I spent a total of 28 minutes on pt- 10 minutes on injections and 18 minutes discussing bladder/and muscle tightness

## 2020-08-22 NOTE — Therapy (Signed)
[facility] [address] [place], [place], [address] Phone: [phone]   Fax:  [phone]  Physical Therapy Treatment  Patient Details  Name: Kaitlin [name] MRN: [id number] Date of Birth: November 03, 1965 Referring Provider (PT): Courtney Heys, MD   Encounter Date: 08/22/2020   PT End of Session - 08/22/20 1044     Visit Number 17    Number of Visits 28    Date for PT Re-Evaluation 09/12/20    Authorization Type [employer] Medicare    PT Start Time 1034    PT Stop Time 1132    PT Time Calculation (min) 58 min    Equipment Utilized During Treatment Other (comment)    Activity Tolerance Patient tolerated treatment well    Behavior During Therapy [facility] for tasks assessed/performed             Past Medical History:  Diagnosis Date   Anemia    Bronchitis    Cataracts, bilateral 01/12/2018   CHF (congestive heart failure) ([place])    Diabetes mellitus    High cholesterol    Hypertension    takes medicine to protect kidneys, does not have HTN   Leukocytosis    Morbid obesity ([place]) 09/11/2011   BMI 57   Neuropathy 01/12/2018   NICM (nonischemic cardiomyopathy) ([place]) 05/01/2013   Overview:  Last Assessment & Plan:  euvolemic on physical exam.    Obesity    Sickle cell trait ([place])    Thymoma     Past Surgical History:  Procedure Laterality Date   COLONOSCOPY N/A 06/21/2017   Procedure: COLONOSCOPY;  Surgeon: Ronnette Juniper, MD;  Location: [facility];  Service: Gastroenterology;  Laterality: N/A;   LEFT HEART CATHETERIZATION WITH CORONARY ANGIOGRAM N/A 05/25/2013   Procedure: LEFT HEART CATHETERIZATION WITH CORONARY ANGIOGRAM;  Surgeon: Troy Sine, MD;  Location: [facility] CATH LAB;  Service: Cardiovascular;  Laterality: N/A;   LYMPH NODE BIOPSY Right 11/30/2012   Procedure: RIGHT TONSIL BIOPSY WITH FRESH FROZEN ANALYSIS;  Surgeon: Jodi Marble, MD;  Location: [facility];  Service: ENT;  Laterality: Right;   MEDIASTERNOTOMY N/A 01/12/2013    Procedure: MEDIAN STERNOTOMY;  Surgeon: Gaye Pollack, MD;  Location: [facility];  Service: Thoracic;  Laterality: N/A;   MEDIASTINOTOMY CHAMBERLAIN MCNEIL    Right 11/06/2012   Procedure: MEDIASTINOTOMY CHAMBERLAIN MCNEIL PROCEDURE;  Surgeon: Gaye Pollack, MD;  Location: [facility];  Service: Thoracic;  Laterality: Right;   POLYPECTOMY  06/21/2017   Procedure: POLYPECTOMY;  Surgeon: Ronnette Juniper, MD;  Location: [facility];  Service: Gastroenterology;;   RESECTION OF A THYMOMA N/A 01/12/2013   Procedure: RESECTION OF A THYMOMA;  Surgeon: Gaye Pollack, MD;  [facility];  Service: Thoracic;  Laterality: N/A;   TONSILLECTOMY     TUBAL LIGATION      There were no vitals filed for this visit.   Subjective Assessment - 08/22/20 1037     Subjective " Tired, i've been very busy.  Knees and shoulders a little sore                                       PT Education - 08/22/20 1137     Education Details Ankle vs hip balance strategies. Anatomy of musculature in calf/foot. DF and eversion for countering ant tib/gr toe cramping.              PT Short  Term Goals - 07/23/20 1248       PT SHORT TERM GOAL #1   Title Pt will be independent with initial HEP    Status Achieved      PT SHORT TERM GOAL #2   Title Pt will be ambulating 2 minutes at least 3x in the day for home walking program    Status Achieved      PT SHORT TERM GOAL #3   Title Pt will demo improved double leg press strength with increased 1 RM to 220 lbs    Status Unable to assess               PT Long Term Goals - 07/30/20 1137       PT LONG TERM GOAL #1   Title Pt will be independent with continuing to self progress her exercises at home    Baseline requires further progression, being more mobile    Status On-going      PT LONG TERM GOAL #2   Title Pt will improve 2 MWT to at least 300' to demo improved walking endurance; revised 6MWT to improve by [place]    Baseline 246' on 06/25/20,  690 on 7/20 for 6MWT, MDC 198.98, normative value for her age not available- value for 55 yo is 43'    Status Revised      PT LONG TERM GOAL #3   Title Pt will demo 5x STS of <13 sec to demo improved functional LE strength    Baseline 15s    Status On-going                   Plan - 08/22/20 1044     Clinical Impression Statement Increased focus on shoulders and c-spine today.  Able to get pt submerged (somewhat apprehensively to upper cervical spine for stretching and exercise. Added shoulder retraction and depression in sitting.  Pt demonstrates improved coordination and balance with Ai chi 13 today.  VC for weight shifting/counter balancing using noodle. Added shoulder stretching with hip hinges and with completion of Ai Chi.  Pt with some cramping of right ant tib upon completion of Ai Chi.  Pt edu on anatomy and technique to counter.    Personal Factors and Comorbidities Comorbidity 3+;Time since onset of injury/illness/exacerbation;Fitness;Past/Current Experience    Stability/Clinical Decision Making Evolving/Moderate complexity    Clinical Decision Making Moderate    Rehab Potential Good    PT Frequency 3x / week    PT Treatment/Interventions ADLs/Self Care Home Management;DME Instruction;Gait training;Stair training;Functional mobility training;Therapeutic activities;Balance training;Therapeutic exercise;Neuromuscular re-education;Patient/family education;Energy conservation    PT Next Visit Plan shoulder discomfort??    PT Home Exercise Plan [id number] added 2 exercises for shoulder stretching.  Pt givne copy.               Pt seen for aquatic therapy today.  Treatment took place in water 3.5-4.5 ft in depth at the [place]. Temp of water was 92.  Pt entered/exited the pool via stairs step to pattern with bilat rail.     -Warm up: forward walking x 3 laps, backward walking x 2 laps, and side stepping x 4 laps without UE assist   -Seated: Stretching  gastroc, hamstrings and Lb Sit to stand x 15 Hip hinges x 10    Standing Submerged 90% shoulder add/abd, horizontal abd/add (hand buoy on surface then immersed), flex and extension 2 x10 reps Shoulder shrugs x15 Scapular retraction x 15 Stretching hip flex, abd and  quads 3 x 20 sec   Bilat ue support on yellow noodle strengthening and balance challenges Hip Abd x 12 rep Hip flex 12 reps Hip extension x 12 Ai Chi position 13 modified UE support on noodle.  Pt able to complete 3 reps bilaterally.  VC and TC for proper execution.   Aerobic capacity: simulated running 8 continuous widths of pool in 6 ft    Pt entered Jacuzzi upon completion of session (non-billed) for LB massage.     Pt requires buoyancy for support and to offload joints with strengthening exercises. Viscosity of the water is needed for resistance of strengthening; water current perturbations provides challenge to standing balance unsupported, requiring increased core activation.  Patient will benefit from skilled therapeutic intervention in order to improve the following deficits and impairments:  Abnormal gait, Cardiopulmonary status limiting activity, Decreased activity tolerance, Decreased balance, Decreased coordination, Decreased endurance, Decreased mobility, Difficulty walking, Decreased strength, Impaired sensation, Postural dysfunction, Obesity, Impaired UE functional use, Decreased range of motion, Pain  Visit Diagnosis: Muscle weakness (generalized)  Unsteadiness on feet  Attention and concentration deficit  Other abnormalities of gait and mobility  Pain in right arm  Critical illness myopathy  Pain in left arm  Stiffness of left shoulder, not elsewhere classified     Problem List Patient Active Problem List   Diagnosis Date Noted   Long COVID 01/02/2020   Mixed stress and urge urinary incontinence 07/04/2019   Nerve pain 05/16/2019   Myofascial pain 05/16/2019   Subluxation of  acromioclavicular joint, right, sequela 03/23/2019   Sickle cell trait ([place])    Labile blood glucose    Diabetic peripheral neuropathy (HCC)    Hypokalemia    Pain in joint of left shoulder    Debility    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   COVID-19    History of anemia    Super obese    Poorly controlled type 2 diabetes mellitus with peripheral neuropathy ([place])    Acute on chronic diastolic (congestive) heart failure (HCC)    Hypernatremia    Shock circulatory ([place])    Septic shock ([place])    Hypotension 01/13/2019   Acute respiratory failure with hypoxemia ([place]) 01/13/2019   Acute respiratory distress syndrome (ARDS) due to COVID-19 virus ([place]) 01/12/2019   Grade I diastolic dysfunction [id number]   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   Dyspnea on exertion 05/19/2016   Other fatigue 05/19/2016   Absolute anemia 04/15/2016   Left leg weakness 10/18/2014   Left leg paresthesias 10/18/2014   Acute left-sided weakness 10/18/2014   Encounter for central line placement 03/08/2014   Taking drug for chronic disease 03/08/2014   H/O insertion of insulin pump 02/18/2014   Long term current use of insulin ([place]) 02/18/2014   Insulin pump in place 02/18/2014   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   Abnormal ballistocardiogram 05/18/2013   Abnormal nuclear stress test 05/18/2013   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   NICM (nonischemic cardiomyopathy) ([place]) 05/01/2013   Insulin dependent type 2 diabetes mellitus ([place]) 04/10/2013   Sex counseling 04/10/2013   Edema leg 04/10/2013   Upper respiratory infection, viral 04/10/2013   Pedal edema 03/29/2013   Pneumonia 12/12/2012   Essential hypertension 12/12/2012   HLD (hyperlipidemia) 12/12/2012   Tonsillar mass 12/01/2012   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 [place], [place], [address] Phone: [phone]   Fax:  [phone]  Name: Kaitlin [name] MRN: [id number] Date of Birth: 23-Feb-1965

## 2020-08-22 NOTE — Patient Instructions (Signed)
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 Still has severe difficulty with endurance due to ICU myopathy.    Here for f/u on Long COVID and side effects including incontinence and impaired gait or impaired core strength and endurance. .   Will try and reduce incontinence supplies to half.   2. Patient here for trigger point injections for  Consent done and on chart.  Cleaned areas with alcohol and injected using a 27 gauge 1.5 inch needle  Injected 6cc Using 1% Lidocaine with no EPI  Upper traps B/L Levators B/L  Posterior scalenes B/L Middle scalenes Splenius Capitus Pectoralis Major B/L  Rhomboids B/L  Infraspinatus Teres Major/minor Thoracic paraspinals Lumbar paraspinals Other injections-  upper deltoid B/L   Patient's level of pain prior was 4/10 Current level of pain after injections is 4/10- but shoulders appear more back/less slumped posture.   There was no bleeding or complications.  Patient was advised to drink a lot of water on day after injections to flush system Will have increased soreness for 12-48 hours after injections.  Can use Lidocaine patches the day AFTER injections Can use theracane on day of injections in places didn't inject Can use heating pad 4-6 hours AFTER injections   3. Will try Robaxin/Methocarbemol for 500 mg 3x/day as needed for muscle tightness- 4 days/week.  Can take 3x/in that day.  Can cause a little sleepiness, doesn't increase stroke risk or heart complications.   4. Con't pool therapy- as long as possible. At least 12 weeks.   5. F/U in 3 months-

## 2020-08-25 ENCOUNTER — Ambulatory Visit (HOSPITAL_BASED_OUTPATIENT_CLINIC_OR_DEPARTMENT_OTHER): Payer: Medicare Other | Admitting: Physical Therapy

## 2020-08-25 ENCOUNTER — Other Ambulatory Visit: Payer: Self-pay

## 2020-08-25 ENCOUNTER — Encounter (HOSPITAL_BASED_OUTPATIENT_CLINIC_OR_DEPARTMENT_OTHER): Payer: Self-pay | Admitting: Physical Therapy

## 2020-08-25 DIAGNOSIS — M6281 Muscle weakness (generalized): Secondary | ICD-10-CM | POA: Diagnosis not present

## 2020-08-25 DIAGNOSIS — R2681 Unsteadiness on feet: Secondary | ICD-10-CM

## 2020-08-25 DIAGNOSIS — R2689 Other abnormalities of gait and mobility: Secondary | ICD-10-CM

## 2020-08-25 NOTE — Therapy (Signed)
[facility] [place], [place], [address] Phone: [phone]   Fax:  [phone]  Physical Therapy Treatment Progress Note Reporting Period 06/25/20 to 08/25/2020  See note below for Objective Data and Assessment of Progress/Goals.     Patient Details  Name: Kaitlin [name] MRN: [id number] Date of Birth: 07/15/1965 Referring Provider (PT): Courtney Heys, MD   Encounter Date: 08/25/2020   PT End of Session - 08/25/20 1349     Visit Number 18    Number of Visits 28    Date for PT Re-Evaluation 09/12/20    Progress Note Due on Visit 28    PT Start Time [id number]    PT Stop Time [id number]    PT Time Calculation (min) 41 min    Activity Tolerance Patient tolerated treatment well    Behavior During Therapy [facility] for tasks assessed/performed             Past Medical History:  Diagnosis Date   Anemia    Bronchitis    Cataracts, bilateral 01/12/2018   CHF (congestive heart failure) ([place])    Diabetes mellitus    High cholesterol    Hypertension    takes medicine to protect kidneys, does not have HTN   Leukocytosis    Morbid obesity ([place]) 09/11/2011   BMI 57   Neuropathy 01/12/2018   NICM (nonischemic cardiomyopathy) ([place]) 05/01/2013   Overview:  Last Assessment & Plan:  euvolemic on physical exam.    Obesity    Sickle cell trait ([place])    Thymoma     Past Surgical History:  Procedure Laterality Date   COLONOSCOPY N/A 06/21/2017   Procedure: COLONOSCOPY;  Surgeon: Ronnette Juniper, MD;  [facility];  Service: Gastroenterology;  Laterality: N/A;   LEFT HEART CATHETERIZATION WITH CORONARY ANGIOGRAM N/A 05/25/2013   Procedure: LEFT HEART CATHETERIZATION WITH CORONARY ANGIOGRAM;  Surgeon: Troy Sine, MD;  Location: [facility] CATH LAB;  Service: Cardiovascular;  Laterality: N/A;   LYMPH NODE BIOPSY Right 11/30/2012   Procedure: RIGHT TONSIL BIOPSY WITH FRESH FROZEN ANALYSIS;  Surgeon: Jodi Marble, MD;  Location: [facility];   Service: ENT;  Laterality: Right;   MEDIASTERNOTOMY N/A 01/12/2013   Procedure: MEDIAN STERNOTOMY;  Surgeon: Gaye Pollack, MD;  Location: [facility];  Service: Thoracic;  Laterality: N/A;   MEDIASTINOTOMY CHAMBERLAIN MCNEIL    Right 11/06/2012   Procedure: MEDIASTINOTOMY CHAMBERLAIN MCNEIL PROCEDURE;  Surgeon: Gaye Pollack, MD;  Location: [facility];  Service: Thoracic;  Laterality: Right;   POLYPECTOMY  06/21/2017   Procedure: POLYPECTOMY;  Surgeon: Ronnette Juniper, MD;  Location: [facility];  Service: Gastroenterology;;   RESECTION OF A THYMOMA N/A 01/12/2013   Procedure: RESECTION OF A THYMOMA;  Surgeon: Gaye Pollack, MD;  Location[facility];  Service: Thoracic;  Laterality: N/A;   TONSILLECTOMY     TUBAL LIGATION      There were no vitals filed for this visit.   Subjective Assessment - 08/25/20 1350     Subjective I love the aquatic exercise. feeling more confident and no longer using life vest. Everything has improved but everything could improve more. I was able to walk briskly from parking today to clinic. I feel like I am doing better in being present wiht my grandchildren without relying on other people so much. I find I get up without using my hands so much. Still have some slump in posture but does not feel as bad. Stairs have gotten easier but I  still have to pause to catch breath at the top of my 18 steps but they are better.    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  Blood Glucose monitor--needs to be w/n 20 feet of receiver    How long can you stand comfortably? only sometimes need to lean for support when taking shower    How long can you walk comfortably? able to do a brisk walk to gym today ( approx 300 feet)    Patient Stated Goals wants to be able to walk normal and not feel like she is going to topple over. standing up is difficult    Currently in Pain? Yes    Pain Score 2     Pain Location Knee    Pain  Orientation Right    Pain Descriptors / Indicators Sore    Aggravating Factors  wwalking    Pain Relieving Factors rest                [facility] PT Assessment - 08/25/20 0001       Assessment   Medical Diagnosis Long COVID.    Referring Provider (PT) Courtney Heys, MD    Onset Date/Surgical Date 01/12/19    Hand Dominance Right    Prior Therapy inpatient rehab, HHOT/PT      Precautions   Precautions None      Restrictions   Weight Bearing Restrictions No      Balance Screen   Has the patient fallen in the past 6 months No      [place] residence    Available Help at Discharge Family    Type of [place] to enter    Entrance Stairs-Number of Steps 18    Entrance Stairs-Rails Can reach both    Additional Comments daughter does most of the cleaning, can bathe and dress, just takes a lot of effort and energy, takes longer than what she is used to, cares for grand kids: 2, 4 & 9      Prior Function   Level of Independence Independent    Vocation On disability    Leisure Cook, grandchildren      6 Minute Walk- Baseline   HR (bpm) 89      6 Minute walk- Post Test   HR (bpm) 121    02 Sat (%RA) 96 %      6 minute walk test results    Aerobic Endurance Distance Walked 608      Standardized Balance Assessment   Five times sit to stand comments  15s                                   PT Education - 08/25/20 2020     Education Details objective measures, progress and change, POC    Person(s) Educated Patient    Methods Explanation    Comprehension Verbalized understanding;Need further instruction              PT Short Term Goals - 07/23/20 1248       PT SHORT TERM GOAL #1   Title Pt will be independent with initial HEP    Status Achieved      PT SHORT TERM GOAL #2   Title Pt will be ambulating 2 minutes at least 3x in the day for  home walking program    Status  Achieved      PT SHORT TERM GOAL #3   Title Pt will demo improved double leg press strength with increased 1 RM to 220 lbs    Status Unable to assess               PT Long Term Goals - 08/25/20 1358       PT LONG TERM GOAL #1   Title Pt will be independent with continuing to self progress her exercises at home    Baseline requires further progression, being more mobile    Status On-going      PT LONG TERM GOAL #2   Title Pt will improve 2 MWT to at least 300' to demo improved walking endurance; revised 6MWT to improve by [facility]    Baseline 246' on 06/25/20, 690 on 7/20 for 6MWT, 608 on 8/15, [place] 198.98, normative value for her age not available- value for 55 yo is 40'    Status On-going      PT LONG TERM GOAL #3   Title Pt will demo 5x STS of <13 sec to demo improved functional LE strength    Baseline 15s today    Status On-going      PT LONG TERM GOAL #4   Title pt will climb 12 stairs available in clinic- HR <100 bpm to decr need for rest break when she is going home    Baseline ascent took 22s HR to 117 bpm, resting HR 88 bpm    Status New                   Plan - 08/25/20 2021     Clinical Impression Statement Pt did travel a shorter distance in her 6MWT but maintained a lower HR and higher O2 sat during the test. She did take 3 seated rest breaks with chest discomfort at the end- thinks it was soreness from trigger point injections. MD and PT agree she would benefit from further skilled PT to address symptoms while monitoring cardiac and respiratory symptoms in order to continue progressing toward functional goals.    PT Treatment/Interventions ADLs/Self Care Home Management;DME Instruction;Gait training;Stair training;Functional mobility training;Therapeutic activities;Balance training;Therapeutic exercise;Neuromuscular re-education;Patient/family education;Energy conservation    PT Next Visit Plan cont cardio challenges    PT Home Exercise Plan [id number] added  2 exercises for shoulder stretching.  Pt givencopy.    Consulted and Agree with Plan of Care Patient             Patient will benefit from skilled therapeutic intervention in order to improve the following deficits and impairments:  Abnormal gait, Cardiopulmonary status limiting activity, Decreased activity tolerance, Decreased balance, Decreased coordination, Decreased endurance, Decreased mobility, Difficulty walking, Decreased strength, Impaired sensation, Postural dysfunction, Obesity, Impaired UE functional use, Decreased range of motion, Pain  Visit Diagnosis: Muscle weakness (generalized)  Unsteadiness on feet  Other abnormalities of gait and mobility     Problem List Patient Active Problem List   Diagnosis Date Noted   Long COVID 01/02/2020   Mixed stress and urge urinary incontinence 07/04/2019   Nerve pain 05/16/2019   Myofascial pain 05/16/2019   Subluxation of acromioclavicular joint, right, sequela 03/23/2019   Sickle cell trait (HCC)    Labile blood glucose    Diabetic peripheral neuropathy (HCC)    Hypokalemia    Pain in joint of left shoulder    Debility    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  COVID-19    History of anemia    Super obese    Poorly controlled type 2 diabetes mellitus with peripheral neuropathy (HCC)    Acute on chronic diastolic (congestive) heart failure (HCC)    Hypernatremia    Shock circulatory (HCC)    Septic shock (HCC)    Hypotension 01/13/2019   Acute respiratory failure with hypoxemia ([place]) 01/13/2019   Acute respiratory distress syndrome (ARDS) due to COVID-19 virus ([place]) 01/12/2019   Grade I diastolic dysfunction [id number]   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   Dyspnea on exertion 05/19/2016   Other fatigue 05/19/2016   Absolute anemia 04/15/2016   Left leg weakness 10/18/2014   Left leg paresthesias 10/18/2014   Acute left-sided weakness 10/18/2014   Encounter for central line placement 03/08/2014   Taking drug for  chronic disease 03/08/2014   H/O insertion of insulin pump 02/18/2014   Long term current use of insulin ([place]) 02/18/2014   Insulin pump in place 02/18/2014   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   Abnormal ballistocardiogram 05/18/2013   Abnormal nuclear stress test 05/18/2013   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   NICM (nonischemic cardiomyopathy) ([place]) 05/01/2013   Insulin dependent type 2 diabetes mellitus ([place]) 04/10/2013   Sex counseling 04/10/2013   Edema leg 04/10/2013   Upper respiratory infection, viral 04/10/2013   Pedal edema 03/29/2013   Pneumonia 12/12/2012   Essential hypertension 12/12/2012   HLD (hyperlipidemia) 12/12/2012   Tonsillar mass 12/01/2012   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 Savahanna Almendariz PT, DPT 08/25/20 8:32 PM   [facility] [address] [place], [place], [address] Phone: [phone]   Fax:  [phone]  Name: GRETEL VINK MRN: [id number] Date of Birth: March 18, 1965

## 2020-08-27 ENCOUNTER — Encounter (HOSPITAL_BASED_OUTPATIENT_CLINIC_OR_DEPARTMENT_OTHER): Payer: Self-pay | Admitting: Physical Therapy

## 2020-08-27 ENCOUNTER — Ambulatory Visit (HOSPITAL_BASED_OUTPATIENT_CLINIC_OR_DEPARTMENT_OTHER): Payer: Medicare Other | Admitting: Physical Therapy

## 2020-08-27 ENCOUNTER — Other Ambulatory Visit: Payer: Self-pay

## 2020-08-27 DIAGNOSIS — M6281 Muscle weakness (generalized): Secondary | ICD-10-CM

## 2020-08-27 DIAGNOSIS — M79601 Pain in right arm: Secondary | ICD-10-CM

## 2020-08-27 DIAGNOSIS — R2689 Other abnormalities of gait and mobility: Secondary | ICD-10-CM

## 2020-08-27 DIAGNOSIS — R262 Difficulty in walking, not elsewhere classified: Secondary | ICD-10-CM

## 2020-08-27 DIAGNOSIS — R41840 Attention and concentration deficit: Secondary | ICD-10-CM

## 2020-08-27 DIAGNOSIS — R2681 Unsteadiness on feet: Secondary | ICD-10-CM

## 2020-08-27 DIAGNOSIS — M25612 Stiffness of left shoulder, not elsewhere classified: Secondary | ICD-10-CM

## 2020-08-27 DIAGNOSIS — G7281 Critical illness myopathy: Secondary | ICD-10-CM

## 2020-08-27 DIAGNOSIS — M79602 Pain in left arm: Secondary | ICD-10-CM

## 2020-08-27 NOTE — Therapy (Signed)
[facility] [address] [place], [place], [address] Phone: [phone]   Fax:  [phone]  Physical Therapy Treatment  Patient Details  Name: Kaitlin [name] MRN: [id number] Date of Birth: Aug 09, 1965 Referring Provider (PT): Courtney Heys, MD   Encounter Date: 08/27/2020   PT End of Session - 08/27/20 1212     Visit Number 19    Number of Visits 28    Date for PT Re-Evaluation 09/12/20    Authorization Type [employer] Medicare    PT Start Time 1158    PT Stop Time [id number]    PT Time Calculation (min) 49 min    Equipment Utilized During Treatment Other (comment)    Activity Tolerance Patient tolerated treatment well    Behavior During Therapy [facility] for tasks assessed/performed             Past Medical History:  Diagnosis Date   Anemia    Bronchitis    Cataracts, bilateral 01/12/2018   CHF (congestive heart failure) ([place])    Diabetes mellitus    High cholesterol    Hypertension    takes medicine to protect kidneys, does not have HTN   Leukocytosis    Morbid obesity ([place]) 09/11/2011   BMI 57   Neuropathy 01/12/2018   NICM (nonischemic cardiomyopathy) ([place]) 05/01/2013   Overview:  Last Assessment & Plan:  euvolemic on physical exam.    Obesity    Sickle cell trait ([place])    Thymoma     Past Surgical History:  Procedure Laterality Date   COLONOSCOPY N/A 06/21/2017   Procedure: COLONOSCOPY;  Surgeon: Ronnette Juniper, MD;  Location: [facility];  Service: Gastroenterology;  Laterality: N/A;   LEFT HEART CATHETERIZATION WITH CORONARY ANGIOGRAM N/A 05/25/2013   Procedure: LEFT HEART CATHETERIZATION WITH CORONARY ANGIOGRAM;  Surgeon: Troy Sine, MD;  [facility]: [facility] CATH LAB;  Service: Cardiovascular;  Laterality: N/A;   LYMPH NODE BIOPSY Right 11/30/2012   Procedure: RIGHT TONSIL BIOPSY WITH FRESH FROZEN ANALYSIS;  Surgeon: Jodi Marble, MD;  Location: [facility];  Service: ENT;  Laterality: Right;   MEDIASTERNOTOMY N/A 01/12/2013    Procedure: MEDIAN STERNOTOMY;  Surgeon: Gaye Pollack, MD;  Location: [facility];  Service: Thoracic;  Laterality: N/A;   MEDIASTINOTOMY CHAMBERLAIN MCNEIL    Right 11/06/2012   Procedure: MEDIASTINOTOMY CHAMBERLAIN MCNEIL PROCEDURE;  Surgeon: Gaye Pollack, MD;  Location: [facility];  Service: Thoracic;  Laterality: Right;   POLYPECTOMY  06/21/2017   Procedure: POLYPECTOMY;  Surgeon: Ronnette Juniper, MD;  Location: [facility];  Service: Gastroenterology;;   RESECTION OF A THYMOMA N/A 01/12/2013   Procedure: RESECTION OF A THYMOMA;  Surgeon: Gaye Pollack, MD;  [facility]: [facility];  Service: Thoracic;  Laterality: N/A;   TONSILLECTOMY     TUBAL LIGATION      There were no vitals filed for this visit.   Subjective Assessment - 08/27/20 1205     Subjective I still dont feel great since after my Monday session.  My chest is still a little soar.  Saw MD yesterday and my BP was a little high                                         PT Short Term Goals - 07/23/20 1248       PT SHORT TERM GOAL #1   Title Pt will be independent with initial  HEP    Status Achieved      PT SHORT TERM GOAL #2   Title Pt will be ambulating 2 minutes at least 3x in the day for home walking program    Status Achieved      PT SHORT TERM GOAL #3   Title Pt will demo improved double leg press strength with increased 1 RM to 220 lbs    Status Unable to assess               PT Long Term Goals - 08/25/20 1358       PT LONG TERM GOAL #1   Title Pt will be independent with continuing to self progress her exercises at home    Baseline requires further progression, being more mobile    Status On-going      PT LONG TERM GOAL #2   Title Pt will improve 2 MWT to at least 300' to demo improved walking endurance; revised 6MWT to improve by [facility]    Baseline 246' on 06/25/20, 690 on 7/20 for 6MWT, 608 on 8/15, [place] 198.98, normative value for her age not available- value for 55 yo is 106'    Status  On-going      PT LONG TERM GOAL #3   Title Pt will demo 5x STS of <13 sec to demo improved functional LE strength    Baseline 15s today    Status On-going      PT LONG TERM GOAL #4   Title pt will climb 12 stairs available in clinic- HR <100 bpm to decr need for rest break when she is going home    Baseline ascent took 22s HR to 117 bpm, resting HR 88 bpm    Status New           Pt seen for aquatic therapy today.  Treatment took place in water 3.5-4.5 ft in depth at the [facility]. Temp of water was 92.  Pt entered/exited the pool via stairs step to pattern with bilat rail.     -Warm up: forward walking x 3 laps, backward walking x 2 laps, and side stepping x 4 laps without UE assist   -Seated: Stretching gastroc, hamstrings and Lb Flutter kicking sitting on edge of seat, VC for tight core 3 sets of 20 Sit to stand 2 x 15 Hip hinges  x 15   Standing Step ups on bottom step x 10 leading with each LE 2 x 10 reps.  VC for increased speed.  Bilat ue support on yellow noodle strengthening and balance challenges Hip Abd 2 x 10 rep Hip flex 2 x 10 reps Hip extension x 12 Ai Chi position 13 modified UE support on noodle x 5 reps. VC and TC for proper execution.      Pt entered Jacuzzi upon completion of session (non-billed) for LB massage.     Pt requires buoyancy for support and to offload joints with strengthening exercises. Viscosity of the water is needed for resistance of strengthening; water current perturbations provides challenge to standing balance unsupported, requiring increased core activation.        Plan - 08/27/20 1159     Clinical Impression Statement O2 sats taken upon arrival walking from parkinglot 97% pulse 62, BS 193. Pt BS dropped quickly today to 122 in 30 mins.  She was given ~ 2 ounces of grape juice. Upon completion of session BS at 106. she finished 10 oz of grape juice.  She requires multiple  rest periods. Stretching of bilateral  gastrocs added on step (pt c/o cramping after walking distance from parkinglot to pool).    Personal Factors and Comorbidities Comorbidity 3+;Time since onset of injury/illness/exacerbation;Fitness;Past/Current Experience    PT Treatment/Interventions ADLs/Self Care Home Management;DME Instruction;Gait training;Stair training;Functional mobility training;Therapeutic activities;Balance training;Therapeutic exercise;Neuromuscular re-education;Patient/family education;Energy conservation    PT Home Exercise Plan [id number] added 2 exercises for shoulder stretching.  Pt givencopy.             Patient will benefit from skilled therapeutic intervention in order to improve the following deficits and impairments:  Abnormal gait, Cardiopulmonary status limiting activity, Decreased activity tolerance, Decreased balance, Decreased coordination, Decreased endurance, Decreased mobility, Difficulty walking, Decreased strength, Impaired sensation, Postural dysfunction, Obesity, Impaired UE functional use, Decreased range of motion, Pain  Visit Diagnosis: Muscle weakness (generalized)  Attention and concentration deficit  Pain in left arm  Stiffness of left shoulder, not elsewhere classified  Pain in right arm  Unsteadiness on feet  Other abnormalities of gait and mobility  Critical illness myopathy  Difficulty in walking, not elsewhere classified     Problem List Patient Active Problem List   Diagnosis Date Noted   Long COVID 01/02/2020   Mixed stress and urge urinary incontinence 07/04/2019   Nerve pain 05/16/2019   Myofascial pain 05/16/2019   Subluxation of acromioclavicular joint, right, sequela 03/23/2019   Sickle cell trait ([place])    Labile blood glucose    Diabetic peripheral neuropathy (HCC)    Hypokalemia    Pain in joint of left shoulder    Debility    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   COVID-19    History of anemia    Super obese    Poorly controlled type 2  diabetes mellitus with peripheral neuropathy ([place])    Acute on chronic diastolic (congestive) heart failure (HCC)    Hypernatremia    Shock circulatory (HCC)    Septic shock (HCC)    Hypotension 01/13/2019   Acute respiratory failure with hypoxemia ([place]) 01/13/2019   Acute respiratory distress syndrome (ARDS) due to COVID-19 virus ([place]) 01/12/2019   Grade I diastolic dysfunction [id number]   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   Dyspnea on exertion 05/19/2016   Other fatigue 05/19/2016   Absolute anemia 04/15/2016   Left leg weakness 10/18/2014   Left leg paresthesias 10/18/2014   Acute left-sided weakness 10/18/2014   Encounter for central line placement 03/08/2014   Taking drug for chronic disease 03/08/2014   H/O insertion of insulin pump 02/18/2014   Long term current use of insulin ([place]) 02/18/2014   Insulin pump in place 02/18/2014   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   Abnormal ballistocardiogram 05/18/2013   Abnormal nuclear stress test 05/18/2013   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   NICM (nonischemic cardiomyopathy) ([place]) 05/01/2013   Insulin dependent type 2 diabetes mellitus ([place]) 04/10/2013   Sex counseling 04/10/2013   Edema leg 04/10/2013   Upper respiratory infection, viral 04/10/2013   Pedal edema 03/29/2013   Pneumonia 12/12/2012   Essential hypertension 12/12/2012   HLD (hyperlipidemia) 12/12/2012   Tonsillar mass 12/01/2012   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 [place], [place], [address] Phone: [phone]   Fax:  [phone]  Name: Kaitlin [name] MRN: [id number] Date  of Birth: 1965/02/11

## 2020-08-29 ENCOUNTER — Ambulatory Visit (HOSPITAL_BASED_OUTPATIENT_CLINIC_OR_DEPARTMENT_OTHER): Payer: Medicare Other | Admitting: Physical Therapy

## 2020-08-29 ENCOUNTER — Encounter (HOSPITAL_BASED_OUTPATIENT_CLINIC_OR_DEPARTMENT_OTHER): Payer: Self-pay

## 2020-09-01 ENCOUNTER — Ambulatory Visit (HOSPITAL_BASED_OUTPATIENT_CLINIC_OR_DEPARTMENT_OTHER): Payer: Medicare Other | Admitting: Physical Therapy

## 2020-09-03 ENCOUNTER — Ambulatory Visit (HOSPITAL_BASED_OUTPATIENT_CLINIC_OR_DEPARTMENT_OTHER): Payer: Medicare Other | Admitting: Physical Therapy

## 2020-09-03 ENCOUNTER — Encounter (HOSPITAL_BASED_OUTPATIENT_CLINIC_OR_DEPARTMENT_OTHER): Payer: Self-pay

## 2020-09-05 ENCOUNTER — Encounter (HOSPITAL_BASED_OUTPATIENT_CLINIC_OR_DEPARTMENT_OTHER): Payer: Self-pay | Admitting: Physical Therapy

## 2020-09-05 ENCOUNTER — Ambulatory Visit (HOSPITAL_BASED_OUTPATIENT_CLINIC_OR_DEPARTMENT_OTHER): Payer: Medicare Other | Admitting: Physical Therapy

## 2020-09-05 ENCOUNTER — Other Ambulatory Visit: Payer: Self-pay

## 2020-09-05 DIAGNOSIS — G7281 Critical illness myopathy: Secondary | ICD-10-CM

## 2020-09-05 DIAGNOSIS — R2681 Unsteadiness on feet: Secondary | ICD-10-CM

## 2020-09-05 DIAGNOSIS — M6281 Muscle weakness (generalized): Secondary | ICD-10-CM

## 2020-09-05 DIAGNOSIS — R41840 Attention and concentration deficit: Secondary | ICD-10-CM

## 2020-09-05 DIAGNOSIS — M25612 Stiffness of left shoulder, not elsewhere classified: Secondary | ICD-10-CM

## 2020-09-05 DIAGNOSIS — R2689 Other abnormalities of gait and mobility: Secondary | ICD-10-CM

## 2020-09-05 DIAGNOSIS — M79601 Pain in right arm: Secondary | ICD-10-CM

## 2020-09-05 DIAGNOSIS — M79602 Pain in left arm: Secondary | ICD-10-CM

## 2020-09-05 DIAGNOSIS — R262 Difficulty in walking, not elsewhere classified: Secondary | ICD-10-CM

## 2020-09-05 NOTE — Therapy (Signed)
[facility] [address] [place], [place], [address] Phone: [phone]   Fax:  [phone]  Physical Therapy Treatment  Patient Details  Name: Kaitlin [name] MRN: [id number] Date of Birth: 05-Mar-1965 Referring Provider (PT): Courtney Heys, MD   Encounter Date: 09/05/2020   PT End of Session - 09/05/20 1046     Visit Number 20    Number of Visits 28    Date for PT Re-Evaluation 09/12/20    PT Start Time [id number]    PT Stop Time 1115    PT Time Calculation (min) 39 min    Equipment Utilized During Treatment Other (comment)    Activity Tolerance Patient tolerated treatment well             Past Medical History:  Diagnosis Date   Anemia    Bronchitis    Cataracts, bilateral 01/12/2018   CHF (congestive heart failure) ([place])    Diabetes mellitus    High cholesterol    Hypertension    takes medicine to protect kidneys, does not have HTN   Leukocytosis    Morbid obesity ([place]) 09/11/2011   BMI 57   Neuropathy 01/12/2018   NICM (nonischemic cardiomyopathy) ([place]) 05/01/2013   Overview:  Last Assessment & Plan:  euvolemic on physical exam.    Obesity    Sickle cell trait ([place])    Thymoma     Past Surgical History:  Procedure Laterality Date   COLONOSCOPY N/A 06/21/2017   Procedure: COLONOSCOPY;  Surgeon: Ronnette Juniper, MD;  [facility]: [facility];  Service: Gastroenterology;  Laterality: N/A;   LEFT HEART CATHETERIZATION WITH CORONARY ANGIOGRAM N/A 05/25/2013   Procedure: LEFT HEART CATHETERIZATION WITH CORONARY ANGIOGRAM;  Surgeon: Troy Sine, MD;  Location: [facility] CATH LAB;  Service: Cardiovascular;  Laterality: N/A;   LYMPH NODE BIOPSY Right 11/30/2012   Procedure: RIGHT TONSIL BIOPSY WITH FRESH FROZEN ANALYSIS;  Surgeon: Jodi Marble, MD;  Location: [facility];  Service: ENT;  Laterality: Right;   MEDIASTERNOTOMY N/A 01/12/2013   Procedure: MEDIAN STERNOTOMY;  Surgeon: Gaye Pollack, MD;  Location: [facility];  Service: Thoracic;   Laterality: N/A;   MEDIASTINOTOMY CHAMBERLAIN MCNEIL    Right 11/06/2012   Procedure: MEDIASTINOTOMY CHAMBERLAIN MCNEIL PROCEDURE;  Surgeon: Gaye Pollack, MD;  Location: [facility];  Service: Thoracic;  Laterality: Right;   POLYPECTOMY  06/21/2017   Procedure: POLYPECTOMY;  Surgeon: Ronnette Juniper, MD;  Location: [facility];  Service: Gastroenterology;;   RESECTION OF A THYMOMA N/A 01/12/2013   Procedure: RESECTION OF A THYMOMA;  Surgeon: Gaye Pollack, MD;  [facility];  Service: Thoracic;  Laterality: N/A;   TONSILLECTOMY     TUBAL LIGATION      There were no vitals filed for this visit.   Subjective Assessment - 09/05/20 1055     Subjective "Everything is ok.  Pain about normal"  Missed last visit due to family emergency.    Currently in Pain? Yes    Pain Score 5     Pain Orientation Right    Pain Descriptors / Indicators Aching    Multiple Pain Sites Yes    Pain Score 5    Pain Location Back    Pain Orientation Mid    Pain Descriptors / Indicators Aching    Pain Type Chronic pain    Pain Onset More than a month ago    Pain Relieving Factors resting and aquatic therapy/immersion  PT Short Term Goals - 07/23/20 1248       PT SHORT TERM GOAL #1   Title Pt will be independent with initial HEP    Status Achieved      PT SHORT TERM GOAL #2   Title Pt will be ambulating 2 minutes at least 3x in the day for home walking program    Status Achieved      PT SHORT TERM GOAL #3   Title Pt will demo improved double leg press strength with increased 1 RM to 220 lbs    Status Unable to assess               PT Long Term Goals - 08/25/20 1358       PT LONG TERM GOAL #1   Title Pt will be independent with continuing to self progress her exercises at home    Baseline requires further progression, being more mobile    Status On-going      PT LONG TERM GOAL #2   Title Pt will improve 2 MWT to at least 300'  to demo improved walking endurance; revised 6MWT to improve by [facility]    Baseline 246' on 06/25/20, 690 on 7/20 for 6MWT, 608 on 8/15, [place] 198.98, normative value for her age not available- value for 55 yo is 70'    Status On-going      PT LONG TERM GOAL #3   Title Pt will demo 5x STS of <13 sec to demo improved functional LE strength    Baseline 15s today    Status On-going      PT LONG TERM GOAL #4   Title pt will climb 12 stairs available in clinic- HR <100 bpm to decr need for rest break when she is going home    Baseline ascent took 22s HR to 117 bpm, resting HR 88 bpm    Status New            Pt seen for aquatic therapy today.  Treatment took place in water 3.5-4.5 ft in depth at the [facility]. Temp of water was 92.  Pt entered/exited the pool via stairs step to pattern with bilat rail.     -Warm up: forward walking x 3 laps, backward walking x 2 laps, and side stepping x 4 laps without UE assist   -Seated: Knee flex/ext with ankle buoy 2 x 10 reps VC for increased speed. Stretching gastroc, hamstrings and Lb Flutter kicking sitting on edge of seat, VC for tight core 3 sets of 20    Standing Step ups on bottom step leading with each LE 2 x 10 reps.  Backward x 5 R/L VC for increased speed. Some discomfort in right knee with backward step up   Bilat ue support on yellow noodle strengthening and balance challenges Hip Abd 2 x 10 rep Hip flex 2 x 10 reps Hip extension 2 x 10 Ai Chi position 13 modified UE support on noodle x 5 reps. VC and TC for proper execution. Noted: pt completes with more accuracy, fewer LOB.  Fast water walking 6 widths for cardio-pulmonary benefit   O2 sats 99% pulse 113 Upon completion of session  Pt entered Jacuzzi upon completion of session (non-billed) for LB massage.     Pt requires buoyancy for support and to offload joints with strengthening exercises. Viscosity of the water is needed for resistance of strengthening; water  current perturbations provides challenge to standing balance unsupported, requiring increased core activation.  Patient will benefit from skilled therapeutic intervention in order to improve the following deficits and impairments:  Abnormal gait, Cardiopulmonary status limiting activity, Decreased activity tolerance, Decreased balance, Decreased coordination, Decreased endurance, Decreased mobility, Difficulty walking, Decreased strength, Impaired sensation, Postural dysfunction, Obesity, Impaired UE functional use, Decreased range of motion, Pain  Visit Diagnosis: Muscle weakness (generalized)  Unsteadiness on feet  Other abnormalities of gait and mobility  Attention and concentration deficit  Pain in left arm  Critical illness myopathy  Stiffness of left shoulder, not elsewhere classified  Difficulty in walking, not elsewhere classified  Pain in right arm     Problem List Patient Active Problem List   Diagnosis Date Noted   Long COVID 01/02/2020   Mixed stress and urge urinary incontinence 07/04/2019   Nerve pain 05/16/2019   Myofascial pain 05/16/2019   Subluxation of acromioclavicular joint, right, sequela 03/23/2019   Sickle cell trait ([place])    Labile blood glucose    Diabetic peripheral neuropathy (HCC)    Hypokalemia    Pain in joint of left shoulder    Debility    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   COVID-19    History of anemia    Super obese    Poorly controlled type 2 diabetes mellitus with peripheral neuropathy ([place])    Acute on chronic diastolic (congestive) heart failure (HCC)    Hypernatremia    Shock circulatory (HCC)    Septic shock (HCC)    Hypotension 01/13/2019   Acute respiratory failure with hypoxemia ([place]) 01/13/2019   Acute respiratory distress syndrome (ARDS) due to COVID-19 virus ([place]) 01/12/2019   Grade I diastolic dysfunction [id number]   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   Dyspnea on exertion 05/19/2016   Other fatigue 05/19/2016    Absolute anemia 04/15/2016   Left leg weakness 10/18/2014   Left leg paresthesias 10/18/2014   Acute left-sided weakness 10/18/2014   Encounter for central line placement 03/08/2014   Taking drug for chronic disease 03/08/2014   H/O insertion of insulin pump 02/18/2014   Long term current use of insulin ([place]) 02/18/2014   Insulin pump in place 02/18/2014   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   Abnormal ballistocardiogram 05/18/2013   Abnormal nuclear stress test 05/18/2013   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   NICM (nonischemic cardiomyopathy) ([place]) 05/01/2013   Insulin dependent type 2 diabetes mellitus ([place]) 04/10/2013   Sex counseling 04/10/2013   Edema leg 04/10/2013   Upper respiratory infection, viral 04/10/2013   Pedal edema 03/29/2013   Pneumonia 12/12/2012   Essential hypertension 12/12/2012   HLD (hyperlipidemia) 12/12/2012   Tonsillar mass 12/01/2012   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

## 2020-09-08 ENCOUNTER — Other Ambulatory Visit: Payer: Self-pay

## 2020-09-08 ENCOUNTER — Ambulatory Visit (HOSPITAL_BASED_OUTPATIENT_CLINIC_OR_DEPARTMENT_OTHER): Payer: Medicare Other | Admitting: Physical Therapy

## 2020-09-08 ENCOUNTER — Encounter (HOSPITAL_BASED_OUTPATIENT_CLINIC_OR_DEPARTMENT_OTHER): Payer: Self-pay | Admitting: Physical Therapy

## 2020-09-08 DIAGNOSIS — M6281 Muscle weakness (generalized): Secondary | ICD-10-CM

## 2020-09-08 DIAGNOSIS — R2681 Unsteadiness on feet: Secondary | ICD-10-CM

## 2020-09-08 DIAGNOSIS — R2689 Other abnormalities of gait and mobility: Secondary | ICD-10-CM

## 2020-09-08 NOTE — Therapy (Signed)
[facility] [address] [place], [place], [address] Phone: [phone]   Fax:  [phone]  Physical Therapy Treatment  Patient Details  Name: Kaitlin [name] MRN: [id number] Date of Birth: 1965-09-21 Referring Provider (PT): Courtney Heys, MD   Encounter Date: 09/08/2020   PT End of Session - 09/08/20 1031     Visit Number 21    Number of Visits 28    Date for PT Re-Evaluation 09/12/20    Authorization Type UHC Medicare    PT Start Time 1016    PT Stop Time 1055    PT Time Calculation (min) 39 min    Activity Tolerance Patient tolerated treatment well    Behavior During Therapy [facility] for tasks assessed/performed             Past Medical History:  Diagnosis Date   Anemia    Bronchitis    Cataracts, bilateral 01/12/2018   CHF (congestive heart failure) ([place])    Diabetes mellitus    High cholesterol    Hypertension    takes medicine to protect kidneys, does not have HTN   Leukocytosis    Morbid obesity ([place]) 09/11/2011   BMI 57   Neuropathy 01/12/2018   NICM (nonischemic cardiomyopathy) ([place]) 05/01/2013   Overview:  Last Assessment & Plan:  euvolemic on physical exam.    Obesity    Sickle cell trait ([place])    Thymoma     Past Surgical History:  Procedure Laterality Date   COLONOSCOPY N/A 06/21/2017   Procedure: COLONOSCOPY;  Surgeon: Ronnette Juniper, MD;  Location: [facility];  Service: Gastroenterology;  Laterality: N/A;   LEFT HEART CATHETERIZATION WITH CORONARY ANGIOGRAM N/A 05/25/2013   Procedure: LEFT HEART CATHETERIZATION WITH CORONARY ANGIOGRAM;  Surgeon: Troy Sine, MD;  Location: [facility] CATH LAB;  Service: Cardiovascular;  Laterality: N/A;   LYMPH NODE BIOPSY Right 11/30/2012   Procedure: RIGHT TONSIL BIOPSY WITH FRESH FROZEN ANALYSIS;  Surgeon: Jodi Marble, MD;  Location: [facility];  Service: ENT;  Laterality: Right;   MEDIASTERNOTOMY N/A 01/12/2013   Procedure: MEDIAN STERNOTOMY;  Surgeon: Gaye Pollack,  MD;  Location: [facility];  Service: Thoracic;  Laterality: N/A;   MEDIASTINOTOMY CHAMBERLAIN MCNEIL    Right 11/06/2012   Procedure: MEDIASTINOTOMY CHAMBERLAIN MCNEIL PROCEDURE;  Surgeon: Gaye Pollack, MD;  Location: [facility];  Service: Thoracic;  Laterality: Right;   POLYPECTOMY  06/21/2017   Procedure: POLYPECTOMY;  Surgeon: Ronnette Juniper, MD;  Location: [facility];  Service: Gastroenterology;;   RESECTION OF A THYMOMA N/A 01/12/2013   Procedure: RESECTION OF A THYMOMA;  Surgeon: Gaye Pollack, MD;  Location: [facility];  Service: Thoracic;  Laterality: N/A;   TONSILLECTOMY     TUBAL LIGATION      There were no vitals filed for this visit.   Subjective Assessment - 09/08/20 1029     Subjective Rt knee and shoulders (rubs deltoids) are just a little sore.                 AquaticREHABdocumentation: Pt is unable to tolerate land due to pain or inability to move freely on land without pain and substitution/compensations , Hydrostatic pressure also supports joints by unweighting joint load by at least 50 % in 3-4 feet depth water. 80% in chest to neck deep water., and Water will allow for reduced gait deviation due to reduced joint loading through buoyancy to help patient improve posture without excess stress and pain Walking fwd & retro &  lateral  Power walking 3 laps Hip circles holding side of pool Hip flexion/ext holding water dumbbells Heel raises- water dumbbells for balance support Feet wide & ER + UE horiz abd/add for core engagement Wide tandem with scissoring UEs Cool down walk with noodle                        PT Short Term Goals - 07/23/20 1248       PT SHORT TERM GOAL #1   Title Pt will be independent with initial HEP    Status Achieved      PT SHORT TERM GOAL #2   Title Pt will be ambulating 2 minutes at least 3x in the day for home walking program    Status Achieved      PT SHORT TERM GOAL #3   Title Pt will demo improved double leg press  strength with increased 1 RM to 220 lbs    Status Unable to assess               PT Long Term Goals - 08/25/20 1358       PT LONG TERM GOAL #1   Title Pt will be independent with continuing to self progress her exercises at home    Baseline requires further progression, being more mobile    Status On-going      PT LONG TERM GOAL #2   Title Pt will improve 2 MWT to at least 300' to demo improved walking endurance; revised 6MWT to improve by [place]    Baseline 246' on 06/25/20, 690 on 7/20 for 6MWT, 608 on 8/15, MDC 198.98, normative value for her age not available- value for 55 yo is 2'    Status On-going      PT LONG TERM GOAL #3   Title Pt will demo 5x STS of <13 sec to demo improved functional LE strength    Baseline 15s today    Status On-going      PT LONG TERM GOAL #4   Title pt will climb 12 stairs available in clinic- HR <100 bpm to decr need for rest break when she is going home    Baseline ascent took 22s HR to 117 bpm, resting HR 88 bpm    Status New                   Plan - 09/08/20 1036     Clinical Impression Statement power walking across pool dropped O2 & incr HR without much notable sensation of it from patient- 92% & 113 BPM after 3 laps. encouraged her to use her pulseox when exercising and to avoid pushing cardio if her HR is not below 100 after a few min of rest.    PT Treatment/Interventions ADLs/Self Care Home Management;DME Instruction;Gait training;Stair training;Functional mobility training;Therapeutic activities;Balance training;Therapeutic exercise;Neuromuscular re-education;Patient/family education;Energy conservation    PT Next Visit Plan d/c friday, monitor vitals    PT Home Exercise Plan [email or web address]    Consulted and Agree with Plan of Care Patient             Patient will benefit from skilled therapeutic intervention in order to improve the following deficits and impairments:  Abnormal gait, Cardiopulmonary status limiting activity,  Decreased activity tolerance, Decreased balance, Decreased coordination, Decreased endurance, Decreased mobility, Difficulty walking, Decreased strength, Impaired sensation, Postural dysfunction, Obesity, Impaired UE functional use, Decreased range of motion, Pain  Visit Diagnosis: Muscle weakness (generalized)  Unsteadiness on feet  Other abnormalities of gait and mobility     Problem List Patient Active Problem List   Diagnosis Date Noted   Long COVID 01/02/2020   Mixed stress and urge urinary incontinence 07/04/2019   Nerve pain 05/16/2019   Myofascial pain 05/16/2019   Subluxation of acromioclavicular joint, right, sequela 03/23/2019   Sickle cell trait ([place])    Labile blood glucose    Diabetic peripheral neuropathy (HCC)    Hypokalemia    Pain in joint of left shoulder    Debility    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   COVID-19    History of anemia    Super obese    Poorly controlled type 2 diabetes mellitus with peripheral neuropathy ([place])    Acute on chronic diastolic (congestive) heart failure (HCC)    Hypernatremia    Shock circulatory ([place])    Septic shock (HCC)    Hypotension 01/13/2019   Acute respiratory failure with hypoxemia ([place]) 01/13/2019   Acute respiratory distress syndrome (ARDS) due to COVID-19 virus ([place]) 01/12/2019   Grade I diastolic dysfunction 123XX123   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   Dyspnea on exertion 05/19/2016   Other fatigue 05/19/2016   Absolute anemia 04/15/2016   Left leg weakness 10/18/2014   Left leg paresthesias 10/18/2014   Acute left-sided weakness 10/18/2014   Encounter for central line placement 03/08/2014   Taking drug for chronic disease 03/08/2014   H/O insertion of insulin pump 02/18/2014   Long term current use of insulin ([place]) 02/18/2014   Insulin pump in place 02/18/2014   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   Abnormal ballistocardiogram 05/18/2013   Abnormal nuclear stress test 05/18/2013    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 Andilyn Bettcher PT, DPT 09/08/20 4:29 PM   [facility] [address] [place], [place], [address] Phone: [phone]   Fax:  [phone]  Name: CACEE FLOWERS MRN: [id number] Date of Birth: 01/16/1965

## 2020-09-10 ENCOUNTER — Ambulatory Visit (HOSPITAL_BASED_OUTPATIENT_CLINIC_OR_DEPARTMENT_OTHER): Payer: Medicare Other | Admitting: Physical Therapy

## 2020-09-10 ENCOUNTER — Encounter (HOSPITAL_BASED_OUTPATIENT_CLINIC_OR_DEPARTMENT_OTHER): Payer: Medicare Other | Admitting: Physical Therapy

## 2020-09-10 ENCOUNTER — Other Ambulatory Visit: Payer: Self-pay

## 2020-09-10 DIAGNOSIS — M6281 Muscle weakness (generalized): Secondary | ICD-10-CM

## 2020-09-10 DIAGNOSIS — M25612 Stiffness of left shoulder, not elsewhere classified: Secondary | ICD-10-CM

## 2020-09-10 DIAGNOSIS — R262 Difficulty in walking, not elsewhere classified: Secondary | ICD-10-CM

## 2020-09-10 DIAGNOSIS — M79602 Pain in left arm: Secondary | ICD-10-CM

## 2020-09-10 DIAGNOSIS — R41840 Attention and concentration deficit: Secondary | ICD-10-CM

## 2020-09-10 DIAGNOSIS — M79601 Pain in right arm: Secondary | ICD-10-CM

## 2020-09-10 DIAGNOSIS — R2681 Unsteadiness on feet: Secondary | ICD-10-CM

## 2020-09-10 DIAGNOSIS — G7281 Critical illness myopathy: Secondary | ICD-10-CM

## 2020-09-10 DIAGNOSIS — R2689 Other abnormalities of gait and mobility: Secondary | ICD-10-CM

## 2020-09-10 NOTE — Therapy (Signed)
[facility] [address] [place], [place], [address] Phone: [phone]   Fax:  [phone]  Physical Therapy Treatment  Patient Details  Name: Kaitlin [name] MRN: [id number] Date of Birth: 1965/05/23 Referring Provider (PT): Courtney Heys, MD   Encounter Date: 09/10/2020   PT End of Session - 09/10/20 1042     Visit Number 22    Number of Visits 28    Date for PT Re-Evaluation 09/12/20    Authorization Type UHC Medicare    PT Start Time 1036    PT Stop Time 1116    PT Time Calculation (min) 40 min    Equipment Utilized During Treatment Other (comment)    Activity Tolerance Patient tolerated treatment well    Behavior During Therapy [facility] for tasks assessed/performed             Past Medical History:  Diagnosis Date   Anemia    Bronchitis    Cataracts, bilateral 01/12/2018   CHF (congestive heart failure) ([place])    Diabetes mellitus    High cholesterol    Hypertension    takes medicine to protect kidneys, does not have HTN   Leukocytosis    Morbid obesity ([place]) 09/11/2011   BMI 57   Neuropathy 01/12/2018   NICM (nonischemic cardiomyopathy) ([place]) 05/01/2013   Overview:  Last Assessment & Plan:  euvolemic on physical exam.    Obesity    Sickle cell trait ([place])    Thymoma     Past Surgical History:  Procedure Laterality Date   COLONOSCOPY N/A 06/21/2017   Procedure: COLONOSCOPY;  Surgeon: Ronnette Juniper, MD;  Location: [facility];  Service: Gastroenterology;  Laterality: N/A;   LEFT HEART CATHETERIZATION WITH CORONARY ANGIOGRAM N/A 05/25/2013   Procedure: LEFT HEART CATHETERIZATION WITH CORONARY ANGIOGRAM;  Surgeon: Troy Sine, MD;  Location: [facility] CATH LAB;  Service: Cardiovascular;  Laterality: N/A;   LYMPH NODE BIOPSY Right 11/30/2012   Procedure: RIGHT TONSIL BIOPSY WITH FRESH FROZEN ANALYSIS;  Surgeon: Jodi Marble, MD;  Location: [facility];  Service: ENT;  Laterality: Right;   MEDIASTERNOTOMY N/A 01/12/2013    Procedure: MEDIAN STERNOTOMY;  Surgeon: Gaye Pollack, MD;  Location: [facility];  Service: Thoracic;  Laterality: N/A;   MEDIASTINOTOMY CHAMBERLAIN MCNEIL    Right 11/06/2012   Procedure: MEDIASTINOTOMY CHAMBERLAIN MCNEIL PROCEDURE;  Surgeon: Gaye Pollack, MD;  Location: [facility];  Service: Thoracic;  Laterality: Right;   POLYPECTOMY  06/21/2017   Procedure: POLYPECTOMY;  Surgeon: Ronnette Juniper, MD;  Location: [facility];  Service: Gastroenterology;;   RESECTION OF A THYMOMA N/A 01/12/2013   Procedure: RESECTION OF A THYMOMA;  Surgeon: Gaye Pollack, MD;  [facility];  Service: Thoracic;  Laterality: N/A;   TONSILLECTOMY     TUBAL LIGATION      There were no vitals filed for this visit.   Subjective Assessment - 09/10/20 1257     Subjective 'Pain is about the same"                                         PT Short Term Goals - 07/23/20 1248       PT SHORT TERM GOAL #1   Title Pt will be independent with initial HEP    Status Achieved      PT SHORT TERM GOAL #2   Title Pt will be ambulating 2  minutes at least 3x in the day for home walking program    Status Achieved      PT SHORT TERM GOAL #3   Title Pt will demo improved double leg press strength with increased 1 RM to 220 lbs    Status Unable to assess               PT Long Term Goals - 08/25/20 1358       PT LONG TERM GOAL #1   Title Pt will be independent with continuing to self progress her exercises at home    Baseline requires further progression, being more mobile    Status On-going      PT LONG TERM GOAL #2   Title Pt will improve 2 MWT to at least 300' to demo improved walking endurance; revised 6MWT to improve by [facility]    Baseline 246' on 06/25/20, 690 on 7/20 for 6MWT, 608 on 8/15, [place] 198.98, normative value for her age not available- value for 55 yo is 1'    Status On-going      PT LONG TERM GOAL #3   Title Pt will demo 5x STS of <13 sec to demo improved functional LE  strength    Baseline 15s today    Status On-going      PT LONG TERM GOAL #4   Title pt will climb 12 stairs available in clinic- HR <100 bpm to decr need for rest break when she is going home    Baseline ascent took 22s HR to 117 bpm, resting HR 88 bpm    Status New           Pt seen for aquatic therapy today.  Treatment took place in water 3.5-4.5 ft in depth at the [facility]. Temp of water was 92.  Pt entered/exited the pool via stairs step to pattern with bilat rail.     -Warm up: forward walking x 3 laps, backward walking x 2 laps, and side stepping x 4 laps without UE assist   -Seated: Knee flex/ext with ankle buoy 2 x 10 reps VC for increased speed. Stretching gastroc, hamstrings and Lb Flutter kicking sitting on edge of seat, VC for tight core 3 sets of 20     Standing Step ups on bottom step leading with each LE 2 x 10 reps.  Backward x 5 R/L VC for increased speed. Some discomfort in right knee with backward step up   Bilat ue support on blue noodle strengthening and balance challenges Hip Abd 2 x 10 rep Hip flex 2 x 10 reps Hip extension 2 x 10 Ai Chi position 13 modified UE support on  blue noodle x 5 reps. VC and demo for proper execution. Noted: pt completes with more accuracy, fewer LOB.   Fast water walking 6 widths for cardio-pulmonary benefit      Pt entered Jacuzzi upon completion of session (non-billed) for LB massage.     Pt requires buoyancy for support and to offload joints with strengthening exercises. Viscosity of the water is needed for resistance of strengthening; water current perturbations provides challenge to standing balance unsupported, requiring increased core activation.        Plan - 09/10/20 1102     Clinical Impression Statement Preparing pt for DC. Improved core strength and balance as evidenced by use of blue buoyancy noodle  from yellow with kick downs and AiChi balance challenges. O2 sats 93% with pulse @ 114  after 4 widths  of pool. Pt with mild SOB.  She is ready for pending DC. Will ensure indep with aquatic HEP next visit.    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, CHF, diabetes mellitus, HTN, neuropathy, obesity    PT Treatment/Interventions ADLs/Self Care Home Management;DME Instruction;Gait training;Stair training;Functional mobility training;Therapeutic activities;Balance training;Therapeutic exercise;Neuromuscular re-education;Patient/family education;Energy conservation    PT Home Exercise Plan [id number] added aquatic             Patient will benefit from skilled therapeutic intervention in order to improve the following deficits and impairments:  Abnormal gait, Cardiopulmonary status limiting activity, Decreased activity tolerance, Decreased balance, Decreased coordination, Decreased endurance, Decreased mobility, Difficulty walking, Decreased strength, Impaired sensation, Postural dysfunction, Obesity, Impaired UE functional use, Decreased range of motion, Pain  Visit Diagnosis: Muscle weakness (generalized)  Critical illness myopathy  Stiffness of left shoulder, not elsewhere classified  Unsteadiness on feet  Other abnormalities of gait and mobility  Difficulty in walking, not elsewhere classified  Pain in right arm  Attention and concentration deficit  Pain in left arm     Problem List Patient Active Problem List   Diagnosis Date Noted   Long COVID 01/02/2020   Mixed stress and urge urinary incontinence 07/04/2019   Nerve pain 05/16/2019   Myofascial pain 05/16/2019   Subluxation of acromioclavicular joint, right, sequela 03/23/2019   Sickle cell trait (HCC)    Labile blood glucose    Diabetic peripheral neuropathy (HCC)    Hypokalemia    Pain in joint of left shoulder    Debility    Intensive care (ICU) myopathy  02/09/2019   COVID-19    History of anemia    Super obese    Poorly controlled type 2 diabetes mellitus with peripheral neuropathy (HCC)    Acute on chronic diastolic (congestive) heart failure (HCC)    Hypernatremia    Shock circulatory (HCC)    Septic shock (HCC)    Hypotension 01/13/2019   Acute respiratory failure with hypoxemia (HCC) 01/13/2019   Acute respiratory distress syndrome (ARDS) due to COVID-19 virus (HCC) 01/12/2019   Grade I diastolic dysfunction 123XX123   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   Dyspnea on exertion 05/19/2016   Other fatigue 05/19/2016   Absolute anemia 04/15/2016   Left leg weakness 10/18/2014   Left leg paresthesias 10/18/2014   Acute left-sided weakness 10/18/2014   Encounter for central line placement 03/08/2014   Taking drug for chronic disease 03/08/2014   H/O insertion of insulin pump 02/18/2014   Long term current use of insulin ([place]) 02/18/2014   Insulin pump in place 02/18/2014   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   Abnormal ballistocardiogram 05/18/2013   Abnormal nuclear stress test 05/18/2013   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   NICM (nonischemic cardiomyopathy) ([place]) 05/01/2013   Insulin dependent type 2 diabetes mellitus ([place]) 04/10/2013   Sex counseling 04/10/2013   Edema leg 04/10/2013   Upper respiratory infection, viral 04/10/2013   Pedal edema 03/29/2013   Pneumonia 12/12/2012   Essential hypertension 12/12/2012   HLD (hyperlipidemia) 12/12/2012   Tonsillar mass 12/01/2012   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 [address] [place], [place],  [address] Phone: [phone]   Fax:  [phone]  Name: Kaitlin [name] MRN: [id number] Date of Birth: 04/23/1965

## 2020-09-12 ENCOUNTER — Ambulatory Visit (HOSPITAL_BASED_OUTPATIENT_CLINIC_OR_DEPARTMENT_OTHER): Payer: Medicare Other | Admitting: Physical Therapy

## 2020-09-12 ENCOUNTER — Encounter (HOSPITAL_BASED_OUTPATIENT_CLINIC_OR_DEPARTMENT_OTHER): Payer: Self-pay | Admitting: Physical Therapy

## 2020-09-12 ENCOUNTER — Other Ambulatory Visit: Payer: Self-pay

## 2020-09-12 ENCOUNTER — Ambulatory Visit (HOSPITAL_BASED_OUTPATIENT_CLINIC_OR_DEPARTMENT_OTHER): Payer: Medicare Other | Attending: Physical Medicine and Rehabilitation | Admitting: Physical Therapy

## 2020-09-12 DIAGNOSIS — R2681 Unsteadiness on feet: Secondary | ICD-10-CM | POA: Insufficient documentation

## 2020-09-12 DIAGNOSIS — G7281 Critical illness myopathy: Secondary | ICD-10-CM | POA: Insufficient documentation

## 2020-09-12 DIAGNOSIS — M25612 Stiffness of left shoulder, not elsewhere classified: Secondary | ICD-10-CM | POA: Insufficient documentation

## 2020-09-12 DIAGNOSIS — M6281 Muscle weakness (generalized): Secondary | ICD-10-CM | POA: Insufficient documentation

## 2020-09-12 DIAGNOSIS — R41840 Attention and concentration deficit: Secondary | ICD-10-CM | POA: Diagnosis present

## 2020-09-12 DIAGNOSIS — M79601 Pain in right arm: Secondary | ICD-10-CM | POA: Insufficient documentation

## 2020-09-12 DIAGNOSIS — R262 Difficulty in walking, not elsewhere classified: Secondary | ICD-10-CM | POA: Diagnosis present

## 2020-09-12 DIAGNOSIS — M79602 Pain in left arm: Secondary | ICD-10-CM | POA: Diagnosis present

## 2020-09-12 NOTE — Therapy (Signed)
[facility] [address] [place], [place], [address] Phone: [phone]   Fax:  [phone]  Physical Therapy Treatment  Patient Details  Name: Kaitlin [name] MRN: [id number] Date of Birth: January 06, 1966 Referring Provider (PT): Courtney Heys, MD   Encounter Date: 09/12/2020  Subjective "[place] on Sept 7"  Past Medical History:  Diagnosis Date   Anemia    Bronchitis    Cataracts, bilateral 01/12/2018   CHF (congestive heart failure) ([place])    Diabetes mellitus    High cholesterol    Hypertension    takes medicine to protect kidneys, does not have HTN   Leukocytosis    Morbid obesity ([place]) 09/11/2011   BMI 57   Neuropathy 01/12/2018   NICM (nonischemic cardiomyopathy) ([place]) 05/01/2013   Overview:  Last Assessment & Plan:  euvolemic on physical exam.    Obesity    Sickle cell trait ([place])    Thymoma     Past Surgical History:  Procedure Laterality Date   COLONOSCOPY N/A 06/21/2017   Procedure: COLONOSCOPY;  Surgeon: Ronnette Juniper, MD;  Location: [facility];  Service: Gastroenterology;  Laterality: N/A;   LEFT HEART CATHETERIZATION WITH CORONARY ANGIOGRAM N/A 05/25/2013   Procedure: LEFT HEART CATHETERIZATION WITH CORONARY ANGIOGRAM;  Surgeon: Troy Sine, MD;  Location: [facility];  Service: Cardiovascular;  Laterality: N/A;   LYMPH NODE BIOPSY Right 11/30/2012   Procedure: RIGHT TONSIL BIOPSY WITH FRESH FROZEN ANALYSIS;  Surgeon: Jodi Marble, MD;  Location: [facility];  Service: ENT;  Laterality: Right;   MEDIASTERNOTOMY N/A 01/12/2013   Procedure: MEDIAN STERNOTOMY;  Surgeon: Gaye Pollack, MD;  Location: [facility];  Service: Thoracic;  Laterality: N/A;   MEDIASTINOTOMY CHAMBERLAIN MCNEIL    Right 11/06/2012   Procedure: MEDIASTINOTOMY CHAMBERLAIN MCNEIL PROCEDURE;  Surgeon: Gaye Pollack, MD;  Location: [facility];  Service: Thoracic;  Laterality: Right;   POLYPECTOMY  06/21/2017   Procedure:  POLYPECTOMY;  Surgeon: Ronnette Juniper, MD;  Location: [facility];  Service: Gastroenterology;;   RESECTION OF A THYMOMA N/A 01/12/2013   Procedure: RESECTION OF A THYMOMA;  Surgeon: Gaye Pollack, MD;  Location: [facility];  Service: Thoracic;  Laterality: N/A;   TONSILLECTOMY     TUBAL LIGATION      There were no vitals filed for this visit.                                PT Short Term Goals - 07/23/20 1248       PT SHORT TERM GOAL #1   Title Pt will be independent with initial HEP    Status Achieved      PT SHORT TERM GOAL #2   Title Pt will be ambulating 2 minutes at least 3x in the day for home walking program    Status Achieved      PT SHORT TERM GOAL #3   Title Pt will demo improved double leg press strength with increased 1 RM to 220 lbs    Status Unable to assess               PT Long Term Goals - 09/16/20 1334       PT LONG TERM GOAL #1   Status Achieved            Pt seen for aquatic therapy today.  Treatment took place in water 3.5-4.5 ft in depth at the  [facility] pool. Temp of water was 92.  Pt entered/exited the pool via stairs step to pattern with bilat rail.    Led pt through [employer] as written for pt.    -Warm up: forward walking x 3 laps, backward walking x 2 laps, and side stepping x 4 laps without UE assist   -Seated: Knee flex/ext 2 x 10 reps VC for increased speed. Stretching gastroc, hamstrings and Lb Flutter kicking sitting on edge of seat, VC for tight core 3 sets of 20   Standing Step ups on bottom step leading with each LE 2 x 10 reps.  Backward x 5 R/L VC for increased speed. Some discomfort in right knee with backward step up   Bilat ue support on blue noodle strengthening and balance challenges Hip Abd 2 x 10 rep Hip flex 2 x 10 reps Hip extension 2 x 10 Ai Chi position 13 modified UE support on  blue noodle x 5 reps. VC and demo for proper execution.    Fast water walking 6 widths for  cardio-pulmonary benefit       Pt entered Jacuzzi upon completion of session (non-billed) for LB massage.     Pt requires buoyancy for support and to offload joints with strengthening exercises. Viscosity of the water is needed for resistance of strengthening; water current perturbations provides challenge to standing balance unsupported, requiring increased core activation.   Patient will benefit from skilled therapeutic intervention in order to improve the following deficits and impairments:  Abnormal gait, Cardiopulmonary status limiting activity, Decreased activity tolerance, Decreased balance, Decreased coordination, Decreased endurance, Decreased mobility, Difficulty walking, Decreased strength, Impaired sensation, Postural dysfunction, Obesity, Impaired UE functional use, Decreased range of motion, Pain    Clinical Assessment Pt given lamentated HEP. Pt guided through and completes given vc and demonstration to ensure indep. She is instructed on progress it as she is able. She VU.  Pt has demonstrated improvement in all ares of strength, flexability,endurance/aerobic capacity as well as decreased pain.  She has reached her max potential.  Visit Diagnosis: Muscle weakness (generalized)  Difficulty in walking, not elsewhere classified  Pain in right arm  Critical illness myopathy  Stiffness of left shoulder, not elsewhere classified  Attention and concentration deficit  Unsteadiness on feet  Pain in left arm     Problem List  PHYSICAL THERAPY DISCHARGE SUMMARY  Visits from Start of Care: 23  Current functional level related to goals / functional outcomes: Pt indep with all ADL's and functional mobility in home environment, limited community ambulation   Remaining deficits: Limited amb distance/toleration   Education / Equipment: Mechanisms of dysfunction, dysfunction management   Patient agrees to discharge. Patient goals were partially met. Patient is being  discharged due to maximized rehab potential.    Vedia Pereyra MPT 09/16/2020, 1:48 PM  [facility] [address] [place], [place], [address] Phone: [phone]   Fax:  [phone]  Name: Kaitlin [name] MRN: [id number] Date of Birth: 12-04-1965

## 2020-09-16 ENCOUNTER — Encounter (HOSPITAL_BASED_OUTPATIENT_CLINIC_OR_DEPARTMENT_OTHER): Payer: Medicare Other | Admitting: Physical Therapy

## 2020-10-24 ENCOUNTER — Encounter (HOSPITAL_COMMUNITY): Payer: Self-pay

## 2020-10-24 ENCOUNTER — Ambulatory Visit (HOSPITAL_COMMUNITY)
Admission: EM | Admit: 2020-10-24 | Discharge: 2020-10-24 | Disposition: A | Discharge status: Home / Self Care | Payer: Medicare Other

## 2020-10-24 DIAGNOSIS — S99922A Unspecified injury of left foot, initial encounter: Secondary | ICD-10-CM | POA: Diagnosis not present

## 2020-10-24 NOTE — Discharge Instructions (Signed)
You can apply neosporin or antibiotic ointment twice a day.   Change the bandage once or twice day.  If the bandage gets dirty or wet you can change it more frequently.   The toenail will grow out or fall off on its own.  You can trim the toenail as needed.   Return or go to the Emergency Department if symptoms worsen or do not improve in the next few days.

## 2020-10-24 NOTE — ED Triage Notes (Signed)
Pt presents with injury to third toe on left foot after hitting it against some furniture last night.

## 2020-10-24 NOTE — ED Provider Notes (Signed)
[facility]    CSN: [id number] Arrival date & time: 10/24/20  1818      History   Chief Complaint Chief Complaint  Patient presents with   Toe Injury    HPI Kaitlin [name] is a 55 y.o. female.   Patient here for evaluation of left middle toe injury.  Reports hitting it against some furniture or a toy last night.  Reports toenail has broken.  Denies any pain in toe or foot.  Has not taken any OTC medications or treatments.  Denies any fevers, chest pain, shortness of breath, N/V/D, numbness, tingling, weakness, abdominal pain, or headaches.    The history is provided by the patient.   Past Medical History:  Diagnosis Date   Anemia    Bronchitis    Cataracts, bilateral 01/12/2018   CHF (congestive heart failure) ([facility])    Diabetes mellitus    High cholesterol    Hypertension    takes medicine to protect kidneys, does not have HTN   Leukocytosis    Morbid obesity ([place]) 09/11/2011   BMI 57   Neuropathy 01/12/2018   NICM (nonischemic cardiomyopathy) ([place]) 05/01/2013   Overview:  Last Assessment & Plan:  euvolemic on physical exam.    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   COVID-19    History of anemia    Super obese    Poorly controlled type 2 diabetes mellitus with peripheral neuropathy ([place])    Acute on chronic diastolic (congestive) heart failure (H[facility])    Hypernatremia    Shock circulatory ([place])    Septic shock ([place])    Hypotension 01/13/2019   Acute respiratory failure with hypoxemia ([place])  01/13/2019   Acute respiratory distress syndrome (ARDS) due to COVID-19 virus ([place]) 01/12/2019   Grade I diastolic dysfunction 31/54/0086   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   Dyspnea on exertion 05/19/2016   Other fatigue 05/19/2016   Absolute anemia 04/15/2016   Left leg weakness 10/18/2014   Left leg paresthesias 10/18/2014   Acute left-sided weakness 10/18/2014   Encounter for central line placement 03/08/2014   Taking drug for chronic disease 03/08/2014   H/O insertion of insulin pump 02/18/2014   Long term current use of insulin ([place]) 02/18/2014   Insulin pump in place 02/18/2014   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   Abnormal ballistocardiogram 05/18/2013   Abnormal nuclear stress test 05/18/2013   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   NICM (nonischemic cardiomyopathy) ([place]) 05/01/2013   Insulin dependent type 2 diabetes mellitus ([place]) 04/10/2013   Sex counseling 04/10/2013   Edema leg 04/10/2013   Upper respiratory infection, viral 04/10/2013   Pedal edema 03/29/2013   Pneumonia 12/12/2012   Essential hypertension 12/12/2012   HLD (hyperlipidemia) 12/12/2012   Tonsillar mass 12/01/2012   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   Leukocytosis    Anemia    High cholesterol    Hypertension    Blurred vision 09/11/2011  Foot pain 09/11/2011   Breast pain 09/11/2011   Morbid obesity ([place]) 09/11/2011   Fungal infection of nail 09/11/2011   Avitaminosis D 09/11/2011    Past Surgical History:  Procedure Laterality Date   COLONOSCOPY N/A 06/21/2017   Procedure: COLONOSCOPY;  Surgeon: Ronnette Juniper, MD;  [facility];  Service: Gastroenterology;  Laterality: N/A;   LEFT HEART CATHETERIZATION WITH CORONARY ANGIOGRAM N/A 05/25/2013   Procedure: LEFT HEART CATHETERIZATION WITH CORONARY ANGIOGRAM;  Surgeon: Troy Sine, MD;  Location: [facility] CATH LAB;  Service: Cardiovascular;  Laterality: N/A;   LYMPH NODE BIOPSY Right 11/30/2012   Procedure: RIGHT  TONSIL BIOPSY WITH FRESH FROZEN ANALYSIS;  Surgeon: Jodi Marble, MD;  Location: [facility];  Service: ENT;  Laterality: Right;   MEDIASTERNOTOMY N/A 01/12/2013   Procedure: MEDIAN STERNOTOMY;  Surgeon: Gaye Pollack, MD;  Location: [facility];  Service: Thoracic;  Laterality: N/A;   MEDIASTINOTOMY CHAMBERLAIN MCNEIL    Right 11/06/2012   Procedure: MEDIASTINOTOMY CHAMBERLAIN MCNEIL PROCEDURE;  Surgeon: Gaye Pollack, MD;  Location: [facility];  Service: Thoracic;  Laterality: Right;   POLYPECTOMY  06/21/2017   Procedure: POLYPECTOMY;  Surgeon: Ronnette Juniper, MD;  Location: [facility];  Service: Gastroenterology;;   RESECTION OF A THYMOMA N/A 01/12/2013   Procedure: RESECTION OF A THYMOMA;  Surgeon: Gaye Pollack, MD;  Location: [facility];  Service: Thoracic;  Laterality: N/A;   TONSILLECTOMY     TUBAL LIGATION      OB History   No obstetric history on file.      Home Medications    Prior to Admission medications   Medication Sig Start Date End Date Taking? Authorizing Provider  acetaminophen (TYLENOL) 160 MG/5ML solution Take 20.3 mLs (650 mg total) by mouth every 6 (six) hours as needed for mild pain or moderate pain. 03/09/19   Angiulli, Lavon Paganini, PA-C  albuterol (VENTOLIN HFA) 108 (90 Base) MCG/ACT inhaler One puff every 6 hour as needed 03/09/19   Angiulli, Lavon Paganini, PA-C  ascorbic acid (VITAMIN C) 500 MG tablet Take 1,500 mg by mouth daily.    [provider]  aspirin EC 81 MG tablet Take 1 tablet (81 mg total) by mouth daily. 05/18/16   Minus Breeding, MD  calcium-vitamin D (OSCAL WITH D) 500-200 MG-UNIT tablet Take 2 tablets by mouth daily with breakfast. 03/09/19   Angiulli, Lavon Paganini, PA-C  carvedilol (COREG) 6.25 MG tablet Take 1 tablet (6.25 mg total) by mouth 2 (two) times daily with a meal. 03/09/19   Angiulli, Lavon Paganini, PA-C  Cholecalciferol (VITAMIN D-3) 125 MCG (5000 UT) TABS Take 5,000 Units by mouth daily. 03/09/19   Angiulli, Lavon Paganini, PA-C  Continuous Blood Gluc Receiver (DEXCOM G6  RECEIVER) DEVI Use as directed for continuous glucose monitoring. 04/25/20   [provider]  Continuous Blood Gluc Sensor (DEXCOM G6 SENSOR) MISC Inject 1 sensor to the skin every 10 days for continuous glucose monitoring. 04/25/20   [provider]  Continuous Blood Gluc Transmit (DEXCOM G6 TRANSMITTER) MISC Use as directed for continuous glucose monitoring. Reuse transmitter for 90 days then discard and replace. 04/25/20   [provider]  diclofenac (VOLTAREN) 50 MG EC tablet TAKE 1 TABLET(50 MG) BY MOUTH DAILY- for arthritis 03/21/20   Lovorn, Jinny Blossom, MD  diclofenac Sodium (VOLTAREN) 1 % GEL Apply topically. 03/09/19   [provider]  FEROSUL 325 (65 Fe) MG tablet TAKE 1 TABLET(325 MG) BY MOUTH DAILY WITH BREAKFAST 08/05/20   Courtney Heys, MD  fexofenadine (ALLEGRA) 180 MG tablet Take 180 mg by mouth daily.    [provider]  glucose blood (CONTOUR NEXT TEST) test strip Use to test glucose 4-6 times per day. 05/15/20 05/15/21  [provider]  Insulin Human (INSULIN PUMP) SOLN Inject into the skin. Novolog    [provider]  insulin lispro (HUMALOG) 100 UNIT/ML injection USE UP TO 200 UNITS DAILY VIA INSULIN PUMP 06/02/20   [provider]  ketoconazole (NIZORAL) 2 % cream Apply 1 application topically daily. 05/02/19   [provider]  lidocaine (LIDODERM) 5 % Place 1 patch onto the skin daily. Remove & Discard patch within 12 hours or as directed by MD 03/09/19   Angiulli, Lavon Paganini, PA-C  lisinopril (ZESTRIL) 5 MG tablet Take 5 mg by mouth daily.    [provider]  magnesium oxide (MAG-OX) 400 (241.3 Mg) MG tablet Take 1 tablet (400 mg total) by mouth 2 (two) times daily. Patient taking differently: Take 400 mg by mouth daily. 03/09/19   Angiulli, Lavon Paganini, PA-C  methocarbamol (ROBAXIN) 500 MG tablet Take 1 tablet (500 mg total) by mouth every 8 (eight) hours as needed for muscle spasms. 08/22/20   Lovorn, Jinny Blossom, MD   Multiple Vitamin (MULTIVITAMIN WITH MINERALS) TABS tablet Take 1 tablet by mouth daily.    [provider]  Multiple Vitamins-Minerals (PRESERVISION AREDS) TABS Take 1 tablet by mouth 2 (two) times daily.    [provider]  Nirmatrelvir & Ritonavir 20 x 150 MG & 10 x 100MG  TBPK  06/06/20   [provider]  NOVOLOG 100 UNIT/ML injection Inject into the skin. 09/19/19   [provider]  potassium chloride SA (KLOR-CON) 20 MEQ tablet Take 20 mEq by mouth 2 (two) times daily.    [provider]  rosuvastatin (CRESTOR) 40 MG tablet Take 1 tablet (40 mg total) by mouth daily. 03/09/19   Angiulli, Lavon Paganini, PA-C  Semaglutide,0.25 or 0.5MG /DOS, (OZEMPIC, 0.25 OR 0.5 MG/DOSE,) 2 MG/1.5ML SOPN Inject 0.25 mg into the skin every 7 (seven) days.    [provider]  tiZANidine (ZANAFLEX) 2 MG tablet Take 2 mg by mouth 3 (three) times daily. 04/14/20   [provider]  torsemide (DEMADEX) 20 MG tablet Take 1 tablet (20 mg total) by mouth 2 (two) times daily. 03/09/19   Angiulli, Lavon Paganini, PA-C  triamcinolone acetonide (TRIESENCE) 40 MG/ML SUSP Inject into the articular space. 03/10/20   [provider]    Family History Family History  Problem Relation Age of Onset   Heart disease Father        No details   Diabetes type II Other        Family HX   Breast cancer Other    Breast cancer Maternal Grandmother     Social History Social History   Tobacco Use   Smoking status: Never    Passive exposure: Yes   Smokeless tobacco: Never  Vaping Use   Vaping Use: Never used  Substance Use Topics   Alcohol use: No   Drug use: No     Allergies   Ibuprofen and Amoxicillin-pot clavulanate   Review of Systems Review of Systems  Skin:  Positive for wound.  All other systems reviewed and are negative.   Physical Exam Triage Vital Signs ED Triage Vitals  Enc Vitals Group     BP 10/24/20 1854 140/81     Pulse Rate 10/24/20 1854 90      Resp 10/24/20  1854 20     Temp 10/24/20 1854 98.6 F (37 C)     Temp Source 10/24/20 1854 Oral     SpO2 10/24/20 1854 95 %     Weight --      Height --      Head Circumference --      Peak Flow --      Pain Score 10/24/20 1857 6     Pain Loc --      Pain Edu? --      Excl. in [place]? --    No data found.  Updated Vital Signs BP 140/81 (BP Location: Left Arm)   Pulse 90   Temp 98.6 F (37 C) (Oral)   Resp 20   LMP 06/17/2012   SpO2 95%   Visual Acuity Right Eye Distance:   Left Eye Distance:   Bilateral Distance:    Right Eye Near:   Left Eye Near:    Bilateral Near:     Physical Exam Vitals and nursing note reviewed.  Constitutional:      General: She is not in acute distress.    Appearance: Normal appearance. She is not ill-appearing, toxic-appearing or diaphoretic.  HENT:     Head: Normocephalic and atraumatic.  Eyes:     Conjunctiva/sclera: Conjunctivae normal.  Cardiovascular:     Rate and Rhythm: Normal rate.     Pulses: Normal pulses.  Pulmonary:     Effort: Pulmonary effort is normal.  Abdominal:     General: Abdomen is flat.  Musculoskeletal:        General: Normal range of motion.     Cervical back: Normal range of motion.  Feet:     Comments: Left middle toenail cracked with hematoma beneath nail, no active bleeding at this time.  See photo below Skin:    General: Skin is warm and dry.  Neurological:     General: No focal deficit present.     Mental Status: She is alert and oriented to person, place, and time.  Psychiatric:        Mood and Affect: Mood normal.      UC Treatments / Results  Labs (all labs ordered are listed, but only abnormal results are displayed) Labs Reviewed - No data to display  EKG   Radiology No results found.  Procedures Procedures (including critical care time)  Medications Ordered in UC Medications - No data to display  Initial Impression / Assessment and Plan / UC Course  I have reviewed the  triage vital signs and the nursing notes.  Pertinent labs & imaging results that were available during my care of the patient were reviewed by me and considered in my medical decision making (see chart for details).    Left middle toe injury.  Discussed with patient that toenail can be removed in office but that nail will also grow out and fall out on its own.  Patient elected to not remove nail in office.  May apply Neosporin or antibiotic ointment twice a day as needed.  Apply bandage to the toe once or twice a day or as needed.  Discussed trimming toenails as needed.  Follow-up for reevaluation as needed Final Clinical Impressions(s) / UC Diagnoses   Final diagnoses:  Injury of toe on left foot, initial encounter     Discharge Instructions      You can apply neosporin or antibiotic ointment twice a day.   Change the bandage once or twice day.  If the  bandage gets dirty or wet you can change it more frequently.   The toenail will grow out or fall off on its own.  You can trim the toenail as needed.   Return or go to the Emergency Department if symptoms worsen or do not improve in the next few days.      ED Prescriptions   None    PDMP not reviewed this encounter.   Pearson Forster, NP 10/24/20 1921

## 2020-11-15 ENCOUNTER — Ambulatory Visit (HOSPITAL_COMMUNITY)
Admission: EM | Admit: 2020-11-15 | Discharge: 2020-11-15 | Disposition: A | Discharge status: Home / Self Care | Payer: Medicare Other | Attending: Physician Assistant | Admitting: Physician Assistant

## 2020-11-15 ENCOUNTER — Encounter (HOSPITAL_COMMUNITY): Payer: Self-pay | Admitting: Emergency Medicine

## 2020-11-15 ENCOUNTER — Other Ambulatory Visit: Payer: Self-pay

## 2020-11-15 DIAGNOSIS — R051 Acute cough: Secondary | ICD-10-CM

## 2020-11-15 DIAGNOSIS — R52 Pain, unspecified: Secondary | ICD-10-CM

## 2020-11-15 DIAGNOSIS — J101 Influenza due to other identified influenza virus with other respiratory manifestations: Secondary | ICD-10-CM

## 2020-11-15 LAB — POC INFLUENZA A AND B ANTIGEN (URGENT CARE ONLY)
INFLUENZA A ANTIGEN, POC: POSITIVE — AB
INFLUENZA B ANTIGEN, POC: NEGATIVE

## 2020-11-15 MED ORDER — OSELTAMIVIR PHOSPHATE 75 MG PO CAPS: 75.0000 mg | ORAL_CAPSULE | Freq: Two times a day (BID) | ORAL | 0 refills | Status: DC

## 2020-11-15 NOTE — ED Provider Notes (Signed)
[place]    CSN: [id number] Arrival date & time: 11/15/20  1444      History   Chief Complaint Chief Complaint  Patient presents with   Cough    HPI Kaitlin [name] is a 55 y.o. female.   Patient presents today with a 1 day history of URI symptoms.  Reports cough, rhinorrhea, nausea, body aches, headache.  Denies any chest pain, shortness of breath, vomiting, abdominal pain.  She does report her grandson has been sick she believes he was diagnosed with the flu.  She has had her flu shot.  She has a history of COVID and last episode was approximately 3 to 4 months ago.  She is up-to-date on COVID-19 vaccines with the exception of the most recent booster.  She has not tried any over-the-counter medication for symptom management.  She does have a history of allergies and has been taking allergy medication as prescribed.  Reports she was recently treated with azithromycin approximately 2 weeks ago but denies additional antibiotic use.  Denies any history of asthma or COPD; does have an albuterol inhaler for when she gets sick but has not used this medication recently.   Past Medical History:  Diagnosis Date   Anemia    Bronchitis    Cataracts, bilateral 01/12/2018   CHF (congestive heart failure) (HCC)    Diabetes mellitus    High cholesterol    Hypertension    takes medicine to protect kidneys, does not have HTN   Leukocytosis    Morbid obesity ([place]) 09/11/2011   BMI 57   Neuropathy 01/12/2018   NICM (nonischemic cardiomyopathy) ([place]) 05/01/2013   Overview:  Last Assessment & Plan:  euvolemic on physical exam.    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[place])    Acute on chronic diastolic (congestive) heart failure ([place])    Hypernatremia    Shock circulatory ([place])    Septic shock ([place])    Hypotension 01/13/2019   Acute respiratory failure with hypoxemia ([place]) 01/13/2019   Acute respiratory distress syndrome (ARDS) due to COVID-19 virus ([place]) 01/12/2019   Grade I diastolic dysfunction 74/12/8784   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   Dyspnea on exertion 05/19/2016   Other fatigue 05/19/2016   Absolute anemia 04/15/2016   Left leg weakness 10/18/2014   Left leg paresthesias 10/18/2014   Acute left-sided weakness 10/18/2014   Encounter for central line placement 03/08/2014   Taking drug for chronic disease 03/08/2014   H/O insertion of insulin pump 02/18/2014   Long term current use of insulin ([place]) 02/18/2014   Insulin pump in place 02/18/2014   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   Abnormal ballistocardiogram 05/18/2013   Abnormal nuclear stress test 05/18/2013   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   NICM (nonischemic cardiomyopathy) ([place]) 05/01/2013  Insulin dependent type 2 diabetes mellitus ([place]) 04/10/2013   Sex counseling 04/10/2013   Edema leg 04/10/2013   Upper respiratory infection, viral 04/10/2013   Pedal edema 03/29/2013   Pneumonia 12/12/2012   Essential hypertension 12/12/2012   HLD (hyperlipidemia) 12/12/2012   Tonsillar mass 12/01/2012   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   Leukocytosis    Anemia    High cholesterol    Hypertension    Blurred vision 09/11/2011   Foot pain 09/11/2011   Breast pain 09/11/2011   Morbid obesity ([place]) 09/11/2011   Fungal infection of nail 09/11/2011   Avitaminosis D 09/11/2011     Past Surgical History:  Procedure Laterality Date   COLONOSCOPY N/A 06/21/2017   Procedure: COLONOSCOPY;  Surgeon: Ronnette Juniper, MD;  Location: [facility];  Service: Gastroenterology;  Laterality: N/A;   LEFT HEART CATHETERIZATION WITH CORONARY ANGIOGRAM N/A 05/25/2013   Procedure: LEFT HEART CATHETERIZATION WITH CORONARY ANGIOGRAM;  Surgeon: Troy Sine, MD;  Location: [facility];  Service: Cardiovascular;  Laterality: N/A;   LYMPH NODE BIOPSY Right 11/30/2012   Procedure: RIGHT TONSIL BIOPSY WITH FRESH FROZEN ANALYSIS;  Surgeon: Jodi Marble, MD;  Location: [facility];  Service: ENT;  Laterality: Right;   MEDIASTERNOTOMY N/A 01/12/2013   Procedure: MEDIAN STERNOTOMY;  Surgeon: Gaye Pollack, MD;  Location: [facility];  Service: Thoracic;  Laterality: N/A;   MEDIASTINOTOMY CHAMBERLAIN MCNEIL    Right 11/06/2012   Procedure: MEDIASTINOTOMY CHAMBERLAIN MCNEIL PROCEDURE;  Surgeon: Gaye Pollack, MD;  Location: [facility];  Service: Thoracic;  Laterality: Right;   POLYPECTOMY  06/21/2017   Procedure: POLYPECTOMY;  Surgeon: Ronnette Juniper, MD;  Location: [facility];  Service: Gastroenterology;;   RESECTION OF A THYMOMA N/A 01/12/2013   Procedure: RESECTION OF A THYMOMA;  Surgeon: Gaye Pollack, MD;  Location: [facility];  Service: Thoracic;  Laterality: N/A;   TONSILLECTOMY     TUBAL LIGATION      OB History   No obstetric history on file.      Home Medications    Prior to Admission medications   Medication Sig Start Date End Date Taking? Authorizing Provider  ascorbic acid (VITAMIN C) 500 MG tablet Take 1,500 mg by mouth daily.   Yes [provider]  calcium-vitamin D (OSCAL WITH D) 500-200 MG-UNIT tablet Take 2 tablets by mouth daily with breakfast. 03/09/19  Yes Angiulli, Lavon Paganini, PA-C  carvedilol (COREG) 6.25 MG tablet Take 1 tablet (6.25 mg total) by mouth 2 (two) times daily with a meal. 03/09/19  Yes Angiulli, Lavon Paganini, PA-C  Cholecalciferol (VITAMIN D-3) 125 MCG (5000 UT) TABS Take  5,000 Units by mouth daily. 03/09/19  Yes Angiulli, Lavon Paganini, PA-C  FEROSUL 325 (65 Fe) MG tablet TAKE 1 TABLET(325 MG) BY MOUTH DAILY WITH BREAKFAST 08/05/20  Yes Lovorn, Jinny Blossom, MD  fexofenadine (ALLEGRA) 180 MG tablet Take 180 mg by mouth daily.   Yes [provider]  Insulin Human (INSULIN PUMP) SOLN Inject into the skin. Novolog   Yes [provider]  insulin lispro (HUMALOG) 100 UNIT/ML injection USE UP TO 200 UNITS DAILY VIA INSULIN PUMP 06/02/20  Yes [provider]  lisinopril (ZESTRIL) 5 MG tablet Take 5 mg by mouth daily.   Yes [provider]  magnesium oxide (MAG-OX) 400 (241.3 Mg) MG tablet Take 1 tablet (400 mg total) by mouth 2 (two) times daily. Patient taking differently: Take 400 mg by mouth daily. 03/09/19  Yes Swink, Lavon Paganini,  PA-C  Multiple Vitamin (MULTIVITAMIN WITH MINERALS) TABS tablet Take 1 tablet by mouth daily.   Yes [provider]  Multiple Vitamins-Minerals (PRESERVISION AREDS) TABS Take 1 tablet by mouth 2 (two) times daily.   Yes [provider]  NOVOLOG 100 UNIT/ML injection Inject into the skin. 09/19/19  Yes [provider]  oseltamivir (TAMIFLU) 75 MG capsule Take 1 capsule (75 mg total) by mouth every 12 (twelve) hours. 11/15/20  Yes Disaya Walt K, PA-C  potassium chloride SA (KLOR-CON) 20 MEQ tablet Take 20 mEq by mouth 2 (two) times daily.   Yes [provider]  rosuvastatin (CRESTOR) 40 MG tablet Take 1 tablet (40 mg total) by mouth daily. 03/09/19  Yes Angiulli, Lavon Paganini, PA-C  torsemide (DEMADEX) 20 MG tablet Take 1 tablet (20 mg total) by mouth 2 (two) times daily. 03/09/19  Yes Angiulli, Lavon Paganini, PA-C  acetaminophen (TYLENOL) 160 MG/5ML solution Take 20.3 mLs (650 mg total) by mouth every 6 (six) hours as needed for mild pain or moderate pain. 03/09/19   Angiulli, Lavon Paganini, PA-C  albuterol (VENTOLIN HFA) 108 (90 Base) MCG/ACT inhaler One puff every 6 hour as needed 03/09/19   Angiulli,  Lavon Paganini, PA-C  aspirin EC 81 MG tablet Take 1 tablet (81 mg total) by mouth daily. 05/18/16   Minus Breeding, MD  Continuous Blood Gluc Receiver (DEXCOM G6 RECEIVER) DEVI Use as directed for continuous glucose monitoring. 04/25/20   [provider]  Continuous Blood Gluc Sensor (DEXCOM G6 SENSOR) MISC Inject 1 sensor to the skin every 10 days for continuous glucose monitoring. 04/25/20   [provider]  Continuous Blood Gluc Transmit (DEXCOM G6 TRANSMITTER) MISC Use as directed for continuous glucose monitoring. Reuse transmitter for 90 days then discard and replace. 04/25/20   [provider]  diclofenac (VOLTAREN) 50 MG EC tablet TAKE 1 TABLET(50 MG) BY MOUTH DAILY- for arthritis 03/21/20   Lovorn, Jinny Blossom, MD  diclofenac Sodium (VOLTAREN) 1 % GEL Apply topically. 03/09/19   [provider]  glucose blood (CONTOUR NEXT TEST) test strip Use to test glucose 4-6 times per day. 05/15/20 05/15/21  [provider]  ketoconazole (NIZORAL) 2 % cream Apply 1 application topically daily. 05/02/19   [provider]  lidocaine (LIDODERM) 5 % Place 1 patch onto the skin daily. Remove & Discard patch within 12 hours or as directed by MD 03/09/19   Angiulli, Lavon Paganini, PA-C  methocarbamol (ROBAXIN) 500 MG tablet Take 1 tablet (500 mg total) by mouth every 8 (eight) hours as needed for muscle spasms. Patient not taking: Reported on 11/15/2020 08/22/20   Courtney Heys, MD  Nirmatrelvir & Ritonavir 20 x 150 MG & 10 x 100MG  TBPK  06/06/20   [provider]  Semaglutide,0.25 or 0.5MG /DOS, (OZEMPIC, 0.25 OR 0.5 MG/DOSE,) 2 MG/1.5ML SOPN Inject 0.25 mg into the skin every 7 (seven) days.    [provider]  tiZANidine (ZANAFLEX) 2 MG tablet Take 2 mg by mouth 3 (three) times daily. Patient not taking: Reported on 11/15/2020 04/14/20   [provider]  triamcinolone acetonide (TRIESENCE) 40 MG/ML SUSP Inject into the articular space. 03/10/20   [provider]    Family History Family History  Problem Relation Age of Onset   Heart disease Father        No details   Breast cancer Maternal Grandmother    Diabetes type II Other        Family HX   Breast cancer  Other     Social History Social History   Tobacco Use   Smoking status: Never    Passive exposure: Yes   Smokeless tobacco: Never  Vaping Use   Vaping Use: Never used  Substance Use Topics   Alcohol use: No   Drug use: No     Allergies   Ibuprofen and Amoxicillin-pot clavulanate   Review of Systems Review of Systems  Constitutional:  Positive for activity change, appetite change and fatigue. Negative for fever.  HENT:  Positive for congestion. Negative for sinus pressure, sneezing and sore throat.   Respiratory:  Positive for cough. Negative for shortness of breath.   Cardiovascular:  Negative for chest pain.  Gastrointestinal:  Positive for diarrhea and nausea. Negative for abdominal pain and vomiting.  Musculoskeletal:  Positive for arthralgias and myalgias.  Neurological:  Positive for headaches. Negative for dizziness and light-headedness.    Physical Exam Triage Vital Signs ED Triage Vitals  Enc Vitals Group     BP 11/15/20 1621 134/84     Pulse Rate 11/15/20 1621 (!) 105     Resp 11/15/20 1621 (!) 22     Temp 11/15/20 1621 99.2 F (37.3 C)     Temp Source 11/15/20 1621 Oral     SpO2 11/15/20 1621 98 %     Weight --      Height --      Head Circumference --      Peak Flow --      Pain Score 11/15/20 1616 6     Pain Loc --      Pain Edu? --      Excl. in [place]? --    No data found.  Updated Vital Signs BP 134/84 (BP Location: Right Arm)   Pulse (!) 105   Temp 99.2 F (37.3 C) (Oral)   Resp (!) 22   LMP 06/17/2012   SpO2 98%   Visual Acuity Right Eye Distance:   Left Eye Distance:   Bilateral Distance:    Right Eye Near:   Left Eye Near:    Bilateral Near:     Physical Exam Vitals reviewed.  Constitutional:       General: She is awake. She is not in acute distress.    Appearance: Normal appearance. She is well-developed. She is not ill-appearing.     Comments: Very pleasant female appears stated age sitting comfortably in exam room in no acute distress  HENT:     Head: Normocephalic and atraumatic.     Right Ear: Tympanic membrane, ear canal and external ear normal. Tympanic membrane is not erythematous or bulging.     Left Ear: Tympanic membrane, ear canal and external ear normal. Tympanic membrane is not erythematous or bulging.     Nose:     Right Sinus: No maxillary sinus tenderness or frontal sinus tenderness.     Left Sinus: No maxillary sinus tenderness or frontal sinus tenderness.     Mouth/Throat:     Pharynx: Uvula midline. No oropharyngeal exudate or posterior oropharyngeal erythema.  Cardiovascular:     Rate and Rhythm: Normal rate and regular rhythm.     Heart sounds: Normal heart sounds, S1 normal and S2 normal. No murmur heard. Pulmonary:     Effort: Pulmonary effort is normal.     Breath sounds: Normal breath sounds. No wheezing, rhonchi or rales.     Comments: Clear to auscultation bilaterally Abdominal:     General: Bowel sounds are normal.  Palpations: Abdomen is soft.     Tenderness: There is no abdominal tenderness.  Psychiatric:        Behavior: Behavior is cooperative.     UC Treatments / Results  Labs (all labs ordered are listed, but only abnormal results are displayed) Labs Reviewed  POC INFLUENZA A AND B ANTIGEN (URGENT CARE ONLY) - Abnormal; Notable for the following components:      Result Value   INFLUENZA A ANTIGEN, POC POSITIVE (*)    All other components within normal limits    EKG   Radiology No results found.  Procedures Procedures (including critical care time)  Medications Ordered in UC Medications - No data to display  Initial Impression / Assessment and Plan / UC Course  I have reviewed the triage vital signs and the nursing  notes.  Pertinent labs & imaging results that were available during my care of the patient were reviewed by me and considered in my medical decision making (see chart for details).     Patient has positive for influenza A.  Given risk factors she was started on Tamiflu 75 mg twice daily for 5 days.  Recommended she use over-the-counter medications including Tylenol, Mucinex, Flonase for symptom relief.  She is to rest and drink plenty of fluid.  Discussed alarm symptoms that warrant emergent evaluation.  Strict return precautions given to which she expressed understanding.  Recommended follow-up with primary care provider next week if symptoms have not resolved.  Final Clinical Impressions(s) / UC Diagnoses   Final diagnoses:  Influenza A  Acute cough  Body aches     Discharge Instructions      You tested positive for flu.  Take Tamiflu twice daily for 5 days.  This can upset your stomach.  Use Tylenol, Mucinex, Flonase for additional symptom relief.  Make sure you are drinking plenty of fluid.  If you have any worsening symptoms you need to be reevaluated in the emergency room.  Follow-up with your primary care provider soon as possible.     ED Prescriptions     Medication Sig Dispense Auth. Provider   oseltamivir (TAMIFLU) 75 MG capsule Take 1 capsule (75 mg total) by mouth every 12 (twelve) hours. 10 capsule Asaiah Scarber, Derry Skill, PA-C      PDMP not reviewed this encounter.   Terrilee Croak, PA-C 11/15/20 1717

## 2020-11-15 NOTE — ED Triage Notes (Signed)
Onset this morning of cough, runny nose, minor nausea, minimal body aches, and headache.

## 2020-11-15 NOTE — Discharge Instructions (Addendum)
You tested positive for flu.  Take Tamiflu twice daily for 5 days.  This can upset your stomach.  Use Tylenol, Mucinex, Flonase for additional symptom relief.  Make sure you are drinking plenty of fluid.  If you have any worsening symptoms you need to be reevaluated in the emergency room.  Follow-up with your primary care provider soon as possible.

## 2020-11-15 NOTE — ED Notes (Signed)
Reports grandson has the flu

## 2020-11-15 NOTE — ED Notes (Signed)
Labeled flu swab in lab

## 2020-11-21 ENCOUNTER — Ambulatory Visit: Payer: Medicare Other | Admitting: Physical Medicine and Rehabilitation

## 2020-12-03 ENCOUNTER — Other Ambulatory Visit: Payer: Self-pay | Admitting: Physical Medicine and Rehabilitation

## 2020-12-03 NOTE — Telephone Encounter (Signed)
Pharmacy sending request for Ferrous Sulfate. Would you like to refill?

## 2021-02-06 ENCOUNTER — Encounter: Payer: Self-pay | Admitting: Physical Medicine and Rehabilitation

## 2021-02-06 ENCOUNTER — Encounter
Payer: Medicare Other | Attending: Physical Medicine and Rehabilitation | Admitting: Physical Medicine and Rehabilitation

## 2021-02-06 ENCOUNTER — Other Ambulatory Visit: Payer: Self-pay

## 2021-02-06 VITALS — BP 101/69 | HR 98 | Ht 69.0 in | Wt 394.0 lb

## 2021-02-06 DIAGNOSIS — M7918 Myalgia, other site: Secondary | ICD-10-CM | POA: Diagnosis present

## 2021-02-06 DIAGNOSIS — N3946 Mixed incontinence: Secondary | ICD-10-CM | POA: Diagnosis present

## 2021-02-06 DIAGNOSIS — U099 Post covid-19 condition, unspecified: Secondary | ICD-10-CM | POA: Insufficient documentation

## 2021-02-06 NOTE — Patient Instructions (Signed)
Plan: Patient here for trigger point injections for  Consent done and on chart.  Cleaned areas with alcohol and injected using a 27 gauge 1.5 inch needle  Injected 3cc Using 1% Lidocaine with no EPI  Upper traps B/L  Levators B/L - R side x2 Posterior scalenes R only x2 Middle scalenes- B/L  Splenius Capitus Pectoralis Major Rhomboids Infraspinatus Teres Major/minor Thoracic paraspinals Lumbar paraspinals Other injections-   Patient's level of pain prior was Current level of pain after injections is  There was no bleeding or complications.  Patient was advised to drink a lot of water on day after injections to flush system Will have increased soreness for 12-48 hours after injections.  Can use Lidocaine patches the day AFTER injections Can use theracane on day of injections in places didn't inject Can use heating pad 4-6 hours AFTER injections  2 Has to be a year to get to go back to therapy-   3. Getting incontinence supplies going well-   4. Call them and ask for pads for bed and then can add to Rx.    5. F/U in 3 months- for TrP injections

## 2021-02-06 NOTE — Progress Notes (Signed)
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 Still has severe difficulty with endurance due to ICU myopathy.     Here for f/u on Long COVID and side effects including incontinence and impaired gait or impaired core strength and endurance.    Here for trigger point injections.  Feeling bad- likely a sinus infection- sinus pressure and pain.    Exam: Awake, alert, but vague- a little dazed, NAD Sinus TTP with tapping- esp over frontal and maxillary sinuses-    Plan: Patient here for trigger point injections for  Consent done and on chart.  Cleaned areas with alcohol and injected using a 27 gauge 1.5 inch needle  Injected 3cc Using 1% Lidocaine with no EPI  Upper traps B/L  Levators B/L - R side x2 Posterior scalenes R only x2 Middle scalenes- B/L  Splenius Capitus Pectoralis Major Rhomboids Infraspinatus Teres Major/minor Thoracic paraspinals Lumbar paraspinals Other injections-   Patient's level of pain prior was Current level of pain after injections is  There was no bleeding or complications.  Patient was advised to drink a lot of water on day after injections to flush system Will have increased soreness for 12-48 hours after injections.  Can use Lidocaine patches the day AFTER injections Can use theracane on day of injections in places didn't inject Can use heating pad 4-6 hours AFTER injections  2 Has to be a year to get to go back to therapy-   3. Getting incontinence supplies going well-   4. Call them and ask for pads for bed and then can add to Rx.    5. F/U in 3 months- for TrP injections

## 2021-02-26 ENCOUNTER — Other Ambulatory Visit: Payer: Self-pay | Admitting: Physical Medicine and Rehabilitation

## 2021-03-04 ENCOUNTER — Encounter: Payer: Self-pay | Admitting: Oncology

## 2021-03-09 NOTE — Progress Notes (Signed)
[facility] Note  03/11/2021     CHIEF COMPLAINT Patient presents for Retina Evaluation   HISTORY OF PRESENT ILLNESS: Kaitlin [name] is a 56 y.o. female who presents to the clinic today for:   HPI     Retina Evaluation   In both eyes.  I, the attending physician,  performed the HPI with the patient and updated documentation appropriately.        Comments   Patient states Dr. Manuella Ghazi referred her.  Macular pucker and RP OU-  She has blurred vision in her peripheral vision x1 year.  She has new glasses and having difficulties reading.   Some days are better than others.  Blurry/foggy vision.  She had blood work done with Dr. Manuella Ghazi for multiple tests (25 tubes).   She had COVID 2020 is when the periphery issue started.  It faded and went away.  Then returned last year.  IDDM II x27 years, A1C 7.0, BS 130 now, 76 this morning at 3am      Last edited by Bernarda Caffey, MD on 03/11/2021  3:14 PM.    Pt is here on the referral of Dr. Manuella Ghazi for macular pucker and RP OU, pt saw him once last year and has not been back due to sickness and having to r/s the appt, Dr. Manuella Ghazi wanted pt to have a FA, pt states she has noticed a decrease in her peripheral vision and having trouble reading, she states she has trouble seeing in the dark, pt states her mom only sees out of one eye, but she is unsure why  Referring physician: Feliz Beam, MD [place],  [place] [address]  HISTORICAL INFORMATION:   Selected notes from the MEDICAL RECORD NUMBER Referred by Dr. Manuella Ghazi for FA LEE: 8.2.22 w/ Dr. Manuella Ghazi (BCVA 20/40 OD, 20/50 OS) Ocular Hx- retinitis pigmentosa OU; ERM OU PMH- DM2, last A1c 7.0 on 01.11.23    CURRENT MEDICATIONS: Current Outpatient Medications (Ophthalmic Drugs)  Medication Sig   triamcinolone acetonide (TRIESENCE) 40 MG/ML SUSP Inject into the articular space.   No current facility-administered medications for this visit. (Ophthalmic Drugs)    Current Outpatient Medications (Other)  Medication Sig   acetaminophen (TYLENOL) 160 MG/5ML solution Take 20.3 mLs (650 mg total) by mouth every 6 (six) hours as needed for mild pain or moderate pain.   albuterol (VENTOLIN HFA) 108 (90 Base) MCG/ACT inhaler One puff every 6 hour as needed   ascorbic acid (VITAMIN C) 500 MG tablet Take 1,500 mg by mouth daily.   aspirin EC 81 MG tablet Take 1 tablet (81 mg total) by mouth daily.   calcium-vitamin D (OSCAL WITH D) 500-200 MG-UNIT tablet Take 2 tablets by mouth daily with breakfast.   carvedilol (COREG) 6.25 MG tablet Take 1 tablet (6.25 mg total) by mouth 2 (two) times daily with a meal.   Cholecalciferol (VITAMIN D-3) 125 MCG (5000 UT) TABS Take 5,000 Units by mouth daily.   diclofenac (VOLTAREN) 50 MG EC tablet TAKE 1 TABLET(50 MG) BY MOUTH DAILY FOR ARTHRITIS   FEROSUL 325 (65 Fe) MG tablet TAKE 1 TABLET(325 MG) BY MOUTH DAILY WITH BREAKFAST   fexofenadine (ALLEGRA) 180 MG tablet Take 180 mg by mouth daily.   Insulin Human (INSULIN PUMP) SOLN Inject into the skin. Novolog   insulin lispro (HUMALOG) 100 UNIT/ML injection USE UP TO 200 UNITS DAILY VIA INSULIN PUMP   lisinopril (ZESTRIL) 5 MG tablet Take 5 mg  by mouth daily.   magnesium oxide (MAG-OX) 400 (241.3 Mg) MG tablet Take 1 tablet (400 mg total) by mouth 2 (two) times daily. (Patient taking differently: Take 400 mg by mouth daily.)   methocarbamol (ROBAXIN) 500 MG tablet Take 1 tablet (500 mg total) by mouth every 8 (eight) hours as needed for muscle spasms.   Multiple Vitamin (MULTIVITAMIN WITH MINERALS) TABS tablet Take 1 tablet by mouth daily.   Multiple Vitamins-Minerals (PRESERVISION AREDS) TABS Take 1 tablet by mouth 2 (two) times daily.   Nirmatrelvir & Ritonavir 20 x 150 MG & 10 x 100MG  TBPK    NOVOLOG 100 UNIT/ML injection Inject into the skin.   potassium chloride SA (KLOR-CON) 20 MEQ tablet Take 20 mEq by mouth 2 (two) times daily.   rosuvastatin (CRESTOR) 40 MG tablet  Take 1 tablet (40 mg total) by mouth daily.   Semaglutide,0.25 or 0.5MG /DOS, (OZEMPIC, 0.25 OR 0.5 MG/DOSE,) 2 MG/1.5ML SOPN Inject 0.25 mg into the skin every 7 (seven) days.   tiZANidine (ZANAFLEX) 2 MG tablet Take 2 mg by mouth 3 (three) times daily.   torsemide (DEMADEX) 20 MG tablet Take 1 tablet (20 mg total) by mouth 2 (two) times daily.   Continuous Blood Gluc Receiver (DEXCOM G6 RECEIVER) DEVI Use as directed for continuous glucose monitoring.   Continuous Blood Gluc Sensor (DEXCOM G6 SENSOR) MISC Inject 1 sensor to the skin every 10 days for continuous glucose monitoring.   Continuous Blood Gluc Transmit (DEXCOM G6 TRANSMITTER) MISC Use as directed for continuous glucose monitoring. Reuse transmitter for 90 days then discard and replace.   diclofenac Sodium (VOLTAREN) 1 % GEL Apply topically. (Patient not taking: Reported on 03/11/2021)   glucose blood (CONTOUR NEXT TEST) test strip Use to test glucose 4-6 times per day.   ketoconazole (NIZORAL) 2 % cream Apply 1 application topically daily. (Patient not taking: Reported on 03/11/2021)   lidocaine (LIDODERM) 5 % Place 1 patch onto the skin daily. Remove & Discard patch within 12 hours or as directed by MD (Patient not taking: Reported on 03/11/2021)   oseltamivir (TAMIFLU) 75 MG capsule Take 1 capsule (75 mg total) by mouth every 12 (twelve) hours. (Patient not taking: Reported on 03/11/2021)   No current facility-administered medications for this visit. (Other)   REVIEW OF SYSTEMS: ROS   Positive for: Gastrointestinal, Endocrine, Cardiovascular, Eyes, Respiratory Negative for: Constitutional, Neurological, Skin, Genitourinary, Musculoskeletal, HENT, Psychiatric, Allergic/Imm, Heme/Lymph Last edited by Leonie Douglas, COA on 03/11/2021  2:05 PM.     ALLERGIES Allergies  Allergen Reactions   Ibuprofen Other (See Comments)    Per MD she should no longer take, unsure why   Amoxicillin-Pot Clavulanate Diarrhea    Did it involve swelling of the  face/tongue/throat, SOB, or low BP? No Did it involve sudden or severe rash/hives, skin peeling, or any reaction on the inside of your mouth or nose? No Did you need to seek medical attention at a hospital or doctor's office? No When did it last happen?    Unknown   If all above answers are "NO", may proceed with cephalosporin use.    PAST MEDICAL HISTORY Past Medical History:  Diagnosis Date   Anemia    Bronchitis    Cataracts, bilateral 01/12/2018   CHF (congestive heart failure) (HCC)    Diabetes mellitus    High cholesterol    Hypertension    takes medicine to protect kidneys, does not have HTN   Leukocytosis    Morbid obesity ([place]) 09/11/2011  BMI 57   Neuropathy 01/12/2018   NICM (nonischemic cardiomyopathy) ([place]) 05/01/2013   Overview:  Last Assessment & Plan:  euvolemic on physical exam.    Obesity    Sickle cell trait ([place])    Thymoma    Past Surgical History:  Procedure Laterality Date   COLONOSCOPY N/A 06/21/2017   Procedure: COLONOSCOPY;  Surgeon: Ronnette Juniper, MD;  Location: [facility];  Service: Gastroenterology;  Laterality: N/A;   LEFT HEART CATHETERIZATION WITH CORONARY ANGIOGRAM N/A 05/25/2013   Procedure: LEFT HEART CATHETERIZATION WITH CORONARY ANGIOGRAM;  Surgeon: Troy Sine, MD;  Location: [facility] CATH LAB;  Service: Cardiovascular;  Laterality: N/A;   LYMPH NODE BIOPSY Right 11/30/2012   Procedure: RIGHT TONSIL BIOPSY WITH FRESH FROZEN ANALYSIS;  Surgeon: Jodi Marble, MD;  Location: [place] [facility];  Service: ENT;  Laterality: Right;   MEDIASTERNOTOMY N/A 01/12/2013   Procedure: MEDIAN STERNOTOMY;  Surgeon: Gaye Pollack, MD;  Location: [facility] OR;  Service: Thoracic;  Laterality: N/A;   MEDIASTINOTOMY CHAMBERLAIN MCNEIL    Right 11/06/2012   Procedure: MEDIASTINOTOMY CHAMBERLAIN MCNEIL PROCEDURE;  Surgeon: Gaye Pollack, MD;  Location: [place] [facility];  Service: Thoracic;  Laterality: Right;   POLYPECTOMY  06/21/2017   Procedure: POLYPECTOMY;  Surgeon: Ronnette Juniper, MD;   Location: [facility];  Service: Gastroenterology;;   RESECTION OF A THYMOMA N/A 01/12/2013   Procedure: RESECTION OF A THYMOMA;  Surgeon: Gaye Pollack, MD;  Location: [facility] OR;  Service: Thoracic;  Laterality: N/A;   TONSILLECTOMY     TUBAL LIGATION     FAMILY HISTORY Family History  Problem Relation Age of Onset   Diabetes Mother    Heart disease Father        No details   Diabetes Maternal Aunt    Diabetes Maternal Uncle    Diabetes Maternal Grandmother    Glaucoma Maternal Grandmother    Breast cancer Maternal Grandmother    Diabetes Maternal Grandfather    Diabetes type II Other        Family HX   Breast cancer Other    SOCIAL HISTORY Social History   Tobacco Use   Smoking status: Never    Passive exposure: Yes   Smokeless tobacco: Never  Vaping Use   Vaping Use: Never used  Substance Use Topics   Alcohol use: No   Drug use: No       OPHTHALMIC EXAM:  Base Eye Exam     Visual Acuity (Snellen - Linear)       Right Left   Dist cc 20/25 20/40   Dist ph cc NI 20/30    Correction: Glasses         Tonometry (Tonopen, 2:21 PM)       Right Left   Pressure 16 14         Pupils       Dark Light Shape React APD   Right 5 4 Round Brisk None   Left 5 4 Round Brisk None         Visual Fields (Counting fingers)       Left Right   Restrictions Partial outer superior temporal, inferior temporal deficiencies Partial outer superior temporal, inferior temporal deficiencies         Extraocular Movement       Right Left    Full Full         Neuro/Psych     Oriented x3: Yes   Mood/Affect: Normal  Dilation     Both eyes: 1.0% Mydriacyl, 2.5% Phenylephrine @ 2:21 PM           Slit Lamp and Fundus Exam     Slit Lamp Exam       Right Left   Lids/Lashes Normal Normal   Conjunctiva/Sclera mild melanosis mild melanosis   Cornea arcus arcus   Anterior Chamber Deep and quiet Deep and quiet   Iris Round and dilated Round and  dilated   Lens 2+ Nuclear sclerosis, 2+ Cortical cataract 2+ Nuclear sclerosis, 2+ Cortical cataract   Anterior Vitreous Vitreous syneresis Vitreous syneresis         Fundus Exam       Right Left   Disc Pink and Sharp, mild PPA/PPP, no waxy pallor Pink and Sharp, PPP, no waxy pallor   C/D Ratio 0.3 0.3   Macula Flat, Blunted foveal reflex, ERM, RPE mottling and clumping, No heme or edema Flat, Blunted foveal reflex, drusen, RPE mottling and clumping, mild ERM, No heme or edema   Vessels attenuated, Tortuous, +peripapillary pigmented bone spicules, perivascular RPE atrophy along arcades attenuated, Tortuous, +peripapillary pigmented bone spicules, perivascular RPE atrophy along arcades   Periphery Attached, focal patches of bone spicules, mild peripheral drusen and atrophy, No heme Attached, focal patches of bone spicules, mild peripheral drusen and atrophy, No heme           Refraction     Wearing Rx       Sphere Cylinder Axis Add   Right -0.75 +0.25 130 +2.25   Left -0.50 Sphere  +2.25         Manifest Refraction       Sphere Cylinder Dist VA   Right -0.50 Sphere NI   Left [place]  NI           IMAGING AND PROCEDURES  Imaging and Procedures for 03/11/2021  OCT, Retina - OU - Both Eyes       Right Eye Quality was good. Central Foveal Thickness: 291. Progression has no prior data. Findings include normal foveal contour, no IRF, no SRF, epiretinal membrane, retinal drusen , outer retinal atrophy (Mild ERM with central drusen, peripheral atrophy / ellipsoid loss).   Left Eye Quality was good. Progression has no prior data. Findings include normal foveal contour, no IRF, no SRF, epiretinal membrane, outer retinal atrophy, retinal drusen (Mild ERM with central drusen, peripheral atrophy / ellipsoid loss).   Notes *Images captured and stored on drive  Diagnosis / Impression:  NFP; no IRF/SRF OU Mild ERM OU central drusen OU peripheral atrophy / ellipsoid loss  OU  Clinical management:  See below  Abbreviations: NFP - Normal foveal profile. CME - cystoid macular edema. PED - pigment epithelial detachment. IRF - intraretinal fluid. SRF - subretinal fluid. EZ - ellipsoid zone. ERM - epiretinal membrane. ORA - outer retinal atrophy. ORT - outer retinal tubulation. SRHM - subretinal hyper-reflective material. IRHM - intraretinal hyper-reflective material      Fluorescein Angiography Optos (Transit OD)       Right Eye Progression has no prior data. Early phase findings include delayed filling, staining, window defect, blockage (Patchy choroidal filling). Mid/Late phase findings include staining, blockage, window defect (No leakage or vasculitis).   Left Eye Progression has no prior data. Early phase findings include window defect, staining, blockage. Mid/Late phase findings include blockage, staining, window defect (No leakage or vasculitis).   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 Epiretinal membrane (ERM) of both eyes  H35.373 OCT, Retina - OU - Both Eyes    3. Essential hypertension  I10     4. Hypertensive retinopathy of both eyes  H35.033 Fluorescein Angiography Optos (Transit OD)    CANCELED: Fluorescein Angiography Optos (Transit OS)    5. Diabetes mellitus type 2 without retinopathy ([place])  E11.9 OCT, Retina - OU - Both Eyes    6. Combined forms of age-related cataract of both eyes  H25.813      1. Retinitis Pigmentosa OU  - pt initially presented to Dr. Rex Kras with complaints of blurred vision, decreased night vision and decreased peripheral vision  - pt was  referred to Dr. Manuella Ghazi -- initial consult 8.2.22 -- lab work for inflammatory etiologies was initiated and pt was scheduled for return to [facility] for [place]  - pt was unable to schedule visit to [facility] for FA and requested FA here -- Dr. Manuella Ghazi referred pt here for FA.  - BCVA 20/25 OD, 20/30 OS  - exam shows focal bone spicule pigment deposits OU and peripheral RPE atrophy  - FA today (03.01.23) shows Peripapillary window defects with extension along arcades OD>OS; Focal areas of blockage corresponding to bone spicule pigment clumping -- no leakage or vasculitis OU  - will share images with Dr. Manuella Ghazi for review  - management per Dr. Manuella Ghazi  - pt can f/u here prn  2. Epiretinal membrane, both eyes  - The natural history, anatomy, potential for loss of vision, and treatment options including vitrectomy techniques and the complications of endophthalmitis, retinal detachment, vitreous hemorrhage, cataract progression and permanent vision loss discussed with the patient. - mild ERM - BCVA OD: 20/25; OS: 20/30 - asymptomatic, no metamorphopsia - monitor  3,4. Hypertensive retinopathy OU - discussed importance of tight BP control - monitor  5. Diabetes mellitus, type 2 without retinopathy  - A1c 7.0 on 01.11.23 - The incidence, risk factors for progression, natural history and treatment options for diabetic retinopathy  were discussed with patient.   - The need for close monitoring of blood glucose, blood pressure, and serum lipids, avoiding cigarette or any type of tobacco, and the need for long term follow up was also discussed with patient.  6. Mixed Cataract OU - The symptoms of cataract, surgical options, and treatments and risks were discussed with patient. - discussed diagnosis and progression - monitor  Ophthalmic Meds Ordered this visit:  No orders of the defined types were placed in this encounter.    Return if symptoms worsen or fail to improve.  There are no Patient Instructions  on file for this visit.   Explained the diagnoses, plan, and follow up with the patient and they expressed understanding.  Patient expressed understanding of the importance of proper follow up care.   Gardiner Sleeper, M.D., Ph.D. Diseases & Surgery of the Retina and Vitreous [facility]  I have reviewed the above documentation for accuracy and completeness, and I agree with the above. Gardiner Sleeper, M.D., Ph.D. 03/11/21 5:08 PM  Abbreviations: M myopia (nearsighted); A astigmatism; H hyperopia (farsighted); P presbyopia;  Mrx spectacle prescription;  CTL contact lenses; OD right eye; OS left eye; OU both eyes  XT exotropia; ET esotropia; PEK punctate epithelial keratitis; PEE punctate epithelial erosions; DES dry eye syndrome; MGD meibomian gland dysfunction; ATs artificial tears; PFAT's preservative free artificial tears; [place] nuclear sclerotic cataract; PSC posterior subcapsular cataract; ERM epi-retinal membrane; PVD posterior vitreous detachment; RD retinal detachment; DM diabetes mellitus; DR diabetic retinopathy; NPDR non-proliferative diabetic retinopathy; PDR proliferative diabetic retinopathy; CSME clinically significant macular edema; DME diabetic macular edema; dbh dot blot hemorrhages; CWS cotton wool spot; POAG primary open angle glaucoma; C/D cup-to-disc ratio; HVF humphrey visual field; GVF goldmann visual field; OCT optical coherence tomography; IOP intraocular pressure; BRVO Branch retinal vein occlusion; CRVO central retinal vein occlusion; CRAO central retinal artery occlusion; BRAO branch retinal artery occlusion; RT retinal tear; SB scleral buckle; PPV pars plana vitrectomy; VH Vitreous hemorrhage; PRP panretinal laser photocoagulation; IVK intravitreal kenalog; VMT vitreomacular traction; MH Macular hole;  NVD neovascularization of the disc; NVE neovascularization elsewhere; AREDS age related eye disease study; ARMD age related macular degeneration; POAG  primary open angle glaucoma; EBMD epithelial/anterior basement membrane dystrophy; ACIOL anterior chamber intraocular lens; IOL intraocular lens; PCIOL posterior chamber intraocular lens; Phaco/IOL phacoemulsification with intraocular lens placement; [place] photorefractive keratectomy; LASIK laser assisted in situ keratomileusis; HTN hypertension; DM diabetes mellitus; COPD chronic obstructive pulmonary disease

## 2021-03-11 ENCOUNTER — Encounter (INDEPENDENT_AMBULATORY_CARE_PROVIDER_SITE_OTHER): Payer: Self-pay | Admitting: Ophthalmology

## 2021-03-11 ENCOUNTER — Other Ambulatory Visit: Payer: Self-pay

## 2021-03-11 ENCOUNTER — Ambulatory Visit (INDEPENDENT_AMBULATORY_CARE_PROVIDER_SITE_OTHER): Payer: Medicare Other | Admitting: Ophthalmology

## 2021-03-11 DIAGNOSIS — H3581 Retinal edema: Secondary | ICD-10-CM

## 2021-03-11 DIAGNOSIS — H35033 Hypertensive retinopathy, bilateral: Secondary | ICD-10-CM | POA: Diagnosis not present

## 2021-03-11 DIAGNOSIS — H25813 Combined forms of age-related cataract, bilateral: Secondary | ICD-10-CM

## 2021-03-11 DIAGNOSIS — E119 Type 2 diabetes mellitus without complications: Secondary | ICD-10-CM

## 2021-03-11 DIAGNOSIS — H3552 Pigmentary retinal dystrophy: Secondary | ICD-10-CM | POA: Diagnosis not present

## 2021-03-11 DIAGNOSIS — I10 Essential (primary) hypertension: Secondary | ICD-10-CM | POA: Diagnosis not present

## 2021-03-11 DIAGNOSIS — H35373 Puckering of macula, bilateral: Secondary | ICD-10-CM

## 2021-05-08 ENCOUNTER — Encounter: Payer: Medicare Other | Admitting: Physical Medicine and Rehabilitation

## 2021-05-14 ENCOUNTER — Other Ambulatory Visit: Payer: Self-pay | Admitting: Physical Medicine and Rehabilitation

## 2021-06-01 ENCOUNTER — Other Ambulatory Visit: Payer: Self-pay | Admitting: Physical Medicine and Rehabilitation

## 2021-07-08 ENCOUNTER — Encounter: Payer: Self-pay | Admitting: Physical Medicine and Rehabilitation

## 2021-07-08 ENCOUNTER — Encounter
Payer: Medicare Other | Attending: Physical Medicine and Rehabilitation | Admitting: Physical Medicine and Rehabilitation

## 2021-07-08 VITALS — BP 124/79 | HR 88 | Ht 69.0 in | Wt 391.6 lb

## 2021-07-08 DIAGNOSIS — U099 Post covid-19 condition, unspecified: Secondary | ICD-10-CM | POA: Insufficient documentation

## 2021-07-08 DIAGNOSIS — M7918 Myalgia, other site: Secondary | ICD-10-CM | POA: Diagnosis present

## 2021-07-08 NOTE — Patient Instructions (Signed)
Plan: Can send back to therapy at next appointment- has to be out 1 year and have reduction in function. Has bene walking - could benefit from rolator-   Patient here for trigger point injections for  Consent done and on chart.  Cleaned areas with alcohol and injected using a 27 gauge 1.5 inch needle  2. Injected 6cc Using 1% Lidocaine with no EPI  Upper traps- B/L  Levators- B/L Posterior scalenes Middle scalenes- B/L Splenius Capitus Pectoralis Major Rhomboids- B/L x3 Infraspinatus Teres Major/minor Thoracic paraspinals Lumbar paraspinals Other injections-    Patient's level of pain prior was 05/15/08 Current level of pain after injections is- 5-05/15/08- hasn't changed- usually gets better by the time in car.   There was no bleeding or complications.  Patient was advised to drink a lot of water on day after injections to flush system Will have increased soreness for 12-48 hours after injections.  Can use Lidocaine patches the day AFTER injections Can use theracane on day of injections in places didn't inject Can use heating pad 4-6 hours AFTER injections   3. F/u in 26month

## 2021-07-08 NOTE — Progress Notes (Signed)
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 Still has severe difficulty with endurance due to ICU myopathy.   BMI 57 Here for f/u on long COVID.   Wants some trigger point injections today.    Raised son's rent- because couldn't afford.  Has had to move 2x- had to speak with lawyer.  Had to move back in with daughter- very stressful.    Last time had sinus infection.  Medically feeling better.    Missed appointment with me since was sick again- with sinus issues, but is doing better.    Is walking- was asking about rolator- just cannot walk as far.   Felt so much better when was going- not doing home exercise program- was easier in water per pt.      Plan: Can send back to therapy at next appointment- has to be out 1 year and have reduction in function. Has bene walking - could benefit from rolator-   Patient here for trigger point injections for  Consent done and on chart.  Cleaned areas with alcohol and injected using a 27 gauge 1.5 inch needle  2. Injected 6cc Using 1% Lidocaine with no EPI  Upper traps- B/L  Levators- B/L Posterior scalenes Middle scalenes- B/L Splenius Capitus Pectoralis Major Rhomboids- B/L x3 Infraspinatus Teres Major/minor Thoracic paraspinals Lumbar paraspinals Other injections-    Patient's level of pain prior was 05/15/08 Current level of pain after injections is- 5-05/15/08- hasn't changed- usually gets better by the time in car.   There was no bleeding or complications.  Patient was advised to drink a lot of water on day after injections to flush system Will have increased soreness for 12-48 hours after injections.  Can use Lidocaine patches the day AFTER injections Can use theracane on day of injections in places didn't inject Can use heating pad 4-6 hours AFTER injections   3. F/u in 1month

## 2021-07-15 ENCOUNTER — Other Ambulatory Visit: Payer: Self-pay | Admitting: Physician Assistant

## 2021-07-15 DIAGNOSIS — Z1231 Encounter for screening mammogram for malignant neoplasm of breast: Secondary | ICD-10-CM

## 2021-08-06 ENCOUNTER — Ambulatory Visit
Admission: RE | Admit: 2021-08-06 | Discharge: 2021-08-06 | Disposition: A | Discharge status: Home / Self Care | Payer: Medicare Other | Source: Ambulatory Visit | Attending: Physician Assistant | Admitting: Physician Assistant

## 2021-08-06 DIAGNOSIS — Z1231 Encounter for screening mammogram for malignant neoplasm of breast: Secondary | ICD-10-CM

## 2021-09-07 ENCOUNTER — Encounter: Payer: Self-pay | Admitting: Physical Medicine and Rehabilitation

## 2021-09-07 ENCOUNTER — Encounter
Payer: Medicare Other | Attending: Physical Medicine and Rehabilitation | Admitting: Physical Medicine and Rehabilitation

## 2021-09-07 VITALS — BP 135/81 | HR 89 | Ht 69.0 in | Wt 391.6 lb

## 2021-09-07 DIAGNOSIS — N3946 Mixed incontinence: Secondary | ICD-10-CM | POA: Diagnosis present

## 2021-09-07 DIAGNOSIS — G7281 Critical illness myopathy: Secondary | ICD-10-CM | POA: Insufficient documentation

## 2021-09-07 DIAGNOSIS — U099 Post covid-19 condition, unspecified: Secondary | ICD-10-CM | POA: Insufficient documentation

## 2021-09-07 DIAGNOSIS — M7918 Myalgia, other site: Secondary | ICD-10-CM | POA: Diagnosis present

## 2021-09-07 MED ORDER — LIDOCAINE HCL 1 % IJ SOLN
12.0000 mL | Freq: Once | INTRAMUSCULAR | Status: AC
  Administered 2021-09-07: 12 mL

## 2021-09-07 NOTE — Patient Instructions (Signed)
Plan: PT referral- at [facility] for strengthening and endurance. Call to scheduled appointment [phone]- [phone]  2. Patient here for trigger point injections for  Consent done and on chart.  Cleaned areas with alcohol and injected using a 27 gauge 1.5 inch needle  Injected 4.5cc- wasted 1.5 cc Using 1% Lidocaine with no EPI  Upper traps- B/L  Levators- B/L  Posterior scalenes Middle scalenes- B/L  Splenius Capitus Pectoralis Major Rhomboids Infraspinatus Teres Major/minor Thoracic paraspinals Lumbar paraspinals Other injections- B/L supraspinatus   Patient's level of pain prior was 4-4.5/10 Current level of pain after injections is still ~ 4/10  There was no bleeding or complications.  Patient was advised to drink a lot of water on day after injections to flush system Will have increased soreness for 12-48 hours after injections.  Can use Lidocaine patches the day AFTER injections Can use theracane on day of injections in places didn't inject Can use heating pad 4-6 hours AFTER injections   3. Needs incontinence supplies still due to not getting to bathroom in time.   4. F/U- 3 months- TrP injections and f/u on ICU myopahty

## 2021-09-07 NOTE — Progress Notes (Signed)
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 Still has severe difficulty with endurance due to ICU myopathy.     Here for f/u on Long COVID and side effects including incontinence and impaired gait or impaired core strength and endurance.    Broke eyeglasses- needs ot get them fixed.     Plan: PT referral- at [facility] for strengthening and endurance. Call to scheduled appointment [phone]- [phone]  2. Patient here for trigger point injections for  Consent done and on chart.  Cleaned areas with alcohol and injected using a 27 gauge 1.5 inch needle  Injected 4.5cc- wasted 1.5 cc Using 1% Lidocaine with no EPI  Upper traps- B/L  Levators- B/L  Posterior scalenes Middle scalenes- B/L  Splenius Capitus Pectoralis Major Rhomboids Infraspinatus Teres Major/minor Thoracic paraspinals Lumbar paraspinals Other injections- B/L supraspinatus   Patient's level of pain prior was 4-4.5/10 Current level of pain after injections is still ~ 4/10  There was no bleeding or complications.  Patient was advised to drink a lot of water on day after injections to flush system Will have increased soreness for 12-48 hours after injections.  Can use Lidocaine patches the day AFTER injections Can use theracane on day of injections in places didn't inject Can use heating pad 4-6 hours AFTER injections   3. Needs incontinence supplies still due to not getting to bathroom in time.   4. F/U- 3 months- TrP injections and f/u on ICU myopahty

## 2021-09-11 ENCOUNTER — Other Ambulatory Visit: Payer: Self-pay | Admitting: Physical Medicine and Rehabilitation

## 2021-10-06 ENCOUNTER — Encounter (HOSPITAL_BASED_OUTPATIENT_CLINIC_OR_DEPARTMENT_OTHER): Payer: Self-pay | Admitting: Physical Therapy

## 2021-10-06 ENCOUNTER — Ambulatory Visit (HOSPITAL_BASED_OUTPATIENT_CLINIC_OR_DEPARTMENT_OTHER): Payer: Medicare Other | Attending: Physical Medicine and Rehabilitation | Admitting: Physical Therapy

## 2021-10-06 DIAGNOSIS — M6281 Muscle weakness (generalized): Secondary | ICD-10-CM | POA: Diagnosis present

## 2021-10-06 DIAGNOSIS — R262 Difficulty in walking, not elsewhere classified: Secondary | ICD-10-CM | POA: Diagnosis present

## 2021-10-06 DIAGNOSIS — R252 Cramp and spasm: Secondary | ICD-10-CM | POA: Diagnosis present

## 2021-10-06 DIAGNOSIS — G7281 Critical illness myopathy: Secondary | ICD-10-CM | POA: Diagnosis not present

## 2021-10-06 DIAGNOSIS — R2681 Unsteadiness on feet: Secondary | ICD-10-CM

## 2021-10-06 DIAGNOSIS — U099 Post covid-19 condition, unspecified: Secondary | ICD-10-CM | POA: Diagnosis present

## 2021-10-06 DIAGNOSIS — Z7409 Other reduced mobility: Secondary | ICD-10-CM | POA: Diagnosis present

## 2021-10-06 DIAGNOSIS — M7918 Myalgia, other site: Secondary | ICD-10-CM | POA: Diagnosis not present

## 2021-10-06 NOTE — Therapy (Signed)
OUTPATIENT PHYSICAL THERAPY LOWER EXTREMITY EVALUATION   Patient Name: Kaitlin [name] MRN: [id number] DOB:Oct 05, 1965, 56 y.o., female Today's Date: 10/06/2021   PT End of Session - 10/06/21 1115     Visit Number 1    Number of Visits 25    Date for PT Re-Evaluation 12/29/21    Authorization Type Medicare    Authorization Time Period 10/06/21 to 12/29/21    Progress Note Due on Visit 10    PT Start Time 6314    PT Stop Time 1056    PT Time Calculation (min) 41 min    Activity Tolerance Patient tolerated treatment well    Behavior During Therapy [facility] for tasks assessed/performed             Past Medical History:  Diagnosis Date   Anemia    Bronchitis    Cataracts, bilateral 01/12/2018   CHF (congestive heart failure) (HCC)    Diabetes mellitus    High cholesterol    Hypertension    takes medicine to protect kidneys, does not have HTN   Leukocytosis    Morbid obesity ([place]) 09/11/2011   BMI 57   Neuropathy 01/12/2018   NICM (nonischemic cardiomyopathy) ([place]) 05/01/2013   Overview:  Last Assessment & Plan:  euvolemic on physical exam.    Obesity    Sickle cell trait ([place])    Thymoma    Past Surgical History:  Procedure Laterality Date   COLONOSCOPY N/A 06/21/2017   Procedure: COLONOSCOPY;  Surgeon: Ronnette Juniper, MD;  Location: [facility];  Service: Gastroenterology;  Laterality: N/A;   LEFT HEART CATHETERIZATION WITH CORONARY ANGIOGRAM N/A 05/25/2013   Procedure: LEFT HEART CATHETERIZATION WITH CORONARY ANGIOGRAM;  Surgeon: Troy Sine, MD;  Location: [facility];  Service: Cardiovascular;  Laterality: N/A;   LYMPH NODE BIOPSY Right 11/30/2012   Procedure: RIGHT TONSIL BIOPSY WITH FRESH FROZEN ANALYSIS;  Surgeon: Jodi Marble, MD;  Location: [place];  Service: ENT;  Laterality: Right;   MEDIASTERNOTOMY N/A 01/12/2013   Procedure: MEDIAN STERNOTOMY;  Surgeon: Gaye Pollack, MD;  Location: [place];  Service: Thoracic;  Laterality: N/A;   MEDIASTINOTOMY CHAMBERLAIN MCNEIL     Right 11/06/2012   Procedure: MEDIASTINOTOMY CHAMBERLAIN MCNEIL PROCEDURE;  Surgeon: Gaye Pollack, MD;  Location: [place];  Service: Thoracic;  Laterality: Right;   POLYPECTOMY  06/21/2017   Procedure: POLYPECTOMY;  Surgeon: Ronnette Juniper, MD;  Location: [facility];  Service: Gastroenterology;;   RESECTION OF A THYMOMA N/A 01/12/2013   Procedure: RESECTION OF A THYMOMA;  Surgeon: Gaye Pollack, MD;  Location: [place];  Service: Thoracic;  Laterality: N/A;   TONSILLECTOMY     TUBAL LIGATION     Patient Active Problem List   Diagnosis Date Noted   Long COVID 01/02/2020   Mixed stress and urge urinary incontinence 07/04/2019   Nerve pain 05/16/2019   Myofascial pain 05/16/2019   Subluxation of acromioclavicular joint, right, sequela 03/23/2019   Sickle cell trait ([place])    Labile blood glucose    Diabetic peripheral neuropathy (HCC)    Hypokalemia    Pain in joint of left shoulder    Debility    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   COVID-19    History of anemia    Super obese    Poorly controlled type 2 diabetes mellitus with peripheral neuropathy ([place])    Acute on chronic diastolic (congestive) heart failure ([place])    Hypernatremia    Shock circulatory ([place])  Septic shock (HCC)    Hypotension 01/13/2019   Acute respiratory failure with hypoxemia (HCC) 01/13/2019   Acute respiratory distress syndrome (ARDS) due to COVID-19 virus ([place]) 01/12/2019   Grade I diastolic dysfunction 57/84/6962   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   Dyspnea on exertion 05/19/2016   Other fatigue 05/19/2016   Absolute anemia 04/15/2016   Left leg weakness 10/18/2014   Left leg paresthesias 10/18/2014   Acute left-sided weakness 10/18/2014   Encounter for central line placement 03/08/2014   Taking drug for chronic disease 03/08/2014   H/O insertion of insulin pump 02/18/2014   Long term current use of insulin ([place]) 02/18/2014   Insulin pump in place 02/18/2014   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    Abnormal ballistocardiogram 05/18/2013   Abnormal nuclear stress test 05/18/2013   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   NICM (nonischemic cardiomyopathy) ([place]) 05/01/2013   Insulin dependent type 2 diabetes mellitus ([place]) 04/10/2013   Sex counseling 04/10/2013   Edema leg 04/10/2013   Upper respiratory infection, viral 04/10/2013   Pedal edema 03/29/2013   Pneumonia 12/12/2012   Essential hypertension 12/12/2012   HLD (hyperlipidemia) 12/12/2012   Tonsillar mass 12/01/2012   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   Leukocytosis    Anemia    High cholesterol    Hypertension    Blurred vision 09/11/2011   Foot pain 09/11/2011   Breast pain 09/11/2011   Morbid obesity ([place]) 09/11/2011   Fungal infection of nail 09/11/2011   Avitaminosis D 09/11/2011    PCP: Diamantina Providence PA-C   REFERRING PROVIDER: Courtney Heys, MD   REFERRING DIAG: M79.18 (ICD-10-CM) - Myofascial pain U09.9 (ICD-10-CM) - Long COVID G72.81 (ICD-10-CM) - Intensive care (ICU) myopathy   THERAPY DIAG:  Muscle weakness (generalized) - Plan: PT plan of care cert/re-cert  Difficulty in walking, not elsewhere classified - Plan: PT plan of care cert/re-cert  Unsteadiness on feet - Plan: PT plan of care cert/re-cert  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 I can sit, at one time I couldn't sit, I have a hard time standing for a long period of time and I have balance issues. Steps are hard for me but I can still do them. I can walk but not as far as I'd like to. It depends on the day. I still have fatigue, my chest hurt a little after I  walked in I just need to sit and rest with activities. I have back pain and neck pain too. I have some muscle spasms in my legs too. I did not have issues with this myofascial pain before COVID.   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 Still has severe difficulty with endurance due to ICU myopathy.     Here for f/u on Long COVID and side effects including incontinence and impaired gait or impaired core strength and endurance.      Broke eyeglasses- needs ot get them fixed.        Plan: PT referral- at [facility] for strengthening and endurance. Call to scheduled appointment [phone]  PAIN:  Are you having pain? Yes: NPRS scale: 5.5/10 Pain location: everywhere  Pain description: aching  Aggravating factors: doing too much of certain things like standing/walking  Relieving factors: not sure, maybe tylenol   PRECAUTIONS: None  WEIGHT BEARING RESTRICTIONS No  FALLS:  Has patient fallen in last 6 months? No  LIVING ENVIRONMENT: Lives with: lives with their family daughter/grandson/son Lives in: House/apartment Stairs: split level home with 15 steps total, also has 15 STE; steps outside have B rails, inside has U rail  Has following equipment at home: shower chair  OCCUPATION: on disability   PLOF: Independent, Independent with basic ADLs, Independent with gait, and Independent with transfers  PATIENT GOALS improve strength and endurance, improve balance, reduce pain if able    OBJECTIVE:   DIAGNOSTIC FINDINGS:   PATIENT SURVEYS:  FOTO 42.8  COGNITION:  Overall cognitive status: Within functional limits for tasks assessed     SENSATION: Reports hands are numb, also has trigger finger in several fingers; feet are numb too (neuropathy)  EDEMA:    MUSCLE LENGTH:   POSTURE: rounded shoulders, forward head, increased lumbar lordosis, increased thoracic kyphosis, and flexed trunk    PALPATION: Multiple severe trigger points from about T7 and up B   LOWER EXTREMITY ROM:  Active ROM Right eval Left eval  Hip flexion    Hip extension    Hip abduction    Hip adduction    Hip internal rotation    Hip external rotation    Knee flexion    Knee extension    Ankle dorsiflexion    Ankle plantarflexion    Ankle inversion    Ankle eversion     (Blank rows = not tested)  LOWER EXTREMITY MMT:  MMT Right eval Left eval  Hip flexion 3 3-  Hip extension    Hip abduction 3 4  Hip adduction    Hip internal rotation    Hip external rotation    Knee flexion 4+ 3  Knee extension 5 4-  Ankle dorsiflexion 5 5  Ankle plantarflexion    Ankle inversion    Ankle eversion     (Blank rows = not tested)  LOWER EXTREMITY SPECIAL TESTS:    FUNCTIONAL TESTS:  5 times sit to stand: 24 seconds use of BUEs  3 minute walk test: 250f (1 seated rest break after 1142f Berg Balance Scale: 34/56  GAIT: Distance walked: slow and steady but antalgic, reduced ankle DF noted especially on the L, + trendelenburg  Assistive device utilized: None Level of assistance: Complete Independence Comments: easily fatigued     TODAY'S TREATMENT: Education on POC, prognosis, HEP to be given next session, possible trial of DN    PATIENT EDUCATION:  Education details: See above  Person educated: Patient Education method: Explanation Education comprehension: verbalized understanding   HOME EXERCISE PROGRAM: Will be given next session   ASSESSMENT:  CLINICAL IMPRESSION: Patient is a [age].o. F who was seen today for physical therapy evaluation and treatment for ICU myopathy, long COVID, and myofascial pain. Of note, she did receive PT in the past and reports she did well with it at the time but continues to have significant impairments in multiple systems after COVID. Exam reveals significant impairments in functional strength, balance, functional activity tolerance, and multiple  areas of soft tissue impairments/trigger points especially in upper back/cervical area. Needed multiple rest breaks during session. Will benefit from skilled PT services to address functional impairments and optimize overall level of  function.    OBJECTIVE IMPAIRMENTS Abnormal gait, cardiopulmonary status limiting activity, decreased activity tolerance, decreased balance, decreased coordination, decreased endurance, decreased knowledge of use of DME, decreased mobility, difficulty walking, decreased strength, increased fascial restrictions, increased muscle spasms, impaired flexibility, impaired UE functional use, postural dysfunction, obesity, and pain.   ACTIVITY LIMITATIONS sitting, standing, stairs, transfers, bed mobility, reach over head, and locomotion level  PARTICIPATION LIMITATIONS: meal prep, cleaning, laundry, shopping, community activity, occupation, and yard work  PERSONAL FACTORS Fitness, Past/current experiences, Time since onset of injury/illness/exacerbation, and 1 comorbidity: long covid with multiple high risk health factors  are also affecting patient's functional outcome.   REHAB POTENTIAL: Good  CLINICAL DECISION MAKING: Evolving/moderate complexity  EVALUATION COMPLEXITY: Moderate   GOALS: Goals reviewed with patient? Yes  SHORT TERM GOALS: Target date: 11/17/2021  Will be compliant with appropriate progressive HEP to include regular/progressive walking program  Baseline: Goal status: INITIAL  2.  Will be able to ambulate at least 283f in 3MWT without a rest break to show improved functional activity tolerance  Baseline:  Goal status: INITIAL  3.  Will demonstrate a 25% improvement in trigger points as a whole to reduce pain  Baseline:  Goal status: INITIAL  4.  Will be able to describe at least 3 methods to conserve energy/for fatigue management in order to improve QOL  Baseline:  Goal status: INITIAL  5.  Will be able to perform 5x STS test in 16  seconds or less to show improved functional activity tolerance  Baseline:  Goal status: INITIAL    LONG TERM GOALS: Target date: 12/29/2021   Will improve MMT by at least 1 grade in all weak groups to show improved functional strength  Baseline:  Goal status: INITIAL  2.  Will be able to ambulate at least 3567fin 3MWT with no rest break to show improved functional activity tolerance/improve community access   Baseline:  Goal status: INITIAL  3.  Will score at least 42 on Berg balance test to show improved functional balance/reduced fall risk  Baseline:  Goal status: INITIAL  4.  Will be able to cook a meal and wash dishes in standing without a rest break in order to show improved strength and functional activity tolerance  Baseline:  Goal status: INITIAL  5.  Will be compliant with appropriate gym or pool based exercise program to maintain functional gains from PT  Baseline:  Goal status: INITIAL    PLAN: PT FREQUENCY: 2x/week  PT DURATION: 12 weeks  PLANNED INTERVENTIONS: Therapeutic exercises, Therapeutic activity, Neuromuscular re-education, Balance training, Gait training, Patient/Family education, Self Care, Joint mobilization, Stair training, DME instructions, Aquatic Therapy, Dry Needling, Electrical stimulation, Spinal mobilization, Cryotherapy, Moist heat, Taping, Ultrasound, Ionotophoresis '4mg'$ /ml Dexamethasone, Manual therapy, and Re-evaluation  PLAN FOR NEXT SESSION: first 8 weeks in water, last 4 weeks on land- focus on strength, balance, functional activity tolerance; consider DN to trigger points (if DN certified clinician feels this is appropriate)   Malikah Principato U PT DPT PN2  10/06/2021, 11:19 AM

## 2021-10-14 ENCOUNTER — Encounter (HOSPITAL_BASED_OUTPATIENT_CLINIC_OR_DEPARTMENT_OTHER): Payer: Self-pay | Admitting: Physical Therapy

## 2021-10-14 ENCOUNTER — Ambulatory Visit (HOSPITAL_BASED_OUTPATIENT_CLINIC_OR_DEPARTMENT_OTHER): Payer: Medicare Other | Attending: Physical Medicine and Rehabilitation | Admitting: Physical Therapy

## 2021-10-14 DIAGNOSIS — R293 Abnormal posture: Secondary | ICD-10-CM | POA: Diagnosis present

## 2021-10-14 DIAGNOSIS — R2681 Unsteadiness on feet: Secondary | ICD-10-CM | POA: Insufficient documentation

## 2021-10-14 DIAGNOSIS — R262 Difficulty in walking, not elsewhere classified: Secondary | ICD-10-CM | POA: Diagnosis present

## 2021-10-14 DIAGNOSIS — M6281 Muscle weakness (generalized): Secondary | ICD-10-CM | POA: Insufficient documentation

## 2021-10-14 NOTE — Therapy (Signed)
OUTPATIENT PHYSICAL THERAPY LOWER EXTREMITY EVALUATION   Patient Name: Kaitlin [name] MRN: [id number] DOB:[name] 08, 1966, 56 y.o., female Today's Date: 10/14/2021   PT End of Session - 10/14/21 1248     Visit Number 2    Number of Visits 25    Date for PT Re-Evaluation 12/29/21    Authorization Type Medicare    Authorization Time Period 10/06/21 to 12/29/21    Progress Note Due on Visit 10    PT Start Time 1247    PT Stop Time 1329    PT Time Calculation (min) 42 min    Activity Tolerance Patient tolerated treatment well    Behavior During Therapy [facility] for tasks assessed/performed             Past Medical History:  Diagnosis Date   Anemia    Bronchitis    Cataracts, bilateral 01/12/2018   CHF (congestive heart failure) ([place])    Diabetes mellitus    High cholesterol    Hypertension    takes medicine to protect kidneys, does not have HTN   Leukocytosis    Morbid obesity ([place]) 09/11/2011   BMI 57   Neuropathy 01/12/2018   NICM (nonischemic cardiomyopathy) ([place]) 05/01/2013   Overview:  Last Assessment & Plan:  euvolemic on physical exam.    Obesity    Sickle cell trait ([place])    Thymoma    Past Surgical History:  Procedure Laterality Date   COLONOSCOPY N/A 06/21/2017   Procedure: COLONOSCOPY;  Surgeon: Ronnette Juniper, MD;  Location: [facility];  Service: Gastroenterology;  Laterality: N/A;   LEFT HEART CATHETERIZATION WITH CORONARY ANGIOGRAM N/A 05/25/2013   Procedure: LEFT HEART CATHETERIZATION WITH CORONARY ANGIOGRAM;  Surgeon: Troy Sine, MD;  Location: [facility] CATH LAB;  Service: Cardiovascular;  Laterality: N/A;   LYMPH NODE BIOPSY Right 11/30/2012   Procedure: RIGHT TONSIL BIOPSY WITH FRESH FROZEN ANALYSIS;  Surgeon: Jodi Marble, MD;  Location: [place];  Service: ENT;  Laterality: Right;   MEDIASTERNOTOMY N/A 01/12/2013   Procedure: MEDIAN STERNOTOMY;  Surgeon: Gaye Pollack, MD;  Location: [place];  Service: Thoracic;  Laterality: N/A;   MEDIASTINOTOMY CHAMBERLAIN MCNEIL     Right 11/06/2012   Procedure: MEDIASTINOTOMY CHAMBERLAIN MCNEIL PROCEDURE;  Surgeon: Gaye Pollack, MD;  Location: [place];  Service: Thoracic;  Laterality: Right;   POLYPECTOMY  06/21/2017   Procedure: POLYPECTOMY;  Surgeon: Ronnette Juniper, MD;  Location: [facility];  Service: Gastroenterology;;   RESECTION OF A THYMOMA N/A 01/12/2013   Procedure: RESECTION OF A THYMOMA;  Surgeon: Gaye Pollack, MD;  Location: [place];  Service: Thoracic;  Laterality: N/A;   TONSILLECTOMY     TUBAL LIGATION     Patient Active Problem List   Diagnosis Date Noted   Long COVID 01/02/2020   Mixed stress and urge urinary incontinence 07/04/2019   Nerve pain 05/16/2019   Myofascial pain 05/16/2019   Subluxation of acromioclavicular joint, right, sequela 03/23/2019   Sickle cell trait ([place])    Labile blood glucose    Diabetic peripheral neuropathy (HCC)    Hypokalemia    Pain in joint of left shoulder    Debility    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   COVID-19    History of anemia    Super obese    Poorly controlled type 2 diabetes mellitus with peripheral neuropathy ([place])    Acute on chronic diastolic (congestive) heart failure ([place])    Hypernatremia    Shock circulatory ([place])  Septic shock (HCC)    Hypotension 01/13/2019   Acute respiratory failure with hypoxemia (HCC) 01/13/2019   Acute respiratory distress syndrome (ARDS) due to COVID-19 virus ([place]) 01/12/2019   Grade I diastolic dysfunction 42/35/3614   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   Dyspnea on exertion 05/19/2016   Other fatigue 05/19/2016   Absolute anemia 04/15/2016   Left leg weakness 10/18/2014   Left leg paresthesias 10/18/2014   Acute left-sided weakness 10/18/2014   Encounter for central line placement 03/08/2014   Taking drug for chronic disease 03/08/2014   H/O insertion of insulin pump 02/18/2014   Long term current use of insulin ([place]) 02/18/2014   Insulin pump in place 02/18/2014   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    Abnormal ballistocardiogram 05/18/2013   Abnormal nuclear stress test 05/18/2013   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   NICM (nonischemic cardiomyopathy) ([place]) 05/01/2013   Insulin dependent type 2 diabetes mellitus ([place]) 04/10/2013   Sex counseling 04/10/2013   Edema leg 04/10/2013   Upper respiratory infection, viral 04/10/2013   Pedal edema 03/29/2013   Pneumonia 12/12/2012   Essential hypertension 12/12/2012   HLD (hyperlipidemia) 12/12/2012   Tonsillar mass 12/01/2012   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   Leukocytosis    Anemia    High cholesterol    Hypertension    Blurred vision 09/11/2011   Foot pain 09/11/2011   Breast pain 09/11/2011   Morbid obesity ([place]) 09/11/2011   Fungal infection of nail 09/11/2011   Avitaminosis D 09/11/2011    PCP: Diamantina Providence PA-C   REFERRING PROVIDER: Courtney Heys, MD   REFERRING DIAG: M79.18 (ICD-10-CM) - Myofascial pain U09.9 (ICD-10-CM) - Long COVID G72.81 (ICD-10-CM) - Intensive care (ICU) myopathy   THERAPY DIAG:  Muscle weakness (generalized)  Difficulty in walking, not elsewhere classified  Unsteadiness on feet  Rationale for Evaluation and Treatment Rehabilitation  ONSET DATE: 09/07/2021   SUBJECTIVE:   SUBJECTIVE STATEMENT:  Pt reports she joined the [place] [employer] about 3 wks ago, but hasn't gone yet but plans to.  No changes since eval.   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 Still has severe difficulty with endurance due to ICU myopathy.     Here for f/u on Long COVID and side effects including incontinence and impaired gait or impaired core strength and endurance.      Broke eyeglasses- needs ot get them fixed.        Plan: PT referral- at [facility] for strengthening and endurance. Call to scheduled appointment [phone]  PAIN:  Are you having pain? Yes: NPRS scale: 5/10 Pain location: generalized Pain  description: aching  Aggravating factors: doing too much of certain things like standing/walking  Relieving factors: not sure, maybe tylenol   PRECAUTIONS: None  WEIGHT BEARING RESTRICTIONS No  FALLS:  Has patient fallen in last 6 months? No  LIVING ENVIRONMENT: Lives with: lives with their family daughter/grandson/son Lives in: House/apartment Stairs: split level home with 15 steps total, also has 15 STE; steps outside have B rails, inside has U rail  Has following equipment at home: shower chair  OCCUPATION: on disability   PLOF: Independent, Independent with basic ADLs, Independent with gait, and Independent with transfers  PATIENT GOALS improve strength and endurance, improve balance, reduce pain if able    OBJECTIVE:   DIAGNOSTIC FINDINGS:   PATIENT SURVEYS:  FOTO 42.8  COGNITION:  Overall cognitive status: Within  functional limits for tasks assessed     SENSATION: Reports hands are numb, also has trigger finger in several fingers; feet are numb too (neuropathy)  EDEMA:    MUSCLE LENGTH:   POSTURE: rounded shoulders, forward head, increased lumbar lordosis, increased thoracic kyphosis, and flexed trunk   PALPATION: Multiple severe trigger points from about T7 and up B   LOWER EXTREMITY ROM:  Active ROM Right eval Left eval  Hip flexion    Hip extension    Hip abduction    Hip adduction    Hip internal rotation    Hip external rotation    Knee flexion    Knee extension    Ankle dorsiflexion    Ankle plantarflexion    Ankle inversion    Ankle eversion     (Blank rows = not tested)  LOWER EXTREMITY MMT:  MMT Right eval Left eval  Hip flexion 3 3-  Hip extension    Hip abduction 3 4  Hip adduction    Hip internal rotation    Hip external rotation    Knee flexion 4+ 3  Knee extension 5 4-  Ankle dorsiflexion 5 5  Ankle plantarflexion    Ankle inversion    Ankle eversion     (Blank rows = not tested)  LOWER EXTREMITY SPECIAL TESTS:     FUNCTIONAL TESTS:  5 times sit to stand: 24 seconds use of BUEs  3 minute walk test: 267f (1 seated rest break after 1170f Berg Balance Scale: 34/56  GAIT: Distance walked: slow and steady but antalgic, reduced ankle DF noted especially on the L, + trendelenburg  Assistive device utilized: None Level of assistance: Complete Independence Comments: easily fatigued     TODAY'S TREATMENT: Pt seen for aquatic therapy today.  Treatment took place in [place] 3.25-4.5 ft [place]  Pt entered/exited the pool via stairs independently with bilat rail.  * with yellow noodle for support:  forward / backward walking, side stepping, high knee marching forward * at wall:  hip abdct x 10 each; squats x 10; heel raises x 10 * yellow hand buoy push downs (at side) x 10;  * walking forward with yellow hand buoys at side * STS at bench with blue step under feet x 8 (short rests in between reps) * calf stretch at wall - switched to heels off of step * return to walking forward, without UE support  * lower back stretch holding wall x 3 reps of 5 sec * side stepping   Pt requires the buoyancy and hydrostatic pressure of water for support, and to offload joints by unweighting joint load by at least 50 % in navel deep water and by at least 75-80% in chest to neck deep water.  Viscosity of the water is needed for resistance of strengthening. Water current perturbations provides challenge to standing balance requiring increased core activation.     PATIENT EDUCATION:  Education details: review of aquatics  Person educated: Patient Education method: ExConsulting civil engineerdemonstration  Education comprehension: verbalized understanding   HOME EXERCISE PROGRAM: TBA  ASSESSMENT:  CLINICAL IMPRESSION: Pt required frequent cues for more upright posture. She required short rest breaks between reps of sit to/form stand. Also reported Rt calf cramp and low  back pain after performing STS.  Calf cramp resolved after stretch.  Pt initially required UE support on floatation device, but was able to walk unsupported at end of session without loss  of balance.  Will benefit from skilled PT services to address functional impairments and optimize overall level of function. Goals are ongoing.    OBJECTIVE IMPAIRMENTS Abnormal gait, cardiopulmonary status limiting activity, decreased activity tolerance, decreased balance, decreased coordination, decreased endurance, decreased knowledge of use of DME, decreased mobility, difficulty walking, decreased strength, increased fascial restrictions, increased muscle spasms, impaired flexibility, impaired UE functional use, postural dysfunction, obesity, and pain.   ACTIVITY LIMITATIONS sitting, standing, stairs, transfers, bed mobility, reach over head, and locomotion level  PARTICIPATION LIMITATIONS: meal prep, cleaning, laundry, shopping, community activity, occupation, and yard work  PERSONAL FACTORS Fitness, Past/current experiences, Time since onset of injury/illness/exacerbation, and 1 comorbidity: long covid with multiple high risk health factors  are also affecting patient's functional outcome.   REHAB POTENTIAL: Good  CLINICAL DECISION MAKING: Evolving/moderate complexity  EVALUATION COMPLEXITY: Moderate   GOALS: Goals reviewed with patient? Yes  SHORT TERM GOALS: Target date: 11/17/2021  Will be compliant with appropriate progressive HEP to include regular/progressive walking program  Baseline: Goal status: INITIAL  2.  Will be able to ambulate at least 231f in 3MWT without a rest break to show improved functional activity tolerance  Baseline:  Goal status: INITIAL  3.  Will demonstrate a 25% improvement in trigger points as a whole to reduce pain  Baseline:  Goal status: INITIAL  4.  Will be able to describe at least 3 methods to conserve energy/for fatigue management in order to improve QOL   Baseline:  Goal status: INITIAL  5.  Will be able to perform 5x STS test in 16 seconds or less to show improved functional activity tolerance  Baseline:  Goal status: INITIAL    LONG TERM GOALS: Target date: 12/29/2021   Will improve MMT by at least 1 grade in all weak groups to show improved functional strength  Baseline:  Goal status: INITIAL  2.  Will be able to ambulate at least 3553fin 3MWT with no rest break to show improved functional activity tolerance/improve community access   Baseline:  Goal status: INITIAL  3.  Will score at least 42 on Berg balance test to show improved functional balance/reduced fall risk  Baseline:  Goal status: INITIAL  4.  Will be able to cook a meal and wash dishes in standing without a rest break in order to show improved strength and functional activity tolerance  Baseline:  Goal status: INITIAL  5.  Will be compliant with appropriate gym or pool based exercise program to maintain functional gains from PT  Baseline:  Goal status: INITIAL    PLAN: PT FREQUENCY: 2x/week  PT DURATION: 12 weeks  PLANNED INTERVENTIONS: Therapeutic exercises, Therapeutic activity, Neuromuscular re-education, Balance training, Gait training, Patient/Family education, Self Care, Joint mobilization, Stair training, DME instructions, Aquatic Therapy, Dry Needling, Electrical stimulation, Spinal mobilization, Cryotherapy, Moist heat, Taping, Ultrasound, Ionotophoresis '4mg'$ /ml Dexamethasone, Manual therapy, and Re-evaluation  PLAN FOR NEXT SESSION: first 8 weeks in water, last 4 weeks on land- focus on strength, balance, functional activity tolerance; consider DN to trigger points (if DN certified clinician feels this is appropriate)  JeKerin PernaPTA 10/14/21 1:52 PM [id number]: [phone] Fax:  [phone]

## 2021-10-19 ENCOUNTER — Encounter (HOSPITAL_BASED_OUTPATIENT_CLINIC_OR_DEPARTMENT_OTHER): Payer: Self-pay | Admitting: Emergency Medicine

## 2021-10-19 ENCOUNTER — Emergency Department (HOSPITAL_BASED_OUTPATIENT_CLINIC_OR_DEPARTMENT_OTHER)
Admission: EM | Admit: 2021-10-19 | Discharge: 2021-10-19 | Disposition: A | Discharge status: Home / Self Care | Payer: Medicare Other | Attending: Emergency Medicine | Admitting: Emergency Medicine

## 2021-10-19 ENCOUNTER — Other Ambulatory Visit: Payer: Self-pay

## 2021-10-19 ENCOUNTER — Emergency Department (HOSPITAL_BASED_OUTPATIENT_CLINIC_OR_DEPARTMENT_OTHER): Payer: Medicare Other

## 2021-10-19 DIAGNOSIS — I110 Hypertensive heart disease with heart failure: Secondary | ICD-10-CM | POA: Diagnosis not present

## 2021-10-19 DIAGNOSIS — Z20822 Contact with and (suspected) exposure to covid-19: Secondary | ICD-10-CM | POA: Insufficient documentation

## 2021-10-19 DIAGNOSIS — R079 Chest pain, unspecified: Secondary | ICD-10-CM | POA: Diagnosis present

## 2021-10-19 DIAGNOSIS — Z7982 Long term (current) use of aspirin: Secondary | ICD-10-CM | POA: Diagnosis not present

## 2021-10-19 DIAGNOSIS — I509 Heart failure, unspecified: Secondary | ICD-10-CM | POA: Insufficient documentation

## 2021-10-19 DIAGNOSIS — Z794 Long term (current) use of insulin: Secondary | ICD-10-CM | POA: Insufficient documentation

## 2021-10-19 DIAGNOSIS — Z79899 Other long term (current) drug therapy: Secondary | ICD-10-CM | POA: Diagnosis not present

## 2021-10-19 DIAGNOSIS — J168 Pneumonia due to other specified infectious organisms: Secondary | ICD-10-CM | POA: Insufficient documentation

## 2021-10-19 DIAGNOSIS — J189 Pneumonia, unspecified organism: Secondary | ICD-10-CM

## 2021-10-19 DIAGNOSIS — E119 Type 2 diabetes mellitus without complications: Secondary | ICD-10-CM | POA: Insufficient documentation

## 2021-10-19 LAB — CBC
HCT: 36.8 % (ref 36.0–46.0)
Hemoglobin: 12.3 g/dL (ref 12.0–15.0)
MCH: 26.4 pg (ref 26.0–34.0)
MCHC: 33.4 g/dL (ref 30.0–36.0)
MCV: 79 fL — ABNORMAL LOW (ref 80.0–100.0)
Platelets: 245 10*3/uL (ref 150–400)
RBC: 4.66 MIL/uL (ref 3.87–5.11)
RDW: 15.2 % (ref 11.5–15.5)
WBC: 10.4 10*3/uL (ref 4.0–10.5)
nRBC: 0 % (ref 0.0–0.2)

## 2021-10-19 LAB — BASIC METABOLIC PANEL
Anion gap: 5 (ref 5–15)
BUN: 22 mg/dL — ABNORMAL HIGH (ref 6–20)
CO2: 28 mmol/L (ref 22–32)
Calcium: 8.9 mg/dL (ref 8.9–10.3)
Chloride: 110 mmol/L (ref 98–111)
Creatinine, Ser: 1.26 mg/dL — ABNORMAL HIGH (ref 0.44–1.00)
GFR, Estimated: 51 mL/min — ABNORMAL LOW (ref >60)
Glucose, Bld: 75 mg/dL (ref 70–99)
Potassium: 3.8 mmol/L (ref 3.5–5.1)
Sodium: 143 mmol/L (ref 135–145)

## 2021-10-19 LAB — RESP PANEL BY RT-PCR (FLU A&B, COVID) ARPGX2
Influenza A by PCR: NEGATIVE
Influenza B by PCR: NEGATIVE
SARS Coronavirus 2 by RT PCR: NEGATIVE

## 2021-10-19 LAB — TROPONIN I (HIGH SENSITIVITY)
Troponin I (High Sensitivity): 10 ng/L (ref <18)
Troponin I (High Sensitivity): 11 ng/L (ref <18)

## 2021-10-19 MED ORDER — DOXYCYCLINE HYCLATE 100 MG PO TABS
100.0000 mg | ORAL_TABLET | Freq: Once | ORAL | Status: AC
  Administered 2021-10-19: 100 mg via ORAL
  Filled 2021-10-19: qty 1

## 2021-10-19 MED ORDER — DOXYCYCLINE HYCLATE 100 MG PO CAPS: 100.0000 mg | ORAL_CAPSULE | Freq: Two times a day (BID) | ORAL | 0 refills | Status: AC

## 2021-10-19 NOTE — ED Triage Notes (Signed)
C/o left sided chest pain "under breast" and shob since 330 pm today. Denies radiation. Describes pain as constant and tight.

## 2021-10-19 NOTE — ED Provider Notes (Addendum)
[facility] Provider Note   CSN: [id number] Arrival date & time: 10/19/21  1821     History  Chief Complaint  Patient presents with   Chest Pain    Kaitlin [name] is a 56 y.o. female.  Patient is a 56 year old female with past medical history of CHF, hypertension, hyperlipidemia, diabetes, and morbid obesity presenting for complaints of chest pain.  Patient midst to chest pain under the left chest that radiates under the breast that began while sitting on the couch with radiation to the jaw.  Patient states pain is heavy and aching in nature with associated shortness of breath during the episode.  States that shortness of breath has resolved however she still has an aching sensation in the chest.  Denies any nausea, vomiting, or diaphoresis.  Patient does admit to contacts with sick grandchildren who have been coughing.  Patient admits to postnasal drip, and minimal cough, but states she is otherwise been feeling well.  Denies any fevers or chills.  The history is provided by the patient. No language interpreter was used.  Chest Pain Associated symptoms: no abdominal pain, no back pain, no cough, no fever, no palpitations, no shortness of breath and no vomiting        Home Medications Prior to Admission medications   Medication Sig Start Date End Date Taking? Authorizing Provider  doxycycline (VIBRAMYCIN) 100 MG capsule Take 1 capsule (100 mg total) by mouth 2 (two) times daily for 7 days. 10/19/21 25/42/70 Yes Campbell Stall P, DO  acetaminophen (TYLENOL) 160 MG/5ML solution Take 20.3 mLs (650 mg total) by mouth every 6 (six) hours as needed for mild pain or moderate pain. 03/09/19   Angiulli, Lavon Paganini, PA-C  albuterol (VENTOLIN HFA) 108 (90 Base) MCG/ACT inhaler One puff every 6 hour as needed 03/09/19   Angiulli, Lavon Paganini, PA-C  ascorbic acid (VITAMIN C) 500 MG tablet Take 1,500 mg by mouth daily.    [provider]  aspirin EC 81 MG tablet Take  1 tablet (81 mg total) by mouth daily. 05/18/16   Minus Breeding, MD  calcium-vitamin D (OSCAL WITH D) 500-200 MG-UNIT tablet Take 2 tablets by mouth daily with breakfast. 03/09/19   Angiulli, Lavon Paganini, PA-C  carvedilol (COREG) 6.25 MG tablet Take 1 tablet (6.25 mg total) by mouth 2 (two) times daily with a meal. 03/09/19   Angiulli, Lavon Paganini, PA-C  Cholecalciferol (VITAMIN D-3) 125 MCG (5000 UT) TABS Take 5,000 Units by mouth daily. 03/09/19   Angiulli, Lavon Paganini, PA-C  Continuous Blood Gluc Receiver (DEXCOM G6 RECEIVER) DEVI Use as directed for continuous glucose monitoring. 04/25/20   [provider]  Continuous Blood Gluc Sensor (DEXCOM G6 SENSOR) MISC Inject 1 sensor to the skin every 10 days for continuous glucose monitoring. 04/25/20   [provider]  Continuous Blood Gluc Transmit (DEXCOM G6 TRANSMITTER) MISC Use as directed for continuous glucose monitoring. Reuse transmitter for 90 days then discard and replace. 04/25/20   [provider]  diclofenac (VOLTAREN) 50 MG EC tablet TAKE 1 TABLET(50 MG) BY MOUTH DAILY FOR ARTHRITIS 02/26/21   Lovorn, Jinny Blossom, MD  diclofenac Sodium (VOLTAREN) 1 % GEL Apply topically. 03/09/19   [provider]  FEROSUL 325 (65 Fe) MG tablet TAKE 1 TABLET(325 MG) BY MOUTH DAILY WITH BREAKFAST 09/11/21   Lovorn, Jinny Blossom, MD  fexofenadine (ALLEGRA) 180 MG tablet Take 180 mg by mouth daily.    [provider]  Insulin Human (INSULIN PUMP) SOLN  Inject into the skin. Novolog    [provider]  insulin lispro (HUMALOG) 100 UNIT/ML injection USE UP TO 200 UNITS DAILY VIA INSULIN PUMP 06/02/20   [provider]  ketoconazole (NIZORAL) 2 % cream Apply 1 application  topically daily. 05/02/19   [provider]  lidocaine (LIDODERM) 5 % Place 1 patch onto the skin daily. Remove & Discard patch within 12 hours or as directed by MD 03/09/19   Angiulli, Lavon Paganini, PA-C  lisinopril (ZESTRIL) 5 MG tablet Take 5 mg by mouth  daily.    [provider]  magnesium oxide (MAG-OX) 400 (241.3 Mg) MG tablet Take 1 tablet (400 mg total) by mouth 2 (two) times daily. Patient taking differently: Take 400 mg by mouth daily. 03/09/19   Angiulli, Lavon Paganini, PA-C  methocarbamol (ROBAXIN) 500 MG tablet Take 1 tablet (500 mg total) by mouth every 8 (eight) hours as needed for muscle spasms. 08/22/20   Lovorn, Jinny Blossom, MD  Multiple Vitamin (MULTIVITAMIN WITH MINERALS) TABS tablet Take 1 tablet by mouth daily.    [provider]  Multiple Vitamins-Minerals (PRESERVISION AREDS) TABS Take 1 tablet by mouth 2 (two) times daily.    [provider]  Nirmatrelvir & Ritonavir 20 x 150 MG & 10 x '100MG'$  TBPK  06/06/20   [provider]  NOVOLOG 100 UNIT/ML injection Inject into the skin. 09/19/19   [provider]  oseltamivir (TAMIFLU) 75 MG capsule Take 1 capsule (75 mg total) by mouth every 12 (twelve) hours. 11/15/20   Raspet, Derry Skill, PA-C  potassium chloride SA (KLOR-CON) 20 MEQ tablet Take 20 mEq by mouth 2 (two) times daily.    [provider]  rosuvastatin (CRESTOR) 40 MG tablet Take 1 tablet (40 mg total) by mouth daily. 03/09/19   Angiulli, Lavon Paganini, PA-C  Semaglutide,0.25 or 0.'5MG'$ /DOS, (OZEMPIC, 0.25 OR 0.5 MG/DOSE,) 2 MG/1.5ML SOPN Inject 0.25 mg into the skin every 7 (seven) days.    [provider]  tiZANidine (ZANAFLEX) 2 MG tablet Take 2 mg by mouth 3 (three) times daily. 04/14/20   [provider]  torsemide (DEMADEX) 20 MG tablet Take 1 tablet (20 mg total) by mouth 2 (two) times daily. 03/09/19   Angiulli, Lavon Paganini, PA-C  triamcinolone acetonide (TRIESENCE) 40 MG/ML SUSP Inject into the articular space. 03/10/20   [provider]      Allergies    Amoxicillin-pot clavulanate and Ibuprofen    Review of Systems   Review of Systems  Constitutional:  Negative for chills and fever.  HENT:  Negative for ear pain and sore throat.   Eyes:  Negative for pain and  visual disturbance.  Respiratory:  Negative for cough and shortness of breath.   Cardiovascular:  Positive for chest pain. Negative for palpitations.  Gastrointestinal:  Negative for abdominal pain and vomiting.  Genitourinary:  Negative for dysuria and hematuria.  Musculoskeletal:  Negative for arthralgias and back pain.  Skin:  Negative for color change and rash.  Neurological:  Negative for seizures and syncope.  All other systems reviewed and are negative.   Physical Exam Updated Vital Signs BP 139/89 (BP Location: Right Arm)   Pulse 88   Temp 98.9 F (37.2 C) (Oral)   Resp 20   Ht '5\' 7"'$  (1.702 m)   Wt (!) 177.4 kg   LMP 06/17/2012   SpO2 97%   BMI 61.24 kg/m  Physical Exam Vitals and nursing note reviewed.  Constitutional:  General: She is not in acute distress.    Appearance: She is well-developed.  HENT:     Head: Normocephalic and atraumatic.  Eyes:     Conjunctiva/sclera: Conjunctivae normal.  Cardiovascular:     Rate and Rhythm: Normal rate and regular rhythm.     Heart sounds: No murmur heard. Pulmonary:     Effort: Pulmonary effort is normal. No respiratory distress.     Breath sounds: Normal breath sounds.  Abdominal:     Palpations: Abdomen is soft.     Tenderness: There is no abdominal tenderness.  Musculoskeletal:        General: No swelling.     Cervical back: Neck supple.  Skin:    General: Skin is warm and dry.     Capillary Refill: Capillary refill takes less than 2 seconds.  Neurological:     Mental Status: She is alert.  Psychiatric:        Mood and Affect: Mood normal.     ED Results / Procedures / Treatments   Labs (all labs ordered are listed, but only abnormal results are displayed) Labs Reviewed  BASIC METABOLIC PANEL - Abnormal; Notable for the following components:      Result Value   BUN 22 (*)    Creatinine, Ser 1.26 (*)    GFR, Estimated 51 (*)    All other components within normal limits  CBC - Abnormal; Notable for  the following components:   MCV 79.0 (*)    All other components within normal limits  RESP PANEL BY RT-PCR (FLU A&B, COVID) ARPGX2  TROPONIN I (HIGH SENSITIVITY)  TROPONIN I (HIGH SENSITIVITY)    EKG EKG Interpretation  Date/Time:  Monday October 19 2021 18:37:52 EDT Ventricular Rate:  90 PR Interval:  140 QRS Duration: 98 QT Interval:  394 QTC Calculation: 481 R Axis:   -20 Text Interpretation: Sinus rhythm with Premature supraventricular complexes Confirmed by Campbell Stall ([id number]) on 39/07/6732 8:08:58 PM  Radiology DG Chest 2 View  Result Date: 10/19/2021 CLINICAL DATA:  Chest pain and shortness of breath. Left-sided chest pain and shortness of breath since 03/30 today. EXAM: CHEST - 2 VIEW COMPARISON:  11/25/2020 FINDINGS: Postoperative changes in the mediastinum. Shallow inspiration. Heart size and pulmonary vascularity are normal. Interstitial pattern demonstrated in the lungs which may represent edema or pneumonia. No focal consolidation. No pleural effusions. No pneumothorax. Mediastinal contours appear intact. IMPRESSION: Mild diffuse interstitial pattern to the lungs suggesting edema or pneumonia. Electronically Signed   By: Lucienne Capers M.D.   On: 10/19/2021 19:04    Procedures Procedures    Medications Ordered in ED Medications  doxycycline (VIBRA-TABS) tablet 100 mg (has no administration in time range)    ED Course/ Medical Decision Making/ A&P                           Medical Decision Making Amount and/or Complexity of Data Reviewed Labs: ordered. Radiology: ordered.  Risk Prescription drug management.   75:96 PM 56 year old female with past medical history of CHF, hypertension, hyperlipidemia, diabetes, and morbid obesity presenting for complaints of chest pain.  Is alert and oriented 3, no acute distress, afebrile, stable vital signs.  Physical exam demonstrates equal bilateral breath sounds with no adventitious lung sounds.  No rashes.  No  reproducible tenderness.  EKG is interpreted by myself on 10/19/2021 at 8:32 PM demonstrates normal sinus rhythm.  No ST segment changes.   The patient's chest  pain is not suggestive of pulmonary embolus, cardiac ischemia, aortic dissection, pericarditis, myocarditis, pulmonary embolism, pneumothorax, pneumonia, Zoster, or esophageal perforation, or other serious etiology.  Historically not abrupt in onset, tearing or ripping, pulses symmetric. EKG nonspecific for ischemia/infarction. No dysrhythmias, brugada, WPW, prolonged QT noted. Troponin negative x2.   CXR reviewed and concerning for pleural effusions versus pneumonia.  Patient does admit to a productive cough and postnasal drip with contact with sick grandchildren.  COVID-19 and influenza PCR negative.  Will treat for pneumonia.  Patient states otherwise her fluid status has been well.  Denies any significant lower extremity swelling.  States she sleeps on an incline but has always been that way with no significant changes.   Recommended for prompt return to emergency department if chest pain worsens in any way.  Recommended for close follow-up with her primary care physician given her risk factors for heart disease.  Patient in no distress and overall condition improved here in the ED. Detailed discussions were had with the patient regarding current findings, and need for close f/u with PCP or on call doctor. The patient has been instructed to return immediately if the symptoms worsen in any way for re-evaluation. Patient verbalized understanding and is in agreement with current care plan. All questions answered prior to discharge.          Final Clinical Impression(s) / ED Diagnoses Final diagnoses:  Pneumonia due to infectious organism, unspecified laterality, unspecified part of lung    Rx / DC Orders ED Discharge Orders          Ordered    doxycycline (VIBRAMYCIN) 100 MG capsule  2 times daily        10/19/21 2127               Lianne Cure, DO 24/26/83 [id number]    Lianne Cure, DO 22/29/79 2127

## 2021-10-19 NOTE — Discharge Instructions (Addendum)
Today you were evaluated in the emergency department for chest pain.  Your EKG and troponin levels which are your heart markers demonstrate no signs of a heart attack.  I still recommend you follow-up with your primary care physician in the next 5 to 7 days for reevaluation due to your risk factors for heart disease including hypertension, hyperlipidemia, and diabetes.  Your chest x-ray demonstrated a pneumonia.  Please take antibiotics as prescribed, rest, and stay hydrated.

## 2021-10-20 ENCOUNTER — Encounter (HOSPITAL_BASED_OUTPATIENT_CLINIC_OR_DEPARTMENT_OTHER): Payer: Self-pay

## 2021-10-20 ENCOUNTER — Ambulatory Visit (HOSPITAL_BASED_OUTPATIENT_CLINIC_OR_DEPARTMENT_OTHER): Payer: Medicare Other | Admitting: Physical Therapy

## 2021-10-28 ENCOUNTER — Ambulatory Visit (HOSPITAL_BASED_OUTPATIENT_CLINIC_OR_DEPARTMENT_OTHER): Payer: Medicare Other | Admitting: Physical Therapy

## 2021-10-28 ENCOUNTER — Encounter (HOSPITAL_BASED_OUTPATIENT_CLINIC_OR_DEPARTMENT_OTHER): Payer: Self-pay

## 2021-10-30 ENCOUNTER — Ambulatory Visit (HOSPITAL_BASED_OUTPATIENT_CLINIC_OR_DEPARTMENT_OTHER): Payer: Medicare Other | Admitting: Physical Therapy

## 2021-11-01 NOTE — Therapy (Incomplete)
OUTPATIENT PHYSICAL THERAPY LOWER EXTREMITY EVALUATION   Patient Name: Kaitlin [name] MRN: [id number] DOB:[birth year], 56 y.o., female Today's Date: 11/01/2021     Past Medical History:  Diagnosis Date   Anemia    Bronchitis    Cataracts, bilateral 01/12/2018   CHF (congestive heart failure) (HCC)    Diabetes mellitus    High cholesterol    Hypertension    takes medicine to protect kidneys, does not have HTN   Leukocytosis    Morbid obesity ([place]) 09/11/2011   BMI 57   Neuropathy 01/12/2018   NICM (nonischemic cardiomyopathy) ([place]) 05/01/2013   Overview:  Last Assessment & Plan:  euvolemic on physical exam.    Obesity    Sickle cell trait ([place])    Thymoma    Past Surgical History:  Procedure Laterality Date   COLONOSCOPY N/A 06/21/2017   Procedure: COLONOSCOPY;  Surgeon: Ronnette Juniper, MD;  Location: [facility];  Service: Gastroenterology;  Laterality: N/A;   LEFT HEART CATHETERIZATION WITH CORONARY ANGIOGRAM N/A 05/25/2013   Procedure: LEFT HEART CATHETERIZATION WITH CORONARY ANGIOGRAM;  Surgeon: Troy Sine, MD;  Location: [facility] CATH LAB;  Service: Cardiovascular;  Laterality: N/A;   LYMPH NODE BIOPSY Right 11/30/2012   Procedure: RIGHT TONSIL BIOPSY WITH FRESH FROZEN ANALYSIS;  Surgeon: Jodi Marble, MD;  Location: [place];  Service: ENT;  Laterality: Right;   MEDIASTERNOTOMY N/A 01/12/2013   Procedure: MEDIAN STERNOTOMY;  Surgeon: Gaye Pollack, MD;  Location: [place];  Service: Thoracic;  Laterality: N/A;   MEDIASTINOTOMY CHAMBERLAIN MCNEIL    Right 11/06/2012   Procedure: MEDIASTINOTOMY CHAMBERLAIN MCNEIL PROCEDURE;  Surgeon: Gaye Pollack, MD;  Location: [place];  Service: Thoracic;  Laterality: Right;   POLYPECTOMY  06/21/2017   Procedure: POLYPECTOMY;  Surgeon: Ronnette Juniper, MD;  Location: [facility];  Service: Gastroenterology;;   RESECTION OF A THYMOMA N/A 01/12/2013   Procedure: RESECTION OF A THYMOMA;  Surgeon: Gaye Pollack, MD;  Location: [place];  Service: Thoracic;   Laterality: N/A;   TONSILLECTOMY     TUBAL LIGATION     Patient Active Problem List   Diagnosis Date Noted   Long COVID 01/02/2020   Mixed stress and urge urinary incontinence 07/04/2019   Nerve pain 05/16/2019   Myofascial pain 05/16/2019   Subluxation of acromioclavicular joint, right, sequela 03/23/2019   Sickle cell trait ([place])    Labile blood glucose    Diabetic peripheral neuropathy ([place])    Hypokalemia    Pain in joint of left shoulder    Debility    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   COVID-19    History of anemia    Super obese    Poorly controlled type 2 diabetes mellitus with peripheral neuropathy ([place])    Acute on chronic diastolic (congestive) heart failure ([place])    Hypernatremia    Shock circulatory ([place])    Septic shock ([place])    Hypotension 01/13/2019   Acute respiratory failure with hypoxemia ([place]) 01/13/2019   Acute respiratory distress syndrome (ARDS) due to COVID-19 virus ([place]) 01/12/2019   Grade I diastolic dysfunction 29/52/8413   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   Dyspnea on exertion 05/19/2016   Other fatigue 05/19/2016   Absolute anemia 04/15/2016   Left leg weakness 10/18/2014   Left leg paresthesias 10/18/2014   Acute left-sided weakness 10/18/2014   Encounter for central line placement 03/08/2014   Taking drug for chronic disease 03/08/2014   H/O insertion of insulin pump 02/18/2014   Long term  current use of insulin ([place]) 02/18/2014   Insulin pump in place 02/18/2014   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   Abnormal ballistocardiogram 05/18/2013   Abnormal nuclear stress test 05/18/2013   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   NICM (nonischemic cardiomyopathy) ([place]) 05/01/2013   Insulin dependent type 2 diabetes mellitus ([place]) 04/10/2013   Sex counseling 04/10/2013   Edema leg 04/10/2013   Upper respiratory infection, viral 04/10/2013   Pedal edema 03/29/2013   Pneumonia 12/12/2012   Essential hypertension 12/12/2012   HLD  (hyperlipidemia) 12/12/2012   Tonsillar mass 12/01/2012   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  Rationale for Evaluation and Treatment Rehabilitation  ONSET DATE: 09/07/2021   SUBJECTIVE:   SUBJECTIVE STATEMENT:  Pt reports she joined [facility] [employer] [place] [employer] about 3 wks ago, but hasn't gone yet but plans to.  No changes since eval.   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 Still has severe difficulty with endurance due to ICU myopathy.     Here for f/u on Long COVID and side effects including incontinence and impaired gait or impaired core strength and endurance.      Broke eyeglasses- needs ot get them fixed.        Plan: PT referral- at [facility] for strengthening and endurance. Call to scheduled appointment [phone]  PAIN:  Are you having pain? Yes: NPRS scale: 5/10 Pain location: generalized Pain description: aching  Aggravating factors: doing too much of certain things like standing/walking  Relieving factors: not sure, maybe tylenol   PRECAUTIONS: None  WEIGHT BEARING RESTRICTIONS No  FALLS:  Has patient fallen in last 6 months? No  LIVING ENVIRONMENT: Lives with: lives with their family daughter/grandson/son Lives in: House/apartment Stairs: split level home with 15 steps total, also has 15 STE; steps outside have B rails, inside has U rail  Has following equipment at home: shower  chair  OCCUPATION: on disability   PLOF: Independent, Independent with basic ADLs, Independent with gait, and Independent with transfers  PATIENT GOALS improve strength and endurance, improve balance, reduce pain if able    OBJECTIVE:   DIAGNOSTIC FINDINGS:   PATIENT SURVEYS:  FOTO 42.8  COGNITION:  Overall cognitive status: Within functional limits for tasks assessed     SENSATION: Reports hands are numb, also has trigger finger in several fingers; feet are numb too (neuropathy)  EDEMA:    MUSCLE LENGTH:   POSTURE: rounded shoulders, forward head, increased lumbar lordosis, increased thoracic kyphosis, and flexed trunk   PALPATION: Multiple severe trigger points from about T7 and up B   LOWER EXTREMITY ROM:  Active ROM Right eval Left eval  Hip flexion    Hip extension    Hip abduction    Hip adduction    Hip internal rotation    Hip external rotation    Knee flexion    Knee extension  Ankle dorsiflexion    Ankle plantarflexion    Ankle inversion    Ankle eversion     (Blank rows = not tested)  LOWER EXTREMITY MMT:  MMT Right eval Left eval  Hip flexion 3 3-  Hip extension    Hip abduction 3 4  Hip adduction    Hip internal rotation    Hip external rotation    Knee flexion 4+ 3  Knee extension 5 4-  Ankle dorsiflexion 5 5  Ankle plantarflexion    Ankle inversion    Ankle eversion     (Blank rows = not tested)  LOWER EXTREMITY SPECIAL TESTS:    FUNCTIONAL TESTS:  5 times sit to stand: 24 seconds use of BUEs  3 minute walk test: 268f (1 seated rest break after 1158f Berg Balance Scale: 34/56  GAIT: Distance walked: slow and steady but antalgic, reduced ankle DF noted especially on the L, + trendelenburg  Assistive device utilized: None Level of assistance: Complete Independence Comments: easily fatigued     TODAY'S TREATMENT: Pt seen for aquatic therapy today.  Treatment took place in water 3.25-4.5 ft in depth at the  [place] of water was 92.  Pt entered/exited the pool via stairs independently with bilat rail.  * with yellow noodle for support:  forward / backward walking, side stepping, high knee marching forward * at wall:  hip abdct x 10 each; squats x 10; heel raises x 10 * yellow hand buoy push downs (at side) x 10;  * walking forward with yellow hand buoys at side * STS at bench with blue step under feet x 8 (short rests in between reps) * calf stretch at wall - switched to heels off of step * return to walking forward, without UE support  * lower back stretch holding wall x 3 reps of 5 sec * side stepping   Pt requires the buoyancy and hydrostatic pressure of water for support, and to offload joints by unweighting joint load by at least 50 % in navel deep water and by at least 75-80% in chest to neck deep water.  Viscosity of the water is needed for resistance of strengthening. Water current perturbations provides challenge to standing balance requiring increased core activation.     PATIENT EDUCATION:  Education details: review of aquatics  Person educated: Patient Education method: ExConsulting civil engineerdemonstration  Education comprehension: verbalized understanding   HOME EXERCISE PROGRAM: TBA  ASSESSMENT:  CLINICAL IMPRESSION: Pt required frequent cues for more upright posture. She required short rest breaks between reps of sit to/form stand. Also reported Rt calf cramp and low back pain after performing STS.  Calf cramp resolved after stretch.  Pt initially required UE support on floatation device, but was able to walk unsupported at end of session without loss of balance.  Will benefit from skilled PT services to address functional impairments and optimize overall level of function. Goals are ongoing.    OBJECTIVE IMPAIRMENTS Abnormal gait, cardiopulmonary status limiting activity, decreased activity tolerance, decreased balance, decreased coordination, decreased  endurance, decreased knowledge of use of DME, decreased mobility, difficulty walking, decreased strength, increased fascial restrictions, increased muscle spasms, impaired flexibility, impaired UE functional use, postural dysfunction, obesity, and pain.   ACTIVITY LIMITATIONS sitting, standing, stairs, transfers, bed mobility, reach over head, and locomotion level  PARTICIPATION LIMITATIONS: meal prep, cleaning, laundry, shopping, community activity, occupation, and yard work  PERSONAL FACTORS Fitness, Past/current experiences, Time since onset of injury/illness/exacerbation, and 1 comorbidity: long covid  with multiple high risk health factors  are also affecting patient's functional outcome.   REHAB POTENTIAL: Good  CLINICAL DECISION MAKING: Evolving/moderate complexity  EVALUATION COMPLEXITY: Moderate   GOALS: Goals reviewed with patient? Yes  SHORT TERM GOALS: Target date: 11/17/2021  Will be compliant with appropriate progressive HEP to include regular/progressive walking program  Baseline: Goal status: INITIAL  2.  Will be able to ambulate at least 27f in 3MWT without a rest break to show improved functional activity tolerance  Baseline:  Goal status: INITIAL  3.  Will demonstrate a 25% improvement in trigger points as a whole to reduce pain  Baseline:  Goal status: INITIAL  4.  Will be able to describe at least 3 methods to conserve energy/for fatigue management in order to improve QOL  Baseline:  Goal status: INITIAL  5.  Will be able to perform 5x STS test in 16 seconds or less to show improved functional activity tolerance  Baseline:  Goal status: INITIAL    LONG TERM GOALS: Target date: 12/29/2021   Will improve MMT by at least 1 grade in all weak groups to show improved functional strength  Baseline:  Goal status: INITIAL  2.  Will be able to ambulate at least 3532fin 3MWT with no rest break to show improved functional activity tolerance/improve community  access   Baseline:  Goal status: INITIAL  3.  Will score at least 42 on Berg balance test to show improved functional balance/reduced fall risk  Baseline:  Goal status: INITIAL  4.  Will be able to cook a meal and wash dishes in standing without a rest break in order to show improved strength and functional activity tolerance  Baseline:  Goal status: INITIAL  5.  Will be compliant with appropriate gym or pool based exercise program to maintain functional gains from PT  Baseline:  Goal status: INITIAL    PLAN: PT FREQUENCY: 2x/week  PT DURATION: 12 weeks  PLANNED INTERVENTIONS: Therapeutic exercises, Therapeutic activity, Neuromuscular re-education, Balance training, Gait training, Patient/Family education, Self Care, Joint mobilization, Stair training, DME instructions, Aquatic Therapy, Dry Needling, Electrical stimulation, Spinal mobilization, Cryotherapy, Moist heat, Taping, Ultrasound, Ionotophoresis '4mg'$ /ml Dexamethasone, Manual therapy, and Re-evaluation  PLAN FOR NEXT SESSION: first 8 weeks in water, last 4 weeks on land- focus on strength, balance, functional activity tolerance; consider DN to trigger points (if DN certified clinician feels this is appropriate)  MaStanton KidneyFTharon AquasZiemba MPT 11/01/21 6:48 PM [id number] [id number]: [phone] Fax:  [phone]

## 2021-11-03 ENCOUNTER — Ambulatory Visit (HOSPITAL_BASED_OUTPATIENT_CLINIC_OR_DEPARTMENT_OTHER): Payer: Medicare Other | Admitting: Physical Therapy

## 2021-11-05 ENCOUNTER — Other Ambulatory Visit: Payer: Self-pay

## 2021-11-05 ENCOUNTER — Ambulatory Visit (HOSPITAL_BASED_OUTPATIENT_CLINIC_OR_DEPARTMENT_OTHER): Payer: Medicare Other | Admitting: Physical Therapy

## 2021-11-05 DIAGNOSIS — M7918 Myalgia, other site: Secondary | ICD-10-CM

## 2021-11-06 ENCOUNTER — Encounter: Payer: Self-pay | Admitting: Physical Medicine and Rehabilitation

## 2021-11-06 ENCOUNTER — Encounter
Payer: Medicare Other | Attending: Physical Medicine and Rehabilitation | Admitting: Physical Medicine and Rehabilitation

## 2021-11-06 VITALS — BP 114/77 | HR 105 | Temp 99.5°F | Ht 67.0 in | Wt 387.0 lb

## 2021-11-06 DIAGNOSIS — N3946 Mixed incontinence: Secondary | ICD-10-CM | POA: Insufficient documentation

## 2021-11-06 DIAGNOSIS — M7918 Myalgia, other site: Secondary | ICD-10-CM | POA: Diagnosis not present

## 2021-11-06 DIAGNOSIS — U099 Post covid-19 condition, unspecified: Secondary | ICD-10-CM | POA: Diagnosis present

## 2021-11-06 MED ORDER — LIDOCAINE HCL 1 % IJ SOLN
5.0000 mL | Freq: Once | INTRAMUSCULAR | Status: AC
  Administered 2021-11-06: 5 mL

## 2021-11-06 NOTE — Addendum Note (Signed)
Addended by: Franchot Gallo on: 11/06/2021 12:05 PM   Modules accepted: Orders

## 2021-11-06 NOTE — Patient Instructions (Signed)
Plan: Patient here for trigger point injections for myofascial pain  Consent done and on chart.  Cleaned areas with alcohol and injected using a 27 gauge 1.5 inch needle  Injected 5 cc wasted 1 cc Using 1% Lidocaine with no EPI  Upper traps B/L  Levators- B/L  Posterior scalenes Middle scalenes- B/L  Splenius Capitus B/:L  Pectoralis Major Rhomboids B/L x2 Infraspinatus Teres Major/minor Thoracic paraspinals B/L x2 Lumbar paraspinals Other injections-    Patient's level of pain prior was 5/10 Current level of pain after injections is still 5/10- usually has results in car.   There was no bleeding or complications.  Patient was advised to drink a lot of water on day after injections to flush system Will have increased soreness for 12-48 hours after injections.  Can use Lidocaine patches the day AFTER injections Can use theracane on day of injections in places didn't inject Can use heating pad 4-6 hours AFTER injections    2. Con't PT- restart/start back  3. Con't incontinence supplies. For stress/urge incontinence   4. F/U in 2 months

## 2021-11-06 NOTE — Addendum Note (Signed)
Addended by: Franchot Gallo on: 11/06/2021 12:00 PM   Modules accepted: Orders

## 2021-11-06 NOTE — Progress Notes (Signed)
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 Still has severe difficulty with endurance due to ICU myopathy.     Here for f/u on Long COVID and side effects including incontinence and impaired gait or impaired core strength and endurance.     BMI 60.6  Went through ED earlier in month for pneumonia-  Given PO ABX- doing OK now.  Was still having some pleuritic chest pain and a cough, but much, much better.   Got eyeglasses fixed.   Hasn't seen PT yet for endurance- went to 1-2 visits, then got sick with URI early September, than progressed to Pneumonia, so obviously couldn't go.   Getting supplies for incontinence issues.   Having trouble getting Ozempic and insulin and test strips- has Rx but not available at pharmacy.     Plan: Patient here for trigger point injections for myofascial pain  Consent done and on chart.  Cleaned areas with alcohol and injected using a 27 gauge 1.5 inch needle  Injected 5 cc wasted 1 cc Using 1% Lidocaine with no EPI  Upper traps B/L  Levators- B/L  Posterior scalenes Middle scalenes- B/L  Splenius Capitus B/:L  Pectoralis Major Rhomboids B/L x2 Infraspinatus Teres Major/minor Thoracic paraspinals B/L x2 Lumbar paraspinals Other injections-    Patient's level of pain prior was 5/10 Current level of pain after injections is still 5/10- usually has results in car.   There was no bleeding or complications.  Patient was advised to drink a lot of water on day after injections to flush system Will have increased soreness for 12-48 hours after injections.  Can use Lidocaine patches the day AFTER injections Can use theracane on day of injections in places didn't inject Can use heating pad 4-6 hours AFTER injections    2. Con't PT- restart/start back  3. Con't incontinence supplies. For stress/urge incontinence   4. F/U in 2 months

## 2021-11-06 NOTE — Addendum Note (Signed)
Addended by: Franchot Gallo on: 11/06/2021 12:49 PM   Modules accepted: Orders

## 2021-11-10 ENCOUNTER — Ambulatory Visit (HOSPITAL_BASED_OUTPATIENT_CLINIC_OR_DEPARTMENT_OTHER): Payer: Medicare Other | Admitting: Physical Therapy

## 2021-11-10 DIAGNOSIS — M6281 Muscle weakness (generalized): Secondary | ICD-10-CM | POA: Diagnosis not present

## 2021-11-10 DIAGNOSIS — R293 Abnormal posture: Secondary | ICD-10-CM

## 2021-11-10 DIAGNOSIS — R262 Difficulty in walking, not elsewhere classified: Secondary | ICD-10-CM

## 2021-11-10 NOTE — Therapy (Signed)
OUTPATIENT PHYSICAL THERAPY LOWER EXTREMITY EVALUATION   Patient Name: Kaitlin [name] MRN: [id number] DOB:[birth year], 56 y.o., female Today's Date: 11/10/2021     Past Medical History:  Diagnosis Date   Anemia    Bronchitis    Cataracts, bilateral 01/12/2018   CHF (congestive heart failure) (HCC)    Diabetes mellitus    High cholesterol    Hypertension    takes medicine to protect kidneys, does not have HTN   Leukocytosis    Morbid obesity ([place]) 09/11/2011   BMI 57   Neuropathy 01/12/2018   NICM (nonischemic cardiomyopathy) ([place]) 05/01/2013   Overview:  Last Assessment & Plan:  euvolemic on physical exam.    Obesity    Sickle cell trait ([place])    Thymoma    Past Surgical History:  Procedure Laterality Date   COLONOSCOPY N/A 06/21/2017   Procedure: COLONOSCOPY;  Surgeon: Ronnette Juniper, MD;  Location: [facility];  Service: Gastroenterology;  Laterality: N/A;   LEFT HEART CATHETERIZATION WITH CORONARY ANGIOGRAM N/A 05/25/2013   Procedure: LEFT HEART CATHETERIZATION WITH CORONARY ANGIOGRAM;  Surgeon: Troy Sine, MD;  Location[facility] C[facility] LAB;  Service: Cardiovascular;  Laterality: N/A;   LYMPH NODE BIOPSY Right 11/30/2012   Procedure: RIGHT TONSIL BIOPSY WITH FRESH FROZEN ANALYSIS;  Surgeon: Jodi Marble, MD;  Location: [place];  Service: ENT;  Laterality: Right;   MEDIASTERNOTOMY N/A 01/12/2013   Procedure: MEDIAN STERNOTOMY;  Surgeon: Gaye Pollack, MD;  Location: [place];  Service: Thoracic;  Laterality: N/A;   MEDIASTINOTOMY CHAMBERLAIN MCNEIL    Right 11/06/2012   Procedure: MEDIASTINOTOMY CHAMBERLAIN MCNEIL PROCEDURE;  Surgeon: Gaye Pollack, MD;  Location: [place];  Service: Thoracic;  Laterality: Right;   POLYPECTOMY  06/21/2017   Procedure: POLYPECTOMY;  Surgeon: Ronnette Juniper, MD;  Location: [facility];  Service: Gastroenterology;;   RESECTION OF A THYMOMA N/A 01/12/2013   Procedure: RESECTION OF A THYMOMA;  Surgeon: Gaye Pollack, MD;  Location: [place];  Service: Thoracic;   Laterality: N/A;   TONSILLECTOMY     TUBAL LIGATION     Patient Active Problem List   Diagnosis Date Noted   Long COVID 01/02/2020   Mixed stress and urge urinary incontinence 07/04/2019   Nerve pain 05/16/2019   Myofascial pain 05/16/2019   Subluxation of acromioclavicular joint, right, sequela 03/23/2019   Sickle cell trait ([place])    Labile blood glucose    Diabetic peripheral neuropathy ([place])    Hypokalemia    Pain in joint of left shoulder    Debility    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   COVID-19    History of anemia    Super obese    Poorly controlled type 2 diabetes mellitus with peripheral neuropathy ([place])    Acute on chronic diastolic (congestive) heart failure ([place])    Hypernatremia    Shock circulatory ([place])    Septic shock ([place])    Hypotension 01/13/2019   Acute respiratory failure with hypoxemia ([place]) 01/13/2019   Acute respiratory distress syndrome (ARDS) due to COVID-19 virus ([place]) 01/12/2019   Grade I diastolic dysfunction 37/90/2409   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   Dyspnea on exertion 05/19/2016   Other fatigue 05/19/2016   Absolute anemia 04/15/2016   Left leg weakness 10/18/2014   Left leg paresthesias 10/18/2014   Acute left-sided weakness 10/18/2014   Encounter for central line placement 03/08/2014   Taking drug for chronic disease 03/08/2014   H/O insertion of insulin pump 02/18/2014   Long term  current use of insulin ([place]) 02/18/2014   Insulin pump in place 02/18/2014   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   Abnormal ballistocardiogram 05/18/2013   Abnormal nuclear stress test 05/18/2013   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   NICM (nonischemic cardiomyopathy) ([place]) 05/01/2013   Insulin dependent type 2 diabetes mellitus ([place]) 04/10/2013   Sex counseling 04/10/2013   Edema leg 04/10/2013   Upper respiratory infection, viral 04/10/2013   Pedal edema 03/29/2013   Pneumonia 12/12/2012   Essential hypertension 12/12/2012   HLD  (hyperlipidemia) 12/12/2012   Tonsillar mass 12/01/2012   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  Rationale for Evaluation and Treatment Rehabilitation  ONSET DATE: 09/07/2021   SUBJECTIVE:   SUBJECTIVE STATEMENT:  Pt reports she joined the [employer] [place] [facility] about 3 wks ago, but hasn't gone yet but plans to.  No changes since eval.   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 Still has severe difficulty with endurance due to ICU myopathy.     Here for f/u on Long COVID and side effects including incontinence and impaired gait or impaired core strength and endurance.      Broke eyeglasses- needs ot get them fixed.        Plan: PT referral- at [facility] for strengthening and endurance. Call to scheduled appointment [phone]  PAIN:  Are you having pain? Yes: NPRS scale: 5/10 Pain location: generalized Pain description: aching  Aggravating factors: doing too much of certain things like standing/walking  Relieving factors: not sure, maybe tylenol   PRECAUTIONS: None  WEIGHT BEARING RESTRICTIONS No  FALLS:  Has patient fallen in last 6 months? No  LIVING ENVIRONMENT: Lives with: lives with their family daughter/grandson/son Lives in: House/apartment Stairs: split level home with 15 steps total, also has 15 STE; steps outside have B rails, inside has U rail  Has following equipment at home: shower  chair  OCCUPATION: on disability   PLOF: Independent, Independent with basic ADLs, Independent with gait, and Independent with transfers  PATIENT GOALS improve strength and endurance, improve balance, reduce pain if able    OBJECTIVE:   DIAGNOSTIC FINDINGS:   PATIENT SURVEYS:  FOTO 42.8  COGNITION:  Overall cognitive status: Within functional limits for tasks assessed     SENSATION: Reports hands are numb, also has trigger finger in several fingers; feet are numb too (neuropathy)  EDEMA:    MUSCLE LENGTH:   POSTURE: rounded shoulders, forward head, increased lumbar lordosis, increased thoracic kyphosis, and flexed trunk   PALPATION: Multiple severe trigger points from about T7 and up B   LOWER EXTREMITY ROM:  Active ROM Right eval Left eval  Hip flexion    Hip extension    Hip abduction    Hip adduction    Hip internal rotation    Hip external rotation    Knee flexion    Knee extension  Ankle dorsiflexion    Ankle plantarflexion    Ankle inversion    Ankle eversion     (Blank rows = not tested)  LOWER EXTREMITY MMT:  MMT Right eval Left eval  Hip flexion 3 3-  Hip extension    Hip abduction 3 4  Hip adduction    Hip internal rotation    Hip external rotation    Knee flexion 4+ 3  Knee extension 5 4-  Ankle dorsiflexion 5 5  Ankle plantarflexion    Ankle inversion    Ankle eversion     (Blank rows = not tested)  LOWER EXTREMITY SPECIAL TESTS:    FUNCTIONAL TESTS:  5 times sit to stand: 24 seconds use of BUEs  3 minute walk test: 285f (1 seated rest break after 1167f Berg Balance Scale: 34/56  GAIT: Distance walked: slow and steady but antalgic, reduced ankle DF noted especially on the L, + trendelenburg  Assistive device utilized: None Level of assistance: Complete Independence Comments: easily fatigued     TODAY'S TREATMENT: Self care:   Pt edu on building toleration to activity walking to/from pool as well as then being able  to engage in aquatic session. Pt with some slight upper right lob discomfort in which MD is aware. Discussed wc transport to and/or from pool with aquatic intervention between until improved toleration to combine. Lung sounds negative for any obvious dysfunction. Pulse and sats monitored with amb training.  Gait:with SBA,  pt pushing WC - 100 ft, 114 bmp and 97% spO2;  147 ft : 122 bpm and 95% spO2.  Cues to pace herself. Increased lateral trunk lean with fatigue.  Pt reported 5/10 exertion during first gait trial; reported increased exertion and fatigue after 2nd trial.    Last session: Pt seen for aquatic therapy today.  Treatment took place in water 3.25-4.5 ft [place] was 92.  Pt entered/exited the pool via stairs independently with bilat rail.  * with yellow noodle for support:  forward / backward walking, side stepping, high knee marching forward * at wall:  hip abdct x 10 each; squats x 10; heel raises x 10 * yellow hand buoy push downs (at side) x 10;  * walking forward with yellow hand buoys at side * STS at bench with blue step under feet x 8 (short rests in between reps) * calf stretch at wall - switched to heels off of step * return to walking forward, without UE support  * lower back stretch holding wall x 3 reps of 5 sec * side stepping   Pt requires the buoyancy and hydrostatic pressure of water for support, and to offload joints by unweighting joint load by at least 50 % in navel deep water and by at least 75-80% in chest to neck deep water.  Viscosity of the water is needed for resistance of strengthening. Water current perturbations provides challenge to standing balance requiring increased core activation.     PATIENT EDUCATION:  Education details: review of aquatics  Person educated: Patient Education method: ExConsulting civil engineerdemonstration  Education comprehension: verbalized understanding   HOME EXERCISE  PROGRAM: TBA  ASSESSMENT:  CLINICAL IMPRESSION: Pt arrives for first time after bout with pneumonia.  She states she is having some right upper lob lung/chest discomfort from her exertion put forth to walk distance from parking lot to pool >40024f Md aware as pt had appointment yesterday. After consideration Pt agrees to use session  today working on ambulation tolerance with monitoring O2 sats.   Pt will consider wc transport to and from therapy next session to be able to tolerate aquatic session.  We will then progress toleration to activity from there as appropriate.     OBJECTIVE IMPAIRMENTS Abnormal gait, cardiopulmonary status limiting activity, decreased activity tolerance, decreased balance, decreased coordination, decreased endurance, decreased knowledge of use of DME, decreased mobility, difficulty walking, decreased strength, increased fascial restrictions, increased muscle spasms, impaired flexibility, impaired UE functional use, postural dysfunction, obesity, and pain.   ACTIVITY LIMITATIONS sitting, standing, stairs, transfers, bed mobility, reach over head, and locomotion level  PARTICIPATION LIMITATIONS: meal prep, cleaning, laundry, shopping, community activity, occupation, and yard work  PERSONAL FACTORS Fitness, Past/current experiences, Time since onset of injury/illness/exacerbation, and 1 comorbidity: long covid with multiple high risk health factors  are also affecting patient's functional outcome.   REHAB POTENTIAL: Good  CLINICAL DECISION MAKING: Evolving/moderate complexity  EVALUATION COMPLEXITY: Moderate   GOALS: Goals reviewed with patient? Yes  SHORT TERM GOALS: Target date: 11/17/2021  Will be compliant with appropriate progressive HEP to include regular/progressive walking program  Baseline: Goal status: INITIAL  2.  Will be able to ambulate at least 247f in 3MWT without a rest break to show improved functional activity tolerance  Baseline:  Goal  status: INITIAL  3.  Will demonstrate a 25% improvement in trigger points as a whole to reduce pain  Baseline:  Goal status: INITIAL  4.  Will be able to describe at least 3 methods to conserve energy/for fatigue management in order to improve QOL  Baseline:  Goal status: INITIAL  5.  Will be able to perform 5x STS test in 16 seconds or less to show improved functional activity tolerance  Baseline:  Goal status: INITIAL    LONG TERM GOALS: Target date: 12/29/2021   Will improve MMT by at least 1 grade in all weak groups to show improved functional strength  Baseline:  Goal status: INITIAL  2.  Will be able to ambulate at least 3580fin 3MWT with no rest break to show improved functional activity tolerance/improve community access   Baseline:  Goal status: INITIAL  3.  Will score at least 42 on Berg balance test to show improved functional balance/reduced fall risk  Baseline:  Goal status: INITIAL  4.  Will be able to cook a meal and wash dishes in standing without a rest break in order to show improved strength and functional activity tolerance  Baseline:  Goal status: INITIAL  5.  Will be compliant with appropriate gym or pool based exercise program to maintain functional gains from PT  Baseline:  Goal status: INITIAL    PLAN: PT FREQUENCY: 2x/week  PT DURATION: 12 weeks  PLANNED INTERVENTIONS: Therapeutic exercises, Therapeutic activity, Neuromuscular re-education, Balance training, Gait training, Patient/Family education, Self Care, Joint mobilization, Stair training, DME instructions, Aquatic Therapy, Dry Needling, Electrical stimulation, Spinal mobilization, Cryotherapy, Moist heat, Taping, Ultrasound, Ionotophoresis '4mg'$ /ml Dexamethasone, Manual therapy, and Re-evaluation  PLAN FOR NEXT SESSION: first 8 weeks in water, last 4 weeks on land- focus on strength, balance, functional activity tolerance; consider DN to trigger points (if DN certified clinician feels this  is appropriate)  MaStanton KidneyFTharon AquasZiemba MPT 11/10/21   JeKerin PernaPTA 11/10/21 12:38 PM CoCarmichael[address] StreetrHoly CrossNCAlaska275[address]hone: [phone] Fax:  [phone]

## 2021-11-12 ENCOUNTER — Ambulatory Visit (HOSPITAL_BASED_OUTPATIENT_CLINIC_OR_DEPARTMENT_OTHER): Payer: Medicare Other | Admitting: Physical Therapy

## 2021-11-17 ENCOUNTER — Ambulatory Visit (HOSPITAL_BASED_OUTPATIENT_CLINIC_OR_DEPARTMENT_OTHER): Payer: Medicare Other | Admitting: Physical Therapy

## 2021-11-19 ENCOUNTER — Other Ambulatory Visit (HOSPITAL_BASED_OUTPATIENT_CLINIC_OR_DEPARTMENT_OTHER): Payer: Self-pay

## 2021-11-19 ENCOUNTER — Encounter: Payer: Self-pay | Admitting: Oncology

## 2021-11-19 ENCOUNTER — Ambulatory Visit (HOSPITAL_BASED_OUTPATIENT_CLINIC_OR_DEPARTMENT_OTHER): Payer: Medicare Other | Admitting: Physical Therapy

## 2021-11-19 MED ORDER — OZEMPIC (0.25 OR 0.5 MG/DOSE) 2 MG/3ML ~~LOC~~ SOPN
0.5000 mg | PEN_INJECTOR | SUBCUTANEOUS | 5 refills | Status: DC
  Filled 2021-11-19: qty 3, 28d supply, fill #0

## 2021-11-20 ENCOUNTER — Other Ambulatory Visit (HOSPITAL_BASED_OUTPATIENT_CLINIC_OR_DEPARTMENT_OTHER): Payer: Self-pay

## 2021-11-24 ENCOUNTER — Ambulatory Visit (HOSPITAL_BASED_OUTPATIENT_CLINIC_OR_DEPARTMENT_OTHER): Payer: Medicare Other | Admitting: Physical Therapy

## 2021-11-26 ENCOUNTER — Ambulatory Visit (HOSPITAL_BASED_OUTPATIENT_CLINIC_OR_DEPARTMENT_OTHER): Payer: Medicare Other | Admitting: Physical Therapy

## 2021-11-30 ENCOUNTER — Ambulatory Visit (HOSPITAL_BASED_OUTPATIENT_CLINIC_OR_DEPARTMENT_OTHER): Payer: Medicare Other | Admitting: Physical Therapy

## 2021-12-02 ENCOUNTER — Ambulatory Visit (HOSPITAL_BASED_OUTPATIENT_CLINIC_OR_DEPARTMENT_OTHER): Payer: Medicare Other | Admitting: Physical Therapy

## 2021-12-08 ENCOUNTER — Ambulatory Visit (HOSPITAL_BASED_OUTPATIENT_CLINIC_OR_DEPARTMENT_OTHER): Payer: Medicare Other | Admitting: Physical Therapy

## 2021-12-15 ENCOUNTER — Encounter (HOSPITAL_BASED_OUTPATIENT_CLINIC_OR_DEPARTMENT_OTHER): Payer: Medicare Other | Admitting: Physical Therapy

## 2021-12-17 ENCOUNTER — Encounter (HOSPITAL_BASED_OUTPATIENT_CLINIC_OR_DEPARTMENT_OTHER): Payer: Medicare Other | Admitting: Physical Therapy

## 2021-12-22 ENCOUNTER — Encounter (HOSPITAL_BASED_OUTPATIENT_CLINIC_OR_DEPARTMENT_OTHER): Payer: Medicare Other | Admitting: Physical Therapy

## 2021-12-24 ENCOUNTER — Encounter (HOSPITAL_BASED_OUTPATIENT_CLINIC_OR_DEPARTMENT_OTHER): Payer: Medicare Other | Admitting: Physical Therapy

## 2021-12-29 ENCOUNTER — Encounter (HOSPITAL_BASED_OUTPATIENT_CLINIC_OR_DEPARTMENT_OTHER): Payer: Medicare Other | Admitting: Physical Therapy

## 2021-12-31 ENCOUNTER — Encounter (HOSPITAL_BASED_OUTPATIENT_CLINIC_OR_DEPARTMENT_OTHER): Payer: Medicare Other | Admitting: Physical Therapy

## 2022-01-05 ENCOUNTER — Encounter (HOSPITAL_BASED_OUTPATIENT_CLINIC_OR_DEPARTMENT_OTHER): Payer: Medicare Other | Admitting: Physical Therapy

## 2022-01-07 ENCOUNTER — Encounter (HOSPITAL_BASED_OUTPATIENT_CLINIC_OR_DEPARTMENT_OTHER): Payer: Medicare Other | Admitting: Physical Therapy

## 2022-01-12 ENCOUNTER — Telehealth: Payer: Self-pay | Admitting: Physical Medicine and Rehabilitation

## 2022-01-12 DIAGNOSIS — G7281 Critical illness myopathy: Secondary | ICD-10-CM

## 2022-01-12 NOTE — Telephone Encounter (Signed)
Requesting a referral for aquatic PT therapy, per her cardiology he recommended conditioning prior to proceeding with next steps. He found a nodule on her lung.   Please advise.

## 2022-01-15 NOTE — Telephone Encounter (Signed)
Pt.notified

## 2022-01-16 ENCOUNTER — Encounter: Payer: Self-pay | Admitting: Oncology

## 2022-02-03 ENCOUNTER — Encounter: Payer: Self-pay | Admitting: Oncology

## 2022-02-08 ENCOUNTER — Ambulatory Visit: Payer: Medicare Other | Admitting: Physical Medicine and Rehabilitation

## 2022-02-08 ENCOUNTER — Encounter: Payer: Self-pay | Admitting: Physical Medicine and Rehabilitation

## 2022-02-08 ENCOUNTER — Encounter: Payer: Medicare HMO | Attending: Physical Medicine and Rehabilitation | Admitting: Physical Medicine and Rehabilitation

## 2022-02-08 VITALS — BP 125/77 | HR 67 | Ht 67.0 in | Wt 392.0 lb

## 2022-02-08 DIAGNOSIS — U099 Post covid-19 condition, unspecified: Secondary | ICD-10-CM | POA: Diagnosis not present

## 2022-02-08 DIAGNOSIS — M7918 Myalgia, other site: Secondary | ICD-10-CM

## 2022-02-08 MED ORDER — LIDOCAINE HCL 1 % IJ SOLN
6.0000 mL | Freq: Once | INTRAMUSCULAR | Status: AC
  Administered 2022-02-08: 6 mL

## 2022-02-08 NOTE — Progress Notes (Signed)
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 Still has severe difficulty with endurance due to ICU myopathy.    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    They are concerned pt has lung Cancer.  Plaque like right lower lobe pleural based nodularity.   Started vaginal spotting a few days ago and had a hypermetabolic area around vagina-  Also hypermetabolic area around esophagus- doe shave a lot of GERD?  Overdue for colonoscopy- has done last 3 years ago-    Ready for  trigger point injections today.     Plan: Suggested endoscopy due to making sure no mass lesion.  Does CT 1/31, so tumor board will occur 2/1. Being done at [facility]- so verify that doing a Pelvic CT as well- they should be doing chest, abdomen AND pelvis.  3.  Needs to colonoscopy AND endoscopy- colonoscopy- since overdue and endoscopy because she had area on esophagus on your PET scan.   4. Last mammogram 08/10/21- so not due  5. Call Gynecologist- to discuss getting evaluation of vaginal bleeding, esp because had a hypermetabolic area on PET scan around vaginal introitus.   6.Educated on tumor board and whatever decision they make, need to follow it.   7. Patient here for trigger point injections for  myofascial pain  Consent done and on chart.  Cleaned areas with alcohol and injected using a 27 gauge 1.5 inch needle  Injected 3cc Using 1% Lidocaine with no EPI  Upper traps B/L x2 Levator B/L x2 Posterior scalenes Middle scalenes B/L x 2 Splenius Capitus B/L  Pectoralis Major Rhomboids Infraspinatus Teres Major/minor Thoracic paraspinals Lumbar paraspinals Other injections-    Patient's level of pain prior was 5-6-/10 Current level of pain after injections is- same- usually better by time gets in car  There was no bleeding or  complications.  Patient was advised to drink a lot of water on day after injections to flush system Will have increased soreness for 12-48 hours after injections.  Can use Lidocaine patches the day AFTER injections Can use theracane on day of injections in places didn't inject Can use heating pad 4-6 hours AFTER injections  8. Let them no steroids used in trigger point injections.    9. F/U in 6 weeks- trigger point injections.    I spent a total of   31 minutes on total care today- >50% coordination of care- due to 10 minutes on injections and 21 min on discussion of POSSIBLE cancer and vaginal bleeding.

## 2022-02-08 NOTE — Patient Instructions (Signed)
Plan: Suggested endoscopy due to making sure no mass lesion.  Does CT 1/31, so tumor board will occur 2/1. Being done at [facility]- so verify that doing a Pelvic CT as well- they should be doing chest, abdomen AND pelvis.  3.  Needs to colonoscopy AND endoscopy- colonoscopy- since overdue and endoscopy because she had area on esophagus on your PET scan.   4. Last mammogram 08/10/21- so not due  5. Call Gynecologist- to discuss getting evaluation of vaginal bleeding, esp because had a hypermetabolic area on PET scan around vaginal introitus.   6.Educated on tumor board and whatever decision they make, need to follow it.   7. Patient here for trigger point injections for  myofascial pain  Consent done and on chart.  Cleaned areas with alcohol and injected using a 27 gauge 1.5 inch needle  Injected 3cc Using 1% Lidocaine with no EPI  Upper traps B/L x2 Levator B/L x2 Posterior scalenes Middle scalenes B/L x 2 Splenius Capitus B/L  Pectoralis Major Rhomboids Infraspinatus Teres Major/minor Thoracic paraspinals Lumbar paraspinals Other injections-    Patient's level of pain prior was 5-6-/10 Current level of pain after injections is- same- usually better by time gets in car  There was no bleeding or complications.  Patient was advised to drink a lot of water on day after injections to flush system Will have increased soreness for 12-48 hours after injections.  Can use Lidocaine patches the day AFTER injections Can use theracane on day of injections in places didn't inject Can use heating pad 4-6 hours AFTER injections  8. Let them no steroids used in trigger point injections.    9. F/U in 6 weeks- trigger point injections.

## 2022-02-09 ENCOUNTER — Other Ambulatory Visit: Payer: Self-pay

## 2022-02-09 ENCOUNTER — Encounter (HOSPITAL_BASED_OUTPATIENT_CLINIC_OR_DEPARTMENT_OTHER): Payer: Self-pay | Admitting: Physical Therapy

## 2022-02-09 ENCOUNTER — Ambulatory Visit (HOSPITAL_BASED_OUTPATIENT_CLINIC_OR_DEPARTMENT_OTHER): Payer: Medicare HMO | Attending: Physical Medicine and Rehabilitation | Admitting: Physical Therapy

## 2022-02-09 DIAGNOSIS — G7281 Critical illness myopathy: Secondary | ICD-10-CM | POA: Insufficient documentation

## 2022-02-09 DIAGNOSIS — R2681 Unsteadiness on feet: Secondary | ICD-10-CM | POA: Diagnosis present

## 2022-02-09 DIAGNOSIS — U099 Post covid-19 condition, unspecified: Secondary | ICD-10-CM | POA: Insufficient documentation

## 2022-02-09 DIAGNOSIS — Z7409 Other reduced mobility: Secondary | ICD-10-CM | POA: Insufficient documentation

## 2022-02-09 NOTE — Therapy (Signed)
OUTPATIENT PHYSICAL THERAPY LOWER EXTREMITY EVALUATION   Patient Name: Kaitlin [name] MRN: [id number] DOB:1965-06-18, 57 y.o., female Today's Date: 02/09/2022  END OF SESSION:  PT End of Session - 02/09/22 1306     Visit Number 1    Number of Visits 12    Date for PT Re-Evaluation 04/06/22    Authorization Type Medicare    Authorization Time Period 02/09/22 - 04/06/22    Progress Note Due on Visit 10    PT Start Time 1204    PT Stop Time 8416    PT Time Calculation (min) 31 min    Activity Tolerance Patient tolerated treatment well    Behavior During Therapy [facility] for tasks assessed/performed             Past Medical History:  Diagnosis Date   Anemia    Bronchitis    Cataracts, bilateral 01/12/2018   CHF (congestive heart failure) (HCC)    Diabetes mellitus    High cholesterol    Hypertension    takes medicine to protect kidneys, does not have HTN   Leukocytosis    Morbid obesity ([place]) 09/11/2011   BMI 57   Neuropathy 01/12/2018   NICM (nonischemic cardiomyopathy) ([place]) 05/01/2013   Overview:  Last Assessment & Plan:  euvolemic on physical exam.    Obesity    Sickle cell trait ([place])    Thymoma    Past Surgical History:  Procedure Laterality Date   COLONOSCOPY N/A 06/21/2017   Procedure: COLONOSCOPY;  Surgeon: Ronnette Juniper, MD;  Location: [facility];  Service: Gastroenterology;  Laterality: N/A;   LEFT HEART CATHETERIZATION WITH CORONARY ANGIOGRAM N/A 05/25/2013   Procedure: LEFT HEART CATHETERIZATION WITH CORONARY ANGIOGRAM;  Surgeon: Troy Sine, MD;  Location: [facility] CATH LAB;  Service: Cardiovascular;  Laterality: N/A;   LYMPH NODE BIOPSY Right 11/30/2012   Procedure: RIGHT TONSIL BIOPSY WITH FRESH FROZEN ANALYSIS;  Surgeon: Jodi Marble, MD;  Location: [place];  Service: ENT;  Laterality: Right;   MEDIASTERNOTOMY N/A 01/12/2013   Procedure: MEDIAN STERNOTOMY;  Surgeon: Gaye Pollack, MD;  Location: [place];  Service: Thoracic;  Laterality: N/A;   MEDIASTINOTOMY  CHAMBERLAIN MCNEIL    Right 11/06/2012   Procedure: MEDIASTINOTOMY CHAMBERLAIN MCNEIL PROCEDURE;  Surgeon: Gaye Pollack, MD;  Location: [place];  Service: Thoracic;  Laterality: Right;   POLYPECTOMY  06/21/2017   Procedure: POLYPECTOMY;  Surgeon: Ronnette Juniper, MD;  Location: [facility];  Service: Gastroenterology;;   RESECTION OF A THYMOMA N/A 01/12/2013   Procedure: RESECTION OF A THYMOMA;  Surgeon: Gaye Pollack, MD;  Location: [place];  Service: Thoracic;  Laterality: N/A;   TONSILLECTOMY     TUBAL LIGATION     Patient Active Problem List   Diagnosis Date Noted   Long COVID 01/02/2020   Mixed stress and urge urinary incontinence 07/04/2019   Nerve pain 05/16/2019   Myofascial pain 05/16/2019   Subluxation of acromioclavicular joint, right, sequela 03/23/2019   Sickle cell trait ([place])    Labile blood glucose    Diabetic peripheral neuropathy (HCC)    Hypokalemia    Pain in joint of left shoulder    Debility    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   COVID-19    History of anemia    Super obese    Poorly controlled type 2 diabetes mellitus with peripheral neuropathy ([place])    Acute on chronic diastolic (congestive) heart failure ([place])    Hypernatremia    Shock circulatory ([place])  Septic shock (HCC)    Hypotension 01/13/2019   Acute respiratory failure with hypoxemia (HCC) 01/13/2019   Acute respiratory distress syndrome (ARDS) due to COVID-19 virus ([place]) 01/12/2019   Grade I diastolic dysfunction 33/35/4562   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   Dyspnea on exertion 05/19/2016   Other fatigue 05/19/2016   Absolute anemia 04/15/2016   Left leg weakness 10/18/2014   Left leg paresthesias 10/18/2014   Acute left-sided weakness 10/18/2014   Encounter for central line placement 03/08/2014   Taking drug for chronic disease 03/08/2014   H/O insertion of insulin pump 02/18/2014   Long term current use of insulin ([place]) 02/18/2014   Insulin pump in place 02/18/2014   Bernhardt's  paresthesia 06/18/2013   Abnormal ballistocardiogram 05/18/2013   Abnormal nuclear stress test 05/18/2013   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   NICM (nonischemic cardiomyopathy) ([place]) 05/01/2013   Insulin dependent type 2 diabetes mellitus ([place]) 04/10/2013   Sex counseling 04/10/2013   Edema leg 04/10/2013   Upper respiratory infection, viral 04/10/2013   Pedal edema 03/29/2013   Pneumonia 12/12/2012   Essential hypertension 12/12/2012   HLD (hyperlipidemia) 12/12/2012   Tonsillar mass 12/01/2012   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   Leukocytosis    Anemia    High cholesterol    Hypertension    Blurred vision 09/11/2011   Foot pain 09/11/2011   Breast pain 09/11/2011   Morbid obesity ([place]) 09/11/2011   Fungal infection of nail 09/11/2011   Avitaminosis D 09/11/2011    PCP: Curt Jews,  PA-c REFERRING PROVIDER:   Courtney Heys, MD    REFERRING DIAG: G72.81 (ICD-10-CM) - Intensive care (ICU) myopathy   THERAPY DIAG:  Critical illness myopathy  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  Plaque like right lower lobe pleural based nodularity.   Pt had ICU myopathy and is still very weak generalized- also has BMI >50- needs aquatic therapy- per her cardiologist for conditioning  PAIN:  Are you having pain? Yes: NPRS scale: current 6/10; worst 8-9/10; 0/10 Pain location: knees R>L ; right chest area with exersion (breathing) Pain description: ache, tingling Aggravating factors: stair climbing; walking 3 minutes Relieving factors: lying sleeping, tylenol  PRECAUTIONS: Knee and Fall  WEIGHT BEARING RESTRICTIONS: No  FALLS:  Has  patient fallen in last 6 months? No  LIVING ENVIRONMENT: Lives with: lives with their family Lives in: House/apartment Stairs: Yes: Internal: 16 steps; on left going up Has following equipment at home: None  OCCUPATION: disability  PLOF: Independent  PATIENT GOALS: to build up my strength, being able to walk without needing to sit every 2-3 minutes, stand to cook or clean.   NEXT MD VISIT: 02/10/22  OBJECTIVE:   DIAGNOSTIC FINDINGS: none relevant in chart  PATIENT SURVEYS:  FOTO to be complete next visit.  COGNITION: Overall cognitive status: Within functional limits for tasks assessed     SENSATION: Periphreal neuropathies mid calf to feet and hands   POSTURE: rounded shoulders, forward head, and flexed trunk    LOWER EXTREMITY ROM:  WFL  LOWER EXTREMITY MMT:  MMT Right eval Left eval  Hip flexion Seated 3+ 3  Hip extension 3 standing 3  Hip abduction 4 4  Hip adduction 4 4  Hip internal rotation    Hip external rotation    Knee flexion 4- 3+  Knee extension 4- 4-  Ankle dorsiflexion    Ankle plantarflexion    Ankle inversion    Ankle eversion     (Blank rows = not tested)  FUNCTIONAL TESTS:  5 times sit to stand: 19.43 from wc: pulse 114 O2 sats 94%    GAIT: Distance walked: 40 ft Assistive device utilized: None Level of assistance: SBA Comments: cadence slowed, step length short; increased lateral displacement   TODAY'S TREATMENT:                                                                                                                              Evaluation Objective testing   PATIENT EDUCATION:  Education details: Discussed eval findings, rehab rationale and POC and patient is in agreement  Person educated: Patient Education method: Explanation Education comprehension: verbalized understanding  HOME EXERCISE PROGRAM: [id number] from last episode.  ASSESSMENT:  CLINICAL IMPRESSION: Plan : complete Foto Patient is a 57 y.o.  F who was seen today for physical therapy evaluation and treatment for ICU myopathy. She also continues with Post covid issues.  She had begun therapy a few months ago after a bout with pneumonia which at that time had not completely cleared so she stopped coming. Today she presents with muscle weakness, decreased toleration to amb/activity, discomfort in knees from OA as well as SOB with minimal  exertion.  There is a suspicion that she has a lung cancer  which will be determined tomorrow with scans at [facility]. (Other areas of concern is vagina and esophagus as they did show markers with recent scan).  I am not certain how well pt will tolerate aquatic intervention.  She is a good candidate for skilled physical therapy if tolerated, will benefit from the properties of water to improve strength, balance toleration to activity improving functional mobility and safety.  OBJECTIVE IMPAIRMENTS: Abnormal gait, decreased activity tolerance, decreased balance, decreased endurance, decreased knowledge of use of DME, decreased mobility, difficulty walking, decreased strength, impaired sensation, postural dysfunction, obesity, and pain.   ACTIVITY LIMITATIONS: carrying, lifting, bending, sitting, standing, squatting, stairs, transfers, and reach over head  PARTICIPATION LIMITATIONS: meal prep, cleaning, laundry, driving, shopping, and community activity  PERSONAL FACTORS: Fitness, Time since onset of injury/illness/exacerbation, and 3+ comorbidities: post covid, ICU myopathy, possible ca  are also affecting patient's functional outcome.   REHAB POTENTIAL: Good  CLINICAL DECISION MAKING: Unstable/unpredictable  EVALUATION COMPLEXITY: Moderate   GOALS: Goals reviewed with patient? Yes  SHORT TERM GOALS: Target date: 03/09/22 Pt to tolerate session of aquatic therapy with rest periods consistently for 1 month to demonstrate pt toleration to intervention. Baseline:TBA Goal status: INITIAL  2.  Pt to  complete 2 minute walk test without decrease in O2 sats <90%. Baseline:  Goal status: INITIAL  3.  Pt will report improved toleration to standing  (  cooking, doing dishes) up to 5 minutes without needing rest period to demonstrate improving activity toleration. Baseline: <3 mins Goal status: INITIAL  4.  Pt to improve in strength by at least 1/2 grade LE to improve functional mobility and ADL's Baseline: see chart above Goal status: INITIAL  5.  Pt will report a decrease in knee pain no higher than 5/10 Baseline: see chart Goal status: INITIAL    LONG TERM GOALS: Target date: 04/06/22  Pt will meet stated Foto goal Baseline: see test Goal status: INITIAL  2.  Pt will walk 600 ft in 2  minutes to demonstrate improved toleration (norm) Baseline: tba Goal status: INITIAL  3.  Pt will improve on 5 X STS test to < 13s to demonstrate improved mobility and strength Baseline: 19.43 Goal status: INITIAL  4.  Pt will tolerate walking one way to pool and session without reports of excessive fatigue Baseline: Pt using wc to and from Goal status: INITIAL  5.  Pt will be indep with final HEP to continue with management of condition. Baseline: initial HEP Goal status: INITIAL     PLAN:  PT FREQUENCY: 1-2x/week  PT DURATION: 8 weeks  PLANNED INTERVENTIONS: Therapeutic exercises, Therapeutic activity, Neuromuscular re-education, Balance training, Gait training, Patient/Family education, Self Care, Joint mobilization, Stair training, DME instructions, Aquatic Therapy, Dry Needling, Electrical stimulation, Cryotherapy, Moist heat, Taping, Ultrasound, Ionotophoresis '4mg'$ /ml Dexamethasone, Manual therapy, and Re-evaluation  PLAN FOR NEXT SESSION: Initiate aquatic therapy working walking toleration; endurance/aerobic capacity, strengthening LE and core.  Allow for multiple rest periods as needed.  Monitor O2 sats and pulse throughout.   Denton Meek, PTMPT 02/09/2022, 1:57 PM

## 2022-02-10 ENCOUNTER — Encounter (INDEPENDENT_AMBULATORY_CARE_PROVIDER_SITE_OTHER): Payer: Medicare Other | Admitting: Family Medicine

## 2022-03-02 ENCOUNTER — Ambulatory Visit (HOSPITAL_BASED_OUTPATIENT_CLINIC_OR_DEPARTMENT_OTHER): Payer: Medicare HMO | Attending: Physical Medicine and Rehabilitation | Admitting: Physical Therapy

## 2022-03-02 ENCOUNTER — Encounter (HOSPITAL_BASED_OUTPATIENT_CLINIC_OR_DEPARTMENT_OTHER): Payer: Self-pay | Admitting: Physical Therapy

## 2022-03-02 DIAGNOSIS — Z7409 Other reduced mobility: Secondary | ICD-10-CM | POA: Insufficient documentation

## 2022-03-02 DIAGNOSIS — R2681 Unsteadiness on feet: Secondary | ICD-10-CM | POA: Insufficient documentation

## 2022-03-02 DIAGNOSIS — G7281 Critical illness myopathy: Secondary | ICD-10-CM | POA: Insufficient documentation

## 2022-03-02 DIAGNOSIS — U099 Post covid-19 condition, unspecified: Secondary | ICD-10-CM | POA: Diagnosis present

## 2022-03-02 NOTE — Therapy (Signed)
OUTPATIENT PHYSICAL THERAPY LOWER EXTREMITY EVALUATION   Patient Name: Kaitlin [name] MRN: [id number] DOB:[birth year], 57 y.o., female Today's Date: 03/02/2022  END OF SESSION:  PT End of Session - 03/02/22 0853     Visit Number 2    Number of Visits 12    Date for PT Re-Evaluation 04/06/22    Authorization Type Medicare    Authorization Time Period 02/09/22 - 04/06/22    Progress Note Due on Visit 10    PT Start Time 0900    PT Stop Time 0945    PT Time Calculation (min) 45 min    Activity Tolerance Patient tolerated treatment well    Behavior During Therapy [facility] for tasks assessed/performed             Past Medical History:  Diagnosis Date   Anemia    Bronchitis    Cataracts, bilateral 01/12/2018   CHF (congestive heart failure) ([place])    Diabetes mellitus    High cholesterol    Hypertension    takes medicine to protect kidneys, does not have HTN   Leukocytosis    Morbid obesity ([place]) 09/11/2011   BMI 57   Neuropathy 01/12/2018   NICM (nonischemic cardiomyopathy) ([place]) 05/01/2013   Overview:  Last Assessment & Plan:  euvolemic on physical exam.    Obesity    Sickle cell trait ([place])    Thymoma    Past Surgical History:  Procedure Laterality Date   COLONOSCOPY N/A 06/21/2017   Procedure: COLONOSCOPY;  Surgeon: Ronnette Juniper, MD;  Location: [facility];  Service: Gastroenterology;  Laterality: N/A;   LEFT HEART CATHETERIZATION WITH CORONARY ANGIOGRAM N/A 05/25/2013   Procedure: LEFT HEART CATHETERIZATION WITH CORONARY ANGIOGRAM;  Surgeon: Troy Sine, MD;  Location: [facility] CATH LAB;  Service: Cardiovascular;  Laterality: N/A;   LYMPH NODE BIOPSY Right 11/30/2012   Procedure: RIGHT TONSIL BIOPSY WITH FRESH FROZEN ANALYSIS;  Surgeon: Jodi Marble, MD;  Location: [place];  Service: ENT;  Laterality: Right;   MEDIASTERNOTOMY N/A 01/12/2013   Procedure: MEDIAN STERNOTOMY;  Surgeon: Gaye Pollack, MD;  Location: [place];  Service: Thoracic;  Laterality: N/A;   MEDIASTINOTOMY  CHAMBERLAIN MCNEIL    Right 11/06/2012   Procedure: MEDIASTINOTOMY CHAMBERLAIN MCNEIL PROCEDURE;  Surgeon: Gaye Pollack, MD;  Location: [place];  Service: Thoracic;  Laterality: Right;   POLYPECTOMY  06/21/2017   Procedure: POLYPECTOMY;  Surgeon: Ronnette Juniper, MD;  Location: [facility];  Service: Gastroenterology;;   RESECTION OF A THYMOMA N/A 01/12/2013   Procedure: RESECTION OF A THYMOMA;  Surgeon: Gaye Pollack, MD;  Location: [place];  Service: Thoracic;  Laterality: N/A;   TONSILLECTOMY     TUBAL LIGATION     Patient Active Problem List   Diagnosis Date Noted   Long COVID 01/02/2020   Mixed stress and urge urinary incontinence 07/04/2019   Nerve pain 05/16/2019   Myofascial pain 05/16/2019   Subluxation of acromioclavicular joint, right, sequela 03/23/2019   Sickle cell trait ([place])    Labile blood glucose    Diabetic peripheral neuropathy (HCC)    Hypokalemia    Pain in joint of left shoulder    Debility    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   COVID-19    History of anemia    Super obese    Poorly controlled type 2 diabetes mellitus with peripheral neuropathy ([place])    Acute on chronic diastolic (congestive) heart failure ([place])    Hypernatremia    Shock circulatory ([place])  Septic shock (HCC)    Hypotension 01/13/2019   Acute respiratory failure with hypoxemia (HCC) 01/13/2019   Acute respiratory distress syndrome (ARDS) due to COVID-19 virus ([place]) 01/12/2019   Grade I diastolic dysfunction [id number]   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   Dyspnea on exertion 05/19/2016   Other fatigue 05/19/2016   Absolute anemia 04/15/2016   Left leg weakness 10/18/2014   Left leg paresthesias 10/18/2014   Acute left-sided weakness 10/18/2014   Encounter for central line placement 03/08/2014   Taking drug for chronic disease 03/08/2014   H/O insertion of insulin pump 02/18/2014   Long term current use of insulin ([place]) 02/18/2014   Insulin pump in place 02/18/2014   Bernhardt's  paresthesia 06/18/2013   Abnormal ballistocardiogram 05/18/2013   Abnormal nuclear stress test 05/18/2013   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   NICM (nonischemic cardiomyopathy) ([place]) 05/01/2013   Insulin dependent type 2 diabetes mellitus ([place]) 04/10/2013   Sex counseling 04/10/2013   Edema leg 04/10/2013   Upper respiratory infection, viral 04/10/2013   Pedal edema 03/29/2013   Pneumonia 12/12/2012   Essential hypertension 12/12/2012   HLD (hyperlipidemia) 12/12/2012   Tonsillar mass 12/01/2012   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   Leukocytosis    Anemia    High cholesterol    Hypertension    Blurred vision 09/11/2011   Foot pain 09/11/2011   Breast pain 09/11/2011   Morbid obesity ([place]) 09/11/2011   Fungal infection of nail 09/11/2011   Avitaminosis D 09/11/2011    PCP: Curt Jews,  PA-c REFERRING PROVIDER:   Courtney Heys, MD    REFERRING DIAG: G72.81 (ICD-10-CM) - Intensive care (ICU) myopathy   THERAPY DIAG:  Critical illness myopathy  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  PERTINENT HISTORY: Dr Dagoberto Ligas:  They are concerned pt has lung Cancer.  Plaque like right lower lobe pleural based nodularity.   Pt had ICU myopathy and is still very weak generalized- also has BMI >50- needs aquatic therapy- per her cardiologist for conditioning  PAIN:  Are you having pain? Yes: NPRS scale: current 6/10; worst 8-9/10; 0/10 Pain location: knees R>L ; right chest area with exersion (breathing) Pain description: ache, tingling Aggravating factors: stair climbing; walking 3 minutes Relieving factors: lying sleeping, tylenol  PRECAUTIONS: Knee and Fall  WEIGHT BEARING RESTRICTIONS: No  FALLS:  Has patient fallen in  last 6 months? No  LIVING ENVIRONMENT: Lives with: lives with their family Lives in: House/apartment Stairs: Yes: Internal: 16 steps; on left going up Has following equipment at home: None  OCCUPATION: disability  PLOF: Independent  PATIENT GOALS: to build up my strength, being able to walk without needing to sit every 2-3 minutes, stand to cook or clean.   NEXT MD VISIT: 02/10/22  OBJECTIVE:   DIAGNOSTIC FINDINGS: none relevant in chart  PATIENT SURVEYS:  FOTO to be complete next visit. 03/02/22: Primary 38%; Risk adjusted 49%; goal 51% visit 12  COGNITION: Overall cognitive status: Within functional limits for tasks assessed     SENSATION: Periphreal neuropathies mid calf to feet and hands   POSTURE: rounded shoulders, forward head, and flexed trunk    LOWER EXTREMITY ROM:  WFL  LOWER EXTREMITY MMT:  MMT Right eval Left eval  Hip flexion Seated 3+ 3  Hip extension 3 standing 3  Hip abduction 4 4  Hip adduction 4 4  Hip internal rotation    Hip external rotation    Knee flexion 4- 3+  Knee extension 4- 4-  Ankle dorsiflexion    Ankle plantarflexion    Ankle inversion    Ankle eversion     (Blank rows = not tested)  FUNCTIONAL TESTS:  5 times sit to stand: 19.43 from wc: pulse 114 O2 sats 94%    GAIT: Distance walked: 40 ft Assistive device utilized: None Level of assistance: SBA Comments: cadence slowed, step length short; increased lateral displacement   TODAY'S TREATMENT:                                                                                                                              Pt seen for aquatic therapy today.  Treatment took place in water 3.25-4.5 ft in depth at the [facility]. Temp of water was 91.  Pt entered/exited the pool via stairs using alternating pattern with bilat hand rail.  *Walking 3.6 ft forward x 6 widths ue support white barbell - seated rest period O2 sats and pulse monitored  *walking  forward and backward x 8 widths  - seated rest period O2 sats and pulse monitored  *side stepping x 8 widths  - seated rest period O2 sats and pulse monitored  *Standing ue support on wall:df; pf and squats 2x10; hip add/abd; hip extension x 10  - Standing then seated rest period O2 sats and pulse monitored  Pt requires the buoyancy and hydrostatic pressure of water for support, and to offload joints by unweighting joint load by at least 50 % in navel deep water and by at least 75-80% in chest to neck deep water.  Viscosity of the water is needed for resistance of strengthening. Water current perturbations provides challenge to standing balance requiring increased core activation.     PATIENT EDUCATION:  Education details: Discussed eval findings, rehab rationale and POC and patient is in agreement  Person educated: Patient Education method: Explanation Education comprehension: verbalized understanding  HOME EXERCISE PROGRAM: [id number] from last episode.  ASSESSMENT:  CLINICAL IMPRESSION: Pt with fairly long delay (3 weeks) in returning to therapy due to MD appointments and other scheduling delays. She reports 3 sitting rest periods while amb on her way back to pool (total of 500 ft). O2 sats and pulse monitored thorughout session with min O2 sats 91%; and max HR 114.  She is given multiple recovery periods .  Focus on aerobic capacity and toleration to activiy walking and with gentle strengthening. She tolerates well.  Negotiates steps in and out using hand rail. Does request wc transport to car out of session.  Foto completed  Plan : complete Foto Patient is a 57 y.o. F who was seen today for physical therapy evaluation and treatment for ICU myopathy. She also continues with Post covid issues.  She had  begun therapy a few months ago after a bout with pneumonia which at that time had not completely cleared so she stopped coming. Today she presents with muscle weakness, decreased toleration  to amb/activity, discomfort in knees from OA as well as SOB with minimal  exertion.  There is a suspicion that she has a lung cancer  which will be determined tomorrow with scans at [facility]. (Other areas of concern is vagina and esophagus as they did show markers with recent scan).  I am not certain how well pt will tolerate aquatic intervention.  She is a good candidate for skilled physical therapy if tolerated, will benefit from the properties of water to improve strength, balance toleration to activity improving functional mobility and safety.  OBJECTIVE IMPAIRMENTS: Abnormal gait, decreased activity tolerance, decreased balance, decreased endurance, decreased knowledge of use of DME, decreased mobility, difficulty walking, decreased strength, impaired sensation, postural dysfunction, obesity, and pain.   ACTIVITY LIMITATIONS: carrying, lifting, bending, sitting, standing, squatting, stairs, transfers, and reach over head  PARTICIPATION LIMITATIONS: meal prep, cleaning, laundry, driving, shopping, and community activity  PERSONAL FACTORS: Fitness, Time since onset of injury/illness/exacerbation, and 3+ comorbidities: post covid, ICU myopathy, possible ca  are also affecting patient's functional outcome.   REHAB POTENTIAL: Good  CLINICAL DECISION MAKING: Unstable/unpredictable  EVALUATION COMPLEXITY: Moderate   GOALS: Goals reviewed with patient? Yes  SHORT TERM GOALS: Target date: 03/09/22 Pt to tolerate session of aquatic therapy with rest periods consistently for 1 month to demonstrate pt toleration to intervention. Baseline:TBA Goal status: INITIAL  2.  Pt to complete 2 minute walk test without decrease in O2 sats <90%. Baseline:  Goal status: INITIAL  3.  Pt will report improved toleration to standing  (cooking, doing dishes) up to 5 minutes without needing rest period to demonstrate improving activity toleration. Baseline: <3 mins Goal status: INITIAL  4.  Pt to improve  in strength by at least 1/2 grade LE to improve functional mobility and ADL's Baseline: see chart above Goal status: INITIAL  5.  Pt will report a decrease in knee pain no higher than 5/10 Baseline: see chart Goal status: INITIAL    LONG TERM GOALS: Target date: 04/06/22  Pt will meet stated Foto goal Baseline: see test Goal status: INITIAL  2.  Pt will walk 600 ft in 2  minutes to demonstrate improved toleration (norm) Baseline: tba Goal status: INITIAL  3.  Pt will improve on 5 X STS test to < 13s to demonstrate improved mobility and strength Baseline: 19.43 Goal status: INITIAL  4.  Pt will tolerate walking one way to pool and session without reports of excessive fatigue Baseline: Pt using wc to and from Goal status: INITIAL  5.  Pt will be indep with final HEP to continue with management of condition. Baseline: initial HEP Goal status: INITIAL     PLAN:  PT FREQUENCY: 1-2x/week  PT DURATION: 8 weeks  PLANNED INTERVENTIONS: Therapeutic exercises, Therapeutic activity, Neuromuscular re-education, Balance training, Gait training, Patient/Family education, Self Care, Joint mobilization, Stair training, DME instructions, Aquatic Therapy, Dry Needling, Electrical stimulation, Cryotherapy, Moist heat, Taping, Ultrasound, Ionotophoresis 20m/ml Dexamethasone, Manual therapy, and Re-evaluation  PLAN FOR NEXT SESSION: Initiate aquatic therapy working walking toleration; endurance/aerobic capacity, strengthening LE and core.  Allow for multiple rest periods as needed.  Monitor O2 sats and pulse throughout.   FDenton Meek PTMPT 03/02/2022, 11:11 AM

## 2022-03-09 ENCOUNTER — Ambulatory Visit (HOSPITAL_BASED_OUTPATIENT_CLINIC_OR_DEPARTMENT_OTHER): Payer: Medicare HMO | Admitting: Physical Therapy

## 2022-03-09 ENCOUNTER — Encounter (HOSPITAL_BASED_OUTPATIENT_CLINIC_OR_DEPARTMENT_OTHER): Payer: Self-pay | Admitting: Physical Therapy

## 2022-03-09 ENCOUNTER — Encounter: Payer: Self-pay | Admitting: Oncology

## 2022-03-09 DIAGNOSIS — R2681 Unsteadiness on feet: Secondary | ICD-10-CM

## 2022-03-09 DIAGNOSIS — G7281 Critical illness myopathy: Secondary | ICD-10-CM | POA: Diagnosis not present

## 2022-03-09 DIAGNOSIS — U099 Post covid-19 condition, unspecified: Secondary | ICD-10-CM

## 2022-03-09 NOTE — Therapy (Signed)
OUTPATIENT PHYSICAL THERAPY LOWER EXTREMITY EVALUATION   Patient Name: Kaitlin [name] MRN: [id number] DOB:February 05, 1965, 57 y.o., female Today's Date: 03/09/2022  END OF SESSION:  PT End of Session - 03/09/22 1126     Visit Number 3    Number of Visits 12    Date for PT Re-Evaluation 04/06/22    Authorization Type Medicare    Authorization Time Period 02/09/22 - 04/06/22    Progress Note Due on Visit 10    PT Start Time 1119    PT Stop Time 1200    PT Time Calculation (min) 41 min    Activity Tolerance Patient tolerated treatment well    Behavior During Therapy [facility] for tasks assessed/performed             Past Medical History:  Diagnosis Date   Anemia    Bronchitis    Cataracts, bilateral 01/12/2018   CHF (congestive heart failure) ([place])    Diabetes mellitus    High cholesterol    Hypertension    takes medicine to protect kidneys, does not have HTN   Leukocytosis    Morbid obesity ([place]) 09/11/2011   BMI 57   Neuropathy 01/12/2018   NICM (nonischemic cardiomyopathy) ([place]) 05/01/2013   Overview:  Last Assessment & Plan:  euvolemic on physical exam.    Obesity    Sickle cell trait ([place])    Thymoma    Past Surgical History:  Procedure Laterality Date   COLONOSCOPY N/A 06/21/2017   Procedure: COLONOSCOPY;  Surgeon: Ronnette Juniper, MD;  Location: [facility];  Service: Gastroenterology;  Laterality: N/A;   LEFT HEART CATHETERIZATION WITH CORONARY ANGIOGRAM N/A 05/25/2013   Procedure: LEFT HEART CATHETERIZATION WITH CORONARY ANGIOGRAM;  Surgeon: Troy Sine, MD;  Location: [facility] CATH LAB;  Service: Cardiovascular;  Laterality: N/A;   LYMPH NODE BIOPSY Right 11/30/2012   Procedure: RIGHT TONSIL BIOPSY WITH FRESH FROZEN ANALYSIS;  Surgeon: Jodi Marble, MD;  Location: [place];  Service: ENT;  Laterality: Right;   MEDIASTERNOTOMY N/A 01/12/2013   Procedure: MEDIAN STERNOTOMY;  Surgeon: Gaye Pollack, MD;  Location: [place];  Service: Thoracic;  Laterality: N/A;   MEDIASTINOTOMY  CHAMBERLAIN MCNEIL    Right 11/06/2012   Procedure: MEDIASTINOTOMY CHAMBERLAIN MCNEIL PROCEDURE;  Surgeon: Gaye Pollack, MD;  Location: [place];  Service: Thoracic;  Laterality: Right;   POLYPECTOMY  06/21/2017   Procedure: POLYPECTOMY;  Surgeon: Ronnette Juniper, MD;  Location: [facility];  Service: Gastroenterology;;   RESECTION OF A THYMOMA N/A 01/12/2013   Procedure: RESECTION OF A THYMOMA;  Surgeon: Gaye Pollack, MD;  Location: [place];  Service: Thoracic;  Laterality: N/A;   TONSILLECTOMY     TUBAL LIGATION     Patient Active Problem List   Diagnosis Date Noted   Long COVID 01/02/2020   Mixed stress and urge urinary incontinence 07/04/2019   Nerve pain 05/16/2019   Myofascial pain 05/16/2019   Subluxation of acromioclavicular joint, right, sequela 03/23/2019   Sickle cell trait ([place])    Labile blood glucose    Diabetic peripheral neuropathy (HCC)    Hypokalemia    Pain in joint of left shoulder    Debility    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   COVID-19    History of anemia    Super obese    Poorly controlled type 2 diabetes mellitus with peripheral neuropathy ([place])    Acute on chronic diastolic (congestive) heart failure ([place])    Hypernatremia    Shock circulatory ([place])  Septic shock (HCC)    Hypotension 01/13/2019   Acute respiratory failure with hypoxemia (HCC) 01/13/2019   Acute respiratory distress syndrome (ARDS) due to COVID-19 virus ([place]) 01/12/2019   Grade I diastolic dysfunction [id number]   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   Dyspnea on exertion 05/19/2016   Other fatigue 05/19/2016   Absolute anemia 04/15/2016   Left leg weakness 10/18/2014   Left leg paresthesias 10/18/2014   Acute left-sided weakness 10/18/2014   Encounter for central line placement 03/08/2014   Taking drug for chronic disease 03/08/2014   H/O insertion of insulin pump 02/18/2014   Long term current use of insulin ([place]) 02/18/2014   Insulin pump in place 02/18/2014   Bernhardt's  paresthesia 06/18/2013   Abnormal ballistocardiogram 05/18/2013   Abnormal nuclear stress test 05/18/2013   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   NICM (nonischemic cardiomyopathy) ([place]) 05/01/2013   Insulin dependent type 2 diabetes mellitus ([place]) 04/10/2013   Sex counseling 04/10/2013   Edema leg 04/10/2013   Upper respiratory infection, viral 04/10/2013   Pedal edema 03/29/2013   Pneumonia 12/12/2012   Essential hypertension 12/12/2012   HLD (hyperlipidemia) 12/12/2012   Tonsillar mass 12/01/2012   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   Leukocytosis    Anemia    High cholesterol    Hypertension    Blurred vision 09/11/2011   Foot pain 09/11/2011   Breast pain 09/11/2011   Morbid obesity ([place]) 09/11/2011   Fungal infection of nail 09/11/2011   Avitaminosis D 09/11/2011    PCP: Curt Jews,  PA-c REFERRING PROVIDER:   Courtney Heys, MD    REFERRING DIAG: G72.81 (ICD-10-CM) - Intensive care (ICU) myopathy   THERAPY DIAG:  Critical illness myopathy  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 Pain is down today.  Just a little tired after last session  PERTINENT HISTORY: Dr Dagoberto Ligas:  They are concerned pt has lung Cancer.  Plaque like right lower lobe pleural based nodularity.   Pt had ICU myopathy and is still very weak generalized- also has BMI >50- needs aquatic therapy- per her cardiologist for conditioning  PAIN:  Are you having pain? Yes: NPRS scale: current 4-5/10; worst 8-9/10; 0/10 Pain location: knees R>L ; right chest area with exersion (breathing) Pain description: ache, tingling Aggravating factors: stair climbing; walking 3 minutes Relieving factors: lying sleeping, tylenol  PRECAUTIONS: Knee and Fall  WEIGHT BEARING  RESTRICTIONS: No  FALLS:  Has patient fallen in last 6 months? No  LIVING ENVIRONMENT: Lives with: lives with their family Lives in: House/apartment Stairs: Yes: Internal: 16 steps; on left going up Has following equipment at home: None  OCCUPATION: disability  PLOF: Independent  PATIENT GOALS: to build up my strength, being able to walk without needing to sit every 2-3 minutes, stand to cook or clean.   NEXT MD VISIT: 02/10/22  OBJECTIVE:   DIAGNOSTIC FINDINGS: none relevant in chart  PATIENT SURVEYS:  FOTO to be complete next visit. 03/02/22: Primary 38%; Risk adjusted 49%; goal 51% visit 12  COGNITION: Overall cognitive status: Within functional limits for tasks assessed     SENSATION: Periphreal neuropathies mid calf to feet and hands   POSTURE: rounded shoulders, forward head, and flexed trunk    LOWER EXTREMITY ROM:  WFL  LOWER EXTREMITY MMT:  MMT Right  eval Left eval  Hip flexion Seated 3+ 3  Hip extension 3 standing 3  Hip abduction 4 4  Hip adduction 4 4  Hip internal rotation    Hip external rotation    Knee flexion 4- 3+  Knee extension 4- 4-  Ankle dorsiflexion    Ankle plantarflexion    Ankle inversion    Ankle eversion     (Blank rows = not tested)  FUNCTIONAL TESTS:  5 times sit to stand: 19.43 from wc: pulse 114 O2 sats 94%    GAIT: Distance walked: 40 ft Assistive device utilized: None Level of assistance: SBA Comments: cadence slowed, step length short; increased lateral displacement   TODAY'S TREATMENT:                                                                                                                              Pt seen for aquatic therapy today.  Treatment took place in water 3.25-4.5 ft in depth at the [place]. Temp of water was 91.  Pt entered/exited the pool via stairs using alternating pattern with bilat hand rail.  *Walking 3.6 ft forward and back x 10 widths ue support white barbell -  seated rest period *Standing ue support on wall:df; pf and squats x10; hip add/abd; hip extension x 10 *Unsupported marching x 10 with arm swing  - seated rest period  *Side stepping x 8 widths then x 4 widths with 1 foam hand buoys submerged at sides for core engagement  - seated rest period  *STS from bench onto blue step 2x 10 ("that was a little stressful"). Seated short rest period between  *seated: 1/2 noodle pull down to knees x 10  *standing 1/2 noodle pull down   Pt requires the buoyancy and hydrostatic pressure of water for support, and to offload joints by unweighting joint load by at least 50 % in navel deep water and by at least 75-80% in chest to neck deep water.  Viscosity of the water is needed for resistance of strengthening. Water current perturbations provides challenge to standing balance requiring increased core activation.     PATIENT EDUCATION:  Education details: Discussed eval findings, rehab rationale and POC and patient is in agreement  Person educated: Patient Education method: Explanation Education comprehension: verbalized understanding  HOME EXERCISE PROGRAM: [id number] from last episode.  ASSESSMENT:  CLINICAL IMPRESSION: Addressed STG's.  Most not yet met due to few sessions complete in PT.  She reports being able to stand and shower for up to 10 mins but sits and stands in kitchen while completing chores, an improvement. She walks to and from session today adding distance and time to amb and reports decrease in overall pain. She does require a few standing rest periods but is encouraged to continue to advance her toleration. Will plan on re-testing objective measures on 6th visit. She is progressing well and will continue to benefit from skilled aquatic therapy intervention  Patient is a 57 y.o. F who was seen today for physical therapy evaluation and treatment for ICU myopathy. She also continues with Post covid issues.  She had begun therapy a few  months ago after a bout with pneumonia which at that time had not completely cleared so she stopped coming. Today she presents with muscle weakness, decreased toleration to amb/activity, discomfort in knees from OA as well as SOB with minimal  exertion.  There is a suspicion that she has a lung cancer  which will be determined tomorrow with scans at [facility]. (Other areas of concern is vagina and esophagus as they did show markers with recent scan).  I am not certain how well pt will tolerate aquatic intervention.  She is a good candidate for skilled physical therapy if tolerated, will benefit from the properties of water to improve strength, balance toleration to activity improving functional mobility and safety.  OBJECTIVE IMPAIRMENTS: Abnormal gait, decreased activity tolerance, decreased balance, decreased endurance, decreased knowledge of use of DME, decreased mobility, difficulty walking, decreased strength, impaired sensation, postural dysfunction, obesity, and pain.   ACTIVITY LIMITATIONS: carrying, lifting, bending, sitting, standing, squatting, stairs, transfers, and reach over head  PARTICIPATION LIMITATIONS: meal prep, cleaning, laundry, driving, shopping, and community activity  PERSONAL FACTORS: Fitness, Time since onset of injury/illness/exacerbation, and 3+ comorbidities: post covid, ICU myopathy, possible ca  are also affecting patient's functional outcome.   REHAB POTENTIAL: Good  CLINICAL DECISION MAKING: Unstable/unpredictable  EVALUATION COMPLEXITY: Moderate   GOALS: Goals reviewed with patient? Yes  SHORT TERM GOALS: Target date: 03/09/22 Pt to tolerate session of aquatic therapy with rest periods consistently for 1 month to demonstrate pt toleration to intervention. Baseline:TBA Goal status: ongoing 03/09/22  2.  Pt to complete 2 minute walk test without decrease in O2 sats <90%. Baseline:  Goal status: ongoing 03/09/22  3.  Pt will report improved toleration to  standing  (cooking, doing dishes) up to 5 minutes without needing rest period to demonstrate improving activity toleration. Baseline: <3 mins Goal status: Met 03/09/22  4.  Pt to improve in strength by at least 1/2 grade LE to improve functional mobility and ADL's Baseline: see chart above Goal status: ongoing  5.  Pt will report a decrease in knee pain no higher than 5/10 Baseline: see chart Goal status: ongoing    LONG TERM GOALS: Target date: 04/06/22  Pt will meet stated Foto goal Baseline: see test Goal status: INITIAL  2.  Pt will walk 600 ft in 2  minutes to demonstrate improved toleration (norm) Baseline: tba Goal status: INITIAL  3.  Pt will improve on 5 X STS test to < 13s to demonstrate improved mobility and strength Baseline: 19.43 Goal status: INITIAL  4.  Pt will tolerate walking one way to pool and session without reports of excessive fatigue Baseline: Pt using wc to and from Goal status: INITIAL  5.  Pt will be indep with final HEP to continue with management of condition. Baseline: initial HEP Goal status: INITIAL     PLAN:  PT FREQUENCY: 1-2x/week  PT DURATION: 8 weeks  PLANNED INTERVENTIONS: Therapeutic exercises, Therapeutic activity, Neuromuscular re-education, Balance training, Gait training, Patient/Family education, Self Care, Joint mobilization, Stair training, DME instructions, Aquatic Therapy, Dry Needling, Electrical stimulation, Cryotherapy, Moist heat, Taping, Ultrasound, Ionotophoresis '4mg'$ /ml Dexamethasone, Manual therapy, and Re-evaluation  PLAN FOR NEXT SESSION: Initiate aquatic therapy working walking toleration; endurance/aerobic capacity, strengthening LE and core.  Allow for multiple rest periods as needed.  Monitor O2 sats and pulse throughout.   Denton Meek, PTMPT 03/09/2022, 11:28 AM

## 2022-03-11 ENCOUNTER — Encounter (HOSPITAL_BASED_OUTPATIENT_CLINIC_OR_DEPARTMENT_OTHER): Payer: Self-pay

## 2022-03-11 ENCOUNTER — Ambulatory Visit (HOSPITAL_BASED_OUTPATIENT_CLINIC_OR_DEPARTMENT_OTHER): Payer: Medicare HMO | Admitting: Physical Therapy

## 2022-03-12 ENCOUNTER — Telehealth (HOSPITAL_BASED_OUTPATIENT_CLINIC_OR_DEPARTMENT_OTHER): Payer: Self-pay | Admitting: Physical Therapy

## 2022-03-12 NOTE — Telephone Encounter (Signed)
Kaitlin [name] called to let us know that she had shingles and it's just getting over it. She says that the wounds have not healed completely so, she went ahead and canceled her appointments for next week and will let us know if the week after that she is ready to go back in the water.

## 2022-03-15 ENCOUNTER — Ambulatory Visit (HOSPITAL_BASED_OUTPATIENT_CLINIC_OR_DEPARTMENT_OTHER): Payer: Medicare HMO | Admitting: Physical Therapy

## 2022-03-18 ENCOUNTER — Ambulatory Visit (HOSPITAL_BASED_OUTPATIENT_CLINIC_OR_DEPARTMENT_OTHER): Payer: Self-pay | Admitting: Physical Therapy

## 2022-03-22 ENCOUNTER — Ambulatory Visit (HOSPITAL_BASED_OUTPATIENT_CLINIC_OR_DEPARTMENT_OTHER): Payer: Medicare HMO | Attending: Physical Medicine and Rehabilitation | Admitting: Physical Therapy

## 2022-03-22 ENCOUNTER — Encounter (HOSPITAL_BASED_OUTPATIENT_CLINIC_OR_DEPARTMENT_OTHER): Payer: Self-pay | Admitting: Physical Therapy

## 2022-03-22 DIAGNOSIS — Z7409 Other reduced mobility: Secondary | ICD-10-CM | POA: Insufficient documentation

## 2022-03-22 DIAGNOSIS — G7281 Critical illness myopathy: Secondary | ICD-10-CM | POA: Insufficient documentation

## 2022-03-22 DIAGNOSIS — R293 Abnormal posture: Secondary | ICD-10-CM | POA: Diagnosis present

## 2022-03-22 DIAGNOSIS — U099 Post covid-19 condition, unspecified: Secondary | ICD-10-CM | POA: Diagnosis present

## 2022-03-22 DIAGNOSIS — R2681 Unsteadiness on feet: Secondary | ICD-10-CM | POA: Diagnosis present

## 2022-03-22 NOTE — Therapy (Signed)
OUTPATIENT PHYSICAL THERAPY LOWER EXTREMITY TREATMENT   Patient Name: Kaitlin [name] MRN: [id number] DOB:[birth year], 57 y.o., female Today's Date: 03/22/2022  END OF SESSION:  PT End of Session - 03/22/22 1047     Visit Number 4    Number of Visits 12    Date for PT Re-Evaluation 04/06/22    Authorization Type Medicare    Authorization Time Period 02/09/22 - 04/06/22    Progress Note Due on Visit 10    PT Start Time 1032    PT Stop Time 1111    PT Time Calculation (min) 39 min    Activity Tolerance Patient tolerated treatment well;Patient limited by fatigue    Behavior During Therapy [facility] for tasks assessed/performed             Past Medical History:  Diagnosis Date   Anemia    Bronchitis    Cataracts, bilateral 01/12/2018   CHF (congestive heart failure) ([place])    Diabetes mellitus    High cholesterol    Hypertension    takes medicine to protect kidneys, does not have HTN   Leukocytosis    Morbid obesity ([place]) 09/11/2011   BMI 57   Neuropathy 01/12/2018   NICM (nonischemic cardiomyopathy) ([place]) 05/01/2013   Overview:  Last Assessment & Plan:  euvolemic on physical exam.    Obesity    Sickle cell trait ([place])    Thymoma    Past Surgical History:  Procedure Laterality Date   COLONOSCOPY N/A 06/21/2017   Procedure: COLONOSCOPY;  Surgeon: Ronnette Juniper, MD;  Location: [facility];  Service: Gastroenterology;  Laterality: N/A;   LEFT HEART CATHETERIZATION WITH CORONARY ANGIOGRAM N/A 05/25/2013   Procedure: LEFT HEART CATHETERIZATION WITH CORONARY ANGIOGRAM;  Surgeon: Troy Sine, MD;  Location: [facility] CATH LAB;  Service: Cardiovascular;  Laterality: N/A;   LYMPH NODE BIOPSY Right 11/30/2012   Procedure: RIGHT TONSIL BIOPSY WITH FRESH FROZEN ANALYSIS;  Surgeon: Jodi Marble, MD;  Location: [place];  Service: ENT;  Laterality: Right;   MEDIASTERNOTOMY N/A 01/12/2013   Procedure: MEDIAN STERNOTOMY;  Surgeon: Gaye Pollack, MD;  Location: [place];  Service: Thoracic;  Laterality:  N/A;   MEDIASTINOTOMY CHAMBERLAIN MCNEIL    Right 11/06/2012   Procedure: MEDIASTINOTOMY CHAMBERLAIN MCNEIL PROCEDURE;  Surgeon: Gaye Pollack, MD;  Location: [place];  Service: Thoracic;  Laterality: Right;   POLYPECTOMY  06/21/2017   Procedure: POLYPECTOMY;  Surgeon: Ronnette Juniper, MD;  Location: [facility];  Service: Gastroenterology;;   RESECTION OF A THYMOMA N/A 01/12/2013   Procedure: RESECTION OF A THYMOMA;  Surgeon: Gaye Pollack, MD;  Location: [place];  Service: Thoracic;  Laterality: N/A;   TONSILLECTOMY     TUBAL LIGATION     Patient Active Problem List   Diagnosis Date Noted   Long COVID 01/02/2020   Mixed stress and urge urinary incontinence 07/04/2019   Nerve pain 05/16/2019   Myofascial pain 05/16/2019   Subluxation of acromioclavicular joint, right, sequela 03/23/2019   Sickle cell trait ([place])    Labile blood glucose    Diabetic peripheral neuropathy (HCC)    Hypokalemia    Pain in joint of left shoulder    Debility    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   COVID-19    History of anemia    Super obese    Poorly controlled type 2 diabetes mellitus with peripheral neuropathy ([place])    Acute on chronic diastolic (congestive) heart failure ([place])    Hypernatremia  Shock circulatory ([place])    Septic shock ([place])    Hypotension 01/13/2019   Acute respiratory failure with hypoxemia (HCC) 01/13/2019   Acute respiratory distress syndrome (ARDS) due to COVID-19 virus (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   Dyspnea on exertion 05/19/2016   Other fatigue 05/19/2016   Absolute anemia 04/15/2016   Left leg weakness 10/18/2014   Left leg paresthesias 10/18/2014   Acute left-sided weakness 10/18/2014   Encounter for central line placement 03/08/2014   Taking drug for chronic disease 03/08/2014   H/O insertion of insulin pump 02/18/2014   Long term current use of insulin ([place]) 02/18/2014   Insulin pump in place  02/18/2014   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   Abnormal ballistocardiogram 05/18/2013   Abnormal nuclear stress test 05/18/2013   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   NICM (nonischemic cardiomyopathy) ([place]) 05/01/2013   Insulin dependent type 2 diabetes mellitus ([place]) 04/10/2013   Sex counseling 04/10/2013   Edema leg 04/10/2013   Upper respiratory infection, viral 04/10/2013   Pedal edema 03/29/2013   Pneumonia 12/12/2012   Essential hypertension 12/12/2012   HLD (hyperlipidemia) 12/12/2012   Tonsillar mass 12/01/2012   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   Leukocytosis    Anemia    High cholesterol    Hypertension    Blurred vision 09/11/2011   Foot pain 09/11/2011   Breast pain 09/11/2011   Morbid obesity ([place]) 09/11/2011   Fungal infection of nail 09/11/2011   Avitaminosis D 09/11/2011    PCP: Curt Jews,  PA-c REFERRING PROVIDER:   Courtney Heys, MD    REFERRING DIAG: G72.81 (ICD-10-CM) - Intensive care (ICU) myopathy   THERAPY DIAG:  Critical illness myopathy  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 Pain is not too bad.  I had shingles and that's why I missed the last couple sessions.  PERTINENT HISTORY: Dr Dagoberto Ligas:  They are concerned pt has lung Cancer.  Plaque like right lower lobe pleural based nodularity.   Pt had ICU myopathy and is still very weak generalized- also has BMI >50- needs aquatic therapy- per her cardiologist for conditioning  PAIN:  Are you having pain? Yes: NPRS scale: current 4-5/10; worst 8-9/10; 0/10 Pain location: knees R>L ; right chest area with exersion (breathing) Pain description: ache, tingling Aggravating factors: stair climbing; walking 3 minutes Relieving factors:  lying sleeping, tylenol  PRECAUTIONS: Knee and Fall  WEIGHT BEARING RESTRICTIONS: No  FALLS:  Has patient fallen in last 6 months? No  LIVING ENVIRONMENT: Lives with: lives with their family Lives in: House/apartment Stairs: Yes: Internal: 16 steps; on left going up Has following equipment at home: None  OCCUPATION: disability  PLOF: Independent  PATIENT GOALS: to build up my strength, being able to walk without needing to sit every 2-3 minutes, stand to cook or clean.   NEXT MD VISIT: 02/10/22  OBJECTIVE:   DIAGNOSTIC FINDINGS: none relevant in chart  PATIENT SURVEYS:  FOTO to be complete next visit. 03/02/22: Primary 38%; Risk adjusted 49%; goal 51% visit 12  COGNITION: Overall cognitive status: Within functional limits for tasks assessed     SENSATION: Periphreal neuropathies mid calf to feet and hands   POSTURE: rounded shoulders, forward head, and flexed trunk  LOWER EXTREMITY ROM:  WFL  LOWER EXTREMITY MMT:  MMT Right eval Left eval  Hip flexion Seated 3+ 3  Hip extension 3 standing 3  Hip abduction 4 4  Hip adduction 4 4  Hip internal rotation    Hip external rotation    Knee flexion 4- 3+  Knee extension 4- 4-  Ankle dorsiflexion    Ankle plantarflexion    Ankle inversion    Ankle eversion     (Blank rows = not tested)  FUNCTIONAL TESTS:  5 times sit to stand: 19.43 from wc: pulse 114 O2 sats 94%   VITALS: 3/11:   during rest/exercise 95-112 bpm,  95-97% spO2  GAIT: Distance walked: 40 ft Assistive device utilized: None Level of assistance: SBA Comments: cadence slowed, step length short; increased lateral displacement   TODAY'S TREATMENT:                                                                                                                              Pt seen for aquatic therapy today.  Treatment took place in water 3 ft 6" in depth at the [facility]. Temp of water was 91.  Pt entered/exited the pool  via stairs using alternating pattern with bilat hand rail.  *Without support in 3.6 ft depth: 3 laps of each forward, backward, and side stepping (short standing/seated rest breaks in between each direction) * holding wall: hip abdct/ addct x 5 x 3;  L stretch x 3;  hip flexor stretch x 10s each  * seated rest break  * holding wall:  hip ext to toe touch x 10 each;  heel raises x 5; squats x 5  * seated rest break  * holding wall: heel raises x 5; squats x 5 *Unsupported walking backward/forward (2 total laps) with arm swing *  seated with LE cycling (on bench) x 3.5 min   Pt requires the buoyancy and hydrostatic pressure of water for support, and to offload joints by unweighting joint load by at least 50 % in navel deep water and by at least 75-80% in chest to neck deep water.  Viscosity of the water is needed for resistance of strengthening. Water current perturbations provides challenge to standing balance requiring increased core activation.   PATIENT EDUCATION:  Education details: aquatic modifications;  energy conservation/management Person educated: Patient Education method: Explanation Education comprehension: verbalized understanding  HOME EXERCISE PROGRAM: [id number] from last episode.  ASSESSMENT:  CLINICAL IMPRESSION: Pt has been away from therapy due to illness.  She does require a few seated rest periods during session due to fatigue. No increase in pain during session. Vitals monitored.  Will introduce RPE next visit. Will plan on re-testing objective measures on 6th visit. She is a good candidate for skilled physical therapy if tolerated, will benefit from the properties of water to improve strength, balance toleration to activity improving functional mobility and safety.  OBJECTIVE IMPAIRMENTS: Abnormal gait, decreased activity  tolerance, decreased balance, decreased endurance, decreased knowledge of use of DME, decreased mobility, difficulty walking, decreased strength,  impaired sensation, postural dysfunction, obesity, and pain.   ACTIVITY LIMITATIONS: carrying, lifting, bending, sitting, standing, squatting, stairs, transfers, and reach over head  PARTICIPATION LIMITATIONS: meal prep, cleaning, laundry, driving, shopping, and community activity  PERSONAL FACTORS: Fitness, Time since onset of injury/illness/exacerbation, and 3+ comorbidities: post covid, ICU myopathy, possible ca  are also affecting patient's functional outcome.   REHAB POTENTIAL: Good  CLINICAL DECISION MAKING: Unstable/unpredictable  EVALUATION COMPLEXITY: Moderate   GOALS: Goals reviewed with patient? Yes  SHORT TERM GOALS: Target date: 03/09/22 Pt to tolerate session of aquatic therapy with rest periods consistently for 1 month to demonstrate pt toleration to intervention. Baseline:TBA Goal status: ongoing 03/09/22  2.  Pt to complete 2 minute walk test without decrease in O2 sats <90%. Baseline:  Goal status: ongoing 03/09/22  3.  Pt will report improved toleration to standing  (cooking, doing dishes) up to 5 minutes without needing rest period to demonstrate improving activity toleration. Baseline: <3 mins Goal status: Met 03/09/22  4.  Pt to improve in strength by at least 1/2 grade LE to improve functional mobility and ADL's Baseline: see chart above Goal status: ongoing  5.  Pt will report a decrease in knee pain no higher than 5/10 Baseline: see chart Goal status: ongoing    LONG TERM GOALS: Target date: 04/06/22  Pt will meet stated Foto goal Baseline: see test Goal status: INITIAL  2.  Pt will walk 600 ft in 2  minutes to demonstrate improved toleration (norm) Baseline: tba Goal status: INITIAL  3.  Pt will improve on 5 X STS test to < 13s to demonstrate improved mobility and strength Baseline: 19.43 Goal status: INITIAL  4.  Pt will tolerate walking one way to pool and session without reports of excessive fatigue Baseline: Pt using wc to and  from Goal status: INITIAL  5.  Pt will be indep with final HEP to continue with management of condition. Baseline: initial HEP Goal status: INITIAL     PLAN:  PT FREQUENCY: 1-2x/week  PT DURATION: 8 weeks  PLANNED INTERVENTIONS: Therapeutic exercises, Therapeutic activity, Neuromuscular re-education, Balance training, Gait training, Patient/Family education, Self Care, Joint mobilization, Stair training, DME instructions, Aquatic Therapy, Dry Needling, Electrical stimulation, Cryotherapy, Moist heat, Taping, Ultrasound, Ionotophoresis '4mg'$ /ml Dexamethasone, Manual therapy, and Re-evaluation  PLAN FOR NEXT SESSION: Initiate aquatic therapy working walking toleration; endurance/aerobic capacity, strengthening LE and core.  Allow for multiple rest periods as needed.  Monitor O2 sats and pulse throughout.  Kerin Perna, PTA 03/22/22 11:18 AM [facility] [address] [place], [place], [address] Phone: [phone]   Fax:  [phone]

## 2022-03-23 ENCOUNTER — Other Ambulatory Visit: Payer: Self-pay | Admitting: Physical Medicine and Rehabilitation

## 2022-03-25 ENCOUNTER — Encounter (HOSPITAL_BASED_OUTPATIENT_CLINIC_OR_DEPARTMENT_OTHER): Payer: Self-pay | Admitting: Physical Therapy

## 2022-03-25 ENCOUNTER — Ambulatory Visit (HOSPITAL_BASED_OUTPATIENT_CLINIC_OR_DEPARTMENT_OTHER): Payer: Medicare HMO | Admitting: Physical Therapy

## 2022-03-25 DIAGNOSIS — R293 Abnormal posture: Secondary | ICD-10-CM

## 2022-03-25 DIAGNOSIS — G7281 Critical illness myopathy: Secondary | ICD-10-CM | POA: Diagnosis not present

## 2022-03-25 DIAGNOSIS — R2681 Unsteadiness on feet: Secondary | ICD-10-CM

## 2022-03-25 DIAGNOSIS — Z7409 Other reduced mobility: Secondary | ICD-10-CM

## 2022-03-25 NOTE — Therapy (Signed)
OUTPATIENT PHYSICAL THERAPY LOWER EXTREMITY TREATMENT   Patient Name: Kaitlin [name] MRN: [id number] DOB:01/17/1965, 57 y.o., female Today's Date: 03/25/2022  END OF SESSION:  PT End of Session - 03/25/22 0905     Visit Number 5    Number of Visits 12    Date for PT Re-Evaluation 04/06/22    Authorization Type Medicare    Authorization Time Period 02/09/22 - 04/06/22    Progress Note Due on Visit 10    PT Start Time 0900    PT Stop Time 0940    PT Time Calculation (min) 40 min    Activity Tolerance Patient limited by fatigue    Behavior During Therapy [facility] for tasks assessed/performed             Past Medical History:  Diagnosis Date   Anemia    Bronchitis    Cataracts, bilateral 01/12/2018   CHF (congestive heart failure) (HCC)    Diabetes mellitus    High cholesterol    Hypertension    takes medicine to protect kidneys, does not have HTN   Leukocytosis    Morbid obesity ([place]) 09/11/2011   BMI 57   Neuropathy 01/12/2018   NICM (nonischemic cardiomyopathy) ([place]) 05/01/2013   Overview:  Last Assessment & Plan:  euvolemic on physical exam.    Obesity    Sickle cell trait ([place])    Thymoma    Past Surgical History:  Procedure Laterality Date   COLONOSCOPY N/A 06/21/2017   Procedure: COLONOSCOPY;  Surgeon: Ronnette Juniper, MD;  Location: [facility];  Service: Gastroenterology;  Laterality: N/A;   LEFT HEART CATHETERIZATION WITH CORONARY ANGIOGRAM N/A 05/25/2013   Procedure: LEFT HEART CATHETERIZATION WITH CORONARY ANGIOGRAM;  Surgeon: Troy Sine, MD;  Location: [facility] CATH LAB;  Service: Cardiovascular;  Laterality: N/A;   LYMPH NODE BIOPSY Right 11/30/2012   Procedure: RIGHT TONSIL BIOPSY WITH FRESH FROZEN ANALYSIS;  Surgeon: Jodi Marble, MD;  Location: [place];  Service: ENT;  Laterality: Right;   MEDIASTERNOTOMY N/A 01/12/2013   Procedure: MEDIAN STERNOTOMY;  Surgeon: Gaye Pollack, MD;  Location: [place];  Service: Thoracic;  Laterality: N/A;   MEDIASTINOTOMY CHAMBERLAIN  MCNEIL    Right 11/06/2012   Procedure: MEDIASTINOTOMY CHAMBERLAIN MCNEIL PROCEDURE;  Surgeon: Gaye Pollack, MD;  Location: [place];  Service: Thoracic;  Laterality: Right;   POLYPECTOMY  06/21/2017   Procedure: POLYPECTOMY;  Surgeon: Ronnette Juniper, MD;  Location: [facility];  Service: Gastroenterology;;   RESECTION OF A THYMOMA N/A 01/12/2013   Procedure: RESECTION OF A THYMOMA;  Surgeon: Gaye Pollack, MD;  Location: [place];  Service: Thoracic;  Laterality: N/A;   TONSILLECTOMY     TUBAL LIGATION     Patient Active Problem List   Diagnosis Date Noted   Long COVID 01/02/2020   Mixed stress and urge urinary incontinence 07/04/2019   Nerve pain 05/16/2019   Myofascial pain 05/16/2019   Subluxation of acromioclavicular joint, right, sequela 03/23/2019   Sickle cell trait ([place])    Labile blood glucose    Diabetic peripheral neuropathy (HCC)    Hypokalemia    Pain in joint of left shoulder    Debility    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   COVID-19    History of anemia    Super obese    Poorly controlled type 2 diabetes mellitus with peripheral neuropathy ([place])    Acute on chronic diastolic (congestive) heart failure ([place])    Hypernatremia    Shock circulatory ([place])  Septic shock (HCC)    Hypotension 01/13/2019   Acute respiratory failure with hypoxemia (HCC) 01/13/2019   Acute respiratory distress syndrome (ARDS) due to COVID-19 virus ([place]) 01/12/2019   Grade I diastolic dysfunction [id number]   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   Dyspnea on exertion 05/19/2016   Other fatigue 05/19/2016   Absolute anemia 04/15/2016   Left leg weakness 10/18/2014   Left leg paresthesias 10/18/2014   Acute left-sided weakness 10/18/2014   Encounter for central line placement 03/08/2014   Taking drug for chronic disease 03/08/2014   H/O insertion of insulin pump 02/18/2014   Long term current use of insulin ([place]) 02/18/2014   Insulin pump in place 02/18/2014   Bernhardt's paresthesia  06/18/2013   Abnormal ballistocardiogram 05/18/2013   Abnormal nuclear stress test 05/18/2013   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   NICM (nonischemic cardiomyopathy) ([place]) 05/01/2013   Insulin dependent type 2 diabetes mellitus ([place]) 04/10/2013   Sex counseling 04/10/2013   Edema leg 04/10/2013   Upper respiratory infection, viral 04/10/2013   Pedal edema 03/29/2013   Pneumonia 12/12/2012   Essential hypertension 12/12/2012   HLD (hyperlipidemia) 12/12/2012   Tonsillar mass 12/01/2012   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   Leukocytosis    Anemia    High cholesterol    Hypertension    Blurred vision 09/11/2011   Foot pain 09/11/2011   Breast pain 09/11/2011   Morbid obesity ([place]) 09/11/2011   Fungal infection of nail 09/11/2011   Avitaminosis D 09/11/2011    PCP: Curt Jews,  PA-c REFERRING PROVIDER:   Courtney Heys, MD    REFERRING DIAG: G72.81 (ICD-10-CM) - Intensive care (ICU) myopathy   THERAPY DIAG:  Critical illness myopathy  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 I had to lay down and rest after last session; I was tired.  I felt a little sluggish the next day.  I'm almost up to standing 3 minutes in the kitchen, but not quite there yet.   PERTINENT HISTORY: Dr Dagoberto Ligas:  They are concerned pt has lung Cancer.  Plaque like right lower lobe pleural based nodularity.   Pt had ICU myopathy and is still very weak generalized- also has BMI >50- needs aquatic therapy- per her cardiologist for conditioning  PAIN:  Are you having pain? Yes: NPRS scale: current 4/10 Pain location: lower back, R knee  Pain description: ache, tingling Aggravating factors: stair climbing; walking 3 minutes Relieving factors: lying  sleeping, tylenol  PRECAUTIONS: Knee and Fall  WEIGHT BEARING RESTRICTIONS: No  FALLS:  Has patient fallen in last 6 months? No  LIVING ENVIRONMENT: Lives with: lives with their family Lives in: House/apartment Stairs: Yes: Internal: 16 steps; on left going up Has following equipment at home: None  OCCUPATION: disability  PLOF: Independent  PATIENT GOALS: to build up my strength, being able to walk without needing to sit every 2-3 minutes, stand to cook or clean.   NEXT MD VISIT: 02/10/22  OBJECTIVE:   DIAGNOSTIC FINDINGS: none relevant in chart  PATIENT SURVEYS:  FOTO to be complete next visit. 03/02/22: Primary 38%; Risk adjusted 49%; goal 51% visit 12  COGNITION: Overall cognitive status: Within functional limits for tasks assessed     SENSATION: Periphreal neuropathies mid calf to feet and hands  POSTURE: rounded shoulders, forward head, and flexed trunk    LOWER EXTREMITY ROM:  WFL  LOWER EXTREMITY MMT:  MMT Right eval Left eval  Hip flexion Seated 3+ 3  Hip extension 3 standing 3  Hip abduction 4 4  Hip adduction 4 4  Hip internal rotation    Hip external rotation    Knee flexion 4- 3+  Knee extension 4- 4-  Ankle dorsiflexion    Ankle plantarflexion    Ankle inversion    Ankle eversion     (Blank rows = not tested)  FUNCTIONAL TESTS:  5 times sit to stand: 19.43 from wc: pulse 114 O2 sats 94%   VITALS: 3/11:   during rest/exercise 95-112 bpm,  95-97% spO2 3//14:  104-124bpm, spO2 95-97% GAIT: Distance walked: 40 ft Assistive device utilized: None Level of assistance: SBA Comments: cadence slowed, step length short; increased lateral displacement   TODAY'S TREATMENT:                                                                                                                              Pt seen for aquatic therapy today.  Treatment took place in water 3 ft 6" in depth at the [place]. Temp of water was 91.  Pt  entered/exited the pool via stairs using alternating pattern with bilat hand rail.  *Without support in 3.6 ft depth: 2 laps of each forward, backward, and side stepping -with short seated rest break after 6 total laps * seated LE cycling 30 sec x 3 - resting after each round * side stepping with arm addct with rainbow hand floats x 2 laps (HR up to 124 bpm, spO2 95%)  * holding wall: hip abdct/ addct x 5; Hip ext to toe touch x 5;  B heel raises x 5; relaxed squats x 5;   L stretch x 3 -  * seated rest break; then repeated above circuit again * seated rest break * long sitting hip abdct/addct 2 x10;  rest; alternating LAQ with DF x 10 each LE * marching with reciprocal arm swing x 1 lap, x 2    Pt requires the buoyancy and hydrostatic pressure of water for support, and to offload joints by unweighting joint load by at least 50 % in navel deep water and by at least 75-80% in chest to neck deep water.  Viscosity of the water is needed for resistance of strengthening. Water current perturbations provides challenge to standing balance requiring increased core activation.   PATIENT EDUCATION:  Education details: aquatic modifications;  energy conservation/management; issued mod borg scale Person educated: Patient Education method: Explanation Education comprehension: verbalized understanding  HOME EXERCISE PROGRAM: [id number] from last episode.  ASSESSMENT:  CLINICAL IMPRESSION: She does require a few seated rest periods during session due to fatigue. Back pain remained unchanged during session. Vitals monitored; will include RPE next visit. Discussed energy conservation measures.  Will plan on re-testing objective measures on  6th visit. She is a good candidate for skilled physical therapy if tolerated, will benefit from the properties of water to improve strength, balance toleration to activity improving functional mobility and safety.  OBJECTIVE IMPAIRMENTS: Abnormal gait, decreased activity  tolerance, decreased balance, decreased endurance, decreased knowledge of use of DME, decreased mobility, difficulty walking, decreased strength, impaired sensation, postural dysfunction, obesity, and pain.   ACTIVITY LIMITATIONS: carrying, lifting, bending, sitting, standing, squatting, stairs, transfers, and reach over head  PARTICIPATION LIMITATIONS: meal prep, cleaning, laundry, driving, shopping, and community activity  PERSONAL FACTORS: Fitness, Time since onset of injury/illness/exacerbation, and 3+ comorbidities: post covid, ICU myopathy, possible ca  are also affecting patient's functional outcome.   REHAB POTENTIAL: Good  CLINICAL DECISION MAKING: Unstable/unpredictable  EVALUATION COMPLEXITY: Moderate   GOALS: Goals reviewed with patient? Yes  SHORT TERM GOALS: Target date: 03/09/22 Pt to tolerate session of aquatic therapy with rest periods consistently for 1 month to demonstrate pt toleration to intervention. Baseline:TBA Goal status: ongoing 03/09/22  2.  Pt to complete 2 minute walk test without decrease in O2 sats <90%. Baseline:  Goal status: ongoing 03/09/22  3.  Pt will report improved toleration to standing  (cooking, doing dishes) up to 5 minutes without needing rest period to demonstrate improving activity toleration. Baseline: <3 mins Goal status: Met 03/09/22  4.  Pt to improve in strength by at least 1/2 grade LE to improve functional mobility and ADL's Baseline: see chart above Goal status: ongoing  5.  Pt will report a decrease in knee pain no higher than 5/10 Baseline: see chart Goal status: ongoing    LONG TERM GOALS: Target date: 04/06/22  Pt will meet stated Foto goal Baseline: see test Goal status: INITIAL  2.  Pt will walk 600 ft in 2  minutes to demonstrate improved toleration (norm) Baseline: tba Goal status: INITIAL  3.  Pt will improve on 5 X STS test to < 13s to demonstrate improved mobility and strength Baseline: 19.43 Goal  status: INITIAL  4.  Pt will tolerate walking one way to pool and session without reports of excessive fatigue Baseline: Pt using wc to and from Goal status: INITIAL  5.  Pt will be indep with final HEP to continue with management of condition. Baseline: initial HEP Goal status: INITIAL     PLAN:  PT FREQUENCY: 1-2x/week  PT DURATION: 8 weeks  PLANNED INTERVENTIONS: Therapeutic exercises, Therapeutic activity, Neuromuscular re-education, Balance training, Gait training, Patient/Family education, Self Care, Joint mobilization, Stair training, DME instructions, Aquatic Therapy, Dry Needling, Electrical stimulation, Cryotherapy, Moist heat, Taping, Ultrasound, Ionotophoresis '4mg'$ /ml Dexamethasone, Manual therapy, and Re-evaluation  PLAN FOR NEXT SESSION: Initiate aquatic therapy working walking toleration; endurance/aerobic capacity, strengthening LE and core.  Allow for multiple rest periods as needed.  Monitor O2 sats and pulse throughout.  Kerin Perna, PTA 03/25/22 9:53 AM [facility] [address] [place], [place], [address] Phone: [phone]   Fax:  [phone]

## 2022-03-29 ENCOUNTER — Encounter: Payer: Self-pay | Admitting: Physical Medicine and Rehabilitation

## 2022-03-29 ENCOUNTER — Encounter: Payer: Medicare HMO | Attending: Physical Medicine and Rehabilitation | Admitting: Physical Medicine and Rehabilitation

## 2022-03-29 ENCOUNTER — Ambulatory Visit (HOSPITAL_BASED_OUTPATIENT_CLINIC_OR_DEPARTMENT_OTHER): Payer: Medicare HMO | Admitting: Physical Therapy

## 2022-03-29 VITALS — BP 146/84 | HR 99 | Ht 67.0 in | Wt 378.2 lb

## 2022-03-29 DIAGNOSIS — M792 Neuralgia and neuritis, unspecified: Secondary | ICD-10-CM | POA: Diagnosis present

## 2022-03-29 DIAGNOSIS — U099 Post covid-19 condition, unspecified: Secondary | ICD-10-CM | POA: Insufficient documentation

## 2022-03-29 DIAGNOSIS — M7918 Myalgia, other site: Secondary | ICD-10-CM | POA: Diagnosis not present

## 2022-03-29 MED ORDER — LIDOCAINE HCL 1 % IJ SOLN
3.0000 mL | Freq: Once | INTRAMUSCULAR | Status: AC
  Administered 2022-03-29: 3 mL

## 2022-03-29 NOTE — Patient Instructions (Signed)
Plan: Can take Sudafed- as needed- can help clear out sinuses some-   2.  Asking about recliner chair- unfortunately considered to be a piece of furniture- not medical equipment.    Patient here for trigger point injections for  Consent done and on chart.  Cleaned areas with alcohol and injected using a 27 gauge 1.5 inch needle  Injected 3cc- no wastage Using 1% Lidocaine with no EPI  Upper traps- B/L  Levators- B/L  Posterior scalenes Middle scalenes- B/L  Splenius Capitus- B/L  Pectoralis Major- B/L Rhomboids Infraspinatus Teres Major/minor Thoracic paraspinals Lumbar paraspinals Other injections-    Patient's level of pain prior was- 4/10 Current level of pain after injections is- usually takes longer to kick in.   There was no bleeding or complications.  Patient was advised to drink a lot of water on day after injections to flush system Will have increased soreness for 12-48 hours after injections.  Can use Lidocaine patches the day AFTER injections Can use theracane on day of injections in places didn't inject Can use heating pad 4-6 hours AFTER injections  3. Cannot prescribe incontinence supplies since doesn't have medicaid anymore  4. Discussed getting grandson- into school program for autism- he sounds likes he's on schedule to do so.   5. F/U in 3 months- for trigger point injections

## 2022-03-29 NOTE — Progress Notes (Signed)
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 Still has severe difficulty with endurance due to ICU myopathy.    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       Having sinus issues- feels like has Sinus infection.  Head is throbbing and hurts in all sinuses.  Taking allergy Med- Allegra- 1x/day- Is also on Atrovent nasal spray- takes usually, but forgot this AM.   Isn't taking Allegra -D- normally low BP not high.   Insurance is going to change- will have to pay copays to see me.  Going to [facility]- so wondering about copays?  Is it possible to get recliner chair?  Stopped incontinence supplies since not going to have Medicaid- incontinence has gotten better.    Income too much to get Medicaid- got raise at beginning of year with disability.  Only had due to COVID- wouldn't have had under normal circumstances Per Medicaid from what pt says.        Plan: Can take Sudafed- as needed- can help clear out sinuses some- unless allergic- she says she's not. -   2.  Asking about recliner chair- unfortunately considered to be a piece of furniture- not medical equipment.    Patient here for trigger point injections for  Consent done and on chart.  Cleaned areas with alcohol and injected using a 27 gauge 1.5 inch needle  Injected 3cc- no wastage Using 1% Lidocaine with no EPI  Upper traps- B/L  Levators- B/L  Posterior scalenes Middle scalenes- B/L  Splenius Capitus- B/L  Pectoralis Major- B/L Rhomboids Infraspinatus Teres Major/minor Thoracic paraspinals Lumbar paraspinals Other injections-    Patient's level of pain prior was- 4/10 Current level of pain after injections is- usually takes longer to kick in.   There was no bleeding or complications.  Patient was advised to drink a lot of water on day after injections to  flush system Will have increased soreness for 12-48 hours after injections.  Can use Lidocaine patches the day AFTER injections Can use theracane on day of injections in places didn't inject Can use heating pad 4-6 hours AFTER injections  3. Cannot prescribe incontinence supplies since doesn't have medicaid anymore  4. Discussed getting grandson- into school program for autism- he sounds likes he's on schedule to do so.   5. F/U in 3 months- for trigger point injections  I spent a total of    minutes on total care today- >50% coordination of care- due to

## 2022-03-30 ENCOUNTER — Ambulatory Visit (HOSPITAL_BASED_OUTPATIENT_CLINIC_OR_DEPARTMENT_OTHER): Payer: Medicare HMO | Admitting: Physical Therapy

## 2022-03-30 ENCOUNTER — Encounter (HOSPITAL_BASED_OUTPATIENT_CLINIC_OR_DEPARTMENT_OTHER): Payer: Self-pay

## 2022-03-31 ENCOUNTER — Ambulatory Visit (HOSPITAL_BASED_OUTPATIENT_CLINIC_OR_DEPARTMENT_OTHER): Payer: Medicare HMO | Admitting: Physical Therapy

## 2022-04-01 ENCOUNTER — Ambulatory Visit (HOSPITAL_BASED_OUTPATIENT_CLINIC_OR_DEPARTMENT_OTHER): Payer: Medicare HMO | Admitting: Physical Therapy

## 2022-04-01 ENCOUNTER — Encounter (HOSPITAL_BASED_OUTPATIENT_CLINIC_OR_DEPARTMENT_OTHER): Payer: Self-pay | Admitting: Physical Therapy

## 2022-04-01 DIAGNOSIS — G7281 Critical illness myopathy: Secondary | ICD-10-CM

## 2022-04-01 DIAGNOSIS — U099 Post covid-19 condition, unspecified: Secondary | ICD-10-CM

## 2022-04-01 DIAGNOSIS — R2681 Unsteadiness on feet: Secondary | ICD-10-CM

## 2022-04-01 NOTE — Therapy (Signed)
OUTPATIENT PHYSICAL THERAPY LOWER EXTREMITY TREATMENT   Patient Name: Kaitlin [name] MRN: [id number] DOB:02-Mar-1965, 57 y.o., female Today's Date: 04/01/2022  END OF SESSION:  PT End of Session - 04/01/22 0918     Visit Number 6    Number of Visits 12    Date for PT Re-Evaluation 04/06/22    Authorization Type Medicare    Authorization Time Period 02/09/22 - 04/06/22    Progress Note Due on Visit 10    PT Start Time 0900    PT Stop Time 0940    PT Time Calculation (min) 40 min    Activity Tolerance Patient limited by fatigue    Behavior During Therapy [facility] for tasks assessed/performed             Past Medical History:  Diagnosis Date   Anemia    Bronchitis    Cataracts, bilateral 01/12/2018   CHF (congestive heart failure) (HCC)    Diabetes mellitus    High cholesterol    Hypertension    takes medicine to protect kidneys, does not have HTN   Leukocytosis    Morbid obesity ([place]) 09/11/2011   BMI 57   Neuropathy 01/12/2018   NICM (nonischemic cardiomyopathy) ([place]) 05/01/2013   Overview:  Last Assessment & Plan:  euvolemic on physical exam.    Obesity    Sickle cell trait ([place])    Thymoma    Past Surgical History:  Procedure Laterality Date   COLONOSCOPY N/A 06/21/2017   Procedure: COLONOSCOPY;  Surgeon: Ronnette Juniper, MD;  Location: [facility];  Service: Gastroenterology;  Laterality: N/A;   LEFT HEART CATHETERIZATION WITH CORONARY ANGIOGRAM N/A 05/25/2013   Procedure: LEFT HEART CATHETERIZATION WITH CORONARY ANGIOGRAM;  Surgeon: Troy Sine, MD;  Location: [facility];  Service: Cardiovascular;  Laterality: N/A;   LYMPH NODE BIOPSY Right 11/30/2012   Procedure: RIGHT TONSIL BIOPSY WITH FRESH FROZEN ANALYSIS;  Surgeon: Jodi Marble, MD;  Location: [place];  Service: ENT;  Laterality: Right;   MEDIASTERNOTOMY N/A 01/12/2013   Procedure: MEDIAN STERNOTOMY;  Surgeon: Gaye Pollack, MD;  Location: [place];  Service: Thoracic;  Laterality: N/A;   MEDIASTINOTOMY CHAMBERLAIN  MCNEIL    Right 11/06/2012   Procedure: MEDIASTINOTOMY CHAMBERLAIN MCNEIL PROCEDURE;  Surgeon: Gaye Pollack, MD;  Location: [place];  Service: Thoracic;  Laterality: Right;   POLYPECTOMY  06/21/2017   Procedure: POLYPECTOMY;  Surgeon: Ronnette Juniper, MD;  Location: [facility];  Service: Gastroenterology;;   RESECTION OF A THYMOMA N/A 01/12/2013   Procedure: RESECTION OF A THYMOMA;  Surgeon: Gaye Pollack, MD;  Location: [place];  Service: Thoracic;  Laterality: N/A;   TONSILLECTOMY     TUBAL LIGATION     Patient Active Problem List   Diagnosis Date Noted   Long COVID 01/02/2020   Mixed stress and urge urinary incontinence 07/04/2019   Nerve pain 05/16/2019   Myofascial pain 05/16/2019   Subluxation of acromioclavicular joint, right, sequela 03/23/2019   Sickle cell trait ([place])    Labile blood glucose    Diabetic peripheral neuropathy (HCC)    Hypokalemia    Pain in joint of left shoulder    Debility    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   COVID-19    History of anemia    Super obese    Poorly controlled type 2 diabetes mellitus with peripheral neuropathy ([place])    Acute on chronic diastolic (congestive) heart failure ([place])    Hypernatremia    Shock circulatory ([place])  Septic shock (HCC)    Hypotension 01/13/2019   Acute respiratory failure with hypoxemia (HCC) 01/13/2019   Acute respiratory distress syndrome (ARDS) due to COVID-19 virus ([place]) 01/12/2019   Grade I diastolic dysfunction [id number]   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   Dyspnea on exertion 05/19/2016   Other fatigue 05/19/2016   Absolute anemia 04/15/2016   Left leg weakness 10/18/2014   Left leg paresthesias 10/18/2014   Acute left-sided weakness 10/18/2014   Encounter for central line placement 03/08/2014   Taking drug for chronic disease 03/08/2014   H/O insertion of insulin pump 02/18/2014   Long term current use of insulin ([place]) 02/18/2014   Insulin pump in place 02/18/2014   Bernhardt's paresthesia  06/18/2013   Abnormal ballistocardiogram 05/18/2013   Abnormal nuclear stress test 05/18/2013   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   NICM (nonischemic cardiomyopathy) ([place]) 05/01/2013   Insulin dependent type 2 diabetes mellitus ([place]) 04/10/2013   Sex counseling 04/10/2013   Edema leg 04/10/2013   Upper respiratory infection, viral 04/10/2013   Pedal edema 03/29/2013   Pneumonia 12/12/2012   Essential hypertension 12/12/2012   HLD (hyperlipidemia) 12/12/2012   Tonsillar mass 12/01/2012   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   Leukocytosis    Anemia    High cholesterol    Hypertension    Blurred vision 09/11/2011   Foot pain 09/11/2011   Breast pain 09/11/2011   Morbid obesity ([place]) 09/11/2011   Fungal infection of nail 09/11/2011   Avitaminosis D 09/11/2011    PCP: Curt Jews,  PA-c REFERRING PROVIDER:   Courtney Heys, MD    REFERRING DIAG: G72.81 (ICD-10-CM) - Intensive care (ICU) myopathy   THERAPY DIAG:  Critical illness myopathy  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 I feel sluggish today.  I've had a rough week. "     Pt reports she has had more chest congestion since Sunday.  She states she spoke to Dr, but needs an in-person visit to receive medication.   PERTINENT HISTORY: Dr Dagoberto Ligas:  They are concerned pt has lung Cancer.  Plaque like right lower lobe pleural based nodularity.   Pt had ICU myopathy and is still very weak generalized- also has BMI >50- needs aquatic therapy- per her cardiologist for conditioning  PAIN:  Are you having pain? Yes: NPRS scale: current 5/10 Pain location: lower back, R knee  Pain description: ache, tingling Aggravating factors: stair climbing; walking 3 minutes Relieving factors: lying  sleeping, tylenol  PRECAUTIONS: Knee and Fall  WEIGHT BEARING RESTRICTIONS: No  FALLS:  Has patient fallen in last 6 months? No  LIVING ENVIRONMENT: Lives with: lives with their family Lives in: House/apartment Stairs: Yes: Internal: 16 steps; on left going up Has following equipment at home: None  OCCUPATION: disability  PLOF: Independent  PATIENT GOALS: to build up my strength, being able to walk without needing to sit every 2-3 minutes, stand to cook or clean.   NEXT MD VISIT: 02/10/22  OBJECTIVE:   DIAGNOSTIC FINDINGS: none relevant in chart  PATIENT SURVEYS:  FOTO to be complete next visit. 03/02/22: Primary 38%; Risk adjusted 49%; goal 51% visit 12  COGNITION: Overall cognitive status: Within functional limits for tasks assessed     SENSATION: Periphreal neuropathies mid calf to feet and hands   POSTURE:  rounded shoulders, forward head, and flexed trunk    LOWER EXTREMITY ROM:  WFL  LOWER EXTREMITY MMT:  MMT Right eval Left eval  Hip flexion Seated 3+ 3  Hip extension 3 standing 3  Hip abduction 4 4  Hip adduction 4 4  Hip internal rotation    Hip external rotation    Knee flexion 4- 3+  Knee extension 4- 4-  Ankle dorsiflexion    Ankle plantarflexion    Ankle inversion    Ankle eversion     (Blank rows = not tested)  FUNCTIONAL TESTS:  5 times sit to stand: 19.43 from wc: pulse 114 O2 sats 94%   VITALS: 3/11:   during rest/exercise 95-112 bpm,  95-97% spO2 3//14:  104-124bpm, spO2 95-97% 04/01/22:   105-120 -93-97%  GAIT: Distance walked: 40 ft Assistive device utilized: None Level of assistance: SBA Comments: cadence slowed, step length short; increased lateral displacement   TODAY'S TREATMENT:                                                                                                                              Pt seen for aquatic therapy today.  Treatment took place in water 3 ft 6" in depth at the [facility]. Temp of water was 91.  Pt entered/exited the pool via stairs using alternating pattern with bilat hand rail.  *Without support in 3.6 ft depth: 2 laps of each forward, backward, seated rest break *  side stepping -with short seated rest break after 5 total laps * holding wall: hip abdct/ addct x 5; Hip ext to toe touch x 5;  B heel raises x 5; relaxed squats x 5;   L stretch x 3 * seated modified glute stretch x 20s each; LE cycling 90 sec  -  * tricep push down with yellow hand floats x 10 * kick board row with wide stance and staggered stance x 5 each * hip hinge with hands on yellow kick board and back 1 ft from wall x 5 (requires cues for form/ inc back pain) * seated rest break * standing TrA set with kick board press (3-5s) x 5,  short noodle press x 10 * seated rest break (discussed more about energy conservation)   Pt requires the buoyancy and hydrostatic pressure of water for support, and to offload joints by unweighting joint load by at least 50 % in navel deep water and by at least 75-80% in chest to neck deep water.  Viscosity of the water is needed for resistance of strengthening. Water current perturbations provides challenge to standing balance requiring increased core activation.   PATIENT EDUCATION:  Education details: aquatic modifications;  energy conservation/management Person educated: Patient Education method: Explanation Education comprehension: verbalized understanding  HOME EXERCISE PROGRAM: [id number] from last episode.  ASSESSMENT:  CLINICAL IMPRESSION: Pt requires seated rest periods after a few exercises during session due to fatigue. She reported no greater than  4/10 of exertion on modified Borg scale, however at times appears to be working harder than this.  Hip hinge and TrA set with noodle pull down increased low back pain.  Back pain lessened during seated rest breaks.  Insurance will be changing April 1, which may mean a change in her ability to  attend due to co-pays, etc.  Plan on re-testing objective measures on next visit. She is a good candidate for skilled physical therapy if tolerated, will benefit from the properties of water to improve strength, balance toleration to activity improving functional mobility and safety.  OBJECTIVE IMPAIRMENTS: Abnormal gait, decreased activity tolerance, decreased balance, decreased endurance, decreased knowledge of use of DME, decreased mobility, difficulty walking, decreased strength, impaired sensation, postural dysfunction, obesity, and pain.   ACTIVITY LIMITATIONS: carrying, lifting, bending, sitting, standing, squatting, stairs, transfers, and reach over head  PARTICIPATION LIMITATIONS: meal prep, cleaning, laundry, driving, shopping, and community activity  PERSONAL FACTORS: Fitness, Time since onset of injury/illness/exacerbation, and 3+ comorbidities: post covid, ICU myopathy, possible ca  are also affecting patient's functional outcome.   REHAB POTENTIAL: Good  CLINICAL DECISION MAKING: Unstable/unpredictable  EVALUATION COMPLEXITY: Moderate   GOALS: Goals reviewed with patient? Yes  SHORT TERM GOALS: Target date: 03/09/22 Pt to tolerate session of aquatic therapy with rest periods consistently for 1 month to demonstrate pt toleration to intervention. Baseline:TBA Goal status: MET 04/01/22  2.  Pt to complete 2 minute walk test without decrease in O2 sats <90%. Baseline:  Goal status: ongoing 03/09/22  3.  Pt will report improved toleration to standing  (cooking, doing dishes) up to 5 minutes without needing rest period to demonstrate improving activity toleration. Baseline: 3 mins Goal status: Ongoing -04/01/22  4.  Pt to improve in strength by at least 1/2 grade LE to improve functional mobility and ADL's Baseline: see chart above Goal status: ongoing  5.  Pt will report a decrease in knee pain no higher than 5/10 Baseline: see chart Goal status: ongoing    LONG TERM  GOALS: Target date: 04/06/22  Pt will meet stated Foto goal Baseline: see test Goal status: INITIAL  2.  Pt will walk 600 ft in 2  minutes to demonstrate improved toleration (norm) Baseline: tba Goal status: INITIAL  3.  Pt will improve on 5 X STS test to < 13s to demonstrate improved mobility and strength Baseline: 19.43 Goal status: INITIAL  4.  Pt will tolerate walking one way to pool and session without reports of excessive fatigue Baseline: Pt using wc to and from Goal status: INITIAL  5.  Pt will be indep with final HEP to continue with management of condition. Baseline: initial HEP Goal status: INITIAL     PLAN:  PT FREQUENCY: 1-2x/week  PT DURATION: 8 weeks  PLANNED INTERVENTIONS: Therapeutic exercises, Therapeutic activity, Neuromuscular re-education, Balance training, Gait training, Patient/Family education, Self Care, Joint mobilization, Stair training, DME instructions, Aquatic Therapy, Dry Needling, Electrical stimulation, Cryotherapy, Moist heat, Taping, Ultrasound, Ionotophoresis 4mg /ml Dexamethasone, Manual therapy, and Re-evaluation  PLAN FOR NEXT SESSION: Initiate aquatic therapy working walking toleration; endurance/aerobic capacity, strengthening LE and core.  Allow for multiple rest periods as needed.  Monitor O2 sats and pulse throughout.  Kerin Perna, PTA 04/01/22 10:01 AM [facility] [address] [place], [place], [address] Phone: [phone]   Fax:  [phone]

## 2022-04-04 ENCOUNTER — Encounter: Payer: Self-pay | Admitting: Oncology

## 2022-04-05 ENCOUNTER — Encounter (HOSPITAL_BASED_OUTPATIENT_CLINIC_OR_DEPARTMENT_OTHER): Payer: Self-pay | Admitting: Physical Therapy

## 2022-04-05 ENCOUNTER — Ambulatory Visit (HOSPITAL_BASED_OUTPATIENT_CLINIC_OR_DEPARTMENT_OTHER): Payer: Medicare HMO | Admitting: Physical Therapy

## 2022-04-05 ENCOUNTER — Ambulatory Visit: Payer: Medicare Other | Admitting: Physical Medicine and Rehabilitation

## 2022-04-05 DIAGNOSIS — Z7409 Other reduced mobility: Secondary | ICD-10-CM

## 2022-04-05 DIAGNOSIS — G7281 Critical illness myopathy: Secondary | ICD-10-CM | POA: Diagnosis not present

## 2022-04-05 DIAGNOSIS — R2681 Unsteadiness on feet: Secondary | ICD-10-CM

## 2022-04-05 NOTE — Therapy (Addendum)
OUTPATIENT PHYSICAL THERAPY LOWER EXTREMITY TREATMENT  PHYSICAL THERAPY DISCHARGE SUMMARY  Visits from Start of Care: 7  Current functional level related to goals / functional outcomes: Requires assistance   Remaining deficits: fatigue   Education / Equipment: Management of condition   Patient agrees to discharge. Patient goals were partially met. Patient is being discharged due to financial reasons.  Addend Corrie Dandy Tomma Lightning) Dare Sanger MPT 06/18/22 437p  Patient Name: Kaitlin [name] MRN: [id number] DOB:17-Sep-1965, 57 y.o., female Today's Date: 04/05/2022  END OF SESSION:  PT End of Session - 04/05/22 1316     Visit Number 7    Number of Visits 12    Date for PT Re-Evaluation 04/06/22    Authorization Type Medicare    Authorization Time Period 02/09/22 - 04/06/22    Progress Note Due on Visit 10    PT Start Time 1031    PT Stop Time 1115    PT Time Calculation (min) 44 min    Activity Tolerance Patient limited by fatigue    Behavior During Therapy [facility] for tasks assessed/performed              Past Medical History:  Diagnosis Date   Anemia    Bronchitis    Cataracts, bilateral 01/12/2018   CHF (congestive heart failure) (HCC)    Diabetes mellitus    High cholesterol    Hypertension    takes medicine to protect kidneys, does not have HTN   Leukocytosis    Morbid obesity (HCC) 09/11/2011   BMI 57   Neuropathy 01/12/2018   NICM (nonischemic cardiomyopathy) (HCC) 05/01/2013   Overview:  Last Assessment & Plan:  euvolemic on physical exam.    Obesity    Sickle cell trait (HCC)    Thymoma    Past Surgical History:  Procedure Laterality Date   COLONOSCOPY N/A 06/21/2017   Procedure: COLONOSCOPY;  Surgeon: Kerin Salen, MD;  Location: [facility]OS[facility];  Service: Gastroenterology;  Laterality: N/A;   LEFT HEART CATHETERIZATION WITH CORONARY ANGIOGRAM N/A 05/25/2013   Procedure: LEFT HEART CATHETERIZATION WITH CORONARY ANGIOGRAM;  Surgeon: Lennette Bihari, MD;  Location: [facility]  CATH LAB;  Service: Cardiovascular;  Laterality: N/A;   LYMPH NODE BIOPSY Right 11/30/2012   Procedure: RIGHT TONSIL BIOPSY WITH FRESH FROZEN ANALYSIS;  Surgeon: Flo Shanks, MD;  Location: [facility] OR;  Service: ENT;  Laterality: Right;   MEDIASTERNOTOMY N/A 01/12/2013   Procedure: MEDIAN STERNOTOMY;  Surgeon: Alleen Borne, MD;  Location: [facility] OR;  Service: Thoracic;  Laterality: N/A;   MEDIASTINOTOMY CHAMBERLAIN MCNEIL    Right 11/06/2012   Procedure: MEDIASTINOTOMY CHAMBERLAIN MCNEIL PROCEDURE;  Surgeon: Alleen Borne, MD;  Location: [facility] OR;  Service: Thoracic;  Laterality: Right;   POLYPECTOMY  06/21/2017   Procedure: POLYPECTOMY;  Surgeon: Kerin Salen, MD;  Location: [facility];  Service: Gastroenterology;;   RESECTION OF A THYMOMA N/A 01/12/2013   Procedure: RESECTION OF A THYMOMA;  Surgeon: Alleen Borne, MD;  Location: [facility] OR;  Service: Thoracic;  Laterality: N/A;   TONSILLECTOMY     TUBAL LIGATION     Patient Active Problem List   Diagnosis Date Noted   Long COVID 01/02/2020   Mixed stress and urge urinary incontinence 07/04/2019   Nerve pain 05/16/2019   Myofascial pain 05/16/2019   Subluxation of acromioclavicular joint, right, sequela 03/23/2019   Sickle cell trait (HCC)    Labile blood glucose    Diabetic peripheral neuropathy (HCC)    Hypokalemia    Pain in  joint of left shoulder    Debility    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   COVID-19    History of anemia    Super obese    Poorly controlled type 2 diabetes mellitus with peripheral neuropathy (HCC)    Acute on chronic diastolic (congestive) heart failure (HCC)    Hypernatremia    Shock circulatory (HCC)    Septic shock (HCC)    Hypotension 01/13/2019   Acute respiratory failure with hypoxemia (HCC) 01/13/2019   Acute respiratory distress syndrome (ARDS) due to COVID-19 virus (HCC) 01/12/2019   Grade I diastolic dysfunction 01/12/2019   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   Dyspnea on exertion 05/19/2016   Other  fatigue 05/19/2016   Absolute anemia 04/15/2016   Left leg weakness 10/18/2014   Left leg paresthesias 10/18/2014   Acute left-sided weakness 10/18/2014   Encounter for central line placement 03/08/2014   Taking drug for chronic disease 03/08/2014   H/O insertion of insulin pump 02/18/2014   Long term current use of insulin (HCC) 02/18/2014   Insulin pump in place 02/18/2014   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   Abnormal ballistocardiogram 05/18/2013   Abnormal nuclear stress test 05/18/2013   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   NICM (nonischemic cardiomyopathy) (HCC) 05/01/2013   Insulin dependent type 2 diabetes mellitus (HCC) 04/10/2013   Sex counseling 04/10/2013   Edema leg 04/10/2013   Upper respiratory infection, viral 04/10/2013   Pedal edema 03/29/2013   Pneumonia 12/12/2012   Essential hypertension 12/12/2012   HLD (hyperlipidemia) 12/12/2012   Tonsillar mass 12/01/2012   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   Leukocytosis    Anemia    High cholesterol    Hypertension    Blurred vision 09/11/2011   Foot pain 09/11/2011   Breast pain 09/11/2011   Morbid obesity (HCC) 09/11/2011   Fungal infection of nail 09/11/2011   Avitaminosis D 09/11/2011    PCP: Judd Lien,  PA-c REFERRING PROVIDER:   Genice Rouge, MD    REFERRING DIAG: G72.81 (ICD-10-CM) - Intensive care (ICU) myopathy   THERAPY DIAG:  Critical illness myopathy  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  Plaque like right lower lobe pleural based nodularity.   Pt had ICU myopathy and is still very weak  generalized- also has BMI >50- needs aquatic therapy- per her cardiologist for conditioning  PAIN:  Are you having pain? Yes: NPRS scale: current 5/10 Pain location: lower back, R knee  Pain description: ache, tingling Aggravating factors: stair climbing; walking 3 minutes Relieving factors: lying sleeping, tylenol  PRECAUTIONS: Knee and Fall  WEIGHT BEARING RESTRICTIONS: No  FALLS:  Has patient fallen in last 6 months? No  LIVING ENVIRONMENT: Lives with: lives with their family Lives in: House/apartment Stairs: Yes: Internal: 16 steps; on left going up Has following equipment at home: None  OCCUPATION: disability  PLOF: Independent  PATIENT GOALS: to build up my strength, being able to walk without needing to sit every 2-3 minutes, stand to cook or clean.   NEXT MD VISIT: 02/10/22  OBJECTIVE:   DIAGNOSTIC FINDINGS: none relevant in chart  PATIENT  SURVEYS:  FOTO to be complete next visit. 03/02/22: Primary 38%; Risk adjusted 49%; goal 51% visit 12  COGNITION: Overall cognitive status: Within functional limits for tasks assessed     SENSATION: Periphreal neuropathies mid calf to feet and hands   POSTURE: rounded shoulders, forward head, and flexed trunk    LOWER EXTREMITY ROM:  WFL  LOWER EXTREMITY MMT:  MMT Right eval Left eval  Hip flexion Seated 3+ 3  Hip extension 3 standing 3  Hip abduction 4 4  Hip adduction 4 4  Hip internal rotation    Hip external rotation    Knee flexion 4- 3+  Knee extension 4- 4-  Ankle dorsiflexion    Ankle plantarflexion    Ankle inversion    Ankle eversion     (Blank rows = not tested)  FUNCTIONAL TESTS:  5 times sit to stand: 19.43 from wc: pulse 114 O2 sats 94%   VITALS: 3/11:   during rest/exercise 95-112 bpm,  95-97% spO2 3//14:  104-124bpm, spO2 95-97% 04/01/22:   105-120 -93-97%  GAIT: Distance walked: 40 ft Assistive device utilized: None Level of assistance: SBA Comments: cadence slowed, step  length short; increased lateral displacement   TODAY'S TREATMENT:                                                                                                                              Pt seen for aquatic therapy today.  Treatment took place in water 3 ft 6" in depth at the [facility] pool. Temp of water was 91.  Pt entered/exited the pool via stairs using alternating pattern with bilat hand rail.  *Without support in 3.6 ft depth: 4 laps of each forward, backward, seated rest break *  side stepping x 4 laps -with short seated rest break after 5 total laps * holding wall: hip abdct/ addct x 5; Hip ext to toe touch x 10;  B heel raises x 10; relaxed squats x 10;   L stretch x 3 * tricep push down with yellow hand floats x 10 *seated: short noodle press x 10 * kick board row with wide stance and staggered stance x 5 each * standing TrA set with kick board press (3-5s) x 5,   * seated rest break * hip hinge with hands on yellow kick board and back 1 ft from wall x 10. * seated rest break  Pt requires the buoyancy and hydrostatic pressure of water for support, and to offload joints by unweighting joint load by at least 50 % in navel deep water and by at least 75-80% in chest to neck deep water.  Viscosity of the water is needed for resistance of strengthening. Water current perturbations provides challenge to standing balance requiring increased core activation.   PATIENT EDUCATION:  Education details: aquatic modifications;  energy conservation/management Person educated: Patient Education method: Explanation Education comprehension: verbalized understanding  HOME EXERCISE PROGRAM: [id number] from last episode.  ASSESSMENT:  CLINICAL IMPRESSION: Pt with increased toleration to amb having walked an additional 100 ft (~) to setting. She is able to teach back borg scale and reports use of it daily.  She does require rest periods throughout but puts forth good effort.  Next  session will be her last due to new co-pays. Will prepare final aquatic HEP as she has access to a [employer] for completion of program.    OBJECTIVE IMPAIRMENTS: Abnormal gait, decreased activity tolerance, decreased balance, decreased endurance, decreased knowledge of use of DME, decreased mobility, difficulty walking, decreased strength, impaired sensation, postural dysfunction, obesity, and pain.   ACTIVITY LIMITATIONS: carrying, lifting, bending, sitting, standing, squatting, stairs, transfers, and reach over head  PARTICIPATION LIMITATIONS: meal prep, cleaning, laundry, driving, shopping, and community activity  PERSONAL FACTORS: Fitness, Time since onset of injury/illness/exacerbation, and 3+ comorbidities: post covid, ICU myopathy, possible ca  are also affecting patient's functional outcome.   REHAB POTENTIAL: Good  CLINICAL DECISION MAKING: Unstable/unpredictable  EVALUATION COMPLEXITY: Moderate   GOALS: Goals reviewed with patient? Yes  SHORT TERM GOALS: Target date: 03/09/22 Pt to tolerate session of aquatic therapy with rest periods consistently for 1 month to demonstrate pt toleration to intervention. Baseline:TBA Goal status: MET 04/01/22  2.  Pt to complete 2 minute walk test without decrease in O2 sats <90%. Baseline:  Goal status: ongoing 03/09/22  3.  Pt will report improved toleration to standing  (cooking, doing dishes) up to 5 minutes without needing rest period to demonstrate improving activity toleration. Baseline: 3 mins Goal status: Ongoing -04/01/22  4.  Pt to improve in strength by at least 1/2 grade LE to improve functional mobility and ADL's Baseline: see chart above Goal status: ongoing  5.  Pt will report a decrease in knee pain no higher than 5/10 Baseline: see chart Goal status: ongoing    LONG TERM GOALS: Target date: 04/06/22  Pt will meet stated Foto goal Baseline: see test Goal status: NM pt did not return due to financial limitations  04/05/22  2.  Pt will walk 600 ft in 2  minutes to demonstrate improved toleration (norm) Baseline: tba Goal status: NM pt did not return due to financial limitations 04/05/22  3.  Pt will improve on 5 X STS test to < 13s to demonstrate improved mobility and strength Baseline: 19.43 Goal status: NM pt did not return due to financial limitations 04/05/22  4.  Pt will tolerate walking one way to pool and session without reports of excessive fatigue Baseline: Pt using wc to and from Goal status: NM pt did not return due to financial limitations 04/05/22  5.  Pt will be indep with final HEP to continue with management of condition. Baseline: initial HEP Goal status: NM pt did not return due to financial limitations 04/05/22     PLAN:  PT FREQUENCY: 1-2x/week  PT DURATION: 8 weeks  PLANNED INTERVENTIONS: Therapeutic exercises, Therapeutic activity, Neuromuscular re-education, Balance training, Gait training, Patient/Family education, Self Care, Joint mobilization, Stair training, DME instructions, Aquatic Therapy, Dry Needling, Electrical stimulation, Cryotherapy, Moist heat, Taping, Ultrasound, Ionotophoresis 4mg /ml Dexamethasone, Manual therapy, and Re-evaluation  PLAN FOR NEXT SESSION: Initiate aquatic therapy working walking toleration; endurance/aerobic capacity, strengthening LE and core.  Allow for multiple rest periods as needed.  Monitor O2 sats and pulse throughout.  Corrie Dandy Goodwin) Joelle Roswell MPT 04/05/22 1:17 PM [facility] [address] [place], [place], [address] Phone: [phone]   Fax:  [phone]

## 2022-04-06 ENCOUNTER — Encounter (HOSPITAL_BASED_OUTPATIENT_CLINIC_OR_DEPARTMENT_OTHER): Payer: Self-pay

## 2022-04-06 ENCOUNTER — Ambulatory Visit (HOSPITAL_BASED_OUTPATIENT_CLINIC_OR_DEPARTMENT_OTHER): Payer: Medicare HMO | Admitting: Physical Therapy

## 2022-05-29 ENCOUNTER — Encounter: Payer: Self-pay | Admitting: Oncology

## 2022-06-28 ENCOUNTER — Encounter: Payer: Self-pay | Admitting: Physical Medicine and Rehabilitation

## 2022-06-28 ENCOUNTER — Encounter: Payer: Medicare HMO | Attending: Physical Medicine and Rehabilitation | Admitting: Physical Medicine and Rehabilitation

## 2022-06-28 VITALS — BP 125/85 | HR 85 | Ht 67.0 in | Wt 381.0 lb

## 2022-06-28 DIAGNOSIS — D4989 Neoplasm of unspecified behavior of other specified sites: Secondary | ICD-10-CM | POA: Diagnosis present

## 2022-06-28 DIAGNOSIS — U099 Post covid-19 condition, unspecified: Secondary | ICD-10-CM | POA: Insufficient documentation

## 2022-06-28 DIAGNOSIS — M7918 Myalgia, other site: Secondary | ICD-10-CM | POA: Diagnosis present

## 2022-06-28 MED ORDER — LIDOCAINE HCL 1 % IJ SOLN
3.0000 mL | Freq: Once | INTRAMUSCULAR | Status: AC
  Administered 2022-06-28: 3 mL

## 2022-06-28 NOTE — Addendum Note (Signed)
Addended by: Silas Sacramento T on: 06/28/2022 11:55 AM   Modules accepted: Orders

## 2022-06-28 NOTE — Progress Notes (Signed)
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 Still has severe difficulty with endurance due to ICU myopathy.    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   Doing OK  Wants trP injections today.   Goes for another CT scan today- to see if nodules has grown- to determine if will get radiation or not.   Dr Lin Givens- and Dr Donne Hazel - to follow up on CT scan/possible radiation.   Pain about the same-  Usually last around 30 days give or take-   Feels like works out OK to come q6 weeks.   Doesn't have HTN, but takes BP meds to protect kidney function.   Plan:  Patient here for trigger point injections for  Consent done and on chart.  Cleaned areas with alcohol and injected using a 27 gauge 1.5 inch needle  Injected 3cc-  Using 1% Lidocaine with no EPI  Upper traps B/L  x2 Levators B:/L Posterior scalenes Middle scalenes B/L  Splenius Capitus Pectoralis Major Rhomboids BL  Infraspinatus Teres Major/minor Thoracic paraspinals Lumbar paraspinals Other injections-    There was no bleeding or complications.  Patient was advised to drink a lot of water on day after injections to flush system Will have increased soreness for 12-48 hours after injections.  Can use Lidocaine patches the day AFTER injections Can use theracane on day of injections in places didn't inject Can use heating pad 4-6 hours AFTER injections  2. We discussed a spot on her L mid back- scabbed over- but if doesn't heal-   3. Cannot get briefs anymore- because doesn't have medicaid anymore- incontinence- is doing somewhat better- usually occurs at night.  Gets up ot pee at night due to fluid pill-   4. Suggest taking fluid pill ~ 1-3 pm- on days possible to take that early- when not out and about   5. F/U in 6 weeks for Trp injections.    I spent a total of  26    minutes on total care today- >50% coordination of care- due to  10 minutes on injections- and rest discussing concern about spot on back and fluid pill.

## 2022-06-28 NOTE — Patient Instructions (Signed)
Plan:  Patient here for trigger point injections for  Consent done and on chart.  Cleaned areas with alcohol and injected using a 27 gauge 1.5 inch needle  Injected 3cc-  Using 1% Lidocaine with no EPI  Upper traps B/L  x2 Levators B:/L Posterior scalenes Middle scalenes B/L  Splenius Capitus Pectoralis Major Rhomboids BL  Infraspinatus Teres Major/minor Thoracic paraspinals Lumbar paraspinals Other injections-    There was no bleeding or complications.  Patient was advised to drink a lot of water on day after injections to flush system Will have increased soreness for 12-48 hours after injections.  Can use Lidocaine patches the day AFTER injections Can use theracane on day of injections in places didn't inject Can use heating pad 4-6 hours AFTER injections  2. We discussed a spot on her L mid back- scabbed over- but if doesn't heal-   3. Cannot get briefs anymore- because doesn't have medicaid anymore- incontinence- is doing somewhat better- usually occurs at night.  Gets up ot pee at night due to fluid pill-   4. Suggest taking fluid pill ~ 1-3 pm- on days possible to take that early- when not out and about   5. F/U in 6 weeks for Trp injections.

## 2022-07-23 ENCOUNTER — Other Ambulatory Visit: Payer: Self-pay | Admitting: Physician Assistant

## 2022-07-23 DIAGNOSIS — Z1231 Encounter for screening mammogram for malignant neoplasm of breast: Secondary | ICD-10-CM

## 2022-08-02 ENCOUNTER — Encounter: Payer: Self-pay | Admitting: Oncology

## 2022-08-09 ENCOUNTER — Encounter: Payer: Self-pay | Admitting: Physical Medicine and Rehabilitation

## 2022-08-09 ENCOUNTER — Encounter: Payer: Medicare HMO | Attending: Physical Medicine and Rehabilitation | Admitting: Physical Medicine and Rehabilitation

## 2022-08-09 VITALS — BP 137/76 | HR 88 | Ht 67.0 in | Wt 378.0 lb

## 2022-08-09 DIAGNOSIS — M7918 Myalgia, other site: Secondary | ICD-10-CM | POA: Diagnosis present

## 2022-08-09 DIAGNOSIS — M792 Neuralgia and neuritis, unspecified: Secondary | ICD-10-CM | POA: Diagnosis present

## 2022-08-09 DIAGNOSIS — U099 Post covid-19 condition, unspecified: Secondary | ICD-10-CM | POA: Diagnosis present

## 2022-08-09 MED ORDER — LIDOCAINE HCL 1 % IJ SOLN: 3.0000 mL | Freq: Once | INTRAMUSCULAR | Status: AC

## 2022-08-09 NOTE — Patient Instructions (Signed)
Plan: Let me know about radiation and how it goes. Only has 5 days planned so far.    Patient here for trigger point injections for  Consent done and on chart.  Cleaned areas with alcohol and injected using a 27 gauge 1.5 inch needle  Injected 3cc- no wastage Using 1% Lidocaine with no EPI  Upper traps B/L  Levators B/L x2 Posterior scalenes Middle scalenes- B/L  Splenius Capitus Pectoralis Major Rhomboids- B/L  Infraspinatus Teres Major/minor Thoracic paraspinals Lumbar paraspinals Other injections-    Patient's level of pain prior was  4/10 Current level of pain after injections is- feels it by time gets home  There was no bleeding or complications.  Patient was advised to drink a lot of water on day after injections to flush system Will have increased soreness for 12-48 hours after injections.  Can use Lidocaine patches the day AFTER injections Can use theracane on day of injections in places didn't inject Can use heating pad 4-6 hours AFTER injections  3. F/U in 6 weeks.

## 2022-08-09 NOTE — Progress Notes (Signed)
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 Still has severe difficulty with endurance due to ICU myopathy.    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   Start radiation on 8/19-  Cannot tell if cancerous, but it's growing.  R lung- 2 spots that are growing.  Cannot get biopsy due to location.    Wants trigger point injections today- pain is increasing again.  Last not quite 6 weeks.   Plan: Let me know about radiation and how it goes. Only has 5 days planned so far.    Patient here for trigger point injections for  Consent done and on chart.  Cleaned areas with alcohol and injected using a 27 gauge 1.5 inch needle  Injected 3cc- no wastage Using 1% Lidocaine with no EPI  Upper traps B/L  Levators B/L x2 Posterior scalenes Middle scalenes- B/L  Splenius Capitus Pectoralis Major Rhomboids- B/L  Infraspinatus Teres Major/minor Thoracic paraspinals Lumbar paraspinals Other injections-    Patient's level of pain prior was  4/10 Current level of pain after injections is- feels it by time gets home  There was no bleeding or complications.  Patient was advised to drink a lot of water on day after injections to flush system Will have increased soreness for 12-48 hours after injections.  Can use Lidocaine patches the day AFTER injections Can use theracane on day of injections in places didn't inject Can use heating pad 4-6 hours AFTER injections  3. F/U in 6 weeks.

## 2022-08-12 ENCOUNTER — Ambulatory Visit
Admission: RE | Admit: 2022-08-12 | Discharge: 2022-08-12 | Disposition: A | Discharge status: Home / Self Care | Payer: Medicare HMO | Source: Ambulatory Visit | Attending: Physician Assistant | Admitting: Physician Assistant

## 2022-08-12 DIAGNOSIS — Z1231 Encounter for screening mammogram for malignant neoplasm of breast: Secondary | ICD-10-CM

## 2022-09-19 ENCOUNTER — Other Ambulatory Visit: Payer: Self-pay | Admitting: Physical Medicine and Rehabilitation

## 2022-09-22 ENCOUNTER — Encounter: Payer: Self-pay | Admitting: Oncology

## 2022-09-27 ENCOUNTER — Ambulatory Visit: Payer: Medicare HMO | Admitting: Physical Medicine and Rehabilitation

## 2022-10-03 ENCOUNTER — Other Ambulatory Visit: Payer: Self-pay

## 2022-10-03 ENCOUNTER — Ambulatory Visit
Admission: RE | Admit: 2022-10-03 | Discharge: 2022-10-03 | Disposition: A | Discharge status: Home / Self Care | Payer: 59 | Source: Ambulatory Visit | Attending: Internal Medicine | Admitting: Internal Medicine

## 2022-10-03 VITALS — BP 122/84 | HR 95 | Temp 98.2°F | Resp 22

## 2022-10-03 DIAGNOSIS — J329 Chronic sinusitis, unspecified: Secondary | ICD-10-CM

## 2022-10-03 DIAGNOSIS — B9689 Other specified bacterial agents as the cause of diseases classified elsewhere: Secondary | ICD-10-CM

## 2022-10-03 MED ORDER — DOXYCYCLINE HYCLATE 100 MG PO CAPS
100.0000 mg | ORAL_CAPSULE | Freq: Two times a day (BID) | ORAL | 0 refills | Status: DC
Start: 2022-10-03 — End: 2023-03-21

## 2022-10-03 MED ORDER — FLUTICASONE PROPIONATE 50 MCG/ACT NA SUSP
1.0000 | Freq: Every day | NASAL | 0 refills | Status: AC
Start: 2022-10-03 — End: ?

## 2022-10-03 NOTE — ED Provider Notes (Signed)
[facility]    CSN: [id number] Arrival date & time: 10/03/22  1012      History   Chief Complaint Chief Complaint  Patient presents with   Cough    Wheezing, sinus pain, and pressure and some congestion - Entered by patient   Facial Pain   Nasal Congestion   Shortness of Breath    HPI Kaitlin [name] is a 57 y.o. female  presents for evaluation of URI symptoms for 7 days.  Patient has a complex medical history including right sided lung cancer for which she just completed radiation therapy.  She also has a past medical history of hypertension, diabetes, CHF, nonischemic cardiomyopathy.  Patient reports associated symptoms of sinus pressure/pain with postnasal drip causing cough and shortness of breath. Denies N/V/D, fevers, sore throat, ear pain, body aches.  Patient does not have a hx of asthma.  She reports sick contacts via her family members.  She does have albuterol inhaler but has not used it since her symptoms began.  Pt has taken nothing OTC for symptoms. Pt has no other concerns at this time.    Cough Associated symptoms: shortness of breath   Shortness of Breath Associated symptoms: cough     Past Medical History:  Diagnosis Date   Anemia    Bronchitis    Cataracts, bilateral 01/12/2018   CHF (congestive heart failure) (HCC)    Diabetes mellitus    High cholesterol    Hypertension    takes medicine to protect kidneys, does not have HTN   Leukocytosis    Morbid obesity (HCC) 09/11/2011   BMI 57   Neuropathy 01/12/2018   NICM (nonischemic cardiomyopathy) (HCC) 05/01/2013   Overview:  Last Assessment & Plan:  euvolemic on physical exam.    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   COVID-19    History of anemia    Super obese    Poorly controlled type 2 diabetes mellitus with peripheral neuropathy (HCC)    Acute on chronic diastolic (congestive) heart failure (HCC)    Hypernatremia    Shock circulatory (HCC)    Septic shock (HCC)    Hypotension 01/13/2019   Acute respiratory failure with hypoxemia (HCC) 01/13/2019   Acute respiratory distress syndrome (ARDS) due to COVID-19 virus (HCC) 01/12/2019   Grade I diastolic dysfunction 01/12/2019   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   Dyspnea on exertion 05/19/2016   Other fatigue 05/19/2016   Absolute anemia 04/15/2016   Left leg weakness 10/18/2014   Left leg paresthesias 10/18/2014   Acute left-sided weakness 10/18/2014   Encounter for central line placement 03/08/2014   Taking drug for chronic disease 03/08/2014   H/O insertion of insulin pump 02/18/2014   Long term current use of insulin (HCC) 02/18/2014   Insulin pump in place 02/18/2014   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   Abnormal ballistocardiogram 05/18/2013   Abnormal nuclear stress test 05/18/2013   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   NICM (nonischemic cardiomyopathy) (HCC) 05/01/2013   Insulin dependent type 2 diabetes mellitus (HCC) 04/10/2013   Sex counseling 04/10/2013   Edema leg 04/10/2013   Upper respiratory infection, viral 04/10/2013   Pedal edema 03/29/2013   Pneumonia 12/12/2012   Essential hypertension 12/12/2012   HLD (hyperlipidemia) 12/12/2012   Tonsillar mass 12/01/2012   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   Leukocytosis    Anemia    High cholesterol    Hypertension    Blurred vision 09/11/2011   Foot pain 09/11/2011   Breast pain 09/11/2011   Morbid obesity (HCC) 09/11/2011   Fungal infection of nail  09/11/2011   Avitaminosis D 09/11/2011    Past Surgical History:  Procedure Laterality Date   COLONOSCOPY N/A 06/21/2017   Procedure: COLONOSCOPY;  Surgeon: Kerin Salen, MD;  Location: [facility];  Service: Gastroenterology;  Laterality: N/A;   LEFT HEART CATHETERIZATION WITH CORONARY ANGIOGRAM N/A 05/25/2013   Procedure: LEFT HEART CATHETERIZATION WITH CORONARY ANGIOGRAM;  Surgeon: Lennette Bihari, MD;  Location: [facility] CATH LAB;  Service: Cardiovascular;  Laterality: N/A;   LYMPH NODE BIOPSY Right 11/30/2012   Procedure: RIGHT TONSIL BIOPSY WITH FRESH FROZEN ANALYSIS;  Surgeon: Flo Shanks, MD;  Location: [facility] OR;  Service: ENT;  Laterality: Right;   MEDIASTERNOTOMY N/A 01/12/2013   Procedure: MEDIAN STERNOTOMY;  Surgeon: Alleen Borne, MD;  Location: [facility] OR;  Service: Thoracic;  Laterality: N/A;   MEDIASTINOTOMY CHAMBERLAIN MCNEIL    Right 11/06/2012   Procedure: MEDIASTINOTOMY CHAMBERLAIN MCNEIL PROCEDURE;  Surgeon: Alleen Borne, MD;  Location[facility] OR;  Service: Thoracic;  Laterality: Right;   POLYPECTOMY  06/21/2017   Procedure: POLYPECTOMY;  Surgeon: Kerin Salen, MD;  Location: [facility];  Service: Gastroenterology;;   RESECTION OF A THYMOMA N/A 01/12/2013   Procedure: RESECTION OF A THYMOMA;  Surgeon: Alleen Borne, MD;  Location: [facility] OR;  Service: Thoracic;  Laterality: N/A;   TONSILLECTOMY     TUBAL LIGATION      OB History   No obstetric history on file.      Home Medications    Prior to Admission medications   Medication Sig Start Date End Date Taking? Authorizing Provider  albuterol (VENTOLIN HFA) 108 (90 Base) MCG/ACT inhaler One puff every 6 hour as needed 03/09/19  Yes Angiulli, Mcarthur Rossetti, PA-C  ascorbic acid (VITAMIN C) 500 MG tablet Take 1,500 mg by mouth daily.   Yes [provider]  aspirin EC 81 MG tablet Take 1 tablet (81 mg total) by mouth daily. 05/18/16  Yes Rollene Rotunda, MD  BENZONATATE PO Take by mouth 3 (three) times daily as needed for cough.   Yes  [provider]  calcium-vitamin D (OSCAL WITH D) 500-200 MG-UNIT tablet Take 2 tablets by mouth daily with breakfast. 03/09/19  Yes Angiulli, Mcarthur Rossetti, PA-C  carvedilol (COREG) 12.5 MG tablet Take 12.5 mg by mouth 2 (two) times daily. 10/11/21  Yes [provider]  Cholecalciferol (VITAMIN D-3) 125 MCG (5000 UT) TABS Take 5,000 Units by mouth daily. 03/09/19  Yes Angiulli, Mcarthur Rossetti, PA-C  Continuous Blood Gluc Receiver (DEXCOM G6 RECEIVER) DEVI Use as directed for continuous glucose monitoring. 04/25/20  Yes [provider]  Continuous Blood Gluc Sensor (DEXCOM G6 SENSOR) MISC Inject 1 sensor to the skin every 10 days for continuous glucose monitoring. 04/25/20  Yes [provider]  Continuous Blood Gluc Transmit (DEXCOM G6 TRANSMITTER) MISC Use as directed for continuous glucose monitoring.  Reuse transmitter for 90 days then discard and replace. 04/25/20  Yes [provider]  Cyanocobalamin 2000 MCG TBCR Take by mouth.   Yes [provider]  diclofenac (VOLTAREN) 50 MG EC tablet TAKE 1 TABLET(50 MG) BY MOUTH DAILY FOR ARTHRITIS 03/23/22  Yes Lovorn, Aundra Millet, MD  doxycycline (VIBRAMYCIN) 100 MG capsule Take 1 capsule (100 mg total) by mouth 2 (two) times daily. 10/03/22  Yes Radford Pax, NP  FEROSUL 325 (65 Fe) MG tablet TAKE 1 TABLET(325 MG) BY MOUTH DAILY WITH BREAKFAST 09/20/22  Yes Lovorn, Aundra Millet, MD  fexofenadine (ALLEGRA) 180 MG tablet Take 180 mg by mouth daily.   Yes [provider]  fluticasone (FLONASE) 50 MCG/ACT nasal spray Place 1 spray into both nostrils daily. 10/03/22  Yes Radford Pax, NP  insulin aspart (NOVOLOG) 100 UNIT/ML injection    Yes [provider]  Insulin Human (INSULIN PUMP) SOLN Inject into the skin. Novolog   Yes [provider]  ipratropium (ATROVENT) 0.06 % nasal spray    Yes [provider]  lisinopril (ZESTRIL) 5 MG tablet Take 5 mg by mouth daily.   Yes [provider]   Multiple Vitamin (MULTIVITAMIN WITH MINERALS) TABS tablet Take 1 tablet by mouth daily.   Yes [provider]  Nirmatrelvir & Ritonavir 20 x 150 MG & 10 x 100MG  TBPK  06/06/20  Yes [provider]  NOVOLOG 100 UNIT/ML injection Inject into the skin. 09/19/19  Yes [provider]  potassium chloride SA (KLOR-CON) 20 MEQ tablet Take 20 mEq by mouth 2 (two) times daily.   Yes [provider]  rosuvastatin (CRESTOR) 40 MG tablet Take 1 tablet (40 mg total) by mouth daily. 03/09/19  Yes Angiulli, Mcarthur Rossetti, PA-C  Semaglutide, 1 MG/DOSE, (OZEMPIC, 1 MG/DOSE,) 4 MG/3ML SOPN Inject 1 mg into the skin once a week. 08/21/21  Yes [provider]  torsemide (DEMADEX) 20 MG tablet Take 1 tablet (20 mg total) by mouth 2 (two) times daily. 03/09/19  Yes Angiulli, Mcarthur Rossetti, PA-C  acetaminophen (TYLENOL) 160 MG/5ML solution Take 20.3 mLs (650 mg total) by mouth every 6 (six) hours as needed for mild pain or moderate pain. 03/09/19   Angiulli, Mcarthur Rossetti, PA-C  magnesium oxide (MAG-OX) 400 (241.3 Mg) MG tablet Take 1 tablet (400 mg total) by mouth 2 (two) times daily. Patient taking differently: Take 400 mg by mouth daily. 03/09/19   Angiulli, Mcarthur Rossetti, PA-C    Family History Family History  Problem Relation Age of Onset   Diabetes Mother    Heart disease Father        No details   Diabetes Maternal Aunt    Diabetes Maternal Uncle    Diabetes Maternal Grandmother    Glaucoma Maternal Grandmother    Breast cancer Maternal Grandmother    Diabetes Maternal Grandfather    Diabetes type II Other        Family HX   Breast cancer Other     Social History Social History   Tobacco Use   Smoking status: Never    Passive exposure: Yes   Smokeless tobacco: Never  Vaping Use   Vaping status: Never Used  Substance Use Topics   Alcohol use: No   Drug use: No     Allergies   Amoxicillin-pot clavulanate and Ibuprofen   Review of Systems Review of Systems  HENT:   Positive for congestion, postnasal drip, sinus pressure and sinus pain.   Respiratory:  Positive for  cough and shortness of breath.      Physical Exam Triage Vital Signs ED Triage Vitals  Encounter Vitals Group     BP 10/03/22 1024 122/84     Systolic BP Percentile --      Diastolic BP Percentile --      Pulse Rate 10/03/22 1024 95     Resp 10/03/22 1024 (!) 22     Temp 10/03/22 1024 98.2 F (36.8 C)     Temp src --      SpO2 10/03/22 1024 92 %     Weight --      Height --      Head Circumference --      Peak Flow --      Pain Score 10/03/22 1019 0     Pain Loc --      Pain Education --      Exclude from Growth Chart --    No data found.  Updated Vital Signs BP 122/84   Pulse 95   Temp 98.2 F (36.8 C)   Resp (!) 22   LMP 06/17/2012   SpO2 92%   Visual Acuity Right Eye Distance:   Left Eye Distance:   Bilateral Distance:    Right Eye Near:   Left Eye Near:    Bilateral Near:     Physical Exam Vitals and nursing note reviewed.  Constitutional:      General: She is not in acute distress.    Appearance: She is well-developed. She is obese. She is not ill-appearing.  HENT:     Head: Normocephalic and atraumatic.     Right Ear: Tympanic membrane and ear canal normal.     Left Ear: Tympanic membrane and ear canal normal.     Nose: Congestion present.     Right Turbinates: Swollen and pale.     Left Turbinates: Swollen and pale.     Right Sinus: Maxillary sinus tenderness present.     Left Sinus: Maxillary sinus tenderness present.     Mouth/Throat:     Mouth: Mucous membranes are moist.     Pharynx: Oropharynx is clear. Uvula midline. No oropharyngeal exudate or posterior oropharyngeal erythema.     Tonsils: No tonsillar exudate or tonsillar abscesses.  Eyes:     Conjunctiva/sclera: Conjunctivae normal.     Pupils: Pupils are equal, round, and reactive to light.  Cardiovascular:     Rate and Rhythm: Normal rate and regular rhythm.     Heart sounds:  Normal heart sounds.  Pulmonary:     Effort: Pulmonary effort is normal.     Breath sounds: Normal breath sounds.     Comments: Mildly elevated at 22 likely secondary to deconditioning and current medical condition Musculoskeletal:     Cervical back: Normal range of motion and neck supple.  Lymphadenopathy:     Cervical: No cervical adenopathy.  Skin:    General: Skin is warm and dry.  Neurological:     General: No focal deficit present.     Mental Status: She is alert and oriented to person, place, and time.  Psychiatric:        Mood and Affect: Mood normal.        Behavior: Behavior normal.      UC Treatments / Results  Labs (all labs ordered are listed, but only abnormal results are displayed) Labs Reviewed - No data to display  EKG   Radiology No results found.  Procedures Procedures (including critical care time)  Medications Ordered in UC Medications - No data to display  Initial Impression / Assessment and Plan / UC Course  I have reviewed the triage vital signs and the nursing notes.  Pertinent labs & imaging results that were available during my care of the patient were reviewed by me and considered in my medical decision making (see chart for details).     Reviewed exam and symptoms with patient.  No red flags.  She is not on toxic appearance and is in no acute distress.  Start doxycycline for sinusitis.  Flonase daily.  Patient states she does not tolerate nasal rinses.  Advised PCP follow-up in 2 days for recheck.  Strict ER precautions reviewed and patient verbalized understanding. Final Clinical Impressions(s) / UC Diagnoses   Final diagnoses:  Bacterial sinusitis     Discharge Instructions      Start doxycycline twice daily for 10 days.  Flonase daily.  Lots of rest and fluids.  Please follow-up with your PCP in 2 days for recheck.  Please go to the ER for any worsening symptoms.  I hope you feel better soon!    ED Prescriptions      Medication Sig Dispense Auth. Provider   fluticasone (FLONASE) 50 MCG/ACT nasal spray Place 1 spray into both nostrils daily. 15.8 mL Radford Pax, NP   doxycycline (VIBRAMYCIN) 100 MG capsule Take 1 capsule (100 mg total) by mouth 2 (two) times daily. 20 capsule Radford Pax, NP      PDMP not reviewed this encounter.   Radford Pax, NP 10/03/22 1050

## 2022-10-03 NOTE — ED Triage Notes (Signed)
Pt reports for one week she has had a cough,runny nose,SHOB ,sinus pressure. Pt has a inhaler but has not used it for [facility].

## 2022-10-03 NOTE — Discharge Instructions (Signed)
Start doxycycline twice daily for 10 days.  Flonase daily.  Lots of rest and fluids.  Please follow-up with your PCP in 2 days for recheck.  Please go to the ER for any worsening symptoms.  I hope you feel better soon!

## 2022-10-06 ENCOUNTER — Encounter: Payer: 59 | Admitting: Physical Medicine and Rehabilitation

## 2023-01-02 ENCOUNTER — Ambulatory Visit (INDEPENDENT_AMBULATORY_CARE_PROVIDER_SITE_OTHER): Payer: 59

## 2023-01-02 ENCOUNTER — Ambulatory Visit
Admission: EM | Admit: 2023-01-02 | Discharge: 2023-01-02 | Disposition: A | Discharge status: Home / Self Care | Payer: 59 | Attending: Family Medicine | Admitting: Family Medicine

## 2023-01-02 DIAGNOSIS — J189 Pneumonia, unspecified organism: Secondary | ICD-10-CM | POA: Diagnosis not present

## 2023-01-02 DIAGNOSIS — I509 Heart failure, unspecified: Secondary | ICD-10-CM | POA: Diagnosis not present

## 2023-01-02 DIAGNOSIS — R058 Other specified cough: Secondary | ICD-10-CM | POA: Diagnosis not present

## 2023-01-02 MED ORDER — CEFDINIR 300 MG PO CAPS: 300.0000 mg | ORAL_CAPSULE | Freq: Two times a day (BID) | ORAL | 0 refills | Status: DC

## 2023-01-02 MED ORDER — ACETAMINOPHEN 325 MG PO TABS: 650.0000 mg | ORAL_TABLET | Freq: Four times a day (QID) | ORAL | 0 refills | Status: AC | PRN

## 2023-01-02 MED ORDER — AZITHROMYCIN 250 MG PO TABS: ORAL_TABLET | ORAL | 0 refills | Status: DC

## 2023-01-02 NOTE — ED Triage Notes (Signed)
Pt reports throat closing x 4 weeks; heart burn, headache, cough, nasal congestion x 2 weeks.

## 2023-01-02 NOTE — Discharge Instructions (Addendum)
Start azithromycin and cefdinir to address a right sided pneumonia. Monitor your pulse oximetry at home. If it drops below 90% and stays there, then go to the hospital. Take Zyrtec and Tylenol for your sinus headaches. We will let you know about your blood test results tomorrow. If they are severely abnormal and you are not improved, we will redirect to the hospital.

## 2023-01-02 NOTE — ED Provider Notes (Signed)
[facility]  Note:  This document was prepared using Conservation officer, historic buildings and may include unintentional dictation errors.  MRN: [id number] DOB: May 02, 1965  Subjective:   Kaitlin [name] is a 57 y.o. female presenting for 4-week history of acute onset persistent throat swelling, throat congestion, sinus headaches.  For the past 2 weeks has had more fatigue, coughing and chest congestion.  Has had occasional transient chest pains.  Has a history of pneumonia, COVID-19, bronchitis.  History of respiratory failure with hypoxemia, acute respiratory distress syndrome from having COVID-19.  Has had COVID long-hauler syndrome.  Has CHF as well but has not had a recent echocardiogram.  Last was 01/16/2019, had an EF of 45 to 50%.  No smoking of any kind including cigarettes, cigars, vaping, marijuana use.     Current Facility-Administered Medications:    lidocaine (XYLOCAINE) 1 % (with pres) injection 3 mL, 3 mL, Other, Once,   Current Outpatient Medications:    acetaminophen (TYLENOL) 160 MG/5ML solution, Take 20.3 mLs (650 mg total) by mouth every 6 (six) hours as needed for mild pain or moderate pain., Disp: 120 mL, Rfl: 0   albuterol (VENTOLIN HFA) 108 (90 Base) MCG/ACT inhaler, One puff every 6 hour as needed, Disp: 6.7 g, Rfl: 1   ascorbic acid (VITAMIN C) 500 MG tablet, Take 1,500 mg by mouth daily., Disp: , Rfl:    aspirin EC 81 MG tablet, Take 1 tablet (81 mg total) by mouth daily., Disp: 90 tablet, Rfl: 3   BENZONATATE PO, Take by mouth 3 (three) times daily as needed for cough., Disp: , Rfl:    calcium-vitamin D (OSCAL WITH D) 500-200 MG-UNIT tablet, Take 2 tablets by mouth daily with breakfast., Disp: 60 tablet, Rfl: 0   carvedilol (COREG) 12.5 MG tablet, Take 12.5 mg by mouth 2 (two) times daily., Disp: , Rfl:    Cholecalciferol (VITAMIN D-3) 125 MCG (5000 UT) TABS, Take 5,000 Units by mouth daily., Disp: 30 tablet, Rfl: 1   Continuous Blood Gluc  Receiver (DEXCOM G6 RECEIVER) DEVI, Use as directed for continuous glucose monitoring., Disp: , Rfl:    Continuous Blood Gluc Sensor (DEXCOM G6 SENSOR) MISC, Inject 1 sensor to the skin every 10 days for continuous glucose monitoring., Disp: , Rfl:    Continuous Blood Gluc Transmit (DEXCOM G6 TRANSMITTER) MISC, Use as directed for continuous glucose monitoring. Reuse transmitter for 90 days then discard and replace., Disp: , Rfl:    Cyanocobalamin 2000 MCG TBCR, Take by mouth., Disp: , Rfl:    diclofenac (VOLTAREN) 50 MG EC tablet, TAKE 1 TABLET(50 MG) BY MOUTH DAILY FOR ARTHRITIS, Disp: 90 tablet, Rfl: 3   doxycycline (VIBRAMYCIN) 100 MG capsule, Take 1 capsule (100 mg total) by mouth 2 (two) times daily., Disp: 20 capsule, Rfl: 0   FEROSUL 325 (65 Fe) MG tablet, TAKE 1 TABLET(325 MG) BY MOUTH DAILY WITH BREAKFAST, Disp: 90 tablet, Rfl: 3   fexofenadine (ALLEGRA) 180 MG tablet, Take 180 mg by mouth daily., Disp: , Rfl:    fluticasone (FLONASE) 50 MCG/ACT nasal spray, Place 1 spray into both nostrils daily., Disp: 15.8 mL, Rfl: 0   HUMALOG 100 UNIT/ML injection, Inject into the skin., Disp: , Rfl:    insulin aspart (NOVOLOG) 100 UNIT/ML injection, , Disp: , Rfl:    Insulin Human (INSULIN PUMP) SOLN, Inject into the skin. Novolog, Disp: , Rfl:    ipratropium (ATROVENT) 0.06 % nasal spray, , Disp: , Rfl:  lisinopril (ZESTRIL) 5 MG tablet, Take 5 mg by mouth daily., Disp: , Rfl:    magnesium oxide (MAG-OX) 400 (241.3 Mg) MG tablet, Take 1 tablet (400 mg total) by mouth 2 (two) times daily. (Patient taking differently: Take 400 mg by mouth daily.), Disp: 60 tablet, Rfl: 0   Multiple Vitamin (MULTIVITAMIN WITH MINERALS) TABS tablet, Take 1 tablet by mouth daily., Disp: , Rfl:    Nirmatrelvir & Ritonavir 20 x 150 MG & 10 x 100MG  TBPK, , Disp: , Rfl:    NOVOLOG 100 UNIT/ML injection, Inject into the skin., Disp: , Rfl:    potassium chloride SA (KLOR-CON) 20 MEQ tablet, Take 20 mEq by mouth 2 (two) times  daily., Disp: , Rfl:    rosuvastatin (CRESTOR) 40 MG tablet, Take 1 tablet (40 mg total) by mouth daily., Disp: 30 tablet, Rfl: 0   Semaglutide, 1 MG/DOSE, (OZEMPIC, 1 MG/DOSE,) 4 MG/3ML SOPN, Inject 1 mg into the skin once a week., Disp: , Rfl:    torsemide (DEMADEX) 20 MG tablet, Take 1 tablet (20 mg total) by mouth 2 (two) times daily., Disp: 60 tablet, Rfl: 1   Allergies  Allergen Reactions   Amoxicillin-Pot Clavulanate Diarrhea and Other (See Comments)    Did it involve swelling of the face/tongue/throat, SOB, or low BP? No  Did it involve sudden or severe rash/hives, skin peeling, or any reaction on the inside of your mouth or nose? No  Did you need to seek medical attention at a hospital or doctor's office? No  When did it last happen?    Unknown    If all above answers are "NO", may proceed with cephalosporin use.   Ibuprofen Other (See Comments)    Per MD she should no longer take, unsure why Other reaction(s): Other (See Comments), Other (See Comments) Per MD she should no longer take, unsure why Per MD she should no longer take, unsure why Per MD she should no longer take, unsure why Per MD she should no longer take, unsure why    Amoxicillin Diarrhea    Past Medical History:  Diagnosis Date   Anemia    Bronchitis    Cataracts, bilateral 01/12/2018   CHF (congestive heart failure) (HCC)    Diabetes mellitus    High cholesterol    Hypertension    takes medicine to protect kidneys, does not have HTN   Leukocytosis    Morbid obesity (HCC) 09/11/2011   BMI 57   Neuropathy 01/12/2018   NICM (nonischemic cardiomyopathy) (HCC) 05/01/2013   Overview:  Last Assessment & Plan:  euvolemic on physical exam.    Obesity    Sickle cell trait (HCC)    Thymoma      Past Surgical History:  Procedure Laterality Date   COLONOSCOPY N/A 06/21/2017   Procedure: COLONOSCOPY;  Surgeon: Kerin Salen, MD;  Location: [facility]COPY;  Service: Gastroenterology;  Laterality: N/A;   LEFT  HEART CATHETERIZATION WITH CORONARY ANGIOGRAM N/A 05/25/2013   Procedure: LEFT HEART CATHETERIZATION WITH CORONARY ANGIOGRAM;  Surgeon: Lennette Bihari, MD;  Location: [facility] CATH LAB;  Service: Cardiovascular;  Laterality: N/A;   LYMPH NODE BIOPSY Right 11/30/2012   Procedure: RIGHT TONSIL BIOPSY WITH FRESH FROZEN ANALYSIS;  Surgeon: Flo Shanks, MD;  Location: [facility] OR;  Service: ENT;  Laterality: Right;   MEDIASTERNOTOMY N/A 01/12/2013   Procedure: MEDIAN STERNOTOMY;  Surgeon: Alleen Borne, MD;  Location: [facility] OR;  Service: Thoracic;  Laterality: N/A;   MEDIASTINOTOMY CHAMBERLAIN MCNEIL  Right 11/06/2012   Procedure: MEDIASTINOTOMY CHAMBERLAIN MCNEIL PROCEDURE;  Surgeon: Alleen Borne, MD;  Location: [facility] OR;  Service: Thoracic;  Laterality: Right;   POLYPECTOMY  06/21/2017   Procedure: POLYPECTOMY;  Surgeon: Kerin Salen, MD;  Location: [facility];  Service: Gastroenterology;;   RESECTION OF A THYMOMA N/A 01/12/2013   Procedure: RESECTION OF A THYMOMA;  Surgeon: Alleen Borne, MD;  Location: [facility] OR;  Service: Thoracic;  Laterality: N/A;   TONSILLECTOMY     TUBAL LIGATION      Family History  Problem Relation Age of Onset   Diabetes Mother    Heart disease Father        No details   Diabetes Maternal Aunt    Diabetes Maternal Uncle    Diabetes Maternal Grandmother    Glaucoma Maternal Grandmother    Breast cancer Maternal Grandmother    Diabetes Maternal Grandfather    Diabetes type II Other        Family HX   Breast cancer Other     Social History   Tobacco Use   Smoking status: Never    Passive exposure: Yes   Smokeless tobacco: Never  Vaping Use   Vaping status: Never Used  Substance Use Topics   Alcohol use: No   Drug use: No    ROS   Objective:   Vitals: BP 113/77 (BP Location: Left Arm)   Pulse (!) 102   Temp 99.3 F (37.4 C) (Oral)   Resp 20   LMP 06/17/2012   SpO2 91%   Physical Exam Constitutional:      General: She is not in acute distress.     Appearance: Normal appearance. She is well-developed. She is obese. She is not ill-appearing, toxic-appearing or diaphoretic.  HENT:     Head: Normocephalic and atraumatic.     Nose: Nose normal.     Mouth/Throat:     Mouth: Mucous membranes are moist.  Eyes:     General: No scleral icterus.       Right eye: No discharge.        Left eye: No discharge.     Extraocular Movements: Extraocular movements intact.  Cardiovascular:     Rate and Rhythm: Normal rate and regular rhythm.     Heart sounds: Normal heart sounds. No murmur heard.    No friction rub. No gallop.  Pulmonary:     Effort: No respiratory distress.     Breath sounds: No stridor. No wheezing, rhonchi or rales.     Comments: Decreased lung sounds throughout which may be related to body habitus. Chest:     Chest wall: No tenderness.  Skin:    General: Skin is warm and dry.  Neurological:     General: No focal deficit present.     Mental Status: She is alert and oriented to person, place, and time.  Psychiatric:        Mood and Affect: Mood normal.        Behavior: Behavior normal.     DG Chest 2 View Result Date: 01/02/2023 CLINICAL DATA:  Cough, hypoxia EXAM: CHEST - 2 VIEW COMPARISON:  Chest radiograph dated 10/19/2021. FINDINGS: The heart size and mediastinal contours are within normal limits. Mild airspace opacities in the right lower lung. A trace right pleural effusion may contribute. The left lung is clear. No pneumothorax. The visualized skeletal structures are unremarkable. IMPRESSION: Mild airspace opacities in the right lower lung may represent pneumonia. A trace right pleural effusion  may contribute. Electronically Signed   By: Romona Curls M.D.   On: 01/02/2023 13:27     Assessment and Plan :   PDMP not reviewed this encounter.  1. Pneumonia of right lower lobe due to infectious organism   2. Productive cough   3. Congestive heart failure, unspecified HF chronicity, unspecified heart failure type  [facility])    Patient has significant risk factors but ultimately we agreed to manage for the next 24 hours with strict ER precautions.  Start cefdinir, azithromycin.  Monitor pulse oximetry at home strictly.  If patient develops chest pain, shortness of breath, hypoxia I advised that she present to the emergency room via EMS.  Basic labs pursued.  Should she have severe leukocytosis, recommend evaluation through the emergency room especially if she continues to have symptoms.  Counseled patient on potential for adverse effects with medications prescribed/recommended today, ER and return-to-clinic precautions discussed, patient verbalized understanding.    Wallis Bamberg, PA-C 01/02/23 1416

## 2023-01-03 ENCOUNTER — Telehealth: Payer: Self-pay

## 2023-01-03 LAB — CBC WITH DIFFERENTIAL/PLATELET
Basophils Absolute: 0 10*3/uL (ref 0.0–0.2)
Basos: 0 %
EOS (ABSOLUTE): 0 10*3/uL (ref 0.0–0.4)
Eos: 0 %
Hematocrit: 45 % (ref 34.0–46.6)
Hemoglobin: 14.4 g/dL (ref 11.1–15.9)
Immature Grans (Abs): 0 10*3/uL (ref 0.0–0.1)
Immature Granulocytes: 0 %
Lymphocytes Absolute: 1.3 10*3/uL (ref 0.7–3.1)
Lymphs: 17 %
MCH: 27.3 pg (ref 26.6–33.0)
MCHC: 32 g/dL (ref 31.5–35.7)
MCV: 85 fL (ref 79–97)
Monocytes Absolute: 0.5 10*3/uL (ref 0.1–0.9)
Monocytes: 7 %
Neutrophils Absolute: 5.8 10*3/uL (ref 1.4–7.0)
Neutrophils: 76 %
Platelets: 236 10*3/uL (ref 150–450)
RBC: 5.28 x10E6/uL (ref 3.77–5.28)
RDW: 15.7 % — ABNORMAL HIGH (ref 11.7–15.4)
WBC: 7.7 10*3/uL (ref 3.4–10.8)

## 2023-01-03 LAB — BASIC METABOLIC PANEL
BUN/Creatinine Ratio: 10 (ref 9–23)
BUN: 10 mg/dL (ref 6–24)
CO2: 26 mmol/L (ref 20–29)
Calcium: 9.2 mg/dL (ref 8.7–10.2)
Chloride: 106 mmol/L (ref 96–106)
Creatinine, Ser: 0.98 mg/dL (ref 0.57–1.00)
Glucose: 182 mg/dL — ABNORMAL HIGH (ref 70–99)
Potassium: 4.1 mmol/L (ref 3.5–5.2)
Sodium: 143 mmol/L (ref 134–144)
eGFR: 68 mL/min/{1.73_m2} (ref >59)

## 2023-01-03 NOTE — Telephone Encounter (Signed)
Pt calls this urgent care to ask about her blood work that was drawn yesterday. Pt is informed that at this time, her labs are still in process. She asks if she needs to be admitted to the emergency department. Pt is informed to manage her symptoms at home for now and if she develops chest pain, shortness of breath, or hypoxia, she should go to the emergency department. She is informed that we will call her with any abnormal lab results. Pt verbalized understanding.

## 2023-03-16 ENCOUNTER — Other Ambulatory Visit: Payer: Self-pay | Admitting: Physical Medicine and Rehabilitation

## 2023-03-21 ENCOUNTER — Inpatient Hospital Stay (HOSPITAL_BASED_OUTPATIENT_CLINIC_OR_DEPARTMENT_OTHER)
Admission: EM | Admit: 2023-03-21 | Discharge: 2023-03-25 | DRG: 871 | Disposition: A | Discharge status: Home / Self Care | Attending: Internal Medicine | Admitting: Internal Medicine

## 2023-03-21 ENCOUNTER — Emergency Department (HOSPITAL_BASED_OUTPATIENT_CLINIC_OR_DEPARTMENT_OTHER)

## 2023-03-21 ENCOUNTER — Encounter (HOSPITAL_COMMUNITY): Payer: Self-pay | Admitting: Family Medicine

## 2023-03-21 ENCOUNTER — Other Ambulatory Visit: Payer: Self-pay

## 2023-03-21 DIAGNOSIS — I2489 Other forms of acute ischemic heart disease: Secondary | ICD-10-CM | POA: Diagnosis present

## 2023-03-21 DIAGNOSIS — Z5986 Financial insecurity: Secondary | ICD-10-CM

## 2023-03-21 DIAGNOSIS — C782 Secondary malignant neoplasm of pleura: Secondary | ICD-10-CM | POA: Diagnosis present

## 2023-03-21 DIAGNOSIS — I2510 Atherosclerotic heart disease of native coronary artery without angina pectoris: Secondary | ICD-10-CM | POA: Diagnosis present

## 2023-03-21 DIAGNOSIS — Z803 Family history of malignant neoplasm of breast: Secondary | ICD-10-CM

## 2023-03-21 DIAGNOSIS — Z83511 Family history of glaucoma: Secondary | ICD-10-CM

## 2023-03-21 DIAGNOSIS — Z880 Allergy status to penicillin: Secondary | ICD-10-CM

## 2023-03-21 DIAGNOSIS — D573 Sickle-cell trait: Secondary | ICD-10-CM | POA: Diagnosis present

## 2023-03-21 DIAGNOSIS — A419 Sepsis, unspecified organism: Principal | ICD-10-CM | POA: Diagnosis present

## 2023-03-21 DIAGNOSIS — J111 Influenza due to unidentified influenza virus with other respiratory manifestations: Secondary | ICD-10-CM | POA: Diagnosis present

## 2023-03-21 DIAGNOSIS — Z923 Personal history of irradiation: Secondary | ICD-10-CM

## 2023-03-21 DIAGNOSIS — Z794 Long term (current) use of insulin: Secondary | ICD-10-CM | POA: Diagnosis not present

## 2023-03-21 DIAGNOSIS — I700 Atherosclerosis of aorta: Secondary | ICD-10-CM | POA: Diagnosis present

## 2023-03-21 DIAGNOSIS — J159 Unspecified bacterial pneumonia: Secondary | ICD-10-CM | POA: Diagnosis present

## 2023-03-21 DIAGNOSIS — E875 Hyperkalemia: Secondary | ICD-10-CM | POA: Diagnosis present

## 2023-03-21 DIAGNOSIS — E7800 Pure hypercholesterolemia, unspecified: Secondary | ICD-10-CM | POA: Diagnosis present

## 2023-03-21 DIAGNOSIS — Z79899 Other long term (current) drug therapy: Secondary | ICD-10-CM

## 2023-03-21 DIAGNOSIS — E1140 Type 2 diabetes mellitus with diabetic neuropathy, unspecified: Secondary | ICD-10-CM | POA: Diagnosis present

## 2023-03-21 DIAGNOSIS — I5043 Acute on chronic combined systolic (congestive) and diastolic (congestive) heart failure: Secondary | ICD-10-CM | POA: Diagnosis present

## 2023-03-21 DIAGNOSIS — Z8249 Family history of ischemic heart disease and other diseases of the circulatory system: Secondary | ICD-10-CM | POA: Diagnosis not present

## 2023-03-21 DIAGNOSIS — G4733 Obstructive sleep apnea (adult) (pediatric): Secondary | ICD-10-CM | POA: Diagnosis present

## 2023-03-21 DIAGNOSIS — Z860100 Personal history of colon polyps, unspecified: Secondary | ICD-10-CM

## 2023-03-21 DIAGNOSIS — I420 Dilated cardiomyopathy: Secondary | ICD-10-CM | POA: Diagnosis present

## 2023-03-21 DIAGNOSIS — Z1152 Encounter for screening for COVID-19: Secondary | ICD-10-CM

## 2023-03-21 DIAGNOSIS — J1008 Influenza due to other identified influenza virus with other specified pneumonia: Secondary | ICD-10-CM | POA: Diagnosis present

## 2023-03-21 DIAGNOSIS — Z886 Allergy status to analgesic agent status: Secondary | ICD-10-CM

## 2023-03-21 DIAGNOSIS — M1990 Unspecified osteoarthritis, unspecified site: Secondary | ICD-10-CM | POA: Diagnosis present

## 2023-03-21 DIAGNOSIS — G9341 Metabolic encephalopathy: Secondary | ICD-10-CM | POA: Diagnosis not present

## 2023-03-21 DIAGNOSIS — Z7982 Long term (current) use of aspirin: Secondary | ICD-10-CM

## 2023-03-21 DIAGNOSIS — Z6841 Body Mass Index (BMI) 40.0 and over, adult: Secondary | ICD-10-CM

## 2023-03-21 DIAGNOSIS — J9601 Acute respiratory failure with hypoxia: Secondary | ICD-10-CM | POA: Diagnosis present

## 2023-03-21 DIAGNOSIS — I110 Hypertensive heart disease with heart failure: Secondary | ICD-10-CM | POA: Diagnosis present

## 2023-03-21 DIAGNOSIS — E66813 Obesity, class 3: Secondary | ICD-10-CM | POA: Diagnosis present

## 2023-03-21 DIAGNOSIS — J101 Influenza due to other identified influenza virus with other respiratory manifestations: Secondary | ICD-10-CM

## 2023-03-21 DIAGNOSIS — R008 Other abnormalities of heart beat: Secondary | ICD-10-CM | POA: Diagnosis not present

## 2023-03-21 DIAGNOSIS — L989 Disorder of the skin and subcutaneous tissue, unspecified: Secondary | ICD-10-CM | POA: Diagnosis present

## 2023-03-21 DIAGNOSIS — Z85238 Personal history of other malignant neoplasm of thymus: Secondary | ICD-10-CM

## 2023-03-21 DIAGNOSIS — N179 Acute kidney failure, unspecified: Secondary | ICD-10-CM | POA: Diagnosis present

## 2023-03-21 DIAGNOSIS — Z560 Unemployment, unspecified: Secondary | ICD-10-CM

## 2023-03-21 DIAGNOSIS — Z833 Family history of diabetes mellitus: Secondary | ICD-10-CM

## 2023-03-21 DIAGNOSIS — Z9641 Presence of insulin pump (external) (internal): Secondary | ICD-10-CM | POA: Diagnosis present

## 2023-03-21 LAB — CBC
HCT: 43.4 % (ref 36.0–46.0)
Hemoglobin: 13.6 g/dL (ref 12.0–15.0)
MCH: 26 pg (ref 26.0–34.0)
MCHC: 31.3 g/dL (ref 30.0–36.0)
MCV: 83 fL (ref 80.0–100.0)
Platelets: 229 10*3/uL (ref 150–400)
RBC: 5.23 MIL/uL — ABNORMAL HIGH (ref 3.87–5.11)
RDW: 15.8 % — ABNORMAL HIGH (ref 11.5–15.5)
WBC: 8.4 10*3/uL (ref 4.0–10.5)
nRBC: 0 % (ref 0.0–0.2)

## 2023-03-21 LAB — CBC WITH DIFFERENTIAL/PLATELET
Abs Immature Granulocytes: 0.03 10*3/uL (ref 0.00–0.07)
Basophils Absolute: 0 10*3/uL (ref 0.0–0.1)
Basophils Relative: 0 %
Eosinophils Absolute: 0 10*3/uL (ref 0.0–0.5)
Eosinophils Relative: 0 %
HCT: 40.5 % (ref 36.0–46.0)
Hemoglobin: 13.7 g/dL (ref 12.0–15.0)
Immature Granulocytes: 0 %
Lymphocytes Relative: 7 %
Lymphs Abs: 0.5 10*3/uL — ABNORMAL LOW (ref 0.7–4.0)
MCH: 26.3 pg (ref 26.0–34.0)
MCHC: 33.8 g/dL (ref 30.0–36.0)
MCV: 77.9 fL — ABNORMAL LOW (ref 80.0–100.0)
Monocytes Absolute: 0.6 10*3/uL (ref 0.1–1.0)
Monocytes Relative: 8 %
Neutro Abs: 6.8 10*3/uL (ref 1.7–7.7)
Neutrophils Relative %: 85 %
Platelets: 238 10*3/uL (ref 150–400)
RBC: 5.2 MIL/uL — ABNORMAL HIGH (ref 3.87–5.11)
RDW: 15.6 % — ABNORMAL HIGH (ref 11.5–15.5)
WBC: 8 10*3/uL (ref 4.0–10.5)
nRBC: 0 % (ref 0.0–0.2)

## 2023-03-21 LAB — HEMOGLOBIN A1C
Hgb A1c MFr Bld: 6.5 % — ABNORMAL HIGH (ref 4.8–5.6)
Mean Plasma Glucose: 139.85 mg/dL

## 2023-03-21 LAB — COMPREHENSIVE METABOLIC PANEL
ALT: 25 U/L (ref 0–44)
AST: 28 U/L (ref 15–41)
Albumin: 2.9 g/dL — ABNORMAL LOW (ref 3.5–5.0)
Alkaline Phosphatase: 81 U/L (ref 38–126)
Anion gap: 10 (ref 5–15)
BUN: 18 mg/dL (ref 6–20)
CO2: 25 mmol/L (ref 22–32)
Calcium: 8.8 mg/dL — ABNORMAL LOW (ref 8.9–10.3)
Chloride: 102 mmol/L (ref 98–111)
Creatinine, Ser: 0.98 mg/dL (ref 0.44–1.00)
GFR, Estimated: >60 mL/min (ref >60)
Glucose, Bld: 161 mg/dL — ABNORMAL HIGH (ref 70–99)
Potassium: 4 mmol/L (ref 3.5–5.1)
Sodium: 137 mmol/L (ref 135–145)
Total Bilirubin: 0.5 mg/dL (ref 0.0–1.2)
Total Protein: 6.7 g/dL (ref 6.5–8.1)

## 2023-03-21 LAB — HIV ANTIBODY (ROUTINE TESTING W REFLEX): HIV Screen 4th Generation wRfx: NONREACTIVE

## 2023-03-21 LAB — GLUCOSE, CAPILLARY
Glucose-Capillary: 150 mg/dL — ABNORMAL HIGH (ref 70–99)
Glucose-Capillary: 215 mg/dL — ABNORMAL HIGH (ref 70–99)

## 2023-03-21 LAB — TROPONIN I (HIGH SENSITIVITY)
Troponin I (High Sensitivity): 22 ng/L — ABNORMAL HIGH (ref <18)
Troponin I (High Sensitivity): 20 ng/L — ABNORMAL HIGH (ref <18)

## 2023-03-21 LAB — RESP PANEL BY RT-PCR (RSV, FLU A&B, COVID)  RVPGX2
Influenza A by PCR: POSITIVE — AB
Influenza B by PCR: NEGATIVE
Resp Syncytial Virus by PCR: NEGATIVE
SARS Coronavirus 2 by RT PCR: NEGATIVE

## 2023-03-21 LAB — BRAIN NATRIURETIC PEPTIDE: B Natriuretic Peptide: 94 pg/mL (ref 0.0–100.0)

## 2023-03-21 LAB — CREATININE, SERUM
Creatinine, Ser: 0.81 mg/dL (ref 0.44–1.00)
GFR, Estimated: >60 mL/min (ref >60)

## 2023-03-21 MED ORDER — METOPROLOL TARTRATE 5 MG/5ML IV SOLN: 5.0000 mg | INTRAVENOUS | Status: DC | PRN

## 2023-03-21 MED ORDER — IOHEXOL 350 MG/ML SOLN
75.0000 mL | Freq: Once | INTRAVENOUS | Status: AC | PRN
  Administered 2023-03-21: 75 mL via INTRAVENOUS

## 2023-03-21 MED ORDER — ACETAMINOPHEN 650 MG RE SUPP: 650.0000 mg | Freq: Four times a day (QID) | RECTAL | Status: DC | PRN

## 2023-03-21 MED ORDER — INSULIN ASPART 100 UNIT/ML IJ SOLN
0.0000 [IU] | Freq: Every day | INTRAMUSCULAR | Status: DC
Start: 2023-03-21 — End: 2023-03-21

## 2023-03-21 MED ORDER — ONDANSETRON HCL 4 MG/2ML IJ SOLN
4.0000 mg | Freq: Once | INTRAMUSCULAR | Status: AC
  Administered 2023-03-21: 4 mg via INTRAVENOUS
  Filled 2023-03-21: qty 2

## 2023-03-21 MED ORDER — OYSTER SHELL CALCIUM/D3 500-5 MG-MCG PO TABS
2.0000 | ORAL_TABLET | Freq: Every day | ORAL | Status: DC
  Administered 2023-03-22 – 2023-03-25 (×4): 2 via ORAL
  Filled 2023-03-21 (×4): qty 2

## 2023-03-21 MED ORDER — LISINOPRIL 5 MG PO TABS
5.0000 mg | ORAL_TABLET | Freq: Every day | ORAL | Status: DC
Start: 2023-03-21 — End: 2023-03-22
  Administered 2023-03-21: 5 mg via ORAL
  Filled 2023-03-21: qty 1

## 2023-03-21 MED ORDER — TORSEMIDE 20 MG PO TABS: 20.0000 mg | ORAL_TABLET | ORAL | Status: DC

## 2023-03-21 MED ORDER — ACETAMINOPHEN 500 MG PO TABS
1000.0000 mg | ORAL_TABLET | Freq: Once | ORAL | Status: AC
  Administered 2023-03-21: 1000 mg via ORAL
  Filled 2023-03-21: qty 2

## 2023-03-21 MED ORDER — CARVEDILOL 12.5 MG PO TABS
12.5000 mg | ORAL_TABLET | Freq: Two times a day (BID) | ORAL | Status: DC
  Administered 2023-03-21: 12.5 mg via ORAL
  Filled 2023-03-21: qty 1

## 2023-03-21 MED ORDER — CALCIUM CARBONATE-VITAMIN D 500-200 MG-UNIT PO TABS: 2.0000 | ORAL_TABLET | Freq: Every day | ORAL | Status: DC

## 2023-03-21 MED ORDER — TORSEMIDE 20 MG PO TABS
20.0000 mg | ORAL_TABLET | Freq: Two times a day (BID) | ORAL | Status: DC
  Filled 2023-03-21 (×2): qty 1

## 2023-03-21 MED ORDER — MAGNESIUM OXIDE -MG SUPPLEMENT 400 (240 MG) MG PO TABS
400.0000 mg | ORAL_TABLET | Freq: Every day | ORAL | Status: DC
  Administered 2023-03-22 – 2023-03-23 (×2): 400 mg via ORAL
  Filled 2023-03-21 (×2): qty 1

## 2023-03-21 MED ORDER — POTASSIUM CHLORIDE CRYS ER 20 MEQ PO TBCR
20.0000 meq | EXTENDED_RELEASE_TABLET | Freq: Two times a day (BID) | ORAL | Status: DC
  Administered 2023-03-21: 20 meq via ORAL
  Filled 2023-03-21: qty 1

## 2023-03-21 MED ORDER — ALBUTEROL SULFATE (2.5 MG/3ML) 0.083% IN NEBU: 2.5000 mg | INHALATION_SOLUTION | RESPIRATORY_TRACT | Status: DC | PRN

## 2023-03-21 MED ORDER — TORSEMIDE 20 MG PO TABS
20.0000 mg | ORAL_TABLET | Freq: Every day | ORAL | Status: DC
  Administered 2023-03-21: 20 mg via ORAL
  Filled 2023-03-21 (×3): qty 1

## 2023-03-21 MED ORDER — LORATADINE 10 MG PO TABS
10.0000 mg | ORAL_TABLET | Freq: Every day | ORAL | Status: DC
  Administered 2023-03-21 – 2023-03-25 (×5): 10 mg via ORAL
  Filled 2023-03-21 (×5): qty 1

## 2023-03-21 MED ORDER — FERROUS SULFATE 325 (65 FE) MG PO TABS
325.0000 mg | ORAL_TABLET | ORAL | Status: DC
  Administered 2023-03-22 – 2023-03-24 (×2): 325 mg via ORAL
  Filled 2023-03-21 (×3): qty 1

## 2023-03-21 MED ORDER — VITAMIN D 25 MCG (1000 UNIT) PO TABS
5000.0000 [IU] | ORAL_TABLET | Freq: Every day | ORAL | Status: DC
  Administered 2023-03-22 – 2023-03-25 (×4): 5000 [IU] via ORAL
  Filled 2023-03-21 (×4): qty 5

## 2023-03-21 MED ORDER — OSELTAMIVIR PHOSPHATE 75 MG PO CAPS
75.0000 mg | ORAL_CAPSULE | Freq: Two times a day (BID) | ORAL | Status: DC
Start: 2023-03-21 — End: 2023-03-26
  Administered 2023-03-21 – 2023-03-25 (×9): 75 mg via ORAL
  Filled 2023-03-21 (×9): qty 1

## 2023-03-21 MED ORDER — SODIUM CHLORIDE 0.9 % IV SOLN
500.0000 mg | INTRAVENOUS | Status: DC
  Administered 2023-03-22 – 2023-03-23 (×2): 500 mg via INTRAVENOUS
  Filled 2023-03-21 (×2): qty 5

## 2023-03-21 MED ORDER — ACETAMINOPHEN 325 MG PO TABS
650.0000 mg | ORAL_TABLET | Freq: Four times a day (QID) | ORAL | Status: DC | PRN
  Administered 2023-03-22 – 2023-03-25 (×9): 650 mg via ORAL
  Filled 2023-03-21 (×9): qty 2

## 2023-03-21 MED ORDER — FLUTICASONE PROPIONATE 50 MCG/ACT NA SUSP
1.0000 | Freq: Every day | NASAL | Status: DC
  Administered 2023-03-22 – 2023-03-25 (×4): 1 via NASAL
  Filled 2023-03-21: qty 16

## 2023-03-21 MED ORDER — HYDROCODONE-ACETAMINOPHEN 5-325 MG PO TABS
1.0000 | ORAL_TABLET | Freq: Four times a day (QID) | ORAL | Status: DC | PRN
  Administered 2023-03-22: 1 via ORAL
  Filled 2023-03-21: qty 1

## 2023-03-21 MED ORDER — INSULIN ASPART 100 UNIT/ML IJ SOLN: 0.0000 [IU] | Freq: Three times a day (TID) | INTRAMUSCULAR | Status: DC

## 2023-03-21 MED ORDER — SODIUM CHLORIDE 0.9 % IV SOLN
2.0000 g | INTRAVENOUS | Status: DC
  Administered 2023-03-22 – 2023-03-23 (×2): 2 g via INTRAVENOUS
  Filled 2023-03-21 (×2): qty 20

## 2023-03-21 MED ORDER — MAGNESIUM OXIDE 400 (241.3 MG) MG PO TABS: 400.0000 mg | ORAL_TABLET | Freq: Every day | ORAL | Status: DC

## 2023-03-21 MED ORDER — ENOXAPARIN SODIUM 80 MG/0.8ML IJ SOSY
80.0000 mg | PREFILLED_SYRINGE | Freq: Every day | INTRAMUSCULAR | Status: DC
  Administered 2023-03-21 – 2023-03-24 (×4): 80 mg via SUBCUTANEOUS
  Filled 2023-03-21 (×4): qty 0.8

## 2023-03-21 MED ORDER — INSULIN PUMP
Freq: Three times a day (TID) | SUBCUTANEOUS | Status: DC
  Administered 2023-03-22: 1.8 via SUBCUTANEOUS
  Administered 2023-03-22: 0.65 via SUBCUTANEOUS
  Filled 2023-03-21: qty 1

## 2023-03-21 MED ORDER — VITAMIN C 500 MG PO TABS
1500.0000 mg | ORAL_TABLET | Freq: Every day | ORAL | Status: DC
  Administered 2023-03-22 – 2023-03-25 (×4): 1500 mg via ORAL
  Filled 2023-03-21 (×4): qty 3

## 2023-03-21 MED ORDER — SODIUM CHLORIDE 0.9 % IV SOLN
500.0000 mg | Freq: Once | INTRAVENOUS | Status: AC
  Administered 2023-03-21: 500 mg via INTRAVENOUS
  Filled 2023-03-21: qty 5

## 2023-03-21 MED ORDER — ASPIRIN 81 MG PO TBEC
81.0000 mg | DELAYED_RELEASE_TABLET | Freq: Every day | ORAL | Status: DC
  Administered 2023-03-21 – 2023-03-25 (×5): 81 mg via ORAL
  Filled 2023-03-21 (×5): qty 1

## 2023-03-21 MED ORDER — POLYETHYLENE GLYCOL 3350 17 G PO PACK
17.0000 g | PACK | Freq: Every day | ORAL | Status: DC | PRN
  Administered 2023-03-23: 17 g via ORAL
  Filled 2023-03-21: qty 1

## 2023-03-21 MED ORDER — GUAIFENESIN 100 MG/5ML PO LIQD
5.0000 mL | ORAL | Status: DC | PRN
  Administered 2023-03-21 – 2023-03-25 (×9): 5 mL via ORAL
  Filled 2023-03-21 (×9): qty 10

## 2023-03-21 MED ORDER — VITAMIN D-3 125 MCG (5000 UT) PO TABS: 5000.0000 [IU] | ORAL_TABLET | Freq: Every day | ORAL | Status: DC

## 2023-03-21 MED ORDER — ROSUVASTATIN CALCIUM 20 MG PO TABS
40.0000 mg | ORAL_TABLET | Freq: Every day | ORAL | Status: DC
  Administered 2023-03-21 – 2023-03-25 (×5): 40 mg via ORAL
  Filled 2023-03-21 (×5): qty 2

## 2023-03-21 MED ORDER — SODIUM CHLORIDE 0.9 % IV SOLN
1.0000 g | Freq: Once | INTRAVENOUS | Status: AC
  Administered 2023-03-21: 1 g via INTRAVENOUS
  Filled 2023-03-21: qty 10

## 2023-03-21 MED ORDER — IPRATROPIUM-ALBUTEROL 0.5-2.5 (3) MG/3ML IN SOLN
3.0000 mL | Freq: Once | RESPIRATORY_TRACT | Status: AC
  Administered 2023-03-21: 3 mL via RESPIRATORY_TRACT
  Filled 2023-03-21: qty 3

## 2023-03-21 NOTE — ED Notes (Signed)
 Spoke w [facility] pharmacist. Pharmacist stated pt would need to wait for transfer to receive torsemide.

## 2023-03-21 NOTE — ED Notes (Signed)
 Patient transported to CT

## 2023-03-21 NOTE — ED Notes (Signed)
 Pt became short of breath and had chest pain upon getting up to use bedside commode. Pt assisted back to bed and RN notified.

## 2023-03-21 NOTE — ED Provider Notes (Signed)
 Emergency Department Provider Note   I have reviewed the triage vital signs and the nursing notes.   HISTORY  Chief Complaint Shortness of Breath   HPI Kaitlin [name] is a 58 y.o. female with PMH of CHF, lung CA, HTN, and HLD presents to the ED with worsening SOB symptoms.  Symptoms have been ongoing for the past 4 to 5 days.  Earlier today she experienced some tightness in her chest which has resolved.  Denies pleuritic pain.  She went to urgent care PTA and was found to be febrile and SOB.  Her chest x-ray was abnormal and she was advised to present to the emergency department for further evaluation.   Past Medical History:  Diagnosis Date   Anemia    Bronchitis    Cataracts, bilateral 01/12/2018   CHF (congestive heart failure) (HCC)    Diabetes mellitus    High cholesterol    Hypertension    takes medicine to protect kidneys, does not have HTN   Leukocytosis    Morbid obesity (HCC) 09/11/2011   BMI 57   Neuropathy 01/12/2018   NICM (nonischemic cardiomyopathy) (HCC) 05/01/2013   Overview:  Last Assessment & Plan:  euvolemic on physical exam.    Obesity    Sickle cell trait (HCC)    Thymoma     Review of Systems  Constitutional: No fever/chills Cardiovascular: Positive chest pain. Respiratory: Positive shortness of breath. Gastrointestinal: No abdominal pain.  No nausea, no vomiting.  Skin: Negative for rash. Neurological: Negative for headaches. ____________________________________________   PHYSICAL EXAM:  VITAL SIGNS: ED Triage Vitals  Encounter Vitals Group     BP 03/21/23 0411 (!) 144/84     Pulse Rate 03/21/23 0411 (!) 109     Resp 03/21/23 0411 (!) 24     Temp 03/21/23 0411 100.2 F (37.9 C)     Temp Source 03/21/23 0411 Oral     SpO2 03/21/23 0411 91 %     Weight 03/21/23 0413 (!) 375 lb (170.1 kg)     Height 03/21/23 0413 5\' 8"  (1.727 m)   Constitutional: Alert and oriented. Increased SOB symptoms. Patient speaking in 3-4 word sentences  with breaks to breathe.  Eyes: Conjunctivae are normal.  Head: Atraumatic. Nose: No congestion/rhinnorhea. Mouth/Throat: Mucous membranes are moist.  Neck: No stridor.   Cardiovascular: Tachycardia. Good peripheral circulation. Grossly normal heart sounds.   Respiratory: Increased respiratory effort.  No retractions. Lungs diminished at the bases. Gastrointestinal: Soft and nontender. No distention.  Musculoskeletal: No lower extremity tenderness nor edema. No gross deformities of extremities. Neurologic:  Normal speech and language.  Skin:  Skin is warm, dry and intact. No rash noted.  ____________________________________________   LABS (all labs ordered are listed, but only abnormal results are displayed)  Labs Reviewed  RESP PANEL BY RT-PCR (RSV, FLU A&B, COVID)  RVPGX2 - Abnormal; Notable for the following components:      Result Value   Influenza A by PCR POSITIVE (*)    All other components within normal limits  COMPREHENSIVE METABOLIC PANEL - Abnormal; Notable for the following components:   Glucose, Bld 161 (*)    Calcium 8.8 (*)    Albumin 2.9 (*)    All other components within normal limits  CBC WITH DIFFERENTIAL/PLATELET - Abnormal; Notable for the following components:   RBC 5.20 (*)    MCV 77.9 (*)    RDW 15.6 (*)    Lymphs Abs 0.5 (*)    All other components  within normal limits  TROPONIN I (HIGH SENSITIVITY) - Abnormal; Notable for the following components:   Troponin I (High Sensitivity) 22 (*)    All other components within normal limits  BRAIN NATRIURETIC PEPTIDE  HEMOGLOBIN A1C  TROPONIN I (HIGH SENSITIVITY)   ____________________________________________  EKG   EKG Interpretation Date/Time:  Monday March 21 2023 04:17:44 EDT Ventricular Rate:  111 PR Interval:  138 QRS Duration:  113 QT Interval:  343 QTC Calculation: 467 R Axis:   -44  Text Interpretation: Sinus tachycardia Borderline IVCD with LAD Low voltage, precordial leads Confirmed by  Alona Bene [phone]) on 03/21/2023 4:28:27 AM        ____________________________________________  RADIOLOGY  CT Angio Chest PE W and/or Wo Contrast Result Date: 03/21/2023 CLINICAL DATA:  Evaluate for pulmonary embolism. Complains of cough, shortness of breath, nasal congestion. EXAM: CT ANGIOGRAPHY CHEST WITH CONTRAST TECHNIQUE: Multidetector CT imaging of the chest was performed using the standard protocol during bolus administration of intravenous contrast. Multiplanar CT image reconstructions and MIPs were obtained to evaluate the vascular anatomy. RADIATION DOSE REDUCTION: This exam was performed according to the departmental dose-optimization program which includes automated exposure control, adjustment of the mA and/or kV according to patient size and/or use of iterative reconstruction technique. CONTRAST:  75mL OMNIPAQUE IOHEXOL 350 MG/ML SOLN COMPARISON:  Chest radiograph from earlier today. CT chest 06/02/2017 FINDINGS: Cardiovascular: Motion artifact diminishes exam detail within the upper and lower lung zones. Within this limitation, there is no sign of acute pulmonary embolism to the level of the distal lobar pulmonary arteries. Previous median sternotomy. Heart size is normal. No pericardial effusion. Aortic atherosclerosis. Left circumflex coronary artery calcification noted. Mediastinum/Nodes: No mediastinal or hilar adenopathy. Thyroid gland, trachea and esophagus are unremarkable. Lungs/Pleura: Small to moderate right pleural effusion. Focal area of loculated fluid along the anterior aspect of the minor fissure noted. Mild consolidative change within the posterior right base noted. Areas of subsegmental atelectasis identified in the posterior and anterior segments of the right lower lobe. Upper Abdomen: No acute abnormality. Musculoskeletal: No chest wall abnormality. No acute or significant osseous findings. Review of the MIP images confirms the above findings. IMPRESSION: 1. Motion  artifact diminishes exam detail within the upper and lower lung zones. Within this limitation, there is no sign of acute pulmonary embolism to the level of the distal lobar pulmonary arteries. 2. Small to moderate right pleural effusion. Focal area of loculated fluid along the anterior aspect of the minor fissure noted. 3. Mild consolidative change within the posterior right base noted. Areas of subsegmental atelectasis identified in the posterior and anterior segments of the right lower lobe. 4. Coronary artery calcifications noted. 5.  Aortic Atherosclerosis (ICD10-I70.0). 1. Electronically Signed   By: Signa Kell M.D.   On: 03/21/2023 05:57   DG Chest Portable 1 View Result Date: 03/21/2023 CLINICAL DATA:  Cough, shortness of breath, headaches and sore throat, nasal congestion. 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 EXAM: PORTABLE CHEST 1 VIEW COMPARISON:  Chest CT with contrast 02/28/2023 FINDINGS: Airspace disease intermixed with linear atelectasis or scarring is again noted in the right lung base, but was seen previously and could be XRT related. Underlying pneumonia would be difficult to exclude. A small layering right pleural effusion appears similar. The remaining hypoexpanded lungs are generally clear. There is mild cardiomegaly, sternotomy sutures. Stable mediastinal configuration. No vascular congestion is seen.  No new osseous findings. IMPRESSION: 1. Airspace disease intermixed with linear atelectasis or  scarring in the right lung base, but was seen previously and could be XRT related. Underlying pneumonia would be difficult to exclude. No new abnormality. 2. Small layering right pleural effusion appears similar. 3. Mild cardiomegaly. Electronically Signed   By: Almira Bar M.D.   On: 03/21/2023 04:57    ____________________________________________   PROCEDURES  Procedure(s) performed:   Procedures  CRITICAL CARE Performed by: Maia Plan Total critical  care time: 35 minutes Critical care time was exclusive of separately billable procedures and treating other patients. Critical care was necessary to treat or prevent imminent or life-threatening deterioration. Critical care was time spent personally by me on the following activities: development of treatment plan with patient and/or surrogate as well as nursing, discussions with consultants, evaluation of patient's response to treatment, examination of patient, obtaining history from patient or surrogate, ordering and performing treatments and interventions, ordering and review of laboratory studies, ordering and review of radiographic studies, pulse oximetry and re-evaluation of patient's condition.  Alona Bene, MD Emergency Medicine  ____________________________________________   INITIAL IMPRESSION / ASSESSMENT AND PLAN / ED COURSE  Pertinent labs & imaging results that were available during my care of the patient were reviewed by me and considered in my medical decision making (see chart for details).   This patient is Presenting for Evaluation of SOB, which does require a range of treatment options, and is a complaint that involves a high risk of morbidity and mortality.  The Differential Diagnoses include CAD, PE, CHF, COPD, viral process, CAP, etc.  Critical Interventions-    Medications  azithromycin (ZITHROMAX) 500 mg in sodium chloride 0.9 % 250 mL IVPB (500 mg Intravenous New Bag/Given 03/21/23 0616)  oseltamivir (TAMIFLU) capsule 75 mg (75 mg Oral Given 03/21/23 0620)  insulin aspart (novoLOG) injection 0-15 Units (has no administration in time range)  insulin aspart (novoLOG) injection 0-5 Units (has no administration in time range)  albuterol (PROVENTIL) (2.5 MG/3ML) 0.083% nebulizer solution 2.5 mg (has no administration in time range)  guaiFENesin (ROBITUSSIN) 100 MG/5ML liquid 5 mL (has no administration in time range)  loratadine (CLARITIN) tablet 10 mg (has no  administration in time range)  torsemide (DEMADEX) tablet 20 mg (has no administration in time range)  metoprolol tartrate (LOPRESSOR) injection 5 mg (has no administration in time range)  ipratropium-albuterol (DUONEB) 0.5-2.5 (3) MG/3ML nebulizer solution 3 mL (3 mLs Nebulization Given 03/21/23 0433)  cefTRIAXone (ROCEPHIN) 1 g in sodium chloride 0.9 % 100 mL IVPB (0 g Intravenous Stopped 03/21/23 0621)  iohexol (OMNIPAQUE) 350 MG/ML injection 75 mL (75 mLs Intravenous Contrast Given 03/21/23 0545)    Reassessment after intervention: WOB improved with supplemental O2.    I did obtain Additional Historical Information from family at bedside.  I decided to review pertinent External Data, and in summary UC visit just PTA.   Clinical Laboratory Tests Ordered, included CBC without leukocytosis. HR improved with SOB mgmt. Doubt sepsis. Flu A. No AKI. BNP normal.   Radiologic Tests Ordered, included CXR. I independently interpreted the images and agree with radiology interpretation.   Cardiac Monitor Tracing which shows sinus tachycardia.    Social Determinants of Health Risk patient is a non-smoker.   Consult complete with [facility]. Plan for admit.   Medical Decision Making: Summary:  The patient presents to the emergency department for evaluation of shortness of breath.  Some chest discomfort earlier but not currently.  She does not appear particularly volume overloaded but arrives with tachycardia and borderline room air saturations.  Had some subjective and improvement with DuoNebs prior to arrival.  Plan for third nebulizer treatment and will repeat chest x-ray along with screening blood work.  Patient may benefit from CTA PE scan ultimately.  Reevaluation with update and discussion with patient. Initially ordered abx with ? Infiltrate on CXR but patient is testing positive for Flu A. HR improved with respiratory treatment and O2. Doubt sepsis in this setting.    Patient's presentation is most  consistent with acute presentation with potential threat to life or bodily function.   Disposition: admit  ____________________________________________  FINAL CLINICAL IMPRESSION(S) / ED DIAGNOSES  Final diagnoses:  Acute respiratory failure with hypoxia (HCC)  Influenza A    Note:  This document was prepared using Dragon voice recognition software and may include unintentional dictation errors.  Alona Bene, MD, [facility] Emergency Medicine    Gaylyn Berish, Arlyss Repress, MD 03/21/23 [phone]

## 2023-03-21 NOTE — H&P (Signed)
 History and Physical    Kaitlin [name] ZOX:[id number] DOB: 04-Feb-1965 DOA: 03/21/2023  PCP: Kaitlin Lien, PA-C  Patient coming from: home  I have personally briefly reviewed patient's old medical records in [facility]  Chief Complaint: SOB  HPI: Kaitlin [name] is Kaitlin [name] 58 y.o. 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  Her symptoms started about Kaitlin [name] week ago with cold like symptoms.  She was tested for covid and flu at the time and was negative.  She's had progressive symptoms since then and presented to the hospital due to worsening shortness of breath.  She notes headache, congestion, sore throat.  Having Kaitlin Mondry non productive cough.  Notes right sided CP related to cough.  Notes nausea, no vomiting.  Denies ever smoking.  No etoh.  ED Course: labs, imaging.  Admit with flu [name] infection.    Review of Systems: As per HPI otherwise all other systems reviewed and are negative.  Past Medical History:  Diagnosis Date   Anemia    Bronchitis    Cataracts, bilateral 01/12/2018   CHF (congestive heart failure) (HCC)    Diabetes mellitus    High cholesterol    Hypertension    takes medicine to protect kidneys, does not have HTN   Leukocytosis    Morbid obesity (HCC) 09/11/2011   BMI 57   Neuropathy 01/12/2018   NICM (nonischemic cardiomyopathy) (HCC) 05/01/2013   Overview:  Last Assessment & Plan:  euvolemic on physical exam.    Obesity    Sickle cell trait (HCC)    Thymoma     Past Surgical History:  Procedure Laterality Date   COLONOSCOPY Kaitlin [name] 06/21/2017   Procedure: COLONOSCOPY;  Surgeon: Kerin Salen, MD;  Location: [facility] ENDOSCOPY;  Service: Gastroenterology;  Laterality: Kaitlin [name];   LEFT HEART CATHETERIZATION WITH CORONARY ANGIOGRAM Kaitlin [name] 05/25/2013   Procedure: LEFT HEART CATHETERIZATION WITH CORONARY ANGIOGRAM;  Surgeon: Kaitlin Bihari, MD;   Location: [facility] CATH LAB;  Service: Cardiovascular;  Laterality: Kaitlin [name];   LYMPH NODE BIOPSY Right 11/30/2012   Procedure: RIGHT TONSIL BIOPSY WITH FRESH FROZEN ANALYSIS;  Surgeon: Kaitlin Shanks, MD;  Location: [facility] OR;  Service: ENT;  Laterality: Right;   MEDIASTERNOTOMY Kaitlin [name] 01/12/2013   Procedure: MEDIAN STERNOTOMY;  Surgeon: Kaitlin Borne, MD;  Location: [facility] OR;  Service: Thoracic;  Laterality: Kaitlin [name];   MEDIASTINOTOMY CHAMBERLAIN MCNEIL    Right 11/06/2012   Procedure: MEDIASTINOTOMY CHAMBERLAIN MCNEIL PROCEDURE;  Surgeon: Kaitlin Borne, MD;  Location: [facility] OR;  Service: Thoracic;  Laterality: Right;   POLYPECTOMY  06/21/2017   Procedure: POLYPECTOMY;  Surgeon: Kerin Salen, MD;  Location: [facility];  Service: Gastroenterology;;   RESECTION OF Kaitlin [name] THYMOMA N/Kaitlin [name] 01/12/2013   Procedure: RESECTION OF Kaitlin [name] THYMOMA;  Surgeon: Kaitlin Borne, MD;  Location: [facility] OR;  Service: Thoracic;  Laterality: N/Viaan [name];   TONSILLECTOMY     TUBAL LIGATION      Social History  reports that she has never smoked. She has been exposed to tobacco smoke. She has never used smokeless tobacco. She reports that she does not drink alcohol and does not use drugs.  Allergies  Allergen Reactions   Amoxicillin-Pot Clavulanate Diarrhea and Other (See Comments)    Did it involve swelling of the face/tongue/throat, SOB, or low BP? No  Did it involve sudden or severe rash/hives, skin peeling, or any reaction on the inside  of your mouth or nose? No  Did you need to seek medical attention at [facility] or doctor's office? No  When did it last happen?    Unknown    If all above answers are "NO", may proceed with cephalosporin use.   Ibuprofen Other (See Comments)    Per MD she should no longer take, unsure why Other reaction(s): Other (See Comments), Other (See Comments) Per MD she should no longer take, unsure why Per MD she should no longer take, unsure why Per MD she should no longer take, unsure why Per MD she should no longer take, unsure  why    Amoxicillin Diarrhea    Family History  Problem Relation Age of Onset   Diabetes Mother    Heart disease Father        No details   Diabetes Maternal Aunt    Diabetes Maternal Uncle    Diabetes Maternal Grandmother    Glaucoma Maternal Grandmother    Breast cancer Maternal Grandmother    Diabetes Maternal Grandfather    Diabetes type II Other        Family HX   Breast cancer Other    Prior to Admission medications   Medication Sig Start Date End Date Taking? Authorizing Provider  acetaminophen (TYLENOL) 325 MG tablet Take 2 tablets (650 mg total) by mouth every 6 (six) hours as needed for moderate pain (pain score 4-6). 01/02/23   Kaitlin Bamberg, PA-C  albuterol (VENTOLIN HFA) 108 [phone] Base) MCG/ACT inhaler One puff every 6 hour as needed 03/09/19   Angiulli, Kaitlin Rossetti, PA-C  ascorbic acid (VITAMIN C) 500 MG tablet Take 1,500 mg by mouth daily.    [provider]  aspirin EC 81 MG tablet Take 1 tablet (81 mg total) by mouth daily. 05/18/16   Kaitlin Rotunda, MD  BENZONATATE PO Take by mouth 3 (three) times daily as needed for cough.    [provider]  calcium-vitamin D (OSCAL WITH D) 500-200 MG-UNIT tablet Take 2 tablets by mouth daily with breakfast. 03/09/19   Angiulli, Kaitlin Rossetti, PA-C  carvedilol (COREG) 12.5 MG tablet Take 12.5 mg by mouth 2 (two) times daily. 10/11/21   [provider]  Cholecalciferol (VITAMIN D-3) 125 MCG (5000 UT) TABS Take 5,000 Units by mouth daily. 03/09/19   Angiulli, Kaitlin Rossetti, PA-C  Continuous Blood Gluc Receiver (DEXCOM G6 RECEIVER) DEVI Use as directed for continuous glucose monitoring. 04/25/20   [provider]  Continuous Blood Gluc Sensor (DEXCOM G6 SENSOR) MISC Inject 1 sensor to the skin every 10 days for continuous glucose monitoring. 04/25/20   [provider]  Continuous Blood Gluc Transmit (DEXCOM G6 TRANSMITTER) MISC Use as directed for continuous glucose monitoring. Reuse transmitter for 90 days then  discard and replace. 04/25/20   [provider]  Cyanocobalamin 2000 MCG TBCR Take by mouth.    [provider]  diclofenac (VOLTAREN) 50 MG EC tablet TAKE 1 TABLET(50 MG) BY MOUTH DAILY FOR ARTHRITIS 03/17/23   Lovorn, Aundra Millet, MD  FEROSUL 325 (65 Fe) MG tablet TAKE 1 TABLET(325 MG) BY MOUTH DAILY WITH BREAKFAST 09/20/22   Lovorn, Aundra Millet, MD  fexofenadine (ALLEGRA) 180 MG tablet Take 180 mg by mouth daily.    [provider]  fluticasone (FLONASE) 50 MCG/ACT nasal spray Place 1 spray into both nostrils daily. 10/03/22   Radford Pax, NP  HUMALOG 100 UNIT/ML injection Inject into the skin.    [provider]  insulin aspart (NOVOLOG) 100  UNIT/ML injection     [provider]  Insulin Human (INSULIN PUMP) SOLN Inject into the skin. Novolog    [provider]  ipratropium (ATROVENT) 0.06 % nasal spray     [provider]  lisinopril (ZESTRIL) 5 MG tablet Take 5 mg by mouth daily.    [provider]  magnesium oxide (MAG-OX) 400 (241.3 Mg) MG tablet Take 1 tablet (400 mg total) by mouth 2 (two) times daily. Patient taking differently: Take 400 mg by mouth daily. 03/09/19   Angiulli, Kaitlin Rossetti, PA-C  Multiple Vitamin (MULTIVITAMIN WITH MINERALS) TABS tablet Take 1 tablet by mouth daily.    [provider]  NOVOLOG 100 UNIT/ML injection Inject into the skin. 09/19/19   [provider]  potassium chloride SA (KLOR-CON) 20 MEQ tablet Take 20 mEq by mouth 2 (two) times daily.    [provider]  rosuvastatin (CRESTOR) 40 MG tablet Take 1 tablet (40 mg total) by mouth daily. 03/09/19   Angiulli, Kaitlin Rossetti, PA-C  torsemide (DEMADEX) 20 MG tablet Take 1 tablet (20 mg total) by mouth 2 (two) times daily. 03/09/19   Charlton Amor, PA-C    Physical Exam: Vitals:   03/21/23 1300 03/21/23 1319 03/21/23 1430 03/21/23 1600  BP: 127/79 127/79 114/64 125/79  Pulse: (!) 111 (!) 113 (!) 112 (!) 115  Resp: (!) 23 (!) 22 (!) 29  18  Temp:  98.9 F (37.2 C)  99.3 F (37.4 C)  TempSrc:  Oral  Oral  SpO2: 94% 93% 96% 95%  Weight:      Height:        Constitutional: NAD, calm, comfortable Vitals:   03/21/23 1300 03/21/23 1319 03/21/23 1430 03/21/23 1600  BP: 127/79 127/79 114/64 125/79  Pulse: (!) 111 (!) 113 (!) 112 (!) 115  Resp: (!) 23 (!) 22 (!) 29 18  Temp:  98.9 F (37.2 C)  99.3 F (37.4 C)  TempSrc:  Oral  Oral  SpO2: 94% 93% 96% 95%  Weight:      Height:       Eyes: PERRL, lids and conjunctivae normal ENMT: Mucous membranes are moist.  Neck: normal, supple Respiratory: distant, unlabored, difficult due to body habitus Cardiovascular: RRR.  Trace bilateral LE edema. Abdomen: no tenderness, no masses palpated. Musculoskeletal: no clubbing / cyanosis. No joint deformity upper and lower extremities. Good ROM, no contractures. Normal muscle tone.  Skin: lesion to L upper back  Neurologic: CN 2-12 grossly intact. Sensation intact, DTR normal. Strength 5/5 in all 4.  Psychiatric: Normal judgment and insight. Alert and oriented x 3. Normal mood.   Labs on Admission: I have personally reviewed following labs and imaging studies  CBC: Recent Labs  Lab 03/21/23 0425  WBC 8.0  NEUTROABS 6.8  HGB 13.7  HCT 40.5  MCV 77.9*  PLT 238    Basic Metabolic Panel: Recent Labs  Lab 03/21/23 0425  NA 137  K 4.0  CL 102  CO2 25  GLUCOSE 161*  BUN 18  CREATININE 0.98  CALCIUM 8.8*    GFR: Estimated Creatinine Clearance: 108.9 mL/min (by C-G formula based on SCr of 0.98 mg/dL).  Liver Function Tests: Recent Labs  Lab 03/21/23 0425  AST 28  ALT 25  ALKPHOS 81  BILITOT 0.5  PROT 6.7  ALBUMIN 2.9*    Urine analysis:    Component Value Date/Time   COLORURINE YELLOW 01/31/2013 1138   APPEARANCEUR CLEAR 01/31/2013 1138   LABSPEC 1.029 01/31/2013 1138  PHURINE 6.0 01/31/2013 1138   GLUCOSEU NEG 01/31/2013 1138   HGBUR NEG 01/31/2013 1138   BILIRUBINUR NEG 01/31/2013 1138    KETONESUR NEG 01/31/2013 1138   PROTEINUR NEG 01/31/2013 1138   UROBILINOGEN 0.2 01/31/2013 1138   NITRITE NEG 01/31/2013 1138   LEUKOCYTESUR NEG 01/31/2013 1138    Radiological Exams on Admission: CT Angio Chest PE W and/or Wo Contrast Result Date: 03/21/2023 CLINICAL DATA:  Evaluate for pulmonary embolism. Complains of cough, shortness of breath, nasal congestion. EXAM: CT ANGIOGRAPHY CHEST WITH CONTRAST TECHNIQUE: Multidetector CT imaging of the chest was performed using the standard protocol during bolus administration of intravenous contrast. Multiplanar CT image reconstructions and MIPs were obtained to evaluate the vascular anatomy. RADIATION DOSE REDUCTION: This exam was performed according to the departmental dose-optimization program which includes automated exposure control, adjustment of the mA and/or kV according to patient size and/or use of iterative reconstruction technique. CONTRAST:  75mL OMNIPAQUE IOHEXOL 350 MG/ML SOLN COMPARISON:  Chest radiograph from earlier today. CT chest 06/02/2017 FINDINGS: Cardiovascular: Motion artifact diminishes exam detail within the upper and lower lung zones. Within this limitation, there is no sign of acute pulmonary embolism to the level of the distal lobar pulmonary arteries. Previous median sternotomy. Heart size is normal. No pericardial effusion. Aortic atherosclerosis. Left circumflex coronary artery calcification noted. Mediastinum/Nodes: No mediastinal or hilar adenopathy. Thyroid gland, trachea and esophagus are unremarkable. Lungs/Pleura: Small to moderate right pleural effusion. Focal area of loculated fluid along the anterior aspect of the minor fissure noted. Mild consolidative change within the posterior right base noted. Areas of subsegmental atelectasis identified in the posterior and anterior segments of the right lower lobe. Upper Abdomen: No acute abnormality. Musculoskeletal: No chest wall abnormality. No acute or significant osseous  findings. Review of the MIP images confirms the above findings. IMPRESSION: 1. Motion artifact diminishes exam detail within the upper and lower lung zones. Within this limitation, there is no sign of acute pulmonary embolism to the level of the distal lobar pulmonary arteries. 2. Small to moderate right pleural effusion. Focal area of loculated fluid along the anterior aspect of the minor fissure noted. 3. Mild consolidative change within the posterior right base noted. Areas of subsegmental atelectasis identified in the posterior and anterior segments of the right lower lobe. 4. Coronary artery calcifications noted. 5.  Aortic Atherosclerosis (ICD10-I70.0). 1. Electronically Signed   By: Signa Kell M.D.   On: 03/21/2023 05:57   DG Chest Portable 1 View Result Date: 03/21/2023 CLINICAL DATA:  Cough, shortness of breath, headaches and sore throat, nasal congestion. There is Khristian Phillippi history of thymoma with pleural metastasis and XRT. EXAM: PORTABLE CHEST 1 VIEW COMPARISON:  Chest CT with contrast 02/28/2023 FINDINGS: Airspace disease intermixed with linear atelectasis or scarring is again noted in the right lung base, but was seen previously and could be XRT related. Underlying pneumonia would be difficult to exclude. Darl Brisbin small layering right pleural effusion appears similar. The remaining hypoexpanded lungs are generally clear. There is mild cardiomegaly, sternotomy sutures. Stable mediastinal configuration. No vascular congestion is seen.  No new osseous findings. IMPRESSION: 1. Airspace disease intermixed with linear atelectasis or scarring in the right lung base, but was seen previously and could be XRT related. Underlying pneumonia would be difficult to exclude. No new abnormality. 2. Small layering right pleural effusion appears similar. 3. Mild cardiomegaly. Electronically Signed   By: Almira Bar M.D.   On: 03/21/2023 04:57    EKG: Independently reviewed. Sinus tach on  EKG  Assessment/Plan Principal  Problem:   Acute respiratory failure with hypoxia (HCC) Active Problems:   Flu    Assessment and Plan:  Acute Hypoxic Respiratory Failure Influenza [place] Deleo Infection Superimposed Bacterial Pneumonia Right Pleural Effusion Currently on 3-4 L [place] CT without PE (limited), small to moderate R effusion, loculated fluid along anterior aspect of the minor fissure, mild consolidative change within posterior right base (see report) Will repeat CXR in 2-3 days to follow R effusion (CT 02/28/2023 with small loculated R effusion -> suspect related to hx pleural metastasis below?) Continue tamiflu  Continue ceftriaxone/azithromycin MRSA PCR, sputum cx Urine strep, urine legionella No blood cultures collected Wean O2 as tolerated  Chronic Diastolic Heart Failure  EF 01/2019 45-50% Trace edema today on exam, will schedule torsemide daily rather than every other day as she typically takes this Continue coreg Watch potassium while getting PO kcl Will repeat echo while she's admitted  Thymoma with Pleural Metastasis  S/p stereotactic body radiation therapy Next Rad Onc visit appears to be in June at [facility] need to follow with oncology as well   T2DM Continue insulin pump  She has new rx for mounjaro, but hasn't started this yet Aspirin, statin  Hypertension Coreg, lisinopril, torsemide  Dyslipidemia Crestor   Arthritis Holding NSAID  Skin Lesion  To left upper back Needs dermatology follow up for biopsy - discussed with her at bedside    DVT prophylaxis: lovenox  Code Status:   full  Family Communication:  none  Disposition Plan:   Patient is from:  home  Anticipated DC to:  home  Anticipated DC date:  Pending improvement in respiratory status  Anticipated DC barriers: Resolution of hypoxia  Consults called:  none  Admission status:  none   Severity of Illness: The appropriate patient status for this patient is INPATIENT. Inpatient status is judged to be reasonable and  necessary in order to provide the required intensity of service to ensure the patient's safety. The patient's presenting symptoms, physical exam findings, and initial radiographic and laboratory data in the context of their chronic comorbidities is felt to place them at high risk for further clinical deterioration. Furthermore, it is not anticipated that the patient will be medically stable for discharge from the hospital within 2 midnights of admission.   * I certify that at the point of admission it is my clinical judgment that the patient will require inpatient hospital care spanning beyond 2 midnights from the point of admission due to high intensity of service, high risk for further deterioration and high frequency of surveillance required.Lacretia Nicks MD [facility]  How to contact the [facility] Attending or Consulting provider 7A - 7P or covering provider during after hours 7P -7A, for this patient?   Check the care team in [facility] and look for Edder Bellanca) attending/consulting [facility] provider listed and b) the [facility] team listed Log into [email or web address] and use [place]'s universal password to access. If you do not have the password, please contact the hospital operator. Locate the [facility] provider you are looking for under [facility] and page to Laverta Harnisch number that you can be directly reached. If you still have difficulty reaching the provider, please page the [facility] (Director on Call) for the Hospitalists listed on amion for assistance.  03/21/2023, 4:40 PM

## 2023-03-21 NOTE — ED Notes (Signed)
 [facility] called for hospitalist consult.

## 2023-03-21 NOTE — ED Notes (Signed)
 Patient asleep, no resp. distress noted, SpO2 87%.  [place]uldin increased to 4lpm.

## 2023-03-21 NOTE — ED Triage Notes (Signed)
 Cough, shob, headache, sore throat, nasal congestion since Thursday. Initially cough was productive but now is non-productive. [facility], tested negative for covid, and flu. States she was told she might need a CT scan.

## 2023-03-22 ENCOUNTER — Inpatient Hospital Stay (HOSPITAL_COMMUNITY)

## 2023-03-22 DIAGNOSIS — R008 Other abnormalities of heart beat: Secondary | ICD-10-CM

## 2023-03-22 DIAGNOSIS — J9601 Acute respiratory failure with hypoxia: Secondary | ICD-10-CM

## 2023-03-22 LAB — ECHOCARDIOGRAM COMPLETE
AR max vel: 0.89 cm2
AV Area VTI: 1.07 cm2
AV Area mean vel: 0.99 cm2
AV Mean grad: 5 mmHg
AV Peak grad: 9.5 mmHg
Ao pk vel: 1.54 m/s
Area-P 1/2: 4.31 cm2
Calc EF: 47 %
Height: 68 in
MV VTI: 1.31 cm2
S' Lateral: 3.6 cm
Single Plane A2C EF: 50.6 %
Single Plane A4C EF: 46.8 %
Weight: 6000 [oz_av]

## 2023-03-22 LAB — COMPREHENSIVE METABOLIC PANEL
ALT: 25 U/L (ref 0–44)
AST: 25 U/L (ref 15–41)
Albumin: 3 g/dL — ABNORMAL LOW (ref 3.5–5.0)
Alkaline Phosphatase: 72 U/L (ref 38–126)
Anion gap: 5 (ref 5–15)
BUN: 18 mg/dL (ref 6–20)
CO2: 29 mmol/L (ref 22–32)
Calcium: 8.9 mg/dL (ref 8.9–10.3)
Chloride: 104 mmol/L (ref 98–111)
Creatinine, Ser: 1.29 mg/dL — ABNORMAL HIGH (ref 0.44–1.00)
GFR, Estimated: 49 mL/min — ABNORMAL LOW (ref >60)
Glucose, Bld: 138 mg/dL — ABNORMAL HIGH (ref 70–99)
Potassium: 5.2 mmol/L — ABNORMAL HIGH (ref 3.5–5.1)
Sodium: 138 mmol/L (ref 135–145)
Total Bilirubin: 0.4 mg/dL (ref 0.0–1.2)
Total Protein: 6.7 g/dL (ref 6.5–8.1)

## 2023-03-22 LAB — RESPIRATORY PANEL BY PCR

## 2023-03-22 LAB — GLUCOSE, CAPILLARY
Glucose-Capillary: 150 mg/dL — ABNORMAL HIGH (ref 70–99)
Glucose-Capillary: 132 mg/dL — ABNORMAL HIGH (ref 70–99)
Glucose-Capillary: 151 mg/dL — ABNORMAL HIGH (ref 70–99)
Glucose-Capillary: 214 mg/dL — ABNORMAL HIGH (ref 70–99)
Glucose-Capillary: 191 mg/dL — ABNORMAL HIGH (ref 70–99)
Glucose-Capillary: 209 mg/dL — ABNORMAL HIGH (ref 70–99)
Glucose-Capillary: 281 mg/dL — ABNORMAL HIGH (ref 70–99)

## 2023-03-22 LAB — STREP PNEUMONIAE URINARY ANTIGEN: Strep Pneumo Urinary Antigen: NEGATIVE

## 2023-03-22 LAB — MRSA NEXT GEN BY PCR, NASAL: MRSA by PCR Next Gen: NOT DETECTED

## 2023-03-22 LAB — BLOOD GAS, VENOUS
Acid-Base Excess: 3 mmol/L — ABNORMAL HIGH (ref 0.0–2.0)
Acid-Base Excess: 3.2 mmol/L — ABNORMAL HIGH (ref 0.0–2.0)
Bicarbonate: 32.5 mmol/L — ABNORMAL HIGH (ref 20.0–28.0)
Bicarbonate: 30.9 mmol/L — ABNORMAL HIGH (ref 20.0–28.0)
O2 Saturation: 87.9 %
O2 Saturation: 95.3 %
Patient temperature: 37.1
Patient temperature: 37.1
pCO2, Ven: 74 mmHg (ref 44–60)
pCO2, Ven: 60 mmHg (ref 44–60)
pH, Ven: 7.25 (ref 7.25–7.43)
pH, Ven: 7.32 (ref 7.25–7.43)
pO2, Ven: 56 mmHg — ABNORMAL HIGH (ref 32–45)
pO2, Ven: 69 mmHg — ABNORMAL HIGH (ref 32–45)

## 2023-03-22 LAB — EXPECTORATED SPUTUM ASSESSMENT W GRAM STAIN, RFLX TO RESP C

## 2023-03-22 LAB — LACTIC ACID, PLASMA
Lactic Acid, Venous: 1.2 mmol/L (ref 0.5–1.9)
Lactic Acid, Venous: 0.8 mmol/L (ref 0.5–1.9)

## 2023-03-22 LAB — CBC
HCT: 42.8 % (ref 36.0–46.0)
Hemoglobin: 13.4 g/dL (ref 12.0–15.0)
MCH: 26.4 pg (ref 26.0–34.0)
MCHC: 31.3 g/dL (ref 30.0–36.0)
MCV: 84.3 fL (ref 80.0–100.0)
Platelets: 233 10*3/uL (ref 150–400)
RBC: 5.08 MIL/uL (ref 3.87–5.11)
RDW: 15.9 % — ABNORMAL HIGH (ref 11.5–15.5)
WBC: 9.4 10*3/uL (ref 4.0–10.5)
nRBC: 0 % (ref 0.0–0.2)

## 2023-03-22 MED ORDER — INSULIN PUMP
Freq: Three times a day (TID) | SUBCUTANEOUS | Status: DC
  Filled 2023-03-22: qty 1

## 2023-03-22 MED ORDER — PERFLUTREN LIPID MICROSPHERE
1.0000 mL | INTRAVENOUS | Status: AC | PRN
  Administered 2023-03-22: 3 mL via INTRAVENOUS

## 2023-03-22 MED ORDER — LACTATED RINGERS IV BOLUS
500.0000 mL | Freq: Once | INTRAVENOUS | Status: AC
  Administered 2023-03-22: 500 mL via INTRAVENOUS

## 2023-03-22 MED ORDER — IPRATROPIUM-ALBUTEROL 0.5-2.5 (3) MG/3ML IN SOLN
3.0000 mL | Freq: Three times a day (TID) | RESPIRATORY_TRACT | Status: DC
  Administered 2023-03-23 (×3): 3 mL via RESPIRATORY_TRACT
  Filled 2023-03-22 (×3): qty 3

## 2023-03-22 MED ORDER — LACTATED RINGERS IV BOLUS
1000.0000 mL | Freq: Once | INTRAVENOUS | Status: AC
  Administered 2023-03-22: 1000 mL via INTRAVENOUS

## 2023-03-22 MED ORDER — CARVEDILOL 6.25 MG PO TABS
6.2500 mg | ORAL_TABLET | Freq: Two times a day (BID) | ORAL | Status: DC
  Administered 2023-03-22: 6.25 mg via ORAL
  Filled 2023-03-22: qty 1

## 2023-03-22 MED ORDER — IPRATROPIUM-ALBUTEROL 0.5-2.5 (3) MG/3ML IN SOLN
3.0000 mL | Freq: Four times a day (QID) | RESPIRATORY_TRACT | Status: DC
  Administered 2023-03-22 (×2): 3 mL via RESPIRATORY_TRACT
  Filled 2023-03-22 (×2): qty 3

## 2023-03-22 MED ORDER — INSULIN ASPART 100 UNIT/ML IJ SOLN: 0.0000 [IU] | Freq: Three times a day (TID) | INTRAMUSCULAR | Status: DC

## 2023-03-22 MED ORDER — INSULIN ASPART 100 UNIT/ML IJ SOLN: 0.0000 [IU] | Freq: Every day | INTRAMUSCULAR | Status: DC

## 2023-03-22 MED ORDER — PREDNISONE 20 MG PO TABS
40.0000 mg | ORAL_TABLET | Freq: Every day | ORAL | Status: AC
  Administered 2023-03-22 – 2023-03-24 (×3): 40 mg via ORAL
  Filled 2023-03-22 (×3): qty 2

## 2023-03-22 NOTE — Inpatient Diabetes Management (Signed)
 Inpatient Diabetes Program Recommendations  AACE/ADA: New Consensus Statement on Inpatient Glycemic Control (2015)  Target Ranges:  Prepandial:   less than 140 mg/dL      Peak postprandial:   less than 180 mg/dL (1-2 hours)      Critically ill patients:  140 - 180 mg/dL   Lab Results  Component Value Date   GLUCAP 214 (H) 03/22/2023   HGBA1C 6.5 (H) 03/21/2023    Review of Glycemic Control  Latest Reference Range & Units 03/21/23 17:36 03/21/23 20:39 03/22/23 01:22 03/22/23 07:33 03/22/23 07:47 03/22/23 11:49  Glucose-Capillary 70 - 99 mg/dL 161 (H) 096 (H) 045 (H) 132 (H) 151 (H) 214 (H)  (H): Data is abnormally high  Diabetes history: DM2 Outpatient Diabetes medications:  Omnipod with G7-[facility]  12a-4a-2.1  4a-12p-4  12p-4p-3.8  4p-12a-4  Total daily basal 87.6/day Automode  CR:  12a-4a 5  4a-12a-4  Target 110  Current orders for Inpatient glycemic control: Insulin pump therapy  Met with patient at bedside.  Reviewed insulin pump settings.  Will continue to follow while inpatient.  Thank you, Dulce Sellar, MSN, CDCES Diabetes Coordinator Inpatient Diabetes Program [phone] (team pager from 8a-5p)

## 2023-03-22 NOTE — Plan of Care (Signed)

## 2023-03-22 NOTE — Progress Notes (Signed)
  Echocardiogram 2D Echocardiogram has been performed.  Ocie Doyne RDCS 03/22/2023, 10:20 AM

## 2023-03-22 NOTE — Progress Notes (Addendum)
 PROGRESS NOTE    Kaitlin [name]  ZOX:[id number] DOB: Jul 22, 1965 DOA: 03/21/2023 PCP: Judd Lien, PA-C  Chief Complaint  Patient presents with   Shortness of Breath    Brief Narrative:   Kaitlin [name] is Kaitlin [name] 58 y.o. 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   Admitted with influenza [name] infection with superimposed bacterial pneumonia.  Assessment & Plan:   Principal Problem:   Acute respiratory failure with hypoxia (HCC) Active Problems:   Flu  Addendum 2:33 PM called regarding some encephalopathy for Kaitlin [name].  Alert and oriented, but poor attention, like she's going to fall asleep.  SBP in 80's.  Give 1 L LR.  Follow lactic acid.  Follow venous blood gas for encephalopathy.  Hold further coreg.  Change level of care to progressive.  Will follow.  Exam notable for pt being alert/oriented, but poor attention.  Unlabored.  Moving better air this morning on pulm exam, anterior exam with distant breath sounds - faint wheezing appreciated.  No asterixis.    Reevaluated.  She's stable.  Notes feeling comfortable continuing to manage her insulin pump.  She's awake, alert, and appropriate.  Cards c/s for reduced EF.   Acute Hypoxic Respiratory Failure Influenza Crews Mccollam Infection Superimposed Bacterial Pneumonia Right Pleural Effusion Sepsis  Wheezing Currently on 4 L [place] - fever today.  Technically meets criteria for sepsis with fever, tachycardia, hypoxia.   CT without PE (limited), small to moderate R effusion, loculated fluid along anterior aspect of the minor fissure, mild consolidative change within posterior right base (see report) Will repeat CXR in 2-3 days to follow R effusion (CT 02/28/2023 with small loculated R effusion -> suspect related to hx pleural metastasis below? Consider possible thora if worsening) Continue tamiflu  Continue  ceftriaxone/azithromycin Wheezing, diminished air movement on exam today, no hx asthma/copd -> plan 3 day steroid course with her diabetes hx, monitor closely Gentle IVF bolus, caution in setting of hx HF below MRSA PCR pending, sputum cx pending collection Urine strep, urine legionella Blood cultures pending Wean O2 as tolerated   Generalized Weakness Due to above, has chronic LLE weakness of unclear cause Ok to monitor for now - exacerbation of weakness due to infection above Venous blood gas  Acute Kidney Injury Hold torsemide and lisinopril  Follow with IVF bolus  Chronic Diastolic Heart Failure  EF 01/2019 45-50% Hold torsemide in setting of sepsis above, BP trending down Continue coreg reduced dose in setting of sepsis Holding home potassium  Will repeat echo while she's admitted   Thymoma with Pleural Metastasis  S/p stereotactic body radiation therapy Next Rad Onc visit appears to be in June at [facility] need to follow with oncology as well    T2DM Continue insulin pump - monitor with steroids  She has new rx for mounjaro, but hasn't started this yet Aspirin, statin   Hypertension Coreg reduced dose Holding lisinopril, torsemide   Dyslipidemia Crestor    Arthritis Holding NSAID   Skin Lesion  To left upper back Needs dermatology follow up for biopsy - discussed with her at bedside    DVT prophylaxis: lovenox Code Status: full Family Communication: none at bedside Disposition:   Status is: Inpatient Remains inpatient appropriate because: need for further inpatient care   Consultants:  none  Procedures:  none  Antimicrobials:  Anti-infectives (From admission, onward)  Start     Dose/Rate Route Frequency Ordered Stop   03/22/23 1000  cefTRIAXone (ROCEPHIN) 2 g in sodium chloride 0.9 % 100 mL IVPB       Note to Pharmacy: Retime as needed   2 g 200 mL/hr over 30 Minutes Intravenous Every 24 hours 03/21/23 1635 03/27/23 0959   03/22/23  1000  azithromycin (ZITHROMAX) 500 mg in sodium chloride 0.9 % 250 mL IVPB       Note to Pharmacy: Retime as needed   500 mg 250 mL/hr over 60 Minutes Intravenous Every 24 hours 03/21/23 1635 03/27/23 0959   03/21/23 0615  oseltamivir (TAMIFLU) capsule 75 mg        75 mg Oral 2 times daily 03/21/23 0612 03/26/23 0959   03/21/23 0515  cefTRIAXone (ROCEPHIN) 1 g in sodium chloride 0.9 % 100 mL IVPB        1 g 200 mL/hr over 30 Minutes Intravenous  Once 03/21/23 0505 03/21/23 0621   03/21/23 0515  azithromycin (ZITHROMAX) 500 mg in sodium chloride 0.9 % 250 mL IVPB        500 mg 250 mL/hr over 60 Minutes Intravenous  Once 03/21/23 0505 03/21/23 0722       Subjective: Feels about the same  Notes chronic LLE weakness - feels weak generally   Objective: Vitals:   03/21/23 2043 03/22/23 0102 03/22/23 0423 03/22/23 0726  BP: 117/68 (!) 143/88 103/60 (!) 102/46  Pulse: (!) 119 (!) 118 (!) 110 (!) 112  Resp: (!) 24 (!) 24 20 18   Temp: 98.7 F (37.1 C) 99.2 F (37.3 C) 99.4 F (37.4 C) (!) 101.6 F (38.7 C)  TempSrc: Oral Oral Oral Oral  SpO2: 96% 94% 100% 100%  Weight:      Height:        Intake/Output Summary (Last 24 hours) at 03/22/2023 0840 Last data filed at 03/22/2023 0425 Gross per 24 hour  Intake 120 ml  Output 200 ml  Net -80 ml   Filed Weights   03/21/23 0413  Weight: (!) 170.1 kg    Examination:  General exam: Appears calm and comfortable  Respiratory system: Clear to auscultation. Respiratory effort normal. Cardiovascular system: S1 & S2 heard, RRR. No JVD, murmurs, rubs, gallops or clicks. No pedal edema. Gastrointestinal system: Abdomen is nondistended, soft and nontender. No organomegaly or masses felt. Normal bowel sounds heard. Central nervous system: CN 2-12 intact.  5/5 upper extremity strength.  Intact FNF.  4/5 strength to RLE.  3+/5 to LLE (she notes chronic weakness to this leg).  Some difficulty with heel to shin.   Extremities: no LEE    Data  Reviewed: I have personally reviewed following labs and imaging studies  CBC: Recent Labs  Lab 03/21/23 0425 03/21/23 1721 03/22/23 0410  WBC 8.0 8.4 9.4  NEUTROABS 6.8  --   --   HGB 13.7 13.6 13.4  HCT 40.5 43.4 42.8  MCV 77.9* 83.0 84.3  PLT 238 229 233    Basic Metabolic Panel: Recent Labs  Lab 03/21/23 0425 03/21/23 1721 03/22/23 0410  NA 137  --  138  K 4.0  --  5.2*  CL 102  --  104  CO2 25  --  29  GLUCOSE 161*  --  138*  BUN 18  --  18  CREATININE 0.98 0.81 1.29*  CALCIUM 8.8*  --  8.9    GFR: Estimated Creatinine Clearance: 82.8 mL/min (Delayza Lungren) (by C-G formula based on SCr of 1.29  mg/dL (H)).  Liver Function Tests: Recent Labs  Lab 03/21/23 0425 03/22/23 0410  AST 28 25  ALT 25 25  ALKPHOS 81 72  BILITOT 0.5 0.4  PROT 6.7 6.7  ALBUMIN 2.9* 3.0*    CBG: Recent Labs  Lab 03/21/23 1736 03/21/23 2039 03/22/23 0122 03/22/23 0733 03/22/23 0747  GLUCAP 150* 215* 150* 132* 151*     Recent Results (from the past 240 hours)  Resp panel by RT-PCR (RSV, Flu Jacoba Cherney&B, Covid) Anterior Nasal Swab     Status: Abnormal   Collection Time: 03/21/23  4:25 AM   Specimen: Anterior Nasal Swab  Result Value Ref Range Status   SARS Coronavirus 2 by RT PCR NEGATIVE NEGATIVE Final    Comment: (NOTE) SARS-CoV-2 target nucleic acids are NOT DETECTED.  The SARS-CoV-2 RNA is generally detectable in upper respiratory specimens during the acute phase of infection. The lowest concentration of SARS-CoV-2 viral copies this assay can detect is 138 copies/mL. Seneca Hoback negative result does not preclude SARS-Cov-2 infection and should not be used as the sole basis for treatment or other patient management decisions. Luvena Wentling negative result may occur with  improper specimen collection/handling, submission of specimen other than nasopharyngeal swab, presence of viral mutation(s) within the areas targeted by this assay, and inadequate number of viral copies(<138 copies/mL). Nikola Blackston negative result  must be combined with clinical observations, patient history, and epidemiological information. The expected result is Negative.  Fact Sheet for Patients:  BloggerCourse.com  Fact Sheet for Healthcare Providers:  SeriousBroker.it  This test is no t yet approved or cleared by the [place] FDA and  has been authorized for detection and/or diagnosis of SARS-CoV-2 by FDA under an Emergency Use Authorization (EUA). This EUA will remain  in effect (meaning this test can be used) for the duration of the COVID-19 declaration under Section 564(b)(1) of the Act, 21 U.S.C.section 360bbb-3(b)(1), unless the authorization is terminated  or revoked sooner.       Influenza Rochell Mabie by PCR POSITIVE (Ozell Juhasz) NEGATIVE Final   Influenza B by PCR NEGATIVE NEGATIVE Final    Comment: (NOTE) The Xpert Xpress SARS-CoV-2/FLU/RSV plus assay is intended as an aid in the diagnosis of influenza from Nasopharyngeal swab specimens and should not be used as Keyden Pavlov sole basis for treatment. Nasal washings and aspirates are unacceptable for Xpert Xpress SARS-CoV-2/FLU/RSV testing.  Fact Sheet for Patients: BloggerCourse.com  Fact Sheet for Healthcare Providers: SeriousBroker.it  This test is not yet approved or cleared by the [place] FDA and has been authorized for detection and/or diagnosis of SARS-CoV-2 by FDA under an Emergency Use Authorization (EUA). This EUA will remain in effect (meaning this test can be used) for the duration of the COVID-19 declaration under Section 564(b)(1) of the Act, 21 U.S.C. section 360bbb-3(b)(1), unless the authorization is terminated or revoked.     Resp Syncytial Virus by PCR NEGATIVE NEGATIVE Final    Comment: (NOTE) Fact Sheet for Patients: BloggerCourse.com  Fact Sheet for Healthcare Providers: SeriousBroker.it  This test  is not yet approved or cleared by the [place] FDA and has been authorized for detection and/or diagnosis of SARS-CoV-2 by FDA under an Emergency Use Authorization (EUA). This EUA will remain in effect (meaning this test can be used) for the duration of the COVID-19 declaration under Section 564(b)(1) of the Act, 21 U.S.C. section 360bbb-3(b)(1), unless the authorization is terminated or revoked.  Performed at [facility], [address], [place], [place] [address]  Radiology Studies: CT Angio Chest PE W and/or Wo Contrast Result Date: 03/21/2023 CLINICAL DATA:  Evaluate for pulmonary embolism. Complains of cough, shortness of breath, nasal congestion. EXAM: CT ANGIOGRAPHY CHEST WITH CONTRAST TECHNIQUE: Multidetector CT imaging of the chest was performed using the standard protocol during bolus administration of intravenous contrast. Multiplanar CT image reconstructions and MIPs were obtained to evaluate the vascular anatomy. RADIATION DOSE REDUCTION: This exam was performed according to the departmental dose-optimization program which includes automated exposure control, adjustment of the mA and/or kV according to patient size and/or use of iterative reconstruction technique. CONTRAST:  75mL OMNIPAQUE IOHEXOL 350 MG/ML SOLN COMPARISON:  Chest radiograph from earlier today. CT chest 06/02/2017 FINDINGS: Cardiovascular: Motion artifact diminishes exam detail within the upper and lower lung zones. Within this limitation, there is no sign of acute pulmonary embolism to the level of the distal lobar pulmonary arteries. Previous median sternotomy. Heart size is normal. No pericardial effusion. Aortic atherosclerosis. Left circumflex coronary artery calcification noted. Mediastinum/Nodes: No mediastinal or hilar adenopathy. Thyroid gland, trachea and esophagus are unremarkable. Lungs/Pleura: Small to moderate right pleural effusion. Focal area of loculated fluid along the  anterior aspect of the minor fissure noted. Mild consolidative change within the posterior right base noted. Areas of subsegmental atelectasis identified in the posterior and anterior segments of the right lower lobe. Upper Abdomen: No acute abnormality. Musculoskeletal: No chest wall abnormality. No acute or significant osseous findings. Review of the MIP images confirms the above findings. IMPRESSION: 1. Motion artifact diminishes exam detail within the upper and lower lung zones. Within this limitation, there is no sign of acute pulmonary embolism to the level of the distal lobar pulmonary arteries. 2. Small to moderate right pleural effusion. Focal area of loculated fluid along the anterior aspect of the minor fissure noted. 3. Mild consolidative change within the posterior right base noted. Areas of subsegmental atelectasis identified in the posterior and anterior segments of the right lower lobe. 4. Coronary artery calcifications noted. 5.  Aortic Atherosclerosis (ICD10-I70.0). 1. Electronically Signed   By: Signa Kell M.D.   On: 03/21/2023 05:57   DG Chest Portable 1 View Result Date: 03/21/2023 CLINICAL DATA:  Cough, shortness of breath, headaches and sore throat, nasal congestion. There is Reyce Lubeck history of thymoma with pleural metastasis and XRT. EXAM: PORTABLE CHEST 1 VIEW COMPARISON:  Chest CT with contrast 02/28/2023 FINDINGS: Airspace disease intermixed with linear atelectasis or scarring is again noted in the right lung base, but was seen previously and could be XRT related. Underlying pneumonia would be difficult to exclude. Sary Bogie small layering right pleural effusion appears similar. The remaining hypoexpanded lungs are generally clear. There is mild cardiomegaly, sternotomy sutures. Stable mediastinal configuration. No vascular congestion is seen.  No new osseous findings. IMPRESSION: 1. Airspace disease intermixed with linear atelectasis or scarring in the right lung base, but was seen previously  and could be XRT related. Underlying pneumonia would be difficult to exclude. No new abnormality. 2. Small layering right pleural effusion appears similar. 3. Mild cardiomegaly. Electronically Signed   By: Almira Bar M.D.   On: 03/21/2023 04:57        Scheduled Meds:  ascorbic acid  1,500 mg Oral Daily   aspirin EC  81 mg Oral Daily   calcium-vitamin D  2 tablet Oral Q breakfast   carvedilol  6.25 mg Oral BID   cholecalciferol  5,000 Units Oral Daily   enoxaparin (LOVENOX) injection  80 mg Subcutaneous QHS   ferrous sulfate  325 mg Oral QODAY   fluticasone  1 spray Each Nare Daily   insulin pump   Subcutaneous TID WC, HS, 0200   ipratropium-albuterol  3 mL Nebulization Q6H WA   loratadine  10 mg Oral Daily   magnesium oxide  400 mg Oral Daily   oseltamivir  75 mg Oral BID   predniSONE  40 mg Oral Q breakfast   rosuvastatin  40 mg Oral Daily   Continuous Infusions:  azithromycin     cefTRIAXone (ROCEPHIN)  IV       LOS: 1 day    Time spent: over 30 min  40 min critical care with sepsis due to influenza Kimetha Trulson, bacterial pneumonia  Lacretia Nicks, MD [facility]   To contact the attending provider between 7A-7P or the covering provider during after hours 7P-7A, please log into the web site [email or web address] and access using universal [place] password for that web site. If you do not have the password, please call the hospital operator.  03/22/2023, 8:40 AM

## 2023-03-23 ENCOUNTER — Inpatient Hospital Stay (HOSPITAL_COMMUNITY)

## 2023-03-23 DIAGNOSIS — J9601 Acute respiratory failure with hypoxia: Secondary | ICD-10-CM | POA: Diagnosis not present

## 2023-03-23 LAB — GLUCOSE, CAPILLARY
Glucose-Capillary: 105 mg/dL — ABNORMAL HIGH (ref 70–99)
Glucose-Capillary: 99 mg/dL (ref 70–99)
Glucose-Capillary: 142 mg/dL — ABNORMAL HIGH (ref 70–99)
Glucose-Capillary: 162 mg/dL — ABNORMAL HIGH (ref 70–99)
Glucose-Capillary: 158 mg/dL — ABNORMAL HIGH (ref 70–99)
Glucose-Capillary: 211 mg/dL — ABNORMAL HIGH (ref 70–99)
Glucose-Capillary: 236 mg/dL — ABNORMAL HIGH (ref 70–99)

## 2023-03-23 LAB — CBC WITH DIFFERENTIAL/PLATELET
Abs Immature Granulocytes: 0.03 10*3/uL (ref 0.00–0.07)
Basophils Absolute: 0 10*3/uL (ref 0.0–0.1)
Basophils Relative: 0 %
Eosinophils Absolute: 0 10*3/uL (ref 0.0–0.5)
Eosinophils Relative: 0 %
HCT: 41.5 % (ref 36.0–46.0)
Hemoglobin: 13.2 g/dL (ref 12.0–15.0)
Immature Granulocytes: 0 %
Lymphocytes Relative: 14 %
Lymphs Abs: 1 10*3/uL (ref 0.7–4.0)
MCH: 26.5 pg (ref 26.0–34.0)
MCHC: 31.8 g/dL (ref 30.0–36.0)
MCV: 83.3 fL (ref 80.0–100.0)
Monocytes Absolute: 1 10*3/uL (ref 0.1–1.0)
Monocytes Relative: 14 %
Neutro Abs: 5.2 10*3/uL (ref 1.7–7.7)
Neutrophils Relative %: 72 %
Platelets: 236 10*3/uL (ref 150–400)
RBC: 4.98 MIL/uL (ref 3.87–5.11)
RDW: 15.8 % — ABNORMAL HIGH (ref 11.5–15.5)
WBC: 7.2 10*3/uL (ref 4.0–10.5)
nRBC: 0 % (ref 0.0–0.2)

## 2023-03-23 LAB — COMPREHENSIVE METABOLIC PANEL
ALT: 27 U/L (ref 0–44)
AST: 25 U/L (ref 15–41)
Albumin: 2.9 g/dL — ABNORMAL LOW (ref 3.5–5.0)
Alkaline Phosphatase: 72 U/L (ref 38–126)
Anion gap: 7 (ref 5–15)
BUN: 32 mg/dL — ABNORMAL HIGH (ref 6–20)
CO2: 30 mmol/L (ref 22–32)
Calcium: 9.1 mg/dL (ref 8.9–10.3)
Chloride: 102 mmol/L (ref 98–111)
Creatinine, Ser: 1.24 mg/dL — ABNORMAL HIGH (ref 0.44–1.00)
GFR, Estimated: 51 mL/min — ABNORMAL LOW (ref >60)
Glucose, Bld: 108 mg/dL — ABNORMAL HIGH (ref 70–99)
Potassium: 5.2 mmol/L — ABNORMAL HIGH (ref 3.5–5.1)
Sodium: 139 mmol/L (ref 135–145)
Total Bilirubin: 0.6 mg/dL (ref 0.0–1.2)
Total Protein: 6.3 g/dL — ABNORMAL LOW (ref 6.5–8.1)

## 2023-03-23 LAB — MAGNESIUM: Magnesium: 2.5 mg/dL — ABNORMAL HIGH (ref 1.7–2.4)

## 2023-03-23 LAB — LEGIONELLA PNEUMOPHILA SEROGP 1 UR AG: L. pneumophila Serogp 1 Ur Ag: NEGATIVE

## 2023-03-23 LAB — PHOSPHORUS: Phosphorus: 4.1 mg/dL (ref 2.5–4.6)

## 2023-03-23 MED ORDER — AZITHROMYCIN 250 MG PO TABS
500.0000 mg | ORAL_TABLET | Freq: Every day | ORAL | Status: AC
  Administered 2023-03-24 – 2023-03-25 (×2): 500 mg via ORAL
  Filled 2023-03-23 (×2): qty 2

## 2023-03-23 MED ORDER — SODIUM ZIRCONIUM CYCLOSILICATE 10 G PO PACK
10.0000 g | PACK | Freq: Once | ORAL | Status: AC
  Administered 2023-03-23: 10 g via ORAL
  Filled 2023-03-23: qty 1

## 2023-03-23 MED ORDER — BUTALBITAL-APAP-CAFFEINE 50-325-40 MG PO TABS
1.0000 | ORAL_TABLET | ORAL | Status: DC | PRN
  Administered 2023-03-23 – 2023-03-24 (×3): 1 via ORAL
  Filled 2023-03-23 (×3): qty 1

## 2023-03-23 MED ORDER — IPRATROPIUM-ALBUTEROL 0.5-2.5 (3) MG/3ML IN SOLN
3.0000 mL | Freq: Four times a day (QID) | RESPIRATORY_TRACT | Status: DC
  Administered 2023-03-24 (×4): 3 mL via RESPIRATORY_TRACT
  Filled 2023-03-23 (×4): qty 3

## 2023-03-23 MED ORDER — VANCOMYCIN HCL IN DEXTROSE 1-5 GM/200ML-% IV SOLN
1000.0000 mg | Freq: Two times a day (BID) | INTRAVENOUS | Status: DC
  Administered 2023-03-24: 1000 mg via INTRAVENOUS
  Filled 2023-03-23: qty 200

## 2023-03-23 MED ORDER — VANCOMYCIN HCL 2000 MG/400ML IV SOLN
2000.0000 mg | Freq: Once | INTRAVENOUS | Status: AC
  Administered 2023-03-23: 2000 mg via INTRAVENOUS
  Filled 2023-03-23: qty 400

## 2023-03-23 MED ORDER — SODIUM CHLORIDE 0.9 % IV SOLN
2.0000 g | Freq: Three times a day (TID) | INTRAVENOUS | Status: DC
  Administered 2023-03-23 – 2023-03-25 (×5): 2 g via INTRAVENOUS
  Filled 2023-03-23 (×5): qty 12.5

## 2023-03-23 NOTE — Plan of Care (Signed)
 Patient still gets SOB with ambulation in the room. But feels like she is doing a little better than yesterday.  Labs and vitals remained stable.

## 2023-03-23 NOTE — Consult Note (Addendum)
 Cardiology Consultation   Patient ID: KEMBA HOPPES MRN: [id number]; DOB: [birth year]  Admit date: 03/21/2023 Date of Consult: 03/23/2023  PCP:  Judd Lien, PA-C   [facility] Providers Cardiologist:  None        Patient Profile:   Kaitlin [name] is a 58 y.o. 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 Lowell Guitar.  History of Present Illness:   Ms. Kaitlin [name] was first evaluated by cardiology in 2015 following thymoma resection when she was noted to have a right sided pleural effusion and echocardiogram with dilated cardiomyopathy, LVEF 25%. LHC was completed and found normal coronary anatomy. A follow up echo later in 2015 showed LVEF 45-50%. Patient has been lost to cardiology follow up with [facility] since 2018 but has seen Dr. Alvino Chapel with [facility] cardiology (2023).   Patient presented to [facility] ED on 3/10 with symptoms of dyspnea. This followed a week of URI symptoms. Including headache, congestion, sore throat. In the ED, labs notable for positive Influenza A test, albumin 2.9, BNP 94.0, troponin 22->20. Patient was admitted for further management of influenza and dyspnea. An echocardiogram was completed and this noted LVEF 35-40%, down from 45-50% on prior echo from 2021. This decrease prompted consult to cardiology.   Today patient appears uncomfortably, reports severe headache. She continues with non-productive cough and sore throat. We discussed her symptoms prior to admission and she denies weight gain, orthopnea, exertional dyspnea, fluid retention. She was apparently taking her Torsemide differently than listed in EMR, every other day dosing. Admits to not following up with her [facility] cardiology team since 2023, cites lack of symptoms as the reason.    Past Medical  History:  Diagnosis Date   Anemia    Bronchitis    Cataracts, bilateral 01/12/2018   CHF (congestive heart failure) (HCC)    Diabetes mellitus    High cholesterol    Hypertension    takes medicine to protect kidneys, does not have HTN   Leukocytosis    Morbid obesity (HCC) 09/11/2011   BMI 57   Neuropathy 01/12/2018   NICM (nonischemic cardiomyopathy) (HCC) 05/01/2013   Overview:  Last Assessment & Plan:  euvolemic on physical exam.    Obesity    Sickle cell trait (HCC)    Thymoma     Past Surgical History:  Procedure Laterality Date   COLONOSCOPY N/A 06/21/2017   Procedure: COLONOSCOPY;  Surgeon: Kerin Salen, MD;  Location: [facility] ENDOSCOPY;  Service: Gastroenterology;  Laterality: N/A;   LEFT HEART CATHETERIZATION WITH CORONARY ANGIOGRAM N/A 05/25/2013   Procedure: LEFT HEART CATHETERIZATION WITH CORONARY ANGIOGRAM;  Surgeon: Lennette Bihari, MD;  Location: [facility] CATH LAB;  Service: Cardiovascular;  Laterality: N/A;   LYMPH NODE BIOPSY Right 11/30/2012   Procedure: RIGHT TONSIL BIOPSY WITH FRESH FROZEN ANALYSIS;  Surgeon: Flo Shanks, MD;  Location: [facility] OR;  Service: ENT;  Laterality: Right;   MEDIASTERNOTOMY N/A 01/12/2013   Procedure: MEDIAN STERNOTOMY;  Surgeon: Alleen Borne, MD;  Location: [facility] OR;  Service: Thoracic;  Laterality: N/A;   MEDIASTINOTOMY CHAMBERLAIN MCNEIL    Right 11/06/2012   Procedure: MEDIASTINOTOMY CHAMBERLAIN MCNEIL PROCEDURE;  Surgeon: Alleen Borne, MD;  Location: [facility] OR;  Service: Thoracic;  Laterality: Right;   POLYPECTOMY  06/21/2017   Procedure: POLYPECTOMY;  Surgeon: Kerin Salen, MD;  Location: [facility] ENDOSCOPY;  Service: Gastroenterology;;   RESECTION OF A THYMOMA N/A 01/12/2013   Procedure: RESECTION OF A THYMOMA;  Surgeon: Alleen Borne, MD;  Location: [facility] OR;  Service: Thoracic;  Laterality: N/A;   TONSILLECTOMY     TUBAL LIGATION       Home Medications:  Prior to Admission medications   Medication Sig Start Date End Date Taking? Authorizing Provider   acetaminophen (TYLENOL) 325 MG tablet Take 2 tablets (650 mg total) by mouth every 6 (six) hours as needed for moderate pain (pain score 4-6). 01/02/23  Yes Wallis Bamberg, PA-C  albuterol (VENTOLIN HFA) 108 (90 Base) MCG/ACT inhaler One puff every 6 hour as needed Patient taking differently: Inhale 2 puffs into the lungs every 6 (six) hours as needed for wheezing or shortness of breath. 03/09/19  Yes Angiulli, Mcarthur Rossetti, PA-C  ascorbic acid (VITAMIN C) 500 MG tablet Take 500 mg by mouth daily.   Yes [provider]  aspirin EC 81 MG tablet Take 1 tablet (81 mg total) by mouth daily. 05/18/16  Yes Rollene Rotunda, MD  benzonatate (TESSALON) 200 MG capsule Take 200 mg by mouth 3 (three) times daily as needed for cough.   Yes [provider]  carvedilol (COREG) 12.5 MG tablet Take 12.5 mg by mouth 2 (two) times daily. 10/11/21  Yes [provider]  Cholecalciferol (VITAMIN D-3) 125 MCG (5000 UT) TABS Take 5,000 Units by mouth daily. 03/09/19  Yes Angiulli, Mcarthur Rossetti, PA-C  Continuous Glucose Sensor (DEXCOM G7 SENSOR) MISC Inject 1 Device into the skin See admin instructions. Place 1 sensor into the skin every 10 days   Yes [provider]  Cyanocobalamin 2000 MCG TBCR Take 2,000 mcg by mouth daily.   Yes [provider]  diclofenac (VOLTAREN) 50 MG EC tablet TAKE 1 TABLET(50 MG) BY MOUTH DAILY FOR ARTHRITIS Patient taking differently: Take 50 mg by mouth in the morning. 03/17/23  Yes Lovorn, Megan, MD  FEROSUL 325 (65 Fe) MG tablet TAKE 1 TABLET(325 MG) BY MOUTH DAILY WITH BREAKFAST 09/20/22  Yes Lovorn, Aundra Millet, MD  fexofenadine (ALLEGRA) 180 MG tablet Take 180 mg by mouth at bedtime.   Yes [provider]  fluticasone (FLONASE) 50 MCG/ACT nasal spray Place 1 spray into both nostrils daily. 10/03/22  Yes Radford Pax, NP  HUMALOG 100 UNIT/ML injection Inject into the skin See admin instructions. Per pump- replenish every 2 days   Yes [provider]   Insulin Disposable Pump (OMNIPOD 5 G7 PODS, GEN 5,) MISC Inject 1 Device into the skin every other day.   Yes [provider]  lisinopril (ZESTRIL) 5 MG tablet Take 5 mg by mouth daily.   Yes [provider]  magnesium oxide (MAG-OX) 400 (241.3 Mg) MG tablet Take 1 tablet (400 mg total) by mouth 2 (two) times daily. Patient taking differently: Take 400 mg by mouth daily. 03/09/19  Yes Angiulli, Mcarthur Rossetti, PA-C  Multiple Vitamins-Minerals (CENTRUM SILVER 50+WOMEN) TABS Take 1 tablet by mouth daily with breakfast.   Yes [provider]  potassium chloride SA (KLOR-CON) 20 MEQ tablet Take 20 mEq by mouth daily.   Yes [provider]  rosuvastatin (CRESTOR) 40 MG tablet Take 1 tablet (40 mg total) by mouth daily. 03/09/19  Yes Angiulli, Mcarthur Rossetti, PA-C  torsemide (DEMADEX) 20 MG tablet Take 1 tablet (20 mg total) by mouth 2 (two) times daily. Patient taking differently: Take 20 mg by mouth every other day. 03/09/19  Yes Angiulli, Mcarthur Rossetti, PA-C  Zinc 50 MG TABS Take 50 mg by mouth daily.   Yes [provider]  calcium-vitamin D (OSCAL WITH D) 500-200 MG-UNIT tablet Take 2 tablets by mouth daily with breakfast. Patient not taking: Reported on 03/21/2023 03/09/19   Angiulli, Mcarthur Rossetti, PA-C    Inpatient Medications: Scheduled Meds:  ascorbic acid  1,500 mg Oral Daily   aspirin EC  81 mg Oral Daily   calcium-vitamin D  2 tablet Oral Q breakfast   cholecalciferol  5,000 Units Oral Daily   enoxaparin (LOVENOX) injection  80 mg Subcutaneous QHS   ferrous sulfate  325 mg Oral QODAY   fluticasone  1 spray Each Nare Daily   insulin pump   Subcutaneous TID WC, HS, 0200   ipratropium-albuterol  3 mL Nebulization TID   loratadine  10 mg Oral Daily   magnesium oxide  400 mg Oral Daily   oseltamivir  75 mg Oral BID   predniSONE  40 mg Oral Q breakfast   rosuvastatin  40 mg Oral Daily   Continuous Infusions:  azithromycin Stopped (03/22/23 1235)   cefTRIAXone  (ROCEPHIN)  IV Stopped (03/22/23 1331)   PRN Meds: acetaminophen **OR** acetaminophen, albuterol, guaiFENesin, polyethylene glycol  Allergies:    Allergies  Allergen Reactions   Amoxicillin-Pot Clavulanate Diarrhea   Ibuprofen Other (See Comments)    Per MD she should no longer take, unsure why   Amoxicillin Diarrhea    Social History:   Social History   Socioeconomic History   Marital status: Widowed    Spouse name: Not on file   Number of children: 3   Years of education: 14   Highest education level: Not on file  Occupational History   Occupation: Unemployed  Tobacco Use   Smoking status: Never    Passive exposure: Yes   Smokeless tobacco: Never  Vaping Use   Vaping status: Never Used  Substance and Sexual Activity   Alcohol use: No   Drug use: No   Sexual activity: Yes    Birth control/protection: Surgical  Other Topics Concern   Not on file  Social History Narrative   Lives at home with mother, husband and son   Caffeine use: none   Social Drivers of Health   Financial Resource Strain: Medium Risk (08/18/2021)   [facility] prior to 03/13/2022[facility] visits prior to 03/13/2022.   Overall Financial Resource Strain (CARDIA)    Difficulty of Paying Living Expenses: Somewhat hard  Food Insecurity: No Food Insecurity (03/21/2023)   Hunger Vital Sign    Worried About Running Out of Food in the Last Year: Never true    Ran Out of Food in the Last Year: Never true  Transportation Needs: No Transportation Needs (03/21/2023)   PRAPARE - Administrator, Civil Service (Medical): No    Lack of Transportation (Non-Medical): No  Physical Activity: Insufficiently Active (08/18/2021)   Received from [facility] visits prior to 03/13/2022., [facility] prior to 03/13/2022.   Exercise Vital Sign     Days of Exercise per Week: 1 day    Minutes of Exercise per Session: 10 min  Stress: No Stress Concern Present (08/18/2021)   [facility] from [facility], [facility] visits prior to 03/13/2022., [facility], [facility] visits prior to 03/13/2022.  [employer] of Occupational Health - Occupational Stress Questionnaire    Feeling of Stress : Only a little  Social Connections: Moderately Isolated (03/21/2023)   Social Connection and Isolation Panel [NHANES]    Frequency of Communication with Friends and Family: More than three times a week    Frequency of Social Gatherings with Friends and Family: Twice a week    Attends Religious Services: More than 4 times per year    Active Member of [employer] or Organizations: No    Attends Banker Meetings: Never    Marital Status: Widowed  Intimate Partner Violence: Not At Risk (03/21/2023)   Humiliation, Afraid, Rape, and Kick questionnaire    Fear of Current or Ex-Partner: No    Emotionally Abused: No    Physically Abused: No    Sexually Abused: No    Family History:    Family History  Problem Relation Age of Onset   Diabetes Mother    Heart disease Father        No details   Diabetes Maternal Aunt    Diabetes Maternal Uncle    Diabetes Maternal Grandmother    Glaucoma Maternal Grandmother    Breast cancer Maternal Grandmother    Diabetes Maternal Grandfather    Diabetes type II Other        Family HX   Breast cancer Other      ROS:  Please see the history of present illness.   All other ROS reviewed and negative.     Physical Exam/Data:   Vitals:   03/22/23 2118 03/22/23 2159 03/23/23 0238 03/23/23 0327  BP:  (!) 117/50 (!) 142/118 132/62  Pulse:  95 (!) 103 (!) 102  Resp:  16 20 20   Temp:  98.7 F (37.1 C) 98.8 F (37.1 C) 99.1 F (37.3 C)  TempSrc:  Oral Oral Oral  SpO2: 100% 96% 100% 98%  Weight:      Height:        Intake/Output Summary (Last 24 hours)  at 03/23/2023 0900 Last data filed at 03/23/2023 0700 Gross per 24 hour  Intake 950 ml  Output 600 ml  Net 350 ml      03/21/2023    4:13 AM 08/09/2022   10:29 AM 06/28/2022   11:20 AM  Last 3 Weights  Weight (lbs) 375 lb 378 lb 381 lb  Weight (kg) 170.099 kg 171.46 kg 172.82 kg     Body mass index is 57.02 kg/m.  General:  Ill appearing obese female. No acute distress. HEENT: normal Neck: no JVD Vascular: No carotid bruits; Distal pulses 2+ bilaterally Cardiac:  normal S1, S2; slightly tachycardic Lungs:  poor inspiratory effort, no rales appreciated Abd: soft, nontender, no hepatomegaly  Ext: no edema Musculoskeletal:  No deformities, BUE and BLE strength normal and equal Skin: warm and dry  Neuro:  CNs 2-12 intact, no focal abnormalities noted Psych:  Normal affect   EKG:  The EKG was personally reviewed and demonstrates:  sinus tachycardia with HR 111. No acute ischemic changes.  Telemetry:  Telemetry was personally reviewed and demonstrates:  sinus rhythm. Rates into the low 100s.   Relevant CV Studies: 03/22/23 TTE  IMPRESSIONS     1. Left ventricular ejection fraction, by estimation, is 35 to 40%. The  left ventricle has moderately decreased function. Left ventricular  endocardial border not optimally defined to evaluate regional wall motion.  Left ventricular diastolic parameters  are consistent with Grade I diastolic dysfunction (impaired relaxation).  2. Right ventricular systolic function is normal. The right ventricular  size is normal.   3. The mitral valve was not well visualized. Trivial mitral valve  regurgitation. No evidence of mitral stenosis.   4. The aortic valve is tricuspid. Aortic valve regurgitation is not  visualized. No aortic stenosis is present.   Conclusion(s)/Recommendation(s): Technically very limit study due to poor  sound wave transmission.   FINDINGS   Left Ventricle: Left ventricular ejection fraction, by estimation, is 35  to  40%. The left ventricle has moderately decreased function. Left  ventricular endocardial border not optimally defined to evaluate regional  wall motion. The left ventricular  internal cavity size was normal in size. There is no left ventricular  hypertrophy. Left ventricular diastolic parameters are consistent with  Grade I diastolic dysfunction (impaired relaxation).   Right Ventricle: The right ventricular size is normal. No increase in  right ventricular wall thickness. Right ventricular systolic function is  normal.   Left Atrium: Left atrial size was normal in size.   Right Atrium: Right atrial size was normal in size.   Pericardium: There is no evidence of pericardial effusion.   Mitral Valve: The mitral valve was not well visualized. Trivial mitral  valve regurgitation. No evidence of mitral valve stenosis. MV peak  gradient, 3.0 mmHg. The mean mitral valve gradient is 2.0 mmHg.   Tricuspid Valve: The tricuspid valve is not well visualized. Tricuspid  valve regurgitation is trivial. No evidence of tricuspid stenosis.   Aortic Valve: The aortic valve is tricuspid. Aortic valve regurgitation is  not visualized. No aortic stenosis is present. Aortic valve mean gradient  measures 5.0 mmHg. Aortic valve peak gradient measures 9.5 mmHg. Aortic  valve area, by VTI measures 1.07  cm.   Pulmonic Valve: The pulmonic valve was normal in structure. Pulmonic valve  regurgitation is not visualized. No evidence of pulmonic stenosis.   Aorta: The aortic root is normal in size and structure.   Venous: The inferior vena cava was not well visualized.   IAS/Shunts: No atrial level shunt detected by color flow Doppler.   Laboratory Data:  High Sensitivity Troponin:   Recent Labs  Lab 03/21/23 0425 03/21/23 0626  TROPONINIHS 22* 20*     Chemistry Recent Labs  Lab 03/21/23 0425 03/21/23 1721 03/22/23 0410 03/23/23 0437  NA 137  --  138 139  K 4.0  --  5.2* 5.2*  CL 102  --   104 102  CO2 25  --  29 30  GLUCOSE 161*  --  138* 108*  BUN 18  --  18 32*  CREATININE 0.98 0.81 1.29* 1.24*  CALCIUM 8.8*  --  8.9 9.1  MG  --   --   --  2.5*  GFRNONAA >60 >60 49* 51*  ANIONGAP 10  --  5 7    Recent Labs  Lab 03/21/23 0425 03/22/23 0410 03/23/23 0437  PROT 6.7 6.7 6.3*  ALBUMIN 2.9* 3.0* 2.9*  AST 28 25 25   ALT 25 25 27   ALKPHOS 81 72 72  BILITOT 0.5 0.4 0.6   Lipids No results for input(s): "CHOL", "TRIG", "HDL", "LABVLDL", "LDLCALC", "CHOLHDL" in the last 168 hours.  Hematology Recent Labs  Lab 03/21/23 1721 03/22/23 0410 03/23/23 0437  WBC 8.4 9.4 7.2  RBC 5.23* 5.08 4.98  HGB 13.6 13.4 13.2  HCT 43.4 42.8 41.5  MCV 83.0 84.3 83.3  MCH 26.0 26.4 26.5  MCHC 31.3 31.3 31.8  RDW 15.8* 15.9* 15.8*  PLT 229 233 236   Thyroid No results for input(s): "TSH", "FREET4" in the last 168 hours.  BNP Recent Labs  Lab 03/21/23 0425  BNP 94.0    DDimer No results for input(s): "DDIMER" in the last 168 hours.   Radiology/Studies:  ECHOCARDIOGRAM COMPLETE Result Date: 03/22/2023    ECHOCARDIOGRAM REPORT   Patient Name:   DWANNA GOSHERT Date of Exam: 03/22/2023 Medical Rec #:  [id number]       Height:       68.0 in Accession #:    [id number]      Weight:       375.0 lb Date of Birth:  12/07/1965        BSA:          2.670 m Patient Age:    57 years        BP:           97/50 mmHg Patient Gender: F               HR:           97 bpm. Exam Location:  Inpatient Procedure: 2D Echo, Cardiac Doppler, Color Doppler and Intracardiac            Opacification Agent (Both Spectral and Color Flow Doppler were            utilized during procedure). Indications:    Other abnormalities of the heart  History:        Patient has prior history of Echocardiogram examinations, most                 recent 01/16/2019. Signs/Symptoms:Dyspnea; Risk                 Factors:Hypertension, Diabetes and Dyslipidemia.  Sonographer:    Vern Claude Referring Phys: [phone] A CALDWELL POWELL JR   Sonographer Comments: Image acquisition challenging due to patient body habitus. IMPRESSIONS  1. Left ventricular ejection fraction, by estimation, is 35 to 40%. The left ventricle has moderately decreased function. Left ventricular endocardial border not optimally defined to evaluate regional wall motion. Left ventricular diastolic parameters are consistent with Grade I diastolic dysfunction (impaired relaxation).  2. Right ventricular systolic function is normal. The right ventricular size is normal.  3. The mitral valve was not well visualized. Trivial mitral valve regurgitation. No evidence of mitral stenosis.  4. The aortic valve is tricuspid. Aortic valve regurgitation is not visualized. No aortic stenosis is present. Conclusion(s)/Recommendation(s): Technically very limit study due to poor sound wave transmission. FINDINGS  Left Ventricle: Left ventricular ejection fraction, by estimation, is 35 to 40%. The left ventricle has moderately decreased function. Left ventricular endocardial border not optimally defined to evaluate regional wall motion. The left ventricular internal cavity size was normal in size. There is no left ventricular hypertrophy. Left ventricular diastolic parameters are consistent with Grade I diastolic dysfunction (impaired relaxation). Right Ventricle: The right ventricular size is normal. No increase in right ventricular wall thickness. Right ventricular systolic function is normal. Left Atrium: Left atrial size was normal in size. Right Atrium: Right atrial size was normal in size. Pericardium: There is no evidence of pericardial effusion. Mitral Valve: The mitral valve was not well visualized. Trivial mitral valve regurgitation. No evidence of mitral valve stenosis. MV peak gradient, 3.0 mmHg. The mean mitral valve gradient is 2.0 mmHg. Tricuspid Valve: The tricuspid valve is not well visualized. Tricuspid valve regurgitation is trivial. No evidence of tricuspid stenosis. Aortic  Valve: The  aortic valve is tricuspid. Aortic valve regurgitation is not visualized. No aortic stenosis is present. Aortic valve mean gradient measures 5.0 mmHg. Aortic valve peak gradient measures 9.5 mmHg. Aortic valve area, by VTI measures 1.07 cm. Pulmonic Valve: The pulmonic valve was normal in structure. Pulmonic valve regurgitation is not visualized. No evidence of pulmonic stenosis. Aorta: The aortic root is normal in size and structure. Venous: The inferior vena cava was not well visualized. IAS/Shunts: No atrial level shunt detected by color flow Doppler.  LEFT VENTRICLE PLAX 2D LVIDd:         4.70 cm      Diastology LVIDs:         3.60 cm      LV e' medial:    5.87 cm/s LV PW:         0.90 cm      LV E/e' medial:  9.0 LV IVS:        0.70 cm      LV e' lateral:   4.35 cm/s LVOT diam:     2.00 cm      LV E/e' lateral: 12.1 LV SV:         24 LV SV Index:   9 LVOT Area:     3.14 cm  LV Volumes (MOD) LV vol d, MOD A2C: 98.9 ml LV vol d, MOD A4C: 244.5 ml LV vol s, MOD A2C: 48.9 ml LV vol s, MOD A4C: 130.0 ml LV SV MOD A2C:     50.0 ml LV SV MOD A4C:     244.5 ml LV SV MOD BP:      82.0 ml RIGHT VENTRICLE             IVC RV Basal diam:  3.70 cm     IVC diam: 1.80 cm RV S prime:     11.00 cm/s LEFT ATRIUM             Index        RIGHT ATRIUM           Index LA diam:        3.70 cm 1.39 cm/m   RA Area:     13.00 cm LA Vol (A2C):   38.5 ml 14.42 ml/m  RA Volume:   30.00 ml  11.24 ml/m LA Vol (A4C):   63.9 ml 23.94 ml/m LA Biplane Vol: 53.5 ml 20.04 ml/m  AORTIC VALVE                    PULMONIC VALVE AV Area (Vmax):    0.89 cm     PV Vmax:       1.33 m/s AV Area (Vmean):   0.99 cm     PV Peak grad:  7.1 mmHg AV Area (VTI):     1.07 cm AV Vmax:           154.00 cm/s AV Vmean:          98.500 cm/s AV VTI:            0.224 m AV Peak Grad:      9.5 mmHg AV Mean Grad:      5.0 mmHg LVOT Vmax:         43.70 cm/s LVOT Vmean:        31.100 cm/s LVOT VTI:          0.076 m LVOT/AV VTI ratio: 0.34  AORTA Ao Root  diam: 3.00 cm Ao Asc diam:  2.60  cm MITRAL VALVE MV Area (PHT): 4.31 cm    SHUNTS MV Area VTI:   1.31 cm    Systemic VTI:  0.08 m MV Peak grad:  3.0 mmHg    Systemic Diam: 2.00 cm MV Mean grad:  2.0 mmHg MV Vmax:       0.86 m/s MV Vmean:      72.5 cm/s MV Decel Time: 176 msec MV E velocity: 52.70 cm/s MV A velocity: 46.30 cm/s MV E/A ratio:  1.14 Arvilla Meres MD Electronically signed by Arvilla Meres MD Signature Date/Time: 03/22/2023/10:25:44 AM    Final    CT Angio Chest PE W and/or Wo Contrast Result Date: 03/21/2023 CLINICAL DATA:  Evaluate for pulmonary embolism. Complains of cough, shortness of breath, nasal congestion. EXAM: CT ANGIOGRAPHY CHEST WITH CONTRAST TECHNIQUE: Multidetector CT imaging of the chest was performed using the standard protocol during bolus administration of intravenous contrast. Multiplanar CT image reconstructions and MIPs were obtained to evaluate the vascular anatomy. RADIATION DOSE REDUCTION: This exam was performed according to the departmental dose-optimization program which includes automated exposure control, adjustment of the mA and/or kV according to patient size and/or use of iterative reconstruction technique. CONTRAST:  75mL OMNIPAQUE IOHEXOL 350 MG/ML SOLN COMPARISON:  Chest radiograph from earlier today. CT chest 06/02/2017 FINDINGS: Cardiovascular: Motion artifact diminishes exam detail within the upper and lower lung zones. Within this limitation, there is no sign of acute pulmonary embolism to the level of the distal lobar pulmonary arteries. Previous median sternotomy. Heart size is normal. No pericardial effusion. Aortic atherosclerosis. Left circumflex coronary artery calcification noted. Mediastinum/Nodes: No mediastinal or hilar adenopathy. Thyroid gland, trachea and esophagus are unremarkable. Lungs/Pleura: Small to moderate right pleural effusion. Focal area of loculated fluid along the anterior aspect of the minor fissure noted. Mild consolidative  change within the posterior right base noted. Areas of subsegmental atelectasis identified in the posterior and anterior segments of the right lower lobe. Upper Abdomen: No acute abnormality. Musculoskeletal: No chest wall abnormality. No acute or significant osseous findings. Review of the MIP images confirms the above findings. IMPRESSION: 1. Motion artifact diminishes exam detail within the upper and lower lung zones. Within this limitation, there is no sign of acute pulmonary embolism to the level of the distal lobar pulmonary arteries. 2. Small to moderate right pleural effusion. Focal area of loculated fluid along the anterior aspect of the minor fissure noted. 3. Mild consolidative change within the posterior right base noted. Areas of subsegmental atelectasis identified in the posterior and anterior segments of the right lower lobe. 4. Coronary artery calcifications noted. 5.  Aortic Atherosclerosis (ICD10-I70.0). 1. Electronically Signed   By: Signa Kell M.D.   On: 03/21/2023 05:57   DG Chest Portable 1 View Result Date: 03/21/2023 CLINICAL DATA:  Cough, shortness of breath, headaches and sore throat, nasal congestion. 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 EXAM: PORTABLE CHEST 1 VIEW COMPARISON:  Chest CT with contrast 02/28/2023 FINDINGS: Airspace disease intermixed with linear atelectasis or scarring is again noted in the right lung base, but was seen previously and could be XRT related. Underlying pneumonia would be difficult to exclude. A small layering right pleural effusion appears similar. The remaining hypoexpanded lungs are generally clear. There is mild cardiomegaly, sternotomy sutures. Stable mediastinal configuration. No vascular congestion is seen.  No new osseous findings. IMPRESSION: 1. Airspace disease intermixed with linear atelectasis or scarring in the right lung base, but was seen previously and could be XRT  related. Underlying pneumonia would be difficult  to exclude. No new abnormality. 2. Small layering right pleural effusion appears similar. 3. Mild cardiomegaly. Electronically Signed   By: Almira Bar M.D.   On: 03/21/2023 04:57     Assessment and Plan:   Nonischemic cardiomyopathy Patient first noted to have cardiomyopathy with LVEF ~25% in 2015 following thymoma resection. LHC at that time showed normal coronary anatomy. Subsequent TTE showed LVEF in the range of normal, 45-50%. Patient now admitted with influenza A and had repeat TTE that found her LVEF slightly decrease, 35-40%. PTA meds included Coreg 12.5mg  BID, Lisinopril 5mg , Torsemide 20mg  BID. Prior to admission, NYHA class I symptoms. No symptoms to suggest acute CHF.  On personal review of both 2021 echo and echo this admission, LVEF is likely more stable than report indicates. In the setting of influenza, does not need further inpatient workup.  Torsemide and Lisinopril held with AKI. Use caution with IVF. Given reduced LVEF, would favor transition to ARB or ARNI upon recovery to renal function, stabilization of BP.  Patient's Coreg stopped yesterday in the setting of hypotension with sepsis criteria met. Resume prior to d/c.  Consider adding SGLT2 at discharge. Patient plans to follow up with [facility] cardiology.   Right sided pleural effusion Hypoxic respiratory failure Patient with influenza A requiring 3-4LPM O2. CT with moderate right sided pleural effusion with loculation along anterior portion of minor fissure.  Based on labs/symptoms, less likely that patient's cardiomyopathy contributed. Defer management and decision re thoracentesis to primary team.   Elevated troponin Patient with troponin 22->20. No chest pain. No acute ischemic changes on ECG. Echo not significantly changed from previous study. Consistent with demand ischemia in the setting of acute influenza infection with hypoxia.   Hypertension As above, Coreg and Lisinopril held with AKI and  hypotension.   Coronary artery calcifications Aortic atherosclerosis Hyperlipidemia CTA  chest noted LCX calcification as well as aortic atherosclerosis.  Continue ASA 81mg  and Crestor 40mg . LDL goal should be less than 70mg /dL.   Per primary team: Influenza A DM type II Thymoma with pleural metastasis   Risk Assessment/Risk Scores:        New York Heart Association (NYHA) Functional Class NYHA Class I        For questions or updates, please contact [facility] Please consult [email or web address] for contact info under    Signed, Perlie Gold, PA-C  03/23/2023 9:00 AM  Patient seen and examined with Perlie Gold, PA-C.  Agree as above, with the following exceptions and changes as noted below.  58 year old female with nonischemic cardiomyopathy intermittently followed at [facility] and otherwise following at [facility] for cardiology presents with influenza A.  History of thymoma with pleural metastasis, cardiomyopathy initially noted after resection of thymoma in 2015.  At 1 point echocardiogram demonstrated improvement in EF to 45 to 50%.  Echo images have been historically very challenging to obtain due to body habitus.  She had an echocardiogram in our system in 2021 at which point I thought the ejection fraction was 45 to 50% but very challenging to evaluate, EF likely 40-45.  Repeat echocardiogram this admission demonstrates EF again around 40 to 45%.  Overall this likely represents no significant clinical change over time.. Gen: NAD, CV: RRR, no murmurs, Lungs: Coarse bilaterally, Abd: soft, Extrem: Warm, well perfused, no edema, Neuro/Psych: alert and oriented x 3, normal mood and affect. All available labs, radiology testing, previous records reviewed.  Her GDMT has been held while recovering  from influenza A, this can be resumed as tolerated, agree as above that ARB may facilitate eventual ARNI initiation, consider transitioning.  Patient otherwise plans to follow-up with [facility] and further medication adjustment can be pursued at that point.  No signs of decompensated heart failure at this time, patient can continue to recover from influenza A and cardiology will sign off at this time.  Primary service should reach out if further questions arise during patient's hospital stay.  Parke Poisson, MD 03/23/23 1:13 PM

## 2023-03-23 NOTE — Progress Notes (Signed)
 Pharmacy Antibiotic Note  Kaitlin [name] is a 58 y.o. female admitted on 03/21/2023 with influenza A and pneumonia.  Patient was initially started on Tamiflu, Ceftriaxone and Azithromycin.  Ceftriaxone d/c'ed and Pharmacy has been consulted for Vancomycin and Cefepime dosing.  Plan: Vancomycin 2gm IV x 1 followed by Vancomycin 1000 mg IV Q 12 hrs. Goal AUC 400-550.  Expected AUC: 496.9  SCr used: 1.24 Cefepime 2g IV q8h Azithromycin per MD Follow renal function F/u culture results & sensitivities  Height: 5\' 8"  (172.7 cm) Weight: (!) 170.1 kg (375 lb) IBW/kg (Calculated) : 63.9  Temp (24hrs), Avg:98.9 F (37.2 C), Min:98.7 F (37.1 C), Max:99.1 F (37.3 C)  Recent Labs  Lab 03/21/23 0425 03/21/23 1721 03/22/23 0410 03/22/23 1431 03/22/23 1711 03/23/23 0437  WBC 8.0 8.4 9.4  --   --  7.2  CREATININE 0.98 0.81 1.29*  --   --  1.24*  LATICACIDVEN  --   --   --  1.2 0.8  --     Estimated Creatinine Clearance: 86.1 mL/min (A) (by C-G formula based on SCr of 1.24 mg/dL (H)).    Allergies  Allergen Reactions   Amoxicillin-Pot Clavulanate Diarrhea   Ibuprofen Other (See Comments)    Per MD she should no longer take, unsure why   Amoxicillin Diarrhea    Antimicrobials this admission: 3/10 Tamiflu >> 3/14 3/11 Ceftriaxone >>  3/12 3/11 Azithromycin >>   3/12 Vancomycin >> 3/12 Cefepime >>    Dose adjustments this admission:    Microbiology results: 3/11 BCx:   3/11 Sputum:    3/11 Influenza A +   Thank you for allowing pharmacy to be a part of this patient's care.  Maryellen Pile, PharmD 03/23/2023 9:16 PM

## 2023-03-23 NOTE — TOC CM/SW Note (Signed)
 Transition of Care [facility]) - Inpatient Brief Assessment   Patient Details  Name: Kaitlin [name] MRN: [id number] Date of Birth: 18-[name]-1965  [facility] CM/SW Contact:    Larrie Kass, LCSW Phone Number: 03/23/2023, 11:34 AM   Transition of Care Asessment: Insurance and Status: Insurance coverage has been reviewed Patient has primary care physician: Yes Home environment has been reviewed: home with self Prior level of function:: independent Prior/Current Home Services: No current home services Social Drivers of Health Review: SDOH reviewed no interventions necessary Readmission risk has been reviewed: Yes Transition of care needs: no transition of care needs at this time

## 2023-03-23 NOTE — Progress Notes (Signed)
 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  Found to have influenza A and also superimposed bacterial infection.  Echocardiogram done here showed EF of 35 to 40%, diastolic dysfunction.  Cardiology consulted and following  Assessment & Plan:  Principal Problem:   Acute respiratory failure with hypoxia (HCC) Active Problems:   Flu   Acute hypoxic respiratory failure: Secondary to flu and superimposed bacterial pneumonia.  Also found to have right-sided pleural effusion.  Patient is on 4 L of oxygen per minute.  Not on oxygen at home.  CT angiogram did not show any PE.  Hypoxia could be contributed underlying OSA/OSA due to morbid obesity  Right-sided pleural effusion: History of thymoma with pleural mets.  CT showed small to moderate right-sided pleural effusion, loculated fluid along the anterior aspect of the minor fissure, mild consolidative changes within posterior right base.  Plan is to repeat chest x-ray today.  Will consider thoracentesis if worsening pleural effusion.  Could also have been contributed by congestive heart failure.  Flu/community-acquired pneumonia: Currently on Tamiflu, ceftriaxone, azithromycin.  Also started on steroids.  Sputum culture showed mixed organisms.  Blood cultures have not shown any growth. Met sepsis criteria presented with fever, tachycardia, hypoxia.  No leucocytosis  Generalized weakness: Has chronic left lower extremity weakness.  PT/OT been consulted  AKI/hyperkalemia: Baseline creatinine normal.  Hold torsemide, lisinopril.  Given Lokelma for potassium of 5.2  Acute on chronic systolic/diastolic congestive heart failure: Echo done here showed EF of 35 to 40%, diastolic  dysfunction.    Cardiology consulted, recommend Coreg prior to discharge.  History of thymoma with pleural metastasis: Status post stereotactic body radiation therapy.  Follows with radiation therapy and oncology  Diabetes: On insulin pump at home,currently on same  Hypertension: Continue to monitor blood pressure.  Home coreg, lisinopril and torsemide on hold  History of hyperlipidemia: On Crestor  Skin lesion on left upper back.  Needs dermatology follow-up for biopsy      DVT prophylaxis:     Code Status: Full Code  Family Communication: Daughter at bedside  Patient status:Inpatient  Patient is from :Home  Anticipated discharge FA:OZHY vs SnF  Estimated DC date:2-3 days   Consultants: Cardiology  Procedures:None  Antimicrobials:  Anti-infectives (From admission, onward)    Start     Dose/Rate Route Frequency Ordered Stop   03/22/23 1000  cefTRIAXone (ROCEPHIN) 2 g in sodium chloride 0.9 % 100 mL IVPB       Note to Pharmacy: Retime as needed   2 g 200 mL/hr over 30 Minutes Intravenous Every 24 hours 03/21/23 1635 03/27/23 0959   03/22/23 1000  azithromycin (ZITHROMAX) 500 mg in sodium chloride 0.9 % 250 mL IVPB       Note to Pharmacy: Retime as needed   500 mg 250 mL/hr over 60 Minutes Intravenous Every 24 hours 03/21/23 1635 03/27/23 0959   03/21/23 0615  oseltamivir (TAMIFLU) capsule 75 mg        75 mg Oral 2 times daily 03/21/23 0612 03/26/23 0959   03/21/23 0515  cefTRIAXone (ROCEPHIN) 1 g in sodium chloride 0.9 % 100 mL IVPB        1 g 200 mL/hr over 30 Minutes Intravenous  Once 03/21/23 0505 03/21/23 8657  03/21/23 0515  azithromycin (ZITHROMAX) 500 mg in sodium chloride 0.9 % 250 mL IVPB        500 mg 250 mL/hr over 60 Minutes Intravenous  Once 03/21/23 0505 03/21/23 6222       Subjective: Patient seen and examined at bedside today.  Lying in the bed.  Very weak.  On 2.5 L of oxygen per minute with sleep.  She does not feel good.  Does not look in  respiratory distress but just appears tired.  Denies any worsening cough.  Daughter at bedside.  She was ambulatory at home.  Complains of severe weakness.  Objective: Vitals:   03/22/23 2118 03/22/23 2159 03/23/23 0238 03/23/23 0327  BP:  (!) 117/50 (!) 142/118 132/62  Pulse:  95 (!) 103 (!) 102  Resp:  16 20 20   Temp:  98.7 F (37.1 C) 98.8 F (37.1 C) 99.1 F (37.3 C)  TempSrc:  Oral Oral Oral  SpO2: 100% 96% 100% 98%  Weight:      Height:        Intake/Output Summary (Last 24 hours) at 03/23/2023 9798 Last data filed at 03/23/2023 0700 Gross per 24 hour  Intake 950 ml  Output 600 ml  Net 350 ml   Filed Weights   03/21/23 0413  Weight: (!) 170.1 kg    Examination:  General exam: Morbidly obese, weak appearing HEENT: PERRL Respiratory system:  no wheezes or crackles , diminished air sounds on bases Cardiovascular system: S1 & S2 heard, RRR.  Gastrointestinal system: Abdomen is nondistended, soft and nontender. Central nervous system: Alert and oriented Extremities: No edema, no clubbing ,no cyanosis Skin: No rashes, no ulcers,no icterus     Data Reviewed: I have personally reviewed following labs and imaging studies  CBC: Recent Labs  Lab 03/21/23 0425 03/21/23 1721 03/22/23 0410 03/23/23 0437  WBC 8.0 8.4 9.4 7.2  NEUTROABS 6.8  --   --  5.2  HGB 13.7 13.6 13.4 13.2  HCT 40.5 43.4 42.8 41.5  MCV 77.9* 83.0 84.3 83.3  PLT 238 229 233 236   Basic Metabolic Panel: Recent Labs  Lab 03/21/23 0425 03/21/23 1721 03/22/23 0410 03/23/23 0437  NA 137  --  138 139  K 4.0  --  5.2* 5.2*  CL 102  --  104 102  CO2 25  --  29 30  GLUCOSE 161*  --  138* 108*  BUN 18  --  18 32*  CREATININE 0.98 0.81 1.29* 1.24*  CALCIUM 8.8*  --  8.9 9.1  MG  --   --   --  2.5*  PHOS  --   --   --  4.1     Recent Results (from the past 240 hours)  Resp panel by RT-PCR (RSV, Flu A&B, Covid) Anterior Nasal Swab     Status: Abnormal   Collection Time: 03/21/23  4:25 AM    Specimen: Anterior Nasal Swab  Result Value Ref Range Status   SARS Coronavirus 2 by RT PCR NEGATIVE NEGATIVE Final    Comment: (NOTE) SARS-CoV-2 target nucleic acids are NOT DETECTED.  The SARS-CoV-2 RNA is generally detectable in upper respiratory specimens during the acute phase of infection. The lowest concentration of SARS-CoV-2 viral copies this assay can detect is 138 copies/mL. A negative result does not preclude SARS-Cov-2 infection and should not be used as the sole basis for treatment or other patient management decisions. A negative result may occur with  improper specimen collection/handling, submission of specimen  other than nasopharyngeal swab, presence of viral mutation(s) within the areas targeted by this assay, and inadequate number of viral copies(<138 copies/mL). A negative result must be combined with clinical observations, patient history, and epidemiological information. The expected result is Negative.  Fact Sheet for Patients:  BloggerCourse.com  Fact Sheet for Healthcare Providers:  SeriousBroker.it  This test is no t yet approved or cleared by the [place] FDA and  has been authorized for detection and/or diagnosis of SARS-CoV-2 by FDA under an Emergency Use Authorization (EUA). This EUA will remain  in effect (meaning this test can be used) for the duration of the COVID-19 declaration under Section 564(b)(1) of the Act, 21 U.S.C.section 360bbb-3(b)(1), unless the authorization is terminated  or revoked sooner.       Influenza A by PCR POSITIVE (A) NEGATIVE Final   Influenza B by PCR NEGATIVE NEGATIVE Final    Comment: (NOTE) The Xpert Xpress SARS-CoV-2/FLU/RSV plus assay is intended as an aid in the diagnosis of influenza from Nasopharyngeal swab specimens and should not be used as a sole basis for treatment. Nasal washings and aspirates are unacceptable for Xpert Xpress  SARS-CoV-2/FLU/RSV testing.  Fact Sheet for Patients: BloggerCourse.com  Fact Sheet for Healthcare Providers: SeriousBroker.it  This test is not yet approved or cleared by the [place] FDA and has been authorized for detection and/or diagnosis of SARS-CoV-2 by FDA under an Emergency Use Authorization (EUA). This EUA will remain in effect (meaning this test can be used) for the duration of the COVID-19 declaration under Section 564(b)(1) of the Act, 21 U.S.C. section 360bbb-3(b)(1), unless the authorization is terminated or revoked.     Resp Syncytial Virus by PCR NEGATIVE NEGATIVE Final    Comment: (NOTE) Fact Sheet for Patients: BloggerCourse.com  Fact Sheet for Healthcare Providers: SeriousBroker.it  This test is not yet approved or cleared by the [place] FDA and has been authorized for detection and/or diagnosis of SARS-CoV-2 by FDA under an Emergency Use Authorization (EUA). This EUA will remain in effect (meaning this test can be used) for the duration of the COVID-19 declaration under Section 564(b)(1) of the Act, 21 U.S.C. section 360bbb-3(b)(1), unless the authorization is terminated or revoked.  Performed at [facility], [address] [place], [place] [address]   MRSA Next Gen by PCR, Nasal     Status: None   Collection Time: 03/21/23  7:41 AM   Specimen: Nasal Mucosa; Nasal Swab  Result Value Ref Range Status   MRSA by PCR Next Gen NOT DETECTED NOT DETECTED Final    Comment: (NOTE) The GeneXpert MRSA Assay (FDA approved for NASAL specimens only), is one component of a comprehensive MRSA colonization surveillance program. It is not intended to diagnose MRSA infection nor to guide or monitor treatment for MRSA infections. Test performance is not FDA approved in patients less than 76 years old. Performed at [facility],  [address], [place], [place] [address]   Expectorated Sputum Assessment w Gram Stain, Rflx to Resp Cult     Status: None   Collection Time: 03/22/23  9:05 AM   Specimen: Sputum  Result Value Ref Range Status   Specimen Description SPUTUM  Final   Special Requests NONE  Final   Sputum evaluation   Final    THIS SPECIMEN IS ACCEPTABLE FOR SPUTUM CULTURE Performed at [facility], [address] [place], [place] [address]    Report Status 03/22/2023 FINAL  Final  Culture, Respiratory  w Gram Stain     Status: None (Preliminary result)   Collection Time: 03/22/23  9:05 AM   Specimen: SPU  Result Value Ref Range Status   Specimen Description   Final    SPUTUM Performed at [facility], [address] [place], [place] [address]    Special Requests   Final    NONE Reflexed from [id number] Performed at [facility], [address], [place], [place] [address]    Gram Stain   Final    NO WBC SEEN RARE GRAM POSITIVE COCCI IN PAIRS IN CLUSTERS RARE GRAM NEGATIVE RODS RARE GRAM POSITIVE RODS Performed at [facility], [address], [place], [place] [address]    Culture PENDING  Incomplete   Report Status PENDING  Incomplete  Culture, blood (Routine X 2) w Reflex to ID Panel     Status: None (Preliminary result)   Collection Time: 03/22/23  9:51 AM   Specimen: BLOOD RIGHT HAND  Result Value Ref Range Status   Specimen Description   Final    BLOOD RIGHT HAND Performed at [facility], [address] [place], [place] [address]    Special Requests   Final    BOTTLES DRAWN AEROBIC ONLY Blood Culture results may not be optimal due to an inadequate volume of blood received in culture bottles Performed at [facility], [address] [place], [place] [address]    Culture PENDING  Incomplete   Report Status PENDING  Incomplete  Culture, blood (Routine X 2) w Reflex to ID Panel     Status: None  (Preliminary result)   Collection Time: 03/22/23  9:56 AM   Specimen: BLOOD RIGHT HAND  Result Value Ref Range Status   Specimen Description   Final    BLOOD RIGHT HAND Performed at [facility], [address], [place], [place] [address]    Special Requests   Final    BOTTLES DRAWN AEROBIC ONLY Blood Culture results may not be optimal due to an inadequate volume of blood received in culture bottles Performed at [facility], [address] [place], [place] [address]    Culture PENDING  Incomplete   Report Status PENDING  Incomplete  Respiratory (~20 pathogens) panel by PCR     Status: Abnormal   Collection Time: 03/22/23  4:19 PM   Specimen: Nasopharyngeal Swab; Respiratory  Result Value Ref Range Status   Adenovirus NOT DETECTED NOT DETECTED Final   Coronavirus 229E NOT DETECTED NOT DETECTED Final    Comment: (NOTE) The Coronavirus on the Respiratory Panel, DOES NOT test for the novel  Coronavirus (2019 nCoV)    Coronavirus HKU1 NOT DETECTED NOT DETECTED Final   Coronavirus NL63 NOT DETECTED NOT DETECTED Final   Coronavirus OC43 NOT DETECTED NOT DETECTED Final   Metapneumovirus NOT DETECTED NOT DETECTED Final   Rhinovirus / Enterovirus NOT DETECTED NOT DETECTED Final   Influenza A H1 2009 DETECTED (A) NOT DETECTED Final   Influenza B NOT DETECTED NOT DETECTED Final   Parainfluenza Virus 1 NOT DETECTED NOT DETECTED Final   Parainfluenza Virus 2 NOT DETECTED NOT DETECTED Final   Parainfluenza Virus 3 NOT DETECTED NOT DETECTED Final   Parainfluenza Virus 4 NOT DETECTED NOT DETECTED Final   Respiratory Syncytial Virus NOT DETECTED NOT DETECTED Final   Bordetella pertussis NOT DETECTED NOT DETECTED Final   Bordetella Parapertussis NOT DETECTED NOT DETECTED Final   Chlamydophila pneumoniae NOT DETECTED  NOT DETECTED Final   Mycoplasma pneumoniae NOT DETECTED NOT DETECTED Final    Comment: Performed at [facility], [address] [place],  [place] [address]     Radiology Studies: ECHOCARDIOGRAM COMPLETE Result Date: 03/22/2023    ECHOCARDIOGRAM REPORT   Patient Name:   Kaitlin [name] Date of Exam: 03/22/2023 Medical Rec #:  [id number]       Height:       68.0 in Accession #:    [id number]      Weight:       375.0 lb Date of Birth:  02-21-1965        BSA:          2.670 m Patient Age:    57 years        BP:           97/50 mmHg Patient Gender: F               HR:           97 bpm. Exam Location:  Inpatient Procedure: 2D Echo, Cardiac Doppler, Color Doppler and Intracardiac            Opacification Agent (Both Spectral and Color Flow Doppler were            utilized during procedure). Indications:    Other abnormalities of the heart  History:        Patient has prior history of Echocardiogram examinations, most                 recent 01/16/2019. Signs/Symptoms:Dyspnea; Risk                 Factors:Hypertension, Diabetes and Dyslipidemia.  Sonographer:    Vern Claude Referring Phys: [id number] A CALDWELL POWELL JR  Sonographer Comments: Image acquisition challenging due to patient body habitus. IMPRESSIONS  1. Left ventricular ejection fraction, by estimation, is 35 to 40%. The left ventricle has moderately decreased function. Left ventricular endocardial border not optimally defined to evaluate regional wall motion. Left ventricular diastolic parameters are consistent with Grade I diastolic dysfunction (impaired relaxation).  2. Right ventricular systolic function is normal. The right ventricular size is normal.  3. The mitral valve was not well visualized. Trivial mitral valve regurgitation. No evidence of mitral stenosis.  4. The aortic valve is tricuspid. Aortic valve regurgitation is not visualized. No aortic stenosis is present. Conclusion(s)/Recommendation(s): Technically very limit study due to poor sound wave transmission. FINDINGS  Left Ventricle: Left ventricular ejection fraction, by estimation, is 35 to 40%. The left ventricle has moderately decreased  function. Left ventricular endocardial border not optimally defined to evaluate regional wall motion. The left ventricular internal cavity size was normal in size. There is no left ventricular hypertrophy. Left ventricular diastolic parameters are consistent with Grade I diastolic dysfunction (impaired relaxation). Right Ventricle: The right ventricular size is normal. No increase in right ventricular wall thickness. Right ventricular systolic function is normal. Left Atrium: Left atrial size was normal in size. Right Atrium: Right atrial size was normal in size. Pericardium: There is no evidence of pericardial effusion. Mitral Valve: The mitral valve was not well visualized. Trivial mitral valve regurgitation. No evidence of mitral valve stenosis. MV peak gradient, 3.0 mmHg. The mean mitral valve gradient is 2.0 mmHg. Tricuspid Valve: The tricuspid valve is not well visualized. Tricuspid valve regurgitation is trivial. No evidence of tricuspid stenosis. Aortic Valve: The aortic valve is tricuspid. Aortic valve regurgitation is not visualized. No aortic  stenosis is present. Aortic valve mean gradient measures 5.0 mmHg. Aortic valve peak gradient measures 9.5 mmHg. Aortic valve area, by VTI measures 1.07 cm. Pulmonic Valve: The pulmonic valve was normal in structure. Pulmonic valve regurgitation is not visualized. No evidence of pulmonic stenosis. Aorta: The aortic root is normal in size and structure. Venous: The inferior vena cava was not well visualized. IAS/Shunts: No atrial level shunt detected by color flow Doppler.  LEFT VENTRICLE PLAX 2D LVIDd:         4.70 cm      Diastology LVIDs:         3.60 cm      LV e' medial:    5.87 cm/s LV PW:         0.90 cm      LV E/e' medial:  9.0 LV IVS:        0.70 cm      LV e' lateral:   4.35 cm/s LVOT diam:     2.00 cm      LV E/e' lateral: 12.1 LV SV:         24 LV SV Index:   9 LVOT Area:     3.14 cm  LV Volumes (MOD) LV vol d, MOD A2C: 98.9 ml LV vol d, MOD A4C: 244.5  ml LV vol s, MOD A2C: 48.9 ml LV vol s, MOD A4C: 130.0 ml LV SV MOD A2C:     50.0 ml LV SV MOD A4C:     244.5 ml LV SV MOD BP:      82.0 ml RIGHT VENTRICLE             IVC RV Basal diam:  3.70 cm     IVC diam: 1.80 cm RV S prime:     11.00 cm/s LEFT ATRIUM             Index        RIGHT ATRIUM           Index LA diam:        3.70 cm 1.39 cm/m   RA Area:     13.00 cm LA Vol (A2C):   38.5 ml 14.42 ml/m  RA Volume:   30.00 ml  11.24 ml/m LA Vol (A4C):   63.9 ml 23.94 ml/m LA Biplane Vol: 53.5 ml 20.04 ml/m  AORTIC VALVE                    PULMONIC VALVE AV Area (Vmax):    0.89 cm     PV Vmax:       1.33 m/s AV Area (Vmean):   0.99 cm     PV Peak grad:  7.1 mmHg AV Area (VTI):     1.07 cm AV Vmax:           154.00 cm/s AV Vmean:          98.500 cm/s AV VTI:            0.224 m AV Peak Grad:      9.5 mmHg AV Mean Grad:      5.0 mmHg LVOT Vmax:         43.70 cm/s LVOT Vmean:        31.100 cm/s LVOT VTI:          0.076 m LVOT/AV VTI ratio: 0.34  AORTA Ao Root diam: 3.00 cm Ao Asc diam:  2.60 cm MITRAL VALVE MV Area (PHT): 4.31 cm    SHUNTS  MV Area VTI:   1.31 cm    Systemic VTI:  0.08 m MV Peak grad:  3.0 mmHg    Systemic Diam: 2.00 cm MV Mean grad:  2.0 mmHg MV Vmax:       0.86 m/s MV Vmean:      72.5 cm/s MV Decel Time: 176 msec MV E velocity: 52.70 cm/s MV A velocity: 46.30 cm/s MV E/A ratio:  1.14 Arvilla Meres MD Electronically signed by Arvilla Meres MD Signature Date/Time: 03/22/2023/10:25:44 AM    Final     Scheduled Meds:  ascorbic acid  1,500 mg Oral Daily   aspirin EC  81 mg Oral Daily   calcium-vitamin D  2 tablet Oral Q breakfast   cholecalciferol  5,000 Units Oral Daily   enoxaparin (LOVENOX) injection  80 mg Subcutaneous QHS   ferrous sulfate  325 mg Oral QODAY   fluticasone  1 spray Each Nare Daily   insulin pump   Subcutaneous TID WC, HS, 0200   ipratropium-albuterol  3 mL Nebulization TID   loratadine  10 mg Oral Daily   magnesium oxide  400 mg Oral Daily   oseltamivir  75 mg  Oral BID   predniSONE  40 mg Oral Q breakfast   rosuvastatin  40 mg Oral Daily   Continuous Infusions:  azithromycin Stopped (03/22/23 1235)   cefTRIAXone (ROCEPHIN)  IV Stopped (03/22/23 1331)     LOS: 2 days   Burnadette Pop, MD [facility] P3/12/2023, 8:23 AM

## 2023-03-24 ENCOUNTER — Inpatient Hospital Stay (HOSPITAL_COMMUNITY)

## 2023-03-24 DIAGNOSIS — J9601 Acute respiratory failure with hypoxia: Secondary | ICD-10-CM | POA: Diagnosis not present

## 2023-03-24 LAB — CBC
HCT: 38.9 % (ref 36.0–46.0)
Hemoglobin: 12.4 g/dL (ref 12.0–15.0)
MCH: 26.1 pg (ref 26.0–34.0)
MCHC: 31.9 g/dL (ref 30.0–36.0)
MCV: 81.9 fL (ref 80.0–100.0)
Platelets: 221 10*3/uL (ref 150–400)
RBC: 4.75 MIL/uL (ref 3.87–5.11)
RDW: 15.3 % (ref 11.5–15.5)
WBC: 6.5 10*3/uL (ref 4.0–10.5)
nRBC: 0 % (ref 0.0–0.2)

## 2023-03-24 LAB — BASIC METABOLIC PANEL
Anion gap: 6 (ref 5–15)
BUN: 21 mg/dL — ABNORMAL HIGH (ref 6–20)
CO2: 31 mmol/L (ref 22–32)
Calcium: 8.9 mg/dL (ref 8.9–10.3)
Chloride: 102 mmol/L (ref 98–111)
Creatinine, Ser: 0.89 mg/dL (ref 0.44–1.00)
GFR, Estimated: >60 mL/min (ref >60)
Glucose, Bld: 118 mg/dL — ABNORMAL HIGH (ref 70–99)
Potassium: 4.2 mmol/L (ref 3.5–5.1)
Sodium: 139 mmol/L (ref 135–145)

## 2023-03-24 LAB — GLUCOSE, CAPILLARY
Glucose-Capillary: 80 mg/dL (ref 70–99)
Glucose-Capillary: 113 mg/dL — ABNORMAL HIGH (ref 70–99)
Glucose-Capillary: 253 mg/dL — ABNORMAL HIGH (ref 70–99)
Glucose-Capillary: 162 mg/dL — ABNORMAL HIGH (ref 70–99)

## 2023-03-24 LAB — CULTURE, RESPIRATORY W GRAM STAIN: Gram Stain: NONE SEEN

## 2023-03-24 MED ORDER — CARVEDILOL 3.125 MG PO TABS
3.1250 mg | ORAL_TABLET | Freq: Two times a day (BID) | ORAL | Status: DC
  Administered 2023-03-24 – 2023-03-25 (×2): 3.125 mg via ORAL
  Filled 2023-03-24 (×2): qty 1

## 2023-03-24 MED ORDER — LOSARTAN POTASSIUM 25 MG PO TABS
12.5000 mg | ORAL_TABLET | Freq: Every day | ORAL | Status: DC
  Administered 2023-03-24 – 2023-03-25 (×2): 12.5 mg via ORAL
  Filled 2023-03-24 (×2): qty 1

## 2023-03-24 MED ORDER — DAPAGLIFLOZIN PROPANEDIOL 10 MG PO TABS
10.0000 mg | ORAL_TABLET | Freq: Every day | ORAL | Status: DC
  Administered 2023-03-24 – 2023-03-25 (×2): 10 mg via ORAL
  Filled 2023-03-24 (×2): qty 1

## 2023-03-24 MED ORDER — IPRATROPIUM-ALBUTEROL 0.5-2.5 (3) MG/3ML IN SOLN
3.0000 mL | Freq: Three times a day (TID) | RESPIRATORY_TRACT | Status: DC
  Administered 2023-03-25 (×2): 3 mL via RESPIRATORY_TRACT
  Filled 2023-03-24 (×2): qty 3

## 2023-03-24 NOTE — Evaluation (Signed)
 Physical Therapy Evaluation Patient Details Name: Kaitlin [name] MRN: [id number] DOB: 1965-06-10 Today's Date: 03/24/2023  History of Present Illness  Kaitlin [name] is a 58 y.o. female admitted on 03/21/2023 due to SOB dx with influenza A and pneumonia. 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  Clinical Impression  Pt admitted with above diagnosis.  Pt currently with functional limitations due to the deficits listed below (see PT Problem List). Pt will benefit from acute skilled PT to increase their independence and safety with mobility to allow discharge.  PT reports only being up to [facility] since admission but agreeable to ambulate as tolerated.  Pt ambulated short distance in hallway limited by dizziness (see mobility section below for details).  Pt will likely progress to return home without PT needs if she remains mobile this admission (ie. Up to recliner, ambulate to bathroom and in hallways with staff as tolerated).          If plan is discharge home, recommend the following: Assistance with cooking/housework;Help with stairs or ramp for entrance   Can travel by private vehicle        Equipment Recommendations Rolling walker (2 wheels) (pt uncertain where is RW is located)  Recommendations for Other Services       Functional Status Assessment Patient has had a recent decline in their functional status and demonstrates the ability to make significant improvements in function in a reasonable and predictable amount of time.     Precautions / Restrictions Precautions Precautions: Fall      Mobility  Bed Mobility Overal bed mobility: Needs Assistance Bed Mobility: Supine to Sit     Supine to sit: Supervision, HOB elevated     General bed mobility comments: supervision for lines, SPO2 95% on room air    Transfers Overall transfer level: Needs  assistance Equipment used: Rolling walker (2 wheels) Transfers: Sit to/from Stand Sit to Stand: Contact guard assist           General transfer comment: utlized RW today but pt typically does not use at baseline, cues for hand placement    Ambulation/Gait Ambulation/Gait assistance: Contact guard assist Gait Distance (Feet): 80 Feet Assistive device: Rolling walker (2 wheels) Gait Pattern/deviations: Step-through pattern, Decreased stride length Gait velocity: decr     General Gait Details: cues for use of RW, SPO2 93% on room air, pt reported dizziness which did not worsen; BP upon return to room 137/70 mmHg, reapplied O2 [place] (on 3L on arrival but decreased to 1L) with RN aware  Stairs            Wheelchair Mobility     Tilt Bed    Modified Rankin (Stroke Patients Only)       Balance Overall balance assessment: Needs assistance         Standing balance support: No upper extremity supported Standing balance-Leahy Scale: Fair Standing balance comment: static fair                             Pertinent Vitals/Pain Pain Assessment Pain Assessment: No/denies pain    Home Living Family/patient expects to be discharged to:: Private residence Living Arrangements: Children Available Help at Discharge: Family;Available 24 hours/day   Home Access: Level entry       Home Layout: One level Home Equipment: None      Prior Function Prior  Level of Function : Independent/Modified Independent                     Extremity/Trunk Assessment        Lower Extremity Assessment Lower Extremity Assessment: Generalized weakness       Communication   Communication Communication: No apparent difficulties    Cognition Arousal: Alert Behavior During Therapy: WFL for tasks assessed/performed   PT - Cognitive impairments: No apparent impairments                         Following commands: Intact       Cueing       General  Comments      Exercises     Assessment/Plan    PT Assessment Patient needs continued PT services  PT Problem List Decreased strength;Decreased activity tolerance;Decreased balance;Decreased mobility;Decreased knowledge of use of DME;Cardiopulmonary status limiting activity;Obesity       PT Treatment Interventions DME instruction;Gait training;Balance training;Functional mobility training;Therapeutic activities;Therapeutic exercise;Patient/family education    PT Goals (Current goals can be found in the Care Plan section)  Acute Rehab PT Goals PT Goal Formulation: With patient Time For Goal Achievement: 04/07/23    Frequency Min 2X/week     Co-evaluation               AM-PAC PT "6 Clicks" Mobility  Outcome Measure Help needed turning from your back to your side while in a flat bed without using bedrails?: A Little Help needed moving from lying on your back to sitting on the side of a flat bed without using bedrails?: A Little Help needed moving to and from a bed to a chair (including a wheelchair)?: A Little Help needed standing up from a chair using your arms (e.g., wheelchair or bedside chair)?: A Little Help needed to walk in hospital room?: A Little Help needed climbing 3-5 steps with a railing? : A Lot 6 Click Score: 17    End of Session Equipment Utilized During Treatment: Gait belt Activity Tolerance: Patient tolerated treatment well Patient left: in chair;with call bell/phone within reach;with chair alarm set Nurse Communication: Mobility status PT Visit Diagnosis: Difficulty in walking, not elsewhere classified (R26.2)    Time: 9562-1308 PT Time Calculation (min) (ACUTE ONLY): 21 min   Charges:   PT Evaluation $PT Eval Low Complexity: 1 Low   PT General Charges $$ ACUTE PT VISIT: 1 Visit       Thomasene Mohair PT, DPT Physical Therapist Acute Rehabilitation Services Office: [phone]   Janan Halter [place] 03/24/2023, 12:09 PM

## 2023-03-24 NOTE — Progress Notes (Signed)
 PROGRESS NOTE  Kaitlin [name]  ZOX:[id number] DOB: March 14, 1965 DOA: 03/21/2023 PCP: Judd Lien, PA-C   Brief Narrative: Patient is a 58 year old female with history of HFrEF, type II Diabetes, morbid obesity, thymoma with pleural mets status post radiation therapy who presented with shortness of breath from home.  Found to have influenza A and also superimposed bacterial infection.  Echocardiogram done here showed EF of 35 to 40%, diastolic dysfunction.  Cardiology was consulted .  Clinically improving  Assessment & Plan:  Principal Problem:   Acute respiratory failure with hypoxia (HCC) Active Problems:   Flu   Acute hypoxic respiratory failure: Secondary to flu and superimposed bacterial pneumonia.  Also found to have right-sided pleural effusion.  Patient is on 3L of oxygen per minute this mrng.  Not on oxygen at home.  CT angiogram did not show any PE.  Hypoxia could be contributed underlying OSA/OSA due to morbid obesity.  She does not complain of any shortness of breath or cough today.  Continue to wean oxygen.  Right-sided pleural effusion: History of thymoma with pleural mets.  CT showed small to moderate right-sided pleural effusion, loculated fluid along the anterior aspect of the minor fissure, mild consolidative changes within posterior right base.  Chest x-ray on 3/12 showed small to moderate right pleural effusion.  Ordered US thoracentesis  Flu/community-acquired pneumonia: Currently on Tamiflu, cefepime.  Also started on steroids.  Sputum culture showed mixed organisms.  Blood cultures have not shown any growth. Met sepsis criteria presented with fever, tachycardia, hypoxia.  No leucocytosis.  Afebrile this morning  Generalized weakness: Has chronic left lower extremity weakness.  PT/OT been consulted, no follow-up recommended  AKI/hyperkalemia: Resolved  Acute on chronic systolic/diastolic congestive heart failure: Echo done here showed EF of 35 to 40%, diastolic  dysfunction.  As per cardiology recommendation, restarted low-dose Coreg, started on low-dose losartan and added SGLT2.  She follows with [facility] cardiology. Torsemide at home, currently on hold due to AKI.  Will check if we need to resume on discharge.  History of thymoma with pleural metastasis: Status post stereotactic body radiation therapy.  Follows with radiation therapy and oncology  Diabetes: On insulin pump at home,currently on same  Hypertension: Continue to monitor blood pressure.  Stable  History of hyperlipidemia: On Crestor  Skin lesion on left upper back.  Needs dermatology follow-up for biopsy      DVT prophylaxis:     Code Status: Full Code  Family Communication: Daughter at bedside  Patient status:Inpatient  Patient is from :Home  Anticipated discharge WJ:XBJY   Estimated DC date:1-2 days   Consultants: Cardiology  Procedures:None  Antimicrobials:  Anti-infectives (From admission, onward)    Start     Dose/Rate Route Frequency Ordered Stop   03/24/23 1000  azithromycin (ZITHROMAX) tablet 500 mg        500 mg Oral Daily 03/23/23 1313 03/26/23 0959   03/24/23 1000  vancomycin (VANCOCIN) IVPB 1000 mg/200 mL premix  Status:  Discontinued        1,000 mg 200 mL/hr over 60 Minutes Intravenous Every 12 hours 03/23/23 2116 03/24/23 0913   03/23/23 2200  vancomycin (VANCOREADY) IVPB 2000 mg/400 mL        2,000 mg 200 mL/hr over 120 Minutes Intravenous  Once 03/23/23 2113 03/24/23 0127   03/23/23 2200  ceFEPIme (MAXIPIME) 2 g in sodium chloride 0.9 % 100 mL IVPB        2 g 200 mL/hr over 30 Minutes Intravenous Every  8 hours 03/23/23 2113     03/22/23 1000  cefTRIAXone (ROCEPHIN) 2 g in sodium chloride 0.9 % 100 mL IVPB  Status:  Discontinued       Note to Pharmacy: Retime as needed   2 g 200 mL/hr over 30 Minutes Intravenous Every 24 hours 03/21/23 1635 03/23/23 2043   03/22/23 1000  azithromycin (ZITHROMAX) 500 mg in sodium chloride 0.9 % 250 mL  IVPB  Status:  Discontinued       Note to Pharmacy: Retime as needed   500 mg 250 mL/hr over 60 Minutes Intravenous Every 24 hours 03/21/23 1635 03/23/23 1313   03/21/23 0615  oseltamivir (TAMIFLU) capsule 75 mg        75 mg Oral 2 times daily 03/21/23 0612 03/26/23 0959   03/21/23 0515  cefTRIAXone (ROCEPHIN) 1 g in sodium chloride 0.9 % 100 mL IVPB        1 g 200 mL/hr over 30 Minutes Intravenous  Once 03/21/23 0505 03/21/23 0621   03/21/23 0515  azithromycin (ZITHROMAX) 500 mg in sodium chloride 0.9 % 250 mL IVPB        500 mg 250 mL/hr over 60 Minutes Intravenous  Once 03/21/23 0505 03/21/23 6962       Subjective: Patient seen and examined at bedside today.  She looks much better today.  Comfortable.  Still has some headache.  On 2 to 3 L of oxygen per minute.  Any worsening shortness of breath or cough.  Objective: Vitals:   03/24/23 0125 03/24/23 0456 03/24/23 0807 03/24/23 1034  BP:  116/65  137/70  Pulse:  93 100   Resp:  17 18   Temp:  98.1 F (36.7 C)    TempSrc:  Oral    SpO2: 95% 100% 95%   Weight:      Height:        Intake/Output Summary (Last 24 hours) at 03/24/2023 1229 Last data filed at 03/24/2023 0600 Gross per 24 hour  Intake 488.97 ml  Output 900 ml  Net -411.03 ml   Filed Weights   03/21/23 0413  Weight: (!) 170.1 kg    Examination:  General exam: Overall comfortable, not in distress, morbidly obese HEENT: PERRL Respiratory system:  no wheezes or crackles , diminished breath sounds on bases Cardiovascular system: S1 & S2 heard, RRR.  Gastrointestinal system: Abdomen is nondistended, soft and nontender. Central nervous system: Alert and oriented Extremities: No edema, no clubbing ,no cyanosis Skin: No rashes, no ulcers,no icterus     Data Reviewed: I have personally reviewed following labs and imaging studies  CBC: Recent Labs  Lab 03/21/23 0425 03/21/23 1721 03/22/23 0410 03/23/23 0437 03/24/23 0414  WBC 8.0 8.4 9.4 7.2 6.5   NEUTROABS 6.8  --   --  5.2  --   HGB 13.7 13.6 13.4 13.2 12.4  HCT 40.5 43.4 42.8 41.5 38.9  MCV 77.9* 83.0 84.3 83.3 81.9  PLT 238 229 233 236 221   Basic Metabolic Panel: Recent Labs  Lab 03/21/23 0425 03/21/23 1721 03/22/23 0410 03/23/23 0437 03/24/23 0414  NA 137  --  138 139 139  K 4.0  --  5.2* 5.2* 4.2  CL 102  --  104 102 102  CO2 25  --  29 30 31   GLUCOSE 161*  --  138* 108* 118*  BUN 18  --  18 32* 21*  CREATININE 0.98 0.81 1.29* 1.24* 0.89  CALCIUM 8.8*  --  8.9 9.1 8.9  MG  --   --   --  2.5*  --   PHOS  --   --   --  4.1  --      Recent Results (from the past 240 hours)  Resp panel by RT-PCR (RSV, Flu A&B, Covid) Anterior Nasal Swab     Status: Abnormal   Collection Time: 03/21/23  4:25 AM   Specimen: Anterior Nasal Swab  Result Value Ref Range Status   SARS Coronavirus 2 by RT PCR NEGATIVE NEGATIVE Final    Comment: (NOTE) SARS-CoV-2 target nucleic acids are NOT DETECTED.  The SARS-CoV-2 RNA is generally detectable in upper respiratory specimens during the acute phase of infection. The lowest concentration of SARS-CoV-2 viral copies this assay can detect is 138 copies/mL. A negative result does not preclude SARS-Cov-2 infection and should not be used as the sole basis for treatment or other patient management decisions. A negative result may occur with  improper specimen collection/handling, submission of specimen other than nasopharyngeal swab, presence of viral mutation(s) within the areas targeted by this assay, and inadequate number of viral copies(<138 copies/mL). A negative result must be combined with clinical observations, patient history, and epidemiological information. The expected result is Negative.  Fact Sheet for Patients:  BloggerCourse.com  Fact Sheet for Healthcare Providers:  SeriousBroker.it  This test is no t yet approved or cleared by the [place] FDA and  has been  authorized for detection and/or diagnosis of SARS-CoV-2 by FDA under an Emergency Use Authorization (EUA). This EUA will remain  in effect (meaning this test can be used) for the duration of the COVID-19 declaration under Section 564(b)(1) of the Act, 21 U.S.C.section 360bbb-3(b)(1), unless the authorization is terminated  or revoked sooner.       Influenza A by PCR POSITIVE (A) NEGATIVE Final   Influenza B by PCR NEGATIVE NEGATIVE Final    Comment: (NOTE) The Xpert Xpress SARS-CoV-2/FLU/RSV plus assay is intended as an aid in the diagnosis of influenza from Nasopharyngeal swab specimens and should not be used as a sole basis for treatment. Nasal washings and aspirates are unacceptable for Xpert Xpress SARS-CoV-2/FLU/RSV testing.  Fact Sheet for Patients: BloggerCourse.com  Fact Sheet for Healthcare Providers: SeriousBroker.it  This test is not yet approved or cleared by the [place] FDA and has been authorized for detection and/or diagnosis of SARS-CoV-2 by FDA under an Emergency Use Authorization (EUA). This EUA will remain in effect (meaning this test can be used) for the duration of the COVID-19 declaration under Section 564(b)(1) of the Act, 21 U.S.C. section 360bbb-3(b)(1), unless the authorization is terminated or revoked.     Resp Syncytial Virus by PCR NEGATIVE NEGATIVE Final    Comment: (NOTE) Fact Sheet for Patients: BloggerCourse.com  Fact Sheet for Healthcare Providers: SeriousBroker.it  This test is not yet approved or cleared by the [place] FDA and has been authorized for detection and/or diagnosis of SARS-CoV-2 by FDA under an Emergency Use Authorization (EUA). This EUA will remain in effect (meaning this test can be used) for the duration of the COVID-19 declaration under Section 564(b)(1) of the Act, 21 U.S.C. section 360bbb-3(b)(1), unless the  authorization is terminated or revoked.  Performed at [facility], [address] [place], [place] [address]   MRSA Next Gen by PCR, Nasal     Status: None   Collection Time: 03/21/23  7:41 AM   Specimen: Nasal Mucosa; Nasal Swab  Result Value Ref Range Status   MRSA by PCR Next Gen NOT DETECTED NOT DETECTED Final  Comment: (NOTE) The GeneXpert MRSA Assay (FDA approved for NASAL specimens only), is one component of a comprehensive MRSA colonization surveillance program. It is not intended to diagnose MRSA infection nor to guide or monitor treatment for MRSA infections. Test performance is not FDA approved in patients less than 70 years old. Performed at [facility], [address] [place], [place] [address]   Expectorated Sputum Assessment w Gram Stain, Rflx to Resp Cult     Status: None   Collection Time: 03/22/23  9:05 AM   Specimen: Sputum  Result Value Ref Range Status   Specimen Description SPUTUM  Final   Special Requests NONE  Final   Sputum evaluation   Final    THIS SPECIMEN IS ACCEPTABLE FOR SPUTUM CULTURE Performed at [facility], [address], [place], [place] [address]    Report Status 03/22/2023 FINAL  Final  Culture, Respiratory w Gram Stain     Status: None (Preliminary result)   Collection Time: 03/22/23  9:05 AM   Specimen: SPU  Result Value Ref Range Status   Specimen Description   Final    SPUTUM Performed at [facility], [address], [place], [place] [address]    Special Requests   Final    NONE Reflexed from [id number] Performed at [facility], [address] [place], [place] [address]    Gram Stain   Final    NO WBC SEEN RARE GRAM POSITIVE COCCI IN PAIRS IN CLUSTERS RARE GRAM NEGATIVE RODS RARE GRAM POSITIVE RODS    Culture   Final    CULTURE REINCUBATED FOR BETTER GROWTH Performed at [facility], [address], [place], [place]  [address]    Report Status PENDING  Incomplete  Culture, blood (Routine X 2) w Reflex to ID Panel     Status: None (Preliminary result)   Collection Time: 03/22/23  9:51 AM   Specimen: BLOOD RIGHT HAND  Result Value Ref Range Status   Specimen Description   Final    BLOOD RIGHT HAND Performed at [facility], [address], [place], [place] [address]    Special Requests   Final    BOTTLES DRAWN AEROBIC ONLY Blood Culture results may not be optimal due to an inadequate volume of blood received in culture bottles Performed at [facility], [address], [place], [place] [address]    Culture   Final    NO GROWTH 2 DAYS Performed at [facility], [address], [place], [place] [address]    Report Status PENDING  Incomplete  Culture, blood (Routine X 2) w Reflex to ID Panel     Status: None (Preliminary result)   Collection Time: 03/22/23  9:56 AM   Specimen: BLOOD RIGHT HAND  Result Value Ref Range Status   Specimen Description   Final    BLOOD RIGHT HAND Performed at [facility], [address], [place], [place] [address]    Special Requests   Final    BOTTLES DRAWN AEROBIC ONLY Blood Culture results may not be optimal due to an inadequate volume of blood received in culture bottles Performed at [facility], [address] [place], [place] [address]    Culture   Final    NO GROWTH 2 DAYS Performed at [facility], [address] [place], [place] [address]    Report Status PENDING  Incomplete  Respiratory (~20  pathogens) panel by PCR     Status: Abnormal   Collection Time: 03/22/23  4:19 PM   Specimen: Nasopharyngeal Swab; Respiratory  Result Value Ref Range Status   Adenovirus NOT DETECTED NOT DETECTED Final   Coronavirus 229E NOT DETECTED NOT DETECTED Final    Comment: (NOTE) The Coronavirus on the Respiratory Panel, DOES NOT test for the novel  Coronavirus (2019 nCoV)    Coronavirus HKU1 NOT DETECTED NOT  DETECTED Final   Coronavirus NL63 NOT DETECTED NOT DETECTED Final   Coronavirus OC43 NOT DETECTED NOT DETECTED Final   Metapneumovirus NOT DETECTED NOT DETECTED Final   Rhinovirus / Enterovirus NOT DETECTED NOT DETECTED Final   Influenza A H1 2009 DETECTED (A) NOT DETECTED Final   Influenza B NOT DETECTED NOT DETECTED Final   Parainfluenza Virus 1 NOT DETECTED NOT DETECTED Final   Parainfluenza Virus 2 NOT DETECTED NOT DETECTED Final   Parainfluenza Virus 3 NOT DETECTED NOT DETECTED Final   Parainfluenza Virus 4 NOT DETECTED NOT DETECTED Final   Respiratory Syncytial Virus NOT DETECTED NOT DETECTED Final   Bordetella pertussis NOT DETECTED NOT DETECTED Final   Bordetella Parapertussis NOT DETECTED NOT DETECTED Final   Chlamydophila pneumoniae NOT DETECTED NOT DETECTED Final   Mycoplasma pneumoniae NOT DETECTED NOT DETECTED Final    Comment: Performed at [facility], [address], [place], [place] [address]     Radiology Studies: DG Chest 2 View Result Date: 03/23/2023 CLINICAL DATA:  Pleural effusion shortness of breath EXAM: CHEST - 2 VIEW COMPARISON:  03/21/2023, CT chest 03/21/2023 FINDINGS: Post sternotomy changes. Low lung volumes. Cardiomegaly with small moderate right pleural effusion. Worsening airspace disease in the mid to lower lung zones. No pneumothorax IMPRESSION: Cardiomegaly with small to moderate right pleural effusion. Worsening airspace disease in the mid to lower lung zones which may be due to worsening atelectasis and or pneumonia. Electronically Signed   By: Jasmine Pang M.D.   On: 03/23/2023 18:32    Scheduled Meds:  ascorbic acid  1,500 mg Oral Daily   aspirin EC  81 mg Oral Daily   azithromycin  500 mg Oral Daily   calcium-vitamin D  2 tablet Oral Q breakfast   cholecalciferol  5,000 Units Oral Daily   enoxaparin (LOVENOX) injection  80 mg Subcutaneous QHS   ferrous sulfate  325 mg Oral QODAY   fluticasone  1 spray Each Nare Daily   insulin pump    Subcutaneous TID WC, HS, 0200   ipratropium-albuterol  3 mL Nebulization Q6H   loratadine  10 mg Oral Daily   oseltamivir  75 mg Oral BID   rosuvastatin  40 mg Oral Daily   Continuous Infusions:  ceFEPime (MAXIPIME) IV 2 g (03/24/23 0545)     LOS: 3 days   Burnadette Pop, MD [facility] P3/13/2025, 12:29 PM

## 2023-03-24 NOTE — Evaluation (Signed)
 Occupational Therapy Evaluation Patient Details Name: Kaitlin [name] MRN: [id number] DOB: Aug 03, 1965 Today's Date: 03/24/2023   History of Present Illness   Kaitlin [name] is a 58 y.o. female admitted on 03/21/2023 due to SOB dx Acute hypoxic respiratory failure: Secondary to flu and superimposed bacterial pneumonia.  Also found to have right-sided pleural effusion. 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     Clinical Impressions Pt typically independent in ADL and mobility, although continues to struggle with decreased activity tolerance since COVID (2021). LIves with her daughter and 68 y/o grandchild. Today performing BSC transfer with [facility] for line management. Multiple sit<>stand for peri care (which she performed in standing) resulting in DOE 2/4 with the sit<>stand and bending. OT will follow acutely due to dx as stated below. Next session to focus on [facility] education (please take handout) AE education, and continued OOB activity. Do not anticipate the need for post-acute OT at this time.      If plan is discharge home, recommend the following:   A little help with walking and/or transfers;A little help with bathing/dressing/bathroom;Assistance with cooking/housework;Assist for transportation;Help with stairs or ramp for entrance     Functional Status Assessment   Patient has had a recent decline in their functional status and demonstrates the ability to make significant improvements in function in a reasonable and predictable amount of time.     Equipment Recommendations   BSC/3in1 (WIDE)     Recommendations for Other Services   PT consult     Precautions/Restrictions   Precautions Precautions: Fall Restrictions Weight Bearing Restrictions Per Provider Order: No     Mobility Bed Mobility               General bed mobility comments: OOB in  recliner at beginning and end of session    Transfers Overall transfer level: Needs assistance Equipment used: None Transfers: Bed to chair/wheelchair/BSC       Step pivot transfers: Contact guard assist     General transfer comment: to wide BSC, OT performing line management      Balance Overall balance assessment: Needs assistance Sitting-balance support: Feet supported Sitting balance-Leahy Scale: Good     Standing balance support: No upper extremity supported Standing balance-Leahy Scale: Fair Standing balance comment: static fair                           ADL either performed or assessed with clinical judgement   ADL Overall ADL's : Needs assistance/impaired Eating/Feeding: Independent   Grooming: Wash/dry face;Sitting;Set up Grooming Details (indicate cue type and reason): requires seated position for activity tolerance Upper Body Bathing: Minimal assistance Upper Body Bathing Details (indicate cue type and reason): for back Lower Body Bathing: Moderate assistance;Sitting/lateral leans   Upper Body Dressing : Set up Upper Body Dressing Details (indicate cue type and reason): extra time Lower Body Dressing: Moderate assistance   Toilet Transfer: Contact guard Therapist, sports Details (indicate cue type and reason): no DME Toileting- Clothing Manipulation and Hygiene: Supervision/safety;Sit to/from stand Toileting - Clothing Manipulation Details (indicate cue type and reason): front and back     Functional mobility during ADLs: Contact guard assist (no DME, short distance in room) General ADL Comments: decreased activity tolerance, generalized weakness     Vision Baseline Vision/History: 1 Wears glasses Ability to See in Adequate Light: 0 Adequate Patient Visual Report: No  change from baseline Vision Assessment?: No apparent visual deficits     Perception         Praxis         Pertinent Vitals/Pain Pain  Assessment Pain Assessment: No/denies pain     Extremity/Trunk Assessment Upper Extremity Assessment Upper Extremity Assessment: Overall WFL for tasks assessed   Lower Extremity Assessment Lower Extremity Assessment: Defer to PT evaluation   Cervical / Trunk Assessment Cervical / Trunk Assessment: Other exceptions Cervical / Trunk Exceptions: BMI 57   Communication Communication Communication: No apparent difficulties   Cognition Arousal: Alert Behavior During Therapy: WFL for tasks assessed/performed Cognition: No apparent impairments                               Following commands: Intact       Cueing  General Comments      Pt on 1L O2 throughout session, DOE 2/4 with multiple sit<>stand for peri care   Exercises     Shoulder Instructions      Home Living Family/patient expects to be discharged to:: Private residence Living Arrangements: Children Available Help at Discharge: Family;Available 24 hours/day Type of Home: Apartment Home Access: Level entry     Home Layout: One level     Bathroom Shower/Tub: Chief Strategy Officer: Standard     Home Equipment: None          Prior Functioning/Environment Prior Level of Function : Independent/Modified Independent                    OT Problem List: Decreased strength;Decreased activity tolerance;Decreased safety awareness;Decreased knowledge of use of DME or AE;Obesity   OT Treatment/Interventions: Self-care/ADL training;Energy conservation;DME and/or AE instruction;Therapeutic activities;Patient/family education;Balance training      OT Goals(Current goals can be found in the care plan section)   Acute Rehab OT Goals Patient Stated Goal: get stronger again OT Goal Formulation: With patient Time For Goal Achievement: 04/07/23 Potential to Achieve Goals: Good ADL Goals Pt Will Perform Grooming: with supervision;standing Pt Will Perform Upper Body Dressing: with  set-up;sitting Pt Will Perform Lower Body Dressing: with set-up;sit to/from stand Pt Will Transfer to Toilet: with supervision;ambulating Pt Will Perform Toileting - Clothing Manipulation and hygiene: with modified independence;sit to/from stand Additional ADL Goal #1: Pt will verbalize at least 3 strategies for energy conservation with no cues   OT Frequency:  Min 2X/week    Co-evaluation              AM-PAC OT "6 Clicks" Daily Activity     Outcome Measure Help from another person eating meals?: None Help from another person taking care of personal grooming?: A Little Help from another person toileting, which includes using toliet, bedpan, or urinal?: A Little Help from another person bathing (including washing, rinsing, drying)?: A Little Help from another person to put on and taking off regular upper body clothing?: A Little Help from another person to put on and taking off regular lower body clothing?: A Little 6 Click Score: 19   End of Session Equipment Utilized During Treatment: Oxygen;Gait belt (1L) Nurse Communication: Mobility status;Precautions  Activity Tolerance: Patient tolerated treatment well;Patient limited by fatigue Patient left: in chair;with call bell/phone within reach;with chair alarm set  OT Visit Diagnosis: Unsteadiness on feet (R26.81);Other abnormalities of gait and mobility (R26.89);Muscle weakness (generalized) (M62.81)  Time: 4098-1191 OT Time Calculation (min): 14 min Charges:  OT General Charges $OT Visit: 1 Visit OT Evaluation $OT Eval Moderate Complexity: 1 Mod  Nyoka Cowden OTR/L Acute Rehabilitation Services Office: [phone]  Evern [employer] [facility] 03/24/2023, 1:16 PM

## 2023-03-24 NOTE — Progress Notes (Addendum)
 PHARMACY NOTE -  Cefepime  Pharmacy has been assisting with dosing of cefepime for pneumonia/sepsis. Dosage remains stable at 2g IV q8 hr and further renal adjustments per institutional Pharmacy antibiotic protocol  Pharmacy will sign off, following peripherally for culture results, dose adjustments, and length of therapy. Please reconsult if a change in clinical status warrants re-evaluation of dosage.  Bernadene Person, PharmD, BCPS [phone] 03/24/2023, 9:14 AM

## 2023-03-25 ENCOUNTER — Encounter: Payer: Self-pay | Admitting: Oncology

## 2023-03-25 ENCOUNTER — Other Ambulatory Visit (HOSPITAL_COMMUNITY): Payer: Self-pay

## 2023-03-25 DIAGNOSIS — J9601 Acute respiratory failure with hypoxia: Secondary | ICD-10-CM | POA: Diagnosis not present

## 2023-03-25 LAB — GLUCOSE, CAPILLARY
Glucose-Capillary: 103 mg/dL — ABNORMAL HIGH (ref 70–99)
Glucose-Capillary: 127 mg/dL — ABNORMAL HIGH (ref 70–99)

## 2023-03-25 MED ORDER — DAPAGLIFLOZIN PROPANEDIOL 10 MG PO TABS
10.0000 mg | ORAL_TABLET | Freq: Every day | ORAL | 0 refills | Status: AC
  Filled 2023-03-25: qty 30, 30d supply, fill #0

## 2023-03-25 MED ORDER — LOSARTAN POTASSIUM 25 MG PO TABS
12.5000 mg | ORAL_TABLET | Freq: Every day | ORAL | 0 refills | Status: AC
  Filled 2023-03-25: qty 30, 60d supply, fill #0

## 2023-03-25 MED ORDER — CARVEDILOL 6.25 MG PO TABS
3.1250 mg | ORAL_TABLET | Freq: Two times a day (BID) | ORAL | 0 refills | Status: AC
  Filled 2023-03-25: qty 60, 60d supply, fill #0

## 2023-03-25 MED ORDER — TORSEMIDE 20 MG PO TABS: 20.0000 mg | ORAL_TABLET | Freq: Every day | ORAL | Status: AC

## 2023-03-25 MED ORDER — OSELTAMIVIR PHOSPHATE 75 MG PO CAPS
75.0000 mg | ORAL_CAPSULE | Freq: Two times a day (BID) | ORAL | 0 refills | Status: AC
  Filled 2023-03-25: qty 1, 1d supply, fill #0

## 2023-03-25 MED ORDER — GUAIFENESIN 100 MG/5ML PO LIQD
10.0000 mL | Freq: Four times a day (QID) | ORAL | 0 refills | Status: AC | PRN
  Filled 2023-03-25: qty 120, 3d supply, fill #0

## 2023-03-25 MED ORDER — LEVOFLOXACIN 750 MG PO TABS
750.0000 mg | ORAL_TABLET | Freq: Every day | ORAL | 0 refills | Status: AC
  Filled 2023-03-25: qty 3, 3d supply, fill #0

## 2023-03-25 NOTE — Progress Notes (Signed)
 Discharge instructions reviewed with patient and son. All questions answered. All belongings accounted for. Patient to follow up with MD in  1 weeks. Patient medications hand delivered from outpatient pharmacy. PIV removed. Assisted via WC to private vehicle.

## 2023-03-25 NOTE — Discharge Summary (Signed)
 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  Found to have influenza A and also superimposed bacterial infection.  Echocardiogram done here showed EF of 35 to 40%, diastolic dysfunction.  Cardiology was consulted .  Clinically improving now.  Has been weaned to room air.  Cultures have not shown any growth.  Medically stable for discharge with oral antibiotics.  She will follow-up with her cardiologist as an outpatient.  Following problems were addressed during the hospitalization:  Acute hypoxic respiratory failure: Secondary to flu and superimposed bacterial pneumonia.  Also found to have right-sided pleural effusion.    CT angiogram did not show any PE.  She does not complain of any shortness of breath or cough today.  On room air   Right-sided pleural effusion: History of thymoma with pleural mets.  CT showed small to moderate right-sided pleural effusion, loculated fluid along the anterior aspect of the minor fissure, mild consolidative changes within posterior right base.  Chest x-ray on 3/12 showed small to moderate right pleural effusion.  Radiologist checked, not enough fluid to be taken out   Flu/community-acquired pneumonia: Started  on Tamiflu, cefepime, steroids.  Sputum culture showed mixed organisms.  Blood cultures have not shown any growth. Met sepsis criteria presented with fever, tachycardia, hypoxia.  No leucocytosis.  Afebrile this morning.  Cultures have remained negative so far.  Antibiotics changed to oral    Generalized weakness: Has chronic left lower extremity weakness.  PT/OT been consulted, no follow-up recommended   AKI/hyperkalemia: Resolved   Acute on chronic systolic/diastolic congestive heart failure: Echo done here showed EF of 35 to 40%, diastolic dysfunction.  As per cardiology recommendation, restarted low-dose Coreg, started on low-dose losartan and added SGLT2.  She follows with [facility] cardiology. Torsemide at home, can resume with daily dose on discharge  History of thymoma with pleural metastasis: Status post stereotactic body radiation therapy.  Follows with radiation therapy and oncology   Diabetes: On insulin pump at home,currently on same   Hypertension: Continue to monitor blood pressure at home   History of hyperlipidemia: On Crestor   Skin lesion on left upper back.  Needs dermatology follow-up for biopsy    Discharge Diagnoses:  Principal Problem:   Acute respiratory failure with hypoxia (HCC) Active Problems:   Flu    Discharge Instructions  Discharge Instructions     Diet - low sodium heart healthy   Complete by: As directed    Discharge instructions   Complete by: As directed    1)Please take prescribed medications as instructed 2)Follow up with your PCP in a week 3)Follow up with your cardiologist as an outpatient.   Increase activity slowly   Complete by: As directed       Allergies as of 03/25/2023       Reactions   Amoxicillin-pot Clavulanate Diarrhea   Ibuprofen Other (See Comments)   Per MD she should no longer take, unsure why   Amoxicillin Diarrhea        Medication List     STOP taking these medications    lisinopril 5 MG tablet Commonly known as:  ZESTRIL       TAKE these medications    acetaminophen 325 MG tablet Commonly known as: Tylenol Take 2 tablets (650 mg total) by mouth every 6 (six) hours as needed for moderate pain (pain score 4-6).   albuterol 108 (90 Base) MCG/ACT inhaler Commonly known as:  VENTOLIN HFA One puff every 6 hour as needed What changed:  how much to take how to take this when to take this reasons to take this additional instructions   ascorbic acid 500 MG tablet Commonly known as: VITAMIN C Take 500 mg by mouth daily.   aspirin EC 81 MG tablet Take 1 tablet (81 mg total) by mouth daily.   benzonatate 200 MG capsule Commonly known as: TESSALON Take 200 mg by mouth 3 (three) times daily as needed for cough.   calcium-vitamin D 500-200 MG-UNIT tablet Commonly known as: OSCAL WITH D Take 2 tablets by mouth daily with breakfast.   carvedilol 6.25 MG tablet Commonly known as: COREG Take 0.5 tablets (3.125 mg total) by mouth 2 (two) times daily with a meal. What changed:  medication strength how much to take when to take this   Centrum Silver 50+Women Tabs Take 1 tablet by mouth daily with breakfast.   Cyanocobalamin 2000 MCG Tbcr Take 2,000 mcg by mouth daily.   dapagliflozin propanediol 10 MG Tabs tablet Commonly known as: FARXIGA Take 1 tablet (10 mg total) by mouth daily. Start taking on: March 26, 2023   Dexcom G7 Sensor Misc Inject 1 Device into the skin See admin instructions. Place 1 sensor into the skin every 10 days   diclofenac 50 MG EC tablet Commonly known as: VOLTAREN TAKE 1 TABLET(50 MG) BY MOUTH DAILY FOR ARTHRITIS What changed: See the new instructions.   FeroSul 325 (65 Fe) MG tablet Generic drug: ferrous sulfate TAKE 1 TABLET(325 MG) BY MOUTH DAILY WITH BREAKFAST   fexofenadine 180 MG tablet Commonly known as: ALLEGRA Take 180 mg by mouth at bedtime.   fluticasone 50 MCG/ACT nasal spray Commonly known as: FLONASE Place 1 spray into both nostrils daily.   guaiFENesin 100 MG/5ML liquid Commonly known as: ROBITUSSIN Take 10 mLs by mouth every 6 (six) hours as needed for cough or to loosen phlegm.   HumaLOG 100 UNIT/ML injection Generic drug: insulin lispro Inject into the skin See admin instructions. Per pump-  replenish every 2 days   levofloxacin 750 MG tablet Commonly known as: Levaquin Take 1 tablet (750 mg total) by mouth daily for 3 days.   losartan 25 MG tablet Commonly known as: COZAAR Take 0.5 tablets (12.5 mg total) by mouth daily. Start taking on: March 26, 2023   magnesium oxide 400 (241.3 Mg) MG tablet Commonly known as: MAG-OX Take 1 tablet (400 mg total) by mouth 2 (two) times daily. What changed: when to take this   Omnipod 5 G7 Pods (Gen 5) Misc Inject 1 Device into the skin every other day.   oseltamivir 75 MG capsule Commonly known as: TAMIFLU Take 1 capsule (75 mg total) by mouth 2 (two) times daily for 1 day.   potassium chloride SA 20 MEQ tablet Commonly known as: KLOR-CON M Take 20 mEq by mouth daily.   rosuvastatin 40 MG tablet Commonly known as: CRESTOR Take 1 tablet (40 mg total) by mouth daily.   torsemide 20 MG tablet Commonly known as: DEMADEX Take 1 tablet (20 mg total) by mouth daily. What changed: when to take this   Vitamin D-3 125 MCG (5000  UT) Tabs Take 5,000 Units by mouth daily.   Zinc 50 MG Tabs Take 50 mg by mouth daily.        Follow-up Information     Irving Copas H, PA-C. Schedule an appointment as soon as possible for a visit in 1 week(s).   Specialty: Physician Assistant Contact information: [phone] [place] [address] [phone]                Allergies  Allergen Reactions   Amoxicillin-Pot Clavulanate Diarrhea   Ibuprofen Other (See Comments)    Per MD she should no longer take, unsure why   Amoxicillin Diarrhea    Consultations: Cardiology   Procedures/Studies: [place] CHEST (PLEURAL EFFUSION) Result Date: 03/24/2023 CLINICAL DATA:  Patient with history of morbid obesity, thymoma with pleural metastasis, dyspnea; request received for thoracentesis. EXAM: LIMITED CHEST ULTRASOUND COMPARISON:  CHEST X-RAY 03/23/2023 FINDINGS: Limited ultrasound of both right and left pleural spaces  reveal only trace right pleural effusion. Amount not adequate to safely perform thoracentesis at this time. IMPRESSION: Trace right pleural effusion noted by limited ultrasound today. Thoracentesis not performed. Read by: Artemio Aly Electronically Signed   By: Marliss Coots M.D.   On: 03/24/2023 14:33   DG Chest 2 View Result Date: 03/23/2023 CLINICAL DATA:  Pleural effusion shortness of breath EXAM: CHEST - 2 VIEW COMPARISON:  03/21/2023, CT chest 03/21/2023 FINDINGS: Post sternotomy changes. Low lung volumes. Cardiomegaly with small moderate right pleural effusion. Worsening airspace disease in the mid to lower lung zones. No pneumothorax IMPRESSION: Cardiomegaly with small to moderate right pleural effusion. Worsening airspace disease in the mid to lower lung zones which may be due to worsening atelectasis and or pneumonia. Electronically Signed   By: Jasmine Pang M.D.   On: 03/23/2023 18:32   ECHOCARDIOGRAM COMPLETE Result Date: 03/22/2023    ECHOCARDIOGRAM REPORT   Patient Name:   Kaitlin [name] Date of Exam: 03/22/2023 Medical Rec #:  [id number]       Height:       68.0 in Accession #:    [id number]      Weight:       375.0 lb Date of Birth:  01/04/1966        BSA:          2.670 m Patient Age:    57 years        BP:           97/50 mmHg Patient Gender: F               HR:           97 bpm. Exam Location:  Inpatient Procedure: 2D Echo, Cardiac Doppler, Color Doppler and Intracardiac            Opacification Agent (Both Spectral and Color Flow Doppler were            utilized during procedure). Indications:    Other abnormalities of the heart  History:        Patient has prior history of Echocardiogram examinations, most                 recent 01/16/2019. Signs/Symptoms:Dyspnea; Risk                 Factors:Hypertension, Diabetes and Dyslipidemia.  Sonographer:    Vern Claude Referring Phys: [phone] A CALDWELL POWELL JR  Sonographer Comments: Image acquisition challenging due to patient body habitus.  IMPRESSIONS  1. Left ventricular  ejection fraction, by estimation, is 35 to 40%. The left ventricle has moderately decreased function. Left ventricular endocardial border not optimally defined to evaluate regional wall motion. Left ventricular diastolic parameters are consistent with Grade I diastolic dysfunction (impaired relaxation).  2. Right ventricular systolic function is normal. The right ventricular size is normal.  3. The mitral valve was not well visualized. Trivial mitral valve regurgitation. No evidence of mitral stenosis.  4. The aortic valve is tricuspid. Aortic valve regurgitation is not visualized. No aortic stenosis is present. Conclusion(s)/Recommendation(s): Technically very limit study due to poor sound wave transmission. FINDINGS  Left Ventricle: Left ventricular ejection fraction, by estimation, is 35 to 40%. The left ventricle has moderately decreased function. Left ventricular endocardial border not optimally defined to evaluate regional wall motion. The left ventricular internal cavity size was normal in size. There is no left ventricular hypertrophy. Left ventricular diastolic parameters are consistent with Grade I diastolic dysfunction (impaired relaxation). Right Ventricle: The right ventricular size is normal. No increase in right ventricular wall thickness. Right ventricular systolic function is normal. Left [facility]: Left atrial size was normal in size. Right [facility]: Right atrial size was normal in size. Pericardium: There is no evidence of pericardial effusion. Mitral Valve: The mitral valve was not well visualized. Trivial mitral valve regurgitation. No evidence of mitral valve stenosis. MV peak gradient, 3.0 mmHg. The mean mitral valve gradient is 2.0 mmHg. Tricuspid Valve: The tricuspid valve is not well visualized. Tricuspid valve regurgitation is trivial. No evidence of tricuspid stenosis. Aortic Valve: The aortic valve is tricuspid. Aortic valve regurgitation is not visualized. No  aortic stenosis is present. Aortic valve mean gradient measures 5.0 mmHg. Aortic valve peak gradient measures 9.5 mmHg. Aortic valve area, by VTI measures 1.07 cm. Pulmonic Valve: The pulmonic valve was normal in structure. Pulmonic valve regurgitation is not visualized. No evidence of pulmonic stenosis. Aorta: The aortic root is normal in size and structure. Venous: The inferior vena cava was not well visualized. IAS/Shunts: No atrial level shunt detected by color flow Doppler.  LEFT VENTRICLE PLAX 2D LVIDd:         4.70 cm      Diastology LVIDs:         3.60 cm      LV e' medial:    5.87 cm/s LV PW:         0.90 cm      LV E/e' medial:  9.0 LV IVS:        0.70 cm      LV e' lateral:   4.35 cm/s LVOT diam:     2.00 cm      LV E/e' lateral: 12.1 LV SV:         24 LV SV Index:   9 LVOT Area:     3.14 cm  LV Volumes (MOD) LV vol d, MOD A2C: 98.9 ml LV vol d, MOD A4C: 244.5 ml LV vol s, MOD A2C: 48.9 ml LV vol s, MOD A4C: 130.0 ml LV SV MOD A2C:     50.0 ml LV SV MOD A4C:     244.5 ml LV SV MOD BP:      82.0 ml RIGHT VENTRICLE             IVC RV Basal diam:  3.70 cm     IVC diam: 1.80 cm RV S prime:     11.00 cm/s LEFT ATRIUM             Index  RIGHT ATRIUM           Index LA diam:        3.70 cm 1.39 cm/m   RA Area:     13.00 cm LA Vol (A2C):   38.5 ml 14.42 ml/m  RA Volume:   30.00 ml  11.24 ml/m LA Vol (A4C):   63.9 ml 23.94 ml/m LA Biplane Vol: 53.5 ml 20.04 ml/m  AORTIC VALVE                    PULMONIC VALVE AV Area (Vmax):    0.89 cm     PV Vmax:       1.33 m/s AV Area (Vmean):   0.99 cm     PV Peak grad:  7.1 mmHg AV Area (VTI):     1.07 cm AV Vmax:           154.00 cm/s AV Vmean:          98.500 cm/s AV VTI:            0.224 m AV Peak Grad:      9.5 mmHg AV Mean Grad:      5.0 mmHg LVOT Vmax:         43.70 cm/s LVOT Vmean:        31.100 cm/s LVOT VTI:          0.076 m LVOT/AV VTI ratio: 0.34  AORTA Ao Root diam: 3.00 cm Ao Asc diam:  2.60 cm MITRAL VALVE MV Area (PHT): 4.31 cm    SHUNTS MV  Area VTI:   1.31 cm    Systemic VTI:  0.08 m MV Peak grad:  3.0 mmHg    Systemic Diam: 2.00 cm MV Mean grad:  2.0 mmHg MV Vmax:       0.86 m/s MV Vmean:      72.5 cm/s MV Decel Time: 176 msec MV E velocity: 52.70 cm/s MV A velocity: 46.30 cm/s MV E/A ratio:  1.14 Arvilla Meres MD Electronically signed by Arvilla Meres MD Signature Date/Time: 03/22/2023/10:25:44 AM    Final    CT Angio Chest PE W and/or Wo Contrast Result Date: 03/21/2023 CLINICAL DATA:  Evaluate for pulmonary embolism. Complains of cough, shortness of breath, nasal congestion. EXAM: CT ANGIOGRAPHY CHEST WITH CONTRAST TECHNIQUE: Multidetector CT imaging of the chest was performed using the standard protocol during bolus administration of intravenous contrast. Multiplanar CT image reconstructions and MIPs were obtained to evaluate the vascular anatomy. RADIATION DOSE REDUCTION: This exam was performed according to the departmental dose-optimization program which includes automated exposure control, adjustment of the mA and/or kV according to patient size and/or use of iterative reconstruction technique. CONTRAST:  75mL OMNIPAQUE IOHEXOL 350 MG/ML SOLN COMPARISON:  Chest radiograph from earlier today. CT chest 06/02/2017 FINDINGS: Cardiovascular: Motion artifact diminishes exam detail within the upper and lower lung zones. Within this limitation, there is no sign of acute pulmonary embolism to the level of the distal lobar pulmonary arteries. Previous median sternotomy. Heart size is normal. No pericardial effusion. Aortic atherosclerosis. Left circumflex coronary artery calcification noted. Mediastinum/Nodes: No mediastinal or hilar adenopathy. Thyroid gland, trachea and esophagus are unremarkable. Lungs/Pleura: Small to moderate right pleural effusion. Focal area of loculated fluid along the anterior aspect of the minor fissure noted. Mild consolidative change within the posterior right base noted. Areas of subsegmental atelectasis  identified in the posterior and anterior segments of the right lower lobe. Upper Abdomen: No acute abnormality. Musculoskeletal: No chest wall  abnormality. No acute or significant osseous findings. Review of the MIP images confirms the above findings. IMPRESSION: 1. Motion artifact diminishes exam detail within the upper and lower lung zones. Within this limitation, there is no sign of acute pulmonary embolism to the level of the distal lobar pulmonary arteries. 2. Small to moderate right pleural effusion. Focal area of loculated fluid along the anterior aspect of the minor fissure noted. 3. Mild consolidative change within the posterior right base noted. Areas of subsegmental atelectasis identified in the posterior and anterior segments of the right lower lobe. 4. Coronary artery calcifications noted. 5.  Aortic Atherosclerosis (ICD10-I70.0). 1. Electronically Signed   By: Signa Kell M.D.   On: 03/21/2023 05:57   DG Chest Portable 1 View Result Date: 03/21/2023 CLINICAL DATA:  Cough, shortness of breath, headaches and sore throat, nasal congestion. 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 EXAM: PORTABLE CHEST 1 VIEW COMPARISON:  Chest CT with contrast 02/28/2023 FINDINGS: Airspace disease intermixed with linear atelectasis or scarring is again noted in the right lung base, but was seen previously and could be XRT related. Underlying pneumonia would be difficult to exclude. A small layering right pleural effusion appears similar. The remaining hypoexpanded lungs are generally clear. There is mild cardiomegaly, sternotomy sutures. Stable mediastinal configuration. No vascular congestion is seen.  No new osseous findings. IMPRESSION: 1. Airspace disease intermixed with linear atelectasis or scarring in the right lung base, but was seen previously and could be XRT related. Underlying pneumonia would be difficult to exclude. No new abnormality. 2. Small layering right pleural effusion  appears similar. 3. Mild cardiomegaly. Electronically Signed   By: Almira Bar M.D.   On: 03/21/2023 04:57      Subjective: Patient seen and examined at bedside today.  Comfortable.  Hemodynamically stable.  Sitting in the chair.  No headache or chest pain or shortness of breath today.  On room air.  Medically stable for discharge  Discharge Exam: Vitals:   03/25/23 0426 03/25/23 0900  BP: (!) 153/80   Pulse: 100   Resp: 19   Temp: 99 F (37.2 C)   SpO2: 95% 94%   Vitals:   03/24/23 2006 03/24/23 2036 03/25/23 0426 03/25/23 0900  BP: 134/76  (!) 153/80   Pulse: 87  100   Resp: 18  19   Temp: 98.4 F (36.9 C)  99 F (37.2 C)   TempSrc: Oral  Oral   SpO2: 95% 92% 95% 94%  Weight:      Height:        General: Pt is alert, awake, not in acute distress, morbidly obese Cardiovascular: RRR, S1/S2 +, no rubs, no gallops Respiratory: CTA bilaterally, no wheezing, no rhonchi Abdominal: Soft, NT, ND, bowel sounds + Extremities: no edema, no cyanosis    The results of significant diagnostics from this hospitalization (including imaging, microbiology, ancillary and laboratory) are listed below for reference.     Microbiology: Recent Results (from the past 240 hours)  Resp panel by RT-PCR (RSV, Flu A&B, Covid) Anterior Nasal Swab     Status: Abnormal   Collection Time: 03/21/23  4:25 AM   Specimen: Anterior Nasal Swab  Result Value Ref Range Status   SARS Coronavirus 2 by RT PCR NEGATIVE NEGATIVE Final    Comment: (NOTE) SARS-CoV-2 target nucleic acids are NOT DETECTED.  The SARS-CoV-2 RNA is generally detectable in upper respiratory specimens during the acute phase of infection. The lowest concentration of SARS-CoV-2 viral copies  this assay can detect is 138 copies/mL. A negative result does not preclude SARS-Cov-2 infection and should not be used as the sole basis for treatment or other patient management decisions. A negative result may occur with  improper specimen  collection/handling, submission of specimen other than nasopharyngeal swab, presence of viral mutation(s) within the areas targeted by this assay, and inadequate number of viral copies(<138 copies/mL). A negative result must be combined with clinical observations, patient history, and epidemiological information. The expected result is Negative.  Fact Sheet for Patients:  BloggerCourse.com  Fact Sheet for Healthcare Providers:  SeriousBroker.it  This test is no t yet approved or cleared by the [place] FDA and  has been authorized for detection and/or diagnosis of SARS-CoV-2 by FDA under an Emergency Use Authorization (EUA). This EUA will remain  in effect (meaning this test can be used) for the duration of the COVID-19 declaration under Section 564(b)(1) of the Act, 21 U.S.C.section 360bbb-3(b)(1), unless the authorization is terminated  or revoked sooner.       Influenza A by PCR POSITIVE (A) NEGATIVE Final   Influenza B by PCR NEGATIVE NEGATIVE Final    Comment: (NOTE) The Xpert Xpress SARS-CoV-2/FLU/RSV plus assay is intended as an aid in the diagnosis of influenza from Nasopharyngeal swab specimens and should not be used as a sole basis for treatment. Nasal washings and aspirates are unacceptable for Xpert Xpress SARS-CoV-2/FLU/RSV testing.  Fact Sheet for Patients: BloggerCourse.com  Fact Sheet for Healthcare Providers: SeriousBroker.it  This test is not yet approved or cleared by the [place] FDA and has been authorized for detection and/or diagnosis of SARS-CoV-2 by FDA under an Emergency Use Authorization (EUA). This EUA will remain in effect (meaning this test can be used) for the duration of the COVID-19 declaration under Section 564(b)(1) of the Act, 21 U.S.C. section 360bbb-3(b)(1), unless the authorization is terminated or revoked.     Resp Syncytial  Virus by PCR NEGATIVE NEGATIVE Final    Comment: (NOTE) Fact Sheet for Patients: BloggerCourse.com  Fact Sheet for Healthcare Providers: SeriousBroker.it  This test is not yet approved or cleared by the [place] FDA and has been authorized for detection and/or diagnosis of SARS-CoV-2 by FDA under an Emergency Use Authorization (EUA). This EUA will remain in effect (meaning this test can be used) for the duration of the COVID-19 declaration under Section 564(b)(1) of the Act, 21 U.S.C. section 360bbb-3(b)(1), unless the authorization is terminated or revoked.  Performed at [facility], [address] [place], [place] [address]   MRSA Next Gen by PCR, Nasal     Status: None   Collection Time: 03/21/23  7:41 AM   Specimen: Nasal Mucosa; Nasal Swab  Result Value Ref Range Status   MRSA by PCR Next Gen NOT DETECTED NOT DETECTED Final    Comment: (NOTE) The GeneXpert MRSA Assay (FDA approved for NASAL specimens only), is one component of a comprehensive MRSA colonization surveillance program. It is not intended to diagnose MRSA infection nor to guide or monitor treatment for MRSA infections. Test performance is not FDA approved in patients less than 50 years old. Performed at [facility], [address], [place], [place] [address]   Expectorated Sputum Assessment w Gram Stain, Rflx to Resp Cult     Status: None   Collection Time: 03/22/23  9:05 AM   Specimen: Sputum  Result Value Ref Range Status   Specimen Description SPUTUM  Final   Special Requests NONE  Final   Sputum evaluation   Final    THIS SPECIMEN IS ACCEPTABLE FOR SPUTUM CULTURE Performed at [facility], [address] [place], [place] [address]    Report Status 03/22/2023 FINAL  Final  Culture, Respiratory w Gram Stain     Status: None   Collection Time: 03/22/23  9:05 AM   Specimen: SPU  Result Value Ref  Range Status   Specimen Description   Final    SPUTUM Performed at [facility], [address] [place], [place] [address]    Special Requests   Final    NONE Reflexed from [id number] Performed at [facility], [address] [place], [place] [address]    Gram Stain   Final    NO WBC SEEN RARE GRAM POSITIVE COCCI IN PAIRS IN CLUSTERS RARE GRAM NEGATIVE RODS RARE GRAM POSITIVE RODS    Culture   Final    ABUNDANT Normal respiratory flora-no Staph aureus or Pseudomonas seen Performed at [facility], [address] [place], [place] [address]    Report Status 03/24/2023 FINAL  Final  Culture, blood (Routine X 2) w Reflex to ID Panel     Status: None (Preliminary result)   Collection Time: 03/22/23  9:51 AM   Specimen: BLOOD RIGHT HAND  Result Value Ref Range Status   Specimen Description   Final    BLOOD RIGHT HAND Performed at [facility], [address] [place], [place] [address]    Special Requests   Final    BOTTLES DRAWN AEROBIC ONLY Blood Culture results may not be optimal due to an inadequate volume of blood received in culture bottles Performed at [facility], [address] [place], [place] [address]    Culture   Final    NO GROWTH 3 DAYS Performed at [facility], [address], [place], [place] [address]    Report Status PENDING  Incomplete  Culture, blood (Routine X 2) w Reflex to ID Panel     Status: None (Preliminary result)   Collection Time: 03/22/23  9:56 AM   Specimen: BLOOD RIGHT HAND  Result Value Ref Range Status   Specimen Description   Final    BLOOD RIGHT HAND Performed at [facility], [address], [place], [place] [address]    Special Requests   Final    BOTTLES DRAWN AEROBIC ONLY Blood Culture results may not be optimal due to an inadequate volume of blood received in culture bottles Performed at [facility], [address],  [place], [place] [address]    Culture   Final    NO GROWTH 3 DAYS Performed at [facility], [address] [place], [place] [address]    Report Status PENDING  Incomplete  Respiratory (~20 pathogens) panel by PCR     Status: Abnormal   Collection Time: 03/22/23  4:19 PM   Specimen: Nasopharyngeal Swab; Respiratory  Result Value Ref Range Status   Adenovirus NOT DETECTED NOT DETECTED Final   Coronavirus 229E NOT DETECTED NOT DETECTED Final    Comment: (NOTE) The Coronavirus on the Respiratory Panel, DOES NOT test for the novel  Coronavirus (2019 nCoV)    Coronavirus HKU1 NOT DETECTED NOT DETECTED Final   Coronavirus NL63 NOT DETECTED NOT DETECTED Final   Coronavirus OC43 NOT DETECTED NOT DETECTED Final   Metapneumovirus NOT DETECTED NOT DETECTED Final   Rhinovirus / Enterovirus NOT  DETECTED NOT DETECTED Final   Influenza A H1 2009 DETECTED (A) NOT DETECTED Final   Influenza B NOT DETECTED NOT DETECTED Final   Parainfluenza Virus 1 NOT DETECTED NOT DETECTED Final   Parainfluenza Virus 2 NOT DETECTED NOT DETECTED Final   Parainfluenza Virus 3 NOT DETECTED NOT DETECTED Final   Parainfluenza Virus 4 NOT DETECTED NOT DETECTED Final   Respiratory Syncytial Virus NOT DETECTED NOT DETECTED Final   Bordetella pertussis NOT DETECTED NOT DETECTED Final   Bordetella Parapertussis NOT DETECTED NOT DETECTED Final   Chlamydophila pneumoniae NOT DETECTED NOT DETECTED Final   Mycoplasma pneumoniae NOT DETECTED NOT DETECTED Final    Comment: Performed at [facility], [address], [place], [place] [address]     Labs: BNP (last 3 results) Recent Labs    03/21/23 0425  BNP 94.0   Basic Metabolic Panel: Recent Labs  Lab 03/21/23 0425 03/21/23 1721 03/22/23 0410 03/23/23 0437 03/24/23 0414  NA 137  --  138 139 139  K 4.0  --  5.2* 5.2* 4.2  CL 102  --  104 102 102  CO2 25  --  29 30 31   GLUCOSE 161*  --  138* 108* 118*  BUN 18  --  18 32* 21*  CREATININE 0.98 0.81 1.29*  1.24* 0.89  CALCIUM 8.8*  --  8.9 9.1 8.9  MG  --   --   --  2.5*  --   PHOS  --   --   --  4.1  --    Liver Function Tests: Recent Labs  Lab 03/21/23 0425 03/22/23 0410 03/23/23 0437  AST 28 25 25   ALT 25 25 27   ALKPHOS 81 72 72  BILITOT 0.5 0.4 0.6  PROT 6.7 6.7 6.3*  ALBUMIN 2.9* 3.0* 2.9*   No results for input(s): "LIPASE", "AMYLASE" in the last 168 hours. No results for input(s): "AMMONIA" in the last 168 hours. CBC: Recent Labs  Lab 03/21/23 0425 03/21/23 1721 03/22/23 0410 03/23/23 0437 03/24/23 0414  WBC 8.0 8.4 9.4 7.2 6.5  NEUTROABS 6.8  --   --  5.2  --   HGB 13.7 13.6 13.4 13.2 12.4  HCT 40.5 43.4 42.8 41.5 38.9  MCV 77.9* 83.0 84.3 83.3 81.9  PLT 238 229 233 236 221   Cardiac Enzymes: No results for input(s): "CKTOTAL", "CKMB", "CKMBINDEX", "TROPONINI" in the last 168 hours. BNP: Invalid input(s): "POCBNP" CBG: Recent Labs  Lab 03/24/23 0325 03/24/23 0736 03/24/23 1233 03/24/23 1616 03/25/23 0732  GLUCAP 80 113* 253* 162* 103*   D-Dimer No results for input(s): "DDIMER" in the last 72 hours. Hgb A1c No results for input(s): "HGBA1C" in the last 72 hours. Lipid Profile No results for input(s): "CHOL", "HDL", "LDLCALC", "TRIG", "CHOLHDL", "LDLDIRECT" in the last 72 hours. Thyroid function studies No results for input(s): "TSH", "T4TOTAL", "T3FREE", "THYROIDAB" in the last 72 hours.  Invalid input(s): "FREET3" Anemia work up No results for input(s): "VITAMINB12", "FOLATE", "FERRITIN", "TIBC", "IRON", "RETICCTPCT" in the last 72 hours. Urinalysis    Component Value Date/Time   COLORURINE YELLOW 01/31/2013 1138   APPEARANCEUR CLEAR 01/31/2013 1138   LABSPEC 1.029 01/31/2013 1138   PHURINE 6.0 01/31/2013 1138   GLUCOSEU NEG 01/31/2013 1138   HGBUR NEG 01/31/2013 1138   BILIRUBINUR NEG 01/31/2013 1138   KETONESUR NEG 01/31/2013 1138   PROTEINUR NEG 01/31/2013 1138   UROBILINOGEN 0.2 01/31/2013 1138   NITRITE NEG 01/31/2013 1138    LEUKOCYTESUR NEG 01/31/2013 1138  Sepsis Labs Recent Labs  Lab 03/21/23 1721 03/22/23 0410 03/23/23 0437 03/24/23 0414  WBC 8.4 9.4 7.2 6.5   Microbiology Recent Results (from the past 240 hours)  Resp panel by RT-PCR (RSV, Flu A&B, Covid) Anterior Nasal Swab     Status: Abnormal   Collection Time: 03/21/23  4:25 AM   Specimen: Anterior Nasal Swab  Result Value Ref Range Status   SARS Coronavirus 2 by RT PCR NEGATIVE NEGATIVE Final    Comment: (NOTE) SARS-CoV-2 target nucleic acids are NOT DETECTED.  The SARS-CoV-2 RNA is generally detectable in upper respiratory specimens during the acute phase of infection. The lowest concentration of SARS-CoV-2 viral copies this assay can detect is 138 copies/mL. A negative result does not preclude SARS-Cov-2 infection and should not be used as the sole basis for treatment or other patient management decisions. A negative result may occur with  improper specimen collection/handling, submission of specimen other than nasopharyngeal swab, presence of viral mutation(s) within the areas targeted by this assay, and inadequate number of viral copies(<138 copies/mL). A negative result must be combined with clinical observations, patient history, and epidemiological information. The expected result is Negative.  Fact Sheet for Patients:  BloggerCourse.com  Fact Sheet for Healthcare Providers:  SeriousBroker.it  This test is no t yet approved or cleared by the [place] FDA and  has been authorized for detection and/or diagnosis of SARS-CoV-2 by FDA under an Emergency Use Authorization (EUA). This EUA will remain  in effect (meaning this test can be used) for the duration of the COVID-19 declaration under Section 564(b)(1) of the Act, 21 U.S.C.section 360bbb-3(b)(1), unless the authorization is terminated  or revoked sooner.       Influenza A by PCR POSITIVE (A) NEGATIVE Final    Influenza B by PCR NEGATIVE NEGATIVE Final    Comment: (NOTE) The Xpert Xpress SARS-CoV-2/FLU/RSV plus assay is intended as an aid in the diagnosis of influenza from Nasopharyngeal swab specimens and should not be used as a sole basis for treatment. Nasal washings and aspirates are unacceptable for Xpert Xpress SARS-CoV-2/FLU/RSV testing.  Fact Sheet for Patients: BloggerCourse.com  Fact Sheet for Healthcare Providers: SeriousBroker.it  This test is not yet approved or cleared by the [place] FDA and has been authorized for detection and/or diagnosis of SARS-CoV-2 by FDA under an Emergency Use Authorization (EUA). This EUA will remain in effect (meaning this test can be used) for the duration of the COVID-19 declaration under Section 564(b)(1) of the Act, 21 U.S.C. section 360bbb-3(b)(1), unless the authorization is terminated or revoked.     Resp Syncytial Virus by PCR NEGATIVE NEGATIVE Final    Comment: (NOTE) Fact Sheet for Patients: BloggerCourse.com  Fact Sheet for Healthcare Providers: SeriousBroker.it  This test is not yet approved or cleared by the [place] FDA and has been authorized for detection and/or diagnosis of SARS-CoV-2 by FDA under an Emergency Use Authorization (EUA). This EUA will remain in effect (meaning this test can be used) for the duration of the COVID-19 declaration under Section 564(b)(1) of the Act, 21 U.S.C. section 360bbb-3(b)(1), unless the authorization is terminated or revoked.  Performed at [facility], [address] [place], [place] [address]   MRSA Next Gen by PCR, Nasal     Status: None   Collection Time: 03/21/23  7:41 AM   Specimen: Nasal Mucosa; Nasal Swab  Result Value Ref Range Status   MRSA by PCR Next Gen NOT DETECTED NOT DETECTED Final  Comment: (NOTE) The GeneXpert MRSA Assay (FDA approved for NASAL  specimens only), is one component of a comprehensive MRSA colonization surveillance program. It is not intended to diagnose MRSA infection nor to guide or monitor treatment for MRSA infections. Test performance is not FDA approved in patients less than 54 years old. Performed at [facility], [address], [place], [place] [address]   Expectorated Sputum Assessment w Gram Stain, Rflx to Resp Cult     Status: None   Collection Time: 03/22/23  9:05 AM   Specimen: Sputum  Result Value Ref Range Status   Specimen Description SPUTUM  Final   Special Requests NONE  Final   Sputum evaluation   Final    THIS SPECIMEN IS ACCEPTABLE FOR SPUTUM CULTURE Performed at [facility], [address] [place], [place] [address]    Report Status 03/22/2023 FINAL  Final  Culture, Respiratory w Gram Stain     Status: None   Collection Time: 03/22/23  9:05 AM   Specimen: SPU  Result Value Ref Range Status   Specimen Description   Final    SPUTUM Performed at [facility], [address], [place], [place] [address]    Special Requests   Final    NONE Reflexed from [phone] Performed at [facility], [address], [place], [place] [address]    Gram Stain   Final    NO WBC SEEN RARE GRAM POSITIVE COCCI IN PAIRS IN CLUSTERS RARE GRAM NEGATIVE RODS RARE GRAM POSITIVE RODS    Culture   Final    ABUNDANT Normal respiratory flora-no Staph aureus or Pseudomonas seen Performed at [facility], [address], [place], [place] [address]    Report Status 03/24/2023 FINAL  Final  Culture, blood (Routine X 2) w Reflex to ID Panel     Status: None (Preliminary result)   Collection Time: 03/22/23  9:51 AM   Specimen: BLOOD RIGHT HAND  Result Value Ref Range Status   Specimen Description   Final    BLOOD RIGHT HAND Performed at [facility], [address] [place], [place] [address]    Special Requests   Final     BOTTLES DRAWN AEROBIC ONLY Blood Culture results may not be optimal due to an inadequate volume of blood received in culture bottles Performed at [facility], [address] [place], [place] [address]    Culture   Final    NO GROWTH 3 DAYS Performed at [facility], [address], [place], [place] [address]    Report Status PENDING  Incomplete  Culture, blood (Routine X 2) w Reflex to ID Panel     Status: None (Preliminary result)   Collection Time: 03/22/23  9:56 AM   Specimen: BLOOD RIGHT HAND  Result Value Ref Range Status   Specimen Description   Final    BLOOD RIGHT HAND Performed at [facility], [address], [place], [place] [address]    Special Requests   Final    BOTTLES DRAWN AEROBIC ONLY Blood Culture results may not be optimal due to an inadequate volume of blood received in culture bottles Performed at [facility], [address] [place], [place] [address]    Culture   Final    NO GROWTH 3 DAYS Performed at [facility], [address], [place], [place] [address]    Report Status PENDING  Incomplete  Respiratory (~20 pathogens) panel by PCR     Status: Abnormal   Collection Time: 03/22/23  4:19 PM   Specimen: Nasopharyngeal Swab; Respiratory  Result Value Ref Range Status   Adenovirus NOT DETECTED NOT DETECTED Final   Coronavirus 229E NOT DETECTED NOT DETECTED Final    Comment: (NOTE) The Coronavirus on the Respiratory Panel, DOES NOT test for the novel  Coronavirus (2019 nCoV)    Coronavirus HKU1 NOT DETECTED NOT DETECTED Final   Coronavirus NL63 NOT DETECTED NOT DETECTED Final   Coronavirus OC43 NOT DETECTED NOT DETECTED Final   Metapneumovirus NOT DETECTED NOT DETECTED Final   Rhinovirus / Enterovirus NOT DETECTED NOT DETECTED Final   Influenza A H1 2009 DETECTED (A) NOT DETECTED Final   Influenza B NOT DETECTED NOT DETECTED Final   Parainfluenza Virus 1 NOT DETECTED NOT DETECTED Final    Parainfluenza Virus 2 NOT DETECTED NOT DETECTED Final   Parainfluenza Virus 3 NOT DETECTED NOT DETECTED Final   Parainfluenza Virus 4 NOT DETECTED NOT DETECTED Final   Respiratory Syncytial Virus NOT DETECTED NOT DETECTED Final   Bordetella pertussis NOT DETECTED NOT DETECTED Final   Bordetella Parapertussis NOT DETECTED NOT DETECTED Final   Chlamydophila pneumoniae NOT DETECTED NOT DETECTED Final   Mycoplasma pneumoniae NOT DETECTED NOT DETECTED Final    Comment: Performed at [facility], [address] [place], [place] [address]    Please note: You were cared for by a hospitalist during your hospital stay. Once you are discharged, your primary care physician will handle any further medical issues. Please note that NO REFILLS for any discharge medications will be authorized once you are discharged, as it is imperative that you return to your primary care physician (or establish a relationship with a primary care physician if you do not have one) for your post hospital discharge needs so that they can reassess your need for medications and monitor your lab values.    Time coordinating discharge: 40 minutes  SIGNED:   Burnadette Pop, MD  [facility] 03/25/2023, 11:45 AM Pager [phone]  If 7PM-7AM, please contact night-coverage [email or web address] Password [email or web address]

## 2023-03-25 NOTE — TOC Transition Note (Signed)
 Transition of Care [facility]) - Discharge Note   Patient Details  Name: Kaitlin [name] MRN: [id number] Date of Birth: 03-Aug-1965  [facility] of [facility] CM/SW Contact:  Larrie Kass, LCSW Phone Number: 03/25/2023, 11:53 AM   Clinical Narrative:    CSW met with pt to discuss rec for rolling walker. Pt has declined walker and has no other DME needs. Pt's family to transport pt home. No further TOC needs TOC sign off.         Patient Goals and CMS Choice            Discharge Placement                       Discharge Plan and Services Additional resources added to the After Visit Summary for                                       Social Drivers of Health (SDOH) Interventions SDOH Screenings   Food Insecurity: No Food Insecurity (03/21/2023)  Housing: Low Risk  (03/21/2023)  Transportation Needs: No Transportation Needs (03/21/2023)  Utilities: Not At Risk (03/21/2023)  Depression (PHQ2-9): Low Risk  (08/09/2022)  Financial Resource Strain: Medium Risk (08/18/2021)   [facility]13[facility] visits prior to 03/13/2022.  Physical Activity: Insufficiently Active (08/18/2021)   [facility] prior to 03/13/2022[facility] visits prior to 03/13/2022.  Social Connections: Moderately Isolated (03/21/2023)  Stress: No Stress Concern Present (08/18/2021)   [facility], [facility] prior to 03/13/2022., [facility] visits prior to 03/13/2022.  Tobacco Use: Medium Risk (03/21/2023)     Readmission Risk Interventions     No data to display

## 2023-03-25 NOTE — Care Management Important Message (Signed)
 Important Message  Patient Details IM Letter given. Name: TEALE GOODGAME MRN: [id number] Date of Birth: 1965/06/01   Important Message Given:  Yes - Medicare IM     Caren Macadam 03/25/2023, 2:39 PM

## 2023-03-25 NOTE — Progress Notes (Signed)
 Mobility Specialist - Progress Note  (RA) Pre-mobility: 107 bpm HR, 96% SpO2 During mobility: 134 bpm HR, 95% SpO2 Post-mobility: 136 bpm HR, 96% SPO2   03/25/23 0840  Mobility  Activity Ambulated with assistance in hallway  Level of Assistance Contact guard assist, steadying assist  Assistive Device Front wheel walker  Distance Ambulated (ft) 70 ft  Range of Motion/Exercises Active  Activity Response Tolerated well  Mobility Referral Yes  Mobility visit 1 Mobility  Mobility Specialist Start Time (ACUTE ONLY) 0830  Mobility Specialist Stop Time (ACUTE ONLY) 0840  Mobility Specialist Time Calculation (min) (ACUTE ONLY) 10 min   Pt was found on recliner chair and agreeable to ambulate. Grew fatigued with session. At EOS returned to recliner chair with all needs met. Call bell in reach and RN in room.  Billey Chang Mobility Specialist

## 2023-03-27 LAB — CULTURE, BLOOD (ROUTINE X 2)
Culture: NO GROWTH
Culture: NO GROWTH

## 2023-07-14 ENCOUNTER — Encounter: Payer: Self-pay | Admitting: Physician Assistant

## 2023-07-14 ENCOUNTER — Other Ambulatory Visit: Payer: Self-pay | Admitting: Physical Medicine and Rehabilitation

## 2023-07-14 ENCOUNTER — Other Ambulatory Visit: Payer: Self-pay | Admitting: Physician Assistant

## 2023-07-14 DIAGNOSIS — Z1231 Encounter for screening mammogram for malignant neoplasm of breast: Secondary | ICD-10-CM

## 2023-07-26 ENCOUNTER — Ambulatory Visit

## 2023-08-16 ENCOUNTER — Ambulatory Visit

## 2023-09-08 ENCOUNTER — Ambulatory Visit
Admission: RE | Admit: 2023-09-08 | Discharge: 2023-09-08 | Disposition: A | Discharge status: Home / Self Care | Source: Ambulatory Visit | Attending: Physician Assistant | Admitting: Physician Assistant

## 2023-09-08 DIAGNOSIS — Z1231 Encounter for screening mammogram for malignant neoplasm of breast: Secondary | ICD-10-CM

## 2023-09-27 ENCOUNTER — Other Ambulatory Visit: Payer: Self-pay | Admitting: Physical Medicine and Rehabilitation
